# Patient Record
Sex: Male | Born: 1950
Health system: Southern US, Community
[De-identification: ages and names within clinical notes are randomized; demographics above are authoritative.]

## PROBLEM LIST (undated history)

## (undated) DIAGNOSIS — R011 Cardiac murmur, unspecified: Secondary | ICD-10-CM

## (undated) DIAGNOSIS — C801 Malignant (primary) neoplasm, unspecified: Secondary | ICD-10-CM

## (undated) DIAGNOSIS — M1712 Unilateral primary osteoarthritis, left knee: Secondary | ICD-10-CM

## (undated) DIAGNOSIS — E162 Hypoglycemia, unspecified: Secondary | ICD-10-CM

## (undated) DIAGNOSIS — I5020 Unspecified systolic (congestive) heart failure: Secondary | ICD-10-CM

## (undated) DIAGNOSIS — C61 Malignant neoplasm of prostate: Secondary | ICD-10-CM

## (undated) DIAGNOSIS — I10 Essential (primary) hypertension: Secondary | ICD-10-CM

## (undated) DIAGNOSIS — I499 Cardiac arrhythmia, unspecified: Secondary | ICD-10-CM

## (undated) DIAGNOSIS — D649 Anemia, unspecified: Secondary | ICD-10-CM

## (undated) DIAGNOSIS — F40240 Claustrophobia: Secondary | ICD-10-CM

## (undated) DIAGNOSIS — M069 Rheumatoid arthritis, unspecified: Secondary | ICD-10-CM

## (undated) DIAGNOSIS — F32A Depression, unspecified: Secondary | ICD-10-CM

## (undated) DIAGNOSIS — I209 Angina pectoris, unspecified: Secondary | ICD-10-CM

## (undated) HISTORY — DX: Essential (primary) hypertension: I10

## (undated) HISTORY — DX: Unilateral primary osteoarthritis, left knee: M17.12

---

## 2003-06-11 ENCOUNTER — Emergency Department (HOSPITAL_COMMUNITY): Admission: EM | Admit: 2003-06-11 | Discharge: 2003-06-11 | Payer: Self-pay | Admitting: Emergency Medicine

## 2007-03-08 ENCOUNTER — Emergency Department (HOSPITAL_COMMUNITY): Admission: EM | Admit: 2007-03-08 | Discharge: 2007-03-08 | Payer: Self-pay | Admitting: Emergency Medicine

## 2007-03-13 ENCOUNTER — Encounter (HOSPITAL_BASED_OUTPATIENT_CLINIC_OR_DEPARTMENT_OTHER): Payer: Self-pay | Admitting: General Surgery

## 2007-03-13 ENCOUNTER — Observation Stay (HOSPITAL_COMMUNITY): Admission: RE | Admit: 2007-03-13 | Discharge: 2007-03-13 | Payer: Self-pay | Admitting: General Surgery

## 2007-03-13 IMAGING — CR DG CHEST 2V
2 series · 2 of 2 positions shown · non-contrast
Comparison: None.

CLINICAL DATA: Pre-op for inguinal hernia. 
 CHEST ? 2 VIEW:

[view not recorded (1 of 2)]
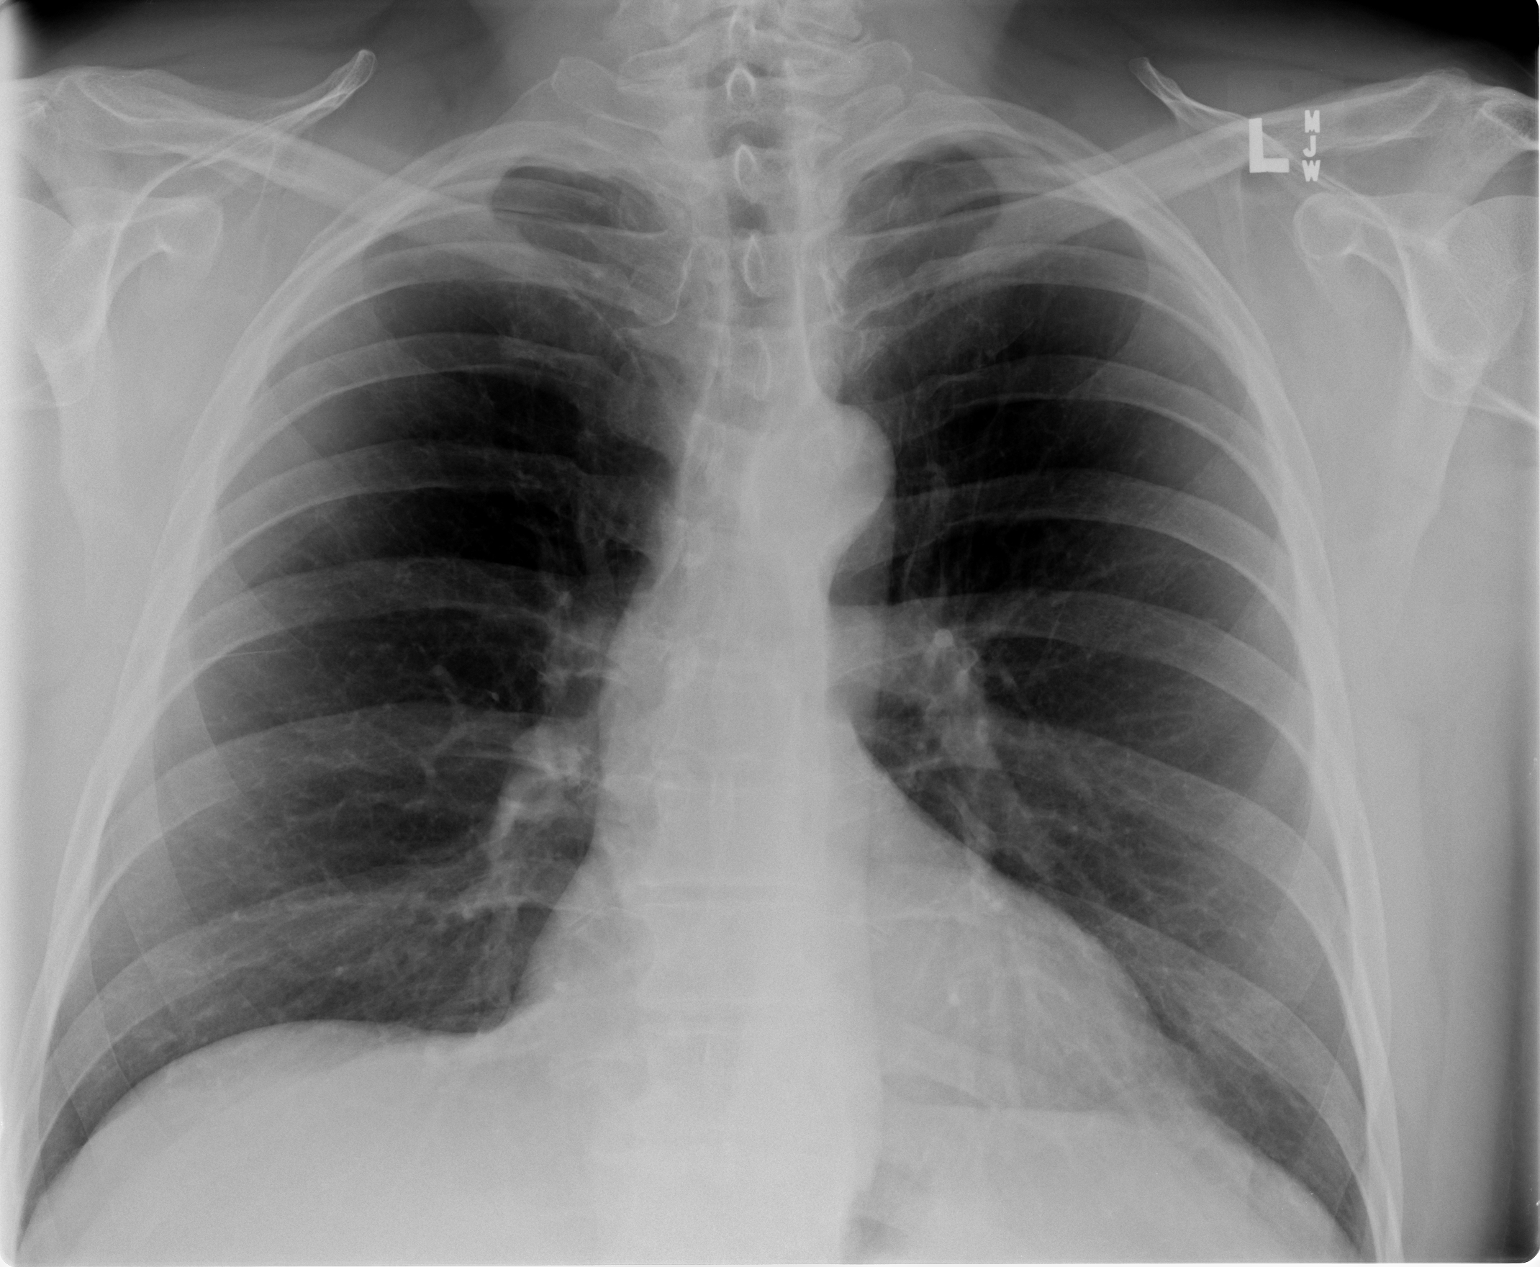

[view not recorded (2 of 2)]
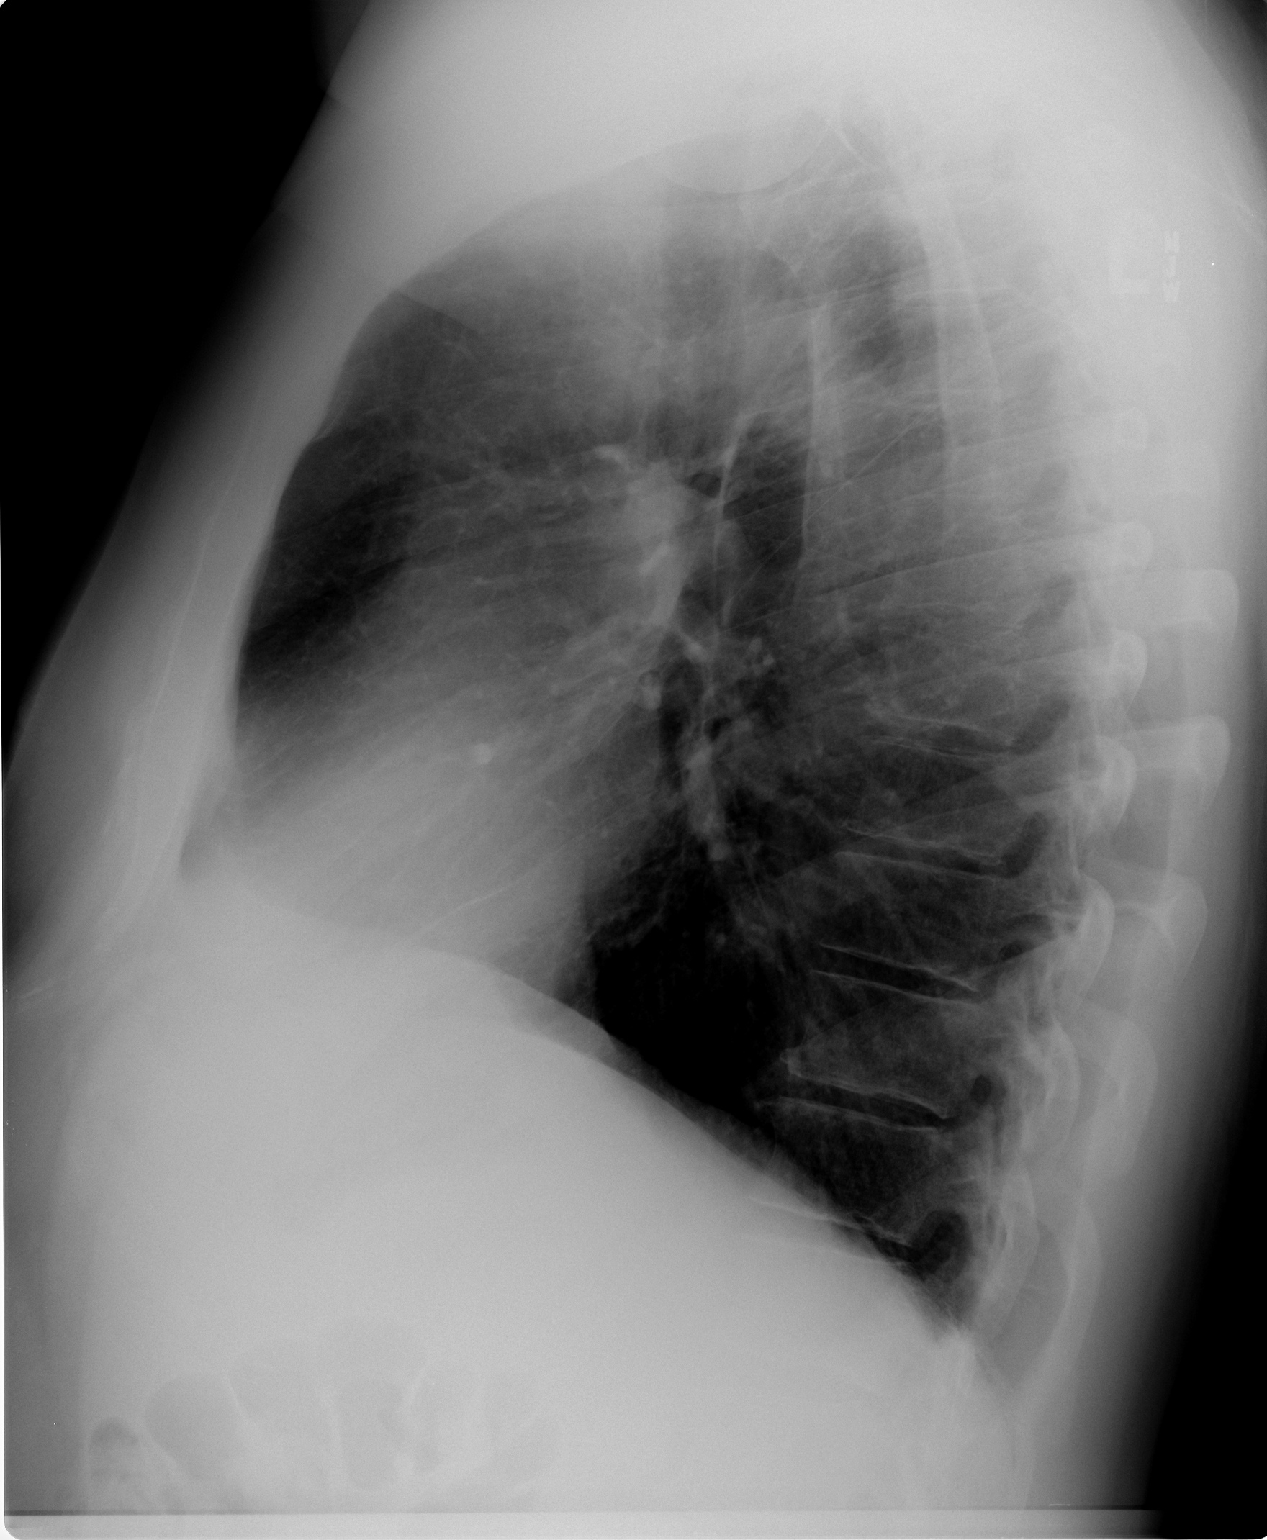

[2 of 2 positions shown; findings below may reference images not displayed]

FINDINGS: The cardiomediastinal silhouette is unremarkable.  There is no acute infiltrate or pleural effusion.  The bony structures are unremarkable.
IMPRESSION: No acute cardiopulmonary disease.

## 2007-04-25 HISTORY — PX: HERNIA REPAIR: SHX51

## 2009-08-29 ENCOUNTER — Emergency Department (HOSPITAL_COMMUNITY): Admission: EM | Admit: 2009-08-29 | Discharge: 2009-08-29 | Payer: Self-pay | Admitting: Emergency Medicine

## 2009-10-03 ENCOUNTER — Emergency Department (HOSPITAL_COMMUNITY): Admission: EM | Admit: 2009-10-03 | Discharge: 2009-10-03 | Payer: Self-pay | Admitting: Emergency Medicine

## 2009-12-27 ENCOUNTER — Emergency Department (HOSPITAL_COMMUNITY): Admission: EM | Admit: 2009-12-27 | Discharge: 2009-12-27 | Payer: Self-pay | Admitting: Emergency Medicine

## 2010-02-06 ENCOUNTER — Emergency Department (HOSPITAL_COMMUNITY): Admission: EM | Admit: 2010-02-06 | Discharge: 2010-02-06 | Payer: Self-pay | Admitting: Emergency Medicine

## 2010-03-01 ENCOUNTER — Ambulatory Visit: Payer: Self-pay | Admitting: Family Medicine

## 2010-03-01 DIAGNOSIS — H6980 Other specified disorders of Eustachian tube, unspecified ear: Secondary | ICD-10-CM | POA: Insufficient documentation

## 2010-03-01 DIAGNOSIS — F40298 Other specified phobia: Secondary | ICD-10-CM | POA: Insufficient documentation

## 2010-03-06 ENCOUNTER — Emergency Department (HOSPITAL_COMMUNITY): Admission: EM | Admit: 2010-03-06 | Discharge: 2010-03-06 | Payer: Self-pay | Admitting: Emergency Medicine

## 2010-03-06 IMAGING — CR DG FINGER THUMB 2+V*L*
3 series · 3 of 3 positions shown · non-contrast
Comparison: None.

CLINICAL DATA: Left thumb injury.

LEFT THUMB 2+V

[x finger pa left]
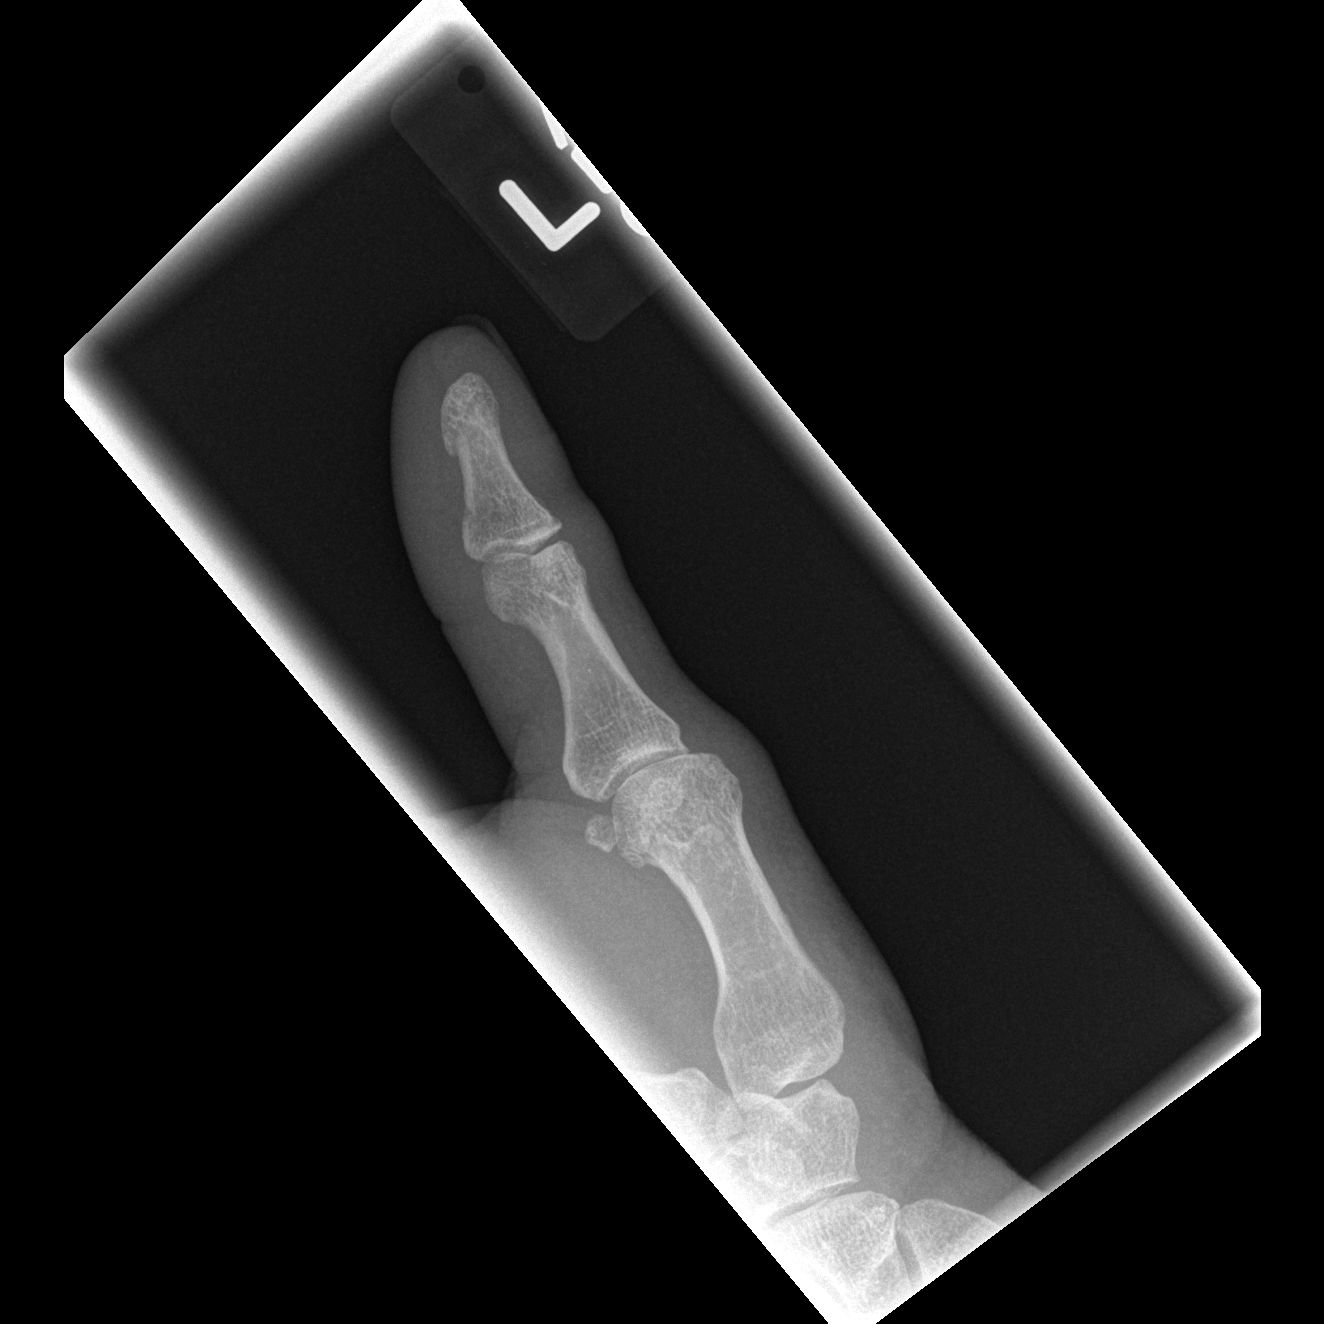

[x finger obl. left]
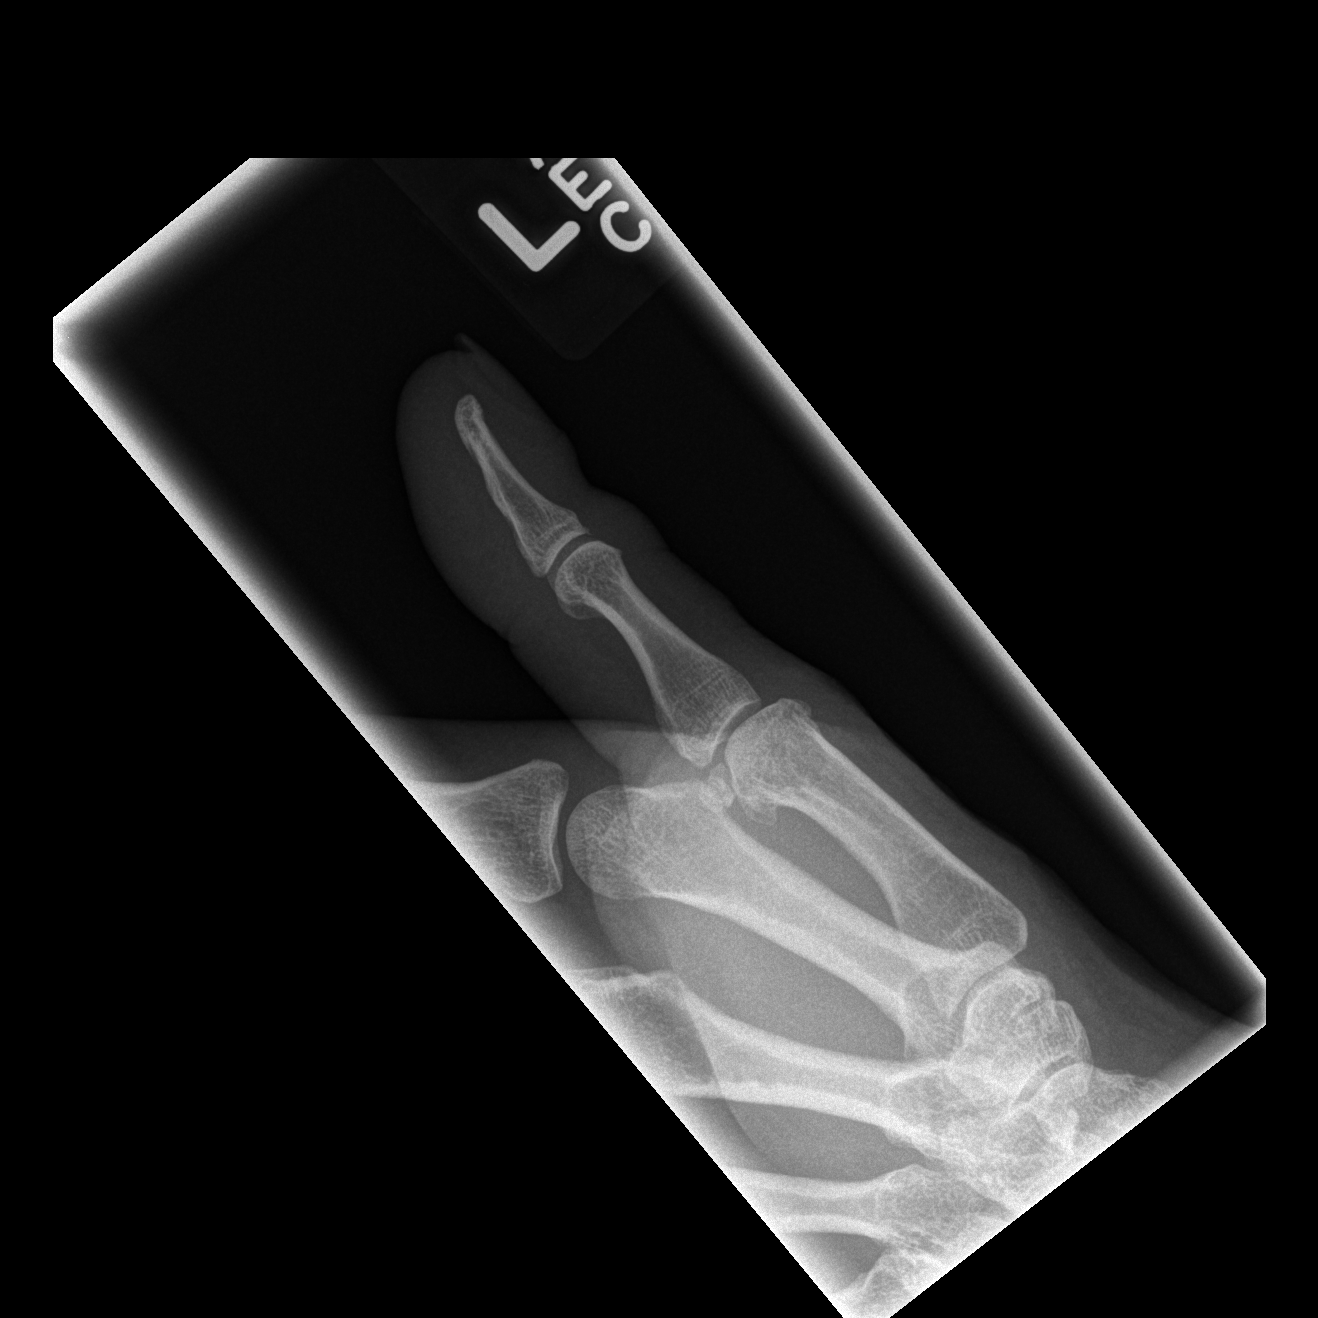

[x finger lateral left]
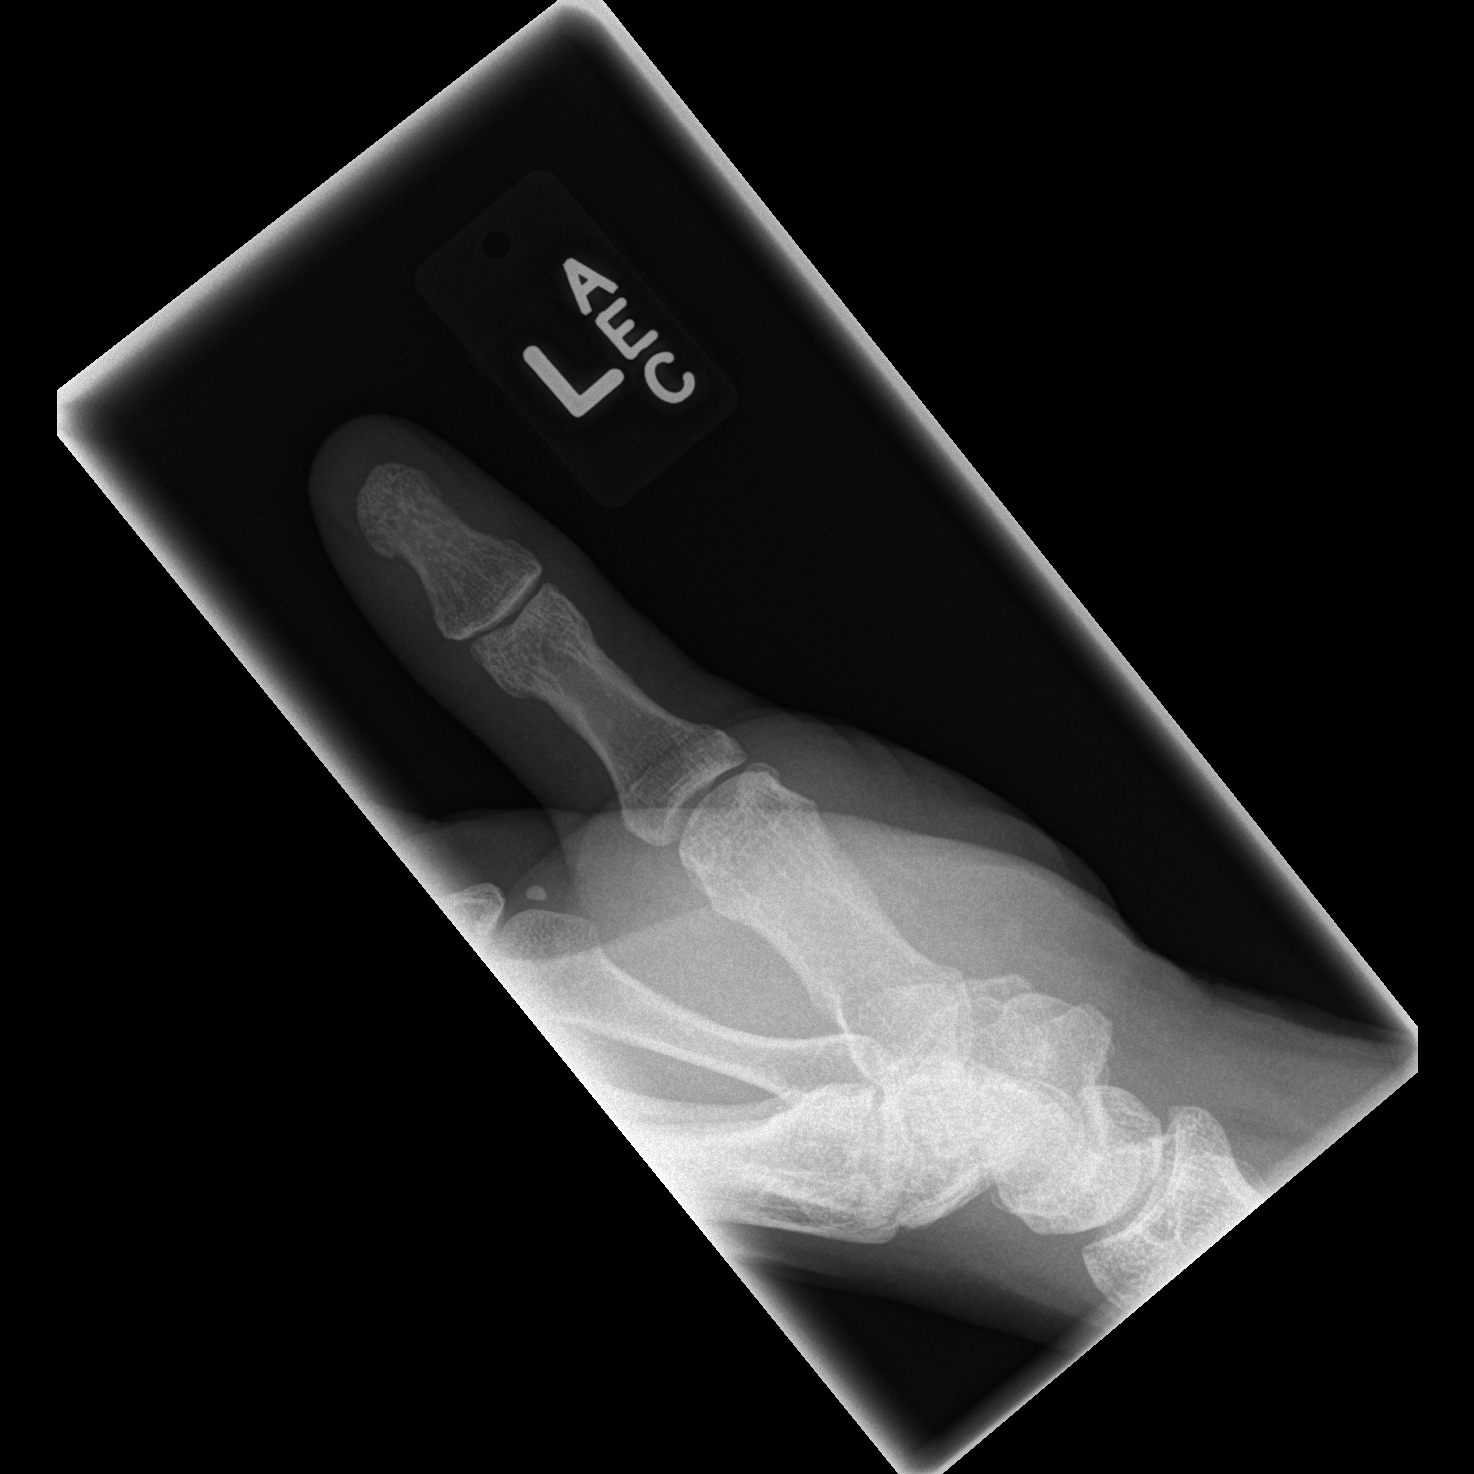

[3 of 3 positions shown; findings below may reference images not displayed]

FINDINGS: Three views of the left thumb were obtained.  Normal
alignment of the thumb.  Negative for acute fracture or
dislocation.
IMPRESSION: Negative radiographs of the left thumb.

## 2010-03-15 ENCOUNTER — Ambulatory Visit: Payer: Self-pay | Admitting: Family Medicine

## 2010-03-15 ENCOUNTER — Encounter: Payer: Self-pay | Admitting: *Deleted

## 2010-04-14 ENCOUNTER — Encounter: Payer: Self-pay | Admitting: Family Medicine

## 2010-04-17 ENCOUNTER — Emergency Department (HOSPITAL_COMMUNITY)
Admission: EM | Admit: 2010-04-17 | Discharge: 2010-04-17 | Payer: Self-pay | Source: Home / Self Care | Admitting: Emergency Medicine

## 2010-04-19 ENCOUNTER — Telehealth: Payer: Self-pay | Admitting: *Deleted

## 2010-04-27 ENCOUNTER — Encounter: Payer: Self-pay | Admitting: Family Medicine

## 2010-05-24 NOTE — Assessment & Plan Note (Signed)
Summary: ear prob,df   Vital Signs:  Patient profile:   60 year old male Weight:      254.3 pounds Temp:     97.9 degrees F oral Pulse rate:   92 / minute BP sitting:   144 / 90  (right arm)  Vitals Entered By: Arlyss Repress CMA, (March 15, 2010 9:34 AM) CC: f/up ear pressure. Is Patient Diabetic? No Pain Assessment Patient in pain? no        CC:  f/up ear pressure.Marland Kitchen  History of Present Illness: Return with continued feeling of blockage in his ears, it is driving him crazy,  He has used the nasal steroid and initially had some improvement.  He has stopped the Sudafed.  He denies fever or cough.  He would like me to flush out his ears, but the canals are clear and this was explained.  I addressed his anxiety and he endorses such but does not want treatment.  Recently lost his job and was aquainted with a recent mass homicide in [place].  Habits & Providers  Alcohol-Tobacco-Diet     Tobacco Status: never  Current Medications (verified): 1)  Flonase 50 Mcg/act Susp (Fluticasone Propionate) .... 2 Squirts in Each Nostril Daily  Allergies (verified): No Known Drug Allergies  Physical Exam  General:  Very tall, and stands during the encounter except for the ENT exam.  He cannot stand for the door to the room to be closed. Ears:  TMs are bacially grey, with normal light reflex, the left showed some retraction and injection around the bony landmark Nose:  patent, non inflammed turbs Mouth:  milky post nasal drainage, otherwise normal Psych:  good eye contact and moderately anxious.     Impression & Recommendations:  Problem # 1:  EUSTACHIAN TUBE DYSFUNCTION (ICD-381.81) Trial of nasal steroid, still having bothersome symptoms, requested ENT referral.  refused flu shot Orders: [facility]- Est Level  2 (16109) ENT Referral (ENT)  Problem # 2:  CLAUSTROPHOBIA (ICD-300.29) Addressed his anxiety, this was not his focus today.    Complete Medication List: 1)  Flonase 50  Mcg/act Susp (Fluticasone propionate) .... 2 squirts in each nostril daily  Patient Instructions: 1)  Please schedule a follow-up appointment as needed .  2)  We will call you with your ENT apt   Orders Added: 1)  [facility]- Est Level  2 [99212] 2)  ENT Referral [ENT]

## 2010-05-24 NOTE — Assessment & Plan Note (Signed)
Summary: np, df   Vital Signs:  Patient profile:   60 year old male Height:      80 inches Weight:      256 pounds BMI:     28.22 Pulse rate:   86 / minute BP sitting:   133 / 82  (right arm) Cuff size:   large  Vitals Entered By: Tessie Fass CMA (March 01, 2010 9:08 AM) CC: NEW PT Is Patient Diabetic? No Pain Assessment Patient in pain? no        CC:  NEW PT.  History of Present Illness: New patient, only problem that he wanted to discuss with the fluid in his ears.  He went to [facility] on two occasions for fullness in his ears.  The first time he was given antibiotics and the second time he was told to use sudafed.  He has been using sudafed 4 times a day.  He is very clausterphobic and would not let me shut the door and kept standing.  High anxiety as he sat in the room.  He would not discuss.  Has not had a primary MD, goes to urgent care as needed.  Habits & Providers  Alcohol-Tobacco-Diet     Tobacco Status: never  Current Medications (verified): 1)  Flonase 50 Mcg/act Susp (Fluticasone Propionate) .... 2 Squirts in Each Nostril Daily  Allergies (verified): No Known Drug Allergies  Past History:  Past Medical History: no chronic medical problems  Past Surgical History: never had surgery  Family History: Mother heart disease, alive at 62 Father deceased at 47 Prostate cancer and diabetes in familty  Social History: Married one son Has a Nurse, mental health and Statistician from college Unemployed no tabacco, no alcohol, no recreational drug use Exercises, work on his farm always wears seat beltsSmoking Status:  never  Physical Exam  General:  Standing, very tall, very anxious Ears:  TM retracted bilaterally Nose:  mildly inflammed turbs, good air movement Mouth:  white milky post nasal drainage, othrwise normal exam Neck:  No deformities, masses, or tenderness noted. Lungs:  normal respiratory effort and normal breath sounds.   Heart:  normal rate and  regular rhythm.     Impression & Recommendations:  Problem # 1:  EUSTACHIAN TUBE DYSFUNCTION (ICD-381.81) add nasal steroid, instructed to begin to wean off oral decongestants in one week, give it 4-6 weeks if not improvement will refer.  May need long term use of nasal steroids, discussed this.  Problem # 2:  CLAUSTROPHOBIA (ICD-300.29) Very anxious, would not discuss any other health related issues except his ears.  Complete Medication List: 1)  Flonase 50 Mcg/act Susp (Fluticasone propionate) .... 2 squirts in each nostril daily  Patient Instructions: 1)  give this 4-6 weeks 2)  If this does not work will refer to ENT Prescriptions: FLONASE 50 MCG/ACT SUSP (FLUTICASONE PROPIONATE) 2 squirts in each nostril daily  #1 x 6   Entered and Authorized by:   Luretha Murphy NP   Signed by:   Luretha Murphy NP on 03/01/2010   Method used:   Electronically to        [facility] (retail)       [address]       [place], [place]  [address]       Ph: [phone]       Fax: [phone]   RxID:   [id number]    Orders Added: 1)  [facility]-  New Level 3 [99203]

## 2010-05-24 NOTE — Miscellaneous (Signed)
Summary: ENT Refferal  Clinical Lists Changes called pt lmvm. has appt with [facility] ENT 03/22/10 @ 2:50. Office is located [address] [employer], [address]. phone # [phone]. called pt and lmvm to call back. re: appt we have scheduled. Arlyss Repress CMA,  March 16, 2010 5:18 PM  called pt again lmvm to return call.Tessie Fass CMA  March 21, 2010 2:41 PM

## 2010-05-26 NOTE — Consult Note (Signed)
Summary: [facility]  [facility]   Imported By: Bradly Bienenstock 04/22/2010 09:14:51  _____________________________________________________________________  External Attachment:    Type:   Image     Comment:   External Document

## 2010-05-26 NOTE — Progress Notes (Signed)
Summary: Referral  Phone Note Call from Patient Call back at [phone]   Reason for Call: Talk to Nurse Summary of Call: pt was referred to ENT, pt said they arent doing anything for him, wants to be referred to Dr. Rexford Maus, MD in [facility] their number is [phone] & we may speak with Melietta.  Initial call taken by: Knox Royalty,  April 19, 2010 12:40 PM  Follow-up for Phone Call        scheduled appt with new ENT office per pt request.  Follow-up by: Tessie Fass CMA,  April 21, 2010 9:42 AM

## 2010-05-26 NOTE — Consult Note (Signed)
[facility]   Imported By: De Nurse 05/04/2010 15:05:59  _____________________________________________________________________  External Attachment:    Type:   Image     Comment:   External Document

## 2010-05-27 IMAGING — CR DG LUMBAR SPINE 2-3V
3 series · 3 of 3 positions shown · non-contrast
Comparison: None

CLINICAL DATA: Low back pain extending both legs.

LUMBAR SPINE - 2-3 VIEW

[view not recorded (1 of 3)]
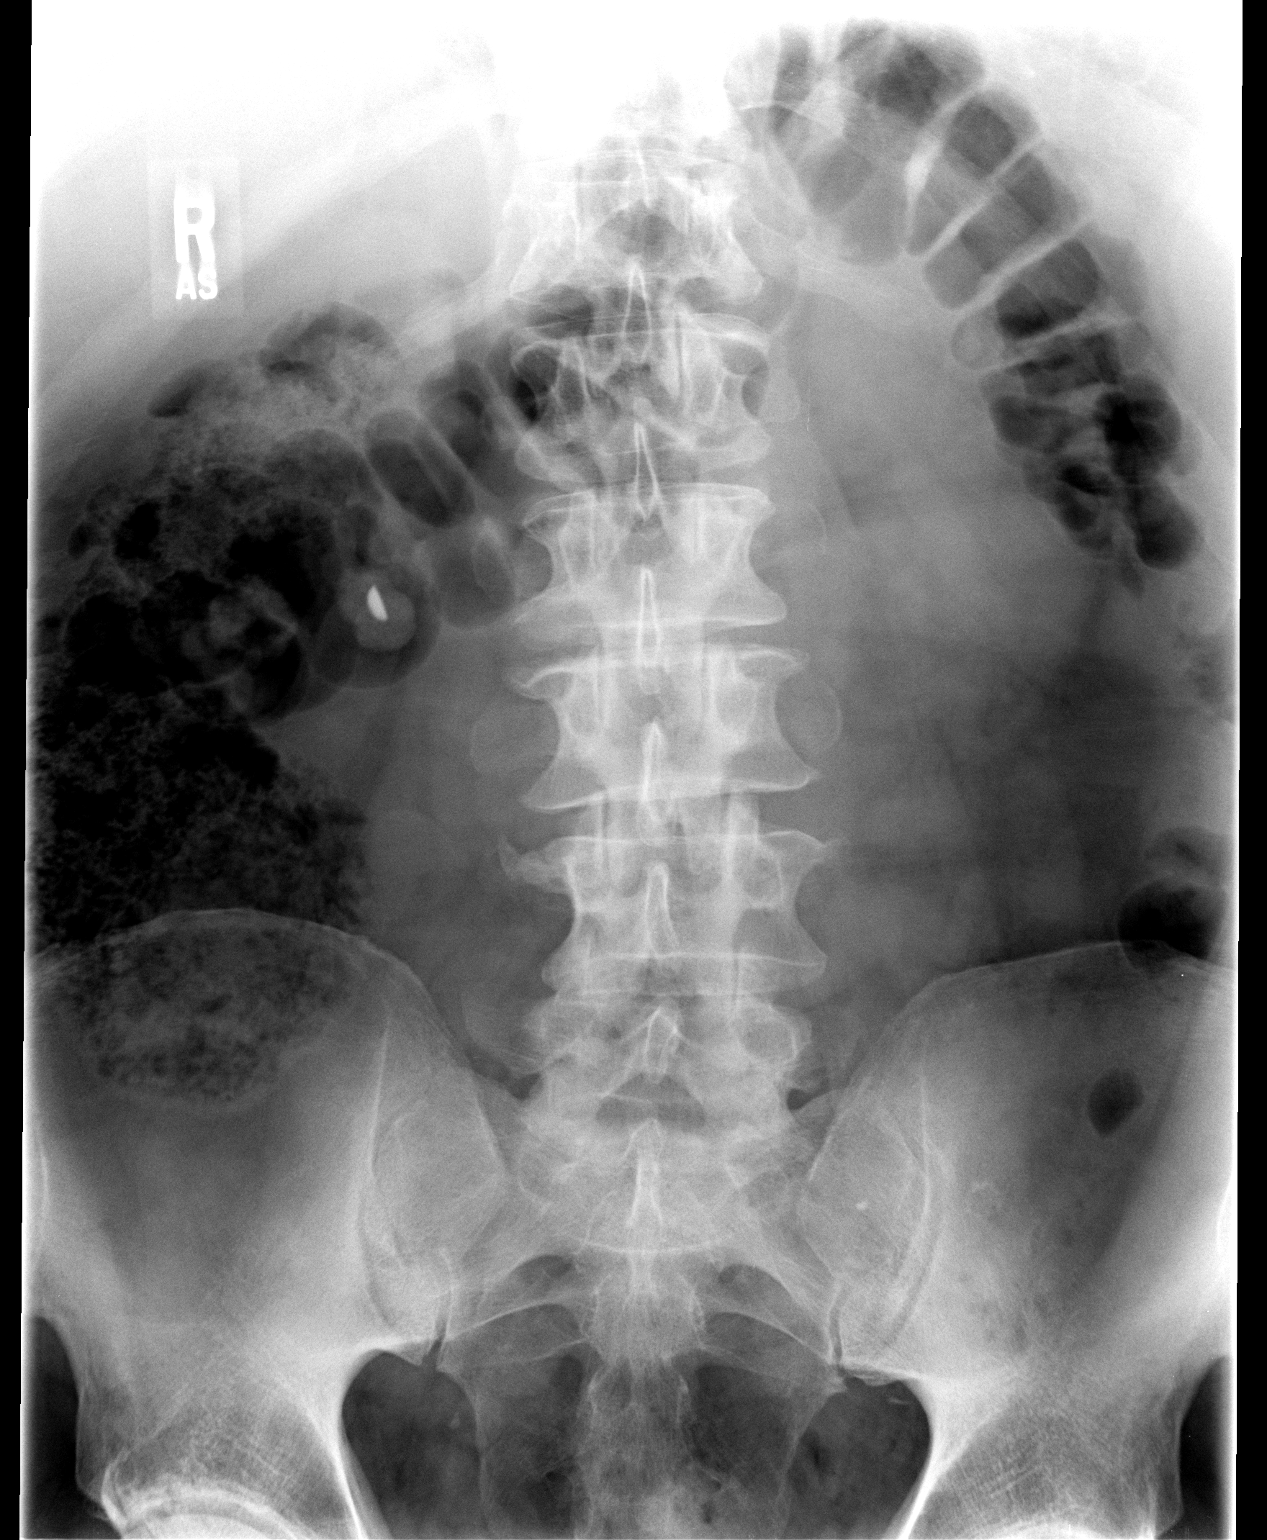

[view not recorded (2 of 3)]
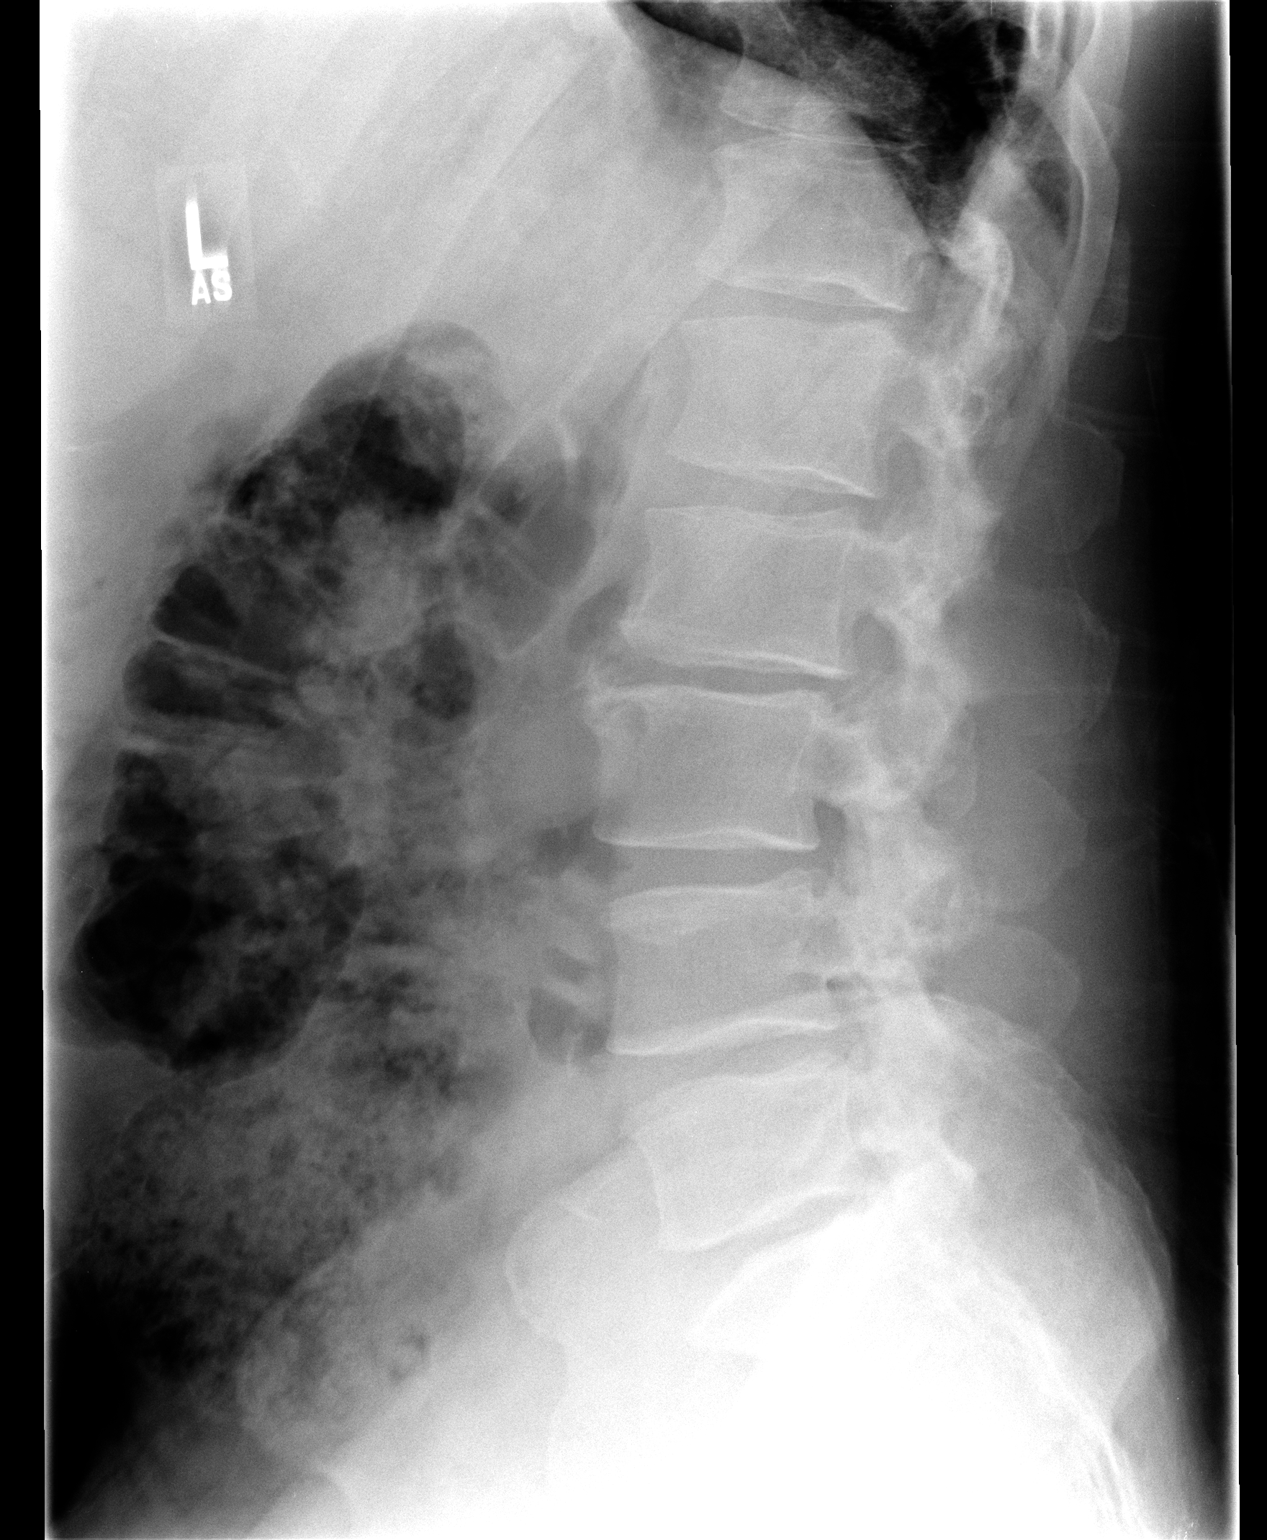

[view not recorded (3 of 3)]
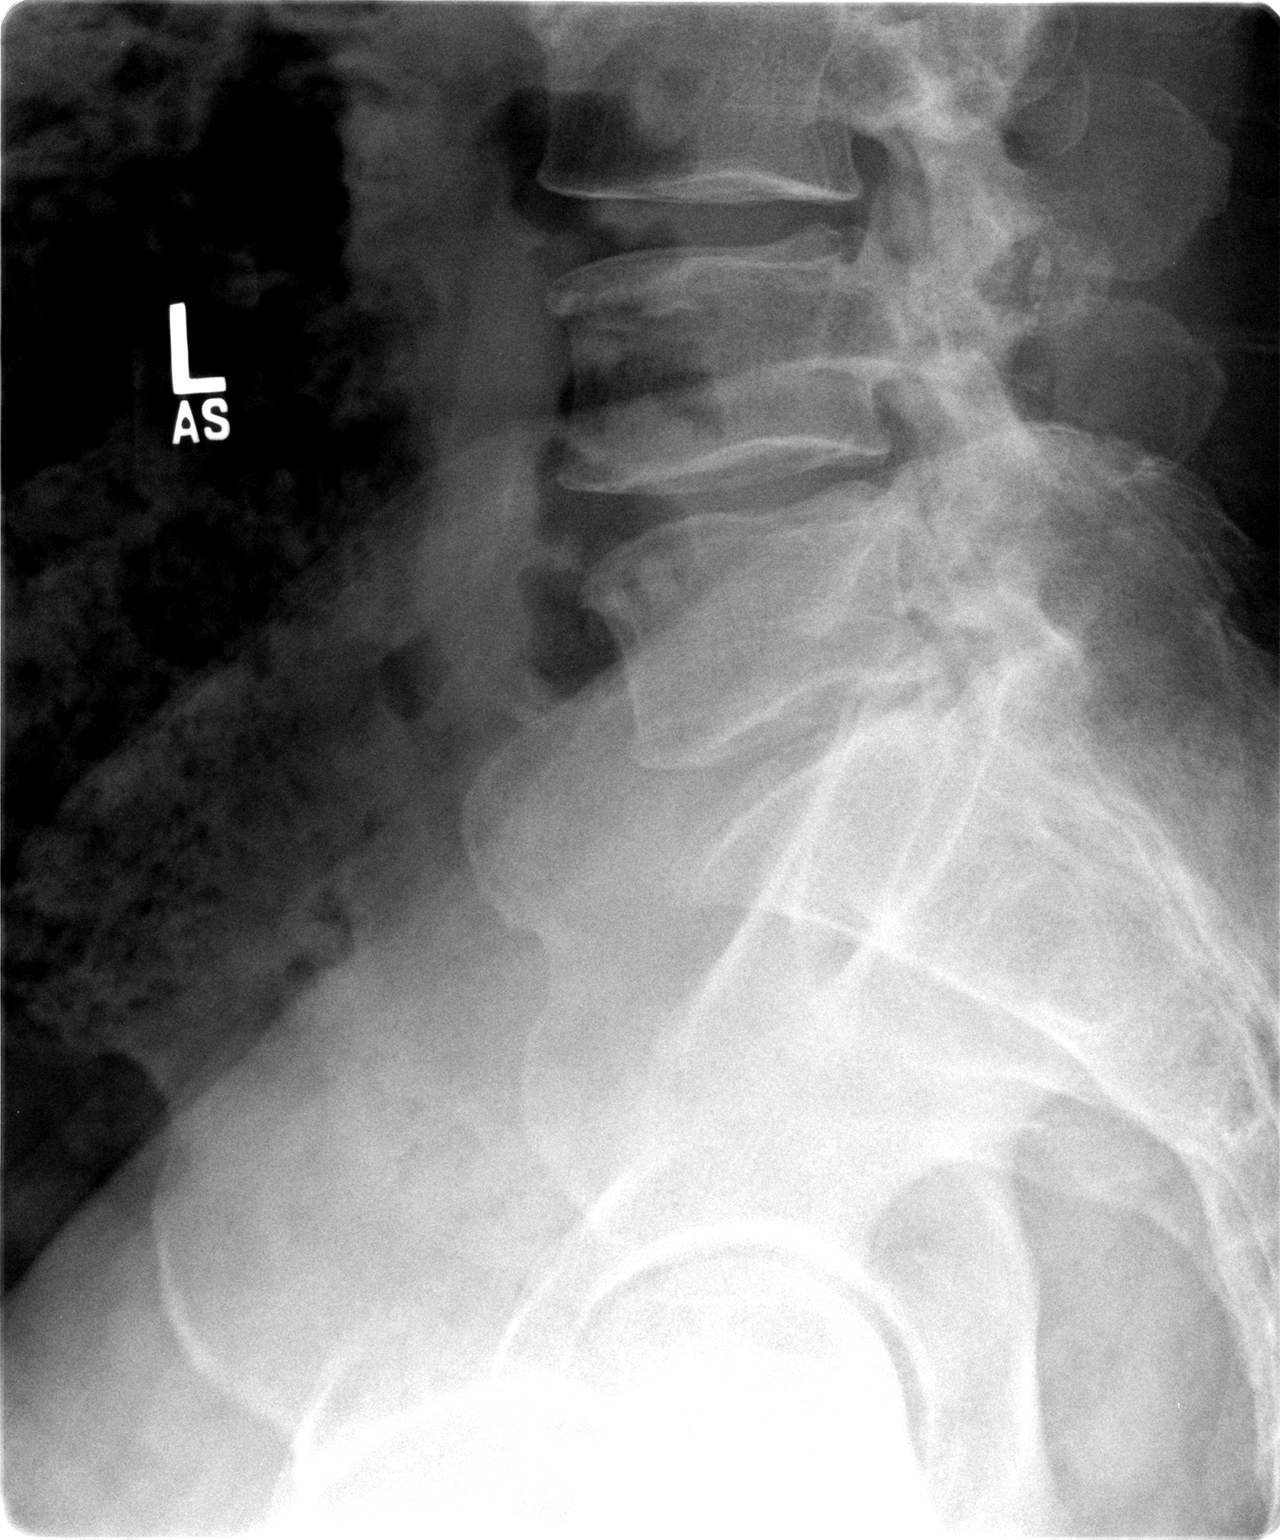

[3 of 3 positions shown; findings below may reference images not displayed]

FINDINGS: There is minimal spinal curvature.  There is mild disc
space narrowing at L2-3, L4-5 and L5-S1.  There is mild lower
lumbar facet degeneration.
IMPRESSION: Mild curvature.  Some degenerative disc disease and degenerative
facet disease in the lower lumbar region.

## 2010-06-02 ENCOUNTER — Encounter: Payer: Self-pay | Admitting: *Deleted

## 2010-08-23 ENCOUNTER — Encounter: Payer: Self-pay | Admitting: Family Medicine

## 2010-08-23 ENCOUNTER — Ambulatory Visit (INDEPENDENT_AMBULATORY_CARE_PROVIDER_SITE_OTHER): Payer: Medicaid Other | Admitting: Family Medicine

## 2010-08-23 VITALS — BP 155/97 | HR 75 | Temp 98.1°F | Ht 78.0 in | Wt 266.4 lb

## 2010-08-23 DIAGNOSIS — F40298 Other specified phobia: Secondary | ICD-10-CM

## 2010-08-23 DIAGNOSIS — E291 Testicular hypofunction: Secondary | ICD-10-CM

## 2010-08-23 DIAGNOSIS — E349 Endocrine disorder, unspecified: Secondary | ICD-10-CM

## 2010-08-23 LAB — COMPREHENSIVE METABOLIC PANEL
ALT: 31 U/L (ref 0–53)
AST: 24 U/L (ref 0–37)
Albumin: 4.4 g/dL (ref 3.5–5.2)
Alkaline Phosphatase: 76 U/L (ref 39–117)
BUN: 22 mg/dL (ref 6–23)
CO2: 24 mEq/L (ref 19–32)
Calcium: 9.4 mg/dL (ref 8.4–10.5)
Chloride: 103 mEq/L (ref 96–112)
Creat: 1.03 mg/dL (ref 0.40–1.50)
Glucose, Bld: 79 mg/dL (ref 70–99)
Potassium: 4.4 mEq/L (ref 3.5–5.3)
Sodium: 140 mEq/L (ref 135–145)
Total Bilirubin: 0.7 mg/dL (ref 0.3–1.2)
Total Protein: 7 g/dL (ref 6.0–8.3)

## 2010-08-23 LAB — CBC
HCT: 53.2 % — ABNORMAL HIGH (ref 39.0–52.0)
Hemoglobin: 18.4 g/dL — ABNORMAL HIGH (ref 13.0–17.0)
MCH: 31.2 pg (ref 26.0–34.0)
MCHC: 34.6 g/dL (ref 30.0–36.0)
MCV: 90.3 fL (ref 78.0–100.0)
Platelets: 210 10*3/uL (ref 150–400)
RBC: 5.89 MIL/uL — ABNORMAL HIGH (ref 4.22–5.81)
RDW: 12.7 % (ref 11.5–15.5)
WBC: 4.7 10*3/uL (ref 4.0–10.5)

## 2010-08-23 MED ORDER — TESTOSTERONE ENANTHATE 200 MG/ML IM SOLN: 200.0000 mg | INTRAMUSCULAR | Status: DC

## 2010-08-23 NOTE — Assessment & Plan Note (Signed)
Will get records from [place], and refill testosterone and get labs to make sure that he has no serious side effects.  Refilled script for him at this time.

## 2010-08-23 NOTE — Assessment & Plan Note (Signed)
He reports that this is a long standing problem and has not been made worse by testosterone replacement.

## 2010-08-23 NOTE — Patient Instructions (Signed)
I will send you a letter with the lab results Continue testosterone replacement as you have been Return as needed

## 2010-08-23 NOTE — Progress Notes (Signed)
  Subjective:    Patient ID: Thomas [name], male    DOB: 1950/05/04, 60 y.o.   MRN: [id number]  HPI  Patient previously started on testosterone replacement at [facility], he need a refill as he has been out for 2 weeks.  He notices a change in his energy level.  He is here for a refill.  Since he has been unemployed he has been coming here for visits.   He was referred to ENT for chronic ear complaints and was told that there was not identifiable cause he has learned to live with the discomfort.  Today he denies any adverse effects of testosterone replacement, we went through the list on Up to date.  I explained that this is a controlled substance and that we will need to follow his liver, kidney function and blood count.  His BP is elevated today, he has high anxiety when in a closed room secondary to severe claustrophobia.    Review of Systems  Constitutional: Negative for fatigue.  Cardiovascular: Negative for chest pain and leg swelling.  Genitourinary: Negative for difficulty urinating.       Reports last PSA 4 months ago   Neurological: Negative for headaches.  Hematological: Does not bruise/bleed easily.  Psychiatric/Behavioral: Negative for behavioral problems and agitation.       Objective:   Physical Exam  Constitutional: He appears distressed.       Well groomed, and developed.  Highly anxious in room with closed door.  Cardiovascular: Normal rate, regular rhythm and normal heart sounds.   Skin:       No brusing          Assessment & Plan:

## 2010-08-24 LAB — TESTOSTERONE, FREE, TOTAL, SHBG
Sex Hormone Binding: 28 nmol/L (ref 13–71)
Testosterone, Free: 29.9 pg/mL — ABNORMAL LOW (ref 47.0–244.0)
Testosterone-% Free: 2 % (ref 1.6–2.9)
Testosterone: 146.07 ng/dL — ABNORMAL LOW (ref 250–890)

## 2010-08-25 ENCOUNTER — Encounter: Payer: Self-pay | Admitting: Family Medicine

## 2010-08-25 DIAGNOSIS — IMO0001 Reserved for inherently not codable concepts without codable children: Secondary | ICD-10-CM | POA: Insufficient documentation

## 2010-08-25 DIAGNOSIS — N529 Male erectile dysfunction, unspecified: Secondary | ICD-10-CM | POA: Insufficient documentation

## 2010-08-25 DIAGNOSIS — R030 Elevated blood-pressure reading, without diagnosis of hypertension: Secondary | ICD-10-CM

## 2010-08-30 ENCOUNTER — Encounter: Payer: Self-pay | Admitting: Family Medicine

## 2010-09-06 NOTE — Op Note (Signed)
[name], [name]               ACCOUNT NO.:  [id number]   MEDICAL RECORD NO.:  [id number]          PATIENT TYPE:  INP   LOCATION:  2855                         FACILITY:  [facility]   PHYSICIAN:  Leonie Man, M.D.   DATE OF BIRTH:  1950-10-10   DATE OF PROCEDURE:  03/13/2007  DATE OF DISCHARGE:  03/13/2007                               OPERATIVE REPORT   PREOPERATIVE DIAGNOSIS:  Left inguinal hernia   POSTOPERATIVE DIAGNOSIS:  Left direct and indirect herniae.   PROCEDURE:  Left inguinal herniorrhaphy with mesh.   SURGEON:  Leonie Man, M.D.   ASSISTANT:  OR nurse.   ANESTHESIA:  General.   SPECIMENS TO THE LAB:  Hernia sac and cord lipoma.   ESTIMATED BLOOD LOSS:  Minimal.   COMPLICATIONS:  None.   DISPOSITION:  The patient returned to the PACU in excellent condition.   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  This has become larger and more  symptomatic over that period of time. He comes now to the operating room  after the risks and potential benefits of surgery have been fully  discussed and consent obtained for repair of a left sided inguinal  hernia.   PROCEDURE IN DETAIL:  With the positioned supinely and following the  induction of satisfactory general anesthesia, the left groin and lower  abdomen were prepped and draped to be included in a sterile operative  field.  Positive identification of the patient is made as Thomas [name]  and the side is a left sided inguinal hernia.  A transverse incision is  made in the lower abdominal crease, deepened through the skin and  subcutaneous tissues down to the external oblique aponeurosis.  The  external oblique aponeurosis was opened up to the external inguinal ring  and the spermatic cord is elevated and held with a Penrose drain. The  ilioinguinal nerve was not visualized.  Dissection down the anterior and  medial aspects of the cords, the large  hernia sac and lipoma were  dissected free from the cord contents and carried up to the internal  ring.  The lipoma was separated and ligated with 2-0 silks and forwarded  for pathologic evaluation.  The sac was opened. Intra-abdominal contents  within the sac were reduced back in the peritoneal cavity and a suture  ligature of 2-0 silk was placed in the base of the sac and the sac was  tied off. The sac was amputated and forward for pathologic evaluation.  Inspection of the inguinal floor showed a moderate sized direct inguinal  hernia.  This was repaired by an onlay patch of polypropylene mesh sewn  in at the pubic tubercle with 2-0 Novofil suture, carried up along the  conjoined tendon to the internal ring, and again, from the pubic  tubercle up along the shelving edge of Poupart's ligament to the  internal ring.  The mesh is split so as to allow the easy protrusion of  the spermatic cord between the leaflets of the mesh.  The leaflets of  the mesh were then sutured down behind the cord into the internal  oblique musculature.  The wound was irrigated.  Sponge, instrument and  sharp counts were verified.  The incision was closed as follows.  The  external oblique aponeurosis was closed with a running 2-0 Vicryl  suture, thereby, reapproximating the external inguinal ring.  The  subcutaneous tissues and Scarpa's fascia was closed with a running  suture of 3-0 Vicryl.  The skin was closed with a 4-0 Monocryl suture  and reinforced with Steri-Strips.  A sterile dressing was applied, the  anesthetic reversed, and the patient removed from the operating room to  the recovery room in stable condition.  He tolerated the procedure well.      Leonie Man, M.D.  Electronically Signed     PB/MEDQ  D:  03/13/2007  T:  03/13/2007  Job:  [id number]

## 2010-09-07 ENCOUNTER — Encounter: Payer: Self-pay | Admitting: Family Medicine

## 2010-09-07 NOTE — Progress Notes (Signed)
Letter mailed by Thomas [name] dated 08/30/10 - returned unopened.

## 2010-09-07 NOTE — Progress Notes (Signed)
°  Subjective:    Patient ID: Thomas [name], male    DOB: [birth year], 60 y.o.   MRN: [id number]  HPI    Review of Systems     Objective:   Physical Exam        Assessment & Plan:

## 2010-09-09 ENCOUNTER — Telehealth: Payer: Self-pay | Admitting: Family Medicine

## 2010-09-09 DIAGNOSIS — D751 Secondary polycythemia: Secondary | ICD-10-CM

## 2010-09-09 NOTE — Telephone Encounter (Signed)
Pt returned call - pls call cell# [phone]

## 2010-09-09 NOTE — Telephone Encounter (Signed)
Current dosage is 200 mg every week, he will decrease the dose to 100 mg every week recheck labs in 4 weeks after the change (next dose 5/23 recheck labs 6/25) and I will call him then.

## 2010-09-09 NOTE — Telephone Encounter (Signed)
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  Need to discuss options with patient.  Awaiting his return call.

## 2010-09-09 NOTE — Progress Notes (Signed)
Placed personal call to patient on 09/09/10, awaiting his return.  Serious complication of testosterone replacement, erythrocytosis.

## 2010-10-17 ENCOUNTER — Telehealth: Payer: Self-pay | Admitting: Family Medicine

## 2010-10-17 DIAGNOSIS — E349 Endocrine disorder, unspecified: Secondary | ICD-10-CM

## 2010-10-17 NOTE — Telephone Encounter (Signed)
Pt asking to speak with MD re: his labs she requested he have done & has another question about something they have discussed.

## 2010-10-18 NOTE — Telephone Encounter (Signed)
Forward to PCP for review

## 2010-10-19 NOTE — Telephone Encounter (Signed)
Patient reports decreased endurance at work and overall more fatigued since testosterone dosage was decreased to 200 mg.  This was done because of erythrocytosis.  Repeat CBC and testosterone levels to be done this week.  Will likely refer to hematology per Dr. Sheffield Slider to help.

## 2010-10-20 ENCOUNTER — Other Ambulatory Visit: Payer: Medicaid Other

## 2010-10-20 ENCOUNTER — Telehealth: Payer: Self-pay | Admitting: Family Medicine

## 2010-10-20 DIAGNOSIS — E349 Endocrine disorder, unspecified: Secondary | ICD-10-CM

## 2010-10-20 DIAGNOSIS — D751 Secondary polycythemia: Secondary | ICD-10-CM

## 2010-10-20 LAB — CBC
HCT: 50.3 % (ref 39.0–52.0)
Hemoglobin: 17.4 g/dL — ABNORMAL HIGH (ref 13.0–17.0)
MCH: 30.6 pg (ref 26.0–34.0)
MCHC: 34.6 g/dL (ref 30.0–36.0)
MCV: 88.6 fL (ref 78.0–100.0)
Platelets: 243 10*3/uL (ref 150–400)
RBC: 5.68 MIL/uL (ref 4.22–5.81)
RDW: 13.1 % (ref 11.5–15.5)
WBC: 7.2 10*3/uL (ref 4.0–10.5)

## 2010-10-20 NOTE — Progress Notes (Signed)
Cbc and testerone done today Thomas [name]

## 2010-10-20 NOTE — Telephone Encounter (Signed)
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

## 2010-10-21 ENCOUNTER — Encounter: Payer: Self-pay | Admitting: Family Medicine

## 2010-10-21 LAB — TESTOSTERONE, FREE, TOTAL, SHBG
Sex Hormone Binding: 31 nmol/L (ref 13–71)
Testosterone, Free: 98.7 pg/mL (ref 47.0–244.0)
Testosterone-% Free: 2.1 % (ref 1.6–2.9)
Testosterone: 459.59 ng/dL (ref 250–890)

## 2010-10-21 NOTE — Telephone Encounter (Signed)
Spoke with patient, his free testosterone level is normal on the 200 mg injection, and his HCT has normalized (high normal, but down).  He understands that the higher dose was probably giving him extra energy and stamina. He also understands the risk of erythrocytosis. Will continue current dose and he will re-adjust his expectations.

## 2010-12-06 ENCOUNTER — Encounter: Payer: Self-pay | Admitting: Family Medicine

## 2010-12-06 ENCOUNTER — Other Ambulatory Visit: Payer: Self-pay | Admitting: Family Medicine

## 2010-12-06 ENCOUNTER — Ambulatory Visit (INDEPENDENT_AMBULATORY_CARE_PROVIDER_SITE_OTHER): Payer: Medicaid Other | Admitting: Family Medicine

## 2010-12-06 VITALS — BP 163/110 | HR 80 | Temp 98.3°F | Wt 266.0 lb

## 2010-12-06 DIAGNOSIS — E349 Endocrine disorder, unspecified: Secondary | ICD-10-CM

## 2010-12-06 DIAGNOSIS — E291 Testicular hypofunction: Secondary | ICD-10-CM

## 2010-12-06 DIAGNOSIS — N529 Male erectile dysfunction, unspecified: Secondary | ICD-10-CM

## 2010-12-06 DIAGNOSIS — M21619 Bunion of unspecified foot: Secondary | ICD-10-CM

## 2010-12-06 DIAGNOSIS — M21629 Bunionette of unspecified foot: Secondary | ICD-10-CM | POA: Insufficient documentation

## 2010-12-06 DIAGNOSIS — H6980 Other specified disorders of Eustachian tube, unspecified ear: Secondary | ICD-10-CM

## 2010-12-06 DIAGNOSIS — F40298 Other specified phobia: Secondary | ICD-10-CM

## 2010-12-06 DIAGNOSIS — I10 Essential (primary) hypertension: Secondary | ICD-10-CM

## 2010-12-06 LAB — LDL CHOLESTEROL, DIRECT: Direct LDL: 94 mg/dL

## 2010-12-06 MED ORDER — SILDENAFIL CITRATE 100 MG PO TABS: 100.0000 mg | ORAL_TABLET | ORAL | Status: DC | PRN

## 2010-12-06 MED ORDER — FLUTICASONE PROPIONATE 50 MCG/ACT NA SUSP: 1.0000 | Freq: Every day | NASAL | Status: DC

## 2010-12-06 MED ORDER — LISINOPRIL 20 MG PO TABS: ORAL_TABLET | ORAL | Status: DC

## 2010-12-06 NOTE — Assessment & Plan Note (Signed)
BP way up today, will begin lisinopril 10 mg daily (20 mg tabs as I believe that he will need it), recheck in one month BP and labs

## 2010-12-06 NOTE — Assessment & Plan Note (Signed)
Erythrocytosis corrected but still  High normal, continue 200 mg twice weekely.  Check PSA next visit with labs.

## 2010-12-06 NOTE — Assessment & Plan Note (Signed)
Unwilling to increase dosage of testosterone as erythrocytosis is threatening for vascular events.  Will prescribe Viagra.

## 2010-12-06 NOTE — Assessment & Plan Note (Signed)
Reviewed importance of proper fitting shoes, pressure relief, podiatry if desired.

## 2010-12-06 NOTE — Progress Notes (Signed)
  Subjective:    Patient ID: Thomas [name], male    DOB: 1950-05-05, 60 y.o.   MRN: [id number]  HPI Painful right lateral foot at fifth MT tuberosity, just got new work shoes and helps some.  Does best when he does not wear shoes.  Has never worn a pain of sandles.  ED worse after decreasing dosage of testosterone to 200mg / ml twice weekly.  Testosterone levels were normal at that dosage and erythrocytosis returned to high normal.  Elevated BP has been long standing, he has never been on medications.  Discussed importance of starting such.  Right ear is roaring again, had full ENT evaluation with no pathology.  He did have some improvement with nasal steroids.   Review of Systems  Constitutional: Negative for activity change.  HENT: Negative for hearing loss and ear pain.        Pressure and roaring sound in right ear  Cardiovascular: Negative for chest pain.  Gastrointestinal: Negative for abdominal pain.  Genitourinary:       ED  Musculoskeletal:       Painful toe  Neurological: Negative for dizziness, light-headedness and headaches.  Hematological: Does not bruise/bleed easily.  Psychiatric/Behavioral: Negative for agitation.       Objective:   Physical Exam  Constitutional: He appears well-developed and well-nourished.  HENT:       Right TM retracted  Cardiovascular: Normal rate, regular rhythm and normal heart sounds.   Pulmonary/Chest: Effort normal and breath sounds normal.  Musculoskeletal:       Red, enlarged left firth tarsal tuberosity  Skin: Skin is warm and dry.  Psychiatric: He has a normal mood and affect.          Assessment & Plan:

## 2010-12-06 NOTE — Assessment & Plan Note (Signed)
Seemed much calmer in the room today with the door closed.

## 2010-12-06 NOTE — Assessment & Plan Note (Signed)
Resume nasal steroid.

## 2010-12-06 NOTE — Patient Instructions (Addendum)
Return in one month for labs and BP check       Bunion (Hallux Valgus)    A bony bump (protrusion) on the inside of the foot, at the base of the first toe, is called a bunion (hallux valgus). A bunion causes the first toe to angle toward the other toes.  SYMPTOMS  A bony bump on the inside of the foot, causing an outward turning of the first toe. It may also overlap the second toe.  Thickening of the skin (callus) over the bony bump.  Fluid buildup under the callus. Fluid may become red, tender, and swollen (inflamed) with constant irritation or pressure.  Foot pain and stiffness.   CAUSES  Many causes exist, including:  Inherited from your family (genetics).  Injury (trauma) forcing the first toe into a position in which it overlaps other toes.  Bunions are also associated with wearing shoes that have a narrow toe box (pointy shoes).   RISK INCREASES WITH   Family history of foot abnormalities, especially bunions.  Arthritis.  Narrow shoes, especially high heels.   PREVENTIVE MEASURES   Wear shoes with a wide toe box.  Avoid shoes with high heels.  Wear a small pad between the big toe and second toe.  Maintain proper conditioning: l Foot and ankle flexibility. l Muscle strength and endurance.   PROGNOSIS With proper treatment, bunions can typically be cured. Occasionally, surgery is required.    POSSIBLE COMPLICATIONS  l Infection of the bunion. l Arthritis of the first toe. l Risks of surgery, including infection, bleeding, injury to nerves (numb toe), recurrent bunion, overcorrection (toe points inward), arthritis of the big toe, big toe pointing upward, and bone not healing.   GENERAL TREATMENT CONSIDERATIONS Treatment first consists of stopping the activities that aggravate the pain, taking pain medicines, and icing to reduce inflammation and pain. Wear shoes with a wide toe box. Shoes can be modified by a shoe repair person to relieve pressure on  the bunion, especially if you cannot find shoes with a wide enough toe box. You may also place a pad with the center cut out in your shoe, to reduce pressure on the bunion. Sometimes, an arch support (orthotic) may reduce pressure on the bunion and alleviate the symptoms. Stretching and strengthening exercises for the muscles of the foot may be useful. You may choose to wear a brace or pad at night to hold the big toe away from the second toe. If non-surgical treatments are not successful, surgery may be needed. Surgery involves removing the overgrown tissue and correcting the position of the first toe, by realigning the bones. Bunion surgery is typically performed on an outpatient basis, meaning you can go home the same day as surgery. The surgery may involve cutting the mid portion of the bone of the first toe, or just cutting and repairing (reconstructing) the ligaments and soft tissues around the first toe.    MEDICATION   If pain medicine is needed, nonsteroidal anti-inflammatory medicines, such as aspirin and ibuprofen, or other minor pain relievers, such as acetaminophen, are often recommended.   Do not take pain medicine for 7 days before surgery.   Prescription pain relievers are usually only prescribed after surgery. Use only as directed and only as much as you need.  Ointments applied to the skin may be helpful.   HEAT AND COLD   Cold treatment (icing) relieves pain and reduces inflammation. Cold treatment should be applied for 10 to 15 minutes every 2  to 3 hours for inflammation and pain and immediately after any activity that aggravates your symptoms. Use ice packs or an ice massage.  Heat treatment may be used prior to performing the stretching and strengthening activities prescribed by your caregiver, physical therapist, or athletic trainer. Use a heat pack or a warm soak.   SEEK MEDICAL CARE IF:   Symptoms get worse or do not improve in 2 weeks, despite treatment.  After  surgery, you develop fever, increasing pain, redness, swelling, drainage of fluids, bleeding, or increasing warmth around the surgical area.  New, unexplained symptoms develop. (Drugs used in treatment may produce side effects.)   Document Released: 04/10/2005  Document Re-Released: 05/02/2009 [facility] Patient Information 2011 [place], [place].

## 2010-12-07 ENCOUNTER — Encounter: Payer: Self-pay | Admitting: Family Medicine

## 2010-12-07 NOTE — Telephone Encounter (Signed)
Refill request

## 2010-12-15 ENCOUNTER — Encounter: Payer: Self-pay | Admitting: Family Medicine

## 2010-12-15 ENCOUNTER — Other Ambulatory Visit: Payer: Self-pay | Admitting: Family Medicine

## 2010-12-15 ENCOUNTER — Ambulatory Visit (INDEPENDENT_AMBULATORY_CARE_PROVIDER_SITE_OTHER): Payer: Medicaid Other | Admitting: Family Medicine

## 2010-12-15 VITALS — BP 147/85 | HR 80 | Temp 97.9°F | Ht 78.0 in | Wt 265.0 lb

## 2010-12-15 DIAGNOSIS — F3289 Other specified depressive episodes: Secondary | ICD-10-CM

## 2010-12-15 DIAGNOSIS — H7190 Unspecified cholesteatoma, unspecified ear: Secondary | ICD-10-CM

## 2010-12-15 DIAGNOSIS — D751 Secondary polycythemia: Secondary | ICD-10-CM | POA: Insufficient documentation

## 2010-12-15 DIAGNOSIS — F329 Major depressive disorder, single episode, unspecified: Secondary | ICD-10-CM

## 2010-12-15 DIAGNOSIS — T887XXA Unspecified adverse effect of drug or medicament, initial encounter: Secondary | ICD-10-CM

## 2010-12-15 DIAGNOSIS — F32A Depression, unspecified: Secondary | ICD-10-CM

## 2010-12-15 DIAGNOSIS — H6980 Other specified disorders of Eustachian tube, unspecified ear: Secondary | ICD-10-CM

## 2010-12-15 DIAGNOSIS — F320 Major depressive disorder, single episode, mild: Secondary | ICD-10-CM

## 2010-12-15 DIAGNOSIS — G4700 Insomnia, unspecified: Secondary | ICD-10-CM

## 2010-12-15 LAB — BASIC METABOLIC PANEL
BUN: 19 mg/dL (ref 6–23)
CO2: 27 mEq/L (ref 19–32)
Calcium: 9.6 mg/dL (ref 8.4–10.5)
Chloride: 101 mEq/L (ref 96–112)
Creat: 1.03 mg/dL (ref 0.50–1.35)
Glucose, Bld: 76 mg/dL (ref 70–99)
Potassium: 4.5 mEq/L (ref 3.5–5.3)
Sodium: 140 mEq/L (ref 135–145)

## 2010-12-15 NOTE — Progress Notes (Signed)
Subjective:    Patient ID: Thomas [name], male    DOB: Mar 14, 1951, 60 y.o.   MRN: [id number]  HPI 1. Right Ear Pain Patient has chronic right ear pain associated with feeling of fullness. He has a retracted TM. He was seen by ENT who did not find any abnormalities. He has normal hearing. He has no sx of infection. He does not have a history of otitis media growing up. He has no foreign body present. He c/o ringing in the ear occasionally. His right external ear canal is very sensitive to water flush performed today at patients request. He has no fevers, weight loss, hearing loss, discharge.  2. Depression/Poor Sleep Scored 7 on the PHQ-9. 5-9 is Mild Depression. He complains of difficulty falling asleep and frequent waking throughout the night (without snoring or acid reflex/sob). Fatigue, loss of interest in daily activities. He is not on an antidepressant. He had some improvement in these symptoms when his testosterone was replaced. This has returned as his testosterone dose was decreased because of erythrocytosis side effect. No suicidality.  3. Erythrocytosis/testosterone supplementation side effect 17/50 h/h, slight decrease after his testosterone dose decreased. Per Luretha Murphy his CBC was normal prior to starting testosterone.   4. HTN 140/65 today - excellent response to ACEI. No headaches, no visual changes, no chest pain. Last BUN/CR normal.  Review of Systems  Constitutional: Positive for fatigue. Negative for fever, activity change, appetite change and unexpected weight change.  HENT: Positive for ear pain and tinnitus. Negative for hearing loss, congestion, rhinorrhea, sneezing, postnasal drip and ear discharge.   Eyes: Negative for visual disturbance.  Respiratory: Positive for choking. Negative for cough, chest tightness and shortness of breath.   Skin: Negative for rash.  Neurological: Negative for dizziness, light-headedness and headaches.  Psychiatric/Behavioral: Positive  for sleep disturbance. Negative for suicidal ideas and self-injury. The patient is not nervous/anxious.        PHQ-9: mild depression       Objective:   Physical Exam  Nursing note and vitals reviewed. Constitutional: He appears well-developed and well-nourished. No distress.  HENT:  Right Ear: There is tenderness. No drainage or swelling. No foreign bodies. No mastoid tenderness. Tympanic membrane is retracted. Tympanic membrane is not injected, not scarred, not perforated, not erythematous and not bulging. No middle ear effusion. No hemotympanum. No decreased hearing is noted.  Left Ear: Tympanic membrane, external ear and ear canal normal. No tenderness. No foreign bodies. Tympanic membrane is not injected, not scarred, not perforated, not erythematous, not retracted and not bulging.  Mouth/Throat: Oropharynx is clear and moist and mucous membranes are normal.  Skin: Skin is warm. No rash noted. He is not diaphoretic.  Psychiatric: He has a normal mood and affect. His behavior is normal. Thought content normal.      Assessment & Plan:  1. Right Ear Pain - Eustachian Tube Dysfunction with history of Middle ear effusion and chronic pain/pressure. No GERD/Allergies/Sinus symptoms that could be causes. - Will get CT Scan to evaluate for possible keratoma buildup - cholesteatoma  2. Depression/Poor Sleep Scored 7 on the PHQ-9. 5-9 is Mild Depression.  - will reevaluate next visit - consider starting sleeping aid medication - consider starting Antidepressant if no improvement in PHQ-9  3. Erythrocytosis/testosterone supplementation side effect - continue with current testosterone dose - will recheck CBC in 3 months - consider donating blood quarterly.  4. HTN 140/65 today - excellent response to ACEI.  - BMET today - Increase Lisinopril  dose

## 2010-12-15 NOTE — Patient Instructions (Signed)
Consider donating blood Increase lisinopril to a whole tab CT scan will be scheduled We will call you with the results Recommend exercising your ear by blowing into a closed nose

## 2010-12-16 ENCOUNTER — Encounter: Payer: Self-pay | Admitting: Family Medicine

## 2010-12-21 ENCOUNTER — Encounter: Payer: Self-pay | Admitting: Family Medicine

## 2011-01-04 ENCOUNTER — Telehealth: Payer: Self-pay | Admitting: Family Medicine

## 2011-01-04 NOTE — Telephone Encounter (Signed)
Pt states he will be out of town tomorrow and cannot make CT appt (it has been cancelled)- would like to speak with [place] before it is rescheduled.

## 2011-01-05 ENCOUNTER — Other Ambulatory Visit (HOSPITAL_COMMUNITY): Payer: Medicaid Other

## 2011-01-05 NOTE — Telephone Encounter (Signed)
Called, and left message to call back.

## 2011-01-13 ENCOUNTER — Ambulatory Visit (INDEPENDENT_AMBULATORY_CARE_PROVIDER_SITE_OTHER): Payer: Medicaid Other | Admitting: Family Medicine

## 2011-01-13 ENCOUNTER — Encounter: Payer: Self-pay | Admitting: Family Medicine

## 2011-01-13 VITALS — BP 145/87 | HR 89 | Wt 266.9 lb

## 2011-01-13 DIAGNOSIS — M159 Polyosteoarthritis, unspecified: Secondary | ICD-10-CM | POA: Insufficient documentation

## 2011-01-13 DIAGNOSIS — D751 Secondary polycythemia: Secondary | ICD-10-CM

## 2011-01-13 DIAGNOSIS — M545 Low back pain, unspecified: Secondary | ICD-10-CM

## 2011-01-13 DIAGNOSIS — I10 Essential (primary) hypertension: Secondary | ICD-10-CM

## 2011-01-13 DIAGNOSIS — M549 Dorsalgia, unspecified: Secondary | ICD-10-CM | POA: Insufficient documentation

## 2011-01-13 MED ORDER — TESTOSTERONE ENANTHATE 200 MG/ML IM SOLN: 200.0000 mg | INTRAMUSCULAR | Status: DC

## 2011-01-13 MED ORDER — HYDROCODONE-ACETAMINOPHEN 5-500 MG PO TABS: 1.0000 | ORAL_TABLET | Freq: Every day | ORAL | Status: DC

## 2011-01-13 NOTE — Assessment & Plan Note (Addendum)
Pain is at night as well as during the day, has to take OTC medications 4 times a day to get though day.  Past work up DJD. Prescribed Hydrocodone/APAP has used in the past, he will only use one or two daily.  He has never showed to have problems with controlled substances.

## 2011-01-13 NOTE — Progress Notes (Signed)
  Subjective:    Patient ID: Thomas [name], male    DOB: 1950-04-27, 60 y.o.   MRN: [id number]  HPI Low back pain for years.  Has been a stone mason for 44 years.  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 Pain present at night and throughout the day. It is getting worse.  He takes OTC medications 3-4 times a day.  He hates to admit it but after his Mother died she had hydrocodone and has used 1/2 in the AM and one at night for a short time, it really helped.  He was told in the past that he as degenerative disc disease.  Pain is in both buttocks and radiated down leg.  He does not have to bend over for relief.  He is tolerating the lisinopril with not side effects.     Review of Systems  Musculoskeletal: Positive for back pain.       Objective:   Physical Exam  Constitutional: He appears well-developed and well-nourished.  Musculoskeletal: Normal range of motion.       5/5 strength of LE          Assessment & Plan:

## 2011-01-13 NOTE — Patient Instructions (Signed)
If your back pain is not controlled return to see Dr. Deirdre Priest It would be good to see him int he next 3 months

## 2011-01-13 NOTE — Assessment & Plan Note (Signed)
Tolerating ACEI, BP not quite at goal but much close

## 2011-01-16 ENCOUNTER — Other Ambulatory Visit: Payer: Self-pay | Admitting: Family Medicine

## 2011-01-16 MED ORDER — "SYRINGE 22G X 1"" 3 ML MISC": 1.0000 | Status: DC

## 2011-01-30 ENCOUNTER — Telehealth: Payer: Self-pay | Admitting: Family Medicine

## 2011-01-30 MED ORDER — HYDROCODONE-ACETAMINOPHEN 5-500 MG PO TABS: ORAL_TABLET | ORAL | Status: DC

## 2011-01-30 NOTE — Telephone Encounter (Signed)
Pt has been taking the Vicodin as prescribed and also was told he could take 1/2 during the day along with Tylenol.  That was working very well and because of this, he is running low and pharmacy can't fill it until 10/25.  He has 6 left and needs to know if we could call in a refill for him.  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 [facility] [address]

## 2011-01-30 NOTE — Telephone Encounter (Signed)
Called meds in.  Left message with patient.

## 2011-01-30 NOTE — Telephone Encounter (Signed)
Will route this to Dr. Deirdre Priest and if he approves I will call in enough to get him through the 25th.

## 2011-01-30 NOTE — Telephone Encounter (Signed)
Yes pleas call him in 30 which should last him for the remainder of this prescription until 10/25 and then thru 11/25 (with his other refill)  He needs to see me before 11/25  Thanks  LC

## 2011-01-31 LAB — COMPREHENSIVE METABOLIC PANEL
ALT: 62 — ABNORMAL HIGH
AST: 39 — ABNORMAL HIGH
Albumin: 4.3
Alkaline Phosphatase: 66
BUN: 12
CO2: 30
Calcium: 9.3
Chloride: 101
Creatinine, Ser: 1.04
GFR calc Af Amer: >60
GFR calc non Af Amer: >60
Glucose, Bld: 87
Potassium: 3.8
Sodium: 140
Total Bilirubin: 1.4 — ABNORMAL HIGH
Total Protein: 7.1

## 2011-01-31 LAB — DIFFERENTIAL
Basophils Absolute: 0
Basophils Relative: 0
Eosinophils Absolute: 0.3
Eosinophils Relative: 5
Lymphocytes Relative: 24
Lymphs Abs: 1.5
Monocytes Absolute: 0.7
Monocytes Relative: 11
Neutro Abs: 3.7
Neutrophils Relative %: 60

## 2011-01-31 LAB — CBC
HCT: 49.4
Hemoglobin: 16.6
MCHC: 33.7
MCV: 90.3
Platelets: 256
RBC: 5.47
RDW: 13
WBC: 6.2

## 2011-02-20 ENCOUNTER — Encounter: Payer: Self-pay | Admitting: Family Medicine

## 2011-02-20 ENCOUNTER — Ambulatory Visit
Admission: RE | Admit: 2011-02-20 | Discharge: 2011-02-20 | Disposition: A | Discharge status: Home / Self Care | Payer: No Typology Code available for payment source | Source: Ambulatory Visit | Attending: Family Medicine | Admitting: Family Medicine

## 2011-02-20 ENCOUNTER — Ambulatory Visit (INDEPENDENT_AMBULATORY_CARE_PROVIDER_SITE_OTHER): Payer: No Typology Code available for payment source | Admitting: Family Medicine

## 2011-02-20 VITALS — BP 117/83 | HR 73 | Temp 97.6°F | Ht 79.0 in | Wt 266.0 lb

## 2011-02-20 DIAGNOSIS — M545 Low back pain, unspecified: Secondary | ICD-10-CM

## 2011-02-20 DIAGNOSIS — E291 Testicular hypofunction: Secondary | ICD-10-CM

## 2011-02-20 DIAGNOSIS — E349 Endocrine disorder, unspecified: Secondary | ICD-10-CM

## 2011-02-20 DIAGNOSIS — I10 Essential (primary) hypertension: Secondary | ICD-10-CM

## 2011-02-20 NOTE — Assessment & Plan Note (Addendum)
Most likely musculoskeletal  Without evidence of nerve impignment or mass lesion.  He has not had an xray for years and given his age we should get one.  Will try to control with NSAID and acetaminophen and hopefully avoid regular narcotic use

## 2011-02-20 NOTE — Assessment & Plan Note (Signed)
Controlled today but not sure of his dose.   Continue what he is taking

## 2011-02-20 NOTE — Patient Instructions (Signed)
Try ibuprofen 4 tablet (800 mg) three times daily with a small amount of food Can take with acetominophen 650 three times daily  I will call you if the xray is abnormal  Call me if the pain is not under control  Bring in all your medication next visit

## 2011-02-20 NOTE — Assessment & Plan Note (Signed)
Unsure of his dose - will bring in medications next visit

## 2011-02-20 NOTE — Progress Notes (Signed)
  Subjective:    Patient ID: Thomas [name], male    DOB: 10-06-1950, 60 y.o.   MRN: [id number]  HPI  BACK PAIN  Location: mid low back with pain in buttocks and back of legs Quality: achy not shooting Onset: on and off for years worsening  Worse with: first waking up or after long day of work  Better with: OTC migraine medications or ibuprofen.  Out of hydrocodone for 1 week Radiation: does not shoot down his legs Trauma: no Best sitting/standing/leaning forward: no  Red Flags Fecal/urinary incontinence: no  Numbness/Weakness: no  Fever/chills/sweats: no  Night pain: no  Unexplained weight loss: no  No relief with bedrest: no  h/o cancer/immunosuppression: no  IV drug use: no  PMH of osteoporosis or chronic steroid use: no   Review of Symptoms - see HPI  PMH - Smoking status noted.       Review of Systems     Objective:   Physical Exam  Back - good range of motion forward and backward bending.   No focal tenderness or deformity Neurologic exam :Able to walk on heels and toes.  Balance normal         Assessment & Plan:

## 2011-02-22 ENCOUNTER — Ambulatory Visit: Payer: Medicaid Other | Admitting: Family Medicine

## 2011-02-23 ENCOUNTER — Telehealth: Payer: Self-pay | Admitting: Family Medicine

## 2011-02-23 MED ORDER — HYDROCODONE-ACETAMINOPHEN 5-325 MG PO TABS: 1.0000 | ORAL_TABLET | Freq: Three times a day (TID) | ORAL | Status: DC | PRN

## 2011-02-23 NOTE — Telephone Encounter (Signed)
Please call in hydrocodone Rx to [facility]  Please call patient and let him know that I could not find a hydrocodone without any tylenol but am prescribing one with a lower dose of tylenol per pill  Thanks  LC

## 2011-02-23 NOTE — Telephone Encounter (Signed)
Pt informed and Rx left on Pharmacy's VM Thomas [name], [facility]

## 2011-02-23 NOTE — Telephone Encounter (Signed)
Patient asking to speak with MD re: his pain, was given a pain regimen of tylenol and ibuprofen, pt feels like hes taking so much & its not helping much, just says hes urinating more but still in pain.

## 2011-03-06 ENCOUNTER — Encounter: Payer: Self-pay | Admitting: Family Medicine

## 2011-03-06 ENCOUNTER — Other Ambulatory Visit: Payer: Self-pay | Admitting: Family Medicine

## 2011-03-06 ENCOUNTER — Ambulatory Visit (INDEPENDENT_AMBULATORY_CARE_PROVIDER_SITE_OTHER): Payer: Self-pay | Admitting: Family Medicine

## 2011-03-06 DIAGNOSIS — M545 Low back pain, unspecified: Secondary | ICD-10-CM

## 2011-03-06 DIAGNOSIS — I10 Essential (primary) hypertension: Secondary | ICD-10-CM

## 2011-03-06 MED ORDER — HYDROCODONE-ACETAMINOPHEN 5-325 MG PO TABS: 1.0000 | ORAL_TABLET | Freq: Two times a day (BID) | ORAL | Status: DC

## 2011-03-06 NOTE — Patient Instructions (Addendum)
Do not take more than 2,400 mg of ibuprofen a day Or more than 3,000 mg of tylenol a day  Try to go some days without taking 2 vicoden per day  If you get weakness, or trouble controlling your bowels or bladder or loss of sensation in your legs then call me  Consider the colonscopy  If your blood pressure is regularly > 140/90 either number then call us.  Try to check it 1-2 times a week.

## 2011-03-06 NOTE — Progress Notes (Signed)
  Subjective:    Patient ID: Thomas [name], male    DOB: 27-Mar-1951, 60 y.o.   MRN: [id number]  HPI  BackPain Continues.  Sometime low back and sometimes in the back of his thighs left more oftne than R. No weakness, incontinence, fever or weight loss. Uses 4 tylenol, 4 ibuprofen and 2 vicoden per day when he works.  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 PMH - recent xray not focal lesions  Testosterone deficiency Using injection weekly.  Feels it make him overall better.  No side effects that he can determine.  HYPERTENSION Disease Monitoring Home BP Monitoring can't remember Chest pain- no     Dyspnea-  no  Medications Compliance: taking as prescribed. Lightheadedness-  no  Edema-  no   ROS - See HPI  Lab Review   Potassium  Date Value Range Status  12/15/2010 4.5  3.5-5.3 (mEq/L) Final     Sodium  Date Value Range Status  12/15/2010 140  135-145 (mEq/L) Final   Review of Symptoms - see HPI  PMH - Smoking status noted.      Review of Systems     Objective:   Physical Exam  Moves slowly from sitting to standing and around the room.      Assessment & Plan:

## 2011-03-06 NOTE — Assessment & Plan Note (Signed)
This is a chronic condition most consistent with DJD of back and perhaps sciatica.  It is exacerbated by work.  His analgesics allow him to continue working.  He does not seem to have any pathology ameneable to surgery.  Will continue medications.  Discussed toleracne to narcotics and how will not increase the dose and that he should not take them when the pain is not severe.  He signed narcotics contract.  Progress toward pain control and promoting increased function:  Good currently Current Non Narcotic Therapies: see medication list Risk Assessment for Opioid Therapy     Asking for early refills; frequent telephone calls or lost prescriptions no   Narcotics from other health providers: will need to check narcotics database in future Urine Drug Screen reviewed: no [place] Drug Database reviewed: no

## 2011-03-06 NOTE — Assessment & Plan Note (Addendum)
Elevated today but has been controlled.  Will monitor his blood pressure increase his lisinopril or add hctz if continues high

## 2011-03-08 ENCOUNTER — Ambulatory Visit: Payer: No Typology Code available for payment source | Admitting: Family Medicine

## 2011-03-10 ENCOUNTER — Telehealth: Payer: Self-pay | Admitting: Family Medicine

## 2011-03-10 ENCOUNTER — Other Ambulatory Visit: Payer: Self-pay | Admitting: Family Medicine

## 2011-03-10 MED ORDER — TESTOSTERONE ENANTHATE 200 MG/ML IM SOLN: 100.0000 mg | INTRAMUSCULAR | Status: DC

## 2011-03-10 NOTE — Telephone Encounter (Signed)
Pt calling re: his rx for testosterone, says it was originally prescribed by Luretha Murphy, had him taking 1 ml per week then cut it down to 1/2 ml per week, pt says the pharmacy has it as 1 ml per month so they say he is too early for a refill, pt needs this fixed asap,  [facility]es to [facility]

## 2011-03-10 NOTE — Telephone Encounter (Signed)
Patient informed, expressed undserstanding.

## 2011-03-10 NOTE — Telephone Encounter (Signed)
Pls let him know I sent it in and to call if any problems Tahnks

## 2011-04-13 ENCOUNTER — Telehealth: Payer: Self-pay | Admitting: Family Medicine

## 2011-04-13 NOTE — Telephone Encounter (Signed)
Please call him.  I am not sure which medication he means.  If it is the norco then he was given a written Rx for 60 with 4 refills on 11/12  Thanks  LC

## 2011-04-13 NOTE — Telephone Encounter (Signed)
Patient has been taking lisinopril 1 tablet daily instead of 1/2 tablet. Needs new rx sent to his pharmacy with the correct dosage, will forward to PCP.

## 2011-04-13 NOTE — Telephone Encounter (Signed)
Need new rx sent to [facility] showing patient dosage should be 1 tab daily instead of 1/2.  Old rx still on file and pharmacy will not change until new order sent in.

## 2011-04-14 MED ORDER — LISINOPRIL 20 MG PO TABS: 20.0000 mg | ORAL_TABLET | Freq: Every day | ORAL | Status: DC

## 2011-04-14 NOTE — Telephone Encounter (Signed)
I sent it in.  thanks

## 2011-08-04 ENCOUNTER — Other Ambulatory Visit: Payer: Self-pay | Admitting: Family Medicine

## 2011-08-07 MED ORDER — HYDROCODONE-ACETAMINOPHEN 5-325 MG PO TABS: 1.0000 | ORAL_TABLET | Freq: Two times a day (BID) | ORAL | Status: DC

## 2011-08-07 NOTE — Telephone Encounter (Signed)
Please call in hydrocodone and ask him to make an appointment  Thanks  LC

## 2011-08-07 NOTE — Telephone Encounter (Signed)
Med called to pharmacy per Dr Deirdre Priest

## 2011-09-24 ENCOUNTER — Other Ambulatory Visit: Payer: Self-pay | Admitting: Family Medicine

## 2011-09-25 ENCOUNTER — Other Ambulatory Visit: Payer: Self-pay | Admitting: Family Medicine

## 2011-09-25 NOTE — Telephone Encounter (Signed)
Please call this in.  Let him know he will need an office visit before more refills  Thanks  LC

## 2011-10-23 ENCOUNTER — Telehealth: Payer: Self-pay | Admitting: Family Medicine

## 2011-10-23 ENCOUNTER — Other Ambulatory Visit: Payer: Self-pay | Admitting: Family Medicine

## 2011-10-23 NOTE — Telephone Encounter (Signed)
Looks like I wrote a Rx on 6/2 for 60 with 1 refill.  Please check with pharmacy that they got it.  If not please gve verbal Rx for 60 with no refill  Thanks  Nedra Hai

## 2011-10-23 NOTE — Telephone Encounter (Signed)
Spoke with [employer].  Patient brought in Rx on 09/25/11, but does not have any refills.  Verbal order given to refill this time only per Dr. Deirdre Priest.  Left message for patient to check with his pharmacy.  Gaylene Brooks, RN

## 2011-10-23 NOTE — Telephone Encounter (Signed)
Pt has appt for 7/24 & pharmacy states they do not have refill for his Norco.(looks like there is refill)  Needs some called in until his appt on the 24th. [facility] [place]

## 2011-10-23 NOTE — Telephone Encounter (Signed)
Will route note to Dr. Deirdre Priest.  Gaylene Brooks, RN

## 2011-11-11 ENCOUNTER — Other Ambulatory Visit: Payer: Self-pay | Admitting: Family Medicine

## 2011-11-15 ENCOUNTER — Encounter: Payer: Self-pay | Admitting: Family Medicine

## 2011-11-15 ENCOUNTER — Ambulatory Visit (INDEPENDENT_AMBULATORY_CARE_PROVIDER_SITE_OTHER): Payer: Self-pay | Admitting: Family Medicine

## 2011-11-15 VITALS — BP 130/84 | HR 73 | Temp 98.3°F | Ht 79.0 in | Wt 244.0 lb

## 2011-11-15 DIAGNOSIS — M545 Low back pain, unspecified: Secondary | ICD-10-CM

## 2011-11-15 DIAGNOSIS — E291 Testicular hypofunction: Secondary | ICD-10-CM

## 2011-11-15 DIAGNOSIS — E349 Endocrine disorder, unspecified: Secondary | ICD-10-CM

## 2011-11-15 DIAGNOSIS — I10 Essential (primary) hypertension: Secondary | ICD-10-CM

## 2011-11-15 DIAGNOSIS — Z125 Encounter for screening for malignant neoplasm of prostate: Secondary | ICD-10-CM | POA: Insufficient documentation

## 2011-11-15 LAB — BASIC METABOLIC PANEL
BUN: 25 mg/dL — ABNORMAL HIGH (ref 6–23)
CO2: 24 mEq/L (ref 19–32)
Calcium: 9.1 mg/dL (ref 8.4–10.5)
Chloride: 106 mEq/L (ref 96–112)
Creat: 0.91 mg/dL (ref 0.50–1.35)
Glucose, Bld: 80 mg/dL (ref 70–99)
Potassium: 4.1 mEq/L (ref 3.5–5.3)
Sodium: 141 mEq/L (ref 135–145)

## 2011-11-15 LAB — PSA: PSA: 2.38 ng/mL (ref <=4.00)

## 2011-11-15 MED ORDER — HYDROCODONE-ACETAMINOPHEN 5-325 MG PO TABS: 1.0000 | ORAL_TABLET | Freq: Two times a day (BID) | ORAL | Status: DC | PRN

## 2011-11-15 MED ORDER — TESTOSTERONE ENANTHATE 200 MG/ML IM SOLN: INTRAMUSCULAR | Status: DC

## 2011-11-15 NOTE — Assessment & Plan Note (Signed)
Check PSA and continue - he is using intermittently.  Will check cbc next visit

## 2011-11-15 NOTE — Assessment & Plan Note (Signed)
Better on recheck here.  Would not push medication because does not have comorbidities or strong fhx and is at risk for falls in his work (height and heat)

## 2011-11-15 NOTE — Patient Instructions (Addendum)
Try 2 acetominophen and 3 ibuprofen each morning  If you have a chance check your blood pressure - goal is < 140/90  If you continue to lose weight contact us  I will call you if your tests are not good.  Otherwise I will send you a letter.  If you do not hear from me with in 2 weeks please call our office.

## 2011-11-15 NOTE — Progress Notes (Signed)
  Subjective:    Patient ID: Thomas [name], male    DOB: 09-30-1950, 61 y.o.   MRN: [id number]  HPI  BackPain Continues.  Sometime low back and sometimes in the back of his thighs left more oftne than R. No weakness, incontinence, fever or weight loss. Uses 2 tylenol, 4 ibuprofen in AM and 4 ibuprofen in the PM and 2 vicoden per day when he works.  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 PMH - recent xray not focal lesions  Testosterone deficiency When can afford using injection weekly.  Feels it make him overall better.  No side effects that he can determine.  No urinary hesitancy or bleeding   HYPERTENSION Disease Monitoring Home BP Monitoring does not check Chest pain- no     Dyspnea-  no  Medications Compliance: taking as prescribed. Lightheadedness-  no  Edema-  no  Works on scaffolding as a Pharmacist, community - See HPI  Review of Systems     Objective:   Physical Exam  Heart - Regular rate and rhythm.  No murmurs, gallops or rubs.    Lungs:  Normal respiratory effort, chest expands symmetrically. Lungs are clear to auscultation, no crackles or wheezes. Extremities:  No cyanosis, edema, or deformity noted with good range of motion of all major joints.   Moves stiffly but ably around room and exam table       Assessment & Plan:

## 2011-11-15 NOTE — Assessment & Plan Note (Signed)
Stable.  Suggested lowering ibuprofen and increasnig tylenol.  Continues to use vicoden appropriately

## 2011-11-17 ENCOUNTER — Encounter: Payer: Self-pay | Admitting: Family Medicine

## 2011-12-07 ENCOUNTER — Other Ambulatory Visit: Payer: Self-pay | Admitting: Family Medicine

## 2012-02-02 ENCOUNTER — Other Ambulatory Visit: Payer: Self-pay | Admitting: Family Medicine

## 2012-04-29 ENCOUNTER — Ambulatory Visit (INDEPENDENT_AMBULATORY_CARE_PROVIDER_SITE_OTHER): Payer: Self-pay | Admitting: Family Medicine

## 2012-04-29 ENCOUNTER — Encounter: Payer: Self-pay | Admitting: Family Medicine

## 2012-04-29 VITALS — BP 165/110 | HR 75 | Ht 79.0 in | Wt 265.0 lb

## 2012-04-29 DIAGNOSIS — E291 Testicular hypofunction: Secondary | ICD-10-CM

## 2012-04-29 DIAGNOSIS — M545 Low back pain, unspecified: Secondary | ICD-10-CM

## 2012-04-29 DIAGNOSIS — E349 Endocrine disorder, unspecified: Secondary | ICD-10-CM

## 2012-04-29 DIAGNOSIS — Z1211 Encounter for screening for malignant neoplasm of colon: Secondary | ICD-10-CM

## 2012-04-29 DIAGNOSIS — I10 Essential (primary) hypertension: Secondary | ICD-10-CM

## 2012-04-29 DIAGNOSIS — D751 Secondary polycythemia: Secondary | ICD-10-CM

## 2012-04-29 MED ORDER — HYDROCODONE-ACETAMINOPHEN 5-325 MG PO TABS: 1.0000 | ORAL_TABLET | Freq: Two times a day (BID) | ORAL | Status: DC | PRN

## 2012-04-29 MED ORDER — TESTOSTERONE ENANTHATE 200 MG/ML IM SOLN: INTRAMUSCULAR | Status: DC

## 2012-04-29 NOTE — Assessment & Plan Note (Signed)
Check today. He is asymptomatic

## 2012-04-29 NOTE — Assessment & Plan Note (Signed)
His pain is under reasonable control with current regimen.  He asked about longer acting or stronger narcotics - we discussed draw backs of this and will continue same regimen.

## 2012-04-29 NOTE — Assessment & Plan Note (Signed)
Stable.  Will check testosterone and cbc.  PSA unchanged on recent check

## 2012-04-29 NOTE — Progress Notes (Signed)
  Subjective:    Patient ID: Thomas [name], male    DOB: 1951-03-24, 62 y.o.   MRN: [id number]  HPI  BackPain Continues unchanged.  Sometime low back and sometimes in the back of his thighs No weakness, incontinence, fever or weight loss. Uses 2 tylenol, 4 ibuprofen in AM and 4 ibuprofen in the PM and 2 vicoden during the day when he works.  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 PMH - recent xray not focal lesions  Testosterone deficiency Has been using injections weekly.  Feels it make him overall better in the past but does not seem to be working as well now.  No side effects that he can determine.  No urinary hesitancy or bleeding   HYPERTENSION Disease Monitoring Home BP Monitoring - does not check Chest pain- no     Dyspnea-  no  Medications Compliance: taking as prescribed last took lisinopril last night Lightheadedness-  no  Edema-  no   ROS - See HPI  Review of Systems     Objective:   Physical Exam Alert no acute distress Heart - Regular rate and rhythm.  No murmurs, gallops or rubs.    Lungs:  Normal respiratory effort, chest expands symmetrically. Lungs are clear to auscultation, no crackles or wheezes. Extremities:  No cyanosis, edema, or deformity noted with good range of motion of all major joints.          Assessment & Plan:   Prevention - stool cards for colon cancer screening

## 2012-04-29 NOTE — Assessment & Plan Note (Signed)
Not well controlled - but was last visit.  Relates taking his medications well.  He has claustrophobia which bothers him at doctors visits so could be contributing.  He agrees to follow with a home monitor.  Will check a bmet.  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

## 2012-04-29 NOTE — Patient Instructions (Addendum)
Check your blood pressure about 3-4 times a week.  If regularly > 140/90 then call and leave a time and number  I will call you if your tests are not good.  Otherwise I will send you a letter.  If you do not hear from me with in 2 weeks please call our office.     Send in the stool cards   Come back in 3-6 months

## 2012-04-30 ENCOUNTER — Encounter: Payer: Self-pay | Admitting: Family Medicine

## 2012-04-30 LAB — CBC
HCT: 50.5 % (ref 39.0–52.0)
Hemoglobin: 17.4 g/dL — ABNORMAL HIGH (ref 13.0–17.0)
MCH: 30.5 pg (ref 26.0–34.0)
MCHC: 34.5 g/dL (ref 30.0–36.0)
MCV: 88.4 fL (ref 78.0–100.0)
Platelets: 258 10*3/uL (ref 150–400)
RBC: 5.71 MIL/uL (ref 4.22–5.81)
RDW: 13.6 % (ref 11.5–15.5)
WBC: 7.9 10*3/uL (ref 4.0–10.5)

## 2012-04-30 LAB — BASIC METABOLIC PANEL
BUN: 18 mg/dL (ref 6–23)
CO2: 30 mEq/L (ref 19–32)
Calcium: 9.7 mg/dL (ref 8.4–10.5)
Chloride: 102 mEq/L (ref 96–112)
Creat: 0.95 mg/dL (ref 0.50–1.35)
Glucose, Bld: 88 mg/dL (ref 70–99)
Potassium: 4.2 mEq/L (ref 3.5–5.3)
Sodium: 138 mEq/L (ref 135–145)

## 2012-04-30 LAB — TESTOSTERONE: Testosterone: 856.32 ng/dL (ref 300–890)

## 2012-05-16 ENCOUNTER — Telehealth: Payer: Self-pay | Admitting: Family Medicine

## 2012-05-16 NOTE — Telephone Encounter (Signed)
Pt was given hydrocodone a few weeks ago and is needing to speak to the nurse about this -

## 2012-05-16 NOTE — Telephone Encounter (Signed)
LMOVM for pt to return call .Jaleisa Brose Dawn  

## 2012-05-16 NOTE — Telephone Encounter (Signed)
Pt returning call.Laureen Ochs, Viann Shove

## 2012-05-17 MED ORDER — CYCLOBENZAPRINE HCL 10 MG PO TABS: 10.0000 mg | ORAL_TABLET | Freq: Every evening | ORAL | Status: DC | PRN

## 2012-05-17 NOTE — Addendum Note (Signed)
Addended by: Pearlean Brownie L on: 05/17/2012 05:53 PM   Modules accepted: Orders

## 2012-05-17 NOTE — Telephone Encounter (Signed)
Spoke with pt.  He wanted to let Dr. 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  He said that normally he take 3 ibuprofen and 2 norco for his pain.  Recently that is only lasting 3 hours and he has been taking a 3rd Norco @ 3:30 which helps.  States that he "filled the norco on the 6th and have 12 left"  Wants to know if he can get a new Rx that allows him to use the 3 a day, if only to last until this job is over.  Would like it sent to [facility] on [address] and [address] as he will be home late tonight and will leave again Sunday afternoon.  Advised I would send message to MD   Of note, pt states that I can leave info on the VM as he "normally can hear the phone over all the equipment". Fleeger, Maryjo Rochester

## 2012-05-17 NOTE — Telephone Encounter (Signed)
Pt is asking what else he can take to help with this if he cannot get more pain meds.

## 2012-05-17 NOTE — Telephone Encounter (Signed)
Discussed with him why not good treatment to go up on the narcotic and that he should not take more than is prescribed.  If it is hurting him more he should come in to be seen and should not be doing the type of work that is making it hurt so much.   I will not prescribe more narcotics than we had agreed.  Also cautioned not to take more than 3 gm of tylenol or 1800 mg of ibuprofen in 24 hrs

## 2012-05-17 NOTE — Telephone Encounter (Signed)
Discussed other medications Tramadol made him feel dizzy.  Will try qhs flexaril discussed not to use while working

## 2012-05-20 LAB — POC HEMOCCULT BLD/STL (HOME/3-CARD/SCREEN)
Card #2 Fecal Occult Blod, POC: NEGATIVE
Card #3 Fecal Occult Blood, POC: NEGATIVE
Fecal Occult Blood, POC: POSITIVE

## 2012-05-20 NOTE — Addendum Note (Signed)
Addended by: [place], Carlisia Geno on: 05/20/2012 04:38 PM   Modules accepted: Orders

## 2012-05-21 ENCOUNTER — Encounter: Payer: Self-pay | Admitting: Family Medicine

## 2012-07-11 ENCOUNTER — Other Ambulatory Visit: Payer: Self-pay | Admitting: Family Medicine

## 2012-07-25 ENCOUNTER — Telehealth: Payer: Self-pay | Admitting: Family Medicine

## 2012-07-25 NOTE — Telephone Encounter (Signed)
He called back.  Acknowledged he had gotten the letter that he understands he needs a colonscopy to prevent or detect colon cancer.  He will call when he has insurance next month

## 2012-07-25 NOTE — Telephone Encounter (Signed)
Left message to call our office about a letter I sent him about test  (Positive Stool Card)  Needs GI referral

## 2012-08-03 ENCOUNTER — Other Ambulatory Visit: Payer: Self-pay | Admitting: Family Medicine

## 2012-09-01 ENCOUNTER — Other Ambulatory Visit: Payer: Self-pay | Admitting: Family Medicine

## 2012-09-02 ENCOUNTER — Other Ambulatory Visit: Payer: Self-pay | Admitting: Family Medicine

## 2012-09-02 ENCOUNTER — Other Ambulatory Visit: Payer: Self-pay | Admitting: *Deleted

## 2012-09-02 NOTE — Telephone Encounter (Signed)
Please call in

## 2012-09-02 NOTE — Telephone Encounter (Signed)
Called in Rx verbally LMOVM informing pt that "MD called in an Rx for you to [facility].  Please call with any additional questions." Fleeger, Maryjo Rochester

## 2012-09-17 ENCOUNTER — Other Ambulatory Visit: Payer: Self-pay | Admitting: Family Medicine

## 2012-10-03 ENCOUNTER — Other Ambulatory Visit: Payer: Self-pay | Admitting: Family Medicine

## 2012-10-03 NOTE — Telephone Encounter (Signed)
Please notify him he needs an office visit before can refill his hydrocodone  Thanks  LC

## 2012-10-03 NOTE — Telephone Encounter (Signed)
Denied Rx to pharmacy.  Notified pt that he needs appt.  Rainen Vanrossum, Darlyne Russian, CMA

## 2012-10-07 ENCOUNTER — Ambulatory Visit (INDEPENDENT_AMBULATORY_CARE_PROVIDER_SITE_OTHER): Payer: BC Managed Care – PPO | Admitting: Family Medicine

## 2012-10-07 ENCOUNTER — Encounter: Payer: Self-pay | Admitting: Family Medicine

## 2012-10-07 VITALS — BP 135/90 | HR 86 | Temp 98.1°F | Ht 79.0 in | Wt 266.0 lb

## 2012-10-07 DIAGNOSIS — I10 Essential (primary) hypertension: Secondary | ICD-10-CM

## 2012-10-07 DIAGNOSIS — M545 Low back pain, unspecified: Secondary | ICD-10-CM

## 2012-10-07 DIAGNOSIS — E349 Endocrine disorder, unspecified: Secondary | ICD-10-CM

## 2012-10-07 DIAGNOSIS — E291 Testicular hypofunction: Secondary | ICD-10-CM

## 2012-10-07 MED ORDER — HYDROCODONE-ACETAMINOPHEN 5-325 MG PO TABS: 1.0000 | ORAL_TABLET | Freq: Two times a day (BID) | ORAL | Status: DC | PRN

## 2012-10-07 NOTE — Patient Instructions (Addendum)
Take 1.5 tabs of lisinopril daily and measure your blood pressure at different times.  Goal is to be less than 140/90   I will call you if your tests are not good.  Otherwise I will send you a letter.  If you do not hear from me with in 2 weeks please call our office.

## 2012-10-07 NOTE — Assessment & Plan Note (Signed)
Stable on current analgesic regimen- will continue

## 2012-10-07 NOTE — Progress Notes (Signed)
  Subjective:    Patient ID: Thomas [name], male    DOB: [birth year], 62 y.o.   MRN: [id number]  HPI  Back Pain Unchanged Using slightly less ibuprofen to continue to be able to work.  No weakness or numbness or incontinence.  Uses about 2 hydrocodone per day  HYPERTENSION Disease Monitoring Home BP Monitoring better than in office but usually > 140/90 Chest pain- no    Dyspnea- no Medications Compliance-  Daily lisinopril. Lightheadedness-  no  Edema- no ROS - See HPI  PMH Lab Review   Potassium  Date Value Range Status  04/29/2012 4.2  3.5 - 5.3 mEq/L Final     Sodium  Date Value Range Status  04/29/2012 138  135 - 145 mEq/L Final     Creat  Date Value Range Status  04/29/2012 0.95  0.50 - 1.35 mg/dL Final     Creatinine, Ser  Date Value Range Status  03/13/2007 1.04   Final       Review of Symptoms - see HPI  PMH - Smoking status noted.        Review of Systems     Objective:   Physical Exam  No acute distress       Assessment & Plan:

## 2012-10-07 NOTE — Assessment & Plan Note (Signed)
Not well controlled will increase lisinopril and monitor

## 2012-10-08 LAB — PSA: PSA: 4.22 ng/mL — ABNORMAL HIGH (ref <=4.00)

## 2012-10-10 ENCOUNTER — Telehealth: Payer: Self-pay | Admitting: Family Medicine

## 2012-10-10 ENCOUNTER — Encounter: Payer: Self-pay | Admitting: Family Medicine

## 2012-10-10 DIAGNOSIS — R972 Elevated prostate specific antigen [PSA]: Secondary | ICD-10-CM

## 2012-10-10 NOTE — Telephone Encounter (Signed)
Patient called back.  Explained his PSA was high and this needed to be evaluated by urology  He agrees Will make a referral

## 2012-11-25 ENCOUNTER — Telehealth: Payer: Self-pay | Admitting: *Deleted

## 2012-11-25 NOTE — Telephone Encounter (Signed)
Received prior authorization form from [facility] for testosterone.  [employer] called for PA.  PA to be faxed. Wyatt Haste, RN-BSN

## 2012-11-25 NOTE — Telephone Encounter (Signed)
Please let him know since he is seeing [facility] now they will need to write perscription Thanks  LC

## 2012-11-25 NOTE — Telephone Encounter (Signed)
[facility] called and informed  PA needs to be sent to [facility]

## 2012-12-23 ENCOUNTER — Other Ambulatory Visit: Payer: Self-pay | Admitting: Family Medicine

## 2012-12-31 ENCOUNTER — Other Ambulatory Visit: Payer: Self-pay | Admitting: Family Medicine

## 2013-02-14 ENCOUNTER — Telehealth: Payer: Self-pay

## 2013-02-14 NOTE — Telephone Encounter (Signed)
Patient unable to get last 2 refill of HYDROcodone-acetaminophen because of the new laws. Patient is needing a new RX in order to get meds. Please call patient when ready for pickup.

## 2013-02-17 MED ORDER — HYDROCODONE-ACETAMINOPHEN 5-325 MG PO TABS: 1.0000 | ORAL_TABLET | Freq: Two times a day (BID) | ORAL | Status: DC | PRN

## 2013-02-17 MED ORDER — HYDROCODONE-ACETAMINOPHEN 5-325 MG PO TABS: ORAL_TABLET | ORAL | Status: DC

## 2013-02-17 NOTE — Telephone Encounter (Signed)
Message left for pt that rx is at front desk ready for pick up. Jazmin Hartsell,CMA

## 2013-02-17 NOTE — Telephone Encounter (Signed)
Printed 3 Rx Please let him know he must have an office visit before any more refills Thanks

## 2013-02-18 ENCOUNTER — Other Ambulatory Visit: Payer: Self-pay | Admitting: Family Medicine

## 2013-04-13 ENCOUNTER — Emergency Department (HOSPITAL_COMMUNITY)
Admission: EM | Admit: 2013-04-13 | Discharge: 2013-04-13 | Disposition: A | Discharge status: Home / Self Care | Payer: BC Managed Care – PPO | Attending: Emergency Medicine | Admitting: Emergency Medicine

## 2013-04-13 ENCOUNTER — Encounter (HOSPITAL_COMMUNITY): Payer: Self-pay | Admitting: Emergency Medicine

## 2013-04-13 DIAGNOSIS — Z79899 Other long term (current) drug therapy: Secondary | ICD-10-CM | POA: Insufficient documentation

## 2013-04-13 DIAGNOSIS — K089 Disorder of teeth and supporting structures, unspecified: Secondary | ICD-10-CM | POA: Insufficient documentation

## 2013-04-13 DIAGNOSIS — K0889 Other specified disorders of teeth and supporting structures: Secondary | ICD-10-CM

## 2013-04-13 DIAGNOSIS — Z8739 Personal history of other diseases of the musculoskeletal system and connective tissue: Secondary | ICD-10-CM | POA: Insufficient documentation

## 2013-04-13 MED ORDER — AMOXICILLIN-POT CLAVULANATE 875-125 MG PO TABS: 1.0000 | ORAL_TABLET | Freq: Two times a day (BID) | ORAL | Status: DC

## 2013-04-13 MED ORDER — TRAMADOL HCL 50 MG PO TABS: 50.0000 mg | ORAL_TABLET | Freq: Four times a day (QID) | ORAL | Status: DC | PRN

## 2013-04-13 NOTE — ED Provider Notes (Signed)
CSN: [id number]     Arrival date & time 04/13/13  1037 History   First MD Initiated Contact with Patient 04/13/13 1058     Chief Complaint  Patient presents with  . Oral Swelling   (Consider location/radiation/quality/duration/timing/severity/associated sxs/prior Treatment) HPI Comments: Patient presents with left lower dental pain that has been present for the past 3 days.  Pain is constant and gradually worsening.  He has also noticed some swelling of the area.  He denies fever, chills, difficulty swallowing, neck stiffness, or trismus.  He has been taking Tylenol for the pain with mild relief.  He currently does not have a dentist.    The history is provided by the patient.    Past Medical History  Diagnosis Date  . Left knee DJD     Xray 12/23/08   Past Surgical History  Procedure Laterality Date  . Hernia repair     History reviewed. No pertinent family history. History  Substance Use Topics  . Smoking status: Never Smoker   . Smokeless tobacco: Not on file  . Alcohol Use: No    Review of Systems  HENT: Positive for dental problem.   All other systems reviewed and are negative.    Allergies  Review of patient's allergies indicates no known allergies.  Home Medications   Current Outpatient Rx  Name  Route  Sig  Dispense  Refill  . acetaminophen (TYLENOL) 325 MG tablet   Oral   Take 650 mg by mouth every 6 (six) hours as needed.           . cyclobenzaprine (FLEXERIL) 10 MG tablet      TAKE 1 TABLET BY MOUTH AT BEDTIME AS NEEDED FOR MUSCLE SPASMS   30 tablet   1   . HYDROcodone-acetaminophen (NORCO/VICODIN) 5-325 MG per tablet   Oral   Take 1 tablet by mouth 2 (two) times daily as needed for pain.   60 tablet   0   . ibuprofen (ADVIL,MOTRIN) 800 MG tablet   Oral   Take 800 mg by mouth every 8 (eight) hours as needed for moderate pain.          Marland Kitchen lisinopril (PRINIVIL,ZESTRIL) 20 MG tablet   Oral   Take 20 mg by mouth daily.    90 tablet   2   .  Syringe/Needle, Disp, (SYRINGE 3CC/22GX1") 22G X 1" 3 ML MISC   Does not apply   1 Syringe by Does not apply route once a week. To use to administer testosterone injections   100 each   0   . testosterone enanthate (DELATESTRYL) 200 MG/ML injection      INJECT 0.5ML INTRAMUSCULARLY WEEKLY   5 mL   0   . VIAGRA 100 MG tablet      TAKE ONE TABLET BY MOUTH AS NEEDED FOR ERECTILE DYSFUNCTION   10 tablet   2    BP 159/107  Pulse 88  Temp(Src) 97.8 F (36.6 C) (Oral)  Resp 14  SpO2 97% Physical Exam  Nursing note and vitals reviewed. Constitutional: He is oriented to person, place, and time. He appears well-developed and well-nourished. No distress.  HENT:  Head: Normocephalic and atraumatic.  Mouth/Throat: Uvula is midline, oropharynx is clear and moist and mucous membranes are normal. No trismus in the jaw. Abnormal dentition. No dental abscesses or uvula swelling. No oropharyngeal exudate, posterior oropharyngeal edema, posterior oropharyngeal erythema or tonsillar abscesses.  Poor dental hygiene. Pt able to open and close mouth with out  difficulty. Airway intact. Uvula midline. Mild gingival swelling with tenderness over left lower gingiva, but no fluctuance. No swelling or tenderness of submental and submandibular regions.  No tongue elevation.    Eyes: Conjunctivae and EOM are normal.  Neck: Normal range of motion and full passive range of motion without pain. Neck supple.  Cardiovascular: Normal rate, regular rhythm and normal heart sounds.   Pulmonary/Chest: Effort normal and breath sounds normal. No stridor. No respiratory distress. He has no wheezes.  Musculoskeletal: Normal range of motion.  Lymphadenopathy:       Head (right side): No submental, no submandibular, no tonsillar, no preauricular and no posterior auricular adenopathy present.       Head (left side): No submental, no submandibular, no tonsillar, no preauricular and no posterior auricular adenopathy present.     He has no cervical adenopathy.  Neurological: He is alert and oriented to person, place, and time.  Skin: Skin is warm and dry. No rash noted. He is not diaphoretic.    ED Course  Procedures (including critical care time) Labs Review Labs Reviewed - No data to display Imaging Review No results found.  EKG Interpretation   None       MDM  No diagnosis found. Patient with toothache.  No gross abscess.  Exam unconcerning for Ludwig's angina or spread of infection.  Will treat with penicillin and pain medicine.  Urged patient to follow-up with dentist.  Return precautions discussed.     Santiago Glad, PA-C 04/14/13 [phone]

## 2013-04-13 NOTE — ED Notes (Signed)
Pt reports swelling under the tongue. States might as a result of dental infection. Reports pain at 6 with discomfort in the mouth.

## 2013-04-20 NOTE — ED Provider Notes (Signed)
Medical screening examination/treatment/procedure(s) were performed by non-physician practitioner and as supervising physician I was immediately available for consultation/collaboration.   Haely Leyland L Kenadi Miltner, MD 04/20/13 2058 

## 2013-04-28 ENCOUNTER — Other Ambulatory Visit: Payer: Self-pay | Admitting: Family Medicine

## 2013-05-12 ENCOUNTER — Other Ambulatory Visit: Payer: Self-pay | Admitting: Family Medicine

## 2013-05-21 ENCOUNTER — Encounter: Payer: Self-pay | Admitting: Family Medicine

## 2013-05-21 ENCOUNTER — Ambulatory Visit (INDEPENDENT_AMBULATORY_CARE_PROVIDER_SITE_OTHER): Payer: BC Managed Care – PPO | Admitting: Family Medicine

## 2013-05-21 VITALS — BP 140/98 | HR 86 | Temp 98.1°F | Ht 79.0 in | Wt 265.0 lb

## 2013-05-21 DIAGNOSIS — E349 Endocrine disorder, unspecified: Secondary | ICD-10-CM

## 2013-05-21 DIAGNOSIS — Z23 Encounter for immunization: Secondary | ICD-10-CM

## 2013-05-21 DIAGNOSIS — M545 Low back pain, unspecified: Secondary | ICD-10-CM

## 2013-05-21 DIAGNOSIS — E291 Testicular hypofunction: Secondary | ICD-10-CM

## 2013-05-21 DIAGNOSIS — R972 Elevated prostate specific antigen [PSA]: Secondary | ICD-10-CM

## 2013-05-21 DIAGNOSIS — R195 Other fecal abnormalities: Secondary | ICD-10-CM

## 2013-05-21 DIAGNOSIS — I10 Essential (primary) hypertension: Secondary | ICD-10-CM

## 2013-05-21 HISTORY — DX: Other fecal abnormalities: R19.5

## 2013-05-21 MED ORDER — LISINOPRIL-HYDROCHLOROTHIAZIDE 20-12.5 MG PO TABS: 1.0000 | ORAL_TABLET | Freq: Every day | ORAL | Status: DC

## 2013-05-21 MED ORDER — HYDROCODONE-ACETAMINOPHEN 5-325 MG PO TABS
1.0000 | ORAL_TABLET | Freq: Two times a day (BID) | ORAL | Status: DC | PRN
Start: 2013-05-21 — End: 2013-12-01

## 2013-05-21 MED ORDER — HYDROCODONE-ACETAMINOPHEN 5-325 MG PO TABS: 1.0000 | ORAL_TABLET | Freq: Two times a day (BID) | ORAL | Status: DC | PRN

## 2013-05-21 NOTE — Patient Instructions (Signed)
Come in for a blood test in one week.    I will call you if your lab tests are not normal.  Otherwise we will discuss them at your next visit.  Check your blood pressure regularly if not usually below 140/90 then call us  You need to see the GI doctor for a colonoscopy to rule out Colon Cancer.  Call us if any problems getting an appointment  Come back in 3-4 months

## 2013-05-21 NOTE — Progress Notes (Signed)
   Subjective:    Patient ID: Thomas [name], male    DOB: June 16, 1950, 63 y.o.   MRN: [id number]  HPI  Back Pain Continues unchanged.  Worse after he works.  He continues to work as [employer] but is doing somewhat less lifting.  No weakness or loss of sensation. Takes regular regimen of ibuprofen, tylenol, flexaril and hydrocodone which enables him to be active  HYPERTENSION Disease Monitoring Home BP Monitoring not checking Chest pain- no    Dyspnea- no Medications Compliance-  Daily 1.5 lisinopril. Lightheadedness-  no  Edema- no ROS - See HPI  Heme Postive Stool Has not seen a gastroenterologist.  No blood loss that he is aware.     PMH Lab Review   Potassium  Date Value Range Status  04/29/2012 4.2  3.5 - 5.3 mEq/L Final     Sodium  Date Value Range Status  04/29/2012 138  135 - 145 mEq/L Final     Creat  Date Value Range Status  04/29/2012 0.95  0.50 - 1.35 mg/dL Final     Creatinine, Ser  Date Value Range Status  03/13/2007 1.04   Final            Review of Systems     Objective:   Physical Exam Alert no acute distress Heart - Regular rate and rhythm.  No murmurs, gallops or rubs.    Lungs:  Normal respiratory effort, chest expands symmetrically. Lungs are clear to auscultation, no crackles or wheezes. Extremities:  No cyanosis, edema, or deformity noted with good range of motion of all major joints.          Assessment & Plan:

## 2013-05-22 NOTE — Assessment & Plan Note (Signed)
Again reiterated that we could be missing a colon cancer if he does not get a colonoscopy.  He agrees to follow up with GI.

## 2013-05-22 NOTE — Assessment & Plan Note (Signed)
Not at goal.  Change to combination ace and diuretic.  Follow for side effects and getting to goal blood pressure

## 2013-05-22 NOTE — Assessment & Plan Note (Signed)
Chronic stable on current medications.  Is taking his narcotic appropriately and is staying active

## 2013-05-24 ENCOUNTER — Other Ambulatory Visit: Payer: Self-pay | Admitting: Family Medicine

## 2013-05-26 ENCOUNTER — Telehealth: Payer: Self-pay | Admitting: Family Medicine

## 2013-05-26 NOTE — Telephone Encounter (Signed)
Pt took the new medication for High Blood pressure. He took it in the morning just as Chambliss told him to do It made him lightheaded and dizzy Please advise

## 2013-05-26 NOTE — Telephone Encounter (Signed)
Had episode of lightheadness and diarrhea when he took lasted about 4 hours.  No shortness of breath or lip swelling. Told could stop and start amlodipine or try again with current medication.  He would like to try again and will let me know

## 2013-06-04 ENCOUNTER — Other Ambulatory Visit: Payer: BC Managed Care – PPO

## 2013-06-04 DIAGNOSIS — I10 Essential (primary) hypertension: Secondary | ICD-10-CM

## 2013-06-04 LAB — COMPREHENSIVE METABOLIC PANEL
ALT: 35 U/L (ref 0–53)
AST: 28 U/L (ref 0–37)
Albumin: 4 g/dL (ref 3.5–5.2)
Alkaline Phosphatase: 77 U/L (ref 39–117)
BUN: 20 mg/dL (ref 6–23)
CO2: 30 mEq/L (ref 19–32)
Calcium: 9.3 mg/dL (ref 8.4–10.5)
Chloride: 101 mEq/L (ref 96–112)
Creat: 1.18 mg/dL (ref 0.50–1.35)
Glucose, Bld: 80 mg/dL (ref 70–99)
Potassium: 4 mEq/L (ref 3.5–5.3)
Sodium: 138 mEq/L (ref 135–145)
Total Bilirubin: 0.6 mg/dL (ref 0.2–1.2)
Total Protein: 6.8 g/dL (ref 6.0–8.3)

## 2013-06-04 NOTE — Progress Notes (Signed)
CMP DONE TODAY Thomas [name] 

## 2013-06-09 ENCOUNTER — Encounter: Payer: Self-pay | Admitting: Family Medicine

## 2013-07-07 ENCOUNTER — Ambulatory Visit (INDEPENDENT_AMBULATORY_CARE_PROVIDER_SITE_OTHER): Payer: BC Managed Care – PPO | Admitting: Family Medicine

## 2013-07-07 ENCOUNTER — Encounter: Payer: Self-pay | Admitting: Family Medicine

## 2013-07-07 VITALS — BP 161/95 | HR 80 | Temp 98.6°F | Ht 79.0 in | Wt 263.0 lb

## 2013-07-07 DIAGNOSIS — R109 Unspecified abdominal pain: Secondary | ICD-10-CM

## 2013-07-07 DIAGNOSIS — I10 Essential (primary) hypertension: Secondary | ICD-10-CM

## 2013-07-07 MED ORDER — SILDENAFIL CITRATE 100 MG PO TABS: 100.0000 mg | ORAL_TABLET | ORAL | Status: DC | PRN

## 2013-07-07 MED ORDER — LISINOPRIL 40 MG PO TABS: 40.0000 mg | ORAL_TABLET | Freq: Every day | ORAL | Status: DC

## 2013-07-07 NOTE — Patient Instructions (Addendum)
Good to see you today!  Thanks for coming in.  Take the lisinopril 40 mg daily tab and stop the combination pill.  Monitor your blood pressure when you have a chance.  Goal is < 140/90  Try calling [facility] again.  [phone]  Let us know if you are having problems getting an appointment  For the back get Aspercreme - use 3-4 times a day If the back pain is getting worse or if you see blood in your urine then come back Call on Friday if not slowly getting better  Bring in the urine as soon as you can

## 2013-07-07 NOTE — Progress Notes (Signed)
   Subjective:    Patient ID: Thomas [name], male    DOB: 09-03-1950, 63 y.o.   MRN: [id number]  HPI  BACK PAIN  Location: Left flank area Quality: stabbing aching Onset: 4 day ago gradually Worse with: movement Better with: rest Radiation: no Trauma: no Best sitting/standing/leaning forward: no   No dysruia or hematuria  Red Flags Fecal/urinary incontinence: no  Numbness/Weakness: no  Fever/chills/sweats: no  Night pain: no  Unexplained weight loss: no  No relief with bedrest: no  h/o cancer/immunosuppression: no  IV drug use: no  PMH of osteoporosis or chronic steroid use: no   HYPERTENSION Hctz/lisinopril combo causing lots of urination at PM.  No chest pain or lightheadness   Review of Symptoms - see HPI  PMH - Smoking status noted.     Review of Systems     Objective:   Physical Exam  Alert mild-moderate distress Tender focally left flank below lowest rib.  No vertebral body tenderness.  No skin changes.  Able to walk on heels and toes No SLR pain       Assessment & Plan:

## 2013-07-08 ENCOUNTER — Telehealth: Payer: Self-pay | Admitting: *Deleted

## 2013-07-08 LAB — POCT URINALYSIS DIPSTICK
Bilirubin, UA: NEGATIVE
Blood, UA: NEGATIVE
Glucose, UA: NEGATIVE
Leukocytes, UA: NEGATIVE
Nitrite, UA: NEGATIVE
Protein, UA: NEGATIVE
Spec Grav, UA: 1.025
Urobilinogen, UA: 0.2
pH, UA: 5.5

## 2013-07-08 NOTE — Telephone Encounter (Signed)
Spoke with wife Shirlean Mylar and patient is at work.  She will let him know that he is results were normal. Magdelene Ruark,CMA

## 2013-07-08 NOTE — Telephone Encounter (Signed)
Message copied by Valerie Roys on Tue Jul 08, 2013  2:38 PM ------      Message from: [name]      Created: Tue Jul 08, 2013  2:17 PM       Please call and let him know his ua is normal and no signs of renal stone      Thanks      LC ------

## 2013-07-08 NOTE — Assessment & Plan Note (Signed)
Most consistent with musculoskeletal  Pain.  No signs of zoster or fracture or infection.  Will check ua and treat with current analgesics

## 2013-07-15 ENCOUNTER — Telehealth: Payer: Self-pay | Admitting: *Deleted

## 2013-07-15 ENCOUNTER — Other Ambulatory Visit: Payer: Self-pay | Admitting: *Deleted

## 2013-07-15 MED ORDER — HYDROCODONE-ACETAMINOPHEN 5-325 MG PO TABS
1.0000 | ORAL_TABLET | Freq: Two times a day (BID) | ORAL | Status: DC | PRN
Start: 2013-07-15 — End: 2013-09-17

## 2013-07-15 NOTE — Telephone Encounter (Signed)
Pt came into clinic today for a small refill of his vicodin.  He states that he informed Dr. Erin Hearing at his last visit that he was using it 4 times a day since his pain was so bad instead of 2 times a day.  He has since run out and is unable to refill until Sunday 07-20-13.  Informed him that Dr. Erin Hearing is already out of the office and would have to ask Dr. McDiarmid.  Spoke with Dr. Wendy Poet and he was fine with giving patient a few tablets to get him through til Sunday.  Vicodin was written for BID #10 and patient is aware that he needs to stretch them.  RX placed up front for patient to pick up. Jazmin Hartsell,CMA

## 2013-07-15 NOTE — Telephone Encounter (Signed)
Pt called. [facility] tells him they will not fill the prescription as it is written. [facility] will call tomorrow and get it straighened out

## 2013-07-15 NOTE — Telephone Encounter (Signed)
Call from pharmacist regarding rx written for Vicodin 5-325 mg #10 tab.  It does not come in that dosage can it be changed to 5-300 mg #10 tab. Please call [phone] to clarify dosage.  Derl Barrow, RN

## 2013-07-16 NOTE — Telephone Encounter (Signed)
Will forward to Dr. McDiarmid to give ok. Jazmin Hartsell,CMA

## 2013-07-16 NOTE — Telephone Encounter (Signed)
See telephone note from [place], [place]. Jazmin Hartsell,CMA

## 2013-07-24 ENCOUNTER — Encounter: Payer: Self-pay | Admitting: Family Medicine

## 2013-07-24 ENCOUNTER — Ambulatory Visit (INDEPENDENT_AMBULATORY_CARE_PROVIDER_SITE_OTHER): Payer: BC Managed Care – PPO | Admitting: Family Medicine

## 2013-07-24 VITALS — BP 159/98 | HR 83 | Temp 97.7°F | Ht 79.0 in | Wt 264.0 lb

## 2013-07-24 DIAGNOSIS — M545 Low back pain, unspecified: Secondary | ICD-10-CM

## 2013-07-24 MED ORDER — OXYCODONE HCL 5 MG PO TABS: 5.0000 mg | ORAL_TABLET | ORAL | Status: DC | PRN

## 2013-07-24 NOTE — Progress Notes (Signed)
Patient ID: Thomas [name], male   DOB: [birth year], 63 y.o.   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 Taking 2 flexeril, one Norco and one Advil at bedtime helps him be able to fall asleep.  Norco not currently helping much with the pain Pt will be able to rest over the Easter weekend.   Red Flags Fecal/urinary incontinence: no  Numbness/Weakness: no  Fever/chills/sweats: no  Night pain: no  Unexplained weight loss: no  No relief with bedrest: no  h/o cancer/immunosuppression: no  IV drug use: no  PMH of osteoporosis or chronic steroid use: no Trauma:    No smoking   VS reviewed Back Exam: Taunt and tender spinal lumbar muscles compared to right. Inspection: loss of lordosis Pain with lying back on exam table Motion: Full lumbar flexion and extension.  Minimal discomfort lateral bend to right but not to left SLR seated:  Neg bilaterally                        SLR lying: neg bilaterally XSLR seated:  Neg bilaterally                       XSLR lying: Neg bilaterally  FABER: neg bilaterally Sensory change: none Reflex change: normal DTR ankle and knees bilaterally  Strength at feet Plantar-flexion: 5 / 5    Dorsi-flexion: 5 / 5    Eversion: 5 / 5   Inversion: 5 / 5 Leg strength Quad: 5 / 5   Hamstring: 5 / 5   Hip flexor:  5/ 5   Hip abductors: 5 / 5 Gait: nonatalgic

## 2013-07-24 NOTE — Assessment & Plan Note (Signed)
Relapse in acute back pain following excessive physical stress at work. Working diagnosis of non-specific back pain Doubt serious spinal pathology  Recommendation Relative rest Ice/heat Will increase opiate potency temporarily to oxycodone 5 mg every 8 hours over the weekend. Pt may continue to use his Ibuprofen and APAP. Instucted not to take Hydrocodone-APAP while taking the oxycodone.   Introduced idea of Physical Therapy referral for soft tissue release interventions and work posture review should patient's pain not improve adequately or relapses again.

## 2013-07-24 NOTE — Patient Instructions (Signed)
Back Pain, Adult °Low back pain is very common. About 1 in 5 people have back pain. The cause of low back pain is rarely dangerous. The pain often gets better over time. About half of people with a sudden onset of back pain feel better in just 2 weeks. About 8 in 10 people feel better by 6 weeks.  °CAUSES °Some common causes of back pain include: °· Strain of the muscles or ligaments supporting the spine. °· Wear and tear (degeneration) of the spinal discs. °· Arthritis. °· Direct injury to the back. °DIAGNOSIS °Most of the time, the direct cause of low back pain is not known. However, back pain can be treated effectively even when the exact cause of the pain is unknown. Answering your caregiver's questions about your overall health and symptoms is one of the most accurate ways to make sure the cause of your pain is not dangerous. If your caregiver needs more information, he or she may order lab work or imaging tests (X-rays or MRIs). However, even if imaging tests show changes in your back, this usually does not require surgery. °HOME CARE INSTRUCTIONS °For many people, back pain returns. Since low back pain is rarely dangerous, it is often a condition that people can learn to manage on their own.  °· Remain active. It is stressful on the back to sit or stand in one place. Do not sit, drive, or stand in one place for more than 30 minutes at a time. Take short walks on level surfaces as soon as pain allows. Try to increase the length of time you walk each day. °· Do not stay in bed. Resting more than 1 or 2 days can delay your recovery. °· Do not avoid exercise or work. Your body is made to move. It is not dangerous to be active, even though your back may hurt. Your back will likely heal faster if you return to being active before your pain is gone. °· Pay attention to your body when you  bend and lift. Many people have less discomfort when lifting if they bend their knees, keep the load close to their bodies, and  avoid twisting. Often, the most comfortable positions are those that put less stress on your recovering back. °· Find a comfortable position to sleep. Use a firm mattress and lie on your side with your knees slightly bent. If you lie on your back, put a pillow under your knees. °· Only take over-the-counter or prescription medicines as directed by your caregiver. Over-the-counter medicines to reduce pain and inflammation are often the most helpful. Your caregiver may prescribe muscle relaxant drugs. These medicines help dull your pain so you can more quickly return to your normal activities and healthy exercise. °· Put ice on the injured area. °· Put ice in a plastic bag. °· Place a towel between your skin and the bag. °· Leave the ice on for 15-20 minutes, 03-04 times a day for the first 2 to 3 days. After that, ice and heat may be alternated to reduce pain and spasms. °· Ask your caregiver about trying back exercises and gentle massage. This may be of some benefit. °· Avoid feeling anxious or stressed. Stress increases muscle tension and can worsen back pain. It is important to recognize when you are anxious or stressed and learn ways to manage it. Exercise is a great option. °SEEK MEDICAL CARE IF: °· You have pain that is not relieved with rest or medicine. °· You have pain that does not improve in 1 week. °· You have new symptoms. °· You are generally not feeling well. °SEEK   IMMEDIATE MEDICAL CARE IF:   You have pain that radiates from your back into your legs.  You develop new bowel or bladder control problems.  You have unusual weakness or numbness in your arms or legs.  You develop nausea or vomiting.  You develop abdominal pain.  You feel faint. Back Injury Prevention Back injuries can be extremely painful and difficult to heal. After having one back injury, you are much more likely to experience another later on. It is important to learn how to avoid injuring or re-injuring your back. The  following tips can help you to prevent a back injury. PHYSICAL FITNESS  Exercise regularly and try to develop good tone in your abdominal muscles. Your abdominal muscles provide a lot of the support needed by your back.  Do aerobic exercises (walking, jogging, biking, swimming) regularly.  Do exercises that increase balance and strength (tai chi, yoga) regularly. This can decrease your risk of falling and injuring your back.  Stretch before and after exercising.  Maintain a healthy weight. The more you weigh, the more stress is placed on your back. For every pound of weight, 10 times that amount of pressure is placed on the back. DIET  Talk to your caregiver about how much calcium and vitamin D you need per day. These nutrients help to prevent weakening of the bones (osteoporosis). Osteoporosis can cause broken (fractured) bones that lead to back pain.  Include good sources of calcium in your diet, such as dairy products, green, leafy vegetables, and products with calcium added (fortified).  Include good sources of vitamin D in your diet, such as milk and foods that are fortified with vitamin D.  Consider taking a nutritional supplement or a multivitamin if needed.  Stop smoking if you smoke. POSTURE  Sit and stand up straight. Avoid leaning forward when you sit or hunching over when you stand.  Choose chairs with good low back (lumbar) support.  If you work at a desk, sit close to your work so you do not need to lean over. Keep your chin tucked in. Keep your neck drawn back and elbows bent at a right angle. Your arms should look like the letter "L."  Sit high and close to the steering wheel when you drive. Add a lumbar support to your car seat if needed.  Avoid sitting or standing in one position for too long. Take breaks to get up, stretch, and walk around at least once every hour. Take breaks if you are driving for long periods of time.  Sleep on your side with your knees  slightly bent, or sleep on your back with a pillow under your knees. Do not sleep on your stomach. LIFTING, TWISTING, AND REACHING  Avoid heavy lifting, especially repetitive lifting. If you must do heavy lifting:  Stretch before lifting.  Work slowly.  Rest between lifts.  Use carts and dollies to move objects when possible.  Make several small trips instead of carrying 1 heavy load.  Ask for help when you need it.  Ask for help when moving big, awkward objects.  Follow these steps when lifting:  Stand with your feet shoulder-width apart.  Get as close to the object as you can. Do not try to pick up heavy objects that are far from your body.  Use handles or lifting straps if they are available.  Bend at your knees. Squat down, but keep your heels off the floor.  Keep your shoulders pulled back, your chin tucked in, and  your back straight.  Lift the object slowly, tightening the muscles in your legs, abdomen, and buttocks. Keep the object as close to the center of your body as possible.  When you put a load down, use these same guidelines in reverse.  Do not:  Lift the object above your waist.  Twist at the waist while lifting or carrying a load. Move your feet if you need to turn, not your waist.  Bend over without bending at your knees.  Avoid reaching over your head, across a table, or for an object on a high surface. OTHER TIPS  Avoid wet floors and keep sidewalks clear of ice to prevent falls.  Do not sleep on a mattress that is too soft or too hard.  Keep items that are used frequently within easy reach.  Put heavier objects on shelves at waist level and lighter objects on lower or higher shelves.  Find ways to decrease your stress, such as exercise, massage, or relaxation techniques. Stress can build up in your muscles. Tense muscles are more vulnerable to injury.  Seek treatment for depression or anxiety if needed. These conditions can increase your  risk of developing back pain. SEEK MEDICAL CARE IF:  You injure your back.  You have questions about diet, exercise, or other ways to prevent back injuries. MAKE SURE YOU:  Understand these instructions.  Will watch your condition.  Will get help right away if you are not doing well or get worse. Document Released: 05/18/2004 Document Revised: 07/03/2011 Document Reviewed: 05/22/2011 [facility] Patient Information 2014 [place], [place].

## 2013-07-28 ENCOUNTER — Encounter: Payer: Self-pay | Admitting: Family Medicine

## 2013-09-16 ENCOUNTER — Telehealth: Payer: Self-pay | Admitting: Family Medicine

## 2013-09-16 NOTE — Telephone Encounter (Signed)
Pt called and needs a refill on his hydrocodone left up front. jw

## 2013-09-17 MED ORDER — HYDROCODONE-ACETAMINOPHEN 5-325 MG PO TABS: 1.0000 | ORAL_TABLET | Freq: Two times a day (BID) | ORAL | Status: DC | PRN

## 2013-09-17 NOTE — Telephone Encounter (Signed)
Printed and left on [employer]

## 2013-09-17 NOTE — Telephone Encounter (Signed)
Pt picked up rx. Jazmin Hartsell,CMA

## 2013-10-06 ENCOUNTER — Other Ambulatory Visit: Payer: Self-pay | Admitting: Family Medicine

## 2013-11-14 ENCOUNTER — Encounter: Payer: Self-pay | Admitting: Family Medicine

## 2013-11-14 NOTE — Progress Notes (Signed)
Patient stopped by and really needs Dr. Erin Hearing to call him.  His number is [phone]. (Didn't give the reason)   Spoke with him - wanted to know if his wife could be seen here  Oswald Hillock.  Previous patient here but most recently seen at [facility]. Called back he reported she was seen yesterday in ER but left.  I strongly urged she should be seen asap at er or [place].  Given that she was seen here less than 3 years ago she can come back as our patient and as a family member see me

## 2013-11-24 ENCOUNTER — Ambulatory Visit (INDEPENDENT_AMBULATORY_CARE_PROVIDER_SITE_OTHER): Payer: BC Managed Care – PPO | Admitting: Family Medicine

## 2013-11-24 ENCOUNTER — Encounter: Payer: Self-pay | Admitting: Family Medicine

## 2013-11-24 VITALS — BP 142/86 | HR 88 | Temp 98.6°F | Ht 79.0 in | Wt 262.0 lb

## 2013-11-24 DIAGNOSIS — M25529 Pain in unspecified elbow: Secondary | ICD-10-CM | POA: Diagnosis not present

## 2013-11-24 DIAGNOSIS — M25521 Pain in right elbow: Secondary | ICD-10-CM

## 2013-11-24 MED ORDER — PREDNISONE 50 MG PO TABS: ORAL_TABLET | ORAL | Status: DC

## 2013-11-24 NOTE — Patient Instructions (Signed)
It was great seeing you today.   1. Take prednisone 50 mg (1 pill) daily for 5 days. Call or return to clinic if not improved.  2. You can ice your elbow 15 - 20 minutes 1-2 times a day   Please bring all your medications to every doctors visit  Sign up for My Chart to have easy access to your labs results, and communication with your Primary care physician.  Next Appointment  Please call to make an appointment with Dr Berkley Harvey in 5 days   I look forward to talking with you again at our next visit. If you have any questions or concerns before then, please call the clinic at [phone].  Take Care,   Dr Phill Myron

## 2013-11-25 DIAGNOSIS — M25521 Pain in right elbow: Secondary | ICD-10-CM | POA: Insufficient documentation

## 2013-11-25 NOTE — Assessment & Plan Note (Signed)
Pertinent S&O  Point tenderness at right olecranon bursa  Right hand dominate, stone mason by trade  Minimal improvement with NSAIDs x 1 month Assessment  Likely Olecranon Bursitis  Plan  Prednisone 50 mg x 5 days  F/u in 1 week if not improving and will consider imaging: [place] vs Xray

## 2013-11-25 NOTE — Progress Notes (Signed)
   Subjective:    Patient ID: Thomas [name], male    DOB: 08-10-1950, 63 y.o.   MRN: [id number]  HPI Comments: Thomas [name] comes in today for evaluation of right elbow pain that has been occurring for the several weeks. The pain is located on the posterior aspect of his olecranon and described as sharp/stabbing pain that is intermittent. Made worse when he bumps into something. Minimal relief with NSAIDs. He is a stone mason by trade; Right hand dominate. Denies any previous elbow trauma or surgeries. Denies any erythema or fevers. Denies decreased ROM or strength.   Review of Systems See HPI     Objective:   Physical Exam  Vitals reviewed. Constitutional: He appears well-developed and well-nourished.  Cardiovascular: Normal rate and regular rhythm.   Musculoskeletal:  Elbow: Unremarkable to inspection. No erythema or swelling Range of motion full pronation, supination, flexion, extension. Strength is full to all of the above directions Stable to varus, valgus stress.  Tenderness to palpation at medial proximal olecranon Negative cubital tunnel Tinel's.     Assessment/Plan:      See Problem Focused Assessment & Plan

## 2013-12-01 ENCOUNTER — Encounter: Payer: Self-pay | Admitting: Family Medicine

## 2013-12-01 ENCOUNTER — Ambulatory Visit (INDEPENDENT_AMBULATORY_CARE_PROVIDER_SITE_OTHER): Payer: BC Managed Care – PPO | Admitting: Family Medicine

## 2013-12-01 VITALS — BP 140/80 | HR 80 | Temp 98.4°F | Ht 79.0 in | Wt 257.0 lb

## 2013-12-01 DIAGNOSIS — M25529 Pain in unspecified elbow: Secondary | ICD-10-CM | POA: Diagnosis not present

## 2013-12-01 DIAGNOSIS — M25521 Pain in right elbow: Secondary | ICD-10-CM

## 2013-12-01 MED ORDER — HYDROCODONE-ACETAMINOPHEN 5-325 MG PO TABS: 1.0000 | ORAL_TABLET | Freq: Two times a day (BID) | ORAL | Status: DC | PRN

## 2013-12-01 NOTE — Progress Notes (Signed)
   Subjective:    Patient ID: Thomas [name], male    DOB: 08-03-1950, 63 y.o.   MRN: [id number]  HPI Comments: Thomas [name] comes in today for followup of her medial right elbow pain.  He reports that the pain was completely resolved with prednisone 50 mg which he took for 4 days.  He continued to be symptom free for 2 days after stopping the steroids however his pain has since returned.  He continues to endorse intermittent, sharp, medial right elbow pain.  Continues to deny any grip weakness, or numbness, tingling in forearm or hands.  Continues to deny any previous neck trauma or surgeries or neuropathic neck pain. Continues to denies fevers, chills or erythema.   Review of Systems See HPI     Objective:   Physical Exam  Musculoskeletal:  Right elbow - No swelling or erythema; Full flexion; Extension limited ~ 170 Degrees.  Tenderness to palpation over medial aspect but pain not reproduced with pronation or wrist flexion.  Strength 5/5 pronation; supination; wrist extension, flexion   Assessment/Plan:      See Problem Focused Assessment & Plan

## 2013-12-01 NOTE — Assessment & Plan Note (Signed)
Pertinent S&O  Medial elbow pain (sharp) intermittent  Elbow extension limited, pain somewhat reproduced with extension  Assessment  Concern for possible Posterior Impingement Syndrome; Osteochondritis dissecans vs chronic medial epicondylopathy  Plan  Xrays of elbow  Refilled Vicodin Rx which he uses for knee pain, but has been helping with elbow pain  Given refill for 12/19/13 and 01/19/14  Consider Sport med referral if Xray negative

## 2013-12-02 ENCOUNTER — Ambulatory Visit (HOSPITAL_COMMUNITY)
Admission: RE | Admit: 2013-12-02 | Discharge: 2013-12-02 | Disposition: A | Discharge status: Home / Self Care | Payer: BC Managed Care – PPO | Source: Ambulatory Visit | Attending: Family Medicine | Admitting: Family Medicine

## 2013-12-02 DIAGNOSIS — M25429 Effusion, unspecified elbow: Secondary | ICD-10-CM | POA: Insufficient documentation

## 2013-12-02 DIAGNOSIS — M25521 Pain in right elbow: Secondary | ICD-10-CM

## 2013-12-02 IMAGING — CR DG ELBOW COMPLETE 3+V*R*
4 series · 4 of 4 positions shown · non-contrast
Comparison: None.

CLINICAL DATA: Medial elbow pain

EXAM:
RIGHT ELBOW - COMPLETE 3+ VIEW

[x elbow joint ap right *]
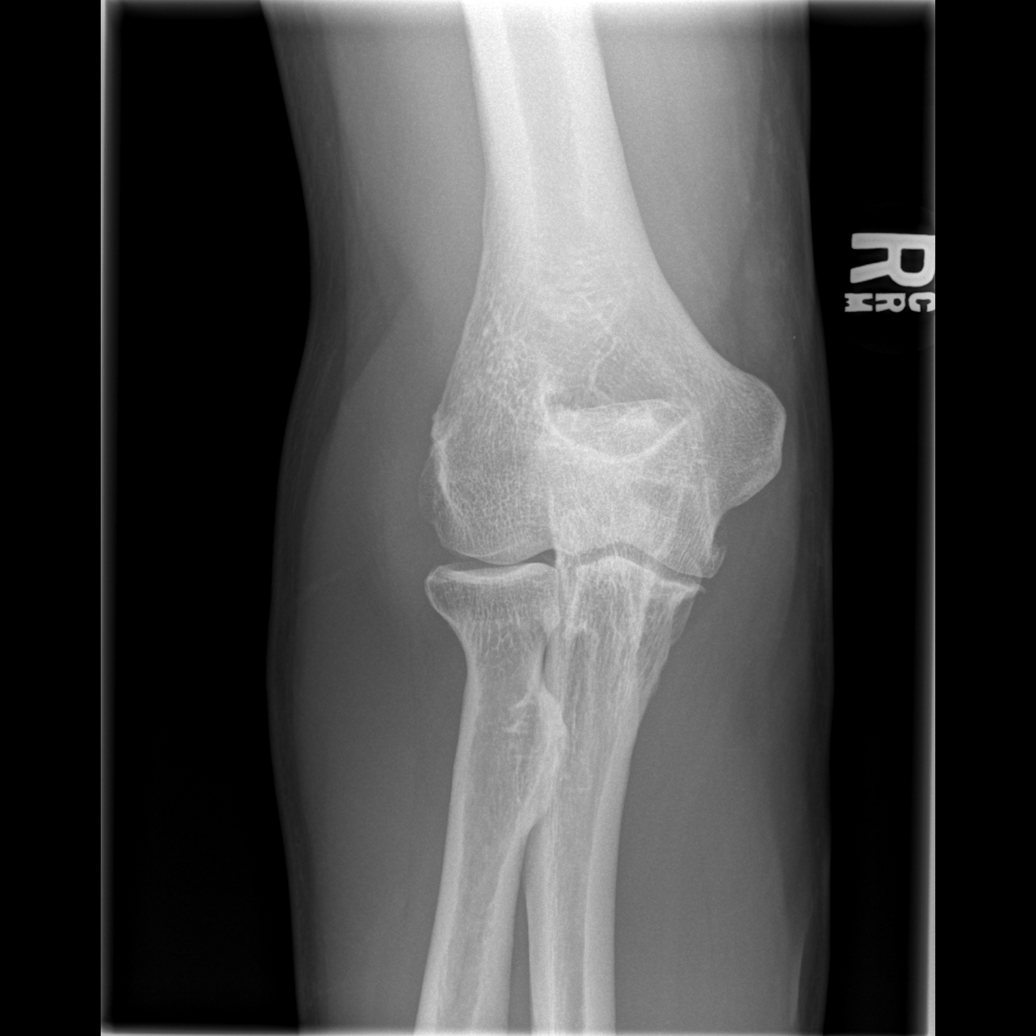

[x elbow joint obl. right * (1 of 2)]
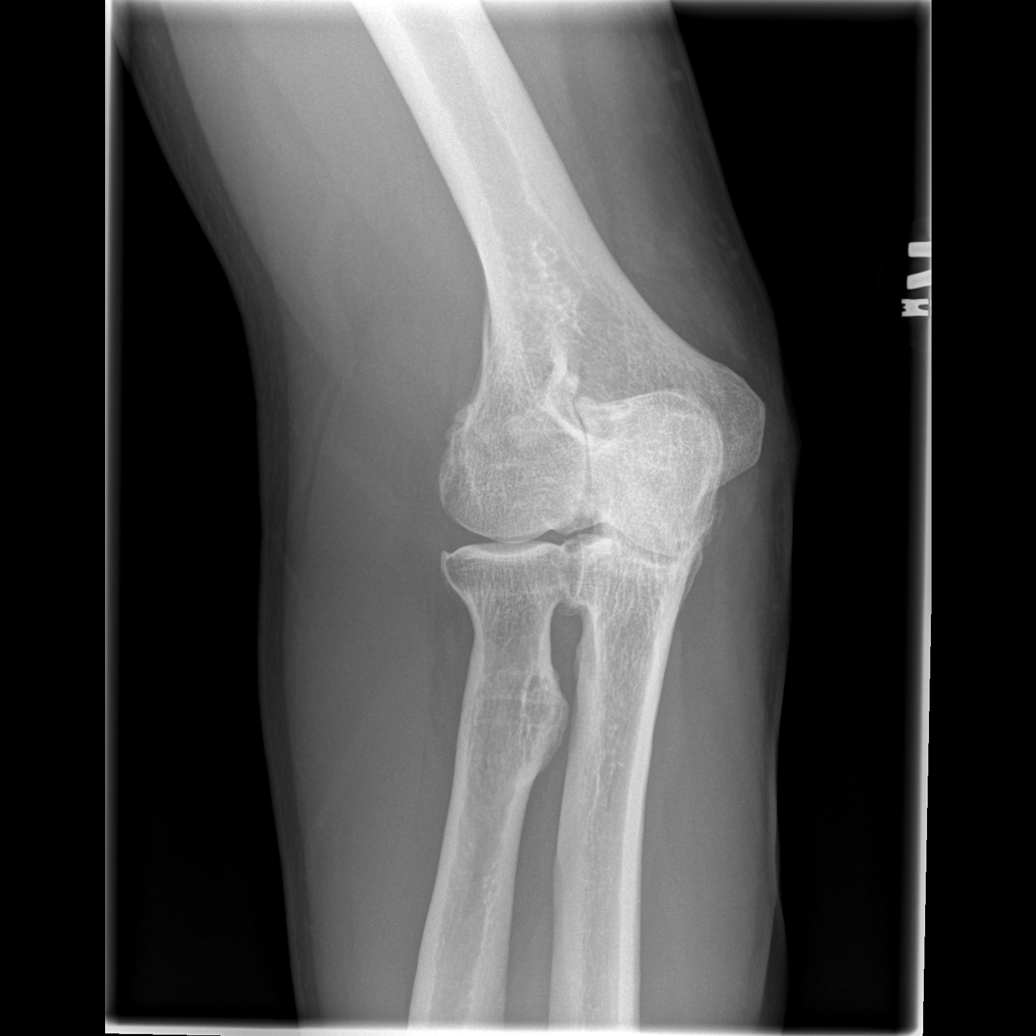

[x elbow joint obl. right * (2 of 2)]
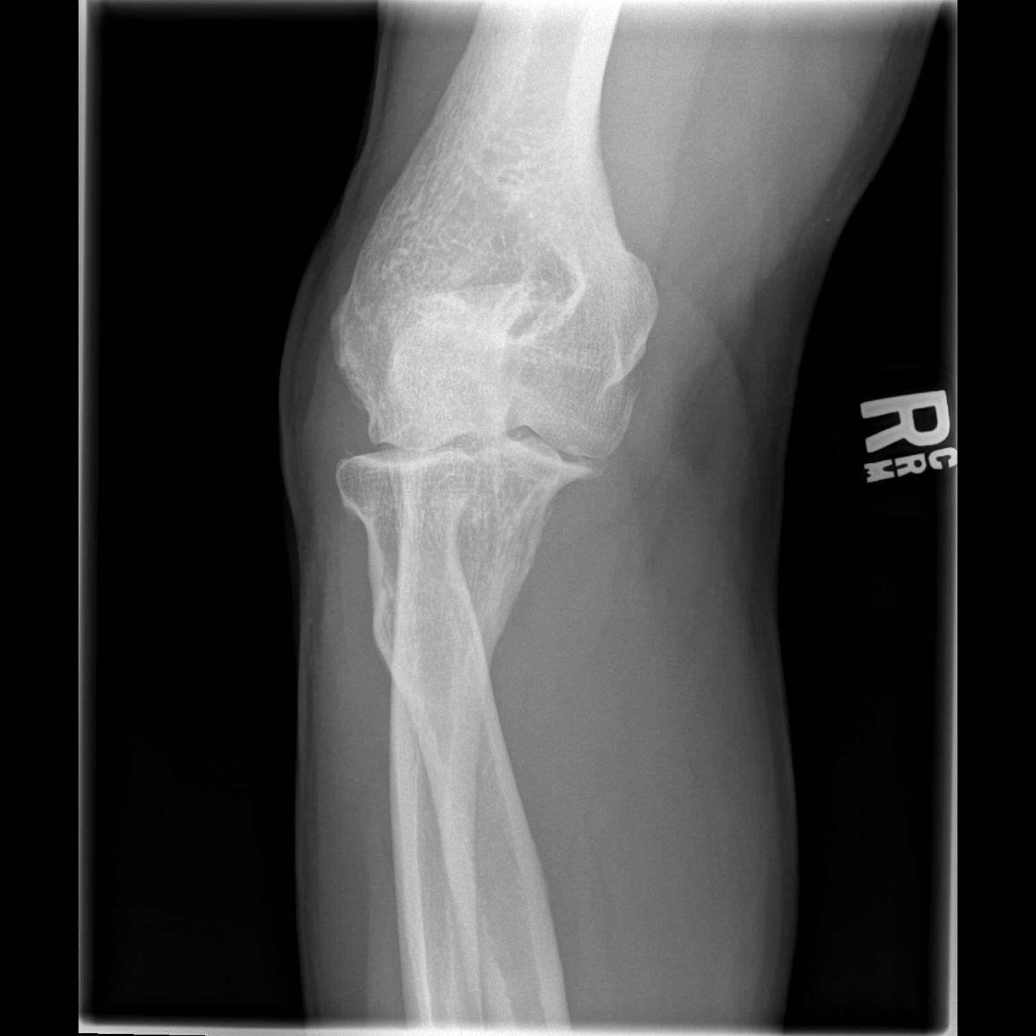

[x elbow joint lat right *]
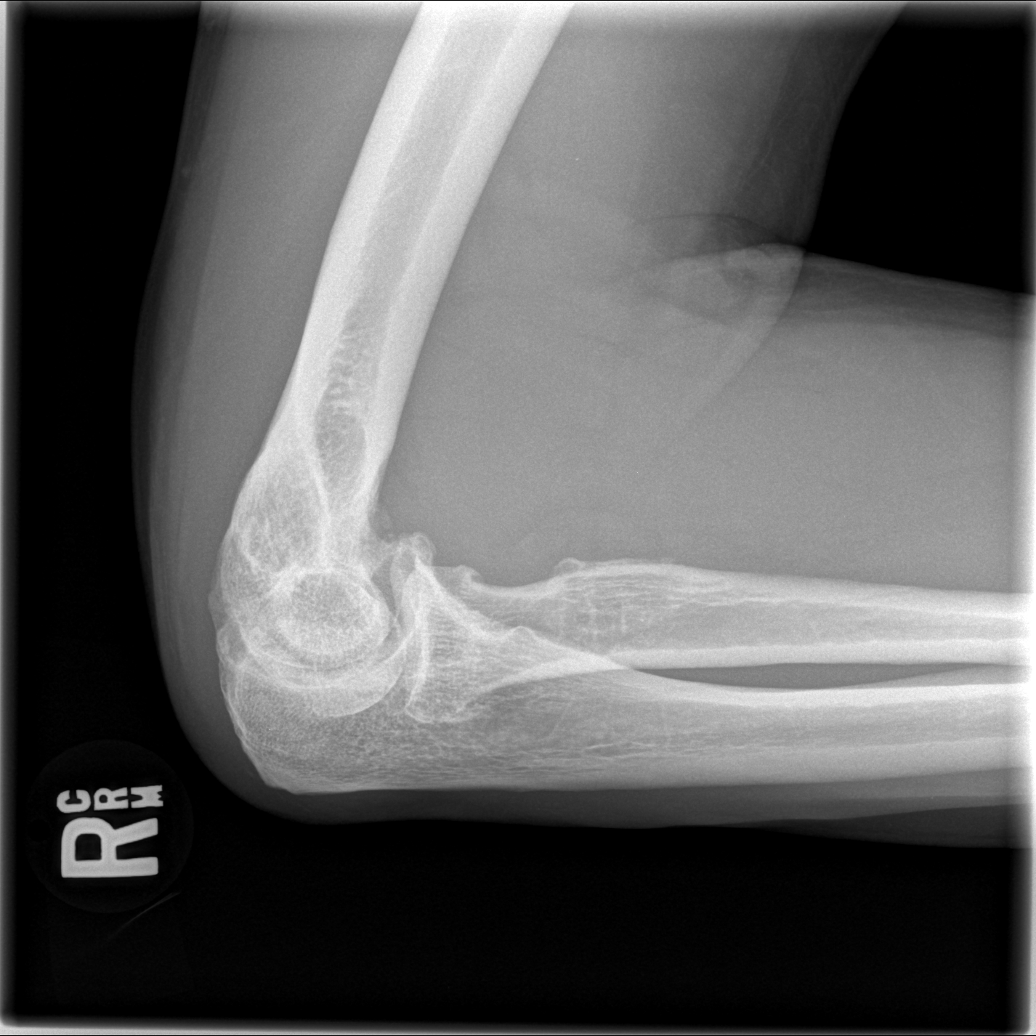

[4 of 4 positions shown; findings below may reference images not displayed]

FINDINGS: Mild degenerative changes are noted at the articulation of the
humerus and ulna. No acute fracture or dislocation is noted. Mild
spurring from the radial head is noted as well. Elevation of the
anterior fat pad consistent with a small effusion is noted.
IMPRESSION: Degenerative change with mild joint effusion. No acute bony
abnormality is seen.

## 2013-12-05 ENCOUNTER — Other Ambulatory Visit: Payer: Self-pay | Admitting: Family Medicine

## 2013-12-05 DIAGNOSIS — M25521 Pain in right elbow: Secondary | ICD-10-CM

## 2013-12-23 ENCOUNTER — Encounter: Payer: Self-pay | Admitting: Sports Medicine

## 2013-12-23 ENCOUNTER — Ambulatory Visit (INDEPENDENT_AMBULATORY_CARE_PROVIDER_SITE_OTHER): Payer: BC Managed Care – PPO | Admitting: Sports Medicine

## 2013-12-23 VITALS — BP 125/80 | Ht >= 80 in | Wt 264.0 lb

## 2013-12-23 DIAGNOSIS — M25529 Pain in unspecified elbow: Secondary | ICD-10-CM | POA: Diagnosis not present

## 2013-12-23 DIAGNOSIS — M25521 Pain in right elbow: Secondary | ICD-10-CM

## 2013-12-23 NOTE — Progress Notes (Signed)
   Subjective:    Patient ID: Thomas [name], male    DOB: 07-Jun-1950, 63 y.o.   MRN: [id number]  HPI Mr Ravenscroft is a 63yo right-hand dominant male who presents with right posterior elbow pain. Onset of symptoms was several years ago, but was worse over the past 3-4 weeks. 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 Location of symptoms is posterior right elbow over the medial olecranon. Character of pain is intermittent, sharp, stabbing. He denies any associated elbow swelling, locking, popping, fevers, chills, or rash. He tried NSAIDs with little relief. He was seen at the [facility] and was started on a Medrol DosePak, which has improved his symptoms 90%. Today, he states his pain is almost fully resolved.  An x-ray was obtained on 8/11 with degenerative changes and a posterior medial olecranon osteophyte formation was seen.  Review of Systems As per HPI, and 11 point ROS was obtained and was otherwise negative.    Objective:   Physical Exam BP 125/80  Ht 6\' 8"  (2.032 m)  Wt 264 lb (119.75 kg)  BMI 29.00 kg/m2 GEN: The patient is well-developed well-nourished male and in no acute distress.  He is awake alert and oriented x3. SKIN: warm and well-perfused, no rash  Neuro: Strength 5/5 globally. Sensation intact throughout. DTRs 2/4 bilaterally at biceps, triceps, and brachioradialis. No focal deficits. Vasc: +2 bilateral distal pulses. No edema.  MSK: Examination of the elbows reveals pain free ROM with lack of 10 degrees of extension bilaterally. No popping or catching is palpable in the right elbow. There is minimal right elbow swelling. TTP appreciable over the posterior medial aspect of the olecranon. No valgus or vagus laxity. No tenderness at the triceps insertion. Negative tinel's at the elbow.  Limited US: The right medial elbow was viewed in the long and short axis. A mild to moderate effusion is seen  posteriorly. A 5-29mm osteophyte is seen at the posterior medial olecranon process. No ulnar nerve subluxation.     Assessment & Plan:  1. Valgus Extension Overload Syndrome  Exam and radiographs consistent with valgus extension overload syndrome, likely precipitated by repetitive swinging of a heavy hammer. Similar injuries are seen in elite level baseball pitchers, or those who have repetitive forceful extension and valgus stress at the elbow.  -Given that the patient reports 90% improvement, we discussed symptom management with NSAIDs. He currently takes ibuprofen 800mg  bid, which he could increase to TID during times of exacerbation. -He could also try another Medrol DosePak if his symptoms return -If these interventions are not providing relief, then a CST injection would be possible. -The last option would be arthroscopic debridement or more aggressive orthopaedic interventions, but these would be reserved if the above therapies have failed. -He is limited in his ability to modify his duties, since he is required to perform hammer swinging as an integral part of his job. -He can also try icing or compression for support. -We will see him back if needed.

## 2014-01-19 ENCOUNTER — Encounter: Payer: Self-pay | Admitting: Family Medicine

## 2014-01-19 ENCOUNTER — Ambulatory Visit (INDEPENDENT_AMBULATORY_CARE_PROVIDER_SITE_OTHER): Payer: BC Managed Care – PPO | Admitting: Family Medicine

## 2014-01-19 VITALS — BP 145/87 | HR 79 | Temp 98.3°F | Ht 79.0 in | Wt 262.0 lb

## 2014-01-19 DIAGNOSIS — I10 Essential (primary) hypertension: Secondary | ICD-10-CM

## 2014-01-19 DIAGNOSIS — R195 Other fecal abnormalities: Secondary | ICD-10-CM

## 2014-01-19 DIAGNOSIS — M545 Low back pain, unspecified: Secondary | ICD-10-CM

## 2014-01-19 MED ORDER — CYCLOBENZAPRINE HCL 10 MG PO TABS: 10.0000 mg | ORAL_TABLET | Freq: Every evening | ORAL | Status: DC | PRN

## 2014-01-19 MED ORDER — HYDROCODONE-ACETAMINOPHEN 5-325 MG PO TABS: 1.0000 | ORAL_TABLET | Freq: Two times a day (BID) | ORAL | Status: DC | PRN

## 2014-01-19 NOTE — Assessment & Plan Note (Signed)
Discussed possibility of colon cancer and is very important he follow up with GI

## 2014-01-19 NOTE — Patient Instructions (Signed)
Good to see you today!  Thanks for coming in.  Check with you insurance about the shingles  Watch your blood pressure call if regularly > 140/90  Call [facility] and setup a colonoscopy

## 2014-01-19 NOTE — Assessment & Plan Note (Signed)
Stable on current regimen which allows him to work.  No signs of inappropriate use.  Refills given

## 2014-01-19 NOTE — Progress Notes (Signed)
   Subjective:    Patient ID: Thomas [name], male    DOB: [birth year], 63 y.o.   MRN: [id number]  HPI  Chronic Low Back Pain No change in symptoms.  Continues regimen of muscle relaxers, hydrocodone, ibuprofen and tylenol.  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  No falls or trouble with drowsiness  HYPERTENSION Disease Monitoring Home BP Monitoring has cuff does not regulalry check Chest pain- no    Dyspnea- no Medications Compliance-  daily. Lightheadedness-  no  Edema- no ROS - See HPI  PMH Lab Review   Potassium  Date Value Ref Range Status  06/04/2013 4.0  3.5 - 5.3 mEq/L Final     Sodium  Date Value Ref Range Status  06/04/2013 138  135 - 145 mEq/L Final     Creat  Date Value Ref Range Status  06/04/2013 1.18  0.50 - 1.35 mg/dL Final     Creatinine, Ser  Date Value Ref Range Status  03/13/2007 1.04   Final           Chief Complaint noted Review of Symptoms - see HPI PMH - Smoking status noted.   Vital Signs reviewed    Review of Systems     Objective:   Physical Exam Alert no acute distress Walks around room without difficulty        Assessment & Plan:

## 2014-01-19 NOTE — Assessment & Plan Note (Signed)
Stable on current acei.  Asked to monitor control at home

## 2014-01-26 ENCOUNTER — Other Ambulatory Visit: Payer: Self-pay | Admitting: *Deleted

## 2014-01-26 MED ORDER — PREDNISONE (PAK) 10 MG PO TABS: ORAL_TABLET | Freq: Every day | ORAL | Status: DC

## 2014-04-09 ENCOUNTER — Ambulatory Visit (INDEPENDENT_AMBULATORY_CARE_PROVIDER_SITE_OTHER): Payer: BC Managed Care – PPO | Admitting: Family Medicine

## 2014-04-09 ENCOUNTER — Encounter: Payer: Self-pay | Admitting: Family Medicine

## 2014-04-09 VITALS — BP 138/85 | HR 87 | Temp 98.0°F | Wt 254.0 lb

## 2014-04-09 DIAGNOSIS — L84 Corns and callosities: Secondary | ICD-10-CM

## 2014-04-09 DIAGNOSIS — M545 Low back pain, unspecified: Secondary | ICD-10-CM

## 2014-04-09 MED ORDER — HYDROCODONE-ACETAMINOPHEN 5-325 MG PO TABS: 1.0000 | ORAL_TABLET | Freq: Two times a day (BID) | ORAL | Status: DC | PRN

## 2014-04-09 MED ORDER — LISINOPRIL 40 MG PO TABS: 40.0000 mg | ORAL_TABLET | Freq: Every day | ORAL | Status: DC

## 2014-04-09 NOTE — Patient Instructions (Signed)
Lamisil AF spray for two weeks.  Use as directed on can.   Keep 4th and 5th toes separated with toe spreader or cotton 24 / 7 until site healed, then use just when wearing shoes.   Corns and Calluses Corns are small areas of thickened skin that usually occur on the top, sides, or tip of a toe. They contain a cone-shaped core with a point that can press on a nerve below. This causes pain. Calluses are areas of thickened skin that usually develop on hands, fingers, palms, soles of the feet, and heels. These are areas that experience frequent friction or pressure. CAUSES  Corns are usually the result of rubbing (friction) or pressure from shoes that are too tight or do not fit properly. Calluses are caused by repeated friction and pressure on the affected areas. SYMPTOMS  A hard growth on the skin.  Pain or tenderness under the skin.  Sometimes, redness and swelling.  Increased discomfort while wearing tight-fitting shoes. DIAGNOSIS  Your caregiver can usually tell what the problem is by doing a physical exam. TREATMENT  Removing the cause of the friction or pressure is usually the only treatment needed. However, sometimes medicines can be used to help soften the hardened, thickened areas. These medicines include salicylic acid plasters and 12% ammonium lactate lotion. These medicines should only be used under the direction of your caregiver. HOME CARE INSTRUCTIONS   Try to remove pressure from the affected area.  You may wear donut-shaped corn pads to protect your skin.  You may use a pumice stone or nonmetallic nail file to gently reduce the thickness of a corn.  Wear properly fitted footwear.  If you have calluses on the hands, wear gloves during activities that cause friction.  If you have diabetes, you should regularly examine your feet. Tell your caregiver if you notice any problems with your feet. SEEK IMMEDIATE MEDICAL CARE IF:   You have increased pain, swelling, redness, or  warmth in the affected area.  Your corn or callus starts to drain fluid or bleeds.  You are not getting better, even with treatment. Document Released: 01/15/2004 Document Revised: 07/03/2011 Document Reviewed: 12/06/2010 [facility] Patient Information 2015 [place], [place]. This information is not intended to replace advice given to you by your health care provider. Make sure you discuss any questions you have with your health care provider.

## 2014-04-10 ENCOUNTER — Encounter: Payer: Self-pay | Admitting: Family Medicine

## 2014-04-10 DIAGNOSIS — L84 Corns and callosities: Secondary | ICD-10-CM | POA: Insufficient documentation

## 2014-04-10 NOTE — Assessment & Plan Note (Signed)
New problem Right foot, 4th webspace.  Likely related to narrow work boots. Recommend wider work boots, Use of wicking toe spreader or cotton between 4th and 5th toes. Use of Lamisil AF spray as directed for two weeks  Keep area between toes dry and powdered.

## 2014-04-10 NOTE — Assessment & Plan Note (Addendum)
Patient requested slightly early refill of hydrocodone as he will have difficulty getting to [facility] during week of Christmas. Patient given one month prescription of his hydrococone-apap #60 tab.   Further refills through PCP  Dr Cammie Faulstich inadvertently printed patient's future 60 day hydrocodone prescription.  He destroyed this prescription.  It was not given to the patient.

## 2014-04-10 NOTE — Progress Notes (Signed)
   Subjective:    Patient ID: Thomas [name], male    DOB: 1951/01/09, 63 y.o.   MRN: [id number]  HPI  Right foot swelling Onset over last couple weeks About month ago patient self-lanced a "boil" on top of right foot at webspace of 4th and 5th toes.  Drained well and healed. Couple weeks ago patient noted redness and swelling in region where he lance boil. No fever. No pain.  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   No smoking No history of DIABETES or PAD     Review of Systems No rash  no change in nails    Objective:   Physical Exam VS reviewed. Gen: no acute distress, Alert, appropriate,  Right foot: no edema, no erythema.                    There is maceration of skin between 4th and 5th toes with a "wet corn".                     After removal of macerated skin with Q-tip, unable to probe below dermic.                     No pain withprobing.  No lymphatic streaking on foot dorsum.  No increase warmth in foot dorsum Palpable DP pedal pulse, good CR great toe.  Prominent 5th metatarsal head with overlying blushing erythema. Nontender.       Assessment & Plan:

## 2014-04-30 ENCOUNTER — Telehealth: Payer: Self-pay | Admitting: Family Medicine

## 2014-05-06 ENCOUNTER — Encounter: Payer: Self-pay | Admitting: Family Medicine

## 2014-05-06 DIAGNOSIS — G8929 Other chronic pain: Secondary | ICD-10-CM | POA: Insufficient documentation

## 2014-05-18 ENCOUNTER — Other Ambulatory Visit: Payer: Self-pay | Admitting: Family Medicine

## 2014-05-18 MED ORDER — HYDROCODONE-ACETAMINOPHEN 5-325 MG PO TABS: 1.0000 | ORAL_TABLET | Freq: Two times a day (BID) | ORAL | Status: DC | PRN

## 2014-05-18 NOTE — Telephone Encounter (Signed)
LM for patient that rx is ready for pick up.  Also asked him to make an appt for more refills. Jazmin Hartsell,CMA

## 2014-05-18 NOTE — Telephone Encounter (Signed)
Please ask Dr Raymondo Band to write a rx for him for one month  Let patient know he needs an appointment with me before more refills  thasnks  Peck

## 2014-05-18 NOTE — Telephone Encounter (Signed)
Please let patient know her prescription(s) are available for pick up from the [facility] front desk.  

## 2014-05-18 NOTE — Telephone Encounter (Signed)
Needs refill on hydrocodone Please advise

## 2014-05-25 ENCOUNTER — Telehealth: Payer: Self-pay | Admitting: Family Medicine

## 2014-05-25 NOTE — Telephone Encounter (Signed)
Pt called because he is once again having pain in his elbow. He wanted to know if Dr. Berkley Harvey can call in a refill on his 50 mg of Prednisone. jw

## 2014-05-26 ENCOUNTER — Other Ambulatory Visit: Payer: Self-pay | Admitting: Family Medicine

## 2014-05-26 DIAGNOSIS — M25521 Pain in right elbow: Secondary | ICD-10-CM

## 2014-05-26 MED ORDER — PREDNISONE 50 MG PO TABS: ORAL_TABLET | ORAL | Status: DC

## 2014-05-26 NOTE — Telephone Encounter (Signed)
Called and discussed with patient. He continues taking 800 mg ibuprofen BID and tylenol BID as well. Pain improved with previous steroid dose pack but not as much as previous prednisone 50 mg x 5 days. Refilled prednisone 50 mg x 5 days. Advised precaution with coadministration on NSAIDs and warning against GI bleed. Advised f/u with PCP to discuss other options for chronic elbow pain management. Ultram vs Chronic NSAIDs; sport medicine referral for steroid injection vs ortho referral for possible surgical debridement. He will discuss with PCP.

## 2014-06-16 ENCOUNTER — Ambulatory Visit (INDEPENDENT_AMBULATORY_CARE_PROVIDER_SITE_OTHER): Payer: BLUE CROSS/BLUE SHIELD | Admitting: Podiatry

## 2014-06-16 ENCOUNTER — Encounter: Payer: Self-pay | Admitting: Podiatry

## 2014-06-16 ENCOUNTER — Ambulatory Visit: Payer: Self-pay

## 2014-06-16 ENCOUNTER — Telehealth: Payer: Self-pay | Admitting: *Deleted

## 2014-06-16 ENCOUNTER — Encounter: Payer: Self-pay | Admitting: Internal Medicine

## 2014-06-16 VITALS — BP 145/90 | HR 80 | Resp 16 | Ht 79.0 in | Wt 254.0 lb

## 2014-06-16 DIAGNOSIS — L89891 Pressure ulcer of other site, stage 1: Secondary | ICD-10-CM | POA: Diagnosis not present

## 2014-06-16 DIAGNOSIS — L0291 Cutaneous abscess, unspecified: Secondary | ICD-10-CM

## 2014-06-16 MED ORDER — MUPIROCIN 2 % EX OINT: TOPICAL_OINTMENT | CUTANEOUS | Status: DC

## 2014-06-16 MED ORDER — DOXYCYCLINE HYCLATE 150 MG PO TABS: 1.0000 | ORAL_TABLET | Freq: Three times a day (TID) | ORAL | Status: DC

## 2014-06-16 MED ORDER — AMOXICILLIN-POT CLAVULANATE 875-125 MG PO TABS: 1.0000 | ORAL_TABLET | Freq: Two times a day (BID) | ORAL | Status: DC

## 2014-06-16 NOTE — Telephone Encounter (Signed)
Pharmacist states Doxycycline 150mg  is not available except in delayed release or could substitute 50mg  capsules 3 tid.  Dr Milinda Pointer ordered discontinue the Doxycycline, order Augmentin 875mg  1 tablet bid.

## 2014-06-16 NOTE — Progress Notes (Signed)
   Subjective:    Patient ID: Thomas [name], male    DOB: 1951/04/17, 64 y.o.   MRN: [id number]  HPI Comments: Had a fungal infection in the right foot that started in November that has not cleared up yet. Using over the counter lamisil spray. No other treatment for it , its painful. On feet all day. Between the fourth and fifth toes right foot .   Foot Pain      Review of Systems  All other systems reviewed and are negative.      Objective:   Physical Exam: I have reviewed his past medical history medications allergies surgery social history and review of systems. Pulses are strongly palpable bilateral. Neurologic sensorium is intact per Semmes-Weinstein monofilament. Deep tendon reflexes are intact bilateral and muscle strength is 5 over 5 dorsiflexion plantar flexors and inverters and everters all intrinsic musculature is intact. Orthopedic evaluation demonstrates mild hammertoe deformities bilateral. Deep sulcus is to the interdigital spaces bilateral. Cutaneous evaluation demonstrates supple well-hydrated cutis he does have thick dystrophic probably mycotic nails as well. He also has a area of erythema overlying the fourth interdigital space with soft tissue maceration and ulceration to the medial aspect of the fifth digit right foot. It does appear that he has a superficial ulceration with cellulitis.        Assessment & Plan:  Assessment: Ulceration cellulitis sulcus fourth interdigital space right foot.  Plan: Discussed etiology pathology conservative versus surgical therapies. Debridement all reactive hyperkeratoses today debrided to bleeding. Applied Silvadene cream and a Telfa dressing. We wrote a prescription for Augmentin 875 mg 1 by mouth twice a day 10 days and we also wrote a prescription for Bactroban ointment to be applied after soaking twice daily. I will follow up with him in a couple of weeks before he leaves to go out of town.  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

## 2014-06-16 NOTE — Patient Instructions (Addendum)
Soak Instructions    THE DAY AFTER THE PROCEDURE  Place 1/4 cup of epsom salts in a quart of warm tap water.  Submerge your foot or feet with outer bandage intact for the initial soak; this will allow the bandage to become moist and wet for easy lift off.  Once you remove your bandage, continue to soak in the solution for 20 minutes.  This soak should be done twice a day.  Next, remove your foot or feet from solution, blot dry the affected area and cover.  You may use a band aid large enough to cover the area or use gauze and tape.  Apply other medications to the area as directed by the doctor.  IF YOUR SKIN BECOMES IRRITATED WHILE USING THESE INSTRUCTIONS, IT IS OKAY TO SWITCH TO  WHITE VINEGAR AND WATER. Or you may use antibacterial soap and water to keep the toe clean  Monitor for any signs/symptoms of infection. Call the office immediately if any occur or go directly to the emergency room. Call with any questions/concerns.

## 2014-06-17 ENCOUNTER — Ambulatory Visit (INDEPENDENT_AMBULATORY_CARE_PROVIDER_SITE_OTHER): Payer: BLUE CROSS/BLUE SHIELD | Admitting: Family Medicine

## 2014-06-17 ENCOUNTER — Encounter: Payer: Self-pay | Admitting: Family Medicine

## 2014-06-17 VITALS — BP 148/82 | HR 87 | Temp 98.2°F | Wt 250.4 lb

## 2014-06-17 DIAGNOSIS — R195 Other fecal abnormalities: Secondary | ICD-10-CM

## 2014-06-17 DIAGNOSIS — I10 Essential (primary) hypertension: Secondary | ICD-10-CM

## 2014-06-17 DIAGNOSIS — R739 Hyperglycemia, unspecified: Secondary | ICD-10-CM

## 2014-06-17 DIAGNOSIS — M545 Low back pain, unspecified: Secondary | ICD-10-CM

## 2014-06-17 LAB — BASIC METABOLIC PANEL
BUN: 23 mg/dL (ref 6–23)
CO2: 29 mEq/L (ref 19–32)
Calcium: 9.5 mg/dL (ref 8.4–10.5)
Chloride: 99 mEq/L (ref 96–112)
Creat: 1 mg/dL (ref 0.50–1.35)
Glucose, Bld: 146 mg/dL — ABNORMAL HIGH (ref 70–99)
Potassium: 4.5 mEq/L (ref 3.5–5.3)
Sodium: 138 mEq/L (ref 135–145)

## 2014-06-17 MED ORDER — HYDROCODONE-ACETAMINOPHEN 5-325 MG PO TABS: 1.0000 | ORAL_TABLET | Freq: Two times a day (BID) | ORAL | Status: DC | PRN

## 2014-06-17 NOTE — Patient Instructions (Signed)
Good to see you today!  Thanks for coming in.  Check your blood pressure it should usually be < 140/90 if not call us  I will call you if your tests are not good.  Otherwise I will send you a letter.  If you do not hear from me with in 2 weeks please call our office.     Check about the shingles shot  Good luck with the colonoscopy  Make an appointment to see me before you need more hydrocodone refills

## 2014-06-17 NOTE — Assessment & Plan Note (Signed)
Indication for chronic opioid: degenerative joint disease low back pan Medication and dose: see meds # pills per month: see meds Last UDS date: NI Pain contract signed (Y/N): NI Date narcotic database last reviewed (include red flags):

## 2014-06-17 NOTE — Assessment & Plan Note (Signed)
Has colonoscopy scheduled

## 2014-06-17 NOTE — Progress Notes (Signed)
   Subjective:    Patient ID: Thomas [name], male    DOB: 02/24/1951, 64 y.o.   MRN: [id number]  HPI  HYPERTENSION Disease Monitoring Home BP Monitoring usally < 140/90 drank a lot of coffee this am and has usual claustophobia in office Chest pain- no    Dyspnea- no Medications Compliance-  Daily 40 mg lisinopril. Lightheadedness-  no  Edema- no ROS - See HPI  PMH Lab Review   POTASSIUM  Date Value Ref Range Status  06/04/2013 4.0 3.5 - 5.3 mEq/L Final   SODIUM  Date Value Ref Range Status  06/04/2013 138 135 - 145 mEq/L Final   CREAT  Date Value Ref Range Status  06/04/2013 1.18 0.50 - 1.35 mg/dL Final   CREATININE, SER  Date Value Ref Range Status  03/13/2007 1.04  Final        Chronic Arthritis Pain Taking hydrocodone regularly about 2 per day.  Some days takes less if not having worked hard.  Using tylenol and ibuprofen as needed. Not problems with medications which enable him to work at his [employer] job  Elevated PSA - following with urrology  Chief Complaint noted Review of Symptoms - see HPI PMH - Smoking status noted.   Vital Signs reviewed   Review of Systems     Objective:   Physical Exam  Alert no acute distress Heart - Regular rate and rhythm.  No murmurs, gallops or rubs.    Lungs:  Normal respiratory effort, chest expands symmetrically. Lungs are clear to auscultation, no crackles or wheezes. Extremities:  No cyanosis, edema, or deformity noted with good range of motion of all major joints.         Assessment & Plan:

## 2014-06-17 NOTE — Assessment & Plan Note (Signed)
Sounds to be controlled at home.  Has a component of white coat due to his claustophobia in office.  Check labs

## 2014-06-19 ENCOUNTER — Encounter: Payer: Self-pay | Admitting: Family Medicine

## 2014-06-19 NOTE — Addendum Note (Signed)
Addended by: Talbert Cage L on: 06/19/2014 08:58 AM   Modules accepted: Orders

## 2014-06-24 ENCOUNTER — Ambulatory Visit (AMBULATORY_SURGERY_CENTER): Payer: Self-pay | Admitting: *Deleted

## 2014-06-24 VITALS — Ht 79.0 in | Wt 252.6 lb

## 2014-06-24 DIAGNOSIS — Z1211 Encounter for screening for malignant neoplasm of colon: Secondary | ICD-10-CM

## 2014-06-24 NOTE — Progress Notes (Signed)
No issues with past sedation No egg or soy allergy No diet pills No home 02 use emmi declined

## 2014-06-30 ENCOUNTER — Encounter: Payer: Self-pay | Admitting: Podiatry

## 2014-06-30 ENCOUNTER — Ambulatory Visit (INDEPENDENT_AMBULATORY_CARE_PROVIDER_SITE_OTHER): Payer: BLUE CROSS/BLUE SHIELD | Admitting: Podiatry

## 2014-06-30 VITALS — BP 140/82 | HR 79 | Resp 18

## 2014-06-30 DIAGNOSIS — L0291 Cutaneous abscess, unspecified: Secondary | ICD-10-CM | POA: Diagnosis not present

## 2014-06-30 MED ORDER — AMOXICILLIN-POT CLAVULANATE 875-125 MG PO TABS: 1.0000 | ORAL_TABLET | Freq: Two times a day (BID) | ORAL | Status: DC

## 2014-06-30 NOTE — Progress Notes (Signed)
He presents today for follow-up of an HM fourth interdigital space of the right foot. He states that the redness and pain has subsided with antibiotics. He states that he gets to soak it at least once a day.  Objective: Vital signs are stable he is alert and oriented 3. He denies fever chills nausea probably muscle aches and pains. Pulses are palpable right foot. Superficial ulceration overlying the medial aspect of the fifth toe and the proximal sulcus area area this does not probe deep to bone.  Assessment: Heloma molle fourth interdigital space right foot. Cellulitis has resolved.  Plan: Continue conservative therapy such as soaking and keeping the toe separated. We did discuss the need for future surgery consisting of a syndactylization and arthroplasty.

## 2014-07-04 ENCOUNTER — Other Ambulatory Visit: Payer: Self-pay | Admitting: Family Medicine

## 2014-07-10 ENCOUNTER — Encounter: Payer: Self-pay | Admitting: Internal Medicine

## 2014-07-14 ENCOUNTER — Ambulatory Visit (INDEPENDENT_AMBULATORY_CARE_PROVIDER_SITE_OTHER): Payer: BLUE CROSS/BLUE SHIELD | Admitting: Sports Medicine

## 2014-07-14 ENCOUNTER — Encounter: Payer: Self-pay | Admitting: Sports Medicine

## 2014-07-14 VITALS — BP 142/102 | Ht 79.0 in | Wt 254.0 lb

## 2014-07-14 DIAGNOSIS — M25562 Pain in left knee: Secondary | ICD-10-CM | POA: Diagnosis not present

## 2014-07-14 DIAGNOSIS — M25561 Pain in right knee: Secondary | ICD-10-CM | POA: Diagnosis not present

## 2014-07-14 DIAGNOSIS — M174 Other bilateral secondary osteoarthritis of knee: Secondary | ICD-10-CM | POA: Insufficient documentation

## 2014-07-14 MED ORDER — PREDNISONE 50 MG PO TABS: ORAL_TABLET | ORAL | Status: DC

## 2014-07-14 NOTE — Progress Notes (Signed)
   Subjective:    Patient ID: Thomas [name], male    DOB: 27-Dec-1950, 64 y.o.   MRN: [id number]  HPI Mr. Golebiewski is a 64 year old male who presents with concern over bilateral knee pain. Onset was several years ago without any known acute injury. 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 He has a known prior history of bilateral knee osteoarthritis for which his previous provider had been discussing knee replacement surgery. 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 Last fall he was on a Medrol Dosepak for his elbow, which evidently also helped his bilateral knee pain. Location of knee pain is primarily anterior bilateral knees, right greater than left. He notes occasional swelling and sensation of giving way. He denies any locking or catching. Symptoms are aggravated with sitting for prolonged periods of time and relieved with rest and elevation. He is on 800 mg ibuprofen twice daily, acetaminophen twice daily, as well as Norco to manage his various pain symptoms. He is hopeful to avoid knee replacement surgery. He has never had injections or therapy for his knee pain. We do not have any recent x-rays.  Past medical history, social history, medications, and allergies were reviewed and are up to date in the chart.  Review of Systems 7 point review of systems was performed and was otherwise negative unless noted in the history of present illness.     Objective:   Physical Exam BP 142/102 mmHg  Ht 6\' 7"  (2.007 m)  Wt 254 lb (115.214 kg)  BMI 28.60 kg/m2 GEN: The patient is well-developed well-nourished male and in no acute distress.  He is awake alert and oriented x3. SKIN: warm and well-perfused, no rash  EXTR: No lower extremity edema or calf tenderness Neuro: Strength 5/5 globally. Sensation intact throughout. No focal  deficits. Vasc: +2 bilateral distal pulses. No edema.  MSK: Examination of the bilateral knees reveals no effusion, warmth, erythema, or rash. Range of motion of the right knee is from 5-120 with pain and crepitus. Range of motion of the left knee is from 0-120 with pain and crepitus. He has a slight varus deformity bilaterally. No instability with valgus or varus stress testing. Positive patellar grind test bilaterally. Medial joint line tenderness to palpation bilaterally. Fairly good quadriceps strength. No atrophy.    Assessment & Plan:  Please see problem based assessment and plan in the problem list.

## 2014-07-14 NOTE — Assessment & Plan Note (Signed)
Most likely due to bilateral knee osteoarthritis. -We'll obtain standing bilateral knee x-rays, as we did not have any in our system. -Treatment options were discussed including bilateral knee corticosteroid injection versus Visco supplementation versus knee replacement surgery. The patient is hopeful to avoid need for knee replacement at this time. -The patient opted for repeat dosing of prednisone 50 mg once daily for 5 days, which has helped in the past. -I counseled him to avoid use of his ibuprofen until he is done with the prednisone Dosepak. -I offered him a knee brace or compression sleeve, but he will obtain this over-the-counter. -He will also rest, ice, and elevate his knees. He'll avoid deep lunges or squats with possible, though this may be difficult given his occupation. -He will follow-up on an as-needed basis, and if his pain persists we may consider corticosteroid injection.

## 2014-07-15 ENCOUNTER — Other Ambulatory Visit: Payer: Self-pay | Admitting: Family Medicine

## 2014-07-16 ENCOUNTER — Telehealth: Payer: Self-pay | Admitting: Family Medicine

## 2014-07-16 NOTE — Telephone Encounter (Signed)
Called pt and he stated he needed refill for Viagra. Will forward to PCP for review.  [facility] Dr. Oletta Lamas, CMA.

## 2014-07-16 NOTE — Telephone Encounter (Signed)
Needs a refill on " personal medication" Wants to talk to Dr Brooke Dare nurse [phone]

## 2014-07-28 ENCOUNTER — Encounter: Payer: Self-pay | Admitting: Internal Medicine

## 2014-07-29 ENCOUNTER — Ambulatory Visit
Admission: RE | Admit: 2014-07-29 | Discharge: 2014-07-29 | Disposition: A | Discharge status: Home / Self Care | Payer: BLUE CROSS/BLUE SHIELD | Source: Ambulatory Visit | Attending: Sports Medicine | Admitting: Sports Medicine

## 2014-07-29 DIAGNOSIS — M25562 Pain in left knee: Principal | ICD-10-CM

## 2014-07-29 DIAGNOSIS — M25561 Pain in right knee: Secondary | ICD-10-CM

## 2014-07-29 IMAGING — CR DG KNEE COMPLETE 4+V*R*
5 series · 5 of 5 positions shown · non-contrast
Comparison: None.

CLINICAL DATA: Knee pain for several years. No history of injury.
Initial evaluation.

EXAM:
RIGHT KNEE - COMPLETE 4+ VIEW

[w knee ap right]
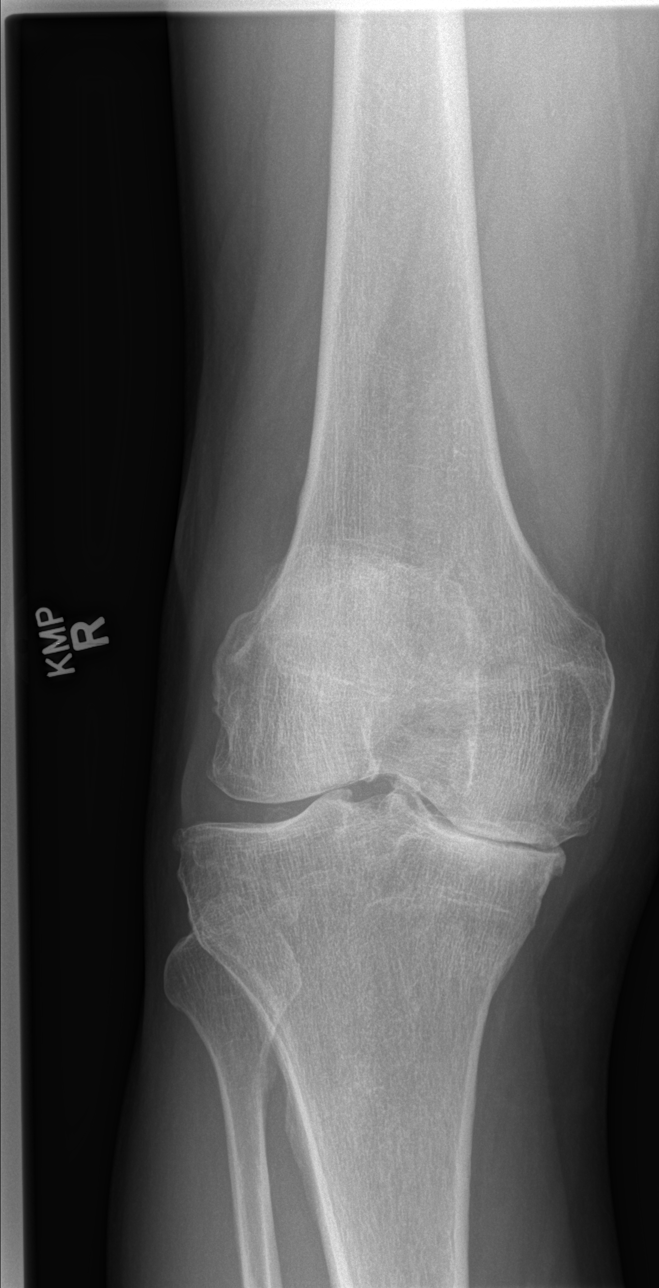

[w knee obl. right]
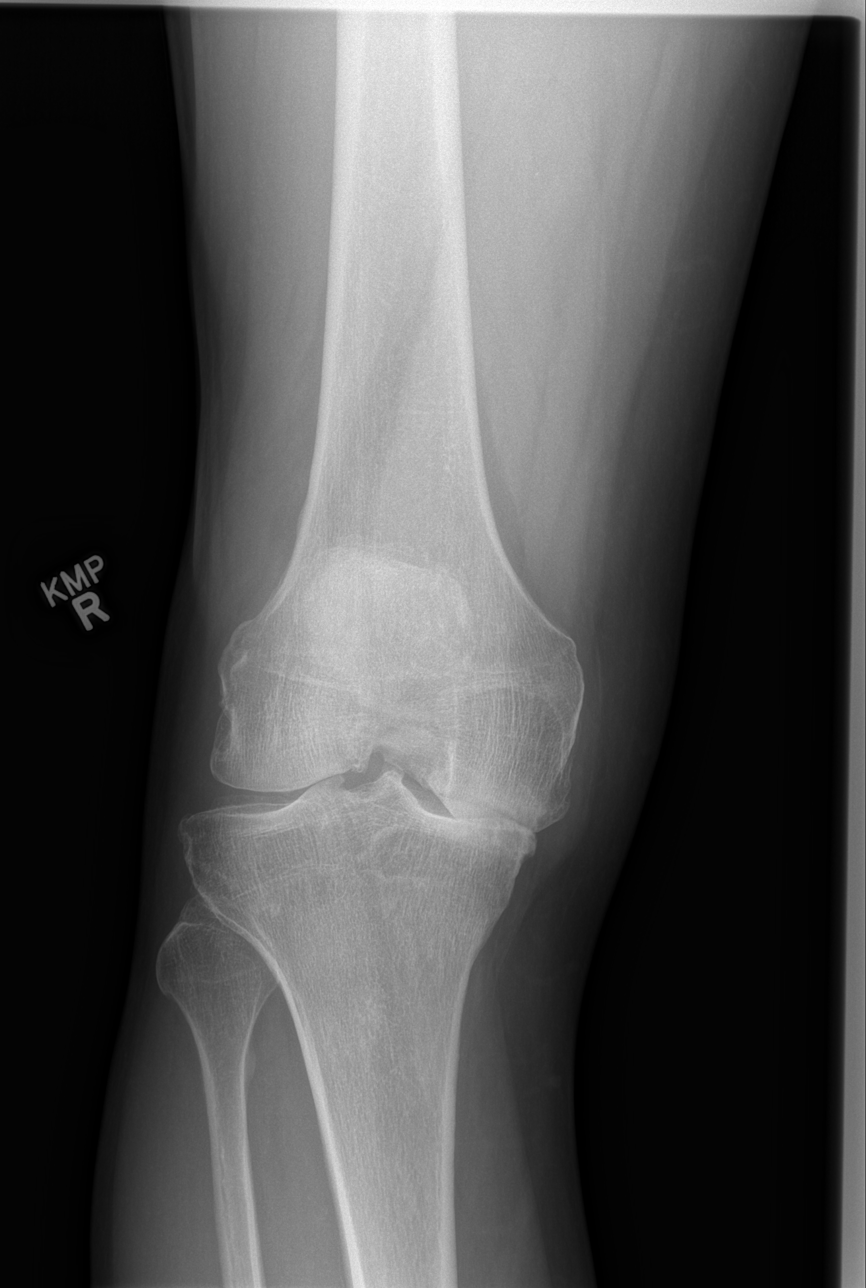

[w knee lat. right (1 of 2)]
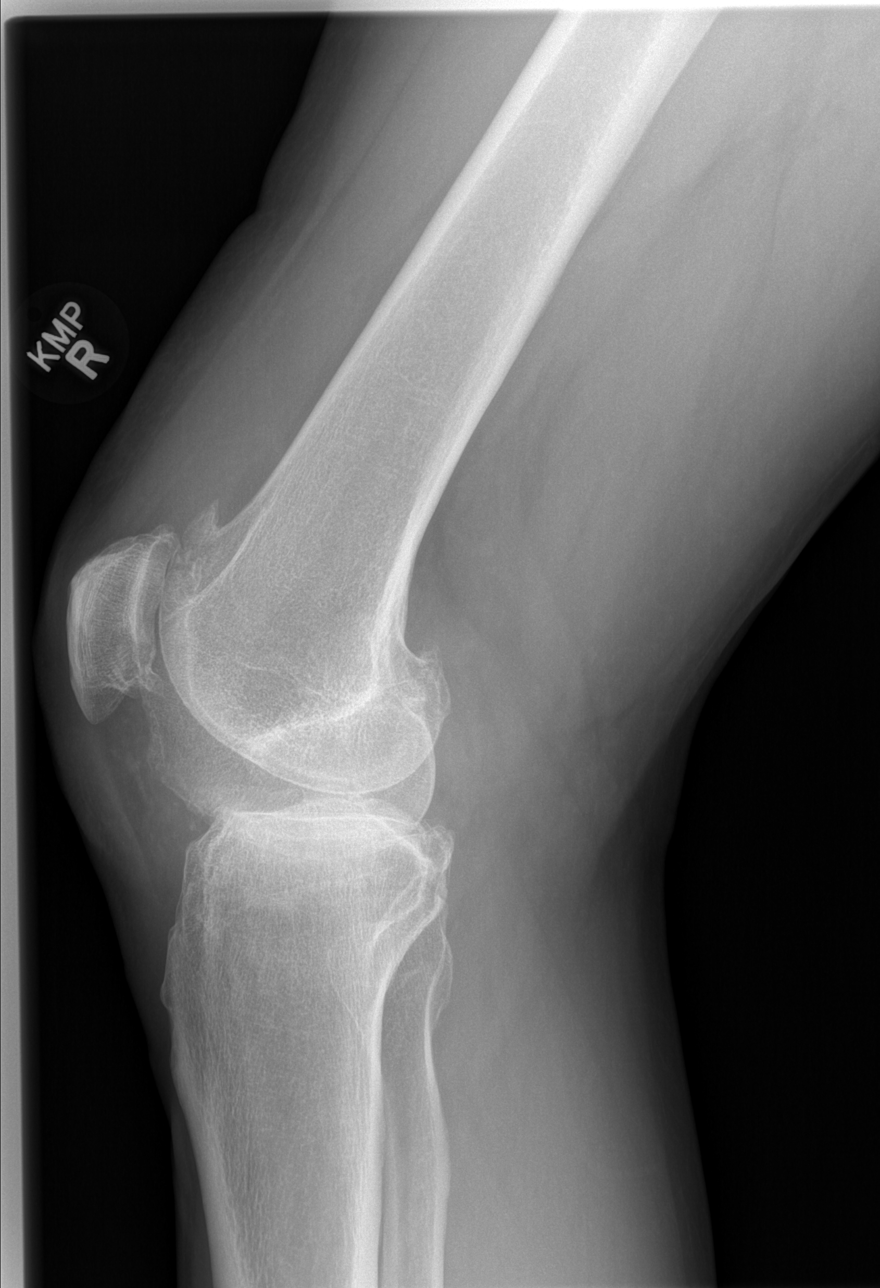

[w knee lat. right (2 of 2)]
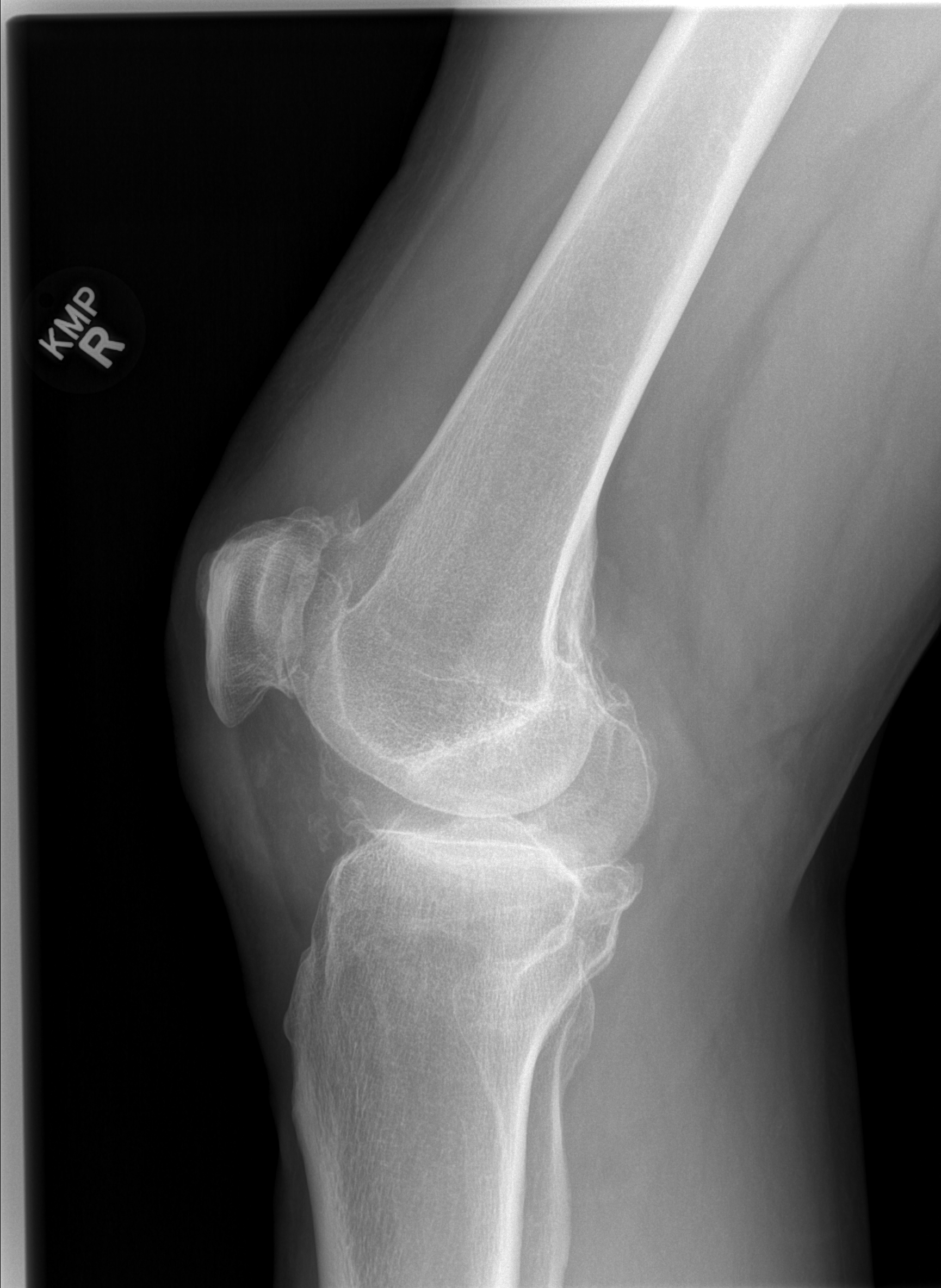

[view not recorded]
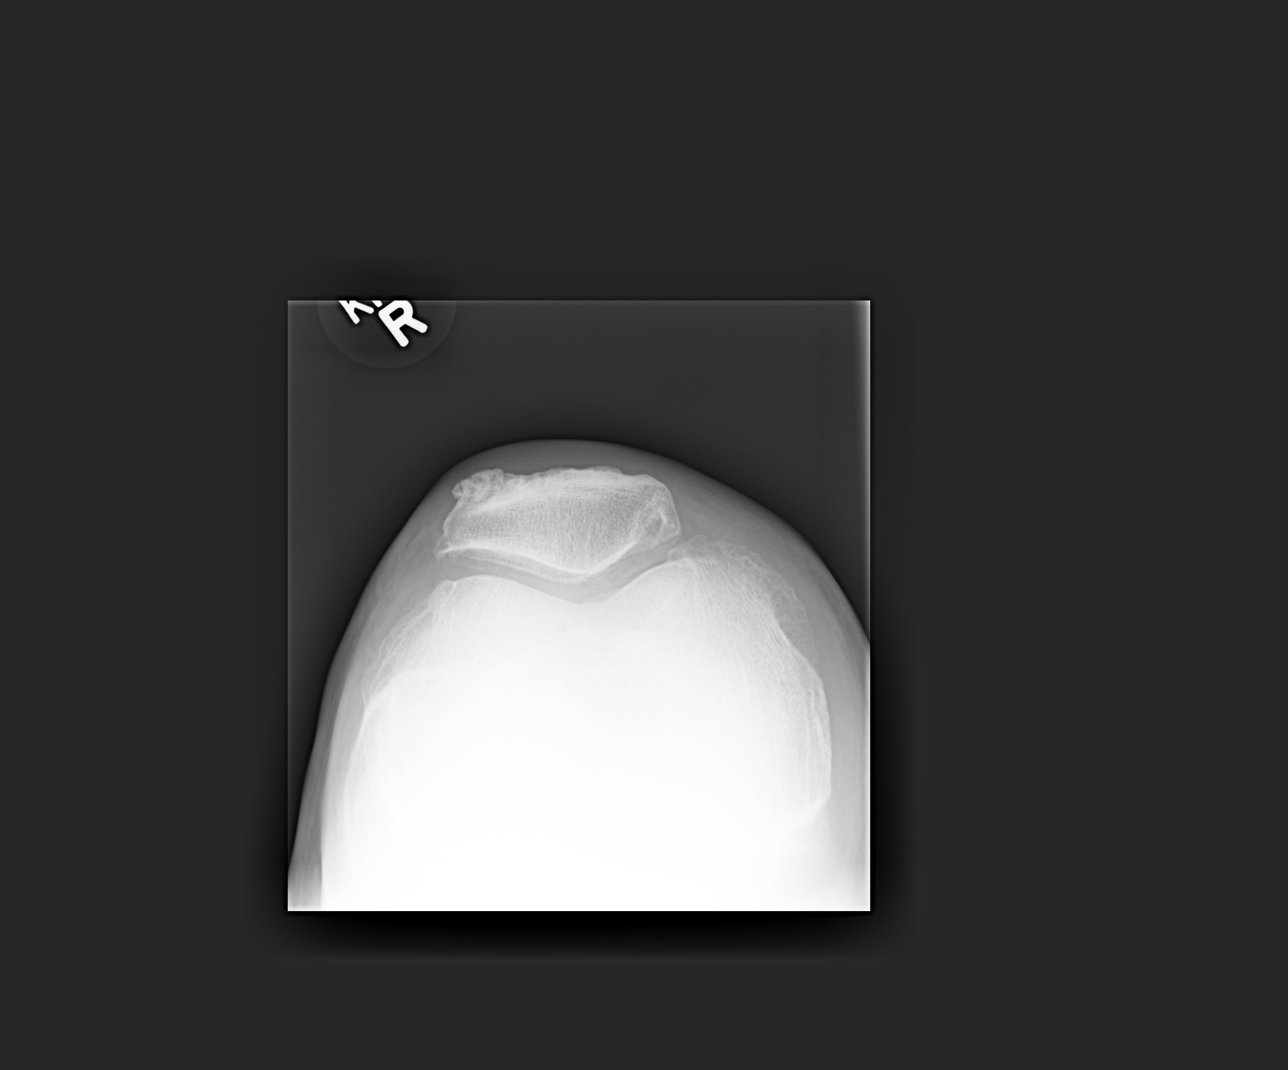

[5 of 5 positions shown; findings below may reference images not displayed]

FINDINGS: Severe tricompartment degenerative change present. Degenerative
changes most severe about the medial and patellofemoral
compartments. Small loose bodies cannot be excluded . No prominent
effusion. Small effusion cannot be excluded.
IMPRESSION: Severe tricompartment degenerative change, particularly severe about
the medial and patellofemoral compartments. Tiny loose bodies cannot
be excluded.

## 2014-07-29 IMAGING — CR DG KNEE COMPLETE 4+V*L*
4 series · 4 of 4 positions shown · non-contrast
Comparison: None.

CLINICAL DATA: Knee joint pain for several years. Initial
evaluation.

EXAM:
LEFT KNEE - COMPLETE 4+ VIEW

[w knee ap left]
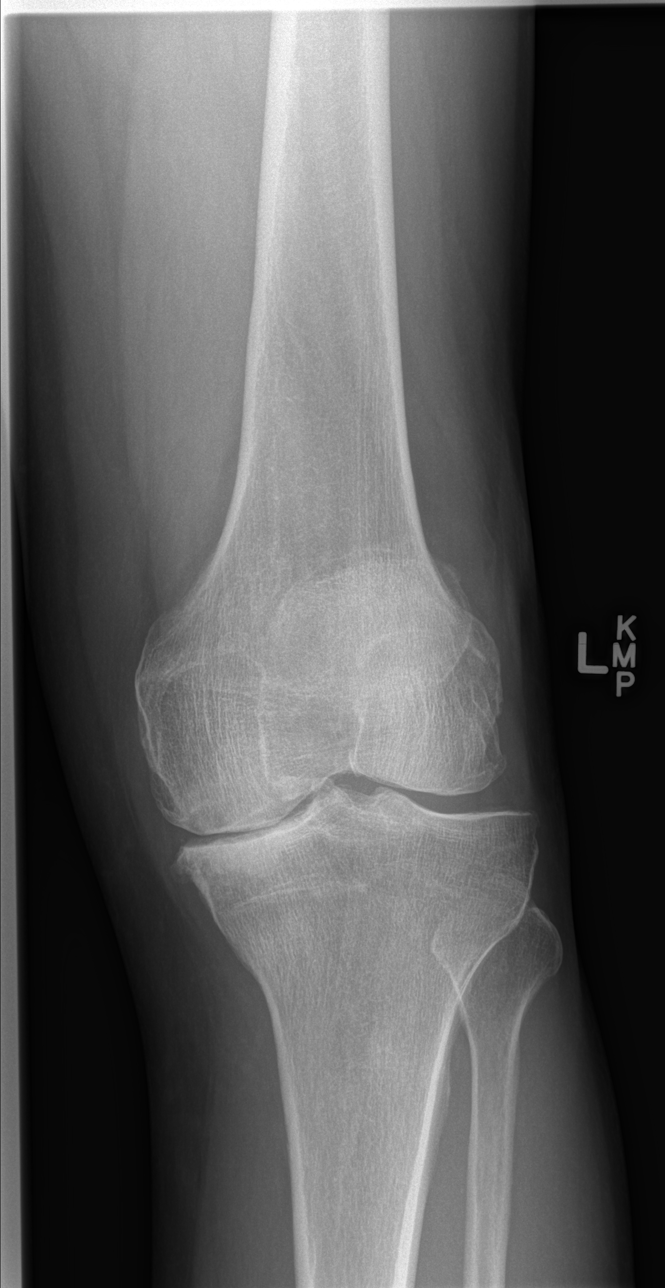

[w knee obl. left (1 of 2)]
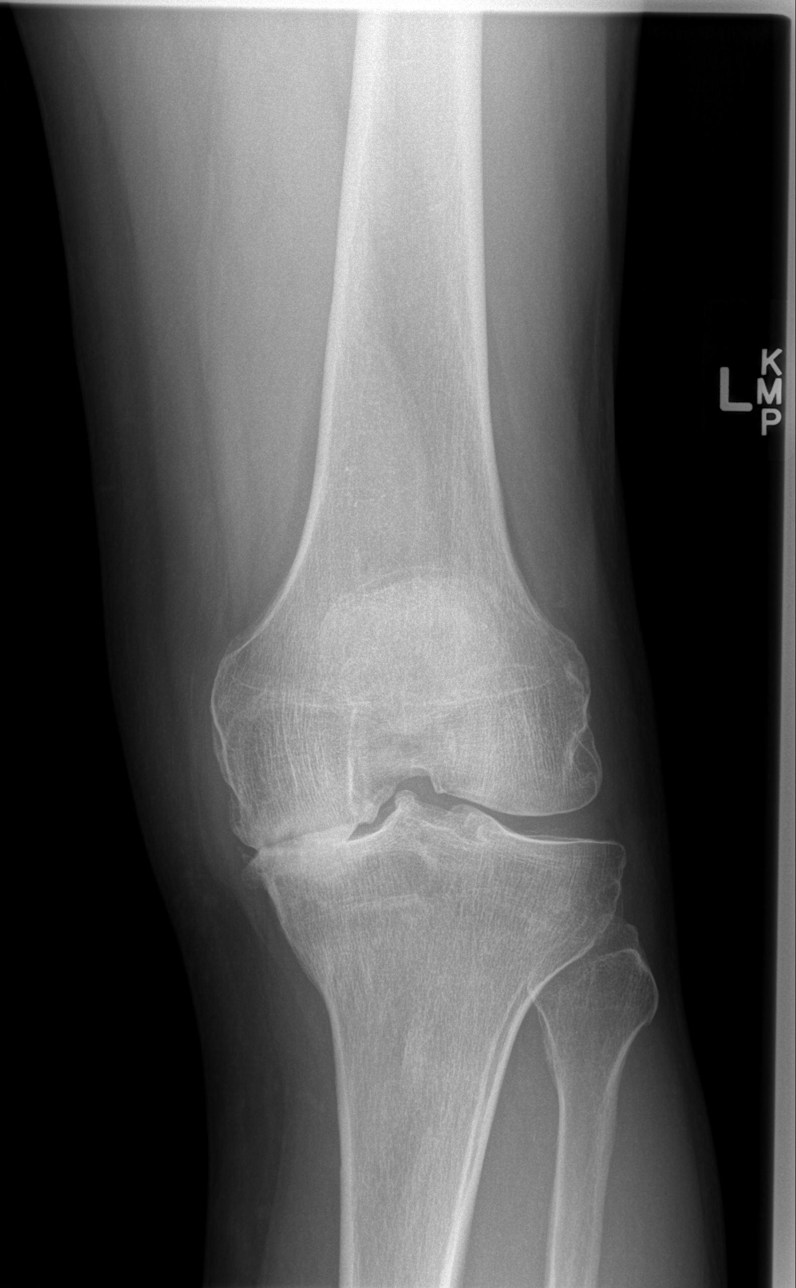

[w knee obl. left (2 of 2)]
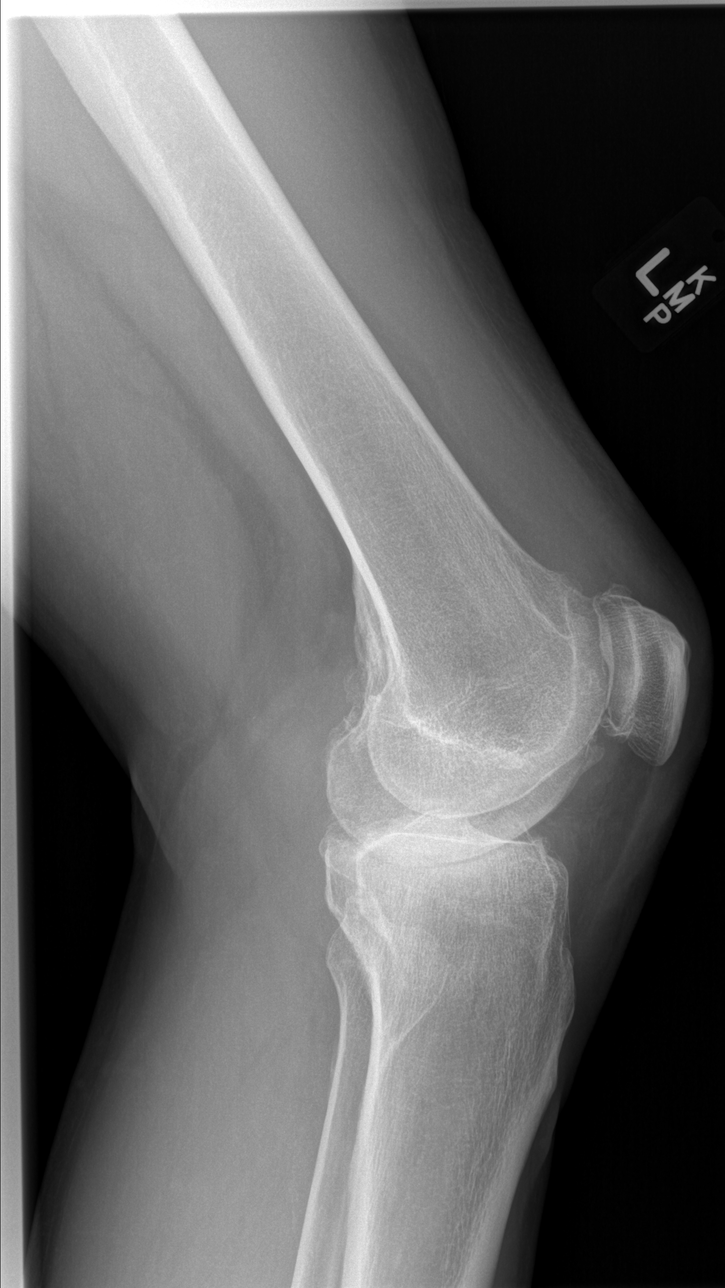

[view not recorded]
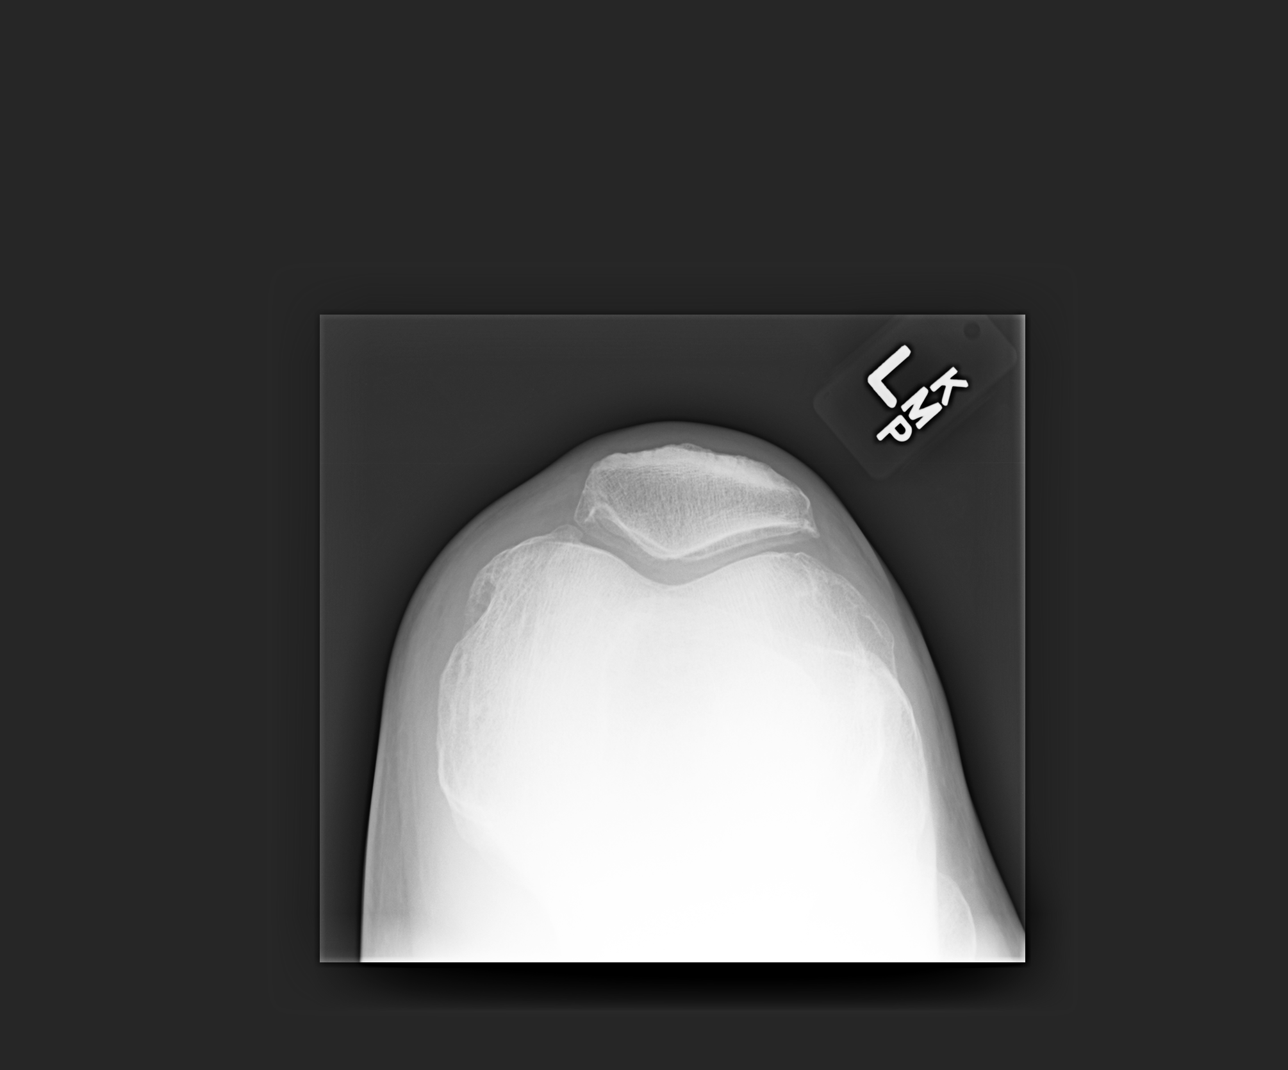

[4 of 4 positions shown; findings below may reference images not displayed]

FINDINGS: Prominent tricompartment degenerative change noted. Degenerative
changes are particularly severe about the medial compartment. No
evidence of fracture or dislocation. No prominent effusion. Tiny
effusion cannot be excluded.
IMPRESSION: Severe tricompartment degenerative change. Degenerative changes are
particularly prominent along the medial compartment.

## 2014-07-30 ENCOUNTER — Telehealth: Payer: Self-pay | Admitting: Family Medicine

## 2014-07-30 DIAGNOSIS — R739 Hyperglycemia, unspecified: Secondary | ICD-10-CM

## 2014-07-30 NOTE — Telephone Encounter (Signed)
Will forward to PCP for review. Erwin Nishiyama, CMA. 

## 2014-07-30 NOTE — Telephone Encounter (Signed)
Please look at xrays taken yesterday and call patient with results.

## 2014-07-31 NOTE — Telephone Encounter (Signed)
Spoke w him Discussed xray was as expected.  Reminded to get blood sugar test   He agrees

## 2014-08-10 ENCOUNTER — Other Ambulatory Visit (INDEPENDENT_AMBULATORY_CARE_PROVIDER_SITE_OTHER): Payer: BLUE CROSS/BLUE SHIELD

## 2014-08-10 ENCOUNTER — Encounter: Payer: Self-pay | Admitting: Family Medicine

## 2014-08-10 DIAGNOSIS — R739 Hyperglycemia, unspecified: Secondary | ICD-10-CM | POA: Diagnosis not present

## 2014-08-10 LAB — POCT GLYCOSYLATED HEMOGLOBIN (HGB A1C): Hemoglobin A1C: 5.6

## 2014-08-10 LAB — GLUCOSE, CAPILLARY: Glucose-Capillary: 94 mg/dL (ref 70–99)

## 2014-08-10 NOTE — Progress Notes (Signed)
Fasting CBG and A1C done today.

## 2014-08-18 ENCOUNTER — Ambulatory Visit (INDEPENDENT_AMBULATORY_CARE_PROVIDER_SITE_OTHER): Payer: BLUE CROSS/BLUE SHIELD | Admitting: Sports Medicine

## 2014-08-18 ENCOUNTER — Encounter: Payer: Self-pay | Admitting: Sports Medicine

## 2014-08-18 VITALS — BP 126/78

## 2014-08-18 DIAGNOSIS — M174 Other bilateral secondary osteoarthritis of knee: Secondary | ICD-10-CM | POA: Diagnosis not present

## 2014-08-18 MED ORDER — METHYLPREDNISOLONE ACETATE 40 MG/ML IJ SUSP: 40.0000 mg | Freq: Once | INTRAMUSCULAR | Status: DC

## 2014-08-18 NOTE — Progress Notes (Signed)
   Subjective:    Patient ID: Thomas [name], male    DOB: 12-Mar-1951, 64 y.o.   MRN: [id number]  HPI Thomas [name] is a 63 year old male who presents with concern over bilateral knee pain. Onset was several years ago without any known acute injury. 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 He has a known prior history of bilateral knee osteoarthritis for which his previous provider had been discussing knee replacement surgery. Symptoms are less visit he tried a prednisone Dosepak which provided approximately 8 days of relief. He continues to take chronic anti-inflammatories and Norco intermittently. Location of knee pain is primarily anterior bilateral knees, right greater than left. He notes occasional swelling and sensation of giving way. He denies any locking or catching. Symptoms are aggravated with sitting for prolonged periods of time and relieved with rest and elevation. He is hopeful to avoid knee replacement surgery. He has never had injections or therapy for his knee pain. He is interested in trying corticosteroid injections today for both knees.  Past medical history, social history, medications, and allergies were reviewed and are up to date in the chart.  Review of Systems 7 point review of systems was performed and was otherwise negative unless noted in the history of present illness.     Objective:   Physical Exam GEN: The patient is well-developed well-nourished male and in no acute distress. He is awake alert and oriented x3. SKIN: warm and well-perfused, no rash  EXTR: No lower extremity edema or calf tenderness Neuro: Strength 5/5 globally. Sensation intact throughout. No focal deficits. Vasc: +2 bilateral distal pulses. No edema.  MSK: Examination of the bilateral knees reveals no effusion, warmth, erythema, or rash. Range of motion of the right knee is from 5-120 with pain and  crepitus. Range of motion of the left knee is from 5-120 with pain and crepitus. He has a slight varus deformity bilaterally. No instability with valgus or varus stress testing. Positive patellar grind test bilaterally. Medial joint line tenderness to palpation bilaterally. Fairly good quadriceps strength. No atrophy.  X-rays bilateral knees 07/29/14: Bilateral severe tricompartmental DJD, worse medial compartment.  Procedure:  Risks, benefits and complications were reviewed with the patient. After obtaining informed verbal consent the right knee was sterilely prepped with alcohol.  Ethyl chloride was used to anesthetize the skin and intra-articular injection using a 25-gauge by 1-1/2 inch needle was accomplished without difficulty.  We injected methylprednisolone 40mg  with 4 cc 1% lidocaine without epinephrine.  Patient tolerated the procedure well and was observed for 5-10 min. Post injection instructions, including signs and symptoms of complications were discussed.  Procedure:  Risks, benefits and complications were reviewed with the patient. After obtaining informed verbal consent the left knee was sterilely prepped with alcohol.  Ethyl chloride was used to anesthetize the skin and intra-articular injection using a 25-gauge by 1-1/2 inch needle was accomplished without difficulty.  We injected methylprednisolone 40mg  with 4 cc 1% lidocaine without epinephrine.  Patient tolerated the procedure well and was observed for 5-10 min. Post injection instructions, including signs and symptoms of complications were discussed.     Assessment & Plan:  Please see problem based assessment and plan in the problem list.

## 2014-08-18 NOTE — Assessment & Plan Note (Addendum)
Severe tricompartmental OA. X-rays 07/29/14. -Tolerated bilateral CST inj today -Discussed viscosupplementation if <62mo relief, which he will call clinic if interested first -Discussed likely need for knee replacement surgery, but he hopes to avoid this for now -Discussed maintaining activity, function strategies -Plan follow-up in 3 mo or sooner prn

## 2014-09-12 ENCOUNTER — Other Ambulatory Visit: Payer: Self-pay | Admitting: Podiatry

## 2014-09-15 ENCOUNTER — Encounter: Payer: Self-pay | Admitting: Internal Medicine

## 2014-09-16 ENCOUNTER — Other Ambulatory Visit: Payer: Self-pay | Admitting: Podiatry

## 2014-09-17 NOTE — Telephone Encounter (Signed)
Pt request refill of Augmentin previously prescribed 06/30/2014.  Pt states he is out-of-town and will not be here for appts until after mid-June, but will come in for appt to discuss possible surgical options.  Dr. Milinda Pointer ordered refill the Augmentin as previously and 1 refill.  Pt is informed to go to ER if conditions worsen, and states understanding.

## 2014-09-28 ENCOUNTER — Telehealth: Payer: Self-pay | Admitting: Family Medicine

## 2014-09-28 MED ORDER — HYDROCODONE-ACETAMINOPHEN 5-325 MG PO TABS: 1.0000 | ORAL_TABLET | Freq: Two times a day (BID) | ORAL | Status: DC | PRN

## 2014-09-28 NOTE — Telephone Encounter (Signed)
Pt called back and would like to speak to Dr. Erin Hearing before any prescriptions are filled. jw

## 2014-09-28 NOTE — Telephone Encounter (Signed)
Working out of town.  Will leave printed Rx up front to pick up  Discussed going up on dosage but told him that would not be good care  He will see me before more refills needed end of July or August

## 2014-09-28 NOTE — Telephone Encounter (Signed)
Tried to make an appt for patient in July but that schedule is not out yet.  Will forward to MD for medication refill and will also see if he knows which days he will be in clinic. Tauna Macfarlane,CMA

## 2014-09-28 NOTE — Telephone Encounter (Signed)
Is working out of town in [place]. Will only be in town June 20.  There is no schedule for [facility] that day He needs his meds refilled Please advise

## 2014-10-09 ENCOUNTER — Other Ambulatory Visit: Payer: Self-pay | Admitting: Family Medicine

## 2014-11-04 ENCOUNTER — Ambulatory Visit (INDEPENDENT_AMBULATORY_CARE_PROVIDER_SITE_OTHER): Payer: BLUE CROSS/BLUE SHIELD | Admitting: Family Medicine

## 2014-11-04 ENCOUNTER — Encounter: Payer: Self-pay | Admitting: Family Medicine

## 2014-11-04 VITALS — BP 136/85 | HR 80 | Temp 98.0°F | Ht 79.0 in | Wt 254.0 lb

## 2014-11-04 DIAGNOSIS — I10 Essential (primary) hypertension: Secondary | ICD-10-CM

## 2014-11-04 DIAGNOSIS — M545 Low back pain, unspecified: Secondary | ICD-10-CM

## 2014-11-04 MED ORDER — HYDROCODONE-ACETAMINOPHEN 5-325 MG PO TABS: 1.0000 | ORAL_TABLET | Freq: Two times a day (BID) | ORAL | Status: DC | PRN

## 2014-11-04 NOTE — Patient Instructions (Signed)
Good to see you today!  Thanks for coming in.  Call your insurance about copay for colonoscopy and about shingles shot  Come back before your hydrocodone needs to be refilled

## 2014-11-04 NOTE — Assessment & Plan Note (Signed)
Stable.  Continue prn use of multiple analgesics to enable him to work.  No signs of inappropriate use

## 2014-11-04 NOTE — Progress Notes (Signed)
   Subjective:    Patient ID: Thomas [name], male    DOB: 1951/02/12, 64 y.o.   MRN: [id number]  HPI  HYPERTENSION Disease Monitoring Home BP -usually at goal Chest pain- no    Dyspnea- no Medications Compliance-  Daily 40 mg lisinopril. Lightheadedness-  no  Edema- no ROS - See HPI  Chronic Arthritis Pain Taking hydrocodone regularly about 2 per day.  Some days takes less if not having worked hard.  Using tylenol and ibuprofen as needed  Also now using flexaril some days. Not problems with medications which enable him to work at his [employer] job  Elevated PSA - following with urrology  Chief Complaint noted Review of Symptoms - see HPI PMH - Smoking status noted.   Vital Signs reviewed  Review of Systems     Objective:   Physical Exam  Alert nad     Assessment & Plan:

## 2014-11-04 NOTE — Assessment & Plan Note (Signed)
BP Readings from Last 3 Encounters:  11/04/14 136/85  08/18/14 126/78  07/14/14 142/102   At goal.  Continue medications

## 2014-12-21 ENCOUNTER — Other Ambulatory Visit: Payer: Self-pay | Admitting: *Deleted

## 2014-12-21 MED ORDER — PREDNISONE 10 MG (21) PO TBPK: 10.0000 mg | ORAL_TABLET | Freq: Every day | ORAL | Status: DC

## 2014-12-30 ENCOUNTER — Ambulatory Visit (INDEPENDENT_AMBULATORY_CARE_PROVIDER_SITE_OTHER): Payer: BLUE CROSS/BLUE SHIELD | Admitting: Family Medicine

## 2014-12-30 ENCOUNTER — Encounter: Payer: Self-pay | Admitting: Family Medicine

## 2014-12-30 VITALS — BP 116/69 | HR 88 | Ht 79.0 in | Wt 255.0 lb

## 2014-12-30 DIAGNOSIS — M7711 Lateral epicondylitis, right elbow: Secondary | ICD-10-CM | POA: Diagnosis not present

## 2014-12-30 NOTE — Assessment & Plan Note (Signed)
Patient with history that is descriptive of lateral epicondylitis. -He is feeling better at this point after the steroid burst that was prescribed to him. -Recommend continued activity modification, counterforce strap, in the future may need physical therapy/eccentric exercises. As well would consider short course of Mobic with ice. -If it becomes a nuisance after 6-8 weeks of conservative therapy would consider injection into the extensor tendon wad.

## 2014-12-30 NOTE — Progress Notes (Signed)
   Subjective:    Patient ID: BETHANY CUMMING, male    DOB: 12-24-1950, 64 y.o.   MRN: [id number]  HPI Mr Muse is a 64yo right-hand dominant male who presents with right posterior elbow pain. Onset of symptoms was several years ago, but was worse over the past 1-2 weeks. 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 Location of symptoms is lateral epicondylar region. Character of pain is intermittent, sharp, stabbing. He denies any associated elbow swelling, locking, popping, fevers, chills, or rash. He tried NSAIDs with little relief. A Medrol DosePak, which has improved his symptoms 90%. Today, he states his pain is almost fully resolved. Pain is worse with supination or wrist extension.  An x-ray was obtained on 12/02/13 with degenerative changes and a posterior medial olecranon osteophyte formation was seen.  Review of Systems As per HPI, and 11 point ROS was obtained and was otherwise negative.    Objective:   Physical Exam BP 116/69 mmHg  Pulse 88  Ht 6\' 7"  (2.007 m)  Wt 255 lb (115.667 kg)  BMI 28.72 kg/m2 GEN: The patient is well-developed well-nourished male and in no acute distress.  He is awake alert and oriented x3. SKIN: warm and well-perfused, no rash  Neuro: Strength 5/5 globally. Sensation intact throughout. DTRs 2/4 bilaterally at biceps, triceps, and brachioradialis. No focal deficits. Vasc: +2 bilateral distal pulses. No edema.  MSK: Examination of the elbows reveals pain free ROM with lack of 10 degrees of extension bilaterally. No popping or catching is palpable in the right elbow. There is minimal right elbow swelling. TTP at the lateral upper condyle is minimal. No valgus or vagus laxity. No tenderness at the triceps insertion. Negative tinel's at the elbow. Neurovascularly intact right upper extremity

## 2015-02-10 ENCOUNTER — Encounter: Payer: Self-pay | Admitting: Family Medicine

## 2015-02-10 ENCOUNTER — Ambulatory Visit (INDEPENDENT_AMBULATORY_CARE_PROVIDER_SITE_OTHER): Payer: BLUE CROSS/BLUE SHIELD | Admitting: Family Medicine

## 2015-02-10 VITALS — BP 164/94 | HR 78 | Temp 98.0°F | Wt 245.0 lb

## 2015-02-10 DIAGNOSIS — R195 Other fecal abnormalities: Secondary | ICD-10-CM | POA: Diagnosis not present

## 2015-02-10 DIAGNOSIS — M153 Secondary multiple arthritis: Secondary | ICD-10-CM | POA: Diagnosis not present

## 2015-02-10 DIAGNOSIS — I10 Essential (primary) hypertension: Secondary | ICD-10-CM | POA: Diagnosis not present

## 2015-02-10 MED ORDER — HYDROCODONE-ACETAMINOPHEN 5-325 MG PO TABS: 1.0000 | ORAL_TABLET | Freq: Two times a day (BID) | ORAL | Status: DC | PRN

## 2015-02-10 MED ORDER — ZOSTER VACCINE LIVE 19400 UNT/0.65ML ~~LOC~~ SOLR: 0.6500 mL | Freq: Once | SUBCUTANEOUS | Status: DC

## 2015-02-10 NOTE — Progress Notes (Signed)
   Subjective:    Patient ID: Thomas [name], male    DOB: 06-03-1950, 64 y.o.   MRN: [id number]  HPI   HYPERTENSION  Disease Monitoring Home BP Monitoring (Severity) not checking rarely home Symptoms - Chest pain- no    Dyspnea- no Medications(Modifying factors) Compliance-  Every day. Lightheadedness-  no  Edema- no Timing - continuous  Duration - years ROS - See HPI  PMH Lab Review   POTASSIUM  Date Value Ref Range Status  06/17/2014 4.5 3.5 - 5.3 mEq/L Final   SODIUM  Date Value Ref Range Status  06/17/2014 138 135 - 145 mEq/L Final   CREAT  Date Value Ref Range Status  06/17/2014 1.00 0.50 - 1.35 mg/dL Final   CREATININE, SER  Date Value Ref Range Status  03/13/2007 1.04  Final           Chronic Arthritis Pain Taking hydrocodone regularly about 2 per day.  Using tylenol and ibuprofen as needed  Also now using flexaril some days.He does not feel this regimen is controlling his pain and is investigating surgery on his knees.  He does not feel that the narcotic make him at increased risk for accidents for and enable him to work at his [employer] job.  No fevers or rashes or joint swelling  Heme Postivie Stool No lightheadness or dizzyness or dark stools Has not follow up with GI.  Understands he needs to but has been too busy at work  Elevated PSA - following with urology  Chief Complaint noted Review of Symptoms - see HPI PMH - Smoking status noted.   Vital Signs reviewed  Review of Systems      Objective:   Physical Exam Alert nad Able to walk around room without symptoms      Assessment & Plan:

## 2015-02-10 NOTE — Patient Instructions (Addendum)
Good to see you today!  Thanks for coming in.  Your knees are telling to do something more definitive than just taking pain medications- investigate surgery ASAP.  Pain medications can effect your ability to work and to avoid accidents  You need a colonoscopy to make sure your don't have colon cancer  Try to check your blood pressure every so often.  It should be less than 140/90  Come back in 4 months before you need more refill

## 2015-02-11 NOTE — Assessment & Plan Note (Signed)
Worsened with increased pain.  We discussed increased narcotics I feel this would not be good medical care and that his increased pain is an indication for him to seek more definitive therapy and to consider reducing the physical stress from his job.   He understands and is reviewing options for evaluation

## 2015-02-11 NOTE — Assessment & Plan Note (Signed)
BP Readings from Last 3 Encounters:  02/10/15 164/94  12/30/14 116/69  11/04/14 136/85   Worsened Not at goal today.  Will monitor at home.  It may be due to his worsening pain with his DJD

## 2015-02-11 NOTE — Assessment & Plan Note (Signed)
Unchanged.  He has no symptoms of anemia but has not followed up with GI for evaluation. See AVS

## 2015-02-19 ENCOUNTER — Telehealth: Payer: Self-pay | Admitting: Family Medicine

## 2015-02-19 NOTE — Telephone Encounter (Signed)
Pt is calling because his insurance will not cover the shingle vaccine since it is not in a doctor setting. He is wondering if the doctor would order blood work to see if he had chicken pox as a child. jw

## 2015-02-19 NOTE — Telephone Encounter (Signed)
Will forward to MD. Jazmin Hartsell,CMA  

## 2015-02-22 NOTE — Telephone Encounter (Signed)
Plese call and tell him:  Given the likelihood that he had chicken pox if very high even if teh reading came back low its still very likely that he could get shingles so the blood test would not do any good  We can try again later to get the shingles since often insurances change

## 2015-02-23 NOTE — Telephone Encounter (Signed)
Advised pt as directed below and verbalized understanding but stated that his insurance is with [facility] and has been for the last few years and that next year the insurance rate is going up 25% and will not be able to afford this. He stated that he was trying to get everything done now while he was still under this insurance but [employer] will not cover at retail price as [facility] price is $300 for shingles vaccine. [employer] only will cover at doctor office. I asked him if they suggested other locations to have the vaccine covered at besides in doctor's office and they did not give him any options. He wanted to know if there was a reason why we do not carry the vaccine and I was not exactly sure why we do not carry it. He said thank you anyway. Thomas [name], CMA.

## 2015-03-03 ENCOUNTER — Telehealth: Payer: Self-pay | Admitting: Family Medicine

## 2015-03-03 MED ORDER — PREDNISONE 50 MG PO TABS: 50.0000 mg | ORAL_TABLET | Freq: Every day | ORAL | Status: DC

## 2015-03-03 NOTE — Telephone Encounter (Signed)
Sent in short corse of prednisone  He will recheck the shingle shot payments

## 2015-03-03 NOTE — Telephone Encounter (Signed)
Refill request from pt. Will forward to PCP for review. Eliany Mccarter, CMA. 

## 2015-03-03 NOTE — Telephone Encounter (Signed)
Pt is calling and would like a refill on his Prednisone called in. jw

## 2015-03-24 ENCOUNTER — Other Ambulatory Visit: Payer: Self-pay | Admitting: Family Medicine

## 2015-04-08 ENCOUNTER — Telehealth: Payer: Self-pay | Admitting: Family Medicine

## 2015-04-08 DIAGNOSIS — M153 Secondary multiple arthritis: Secondary | ICD-10-CM

## 2015-04-08 NOTE — Telephone Encounter (Signed)
Pt called and he needs a referral to go to [facility] and he wants to see Dr. Wynelle Link. jw

## 2015-04-08 NOTE — Telephone Encounter (Signed)
Will forward to MD. Jazmin Hartsell,CMA  

## 2015-04-12 ENCOUNTER — Other Ambulatory Visit: Payer: Self-pay | Admitting: Family Medicine

## 2015-04-22 ENCOUNTER — Other Ambulatory Visit: Payer: Self-pay | Admitting: Family Medicine

## 2015-04-22 NOTE — Telephone Encounter (Signed)
Not safe to take that amount of prednisone more than once every 3 months.  His last dose was in November.  If he is having an acute pain we could see him tomorrow  Thanks  G[place]man

## 2015-04-22 NOTE — Telephone Encounter (Signed)
Pt called and would like a refill on his Prednisone. He has an appointment for 05/26/15 with the ortho doctor. jw

## 2015-04-22 NOTE — Telephone Encounter (Signed)
Spoke with patient and he voiced understanding.  Declined an appt for tomorrow but will call if pain worsens.  He described it as the "same dull ache", but that the steroid tends to help.  He will just wait until his ortho appt.  Gianluca Chhim,CMA

## 2015-04-25 HISTORY — PX: JOINT REPLACEMENT: SHX530

## 2015-06-23 ENCOUNTER — Other Ambulatory Visit: Payer: Self-pay | Admitting: Family Medicine

## 2015-06-23 NOTE — Telephone Encounter (Signed)
i will write when i return  If he needs before pls askdr Mcd   pls check to see when his rx runs out since last refill Thanks

## 2015-06-23 NOTE — Telephone Encounter (Signed)
Spoke with patient and he states that he will be out of medication on 07-07-15 and plans to refill again on 07-08-15.  He just wanted to make sure he didn't call too late. Quill Grinder,CMA

## 2015-06-23 NOTE — Telephone Encounter (Signed)
Mr. Thomas [name] called to make an appt for refills but your next available clinic is on 3/22.  I scheduled him, but he need to get his hydrocodone refilled.

## 2015-07-05 MED ORDER — HYDROCODONE-ACETAMINOPHEN 5-325 MG PO TABS: 1.0000 | ORAL_TABLET | Freq: Two times a day (BID) | ORAL | Status: DC | PRN

## 2015-07-05 NOTE — Telephone Encounter (Signed)
Pt called to check the status of his refill request of his hydrocodone.jw

## 2015-07-05 NOTE — Telephone Encounter (Signed)
Rx printed given to [employer]

## 2015-07-05 NOTE — Telephone Encounter (Signed)
Patient is aware that script is ready for pick up. Dailyn Kempner,CMA  

## 2015-07-14 ENCOUNTER — Ambulatory Visit (INDEPENDENT_AMBULATORY_CARE_PROVIDER_SITE_OTHER): Payer: BLUE CROSS/BLUE SHIELD | Admitting: Family Medicine

## 2015-07-14 ENCOUNTER — Encounter: Payer: Self-pay | Admitting: Family Medicine

## 2015-07-14 ENCOUNTER — Ambulatory Visit: Payer: BLUE CROSS/BLUE SHIELD | Admitting: Family Medicine

## 2015-07-14 VITALS — BP 150/90 | HR 80 | Temp 97.9°F | Ht >= 80 in | Wt 244.0 lb

## 2015-07-14 DIAGNOSIS — Z114 Encounter for screening for human immunodeficiency virus [HIV]: Secondary | ICD-10-CM

## 2015-07-14 DIAGNOSIS — Z23 Encounter for immunization: Secondary | ICD-10-CM | POA: Diagnosis not present

## 2015-07-14 DIAGNOSIS — I10 Essential (primary) hypertension: Secondary | ICD-10-CM

## 2015-07-14 DIAGNOSIS — M153 Secondary multiple arthritis: Secondary | ICD-10-CM

## 2015-07-14 DIAGNOSIS — Z1159 Encounter for screening for other viral diseases: Secondary | ICD-10-CM

## 2015-07-14 LAB — COMPREHENSIVE METABOLIC PANEL
ALT: 40 U/L (ref 9–46)
AST: 31 U/L (ref 10–35)
Albumin: 4.6 g/dL (ref 3.6–5.1)
Alkaline Phosphatase: 91 U/L (ref 40–115)
BUN: 23 mg/dL (ref 7–25)
CO2: 28 mmol/L (ref 20–31)
Calcium: 9.5 mg/dL (ref 8.6–10.3)
Chloride: 100 mmol/L (ref 98–110)
Creat: 0.86 mg/dL (ref 0.70–1.25)
Glucose, Bld: 100 mg/dL — ABNORMAL HIGH (ref 65–99)
Potassium: 4.4 mmol/L (ref 3.5–5.3)
Sodium: 137 mmol/L (ref 135–146)
Total Bilirubin: 0.8 mg/dL (ref 0.2–1.2)
Total Protein: 7.4 g/dL (ref 6.1–8.1)

## 2015-07-14 LAB — CBC
HCT: 47.7 % (ref 39.0–52.0)
Hemoglobin: 16.6 g/dL (ref 13.0–17.0)
MCH: 30.5 pg (ref 26.0–34.0)
MCHC: 34.8 g/dL (ref 30.0–36.0)
MCV: 87.7 fL (ref 78.0–100.0)
MPV: 10.6 fL (ref 8.6–12.4)
Platelets: 247 10*3/uL (ref 150–400)
RBC: 5.44 MIL/uL (ref 4.22–5.81)
RDW: 13.9 % (ref 11.5–15.5)
WBC: 5 10*3/uL (ref 4.0–10.5)

## 2015-07-14 MED ORDER — CHLORTHALIDONE 25 MG PO TABS: 25.0000 mg | ORAL_TABLET | Freq: Every day | ORAL | Status: DC

## 2015-07-14 MED ORDER — HYDROCODONE-ACETAMINOPHEN 5-325 MG PO TABS: 1.0000 | ORAL_TABLET | Freq: Two times a day (BID) | ORAL | Status: DC | PRN

## 2015-07-14 NOTE — Patient Instructions (Signed)
Good to see you today!  Thanks for coming in.  For the Knee pain - I have given you two prescriptions to last until you have surgery.  Then Dr Alvan Dame should write for your pain medications  For the Blood pressure - add Chlorthalidone one pill every morning.   Make sure you drink plenty of fluids Check your blood pressure as you can the goal is < 140/90  You still need to have a colonoscopy to make sure you do not have colon cancer  Check with your insurance about the shingle shot  I will call you if your tests are not good.  Otherwise I will send you a letter.  If you do not hear from me with in 2 weeks please call our office.     Good luck with the surgery

## 2015-07-14 NOTE — Progress Notes (Signed)
   Subjective:    Patient ID: Thomas [name], male    DOB: 10-Dec-1950, 65 y.o.   MRN: [id number]  HPI  HYPERTENSION  Disease Monitoring Home BP Monitoring (Severity) not checking rarely home Symptoms - Chest pain- no    Dyspnea- no Medications(Modifying factors) Compliance-  Every day. Lightheadedness-  no  Edema- no Timing - continuous  Duration - years   Chronic Arthritis Pain Taking hydrocodone regularly about 2 per day.  Using tylenol as needed Switched from ibuprofen to other unknown NSAID by Dr Alvan Dame ortho.  Also now using flexaril some days He is scheduled for bilat knee surgery in June.  No fevers or rashes or joint swelling  Heme Postivie Stool No lightheadness or dizzyness or dark stools.  No change in bowel movements Has not follow up with GI.  Understands he needs to but has been too busy at work  Elevated PSA - following with urology  Chief Complaint noted Review of Symptoms - see HPI PMH - Smoking status noted.   Vital Signs reviewed  Review of Systems     Objective:   Physical Exam Alert nad     Assessment & Plan:

## 2015-07-14 NOTE — Assessment & Plan Note (Signed)
Not controlled Is proceeding toward surgery.  Continue analgesics until then

## 2015-07-14 NOTE — Assessment & Plan Note (Addendum)
BP Readings from Last 3 Encounters:  07/14/15 150/90  02/10/15 164/94  12/30/14 116/69   Not at goal Will start Chlorthalidone and monitor blood pressure

## 2015-07-15 ENCOUNTER — Encounter: Payer: Self-pay | Admitting: Family Medicine

## 2015-07-15 LAB — HEPATITIS C ANTIBODY: HCV Ab: NEGATIVE

## 2015-07-15 LAB — HIV ANTIBODY (ROUTINE TESTING W REFLEX): HIV 1&2 Ab, 4th Generation: NONREACTIVE

## 2015-08-23 ENCOUNTER — Other Ambulatory Visit: Payer: Self-pay | Admitting: Family Medicine

## 2015-09-02 ENCOUNTER — Encounter (HOSPITAL_COMMUNITY): Payer: Self-pay

## 2015-09-02 ENCOUNTER — Encounter (HOSPITAL_COMMUNITY)
Admission: RE | Admit: 2015-09-02 | Discharge: 2015-09-02 | Disposition: A | Discharge status: Home / Self Care | Payer: BLUE CROSS/BLUE SHIELD | Source: Ambulatory Visit | Attending: Orthopedic Surgery | Admitting: Orthopedic Surgery

## 2015-09-02 DIAGNOSIS — I10 Essential (primary) hypertension: Secondary | ICD-10-CM | POA: Diagnosis not present

## 2015-09-02 DIAGNOSIS — Z01818 Encounter for other preprocedural examination: Secondary | ICD-10-CM | POA: Diagnosis present

## 2015-09-02 DIAGNOSIS — M170 Bilateral primary osteoarthritis of knee: Secondary | ICD-10-CM | POA: Insufficient documentation

## 2015-09-02 DIAGNOSIS — Z0183 Encounter for blood typing: Secondary | ICD-10-CM | POA: Diagnosis not present

## 2015-09-02 DIAGNOSIS — Z01812 Encounter for preprocedural laboratory examination: Secondary | ICD-10-CM | POA: Diagnosis not present

## 2015-09-02 LAB — PROTIME-INR
INR: 0.99 (ref 0.00–1.49)
Prothrombin Time: 13.3 seconds (ref 11.6–15.2)

## 2015-09-02 LAB — CBC
HCT: 47.7 % (ref 39.0–52.0)
Hemoglobin: 16 g/dL (ref 13.0–17.0)
MCH: 29.1 pg (ref 26.0–34.0)
MCHC: 33.5 g/dL (ref 30.0–36.0)
MCV: 86.9 fL (ref 78.0–100.0)
Platelets: 221 10*3/uL (ref 150–400)
RBC: 5.49 MIL/uL (ref 4.22–5.81)
RDW: 13 % (ref 11.5–15.5)
WBC: 4.9 10*3/uL (ref 4.0–10.5)

## 2015-09-02 LAB — BASIC METABOLIC PANEL
Anion gap: 9 (ref 5–15)
BUN: 26 mg/dL — ABNORMAL HIGH (ref 6–20)
CO2: 25 mmol/L (ref 22–32)
Calcium: 9.3 mg/dL (ref 8.9–10.3)
Chloride: 108 mmol/L (ref 101–111)
Creatinine, Ser: 0.8 mg/dL (ref 0.61–1.24)
GFR calc Af Amer: >60 mL/min (ref >60)
GFR calc non Af Amer: >60 mL/min (ref >60)
Glucose, Bld: 90 mg/dL (ref 65–99)
Potassium: 4.1 mmol/L (ref 3.5–5.1)
Sodium: 142 mmol/L (ref 135–145)

## 2015-09-02 LAB — TYPE AND SCREEN
ABO/RH(D): O NEG
Antibody Screen: NEGATIVE

## 2015-09-02 LAB — SURGICAL PCR SCREEN
MRSA, PCR: NEGATIVE
Staphylococcus aureus: NEGATIVE

## 2015-09-02 NOTE — Progress Notes (Signed)
Clearance note per chart per Dr Erin Hearing

## 2015-09-02 NOTE — Progress Notes (Signed)
BMP results in epic per PAT visit 09/02/2015 sent to Dr Alvan Dame

## 2015-09-02 NOTE — Patient Instructions (Signed)
Thomas [name]  09/02/2015   Your procedure is scheduled on: Monday Sep 13, 2015  Report to [facility] Main  Entrance take [facility]  elevators to 3rd floor to  [place] at 7:30 AM.  Call this number if you have problems the morning of surgery [phone]   Remember: ONLY 1 PERSON MAY GO WITH YOU TO SHORT STAY TO GET  READY MORNING OF [place].  Do not eat food or drink liquids :After Midnight.     Take these medicines the morning of surgery with A SIP OF WATER: Hydrocodone-Acetaminophen if needed                                You may not have any metal on your body including hair pins and              piercings  Do not wear jewelry, lotions, powders or colognes, deodorant             Men may shave face and neck.   Do not bring valuables to the hospital. [place].  Contacts, dentures or bridgework may not be worn into surgery.  Leave suitcase in the car. After surgery it may be brought to your room.                Please read over the following fact sheets you were given:MRSA INFORMATION SHEET; INCENTIVE SPIROMETER; BLOOD TRANSFUSION INFORMATION SHEET  _____________________________________________________________________             [facility] - Preparing for Surgery Before surgery, you can play an important role.  Because skin is not sterile, your skin needs to be as free of germs as possible.  You can reduce the number of germs on your skin by washing with CHG (chlorahexidine gluconate) soap before surgery.  CHG is an antiseptic cleaner which kills germs and bonds with the skin to continue killing germs even after washing. Please DO NOT use if you have an allergy to CHG or antibacterial soaps.  If your skin becomes reddened/irritated stop using the CHG and inform your nurse when you arrive at Short Stay. Do not shave (including legs and underarms) for at least 48 hours prior to the first CHG  shower.  You may shave your face/neck. Please follow these instructions carefully:  1.  Shower with CHG Soap the night before surgery and the  morning of Surgery.  2.  If you choose to wash your hair, wash your hair first as usual with your  normal  shampoo.  3.  After you shampoo, rinse your hair and body thoroughly to remove the  shampoo.                           4.  Use CHG as you would any other liquid soap.  You can apply chg directly  to the skin and wash                       Gently with a scrungie or clean washcloth.  5.  Apply the CHG Soap to your body ONLY FROM THE NECK DOWN.   Do not use on face/ open  Wound or open sores. Avoid contact with eyes, ears mouth and genitals (private parts).                       Wash face,  Genitals (private parts) with your normal soap.             6.  Wash thoroughly, paying special attention to the area where your surgery  will be performed.  7.  Thoroughly rinse your body with warm water from the neck down.  8.  DO NOT shower/wash with your normal soap after using and rinsing off  the CHG Soap.                9.  Pat yourself dry with a clean towel.            10.  Wear clean pajamas.            11.  Place clean sheets on your bed the night of your first shower and do not  sleep with pets. Day of Surgery : Do not apply any lotions/deodorants the morning of surgery.  Please wear clean clothes to the hospital/surgery center.  FAILURE TO FOLLOW THESE INSTRUCTIONS MAY RESULT IN THE CANCELLATION OF YOUR SURGERY PATIENT SIGNATURE_________________________________  NURSE SIGNATURE__________________________________  ________________________________________________________________________   Thomas [name]  An incentive spirometer is a tool that can help keep your lungs clear and active. This tool measures how well you are filling your lungs with each breath. Taking long deep breaths may help reverse or decrease the chance  of developing breathing (pulmonary) problems (especially infection) following:  A long period of time when you are unable to move or be active. BEFORE THE PROCEDURE   If the spirometer includes an indicator to show your best effort, your nurse or respiratory therapist will set it to a desired goal.  If possible, sit up straight or lean slightly forward. Try not to slouch.  Hold the incentive spirometer in an upright position. INSTRUCTIONS FOR USE   Sit on the edge of your bed if possible, or sit up as far as you can in bed or on a chair.  Hold the incentive spirometer in an upright position.  Breathe out normally.  Place the mouthpiece in your mouth and seal your lips tightly around it.  Breathe in slowly and as deeply as possible, raising the piston or the ball toward the top of the column.  Hold your breath for 3-5 seconds or for as long as possible. Allow the piston or ball to fall to the bottom of the column.  Remove the mouthpiece from your mouth and breathe out normally.  Rest for a few seconds and repeat Steps 1 through 7 at least 10 times every 1-2 hours when you are awake. Take your time and take a few normal breaths between deep breaths.  The spirometer may include an indicator to show your best effort. Use the indicator as a goal to work toward during each repetition.  After each set of 10 deep breaths, practice coughing to be sure your lungs are clear. If you have an incision (the cut made at the time of surgery), support your incision when coughing by placing a pillow or rolled up towels firmly against it. Once you are able to get out of bed, walk around indoors and cough well. You may stop using the incentive spirometer when instructed by your caregiver.  RISKS AND COMPLICATIONS  Take your time so you do not get  dizzy or light-headed.  If you are in pain, you may need to take or ask for pain medication before doing incentive spirometry. It is harder to take a deep  breath if you are having pain. AFTER USE  Rest and breathe slowly and easily.  It can be helpful to keep track of a log of your progress. Your caregiver can provide you with a simple table to help with this. If you are using the spirometer at home, follow these instructions: [place] IF:   You are having difficultly using the spirometer.  You have trouble using the spirometer as often as instructed.  Your pain medication is not giving enough relief while using the spirometer.  You develop fever of 100.5 F (38.1 C) or higher. SEEK IMMEDIATE MEDICAL CARE IF:   You cough up bloody sputum that had not been present before.  You develop fever of 102 F (38.9 C) or greater.  You develop worsening pain at or near the incision site. MAKE SURE YOU:   Understand these instructions.  Will watch your condition.  Will get help right away if you are not doing well or get worse. Document Released: 08/21/2006 Document Revised: 07/03/2011 Document Reviewed: 10/22/2006 ExitCare Patient Information 2014 ExitCare, [place].   ________________________________________________________________________  WHAT IS A BLOOD TRANSFUSION? Blood Transfusion Information  A transfusion is the replacement of blood or some of its parts. Blood is made up of multiple cells which provide different functions.  Red blood cells carry oxygen and are used for blood loss replacement.  White blood cells fight against infection.  Platelets control bleeding.  Plasma helps clot blood.  Other blood products are available for specialized needs, such as hemophilia or other clotting disorders. BEFORE THE TRANSFUSION  Who gives blood for transfusions?   Healthy volunteers who are fully evaluated to make sure their blood is safe. This is blood bank blood. Transfusion therapy is the safest it has ever been in the practice of medicine. Before blood is taken from a donor, a complete history is taken to make sure  that person has no history of diseases nor engages in risky social behavior (examples are intravenous drug use or sexual activity with multiple partners). The donor's travel history is screened to minimize risk of transmitting infections, such as malaria. The donated blood is tested for signs of infectious diseases, such as HIV and hepatitis. The blood is then tested to be sure it is compatible with you in order to minimize the chance of a transfusion reaction. If you or a relative donates blood, this is often done in anticipation of surgery and is not appropriate for emergency situations. It takes many days to process the donated blood. RISKS AND COMPLICATIONS Although transfusion therapy is very safe and saves many lives, the main dangers of transfusion include:   Getting an infectious disease.  Developing a transfusion reaction. This is an allergic reaction to something in the blood you were given. Every precaution is taken to prevent this. The decision to have a blood transfusion has been considered carefully by your caregiver before blood is given. Blood is not given unless the benefits outweigh the risks. AFTER THE TRANSFUSION  Right after receiving a blood transfusion, you will usually feel much better and more energetic. This is especially true if your red blood cells have gotten low (anemic). The transfusion raises the level of the red blood cells which carry oxygen, and this usually causes an energy increase.  The nurse administering the transfusion will  monitor you carefully for complications. HOME CARE INSTRUCTIONS  No special instructions are needed after a transfusion. You may find your energy is better. Speak with your caregiver about any limitations on activity for underlying diseases you may have. SEEK MEDICAL CARE IF:   Your condition is not improving after your transfusion.  You develop redness or irritation at the intravenous (IV) site. SEEK IMMEDIATE MEDICAL CARE IF:  Any of  the following symptoms occur over the next 12 hours:  Shaking chills.  You have a temperature by mouth above 102 F (38.9 C), not controlled by medicine.  Chest, back, or muscle pain.  People around you feel you are not acting correctly or are confused.  Shortness of breath or difficulty breathing.  Dizziness and fainting.  You get a rash or develop hives.  You have a decrease in urine output.  Your urine turns a dark color or changes to pink, red, or brown. Any of the following symptoms occur over the next 10 days:  You have a temperature by mouth above 102 F (38.9 C), not controlled by medicine.  Shortness of breath.  Weakness after normal activity.  The white part of the eye turns yellow (jaundice).  You have a decrease in the amount of urine or are urinating less often.  Your urine turns a dark color or changes to pink, red, or brown. Document Released: 04/07/2000 Document Revised: 07/03/2011 Document Reviewed: 11/25/2007 [facility] Patient Information 2014 [place], [place].  _______________________________________________________________________

## 2015-09-03 LAB — ABO/RH: ABO/RH(D): O NEG

## 2015-09-03 NOTE — H&P (Signed)
TOTAL KNEE ADMISSION H&P  Patient is being admitted for bilaterally total knee arthroplasty.  Subjective:  Chief Complaint:       Bilaterally knee primary OA / pain  HPI: Thomas [name], 65 y.o. male, has a history of pain and functional disability in the bilaterally knee due to arthritis and has failed non-surgical conservative treatments for greater than 12 weeks to include NSAID's and/or analgesics, corticosteriod injections and activity modification.  Onset of symptoms was gradual, starting 5-6 years ago with gradually worsening course since that time. The patient noted no past surgery on the bilaterally knee(s).  Patient currently rates pain in the bilaterally knee(s) at 8 out of 10 with activity. Patient has night pain, worsening of pain with activity and weight bearing, pain that interferes with activities of daily living, pain with passive range of motion, crepitus and joint swelling.  Patient has evidence of periarticular osteophytes and joint space narrowing by imaging studies. There is no active infection.  Risks, benefits and expectations were discussed with the patient.  Risks including but not limited to the risk of anesthesia, blood clots, nerve damage, blood vessel damage, failure of the prosthesis, infection and up to and including death.  Patient understand the risks, benefits and expectations and wishes to proceed with surgery.   PCP: [name], MD  D/C Plans:      Home with OPPT  Post-op Meds:       No Rx given  Tranexamic Acid:      To be given - IV    Decadron:      Is to be given  FYI:     Xarelto then ASA (bilateral)  Oxycodone    Patient Active Problem List   Diagnosis Date Noted  . Lateral epicondylitis of right elbow 12/30/2014  . Other bilateral secondary osteoarthritis of knee 07/14/2014  . Encounter for chronic pain management 05/06/2014  . Heme positive stool 05/21/2013  . Elevated PSA 10/10/2012  . Osteoarthritis, multiple sites 01/13/2011   . Erythrocytosis 12/15/2010  . Hypertension 12/06/2010  . ED (erectile dysfunction) 08/25/2010  . Testosterone deficiency 08/23/2010  . CLAUSTROPHOBIA 03/01/2010   Past Medical History  Diagnosis Date  . Left knee DJD     Xray 12/23/08  . Hypertension     Past Surgical History  Procedure Laterality Date  . Hernia repair      No prescriptions prior to admission   Allergies  Allergen Reactions  . Chlorthalidone Other (See Comments)    "Makes me light headed and I don't like the way it makes me feel"    Social History  Substance Use Topics  . Smoking status: Never Smoker   . Smokeless tobacco: Never Used  . Alcohol Use: No    Family History  Problem Relation Age of Onset  . Prostate cancer Father   . Prostate cancer Brother   . Colon cancer Neg Hx   . Rectal cancer Neg Hx   . Stomach cancer Neg Hx      Review of Systems  Constitutional: Negative.   HENT: Negative.   Eyes: Negative.   Respiratory: Negative.   Cardiovascular: Negative.   Gastrointestinal: Negative.   Genitourinary: Negative.   Musculoskeletal: Positive for joint pain.  Skin: Negative.   Neurological: Negative.   Endo/Heme/Allergies: Negative.   Psychiatric/Behavioral: Negative.     Objective:  Physical Exam  Constitutional: He is oriented to person, place, and time. He appears well-developed.  HENT:  Head: Normocephalic.  Eyes: Pupils are equal, round,  and reactive to light.  Neck: Neck supple. No JVD present. No tracheal deviation present. No thyromegaly present.  Cardiovascular: Normal rate, regular rhythm, normal heart sounds and intact distal pulses.   Respiratory: Effort normal and breath sounds normal. No stridor. No respiratory distress. He has no wheezes.  GI: Soft. There is no tenderness. There is no guarding.  Musculoskeletal:       Right knee: He exhibits decreased range of motion, swelling and bony tenderness. He exhibits no ecchymosis, no deformity, no laceration and no  erythema. Tenderness found.       Left knee: He exhibits decreased range of motion, swelling and bony tenderness. He exhibits no ecchymosis, no deformity, no laceration and no erythema. Tenderness found.  Lymphadenopathy:    He has no cervical adenopathy.  Neurological: He is alert and oriented to person, place, and time.  Skin: Skin is warm and dry.  Psychiatric: He has a normal mood and affect.      Labs:  Estimated body mass index is 26.80 kg/(m^2) as calculated from the following:   Height as of 07/14/15: 6\' 8"  (2.032 m).   Weight as of 07/14/15: 110.678 kg (244 lb).   Imaging Review Plain radiographs demonstrate severe degenerative joint disease of the bilaterally knee(s).  The bone quality appears to be good for age and reported activity level.  Assessment/Plan:  End stage arthritis, bilaterally knee   The patient history, physical examination, clinical judgment of the provider and imaging studies are consistent with end stage degenerative joint disease of the bilaterally knee(s) and total knee arthroplasty is deemed medically necessary. The treatment options including medical management, injection therapy arthroscopy and arthroplasty were discussed at length. The risks and benefits of total knee arthroplasty were presented and reviewed. The risks due to aseptic loosening, infection, stiffness, patella tracking problems, thromboembolic complications and other imponderables were discussed. The patient acknowledged the explanation, agreed to proceed with the plan and consent was signed. Patient is being admitted for inpatient treatment for surgery, pain control, PT, OT, prophylactic antibiotics, VTE prophylaxis, progressive ambulation and ADL's and discharge planning. The patient is planning to be discharged home.     West Pugh Caison Hearn   PA-C  09/03/2015, 9:09 AM

## 2015-09-13 ENCOUNTER — Inpatient Hospital Stay (HOSPITAL_COMMUNITY)
Admission: RE | Admit: 2015-09-13 | Discharge: 2015-09-15 | DRG: 462 | Disposition: A | Discharge status: Home Health | Payer: BLUE CROSS/BLUE SHIELD | Source: Ambulatory Visit | Attending: Orthopedic Surgery | Admitting: Orthopedic Surgery

## 2015-09-13 ENCOUNTER — Inpatient Hospital Stay (HOSPITAL_COMMUNITY): Payer: BLUE CROSS/BLUE SHIELD | Admitting: Anesthesiology

## 2015-09-13 ENCOUNTER — Encounter (HOSPITAL_COMMUNITY): Payer: Self-pay | Admitting: *Deleted

## 2015-09-13 ENCOUNTER — Encounter (HOSPITAL_COMMUNITY)
Admission: RE | Disposition: A | Discharge status: Home Health | Payer: Self-pay | Source: Ambulatory Visit | Attending: Orthopedic Surgery

## 2015-09-13 DIAGNOSIS — M170 Bilateral primary osteoarthritis of knee: Principal | ICD-10-CM | POA: Diagnosis present

## 2015-09-13 DIAGNOSIS — I10 Essential (primary) hypertension: Secondary | ICD-10-CM | POA: Diagnosis present

## 2015-09-13 DIAGNOSIS — M659 Synovitis and tenosynovitis, unspecified: Secondary | ICD-10-CM | POA: Diagnosis present

## 2015-09-13 DIAGNOSIS — Z96653 Presence of artificial knee joint, bilateral: Secondary | ICD-10-CM

## 2015-09-13 DIAGNOSIS — Z6826 Body mass index (BMI) 26.0-26.9, adult: Secondary | ICD-10-CM

## 2015-09-13 DIAGNOSIS — E663 Overweight: Secondary | ICD-10-CM | POA: Diagnosis present

## 2015-09-13 DIAGNOSIS — M25569 Pain in unspecified knee: Secondary | ICD-10-CM | POA: Diagnosis present

## 2015-09-13 HISTORY — PX: TOTAL KNEE ARTHROPLASTY: SHX125

## 2015-09-13 SURGERY — ARTHROPLASTY, KNEE, BILATERAL, TOTAL
Anesthesia: Spinal | Site: Knee | Laterality: Bilateral

## 2015-09-13 MED ORDER — KETOROLAC TROMETHAMINE 30 MG/ML IJ SOLN
INTRAMUSCULAR | Status: DC | PRN
  Administered 2015-09-13: 15 mg

## 2015-09-13 MED ORDER — METHOCARBAMOL 1000 MG/10ML IJ SOLN
500.0000 mg | Freq: Four times a day (QID) | INTRAVENOUS | Status: DC | PRN
  Administered 2015-09-13: 500 mg via INTRAVENOUS
  Filled 2015-09-13 (×2): qty 5
  Filled 2015-09-13: qty 550

## 2015-09-13 MED ORDER — DOCUSATE SODIUM 100 MG PO CAPS
100.0000 mg | ORAL_CAPSULE | Freq: Two times a day (BID) | ORAL | Status: DC
  Administered 2015-09-13 – 2015-09-14 (×2): 100 mg via ORAL
  Filled 2015-09-13 (×4): qty 1

## 2015-09-13 MED ORDER — HYDROMORPHONE HCL 1 MG/ML IJ SOLN: 0.5000 mg | INTRAMUSCULAR | Status: DC | PRN

## 2015-09-13 MED ORDER — PHENOL 1.4 % MT LIQD: 1.0000 | OROMUCOSAL | Status: DC | PRN

## 2015-09-13 MED ORDER — METHOCARBAMOL 500 MG PO TABS
500.0000 mg | ORAL_TABLET | Freq: Four times a day (QID) | ORAL | Status: DC | PRN
  Administered 2015-09-13: 500 mg via ORAL
  Filled 2015-09-13: qty 1

## 2015-09-13 MED ORDER — KETOROLAC TROMETHAMINE 30 MG/ML IJ SOLN
INTRAMUSCULAR | Status: DC | PRN
  Administered 2015-09-13: 15 mg via INTRAVENOUS

## 2015-09-13 MED ORDER — CHLORHEXIDINE GLUCONATE 4 % EX LIQD: 60.0000 mL | Freq: Once | CUTANEOUS | Status: DC

## 2015-09-13 MED ORDER — PROPOFOL 10 MG/ML IV BOLUS
INTRAVENOUS | Status: AC
  Filled 2015-09-13: qty 20

## 2015-09-13 MED ORDER — CEFAZOLIN SODIUM-DEXTROSE 2-4 GM/100ML-% IV SOLN
2.0000 g | Freq: Four times a day (QID) | INTRAVENOUS | Status: AC
  Administered 2015-09-13 (×2): 2 g via INTRAVENOUS
  Filled 2015-09-13 (×2): qty 100

## 2015-09-13 MED ORDER — BISACODYL 10 MG RE SUPP: 10.0000 mg | Freq: Every day | RECTAL | Status: DC | PRN

## 2015-09-13 MED ORDER — BUPIVACAINE HCL (PF) 0.5 % IJ SOLN
INTRAMUSCULAR | Status: AC
  Filled 2015-09-13: qty 30

## 2015-09-13 MED ORDER — DEXAMETHASONE SODIUM PHOSPHATE 10 MG/ML IJ SOLN: 10.0000 mg | Freq: Once | INTRAMUSCULAR | Status: DC

## 2015-09-13 MED ORDER — MENTHOL 3 MG MT LOZG: 1.0000 | LOZENGE | OROMUCOSAL | Status: DC | PRN

## 2015-09-13 MED ORDER — CELECOXIB 200 MG PO CAPS
200.0000 mg | ORAL_CAPSULE | Freq: Two times a day (BID) | ORAL | Status: DC
  Administered 2015-09-14 – 2015-09-15 (×3): 200 mg via ORAL
  Filled 2015-09-13 (×4): qty 1

## 2015-09-13 MED ORDER — FENTANYL CITRATE (PF) 100 MCG/2ML IJ SOLN
INTRAMUSCULAR | Status: AC
  Filled 2015-09-13: qty 2

## 2015-09-13 MED ORDER — DIPHENHYDRAMINE HCL 25 MG PO CAPS
25.0000 mg | ORAL_CAPSULE | Freq: Four times a day (QID) | ORAL | Status: DC | PRN
  Administered 2015-09-13 – 2015-09-14 (×2): 25 mg via ORAL
  Filled 2015-09-13 (×2): qty 1

## 2015-09-13 MED ORDER — HYDROMORPHONE HCL 2 MG/ML IJ SOLN
INTRAMUSCULAR | Status: AC
  Filled 2015-09-13: qty 1

## 2015-09-13 MED ORDER — HYDROMORPHONE HCL 1 MG/ML IJ SOLN
0.5000 mg | INTRAMUSCULAR | Status: DC | PRN
  Administered 2015-09-13: 0.5 mg via INTRAVENOUS
  Administered 2015-09-13: 2 mg via INTRAVENOUS
  Administered 2015-09-14: 1 mg via INTRAVENOUS
  Filled 2015-09-13: qty 1
  Filled 2015-09-13: qty 2
  Filled 2015-09-13: qty 1

## 2015-09-13 MED ORDER — POLYETHYLENE GLYCOL 3350 17 G PO PACK
17.0000 g | PACK | Freq: Two times a day (BID) | ORAL | Status: DC
  Administered 2015-09-14: 17 g via ORAL
  Filled 2015-09-13 (×3): qty 1

## 2015-09-13 MED ORDER — HYDROMORPHONE HCL 1 MG/ML IJ SOLN
INTRAMUSCULAR | Status: DC | PRN
  Administered 2015-09-13 (×2): 1 mg via INTRAVENOUS

## 2015-09-13 MED ORDER — LACTATED RINGERS IV SOLN
INTRAVENOUS | Status: DC
  Administered 2015-09-13: 15:00:00 via INTRAVENOUS

## 2015-09-13 MED ORDER — MIDAZOLAM HCL 2 MG/2ML IJ SOLN
INTRAMUSCULAR | Status: AC
  Filled 2015-09-13: qty 2

## 2015-09-13 MED ORDER — ONDANSETRON HCL 4 MG/2ML IJ SOLN: 4.0000 mg | Freq: Four times a day (QID) | INTRAMUSCULAR | Status: DC | PRN

## 2015-09-13 MED ORDER — RIVAROXABAN 10 MG PO TABS
10.0000 mg | ORAL_TABLET | ORAL | Status: DC
  Administered 2015-09-14 – 2015-09-15 (×2): 10 mg via ORAL
  Filled 2015-09-13 (×2): qty 1

## 2015-09-13 MED ORDER — SODIUM CHLORIDE 0.9 % IV SOLN
INTRAVENOUS | Status: DC
  Administered 2015-09-13 – 2015-09-14 (×2): via INTRAVENOUS

## 2015-09-13 MED ORDER — SODIUM CHLORIDE 0.9 % IJ SOLN
INTRAMUSCULAR | Status: AC
  Filled 2015-09-13: qty 50

## 2015-09-13 MED ORDER — MAGNESIUM CITRATE PO SOLN: 1.0000 | Freq: Once | ORAL | Status: DC | PRN

## 2015-09-13 MED ORDER — ALUM & MAG HYDROXIDE-SIMETH 200-200-20 MG/5ML PO SUSP: 30.0000 mL | ORAL | Status: DC | PRN

## 2015-09-13 MED ORDER — KETOROLAC TROMETHAMINE 15 MG/ML IJ SOLN
15.0000 mg | Freq: Four times a day (QID) | INTRAMUSCULAR | Status: DC
  Administered 2015-09-13 – 2015-09-15 (×6): 15 mg via INTRAVENOUS
  Filled 2015-09-13 (×6): qty 1

## 2015-09-13 MED ORDER — CEFAZOLIN SODIUM-DEXTROSE 2-4 GM/100ML-% IV SOLN
INTRAVENOUS | Status: AC
  Filled 2015-09-13: qty 100

## 2015-09-13 MED ORDER — BUPIVACAINE-EPINEPHRINE 0.25% -1:200000 IJ SOLN
INTRAMUSCULAR | Status: DC | PRN
  Administered 2015-09-13: 15 mL

## 2015-09-13 MED ORDER — GLYCOPYRROLATE 0.2 MG/ML IJ SOLN
INTRAMUSCULAR | Status: DC | PRN
  Administered 2015-09-13: 0.2 mg via INTRAVENOUS

## 2015-09-13 MED ORDER — BUPIVACAINE-EPINEPHRINE (PF) 0.25% -1:200000 IJ SOLN
INTRAMUSCULAR | Status: AC
  Filled 2015-09-13: qty 30

## 2015-09-13 MED ORDER — TRANEXAMIC ACID 1000 MG/10ML IV SOLN
1000.0000 mg | Freq: Once | INTRAVENOUS | Status: AC
  Administered 2015-09-13: 1000 mg via INTRAVENOUS
  Filled 2015-09-13: qty 10

## 2015-09-13 MED ORDER — OXYCODONE HCL 5 MG PO TABS
5.0000 mg | ORAL_TABLET | ORAL | Status: DC
  Administered 2015-09-13: 5 mg via ORAL
  Administered 2015-09-13 – 2015-09-15 (×11): 15 mg via ORAL
  Filled 2015-09-13: qty 3
  Filled 2015-09-13: qty 1
  Filled 2015-09-13 (×10): qty 3

## 2015-09-13 MED ORDER — ONDANSETRON HCL 4 MG PO TABS: 4.0000 mg | ORAL_TABLET | Freq: Four times a day (QID) | ORAL | Status: DC | PRN

## 2015-09-13 MED ORDER — FENTANYL CITRATE (PF) 100 MCG/2ML IJ SOLN: 25.0000 ug | INTRAMUSCULAR | Status: DC | PRN

## 2015-09-13 MED ORDER — METOCLOPRAMIDE HCL 5 MG PO TABS: 5.0000 mg | ORAL_TABLET | Freq: Three times a day (TID) | ORAL | Status: DC | PRN

## 2015-09-13 MED ORDER — SODIUM CHLORIDE 0.9 % IR SOLN
Status: DC | PRN
  Administered 2015-09-13: 1000 mL

## 2015-09-13 MED ORDER — GLYCOPYRROLATE 0.2 MG/ML IJ SOLN
INTRAMUSCULAR | Status: AC
  Filled 2015-09-13: qty 1

## 2015-09-13 MED ORDER — SODIUM CHLORIDE 0.9 % IR SOLN
Status: DC | PRN
  Administered 2015-09-13: 3000 mL

## 2015-09-13 MED ORDER — DEXAMETHASONE SODIUM PHOSPHATE 10 MG/ML IJ SOLN
INTRAMUSCULAR | Status: AC
  Filled 2015-09-13: qty 1

## 2015-09-13 MED ORDER — STERILE WATER FOR IRRIGATION IR SOLN
Status: DC | PRN
  Administered 2015-09-13: 2000 mL

## 2015-09-13 MED ORDER — SODIUM CHLORIDE 0.9 % IV SOLN
1000.0000 mg | Freq: Once | INTRAVENOUS | Status: AC
  Administered 2015-09-13: 1000 mg via INTRAVENOUS
  Filled 2015-09-13: qty 10

## 2015-09-13 MED ORDER — SODIUM CHLORIDE 0.9 % IJ SOLN
INTRAMUSCULAR | Status: DC | PRN
  Administered 2015-09-13: 15 mL

## 2015-09-13 MED ORDER — MIDAZOLAM HCL 5 MG/5ML IJ SOLN
INTRAMUSCULAR | Status: DC | PRN
  Administered 2015-09-13 (×2): 2 mg via INTRAVENOUS

## 2015-09-13 MED ORDER — PROPOFOL 10 MG/ML IV BOLUS
INTRAVENOUS | Status: DC | PRN
  Administered 2015-09-13: 30 mg via INTRAVENOUS

## 2015-09-13 MED ORDER — PROPOFOL 500 MG/50ML IV EMUL
INTRAVENOUS | Status: DC | PRN
  Administered 2015-09-13: 50 ug/kg/min via INTRAVENOUS

## 2015-09-13 MED ORDER — KETOROLAC TROMETHAMINE 30 MG/ML IJ SOLN
INTRAMUSCULAR | Status: AC
  Filled 2015-09-13: qty 1

## 2015-09-13 MED ORDER — FENTANYL CITRATE (PF) 100 MCG/2ML IJ SOLN
INTRAMUSCULAR | Status: DC | PRN
  Administered 2015-09-13: 100 ug via INTRAVENOUS

## 2015-09-13 MED ORDER — CEFAZOLIN SODIUM-DEXTROSE 2-4 GM/100ML-% IV SOLN
2.0000 g | INTRAVENOUS | Status: AC
Start: 2015-09-13 — End: 2015-09-13
  Administered 2015-09-13: 2 g via INTRAVENOUS

## 2015-09-13 MED ORDER — PHENYLEPHRINE HCL 10 MG/ML IJ SOLN
INTRAMUSCULAR | Status: AC
  Filled 2015-09-13: qty 1

## 2015-09-13 MED ORDER — PROPOFOL 10 MG/ML IV BOLUS
INTRAVENOUS | Status: AC
  Filled 2015-09-13: qty 40

## 2015-09-13 MED ORDER — BUPIVACAINE HCL (PF) 0.5 % IJ SOLN
INTRAMUSCULAR | Status: DC | PRN
  Administered 2015-09-13: 3 mL

## 2015-09-13 MED ORDER — DEXAMETHASONE SODIUM PHOSPHATE 10 MG/ML IJ SOLN
INTRAMUSCULAR | Status: DC | PRN
  Administered 2015-09-13: 10 mg via INTRAVENOUS

## 2015-09-13 MED ORDER — BUPIVACAINE-EPINEPHRINE (PF) 0.25% -1:200000 IJ SOLN
INTRAMUSCULAR | Status: DC | PRN
  Administered 2015-09-13: 15 mL

## 2015-09-13 MED ORDER — FERROUS SULFATE 325 (65 FE) MG PO TABS
325.0000 mg | ORAL_TABLET | Freq: Three times a day (TID) | ORAL | Status: DC
  Administered 2015-09-14 – 2015-09-15 (×3): 325 mg via ORAL
  Filled 2015-09-13 (×3): qty 1

## 2015-09-13 MED ORDER — LACTATED RINGERS IV SOLN
INTRAVENOUS | Status: DC
Start: 2015-09-13 — End: 2015-09-13
  Administered 2015-09-13 (×3): via INTRAVENOUS

## 2015-09-13 MED ORDER — METOCLOPRAMIDE HCL 5 MG/ML IJ SOLN: 5.0000 mg | Freq: Three times a day (TID) | INTRAMUSCULAR | Status: DC | PRN

## 2015-09-13 SURGICAL SUPPLY — 56 items
BAG ZIPLOCK 12X15 (MISCELLANEOUS) ×2 IMPLANT
BANDAGE ACE 6X5 VEL STRL LF (GAUZE/BANDAGES/DRESSINGS) ×4 IMPLANT
BANDAGE ESMARK 6X9 LF (GAUZE/BANDAGES/DRESSINGS) ×2 IMPLANT
BLADE SAW SGTL 13.0X1.19X90.0M (BLADE) ×4 IMPLANT
BLADE SURG SZ10 CARB STEEL (BLADE) ×2 IMPLANT
BNDG COHESIVE 4X5 TAN STRL (GAUZE/BANDAGES/DRESSINGS) ×2 IMPLANT
BNDG ESMARK 6X9 LF (GAUZE/BANDAGES/DRESSINGS) ×4
BOWL SMART MIX CTS (DISPOSABLE) ×4 IMPLANT
CAPT KNEE TOTAL 3 ATTUNE ×4 IMPLANT
CEMENT HV SMART SET (Cement) ×8 IMPLANT
CLOTH BEACON ORANGE TIMEOUT ST (SAFETY) ×2 IMPLANT
CUFF TOURN SGL QUICK 34 (TOURNIQUET CUFF) ×2
CUFF TRNQT CYL 34X4X40X1 (TOURNIQUET CUFF) ×2 IMPLANT
DECANTER SPIKE VIAL GLASS SM (MISCELLANEOUS) ×2 IMPLANT
DRAPE EXTREMITY BILATERAL (DRAPES) ×2 IMPLANT
DRAPE INCISE IOBAN 66X45 STRL (DRAPES) ×2 IMPLANT
DRAPE U-SHAPE 47X51 STRL (DRAPES) ×6 IMPLANT
DRESSING AQUACEL AG SP 3.5X10 (GAUZE/BANDAGES/DRESSINGS) ×2 IMPLANT
DRSG AQUACEL AG SP 3.5X10 (GAUZE/BANDAGES/DRESSINGS) ×4
DURAPREP 26ML APPLICATOR (WOUND CARE) ×4 IMPLANT
ELECT REM PT RETURN 9FT ADLT (ELECTROSURGICAL) ×2
ELECTRODE REM PT RTRN 9FT ADLT (ELECTROSURGICAL) ×1 IMPLANT
FACESHIELD WRAPAROUND (MASK) ×12 IMPLANT
GLOVE BIO SURGEON STRL SZ7 (GLOVE) ×2 IMPLANT
GLOVE BIOGEL PI IND STRL 7.0 (GLOVE) ×1 IMPLANT
GLOVE BIOGEL PI IND STRL 7.5 (GLOVE) ×4 IMPLANT
GLOVE BIOGEL PI IND STRL 8.5 (GLOVE) ×2 IMPLANT
GLOVE BIOGEL PI INDICATOR 7.0 (GLOVE) ×1
GLOVE BIOGEL PI INDICATOR 7.5 (GLOVE) ×4
GLOVE BIOGEL PI INDICATOR 8.5 (GLOVE) ×2
GLOVE ECLIPSE 8.0 STRL XLNG CF (GLOVE) ×4 IMPLANT
GLOVE INDICATOR 6.5 STRL GRN (GLOVE) ×2 IMPLANT
GLOVE ORTHO TXT STRL SZ7.5 (GLOVE) ×4 IMPLANT
GLOVE SURG SS PI 7.5 STRL IVOR (GLOVE) ×2 IMPLANT
GOWN STRL REUS W/TWL LRG LVL3 (GOWN DISPOSABLE) ×2 IMPLANT
GOWN STRL REUS W/TWL XL LVL3 (GOWN DISPOSABLE) ×8 IMPLANT
HANDPIECE INTERPULSE COAX TIP (DISPOSABLE) ×1
LIQUID BAND (GAUZE/BANDAGES/DRESSINGS) ×4 IMPLANT
MANIFOLD NEPTUNE II (INSTRUMENTS) ×2 IMPLANT
NDL SAFETY ECLIPSE 18X1.5 (NEEDLE) ×2 IMPLANT
NEEDLE HYPO 18GX1.5 SHARP (NEEDLE) ×2
PACK TOTAL KNEE CUSTOM (KITS) ×2 IMPLANT
SET HNDPC FAN SPRY TIP SCT (DISPOSABLE) ×1 IMPLANT
SET PAD KNEE POSITIONER (MISCELLANEOUS) ×4 IMPLANT
SPONGE LAP 18X18 X RAY DECT (DISPOSABLE) ×2 IMPLANT
STOCKINETTE 8 INCH (MISCELLANEOUS) ×2 IMPLANT
SUT MNCRL AB 4-0 PS2 18 (SUTURE) ×4 IMPLANT
SUT VIC AB 1 CT1 36 (SUTURE) ×4 IMPLANT
SUT VIC AB 2-0 CT1 27 (SUTURE) ×4
SUT VIC AB 2-0 CT1 TAPERPNT 27 (SUTURE) ×4 IMPLANT
SUT VLOC 180 0 24IN GS25 (SUTURE) ×4 IMPLANT
SYRINGE 60CC LL (MISCELLANEOUS) ×2 IMPLANT
TOWEL OR 17X26 10 PK STRL BLUE (TOWEL DISPOSABLE) ×2 IMPLANT
TRAY FOLEY W/METER SILVER 16FR (SET/KITS/TRAYS/PACK) ×2 IMPLANT
WRAP KNEE MAXI GEL POST OP (GAUZE/BANDAGES/DRESSINGS) ×4 IMPLANT
YANKAUER SUCT BULB TIP 10FT TU (MISCELLANEOUS) ×2 IMPLANT

## 2015-09-13 NOTE — Interval H&P Note (Signed)
History and Physical Interval Note:  09/13/2015 9:17 AM  Thomas [name]  has presented today for surgery, with the diagnosis of BILATERAL KNEE OA   The various methods of treatment have been discussed with the patient and family. After consideration of risks, benefits and other options for treatment, the patient has consented to  Procedure(s): BILATERAL KNEE ARTHROPLASTY  (Bilateral) as a surgical intervention .  The patient's history has been reviewed, patient examined, no change in status, stable for surgery.  I have reviewed the patient's chart and labs.  Questions were answered to the patient's satisfaction.     Mauri Pole

## 2015-09-13 NOTE — Anesthesia Preprocedure Evaluation (Signed)
Anesthesia Evaluation  Patient identified by MRN, date of birth, ID band Patient awake    Reviewed: Allergy & Precautions, H&P , NPO status , Patient's Chart, lab work & pertinent test results  Airway Mallampati: II  TM Distance: >3 FB Neck ROM: full    Dental no notable dental hx. (+) Dental Advisory Given, Teeth Intact   Pulmonary neg pulmonary ROS,    Pulmonary exam normal breath sounds clear to auscultation       Cardiovascular Exercise Tolerance: Good hypertension, Pt. on medications negative cardio ROS Normal cardiovascular exam Rhythm:regular Rate:Normal     Neuro/Psych Anxiety claustrophobianegative neurological ROS  negative psych ROS   GI/Hepatic negative GI ROS, Neg liver ROS,   Endo/Other  negative endocrine ROS  Renal/GU negative Renal ROS  negative genitourinary   Musculoskeletal   Abdominal   Peds  Hematology negative hematology ROS (+)   Anesthesia Other Findings   Reproductive/Obstetrics negative OB ROS                             Anesthesia Physical Anesthesia Plan  ASA: II  Anesthesia Plan: Spinal   Post-op Pain Management:    Induction:   Airway Management Planned:   Additional Equipment:   Intra-op Plan:   Post-operative Plan:   Informed Consent: I have reviewed the patients History and Physical, chart, labs and discussed the procedure including the risks, benefits and alternatives for the proposed anesthesia with the patient or authorized representative who has indicated his/her understanding and acceptance.   Dental Advisory Given  Plan Discussed with: CRNA  Anesthesia Plan Comments:         Anesthesia Quick Evaluation

## 2015-09-13 NOTE — Anesthesia Procedure Notes (Addendum)
Spinal Patient location during procedure: OR End time: 09/13/2015 10:18 AM Staffing Resident/CRNA: Enrigue Catena E Performed by: resident/CRNA  Preanesthetic Checklist Completed: patient identified, site marked, surgical consent, pre-op evaluation, timeout performed, IV checked, risks and benefits discussed and monitors and equipment checked Spinal Block Patient position: sitting Prep: Betadine Patient monitoring: heart rate, continuous pulse ox and blood pressure Approach: right paramedian Location: L3-4 Injection technique: single-shot Needle Needle type: Sprotte  Needle gauge: 24 G Needle length: 9 cm Assessment Sensory level: T6 Additional Notes Expiration date of kit checked and confirmed. Patient tolerated procedure well, without complications.

## 2015-09-13 NOTE — Addendum Note (Signed)
Addendum  created 09/13/15 1504 by Lissa Morales, CRNA   Modules edited: Anesthesia Blocks and Procedures, Anesthesia Medication Administration, Clinical Notes   Clinical Notes:  File: [id number]

## 2015-09-13 NOTE — Transfer of Care (Signed)
Immediate Anesthesia Transfer of Care Note  Patient: Thomas [name]  Procedure(s) Performed: Procedure(s): BILATERAL KNEE ARTHROPLASTY  (Bilateral)  Patient Location: PACU  Anesthesia Type:Spinal  Level of Consciousness: awake, alert , oriented and patient cooperative  Airway & Oxygen Therapy: Patient Spontanous Breathing and Patient connected to face mask oxygen  Post-op Assessment: Report given to RN and Post -op Vital signs reviewed and stable  Post vital signs: stable  Last Vitals:  Filed Vitals:   09/13/15 0728 09/13/15 1335  BP: 153/86 120/75  Pulse: 85 80  Temp: 36.4 C 36.8 C  Resp: 18 15    Last Pain: There were no vitals filed for this visit.    Patients Stated Pain Goal: 4 ([id number])  Complications: No apparent anesthesia complications  L2 spinal level

## 2015-09-13 NOTE — Anesthesia Postprocedure Evaluation (Signed)
Anesthesia Post Note  Patient: Thomas [name]  Procedure(s) Performed: Procedure(s) (LRB): BILATERAL KNEE ARTHROPLASTY  (Bilateral)  Patient location during evaluation: PACU Anesthesia Type: Spinal Level of consciousness: awake and alert Pain management: pain level controlled Vital Signs Assessment: post-procedure vital signs reviewed and stable Respiratory status: spontaneous breathing, nonlabored ventilation, respiratory function stable and patient connected to nasal cannula oxygen Cardiovascular status: blood pressure returned to baseline and stable Postop Assessment: no signs of nausea or vomiting Anesthetic complications: no    Last Vitals:  Filed Vitals:   09/13/15 1415 09/13/15 1430  BP: 123/77 124/83  Pulse: 73 64  Temp:    Resp: 14 9    Last Pain: There were no vitals filed for this visit.               Mystie Ormand L

## 2015-09-13 NOTE — Op Note (Signed)
NAME:  Thomas [name]                      MEDICAL RECORD NO.:  [id number]                             FACILITY:  [facility]      PHYSICIAN:  Pietro Cassis. Alvan Dame, M.D.  DATE OF BIRTH:  1950-06-17      DATE OF PROCEDURE:  09/13/2015                                     OPERATIVE REPORT         PREOPERATIVE DIAGNOSIS:  Bilateral knee osteoarthritis.      POSTOPERATIVE DIAGNOSIS:  Bilateral knee osteoarthritis.      FINDINGS:  The patient was noted to have complete loss of cartilage and   bone-on-bone arthritis with associated osteophytes in the medial and patellofemoral compartments of   the knee with a significant synovitis and associated effusion.      PROCEDURE:  Left total knee replacement.      COMPONENTS USED:  DePuy Attune rotating platform posterior stabilized knee   system, a size 9 femur, 8 tibia, size 7 mm PS AOX insert, and 41 anatomic patellar   button.      SURGEON:  Pietro Cassis. Alvan Dame, M.D.      ASSISTANT:  Danae Orleans, PA-C.      ANESTHESIA:  Spinal.      SPECIMENS:  None.      COMPLICATION:  None.      DRAINS:  None.  EBL: <100cc      TOURNIQUET TIME:  * Missing tourniquet times found for documented tourniquets in log:  [id number] * Total Tourniquet Time Documented: Thigh (Left) - 39 minutes Total: Thigh (Left) - 39 minutes  Thigh (Right) - 43 minutes Total: Thigh (Right) - 43 minutes  .      The patient was stable to the recovery room.      INDICATION FOR PROCEDURE:  Thomas [name] is a 65 y.o. male patient of   mine.  The patient had been seen, evaluated, and treated conservatively in the   office with medication, activity modification, and injections.  The patient had   radiographic changes of bone-on-bone arthritis with endplate sclerosis and osteophytes noted.      The patient failed conservative measures including medication, injections, and activity modification, and at this point was ready for more definitive measures.   Based on the radiographic  changes and failed conservative measures, the patient   decided to proceed with total knee replacement.  Risks of infection,   DVT, component failure, need for revision surgery, postop course, and   expectations were all   discussed and reviewed.  Consent was obtained for benefit of pain   relief.      PROCEDURE IN DETAIL:  The patient was brought to the operative theater.   Once adequate anesthesia, preoperative antibiotics, 2 gm of Ancef, 1 gm of Tranexamic Acid, and 10 mg of Decadron administered, the patient was positioned supine with bilateral thigh tourniquets placed.  Both lower extremities were prepped and draped in sterile fashion.  A time-   out was performed identifying the patient, planned procedures, and at first the left then the right extremities.   *LEFT KNEE FIRST*     The  both lower extremities were placed in [facility] leg holders.  The left lower extremity was   exsanguinated, tourniquet elevated to 250 mmHg.  A midline incision was   made followed by median parapatellar arthrotomy.  Following initial   exposure, attention was first directed to the patella.  Precut   measurement was noted to be 26-27 mm.  I resected down to 15 mm and used a   41 anatomic patellar button to restore patellar height as well as cover the cut   surface.      The lug holes were drilled and a metal shim was placed to protect the   patella from retractors and saw blades.      At this point, attention was now directed to the femur.  The femoral   canal was opened with a drill, irrigated to try to prevent fat emboli.  An   intramedullary rod was passed at 5 degrees valgus, 9 mm of bone was   resected off the distal femur.  Following this resection, the tibia was   subluxated anteriorly.  Using the extramedullary guide, 2 mm of bone was resected off   the proximal medial tibia.  We confirmed the gap would be   stable medially and laterally with a size 6mm insert as well as confirmed   the cut was  perpendicular in the coronal plane, checking with an alignment rod.      Once this was done, I sized the femur to be a size 9 in the anterior-   posterior dimension, chose a standard component based on medial and   lateral dimension.  The size 9 rotation block was then pinned in   position anterior referenced using the C-clamp to set rotation.  The   anterior, posterior, and  chamfer cuts were made without difficulty nor   notching making certain that I was along the anterior cortex to help   with flexion gap stability.      The final box cut was made off the lateral aspect of distal femur.      At this point, the tibia was sized to be a size 8, the size 8 tray was   then pinned in position through the medial third of the tubercle,   drilled, and keel punched.  Significant medial based osteophytes were debrided.  Trial reduction was now carried with a 9 femur,  8 tibia, a size 6 then 7 mm insert, and the 41 patella botton.  The knee was brought to   extension, full extension with good flexion stability with the patella   tracking through the trochlea without application of pressure.  Given   all these findings the femoral lug holes were drilled and then the trial components removed.  Final components were   opened and cement was mixed.  The knee was irrigated with normal saline   solution and pulse lavage.  The synovial lining was   then injected with  15 cc 0.25% Marcaine with epinephrine and 1 cc of Toradol and 15 cc of NS for a total of 61 cc.      The knee was irrigated.  Final implants were then cemented onto clean and   dried cut surfaces of bone with the knee brought to extension with a size 7 mm trial insert.      Once the cement had fully cured, the excess cement was removed   throughout the knee.  I confirmed I was satisfied with the range of  motion and stability, and the final size 7 PS AOX mm insert was chosen.  It was   placed into the knee.      The tourniquet had been  let down at 39 minutes.  No significant   hemostasis required.  The   extensor mechanism was then reapproximated using #1 Vicryl and #0 V-lock sutures with the knee   in flexion.  The   remaining wound was closed with 2-0 Vicryl and running 4-0 Monocryl.   The knee was cleaned, dried, dressed sterilely using Dermabond and   Aquacel dressing.   Please note that Physician Assistant, Danae Orleans, PA-C, was present for the entirety of the case, and was utilized for pre-operative positioning, peri-operative retractor management, general facilitation of the procedure.  He was also utilized for primary wound closure at the end of the case.      PHYSICIAN:  Pietro Cassis. Alvan Dame, M.D.  DATE OF BIRTH:  [birth year]      DATE OF PROCEDURE:  09/13/2015                                     OPERATIVE REPORT   *RIGHT KNEE* second        PREOPERATIVE DIAGNOSIS:  Bilateral knee osteoarthritis.      POSTOPERATIVE DIAGNOSIS:  Bilateral knee osteoarthritis.      FINDINGS:  The patient was noted to have complete loss of cartilage and   bone-on-bone arthritis with associated osteophytes in the medial and patellofemoral compartments of   the knee with a significant synovitis and associated effusion.      PROCEDURE:  Right total knee replacement.      COMPONENTS USED:  DePuy Attune rotating platform posterior stabilized knee   system, a size 9 femur, 9 tibia, size 7 mm insert, and 42 patellar   button.      SURGEON:  Pietro Cassis. Alvan Dame, M.D.      ASSISTANT:  Danae Orleans, PA-C.      ANESTHESIA:  Spinal.      SPECIMENS:  None.      COMPLICATION:  None.      DRAINS:  None.  EBL: <100cc      TOURNIQUET TIME:  * Missing tourniquet times found for documented tourniquets in log:  [id number] * Total Tourniquet Time Documented: Thigh (Left) - 39 minutes Total: Thigh (Left) - 39 minutes  Thigh (Right) - 43 minutes Total: Thigh (Right) - 43 minutes  .      The patient was stable to the recovery room.       INDICATION FOR PROCEDURE:  Thomas [name] is a 65 y.o. male patient of   mine.  The patient had been seen, evaluated, and treated conservatively in the   office with medication, activity modification, and injections.  The patient had   radiographic changes of bone-on-bone arthritis with endplate sclerosis and osteophytes noted.      The patient failed conservative measures including medication, injections, and activity modification, and at this point was ready for more definitive measures.   Based on the radiographic changes and failed conservative measures, the patient   decided to proceed with total knee replacement.  Risks of infection,   DVT, component failure, need for revision surgery, postop course, and   expectations were all   discussed and reviewed.  Consent was obtained for benefit of pain   relief.      PROCEDURE IN  DETAIL:  Once the capsule of the left knee was closed I directed attention to the right knee.     The both lower extremities were placed in the [facility] leg holder.  The leg was   exsanguinated, tourniquet elevated to 250 mmHg.  A midline incision was   made followed by median parapatellar arthrotomy.  Following initial   exposure, attention was first directed to the patella.  Precut   measurement was noted to be 26 mm.  I resected down to 15 mm and used a   41 patellar button to restore patellar height as well as cover the cut   surface.      The lug holes were drilled and a metal shim was placed to protect the   patella from retractors and saw blades.      At this point, attention was now directed to the femur.  The femoral   canal was opened with a drill, irrigated to try to prevent fat emboli.  An   intramedullary rod was passed at 5 degrees valgus, 9 mm of bone was   resected off the distal femur.  Following this resection, the tibia was   subluxated anteriorly.  Using the extramedullary guide, 2 mm of bone was resected off   the proximal medial tibia.   We confirmed the gap would be   stable medially and laterally with a size 6 mm insert as well as confirmed   the cut was perpendicular in the coronal plane, checking with an alignment rod.      Once this was done, I sized the femur to be a size 9 in the anterior-   posterior dimension, chose a standard component based on medial and   lateral dimension.  The size 9 rotation block was then pinned in   position anterior referenced using the C-clamp to set rotation.  The   anterior, posterior, and  chamfer cuts were made without difficulty nor   notching making certain that I was along the anterior cortex to help   with flexion gap stability.      The final box cut was made off the lateral aspect of distal femur.      At this point, the tibia was sized to be a size 9, the size 9 tray was   then pinned in position through the medial third of the tubercle,   drilled, and keel punched.  Trial reduction was now carried with a 9 femur,  9 tibia, a size 6 then 7 mm insert, and the 41 patella botton.  The knee was brought to   extension, full extension with good flexion stability with the patella   tracking through the trochlea without application of pressure.  Given   all these findings the femoral lug holes were drilled, the trial components removed.  Final components were   opened and cement was mixed.  The knee was irrigated with normal saline   solution and pulse lavage.  The synovial lining was   then injected with 15 cc 0.25% Marcaine with epinephrine and 1 cc of Toradol pluse 10 cc of NS for a  total of 61 cc.      The knee was irrigated.  Final implants were then cemented onto clean and   dried cut surfaces of bone with the knee brought to extension with a size 7 mm trial insert.      Once the cement had fully cured, the excess cement was removed   throughout the  knee.  I confirmed I was satisfied with the range of   motion and stability, and the final size 7 mm PS AOX insert was chosen.   It was   placed into the knee.      The tourniquet had been let down at 43 minutes.  No significant   hemostasis required.  The   extensor mechanism was then reapproximated using #1 Vicryl and #0 V-lock sutures with the knee   in flexion.  The   remaining wound was closed with 2-0 Vicryl and running 4-0 Monocryl.   The knee was cleaned, dried, dressed sterilely using Dermabond and   Aquacel dressing.  The patient was then   brought to recovery room in stable condition, tolerating the procedure   well.   Please note that Physician Assistant, Danae Orleans, PA-C, was present for the entirety of the case, and was utilized for pre-operative positioning, peri-operative retractor management, general facilitation of the procedure.  He was also utilized for primary wound closure at the end of the case.              Pietro Cassis Alvan Dame, M.D.    09/13/2015 1:35 PM

## 2015-09-14 LAB — BASIC METABOLIC PANEL
Anion gap: 5 (ref 5–15)
BUN: 21 mg/dL — ABNORMAL HIGH (ref 6–20)
CO2: 30 mmol/L (ref 22–32)
Calcium: 8.6 mg/dL — ABNORMAL LOW (ref 8.9–10.3)
Chloride: 102 mmol/L (ref 101–111)
Creatinine, Ser: 0.79 mg/dL (ref 0.61–1.24)
GFR calc Af Amer: >60 mL/min (ref >60)
GFR calc non Af Amer: >60 mL/min (ref >60)
Glucose, Bld: 140 mg/dL — ABNORMAL HIGH (ref 65–99)
Potassium: 4.8 mmol/L (ref 3.5–5.1)
Sodium: 137 mmol/L (ref 135–145)

## 2015-09-14 LAB — CBC
HCT: 35.8 % — ABNORMAL LOW (ref 39.0–52.0)
Hemoglobin: 12.3 g/dL — ABNORMAL LOW (ref 13.0–17.0)
MCH: 30 pg (ref 26.0–34.0)
MCHC: 34.4 g/dL (ref 30.0–36.0)
MCV: 87.3 fL (ref 78.0–100.0)
Platelets: 227 10*3/uL (ref 150–400)
RBC: 4.1 MIL/uL — ABNORMAL LOW (ref 4.22–5.81)
RDW: 13.1 % (ref 11.5–15.5)
WBC: 11.9 10*3/uL — ABNORMAL HIGH (ref 4.0–10.5)

## 2015-09-14 MED ORDER — DOCUSATE SODIUM 100 MG PO CAPS: 100.0000 mg | ORAL_CAPSULE | Freq: Two times a day (BID) | ORAL | Status: DC

## 2015-09-14 MED ORDER — OXYCODONE HCL 5 MG PO TABS: 5.0000 mg | ORAL_TABLET | ORAL | Status: DC | PRN

## 2015-09-14 MED ORDER — RIVAROXABAN 10 MG PO TABS: 10.0000 mg | ORAL_TABLET | ORAL | Status: DC

## 2015-09-14 MED ORDER — CYCLOBENZAPRINE HCL 10 MG PO TABS: 10.0000 mg | ORAL_TABLET | Freq: Three times a day (TID) | ORAL | Status: DC | PRN

## 2015-09-14 MED ORDER — CYCLOBENZAPRINE HCL 10 MG PO TABS
10.0000 mg | ORAL_TABLET | Freq: Three times a day (TID) | ORAL | Status: DC | PRN
  Administered 2015-09-14 – 2015-09-15 (×4): 10 mg via ORAL
  Filled 2015-09-14 (×4): qty 1

## 2015-09-14 MED ORDER — FERROUS SULFATE 325 (65 FE) MG PO TABS: 325.0000 mg | ORAL_TABLET | Freq: Three times a day (TID) | ORAL | Status: DC

## 2015-09-14 MED ORDER — POLYETHYLENE GLYCOL 3350 17 G PO PACK: 17.0000 g | PACK | Freq: Two times a day (BID) | ORAL | Status: DC

## 2015-09-14 MED ORDER — CELECOXIB 200 MG PO CAPS: 200.0000 mg | ORAL_CAPSULE | Freq: Two times a day (BID) | ORAL | Status: DC

## 2015-09-14 MED ORDER — ASPIRIN EC 325 MG PO TBEC: 325.0000 mg | DELAYED_RELEASE_TABLET | Freq: Two times a day (BID) | ORAL | Status: AC

## 2015-09-14 NOTE — Care Management Note (Signed)
Case Management Note  Patient Details  Name: Thomas [name] MRN: [id number] Date of Birth: November 01, 1950  Subjective/Objective:                  Bilateral knee osteoarthritis.  Action/Plan: Discharge planning Expected Discharge Date:  09/14/15               Expected Discharge Plan:  Home/Self Care  In-House Referral:     Discharge planning Services  CM Consult  Post Acute Care Choice:  NA Choice offered to:  Patient  DME Arranged:  N/A DME Agency:  NA  HH Arranged:  NA HH Agency:  NA  Status of Service:  Completed, signed off  Medicare Important Message Given:    Date Medicare IM Given:    Medicare IM give by:    Date Additional Medicare IM Given:    Additional Medicare Important Message give by:     If discussed at [place] of Stay Meetings, dates discussed:    Additional Comments: CM met with pt who declines HHPT and requests outpt PT with [facility].  Agricultural consultant, Amy notifies [place], [place].  Contact information has been placed on pt's AVS to call for an outpt PT appointment.  Pt has arranged for his own DME and denies need for additional DME.  No other CM needs were communicated. Dellie Catholic, RN 09/14/2015, 10:42 AM

## 2015-09-14 NOTE — Evaluation (Signed)
Physical Therapy Evaluation Patient Details Name: Thomas [name] MRN: [id number] DOB: [birth year] Today's Date: 09/14/2015   History of Present Illness  B TKA  Clinical Impression  Pt is s/p TKA resulting in the deficits listed below (see PT Problem List). Pt ambulated 36' with RW and performed B TKA exercises with min A. Excellent progress expected.  Pt will benefit from skilled PT to increase their independence and safety with mobility to allow discharge to the venue listed below.      Follow Up Recommendations Outpatient PT    Equipment Recommendations  None recommended by PT    Recommendations for Other Services       Precautions / Restrictions Precautions Precautions: Knee;Fall Precaution Comments: no h/o falls Restrictions Weight Bearing Restrictions: No Other Position/Activity Restrictions: WBAT BLEs      Mobility  Bed Mobility Overal bed mobility: Needs Assistance Bed Mobility: Supine to Sit     Supine to sit: Min assist;HOB elevated     General bed mobility comments: HOB up, min A to support BLEs  Transfers Overall transfer level: Needs assistance Equipment used: Rolling walker (2 wheeled) Transfers: Sit to/from Stand Sit to Stand: Min assist;From elevated surface         General transfer comment: verbal cues for hand placement and management of BLEs, pulled up on RW  Ambulation/Gait Ambulation/Gait assistance: Min guard Ambulation Distance (Feet): 65 Feet Assistive device: Rolling walker (2 wheeled) Gait Pattern/deviations: Step-through pattern;Decreased step length - left;Decreased stance time - right;Wide base of support   Gait velocity interpretation: Below normal speed for age/gender General Gait Details: steady with RW, no LOB, VCs to lift head, decr knee flexion B  Stairs            Wheelchair Mobility    Modified Rankin (Stroke Patients Only)       Balance Overall balance assessment: Modified Independent                                            Pertinent Vitals/Pain Pain Assessment: 0-10 Pain Score: 6  Pain Location: B knees Pain Descriptors / Indicators: Sore Pain Intervention(s): Limited activity within patient's tolerance;Monitored during session;Premedicated before session;Ice applied    Home Living Family/patient expects to be discharged to:: Private residence Living Arrangements: Spouse/significant other;Children Available Help at Discharge: Family;Available 24 hours/day Type of Home: House Home Access: Stairs to enter Entrance Stairs-Rails: None Entrance Stairs-Number of Steps: 2 Home Layout: One level Home Equipment: Grab bars - tub/shower;Grab bars - toilet;Walker - 2 wheels Additional Comments: wife is visually impaired, 63 y.o. 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        Extremity/Trunk Assessment   Upper Extremity Assessment: Overall WFL for tasks assessed           Lower Extremity Assessment: RLE deficits/detail;LLE deficits/detail RLE Deficits / Details: 3/5 SLR, knee flexion 45* AAROM, ankle WNL LLE Deficits / Details: 3/5 SLR, knee flexion 40* AAROM, ankle WNL  Cervical / Trunk Assessment: Normal  Communication   Communication: No difficulties  Cognition Arousal/Alertness: Awake/alert Behavior During Therapy: WFL for tasks assessed/performed Overall Cognitive Status: Within Functional Limits for tasks assessed  General Comments      Exercises Total Joint Exercises Ankle Circles/Pumps: AROM;Both;20 reps;Supine Quad Sets: AROM;Both;10 reps;Supine Short Arc Quad: AROM;Both;10 reps;Supine Heel Slides: AAROM;Both;10 reps;Supine Hip ABduction/ADduction: AAROM;Both;10 reps;Supine Goniometric ROM: L knee 0-40* AAROM, R knee 0-45* AAROM      Assessment/Plan    PT Assessment Patient needs  continued PT services  PT Diagnosis Difficulty walking;Acute pain   PT Problem List Decreased strength;Decreased range of motion;Decreased activity tolerance;Pain;Decreased knowledge of use of DME;Decreased mobility  PT Treatment Interventions DME instruction;Gait training;Stair training;Functional mobility training;Therapeutic activities;Patient/family education;Therapeutic exercise   PT Goals (Current goals can be found in the Care Plan section) Acute Rehab PT Goals 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 PT Goal Formulation: With patient Time For Goal Achievement: 09/21/15 Potential to Achieve Goals: Good    Frequency 7X/week   Barriers to discharge        Co-evaluation               End of Session Equipment Utilized During Treatment: Gait belt Activity Tolerance: Patient tolerated treatment well Patient left: in chair;with call bell/phone within reach Nurse Communication: Mobility status         Time: [id number] PT Time Calculation (min) (ACUTE ONLY): 40 min   Charges:   PT Evaluation $PT Eval Low Complexity: 1 Procedure PT Treatments $Gait Training: 8-22 mins $Therapeutic Exercise: 8-22 mins   PT G Codes:        Philomena Doheny 09/14/2015, 9:51 AM [phone]

## 2015-09-14 NOTE — Progress Notes (Signed)
Physical Therapy Treatment Patient Details Name: Thomas [name] MRN: [id number] DOB: [birth year] Today's Date: 09/14/2015    History of Present Illness B TKA    PT Comments    Pt progressing very well with mobility, he ambulated 240' with RW and performed B TKA exercises with min A. Anticipate he'll be ready to DC home tomorrow.   Follow Up Recommendations  Outpatient PT     Equipment Recommendations  None recommended by PT    Recommendations for Other Services       Precautions / Restrictions Precautions Precautions: Knee;Fall Precaution Comments: no h/o falls Restrictions Weight Bearing Restrictions: No Other Position/Activity Restrictions: WBAT BLEs    Mobility  Bed Mobility Overal bed mobility: Needs Assistance Bed Mobility: Supine to Sit     Supine to sit: Min assist;HOB elevated Sit to supine: Modified independent (Device/Increase time)   General bed mobility comments: pt self assisted BLEs into bed with BUEs  Transfers Overall transfer level: Needs assistance Equipment used: Rolling walker (2 wheeled) Transfers: Sit to/from Stand Sit to Stand: Min guard         General transfer comment: no physical assist to rise from recliner, verbal cues for BLE management  Ambulation/Gait Ambulation/Gait assistance: Supervision Ambulation Distance (Feet): 240 Feet Assistive device: Rolling walker (2 wheeled) Gait Pattern/deviations: Step-to pattern;Wide base of support;Decreased step length - right;Decreased step length - left   Gait velocity interpretation: Below normal speed for age/gender General Gait Details: steady with RW, no LOB, VCs to lift head, decr knee flexion B   Stairs            Wheelchair Mobility    Modified Rankin (Stroke Patients Only)       Balance Overall balance assessment: Modified Independent                                  Cognition Arousal/Alertness: Awake/alert Behavior During Therapy: WFL for tasks  assessed/performed Overall Cognitive Status: Within Functional Limits for tasks assessed                      Exercises Total Joint Exercises Ankle Circles/Pumps: AROM;Both;20 reps;Supine Quad Sets: AROM;Both;10 reps;Supine Short Arc Quad: AROM;Both;10 reps;Supine Heel Slides: AAROM;Both;10 reps;Supine Hip ABduction/ADduction: AAROM;Both;10 reps;Supine Straight Leg Raises: AROM;Both;10 reps;Supine Goniometric ROM: L knee 0-40* AAROM, R knee 0-45* AAROM    General Comments        Pertinent Vitals/Pain Pain Assessment: 0-10 Pain Score: 5  Pain Location: B knees Pain Descriptors / Indicators: Sore Pain Intervention(s): Limited activity within patient's tolerance;Monitored during session;Premedicated before session;Ice applied    Home Living                      Prior Function            PT Goals (current goals can now be found in the care plan section) Acute Rehab PT Goals Patient Stated Goal: return to work as [employer], walk on [employer] PT Goal Formulation: With patient Time For Goal Achievement: 09/21/15 Potential to Achieve Goals: Good Progress towards PT goals: Progressing toward goals    Frequency  7X/week    PT Plan Current plan remains appropriate    Co-evaluation             End of Session Equipment Utilized During Treatment: Gait belt Activity Tolerance: Patient tolerated treatment well Patient left: with call bell/phone within reach;in bed;with  family/visitor present     Time: [id number] PT Time Calculation (min) (ACUTE ONLY): 37 min  Charges:  $Gait Training: 8-22 mins $Therapeutic Exercise: 8-22 mins                    G Codes:      Philomena Doheny 09/14/2015, 1:26 PM [phone]

## 2015-09-14 NOTE — Discharge Instructions (Addendum)
Information on my medicine - XARELTO (Rivaroxaban)  This medication education was reviewed with me or my healthcare representative as part of my discharge preparation.  The pharmacist that spoke with me during my hospital stay was:  Hershal Coria, [facility]  Why was Xarelto prescribed for you? Xarelto was prescribed for you to reduce the risk of blood clots forming after orthopedic surgery. The medical term for these abnormal blood clots is venous thromboembolism (VTE).  What do you need to know about xarelto ? Take your Xarelto ONCE DAILY at the same time every day. You may take it either with or without food.  If you have difficulty swallowing the tablet whole, you may crush it and mix in applesauce just prior to taking your dose.  Take Xarelto exactly as prescribed by your doctor and DO NOT stop taking Xarelto without talking to the doctor who prescribed the medication.  Stopping without other VTE prevention medication to take the place of Xarelto may increase your risk of developing a clot.  After discharge, you should have regular check-up appointments with your healthcare provider that is prescribing your Xarelto.    What do you do if you miss a dose? If you miss a dose, take it as soon as you remember on the same day then continue your regularly scheduled once daily regimen the next day. Do not take two doses of Xarelto on the same day.   Important Safety Information A possible side effect of Xarelto is bleeding. You should call your healthcare provider right away if you experience any of the following: ? Bleeding from an injury or your nose that does not stop. ? Unusual colored urine (red or dark brown) or unusual colored stools (red or black). ? Unusual bruising for unknown reasons. ? A serious fall or if you hit your head (even if there is no bleeding).  Some medicines may interact with Xarelto and might increase your risk of bleeding while on Xarelto. To help avoid this,  consult your healthcare provider or pharmacist prior to using any new prescription or non-prescription medications, including herbals, vitamins, non-steroidal anti-inflammatory drugs (NSAIDs) and supplements.  This website has more information on Xarelto: [email or web address].[email or web address]   __________________________________________________________________________________________________________________________________   INSTRUCTIONS AFTER JOINT REPLACEMENT   o Remove items at home which could result in a fall. This includes throw rugs or furniture in walking pathways o ICE to the affected joint every three hours while awake for 30 minutes at a time, for at least the first 3-5 days, and then as needed for pain and swelling.  Continue to use ice for pain and swelling. You may notice swelling that will progress down to the foot and ankle.  This is normal after surgery.  Elevate your leg when you are not up walking on it.   o Continue to use the breathing machine you got in the hospital (incentive spirometer) which will help keep your temperature down.  It is common for your temperature to cycle up and down following surgery, especially at night when you are not up moving around and exerting yourself.  The breathing machine keeps your lungs expanded and your temperature down.   DIET:  As you were doing prior to hospitalization, we recommend a well-balanced diet.  DRESSING / WOUND CARE / SHOWERING  Keep the surgical dressing until follow up.  The dressing is water proof, so you can shower without any extra covering.  IF THE DRESSING FALLS OFF or the wound gets wet inside, change the dressing with  sterile gauze.  Please use good hand washing techniques before changing the dressing.  Do not use any lotions or creams on the incision until instructed by your surgeon.    ACTIVITY  o Increase activity slowly as tolerated, but follow the weight bearing instructions below.   o No driving for 6 weeks or until further  direction given by your physician.  You cannot drive while taking narcotics.  o No lifting or carrying greater than 10 lbs. until further directed by your surgeon. o Avoid periods of inactivity such as sitting longer than an hour when not asleep. This helps prevent blood clots.  o You may return to work once you are authorized by your doctor.     WEIGHT BEARING   Weight bearing as tolerated with assist device (walker, cane, etc) as directed, use it as long as suggested by your surgeon or therapist, typically at least 4-6 weeks.   EXERCISES  Results after joint replacement surgery are often greatly improved when you follow the exercise, range of motion and muscle strengthening exercises prescribed by your doctor. Safety measures are also important to protect the joint from further injury. Any time any of these exercises cause you to have increased pain or swelling, decrease what you are doing until you are comfortable again and then slowly increase them. If you have problems or questions, call your caregiver or physical therapist for advice.   Rehabilitation is important following a joint replacement. After just a few days of immobilization, the muscles of the leg can become weakened and shrink (atrophy).  These exercises are designed to build up the tone and strength of the thigh and leg muscles and to improve motion. Often times heat used for twenty to thirty minutes before working out will loosen up your tissues and help with improving the range of motion but do not use heat for the first two weeks following surgery (sometimes heat can increase post-operative swelling).   These exercises can be done on a training (exercise) mat, on the floor, on a table or on a bed. Use whatever works the best and is most comfortable for you.    Use music or television while you are exercising so that the exercises are a pleasant break in your day. This will make your life better with the exercises acting as a  break in your routine that you can look forward to.   Perform all exercises about fifteen times, three times per day or as directed.  You should exercise both the operative leg and the other leg as well.  Exercises include:    Quad Sets - Tighten up the muscle on the front of the thigh (Quad) and hold for 5-10 seconds.    Straight Leg Raises - With your knee straight (if you were given a brace, keep it on), lift the leg to 60 degrees, hold for 3 seconds, and slowly lower the leg.  Perform this exercise against resistance later as your leg gets stronger.   Leg Slides: Lying on your back, slowly slide your foot toward your buttocks, bending your knee up off the floor (only go as far as is comfortable). Then slowly slide your foot back down until your leg is flat on the floor again.   Angel Wings: Lying on your back spread your legs to the side as far apart as you can without causing discomfort.   Hamstring Strength:  Lying on your back, push your heel against the floor with your leg straight by  tightening up the muscles of your buttocks.  Repeat, but this time bend your knee to a comfortable angle, and push your heel against the floor.  You may put a pillow under the heel to make it more comfortable if necessary.   A rehabilitation program following joint replacement surgery can speed recovery and prevent re-injury in the future due to weakened muscles. Contact your doctor or a physical therapist for more information on knee rehabilitation.    CONSTIPATION  Constipation is defined medically as fewer than three stools per week and severe constipation as less than one stool per week.  Even if you have a regular bowel pattern at home, your normal regimen is likely to be disrupted due to multiple reasons following surgery.  Combination of anesthesia, postoperative narcotics, change in appetite and fluid intake all can affect your bowels.   YOU MUST use at least one of the following options; they are  listed in order of increasing strength to get the job done.  They are all available over the counter, and you may need to use some, POSSIBLY even all of these options:    Drink plenty of fluids (prune juice may be helpful) and high fiber foods Colace 100 mg by mouth twice a day  Senokot for constipation as directed and as needed Dulcolax (bisacodyl), take with full glass of water  Miralax (polyethylene glycol) once or twice a day as needed.  If you have tried all these things and are unable to have a bowel movement in the first 3-4 days after surgery call either your surgeon or your primary doctor.    If you experience loose stools or diarrhea, hold the medications until you stool forms back up.  If your symptoms do not get better within 1 week or if they get worse, check with your doctor.  If you experience "the worst abdominal pain ever" or develop nausea or vomiting, please contact the office immediately for further recommendations for treatment.   ITCHING:  If you experience itching with your medications, try taking only a single pain pill, or even half a pain pill at a time.  You can also use Benadryl over the counter for itching or also to help with sleep.   TED HOSE STOCKINGS:  Use stockings on both legs until for at least 2 weeks or as directed by physician office. They may be removed at night for sleeping.  MEDICATIONS:  See your medication summary on the After Visit Summary that nursing will review with you.  You may have some home medications which will be placed on hold until you complete the course of blood thinner medication.  It is important for you to complete the blood thinner medication as prescribed.  PRECAUTIONS:  If you experience chest pain or shortness of breath - call 911 immediately for transfer to the hospital emergency department.   If you develop a fever greater that 101 F, purulent drainage from wound, increased redness or drainage from wound, foul odor from the  wound/dressing, or calf pain - CONTACT YOUR SURGEON.                                                   FOLLOW-UP APPOINTMENTS:  If you do not already have a post-op appointment, please call the office for an appointment to be seen by your surgeon.  Guidelines for how soon to be seen are listed in your After Visit Summary, but are typically between 1-4 weeks after surgery.  OTHER INSTRUCTIONS:   Knee Replacement:  Do not place pillow under knee, focus on keeping the knee straight while resting.   MAKE SURE YOU:   Understand these instructions.   Get help right away if you are not doing well or get worse.    Thank you for letting us be a part of your medical care team.  It is a privilege we respect greatly.  We hope these instructions will help you stay on track for a fast and full recovery!

## 2015-09-14 NOTE — Evaluation (Signed)
Occupational Therapy Evaluation Patient Details Name: Thomas [name] MRN: [id number] DOB: [birth year] Today's Date: 09/14/2015    History of Present Illness B TKA   Clinical Impression   Patient evaluated by Occupational Therapy with no further acute OT needs identified. All education has been completed and the patient has no further questions.Pt is able to perform ADLs at supervision level and should quickly progress to modified independence.  Thomas is very motivated.  Education completed. See below for any follow-up Occupational Therapy or equipment needs. OT is signing off. Thank you for this referral.      Follow Up Recommendations  No OT follow up;Supervision/Assistance - 24 hour    Equipment Recommendations  None recommended by OT    Recommendations for Other Services       Precautions / Restrictions Precautions Precautions: Knee;Fall Precaution Comments: no h/o falls Restrictions Weight Bearing Restrictions: No Other Position/Activity Restrictions: WBAT BLEs      Mobility Bed Mobility Overal bed mobility: Needs Assistance         Sit to supine: Modified independent (Device/Increase time)   General bed mobility comments: pt self assisted BLEs into bed with BUEs  Transfers Overall transfer level: Needs assistance Equipment used: Rolling walker (2 wheeled) Transfers: Sit to/from [employer] Sit to Stand: Supervision Stand pivot transfers: Supervision       General transfer comment: no physical assist to rise from recliner, verbal cues for BLE management    Balance Overall balance assessment: Modified Independent                                          ADL Overall ADL's : Needs assistance/impaired Eating/Feeding: Independent   Grooming: Wash/dry hands;Wash/dry face;Oral care;Brushing hair;Supervision/safety;Standing   Upper Body Bathing: Set up;Sitting   Lower Body Bathing: Supervison/ safety;Sit to/from stand    Upper Body Dressing : Set up;Sitting   Lower Body Dressing: Supervision/safety;Sit to/from stand   Toilet Transfer: Supervision/safety;Ambulation;Regular Toilet;BSC;Grab bars;RW   Toileting- Clothing Manipulation and Hygiene: Supervision/safety;Sit to/from stand   Tub/ Shower Transfer: Tub transfer;Min guard;Ambulation;Grab bars;Rolling walker Tub/Shower Transfer Details (indicate cue type and reason): Simulated transfer.  Pt able to perform safely with min gaurd assist.  Functional mobility during ADLs: Supervision/safety;Rolling walker       Vision     Perception     Praxis      Pertinent Vitals/Pain Pain Assessment: 0-10 Pain Score: 4  Pain Location: bil knees  Pain Descriptors / Indicators: Aching Pain Intervention(s): Monitored during session;Premedicated before session;Ice applied     Hand Dominance Right   Extremity/Trunk Assessment Upper Extremity Assessment Upper Extremity Assessment: Overall WFL for tasks assessed   Lower Extremity Assessment Lower Extremity Assessment: Defer to PT evaluation   Cervical / Trunk Assessment Cervical / Trunk Assessment: Normal   Communication Communication Communication: No difficulties   Cognition Arousal/Alertness: Awake/alert Behavior During Therapy: WFL for tasks assessed/performed Overall Cognitive Status: Within Functional Limits for tasks assessed                     General Comments       Exercises       Shoulder Instructions      Home Living Family/patient expects to be discharged to:: Private residence Living Arrangements: Spouse/significant other;Children Available Help at Discharge: Family;Available 24 hours/day Type of Home: House Home Access: Stairs to enter [employer] of Steps: 2 Entrance  Stairs-Rails: None Home Layout: One level     Bathroom Shower/Tub: Teacher, early years/pre: Standard Bathroom Accessibility: Yes How Accessible: Accessible via walker Home  Equipment: Grab bars - tub/shower;Grab bars - toilet;Walker - 2 wheels   Additional Comments: wife is visually impaired, 63 y.o. 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    OT Diagnosis: Generalized weakness;Acute pain   OT Problem List: Pain   OT Treatment/Interventions:      OT Goals(Current goals can be found in the care plan section) Acute Rehab OT Goals Patient Stated Goal: return to work as stone [employer], walk on [employer] OT Goal Formulation: All assessment and education complete, DC therapy  OT Frequency:     Barriers to D/C:            Co-evaluation              End of Session Nurse Communication: Mobility status  Activity Tolerance: Patient tolerated treatment well Patient left: in chair;with call bell/phone within reach;with chair alarm set;with family/visitor present   Time: [id number] OT Time Calculation (min): 27 min Charges:  OT General Charges $OT Visit: 1 Procedure OT Evaluation $OT Eval Low Complexity: 1 Procedure OT Treatments $Self Care/Home Management : 8-22 mins G-Codes:    Peighton Mehra M October 10, 2015, 3:14 PM

## 2015-09-15 DIAGNOSIS — E663 Overweight: Secondary | ICD-10-CM | POA: Diagnosis present

## 2015-09-15 LAB — BASIC METABOLIC PANEL
Anion gap: 5 (ref 5–15)
BUN: 22 mg/dL — ABNORMAL HIGH (ref 6–20)
CO2: 29 mmol/L (ref 22–32)
Calcium: 8.1 mg/dL — ABNORMAL LOW (ref 8.9–10.3)
Chloride: 101 mmol/L (ref 101–111)
Creatinine, Ser: 0.79 mg/dL (ref 0.61–1.24)
GFR calc Af Amer: >60 mL/min (ref >60)
GFR calc non Af Amer: >60 mL/min (ref >60)
Glucose, Bld: 111 mg/dL — ABNORMAL HIGH (ref 65–99)
Potassium: 4 mmol/L (ref 3.5–5.1)
Sodium: 135 mmol/L (ref 135–145)

## 2015-09-15 LAB — CBC
HCT: 29.7 % — ABNORMAL LOW (ref 39.0–52.0)
Hemoglobin: 10.1 g/dL — ABNORMAL LOW (ref 13.0–17.0)
MCH: 29.8 pg (ref 26.0–34.0)
MCHC: 34 g/dL (ref 30.0–36.0)
MCV: 87.6 fL (ref 78.0–100.0)
Platelets: 195 10*3/uL (ref 150–400)
RBC: 3.39 MIL/uL — ABNORMAL LOW (ref 4.22–5.81)
RDW: 13.3 % (ref 11.5–15.5)
WBC: 7.1 10*3/uL (ref 4.0–10.5)

## 2015-09-15 NOTE — Progress Notes (Signed)
     Subjective: 2 Days Post-Op Procedure(s) (LRB): BILATERAL KNEE ARTHROPLASTY  (Bilateral)   Patient reports pain as mild, pain controlled.  No events throughout the night.  Doing well with PT.  Ready to be discharged home.  Objective:   VITALS:   Filed Vitals:   09/14/15 2155 09/15/15 0531  BP: 112/50 121/58  Pulse: 86 83  Temp: 98.4 F (36.9 C) 98.4 F (36.9 C)  Resp: 18     Dorsiflexion/Plantar flexion intact Incision: dressing C/D/I No cellulitis present Compartment soft  LABS  Recent Labs  09/14/15 0427 09/15/15 0430  HGB 12.3* 10.1*  HCT 35.8* 29.7*  WBC 11.9* 7.1  PLT 227 195     Recent Labs  09/14/15 0428 09/15/15 0430  NA 137 135  K 4.8 4.0  BUN 21* 22*  CREATININE 0.79 0.79  GLUCOSE 140* 111*     Assessment/Plan: 2 Days Post-Op Procedure(s) (LRB): BILATERAL KNEE ARTHROPLASTY  (Bilateral) Up with therapy Discharge home with home health  Follow up in 2 weeks at [facility]. Follow up with OLIN,Harald Quevedo D in 2 weeks.  Contact information:  [facility] [address] [place] [id number]    Overweight (BMI 25-29.9) Estimated body mass index is 26.8 kg/(m^2) as calculated from the following:   Height as of this encounter: 6\' 8"  (2.032 m).   Weight as of this encounter: 110.678 kg (244 lb). Patient also counseled that weight may inhibit the healing process Patient counseled that losing weight will help with future health issues         West Pugh. Lynda Wanninger   PAC  09/15/2015, 9:35 AM

## 2015-09-15 NOTE — Progress Notes (Signed)
     Subjective: 1 Day Post-Op Procedure(s) (LRB): BILATERAL KNEE ARTHROPLASTY  (Bilateral)   Seen by Dr. Alvan Dame. Patient reports pain as mild, pain well controlled. No events throughout the night.  Already progressing well with PT.     Objective:   VITALS:    09/14/15   0658  BP: 134/77  Pulse: 75  Temp: 98.7 F (37.1 C)  Resp: 18    Dorsiflexion/Plantar flexion intact Incision: dressing C/D/I No cellulitis present Compartment soft  LABS  Recent Labs  09/14/15 0427  HGB 12.3*  HCT 35.8*  WBC 11.9*  PLT 227     Recent Labs  09/14/15 0428  NA 137  K 4.8  BUN 21*  CREATININE 0.79  GLUCOSE 140*     Assessment/Plan: 1 Day Post-Op Procedure(s) (LRB): BILATERAL KNEE ARTHROPLASTY  (Bilateral) Foley cath d/c'ed Advance diet Up with therapy D/C IV fluids Discharge home with home health, if he does well with PT  Overweight (BMI 25-29.9) Estimated body mass index is 26.8 kg/(m^2) as calculated from the following:   Height as of this encounter: 6\' 8"  (2.032 m).   Weight as of this encounter: 110.678 kg (244 lb). Patient also counseled that weight may inhibit the healing process Patient counseled that losing weight will help with future health issues      Thomas [name]   PAC  09/15/2015, 9:38 AM

## 2015-09-15 NOTE — Progress Notes (Signed)
Physical Therapy Treatment Patient Details Name: ULUS POTTORF MRN: [id number] DOB: 1950-06-23 Today's Date: 09/15/2015    History of Present Illness B TKA    PT Comments    Assisted OOB to amb in hallway then back to bed to perform TE's.  Follow Up Recommendations  Outpatient PT     Equipment Recommendations       Recommendations for Other Services       Precautions / Restrictions Restrictions Weight Bearing Restrictions: No    Mobility  Bed Mobility Overal bed mobility: Needs Assistance Bed Mobility: Supine to Sit     Supine to sit: Supervision     General bed mobility comments: pt able to lift B LE's   Transfers Overall transfer level: Needs assistance Equipment used: Rolling walker (2 wheeled) Transfers: Sit to/from Stand Sit to Stand: Supervision         General transfer comment: good safety cognition and use of hands  Ambulation/Gait Ambulation/Gait assistance: Supervision Ambulation Distance (Feet): 142 Feet Assistive device: Rolling walker (2 wheeled) Gait Pattern/deviations: Step-to pattern;Decreased step length - right;Decreased step length - left;Wide base of support Gait velocity: decreased   General Gait Details: steady with RW, no LOB, VCs to lift head, decr knee flexion B   Stairs            Wheelchair Mobility    Modified Rankin (Stroke Patients Only)       Balance                                    Cognition Arousal/Alertness: Awake/alert Behavior During Therapy: WFL for tasks assessed/performed Overall Cognitive Status: Within Functional Limits for tasks assessed                      Exercises      General Comments        Pertinent Vitals/Pain Pain Assessment: 0-10 Pain Score: 5  Pain Location: B knee Pain Descriptors / Indicators: Grimacing;Sore Pain Intervention(s): Monitored during session;Premedicated before session;Repositioned;Ice applied    Home Living                       Prior Function            PT Goals (current goals can now be found in the care plan section) Progress towards PT goals: Progressing toward goals    Frequency  7X/week    PT Plan Current plan remains appropriate    Co-evaluation             End of Session Equipment Utilized During Treatment: Gait belt Activity Tolerance: Patient tolerated treatment well Patient left: in bed;with call bell/phone within reach;with family/visitor present     Time: [id number] PT Time Calculation (min) (ACUTE ONLY): 32 min  Charges:  $Gait Training: 8-22 mins $Therapeutic Exercise: 8-22 mins                    G Codes:      Rica Koyanagi  PTA [facility]  Acute  Rehab Pager      [phone]

## 2015-09-21 NOTE — Discharge Summary (Signed)
Physician Discharge Summary  Patient ID: Thomas [name] MRN: [id number] DOB/AGE: [birth year] 63 y.o.  Admit date: 09/13/2015 Discharge date: 09/15/2015   Procedures:  Procedure(s) (LRB): BILATERAL KNEE ARTHROPLASTY  (Bilateral)  Attending Physician:  Dr. Paralee Cancel   Admission Diagnoses:   Bilaterally knee primary OA / pain  Discharge Diagnoses:  Principal Problem:   S/P bilateral TKA Active Problems:   Overweight (BMI 25.0-29.9)  Past Medical History  Diagnosis Date  . Left knee DJD     Xray 12/23/08  . Hypertension     HPI:    Thomas [name], 64 y.o. male, has a history of pain and functional disability in the bilaterally knee due to arthritis and has failed non-surgical conservative treatments for greater than 12 weeks to include NSAID's and/or analgesics, corticosteriod injections and activity modification. Onset of symptoms was gradual, starting 5-6 years ago with gradually worsening course since that time. The patient noted no past surgery on the bilaterally knee(s). Patient currently rates pain in the bilaterally knee(s) at 8 out of 10 with activity. Patient has night pain, worsening of pain with activity and weight bearing, pain that interferes with activities of daily living, pain with passive range of motion, crepitus and joint swelling. Patient has evidence of periarticular osteophytes and joint space narrowing by imaging studies. There is no active infection. Risks, benefits and expectations were discussed with the patient. Risks including but not limited to the risk of anesthesia, blood clots, nerve damage, blood vessel damage, failure of the prosthesis, infection and up to and including death. Patient understand the risks, benefits and expectations and wishes to proceed with surgery.   PCP: [name], MD   Discharged Condition: good  Hospital Course:  Patient underwent the above stated procedure on 09/13/2015. Patient tolerated the procedure well and  brought to the recovery room in good condition and subsequently to the floor.  POD #1 BP: 134/77 ; Pulse: 75 ; Temp: 98.7 F (37.1 C) ; Resp: 18 Patient reports pain as mild, pain well controlled. No events throughout the night. Already progressing well with PT. Dorsiflexion/plantar flexion intact, incisions: dressing C/D/I, no cellulitis present and compartments soft.   LABS  Basename    HGB     12.3  HCT     35.8   POD #2  BP: 121/58 ; Pulse: 83 ; Temp: 98.4 F (36.9 C) ; Resp: 18 Patient reports pain as mild, pain controlled. No events throughout the night. Doing well with PT. Ready to be discharged home. Dorsiflexion/plantar flexion intact, incisions: dressing C/D/I, no cellulitis present and compartments soft.   LABS  Basename    HGB     10.1  HCT     29.7    Discharge Exam: General appearance: alert, cooperative and no distress Extremities: Homans sign is negative, no sign of DVT, no edema, redness or tenderness in the calves or thighs and no ulcers, gangrene or trophic changes  Disposition: Home with follow up in 2 weeks   Follow-up Information    Follow up with Mauri Pole, MD. Schedule an appointment as soon as possible for a visit in 2 weeks.   Specialty:  Orthopedic Surgery   Contact information:   [address] [place] [address] [phone]       Follow up with Marchia Bond PA.   Specialty:  Specialist   Why:  call to make an appointment for outpt therapy   Contact information:   [address]  [place] [address] [phone]       Discharge Instructions    Call MD / Call 911    Complete by:  As directed   If you experience chest pain or shortness of breath, CALL 911 and be transported to the hospital emergency room.  If you develope a fever above 101 F, pus (white drainage) or increased drainage or redness at the wound, or calf pain, call your surgeon's office.     Change dressing    Complete by:   As directed   Maintain surgical dressing until follow up in the clinic. If the edges start to pull up, may reinforce with tape. If the dressing is no longer working, may remove and cover with gauze and tape, but must keep the area dry and clean.  Call with any questions or concerns.     Constipation Prevention    Complete by:  As directed   Drink plenty of fluids.  Prune juice may be helpful.  You may use a stool softener, such as Colace (over the counter) 100 mg twice a day.  Use MiraLax (over the counter) for constipation as needed.     Diet - low sodium heart healthy    Complete by:  As directed      Discharge instructions    Complete by:  As directed   Maintain surgical dressing until follow up in the clinic. If the edges start to pull up, may reinforce with tape. If the dressing is no longer working, may remove and cover with gauze and tape, but must keep the area dry and clean.  Follow up in 2 weeks at [facility]. Call with any questions or concerns.     Increase activity slowly as tolerated    Complete by:  As directed   Weight bearing as tolerated with assist device (walker, cane, etc) as directed, use it as long as suggested by your surgeon or therapist, typically at least 4-6 weeks.     TED hose    Complete by:  As directed   Use stockings (TED hose) for 2 weeks on both leg(s).  You may remove them at night for sleeping.             Medication List    STOP taking these medications        chlorthalidone 25 MG tablet  Commonly known as:  HYGROTON     diclofenac 75 MG EC tablet  Commonly known as:  VOLTAREN     HYDROcodone-acetaminophen 5-325 MG tablet  Commonly known as:  NORCO      TAKE these medications        aspirin EC 325 MG tablet  Take 1 tablet (325 mg total) by mouth 2 (two) times daily. Start the day after finishing the Xarelto. Take for 4 weeks.     celecoxib 200 MG capsule  Commonly known as:  CELEBREX  Take 1 capsule (200 mg total) by mouth  every 12 (twelve) hours.     cyclobenzaprine 10 MG tablet  Commonly known as:  FLEXERIL  Take 1 tablet (10 mg total) by mouth 3 (three) times daily as needed for muscle spasms.     docusate sodium 100 MG capsule  Commonly known as:  COLACE  Take 1 capsule (100 mg total) by mouth 2 (two) times daily.     ferrous sulfate 325 (65 FE) MG tablet  Take 1 tablet (325 mg total) by mouth 3 (three) times daily after meals.  lisinopril 40 MG tablet  Commonly known as:  PRINIVIL,ZESTRIL  TAKE 1 TABLET (40 MG TOTAL) BY MOUTH DAILY.     multivitamin with minerals Tabs tablet  Take 1 tablet by mouth daily.     oxyCODONE 5 MG immediate release tablet  Commonly known as:  Oxy IR/ROXICODONE  Take 1-3 tablets (5-15 mg total) by mouth every 4 (four) hours as needed for severe pain.     polyethylene glycol packet  Commonly known as:  MIRALAX / GLYCOLAX  Take 17 g by mouth 2 (two) times daily.     rivaroxaban 10 MG Tabs tablet  Commonly known as:  XARELTO  Take 1 tablet (10 mg total) by mouth daily.     SYRINGE 3CC/22GX1" 22G X 1" 3 ML Misc  1 Syringe by Does not apply route once a week. To use to administer testosterone injections     testosterone enanthate 200 MG/ML injection  Commonly known as:  DELATESTRYL  INJECT 0.5ML INTRAMUSCULARLY WEEKLY     VIAGRA 100 MG tablet  Generic drug:  sildenafil  TAKE ONE TABLET BY MOUTH AS NEEDED FOR ERECTILE DYSFUNCTION         Signed: West Pugh. Lorenz Donley   PA-C  09/21/2015, 4:25 PM

## 2015-09-27 ENCOUNTER — Emergency Department (HOSPITAL_COMMUNITY): Payer: BLUE CROSS/BLUE SHIELD

## 2015-09-27 ENCOUNTER — Emergency Department (HOSPITAL_COMMUNITY)
Admission: EM | Admit: 2015-09-27 | Discharge: 2015-09-27 | Disposition: A | Discharge status: Home / Self Care | Payer: BLUE CROSS/BLUE SHIELD | Attending: Emergency Medicine | Admitting: Emergency Medicine

## 2015-09-27 ENCOUNTER — Encounter (HOSPITAL_COMMUNITY): Payer: Self-pay | Admitting: Emergency Medicine

## 2015-09-27 DIAGNOSIS — Z7901 Long term (current) use of anticoagulants: Secondary | ICD-10-CM | POA: Insufficient documentation

## 2015-09-27 DIAGNOSIS — E860 Dehydration: Secondary | ICD-10-CM | POA: Diagnosis not present

## 2015-09-27 DIAGNOSIS — E861 Hypovolemia: Secondary | ICD-10-CM | POA: Diagnosis not present

## 2015-09-27 DIAGNOSIS — I10 Essential (primary) hypertension: Secondary | ICD-10-CM | POA: Insufficient documentation

## 2015-09-27 DIAGNOSIS — Z7982 Long term (current) use of aspirin: Secondary | ICD-10-CM | POA: Diagnosis not present

## 2015-09-27 DIAGNOSIS — I959 Hypotension, unspecified: Secondary | ICD-10-CM | POA: Diagnosis present

## 2015-09-27 DIAGNOSIS — Z96653 Presence of artificial knee joint, bilateral: Secondary | ICD-10-CM | POA: Insufficient documentation

## 2015-09-27 LAB — COMPREHENSIVE METABOLIC PANEL
ALT: 30 U/L (ref 17–63)
AST: 26 U/L (ref 15–41)
Albumin: 4.1 g/dL (ref 3.5–5.0)
Alkaline Phosphatase: 110 U/L (ref 38–126)
Anion gap: 9 (ref 5–15)
BUN: 31 mg/dL — ABNORMAL HIGH (ref 6–20)
CO2: 24 mmol/L (ref 22–32)
Calcium: 9.4 mg/dL (ref 8.9–10.3)
Chloride: 105 mmol/L (ref 101–111)
Creatinine, Ser: 1.16 mg/dL (ref 0.61–1.24)
GFR calc Af Amer: >60 mL/min (ref >60)
GFR calc non Af Amer: >60 mL/min (ref >60)
Glucose, Bld: 84 mg/dL (ref 65–99)
Potassium: 3.9 mmol/L (ref 3.5–5.1)
Sodium: 138 mmol/L (ref 135–145)
Total Bilirubin: 1.3 mg/dL — ABNORMAL HIGH (ref 0.3–1.2)
Total Protein: 7.6 g/dL (ref 6.5–8.1)

## 2015-09-27 LAB — CBC WITH DIFFERENTIAL/PLATELET
Basophils Absolute: 0 10*3/uL (ref 0.0–0.1)
Basophils Relative: 0 %
Eosinophils Absolute: 0.1 10*3/uL (ref 0.0–0.7)
Eosinophils Relative: 1 %
HCT: 34.8 % — ABNORMAL LOW (ref 39.0–52.0)
Hemoglobin: 11.2 g/dL — ABNORMAL LOW (ref 13.0–17.0)
Lymphocytes Relative: 10 %
Lymphs Abs: 0.9 10*3/uL (ref 0.7–4.0)
MCH: 28 pg (ref 26.0–34.0)
MCHC: 32.2 g/dL (ref 30.0–36.0)
MCV: 87 fL (ref 78.0–100.0)
Monocytes Absolute: 0.9 10*3/uL (ref 0.1–1.0)
Monocytes Relative: 9 %
Neutro Abs: 7.7 10*3/uL (ref 1.7–7.7)
Neutrophils Relative %: 80 %
Platelets: 683 10*3/uL — ABNORMAL HIGH (ref 150–400)
RBC: 4 MIL/uL — ABNORMAL LOW (ref 4.22–5.81)
RDW: 13.1 % (ref 11.5–15.5)
WBC: 9.6 10*3/uL (ref 4.0–10.5)

## 2015-09-27 LAB — I-STAT CG4 LACTIC ACID, ED
Lactic Acid, Venous: 2.78 mmol/L (ref 0.5–2.0)
Lactic Acid, Venous: 0.69 mmol/L (ref 0.5–2.0)

## 2015-09-27 LAB — URINALYSIS, ROUTINE W REFLEX MICROSCOPIC
Bilirubin Urine: NEGATIVE
Glucose, UA: NEGATIVE mg/dL
Hgb urine dipstick: NEGATIVE
Ketones, ur: NEGATIVE mg/dL
Leukocytes, UA: NEGATIVE
Nitrite: NEGATIVE
Protein, ur: NEGATIVE mg/dL
Specific Gravity, Urine: 1.022 (ref 1.005–1.030)
pH: 6 (ref 5.0–8.0)

## 2015-09-27 LAB — I-STAT TROPONIN, ED: Troponin i, poc: 0 ng/mL (ref 0.00–0.08)

## 2015-09-27 IMAGING — CR DG CHEST 2V
2 series · 2 of 2 positions shown · non-contrast
Comparison: [DATE]

CLINICAL DATA: Weakness, hypotension

EXAM:
CHEST  2 VIEW

[w chest pa]
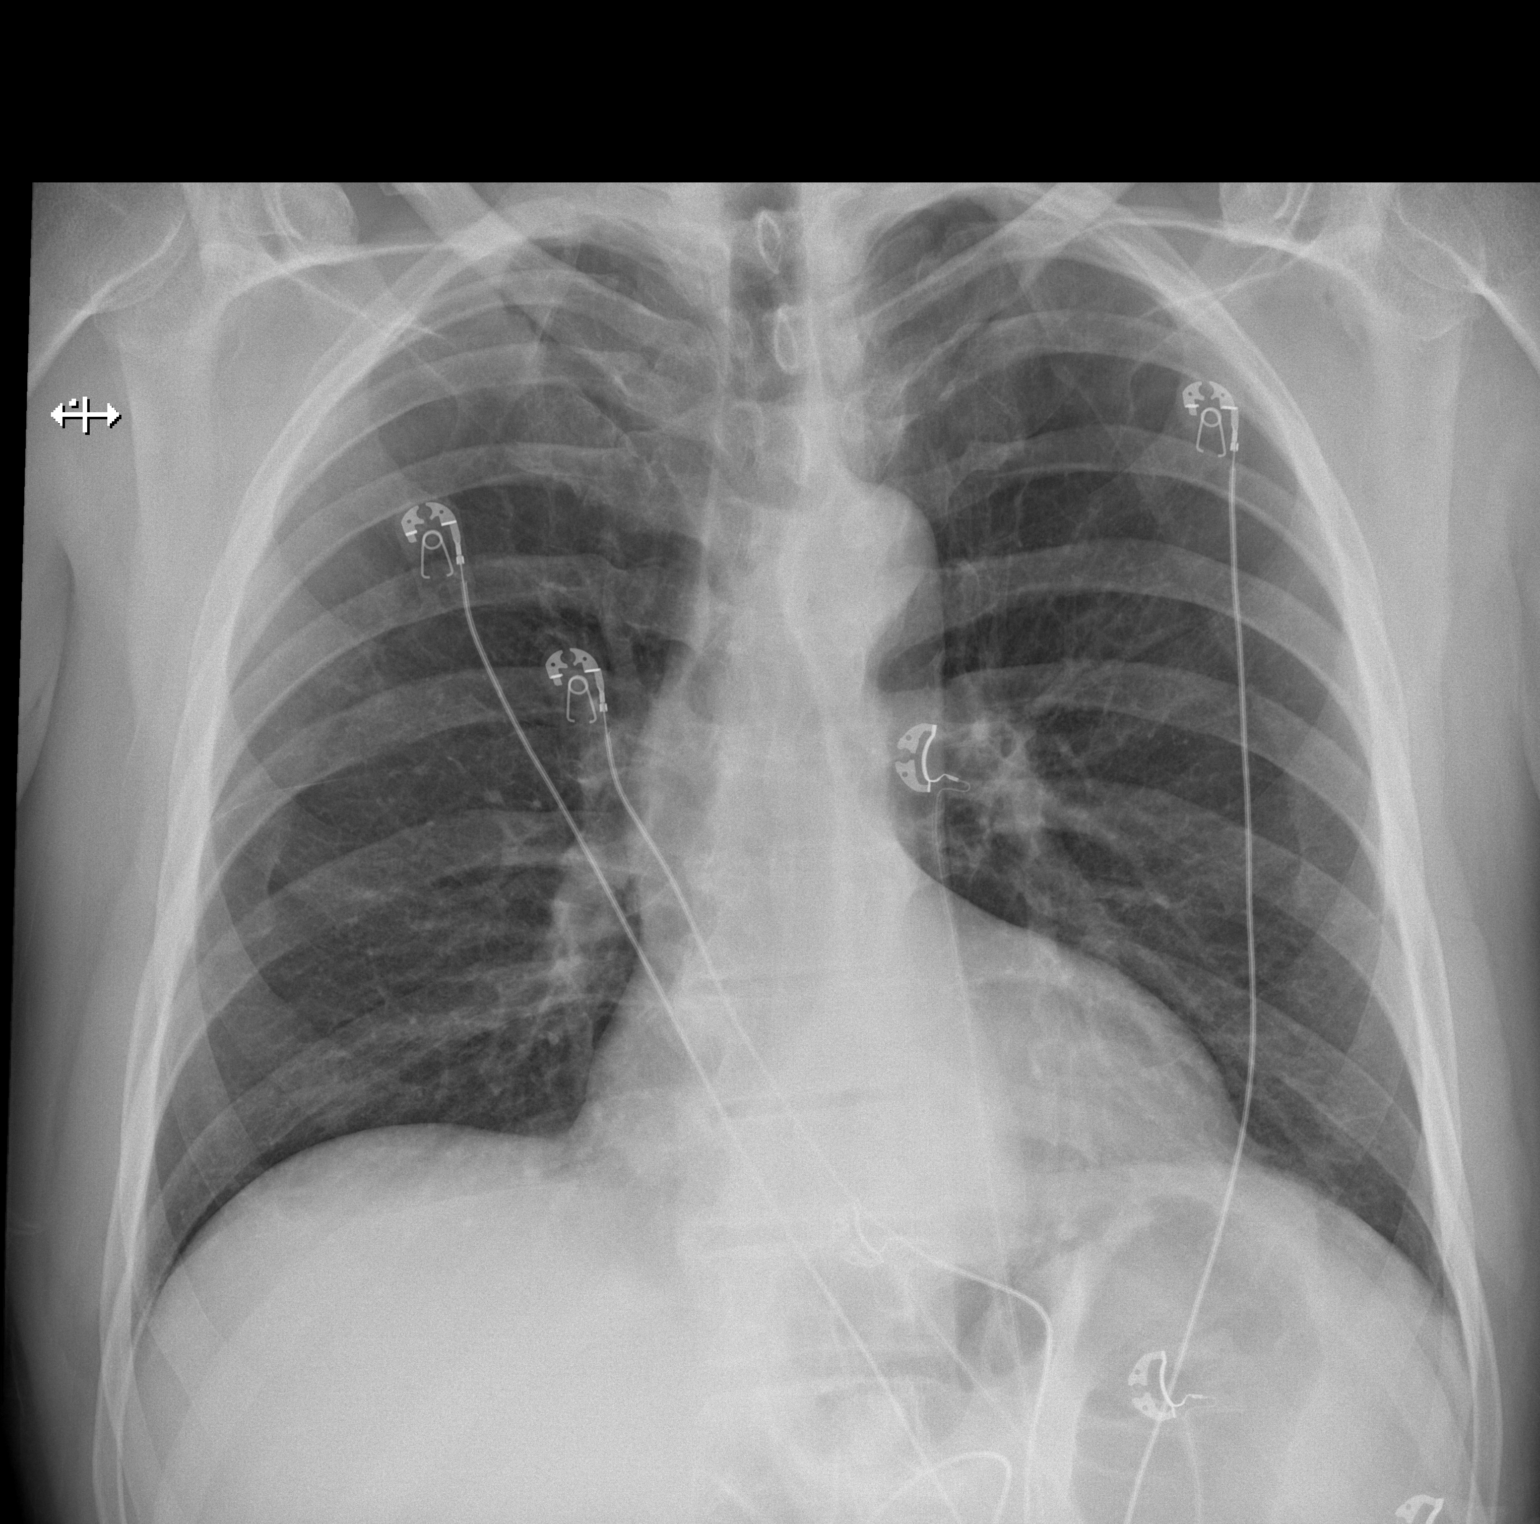

[w chest lat]
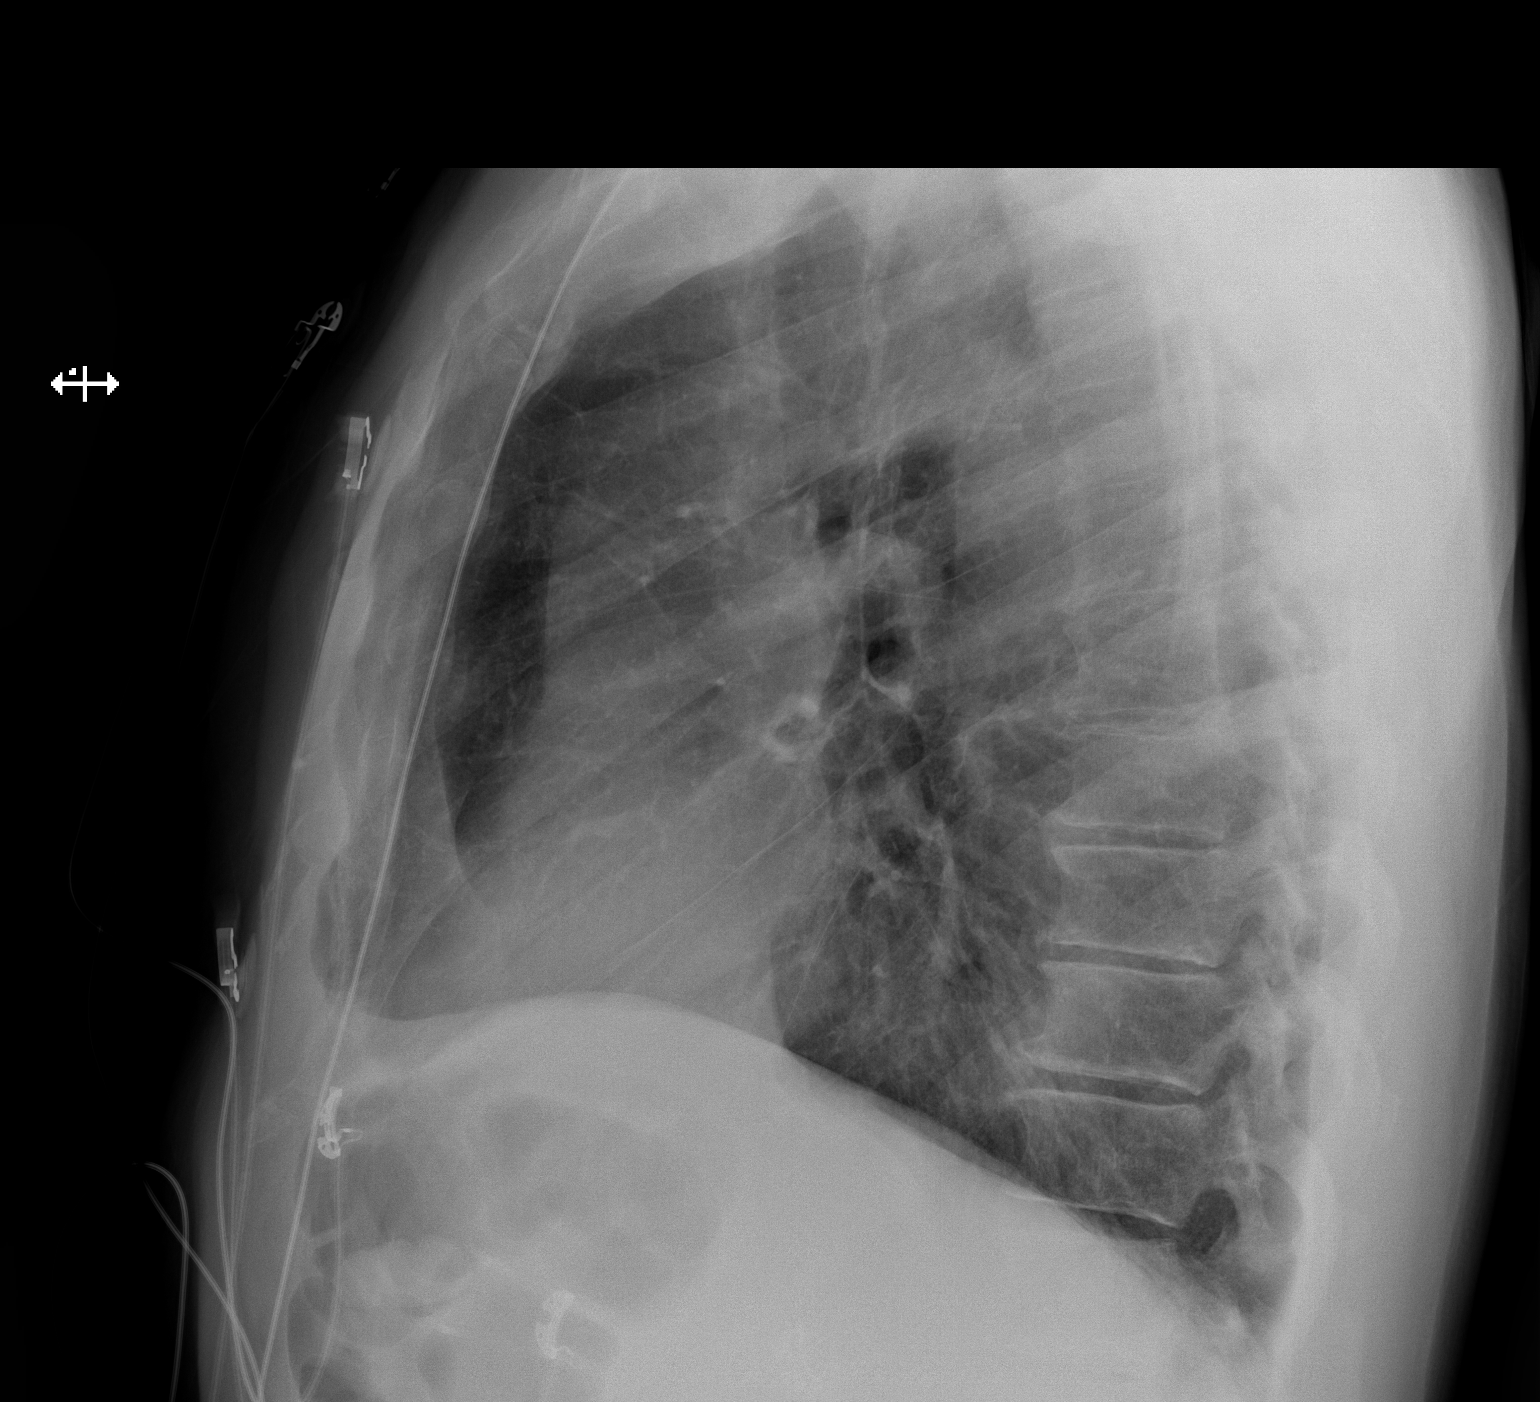

[2 of 2 positions shown; findings below may reference images not displayed]

FINDINGS: Heart and mediastinal contours are within normal limits. No focal
opacities or effusions. No acute bony abnormality.
IMPRESSION: No active cardiopulmonary disease.

## 2015-09-27 MED ORDER — SODIUM CHLORIDE 0.9 % IV BOLUS (SEPSIS)
1000.0000 mL | Freq: Once | INTRAVENOUS | Status: AC
  Administered 2015-09-27: 1000 mL via INTRAVENOUS

## 2015-09-27 NOTE — ED Notes (Signed)
Bed: [id number] Expected date:  Expected time:  Means of arrival:  Comments: Sherlyn Hay - hypotensive

## 2015-09-27 NOTE — Discharge Instructions (Signed)
Please drink plenty of fluids and keep well nourished. Measure your blood pressure before you take your blood pressure medications and hold your BP medications if your top blood pressure number is < 100-110. Return for worsening symptoms, including persistently low blood pressures at home, passing out, fever, chest pain or difficulty breathing, or any other symptoms concerning to you.  Dehydration, Adult Dehydration is a condition in which you do not have enough fluid or water in your body. It happens when you take in less fluid than you lose. Vital organs such as the kidneys, brain, and heart cannot function without a proper amount of fluids. Any loss of fluids from the body can cause dehydration.  Dehydration can range from mild to severe. This condition should be treated right away to help prevent it from becoming severe. CAUSES  This condition may be caused by:  Vomiting.  Diarrhea.  Excessive sweating, such as when exercising in hot or humid weather.  Not drinking enough fluid during strenuous exercise or during an illness.  Excessive urine output.  Fever.  Certain medicines. RISK FACTORS This condition is more likely to develop in:  People who are taking certain medicines that cause the body to lose excess fluid (diuretics).   People who have a chronic illness, such as diabetes, that may increase urination.  Older adults.   People who live at high altitudes.   People who participate in endurance sports.  SYMPTOMS  Mild Dehydration  Thirst.  Dry lips.  Slightly dry mouth.  Dry, warm skin. Moderate Dehydration  Very dry mouth.   Muscle cramps.   Dark urine and decreased urine production.   Decreased tear production.   Headache.   Light-headedness, especially when you stand up from a sitting position.  Severe Dehydration  Changes in skin.   Cold and clammy skin.   Skin does not spring back quickly when lightly pinched and released.    Changes in body fluids.   Extreme thirst.   No tears.   Not able to sweat when body temperature is high, such as in hot weather.   Minimal urine production.   Changes in vital signs.   Rapid, weak pulse (more than 100 beats per minute when you are sitting still).   Rapid breathing.   Low blood pressure.   Other changes.   Sunken eyes.   Cold hands and feet.   Confusion.  Lethargy and difficulty being awakened.  Fainting (syncope).   Short-term weight loss.   Unconsciousness. DIAGNOSIS  This condition may be diagnosed based on your symptoms. You may also have tests to determine how severe your dehydration is. These tests may include:   Urine tests.   Blood tests.  TREATMENT  Treatment for this condition depends on the severity. Mild or moderate dehydration can often be treated at home. Treatment should be started right away. Do not wait until dehydration becomes severe. Severe dehydration needs to be treated at the hospital. Treatment for Mild Dehydration  Drinking plenty of water to replace the fluid you have lost.   Replacing minerals in your blood (electrolytes) that you may have lost.  Treatment for Moderate Dehydration  Consuming oral rehydration solution (ORS). Treatment for Severe Dehydration  Receiving fluid through an IV tube.   Receiving electrolyte solution through a feeding tube that is passed through your nose and into your stomach (nasogastric tube or NG tube).  Correcting any abnormalities in electrolytes. HOME CARE INSTRUCTIONS   Drink enough fluid to keep your urine clear  or pale yellow.   Drink water or fluid slowly by taking small sips. You can also try sucking on ice cubes.  Have food or beverages that contain electrolytes. Examples include bananas and sports drinks.  Take over-the-counter and prescription medicines only as told by your health care provider.   Prepare ORS according to the manufacturer's  instructions. Take sips of ORS every 5 minutes until your urine returns to normal.  If you have vomiting or diarrhea, continue to try to drink water, ORS, or both.   If you have diarrhea, avoid:   Beverages that contain caffeine.   Fruit juice.   Milk.   Carbonated soft drinks.  Do not take salt tablets. This can lead to the condition of having too much sodium in your body (hypernatremia).  SEEK MEDICAL CARE IF:  You cannot eat or drink without vomiting.  You have had moderate diarrhea during a period of more than 24 hours.  You have a fever. SEEK IMMEDIATE MEDICAL CARE IF:   You have extreme thirst.  You have severe diarrhea.  You have not urinated in 6-8 hours, or you have urinated only a small amount of very dark urine.  You have shriveled skin.  You are dizzy, confused, or both.   This information is not intended to replace advice given to you by your health care provider. Make sure you discuss any questions you have with your health care provider.   Document Released: 04/10/2005 Document Revised: 12/30/2014 Document Reviewed: 08/26/2014 Elsevier Interactive Patient Education [employer].

## 2015-09-27 NOTE — ED Provider Notes (Signed)
CSN: [id number]     Arrival date & time 09/27/15  0849 History   First MD Initiated Contact with Patient 09/27/15 [phone]     Chief Complaint  Patient presents with  . Hypotension  . Weakness     (Consider location/radiation/quality/duration/timing/severity/associated sxs/prior Treatment) HPI 65 year old male who presents with hypotension and generalized weakness. He has a history of hypertension and is status post bilateral knee arthroplasty by Dr. Alvan Dame on 09/13/2015. He states that over these past 2 weeks he has been having difficulty ambulating due to his surgery and had not wanted to eat or drink anything to prevent himself from going to the bathroom. States that he had follow-up with Dr. Alvan Dame today, was noted to have hypotension with systolic blood pressure in the 80s. He was sent to the ED for evaluation. He does recall feeling more weak, mildly short of breath, and fatigued with activity. He occasionally feels lightheaded when he stands up from bed. He denies any chest pain, syncope, fevers or chills, cough, abdominal pain, nausea or vomiting, diarrhea, severe back pain, melena or hematochezia. Denies any urinary complaints. Past Medical History  Diagnosis Date  . Left knee DJD     Xray 12/23/08  . Hypertension    Past Surgical History  Procedure Laterality Date  . Hernia repair    . Total knee arthroplasty Bilateral 09/13/2015    Procedure: BILATERAL KNEE ARTHROPLASTY ;  Surgeon: Paralee Cancel, MD;  Location: [facility];  Service: Orthopedics;  Laterality: Bilateral;   Family History  Problem Relation Age of Onset  . Prostate cancer Father   . Prostate cancer Brother   . Colon cancer Neg Hx   . Rectal cancer Neg Hx   . Stomach cancer Neg Hx    Social History  Substance Use Topics  . Smoking status: Never Smoker   . Smokeless tobacco: Never Used  . Alcohol Use: No    Review of Systems 10/14 systems reviewed and are negative other than those stated in the HPI    Allergies   Chlorthalidone  Home Medications   Prior to Admission medications   Medication Sig Start Date End Date Taking? Authorizing Provider  aspirin EC 325 MG tablet Take 1 tablet (325 mg total) by mouth 2 (two) times daily. Start the day after finishing the Xarelto. Take for 4 weeks. 09/14/15 10/14/15 Yes Matthew Babish, PA-C  celecoxib (CELEBREX) 200 MG capsule Take 1 capsule (200 mg total) by mouth every 12 (twelve) hours. 09/14/15  Yes Danae Orleans, PA-C  cyclobenzaprine (FLEXERIL) 10 MG tablet Take 1 tablet (10 mg total) by mouth 3 (three) times daily as needed for muscle spasms. 09/14/15  Yes Danae Orleans, PA-C  Ferrous Sulfate (IRON SLOW RELEASE PO) Take 1 tablet by mouth daily.   Yes Historical Provider, MD  lisinopril (PRINIVIL,ZESTRIL) 40 MG tablet TAKE 1 TABLET (40 MG TOTAL) BY MOUTH DAILY. Patient taking differently: TAKE 1 TABLET (40 MG TOTAL) BY MOUTH every night 03/24/15  Yes [name], MD  Multiple Vitamin (MULTIVITAMIN WITH MINERALS) TABS tablet Take 1 tablet by mouth daily.   Yes Historical Provider, MD  oxyCODONE (OXY IR/ROXICODONE) 5 MG immediate release tablet Take 1-3 tablets (5-15 mg total) by mouth every 4 (four) hours as needed for severe pain. Patient taking differently: Take 5-10 mg by mouth every 6 (six) hours as needed for severe pain.  09/14/15  Yes Matthew Babish, PA-C  Syringe/Needle, Disp, (SYRINGE 3CC/22GX1") 22G X 1" 3 ML MISC 1 Syringe by Does not apply  route once a week. To use to administer testosterone injections 01/16/11  Yes Malka So, NP  testosterone enanthate (DELATESTRYL) 200 MG/ML injection INJECT 0.5ML INTRAMUSCULARLY WEEKLY Patient taking differently: INJECT 0.5ML INTRAMUSCULARLY WEEKLY ON WEDNESDAY. 12/31/12  Yes Blane Ohara McDiarmid, MD  VIAGRA 100 MG tablet TAKE ONE TABLET BY MOUTH AS NEEDED FOR ERECTILE DYSFUNCTION 08/24/15  Yes [name], MD  docusate sodium (COLACE) 100 MG capsule Take 1 capsule (100 mg total) by mouth 2 (two) times  daily. Patient not taking: Reported on 09/27/2015 09/14/15   Danae Orleans, PA-C  ferrous sulfate 325 (65 FE) MG tablet Take 1 tablet (325 mg total) by mouth 3 (three) times daily after meals. Patient not taking: Reported on 09/27/2015 09/14/15   Danae Orleans, PA-C  polyethylene glycol (MIRALAX / Floria Raveling) packet Take 17 g by mouth 2 (two) times daily. Patient not taking: Reported on 09/27/2015 09/14/15   Danae Orleans, PA-C  rivaroxaban (XARELTO) 10 MG TABS tablet Take 1 tablet (10 mg total) by mouth daily. Patient not taking: Reported on 09/27/2015 09/14/15 09/28/15  Danae Orleans, PA-C   BP 117/70 mmHg  Pulse 82  Temp(Src) 97.7 F (36.5 C) (Oral)  Resp 14  SpO2 98% Physical Exam Physical Exam  Nursing note and vitals reviewed. Constitutional: Well developed, well nourished, non-toxic, and in no acute distress Head: Normocephalic and atraumatic.  Mouth/Throat: Oropharynx is clear and moist.  Neck: Normal range of motion. Neck supple.  Cardiovascular: Normal rate and regular rhythm.   Pulmonary/Chest: Effort normal and breath sounds normal.  Abdominal: Soft. There is no tenderness. There is no rebound and no guarding.  Musculoskeletal: Vertical incisions over bilateral knees status post knee replacement surgery. Wounds are clean dry and intact. No drainage, overlying erythema or induration.  Neurological: Alert, no facial droop, fluent speech, moves all extremities symmetrically Skin: Skin is warm and dry.  Psychiatric: Cooperative  ED Course  Procedures (including critical care time) Labs Review Labs Reviewed  CBC WITH DIFFERENTIAL/PLATELET - Abnormal; Notable for the following:    RBC 4.00 (*)    Hemoglobin 11.2 (*)    HCT 34.8 (*)    Platelets 683 (*)    All other components within normal limits  COMPREHENSIVE METABOLIC PANEL - Abnormal; Notable for the following:    BUN 31 (*)    Total Bilirubin 1.3 (*)    All other components within normal limits  URINALYSIS, ROUTINE W REFLEX  MICROSCOPIC (NOT AT [facility]) - Abnormal; Notable for the following:    Color, Urine AMBER (*)    APPearance CLOUDY (*)    All other components within normal limits  I-STAT CG4 LACTIC ACID, ED - Abnormal; Notable for the following:    Lactic Acid, Venous 2.78 (*)    All other components within normal limits  I-STAT TROPOININ, ED  I-STAT CG4 LACTIC ACID, ED    Imaging Review Dg Chest 2 View  09/27/2015  CLINICAL DATA:  Weakness, hypotension EXAM: CHEST  2 VIEW COMPARISON:  03/13/2007 FINDINGS: Heart and mediastinal contours are within normal limits. No focal opacities or effusions. No acute bony abnormality. IMPRESSION: No active cardiopulmonary disease. Electronically Signed   By: Rolm Baptise M.D.   On: 09/27/2015 09:31   I have personally reviewed and evaluated these images and lab results as part of my medical decision-making.   EKG Interpretation   Date/Time:  Monday September 27 2015 09:17:55 EDT Ventricular Rate:  94 PR Interval:  176 QRS Duration: 93 QT Interval:  328 QTC  Calculation: 410 R Axis:   50 Text Interpretation:  Sinus rhythm Minimal ST elevation, anterior leads No  acute changes Confirmed by Milas Schappell MD, Nzinga Ferran [id number]) on 09/27/2015 9:30:13 AM      MDM   Final diagnoses:  Hypovolemia due to dehydration   65 year old male who presents with hypotension. On presentation, he is well-appearing and in no acute distress. Does have systolic blood pressure in the 80s, but appears to be mentating well. Appears dry on exam. He has unremarkable cardiopulmonary exam. He has a benign abdomen. surgical wounds over bilateral knees are clean dry and intact without signs of infection. Workup reveals no major metabolic or electrolyte derangements. Creatinine mildly bumped from baseline 0.7 to 1.16. He has no signs or symptoms of infection, UA and chest x-ray are overall unremarkable and without leukocytosis. He has no history suggestive of GI bleed and his hemoglobin is near baseline of around 11.  EKG not acutely ischemic and troponin x 1 negative. Presentation not concerning for cardiac etiology.  He does have elevated lactic acid, which I do not suspect from sepsis but more dehydration. He is given 2 L of IVF with normalization of his blood pressure. His lactate is cleared. He feels symptomatically improved. Is able to ambulate steady without lightheadedness, weakness, sob, or any other symptoms. Feels he is at baseline. Suspect that his hypotension is from hypovolemia due to dehydration. Given that symptoms fully resolved and he has remained normotensive here over several hours, patient requesting discharge home. I feel that he is appropriate for outpatient management. Encouraged hydration status at home. He will hold his blood pressure medications if his blood pressure are low at home. He will follow up closely with his primary care doctor. Strict return instructions are reviewed. He expressed understanding of all discharge instructions, and felt comfortable to plan of care.  Forde Dandy, MD 09/27/15 [phone]

## 2015-09-27 NOTE — ED Notes (Signed)
Pt s/p bilateral knee replacement 2 weeks ago being sent from orthopedic clinic. Pt tachypneic, hypotensive, c/o weakness, SOB.

## 2015-09-27 NOTE — ED Notes (Signed)
Pt ambulated in hallway without difficulty

## 2015-10-28 ENCOUNTER — Encounter: Payer: Self-pay | Admitting: Family Medicine

## 2015-10-28 ENCOUNTER — Ambulatory Visit (INDEPENDENT_AMBULATORY_CARE_PROVIDER_SITE_OTHER): Payer: BLUE CROSS/BLUE SHIELD | Admitting: Family Medicine

## 2015-10-28 VITALS — BP 120/84 | HR 95 | Temp 98.0°F | Wt 234.0 lb

## 2015-10-28 DIAGNOSIS — E663 Overweight: Secondary | ICD-10-CM

## 2015-10-28 DIAGNOSIS — I10 Essential (primary) hypertension: Secondary | ICD-10-CM

## 2015-10-28 LAB — LIPID PANEL
Cholesterol: 161 mg/dL (ref 125–200)
HDL: 41 mg/dL (ref >=40)
LDL Cholesterol: 88 mg/dL (ref <130)
Total CHOL/HDL Ratio: 3.9 Ratio (ref <=5.0)
Triglycerides: 158 mg/dL — ABNORMAL HIGH (ref <150)
VLDL: 32 mg/dL — ABNORMAL HIGH (ref <30)

## 2015-10-28 LAB — POCT GLYCOSYLATED HEMOGLOBIN (HGB A1C): Hemoglobin A1C: 5.3

## 2015-10-28 MED ORDER — LISINOPRIL 20 MG PO TABS: 20.0000 mg | ORAL_TABLET | Freq: Every day | ORAL | Status: DC

## 2015-10-28 NOTE — Assessment & Plan Note (Signed)
Well-controlled today Continue lisinopril 20 mg daily Refill sent to pharmacy Recent BMP within normal limits

## 2015-10-28 NOTE — Progress Notes (Signed)
   Subjective:   Thomas [name] is a 65 y.o. male with a history of Hypertension and osteoarthritis here for blood pressure management  HTN: - Medications: Lisinopril - was previously prescribed 40 mg daily, but after total knee replacement on 5/22, his blood pressure is 80/50 and his 2 week follow-up visit to the emergency department - He stopped his antihypertensive for 2 weeks at that time and noticed an increase in his blood pressure - Since then taking 20 mg of lisinopril daily and noted that his home blood pressure readings have been 135-140/80-85 - Denies any SOB, CP, vision changes, LE edema, medication SEs, or symptoms of hypotension - Diet: Avoids greasy and high-fat foods and excess salt - Exercise: 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   Social History: Never smoker  Objective:  BP 120/84 mmHg  Pulse 95  Temp(Src) 98 F (36.7 C) (Oral)  Wt 234 lb (106.142 kg)  SpO2 99%  Gen:  65 y.o. male in NAD HEENT: NCAT, MMM, EOMI, anicteric sclerae CV: RRR, no MRG Resp: Non-labored, CTAB, no wheezes noted Ext: WWP, no edema MSK: No obvious deformities Neuro: Alert and oriented, speech normal      Chemistry      Component Value Date/Time   NA 138 09/27/2015 0920   K 3.9 09/27/2015 0920   CL 105 09/27/2015 0920   CO2 24 09/27/2015 0920   BUN 31* 09/27/2015 0920   CREATININE 1.16 09/27/2015 0920   CREATININE 0.86 07/14/2015 0852      Component Value Date/Time   CALCIUM 9.4 09/27/2015 0920   ALKPHOS 110 09/27/2015 0920   AST 26 09/27/2015 0920   ALT 30 09/27/2015 0920   BILITOT 1.3* 09/27/2015 0920       Lab Results  Component Value Date   HGBA1C 5.6 08/10/2014   Assessment & Plan:     Thomas [name] is a 65 y.o. male here for   Hypertension Well-controlled today Continue lisinopril 20 mg daily Refill sent to pharmacy Recent BMP within normal limits  Overweight (BMI 25.0-29.9) Screening lipid panel and  A1c today     Virginia Crews, MD MPH PGY-3,  [facility] 10/28/2015  10:07 AM

## 2015-10-28 NOTE — Patient Instructions (Signed)
Continue lisinopril 20 mg daily. We are getting some labs today and someone will call you or send you a letter with the results when they're available.  Follow-up with Dr. Erin Hearing on your blood pressure in 3 months.   Take care, Dr. Jacinto Reap

## 2015-10-28 NOTE — Assessment & Plan Note (Signed)
Screening lipid panel and A1c today

## 2015-10-29 ENCOUNTER — Encounter: Payer: Self-pay | Admitting: Family Medicine

## 2015-12-02 ENCOUNTER — Ambulatory Visit (INDEPENDENT_AMBULATORY_CARE_PROVIDER_SITE_OTHER): Payer: BLUE CROSS/BLUE SHIELD | Admitting: Internal Medicine

## 2015-12-02 ENCOUNTER — Encounter: Payer: Self-pay | Admitting: Internal Medicine

## 2015-12-02 DIAGNOSIS — R0781 Pleurodynia: Secondary | ICD-10-CM | POA: Diagnosis not present

## 2015-12-02 DIAGNOSIS — R0789 Other chest pain: Secondary | ICD-10-CM | POA: Insufficient documentation

## 2015-12-02 MED ORDER — OXYCODONE HCL 5 MG PO TABS: 5.0000 mg | ORAL_TABLET | Freq: Four times a day (QID) | ORAL | 0 refills | Status: DC | PRN

## 2015-12-02 NOTE — Assessment & Plan Note (Signed)
Suspect patient bruised lateral ribs secondary to fall. Patient potentially with small fracture to that area as well although no step off point appreciated. Respiratory status stable.  -#30 Oxycodone 5 mg ordered  -patient to continue with daily NSAIDs prescribed after recent knee surgery  -ice or heat to the area  -rib belt or Ace bandage discussed  -return precautions given

## 2015-12-02 NOTE — Progress Notes (Signed)
   Subjective:    Thomas [name] - 65 y.o. male MRN [id number]  Date of birth: Sep 01, 1950  HPI  Thomas [name] is here for SDA for rib pain.   Rib Pain: [place] on Friday (6 days ago) on a 3" stump off the ground onto the left side of his ribs. Has not noticed any bruising over the area and no open abrasions. Pain worsens when he lays down. Pain withlifing/pulling with left hand. Pain has been getting worse at night and has not improved since Friday. Has tried OTC pain medications without relief. Has not tried ice or heat to the area. Denies abdominal pain. No SOB or cough.   Social Hx:  -  reports that he has never smoked. He has never used smokeless tobacco. - Review of Systems: Per HPI.  - Past Medical History: Patient Active Problem List   Diagnosis Date Noted  . Rib pain on left side 12/02/2015  . Overweight (BMI 25.0-29.9) 09/15/2015  . S/P bilateral TKA 09/13/2015  . Lateral epicondylitis of right elbow 12/30/2014  . Other bilateral secondary osteoarthritis of knee 07/14/2014  . Encounter for chronic pain management 05/06/2014  . Heme positive stool 05/21/2013  . Elevated PSA 10/10/2012  . Osteoarthritis, multiple sites 01/13/2011  . Erythrocytosis 12/15/2010  . Hypertension 12/06/2010  . ED (erectile dysfunction) 08/25/2010  . Testosterone deficiency 08/23/2010  . CLAUSTROPHOBIA 03/01/2010   - Medications: reviewed and updated    Objective:   Physical Exam BP (!) 134/91   Pulse 91   Temp 98.3 F (36.8 C) (Oral)   Ht 6\' 8"  (2.032 m)   Wt 236 lb 3.2 oz (107.1 kg)   BMI 25.95 kg/m  Gen: NAD, alert, cooperative with exam, well-appearing CV: RRR, good S1/S2, no murmur Resp: CTABL, no wheezes, non-labored, good breath sounds throughout all fields  Abd: SNTND, BS present, no guarding or organomegaly MSK: TTP over left lateral ribs (area of ribs 5-7) directly in line with axilla, no step off point appreciated, no overlying ecchymosis or break in skin Neuro: Strength  5/5 in UE bilaterally.     Assessment & Plan:   Rib pain on left side Suspect patient bruised lateral ribs secondary to fall. Patient potentially with small fracture to that area as well although no step off point appreciated. Respiratory status stable.  -#30 Oxycodone 5 mg ordered  -patient to continue with daily NSAIDs prescribed after recent knee surgery  -ice or heat to the area  -rib belt or Ace bandage discussed  -return precautions given     Phill Myron, D.O. 12/02/2015, 10:00 AM PGY-2, [place]

## 2015-12-02 NOTE — Patient Instructions (Signed)
Take the Oxycodone for severe pain. Continue with your Celebrex. I would not increase your dose as you are already taking the max dose.   You can try wrapping them with an Ace bandage or buying a rib belt over the counter.   Please seek medical care if you have difficulty breathing or any new respiratory symptoms.

## 2015-12-03 ENCOUNTER — Other Ambulatory Visit: Payer: Self-pay | Admitting: Internal Medicine

## 2015-12-03 MED ORDER — HYDROCODONE-ACETAMINOPHEN 10-325 MG PO TABS: 1.0000 | ORAL_TABLET | Freq: Three times a day (TID) | ORAL | 0 refills | Status: DC | PRN

## 2015-12-03 NOTE — Progress Notes (Signed)
Patient returned to clinic due to issue filling Oxycodone Rx given yesterday. Patient reports that pharmacy would not fill medication. Concerned because he is leaving for [place] tomorrow and will be gone for 1.5 weeks. Looked patient up on [place] for controlled substances and last Rx for Oxycodone (by ortho) was at end of July. He filled #240 Oxycodone in the month of July. Patient reports he only has 4 tablets left because he started taking more after his acute injury.   Called his pharmacy who said they could fill Oxycodone but insurance would not pay for it given time course between last Rx and this Rx.   Rx for #30 Norco given today due to acute injury and upcoming vacation. Discussed with patient that he should not take Tylenol with this medication. Also discussed with patient that he has had a lot of narcotics recently and that they are not a long term medication. Would recommend further discussion of narcotic use between patient and PCP.   Phill Myron, D.O. 12/03/2015, 9:42 AM PGY-2, [place]

## 2015-12-07 ENCOUNTER — Ambulatory Visit: Payer: BLUE CROSS/BLUE SHIELD | Admitting: Internal Medicine

## 2015-12-13 ENCOUNTER — Other Ambulatory Visit: Payer: Self-pay | Admitting: Family Medicine

## 2015-12-13 MED ORDER — HYDROCODONE-ACETAMINOPHEN 10-325 MG PO TABS
1.0000 | ORAL_TABLET | Freq: Three times a day (TID) | ORAL | 0 refills | Status: DC | PRN
Start: 2015-12-13 — End: 2016-04-05

## 2015-12-13 NOTE — Telephone Encounter (Signed)
Needs refill on hydrocodone

## 2015-12-18 ENCOUNTER — Other Ambulatory Visit: Payer: Self-pay | Admitting: Family Medicine

## 2015-12-20 ENCOUNTER — Telehealth: Payer: Self-pay | Admitting: Family Medicine

## 2015-12-20 MED ORDER — LISINOPRIL 20 MG PO TABS: 20.0000 mg | ORAL_TABLET | Freq: Every day | ORAL | 1 refills | Status: DC

## 2015-12-20 NOTE — Telephone Encounter (Signed)
Patient would like to know why refill request for Lisionpril was refused. Please call patient.

## 2015-12-20 NOTE — Telephone Encounter (Signed)
Spoke with him He is taking 20 mg of lisinopril

## 2016-03-29 ENCOUNTER — Telehealth: Payer: Self-pay | Admitting: Family Medicine

## 2016-03-29 NOTE — Telephone Encounter (Signed)
Pt would like a referral for a pain management clinic in [place]. Please advise. Thanks! ep

## 2016-03-31 ENCOUNTER — Ambulatory Visit (INDEPENDENT_AMBULATORY_CARE_PROVIDER_SITE_OTHER): Payer: BLUE CROSS/BLUE SHIELD

## 2016-03-31 ENCOUNTER — Ambulatory Visit (INDEPENDENT_AMBULATORY_CARE_PROVIDER_SITE_OTHER): Payer: BLUE CROSS/BLUE SHIELD | Admitting: Podiatry

## 2016-03-31 DIAGNOSIS — L84 Corns and callosities: Secondary | ICD-10-CM

## 2016-03-31 DIAGNOSIS — L600 Ingrowing nail: Secondary | ICD-10-CM

## 2016-04-03 ENCOUNTER — Telehealth: Payer: Self-pay | Admitting: *Deleted

## 2016-04-03 NOTE — Telephone Encounter (Signed)
Should see me at an office visit  to determine what type of referral I should do  Also to check his blood pressure Ok to dble book him  Thanks  LC

## 2016-04-03 NOTE — Progress Notes (Signed)
Subjective:     Patient ID: Thomas [name], male   DOB: 04-04-1951, 65 y.o.   MRN: [id number]  HPI patient presents stating he has a painful hallux that's been bothering him and making walking difficult and he's tried to soak it himself and trim it out without relief   Review of Systems     Objective:   Physical Exam Neurovascular status intact muscle strength adequate incurvated right hallux lateral border that's painful when pressed and makes shoe gear difficult    Assessment:     Chronic ingrown toenail deformity right hallux lateral border with pain    Plan:     H&P condition reviewed and recommended removal of the nail border. Patient wants surgery and I explained risk of procedure and today I infiltrated the right hallux 60 mg like Marcaine mixture remove the border exposed matrix and applied phenol 3 applications 30 seconds followed by alcohol lavage and sterile dressing. Gave instructions on soaks and reappoint

## 2016-04-03 NOTE — Telephone Encounter (Signed)
Pt states he had an ingrown procedure 03/31/2016 and the toe is bright.red. I spoke with pt, he is doing antibacterial soaks once daily and allowing the area to air dry, due to his work schedule. I told pt to begin epsom salt soaks 1/2 C in 1 qt warm water 2 x daily 20 minutes each session, then apply bactroban ointment after each soak for 4-6 weeks and could at end of 4th week allow to air dry when not in shoes or up walking around. I told pt to call Wednesday or Thursday with status.

## 2016-04-04 ENCOUNTER — Telehealth: Payer: Self-pay | Admitting: Family Medicine

## 2016-04-04 NOTE — Telephone Encounter (Signed)
Returned pts call and apt scheduled for tomm with Dr. Erin Hearing.

## 2016-04-04 NOTE — Telephone Encounter (Signed)
LM for pt to return my call for scheduling. Please help him in scheduling with Dr. Erin Hearing or another blue team provider if urgent.

## 2016-04-04 NOTE — Telephone Encounter (Signed)
Apt scheduled for tomm with Dr. Erin Hearing.

## 2016-04-04 NOTE — Telephone Encounter (Signed)
Pt is returning Paige's call. jw

## 2016-04-05 ENCOUNTER — Encounter: Payer: Self-pay | Admitting: Family Medicine

## 2016-04-05 ENCOUNTER — Ambulatory Visit (INDEPENDENT_AMBULATORY_CARE_PROVIDER_SITE_OTHER): Payer: BLUE CROSS/BLUE SHIELD | Admitting: Family Medicine

## 2016-04-05 DIAGNOSIS — M153 Secondary multiple arthritis: Secondary | ICD-10-CM | POA: Diagnosis not present

## 2016-04-05 DIAGNOSIS — R195 Other fecal abnormalities: Secondary | ICD-10-CM

## 2016-04-05 DIAGNOSIS — I10 Essential (primary) hypertension: Secondary | ICD-10-CM | POA: Diagnosis not present

## 2016-04-05 LAB — BASIC METABOLIC PANEL
BUN: 17 mg/dL (ref 7–25)
CO2: 28 mmol/L (ref 20–31)
Calcium: 9.4 mg/dL (ref 8.6–10.3)
Chloride: 102 mmol/L (ref 98–110)
Creat: 0.88 mg/dL (ref 0.70–1.25)
Glucose, Bld: 99 mg/dL (ref 65–99)
Potassium: 4.4 mmol/L (ref 3.5–5.3)
Sodium: 139 mmol/L (ref 135–146)

## 2016-04-05 LAB — CBC
HCT: 47.7 % (ref 38.5–50.0)
Hemoglobin: 15.9 g/dL (ref 13.2–17.1)
MCH: 28.4 pg (ref 27.0–33.0)
MCHC: 33.3 g/dL (ref 32.0–36.0)
MCV: 85.2 fL (ref 80.0–100.0)
MPV: 10.1 fL (ref 7.5–12.5)
Platelets: 259 10*3/uL (ref 140–400)
RBC: 5.6 MIL/uL (ref 4.20–5.80)
RDW: 14.6 % (ref 11.0–15.0)
WBC: 4.9 10*3/uL (ref 3.8–10.8)

## 2016-04-05 NOTE — Progress Notes (Signed)
Subjective  Patient is presenting with the following illnesses  Pain in elbows and wrists Still working full time as [employer]. Worsens after activity.  No specific location of pain or soft tissue swelling or redness.  No specific injury or hand weakness.  Using tylenol and ibuprofen as needed.  No narcotics since the summer after surgery for his knees.  Does feel he has a funny tickling sensation in both elbows with deep inspiration  HYPERTENSION Disease Monitoring  Home BP Monitoring (Severity) usually 130s/80s Symptoms - Chest pain- no    Dyspnea- no Medications (Modifying factors) Compliance-  Lisinopril daily. Lightheadedness-  no  Edema- no Timing - continuous  Duration - years ROS - See HPI  PMH Lab Review   Potassium  Date Value Ref Range Status  09/27/2015 3.9 3.5 - 5.1 mmol/L Final   Sodium  Date Value Ref Range Status  09/27/2015 138 135 - 145 mmol/L Final   Creat  Date Value Ref Range Status  07/14/2015 0.86 0.70 - 1.25 mg/dL Final   Creatinine, Ser  Date Value Ref Range Status  09/27/2015 1.16 0.61 - 1.24 mg/dL Final       Heme Positive Stool No weight loss or blood in bowel movement.  No abdomen pain or bowel changes.   Made an appointment once to get colonoscopy but canceled due to cost   Chief Complaint noted Review of Symptoms - see HPI PMH - Smoking status noted.     Objective Vital Signs reviewed Psych:  Cognition and judgment appear intact. Alert, communicative  and cooperative with normal attention span and concentration. No apparent delusions, illusions, hallucinations Elbows Wrists - seemed thickened from chronic use.  FROM but mildly painful. No obv effusions.  Normal grip     Assessments/Plans  No problem-specific Assessment & Plan notes found for this encounter.   See Encounter view if individual problem A/Ps not visible See after visit summary for details of patient instuctions

## 2016-04-05 NOTE — Assessment & Plan Note (Signed)
Near goal.  Home blood pressure seems to be better and he does have severe claustrophobia that elevates his blood pressure in the office Check labs continue current treatment

## 2016-04-05 NOTE — Assessment & Plan Note (Signed)
Again warned him that he could have colon cancer and strongly advised to have a colonoscopy

## 2016-04-05 NOTE — Patient Instructions (Addendum)
Good to see you today!  Thanks for coming in.  I will call you if your tests are not good.  Otherwise I will send you a letter.  If you do not hear from me with in 2 weeks please call our office.     I will refer you to sports medication  You need a colonoscopy to make sure you don't have colon cancer

## 2016-04-05 NOTE — Assessment & Plan Note (Signed)
Worsened.  Has pain in both elbows and wrists worse on the left.   Most likely due to DJD that is work related - still working as a Music therapist.   Will refer to [facility] for second opinion and to see if they can offer other treatments or suggestions.  I think it would be reasonable to offer intermittent lower dose narcotics as he had taken in the successfully in the past if no other good options

## 2016-04-06 ENCOUNTER — Encounter: Payer: Self-pay | Admitting: Family Medicine

## 2016-04-10 ENCOUNTER — Ambulatory Visit: Payer: BLUE CROSS/BLUE SHIELD | Admitting: Sports Medicine

## 2016-04-12 ENCOUNTER — Telehealth: Payer: Self-pay | Admitting: Family Medicine

## 2016-04-12 ENCOUNTER — Ambulatory Visit (INDEPENDENT_AMBULATORY_CARE_PROVIDER_SITE_OTHER): Payer: BLUE CROSS/BLUE SHIELD | Admitting: Sports Medicine

## 2016-04-12 ENCOUNTER — Ambulatory Visit
Admission: RE | Admit: 2016-04-12 | Discharge: 2016-04-12 | Disposition: A | Discharge status: Home / Self Care | Payer: BLUE CROSS/BLUE SHIELD | Source: Ambulatory Visit | Attending: Sports Medicine | Admitting: Sports Medicine

## 2016-04-12 ENCOUNTER — Encounter: Payer: Self-pay | Admitting: Sports Medicine

## 2016-04-12 VITALS — BP 134/84 | Ht >= 80 in | Wt 248.0 lb

## 2016-04-12 DIAGNOSIS — M158 Other polyosteoarthritis: Secondary | ICD-10-CM

## 2016-04-12 DIAGNOSIS — M25532 Pain in left wrist: Secondary | ICD-10-CM

## 2016-04-12 DIAGNOSIS — M19022 Primary osteoarthritis, left elbow: Secondary | ICD-10-CM | POA: Diagnosis not present

## 2016-04-12 DIAGNOSIS — M25529 Pain in unspecified elbow: Secondary | ICD-10-CM

## 2016-04-12 DIAGNOSIS — M19021 Primary osteoarthritis, right elbow: Secondary | ICD-10-CM

## 2016-04-12 IMAGING — CR DG ELBOW 2V*L*
2 series · 2 of 2 positions shown · non-contrast
Comparison: None.

CLINICAL DATA: Left elbow pain for 5 months with no known injury.

EXAM:
LEFT ELBOW - 2 VIEW

[x elbow joint ap left]
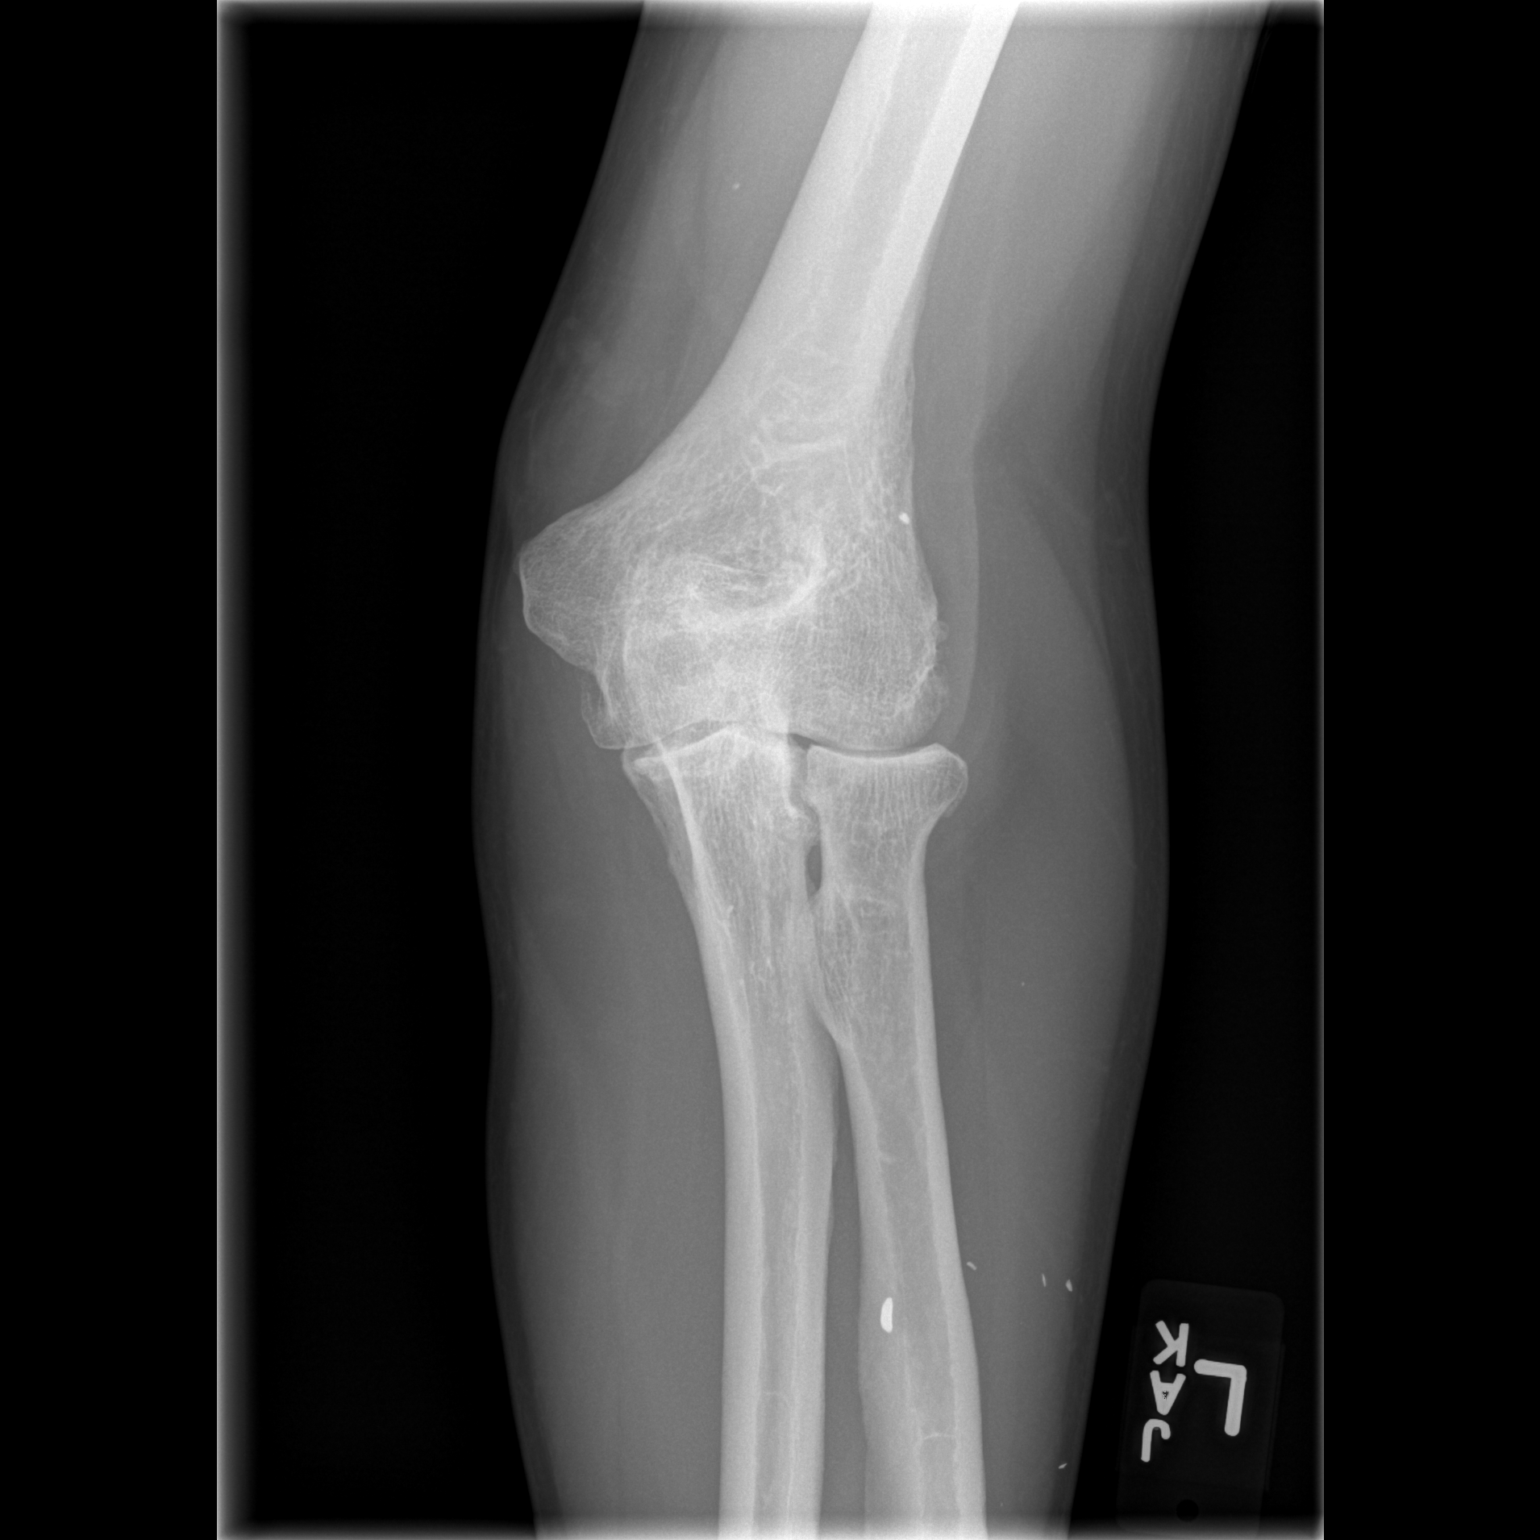

[x elbow joint lat left]
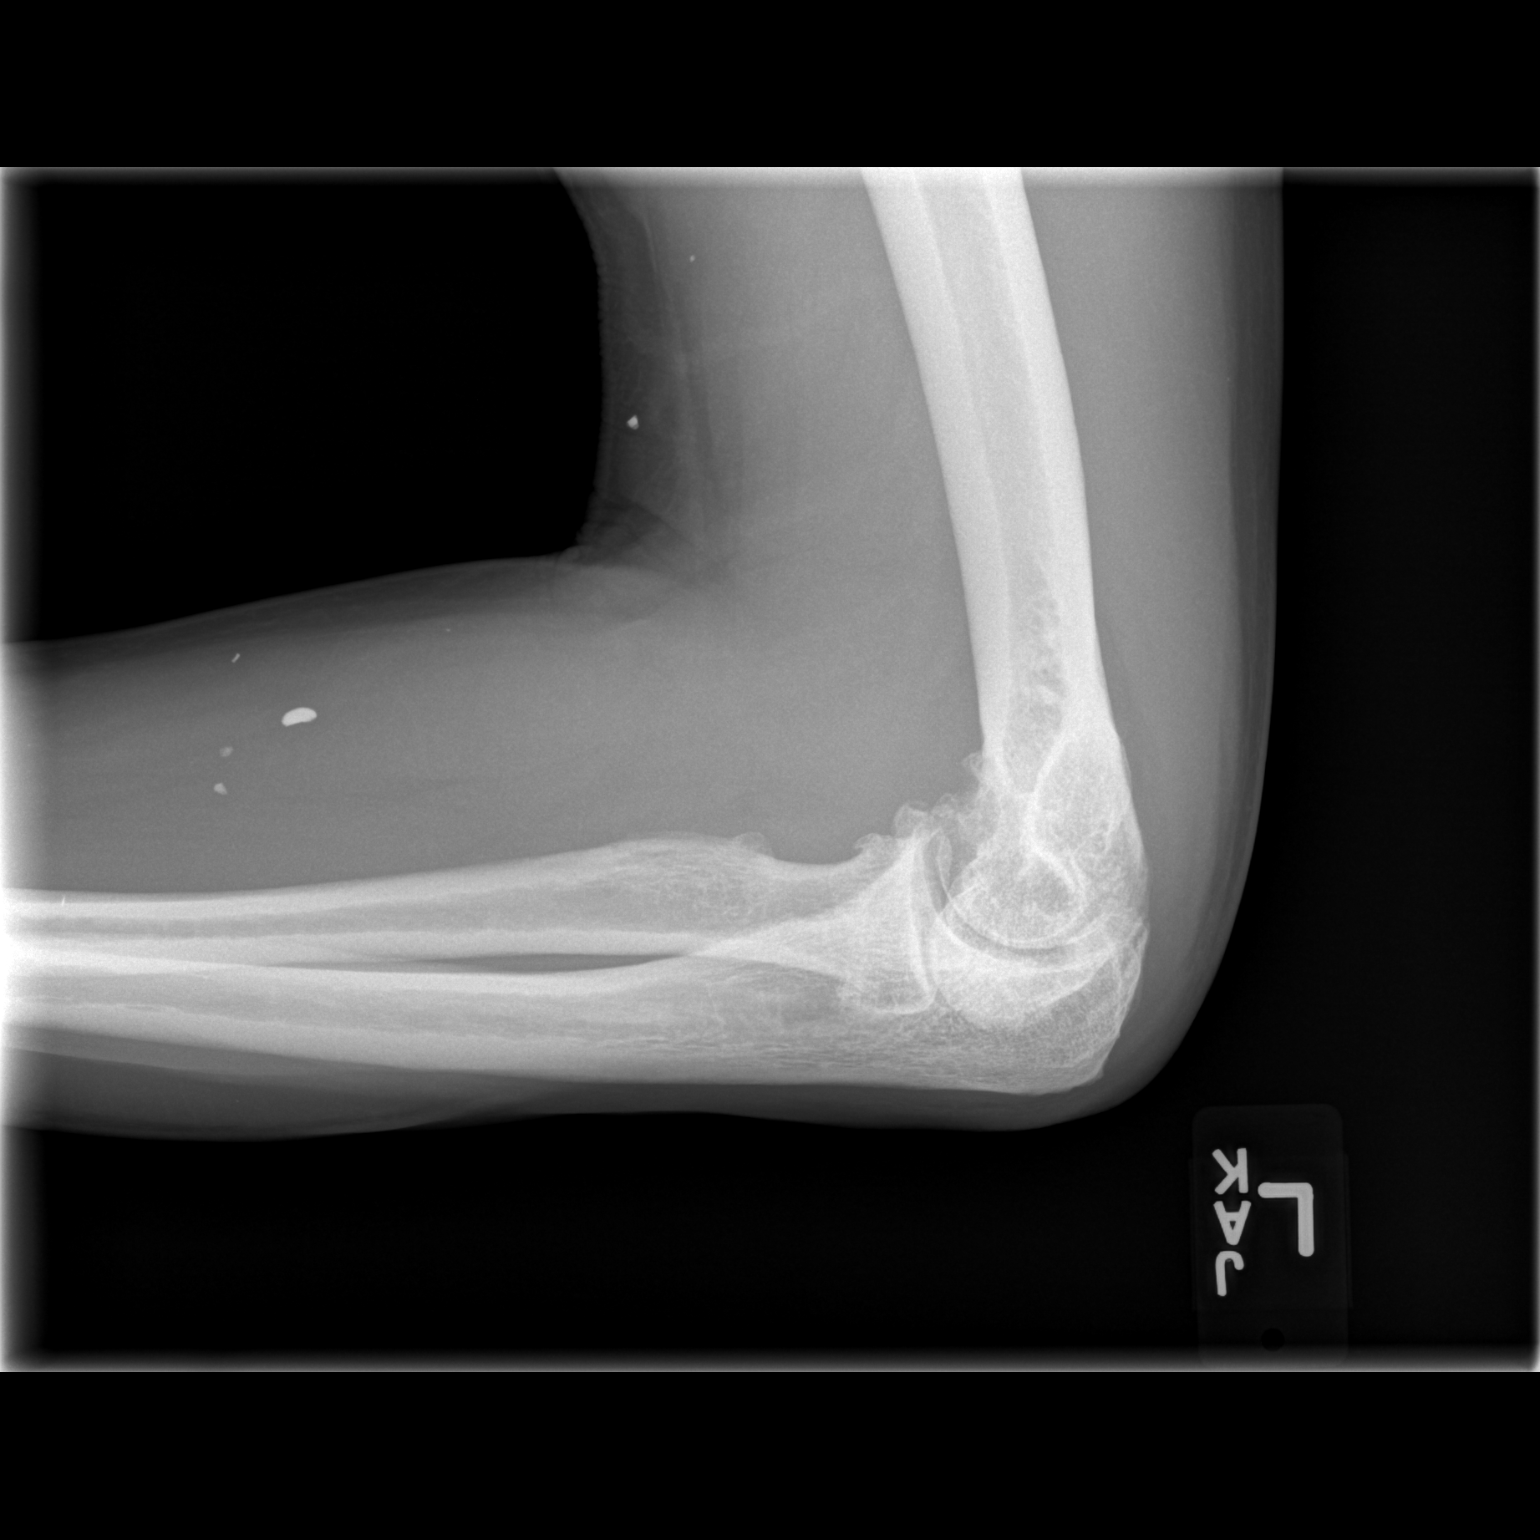

[2 of 2 positions shown; findings below may reference images not displayed]

FINDINGS: There is no evidence of acute fracture or dislocation. An elbow
joint effusion is present, likely moderate in size. Mild-to-moderate
degenerative spurring is present involving the humerus, ulna, and to
a lesser extent radial head. Multiple small radiopaque foreign
bodies are present in the volar soft tissues of the lower portion of
the upper arm and upper portion of the forearm.
IMPRESSION: Mild-to-moderate degenerative changes at the elbow with associated
joint effusion. No acute osseous abnormality identified.

## 2016-04-12 IMAGING — CR DG WRIST 2V*L*
2 series · 2 of 2 positions shown · non-contrast
Comparison: None.

CLINICAL DATA: Left wrist pain, no known injury

EXAM:
LEFT WRIST - 2 VIEW

[x wrist pa left]
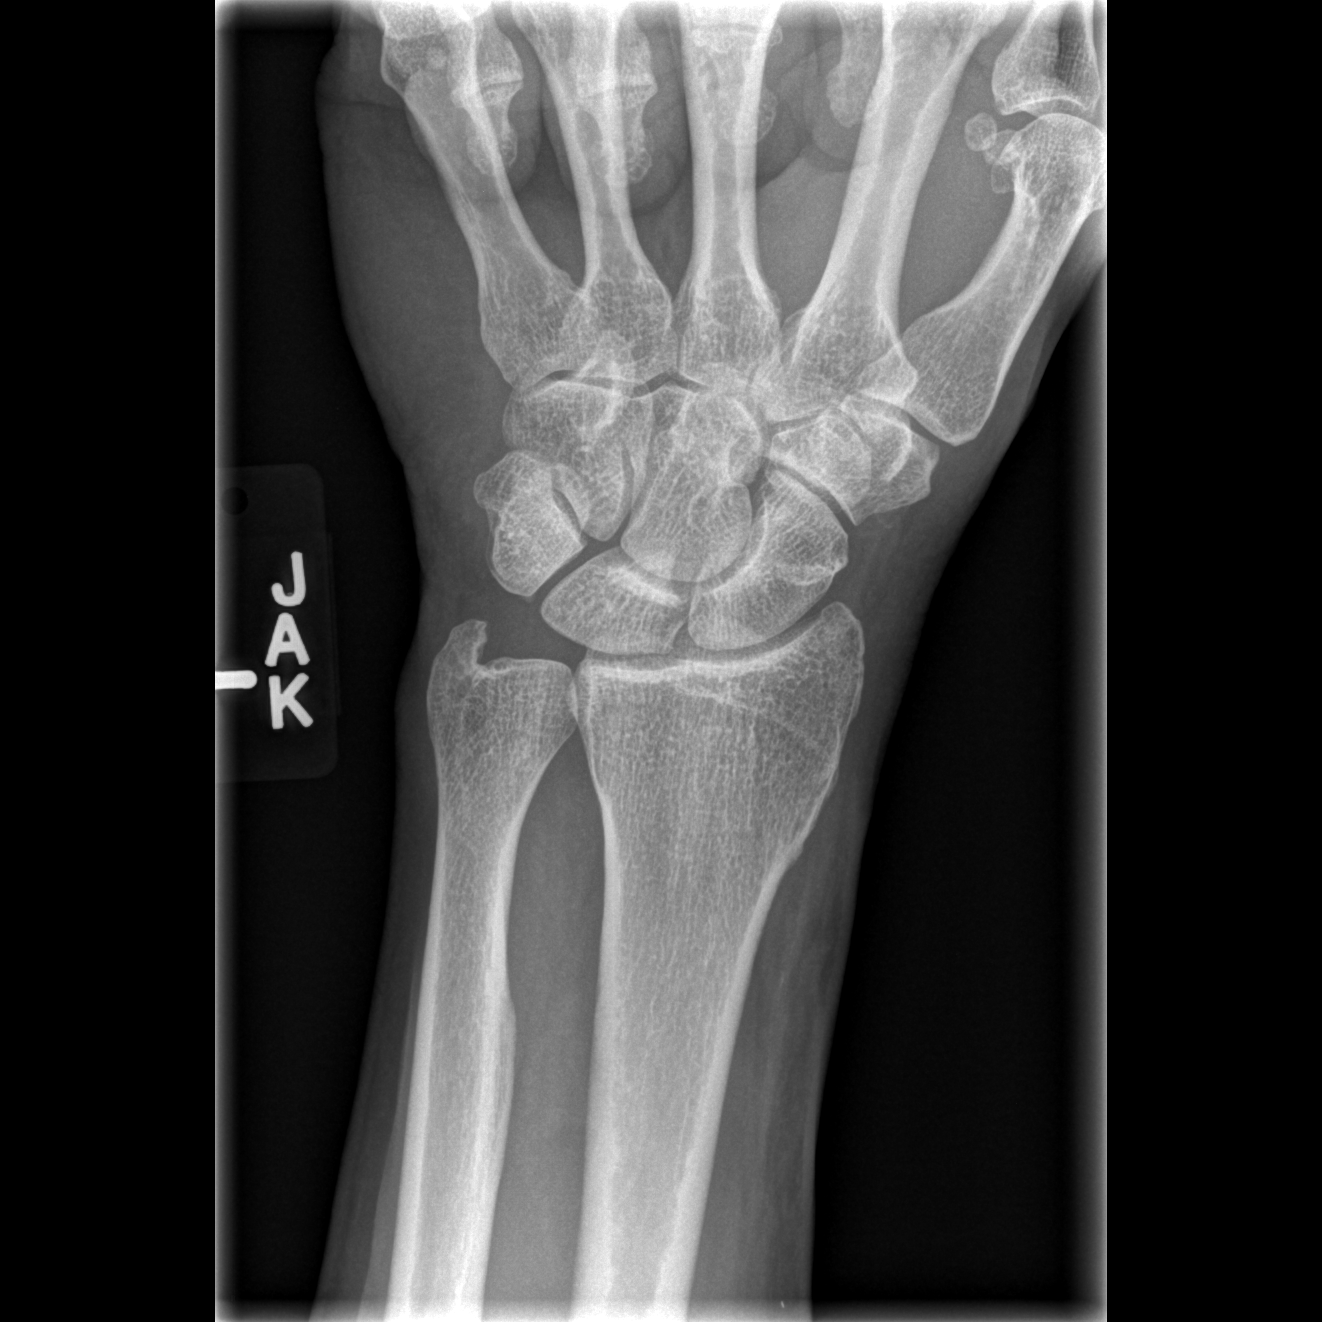

[x wrist lat left]
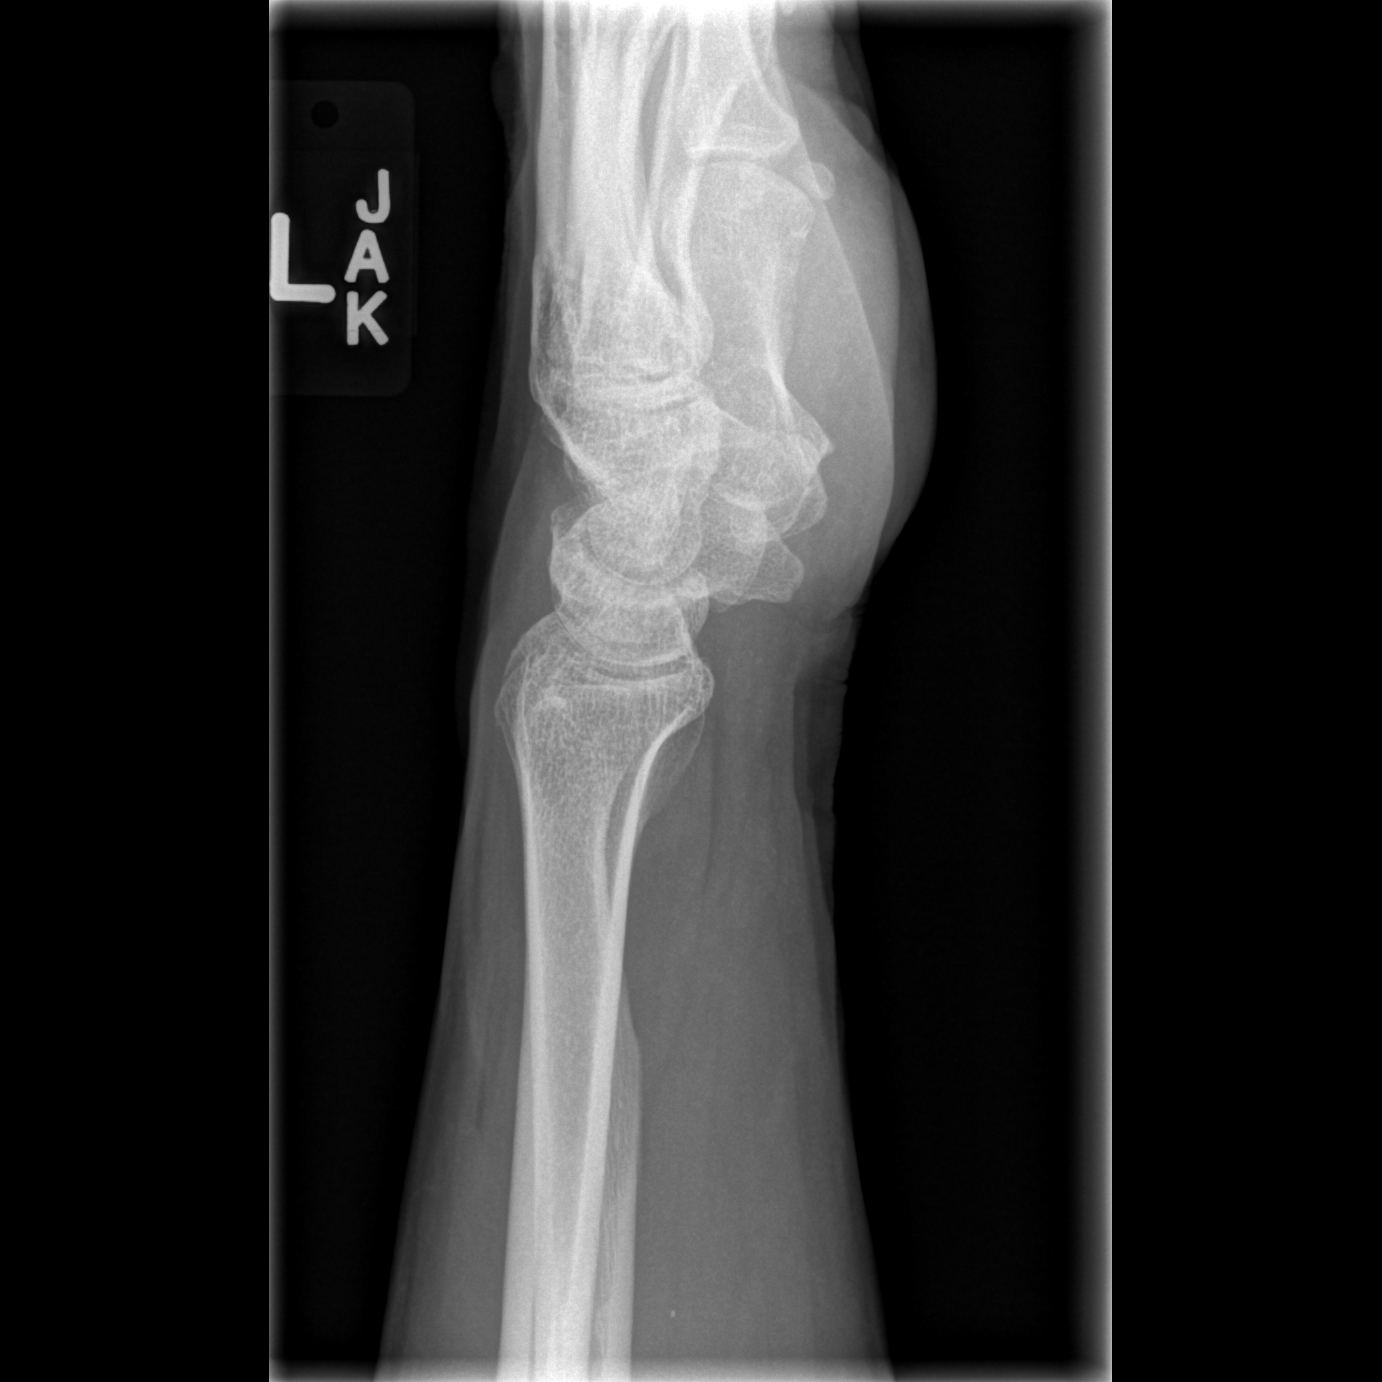

[2 of 2 positions shown; findings below may reference images not displayed]

FINDINGS: Two views of the left wrist submitted. No acute fracture or
subluxation. Mild narrowing of radiocarpal joint space.
IMPRESSION: No acute fracture or subluxation. Mild narrowing of radiocarpal
joint space.

## 2016-04-12 IMAGING — CR DG ELBOW 2V*R*
2 series · 2 of 2 positions shown · non-contrast
Comparison: [DATE]

CLINICAL DATA: Right elbow pain for 5 months

EXAM:
RIGHT ELBOW - 2 VIEW

[x elbow joint ap right]
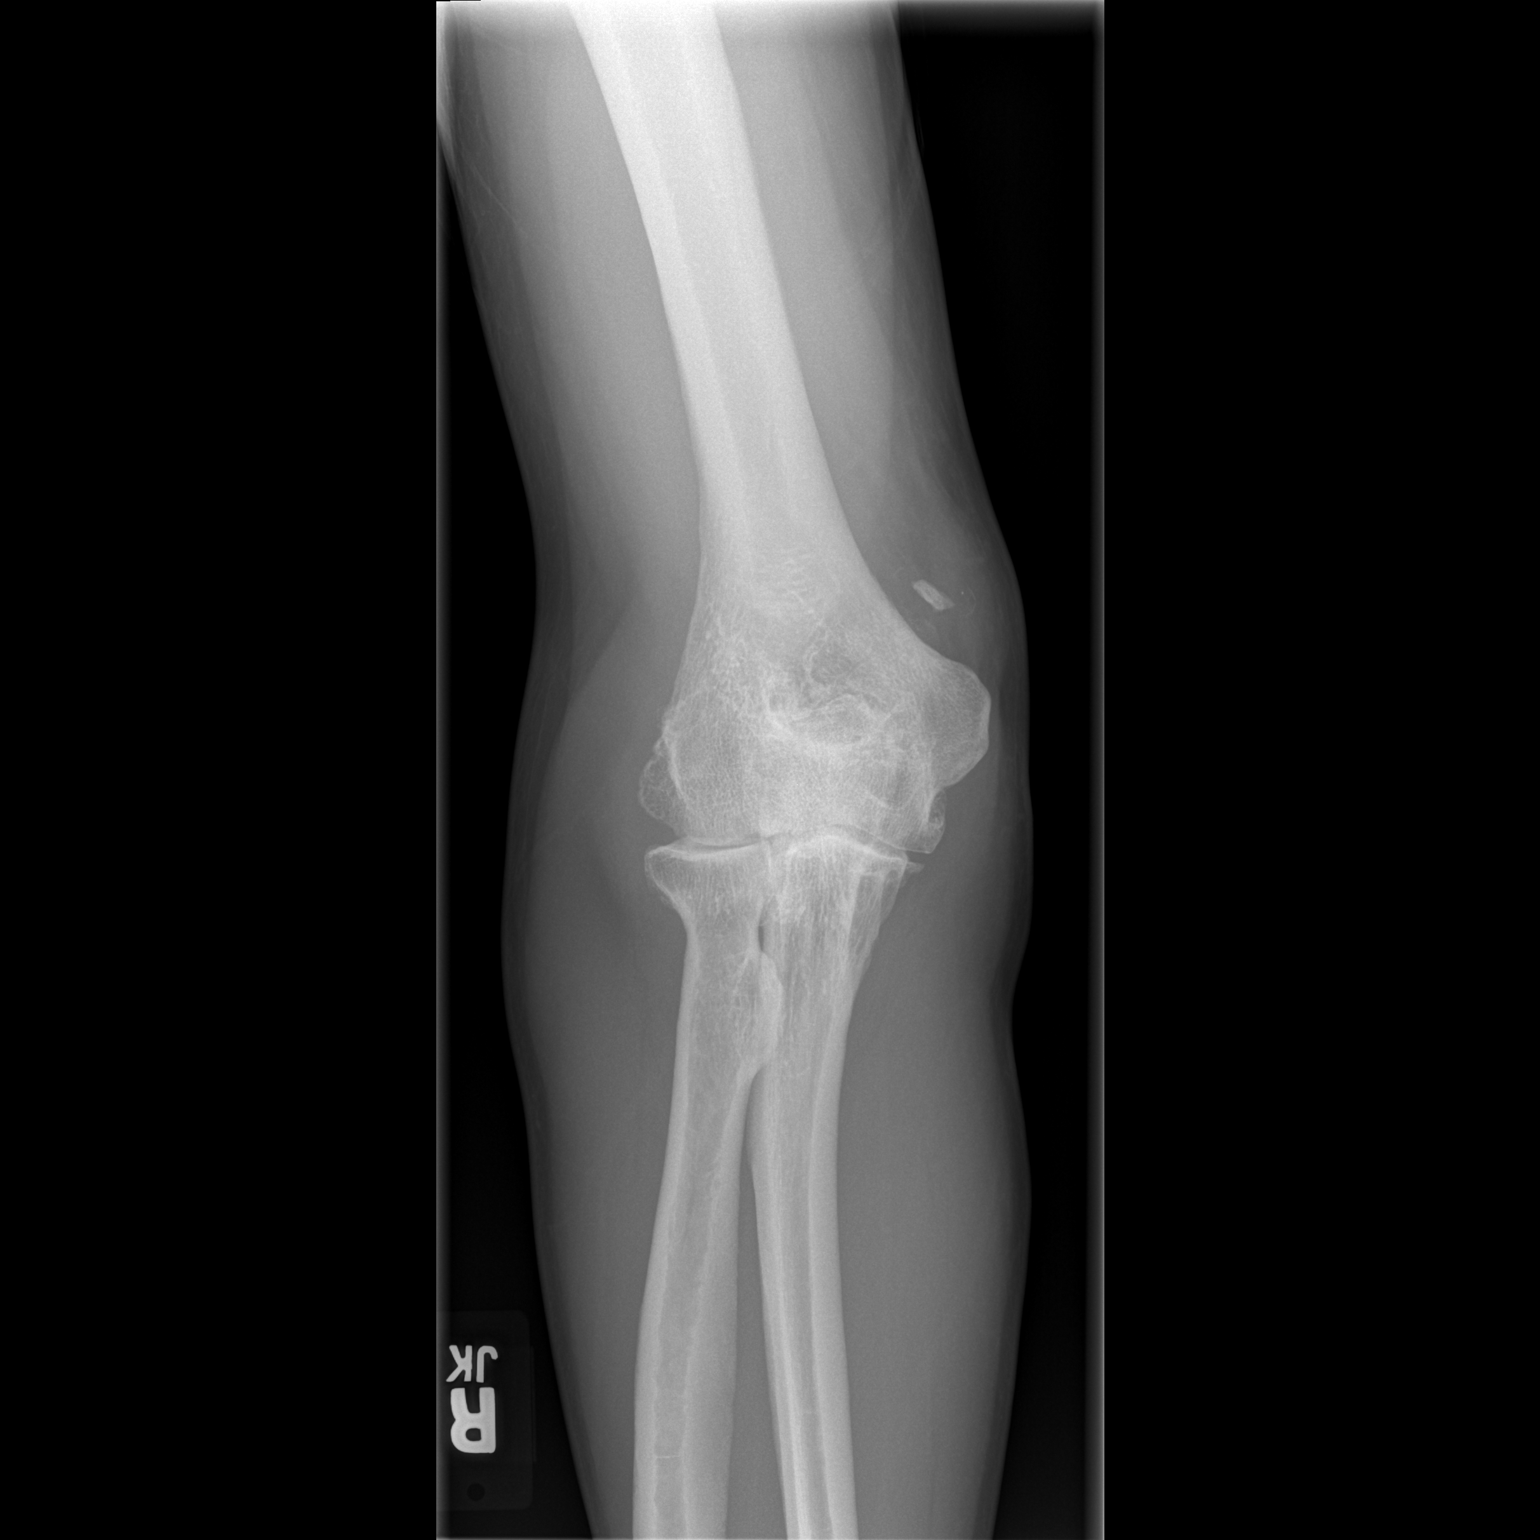

[x elbow joint lat right]
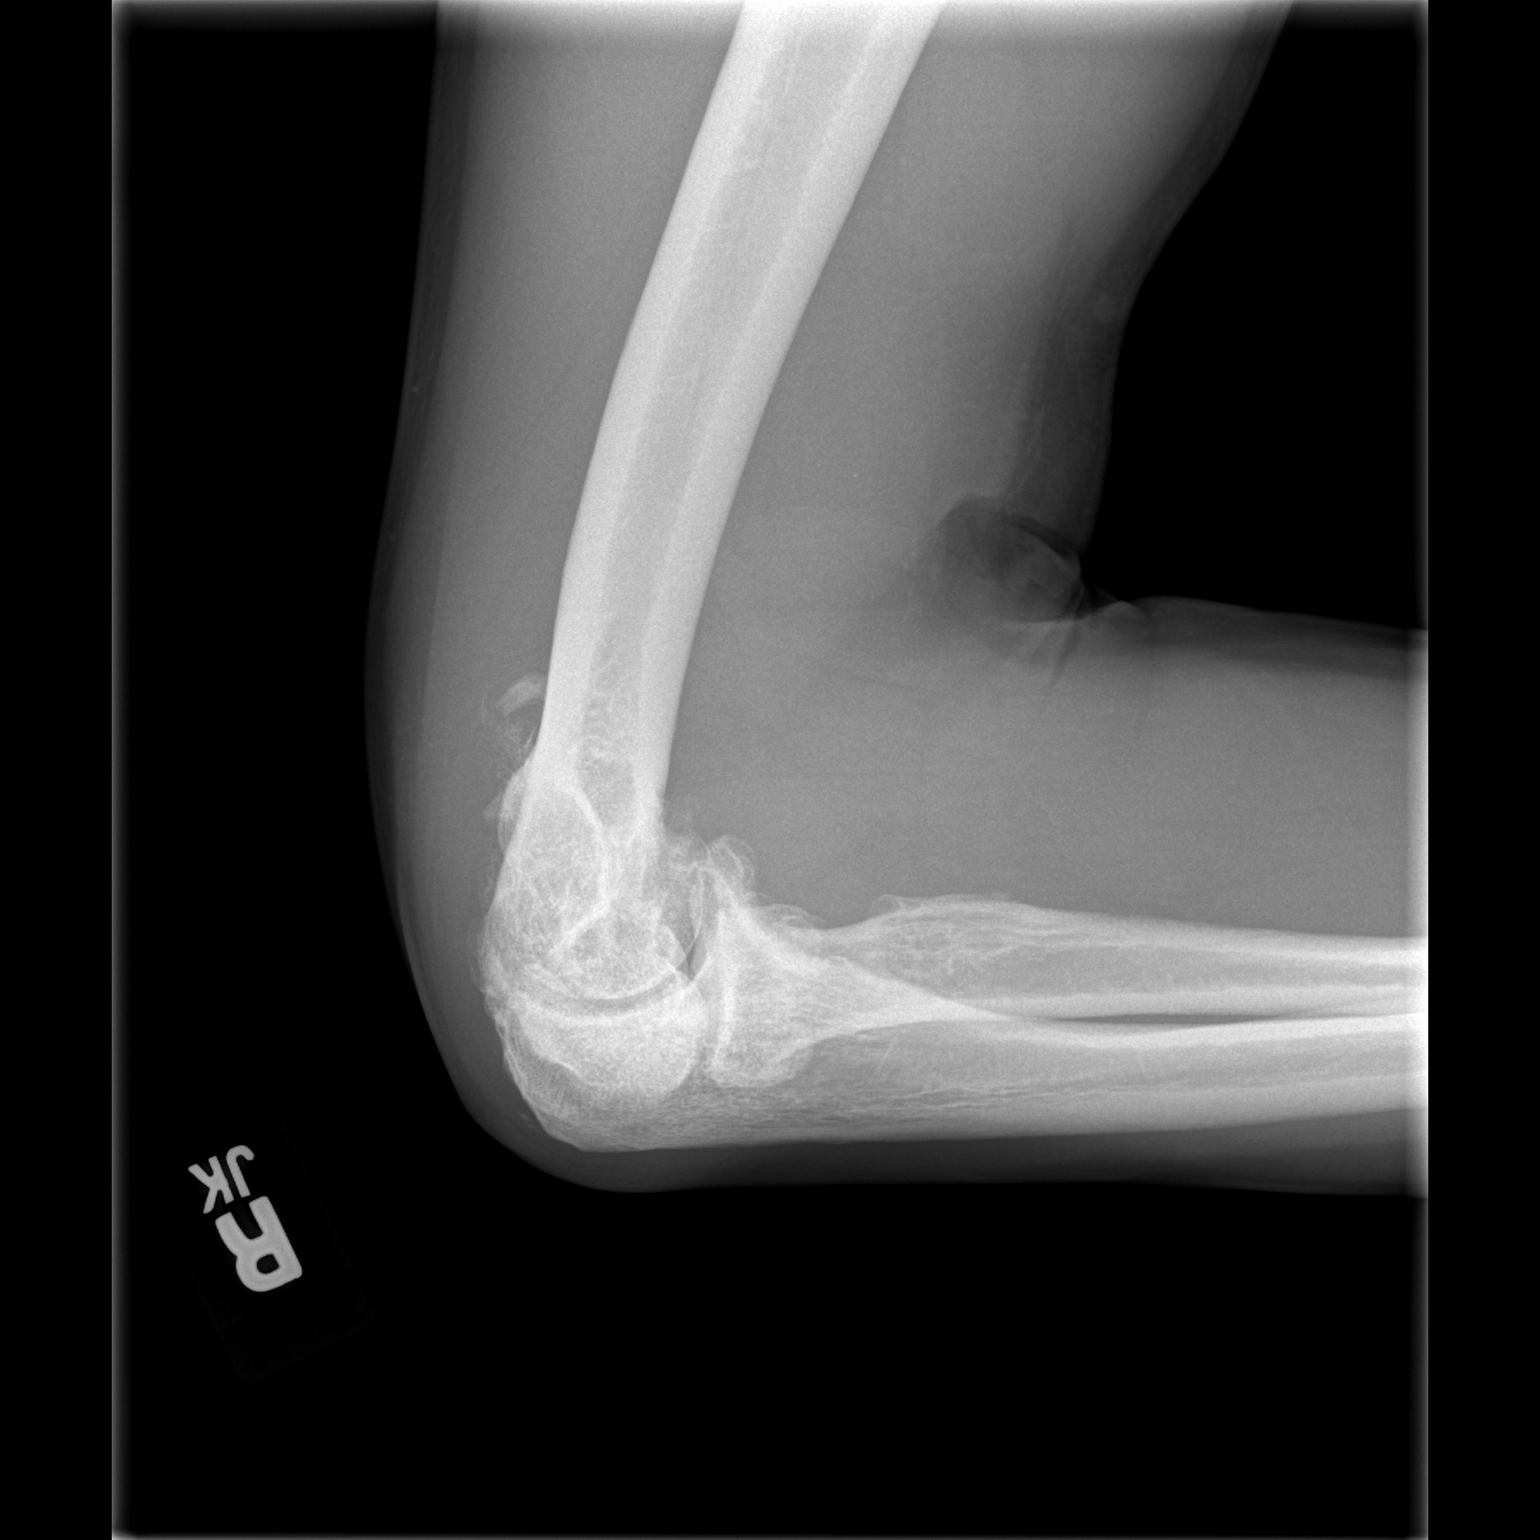

[2 of 2 positions shown; findings below may reference images not displayed]

FINDINGS: No acute fracture or dislocation is evident. Possible joint
effusion. Moderate arthritic changes with exuberant osteophytes and
narrowing present. 1 cm calcification over the soft tissues adjacent
to the distal humerus along the ulnar, posterior aspect.
IMPRESSION: 1. No acute osseous abnormality
2. Possible joint effusion. Moderate arthritic changes of the right
elbow.

## 2016-04-12 MED ORDER — DICLOFENAC SODIUM 75 MG PO TBEC: 75.0000 mg | DELAYED_RELEASE_TABLET | Freq: Two times a day (BID) | ORAL | 1 refills | Status: DC

## 2016-04-12 NOTE — Telephone Encounter (Signed)
Pt is calling because he would like the referral to the place that was discussed on the last visit if the sports medicine didn't work out. Thomas [name]

## 2016-04-13 LAB — C-REACTIVE PROTEIN: CRP: 2 mg/L (ref <8.0)

## 2016-04-13 LAB — ANA: Anti Nuclear Antibody(ANA): NEGATIVE

## 2016-04-13 LAB — SEDIMENTATION RATE: Sed Rate: 4 mm/hr (ref 0–20)

## 2016-04-13 LAB — RHEUMATOID FACTOR: Rhuematoid fact SerPl-aCnc: <14 IU/mL (ref <14)

## 2016-04-13 LAB — CYCLIC CITRUL PEPTIDE ANTIBODY, IGG: Cyclic Citrullin Peptide Ab: 41 Units — ABNORMAL HIGH

## 2016-04-13 MED ORDER — HYDROCODONE-ACETAMINOPHEN 5-325 MG PO TABS: 1.0000 | ORAL_TABLET | Freq: Three times a day (TID) | ORAL | 0 refills | Status: DC | PRN

## 2016-04-13 NOTE — Telephone Encounter (Signed)
Pt would like to speak with Dr. Erin Hearing. Doesn't want to go back to sports medicine. Also, needs to talk about some other stuff. Please call pt at [phone]. Ottis Stain, CMA

## 2016-04-13 NOTE — Telephone Encounter (Signed)
Discussed with him xrays and lab tests  Agreed to write rx for hydrocodone to take only for severe pain along with his usual analgesic regimen  Will put in a referral to pain management for another opinion  He will follow up with me and sports medi.

## 2016-04-13 NOTE — Progress Notes (Signed)
Subjective:    Patient ID: Thomas [name], male    DOB: 1950/10/01, 65 y.o.   MRN: [id number]  HPI chief complaint: Bilateral elbow, bilateral knee, and left wrist pain  Very pleasant 65 year old male comes in today with complaints of pain in multiple joints. He has had pain for several years. 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 In regards to his knee pain, he is status post bilateral total knee replacement done earlier this year by Dr. Alvan Dame. His left knee pain is slowly improving but his right knee pain persists. He localizes it primarily to the medial knee. He has not noticed any swelling. In regards to his elbows, he has had intermittent pain for years but it has become worse since the summer. He notes intermittent swelling and limited range of motion. He takes a combination of 1000 mg of Tylenol and 600 mg of Motrin twice daily and this does seem to help. His left wrist pain is primarily on the dorsum of the wrist and worse with extension. No swelling. No trauma. No numbness or tingling. He has not had any recent imaging of his elbows or his wrist. He denies a history of rheumatological disorders.  Past medical history is reviewed Medications reviewed Allergies reviewed    Review of Systems    as above Objective:   Physical Exam  Well-developed, well-nourished. No acute distress. Awake alert and oriented 3. Vital signs reviewed.  Examination of each elbow shows limited range of motion bilaterally. He has about a 15-20 extension lag bilaterally. He can flex to 115. Mild effusion present bilaterally, left greater than right. They are not warm to touch. No tenderness over the lateral epicondyles or medial epicondyles. No swelling of the olecranon bursa.  Examination of the left wrist shows full flexion but limited extension by about 50%. He is tender to palpation directly in the middle of the dorsum wrist in the area of the scapholunate  ligament and radial carpal joint. No swelling. No effusion. No tenderness in the anatomic snuff box. Good grip. No atrophy. Good pulses.  Examination of each knee shows range of motion from 0 to about 110-120. No effusion. Well-healed surgical incisions .He is tender to palpation diffusely along the medial aspect of the right knee but no tenderness over the IT band, patellar tendon, or quadriceps tendon. There is minimal tenderness to palpation over the medial aspect of the left knee as well.  X-rays of both elbows are obtained. The right elbow x-ray is compared to a previous x-ray from 2015. Right elbow shows moderate degenerative changes with an effusion. There has definitely been progression of his arthritis when compared to his previous film. X-ray of the left elbow shows moderate degenerative changes along with an effusion. X-rays of the left wrist show nothing acute. No effusion. Some mild radiocarpal osteoarthritis.      Assessment & Plan:   Bilateral elbow pain and swelling likely secondary to osteoarthritis Left wrist pain secondary to mild osteoarthritis Status post bilateral total knee arthroplasty in May 2017  Although I believe this patient's degenerative changes are due to osteoarthritis, I would like to get some blood work to rule out any sort of rheumatological process. In the meantime, I would like to try him on 75 mg of Voltaren twice daily. He will take this in lieu of his ibuprofen and along with his 1000 mg of Tylenol. If the Voltaren does not help, he may resume his ibuprofen.  I will call him with the results of his blood work once available. If his knee pain persists, I would recommend that he return to Dr. Alvan Dame. I did talk with the patient about treatment for osteoarthritis of the elbows. Surgical options are not very good. Cortisone injections may provide limited relief. He has obviously been dealing with this for several years and his job is quite physical. I would recommend  that he continue to manage it conservatively if possible.

## 2016-04-13 NOTE — Telephone Encounter (Signed)
Will forward to MD to advise. Jazmin Hartsell,CMA  

## 2016-04-26 ENCOUNTER — Telehealth: Payer: Self-pay | Admitting: Sports Medicine

## 2016-04-26 ENCOUNTER — Other Ambulatory Visit: Payer: Self-pay | Admitting: Family Medicine

## 2016-04-26 NOTE — Telephone Encounter (Signed)
Pt called to let PCP know he got an appointment scheduled for pain management on 05-05-16. He would like a refill on hydrocodone enough to last until that appointment. ep

## 2016-04-26 NOTE — Telephone Encounter (Signed)
I spoke with Thomas [name] on the phone today after reviewing some recent blood work. Majority of his blood work is unremarkable but his anti-CCP came back at 23 which is in the moderately positive range. This could suggest that he has an element of rheumatoid arthritis in addition to his osteoarthritis. Since his last office visit with me, he has been in touch with his PCP and a pain management consultation has been arranged for January 12. I've encouraged him to keep that consultation but to also discuss his blood work results with Dr. Erin Hearing. I will defer the decision for a rheumatology referral to Dr. Erin Hearing and the patient will follow up with me as needed.

## 2016-04-26 NOTE — Telephone Encounter (Signed)
Pt would like a refill on hydrocodone. Pt still has not been able to get into pain management clinic, was told it would be another week or two before they reviewed his case. ep

## 2016-04-26 NOTE — Addendum Note (Signed)
Addended by: Derl Barrow on: 04/26/2016 02:11 PM   Modules accepted: Orders

## 2016-04-28 ENCOUNTER — Telehealth: Payer: Self-pay

## 2016-04-28 MED ORDER — HYDROCODONE-ACETAMINOPHEN 5-325 MG PO TABS: 1.0000 | ORAL_TABLET | Freq: Every day | ORAL | 0 refills | Status: DC | PRN

## 2016-04-28 NOTE — Addendum Note (Signed)
Addended by: Talbert Cage L on: 04/28/2016 09:08 AM   Modules accepted: Orders

## 2016-04-28 NOTE — Telephone Encounter (Addendum)
I printed RX   Please let him know

## 2016-04-28 NOTE — Telephone Encounter (Signed)
Pt informed printed rx left up front for pick up.

## 2016-04-28 NOTE — Telephone Encounter (Signed)
Dr. Erin Hearing gave me rx for pt to come and pick up- pt informed of rx waiting for him at our office.

## 2016-05-09 ENCOUNTER — Encounter: Payer: Self-pay | Admitting: Family Medicine

## 2016-05-09 NOTE — Progress Notes (Signed)
Subjective  Patient is presenting with the following illnesses     Chief Complaint noted Review of Symptoms - see HPI PMH - Smoking status noted.     Objective Vital Signs reviewed     Assessments/Plans  No problem-specific Assessment & Plan notes found for this encounter.   See Encounter view if individual problem A/Ps not visible See after visit summary for details of patient instuctions 

## 2016-05-31 ENCOUNTER — Ambulatory Visit (INDEPENDENT_AMBULATORY_CARE_PROVIDER_SITE_OTHER): Payer: BLUE CROSS/BLUE SHIELD | Admitting: Family Medicine

## 2016-05-31 ENCOUNTER — Encounter: Payer: Self-pay | Admitting: Family Medicine

## 2016-05-31 DIAGNOSIS — M153 Secondary multiple arthritis: Secondary | ICD-10-CM

## 2016-05-31 DIAGNOSIS — R7989 Other specified abnormal findings of blood chemistry: Secondary | ICD-10-CM

## 2016-05-31 DIAGNOSIS — R195 Other fecal abnormalities: Secondary | ICD-10-CM | POA: Diagnosis not present

## 2016-05-31 DIAGNOSIS — R768 Other specified abnormal immunological findings in serum: Secondary | ICD-10-CM | POA: Insufficient documentation

## 2016-05-31 NOTE — Patient Instructions (Addendum)
Check your blood pressure at home if regularly > 140/90 give me a call  I will put in a referral to Rheumatology they will call you for an appointment   You need to get your colonoscopy ASAP

## 2016-05-31 NOTE — Assessment & Plan Note (Signed)
Does not seem to have the stigmata of typical RA.  Will refer to rheumatology

## 2016-05-31 NOTE — Progress Notes (Signed)
Subjective  Patient is presenting with the following illnesses  HYPERTENSION Disease Monitoring  Home BP Monitoring (Severity) is usually controlled at home feels he is irritated to day Symptoms - Chest pain- no    Dyspnea- no Medications (Modifying factors) Compliance-  daily. Lightheadedness-  no  Edema- no Timing - continuous  Duration - years ROS - See HPI  ARTHRITIS Disease monitoring Most involved joints: all over from knees to hips but primarily in wrists and elbows.   Symptoms:  Pain with movement and at the end of the day    Limitations: Impairs his work unless he is taking analgesics.  No rashes or fevers  Medication Management Compliance:  States not tking all the narcotics prescribed by PM   Abdominal pain: no   GI Bleeding:  no    Heme Positive Stool No other symptoms.  Has not noticed bleeding.  No change in bowel movements.  Recognizes could have colon cancer     PMH Lab Review   Potassium  Date Value Ref Range Status  04/05/2016 4.4 3.5 - 5.3 mmol/L Final   Sodium  Date Value Ref Range Status  04/05/2016 139 135 - 146 mmol/L Final   Creat  Date Value Ref Range Status  04/05/2016 0.88 0.70 - 1.25 mg/dL Final    Comment:      For patients > or = 64 years of age: The upper reference limit for Creatinine is approximately 13% higher for people identified as African-American.              Chief Complaint noted Review of Symptoms - see HPI PMH - Smoking status noted.     Objective Vital Signs reviewed Has mild hand deformity at joints consistent with  DJD No hot swollen joints  Skin:  Intact without suspicious lesions or rashes    Assessments/Plans  No problem-specific Assessment & Plan notes found for this encounter.   See Encounter view if individual problem A/Ps not visible See after visit summary for details of patient instuctions

## 2016-06-02 ENCOUNTER — Telehealth: Payer: Self-pay | Admitting: Family Medicine

## 2016-06-02 ENCOUNTER — Other Ambulatory Visit: Payer: BLUE CROSS/BLUE SHIELD

## 2016-06-02 DIAGNOSIS — G8929 Other chronic pain: Secondary | ICD-10-CM

## 2016-06-02 NOTE — Assessment & Plan Note (Signed)
Stable.  He will continue to see pain management.   Urged to be cautious with opiate analgesics and to look at ways to reduce his physical work load

## 2016-06-02 NOTE — Telephone Encounter (Signed)
Pt is going to a pain management clinic and last time urine test came back postive for morphine. Pt states he did not use morphine. Pt went back to pain management today for a repeat urine test. Pt would like to come in today and do a compassion urine screen. Pt feels his samples at pain management have been tampered with. Pt would like to do this today since he had a urine screen at pain management. ep

## 2016-06-02 NOTE — Telephone Encounter (Signed)
Please let him know that I do not think the pain center will take another labs test however if he wants to he can come in and give [place] one today  I ordered a urine drug screen  Thanks  LC

## 2016-06-02 NOTE — Assessment & Plan Note (Signed)
Stable.  Again discussed the importance of following up for a colonoscopy and that he could have colon cancer

## 2016-06-02 NOTE — Telephone Encounter (Signed)
Left message on voicemail that orders had been placed. Asked that patient return call.

## 2016-06-05 LAB — PAIN MGMT, PROFILE 4 CONF W/O MM, U
Amphetamines: NEGATIVE ng/mL (ref <500)
Barbiturates: NEGATIVE ng/mL (ref <300)
Benzodiazepines: NEGATIVE ng/mL (ref <100)
Cocaine Metabolite: NEGATIVE ng/mL (ref <150)
Codeine: NEGATIVE ng/mL (ref <50)
Creatinine: 230.5 mg/dL (ref >=20.0)
Hydrocodone: 4487 ng/mL — ABNORMAL HIGH (ref <50)
Hydromorphone: 628 ng/mL — ABNORMAL HIGH (ref <50)
Methadone Metabolite: NEGATIVE ng/mL (ref <100)
Morphine: NEGATIVE ng/mL (ref <50)
Norhydrocodone: 6967 ng/mL — ABNORMAL HIGH (ref <50)
Opiates: POSITIVE ng/mL — AB (ref <100)
Oxidant: NEGATIVE ug/mL (ref <200)
Oxycodone: NEGATIVE ng/mL (ref <100)
Phencyclidine: NEGATIVE ng/mL (ref <25)
pH: 6.52 (ref 4.5–9.0)

## 2016-06-06 ENCOUNTER — Encounter: Payer: Self-pay | Admitting: Family Medicine

## 2016-06-14 ENCOUNTER — Telehealth: Payer: Self-pay | Admitting: Family Medicine

## 2016-06-14 NOTE — Telephone Encounter (Signed)
Referral has been faxed to [facility] for review and scheduling. They will contact patient directly.

## 2016-06-14 NOTE — Telephone Encounter (Signed)
Pt called and would like to have a referral to see Dr. Deeann Dowse at [facility]. We did a referral for him to see Dr. Charlestine Night, he is not happy with the reviews and what he has heard about this office. Please let patient know when this is done. jw

## 2016-07-06 ENCOUNTER — Encounter: Payer: Self-pay | Admitting: Family Medicine

## 2016-07-06 ENCOUNTER — Other Ambulatory Visit: Payer: Self-pay | Admitting: Family Medicine

## 2016-11-02 ENCOUNTER — Other Ambulatory Visit: Payer: Self-pay | Admitting: Family Medicine

## 2016-11-06 ENCOUNTER — Telehealth: Payer: Self-pay | Admitting: Family Medicine

## 2016-11-06 NOTE — Telephone Encounter (Signed)
Pt states the Viagra has gotten too expensive, pt wanted to know if PCP could write a Rx for pt to pick up in the office for the generic form. ep

## 2016-11-09 MED ORDER — SILDENAFIL CITRATE 20 MG PO TABS: ORAL_TABLET | ORAL | 11 refills | Status: DC

## 2016-11-09 NOTE — Telephone Encounter (Signed)
Rx written for generic viagra which comes in 20 mg tabs Patient can take 2-5 tabs as needed Rx put in front box Please notify him  Thanks

## 2016-11-14 NOTE — Telephone Encounter (Signed)
Patient aware, rx has been picked up.

## 2016-11-28 ENCOUNTER — Ambulatory Visit (INDEPENDENT_AMBULATORY_CARE_PROVIDER_SITE_OTHER): Payer: BLUE CROSS/BLUE SHIELD | Admitting: Podiatry

## 2016-11-28 ENCOUNTER — Encounter: Payer: Self-pay | Admitting: Podiatry

## 2016-11-28 DIAGNOSIS — L89891 Pressure ulcer of other site, stage 1: Secondary | ICD-10-CM | POA: Diagnosis not present

## 2016-11-28 DIAGNOSIS — L84 Corns and callosities: Secondary | ICD-10-CM

## 2016-11-28 MED ORDER — TERBINAFINE HCL 250 MG PO TABS: 250.0000 mg | ORAL_TABLET | Freq: Every day | ORAL | 0 refills | Status: DC

## 2016-11-28 MED ORDER — DOXYCYCLINE HYCLATE 100 MG PO TABS: 100.0000 mg | ORAL_TABLET | Freq: Two times a day (BID) | ORAL | 0 refills | Status: DC

## 2016-11-28 NOTE — Progress Notes (Signed)
He presents today with a chief complaint of pain to the fourth and fifth interdigital space of the right foot. He states that I been soaking this in Epsom salts and warm water and try Lamisil cream but it has not gotten any better.  Objective: Vital signs are stable he is alert and oriented 3. Pulses are strongly palpable right lower extremity evaluation to the webspace of the fourth and fifth digits demonstrates a superficial ulceration with white macerated tissue this is a typical heloma molle. This does not appear to be cellulitic as of yet.  Assessment: Heloma molle right foot.  Plan: Start him on doxycycline and Lamisil. I recommended he discontinue soaking but also suggested that he start placing a light dressing between the toes to keep them separated. I will follow-up with him in 1-2 weeks.

## 2016-12-05 ENCOUNTER — Encounter: Payer: Self-pay | Admitting: Podiatry

## 2016-12-05 ENCOUNTER — Ambulatory Visit (INDEPENDENT_AMBULATORY_CARE_PROVIDER_SITE_OTHER): Payer: BLUE CROSS/BLUE SHIELD | Admitting: Podiatry

## 2016-12-05 DIAGNOSIS — L84 Corns and callosities: Secondary | ICD-10-CM

## 2016-12-05 MED ORDER — DOXYCYCLINE HYCLATE 100 MG PO TABS: 100.0000 mg | ORAL_TABLET | Freq: Two times a day (BID) | ORAL | 0 refills | Status: DC

## 2016-12-05 NOTE — Progress Notes (Signed)
He presents today for follow-up of a superficial infection between the fourth and fifth digits of the right foot. States that he has taken just about all of his antibiotics. He states this seems to be doing better.  Objective: Vital signs are stable alert and oriented 3 denies fever chills nausea vomiting muscle aches and pains. Pulses remain palpable right foot. Heloma molle still present with superficial ulceration medial aspect fifth digit right foot at the level of the PIPJ. This does not probe to bone and there is no signs of infection. He has decreased in intensity as noted. Once in the malodor.  Assessment: Heloma molle with open ulceration. Slowly resolving.  Plan: Refilled his doxycycline debrided the wound today to bleeding place a dressing and I recommended that he continue to do so over the next 3 weeks.

## 2017-01-02 ENCOUNTER — Encounter: Payer: Self-pay | Admitting: Podiatry

## 2017-01-02 ENCOUNTER — Ambulatory Visit (INDEPENDENT_AMBULATORY_CARE_PROVIDER_SITE_OTHER): Payer: BLUE CROSS/BLUE SHIELD | Admitting: Podiatry

## 2017-01-02 DIAGNOSIS — L84 Corns and callosities: Secondary | ICD-10-CM

## 2017-01-02 NOTE — Progress Notes (Signed)
He presents for follow-up of his heloma molle he states that after taking the antibiotics is doing much better. Denies fever chills nausea vomiting. States he continues to dress the wound.  Objective: Vital signs are stable alert and oriented 3. Pulses are palpable. No open lesions or wounds are noted with exception of his very superficial ulceration to the medial aspect of the fifth digit right foot. It appears that the ulceration centrally located in the sulcus has closed. There is still some white macerated skin.  Assessment: Superficial ulceration between fourth and fifth toes right foot. No signs of infection at this point.  Plan: I encouraged him to continue to dress the wound daily I debrided it bleeding today placed antiseptic and a Band-Aid. Follow up with him in 1 month if necessary.

## 2017-01-06 ENCOUNTER — Other Ambulatory Visit: Payer: Self-pay | Admitting: Family Medicine

## 2017-01-26 ENCOUNTER — Ambulatory Visit (INDEPENDENT_AMBULATORY_CARE_PROVIDER_SITE_OTHER): Payer: BLUE CROSS/BLUE SHIELD | Admitting: Podiatry

## 2017-01-26 ENCOUNTER — Encounter: Payer: Self-pay | Admitting: Podiatry

## 2017-01-26 ENCOUNTER — Ambulatory Visit (INDEPENDENT_AMBULATORY_CARE_PROVIDER_SITE_OTHER): Payer: BLUE CROSS/BLUE SHIELD

## 2017-01-26 ENCOUNTER — Other Ambulatory Visit: Payer: Self-pay | Admitting: Podiatry

## 2017-01-26 DIAGNOSIS — M79674 Pain in right toe(s): Secondary | ICD-10-CM

## 2017-01-26 DIAGNOSIS — M86171 Other acute osteomyelitis, right ankle and foot: Secondary | ICD-10-CM | POA: Diagnosis not present

## 2017-01-26 DIAGNOSIS — L02611 Cutaneous abscess of right foot: Secondary | ICD-10-CM

## 2017-01-26 DIAGNOSIS — L03031 Cellulitis of right toe: Secondary | ICD-10-CM

## 2017-01-26 MED ORDER — CIPROFLOXACIN HCL 500 MG PO TABS: 500.0000 mg | ORAL_TABLET | Freq: Two times a day (BID) | ORAL | 0 refills | Status: DC

## 2017-01-26 MED ORDER — CEPHALEXIN 500 MG PO CAPS: 500.0000 mg | ORAL_CAPSULE | Freq: Three times a day (TID) | ORAL | 1 refills | Status: DC

## 2017-01-26 NOTE — Progress Notes (Signed)
Subjective:    Patient ID: Thomas [name], male   DOB: 66 y.o.   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 The patient's right fourth interspace does have keratotic tissue formation and irritation and is quite a bit of swelling of the fifth toe with redness which does extend several centimeters proximal to the metatarsal phalangeal joint is localized with no increased warmth in the midfoot or ankle or leg     Assessment:    Probability for chronic infection of this area with bacterial infection which is occurred which may be due to the opening the skin from fungal infection with possibility of bone trauma fifth digit     Plan:    H&P x-rays reviewed. At this point I did a proximal nerve block of the area did sterile prep using sterile his mentation debrided tissue from the right fifth and fourth toe to create a channel for drainage did culture of the right fifth digit and did not note bone exposure when I cultured the interspace and I discussed with him that he probably does have bone infection based on the x-ray and that in the end he may require amputation of the fifth digit. I placed him on Cipro and cephalexin and I advised on soaks and monitoring his temperature daily if any increase should occur he is to go straight to the emergency room and we will see him back again in 1 week or earlier if needed  X-rays indicate lysis of the head of the proximal phalanx digit 5 right

## 2017-01-30 ENCOUNTER — Ambulatory Visit: Payer: BLUE CROSS/BLUE SHIELD | Admitting: Podiatry

## 2017-01-30 LAB — WOUND CULTURE
MICRO NUMBER:: 81110823
SPECIMEN QUALITY:: ADEQUATE

## 2017-02-01 ENCOUNTER — Encounter: Payer: Self-pay | Admitting: Podiatry

## 2017-02-01 ENCOUNTER — Ambulatory Visit (INDEPENDENT_AMBULATORY_CARE_PROVIDER_SITE_OTHER): Payer: BLUE CROSS/BLUE SHIELD | Admitting: Podiatry

## 2017-02-01 VITALS — BP 160/90 | HR 67 | Resp 16

## 2017-02-01 DIAGNOSIS — L03031 Cellulitis of right toe: Secondary | ICD-10-CM

## 2017-02-01 DIAGNOSIS — M79674 Pain in right toe(s): Secondary | ICD-10-CM

## 2017-02-01 DIAGNOSIS — L02611 Cutaneous abscess of right foot: Secondary | ICD-10-CM

## 2017-02-01 NOTE — Progress Notes (Signed)
Subjective:    Patient ID: Thomas [name], male   DOB: 66 y.o.   MRN: [id number]   HPI patient states he seems to be doing much better with no drainage or pain    ROS      Objective:  Physical Exam neurovascular status intact with patient's fourth interspace right improving with no active drainage noted continued edema in the fifth digit and no pain     Assessment:   Strong probability that there still ostial mildly changes fifth digit right but at this time infection seems resolved      Plan:    I explained in great detail that it one point in future most likely it is going to require either digital amputation or bone resection but at this time is doing better and we'll see how he responds and he should get any redness drainage or other pathology she will let us know immediately

## 2017-02-07 ENCOUNTER — Other Ambulatory Visit: Payer: Self-pay | Admitting: Podiatry

## 2017-02-07 MED ORDER — SULFAMETHOXAZOLE-TRIMETHOPRIM 800-160 MG PO TABS: 1.0000 | ORAL_TABLET | Freq: Two times a day (BID) | ORAL | 0 refills | Status: DC

## 2017-02-08 ENCOUNTER — Telehealth: Payer: Self-pay | Admitting: *Deleted

## 2017-02-08 ENCOUNTER — Telehealth: Payer: Self-pay

## 2017-02-08 MED ORDER — SULFAMETHOXAZOLE-TRIMETHOPRIM 800-160 MG PO TABS: 1.0000 | ORAL_TABLET | Freq: Two times a day (BID) | ORAL | 0 refills | Status: DC

## 2017-02-08 NOTE — Telephone Encounter (Signed)
Spoke with patient advising him to stop taking his current antibiotic therapy, and start taking Bactrim DS, BID X 10 days, if symptoms do no improve he is to follow up

## 2017-02-08 NOTE — Telephone Encounter (Addendum)
-----   Message from Wallene Huh, DPM sent at 02/07/2017  4:33 PM EDT ----- Start on Bactrim DS 1 tablet BID x 10 days, I called the patient and left a voicemail for him to return my call for new orders/ change in antibiotic therapy based on wound culture results. 02/08/2017-Left message stating due to the importance of the message I would leave it on his voicemail and informed pt of the orders from Dr. Paulla Dolly, and that the medication had been sent to [place].

## 2017-04-04 ENCOUNTER — Telehealth: Payer: Self-pay | Admitting: Podiatry

## 2017-04-04 ENCOUNTER — Telehealth: Payer: Self-pay | Admitting: *Deleted

## 2017-04-04 NOTE — Telephone Encounter (Signed)
Pt states his rheumatologist would like to know when he can go back on his arthritis medications after completing the terbinafine.

## 2017-04-04 NOTE — Telephone Encounter (Addendum)
I spoke with pt and he was on Bactrim DS 02/07/2017, and has been off of the methotrexate and Embrel since then. I told pt I would inform Dr. Paulla Dolly the Bactrim DS had been prescribed for 10 days and completed on or near 02/17/2017 and call again with the information and fax a note to his rheumatologist. Pt states he sees Dr. [name] with [facility]. Dr. Paulla Dolly states pt is okay to return to arthritis therapy.

## 2017-04-04 NOTE — Telephone Encounter (Signed)
I'm a pt of Dr. Mellody Drown and I have not been seen since 11 October. I need him to approve my rheumatologist to start me back on my medications for my rheumatoid arthritis. I didn't know if I needed to come in or just speak to him. If you could please give me a call back at [phone]. Thank you.

## 2017-04-05 ENCOUNTER — Encounter: Payer: Self-pay | Admitting: *Deleted

## 2017-04-05 NOTE — Telephone Encounter (Signed)
Left message informing pt Dr. Paulla Dolly had okayed pt to resume his arthritis therapy. Faxed letter to Dr. [name] [facility].

## 2017-04-06 ENCOUNTER — Other Ambulatory Visit: Payer: Self-pay | Admitting: Family Medicine

## 2017-04-24 DIAGNOSIS — C61 Malignant neoplasm of prostate: Secondary | ICD-10-CM

## 2017-04-24 HISTORY — DX: Malignant neoplasm of prostate: C61

## 2017-06-25 ENCOUNTER — Encounter: Payer: Self-pay | Admitting: Internal Medicine

## 2017-06-25 ENCOUNTER — Ambulatory Visit: Payer: BLUE CROSS/BLUE SHIELD | Admitting: Internal Medicine

## 2017-06-25 DIAGNOSIS — M545 Low back pain, unspecified: Secondary | ICD-10-CM

## 2017-06-25 MED ORDER — MELOXICAM 15 MG PO TABS: 15.0000 mg | ORAL_TABLET | Freq: Every day | ORAL | 0 refills | Status: DC

## 2017-06-25 NOTE — Patient Instructions (Signed)
It was nice meeting you today Thomas [name]!  Please begin taking one Mobic tablet daily for the next two weeks. If your pain does not start to improve within this time, please let us know. Do not take ibuprofen, naproxen, or any products containing either of these two medications while you are taking Mobic. You can also try putting a heating pad or ice pack (whichever feels better) on your back/side if it feels sore.   If you have any questions or concerns, please feel free to call the clinic.   Be well,  Dr. Avon Gully

## 2017-06-25 NOTE — Progress Notes (Signed)
   Subjective:   Patient: Thomas [name]       Birthdate: [birth year]       MRN: [id number]      HPI  Thomas [name] is a 67 y.o. male presenting for same day appt for R sided back pain.   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 He initially thought the pain was due to this, however one week ago, he had a more severe pain when bending over while working on scaffolding. Pain passed after a few seconds, then occurred again the next day when working on scaffolding, though was worse. Describes the pain as debilitating for a few seconds and feeling sharp like lightning. Has been occurring intermittently since that time, even without activity. Is able to sleep at night as long as he lays in certain positions. Pain is located only in the R lateral lumbar region and does not radiate. Denies numbness, tingling, weakness in his legs. Has been taking ibuprofen which does help with pain.   Smoking status reviewed. Patient is never smoker.   Review of Systems See HPI.     Objective:  Physical Exam  Constitutional: He is oriented to person, place, and time and well-developed, well-nourished, and in no distress.  HENT:  Head: Normocephalic and atraumatic.  Pulmonary/Chest: Effort normal. No respiratory distress.  Musculoskeletal:  TTP of R lateral lumbar region. Able to walk, sit, and stand without difficulty. Full back ROM. 5/5 strength LE bilaterally.   Neurological: He is alert and oriented to person, place, and time.  Skin: Skin is warm and dry.  Psychiatric: Affect and judgment normal.      Assessment & Plan:  Back pain Acute onset. Sharp nature of pain more consistent with neuropathic etiology, however TTP more consistent with MSK. Possibly combination of both. Sleeping in uncomfortable bed as well as frequent bending and working on scaffolding likely cause/contributors.  Is not interfering with his normal daily activities, and no difficulties walking, sitting, or standing on exam today. Will treat conservatively with Mobic x2w as ibuprofen is already helping him currently. If no improvement and worsening of seemingly neuropathic pain, could consider gabapentin or a similar agent, however feel that he should improve with time.    Adin Hector, MD, MPH PGY-3 [facility] Pager [phone]

## 2017-06-25 NOTE — Assessment & Plan Note (Signed)
Acute onset. Sharp nature of pain more consistent with neuropathic etiology, however TTP more consistent with MSK. Possibly combination of both. Sleeping in uncomfortable bed as well as frequent bending and working on scaffolding likely cause/contributors. Is not interfering with his normal daily activities, and no difficulties walking, sitting, or standing on exam today. Will treat conservatively with Mobic x2w as ibuprofen is already helping him currently. If no improvement and worsening of seemingly neuropathic pain, could consider gabapentin or a similar agent, however feel that he should improve with time.

## 2017-07-04 ENCOUNTER — Telehealth: Payer: Self-pay | Admitting: Family Medicine

## 2017-07-04 NOTE — Telephone Encounter (Signed)
Pt is have a prostate biopsy in two weeks at [facility] with [name] and will schedule getting a colonoscopy after his results return. Getting through the surgery is his main focus, currently.  Pt said that he would like a referral to [facility] when he is ready for the colonoscopy.

## 2017-07-08 ENCOUNTER — Other Ambulatory Visit: Payer: Self-pay | Admitting: Internal Medicine

## 2017-07-14 ENCOUNTER — Other Ambulatory Visit: Payer: Self-pay | Admitting: Family Medicine

## 2017-07-26 ENCOUNTER — Other Ambulatory Visit: Payer: Self-pay | Admitting: Family Medicine

## 2017-07-31 ENCOUNTER — Other Ambulatory Visit: Payer: Self-pay | Admitting: Family Medicine

## 2017-08-26 ENCOUNTER — Other Ambulatory Visit: Payer: Self-pay | Admitting: Family Medicine

## 2017-09-05 ENCOUNTER — Other Ambulatory Visit: Payer: Self-pay | Admitting: Family Medicine

## 2017-09-09 ENCOUNTER — Other Ambulatory Visit: Payer: Self-pay | Admitting: Family Medicine

## 2017-09-26 ENCOUNTER — Other Ambulatory Visit: Payer: Self-pay | Admitting: Family Medicine

## 2017-09-26 NOTE — Telephone Encounter (Signed)
Appt on 10/02/17. Fleeger, Salome Spotted, CMA

## 2017-10-02 ENCOUNTER — Ambulatory Visit (INDEPENDENT_AMBULATORY_CARE_PROVIDER_SITE_OTHER): Payer: BLUE CROSS/BLUE SHIELD | Admitting: Family Medicine

## 2017-10-02 ENCOUNTER — Other Ambulatory Visit: Payer: Self-pay

## 2017-10-02 ENCOUNTER — Encounter: Payer: Self-pay | Admitting: Family Medicine

## 2017-10-02 DIAGNOSIS — I10 Essential (primary) hypertension: Secondary | ICD-10-CM | POA: Diagnosis not present

## 2017-10-02 DIAGNOSIS — G4700 Insomnia, unspecified: Secondary | ICD-10-CM

## 2017-10-02 DIAGNOSIS — R195 Other fecal abnormalities: Secondary | ICD-10-CM | POA: Diagnosis not present

## 2017-10-02 MED ORDER — LISINOPRIL 20 MG PO TABS: 20.0000 mg | ORAL_TABLET | Freq: Every day | ORAL | 1 refills | Status: DC

## 2017-10-02 NOTE — Progress Notes (Signed)
Subjective  Thomas [name] is a 67 y.o. male is presenting with the following  HYPERTENSION Disease Monitoring  Home BP Monitoring (Severity) usually under control at Drs visits but does not monitor at home Symptoms - Chest pain- no    Dyspnea- no Medications (Modifying factors) Compliance-  Daily lisinopril. Lightheadedness-  no  Edema- no Timing - continuous  Duration - years ROS - See HPI  INSOMNIA Has had insomnia for several months. Trouble getting to sleep: no more getting up after a few hours and not getting back to sleep Naps during the day: no Medications tried: OTC benadryl and melatonin New Medications: no  Symptoms Sweating at night: no Weight loss: mild  Shortness of breath or hemoptysis: no Muscle pain or weakness: no Severe snoring or daytime sleepiness: no Feeling down or depressed: stressed with family and recent prostate ca diagnosis Loss of enjoyment: mild Leg or joint swelling: arthritis is under control   HEME POSITIVE STOOL Still has not had colonoscopy.  No bleeding or lightheadness   ROS see HPI Smoking Status noted   PMH Lab Review   Potassium  Date Value Ref Range Status  04/05/2016 4.4 3.5 - 5.3 mmol/L Final   Sodium  Date Value Ref Range Status  04/05/2016 139 135 - 146 mmol/L Final   Creat  Date Value Ref Range Status  04/05/2016 0.88 0.70 - 1.25 mg/dL Final    Comment:      For patients > or = 65 years of age: The upper reference limit for Creatinine is approximately 13% higher for people identified as African-American.          Chief Complaint noted Review of Symptoms - see HPI PMH - Smoking status noted.    Objective Vital Signs reviewed BP 140/78   Pulse 75   Temp 97.9 F (36.6 C) (Oral)   Ht 6\' 8"  (2.032 m)   Wt 249 lb 3.2 oz (113 kg)   SpO2 96%   BMI 27.38 kg/m  Heart - Regular rate and rhythm.  No murmurs, gallops or rubs.    Lungs:  Normal respiratory effort, chest expands symmetrically. Lungs are clear to  auscultation, no crackles or wheezes. Extremities:  No cyanosis, edema, or deformity noted with good range of motion of all major joints.   Psych:  Cognition and judgment appear intact. Alert, communicative  and cooperative with normal attention span and concentration. No apparent delusions, illusions, hallucinations  Assessments/Plans  See after visit summary for details of patient instuctions  Hypertension At goal. Check labs   Insomnia Likely stress (recent diagnosis of prostate cancer) and  related with age related BPH symptoms.  Possible depression.  He will return for further discussion if does not improve   Heme positive stool Discussed the possibility of cancer and need for evaluation

## 2017-10-02 NOTE — Assessment & Plan Note (Signed)
Likely stress (recent diagnosis of prostate cancer) and  related with age related BPH symptoms.  Possible depression.  He will return for further discussion if does not improve

## 2017-10-02 NOTE — Assessment & Plan Note (Signed)
At goal.  Check labs. 

## 2017-10-02 NOTE — Patient Instructions (Signed)
Good to see you today!  Thanks for coming in.  I will call you if your tests are not good.  Otherwise I will send you a letter.  If you do not hear from me with in 2 weeks please call our office.     Your goal blood pressure should be below 150/90 then let me know  If the sleep or stress is not improving come back and we can discuss further  See your gastroenterologist

## 2017-10-02 NOTE — Assessment & Plan Note (Signed)
Discussed the possibility of cancer and need for evaluation

## 2017-10-03 ENCOUNTER — Telehealth: Payer: Self-pay

## 2017-10-03 ENCOUNTER — Encounter: Payer: Self-pay | Admitting: Family Medicine

## 2017-10-03 LAB — BASIC METABOLIC PANEL
BUN/Creatinine Ratio: 20 (ref 10–24)
BUN: 16 mg/dL (ref 8–27)
CO2: 25 mmol/L (ref 20–29)
Calcium: 9.4 mg/dL (ref 8.6–10.2)
Chloride: 103 mmol/L (ref 96–106)
Creatinine, Ser: 0.8 mg/dL (ref 0.76–1.27)
GFR calc Af Amer: 109 mL/min/{1.73_m2} (ref >59)
GFR calc non Af Amer: 94 mL/min/{1.73_m2} (ref >59)
Glucose: 86 mg/dL (ref 65–99)
Potassium: 4.1 mmol/L (ref 3.5–5.2)
Sodium: 143 mmol/L (ref 134–144)

## 2017-10-03 NOTE — Telephone Encounter (Signed)
Received call on nurse line from pt requesting a rx for Trazadone. Pt stated he spoke with you about this briefly at his OV earlier this week. Pt would like a "small" rx sent to his [facility]. Please advise.

## 2017-10-04 MED ORDER — TRAZODONE HCL 50 MG PO TABS: 25.0000 mg | ORAL_TABLET | Freq: Every evening | ORAL | 0 refills | Status: DC | PRN

## 2017-10-04 NOTE — Telephone Encounter (Signed)
Pt informed and appreciative. Deseree Kennon Holter, CMA

## 2017-10-04 NOTE — Addendum Note (Signed)
Addended by: Talbert Cage L on: 10/04/2017 09:03 AM   Modules accepted: Orders

## 2017-10-04 NOTE — Telephone Encounter (Signed)
Pls let him know I sent in a Rx for 10 pills.  We will need to see him back for a better discussion and to make sure he is on the best therapy  Thanks  Hand

## 2017-10-07 ENCOUNTER — Other Ambulatory Visit: Payer: Self-pay | Admitting: Family Medicine

## 2017-10-24 ENCOUNTER — Other Ambulatory Visit: Payer: Self-pay | Admitting: Family Medicine

## 2017-11-05 ENCOUNTER — Other Ambulatory Visit: Payer: Self-pay | Admitting: Family Medicine

## 2017-11-26 ENCOUNTER — Other Ambulatory Visit: Payer: Self-pay | Admitting: *Deleted

## 2017-11-26 NOTE — Telephone Encounter (Signed)
Patient is requesting a refill of his trazodone for sleep.  States it was denied 2 weeks ago, but he has now stopped taking hydrocodone.  Not able to sleep and needing something to help him sleep.  Will route request to PCP.  Burna Forts, BSN, RN-BC

## 2017-11-30 DIAGNOSIS — G894 Chronic pain syndrome: Secondary | ICD-10-CM | POA: Diagnosis not present

## 2017-11-30 DIAGNOSIS — Z79899 Other long term (current) drug therapy: Secondary | ICD-10-CM | POA: Diagnosis not present

## 2017-11-30 DIAGNOSIS — Z79891 Long term (current) use of opiate analgesic: Secondary | ICD-10-CM | POA: Diagnosis not present

## 2017-11-30 DIAGNOSIS — M25569 Pain in unspecified knee: Secondary | ICD-10-CM | POA: Diagnosis not present

## 2017-11-30 DIAGNOSIS — M069 Rheumatoid arthritis, unspecified: Secondary | ICD-10-CM | POA: Diagnosis not present

## 2017-12-28 DIAGNOSIS — Z79899 Other long term (current) drug therapy: Secondary | ICD-10-CM | POA: Diagnosis not present

## 2017-12-28 DIAGNOSIS — Z8546 Personal history of malignant neoplasm of prostate: Secondary | ICD-10-CM | POA: Diagnosis not present

## 2017-12-28 DIAGNOSIS — M25569 Pain in unspecified knee: Secondary | ICD-10-CM | POA: Diagnosis not present

## 2017-12-28 DIAGNOSIS — M069 Rheumatoid arthritis, unspecified: Secondary | ICD-10-CM | POA: Diagnosis not present

## 2017-12-28 DIAGNOSIS — Z79891 Long term (current) use of opiate analgesic: Secondary | ICD-10-CM | POA: Diagnosis not present

## 2017-12-28 DIAGNOSIS — G894 Chronic pain syndrome: Secondary | ICD-10-CM | POA: Diagnosis not present

## 2018-01-15 ENCOUNTER — Telehealth: Payer: Self-pay | Admitting: Radiation Oncology

## 2018-01-15 NOTE — Progress Notes (Signed)
GU Location of Tumor / Histology: prostatic adenocarcinoma  If Prostate Cancer, Gleason Score is (3 + 4) and PSA is (5.43). Prostate volume: 40 grams   Thomas [name] has been seen by [name] in the management of low testosterone. PSA has been climbing since 2017. Prostate biopsy performed 09/21/2017.  Biopsies of prostate (if applicable) revealed:    Past/Anticipated interventions by urology, if any: prostate biopsy, referral to radiation oncology  Past/Anticipated interventions by medical oncology, if any: no  Weight changes, if any: no  Bowel/Bladder complaints, if any: IPSS 13. Patient refused to complete SHIM stating "it was none of my damn business." Denies dysuria, hematuria, urinary leakage or incontinence.    Nausea/Vomiting, if any: no  Pain issues, if any:  Chronic pain related to RA. Reports increasing pain in buttock and upper back related to RA. Reports managing pain with oxycodone/acetaminophen 10/325.   SAFETY ISSUES:  Prior radiation? no  Pacemaker/ICD? no  Possible current pregnancy? no  Is the patient on methotrexate? no  Current Complaints / other details:  67 year old male. Married. Strong family hx of prostate cancer (father and brother). Denies breast, colon or pancreatic cancer in his family.  HX of inguinal hernia repair. Leaning toward seeds but open minded about treatment options. Works full time. Resides in [place].

## 2018-01-15 NOTE — Progress Notes (Signed)
Received voicemail message inquiring about his copay for tomorrow's visit. Phoned patient back. No answer. Left a voicemail explaining that we have no insurance information on him since his BCBS was termed recently. Encouraged him to call Jodelle Green with his insurance information. Provided patient with Meredith's contact number.

## 2018-01-16 ENCOUNTER — Ambulatory Visit
Admission: RE | Admit: 2018-01-16 | Discharge: 2018-01-16 | Disposition: A | Discharge status: Home / Self Care | Payer: Medicare Other | Source: Ambulatory Visit | Attending: Radiation Oncology | Admitting: Radiation Oncology

## 2018-01-16 ENCOUNTER — Other Ambulatory Visit: Payer: Self-pay

## 2018-01-16 DIAGNOSIS — Z79899 Other long term (current) drug therapy: Secondary | ICD-10-CM | POA: Diagnosis not present

## 2018-01-16 DIAGNOSIS — R972 Elevated prostate specific antigen [PSA]: Secondary | ICD-10-CM | POA: Diagnosis not present

## 2018-01-16 DIAGNOSIS — I10 Essential (primary) hypertension: Secondary | ICD-10-CM | POA: Diagnosis not present

## 2018-01-16 DIAGNOSIS — E291 Testicular hypofunction: Secondary | ICD-10-CM | POA: Diagnosis not present

## 2018-01-16 DIAGNOSIS — Z96653 Presence of artificial knee joint, bilateral: Secondary | ICD-10-CM | POA: Insufficient documentation

## 2018-01-16 DIAGNOSIS — C61 Malignant neoplasm of prostate: Secondary | ICD-10-CM

## 2018-01-16 DIAGNOSIS — Z8042 Family history of malignant neoplasm of prostate: Secondary | ICD-10-CM | POA: Diagnosis not present

## 2018-01-16 NOTE — Progress Notes (Signed)
Radiation Oncology         [phone] ________________________________  Initial outpatient Consultation  Name: Thomas [name] MRN: [id number]  Date: 01/16/2018  DOB: 08/14/1950  CC:Chambliss, Marshall L, MD  Herrick, Benjamin W, MD   REFERRING PHYSICIAN: Herrick, Benjamin W, MD  DIAGNOSIS: 67 y.o. gentleman with Stage T1c adenocarcinoma of the prostate with Gleason Score of 3+4, and PSA of 5.43.    ICD-10-CM   1. Malignant neoplasm of prostate (HCC) C61 Ambulatory referral to Social Work    HISTORY OF PRESENT ILLNESS: Thomas [name] is a 67 y.o. male with a diagnosis of prostate cancer. He is an established patient in urology followed by Dr. Herrick for hypogonadism.  He was started on testosterone replacement therapy and was noted to have a PSA elevation up to 5.65 on 11/20/2016.  A follow-up PSA remained elevated at 4.44 on 01/08/2017 and was 5.09 on 02/20/2017.  Therefore, testosterone replacement therapy was discontinued and a follow-up PSA off testosterone replacement on 03/23/2017 remained elevated at 5.43. Digital rectal examination remained without any discrete nodularity.  The patient proceeded to transrectal ultrasound with 12 biopsies of the prostate on 09/21/17.  The prostate volume measured 40 grams.  Out of 12 core biopsies, 5 were positive.  The maximum Gleason score was 3+4, and this was seen in left base.  Additionally, Gleason 3+3 disease was noted in the left base lateral, right base, right base lateral and right mid lateral.  He has a strong family history of prostate cancer.  The patient reviewed the biopsy results with his urologist and he has kindly been referred today for discussion of potential radiation treatment options.  PREVIOUS RADIATION THERAPY: No  PAST MEDICAL HISTORY:  Past Medical History:  Diagnosis Date  . Hypertension   . Left knee DJD    Xray 12/23/08      PAST SURGICAL HISTORY: Past Surgical History:  Procedure Laterality Date  . HERNIA  REPAIR    . TOTAL KNEE ARTHROPLASTY Bilateral 09/13/2015   Procedure: BILATERAL KNEE ARTHROPLASTY ;  Surgeon:  Olin, MD;  Location: [facility];  Service: Orthopedics;  Laterality: Bilateral;    FAMILY HISTORY:  Family History  Problem Relation Age of Onset  . Prostate cancer Father   . Prostate cancer Brother   . Colon cancer Neg Hx   . Rectal cancer Neg Hx   . Stomach cancer Neg Hx     SOCIAL HISTORY:  Social History   Socioeconomic History  . Marital status: Married    Spouse name: Not on file  . Number of children: Not on file  . Years of education: Not on file  . Highest education level: Not on file  Occupational History  . Not on file  Social Needs  . Financial resource strain: Not on file  . Food insecurity:    Worry: Not on file    Inability: Not on file  . Transportation needs:    Medical: Not on file    Non-medical: Not on file  Tobacco Use  . Smoking status: Never Smoker  . Smokeless tobacco: Never Used  Substance and Sexual Activity  . Alcohol use: No    Alcohol/week: 0.0 standard drinks  . Drug use: No  . Sexual activity: Not on file  Lifestyle  . Physical activity:    Days per week: Not on file    Minutes per session: Not on file  . Stress: Not on file  Relationships  . Social connections:      Talks on phone: Not on file    Gets together: Not on file    Attends religious service: Not on file    Active member of club or organization: Not on file    Attends meetings of clubs or organizations: Not on file    Relationship status: Not on file  . Intimate partner violence:    Fear of current or ex partner: Not on file    Emotionally abused: Not on file    Physically abused: Not on file    Forced sexual activity: Not on file  Other Topics Concern  . Not on file  Social History Narrative  . Not on file    ALLERGIES: Chlorthalidone  MEDICATIONS:  Current Outpatient Medications  Medication Sig Dispense Refill  . HYDROcodone-acetaminophen  (NORCO) 10-325 MG tablet Take 1 tablet by mouth every 6 (six) hours as needed.    . lisinopril (PRINIVIL,ZESTRIL) 20 MG tablet Take 1 tablet (20 mg total) by mouth daily. 90 tablet 1  . Multiple Vitamin (MULTIVITAMIN WITH MINERALS) TABS tablet Take 1 tablet by mouth daily.    . sildenafil (REVATIO) 20 MG tablet Take 20 mg by mouth 3 (three) times daily.    . meloxicam (MOBIC) 15 MG tablet TAKE 1 TABLET BY MOUTH EVERY DAY (Patient not taking: Reported on 01/16/2018) 90 tablet 1  . traZODone (DESYREL) 50 MG tablet Take 0.5-1 tablets (25-50 mg total) by mouth at bedtime as needed for sleep. No Further Refills before an Office visit (Patient not taking: Reported on 01/16/2018) 10 tablet 0   No current facility-administered medications for this encounter.     REVIEW OF SYSTEMS:  On review of systems, the patient reports that he is doing well overall. He denies any chest pain, shortness of breath, cough, fevers, chills, night sweats, unintended weight changes. He denies any bowel disturbances, and denies abdominal pain, nausea or vomiting. He denies any new musculoskeletal or joint aches or pains. He reports chronic pain related to arthritis. His IPSS was 13, indicating moderate urinary symptoms. He refused to complete the SHIM form. A complete review of systems is obtained and is otherwise negative.    PHYSICAL EXAM:  Wt Readings from Last 3 Encounters:  01/16/18 249 lb 9.6 oz (113.2 kg)  10/02/17 249 lb 3.2 oz (113 kg)  06/25/17 250 lb 12.8 oz (113.8 kg)   Temp Readings from Last 3 Encounters:  01/16/18 98.1 F (36.7 C) (Oral)  10/02/17 97.9 F (36.6 C) (Oral)  06/25/17 97.7 F (36.5 C) (Oral)   BP Readings from Last 3 Encounters:  01/16/18 127/75  10/02/17 140/78  06/25/17 130/80   Pulse Readings from Last 3 Encounters:  01/16/18 84  10/02/17 75  06/25/17 67   Pain Assessment Pain Score: 0-No pain/10  In general this is a well appearing Caucasian gentleman in no acute distress. He  is alert and oriented x4 and appropriate throughout the examination. HEENT reveals that the patient is normocephalic, atraumatic. EOMs are intact. PERRLA. Skin is intact without any evidence of gross lesions. Cardiovascular exam reveals a regular rate and rhythm, no clicks rubs or murmurs are auscultated. Chest is clear to auscultation bilaterally. Lymphatic assessment is performed and does not reveal any adenopathy in the cervical, supraclavicular, axillary, or inguinal chains. Abdomen has active bowel sounds in all quadrants and is intact. The abdomen is soft, non tender, non distended. Lower extremities are negative for pretibial pitting edema, deep calf tenderness, cyanosis or clubbing.   KPS = 100  100 -   Normal; no complaints; no evidence of disease. 90   - Able to carry on normal activity; minor signs or symptoms of disease. 80   - Normal activity with effort; some signs or symptoms of disease. 70   - Cares for self; unable to carry on normal activity or to do active work. 60   - Requires occasional assistance, but is able to care for most of his personal needs. 50   - Requires considerable assistance and frequent medical care. 40   - Disabled; requires special care and assistance. 30   - Severely disabled; hospital admission is indicated although death not imminent. 20   - Very sick; hospital admission necessary; active supportive treatment necessary. 10   - Moribund; fatal processes progressing rapidly. 0     - Dead  Karnofsky DA, Abelmann WH, Craver LS and Burchenal JH (1948) The use of the nitrogen mustards in the palliative treatment of carcinoma: with particular reference to bronchogenic carcinoma Cancer 1 634-56  LABORATORY DATA:  Lab Results  Component Value Date   WBC 4.9 04/05/2016   HGB 15.9 04/05/2016   HCT 47.7 04/05/2016   MCV 85.2 04/05/2016   PLT 259 04/05/2016   Lab Results  Component Value Date   NA 143 10/02/2017   K 4.1 10/02/2017   CL 103 10/02/2017   CO2  25 10/02/2017   Lab Results  Component Value Date   ALT 30 09/27/2015   AST 26 09/27/2015   ALKPHOS 110 09/27/2015   BILITOT 1.3 (H) 09/27/2015     RADIOGRAPHY: No results found.    IMPRESSION/PLAN: 1. 67 y.o. gentleman with Stage T1c adenocarcinoma of the prostate with Gleason Score of 3+4, and PSA of 5.43. We discussed the patient's workup and outlined the nature of prostate cancer in this setting. The patient's T stage, Gleason's score, and PSA put him into the favorable intermediate risk group. Accordingly, he is eligible for a variety of potential treatment options including brachytherapy, 5.5 - 8 weeks of external radiation or 5 weeks of external radiation followed by a brachytherapy boost. We discussed the available radiation techniques, and focused on the details and logistics and delivery. We discussed and outlined the risks, benefits, short and long-term effects associated with radiotherapy and compared and contrasted these with prostatectomy. We discussed the role of SpaceOAR in reducing the rectal toxicity associated with radiotherapy.   At the conclusion of our conversation, the patient is interested in moving forward with brachytherapy and use of SpaceOAR to reduce rectal toxicity from radiotherapy.  We will share our discussion with Dr. Herrick and move forward with scheduling his CT SIM planning appointment in the near future.  The patient met briefly with Shirley Halsey in our office who will be working closely with him to coordinate OR scheduling and pre and post procedure appointments.  We will contact the pharmaceutical rep to ensure that SpaceOAR is available at the time of procedure.  He will have a prostate MRI following his post-seed CT SIM to confirm appropriate distribution of the SpaceOAR.    Ashlyn W. Bruning, PA-C     , MD  [facility]  Radiation Oncology Direct Dial: [phone]  Fax: [phone] [email or web address]  Skype  [email or web address]  This  document serves as a record of services personally performed by Ashlyn Bruning, PA-C and Dr. . It was created on their behalf by Katie Daubenspeck, a trained medical scribe. The creation of this record is based on the scribe's personal observations and the provider's statements to   them. This document has been checked and approved by the attending provider.  

## 2018-01-16 NOTE — Progress Notes (Signed)
See progress note under physician encounter. 

## 2018-01-17 ENCOUNTER — Telehealth: Payer: Self-pay | Admitting: Medical Oncology

## 2018-01-17 ENCOUNTER — Encounter: Payer: Self-pay | Admitting: General Practice

## 2018-01-17 NOTE — Progress Notes (Signed)
[place] Psychosocial Distress Screening Clinical Social Work  Clinical Social Work was referred by distress screening protocol.  The patient scored a 6 on the Psychosocial Distress Thermometer which indicates moderate distress. Clinical Social Worker contacted patient by phone to assess for distress and other psychosocial needs. Patient reports no distress or needs at this time, CSW briefly reviewed services available in [place] and encouraged him to contact us as needed.    ONCBCN DISTRESS SCREENING 01/16/2018  Screening Type Initial Screening  Distress experienced in past week (1-10) 6  Practical problem type Work/school  Physical Problem type Pain  Physician notified of physical symptoms Yes  Referral to clinical psychology No  Referral to clinical social work No  Referral to dietition No  Referral to financial advocate No  Referral to support programs No  Referral to palliative care No    Clinical Social Worker follow up needed: No.  If yes, follow up plan:  Beverely Pace, [place], LCSW Clinical Social Worker Phone:  [phone]

## 2018-01-17 NOTE — Telephone Encounter (Signed)
Left message to introduce myself as the prostate nurse navigator and my role. I was unable to meet him 9/25 when he consulted with Dr. [name]. He has chosen brachytherapy and was able to meet Shirely who is coordinating the procedure with Dr. Carlton Adam office. I asked him to call me with questions or concerns.

## 2018-01-24 ENCOUNTER — Other Ambulatory Visit: Payer: Self-pay | Admitting: Urology

## 2018-01-24 DIAGNOSIS — G894 Chronic pain syndrome: Secondary | ICD-10-CM | POA: Diagnosis not present

## 2018-01-24 DIAGNOSIS — M25569 Pain in unspecified knee: Secondary | ICD-10-CM | POA: Diagnosis not present

## 2018-01-24 DIAGNOSIS — Z79891 Long term (current) use of opiate analgesic: Secondary | ICD-10-CM | POA: Diagnosis not present

## 2018-01-24 DIAGNOSIS — Z79899 Other long term (current) drug therapy: Secondary | ICD-10-CM | POA: Diagnosis not present

## 2018-01-24 DIAGNOSIS — M069 Rheumatoid arthritis, unspecified: Secondary | ICD-10-CM | POA: Diagnosis not present

## 2018-01-28 ENCOUNTER — Telehealth: Payer: Self-pay | Admitting: *Deleted

## 2018-01-28 NOTE — Telephone Encounter (Signed)
Called patient to inform of pre-seed planning Ct and his implant, lvm for a return call

## 2018-02-21 DIAGNOSIS — M25569 Pain in unspecified knee: Secondary | ICD-10-CM | POA: Diagnosis not present

## 2018-02-21 DIAGNOSIS — M069 Rheumatoid arthritis, unspecified: Secondary | ICD-10-CM | POA: Diagnosis not present

## 2018-02-21 DIAGNOSIS — G894 Chronic pain syndrome: Secondary | ICD-10-CM | POA: Diagnosis not present

## 2018-03-07 ENCOUNTER — Telehealth: Payer: Self-pay | Admitting: *Deleted

## 2018-03-07 NOTE — Telephone Encounter (Signed)
CALLED PATIENT TO REMIND OF CHEST X-RAY AND EKG AND PRE-SEED APPTS. FOR 03-08-18, LVM FOR A RETURN CALL

## 2018-03-08 ENCOUNTER — Encounter (HOSPITAL_COMMUNITY)
Admission: RE | Admit: 2018-03-08 | Discharge: 2018-03-08 | Disposition: A | Discharge status: Home / Self Care | Payer: Medicare Other | Source: Ambulatory Visit | Attending: Urology | Admitting: Urology

## 2018-03-08 ENCOUNTER — Ambulatory Visit (HOSPITAL_COMMUNITY)
Admission: RE | Admit: 2018-03-08 | Discharge: 2018-03-08 | Disposition: A | Discharge status: Home / Self Care | Payer: Medicare Other | Source: Ambulatory Visit | Attending: Urology | Admitting: Urology

## 2018-03-08 ENCOUNTER — Ambulatory Visit
Admission: RE | Admit: 2018-03-08 | Discharge: 2018-03-08 | Disposition: A | Discharge status: Home / Self Care | Payer: Medicare Other | Source: Ambulatory Visit | Attending: Radiation Oncology | Admitting: Radiation Oncology

## 2018-03-08 ENCOUNTER — Other Ambulatory Visit: Payer: Self-pay | Admitting: Urology

## 2018-03-08 DIAGNOSIS — C61 Malignant neoplasm of prostate: Secondary | ICD-10-CM

## 2018-03-08 DIAGNOSIS — Z4682 Encounter for fitting and adjustment of non-vascular catheter: Secondary | ICD-10-CM | POA: Diagnosis not present

## 2018-03-08 DIAGNOSIS — Z01818 Encounter for other preprocedural examination: Secondary | ICD-10-CM | POA: Diagnosis not present

## 2018-03-08 IMAGING — DX DG CHEST 2V
2 series · 2 of 2 positions shown · non-contrast
Comparison: [DATE]

CLINICAL DATA: Preop prostate radioactive seed placement

EXAM:
CHEST - 2 VIEW

[chest lat]
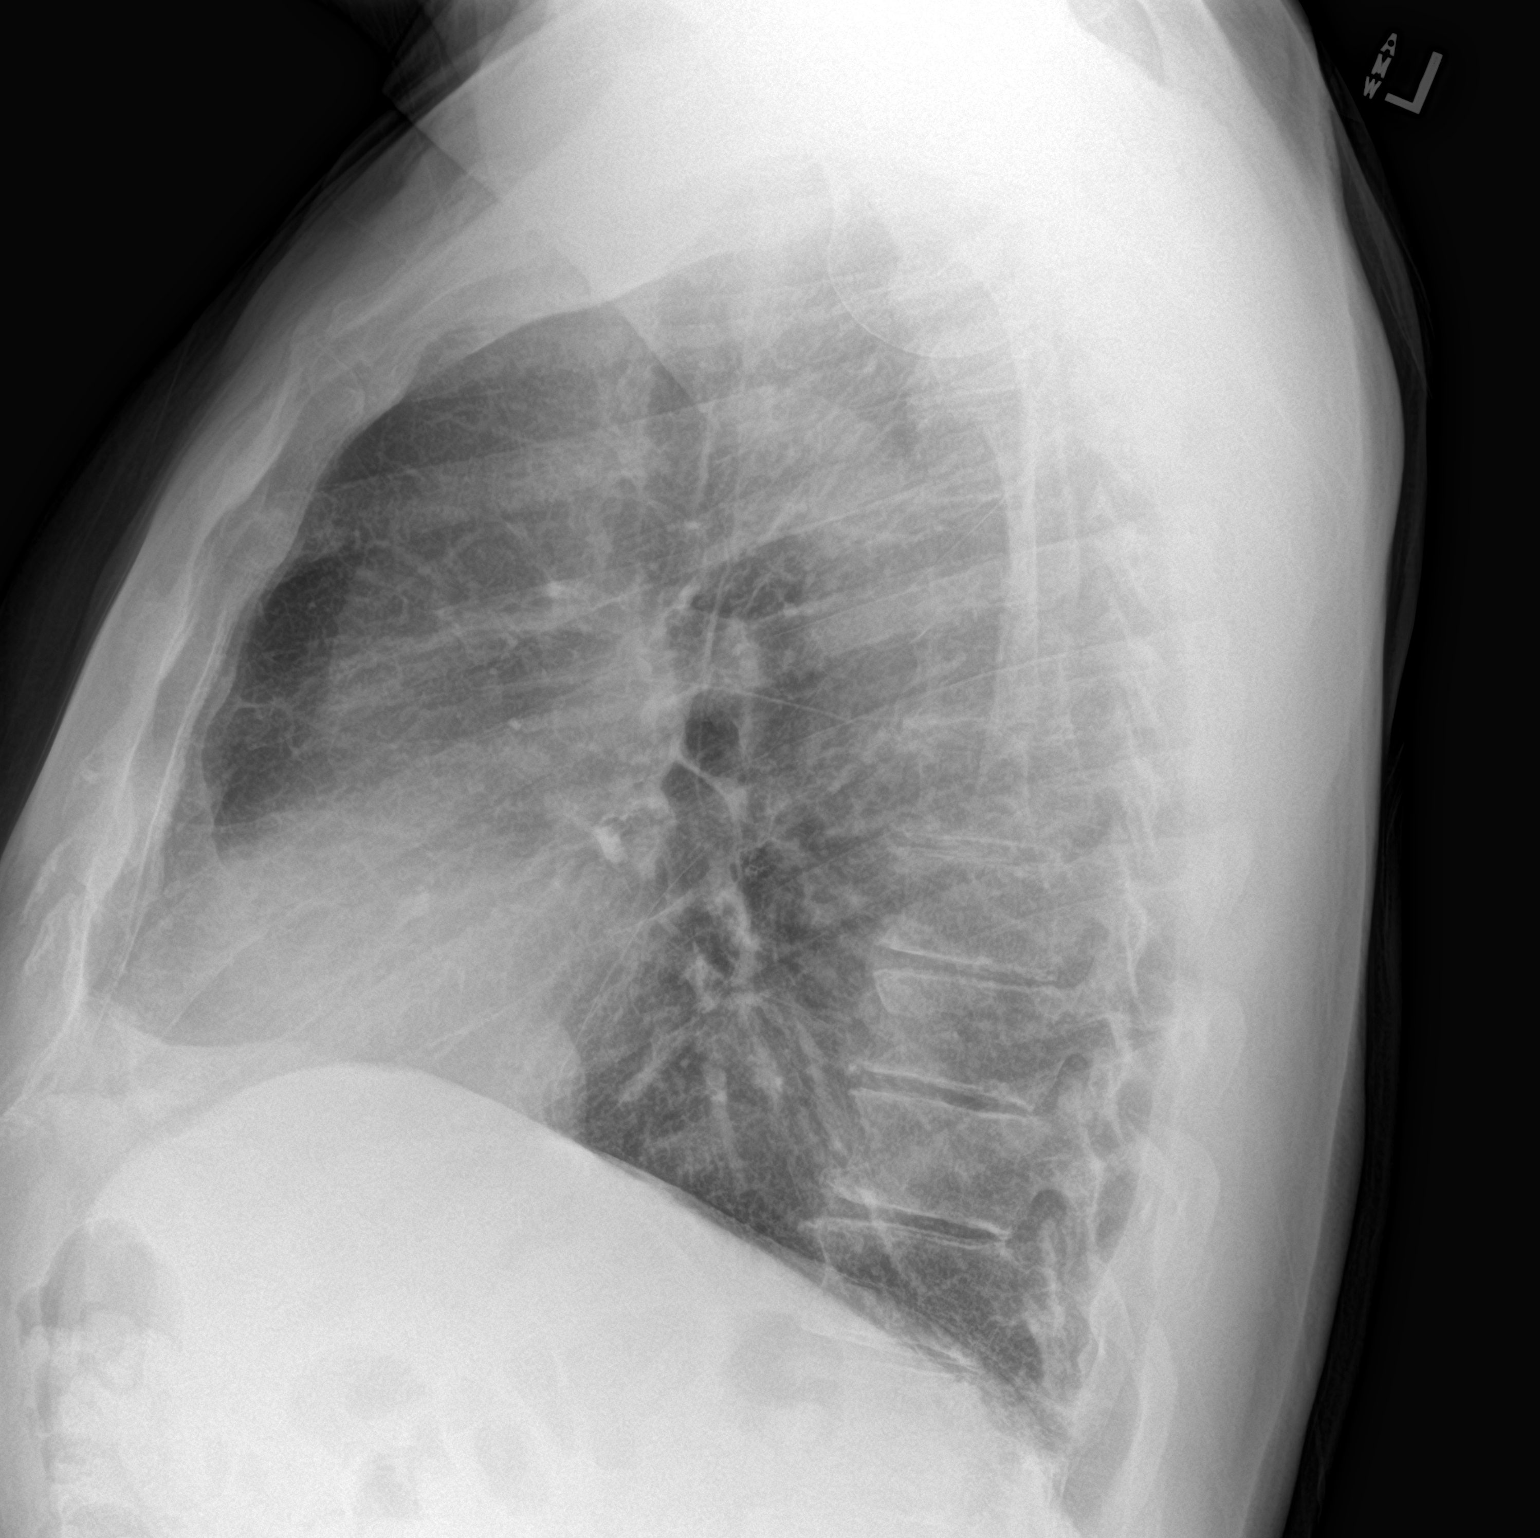

[chest pa]
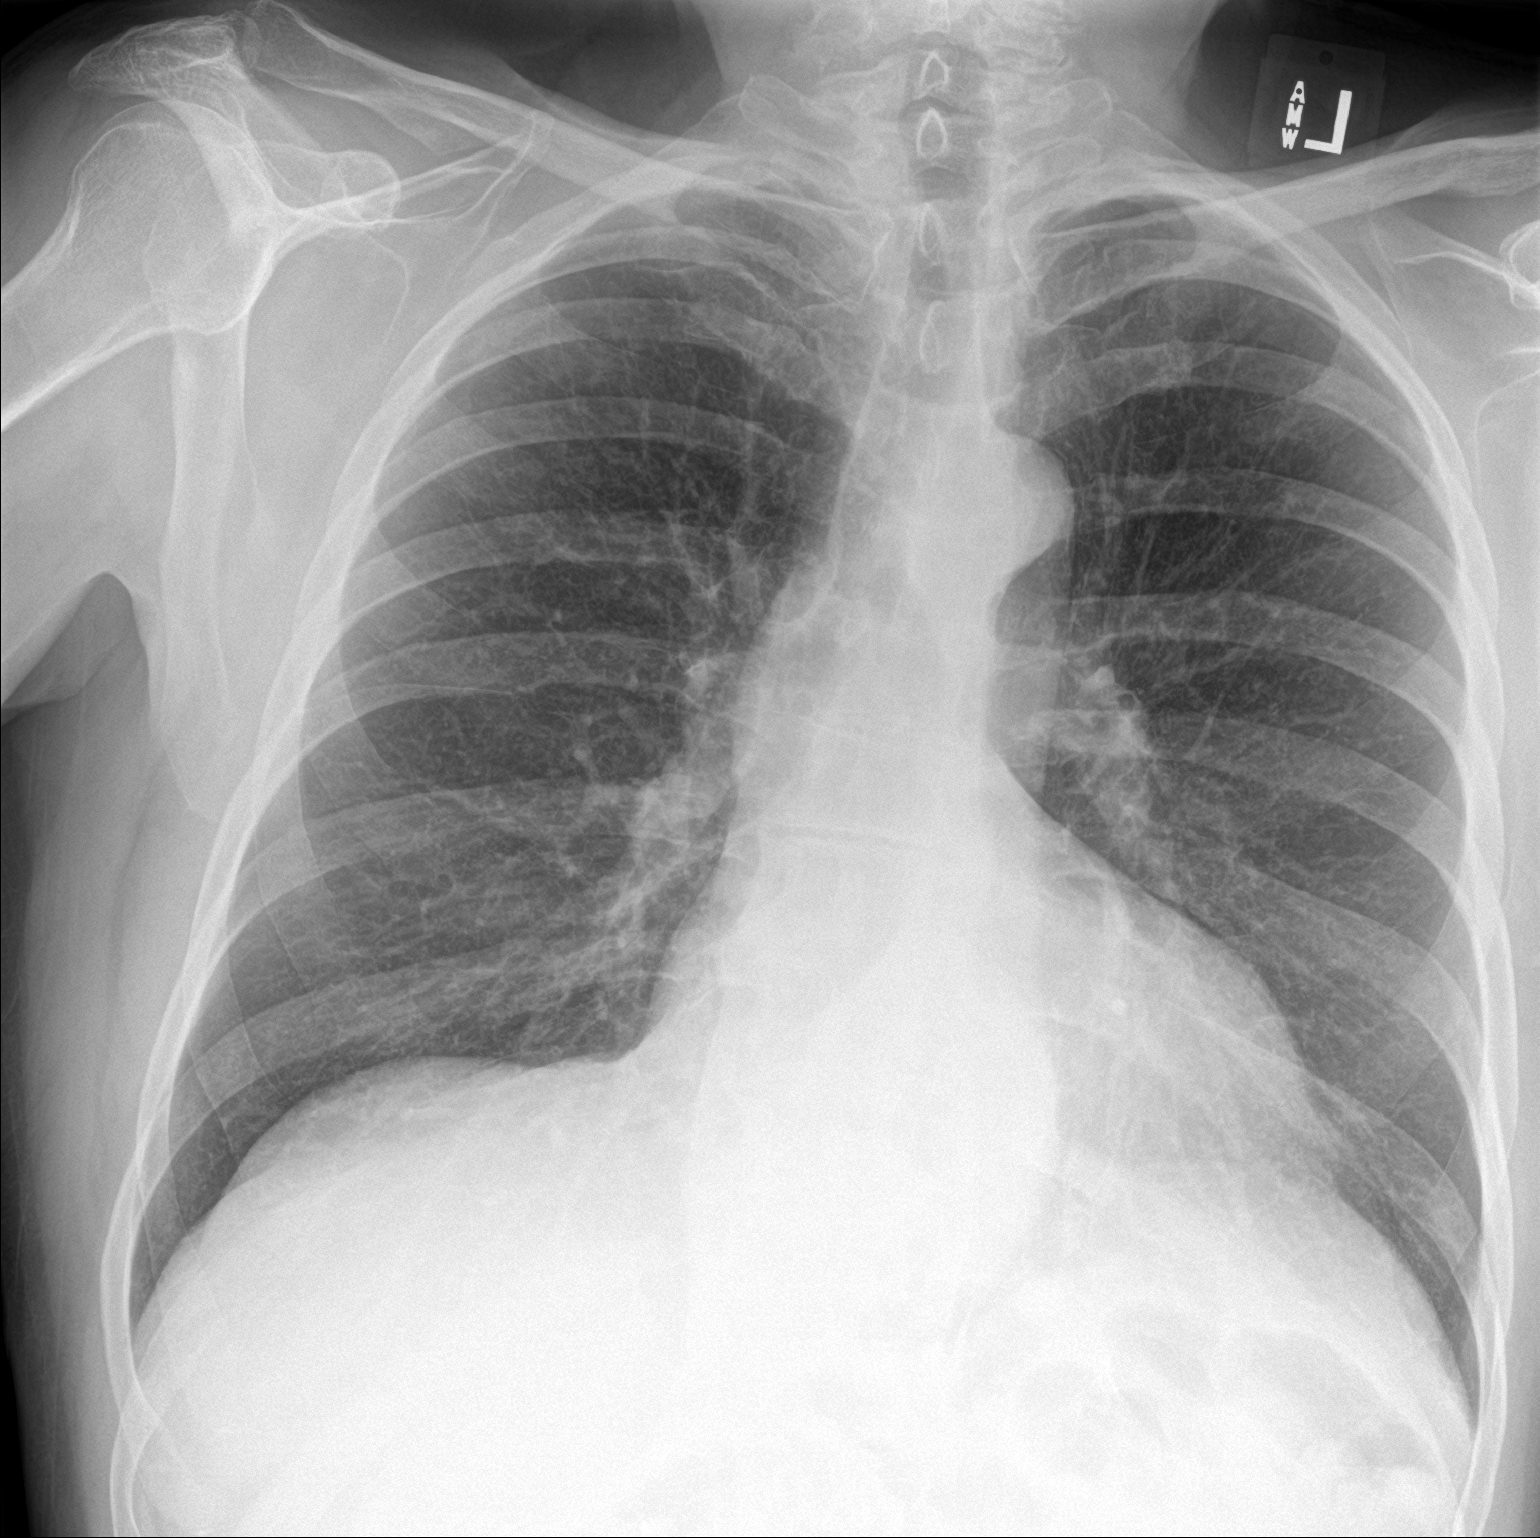

[2 of 2 positions shown; findings below may reference images not displayed]

FINDINGS: Lungs are clear.  No pleural effusion or pneumothorax.

The heart is normal in size.

Visualized osseous structures are within normal limits.
IMPRESSION: Normal chest radiographs.

## 2018-03-08 MED ORDER — DIAZEPAM 5 MG PO TABS: 5.0000 mg | ORAL_TABLET | ORAL | 0 refills | Status: DC | PRN

## 2018-03-08 NOTE — Progress Notes (Signed)
  Radiation Oncology         [phone] ________________________________  Name: Thomas [name] MRN: [id number]  Date: 03/08/2018  DOB: 21-May-1950  SIMULATION AND TREATMENT PLANNING NOTE PUBIC ARCH STUDY  BF:XOVANVBTY, Jeb Levering, MD  [name], MD  DIAGNOSIS: 67 y.o. gentleman with Stage T1c adenocarcinoma of the prostate with Gleason Score of 3+4, and PSA of 5.43.     ICD-10-CM   1. Malignant neoplasm of prostate ([place]) C61     COMPLEX SIMULATION:  The patient presented today for evaluation for possible prostate seed implant. He was brought to the radiation planning suite and placed supine on the CT couch. A 3-dimensional image study set was obtained in upload to the planning computer. There, on each axial slice, I contoured the prostate gland. Then, using three-dimensional radiation planning tools I reconstructed the prostate in view of the structures from the transperineal needle pathway to assess for possible pubic arch interference. In doing so, I did not appreciate any pubic arch interference. Also, the patient's prostate volume was estimated based on the drawn structure. The volume was 40 cc.  Given the pubic arch appearance and prostate volume, patient remains a good candidate to proceed with prostate seed implant. Today, he freely provided informed written consent to proceed.    PLAN: The patient will undergo prostate seed implant.   ________________________________  Sheral Apley. [name], M.D.   This document serves as a record of services personally performed by Tyler Pita, MD. It was created on his behalf by Wilburn Mylar, a trained medical scribe. The creation of this record is based on the scribe's personal observations and the provider's statements to them. This document has been checked and approved by the attending provider.

## 2018-03-11 NOTE — Pre-Procedure Instructions (Signed)
EKG results 03/08/2018 sent to [name] via epic.

## 2018-03-19 DIAGNOSIS — M069 Rheumatoid arthritis, unspecified: Secondary | ICD-10-CM | POA: Diagnosis not present

## 2018-03-19 DIAGNOSIS — M25569 Pain in unspecified knee: Secondary | ICD-10-CM | POA: Diagnosis not present

## 2018-03-19 DIAGNOSIS — G894 Chronic pain syndrome: Secondary | ICD-10-CM | POA: Diagnosis not present

## 2018-03-20 ENCOUNTER — Other Ambulatory Visit: Payer: Self-pay | Admitting: Family Medicine

## 2018-03-26 ENCOUNTER — Ambulatory Visit (HOSPITAL_COMMUNITY): Admission: RE | Admit: 2018-03-26 | Payer: Medicare Other | Source: Ambulatory Visit

## 2018-04-02 ENCOUNTER — Ambulatory Visit (HOSPITAL_COMMUNITY): Admission: RE | Admit: 2018-04-02 | Payer: Medicare Other | Source: Ambulatory Visit

## 2018-04-02 DIAGNOSIS — Z8546 Personal history of malignant neoplasm of prostate: Secondary | ICD-10-CM | POA: Diagnosis not present

## 2018-04-03 ENCOUNTER — Telehealth: Payer: Self-pay | Admitting: *Deleted

## 2018-04-03 NOTE — Telephone Encounter (Signed)
CALLED PATIENT TO REMIND OF LAB WORK FOR 04-04-18 - ARRIVAL TIME- 1:45 PM @ [facility]ITTING, SPOKE WITH PATIENT AND HE IS AWARE OF THIS APPT.

## 2018-04-04 ENCOUNTER — Encounter (HOSPITAL_COMMUNITY)
Admission: RE | Admit: 2018-04-04 | Discharge: 2018-04-04 | Disposition: A | Discharge status: Home / Self Care | Payer: Medicare Other | Source: Ambulatory Visit | Attending: Urology | Admitting: Urology

## 2018-04-04 DIAGNOSIS — Z01812 Encounter for preprocedural laboratory examination: Secondary | ICD-10-CM | POA: Insufficient documentation

## 2018-04-04 LAB — CBC
HCT: 43.7 % (ref 39.0–52.0)
Hemoglobin: 14.2 g/dL (ref 13.0–17.0)
MCH: 29 pg (ref 26.0–34.0)
MCHC: 32.5 g/dL (ref 30.0–36.0)
MCV: 89.2 fL (ref 80.0–100.0)
Platelets: 253 10*3/uL (ref 150–400)
RBC: 4.9 MIL/uL (ref 4.22–5.81)
RDW: 12.6 % (ref 11.5–15.5)
WBC: 10.3 10*3/uL (ref 4.0–10.5)
nRBC: 0 % (ref 0.0–0.2)

## 2018-04-04 LAB — COMPREHENSIVE METABOLIC PANEL
ALT: 20 U/L (ref 0–44)
AST: 34 U/L (ref 15–41)
Albumin: 4.1 g/dL (ref 3.5–5.0)
Alkaline Phosphatase: 78 U/L (ref 38–126)
Anion gap: 10 (ref 5–15)
BUN: 23 mg/dL (ref 8–23)
CO2: 25 mmol/L (ref 22–32)
Calcium: 8.8 mg/dL — ABNORMAL LOW (ref 8.9–10.3)
Chloride: 105 mmol/L (ref 98–111)
Creatinine, Ser: 0.82 mg/dL (ref 0.61–1.24)
GFR calc Af Amer: >60 mL/min (ref >60)
GFR calc non Af Amer: >60 mL/min (ref >60)
Glucose, Bld: 129 mg/dL — ABNORMAL HIGH (ref 70–99)
Potassium: 3.6 mmol/L (ref 3.5–5.1)
Sodium: 140 mmol/L (ref 135–145)
Total Bilirubin: 1 mg/dL (ref 0.3–1.2)
Total Protein: 6.7 g/dL (ref 6.5–8.1)

## 2018-04-04 LAB — PROTIME-INR
INR: 0.94
Prothrombin Time: 12.5 seconds (ref 11.4–15.2)

## 2018-04-04 LAB — APTT: aPTT: 30 seconds (ref 24–36)

## 2018-04-08 ENCOUNTER — Encounter (HOSPITAL_BASED_OUTPATIENT_CLINIC_OR_DEPARTMENT_OTHER): Payer: Self-pay | Admitting: *Deleted

## 2018-04-08 ENCOUNTER — Other Ambulatory Visit: Payer: Self-pay

## 2018-04-08 NOTE — Progress Notes (Signed)
Spoke with Thomas [name] after midnight, arrive 800 am 04-11-18 [facility] Driver son connor Records on chart/epic: ekg 03-08-18, chest xray 03-08-18, labs done 04-04-18: ptt, cbc, cmet, pt Has surgery orders in epic

## 2018-04-10 ENCOUNTER — Telehealth: Payer: Self-pay | Admitting: *Deleted

## 2018-04-10 NOTE — Telephone Encounter (Signed)
CALLED PATIENT TO REMIND OF IMPLANT AND SPACE OAR FOR 04-11-18, SPOKE WITH PATIENT AND HE IS AWARE OF THESE APPTS.

## 2018-04-11 ENCOUNTER — Encounter (HOSPITAL_BASED_OUTPATIENT_CLINIC_OR_DEPARTMENT_OTHER): Payer: Self-pay | Admitting: *Deleted

## 2018-04-11 ENCOUNTER — Ambulatory Visit (HOSPITAL_BASED_OUTPATIENT_CLINIC_OR_DEPARTMENT_OTHER): Payer: Medicare Other | Admitting: Anesthesiology

## 2018-04-11 ENCOUNTER — Ambulatory Visit (HOSPITAL_BASED_OUTPATIENT_CLINIC_OR_DEPARTMENT_OTHER)
Admission: RE | Admit: 2018-04-11 | Discharge: 2018-04-11 | Disposition: A | Discharge status: Home / Self Care | Payer: Medicare Other | Attending: Urology | Admitting: Urology

## 2018-04-11 ENCOUNTER — Ambulatory Visit (HOSPITAL_COMMUNITY): Payer: Medicare Other

## 2018-04-11 ENCOUNTER — Encounter (HOSPITAL_BASED_OUTPATIENT_CLINIC_OR_DEPARTMENT_OTHER)
Admission: RE | Disposition: A | Discharge status: Home / Self Care | Payer: Self-pay | Source: Home / Self Care | Attending: Urology

## 2018-04-11 DIAGNOSIS — Z79899 Other long term (current) drug therapy: Secondary | ICD-10-CM | POA: Insufficient documentation

## 2018-04-11 DIAGNOSIS — Z7952 Long term (current) use of systemic steroids: Secondary | ICD-10-CM | POA: Diagnosis not present

## 2018-04-11 DIAGNOSIS — C61 Malignant neoplasm of prostate: Secondary | ICD-10-CM | POA: Diagnosis not present

## 2018-04-11 DIAGNOSIS — Z791 Long term (current) use of non-steroidal anti-inflammatories (NSAID): Secondary | ICD-10-CM | POA: Insufficient documentation

## 2018-04-11 DIAGNOSIS — Z79891 Long term (current) use of opiate analgesic: Secondary | ICD-10-CM | POA: Insufficient documentation

## 2018-04-11 DIAGNOSIS — I10 Essential (primary) hypertension: Secondary | ICD-10-CM | POA: Diagnosis not present

## 2018-04-11 HISTORY — DX: Hypoglycemia, unspecified: E16.2

## 2018-04-11 HISTORY — PX: CYSTOSCOPY: SHX5120

## 2018-04-11 HISTORY — DX: Claustrophobia: F40.240

## 2018-04-11 HISTORY — PX: SPACE OAR INSTILLATION: SHX6769

## 2018-04-11 HISTORY — PX: RADIOACTIVE SEED IMPLANT: SHX5150

## 2018-04-11 HISTORY — DX: Malignant (primary) neoplasm, unspecified: C80.1

## 2018-04-11 SURGERY — INSERTION, RADIATION SOURCE, PROSTATE
Anesthesia: General | Site: Rectum

## 2018-04-11 MED ORDER — PHENAZOPYRIDINE HCL 200 MG PO TABS: 200.0000 mg | ORAL_TABLET | Freq: Three times a day (TID) | ORAL | 0 refills | Status: DC | PRN

## 2018-04-11 MED ORDER — DEXAMETHASONE SODIUM PHOSPHATE 10 MG/ML IJ SOLN
INTRAMUSCULAR | Status: DC | PRN
  Administered 2018-04-11: 4 mg via INTRAVENOUS

## 2018-04-11 MED ORDER — PHENAZOPYRIDINE HCL 200 MG PO TABS
200.0000 mg | ORAL_TABLET | Freq: Three times a day (TID) | ORAL | Status: DC
  Administered 2018-04-11: 200 mg via ORAL
  Filled 2018-04-11: qty 1

## 2018-04-11 MED ORDER — TAMSULOSIN HCL 0.4 MG PO CAPS
0.4000 mg | ORAL_CAPSULE | Freq: Once | ORAL | Status: AC
  Administered 2018-04-11: 0.4 mg via ORAL
  Filled 2018-04-11: qty 1

## 2018-04-11 MED ORDER — SODIUM CHLORIDE (PF) 0.9 % IJ SOLN
INTRAMUSCULAR | Status: DC | PRN
  Administered 2018-04-11: 10 mL

## 2018-04-11 MED ORDER — CIPROFLOXACIN IN D5W 400 MG/200ML IV SOLN
400.0000 mg | INTRAVENOUS | Status: AC
  Administered 2018-04-11: 400 mg via INTRAVENOUS
  Filled 2018-04-11: qty 200

## 2018-04-11 MED ORDER — LIDOCAINE 2% (20 MG/ML) 5 ML SYRINGE
INTRAMUSCULAR | Status: AC
  Filled 2018-04-11: qty 5

## 2018-04-11 MED ORDER — ONDANSETRON HCL 4 MG/2ML IJ SOLN
INTRAMUSCULAR | Status: DC | PRN
  Administered 2018-04-11: 4 mg via INTRAVENOUS

## 2018-04-11 MED ORDER — MIDAZOLAM HCL 2 MG/2ML IJ SOLN
INTRAMUSCULAR | Status: AC
  Filled 2018-04-11: qty 2

## 2018-04-11 MED ORDER — PROPOFOL 10 MG/ML IV BOLUS
INTRAVENOUS | Status: DC | PRN
  Administered 2018-04-11: 30 mg via INTRAVENOUS
  Administered 2018-04-11: 40 mg via INTRAVENOUS
  Administered 2018-04-11: 275 mg via INTRAVENOUS

## 2018-04-11 MED ORDER — ONDANSETRON HCL 4 MG/2ML IJ SOLN
4.0000 mg | Freq: Once | INTRAMUSCULAR | Status: DC | PRN
  Filled 2018-04-11: qty 2

## 2018-04-11 MED ORDER — OXYCODONE HCL 5 MG/5ML PO SOLN
5.0000 mg | Freq: Once | ORAL | Status: AC | PRN
  Filled 2018-04-11: qty 5

## 2018-04-11 MED ORDER — PHENAZOPYRIDINE HCL 100 MG PO TABS
ORAL_TABLET | ORAL | Status: AC
  Filled 2018-04-11: qty 2

## 2018-04-11 MED ORDER — FLEET ENEMA 7-19 GM/118ML RE ENEM
1.0000 | ENEMA | Freq: Once | RECTAL | Status: DC
  Filled 2018-04-11: qty 1

## 2018-04-11 MED ORDER — HYDROCODONE-ACETAMINOPHEN 10-325 MG PO TABS: 1.0000 | ORAL_TABLET | Freq: Four times a day (QID) | ORAL | 0 refills | Status: DC | PRN

## 2018-04-11 MED ORDER — DEXAMETHASONE SODIUM PHOSPHATE 10 MG/ML IJ SOLN
INTRAMUSCULAR | Status: AC
  Filled 2018-04-11: qty 1

## 2018-04-11 MED ORDER — ONDANSETRON HCL 4 MG/2ML IJ SOLN
INTRAMUSCULAR | Status: AC
  Filled 2018-04-11: qty 2

## 2018-04-11 MED ORDER — FENTANYL CITRATE (PF) 100 MCG/2ML IJ SOLN
25.0000 ug | INTRAMUSCULAR | Status: DC | PRN
  Administered 2018-04-11: 25 ug via INTRAVENOUS
  Filled 2018-04-11: qty 1

## 2018-04-11 MED ORDER — TAMSULOSIN HCL 0.4 MG PO CAPS: 0.4000 mg | ORAL_CAPSULE | Freq: Every day | ORAL | 11 refills | Status: DC

## 2018-04-11 MED ORDER — TAMSULOSIN HCL 0.4 MG PO CAPS
ORAL_CAPSULE | ORAL | Status: AC
  Filled 2018-04-11: qty 1

## 2018-04-11 MED ORDER — FENTANYL CITRATE (PF) 100 MCG/2ML IJ SOLN
INTRAMUSCULAR | Status: AC
  Filled 2018-04-11: qty 2

## 2018-04-11 MED ORDER — FENTANYL CITRATE (PF) 100 MCG/2ML IJ SOLN
INTRAMUSCULAR | Status: DC | PRN
  Administered 2018-04-11 (×2): 50 ug via INTRAVENOUS

## 2018-04-11 MED ORDER — IOHEXOL 300 MG/ML  SOLN
INTRAMUSCULAR | Status: DC | PRN
  Administered 2018-04-11: 7 mL

## 2018-04-11 MED ORDER — PROPOFOL 10 MG/ML IV BOLUS
INTRAVENOUS | Status: AC
  Filled 2018-04-11: qty 40

## 2018-04-11 MED ORDER — LACTATED RINGERS IV SOLN
INTRAVENOUS | Status: DC
  Administered 2018-04-11 (×2): via INTRAVENOUS
  Filled 2018-04-11: qty 1000

## 2018-04-11 MED ORDER — OXYCODONE HCL 5 MG PO TABS
ORAL_TABLET | ORAL | Status: AC
  Filled 2018-04-11: qty 1

## 2018-04-11 MED ORDER — OXYCODONE HCL 5 MG PO TABS
5.0000 mg | ORAL_TABLET | Freq: Once | ORAL | Status: AC | PRN
  Administered 2018-04-11: 5 mg via ORAL
  Filled 2018-04-11: qty 1

## 2018-04-11 MED ORDER — SODIUM CHLORIDE 0.9 % IR SOLN
Status: DC | PRN
  Administered 2018-04-11: 1000 mL

## 2018-04-11 MED ORDER — DIAZEPAM 10 MG PO TABS: 20.0000 mg | ORAL_TABLET | Freq: Once | ORAL | 0 refills | Status: AC

## 2018-04-11 MED ORDER — CIPROFLOXACIN IN D5W 400 MG/200ML IV SOLN
INTRAVENOUS | Status: AC
  Filled 2018-04-11: qty 200

## 2018-04-11 MED ORDER — MIDAZOLAM HCL 5 MG/5ML IJ SOLN
INTRAMUSCULAR | Status: DC | PRN
  Administered 2018-04-11: 2 mg via INTRAVENOUS

## 2018-04-11 MED ORDER — LIDOCAINE 2% (20 MG/ML) 5 ML SYRINGE
INTRAMUSCULAR | Status: DC | PRN
  Administered 2018-04-11: 100 mg via INTRAVENOUS

## 2018-04-11 SURGICAL SUPPLY — 45 items
BAG URINE DRAINAGE (UROLOGICAL SUPPLIES) ×4 IMPLANT
BLADE CLIPPER SURG (BLADE) ×4 IMPLANT
CATH FOLEY 2WAY SLVR  5CC 16FR (CATHETERS) ×1
CATH FOLEY 2WAY SLVR 5CC 16FR (CATHETERS) ×3 IMPLANT
CATH ROBINSON RED A/P 16FR (CATHETERS) IMPLANT
CATH ROBINSON RED A/P 20FR (CATHETERS) ×4 IMPLANT
CLOTH BEACON ORANGE TIMEOUT ST (SAFETY) ×4 IMPLANT
CONT SPECI 4OZ STER CLIK (MISCELLANEOUS) ×8 IMPLANT
COVER BACK TABLE 60X90IN (DRAPES) ×4 IMPLANT
COVER MAYO STAND STRL (DRAPES) ×4 IMPLANT
COVER WAND RF STERILE (DRAPES) ×4 IMPLANT
DRSG TEGADERM 4X4.75 (GAUZE/BANDAGES/DRESSINGS) ×4 IMPLANT
DRSG TEGADERM 8X12 (GAUZE/BANDAGES/DRESSINGS) ×4 IMPLANT
GAUZE SPONGE 4X4 12PLY STRL (GAUZE/BANDAGES/DRESSINGS) ×4 IMPLANT
GLOVE BIO SURGEON STRL SZ 6 (GLOVE) IMPLANT
GLOVE BIO SURGEON STRL SZ 6.5 (GLOVE) ×4 IMPLANT
GLOVE BIO SURGEON STRL SZ7 (GLOVE) IMPLANT
GLOVE BIO SURGEON STRL SZ7.5 (GLOVE) ×8 IMPLANT
GLOVE BIO SURGEON STRL SZ8 (GLOVE) IMPLANT
GLOVE BIOGEL PI IND STRL 6 (GLOVE) IMPLANT
GLOVE BIOGEL PI IND STRL 6.5 (GLOVE) IMPLANT
GLOVE BIOGEL PI IND STRL 7.0 (GLOVE) ×3 IMPLANT
GLOVE BIOGEL PI IND STRL 7.5 (GLOVE) ×3 IMPLANT
GLOVE BIOGEL PI IND STRL 8 (GLOVE) IMPLANT
GLOVE BIOGEL PI INDICATOR 6 (GLOVE)
GLOVE BIOGEL PI INDICATOR 6.5 (GLOVE)
GLOVE BIOGEL PI INDICATOR 7.0 (GLOVE) ×1
GLOVE BIOGEL PI INDICATOR 7.5 (GLOVE) ×1
GLOVE BIOGEL PI INDICATOR 8 (GLOVE)
GLOVE ECLIPSE 8.0 STRL XLNG CF (GLOVE) IMPLANT
GOWN STRL REUS W/TWL XL LVL3 (GOWN DISPOSABLE) ×4 IMPLANT
HOLDER FOLEY CATH W/STRAP (MISCELLANEOUS) ×4 IMPLANT
I-SEED AGX100 ×4 IMPLANT
IMPL SPACEOAR SYSTEM 10ML (Spacer) ×3 IMPLANT
IMPLANT SPACEOAR SYSTEM 10ML (Spacer) ×4 IMPLANT
IV NS 1000ML (IV SOLUTION) ×2
IV NS 1000ML BAXH (IV SOLUTION) ×6 IMPLANT
KIT TURNOVER CYSTO (KITS) ×4 IMPLANT
MARKER SKIN DUAL TIP RULER LAB (MISCELLANEOUS) ×4 IMPLANT
PACK CYSTO (CUSTOM PROCEDURE TRAY) ×4 IMPLANT
SUT BONE WAX W31G (SUTURE) IMPLANT
SYR 10ML LL (SYRINGE) ×4 IMPLANT
UNDERPAD 30X30 (UNDERPADS AND DIAPERS) ×8 IMPLANT
WATER STERILE IRR 3000ML UROMA (IV SOLUTION) IMPLANT
WATER STERILE IRR 500ML POUR (IV SOLUTION) ×4 IMPLANT

## 2018-04-11 NOTE — H&P (Signed)
Pt presents today for pre-operative history and physical exam in anticipation of brachytherapy seed implant by [name] on 04/11/18. He is scheduled for an MRI this afternoon and is very nervous about it as he has claustrophobia. He was provided diazepam and hopes this will help. Otherwise he is doing well and without complaint.    Pt denies F/C, HA, CP, SOB, N/V, diarrhea/constipation, flank pain, hematuria, and dysuria.   HX:     CC: f/u to monitor Prostate Cancer  HPI: Thomas [name] is a 67 year-old male established patient who is here for interval evaluation of his prostate cancer.  5/12 cores positive. One core (80%) of 3+4 = 7 with only 5% of the core being Gleason 4. The remaining cores were all Gleason 6.   The patient's international prostate symptom score 7.   I saw the patient initially several years ago for testosterone replacement therapy. As his PSA rose we stopped his testosterone and ultimately performed a prostate biopsy. He has now been off of testosterone replacement therapy for about 1 year.   Interval: The patient presents today for follow-up of his new diagnosis of prostate cancer. He tolerated the biopsy okay. He denies any ongoing hematuria or dysuria. He has not had any fevers or chills.   The patient is otherwise quite healthy, and is active as a stonemason. He is a nonsmoker. He has an extensive family history of prostate cancer.   The patient has a rather dark [place] life, does not think he'll survive much past 10 years. This is despite my telling him that he is actually quite healthy. He is very concerned about his quality of life as he ages. He refuses to suffer from poor life quality.   He was diagnosed with prostate cancer in approximately 10/01/2017. The patient's gleason score was: 3+4 = 7. Pretreatment PSA: 5.43.   The patient's most recent PSA was 2.40. This was drawn on approximately 09/24/2017.   Pre-treatment SHIM: 22. He does have problems with  erections. Currently he is using Sildenafil for his ED. His erections are satisfactory for intercouse.   The patient denies having urinary incontinence. He has had new back pain. The patient denies any progressive voiding symptoms.     ALLERGIES: No Allergies    MEDICATIONS: Ibuprofen 600 MG Oral Tablet PRN  Lisinopril 20 MG Oral Tablet Oral  Meloxicam  Oxycodone-Acetaminophen  Prednisone 10 mg tablet     GU PSH: Prostate Needle Biopsy - 09/21/2017      PSH Notes: Inguinal Hernia Repair   NON-GU PSH: Surgical Pathology, Gross And Microscopic Examination For Prostate Needle - 09/21/2017    GU PMH: History of prostate cancer - 10/15/2017 Elevated PSA - 04/09/2017, - 03/30/2017 (Stable), - 11/27/2016, Elevated prostate specific antigen (PSA), - 2016 ED due to arterial insufficiency - 11/27/2016 Primary hypogonadism - 11/27/2016, - 2018, - 2017, - 2017, Hypogonadism, testicular, - 2017 BPH w/LUTS, Benign localized prostatic hyperplasia with lower urinary tract symptoms (LUTS) - 2016 Nocturia, Nocturia - 2016      PMH Notes: Rheumatoid arthritis    NON-GU PMH: Encounter for general adult medical examination without abnormal findings, Encounter for preventive health examination - 2016 Personal history of other diseases of the circulatory system, History of hypertension - 2014    FAMILY HISTORY: Diabetes - Father Family Health Status Number - Runs In Family Father Deceased At Age38 ___ - Runs In Family Mother Deceased At Age 51 from diabetic complicati - Runs In Family Prostate Cancer - Runs  In Family   SOCIAL HISTORY: Marital Status: Married Preferred Language: English; Ethnicity: Not Hispanic Or Latino; Race: White Current Smoking Status: Patient has never smoked.   Tobacco Use Assessment Completed: Used Tobacco in last 30 days? Does not use smokeless tobacco. Has never drank.  Does not use drugs. Drinks 2 caffeinated drinks per day. Has not had a blood transfusion.     Notes:  Alcohol Use, Never A Smoker, Marital History - Currently Married, Caffeine Use   REVIEW OF SYSTEMS:    GU Review Male:   Patient denies frequent urination, hard to postpone urination, burning/ pain with urination, get up at night to urinate, leakage of urine, stream starts and stops, trouble starting your stream, have to strain to urinate , erection problems, and penile pain.  Gastrointestinal (Upper):   Patient denies nausea, vomiting, and indigestion/ heartburn.  Gastrointestinal (Lower):   Patient denies diarrhea and constipation.  Constitutional:   Patient denies fever, night sweats, weight loss, and fatigue.  Skin:   Patient denies skin rash/ lesion and itching.  Eyes:   Patient denies blurred vision and double vision.  Ears/ Nose/ Throat:   Patient denies sinus problems and sore throat.  Hematologic/Lymphatic:   Patient denies swollen glands and easy bruising.  Cardiovascular:   Patient denies leg swelling and chest pains.  Respiratory:   Patient denies cough and shortness of breath.  Endocrine:   Patient denies excessive thirst.  Musculoskeletal:   Patient reports back pain and joint pain.   Neurological:   Patient denies headaches and dizziness.  Psychologic:   Patient denies depression and anxiety.   VITAL SIGNS:      04/02/2018 12:51 PM  Weight 242 lb / 109.77 kg  Height 79 in / 200.66 cm  BP 145/82 mmHg  Pulse 70 /min  Temperature 98.2 F / 36.7 C  BMI 27.3 kg/m   MULTI-SYSTEM PHYSICAL EXAMINATION:    Constitutional: Well-nourished. No physical deformities. Normally developed. Good grooming.  Neck: Neck symmetrical, not swollen. Normal tracheal position.  Respiratory: Normal breath sounds. No labored breathing, no use of accessory muscles.   Cardiovascular: Regular rate and rhythm. No murmur, no gallop.  Lymphatic: No enlargement of neck, axillae, groin.  Skin: No paleness, no jaundice, no cyanosis. No lesion, no ulcer, no rash.  Neurologic / Psychiatric: Oriented to  time, oriented to place, oriented to person. No depression, no anxiety, no agitation.  Gastrointestinal: No mass, no tenderness, no rigidity, non obese abdomen.  Eyes: Normal conjunctivae. Normal eyelids.  Ears, Nose, Mouth, and Throat: Left ear no scars, no lesions, no masses. Right ear no scars, no lesions, no masses. Nose no scars, no lesions, no masses. Normal hearing. Normal lips.  Musculoskeletal: Normal gait and station of head and neck.     PAST DATA REVIEWED:  Source Of History:  Patient  Records Review:   Previous Patient Records  Urine Test Review:   Urinalysis   04/02/18  Urinalysis  Urine Appearance Clear   Urine Color Yellow   Urine Glucose Neg mg/dL  Urine Bilirubin Neg mg/dL  Urine Ketones Neg mg/dL  Urine Specific Gravity >1.030   Urine Blood Neg ery/uL  Urine pH 5.5   Urine Protein Trace mg/dL  Urine Urobilinogen 1.0 mg/dL  Urine Nitrites Neg   Urine Leukocyte Esterase Neg leu/uL   PROCEDURES:          Urinalysis Dipstick Dipstick Cont'd  Color: Yellow Bilirubin: Neg mg/dL  Appearance: Clear Ketones: Neg mg/dL  Specific Gravity: >1.030 Blood:  Neg ery/uL  pH: 5.5 Protein: Trace mg/dL  Glucose: Neg mg/dL Urobilinogen: 1.0 mg/dL    Nitrites: Neg    Leukocyte Esterase: Neg leu/uL    ASSESSMENT:      ICD-10 Details  1 GU:   History of prostate cancer - Z85.46    PLAN:            Medications Stop Meds: Levofloxacin 750 mg tablet Take one tablet po morning of biopsy  Start: 06/28/2017  Discontinue: 04/02/2018  - Reason: The medication cycle was completed.  Testosterone Enanthate 200 mg/ml vial 0 Intramuscular  Start: 03/26/2013  Discontinue: 04/02/2018  - Reason: The medication cycle was completed.            Schedule Return Visit/Planned Activity: Keep Scheduled Appointment - Schedule Surgery          Document Letter(s):  Created for Patient: Clinical Summary         Notes:   There are no changes in the patients history or physical exam since  last evaluation by [name]. Pt is scheduled to undergo brachytherapy 04/11/18.

## 2018-04-11 NOTE — Op Note (Signed)
Preoperative diagnosis: Clinical stage TI C adenocarcinoma the prostate  Postoperative diagnosis: Same  Procedure:  #1 I-125 prostate seed implantation  #2 cystourethroscopy #3 instillation of SpaceOAR biogel  Surgeon: Louis Meckel, M.D. Radiation Oncologist: Tyler Pita, M.D.  Anesthesia: Gen.   Indications: Patient  was diagnosed with clinical stage TI C prostate cancer. We had extensive discussion with him about treatment options versus. He elected to proceed with seed implantation. He underwent consultation my office as well as with Dr. Tyler Pita. He appeared to understand the advantages disadvantages potential risks of this treatment option. Full informed consent has been obtained. The patient is had preoperative ciprofloxacin. PAS compression boots were placed.   Technique and findings: Patient was brought the operating room where he had  successful induction of general anesthesia. He was placed in lithotomy position and prepped and draped in usual manner. Appropriate surgical timeout was performed. Radiation oncology department placed a transrectal ultrasound probe anchoring stand. Foley catheter with contrast in the balloon was inserted without difficulty. Anchoring needles were placed within the prostate. Real-time contouring of the urethra prostate and rectum were performed and the dosing parameters were established. Targeted dose was 145 gray. We then came to the operating suite suite for placement of the needles. A second timeout was performed. All needle passage was done with real-time transrectal ultrasound guidance with the sagittal plane. A total of 26 needles were placed. See implantation itself was done with the robotic implanter. 69 active seeds were implanted. The brachytherapy template was then removed.  A site in the midline was selected on the perineum for placement of an 18 g needle with saline.  The needle was advanced above the rectum and below Denonvillier's  fascia to the mid gland and confirmed to be in the midline on transverse imaging.  One cc of saline was injected confirming appropriate expansion of this space.  A total of 5 cc of saline was then injected to open the space further bilaterally.  The saline syringe was then removed and the SpaceOAR hydrogel was injected with good distribution bilaterally.A Foley catheter was removed and flexible cystoscopy failed to show any seeds outside the prostate. The patient was brought to recovery room in stable condition.

## 2018-04-11 NOTE — Discharge Instructions (Signed)

## 2018-04-11 NOTE — Anesthesia Preprocedure Evaluation (Addendum)
Anesthesia Evaluation  Patient identified by MRN, date of birth, ID band Patient awake    Reviewed: Allergy & Precautions, NPO status , Patient's Chart, lab work & pertinent test results  Airway Mallampati: II  TM Distance: >3 FB Neck ROM: Full    Dental  (+) Teeth Intact, Dental Advisory Given   Pulmonary    breath sounds clear to auscultation       Cardiovascular hypertension,  Rhythm:Regular Rate:Normal     Neuro/Psych    GI/Hepatic   Endo/Other    Renal/GU      Musculoskeletal   Abdominal   Peds  Hematology   Anesthesia Other Findings   Reproductive/Obstetrics                             Anesthesia Physical Anesthesia Plan  ASA: III  Anesthesia Plan: General   Post-op Pain Management:    Induction: Intravenous  PONV Risk Score and Plan: Ondansetron and Dexamethasone  Airway Management Planned: LMA  Additional Equipment:   Intra-op Plan:   Post-operative Plan:   Informed Consent: I have reviewed the patients History and Physical, chart, labs and discussed the procedure including the risks, benefits and alternatives for the proposed anesthesia with the patient or authorized representative who has indicated his/her understanding and acceptance.     Dental advisory given  Plan Discussed with: Anesthesiologist and CRNA  Anesthesia Plan Comments:         Anesthesia Quick Evaluation  

## 2018-04-11 NOTE — Transfer of Care (Addendum)
Immediate Anesthesia Transfer of Care Note  Patient: Thomas [name]  Procedure(s) Performed: RADIOACTIVE SEED IMPLANT/BRACHYTHERAPY IMPLANT (N/A Prostate) CYSTOSCOPY FLEXIBLE (Bladder) SPACE OAR INSTILLATION (N/A Rectum)  Patient Location: PACU  Anesthesia Type:General  Level of Consciousness: awake and oriented  Airway & Oxygen Therapy: Patient Spontanous Breathing and Patient connected to nasal cannula oxygen  Post-op Assessment: Report given to RN  Post vital signs: Reviewed and stable  Last Vitals: 157/92 Vitals Value Taken Time  BP 165/101 04/11/2018 11:37 AM  Temp    Pulse 83 04/11/2018 11:39 AM  Resp 10 04/11/2018 11:39 AM  SpO2 100 % 04/11/2018 11:39 AM  Vitals shown include unvalidated device data.  Last Pain:  Vitals:   04/11/18 0827  TempSrc:   PainSc: 0-No pain      Patients Stated Pain Goal: 3 (16/10/96 0454)  Complications: No apparent anesthesia complications

## 2018-04-11 NOTE — Anesthesia Procedure Notes (Signed)
Procedure Name: LMA Insertion Date/Time: 04/11/2018 10:18 AM Performed by: Bonney Aid, CRNA Pre-anesthesia Checklist: Patient identified, Emergency Drugs available, Suction available and Patient being monitored Patient Re-evaluated:Patient Re-evaluated prior to induction Oxygen Delivery Method: Circle system utilized Preoxygenation: Pre-oxygenation with 100% oxygen Induction Type: IV induction Ventilation: Mask ventilation without difficulty LMA: LMA inserted LMA Size: 5.0 Number of attempts: 1 Airway Equipment and Method: Bite block Placement Confirmation: positive ETCO2 Tube secured with: Tape Dental Injury: Teeth and Oropharynx as per pre-operative assessment

## 2018-04-12 ENCOUNTER — Encounter (HOSPITAL_BASED_OUTPATIENT_CLINIC_OR_DEPARTMENT_OTHER): Payer: Self-pay | Admitting: Urology

## 2018-04-12 NOTE — Anesthesia Postprocedure Evaluation (Signed)
Anesthesia Post Note  Patient: Thomas [name]  Procedure(s) Performed: RADIOACTIVE SEED IMPLANT/BRACHYTHERAPY IMPLANT (N/A Prostate) CYSTOSCOPY FLEXIBLE (Bladder) SPACE OAR INSTILLATION (N/A Rectum)     Patient location during evaluation: PACU Anesthesia Type: General Level of consciousness: awake and alert Pain management: pain level controlled Vital Signs Assessment: post-procedure vital signs reviewed and stable Respiratory status: spontaneous breathing, nonlabored ventilation and respiratory function stable Cardiovascular status: blood pressure returned to baseline and stable Postop Assessment: no apparent nausea or vomiting Anesthetic complications: no    Last Vitals:  Vitals:   04/11/18 1230 04/11/18 1311  BP: (!) 165/91 (!) 149/96  Pulse: 73 71  Resp: 12 16  Temp:  37.2 C  SpO2: 99% 98%    Last Pain:  Vitals:   04/11/18 1230  TempSrc:   PainSc: [place]

## 2018-04-18 DIAGNOSIS — R3915 Urgency of urination: Secondary | ICD-10-CM | POA: Diagnosis not present

## 2018-04-18 DIAGNOSIS — R300 Dysuria: Secondary | ICD-10-CM | POA: Diagnosis not present

## 2018-04-18 DIAGNOSIS — Z8546 Personal history of malignant neoplasm of prostate: Secondary | ICD-10-CM | POA: Diagnosis not present

## 2018-04-19 DIAGNOSIS — Z79899 Other long term (current) drug therapy: Secondary | ICD-10-CM | POA: Diagnosis not present

## 2018-04-19 DIAGNOSIS — M069 Rheumatoid arthritis, unspecified: Secondary | ICD-10-CM | POA: Diagnosis not present

## 2018-04-19 DIAGNOSIS — Z79891 Long term (current) use of opiate analgesic: Secondary | ICD-10-CM | POA: Diagnosis not present

## 2018-04-19 DIAGNOSIS — G894 Chronic pain syndrome: Secondary | ICD-10-CM | POA: Diagnosis not present

## 2018-04-19 NOTE — Progress Notes (Signed)
  Radiation Oncology         [phone] ________________________________  Name: LIONARDO HAZE MRN: [id number]  Date: 04/19/2018  DOB: 1950-10-02       Prostate Seed Implant  FO:YDXAJOINO, Jeb Levering, MD  No ref. provider found  DIAGNOSIS: 67 y.o. gentleman with Stage T1c adenocarcinoma of the prostate with Gleason Score of 3+4, and PSA of 5.43.  PROCEDURE: Insertion of radioactive I-125 seeds into the prostate gland.  RADIATION DOSE: 145 Gy, definitive therapy.  TECHNIQUE: SHUNSUKE GRANZOW was brought to the operating room with the urologist. He was placed in the dorsolithotomy position. He was catheterized and a rectal tube was inserted. The perineum was shaved, prepped and draped. The ultrasound probe was then introduced into the rectum to see the prostate gland.  TREATMENT DEVICE: A needle grid was attached to the ultrasound probe stand and anchor needles were placed.  3D PLANNING: The prostate was imaged in 3D using a sagittal sweep of the prostate probe. These images were transferred to the planning computer. There, the prostate, urethra and rectum were defined on each axial reconstructed image. Then, the software created an optimized 3D plan and a few seed positions were adjusted. The quality of the plan was reviewed using [facility] information for the target and the following two organs at risk:  Urethra and Rectum.  Then the accepted plan was printed and handed off to the radiation therapist.  Under my supervision, the custom loading of the seeds and spacers was carried out and loaded into sealed vicryl sleeves.  These pre-loaded needles were then placed into the needle holder.Marland Kitchen  PROSTATE VOLUME STUDY:  Using transrectal ultrasound the volume of the prostate was verified to be 43.4 cc.  SPECIAL TREATMENT PROCEDURE/SUPERVISION AND HANDLING: The pre-loaded needles were then delivered under sagittal guidance. A total of 26 needles were used to deposit 69 seeds in the prostate gland. The  individual seed activity was 0.490 mCi.  SpaceOAR:  Yes  COMPLEX SIMULATION: At the end of the procedure, an anterior radiograph of the pelvis was obtained to document seed positioning and count. Cystoscopy was performed to check the urethra and bladder.  MICRODOSIMETRY: At the end of the procedure, the patient was emitting 0.055 mR/hr at 1 meter. Accordingly, he was considered safe for hospital discharge.  PLAN: The patient will return to the radiation oncology clinic for post implant CT dosimetry in three weeks.   ________________________________  Sheral Apley [name], M.D.

## 2018-05-01 ENCOUNTER — Other Ambulatory Visit: Payer: Self-pay | Admitting: Urology

## 2018-05-01 ENCOUNTER — Telehealth: Payer: Self-pay | Admitting: *Deleted

## 2018-05-01 DIAGNOSIS — R3915 Urgency of urination: Secondary | ICD-10-CM | POA: Diagnosis not present

## 2018-05-01 MED ORDER — LORAZEPAM 1 MG PO TABS: 1.0000 mg | ORAL_TABLET | ORAL | 0 refills | Status: DC | PRN

## 2018-05-01 NOTE — Telephone Encounter (Signed)
Called patient to remind of post seed appts. and MRI for 05-02-18, lvm for a return call

## 2018-05-02 ENCOUNTER — Ambulatory Visit: Payer: Self-pay | Admitting: Urology

## 2018-05-02 ENCOUNTER — Ambulatory Visit (HOSPITAL_COMMUNITY): Admission: RE | Admit: 2018-05-02 | Payer: Medicare Other | Source: Ambulatory Visit

## 2018-05-02 ENCOUNTER — Encounter: Payer: Self-pay | Admitting: Medical Oncology

## 2018-05-02 ENCOUNTER — Ambulatory Visit
Admission: RE | Admit: 2018-05-02 | Discharge: 2018-05-02 | Disposition: A | Discharge status: Home / Self Care | Payer: Medicare Other | Source: Ambulatory Visit | Attending: Radiation Oncology | Admitting: Radiation Oncology

## 2018-05-02 ENCOUNTER — Encounter: Payer: Self-pay | Admitting: Urology

## 2018-05-02 ENCOUNTER — Other Ambulatory Visit: Payer: Self-pay

## 2018-05-02 ENCOUNTER — Ambulatory Visit
Admission: RE | Admit: 2018-05-02 | Discharge: 2018-05-02 | Disposition: A | Discharge status: Home / Self Care | Payer: Medicare Other | Source: Ambulatory Visit | Attending: Urology | Admitting: Urology

## 2018-05-02 VITALS — BP 146/94 | HR 74 | Temp 97.9°F | Resp 20 | Ht 79.0 in | Wt 238.6 lb

## 2018-05-02 DIAGNOSIS — R350 Frequency of micturition: Secondary | ICD-10-CM | POA: Insufficient documentation

## 2018-05-02 DIAGNOSIS — Z79899 Other long term (current) drug therapy: Secondary | ICD-10-CM | POA: Diagnosis not present

## 2018-05-02 DIAGNOSIS — F40240 Claustrophobia: Secondary | ICD-10-CM | POA: Diagnosis not present

## 2018-05-02 DIAGNOSIS — M069 Rheumatoid arthritis, unspecified: Secondary | ICD-10-CM | POA: Insufficient documentation

## 2018-05-02 DIAGNOSIS — R3911 Hesitancy of micturition: Secondary | ICD-10-CM | POA: Insufficient documentation

## 2018-05-02 DIAGNOSIS — F419 Anxiety disorder, unspecified: Secondary | ICD-10-CM | POA: Diagnosis not present

## 2018-05-02 DIAGNOSIS — Z923 Personal history of irradiation: Secondary | ICD-10-CM | POA: Diagnosis not present

## 2018-05-02 DIAGNOSIS — C61 Malignant neoplasm of prostate: Secondary | ICD-10-CM | POA: Insufficient documentation

## 2018-05-02 HISTORY — DX: Malignant neoplasm of prostate: C61

## 2018-05-02 NOTE — Progress Notes (Signed)
Weight and vitals stable. Denies pain. Pre seed IPSS 13. Post seed IPSS 25. Reports dysuria. Reports taking AZO thus unsure if he is having hematuria. Denies urinary leakage or incontinence. Reports urinary urgency. Reports he was evaluated by urology yesterday and told he isn't in retention. Scheduled to follow up with urology again in three months. Scheduled for MRI on Monday at 5 pm to confirm SpaceOar placement. Patient anxious about MRI due to claustrophobia.

## 2018-05-02 NOTE — Progress Notes (Signed)
Radiation Oncology         [phone] ________________________________  Name: Thomas [name] MRN: [id number]  Date: 05/02/2018  DOB: August 21, 1950  Post-Seed Follow-Up Visit Note  CC: [name], MD  [name], MD  Diagnosis:   68 y.o. gentleman with Stage T1c adenocarcinoma of the prostate with Gleason Score of 3+4, and PSA of 5.43.    ICD-10-CM   1. Malignant neoplasm of prostate ([place]) C61     Interval Since Last Radiation:  3 weeks 04/11/18: Insertion of radioactive I-125 seeds into the prostate gland; 145 Gy, definitive therapy with SpaceOAR gel placement.  Narrative:  The patient returns today for routine follow-up.  He is complaining of increased urinary frequency and urinary hesitation symptoms. He filled out a questionnaire regarding urinary function today providing and overall IPSS score of 25 characterizing his symptoms as severe with increased frequency, urgency, dysuria, intermittency, weak stream and feelings of incomplete emptying.  He specifically denies gross hematuria or incontinence.  His pre-implant score was 13.  He has increased his Flomax to twice daily with mild improvement.  He is also using AZO as needed for dysuria and this has helped tremendously.  He denies any bowel symptoms.  He reports that he was seen over at [facility] urology on 05/01/2018 and advised that he was not in urinary retention and no evidence of urinary tract infection.  He has a scheduled follow-up with [name] in April 2020.  He is currently scheduled for prostate MRI on Monday, May 06, 2018 at 5 PM and is extremely anxious.  He reports that he attempted to have an MRI a few years ago with an antianxiety medication on board and was still unable to tolerate the procedure.  He is not confident that he will be able to tolerate the upcoming procedure despite premedication.  ALLERGIES:  is allergic to chlorthalidone.  Meds: Current Outpatient Medications  Medication Sig  Dispense Refill  . HYDROcodone-acetaminophen (NORCO) 10-325 MG tablet Take 1 tablet by mouth every 6 (six) hours as needed. 15 tablet 0  . lisinopril (PRINIVIL,ZESTRIL) 20 MG tablet TAKE 1 TABLET BY MOUTH EVERY DAY (Patient taking differently: at bedtime. ) 90 tablet 1  . LORazepam (ATIVAN) 1 MG tablet Take 1 tablet (1 mg total) by mouth as needed for anxiety (30 minutes prior to MRI and may repeat just prior to scan if needed). 2 tablet 0  . meloxicam (MOBIC) 15 MG tablet TAKE 1 TABLET BY MOUTH EVERY DAY (Patient not taking: Reported on 01/16/2018) 90 tablet 1  . Multiple Vitamin (MULTIVITAMIN WITH MINERALS) TABS tablet Take 1 tablet by mouth daily.    . phenazopyridine (PYRIDIUM) 200 MG tablet Take 1 tablet (200 mg total) by mouth 3 (three) times daily as needed for pain. 10 tablet 0  . predniSONE (DELTASONE) 10 MG tablet Take 10 mg by mouth daily with breakfast.    . tamsulosin (FLOMAX) 0.4 MG CAPS capsule Take 1 capsule (0.4 mg total) by mouth daily. 30 capsule 11   No current facility-administered medications for this encounter.     Physical Findings: In general this is a well appearing Caucasian male in no acute distress.  He's alert and oriented x4 and appropriate throughout the examination. Cardiopulmonary assessment is negative for acute distress and he exhibits normal effort.   Lab Findings: Lab Results  Component Value Date   WBC 10.3 04/04/2018   HGB 14.2 04/04/2018   HCT 43.7 04/04/2018   MCV 89.2 04/04/2018  PLT 253 04/04/2018    Radiographic Findings:  Patient underwent CT imaging in our clinic for post implant dosimetry. The CT was reviewed by Dr. [name] and appears to demonstrate an adequate distribution of radioactive seeds throughout the prostate gland. There are no seeds in or near the rectum. We suspect the final radiation plan and dosimetry will show appropriate coverage of the prostate gland.   Impression/Plan:  1. 68 y.o. gentleman with Stage T1c adenocarcinoma  of the prostate with Gleason Score of 3+4, and PSA of 5.43. The patient is recovering from the effects of radiation. His urinary symptoms should gradually improve over the next 4-6 months. We talked about this today. He is encouraged by his improvement already and is otherwise pleased with his outcome. We also talked about long-term follow-up for prostate cancer following seed implant. He understands that ongoing PSA determinations and digital rectal exams will help perform surveillance to rule out disease recurrence. He was recently evaluated at [facility] urology on 05/01/2018 and has a follow up appointment scheduled with [name] in 07/2018. He understands what to expect with his PSA measures.  He was encouraged to continue the Flomax twice daily as well as AZO over-the-counter as needed.  Patient was also educated today about some of the long-term effects from radiation including a small risk for rectal bleeding and possibly erectile dysfunction. We talked about some of the general management approaches to these potential complications. However, I did encourage the patient to contact our office or return at any point if he has questions or concerns related to his previous radiation and prostate cancer. 2. Claustrophobia.  The patient has severe claustrophobia is not confident that he will be able to tolerate his scheduled MRI of the prostate on Monday, 05/06/2018 despite premedication with Ativan as he has not had success with this in the past.  He does not want [place] to cancel this exam yet and wants to give it further consideration but will let us know early Monday morning whether or not he plans to move forward with this scan. 3. Rheumatoid arthritis.  The patient was previously managed with methotrexate and Enbrel under the care and direction of Dr. [name] at [facility] rheumatology.  These medications were stopped prior to the start of radiation and he has not yet resumed.  We would not advise resuming  methotrexate until he is approximately 3 months out from his brachytherapy procedure but will leave this to the discretion of his rheumatologist.  I will forward today's note to his rheumatologist for their records and further management.    Nicholos Johns, PA-C

## 2018-05-05 NOTE — Progress Notes (Signed)
  Radiation Oncology         [phone] ________________________________  Name: Thomas [name] MRN: [id number]  Date: 05/02/2018  DOB: 10-Aug-1950  COMPLEX SIMULATION NOTE  NARRATIVE:  The patient was brought to the [facility] today following prostate seed implantation approximately one month ago.  Identity was confirmed.  All relevant records and images related to the planned course of therapy were reviewed.  Then, the patient was set-up supine.  CT images were obtained.  The CT images were loaded into the planning software.  Then the prostate and rectum were contoured.  Treatment planning then occurred.  The implanted iodine 125 seeds were identified by the physics staff for projection of radiation distribution  I have requested : 3D Simulation  I have requested a DVH of the following structures: Prostate and rectum.    ________________________________  Sheral Apley [name], M.D.

## 2018-05-06 ENCOUNTER — Ambulatory Visit (HOSPITAL_COMMUNITY): Admission: RE | Admit: 2018-05-06 | Payer: Medicare Other | Source: Ambulatory Visit

## 2018-05-17 DIAGNOSIS — M25569 Pain in unspecified knee: Secondary | ICD-10-CM | POA: Diagnosis not present

## 2018-05-17 DIAGNOSIS — Z79899 Other long term (current) drug therapy: Secondary | ICD-10-CM | POA: Diagnosis not present

## 2018-05-17 DIAGNOSIS — Z79891 Long term (current) use of opiate analgesic: Secondary | ICD-10-CM | POA: Diagnosis not present

## 2018-05-17 DIAGNOSIS — G894 Chronic pain syndrome: Secondary | ICD-10-CM | POA: Diagnosis not present

## 2018-05-17 DIAGNOSIS — M069 Rheumatoid arthritis, unspecified: Secondary | ICD-10-CM | POA: Diagnosis not present

## 2018-05-23 ENCOUNTER — Telehealth: Payer: Self-pay | Admitting: *Deleted

## 2018-05-23 NOTE — Telephone Encounter (Signed)
Patient called stating he was urinating constantly,up every 45 minutes@ night, he isn't getting any sleep.  He has tried AZO but isn't helping.  Information reviewed with Ashlyn Bruning, PA and was instructed to direct the patient in taking his Flomax 2 capsules at dinner instead of one in morning and one at night.  This will be effective throughout the day since it is an extended release over 24 hours but will give him the peak affect at night and allow him to rest.  If he is already doing this then he needs to call for a follow up with Dr Louis Meckel At [facility] to ensure he is emptying his bladder well (no urinary retention) and/or r/o UTI.  Discussed with patient and he stated he would start this evening taking 2 capsules at bedtime.  He stated that [facility] already did an ultrasound and did not note any urinary retention and did cultures because of the burning and nothing grew.  He does report that he included cranberry juice into his evening routine of hydration and found a little bit more relief along with the AZO with the burning.    He will try and call back to reassess if for some reason the new plan does not work.

## 2018-05-30 ENCOUNTER — Ambulatory Visit: Payer: Medicare Other | Attending: Radiation Oncology | Admitting: Radiation Oncology

## 2018-05-30 ENCOUNTER — Encounter: Payer: Self-pay | Admitting: Radiation Oncology

## 2018-05-30 ENCOUNTER — Telehealth: Payer: Self-pay | Admitting: Radiation Oncology

## 2018-05-30 DIAGNOSIS — C61 Malignant neoplasm of prostate: Secondary | ICD-10-CM | POA: Diagnosis not present

## 2018-05-30 NOTE — Telephone Encounter (Signed)
Patient phoned back immediately. Patient reports continued dysuria and nocturia despite AZO and Flomax. Patient reports he just picked up the pyridium he was prescribed sometime ago. Instructed patient to hold fluids two hours before bed. Instructed patient to take pyridium one tab with breakfast, one tab with lunch and one tablet with two flomax at dinner. Explained the mechanism of action of Flomax. Also, discussed avoiding bladder irritants. Explained if relief isn't felt with these interventions after 72 hours to follow up with [name]. Patient verbalized understanding of all reviewed.

## 2018-05-30 NOTE — Telephone Encounter (Signed)
Received voicemail message from [place] that patient is requesting a return call. Phoned patient to inquire of need. No answer. Left voicemail message with my contact information. Awaiting return call.

## 2018-06-11 DIAGNOSIS — M25569 Pain in unspecified knee: Secondary | ICD-10-CM | POA: Diagnosis not present

## 2018-06-11 DIAGNOSIS — M069 Rheumatoid arthritis, unspecified: Secondary | ICD-10-CM | POA: Diagnosis not present

## 2018-06-11 DIAGNOSIS — G894 Chronic pain syndrome: Secondary | ICD-10-CM | POA: Diagnosis not present

## 2018-06-18 NOTE — Progress Notes (Signed)
  Radiation Oncology         [phone] ________________________________  Name: Thomas [name] MRN: [id number]  Date: 05/30/2018  DOB: 10/16/1950  3D Planning Note   Prostate Brachytherapy Post-Implant Dosimetry  Diagnosis:  68 y.o. gentleman with Stage T1c adenocarcinoma of the prostate with Gleason Score of 3+4, and PSA of 5.43.  Narrative: On a previous date, Thomas [name] returned following prostate seed implantation for post implant planning. He underwent CT scan complex simulation to delineate the three-dimensional structures of the pelvis and demonstrate the radiation distribution.  Since that time, the seed localization, and complex isodose planning with dose volume histograms have now been completed.  Results:   Prostate Coverage - The dose of radiation delivered to the 90% or more of the prostate gland (D90) was 106.31% of the prescription dose. This exceeds our goal of greater than 90%. Rectal Sparing - The volume of rectal tissue receiving the prescription dose or higher was 0.0 cc. This falls under our thresholds tolerance of 1.0 cc.  Impression: The prostate seed implant appears to show adequate target coverage and appropriate rectal sparing.  Plan:  The patient will continue to follow with urology for ongoing PSA determinations. I would anticipate a high likelihood for local tumor control with minimal risk for rectal morbidity.  ________________________________  Sheral Apley [name], M.D.

## 2018-06-24 DIAGNOSIS — M0579 Rheumatoid arthritis with rheumatoid factor of multiple sites without organ or systems involvement: Secondary | ICD-10-CM | POA: Diagnosis not present

## 2018-06-24 DIAGNOSIS — Z6828 Body mass index (BMI) 28.0-28.9, adult: Secondary | ICD-10-CM | POA: Diagnosis not present

## 2018-06-24 DIAGNOSIS — M150 Primary generalized (osteo)arthritis: Secondary | ICD-10-CM | POA: Diagnosis not present

## 2018-06-24 DIAGNOSIS — C61 Malignant neoplasm of prostate: Secondary | ICD-10-CM | POA: Diagnosis not present

## 2018-07-08 DIAGNOSIS — M25569 Pain in unspecified knee: Secondary | ICD-10-CM | POA: Diagnosis not present

## 2018-07-08 DIAGNOSIS — Z79891 Long term (current) use of opiate analgesic: Secondary | ICD-10-CM | POA: Diagnosis not present

## 2018-07-08 DIAGNOSIS — Z79899 Other long term (current) drug therapy: Secondary | ICD-10-CM | POA: Diagnosis not present

## 2018-07-08 DIAGNOSIS — M069 Rheumatoid arthritis, unspecified: Secondary | ICD-10-CM | POA: Diagnosis not present

## 2018-07-08 DIAGNOSIS — G894 Chronic pain syndrome: Secondary | ICD-10-CM | POA: Diagnosis not present

## 2018-07-09 ENCOUNTER — Telehealth: Payer: Self-pay | Admitting: Urology

## 2018-07-09 NOTE — Telephone Encounter (Signed)
I called and spoke with Thomas [name] this morning, returning his phone call regarding questions he had about resuming his treatment for rheumatoid arthritis now that he is approximately 3 months out from brachytherapy for treatment of his prostate cancer.  He reports that his rheumatologist, Dr. [name] at [facility] rheumatology, has been hesitant to allow him to resume his previous RA treatment which was Enbrel and methotrexate given his recent radiotherapy.  Thomas [name] request that I call Dr. [name] directly to discuss recommendations for resuming treatment following radiation.  I advised that I am happy to do so.  I also advised that I did forward a copy of our last posttreatment office visit from January 2020 which did include recommendations to resume his RA treatments approximately 3 months out from brachytherapy procedure which would be now, since his brachytherapy procedure was performed on 04/11/2018. However, I also informed Thomas [name] that it is ultimately up to the discretion of his rheumatologist as to when they are comfortable resuming treatment.  He reports his understanding of this conversation and our recommendations and is in agreement with the stated plan.  I have called and left a message for Dr. [name] at [facility] rheumatology to return my call to discuss this further.   Nicholos Johns, MMS, PA-C [facility] [place]: [phone]  Fax: [phone]

## 2018-08-05 DIAGNOSIS — M25569 Pain in unspecified knee: Secondary | ICD-10-CM | POA: Diagnosis not present

## 2018-08-05 DIAGNOSIS — M069 Rheumatoid arthritis, unspecified: Secondary | ICD-10-CM | POA: Diagnosis not present

## 2018-08-05 DIAGNOSIS — G894 Chronic pain syndrome: Secondary | ICD-10-CM | POA: Diagnosis not present

## 2018-08-05 DIAGNOSIS — M25539 Pain in unspecified wrist: Secondary | ICD-10-CM | POA: Diagnosis not present

## 2018-08-30 DIAGNOSIS — M069 Rheumatoid arthritis, unspecified: Secondary | ICD-10-CM | POA: Diagnosis not present

## 2018-08-30 DIAGNOSIS — M79643 Pain in unspecified hand: Secondary | ICD-10-CM | POA: Diagnosis not present

## 2018-08-30 DIAGNOSIS — Z79891 Long term (current) use of opiate analgesic: Secondary | ICD-10-CM | POA: Diagnosis not present

## 2018-08-30 DIAGNOSIS — Z79899 Other long term (current) drug therapy: Secondary | ICD-10-CM | POA: Diagnosis not present

## 2018-08-30 DIAGNOSIS — M25569 Pain in unspecified knee: Secondary | ICD-10-CM | POA: Diagnosis not present

## 2018-08-30 DIAGNOSIS — G894 Chronic pain syndrome: Secondary | ICD-10-CM | POA: Diagnosis not present

## 2018-09-06 ENCOUNTER — Telehealth: Payer: Self-pay | Admitting: *Deleted

## 2018-09-06 MED ORDER — SULFAMETHOXAZOLE-TRIMETHOPRIM 800-160 MG PO TABS: 1.0000 | ORAL_TABLET | Freq: Two times a day (BID) | ORAL | 0 refills | Status: DC

## 2018-09-06 NOTE — Telephone Encounter (Signed)
Pt states he saw Dr. Paulla Dolly in 2018 and is having an issue.

## 2018-09-06 NOTE — Telephone Encounter (Signed)
Pt states he was given a medication that started with sulf by Dr. Paulla Dolly and it had almost cleared the infection between his right fourth and fifth toes, and he would like a refill.  I reviewed pt's clinicals and 02/08/2017 Dr. Paulla Dolly had ordered Bactrim DS. I spoke with Dr. Jacqualyn Posey and he okayed Bactrim DS #8 to get pt to an appt with Dr. Paulla Dolly on 09/09/2018 and for pt to send a picture. I informed pt and he states there isn't anything to see really yet. I informed pt of the Bactrim DS orders to get him to the 09/09/2018 appt.

## 2018-09-09 ENCOUNTER — Ambulatory Visit (INDEPENDENT_AMBULATORY_CARE_PROVIDER_SITE_OTHER): Payer: Medicare Other | Admitting: Podiatry

## 2018-09-09 ENCOUNTER — Ambulatory Visit (INDEPENDENT_AMBULATORY_CARE_PROVIDER_SITE_OTHER): Payer: Medicare Other

## 2018-09-09 ENCOUNTER — Encounter: Payer: Self-pay | Admitting: Podiatry

## 2018-09-09 ENCOUNTER — Other Ambulatory Visit: Payer: Self-pay

## 2018-09-09 ENCOUNTER — Other Ambulatory Visit: Payer: Self-pay | Admitting: Podiatry

## 2018-09-09 VITALS — Temp 100.4°F

## 2018-09-09 DIAGNOSIS — L03031 Cellulitis of right toe: Secondary | ICD-10-CM

## 2018-09-09 DIAGNOSIS — M79671 Pain in right foot: Secondary | ICD-10-CM | POA: Diagnosis not present

## 2018-09-11 NOTE — Progress Notes (Signed)
Subjective:   Patient ID: Thomas [name], male   DOB: 68 y.o.   MRN: [id number]   HPI Patient presents concerned because there is been some swelling on top of the right foot and into the side and he started soften it seems to be somewhat better   ROS      Objective:  Physical Exam  Neurovascular status intact with mild dorsal swelling of the midfoot right with mild discomfort and irritation between the fourth and fifth toes but no drainage or redness noted     Assessment:  Possibility for low-grade stress fracture or other inflammatory condition midfoot right with slight irritation between the fourth and fifth toes     Plan:  H&P x-rays reviewed and advised on continued soaks compression therapy and symptoms should go away but needs to be seen back if anything were to occur  X-rays were negative for signs of bony encroachment or injury or no indication of stress fracture

## 2018-09-13 ENCOUNTER — Telehealth: Payer: Self-pay | Admitting: *Deleted

## 2018-09-13 DIAGNOSIS — Z8546 Personal history of malignant neoplasm of prostate: Secondary | ICD-10-CM | POA: Diagnosis not present

## 2018-09-13 MED ORDER — SULFAMETHOXAZOLE-TRIMETHOPRIM 800-160 MG PO TABS: 1.0000 | ORAL_TABLET | Freq: Two times a day (BID) | ORAL | 0 refills | Status: DC

## 2018-09-13 NOTE — Telephone Encounter (Signed)
Pt states he was seen earlier this week by Dr. Paulla Dolly and has completed the supplement Dr. Paulla Dolly ordered and the foot is some better but still has swelling. I reviewed pt's clinicals of 09/09/2018 and pt has a low grade stress fracture to the foot and irritation between two toes. I told pt with him having two problems that potentially caused swelling, he may have swelling a little longer. Pt states he is cleaning the area between the toes with alcohol and placing cotton between.I told pt to keep the area of irritation between the toes dried and if feet get wet get socks and shoes dry, may need to change frequently. I told pt to continue with the cotton between the toes but to stop the alcohol, in case the area got too dry and cracked again. Dr. Paulla Dolly ordered refill the [facility] DS #[id number]. Orders to [id number].

## 2018-09-13 NOTE — Telephone Encounter (Signed)
I informed pt of Dr. Mellody Drown orders of 7 days of Bactrim.

## 2018-09-19 ENCOUNTER — Other Ambulatory Visit: Payer: Self-pay | Admitting: Family Medicine

## 2018-09-20 DIAGNOSIS — N5201 Erectile dysfunction due to arterial insufficiency: Secondary | ICD-10-CM | POA: Diagnosis not present

## 2018-09-20 DIAGNOSIS — R3915 Urgency of urination: Secondary | ICD-10-CM | POA: Diagnosis not present

## 2018-09-20 DIAGNOSIS — Z8546 Personal history of malignant neoplasm of prostate: Secondary | ICD-10-CM | POA: Diagnosis not present

## 2018-09-20 DIAGNOSIS — E291 Testicular hypofunction: Secondary | ICD-10-CM | POA: Diagnosis not present

## 2018-09-26 DIAGNOSIS — M150 Primary generalized (osteo)arthritis: Secondary | ICD-10-CM | POA: Diagnosis not present

## 2018-09-26 DIAGNOSIS — C61 Malignant neoplasm of prostate: Secondary | ICD-10-CM | POA: Diagnosis not present

## 2018-09-26 DIAGNOSIS — M0579 Rheumatoid arthritis with rheumatoid factor of multiple sites without organ or systems involvement: Secondary | ICD-10-CM | POA: Diagnosis not present

## 2018-09-26 DIAGNOSIS — M79604 Pain in right leg: Secondary | ICD-10-CM | POA: Diagnosis not present

## 2018-09-27 DIAGNOSIS — M79643 Pain in unspecified hand: Secondary | ICD-10-CM | POA: Diagnosis not present

## 2018-09-27 DIAGNOSIS — M25569 Pain in unspecified knee: Secondary | ICD-10-CM | POA: Diagnosis not present

## 2018-09-27 DIAGNOSIS — M069 Rheumatoid arthritis, unspecified: Secondary | ICD-10-CM | POA: Diagnosis not present

## 2018-09-27 DIAGNOSIS — G894 Chronic pain syndrome: Secondary | ICD-10-CM | POA: Diagnosis not present

## 2018-09-29 ENCOUNTER — Encounter (HOSPITAL_COMMUNITY): Payer: Self-pay

## 2018-09-29 ENCOUNTER — Emergency Department (HOSPITAL_COMMUNITY)
Admission: EM | Admit: 2018-09-29 | Discharge: 2018-09-29 | Discharge status: Against Medical Advice | Payer: No Typology Code available for payment source | Attending: Emergency Medicine | Admitting: Emergency Medicine

## 2018-09-29 ENCOUNTER — Telehealth: Payer: Self-pay | Admitting: Family Medicine

## 2018-09-29 ENCOUNTER — Emergency Department (HOSPITAL_COMMUNITY): Payer: No Typology Code available for payment source

## 2018-09-29 ENCOUNTER — Other Ambulatory Visit: Payer: Self-pay

## 2018-09-29 DIAGNOSIS — Z79899 Other long term (current) drug therapy: Secondary | ICD-10-CM | POA: Diagnosis not present

## 2018-09-29 DIAGNOSIS — S2222XA Fracture of body of sternum, initial encounter for closed fracture: Secondary | ICD-10-CM | POA: Insufficient documentation

## 2018-09-29 DIAGNOSIS — R000 Tachycardia, unspecified: Secondary | ICD-10-CM | POA: Diagnosis not present

## 2018-09-29 DIAGNOSIS — I10 Essential (primary) hypertension: Secondary | ICD-10-CM | POA: Insufficient documentation

## 2018-09-29 DIAGNOSIS — R079 Chest pain, unspecified: Secondary | ICD-10-CM | POA: Diagnosis not present

## 2018-09-29 DIAGNOSIS — Y998 Other external cause status: Secondary | ICD-10-CM | POA: Diagnosis not present

## 2018-09-29 DIAGNOSIS — Y93I9 Activity, other involving external motion: Secondary | ICD-10-CM | POA: Insufficient documentation

## 2018-09-29 DIAGNOSIS — S299XXA Unspecified injury of thorax, initial encounter: Secondary | ICD-10-CM | POA: Diagnosis not present

## 2018-09-29 DIAGNOSIS — Y92410 Unspecified street and highway as the place of occurrence of the external cause: Secondary | ICD-10-CM | POA: Insufficient documentation

## 2018-09-29 LAB — CBC WITH DIFFERENTIAL/PLATELET
Abs Immature Granulocytes: 0.03 10*3/uL (ref 0.00–0.07)
Basophils Absolute: 0 10*3/uL (ref 0.0–0.1)
Basophils Relative: 0 %
Eosinophils Absolute: 0.1 10*3/uL (ref 0.0–0.5)
Eosinophils Relative: 1 %
HCT: 42.3 % (ref 39.0–52.0)
Hemoglobin: 13.4 g/dL (ref 13.0–17.0)
Immature Granulocytes: 0 %
Lymphocytes Relative: 6 %
Lymphs Abs: 0.5 10*3/uL — ABNORMAL LOW (ref 0.7–4.0)
MCH: 28.9 pg (ref 26.0–34.0)
MCHC: 31.7 g/dL (ref 30.0–36.0)
MCV: 91.4 fL (ref 80.0–100.0)
Monocytes Absolute: 0.5 10*3/uL (ref 0.1–1.0)
Monocytes Relative: 6 %
Neutro Abs: 7 10*3/uL (ref 1.7–7.7)
Neutrophils Relative %: 87 %
Platelets: 228 10*3/uL (ref 150–400)
RBC: 4.63 MIL/uL (ref 4.22–5.81)
RDW: 13.3 % (ref 11.5–15.5)
WBC: 8.1 10*3/uL (ref 4.0–10.5)
nRBC: 0 % (ref 0.0–0.2)

## 2018-09-29 LAB — COMPREHENSIVE METABOLIC PANEL
ALT: 24 U/L (ref 0–44)
AST: 27 U/L (ref 15–41)
Albumin: 3.7 g/dL (ref 3.5–5.0)
Alkaline Phosphatase: 69 U/L (ref 38–126)
Anion gap: 13 (ref 5–15)
BUN: 23 mg/dL (ref 8–23)
CO2: 25 mmol/L (ref 22–32)
Calcium: 9.3 mg/dL (ref 8.9–10.3)
Chloride: 100 mmol/L (ref 98–111)
Creatinine, Ser: 1.02 mg/dL (ref 0.61–1.24)
GFR calc Af Amer: >60 mL/min (ref >60)
GFR calc non Af Amer: >60 mL/min (ref >60)
Glucose, Bld: 99 mg/dL (ref 70–99)
Potassium: 3.9 mmol/L (ref 3.5–5.1)
Sodium: 138 mmol/L (ref 135–145)
Total Bilirubin: 1.2 mg/dL (ref 0.3–1.2)
Total Protein: 6.3 g/dL — ABNORMAL LOW (ref 6.5–8.1)
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
## 2018-09-29 MED ORDER — OXYCODONE-ACETAMINOPHEN 5-325 MG PO TABS
2.0000 | ORAL_TABLET | Freq: Once | ORAL | Status: AC
  Administered 2018-09-29: 2 via ORAL
  Filled 2018-09-29: qty 2

## 2018-09-29 NOTE — ED Notes (Signed)
Pt's son Thomas [name], please call with an update.

## 2018-09-29 NOTE — ED Notes (Signed)
Pt walked out of building without telling anyone he was leaving. PA made aware.
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
## 2018-09-30 ENCOUNTER — Other Ambulatory Visit: Payer: Self-pay

## 2018-09-30 ENCOUNTER — Ambulatory Visit (INDEPENDENT_AMBULATORY_CARE_PROVIDER_SITE_OTHER): Payer: Medicare Other | Admitting: Student in an Organized Health Care Education/Training Program

## 2018-09-30 VITALS — BP 118/62 | HR 90

## 2018-09-30 DIAGNOSIS — R0789 Other chest pain: Secondary | ICD-10-CM | POA: Diagnosis not present
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
## 2018-09-30 NOTE — Assessment & Plan Note (Addendum)
Patient has point tenderness over his chest wall. Otherwise he is feeling fine. He is unsure why his wife called to report that he was not acting himself. He would like to go back to work and asks if this is allowed. Neuro exam is nonfocal and he speaks in full sentences, appropriate during the encounter. Engaged in shared decision making, will hold off advanced imaging for now. - strict return precautions for worsening pain, dyspnea, headache, or AMS - ace bandage for wrapping when he is at work - advise against heavy lifting - PCP available to precept for this visit
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
## 2018-09-30 NOTE — Patient Instructions (Signed)
It was a pleasure seeing you today in our clinic. Here is the treatment plan we have discussed and agreed upon together:  If you develop shortness of breath, worsening chest pain, or any confusion, please call us right away.   Our clinic's number is [phone]. Please call with questions or concerns about what we discussed today.  Be well, Dr. Burr Medico

## 2018-10-02 ENCOUNTER — Telehealth: Payer: Self-pay | Admitting: Family Medicine

## 2018-10-02 NOTE — Telephone Encounter (Signed)
Called him He is feeling ok although chest still very sore Using Ace which helps some No headache or shortness of breath or fever Sounds normal on the phone - no confusion

## 2018-10-23 ENCOUNTER — Telehealth: Payer: Self-pay
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
## 2018-10-28 NOTE — Telephone Encounter (Signed)
LVM for pt to call back to inform him of below and assist him in getting an appointment scheduled if this is what he would like to do. April Zimmerman Rumple, CMA
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
## 2018-11-01 DIAGNOSIS — M79643 Pain in unspecified hand: Secondary | ICD-10-CM | POA: Diagnosis not present

## 2018-11-01 DIAGNOSIS — M25569 Pain in unspecified knee: Secondary | ICD-10-CM | POA: Diagnosis not present

## 2018-11-01 DIAGNOSIS — Z79891 Long term (current) use of opiate analgesic: Secondary | ICD-10-CM | POA: Diagnosis not present

## 2018-11-01 DIAGNOSIS — M069 Rheumatoid arthritis, unspecified: Secondary | ICD-10-CM | POA: Diagnosis not present

## 2018-11-01 DIAGNOSIS — G894 Chronic pain syndrome: Secondary | ICD-10-CM | POA: Diagnosis not present

## 2018-11-01 DIAGNOSIS — Z79899 Other long term (current) drug therapy: Secondary | ICD-10-CM | POA: Diagnosis not present

## 2018-11-07 ENCOUNTER — Telehealth: Payer: Self-pay | Admitting: *Deleted
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
## 2018-11-07 NOTE — Telephone Encounter (Signed)
Spoke with wife that patient is confused and stumbling and forgetful for the last few days His speech seems slowed.  He agrees to go to ER to be evaluated

## 2018-11-29 DIAGNOSIS — Z79899 Other long term (current) drug therapy: Secondary | ICD-10-CM | POA: Diagnosis not present

## 2018-11-29 DIAGNOSIS — G894 Chronic pain syndrome: Secondary | ICD-10-CM | POA: Diagnosis not present

## 2018-11-29 DIAGNOSIS — Z96653 Presence of artificial knee joint, bilateral: Secondary | ICD-10-CM | POA: Diagnosis not present

## 2018-11-29 DIAGNOSIS — M069 Rheumatoid arthritis, unspecified: Secondary | ICD-10-CM | POA: Diagnosis not present

## 2018-11-29 DIAGNOSIS — M25561 Pain in right knee: Secondary | ICD-10-CM | POA: Diagnosis not present

## 2018-11-29 DIAGNOSIS — Z79891 Long term (current) use of opiate analgesic: Secondary | ICD-10-CM | POA: Diagnosis not present

## 2018-12-05 ENCOUNTER — Other Ambulatory Visit: Payer: Self-pay | Admitting: Family Medicine

## 2018-12-09 DIAGNOSIS — Z8546 Personal history of malignant neoplasm of prostate: Secondary | ICD-10-CM | POA: Diagnosis not present

## 2018-12-26 ENCOUNTER — Other Ambulatory Visit: Payer: Self-pay | Admitting: Family Medicine

## 2018-12-26 DIAGNOSIS — Z8546 Personal history of malignant neoplasm of prostate: Secondary | ICD-10-CM | POA: Diagnosis not present

## 2018-12-27 DIAGNOSIS — M25551 Pain in right hip: Secondary | ICD-10-CM | POA: Diagnosis not present

## 2018-12-27 DIAGNOSIS — G894 Chronic pain syndrome: Secondary | ICD-10-CM | POA: Diagnosis not present

## 2018-12-27 DIAGNOSIS — M25561 Pain in right knee: Secondary | ICD-10-CM | POA: Diagnosis not present

## 2018-12-27 DIAGNOSIS — M069 Rheumatoid arthritis, unspecified: Secondary | ICD-10-CM | POA: Diagnosis not present

## 2019-01-01 DIAGNOSIS — M0579 Rheumatoid arthritis with rheumatoid factor of multiple sites without organ or systems involvement: Secondary | ICD-10-CM | POA: Diagnosis not present

## 2019-01-01 DIAGNOSIS — C61 Malignant neoplasm of prostate: Secondary | ICD-10-CM | POA: Diagnosis not present

## 2019-01-01 DIAGNOSIS — M150 Primary generalized (osteo)arthritis: Secondary | ICD-10-CM | POA: Diagnosis not present

## 2019-01-01 DIAGNOSIS — M79604 Pain in right leg: Secondary | ICD-10-CM | POA: Diagnosis not present

## 2019-01-20 ENCOUNTER — Other Ambulatory Visit: Payer: Self-pay | Admitting: Pain Medicine

## 2019-01-20 ENCOUNTER — Ambulatory Visit
Admission: RE | Admit: 2019-01-20 | Discharge: 2019-01-20 | Disposition: A | Discharge status: Home / Self Care | Payer: Self-pay | Source: Ambulatory Visit | Attending: Pain Medicine | Admitting: Pain Medicine

## 2019-01-20 ENCOUNTER — Other Ambulatory Visit: Payer: Self-pay

## 2019-01-20 DIAGNOSIS — S79911A Unspecified injury of right hip, initial encounter: Secondary | ICD-10-CM | POA: Diagnosis not present

## 2019-01-20 DIAGNOSIS — M25551 Pain in right hip: Secondary | ICD-10-CM
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
## 2019-01-22 ENCOUNTER — Ambulatory Visit: Payer: Medicare Other | Admitting: Family Medicine

## 2019-01-23 DIAGNOSIS — M25561 Pain in right knee: Secondary | ICD-10-CM | POA: Diagnosis not present

## 2019-01-23 DIAGNOSIS — G894 Chronic pain syndrome: Secondary | ICD-10-CM | POA: Diagnosis not present

## 2019-01-23 DIAGNOSIS — M25551 Pain in right hip: Secondary | ICD-10-CM | POA: Diagnosis not present

## 2019-01-23 DIAGNOSIS — M069 Rheumatoid arthritis, unspecified: Secondary | ICD-10-CM | POA: Diagnosis not present

## 2019-01-29 ENCOUNTER — Ambulatory Visit: Payer: Medicare Other | Admitting: Family Medicine

## 2019-01-29 ENCOUNTER — Other Ambulatory Visit: Payer: Self-pay

## 2019-01-29 ENCOUNTER — Encounter: Payer: Self-pay | Admitting: Family Medicine

## 2019-01-29 VITALS — BP 140/80 | HR 77

## 2019-01-29 DIAGNOSIS — L98499 Non-pressure chronic ulcer of skin of other sites with unspecified severity: Secondary | ICD-10-CM | POA: Diagnosis not present

## 2019-01-29 DIAGNOSIS — I10 Essential (primary) hypertension: Secondary | ICD-10-CM

## 2019-01-29 DIAGNOSIS — Z1211 Encounter for screening for malignant neoplasm of colon: Secondary | ICD-10-CM | POA: Diagnosis not present
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
## 2019-01-29 NOTE — Patient Instructions (Addendum)
Good to see you today!  Thanks for coming in.  Call [facility] (rheumatologist) about the skin ulcer and Methotrexate and prednisione  Use vaseline twice a day and wear long sleeve shirts   I will refer you to a gastroenterologist for a colonoscopy for your positive stool card to check for cancer  See [facility] about flu and pneumovax

## 2019-01-29 NOTE — Assessment & Plan Note (Signed)
Of both forearms in sun exposed distribution.  Likely combination of sun exposure and chronic mtx and prednisone.  Needs to have labs checked.  He will contact his rheumatologist Dr [name] sp? Discussed could be very serious

## 2019-01-29 NOTE — Assessment & Plan Note (Signed)
BP Readings from Last 3 Encounters:  01/29/19 140/80  09/30/18 118/62  09/29/18 (!) 169/156   At goal today.  Recent labs good Continue medications

## 2019-01-29 NOTE — Addendum Note (Signed)
Addended by: Talbert Cage L on: 01/29/2019 09:53 AM   Modules accepted: Orders

## 2019-02-17 ENCOUNTER — Other Ambulatory Visit: Payer: Self-pay

## 2019-02-17 ENCOUNTER — Ambulatory Visit (HOSPITAL_COMMUNITY)
Admission: EM | Admit: 2019-02-17 | Discharge: 2019-02-17 | Disposition: A | Discharge status: Home / Self Care | Payer: Medicare Other | Attending: Family Medicine | Admitting: Family Medicine

## 2019-02-17 ENCOUNTER — Ambulatory Visit (INDEPENDENT_AMBULATORY_CARE_PROVIDER_SITE_OTHER): Payer: Medicare Other

## 2019-02-17 ENCOUNTER — Encounter (HOSPITAL_COMMUNITY): Payer: Self-pay

## 2019-02-17 DIAGNOSIS — Z23 Encounter for immunization: Secondary | ICD-10-CM

## 2019-02-17 DIAGNOSIS — S61412A Laceration without foreign body of left hand, initial encounter: Secondary | ICD-10-CM

## 2019-02-17 DIAGNOSIS — W458XXA Other foreign body or object entering through skin, initial encounter: Secondary | ICD-10-CM

## 2019-02-17 DIAGNOSIS — S6992XA Unspecified injury of left wrist, hand and finger(s), initial encounter: Secondary | ICD-10-CM | POA: Diagnosis not present

## 2019-02-17 DIAGNOSIS — M79642 Pain in left hand: Secondary | ICD-10-CM | POA: Diagnosis not present

## 2019-02-17 IMAGING — DX DG HAND COMPLETE 3+V*L*
3 series · 3 of 3 positions shown · non-contrast
Comparison: [DATE] left wrist

CLINICAL DATA: Left hand wrist pain, trauma, injury

EXAM:
LEFT HAND - COMPLETE 3+ VIEW

[hand pa]
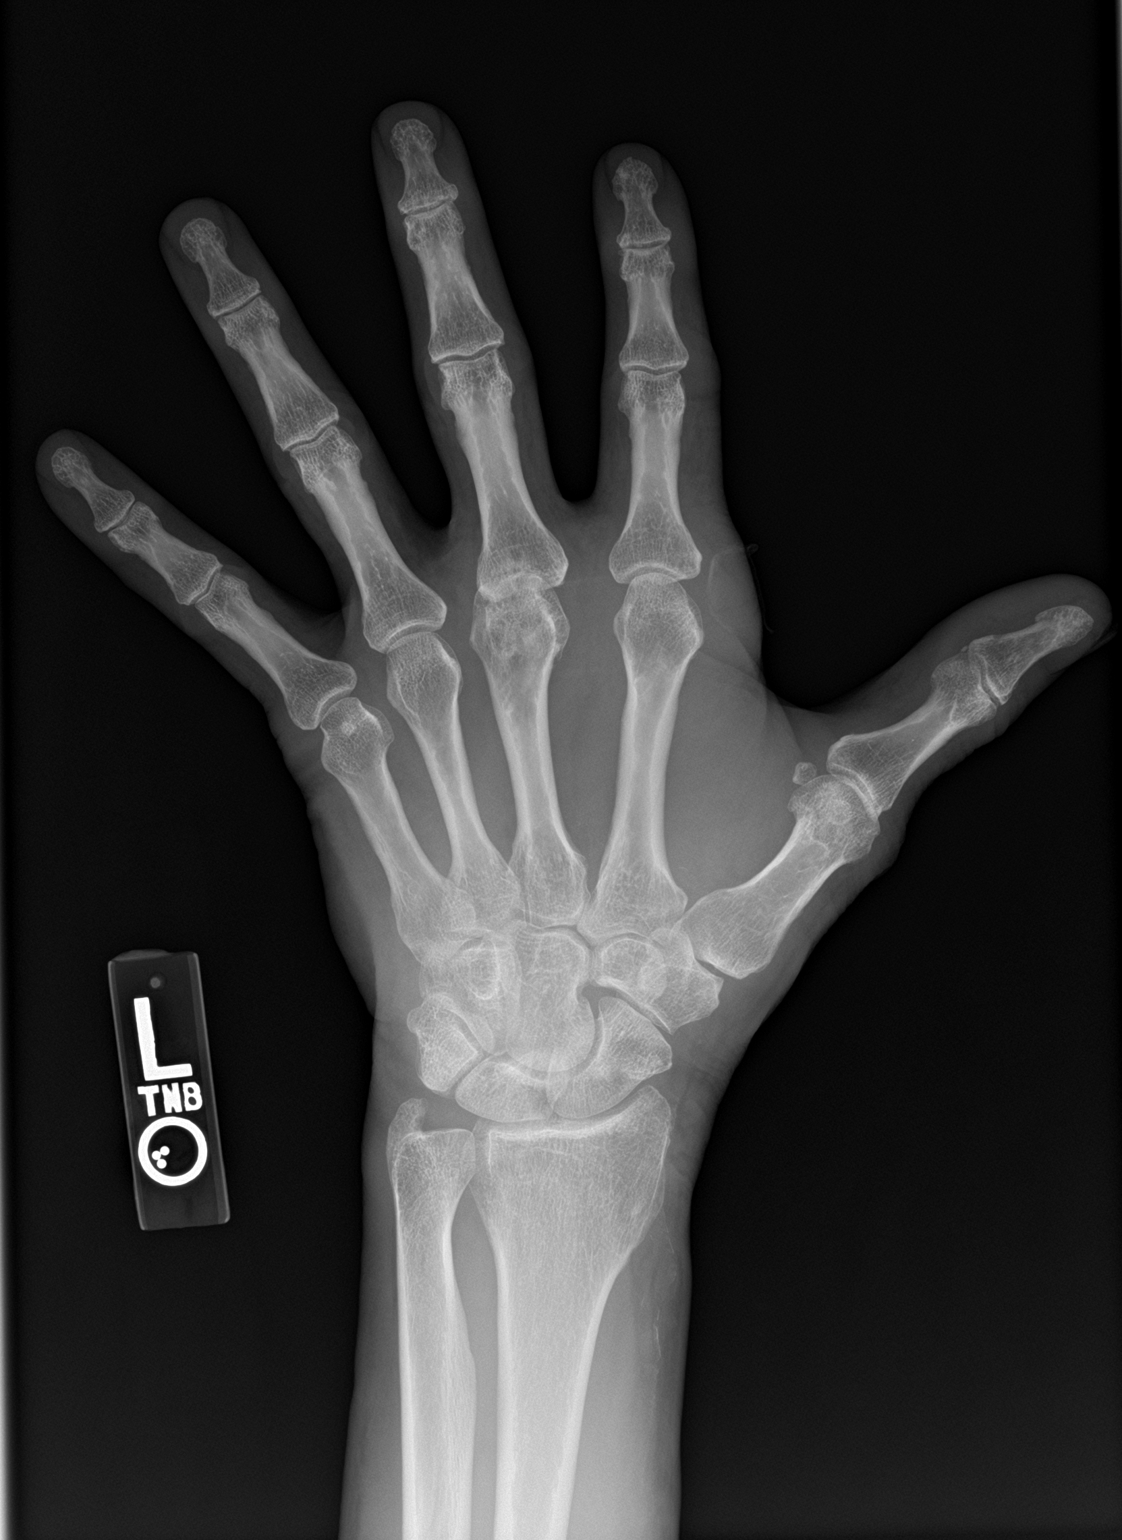

[hand obl]
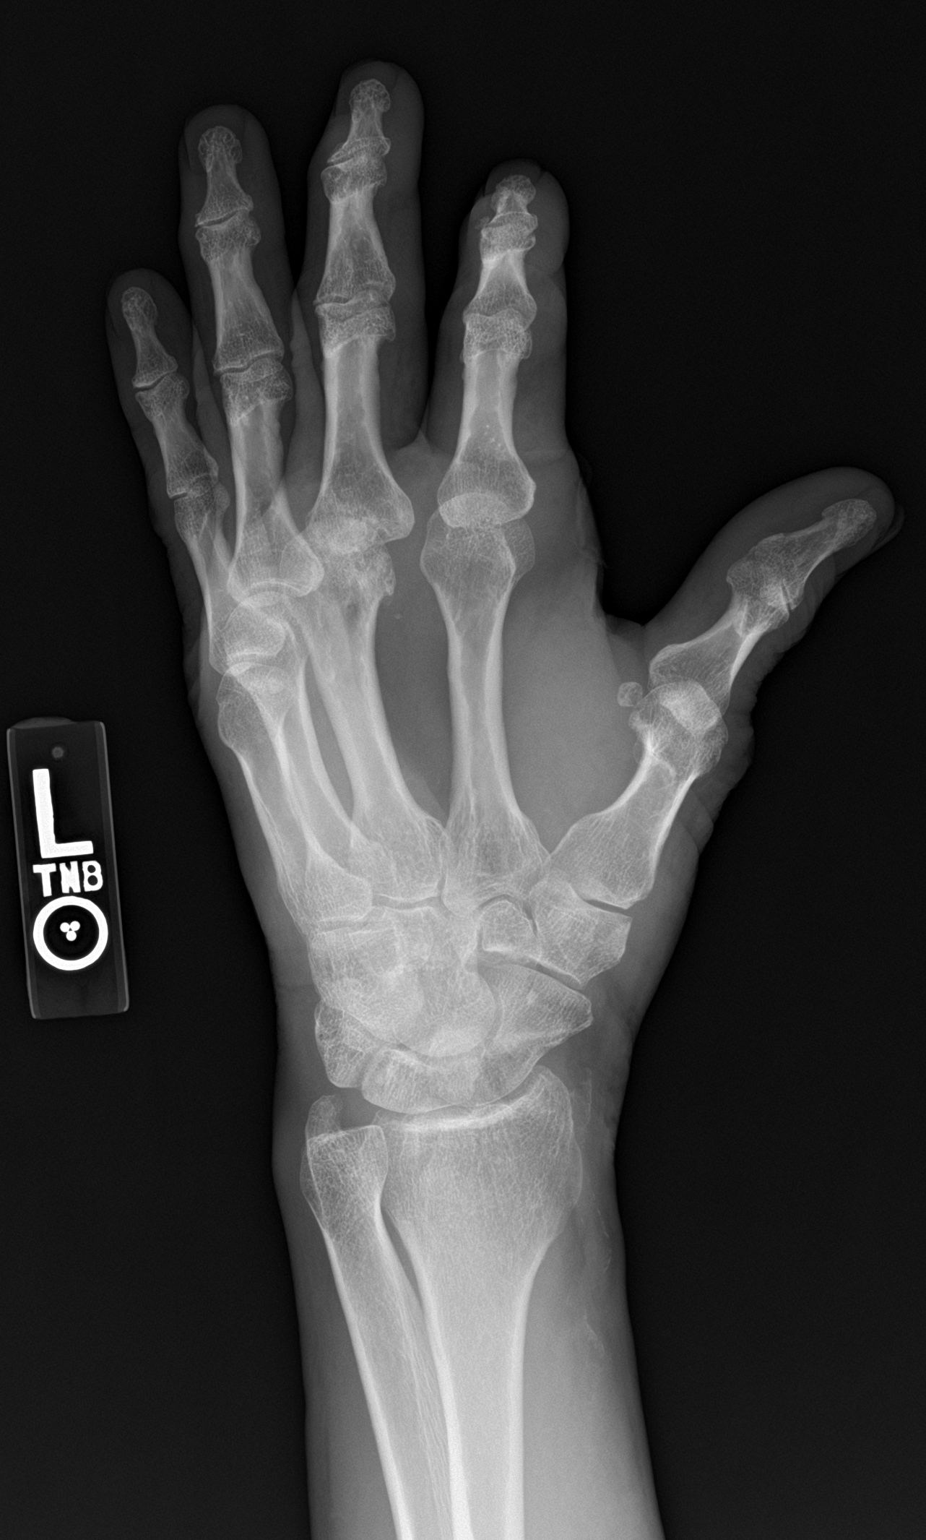

[hand lat]
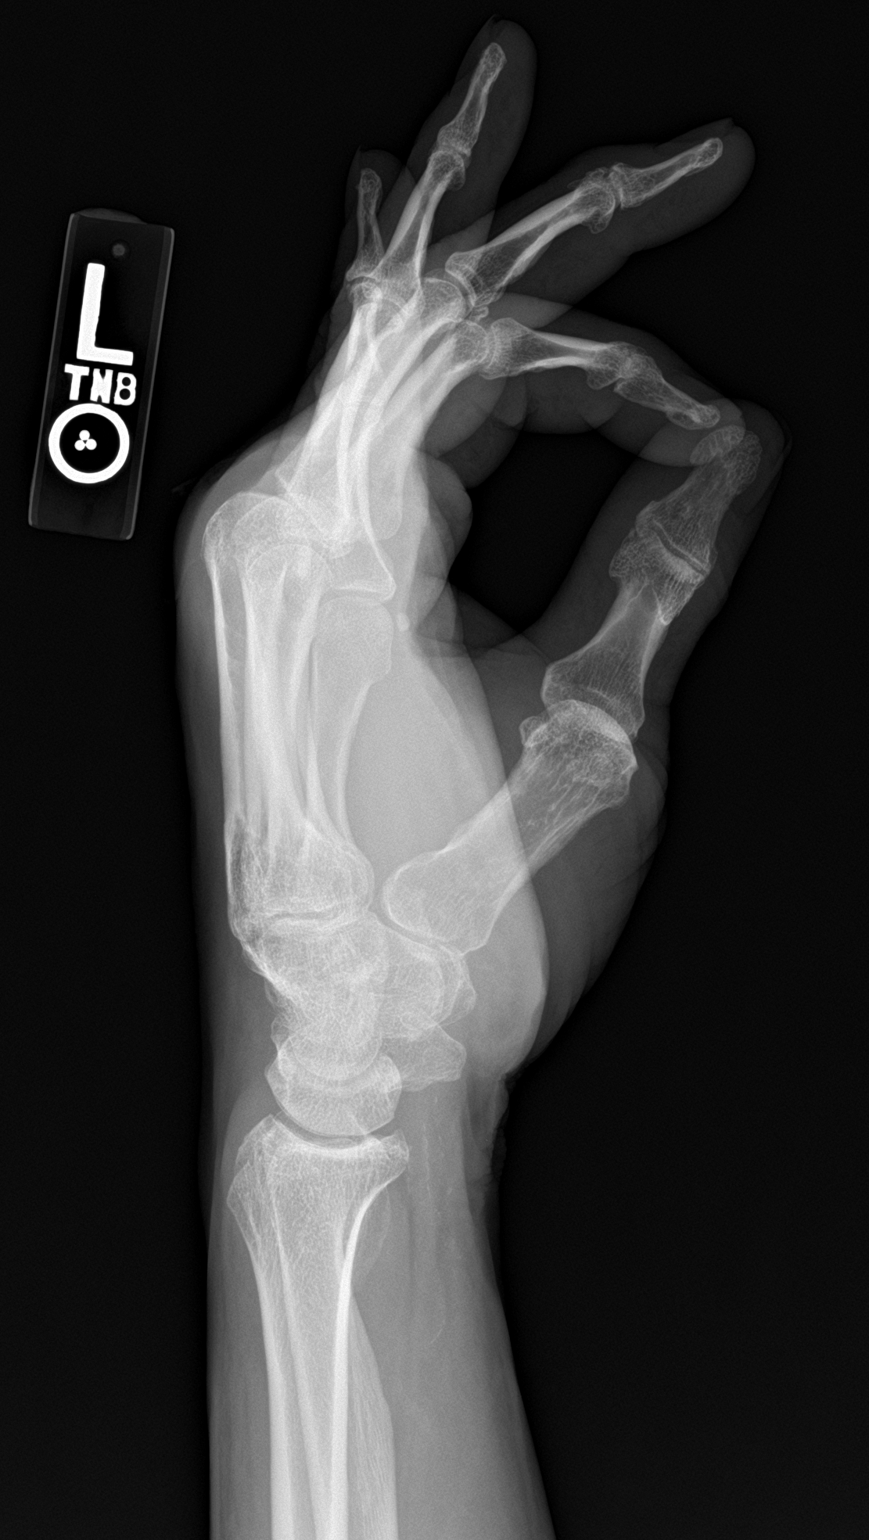

[3 of 3 positions shown; findings below may reference images not displayed]

FINDINGS: Mild diffuse degenerative changes most pronounced of the left first
and third MCP joints with joint space loss, sclerosis and bony
spurring. Minor arthritic changes of the interphalangeal joints
diffusely. No acute osseous finding, fracture, malalignment.
Peripheral atherosclerosis noted at the wrist. No definite
radiopaque soft tissue foreign body.
IMPRESSION: Degenerative changes as above. No acute finding by plain
radiography.

## 2019-02-17 MED ORDER — TETANUS-DIPHTH-ACELL PERTUSSIS 5-2.5-18.5 LF-MCG/0.5 IM SUSP
0.5000 mL | Freq: Once | INTRAMUSCULAR | Status: AC
  Administered 2019-02-17: 0.5 mL via INTRAMUSCULAR

## 2019-02-17 MED ORDER — TETANUS-DIPHTH-ACELL PERTUSSIS 5-2.5-18.5 LF-MCG/0.5 IM SUSP
INTRAMUSCULAR | Status: AC
  Filled 2019-02-17: qty 0.5

## 2019-02-17 NOTE — ED Triage Notes (Signed)
Patient presents to Urgent Care with complaints of laceration to his left hand since this afternoon. Patient reports he cut it on a saw, thinks his tetanus is more than 66 years old.

## 2019-02-17 NOTE — Discharge Instructions (Signed)
He the dressing on for 24 hours.  You can take the dressing off tomorrow and apply more antibiotic ointment and redress. Keep clean and dry.   Watch for signs of infection to include increased pain at the site, redness, swelling or drainage. Come back in here in 7 to 10 days for removal of the stitches We updated your tetanus today

## 2019-02-17 NOTE — ED Provider Notes (Signed)
[place]    CSN: [id number] Arrival date & time: 02/17/19  1320      History   Chief Complaint Chief Complaint  Patient presents with  . Extremity Laceration    HPI Thomas [name] is a 68 y.o. male.   Pt is a 68 year old male that presents with laceration to the left hand. This occurred PTA. Reports he cut his hand with a saw. Bleeding controlled on arrival. The laceration is on the palmer aspect of the left hand extending into the left wrist. No numbness, tingling. Good ROM. Unsure about tetanus.   ROS per HPI      Past Medical History:  Diagnosis Date  . [facility])    prostate  . Claustrophobia    quite severe  . Hypertension   . Hypoglycemia    occ  . Left knee DJD    Xray 12/23/08  . Prostate cancer [facility])     Patient Active Problem List   Diagnosis Date Noted  . Skin ulcer ([place]) 01/29/2019  . Malignant neoplasm of prostate ([place]) 01/16/2018  . Insomnia 10/02/2017  . Cyclic citrullinated peptide (CCP) antibody positive 05/31/2016  . S/P bilateral TKA 09/13/2015  . Other bilateral secondary osteoarthritis of knee 07/14/2014  . Heme positive stool 05/21/2013  . Osteoarthritis, multiple sites 01/13/2011  . Back pain 01/13/2011  . Hypertension 12/06/2010  . ED (erectile dysfunction) 08/25/2010  . CLAUSTROPHOBIA 03/01/2010    Past Surgical History:  Procedure Laterality Date  . CYSTOSCOPY  04/11/2018   Procedure: CYSTOSCOPY FLEXIBLE;  Surgeon: [name], MD;  Location: [facility]  Service: Urology;;  NO SEEDS FOUND IN BLADDER  . HERNIA REPAIR  2009   inguinal  . JOINT REPLACEMENT Bilateral 2017   knees  . RADIOACTIVE SEED IMPLANT N/A 04/11/2018   Procedure: RADIOACTIVE SEED IMPLANT/BRACHYTHERAPY IMPLANT;  Surgeon: [name], MD;  Location: [facility];  Service: Urology;  Laterality: N/A;   71     SEEDS IMPLANTED  . SPACE OAR INSTILLATION N/A 04/11/2018   Procedure: SPACE OAR  INSTILLATION;  Surgeon: [name], MD;  Location[facility];  Service: Urology;  Laterality: N/A;  . TOTAL KNEE ARTHROPLASTY Bilateral 09/13/2015   Procedure: BILATERAL KNEE ARTHROPLASTY ;  Surgeon: Paralee Cancel, MD;  Location: [facility];  Service: Orthopedics;  Laterality: Bilateral;       Home Medications    Prior to Admission medications   Medication Sig Start Date End Date Taking? Authorizing Provider  lisinopril (ZESTRIL) 20 MG tablet TAKE 1 TABLET BY MOUTH EVERY DAY 12/26/18  Yes Chambliss, Jeb Levering, MD  folic acid (FOLVITE) 1 MG tablet Take 1 mg by mouth daily. 08/06/18   [provider]  meloxicam (MOBIC) 15 MG tablet TAKE 1 TABLET BY MOUTH EVERY DAY 10/08/17   Chambliss, Jeb Levering, MD  methotrexate (RHEUMATREX) 2.5 MG tablet 8 TABLETS ONCE A WEEK ORALLY 28 DAYS 08/29/18   [provider]  Multiple Vitamin (MULTIVITAMIN WITH MINERALS) TABS tablet Take 1 tablet by mouth daily.    [provider]  oxyCODONE-acetaminophen (PERCOCET) 10-325 MG tablet TAKE 1 TABLET BY MOUTH EVERY 4 TO 6 HOURS 02/23/18   [provider]  predniSONE (DELTASONE) 10 MG tablet Take 10 mg by mouth daily with breakfast.    [provider]  tamsulosin (FLOMAX) 0.4 MG CAPS capsule Take 1 capsule (0.4 mg total) by mouth daily. 04/11/18   [name], MD  Family History Family History  Problem Relation Age of Onset  . Prostate cancer Father   . Prostate cancer Brother   . Colon cancer Neg Hx   . Rectal cancer Neg Hx   . Stomach cancer Neg Hx     Social History Social History   Tobacco Use  . Smoking status: Never Smoker  . Smokeless tobacco: Never Used  Substance Use Topics  . Alcohol use: No    Alcohol/week: 0.0 standard drinks  . Drug use: Not Currently     Allergies   Chlorthalidone   Review of Systems Review of Systems   Physical Exam Triage Vital Signs ED Triage Vitals [02/17/19 1408]  Enc Vitals Group      BP 119/67     Pulse Rate 74     Resp 19     Temp 98.6 F (37 C)     Temp Source Oral     SpO2 100 %     Weight      Height      Head Circumference      Peak Flow      Pain Score      Pain Loc      Pain Edu?      Excl. in [place]?    No data found.  Updated Vital Signs BP 119/67 (BP Location: Right Arm)   Pulse 74   Temp 98.6 F (37 C) (Oral)   Resp 19   SpO2 100%   Visual Acuity Right Eye Distance:   Left Eye Distance:   Bilateral Distance:    Right Eye Near:   Left Eye Near:    Bilateral Near:     Physical Exam Vitals signs and nursing note reviewed.  Constitutional:      Appearance: Normal appearance.  HENT:     Head: Normocephalic and atraumatic.     Nose: Nose normal.  Eyes:     Conjunctiva/sclera: Conjunctivae normal.  Neck:     Musculoskeletal: Normal range of motion.  Pulmonary:     Effort: Pulmonary effort is normal.  Musculoskeletal: Normal range of motion.  Skin:    General: Skin is warm and dry.     Findings: Laceration present.          Comments: Laceration to center of left palm extending into the left wrist near ulna. Good ROM of the wrist, fingers. Neurovascular intact. Good cap refill. Radial pulse strong.   Neurological:     Mental Status: He is alert.  Psychiatric:        Mood and Affect: Mood normal.              UC Treatments / Results  Labs (all labs ordered are listed, but only abnormal results are displayed) Labs Reviewed - No data to display  EKG   Radiology Dg Hand Complete Left  Result Date: 02/17/2019 CLINICAL DATA:  Left hand wrist pain, trauma, injury EXAM: LEFT HAND - COMPLETE 3+ VIEW COMPARISON:  04/12/2016 left wrist FINDINGS: Mild diffuse degenerative changes most pronounced of the left first and third MCP joints with joint space loss, sclerosis and bony spurring. Minor arthritic changes of the interphalangeal joints diffusely. No acute osseous finding, fracture, malalignment. Peripheral atherosclerosis  noted at the wrist. No definite radiopaque soft tissue foreign body. IMPRESSION: Degenerative changes as above. No acute finding by plain radiography. Electronically Signed   By: Jerilynn Mages.  Shick M.D.   On: 02/17/2019 15:02    Procedures Laceration Repair  Date/Time: 02/18/2019 8:23 AM  Performed by: Orvan July, NP Authorized by: Orvan July, NP   Consent:    Consent obtained:  Verbal   Consent given by:  Patient   Risks discussed:  Infection, need for additional repair, pain, poor cosmetic result and poor wound healing   Alternatives discussed:  No treatment and delayed treatment Universal protocol:    Patient identity confirmed:  Verbally with patient Anesthesia (see MAR for exact dosages):    Anesthesia method:  Local infiltration   Local anesthetic:  Lidocaine 2% w/o epi Laceration details:    Location:  Hand   Hand location:  L palm   Length (cm):  8 Repair type:    Repair type:  Complex Pre-procedure details:    Preparation:  Patient was prepped and draped in usual sterile fashion Exploration:    Limited defect created (wound extended): no     Hemostasis achieved with:  Direct pressure   Wound exploration: wound explored through full range of motion     Contaminated: no   Treatment:    Area cleansed with:  Saline and Shur-Clens   Amount of cleaning:  Extensive   Irrigation solution:  Sterile saline   Irrigation volume:  100   Visualized foreign bodies/material removed: no     Debridement:  None   Undermining:  None   Scar revision: no   Skin repair:    Repair method:  Sutures   Suture size:  5-0   Suture material:  Nylon   Suture technique:  Simple interrupted   Number of sutures:  9 Approximation:    Approximation:  Loose Post-procedure details:    Dressing:  Antibiotic ointment, non-adherent dressing and bulky dressing   Patient tolerance of procedure:  Tolerated well, no immediate complications   (including critical care time)  Medications Ordered in UC  Medications  Tdap (BOOSTRIX) injection 0.5 mL (0.5 mLs Intramuscular Given 02/17/19 1553)  Tdap (BOOSTRIX) 5-2.5-18.5 LF-MCG/0.5 injection (has no administration in time range)    Initial Impression / Assessment and Plan / UC Course  I have reviewed the triage vital signs and the nursing notes.  Pertinent labs & imaging results that were available during my care of the patient were reviewed by me and considered in my medical decision making (see chart for details).       Laceration- closed using 9 simple sutures. Pt tolerated well.  Applied bacitracin and wrapped.  Instructions given on how to care for the wound.  X ray negative. Tetanus updated.  Final Clinical Impressions(s) / UC Diagnoses   Final diagnoses:  Laceration of left hand, foreign body presence unspecified, initial encounter     Discharge Instructions     He the dressing on for 24 hours.  You can take the dressing off tomorrow and apply more antibiotic ointment and redress. Keep clean and dry.   Watch for signs of infection to include increased pain at the site, redness, swelling or drainage. Come back in here in 7 to 10 days for removal of the stitches We updated your tetanus today     ED Prescriptions    None     PDMP not reviewed this encounter.   Orvan July, NP 02/18/19 [phone]

## 2019-02-19 DIAGNOSIS — M25529 Pain in unspecified elbow: Secondary | ICD-10-CM | POA: Diagnosis not present

## 2019-02-19 DIAGNOSIS — Z79899 Other long term (current) drug therapy: Secondary | ICD-10-CM | POA: Diagnosis not present

## 2019-02-19 DIAGNOSIS — M069 Rheumatoid arthritis, unspecified: Secondary | ICD-10-CM | POA: Diagnosis not present

## 2019-02-19 DIAGNOSIS — Z79891 Long term (current) use of opiate analgesic: Secondary | ICD-10-CM | POA: Diagnosis not present

## 2019-02-19 DIAGNOSIS — G894 Chronic pain syndrome: Secondary | ICD-10-CM | POA: Diagnosis not present

## 2019-02-19 DIAGNOSIS — M25559 Pain in unspecified hip: Secondary | ICD-10-CM | POA: Diagnosis not present

## 2019-03-11 ENCOUNTER — Ambulatory Visit (HOSPITAL_COMMUNITY)
Admission: EM | Admit: 2019-03-11 | Discharge: 2019-03-11 | Disposition: A | Discharge status: Home / Self Care | Payer: Medicare Other | Attending: Urgent Care | Admitting: Urgent Care

## 2019-03-11 ENCOUNTER — Encounter (HOSPITAL_COMMUNITY): Payer: Self-pay

## 2019-03-11 DIAGNOSIS — R220 Localized swelling, mass and lump, head: Secondary | ICD-10-CM

## 2019-03-11 DIAGNOSIS — T7840XA Allergy, unspecified, initial encounter: Secondary | ICD-10-CM | POA: Diagnosis not present

## 2019-03-11 DIAGNOSIS — T63481A Toxic effect of venom of other arthropod, accidental (unintentional), initial encounter: Secondary | ICD-10-CM | POA: Diagnosis not present

## 2019-03-11 DIAGNOSIS — L509 Urticaria, unspecified: Secondary | ICD-10-CM

## 2019-03-11 MED ORDER — METHYLPREDNISOLONE SODIUM SUCC 125 MG IJ SOLR
125.0000 mg | Freq: Once | INTRAMUSCULAR | Status: AC
  Administered 2019-03-11: 18:00:00 125 mg via INTRAMUSCULAR

## 2019-03-11 MED ORDER — METHYLPREDNISOLONE SODIUM SUCC 125 MG IJ SOLR
INTRAMUSCULAR | Status: AC
  Filled 2019-03-11: qty 2

## 2019-03-11 MED ORDER — HYDROXYZINE HCL 25 MG PO TABS: 12.5000 mg | ORAL_TABLET | Freq: Three times a day (TID) | ORAL | 0 refills | Status: DC | PRN
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
## 2019-03-11 NOTE — ED Provider Notes (Signed)
[place]   MRN: [id number] DOB: [birth year]  Subjective:   Thomas [name] is a 68 y.o. male presenting for suffering a sting from an insect to his posterior right sided scalp about 3 hours ago.  Patient had subsequently developed swelling of his eyelids, itching.  He also feels like his tongue and lips are swollen.  Has not tried any medications orally for this.  He did put Benadryl cream over the sting.  Of note, patient also reports that he got bit by fire ants about a week ago and has been working through a rash for this as well.  Denies fever, difficulty speaking, difficulty controlling secretions, chest tightness, shortness of breath, wheezing, throat closing sensation, nausea, vomiting.  Denies history of anaphylactic reaction.  No current facility-administered medications for this encounter.   Current Outpatient Medications:  .  folic acid (FOLVITE) 1 MG tablet, Take 1 mg by mouth daily., Disp: , Rfl:  .  lisinopril (ZESTRIL) 20 MG tablet, TAKE 1 TABLET BY MOUTH EVERY DAY, Disp: 90 tablet, Rfl: 0 .  meloxicam (MOBIC) 15 MG tablet, TAKE 1 TABLET BY MOUTH EVERY DAY, Disp: 90 tablet, Rfl: 1 .  methotrexate (RHEUMATREX) 2.5 MG tablet, 8 TABLETS ONCE A WEEK ORALLY 28 DAYS, Disp: , Rfl:  .  Multiple Vitamin (MULTIVITAMIN WITH MINERALS) TABS tablet, Take 1 tablet by mouth daily., Disp: , Rfl:  .  oxyCODONE-acetaminophen (PERCOCET) 10-325 MG tablet, TAKE 1 TABLET BY MOUTH EVERY 4 TO 6 HOURS, Disp: , Rfl: 0 .  predniSONE (DELTASONE) 10 MG tablet, Take 10 mg by mouth daily with breakfast., Disp: , Rfl:  .  tamsulosin (FLOMAX) 0.4 MG CAPS capsule, Take 1 capsule (0.4 mg total) by mouth daily., Disp: 30 capsule, Rfl: 11   Allergies  Allergen Reactions  . Chlorthalidone Other (See Comments)    "Makes me light headed and I don't like the way it makes me feel"    Past Medical History:  Diagnosis Date  . Cancer [facility])    prostate  . Claustrophobia    quite severe  . Hypertension    . Hypoglycemia    occ  . Left knee DJD    Xray 12/23/08  . Prostate cancer [facility])      Past Surgical History:  Procedure Laterality Date  . CYSTOSCOPY  04/11/2018   Procedure: CYSTOSCOPY FLEXIBLE;  Surgeon: [name], MD;  Location: [facility];  Service: Urology;;  NO SEEDS FOUND IN BLADDER  . HERNIA REPAIR  2009   inguinal  . JOINT REPLACEMENT Bilateral 2017   knees  . RADIOACTIVE SEED IMPLANT N/A 04/11/2018   Procedure: RADIOACTIVE SEED IMPLANT/BRACHYTHERAPY IMPLANT;  Surgeon: [name], MD;  Location[facility];  Service: Urology;  Laterality: N/A;   71     SEEDS IMPLANTED  . SPACE OAR INSTILLATION N/A 04/11/2018   Procedure: SPACE OAR INSTILLATION;  Surgeon: [name], MD;  Location[facility];  Service: Urology;  Laterality: N/A;  . TOTAL KNEE ARTHROPLASTY Bilateral 09/13/2015   Procedure: BILATERAL KNEE ARTHROPLASTY ;  Surgeon: Paralee Cancel, MD;  Location: [facility];  Service: Orthopedics;  Laterality: Bilateral;    Family History  Problem Relation Age of Onset  . Prostate cancer Father   . Prostate cancer Brother   . Colon cancer Neg Hx   . Rectal cancer Neg Hx   . Stomach cancer Neg Hx     Social History   Tobacco Use  . Smoking  status: Never Smoker  . Smokeless tobacco: Never Used  Substance Use Topics  . Alcohol use: No    Alcohol/week: 0.0 standard drinks  . Drug use: Not Currently    ROS   Objective:   Vitals: BP (!) 113/59 (BP Location: Right Arm)   Pulse 90   Temp 98 F (36.7 C) (Temporal)   Resp 20   SpO2 97%   Physical Exam Constitutional:      General: He is not in acute distress.    Appearance: Normal appearance. He is well-developed and normal weight. He is not ill-appearing, toxic-appearing or diaphoretic.  HENT:     Head: Normocephalic and atraumatic.      Comments: Airway is patent.    Right Ear: External ear normal.     Left Ear: External ear normal.      Nose: Nose normal.     Mouth/Throat:     Mouth: Mucous membranes are moist.     Pharynx: Oropharynx is clear.  Eyes:     General: No scleral icterus.    Extraocular Movements: Extraocular movements intact.     Pupils: Pupils are equal, round, and reactive to light.   Cardiovascular:     Rate and Rhythm: Normal rate and regular rhythm.     Heart sounds: Normal heart sounds. No murmur. No friction rub. No gallop.   Pulmonary:     Effort: Pulmonary effort is normal. No respiratory distress.     Breath sounds: Normal breath sounds. No stridor. No wheezing, rhonchi or rales.  Skin:    General: Skin is warm and dry.     Findings: Rash (Diffusely scattered patches of urticarial lesions over bilateral extremities and upper chest) present.  Neurological:     Mental Status: He is alert and oriented to person, place, and time.     Cranial Nerves: No cranial nerve deficit.     Motor: No weakness.     Coordination: Coordination normal.     Gait: Gait normal.     Deep Tendon Reflexes: Reflexes normal.  Psychiatric:        Mood and Affect: Mood normal.        Behavior: Behavior normal.        Thought Content: Thought content normal.        Judgment: Judgment normal.     Assessment and Plan :   1. Allergic reaction, initial encounter   2. Urticaria   3. Insect sting allergy, current reaction, accidental or unintentional, initial encounter   4. Facial swelling     Physical exam findings reassuring against anaphylactic reaction.  Will use IM Solu-Medrol, hydroxyzine as an outpatient. Counseled patient on potential for adverse effects with medications prescribed/recommended today, ER and return-to-clinic precautions discussed, patient verbalized understanding.    Jaynee Eagles, [place] 03/11/19 [phone]

## 2019-03-19 DIAGNOSIS — M25551 Pain in right hip: Secondary | ICD-10-CM | POA: Diagnosis not present

## 2019-03-19 DIAGNOSIS — M25529 Pain in unspecified elbow: Secondary | ICD-10-CM | POA: Diagnosis not present

## 2019-03-19 DIAGNOSIS — M069 Rheumatoid arthritis, unspecified: Secondary | ICD-10-CM | POA: Diagnosis not present

## 2019-03-19 DIAGNOSIS — G894 Chronic pain syndrome: Secondary | ICD-10-CM | POA: Diagnosis not present

## 2019-03-25 ENCOUNTER — Other Ambulatory Visit: Payer: Self-pay | Admitting: Pain Medicine

## 2019-03-25 DIAGNOSIS — M25559 Pain in unspecified hip: Secondary | ICD-10-CM

## 2019-03-25 DIAGNOSIS — M8700 Idiopathic aseptic necrosis of unspecified bone: Secondary | ICD-10-CM

## 2019-04-02 ENCOUNTER — Ambulatory Visit: Payer: Medicare Other | Admitting: Podiatry

## 2019-04-02 ENCOUNTER — Other Ambulatory Visit: Payer: Self-pay

## 2019-04-02 ENCOUNTER — Other Ambulatory Visit: Payer: Self-pay | Admitting: Podiatry

## 2019-04-02 ENCOUNTER — Encounter: Payer: Self-pay | Admitting: Podiatry

## 2019-04-02 ENCOUNTER — Ambulatory Visit (INDEPENDENT_AMBULATORY_CARE_PROVIDER_SITE_OTHER): Payer: Medicare Other

## 2019-04-02 DIAGNOSIS — M79672 Pain in left foot: Secondary | ICD-10-CM

## 2019-04-02 DIAGNOSIS — L97522 Non-pressure chronic ulcer of other part of left foot with fat layer exposed: Secondary | ICD-10-CM

## 2019-04-02 MED ORDER — DOXYCYCLINE HYCLATE 100 MG PO TABS: 100.0000 mg | ORAL_TABLET | Freq: Two times a day (BID) | ORAL | 0 refills | Status: DC

## 2019-04-02 NOTE — Progress Notes (Signed)
Subjective:   Patient ID: Thomas [name], male   DOB: 68 y.o.   MRN: [id number]   HPI Patient presents stating he is developed some breakdown of tissue in the plantar aspect of the left foot and does not remember specific injury to it and admits it is been there for several months but he needs to work and has been on his foot   ROS      Objective:  Physical Exam  Neurovascular status unchanged with patient found to have approximate 7 mm x 7 mm opening plantar aspect left foot around the second and third metatarsal head that does show low-grade subcutaneous tissue but no probing to tendon or bone.  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  I placed him on doxycycline gave him strict instructions to call us if any issues were to occur  X-rays indicate that there is no signs of osteolysis lytic changes currently

## 2019-04-06 ENCOUNTER — Ambulatory Visit
Admission: RE | Admit: 2019-04-06 | Discharge: 2019-04-06 | Disposition: A | Discharge status: Home / Self Care | Payer: Medicare Other | Source: Ambulatory Visit | Attending: Pain Medicine | Admitting: Pain Medicine

## 2019-04-06 ENCOUNTER — Other Ambulatory Visit: Payer: Self-pay | Admitting: Diagnostic Radiology

## 2019-04-06 ENCOUNTER — Other Ambulatory Visit: Payer: Self-pay

## 2019-04-06 ENCOUNTER — Ambulatory Visit
Admission: RE | Admit: 2019-04-06 | Discharge: 2019-04-06 | Disposition: A | Discharge status: Home / Self Care | Payer: Medicare Other | Source: Ambulatory Visit | Attending: Diagnostic Radiology | Admitting: Diagnostic Radiology

## 2019-04-06 DIAGNOSIS — S0550XA Penetrating wound with foreign body of unspecified eyeball, initial encounter: Secondary | ICD-10-CM

## 2019-04-06 DIAGNOSIS — M25559 Pain in unspecified hip: Secondary | ICD-10-CM

## 2019-04-06 DIAGNOSIS — M25551 Pain in right hip: Secondary | ICD-10-CM | POA: Diagnosis not present

## 2019-04-06 DIAGNOSIS — Z135 Encounter for screening for eye and ear disorders: Secondary | ICD-10-CM | POA: Diagnosis not present

## 2019-04-06 DIAGNOSIS — M8700 Idiopathic aseptic necrosis of unspecified bone: Secondary | ICD-10-CM
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
## 2019-04-06 IMAGING — CR DG ORBITS FOR FOREIGN BODY
2 series · 2 of 2 positions shown · non-contrast
Comparison: None.

CLINICAL DATA: Metal working/exposure; clearance prior to MRI.
68-year-old male

EXAM:
ORBITS FOR FOREIGN BODY - 2 VIEW

[w orbit pa (1 of 2)]
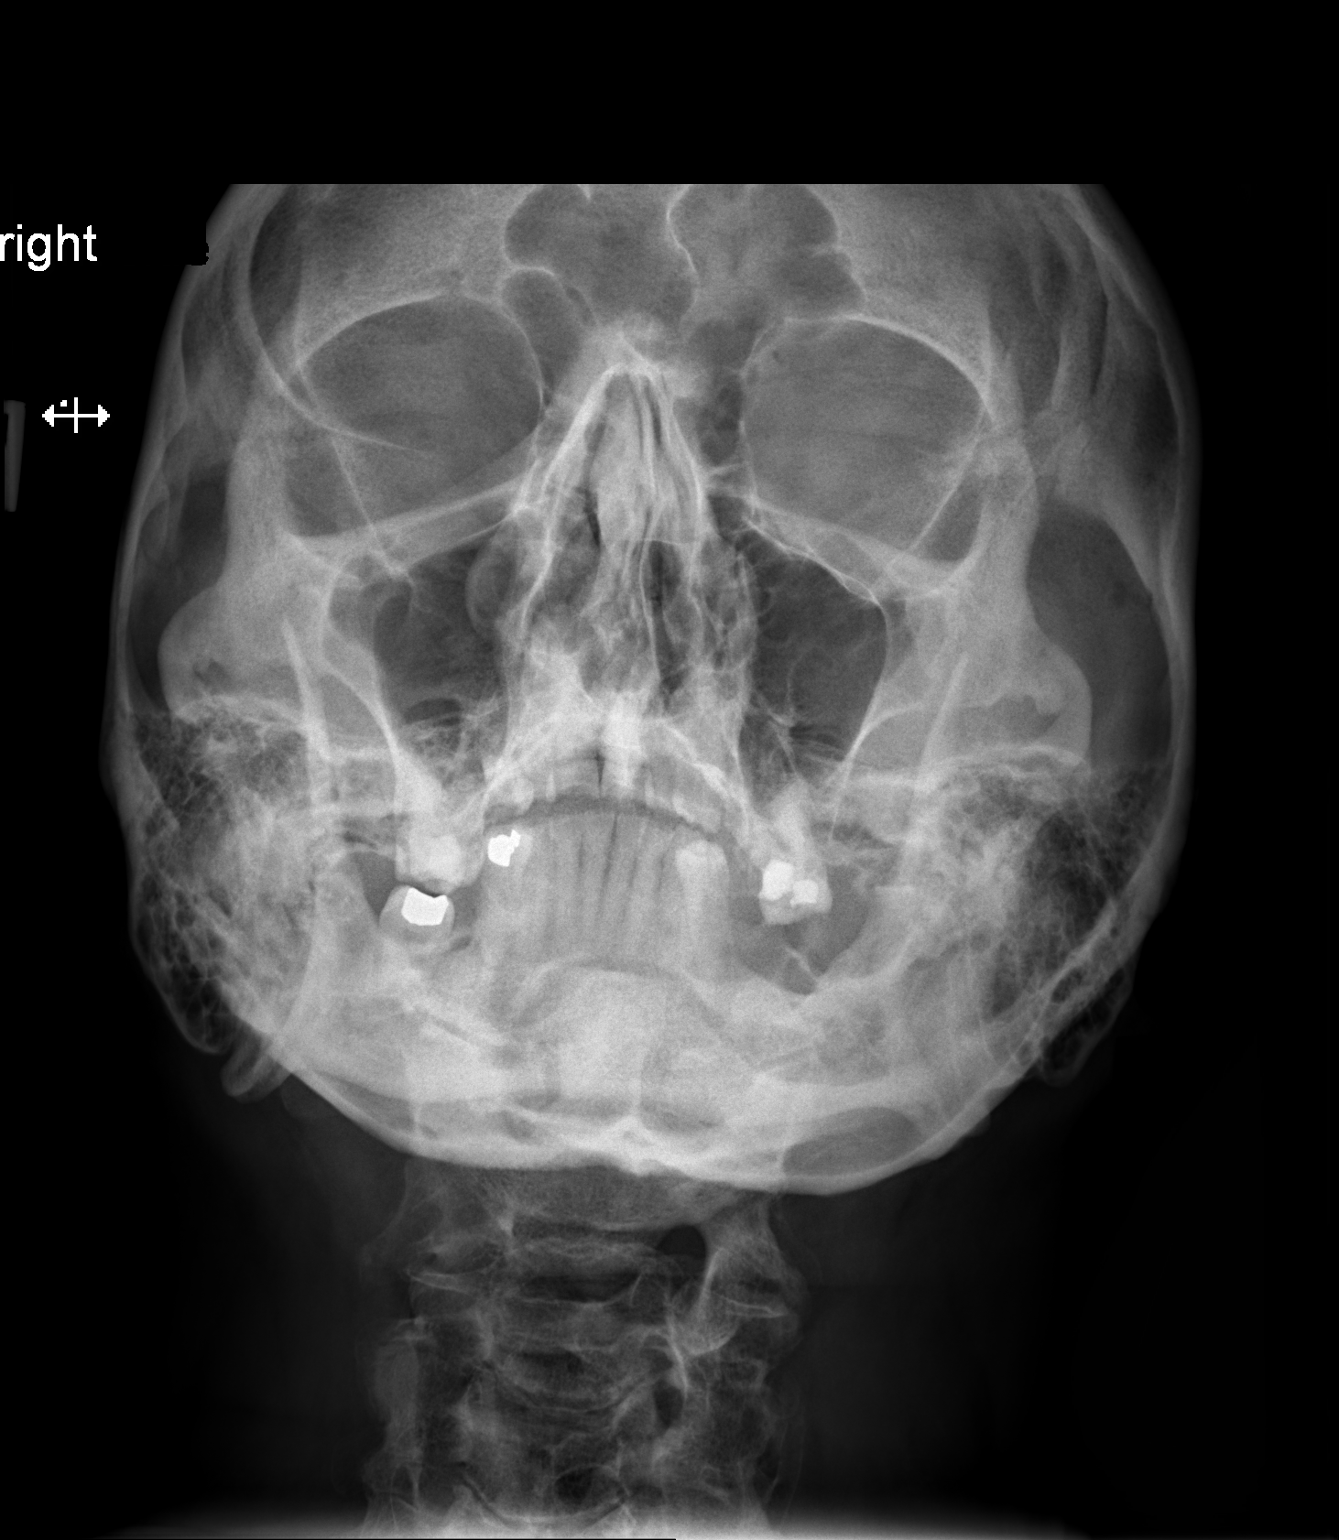

[w orbit pa (2 of 2)]
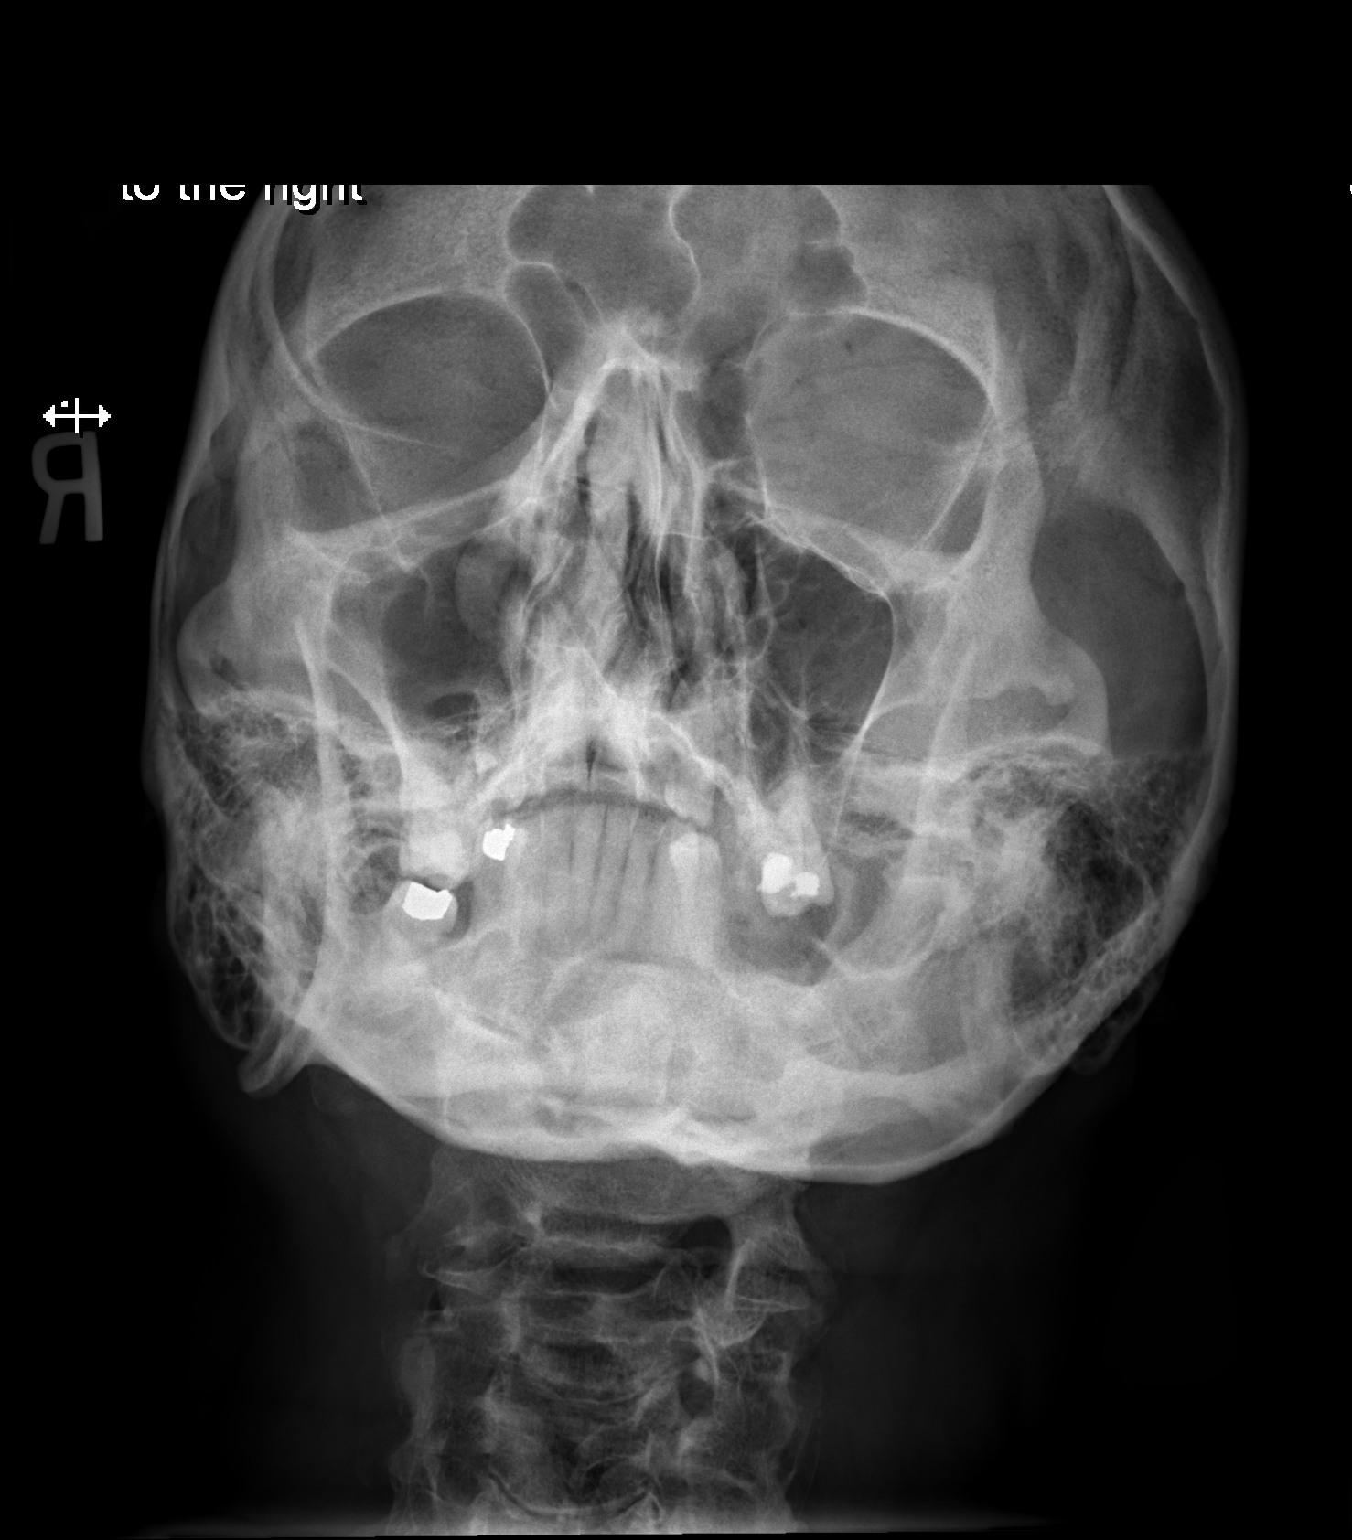

[2 of 2 positions shown; findings below may reference images not displayed]

FINDINGS: There is no evidence of metallic foreign body within the orbits. No
significant bone abnormality identified.
IMPRESSION: No evidence of metallic foreign body within the orbits.

## 2019-04-16 ENCOUNTER — Ambulatory Visit: Payer: Medicare Other | Admitting: Podiatry

## 2019-04-16 ENCOUNTER — Other Ambulatory Visit: Payer: Self-pay

## 2019-04-16 ENCOUNTER — Encounter: Payer: Self-pay | Admitting: Podiatry

## 2019-04-16 DIAGNOSIS — L97522 Non-pressure chronic ulcer of other part of left foot with fat layer exposed: Secondary | ICD-10-CM | POA: Diagnosis not present

## 2019-04-16 NOTE — Progress Notes (Signed)
Subjective:   Patient ID: Thomas [name], male   DOB: 68 y.o.   MRN: [id number]   HPI Patient states it seems to be getting better and I am just completing antibiotics now have not noted drainage recently   ROS      Objective:  Physical Exam  Neurovascular status intact with patient having chronic ulceration left plantar foot with history of neuropathy and extended weightbearing.  It measures approximately 1 cm x 1 cm with no current subcutaneous exposure     Assessment:  Ulceration left plantar second metatarsal that is improving still present     Plan:  Sharp sterile debridement of the area was done with flush accomplished with no drainage no odor noted currently.  I did apply padding Iodosorb sterile dressing advised on continued on home usage of this and I gave strict instructions that if any redness drainage or pain were to occur to be seen back immediately.  I do think at this point he should heal uneventfully but will take several more weeks and I encouraged him to let us know if any issues occur

## 2019-04-21 DIAGNOSIS — M0579 Rheumatoid arthritis with rheumatoid factor of multiple sites without organ or systems involvement: Secondary | ICD-10-CM | POA: Diagnosis not present

## 2019-04-22 ENCOUNTER — Telehealth: Payer: Self-pay

## 2019-04-22 DIAGNOSIS — Z8546 Personal history of malignant neoplasm of prostate: Secondary | ICD-10-CM | POA: Diagnosis not present

## 2019-04-22 DIAGNOSIS — E291 Testicular hypofunction: Secondary | ICD-10-CM | POA: Diagnosis not present

## 2019-04-22 DIAGNOSIS — R300 Dysuria: Secondary | ICD-10-CM | POA: Diagnosis not present

## 2019-04-22 DIAGNOSIS — N5201 Erectile dysfunction due to arterial insufficiency: Secondary | ICD-10-CM | POA: Diagnosis not present

## 2019-04-22 NOTE — Telephone Encounter (Signed)
Patient called to ask about his upcoming appointment on 04/23/19. His complaint is that when he saw Dr Paulla Dolly before Christmas, he was told that his foot ulcer was healing well and was told to come back PRN. Now his ulcer is worse and he wants to know if he will be charged for the office visit tomorrow. Please call to advise Thanks  Nira Conn

## 2019-04-23 DIAGNOSIS — M069 Rheumatoid arthritis, unspecified: Secondary | ICD-10-CM | POA: Diagnosis not present

## 2019-04-23 DIAGNOSIS — S73199A Other sprain of unspecified hip, initial encounter: Secondary | ICD-10-CM | POA: Diagnosis not present

## 2019-04-23 DIAGNOSIS — G894 Chronic pain syndrome: Secondary | ICD-10-CM | POA: Diagnosis not present

## 2019-04-23 DIAGNOSIS — M25529 Pain in unspecified elbow: Secondary | ICD-10-CM | POA: Diagnosis not present

## 2019-04-24 ENCOUNTER — Other Ambulatory Visit: Payer: Self-pay

## 2019-04-24 ENCOUNTER — Ambulatory Visit: Payer: Medicare Other | Admitting: Podiatry

## 2019-04-24 ENCOUNTER — Ambulatory Visit: Payer: Medicare Other

## 2019-04-24 DIAGNOSIS — L97522 Non-pressure chronic ulcer of other part of left foot with fat layer exposed: Secondary | ICD-10-CM | POA: Diagnosis not present

## 2019-04-24 DIAGNOSIS — M79672 Pain in left foot: Secondary | ICD-10-CM

## 2019-04-24 MED ORDER — DOXYCYCLINE HYCLATE 100 MG PO TABS: 100.0000 mg | ORAL_TABLET | Freq: Two times a day (BID) | ORAL | 0 refills | Status: DC

## 2019-04-25 DIAGNOSIS — U071 COVID-19: Secondary | ICD-10-CM

## 2019-04-25 HISTORY — DX: COVID-19: U07.1

## 2019-04-29 ENCOUNTER — Other Ambulatory Visit: Payer: Self-pay | Admitting: Family Medicine

## 2019-05-02 ENCOUNTER — Encounter: Payer: Self-pay | Admitting: Orthopaedic Surgery

## 2019-05-02 ENCOUNTER — Other Ambulatory Visit: Payer: Self-pay

## 2019-05-02 ENCOUNTER — Ambulatory Visit: Payer: Medicare Other | Admitting: Orthopaedic Surgery

## 2019-05-02 ENCOUNTER — Ambulatory Visit: Payer: Self-pay

## 2019-05-02 DIAGNOSIS — M1611 Unilateral primary osteoarthritis, right hip: Secondary | ICD-10-CM | POA: Diagnosis not present

## 2019-05-02 DIAGNOSIS — M24151 Other articular cartilage disorders, right hip: Secondary | ICD-10-CM | POA: Diagnosis not present

## 2019-05-02 NOTE — Addendum Note (Signed)
Addended by: Hortencia Pilar on: 05/02/2019 10:00 AM   Modules accepted: Orders

## 2019-05-02 NOTE — Progress Notes (Signed)
Office Visit Note   Patient: Thomas [name]           Date of Birth: May 22, 1950           MRN: [id number] Visit Date: 05/02/2019              Requested by: [name], MD [name],  [place] [id number] PCP: [name], MD   Assessment & Plan: Visit Diagnoses:  1. Degenerative tear of acetabular labrum of right hip   2. Primary osteoarthritis of right hip     Plan: The MRI of the right hip shows a degenerative tear of the anterior and superior labrum.  X-rays demonstrate a cam deformity of the femoral head with mild to moderate arthritis.  My impression is that the labrum is more symptomatic based on presentation.  Based on our discussion we will move forward with a cortisone injection today.  I think if we can get the labrum to calm down this would give him significant pain relief and improvement in function and activity.  Questions encouraged and answered.  Follow-up as needed.  Follow-Up Instructions: Return if symptoms worsen or fail to improve.   Orders:  No orders of the defined types were placed in this encounter.  No orders of the defined types were placed in this encounter.     Procedures: No procedures performed   Clinical Data: No additional findings.   Subjective: Chief Complaint  Patient presents with  . Right Hip - Pain    Mr. Thomas [name] is a very pleasant 69 year old gentleman comes in for evaluation of chronic right hip pain.  He has had hip x-rays right hip MRI done recently.  He goes to preferred pain management for chronic back pain.  He has not had any previous injections.  He feels pain in the right hip and the superior lateral region.  Denies any constant groin pain.  Denies any radicular pain.  He works as a Water quality scientist.   Review of Systems  Constitutional: Negative.   All other systems reviewed and are negative.    Objective: Vital Signs: There were no vitals taken for this visit.  Physical Exam  Vitals and nursing note reviewed.  Constitutional:      Appearance: He is well-developed.  HENT:     Head: Normocephalic and atraumatic.  Eyes:     Pupils: Pupils are equal, round, and reactive to light.  Pulmonary:     Effort: Pulmonary effort is normal.  Abdominal:     Palpations: Abdomen is soft.  Musculoskeletal:        General: Normal range of motion.     Cervical back: Neck supple.  Skin:    General: Skin is warm.  Neurological:     Mental Status: He is alert and oriented to person, place, and time.  Psychiatric:        Behavior: Behavior normal.        Thought Content: Thought content normal.        Judgment: Judgment normal.     Ortho Exam Right hip exam shows pain with deep hip flexion with external and internal rotation.  Positive Stinchfield sign.  No significant pain with gentle internal rotation external rotation.  Negative logroll.  No sciatic tension signs.  No bony tenderness. Specialty Comments:  No specialty comments available.  Imaging: No results found.   PMFS History: Patient Active Problem List   Diagnosis Date Noted  . Degenerative tear of acetabular labrum  of right hip 05/02/2019  . Primary osteoarthritis of right hip 05/02/2019  . Skin ulcer ([place]) 01/29/2019  . Malignant neoplasm of prostate ([place]) 01/16/2018  . Insomnia 10/02/2017  . Cyclic citrullinated peptide (CCP) antibody positive 05/31/2016  . S/P bilateral TKA 09/13/2015  . Other bilateral secondary osteoarthritis of knee 07/14/2014  . Heme positive stool 05/21/2013  . Osteoarthritis, multiple sites 01/13/2011  . Back pain 01/13/2011  . Hypertension 12/06/2010  . ED (erectile dysfunction) 08/25/2010  . CLAUSTROPHOBIA 03/01/2010   Past Medical History:  Diagnosis Date  . Cancer [facility])    prostate  . Claustrophobia    quite severe  . Hypertension   . Hypoglycemia    occ  . Left knee DJD    Xray 12/23/08  . Prostate cancer [facility])     Family History  Problem Relation Age of  Onset  . Prostate cancer Father   . Prostate cancer Brother   . Colon cancer Neg Hx   . Rectal cancer Neg Hx   . Stomach cancer Neg Hx     Past Surgical History:  Procedure Laterality Date  . CYSTOSCOPY  04/11/2018   Procedure: CYSTOSCOPY FLEXIBLE;  Surgeon: [name], MD;  Location: [facility];  Service: Urology;;  NO SEEDS FOUND IN BLADDER  . HERNIA REPAIR  2009   inguinal  . JOINT REPLACEMENT Bilateral 2017   knees  . RADIOACTIVE SEED IMPLANT N/A 04/11/2018   Procedure: RADIOACTIVE SEED IMPLANT/BRACHYTHERAPY IMPLANT;  Surgeon: [name], MD;  Location: [facility];  Service: Urology;  Laterality: N/A;   71     SEEDS IMPLANTED  . SPACE OAR INSTILLATION N/A 04/11/2018   Procedure: SPACE OAR INSTILLATION;  Surgeon: [name], MD;  Location: [facility];  Service: Urology;  Laterality: N/A;  . TOTAL KNEE ARTHROPLASTY Bilateral 09/13/2015   Procedure: BILATERAL KNEE ARTHROPLASTY ;  Surgeon: Paralee Cancel, MD;  Location: [facility];  Service: Orthopedics;  Laterality: Bilateral;   Social History   Occupational History  . Not on file  Tobacco Use  . Smoking status: Never Smoker  . Smokeless tobacco: Never Used  Substance and Sexual Activity  . Alcohol use: No    Alcohol/week: 0.0 standard drinks  . Drug use: Not Currently  . Sexual activity: Yes

## 2019-05-02 NOTE — Progress Notes (Signed)
Subjective: Patient is here for ultrasound-guided intra-articular right hip injection.   Pain is mostly on the lateral aspect, but the pain is constant and keeps him awake at night.  MRI arthrogram showed DJD as well as a labrum tear.  He also states that he has had a lumbar disc problems since his 68s which has been manageable with careful lifting.  Objective: He has decreased range of motion with internal rotation, and it causes pain.  He also has tenderness over the greater trochanter.  Procedure: Ultrasound-guided right hip injection: After sterile prep with Betadine, injected 8 cc 1% lidocaine without epinephrine and 40 mg methylprednisolone using a 22-gauge spinal needle, passing the needle through the iliofemoral ligament into the femoral head/neck junction.  Injectate seen filling the joint capsule.  He did not have too much immediate relief.  If this does not help, could contemplate greater trochanter injection.  Lumbar work-up would be another consideration.

## 2019-05-16 ENCOUNTER — Other Ambulatory Visit: Payer: Self-pay | Admitting: Podiatry

## 2019-05-16 ENCOUNTER — Telehealth: Payer: Self-pay | Admitting: *Deleted

## 2019-05-16 NOTE — Telephone Encounter (Signed)
Pt called and states he wanted to straighten out a pharmacy call.

## 2019-05-16 NOTE — Telephone Encounter (Signed)
Left message informing pt I was returning his call and to call again.

## 2019-05-20 DIAGNOSIS — M150 Primary generalized (osteo)arthritis: Secondary | ICD-10-CM | POA: Diagnosis not present

## 2019-05-20 DIAGNOSIS — C61 Malignant neoplasm of prostate: Secondary | ICD-10-CM | POA: Diagnosis not present

## 2019-05-20 DIAGNOSIS — M79604 Pain in right leg: Secondary | ICD-10-CM | POA: Diagnosis not present

## 2019-05-20 DIAGNOSIS — M0579 Rheumatoid arthritis with rheumatoid factor of multiple sites without organ or systems involvement: Secondary | ICD-10-CM | POA: Diagnosis not present

## 2019-05-21 DIAGNOSIS — M25529 Pain in unspecified elbow: Secondary | ICD-10-CM | POA: Diagnosis not present

## 2019-05-21 DIAGNOSIS — Z79891 Long term (current) use of opiate analgesic: Secondary | ICD-10-CM | POA: Diagnosis not present

## 2019-05-21 DIAGNOSIS — G894 Chronic pain syndrome: Secondary | ICD-10-CM | POA: Diagnosis not present

## 2019-05-21 DIAGNOSIS — M069 Rheumatoid arthritis, unspecified: Secondary | ICD-10-CM | POA: Diagnosis not present

## 2019-05-21 DIAGNOSIS — Z79899 Other long term (current) drug therapy: Secondary | ICD-10-CM | POA: Diagnosis not present

## 2019-05-21 DIAGNOSIS — S73199A Other sprain of unspecified hip, initial encounter: Secondary | ICD-10-CM | POA: Diagnosis not present

## 2019-05-22 ENCOUNTER — Ambulatory Visit: Payer: Medicare Other | Admitting: Podiatry

## 2019-05-23 ENCOUNTER — Encounter: Payer: Self-pay | Admitting: Podiatry

## 2019-05-23 ENCOUNTER — Ambulatory Visit: Payer: Medicare Other | Admitting: Podiatry

## 2019-05-23 ENCOUNTER — Other Ambulatory Visit: Payer: Self-pay | Admitting: Podiatry

## 2019-05-23 ENCOUNTER — Other Ambulatory Visit: Payer: Self-pay

## 2019-05-23 ENCOUNTER — Ambulatory Visit (INDEPENDENT_AMBULATORY_CARE_PROVIDER_SITE_OTHER): Payer: Medicare Other

## 2019-05-23 VITALS — Temp 97.9°F

## 2019-05-23 DIAGNOSIS — L97522 Non-pressure chronic ulcer of other part of left foot with fat layer exposed: Secondary | ICD-10-CM

## 2019-05-23 NOTE — Progress Notes (Signed)
Subjective:   Patient ID: Thomas [name], male   DOB: 69 y.o.   MRN: [id number]   HPI Patient presents stating I am still having a lot of problems with this area underneath my left foot and states that it does not seem to be healing despite padding and trying to not be on it as much but he has to work   ROS      Objective:  Physical Exam  Neurovascular status intact with patient found to have a 2.5 cm x 2.5 cm ulceration plantar aspect left with approximate 0.5 mm of depth.  There is light subcutaneous exposure there is no proximal edema erythema drainage and it is nonhealing at this time it is is remained at this size for the last 2 months     Assessment:  Chronic ulceration plantar aspect left and patient who does have neuropathy and has to work on his foot     Plan:  H&P reviewed condition and reviewed with Dr. Posey Pronto.  We are both in agreement is not responded to wound care products immobilization offloading and its been recommended that grafting be undertaken in order to try to cure this problem.  Patient wants to undergo this and we will plan on starting this next few weeks and he will continue home care for this including soaks Iodosorb usage and bandage usage  X-rays indicate no signs of osteomyelitic changes associated with condition

## 2019-06-18 ENCOUNTER — Telehealth: Payer: Self-pay

## 2019-06-18 DIAGNOSIS — M069 Rheumatoid arthritis, unspecified: Secondary | ICD-10-CM | POA: Diagnosis not present

## 2019-06-18 DIAGNOSIS — S73199A Other sprain of unspecified hip, initial encounter: Secondary | ICD-10-CM | POA: Diagnosis not present

## 2019-06-18 DIAGNOSIS — G894 Chronic pain syndrome: Secondary | ICD-10-CM | POA: Diagnosis not present

## 2019-06-18 DIAGNOSIS — M25529 Pain in unspecified elbow: Secondary | ICD-10-CM | POA: Diagnosis not present

## 2019-06-18 NOTE — Telephone Encounter (Signed)
Patient Thomas [name] on nurse line for two reasons. (1) he would like to be referred to Dr. Audelia Hives for his foot wound. Patient stated he is not happy with Dr. Paulla Dolly. (2) patient stated his blood pressure has been running high for the last several weeks. Patient stated his "bottom" number has been running over 100 according to his home BP machine. Patient is requesting an adjustment in BP regime and mentioned started amlodipine.    I called patient back to discuss and more than likely set up an apt for BP. Patient stated, "I just sat down for lunch, can you call me back later."   Will forward to PCP in the meantime for suggestions.

## 2019-06-19 ENCOUNTER — Ambulatory Visit (INDEPENDENT_AMBULATORY_CARE_PROVIDER_SITE_OTHER): Payer: Medicare Other | Admitting: Orthopaedic Surgery

## 2019-06-19 ENCOUNTER — Other Ambulatory Visit: Payer: Self-pay

## 2019-06-19 ENCOUNTER — Encounter: Payer: Self-pay | Admitting: Orthopaedic Surgery

## 2019-06-19 ENCOUNTER — Ambulatory Visit: Payer: Medicare Other | Admitting: Podiatry

## 2019-06-19 DIAGNOSIS — G8929 Other chronic pain: Secondary | ICD-10-CM | POA: Insufficient documentation

## 2019-06-19 DIAGNOSIS — M25551 Pain in right hip: Secondary | ICD-10-CM

## 2019-06-19 MED ORDER — BUPIVACAINE HCL 0.5 % IJ SOLN
3.0000 mL | INTRAMUSCULAR | Status: AC | PRN
  Administered 2019-06-19: 3 mL via INTRA_ARTICULAR

## 2019-06-19 MED ORDER — METHYLPREDNISOLONE ACETATE 40 MG/ML IJ SUSP
40.0000 mg | INTRAMUSCULAR | Status: AC | PRN
  Administered 2019-06-19: 40 mg via INTRA_ARTICULAR

## 2019-06-19 MED ORDER — LIDOCAINE HCL 1 % IJ SOLN
3.0000 mL | INTRAMUSCULAR | Status: AC | PRN
  Administered 2019-06-19: 3 mL

## 2019-06-19 NOTE — Progress Notes (Addendum)
Office Visit Note   Patient: Thomas [name]           Date of Birth: October 30, 1950           MRN: [id number] Visit Date: 06/19/2019              Requested by: [name], MD [place],  [place] [address] PCP: [name], MD   Assessment & Plan: Visit Diagnoses:  1. Chronic right hip pain     Plan: My impression is chronic right hip pain.  We will try injection into the trochanteric bursa today to see if this will work better than the intra-articular injection.  The MRI was negative for abductor tendinosis or bursitis.  The MRI findings were all related to degenerative labrum and DJD and para labral cyst.  Patient should follow-up if he does not notice any improvement from this injection.  He is currently under treatment with Dr. Paulla Dolly for a foot wound.  He would like to see Dr. Sharol Given as a second opinion.  Referral made today.  Follow-Up Instructions: Return if symptoms worsen or fail to improve.   Orders:  No orders of the defined types were placed in this encounter.  No orders of the defined types were placed in this encounter.     Procedures: Large Joint Inj: R greater trochanter on 06/19/2019 9:02 AM Indications: pain Details: 22 G needle  Arthrogram: No  Medications: 3 mL lidocaine 1 %; 3 mL bupivacaine 0.5 %; 40 mg methylPREDNISolone acetate 40 MG/ML Patient was prepped and draped in the usual sterile fashion.       Clinical Data: No additional findings.   Subjective: Chief Complaint  Patient presents with  . Right Hip - Pain    Mr. Dilks returns today for right hip pain.  He states that he did have relief for about 4 to 5 weeks after the intra-articular injection.  His pain has now returned and is still lateral over the greater trochanter.  Is tender to palpation.   Review of Systems  Constitutional: Negative.   All other systems reviewed and are negative.    Objective: Vital Signs: There were no vitals taken  for this visit.  Physical Exam Vitals and nursing note reviewed.  Constitutional:      Appearance: He is well-developed.  Pulmonary:     Effort: Pulmonary effort is normal.  Abdominal:     Palpations: Abdomen is soft.  Skin:    General: Skin is warm.  Neurological:     Mental Status: He is alert and oriented to person, place, and time.  Psychiatric:        Behavior: Behavior normal.        Thought Content: Thought content normal.        Judgment: Judgment normal.     Ortho Exam Right hip exam shows negative logroll, negative Stinchfield, no pain with internal and external rotation.  He is quite tender over the lateral aspect of the hip near the greater trochanter. Specialty Comments:  No specialty comments available.  Imaging: No results found.   PMFS History: Patient Active Problem List   Diagnosis Date Noted  . Chronic right hip pain 06/19/2019  . Degenerative tear of acetabular labrum of right hip 05/02/2019  . Primary osteoarthritis of right hip 05/02/2019  . Skin ulcer ([place]) 01/29/2019  . Malignant neoplasm of prostate ([place]) 01/16/2018  . Insomnia 10/02/2017  . Cyclic citrullinated peptide (CCP) antibody positive 05/31/2016  .  S/P bilateral TKA 09/13/2015  . Other bilateral secondary osteoarthritis of knee 07/14/2014  . Heme positive stool 05/21/2013  . Osteoarthritis, multiple sites 01/13/2011  . Back pain 01/13/2011  . Hypertension 12/06/2010  . ED (erectile dysfunction) 08/25/2010  . CLAUSTROPHOBIA 03/01/2010   Past Medical History:  Diagnosis Date  . Cancer [facility])    prostate  . Claustrophobia    quite severe  . Hypertension   . Hypoglycemia    occ  . Left knee DJD    Xray 12/23/08  . Prostate cancer [facility])     Family History  Problem Relation Age of Onset  . Prostate cancer Father   . Prostate cancer Brother   . Colon cancer Neg Hx   . Rectal cancer Neg Hx   . Stomach cancer Neg Hx     Past Surgical History:  Procedure Laterality Date  .  CYSTOSCOPY  04/11/2018   Procedure: CYSTOSCOPY FLEXIBLE;  Surgeon: [name], MD;  Location: [facility];  Service: Urology;;  NO SEEDS FOUND IN BLADDER  . HERNIA REPAIR  2009   inguinal  . JOINT REPLACEMENT Bilateral 2017   knees  . RADIOACTIVE SEED IMPLANT N/A 04/11/2018   Procedure: RADIOACTIVE SEED IMPLANT/BRACHYTHERAPY IMPLANT;  Surgeon: [name], MD;  Location: [facility];  Service: Urology;  Laterality: N/A;   71     SEEDS IMPLANTED  . SPACE OAR INSTILLATION N/A 04/11/2018   Procedure: SPACE OAR INSTILLATION;  Surgeon: [name], MD;  Location: [facility];  Service: Urology;  Laterality: N/A;  . TOTAL KNEE ARTHROPLASTY Bilateral 09/13/2015   Procedure: BILATERAL KNEE ARTHROPLASTY ;  Surgeon: Paralee Cancel, MD;  [facility];  Service: Orthopedics;  Laterality: Bilateral;   Social History   Occupational History  . Not on file  Tobacco Use  . Smoking status: Never Smoker  . Smokeless tobacco: Never Used  Substance and Sexual Activity  . Alcohol use: No    Alcohol/week: 0.0 standard drinks  . Drug use: Not Currently  . Sexual activity: Yes

## 2019-06-20 ENCOUNTER — Ambulatory Visit: Payer: Medicare Other | Admitting: Podiatry

## 2019-06-23 ENCOUNTER — Other Ambulatory Visit: Payer: Self-pay

## 2019-06-23 ENCOUNTER — Ambulatory Visit: Payer: Medicare Other | Admitting: Orthopedic Surgery

## 2019-06-23 ENCOUNTER — Encounter: Payer: Self-pay | Admitting: Orthopedic Surgery

## 2019-06-23 VITALS — Ht 79.0 in | Wt 238.0 lb

## 2019-06-23 DIAGNOSIS — M86272 Subacute osteomyelitis, left ankle and foot: Secondary | ICD-10-CM | POA: Diagnosis not present

## 2019-06-23 DIAGNOSIS — M6702 Short Achilles tendon (acquired), left ankle: Secondary | ICD-10-CM | POA: Diagnosis not present

## 2019-06-23 MED ORDER — DOXYCYCLINE HYCLATE 100 MG PO TABS: 100.0000 mg | ORAL_TABLET | Freq: Two times a day (BID) | ORAL | 0 refills | Status: DC

## 2019-06-26 ENCOUNTER — Encounter: Payer: Self-pay | Admitting: Orthopedic Surgery

## 2019-06-26 NOTE — Progress Notes (Signed)
Office Visit Note   Patient: Thomas [name]           Date of Birth: [birth year]           MRN: [id number] Visit Date: 06/23/2019              Requested by: [name], MD [address] [place],  [place] [address] PCP: [name], MD  Chief Complaint  Patient presents with  . Left Foot - Open Wound      HPI: Patient is a 69 year old gentleman who was seen in referral from Dr. Erlinda Hong for evaluation of left foot.  Patient has been treated by Dr. Paulla Dolly in the past.  He has had a prescription for doxycycline.  Patient complains of a chronic ulcer beneath the second metatarsal head that he states is painful he denies a history of diabetes.  He states that he has been on methotrexate and stopped this last year for his rheumatoid arthritis currently on prednisone.  Assessment & Plan: Visit Diagnoses:  1. Achilles tendon contracture, left   2. Subacute osteomyelitis of left foot (HCC)     Plan: A prescription was called in for doxycycline we will obtain an MRI scan of the left forefoot to evaluate for osteomyelitis of the second metatarsal head.  Follow-up after the MRI is obtained.  Follow-Up Instructions: Return in about 4 weeks (around 07/21/2019).   Ortho Exam  Patient is alert, oriented, no adenopathy, well-dressed, normal affect, normal respiratory effort. Examination patient's radiographs were reviewed which shows a long second metatarsal.  The ulcer probes to the joint capsule but does not probe into the joint.  Patient's radiographs were reviewed which showed severe peripheral vascular disease with calcification of the arteries to the metatarsal heads.  Patient does not have a palpable pulse the Doppler was used and he has a biphasic dorsalis pedis pulse and a triphasic posterior tibial pulse.  Imaging: No results found. No images are attached to the encounter.  Labs: Lab Results  Component Value Date   HGBA1C 5.3 10/28/2015   HGBA1C 5.6  08/10/2014   ESRSEDRATE 4 04/12/2016   CRP 2.0 04/12/2016     Lab Results  Component Value Date   ALBUMIN 3.7 09/29/2018   ALBUMIN 4.1 04/04/2018   ALBUMIN 4.1 09/27/2015    No results found for: MG No results found for: VD25OH  No results found for: PREALBUMIN CBC EXTENDED Latest Ref Rng & Units 09/29/2018 04/04/2018 04/05/2016  WBC 4.0 - 10.5 K/uL 8.1 10.3 4.9  RBC 4.22 - 5.81 MIL/uL 4.63 4.90 5.60  HGB 13.0 - 17.0 g/dL 13.4 14.2 15.9  HCT 39.0 - 52.0 % 42.3 43.7 47.7  PLT 150 - 400 K/uL 228 253 259  NEUTROABS 1.7 - 7.7 K/uL 7.0 - -  LYMPHSABS 0.7 - 4.0 K/uL 0.5(L) - -     Body mass index is 26.81 kg/m.  Orders:  Orders Placed This Encounter  Procedures  . MR Foot Left w/o contrast   Meds ordered this encounter  Medications  . doxycycline (VIBRA-TABS) 100 MG tablet    Sig: Take 1 tablet (100 mg total) by mouth 2 (two) times daily.    Dispense:  60 tablet    Refill:  0     Procedures: No procedures performed  Clinical Data: No additional findings.  ROS:  All other systems negative, except as noted in the HPI. Review of Systems  Objective: Vital Signs: Ht 6\' 7"  (2.007 m)   Wt  238 lb (108 kg)   BMI 26.81 kg/m   Specialty Comments:  No specialty comments available.  PMFS History: Patient Active Problem List   Diagnosis Date Noted  . Chronic right hip pain 06/19/2019  . Degenerative tear of acetabular labrum of right hip 05/02/2019  . Primary osteoarthritis of right hip 05/02/2019  . Skin ulcer ([place]) 01/29/2019  . Malignant neoplasm of prostate ([place]) 01/16/2018  . Insomnia 10/02/2017  . Cyclic citrullinated peptide (CCP) antibody positive 05/31/2016  . S/P bilateral TKA 09/13/2015  . Other bilateral secondary osteoarthritis of knee 07/14/2014  . Heme positive stool 05/21/2013  . Osteoarthritis, multiple sites 01/13/2011  . Back pain 01/13/2011  . Hypertension 12/06/2010  . ED (erectile dysfunction) 08/25/2010  . CLAUSTROPHOBIA 03/01/2010    Past Medical History:  Diagnosis Date  . Cancer [facility])    prostate  . Claustrophobia    quite severe  . Hypertension   . Hypoglycemia    occ  . Left knee DJD    Xray 12/23/08  . Prostate cancer [facility])     Family History  Problem Relation Age of Onset  . Prostate cancer Father   . Prostate cancer Brother   . Colon cancer Neg Hx   . Rectal cancer Neg Hx   . Stomach cancer Neg Hx     Past Surgical History:  Procedure Laterality Date  . CYSTOSCOPY  04/11/2018   Procedure: CYSTOSCOPY FLEXIBLE;  Surgeon: [name], MD;  Location: [facility];  Service: Urology;;  NO SEEDS FOUND IN BLADDER  . HERNIA REPAIR  2009   inguinal  . JOINT REPLACEMENT Bilateral 2017   knees  . RADIOACTIVE SEED IMPLANT N/A 04/11/2018   Procedure: RADIOACTIVE SEED IMPLANT/BRACHYTHERAPY IMPLANT;  Surgeon: [name], MD;  Location: [facility];  Service: Urology;  Laterality: N/A;   71     SEEDS IMPLANTED  . SPACE OAR INSTILLATION N/A 04/11/2018   Procedure: SPACE OAR INSTILLATION;  Surgeon: [name], MD;  Location: [facility];  Service: Urology;  Laterality: N/A;  . TOTAL KNEE ARTHROPLASTY Bilateral 09/13/2015   Procedure: BILATERAL KNEE ARTHROPLASTY ;  Surgeon: Paralee Cancel, MD;  [facility];  Service: Orthopedics;  Laterality: Bilateral;   Social History   Occupational History  . Not on file  Tobacco Use  . Smoking status: Never Smoker  . Smokeless tobacco: Never Used  Substance and Sexual Activity  . Alcohol use: No    Alcohol/week: 0.0 standard drinks  . Drug use: Not Currently  . Sexual activity: Yes

## 2019-07-09 ENCOUNTER — Other Ambulatory Visit: Payer: Self-pay

## 2019-07-09 ENCOUNTER — Ambulatory Visit (INDEPENDENT_AMBULATORY_CARE_PROVIDER_SITE_OTHER): Payer: Medicare Other

## 2019-07-09 ENCOUNTER — Telehealth: Payer: Self-pay

## 2019-07-09 ENCOUNTER — Ambulatory Visit: Payer: Medicare Other | Admitting: Podiatry

## 2019-07-09 VITALS — Temp 98.0°F

## 2019-07-09 DIAGNOSIS — M79672 Pain in left foot: Secondary | ICD-10-CM

## 2019-07-09 DIAGNOSIS — L97522 Non-pressure chronic ulcer of other part of left foot with fat layer exposed: Secondary | ICD-10-CM

## 2019-07-10 ENCOUNTER — Telehealth: Payer: Self-pay | Admitting: *Deleted

## 2019-07-10 ENCOUNTER — Encounter: Payer: Self-pay | Admitting: Podiatry

## 2019-07-10 DIAGNOSIS — L97522 Non-pressure chronic ulcer of other part of left foot with fat layer exposed: Secondary | ICD-10-CM

## 2019-07-10 DIAGNOSIS — E291 Testicular hypofunction: Secondary | ICD-10-CM | POA: Diagnosis not present

## 2019-07-10 DIAGNOSIS — M79671 Pain in right foot: Secondary | ICD-10-CM

## 2019-07-10 DIAGNOSIS — M79672 Pain in left foot: Secondary | ICD-10-CM

## 2019-07-10 NOTE — Telephone Encounter (Signed)
-----   Message from Thomas [name], DPM sent at 07/10/2019  9:18 AM EDT ----- Regarding: ABIs PVR stat Hi Koron Godeaux,  Can you order his ABIs PVRs for this patient to assess the vascular flow especially to the left lower extremity for planned surgical intervention.  Patient has calcification in the x-rays.  Thanks Lennette Bihari

## 2019-07-10 NOTE — Progress Notes (Addendum)
Subjective:  Patient ID: Thomas [name], male    DOB: 09/28/1950,  MRN: [id number]  Chief Complaint  Patient presents with  . Wound Check    Left plantar forefoot ulcer check. Pt states it is not healing, denies fever/nausea/vomiting/chills, denies drainage.    69 y.o. male presents for wound care.  Patient presents to me for left submetatarsal 2 ulceration that is chronic in nature.  Left submetatarsal 2 ulceration that is chronic in nature.  Due to excessive pressure to the plantar forefoot.  Patient was treated by Dr. Paulla Dolly for quite some period but is not able to heal the wound.  Patient tends to work very excessively on his feet and is unable to take too much time off.  Therefore this ulceration is not able to properly heal with padding and offloading shoe gear modification orthotics management.  At this time I believe patient will benefit from surgical intervention as there is a high risk for amputation if the wound probes any deeper down to the bone.  Patient has failed all conservative therapy including local wound care antibiotics offloading shoe gear modification.  He denies any other acute complaints.  Patient is currently on methotrexate and prednisone which makes the healing a bit difficult as well.   Review of Systems: Negative except as noted in the HPI. Denies N/V/F/Ch.  Past Medical History:  Diagnosis Date  . Cancer [facility])    prostate  . Claustrophobia    quite severe  . Hypertension   . Hypoglycemia    occ  . Left knee DJD    Xray 12/23/08  . Prostate cancer ([place])     Current Outpatient Medications:  .  diazepam (VALIUM) 10 MG tablet, INSERT 1 TABLET RECTALLY PRIOR TO BEDTIME, Disp: , Rfl:  .  doxycycline (VIBRA-TABS) 100 MG tablet, Take 1 tablet (100 mg total) by mouth 2 (two) times daily., Disp: 60 tablet, Rfl: 0 .  ENBREL SURECLICK 50 MG/ML injection, Inject into the skin once a week., Disp: , Rfl:  .  folic acid (FOLVITE) 1 MG tablet, Take 1 mg by mouth daily.,  Disp: , Rfl:  .  hydrOXYzine (ATARAX/VISTARIL) 25 MG tablet, Take 0.5-1 tablets (12.5-25 mg total) by mouth every 8 (eight) hours as needed for itching., Disp: 30 tablet, Rfl: 0 .  lisinopril (ZESTRIL) 20 MG tablet, TAKE 1 TABLET BY MOUTH EVERY DAY, Disp: 90 tablet, Rfl: 0 .  meloxicam (MOBIC) 15 MG tablet, TAKE 1 TABLET BY MOUTH EVERY DAY, Disp: 90 tablet, Rfl: 0 .  methotrexate (RHEUMATREX) 2.5 MG tablet, 8 TABLETS ONCE A WEEK ORALLY 28 DAYS, Disp: , Rfl:  .  Multiple Vitamin (MULTIVITAMIN WITH MINERALS) TABS tablet, Take 1 tablet by mouth daily., Disp: , Rfl:  .  NARCAN 4 MG/0.1ML LIQD nasal spray kit, USE AS DIRECTED AS NEEDED, Disp: , Rfl:  .  oxyCODONE-acetaminophen (PERCOCET) 10-325 MG tablet, TAKE 1 TABLET BY MOUTH EVERY 4 TO 6 HOURS, Disp: , Rfl: 0 .  predniSONE (DELTASONE) 10 MG tablet, Take 10 mg by mouth daily with breakfast., Disp: , Rfl:  .  tamsulosin (FLOMAX) 0.4 MG CAPS capsule, Take 1 capsule (0.4 mg total) by mouth daily., Disp: 30 capsule, Rfl: 11 .  testosterone enanthate (DELATESTRYL) 200 MG/ML injection, 0.5 ML IM Q1WK, Disp: , Rfl:   Social History   Tobacco Use  Smoking Status Never Smoker  Smokeless Tobacco Never Used    Allergies  Allergen Reactions  . Chlorthalidone Other (See Comments)    "Makes  me light headed and I don't like the way it makes me feel"   Objective:   Vitals:   07/09/19 1034  Temp: 98 F (36.7 C)   There is no height or weight on file to calculate BMI. Constitutional Well developed. Well nourished.  Vascular Dorsalis pedis faintly pulses palpable bilaterally. Posterior tibial faintly pulses palpable bilaterally. Capillary refill normal to all digits.  No cyanosis or clubbing noted. Pedal hair growth normal.  Neurologic Normal speech. Oriented to person, place, and time. Protective sensation absent  Dermatologic Wound Location: Left submetatarsal 2 probes down to deep tissue no exposure of bone.  No purulent drainage noted.  No  other clinical signs of infection noted. Wound Base: Mixed Granular/Fibrotic Peri-wound: Normal Exudate: Scant/small amount Serous exudate Wound Measurements: -See below  Orthopedic: No pain to palpation either foot.   Radiographs: 3 view of skeletally mature adult foot: Calcification is noted at the posterior tibial artery.  Mild midfoot arthritis noted.  No cortical irregularity or destruction noted at the metatarsal head.  Mild midfoot arthritis noted.  No other bony abnormalities noted.  Assessment:   1. Ulcerated, foot, left, with fat layer exposed ([place])    Plan:  Patient was evaluated and treated and all questions answered.  Ulcer submetatarsal 2 left foot -Debridement as below. -Dressed with Santyl wet-to-dry dressings, DSD. -Continue off-loading with surgical shoe. -I discussed all my findings with the patient in extensive detail.  I also discussed that patient has questionable circulation down to the lower extremity and I would like to assess this further before making an incision.  I believe patient will benefit from ABIs PVRs and vascular work-up prior to the surgical intervention. -I am also worried that if the end of taking the metatarsal head out he will end up getting transfer metatarsalgia and another ulceration submet 3 or 4. -I believe patient is a ideal candidate for floating osteotomy of the second metatarsal to allow the body to find his natural position and properly heal with time.  I discussed this procedure in extensive detail with the patient.  I discussed all the risks and complication for this procedure with the patient.  Ultimately, the benefits and the risks were discussed with the patient.  If there is an exposure of the bone in near future without closure of the wound patient is likely going to lose the metatarsal head of the second.  Therefore I believe patient will benefit from this procedure as he cannot take time off of work and will need to continuously put  pressure.  This osteotomy will allow him to put pressure with a surgical shoe and allow the bone to find his natural position and therefore reducing the pressure on the plantar forefoot. -I will discuss this procedure further during next visit after ABIs PVR work-up is done. -For now we will continue to do local wound care monitor for any clinical signs of infection.  I have asked to go to the emergency department if the symptoms gets worse or if the foot does not look good.  Patient states understanding and will do so.   Procedure: Excisional Debridement of Wound Rationale: Removal of non-viable soft tissue from the wound to promote healing.  Anesthesia: none Pre-Debridement Wound Measurements: 2.5 cm x 2.5 cm x 0.5 centimeters. Post-Debridement Wound Measurements: Same Type of Debridement: Sharp Excisional Tissue Removed: Non-viable soft tissue Depth of Debridement: subcutaneous tissue. Technique: Sharp excisional debridement to bleeding, viable wound base.  Dressing: Dry, sterile, compression dressing. Disposition: Patient  tolerated procedure well. Patient to return in 1 week for follow-up.  No follow-ups on file.

## 2019-07-10 NOTE — Telephone Encounter (Signed)
Faxed orders to [facility]. 

## 2019-07-10 NOTE — Telephone Encounter (Signed)
I called him already. Thanks

## 2019-07-17 ENCOUNTER — Other Ambulatory Visit: Payer: Self-pay | Admitting: Orthopedic Surgery

## 2019-07-17 DIAGNOSIS — M25529 Pain in unspecified elbow: Secondary | ICD-10-CM | POA: Diagnosis not present

## 2019-07-17 DIAGNOSIS — G894 Chronic pain syndrome: Secondary | ICD-10-CM | POA: Diagnosis not present

## 2019-07-17 DIAGNOSIS — S73199A Other sprain of unspecified hip, initial encounter: Secondary | ICD-10-CM | POA: Diagnosis not present

## 2019-07-17 DIAGNOSIS — M069 Rheumatoid arthritis, unspecified: Secondary | ICD-10-CM | POA: Diagnosis not present

## 2019-07-17 NOTE — Telephone Encounter (Signed)
See message below from [place].

## 2019-07-17 NOTE — Telephone Encounter (Signed)
This is an XU pt.

## 2019-07-17 NOTE — Telephone Encounter (Signed)
Dr Sharol Given is seeing him for his foot and started him on this at his last visit.  Will you see what duda team wants to do?

## 2019-07-21 ENCOUNTER — Ambulatory Visit (HOSPITAL_COMMUNITY)
Admission: RE | Admit: 2019-07-21 | Payer: No Typology Code available for payment source | Source: Ambulatory Visit | Attending: Podiatry | Admitting: Podiatry

## 2019-07-23 ENCOUNTER — Other Ambulatory Visit: Payer: Self-pay | Admitting: Family Medicine

## 2019-07-24 ENCOUNTER — Ambulatory Visit: Payer: No Typology Code available for payment source

## 2019-07-30 ENCOUNTER — Ambulatory Visit (HOSPITAL_COMMUNITY)
Admission: RE | Admit: 2019-07-30 | Payer: No Typology Code available for payment source | Source: Ambulatory Visit | Attending: Podiatry | Admitting: Podiatry

## 2019-07-31 ENCOUNTER — Encounter (HOSPITAL_COMMUNITY): Payer: Self-pay

## 2019-08-05 ENCOUNTER — Other Ambulatory Visit: Payer: Self-pay | Admitting: Physician Assistant

## 2019-08-07 ENCOUNTER — Ambulatory Visit: Payer: Medicare Other | Admitting: Orthopaedic Surgery

## 2019-08-08 ENCOUNTER — Encounter: Payer: Self-pay | Admitting: Orthopaedic Surgery

## 2019-08-08 ENCOUNTER — Ambulatory Visit: Payer: Medicare Other | Admitting: Orthopaedic Surgery

## 2019-08-08 ENCOUNTER — Other Ambulatory Visit: Payer: Self-pay

## 2019-08-08 VITALS — Ht 79.0 in | Wt 238.0 lb

## 2019-08-08 DIAGNOSIS — G8929 Other chronic pain: Secondary | ICD-10-CM

## 2019-08-08 DIAGNOSIS — M25551 Pain in right hip: Secondary | ICD-10-CM | POA: Diagnosis not present

## 2019-08-08 MED ORDER — PREDNISONE 10 MG (21) PO TBPK: ORAL_TABLET | ORAL | 0 refills | Status: DC

## 2019-08-08 NOTE — Progress Notes (Signed)
Office Visit Note   Patient: Thomas [name]           Date of Birth: 08-26-1950           MRN: [id number] Visit Date: 08/08/2019              Requested by: [name], MD [place],  [place] [address] PCP: [name], MD   Assessment & Plan: Visit Diagnoses:  1. Chronic right hip pain     Plan: Impression is continued right trochanteric bursitis and IT band syndrome.  I did discuss with him that is too soon to get a repeat injection.  I gave him exercises and resistance bands.  Will give him a prescription for prednisone Dosepak to try.  I also urged that he needs to back off physically with his job as this is taking its toll on his body.  We will see him back as needed.  Follow-Up Instructions: Return if symptoms worsen or fail to improve.   Orders:  No orders of the defined types were placed in this encounter.  Meds ordered this encounter  Medications  . predniSONE (STERAPRED UNI-PAK 21 TAB) 10 MG (21) TBPK tablet    Sig: Take as directed    Dispense:  21 tablet    Refill:  0      Procedures: No procedures performed   Clinical Data: No additional findings.   Subjective: Chief Complaint  Patient presents with  . Right Hip - Pain    Patient comes in today for follow-up of continued right hip pain.  We injected his trochanteric bursa a 6 weeks ago.  He continues to have constant lateral pain that is worse with bending and twisting.   Review of Systems   Objective: Vital Signs: Ht 6\' 7"  (2.007 m)   Wt 238 lb (108 kg)   BMI 26.81 kg/m   Physical Exam  Ortho Exam Right hip shows tenderness at the greater trochanter.  Pain with hip abduction. Specialty Comments:  No specialty comments available.  Imaging: No results found.   PMFS History: Patient Active Problem List   Diagnosis Date Noted  . Chronic right hip pain 06/19/2019  . Degenerative tear of acetabular labrum of right hip 05/02/2019  . Primary  osteoarthritis of right hip 05/02/2019  . Skin ulcer ([place]) 01/29/2019  . Malignant neoplasm of prostate ([place]) 01/16/2018  . Insomnia 10/02/2017  . Cyclic citrullinated peptide (CCP) antibody positive 05/31/2016  . S/P bilateral TKA 09/13/2015  . Other bilateral secondary osteoarthritis of knee 07/14/2014  . Heme positive stool 05/21/2013  . Osteoarthritis, multiple sites 01/13/2011  . Back pain 01/13/2011  . Hypertension 12/06/2010  . ED (erectile dysfunction) 08/25/2010  . CLAUSTROPHOBIA 03/01/2010   Past Medical History:  Diagnosis Date  . Cancer [facility])    prostate  . Claustrophobia    quite severe  . Hypertension   . Hypoglycemia    occ  . Left knee DJD    Xray 12/23/08  . Prostate cancer [facility])     Family History  Problem Relation Age of Onset  . Prostate cancer Father   . Prostate cancer Brother   . Colon cancer Neg Hx   . Rectal cancer Neg Hx   . Stomach cancer Neg Hx     Past Surgical History:  Procedure Laterality Date  . CYSTOSCOPY  04/11/2018   Procedure: CYSTOSCOPY FLEXIBLE;  Surgeon: [name], MD;  Location: [facility];  Service:  Urology;;  NO SEEDS FOUND IN BLADDER  . HERNIA REPAIR  2009   inguinal  . JOINT REPLACEMENT Bilateral 2017   knees  . RADIOACTIVE SEED IMPLANT N/A 04/11/2018   Procedure: RADIOACTIVE SEED IMPLANT/BRACHYTHERAPY IMPLANT;  Surgeon: [name], MD;  Location: [facility];  Service: Urology;  Laterality: N/A;   69     SEEDS IMPLANTED  . SPACE OAR INSTILLATION N/A 04/11/2018   Procedure: SPACE OAR INSTILLATION;  Surgeon: [name], MD;  Location: [facility];  Service: Urology;  Laterality: N/A;  . TOTAL KNEE ARTHROPLASTY Bilateral 09/13/2015   Procedure: BILATERAL KNEE ARTHROPLASTY ;  Surgeon: Paralee Cancel, MD;  Location: [facility];  Service: Orthopedics;  Laterality: Bilateral;   Social History   Occupational History  . Not on file  Tobacco Use  . Smoking  status: Never Smoker  . Smokeless tobacco: Never Used  Substance and Sexual Activity  . Alcohol use: No    Alcohol/week: 0.0 standard drinks  . Drug use: Not Currently  . Sexual activity: Yes

## 2019-08-12 ENCOUNTER — Other Ambulatory Visit: Payer: Self-pay | Admitting: Podiatry

## 2019-08-12 ENCOUNTER — Other Ambulatory Visit: Payer: Self-pay

## 2019-08-12 ENCOUNTER — Ambulatory Visit (HOSPITAL_COMMUNITY)
Admission: RE | Admit: 2019-08-12 | Discharge: 2019-08-12 | Disposition: A | Discharge status: Home / Self Care | Payer: Medicare Other | Source: Ambulatory Visit | Attending: Cardiovascular Disease | Admitting: Cardiovascular Disease

## 2019-08-12 DIAGNOSIS — L97522 Non-pressure chronic ulcer of other part of left foot with fat layer exposed: Secondary | ICD-10-CM

## 2019-08-12 DIAGNOSIS — M79671 Pain in right foot: Secondary | ICD-10-CM

## 2019-08-12 DIAGNOSIS — M79672 Pain in left foot: Secondary | ICD-10-CM

## 2019-08-13 DIAGNOSIS — S73199A Other sprain of unspecified hip, initial encounter: Secondary | ICD-10-CM | POA: Diagnosis not present

## 2019-08-13 DIAGNOSIS — Z79891 Long term (current) use of opiate analgesic: Secondary | ICD-10-CM | POA: Diagnosis not present

## 2019-08-13 DIAGNOSIS — G894 Chronic pain syndrome: Secondary | ICD-10-CM | POA: Diagnosis not present

## 2019-08-13 DIAGNOSIS — Z79899 Other long term (current) drug therapy: Secondary | ICD-10-CM | POA: Diagnosis not present

## 2019-08-13 DIAGNOSIS — M25529 Pain in unspecified elbow: Secondary | ICD-10-CM | POA: Diagnosis not present

## 2019-08-13 DIAGNOSIS — M069 Rheumatoid arthritis, unspecified: Secondary | ICD-10-CM | POA: Diagnosis not present

## 2019-08-19 ENCOUNTER — Other Ambulatory Visit: Payer: Self-pay | Admitting: Family Medicine

## 2019-08-19 ENCOUNTER — Telehealth: Payer: Self-pay | Admitting: *Deleted

## 2019-08-19 NOTE — Telephone Encounter (Signed)
Left message informing pt, I had reviewed our clinicals and our doctors had not ordered advanced imaging, but did see that Dr. Sharol Given had, and he would need to contact their office for the results.

## 2019-08-19 NOTE — Telephone Encounter (Signed)
I spoke with pt and he states he had a scan last week. I asked if it was for circulation and he stated yes. I reviewed CV Proc and doppler results were available. I told pt I would inform Dr. Posey Pronto and call with his review of the results and instructions.

## 2019-08-19 NOTE — Telephone Encounter (Signed)
Pt requested a return call.

## 2019-08-19 NOTE — Telephone Encounter (Signed)
Pt states he had an scan 1 week ago and is calling for the results.

## 2019-08-20 NOTE — Telephone Encounter (Signed)
Pt called and I informed of Dr. Serita Grit review of dopplers. Pt asked if Dr. Posey Pronto had said anything about surgery. I told pt that he would need to make an appt to discuss surgery with Dr. Posey Pronto and I would have a scheduler call to assist.

## 2019-08-20 NOTE — Telephone Encounter (Signed)
I reviewed the ABI/PVR. They were normal and has good flow to both lower extremity.   Thanks

## 2019-08-20 NOTE — Telephone Encounter (Signed)
Left message for pt to call for results  

## 2019-08-22 ENCOUNTER — Other Ambulatory Visit: Payer: Self-pay | Admitting: Physician Assistant

## 2019-09-10 DIAGNOSIS — M25529 Pain in unspecified elbow: Secondary | ICD-10-CM | POA: Diagnosis not present

## 2019-09-10 DIAGNOSIS — S73199A Other sprain of unspecified hip, initial encounter: Secondary | ICD-10-CM | POA: Diagnosis not present

## 2019-09-10 DIAGNOSIS — M069 Rheumatoid arthritis, unspecified: Secondary | ICD-10-CM | POA: Diagnosis not present

## 2019-09-10 DIAGNOSIS — G894 Chronic pain syndrome: Secondary | ICD-10-CM | POA: Diagnosis not present

## 2019-09-23 ENCOUNTER — Telehealth: Payer: Self-pay | Admitting: *Deleted

## 2019-09-23 ENCOUNTER — Other Ambulatory Visit: Payer: Self-pay | Admitting: *Deleted

## 2019-09-23 NOTE — Telephone Encounter (Signed)
I spoke with pt and he states there has been no change in his foot, denies increase in redness, swelling or drainage. Pt states his is continuing to soak the foot in epsom salt and had no change. I told pt Dr. Paulla Dolly generally does not like to put pts on antibiotics for longer than 10 days and I needed his orders since pt's next appt is 10/10/2019. Pt states understanding.

## 2019-09-23 NOTE — Telephone Encounter (Signed)
Patient is Requesting a refill of antibiotic (Doxycycline-100mg ),has been out for 2 days now and does not want his condition to become worse. This prescription was recently filled by Dr. Audrea Muscat Persons. His pharmacy is on file and patient is wanting to know if he should schedule a sooner appt.(June 18th ) to see Dr Posey Pronto.

## 2019-09-24 ENCOUNTER — Telehealth: Payer: Self-pay | Admitting: Orthopaedic Surgery

## 2019-09-24 NOTE — Telephone Encounter (Signed)
Pt called wanting to know if he would be in the time frame to get another hip injection; last one was on 06/19/19?  [phone]

## 2019-09-24 NOTE — Telephone Encounter (Signed)
[place] for lhim to have refill

## 2019-09-24 NOTE — Telephone Encounter (Signed)
He can have one now

## 2019-09-25 ENCOUNTER — Telehealth: Payer: Self-pay | Admitting: Podiatry

## 2019-09-25 MED ORDER — DOXYCYCLINE HYCLATE 100 MG PO TABS: 100.0000 mg | ORAL_TABLET | Freq: Two times a day (BID) | ORAL | 0 refills | Status: DC

## 2019-09-25 NOTE — Telephone Encounter (Signed)
I informed pt the Doxycycline had been okayed to order by Dr. Paulla Dolly on 09/24/2019, but I was not in office 09/24/2019. I informed pt the orders had been called to [facility] on [address].

## 2019-09-25 NOTE — Addendum Note (Signed)
Addended by: Harriett Sine D on: 09/25/2019 09:40 AM   Modules accepted: Orders

## 2019-09-25 NOTE — Telephone Encounter (Signed)
completed

## 2019-09-25 NOTE — Telephone Encounter (Signed)
DR. Paulla Dolly ordered fill Doxycycline 100mg  #20 bid on 09/24/2019.

## 2019-09-25 NOTE — Telephone Encounter (Signed)
Pt called asking about refill on antibiotics  I seen the previous message about the  the refill but pt still has yet to receive it please advise

## 2019-09-26 NOTE — Telephone Encounter (Signed)
me

## 2019-09-26 NOTE — Telephone Encounter (Signed)
You or [facility]?

## 2019-09-26 NOTE — Telephone Encounter (Signed)
Tried calling patient. No answer. LMVM advising for him to return call and schedule appt with Dr Erlinda Hong for injection.

## 2019-09-29 ENCOUNTER — Ambulatory Visit: Payer: Medicare Other | Admitting: Family Medicine

## 2019-10-01 ENCOUNTER — Other Ambulatory Visit: Payer: Self-pay

## 2019-10-01 ENCOUNTER — Encounter: Payer: Self-pay | Admitting: Family Medicine

## 2019-10-01 ENCOUNTER — Ambulatory Visit: Payer: Self-pay

## 2019-10-01 ENCOUNTER — Ambulatory Visit (INDEPENDENT_AMBULATORY_CARE_PROVIDER_SITE_OTHER): Payer: Medicare Other | Admitting: Family Medicine

## 2019-10-01 DIAGNOSIS — M25551 Pain in right hip: Secondary | ICD-10-CM

## 2019-10-01 DIAGNOSIS — G8929 Other chronic pain: Secondary | ICD-10-CM | POA: Diagnosis not present

## 2019-10-01 NOTE — Progress Notes (Signed)
Office Visit Note   Patient: Thomas [name]           Date of Birth: [birth year]           MRN: [id number] Visit Date: 10/01/2019 Requested by: [name], MD [place],  [place] [address] PCP: [name], MD  Subjective: Chief Complaint  Patient presents with  . Right Hip - Injections    Per Erlinda Hong    HPI: He is here for right hip pain.  Pain on the lateral aspect.  In January I did an intra-articular injection which did not help much.  Then in February he received a greater trochanter injection which helped much more.  It has been hurting again in the last couple months and he would like to try another injection.              ROS:   All other systems were reviewed and are negative.  Objective: Vital Signs: There were no vitals taken for this visit.  Physical Exam:  General:  Alert and oriented, in no acute distress. Pulm:  Breathing unlabored. Psy:  Normal mood, congruent affect. Skin: No rash Right hip: He is point tender on the posterior lateral aspect of the greater trochanter.  Minimal pain with internal hip rotation.  Imaging: US Guided Needle Placement - No Linked Charges  Result Date: 10/01/2019 Ultrasound-guided right hip injection: After sterile prep with Betadine, injected 8 cc 1% lidocaine without epinephrine and 40 mg methylprednisolone using a 22-gauge spinal needle, passing the needle through the gluteus medius tendon at the greater trochanter where there appears to be a small intrasubstance partial tear.  He had good immediate relief.  Follow-up as needed.    Assessment & Plan: 1.  Right hip greater trochanter syndrome -Injection today as above.  Follow-up as needed.     Procedures: No procedures performed  No notes on file     PMFS History: Patient Active Problem List   Diagnosis Date Noted  . Chronic right hip pain 06/19/2019  . Degenerative tear of acetabular labrum of right hip 05/02/2019  . Primary  osteoarthritis of right hip 05/02/2019  . Skin ulcer ([place]) 01/29/2019  . Malignant neoplasm of prostate ([place]) 01/16/2018  . Insomnia 10/02/2017  . Cyclic citrullinated peptide (CCP) antibody positive 05/31/2016  . S/P bilateral TKA 09/13/2015  . Other bilateral secondary osteoarthritis of knee 07/14/2014  . Heme positive stool 05/21/2013  . Osteoarthritis, multiple sites 01/13/2011  . Back pain 01/13/2011  . Hypertension 12/06/2010  . ED (erectile dysfunction) 08/25/2010  . CLAUSTROPHOBIA 03/01/2010   Past Medical History:  Diagnosis Date  . Cancer [facility])    prostate  . Claustrophobia    quite severe  . Hypertension   . Hypoglycemia    occ  . Left knee DJD    Xray 12/23/08  . Prostate cancer [facility])     Family History  Problem Relation Age of Onset  . Prostate cancer Father   . Prostate cancer Brother   . Colon cancer Neg Hx   . Rectal cancer Neg Hx   . Stomach cancer Neg Hx     Past Surgical History:  Procedure Laterality Date  . CYSTOSCOPY  04/11/2018   Procedure: CYSTOSCOPY FLEXIBLE;  Surgeon: [name], MD;  Location: [facility];  Service: Urology;;  NO SEEDS FOUND IN BLADDER  . HERNIA REPAIR  2009   inguinal  . JOINT REPLACEMENT Bilateral 2017   knees  .  RADIOACTIVE SEED IMPLANT N/A 04/11/2018   Procedure: RADIOACTIVE SEED IMPLANT/BRACHYTHERAPY IMPLANT;  Surgeon: [name], MD;  Location: [facility];  Service: Urology;  Laterality: N/A;   69     SEEDS IMPLANTED  . SPACE OAR INSTILLATION N/A 04/11/2018   Procedure: SPACE OAR INSTILLATION;  Surgeon: [name], MD;  Location[facility];  Service: Urology;  Laterality: N/A;  . TOTAL KNEE ARTHROPLASTY Bilateral 09/13/2015   Procedure: BILATERAL KNEE ARTHROPLASTY ;  Surgeon: Paralee Cancel, MD;  Location: [facility];  Service: Orthopedics;  Laterality: Bilateral;   Social History   Occupational History  . Not on file  Tobacco Use  . Smoking  status: Never Smoker  . Smokeless tobacco: Never Used  Substance and Sexual Activity  . Alcohol use: No    Alcohol/week: 0.0 standard drinks  . Drug use: Not Currently  . Sexual activity: Yes

## 2019-10-06 DIAGNOSIS — R300 Dysuria: Secondary | ICD-10-CM | POA: Diagnosis not present

## 2019-10-06 DIAGNOSIS — N401 Enlarged prostate with lower urinary tract symptoms: Secondary | ICD-10-CM | POA: Diagnosis not present

## 2019-10-06 DIAGNOSIS — R351 Nocturia: Secondary | ICD-10-CM | POA: Diagnosis not present

## 2019-10-06 DIAGNOSIS — Z8546 Personal history of malignant neoplasm of prostate: Secondary | ICD-10-CM | POA: Diagnosis not present

## 2019-10-06 DIAGNOSIS — E291 Testicular hypofunction: Secondary | ICD-10-CM | POA: Diagnosis not present

## 2019-10-08 DIAGNOSIS — M069 Rheumatoid arthritis, unspecified: Secondary | ICD-10-CM | POA: Diagnosis not present

## 2019-10-08 DIAGNOSIS — S73199A Other sprain of unspecified hip, initial encounter: Secondary | ICD-10-CM | POA: Diagnosis not present

## 2019-10-08 DIAGNOSIS — G894 Chronic pain syndrome: Secondary | ICD-10-CM | POA: Diagnosis not present

## 2019-10-08 DIAGNOSIS — M25529 Pain in unspecified elbow: Secondary | ICD-10-CM | POA: Diagnosis not present

## 2019-10-10 ENCOUNTER — Ambulatory Visit: Payer: Medicare Other | Admitting: Podiatry

## 2019-10-10 ENCOUNTER — Other Ambulatory Visit: Payer: Self-pay

## 2019-10-10 ENCOUNTER — Ambulatory Visit (INDEPENDENT_AMBULATORY_CARE_PROVIDER_SITE_OTHER): Payer: Medicare Other

## 2019-10-10 DIAGNOSIS — L97522 Non-pressure chronic ulcer of other part of left foot with fat layer exposed: Secondary | ICD-10-CM | POA: Diagnosis not present

## 2019-10-10 DIAGNOSIS — Z01818 Encounter for other preprocedural examination: Secondary | ICD-10-CM

## 2019-10-10 DIAGNOSIS — M216X2 Other acquired deformities of left foot: Secondary | ICD-10-CM | POA: Diagnosis not present

## 2019-10-10 NOTE — Patient Instructions (Signed)
Pre-Operative Instructions  Congratulations, you have decided to take an important step towards improving your quality of life.  You can be assured that the doctors and staff at [facility] will be with you every step of the way.  Here are some important things you should know:  1. Plan to be at the surgery center/hospital at least 1 (one) hour prior to your scheduled time, unless otherwise directed by the surgical center/hospital staff.  You must have a responsible adult accompany you, remain during the surgery and drive you home.  Make sure you have directions to the surgical center/hospital to ensure you arrive on time. 2. If you are having surgery at [facility] or [facility], you will need a copy of your medical history and physical form from your family physician within one month prior to the date of surgery. We will give you a form for your primary physician to complete.  3. We make every effort to accommodate the date you request for surgery.  However, there are times where surgery dates or times have to be moved.  We will contact you as soon as possible if a change in schedule is required.   4. No aspirin/ibuprofen for one week before surgery.  If you are on aspirin, any non-steroidal anti-inflammatory medications (Mobic, Aleve, Ibuprofen) should not be taken seven (7) days prior to your surgery.  You make take Tylenol for pain prior to surgery.  5. Medications - If you are taking daily heart and blood pressure medications, seizure, reflux, allergy, asthma, anxiety, pain or diabetes medications, make sure you notify the surgery center/hospital before the day of surgery so they can tell you which medications you should take or avoid the day of surgery. 6. No food or drink after midnight the night before surgery unless directed otherwise by surgical center/hospital staff. 7. No alcoholic beverages 24-hours prior to surgery.  No smoking 24-hours prior or 24-hours after  surgery. 8. Wear loose pants or shorts. They should be loose enough to fit over bandages, boots, and casts. 9. Don't wear slip-on shoes. Sneakers are preferred. 10. Bring your boot with you to the surgery center/hospital.  Also bring crutches or a walker if your physician has prescribed it for you.  If you do not have this equipment, it will be provided for you after surgery. 11. If you have not been contacted by the surgery center/hospital by the day before your surgery, call to confirm the date and time of your surgery. 12. Leave-time from work may vary depending on the type of surgery you have.  Appropriate arrangements should be made prior to surgery with your employer. 13. Prescriptions will be provided immediately following surgery by your doctor.  Fill these as soon as possible after surgery and take the medication as directed. Pain medications will not be refilled on weekends and must be approved by the doctor. 14. Remove nail polish on the operative foot and avoid getting pedicures prior to surgery. 15. Wash the night before surgery.  The night before surgery wash the foot and leg well with water and the antibacterial soap provided. Be sure to pay special attention to beneath the toenails and in between the toes.  Wash for at least three (3) minutes. Rinse thoroughly with water and dry well with a towel.  Perform this wash unless told not to do so by your physician.  Enclosed: 1 Ice pack (please put in freezer the night before surgery)   1 Hibiclens skin cleaner     Pre-op instructions  If you have any questions regarding the instructions, please do not hesitate to call our office.  [place]: [address], , [place] [address] -- [phone]  [place]: [address] , [place] [address] -- [phone]  [place]: [address], [place], [place] [address] -- [phone]   Website: [email or web address] 

## 2019-10-13 ENCOUNTER — Encounter: Payer: Self-pay | Admitting: Podiatry

## 2019-10-13 NOTE — Progress Notes (Signed)
Subjective:  Patient ID: Thomas [name], male    DOB: Jul 20, 1950,  MRN: [id number]  Chief Complaint  Patient presents with  . Foot Pain    pt is here for left foot pain.    69 y.o. male presents for wound care.  Patient presents with to me for left submetatarsal 2 chronic ulceration.  I discussed with him floating osteotomy to help take the pressure off of the ulcer and therefore allow it to heal.  Patient has failed all extrinsic offloading.  At this time patient is here for surgical consultation for floating osteotomy.  Patient has been able to take some time off now and would like to discuss those options.  The ulcer has not changed.  He has been applying Santyl wet-to-dry dressings.  He has been walking on it.  There is no clinical signs of infection noted.  At this time patient has failed all conservative therapy including local wound care and antibiotics.  Review of Systems: Negative except as noted in the HPI. Denies N/V/F/Ch.  Past Medical History:  Diagnosis Date  . Cancer [facility])    prostate  . Claustrophobia    quite severe  . Hypertension   . Hypoglycemia    occ  . Left knee DJD    Xray 12/23/08  . Prostate cancer ([place])     Current Outpatient Medications:  .  diazepam (VALIUM) 10 MG tablet, INSERT 1 TABLET RECTALLY PRIOR TO BEDTIME, Disp: , Rfl:  .  doxycycline (VIBRA-TABS) 100 MG tablet, TAKE 1 TABLET BY MOUTH TWICE A DAY, Disp: 60 tablet, Rfl: 0 .  doxycycline (VIBRA-TABS) 100 MG tablet, Take 1 tablet (100 mg total) by mouth 2 (two) times daily., Disp: 20 tablet, Rfl: 0 .  ENBREL SURECLICK 50 MG/ML injection, Inject into the skin once a week., Disp: , Rfl:  .  folic acid (FOLVITE) 1 MG tablet, Take 1 mg by mouth daily., Disp: , Rfl:  .  hydrOXYzine (ATARAX/VISTARIL) 25 MG tablet, Take 0.5-1 tablets (12.5-25 mg total) by mouth every 8 (eight) hours as needed for itching., Disp: 30 tablet, Rfl: 0 .  lisinopril (ZESTRIL) 20 MG tablet, TAKE 1 TABLET BY MOUTH EVERY DAY, Disp:  90 tablet, Rfl: 0 .  meloxicam (MOBIC) 15 MG tablet, TAKE 1 TABLET BY MOUTH EVERY DAY, Disp: 90 tablet, Rfl: 1 .  methotrexate (RHEUMATREX) 2.5 MG tablet, 8 TABLETS ONCE A WEEK ORALLY 28 DAYS, Disp: , Rfl:  .  Multiple Vitamin (MULTIVITAMIN WITH MINERALS) TABS tablet, Take 1 tablet by mouth daily., Disp: , Rfl:  .  NARCAN 4 MG/0.1ML LIQD nasal spray kit, USE AS DIRECTED AS NEEDED, Disp: , Rfl:  .  oxyCODONE-acetaminophen (PERCOCET) 10-325 MG tablet, TAKE 1 TABLET BY MOUTH EVERY 4 TO 6 HOURS, Disp: , Rfl: 0 .  predniSONE (DELTASONE) 10 MG tablet, Take 10 mg by mouth daily with breakfast., Disp: , Rfl:  .  predniSONE (STERAPRED UNI-PAK 21 TAB) 10 MG (21) TBPK tablet, Take as directed, Disp: 21 tablet, Rfl: 0 .  tamsulosin (FLOMAX) 0.4 MG CAPS capsule, Take 1 capsule (0.4 mg total) by mouth daily., Disp: 30 capsule, Rfl: 11 .  testosterone enanthate (DELATESTRYL) 200 MG/ML injection, 0.5 ML IM Q1WK, Disp: , Rfl:   Social History   Tobacco Use  Smoking Status Never Smoker  Smokeless Tobacco Never Used    Allergies  Allergen Reactions  . Chlorthalidone Other (See Comments)    "Makes me light headed and I don't like the way it makes me  feel"   Objective:   There were no vitals filed for this visit. There is no height or weight on file to calculate BMI. Constitutional Well developed. Well nourished.  Vascular Dorsalis pedis faintly pulses palpable bilaterally. Posterior tibial faintly pulses palpable bilaterally. Capillary refill normal to all digits.  No cyanosis or clubbing noted. Pedal hair growth normal.  Neurologic Normal speech. Oriented to person, place, and time. Protective sensation absent  Dermatologic Wound Location: Left submetatarsal 2 probes down to deep tissue no exposure of bone.  No purulent drainage noted.  No other clinical signs of infection noted. Wound Base: Mixed Granular/Fibrotic Peri-wound: Normal Exudate: Scant/small amount Serous exudate Wound  Measurements: -See below  Orthopedic: No pain to palpation either foot.   Radiographs: 3 view of skeletally mature adult foot: Calcification is noted at the posterior tibial artery.  Mild midfoot arthritis noted.  No cortical irregularity or destruction noted at the metatarsal head.  Mild midfoot arthritis noted.  No other bony abnormalities noted.  Assessment:   1. Ulcerated, foot, left, with fat layer exposed ([place])   2. Preoperative examination   3. Plantar flexed metatarsal bone of left foot    Plan:  Patient was evaluated and treated and all questions answered.  Ulcer submetatarsal 2 left foot -Debridement as below. -Dressed with Santyl wet-to-dry dressings, DSD. -Continue off-loading with surgical shoe. -Another surgical shoe was dispensed -X-rays were reviewed and compared to prior films.  No new cortical irregularity or destruction noted.  I feel comfortable proceeding with floating osteotomy as discussed below. -I am also worried that if the end of taking the metatarsal head out he will end up getting transfer metatarsalgia and another ulceration submet 3 or 4. -I believe patient is a ideal candidate for floating osteotomy of the second metatarsal to allow the body to find his natural position and properly heal with time.  I discussed this procedure in extensive detail with the patient.  I discussed all the risks and complication for this procedure with the patient.  Ultimately, the benefits and the risks were discussed with the patient.  If there is an exposure of the bone in near future without closure of the wound patient is likely going to lose the metatarsal head of the second.  Therefore I believe patient will benefit from this procedure as he cannot take time off of work and will need to continuously put pressure.  This osteotomy will allow him to put pressure with a surgical shoe and allow the bone to find his natural position and therefore reducing the pressure on the plantar  forefoot. -ABIs PVRs were reviewed.  Good flow to the lower extremity noted. -For now we will continue to do local wound care monitor for any clinical signs of infection.  I have asked to go to the emergency department if the symptoms gets worse or if the foot does not look good.  Patient states understanding and will do so. -Informed surgical risk consent was reviewed and read aloud to the patient.  I reviewed the films.  I have discussed my findings with the patient in great detail.  I have discussed all risks including but not limited to infection, stiffness, scarring, limp, disability, deformity, damage to blood vessels and nerves, numbness, poor healing, need for braces, arthritis, chronic pain, amputation, death.  All benefits and realistic expectations discussed in great detail.  I have made no promises as to the outcome.  I have provided realistic expectations.  I have offered the patient a  2nd opinion, which they have declined and assured me they preferred to proceed despite the risks -A total of 36 minutes was spent in direct patient care as well as pre and post patient encounter activities.  This includes documentation as well as reviewing patient chart for labs, imaging, past medical, surgical, social, and family history as documented in the EMR.  I have reviewed medication allergies as documented in EMR.  I discussed the etiology of condition and treatment options from conservative to surgical care.  All risks and benefit of the treatment course was discussed in detail.  All questions were answered and return appointment was discussed.  Since the visit completed in an ambulatory/outpatient setting, the patient and/or parent/guardian has been advised to contact the providers office for worsening condition and seek medical treatment and/or call 911 if the patient deems either is necessary.    Procedure: Excisional Debridement of Wound~stagnant Rationale: Removal of non-viable soft tissue from the  wound to promote healing.  Anesthesia: none Pre-Debridement Wound Measurements: 2.5 cm x 2.5 cm x 0.5 centimeters. Post-Debridement Wound Measurements: Same Type of Debridement: Sharp Excisional Tissue Removed: Non-viable soft tissue Depth of Debridement: subcutaneous tissue. Technique: Sharp excisional debridement to bleeding, viable wound base.  Dressing: Dry, sterile, compression dressing. Disposition: Patient tolerated procedure well. Patient to return in 1 week for follow-up.  No follow-ups on file.

## 2019-10-15 ENCOUNTER — Telehealth: Payer: Self-pay

## 2019-10-15 NOTE — Telephone Encounter (Signed)
DOS 11/03/2019  METATARSAL OSTEOTOMY 2ND LT - 28308  [employer] MEDICARE EFFECTIVE DATE - 04/25/2019  PLAN DEDUCTIBLE - $0.00 OUT OF POCKET - $4200.00 W/ $9437.00 REMAINING COPAY $275.00 COINSURANCE - $0.00 NO AUTH REQUIRED

## 2019-10-23 ENCOUNTER — Other Ambulatory Visit: Payer: Self-pay | Admitting: Podiatry

## 2019-10-30 ENCOUNTER — Ambulatory Visit: Payer: Medicare Other | Admitting: Orthopaedic Surgery

## 2019-10-30 ENCOUNTER — Telehealth: Payer: Self-pay | Admitting: Orthopaedic Surgery

## 2019-10-30 ENCOUNTER — Ambulatory Visit: Payer: Self-pay

## 2019-10-30 ENCOUNTER — Encounter: Payer: Self-pay | Admitting: Orthopaedic Surgery

## 2019-10-30 VITALS — Ht >= 80 in | Wt 240.0 lb

## 2019-10-30 DIAGNOSIS — M25512 Pain in left shoulder: Secondary | ICD-10-CM | POA: Diagnosis not present

## 2019-10-30 MED ORDER — METAXALONE 800 MG PO TABS: 800.0000 mg | ORAL_TABLET | Freq: Three times a day (TID) | ORAL | 0 refills | Status: DC

## 2019-10-30 MED ORDER — METHYLPREDNISOLONE 4 MG PO TBPK: ORAL_TABLET | ORAL | 0 refills | Status: DC

## 2019-10-30 NOTE — Telephone Encounter (Signed)
Pls advise.  

## 2019-10-30 NOTE — Telephone Encounter (Signed)
Patient called requesting a new cheaper muscle relaxer meds. Pt [employer] will not cover muscle relaxer medication Dr. Erlinda Hong prescribed. Please send new script to pharmacy on file. Please call patient at [phone].

## 2019-10-30 NOTE — Progress Notes (Signed)
Office Visit Note   Patient: Thomas [name]           Date of Birth: Aug 10, 1950           MRN: [id number] Visit Date: 10/30/2019              Requested by: [name], MD [place],  [place] [id number] PCP: [name], MD   Assessment & Plan: Visit Diagnoses:  1. Acute pain of left shoulder     Plan: Impression is left trapezius myofascial strain.  I recommend heat, relative rest, Medrol Dosepak, Skelaxin.  Exam is reassuring that there are no rotator cuff issues.  Follow-up as needed.  Follow-Up Instructions: Return if symptoms worsen or fail to improve.   Orders:  Orders Placed This Encounter  Procedures  . XR Shoulder Left   Meds ordered this encounter  Medications  . metaxalone (SKELAXIN) 800 MG tablet    Sig: Take 1 tablet (800 mg total) by mouth 3 (three) times daily.    Dispense:  20 tablet    Refill:  0  . methylPREDNISolone (MEDROL DOSEPAK) 4 MG TBPK tablet    Sig: Use as directed    Dispense:  21 tablet    Refill:  0      Procedures: No procedures performed   Clinical Data: No additional findings.   Subjective: Chief Complaint  Patient presents with  . Left Shoulder - Pain    Thomas [name] comes in today for an acute injury to his left shoulder.  He was lifting something heavy over the weekend and his lifting partner slipped so all the weight was imparted onto his shoulder.  He has pain over the trapezius region of the shoulder.  He has occasional tingling at times.  Denies any weakness.  He has rheumatoid arthritis.   Review of Systems  Constitutional: Negative.   All other systems reviewed and are negative.    Objective: Vital Signs: Ht 6\' 8"  (2.032 m)   Wt 240 lb (108.9 kg)   BMI 26.37 kg/m   Physical Exam Vitals and nursing note reviewed.  Constitutional:      Appearance: He is well-developed.  Pulmonary:     Effort: Pulmonary effort is normal.  Abdominal:     Palpations: Abdomen is soft.   Skin:    General: Skin is warm.  Neurological:     Mental Status: He is alert and oriented to person, place, and time.  Psychiatric:        Behavior: Behavior normal.        Thought Content: Thought content normal.        Judgment: Judgment normal.     Ortho Exam Left shoulder shows full range of motion.  Normal strength to manual muscle testing.  He is mainly tender in the left trapezius muscle belly. Specialty Comments:  No specialty comments available.  Imaging: XR Shoulder Left  Result Date: 10/30/2019 No acute or structural abnormalities    PMFS History: Patient Active Problem List   Diagnosis Date Noted  . Chronic right hip pain 06/19/2019  . Degenerative tear of acetabular labrum of right hip 05/02/2019  . Primary osteoarthritis of right hip 05/02/2019  . Skin ulcer ([place]) 01/29/2019  . Malignant neoplasm of prostate ([place]) 01/16/2018  . Insomnia 10/02/2017  . Cyclic citrullinated peptide (CCP) antibody positive 05/31/2016  . S/P bilateral TKA 09/13/2015  . Other bilateral secondary osteoarthritis of knee 07/14/2014  . Heme positive stool 05/21/2013  .  Osteoarthritis, multiple sites 01/13/2011  . Back pain 01/13/2011  . Hypertension 12/06/2010  . ED (erectile dysfunction) 08/25/2010  . CLAUSTROPHOBIA 03/01/2010   Past Medical History:  Diagnosis Date  . Cancer [facility])    prostate  . Claustrophobia    quite severe  . Hypertension   . Hypoglycemia    occ  . Left knee DJD    Xray 12/23/08  . Prostate cancer [facility])     Family History  Problem Relation Age of Onset  . Prostate cancer Father   . Prostate cancer Brother   . Colon cancer Neg Hx   . Rectal cancer Neg Hx   . Stomach cancer Neg Hx     Past Surgical History:  Procedure Laterality Date  . CYSTOSCOPY  04/11/2018   Procedure: CYSTOSCOPY FLEXIBLE;  Surgeon: [name], MD;  Location: [facility];  Service: Urology;;  NO SEEDS FOUND IN BLADDER  . HERNIA REPAIR  2009    inguinal  . JOINT REPLACEMENT Bilateral 2017   knees  . RADIOACTIVE SEED IMPLANT N/A 04/11/2018   Procedure: RADIOACTIVE SEED IMPLANT/BRACHYTHERAPY IMPLANT;  Surgeon: [name], MD;  Location: [facility];  Service: Urology;  Laterality: N/A;   69     SEEDS IMPLANTED  . SPACE OAR INSTILLATION N/A 04/11/2018   Procedure: SPACE OAR INSTILLATION;  Surgeon: [name], MD;  Location: [facility];  Service: Urology;  Laterality: N/A;  . TOTAL KNEE ARTHROPLASTY Bilateral 09/13/2015   Procedure: BILATERAL KNEE ARTHROPLASTY ;  Surgeon: Paralee Cancel, MD;  Location: [facility];  Service: Orthopedics;  Laterality: Bilateral;   Social History   Occupational History  . Not on file  Tobacco Use  . Smoking status: Never Smoker  . Smokeless tobacco: Never Used  Vaping Use  . Vaping Use: Never used  Substance and Sexual Activity  . Alcohol use: No    Alcohol/week: 0.0 standard drinks  . Drug use: Not Currently  . Sexual activity: Yes

## 2019-10-31 ENCOUNTER — Other Ambulatory Visit: Payer: Self-pay | Admitting: Physician Assistant

## 2019-10-31 MED ORDER — METHOCARBAMOL 500 MG PO TABS: 500.0000 mg | ORAL_TABLET | Freq: Two times a day (BID) | ORAL | 0 refills | Status: DC | PRN

## 2019-10-31 NOTE — Telephone Encounter (Signed)
IC s/w patient and advised  

## 2019-10-31 NOTE — Telephone Encounter (Signed)
Looks like he had skelaxin before. I sent in robaxin, but hard to tell how much it will be.  Make sure he stops skelaxin if/when he starts robaxin.

## 2019-11-02 ENCOUNTER — Other Ambulatory Visit: Payer: Self-pay | Admitting: Podiatry

## 2019-11-02 ENCOUNTER — Other Ambulatory Visit: Payer: Self-pay | Admitting: Physician Assistant

## 2019-11-10 ENCOUNTER — Encounter: Payer: Medicare Other | Admitting: Podiatry

## 2019-11-10 DIAGNOSIS — M069 Rheumatoid arthritis, unspecified: Secondary | ICD-10-CM | POA: Diagnosis not present

## 2019-11-10 DIAGNOSIS — G894 Chronic pain syndrome: Secondary | ICD-10-CM | POA: Diagnosis not present

## 2019-11-10 DIAGNOSIS — S73199A Other sprain of unspecified hip, initial encounter: Secondary | ICD-10-CM | POA: Diagnosis not present

## 2019-11-10 DIAGNOSIS — M25529 Pain in unspecified elbow: Secondary | ICD-10-CM | POA: Diagnosis not present

## 2019-11-13 ENCOUNTER — Other Ambulatory Visit: Payer: Self-pay | Admitting: Family Medicine

## 2019-11-17 ENCOUNTER — Ambulatory Visit: Payer: Medicare Other | Admitting: Family Medicine

## 2019-11-17 ENCOUNTER — Encounter (HOSPITAL_COMMUNITY): Payer: Self-pay

## 2019-11-17 ENCOUNTER — Ambulatory Visit (HOSPITAL_COMMUNITY)
Admission: EM | Admit: 2019-11-17 | Discharge: 2019-11-17 | Discharge status: Against Medical Advice | Payer: Medicare Other

## 2019-11-17 ENCOUNTER — Other Ambulatory Visit: Payer: Self-pay

## 2019-11-17 NOTE — ED Notes (Signed)
Per Judie Bonus NP the patient needs further evaluation in the emergency room. Pt and wife states they are not going to the er and are going home. This RN educated the patient and his wife on the importance of a full work up in the emergency department due to the fall, exposure to heat, vital signs on ems arrival yesterday and confusion the patient is experiencing. Wife is tearful during conversation. States they are going home.

## 2019-11-17 NOTE — ED Triage Notes (Signed)
Pt presents with complaints of confusion and loss of hearing in his right ear. Reports that he fell yesterday while he was working outside. Reports that ems was called and was concerned for heat stroke. The patient refused to go to the ED. Based on EKG completed by paramedics patients heart rate was 147 at that time. Pt has wound on his right arm and right leg. Pt is oriented to place, self, situation, confused to year. Reports he remembers falling and hitting his head yesterday. Reports pain in his ear and left shoulder.

## 2019-11-18 ENCOUNTER — Emergency Department (HOSPITAL_COMMUNITY): Payer: Medicare Other

## 2019-11-18 ENCOUNTER — Emergency Department (HOSPITAL_COMMUNITY)
Admission: EM | Admit: 2019-11-18 | Discharge: 2019-11-18 | Discharge status: Against Medical Advice | Payer: Medicare Other | Attending: Emergency Medicine | Admitting: Emergency Medicine

## 2019-11-18 ENCOUNTER — Other Ambulatory Visit: Payer: Self-pay

## 2019-11-18 DIAGNOSIS — I483 Typical atrial flutter: Secondary | ICD-10-CM | POA: Insufficient documentation

## 2019-11-18 DIAGNOSIS — I4892 Unspecified atrial flutter: Secondary | ICD-10-CM | POA: Diagnosis not present

## 2019-11-18 DIAGNOSIS — I10 Essential (primary) hypertension: Secondary | ICD-10-CM | POA: Diagnosis not present

## 2019-11-18 DIAGNOSIS — Z8546 Personal history of malignant neoplasm of prostate: Secondary | ICD-10-CM | POA: Diagnosis not present

## 2019-11-18 DIAGNOSIS — Z79899 Other long term (current) drug therapy: Secondary | ICD-10-CM | POA: Diagnosis not present

## 2019-11-18 DIAGNOSIS — R4182 Altered mental status, unspecified: Secondary | ICD-10-CM | POA: Diagnosis not present

## 2019-11-18 LAB — CBC
HCT: 45.8 % (ref 39.0–52.0)
Hemoglobin: 14.5 g/dL (ref 13.0–17.0)
MCH: 27.2 pg (ref 26.0–34.0)
MCHC: 31.7 g/dL (ref 30.0–36.0)
MCV: 85.9 fL (ref 80.0–100.0)
Platelets: 237 10*3/uL (ref 150–400)
RBC: 5.33 MIL/uL (ref 4.22–5.81)
RDW: 16.5 % — ABNORMAL HIGH (ref 11.5–15.5)
WBC: 9.7 10*3/uL (ref 4.0–10.5)
nRBC: 0 % (ref 0.0–0.2)

## 2019-11-18 LAB — URINALYSIS, ROUTINE W REFLEX MICROSCOPIC
Bacteria, UA: NONE SEEN
Bilirubin Urine: NEGATIVE
Glucose, UA: NEGATIVE mg/dL
Hgb urine dipstick: NEGATIVE
Ketones, ur: 20 mg/dL — AB
Leukocytes,Ua: NEGATIVE
Nitrite: NEGATIVE
Protein, ur: 100 mg/dL — AB
Specific Gravity, Urine: 1.018 (ref 1.005–1.030)
pH: 6 (ref 5.0–8.0)

## 2019-11-18 LAB — CBG MONITORING, ED: Glucose-Capillary: 130 mg/dL — ABNORMAL HIGH (ref 70–99)

## 2019-11-18 LAB — LACTIC ACID, PLASMA: Lactic Acid, Venous: 1.6 mmol/L (ref 0.5–1.9)

## 2019-11-18 LAB — COMPREHENSIVE METABOLIC PANEL
ALT: 24 U/L (ref 0–44)
AST: 24 U/L (ref 15–41)
Albumin: 3.8 g/dL (ref 3.5–5.0)
Alkaline Phosphatase: 78 U/L (ref 38–126)
Anion gap: 11 (ref 5–15)
BUN: 16 mg/dL (ref 8–23)
CO2: 25 mmol/L (ref 22–32)
Calcium: 8.3 mg/dL — ABNORMAL LOW (ref 8.9–10.3)
Chloride: 100 mmol/L (ref 98–111)
Creatinine, Ser: 0.62 mg/dL (ref 0.61–1.24)
GFR calc Af Amer: >60 mL/min (ref >60)
GFR calc non Af Amer: >60 mL/min (ref >60)
Glucose, Bld: 121 mg/dL — ABNORMAL HIGH (ref 70–99)
Potassium: 3.8 mmol/L (ref 3.5–5.1)
Sodium: 136 mmol/L (ref 135–145)
Total Bilirubin: 1.8 mg/dL — ABNORMAL HIGH (ref 0.3–1.2)
Total Protein: 6.6 g/dL (ref 6.5–8.1)

## 2019-11-18 IMAGING — CT CT HEAD W/O CM
3 of 6 series · 12 of 47 positions shown, 14 images · non-contrast
Comparison: None.

CLINICAL DATA: Head trauma.  Fall.

EXAM:
CT HEAD WITHOUT CONTRAST
TECHNIQUE: Contiguous axial images were obtained from the base of the skull
through the vertex without intravenous contrast.

[Series 3: head wo · axial · 0.49mm/px · z∈[-46,+89]mm · 7 of 34 slices shown, 9 images]
[im 4/34  brain]
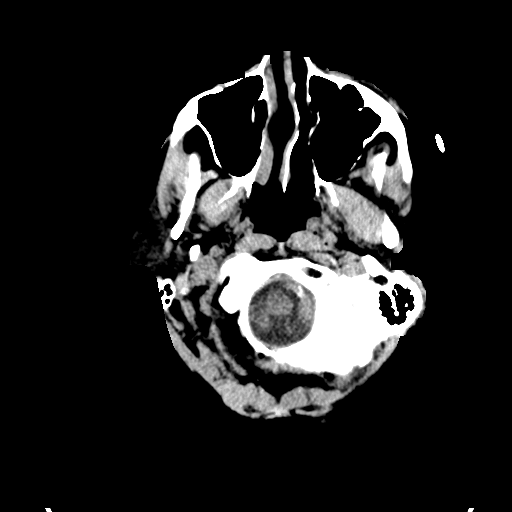
[im 4/34  bone]
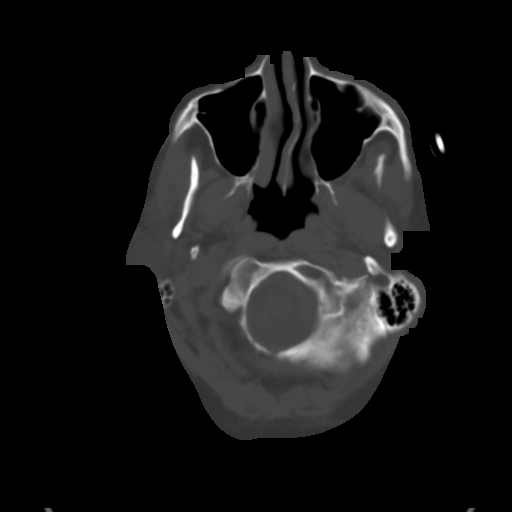
[im 8/34  brain]
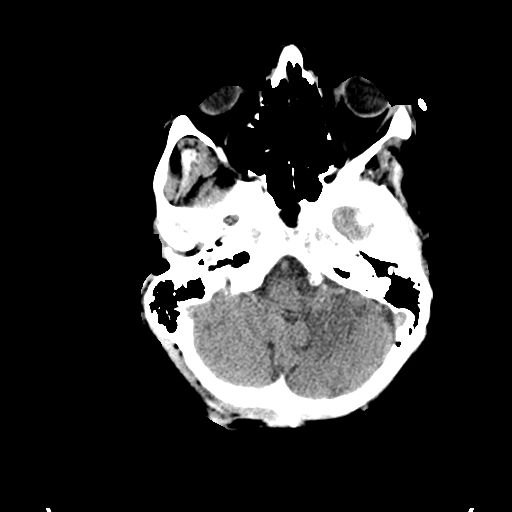
[im 13/34  brain]
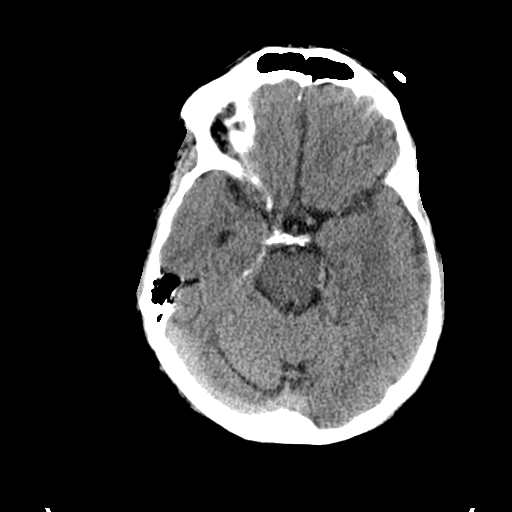
[im 17/34  brain]
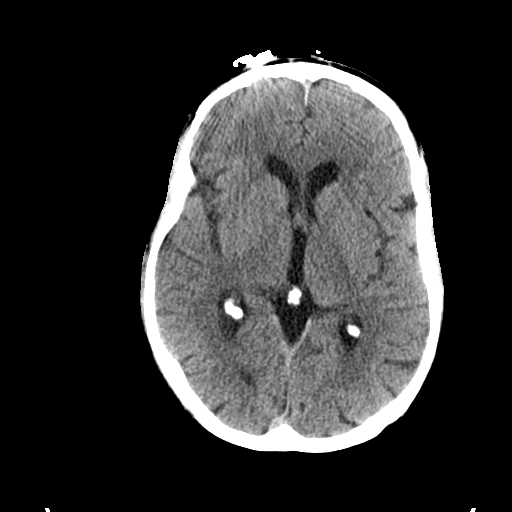
[im 22/34  brain]
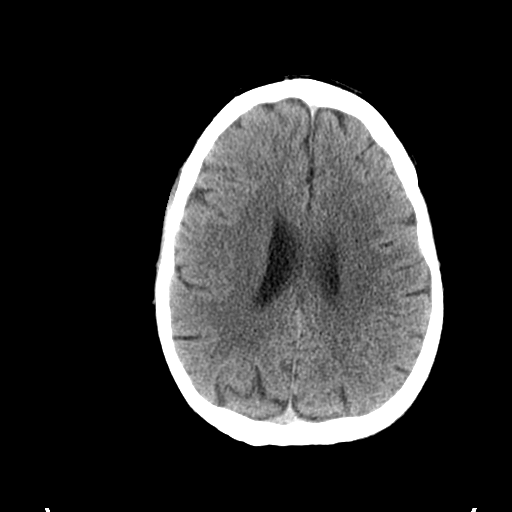
[im 22/34  bone]
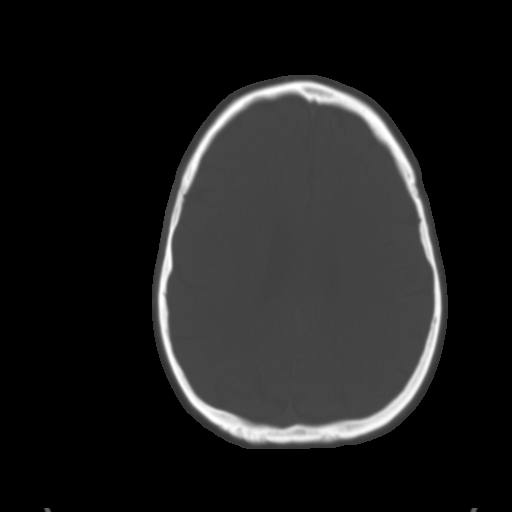
[im 26/34  brain]
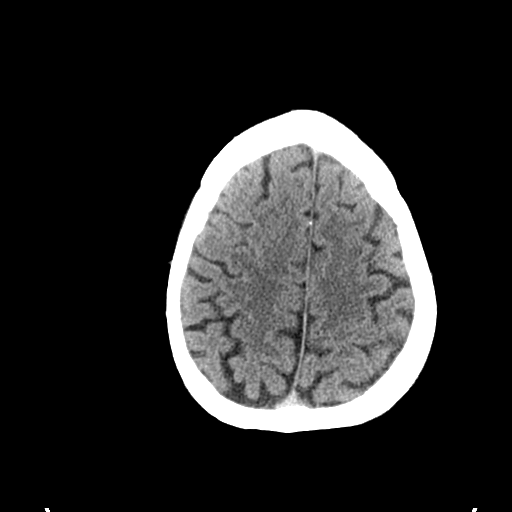
[im 31/34  brain]
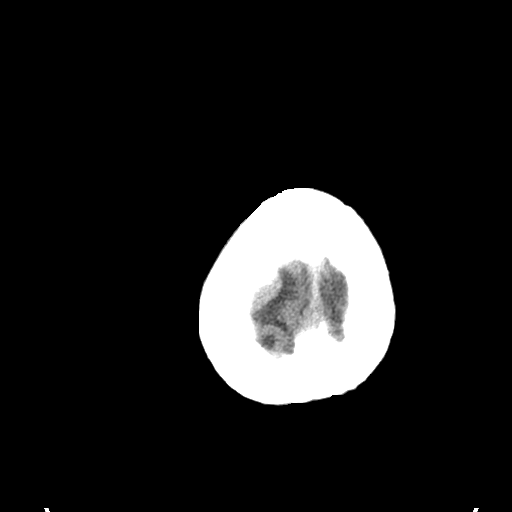

[Series 5: sagittal soft tissue · sagittal · 0.35mm/px · 2 of 58 slices shown]
[im 20/58  brain]
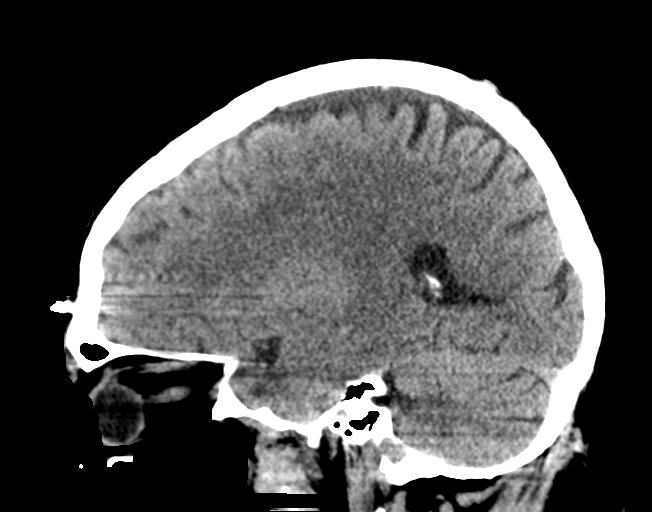
[im 39/58  brain]
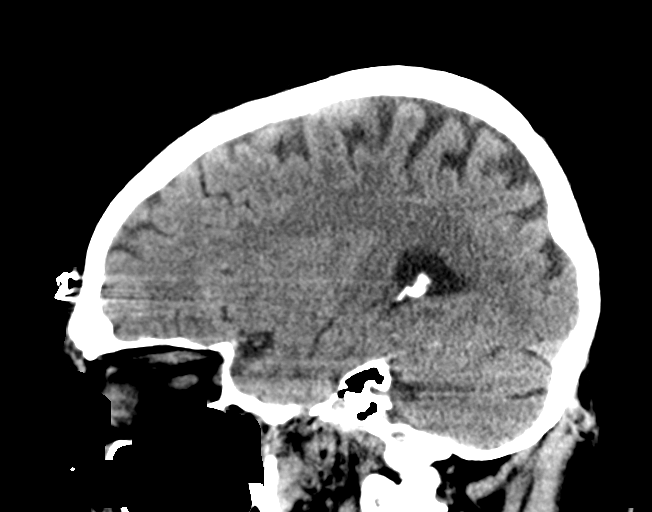

[Series 9: coronal soft tissue · coronal · 0.11mm/px · 3 of 76 slices shown]
[im 15/76  brain]
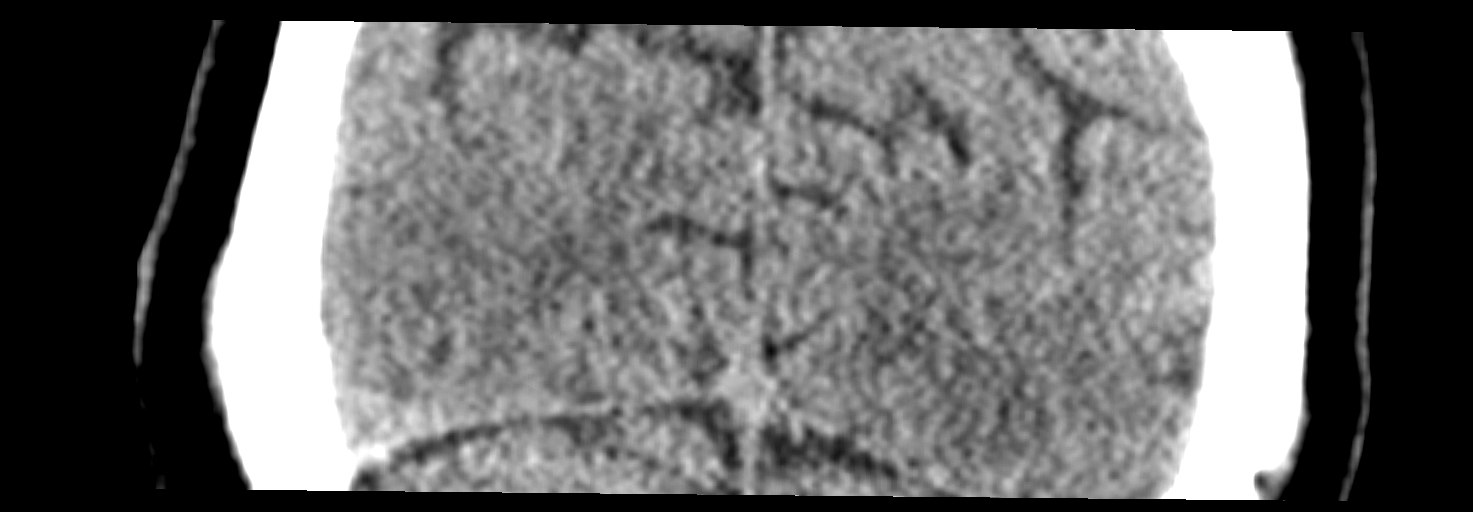
[im 30/76  brain]
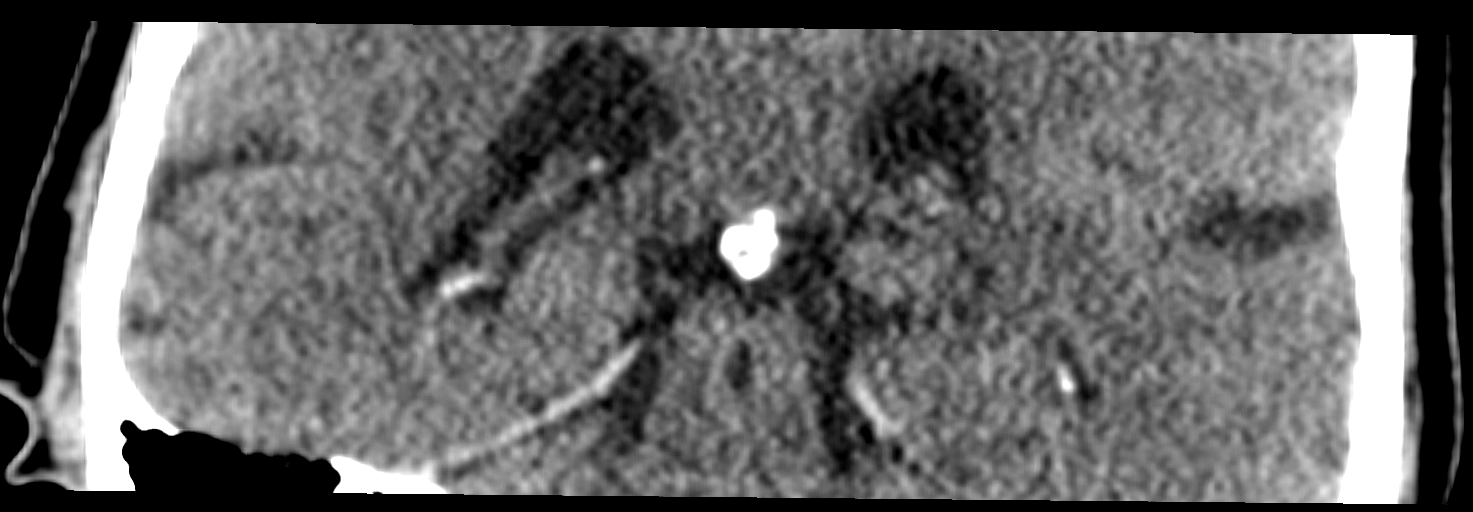
[im 45/76  brain]
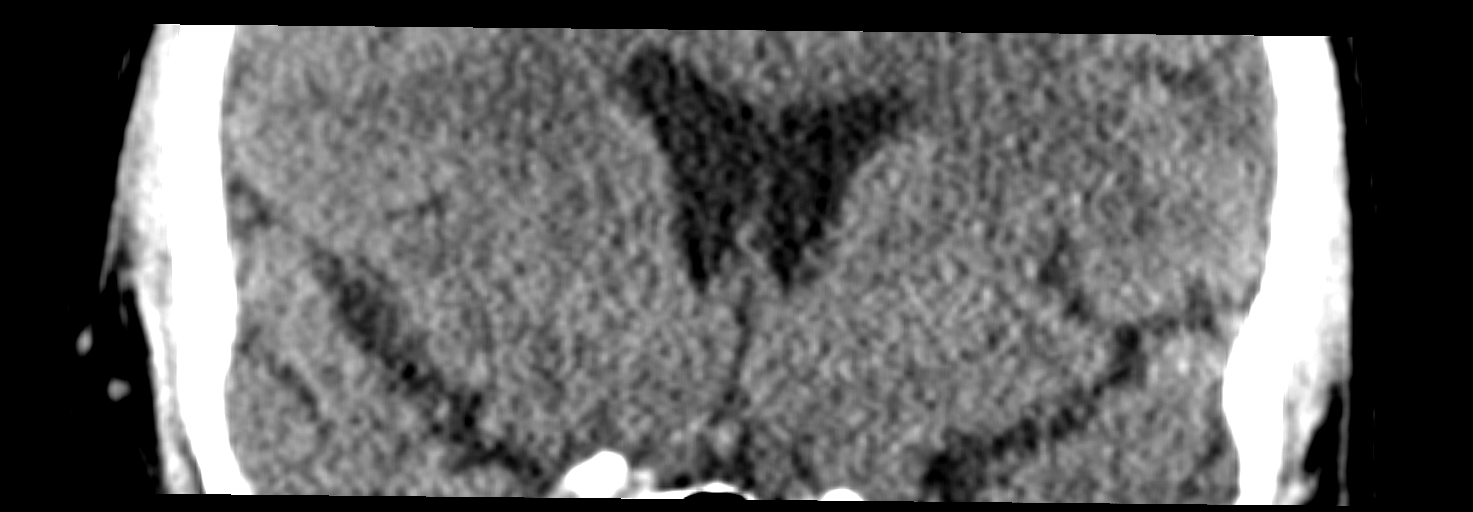

[12 of 47 positions shown; findings below may reference images not displayed]

FINDINGS: Brain: No acute infarct or intracranial hemorrhage. No mass lesion.
No midline shift, ventriculomegaly or extra-axial fluid collection.

Vascular: No hyperdense vessel. Bilateral carotid siphon
atherosclerotic calcifications.

Skull: Negative for fracture or focal lesion.

Sinuses/Orbits: Normal orbits. Minimal right maxillary sinus mucosal
thickening. No mastoid effusion.

Other: None.
IMPRESSION: No acute intracranial process.

## 2019-11-18 IMAGING — DX DG CHEST 1V PORT
1 series · 2 of 2 positions shown · non-contrast
Comparison: [DATE]

CLINICAL DATA: Tachycardia

EXAM:
PORTABLE CHEST 1 VIEW

[Series 1: chest ap · 0.14mm/px · 2 of 2 slices shown]
[im 1/2]
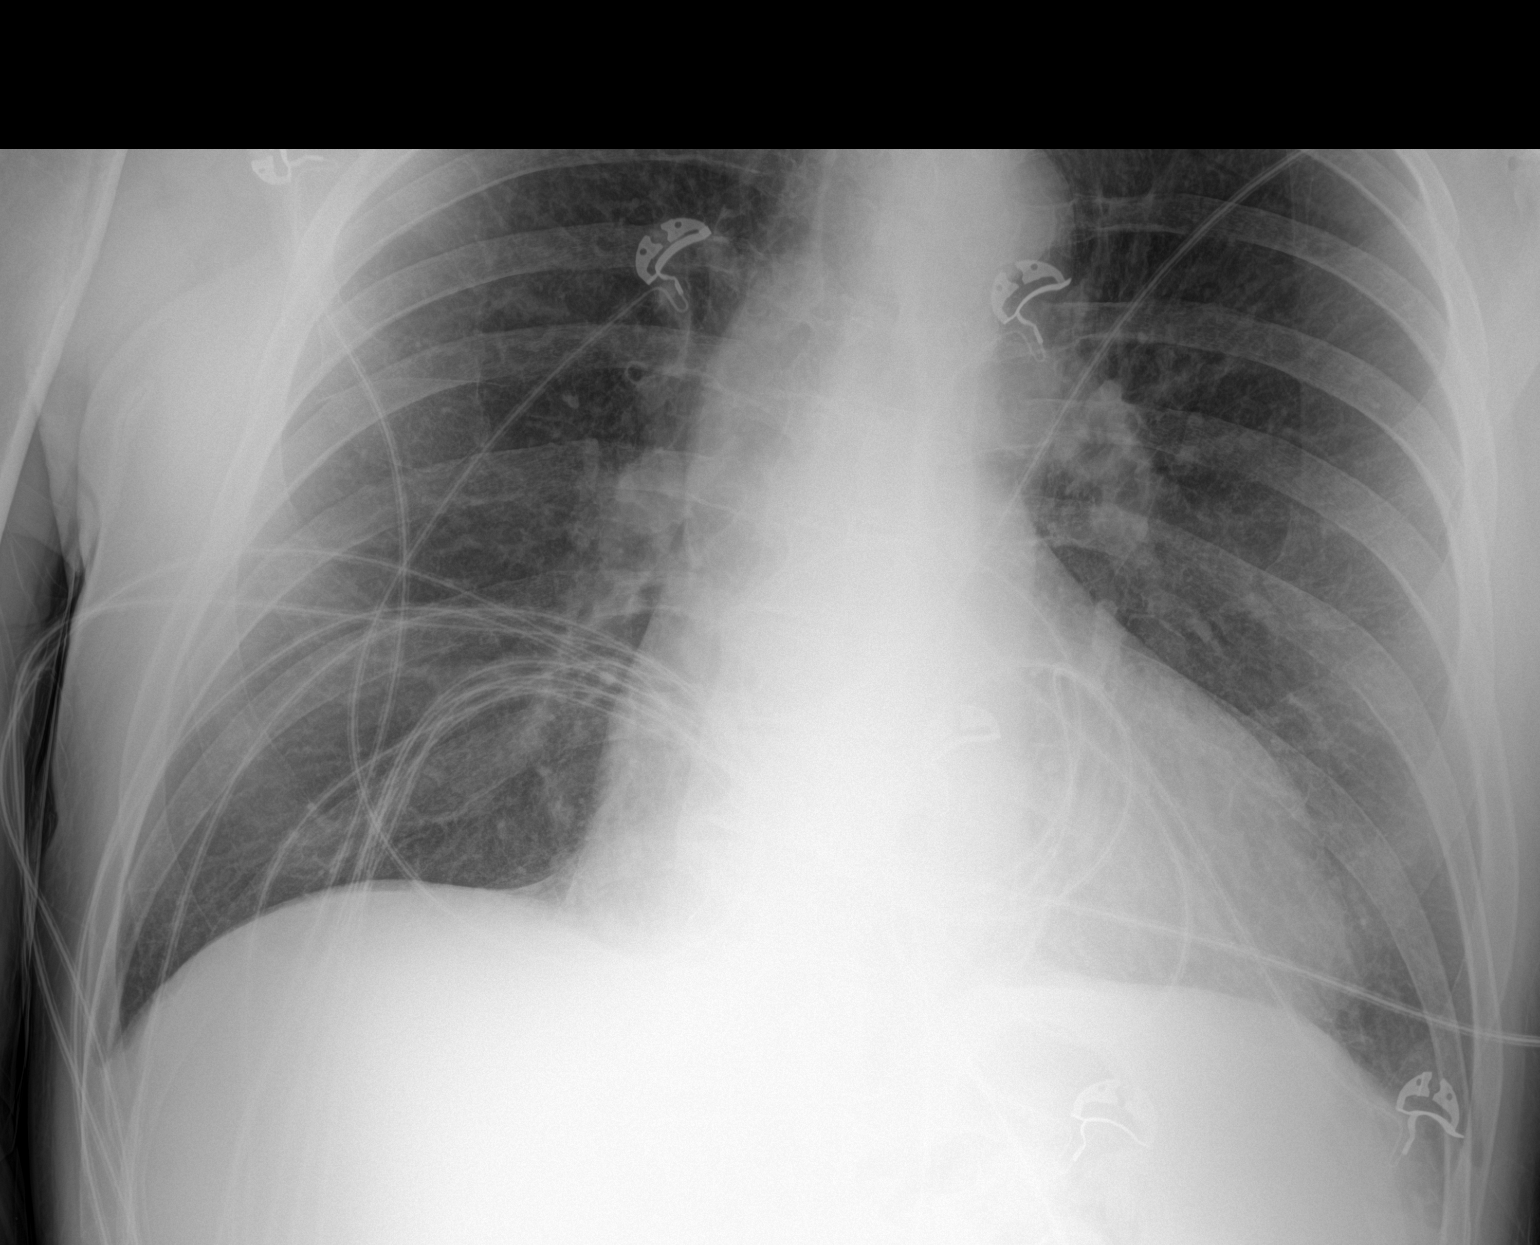
[im 2/2]
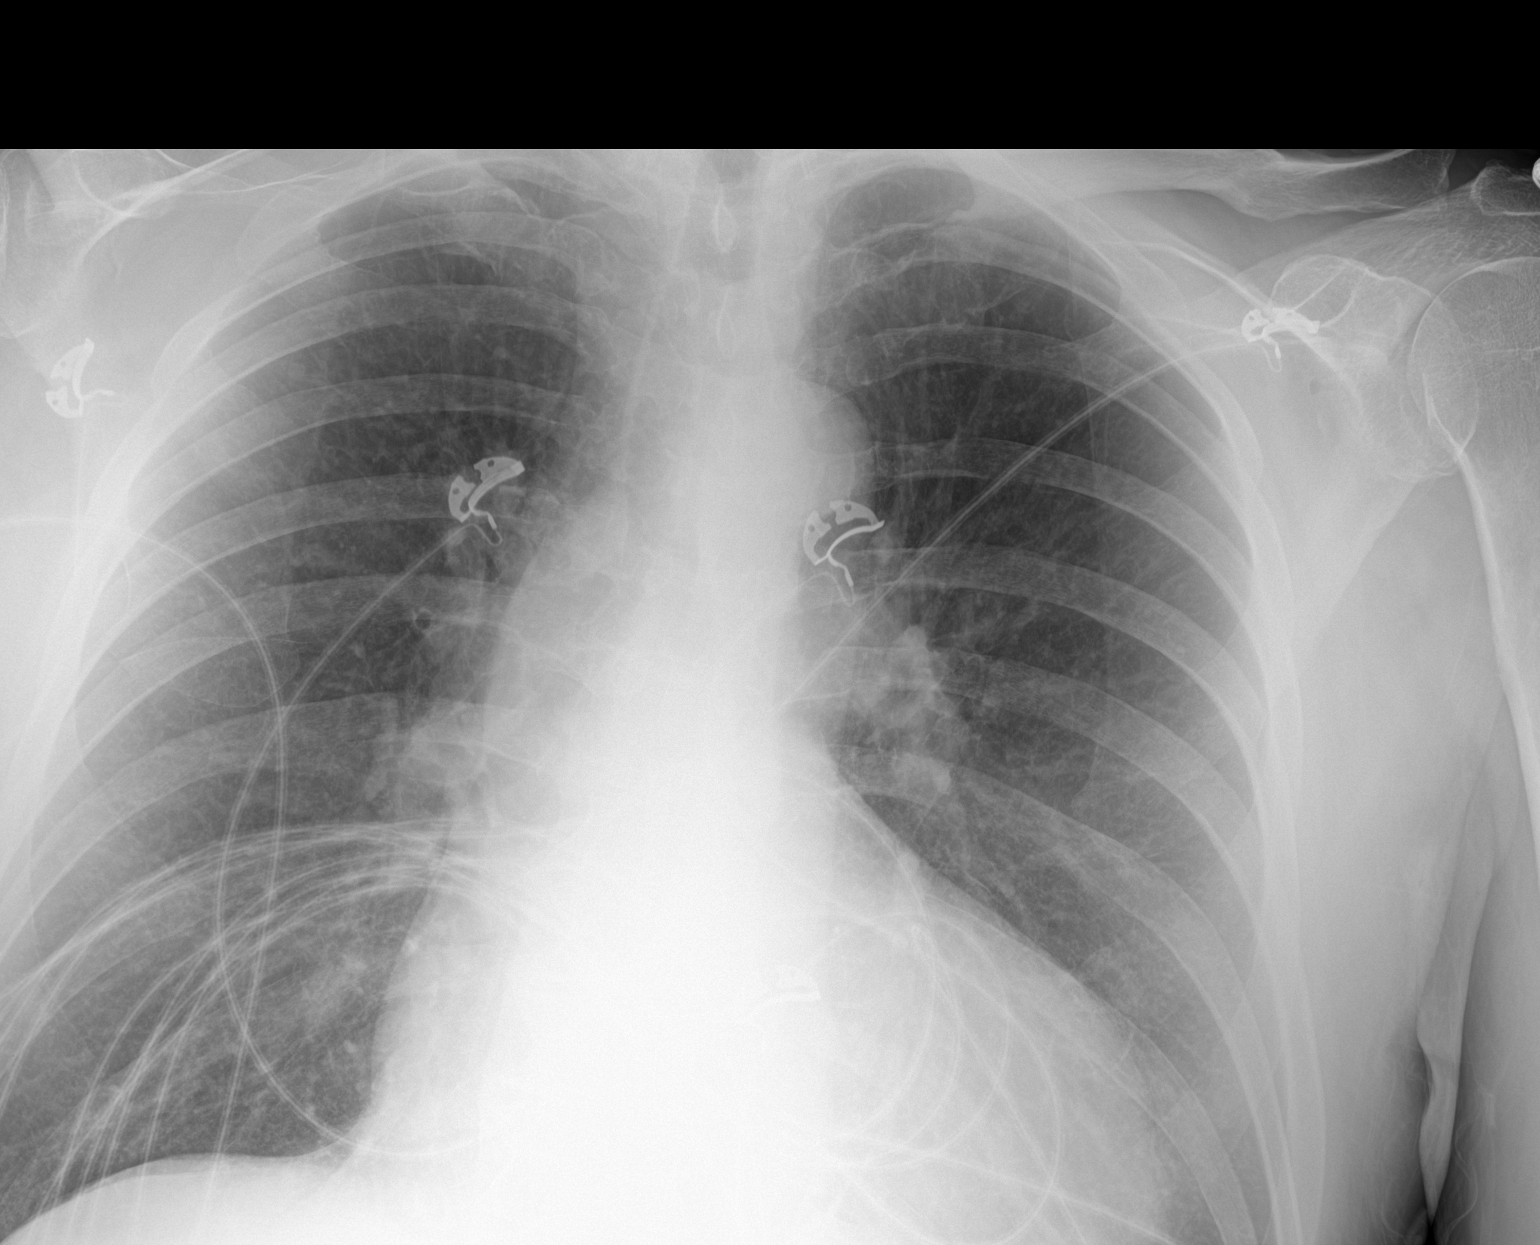

[2 of 2 positions shown; findings below may reference images not displayed]

FINDINGS: The cardiomediastinal silhouette is unchanged in contour when
accounting for differences in technique. No pleural effusion. No
pneumothorax. No acute pleuroparenchymal abnormality. Visualized
abdomen is unremarkable. No acute displaced rib fracture.
IMPRESSION: No acute cardiopulmonary abnormality.

## 2019-11-18 MED ORDER — SODIUM CHLORIDE 0.9% FLUSH
3.0000 mL | Freq: Once | INTRAVENOUS | Status: AC
  Administered 2019-11-18: 3 mL via INTRAVENOUS

## 2019-11-18 MED ORDER — LACTATED RINGERS IV BOLUS
1000.0000 mL | Freq: Once | INTRAVENOUS | Status: AC
  Administered 2019-11-18: 1000 mL via INTRAVENOUS

## 2019-11-18 NOTE — ED Triage Notes (Signed)
Patient reports he went to the urgent care yesterday. Patient had reported low BP. Patient was evaluated by EMS on the 25th and encouraged to be evaluated for heat stroke.

## 2019-11-18 NOTE — ED Notes (Signed)
Patient's wife reports that he fell on Sunday and hit head. Patient unsure if he passed out.

## 2019-11-18 NOTE — ED Provider Notes (Signed)
[facility] DEPT Provider Note   CSN: [id number] Arrival date & time: 11/18/19  1941     History No chief complaint on file.   Thomas [name] is a 69 y.o. male.  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  Wife reports he was fine until Sunday when he was outside working on building a wall when neighbors noticed him slumped over the wall and fell into a ditch with positive syncope.  EMS was called but patient refused transport.  Wife states since Sunday he has not been himself.  He has been sleeping constantly has seemed slightly disoriented and has been unsteady on his feet.  He did fall in the shower twice yesterday he reports he just felt slightly weak.  He has not been eating much and she reports he has not been drinking much fluid.  He has not had cough, congestion, abdominal pain, vomiting or diarrhea.  He denies headache and does not think he hit his head.  He denies any palpitations or chest pain.  His wife reports she is very worried about him because he is just not himself.  He did take his lisinopril yesterday and they went to urgent care who recommended he come to the emergency room but today was the earliest she could convince him to come.  Patient denies any drug or alcohol use.  He has not recently started any new medications.  Has had a prior history of tachycardia but no dysrhythmia that he is aware of.  The history is provided by the patient and the spouse.       Past Medical History:  Diagnosis Date  . Cancer [facility])    prostate  . Claustrophobia    quite severe  . Hypertension   . Hypoglycemia    occ  . Left knee DJD    Xray 12/23/08  . Prostate cancer [facility])     Patient Active Problem List   Diagnosis Date Noted  . Chronic right hip pain 06/19/2019  .  Degenerative tear of acetabular labrum of right hip 05/02/2019  . Primary osteoarthritis of right hip 05/02/2019  . Skin ulcer ([place]) 01/29/2019  . Malignant neoplasm of prostate ([place]) 01/16/2018  . Insomnia 10/02/2017  . Cyclic citrullinated peptide (CCP) antibody positive 05/31/2016  . S/P bilateral TKA 09/13/2015  . Other bilateral secondary osteoarthritis of knee 07/14/2014  . Heme positive stool 05/21/2013  . Osteoarthritis, multiple sites 01/13/2011  . Back pain 01/13/2011  . Hypertension 12/06/2010  . ED (erectile dysfunction) 08/25/2010  . CLAUSTROPHOBIA 03/01/2010    Past Surgical History:  Procedure Laterality Date  . CYSTOSCOPY  04/11/2018   Procedure: CYSTOSCOPY FLEXIBLE;  Surgeon: [name], MD;  Location[facility];  Service: Urology;;  NO SEEDS FOUND IN BLADDER  . HERNIA REPAIR  2009   inguinal  . JOINT REPLACEMENT Bilateral 2017   knees  . RADIOACTIVE SEED IMPLANT N/A 04/11/2018   Procedure: RADIOACTIVE SEED IMPLANT/BRACHYTHERAPY IMPLANT;  Surgeon: [name], MD;  Location: [facility];  Service: Urology;  Laterality: N/A;   71     SEEDS IMPLANTED  . SPACE OAR INSTILLATION N/A 04/11/2018   Procedure: SPACE OAR INSTILLATION;  Surgeon: [name], MD;  Location: [facility];  Service: Urology;  Laterality: N/A;  . TOTAL KNEE  ARTHROPLASTY Bilateral 09/13/2015   Procedure: BILATERAL KNEE ARTHROPLASTY ;  Surgeon: Paralee Cancel, MD;  Location: [facility];  Service: Orthopedics;  Laterality: Bilateral;       Family History  Problem Relation Age of Onset  . Prostate cancer Father   . Prostate cancer Brother   . Colon cancer Neg Hx   . Rectal cancer Neg Hx   . Stomach cancer Neg Hx     Social History   Tobacco Use  . Smoking status: Never Smoker  . Smokeless tobacco: Never Used  Vaping Use  . Vaping Use: Never used  Substance Use Topics  . Alcohol use: No    Alcohol/week: 0.0 standard  drinks  . Drug use: Not Currently    Home Medications Prior to Admission medications   Medication Sig Start Date End Date Taking? Authorizing Provider  lisinopril (ZESTRIL) 20 MG tablet TAKE 1 TABLET BY MOUTH EVERY DAY 11/13/19  Yes Chambliss, Jeb Levering, MD  methotrexate (RHEUMATREX) 2.5 MG tablet 8 TABLETS ONCE A WEEK ORALLY 28 DAYS 08/29/18  Yes [provider]  oxyCODONE-acetaminophen (PERCOCET) 10-325 MG tablet TAKE 1 TABLET BY MOUTH EVERY 4 TO 6 HOURS 02/23/18  Yes [provider]  tamsulosin (FLOMAX) 0.4 MG CAPS capsule Take 1 capsule (0.4 mg total) by mouth daily. 04/11/18  Yes [name], MD  diazepam (VALIUM) 10 MG tablet INSERT 1 TABLET RECTALLY PRIOR TO BEDTIME 06/27/19   [provider]  doxycycline (VIBRA-TABS) 100 MG tablet TAKE 1 TABLET BY MOUTH TWICE A DAY 07/18/19   Persons, Bevely Palmer, PA  doxycycline (VIBRA-TABS) 100 MG tablet TAKE 1 TABLET BY MOUTH TWICE A DAY 11/03/19   Felipa Furnace, DPM  doxycycline (VIBRAMYCIN) 100 MG capsule TAKE 1 CAPSULE TWICE A DAY FOR 21 DAYS 11/03/19   Felipa Furnace, DPM  ENBREL SURECLICK 50 MG/ML injection Inject into the skin once a week. 07/07/19   [provider]  folic acid (FOLVITE) 1 MG tablet Take 1 mg by mouth daily. 08/06/18   [provider]  hydrOXYzine (ATARAX/VISTARIL) 25 MG tablet Take 0.5-1 tablets (12.5-25 mg total) by mouth every 8 (eight) hours as needed for itching. 03/11/19   Jaynee Eagles, PA-C  meloxicam (MOBIC) 15 MG tablet TAKE 1 TABLET BY MOUTH EVERY DAY 07/23/19   [name], MD  metaxalone (SKELAXIN) 800 MG tablet Take 1 tablet (800 mg total) by mouth 3 (three) times daily. 10/30/19   Leandrew Koyanagi, MD  methocarbamol (ROBAXIN) 500 MG tablet Take 1 tablet (500 mg total) by mouth 2 (two) times daily as needed. 10/31/19   Aundra Dubin, PA-C  methylPREDNISolone (MEDROL DOSEPAK) 4 MG TBPK tablet Use as directed 10/30/19   Leandrew Koyanagi, MD  Multiple Vitamin (MULTIVITAMIN WITH  MINERALS) TABS tablet Take 1 tablet by mouth daily.    [provider]  NARCAN 4 MG/0.1ML LIQD nasal spray kit USE AS DIRECTED AS NEEDED 11/01/18   [provider]  predniSONE (DELTASONE) 10 MG tablet Take 10 mg by mouth daily with breakfast.    [provider]  predniSONE (STERAPRED UNI-PAK 21 TAB) 10 MG (21) TBPK tablet Take as directed 08/08/19   Leandrew Koyanagi, MD  testosterone enanthate (DELATESTRYL) 200 MG/ML injection 0.5 ML IM Q1WK 12/31/18   [provider]    Allergies    Chlorthalidone  Review of Systems   Review of Systems  All other systems reviewed and are negative.   Physical Exam Updated Vital Signs BP Marland Kitchen)  125/91 (BP Location: Left Arm)   Pulse 72   Temp 99.1 F (37.3 C) (Oral)   Resp 16   SpO2 100%   Physical Exam Vitals and nursing note reviewed.  Constitutional:      General: He is not in acute distress.    Appearance: Normal appearance. He is well-developed and normal weight.  HENT:     Head: Normocephalic and atraumatic.     Mouth/Throat:     Mouth: Mucous membranes are dry.  Eyes:     General: No visual field deficit.    Conjunctiva/sclera: Conjunctivae normal.     Pupils: Pupils are equal, round, and reactive to light.  Cardiovascular:     Rate and Rhythm: Regular rhythm. Tachycardia present.     Pulses: Normal pulses.     Heart sounds: No murmur heard.   Pulmonary:     Effort: Pulmonary effort is normal. No respiratory distress.     Breath sounds: Normal breath sounds. No wheezing or rales.  Abdominal:     General: There is no distension.     Palpations: Abdomen is soft.     Tenderness: There is no abdominal tenderness. There is no guarding or rebound.  Musculoskeletal:        General: No tenderness. Normal range of motion.     Cervical back: Normal range of motion and neck supple.     Comments: Deformities noted of the hands consistent with RA  Skin:    General: Skin is warm and dry.     Findings: No  erythema or rash.     Comments: Small abrasions and skin tears over the arms bilaterally in various stages of healing  Neurological:     Mental Status: He is alert and oriented to person, place, and time.     Cranial Nerves: No cranial nerve deficit, dysarthria or facial asymmetry.     Sensory: Sensation is intact.     Motor: Motor function is intact. No abnormal muscle tone or pronator drift.     Coordination: Coordination is intact. Finger-Nose-Finger Test and Heel to [place] Test normal.     Comments: Mild cognitive difficulty recalling events.  Was able to walk to the bathroom here without ataxia  Psychiatric:        Behavior: Behavior normal.     ED Results / Procedures / Treatments   Labs (all labs ordered are listed, but only abnormal results are displayed) Labs Reviewed  CBC - Abnormal; Notable for the following components:      Result Value   RDW 16.5 (*)    All other components within normal limits  URINALYSIS, ROUTINE W REFLEX MICROSCOPIC - Abnormal; Notable for the following components:   Ketones, ur 20 (*)    Protein, ur 100 (*)    All other components within normal limits  COMPREHENSIVE METABOLIC PANEL - Abnormal; Notable for the following components:   Glucose, Bld 121 (*)    Calcium 8.3 (*)    Total Bilirubin 1.8 (*)    All other components within normal limits  CBG MONITORING, ED - Abnormal; Notable for the following components:   Glucose-Capillary 130 (*)    All other components within normal limits  LACTIC ACID, PLASMA    EKG EKG Interpretation  Date/Time:  Tuesday November 18 2019 10:00:11 EDT Ventricular Rate:  144 PR Interval:    QRS Duration: 74 QT Interval:  234 QTC Calculation: 362 R Axis:   52 Text Interpretation: new Atrial flutter with 2:1 A-V conduction  Pulmonary disease pattern Septal infarct , age undetermined Confirmed by Blanchie Dessert [phone]) on 11/18/2019 10:41:15 AM   Radiology CT HEAD WO CONTRAST  Result Date: 11/18/2019 CLINICAL  DATA:  Head trauma.  Fall. EXAM: CT HEAD WITHOUT CONTRAST TECHNIQUE: Contiguous axial images were obtained from the base of the skull through the vertex without intravenous contrast. COMPARISON:  None. FINDINGS: Brain: No acute infarct or intracranial hemorrhage. No mass lesion. No midline shift, ventriculomegaly or extra-axial fluid collection. Vascular: No hyperdense vessel. Bilateral carotid siphon atherosclerotic calcifications. Skull: Negative for fracture or focal lesion. Sinuses/Orbits: Normal orbits. Minimal right maxillary sinus mucosal thickening. No mastoid effusion. Other: None. IMPRESSION: No acute intracranial process. Electronically Signed   By: Primitivo Gauze M.D.   On: 11/18/2019 11:45   DG Chest Port 1 View  Result Date: 11/18/2019 CLINICAL DATA:  Tachycardia EXAM: PORTABLE CHEST 1 VIEW COMPARISON:  September 29, 2018 FINDINGS: The cardiomediastinal silhouette is unchanged in contour when accounting for differences in technique. No pleural effusion. No pneumothorax. No acute pleuroparenchymal abnormality. Visualized abdomen is unremarkable. No acute displaced rib fracture. IMPRESSION: No acute cardiopulmonary abnormality. Electronically Signed   By: Valentino Saxon MD   On: 11/18/2019 11:12    Procedures Procedures (including critical care time)  Medications Ordered in ED Medications  sodium chloride flush (NS) 0.9 % injection 3 mL (has no administration in time range)  lactated ringers bolus 1,000 mL (has no administration in time range)    ED Course  I have reviewed the triage vital signs and the nursing notes.  Pertinent labs & imaging results that were available during my care of the patient were reviewed by me and considered in my medical decision making (see chart for details).    MDM Rules/Calculators/A&P                          Patient presenting today with altered mental status and syncopal episode outside at work on Sunday.  He has had ongoing abnormalities as  wife describes as being confused, difficulty walking and several additional falls.  Patient does not abuse substances and low suspicion is that his the cause.  Concern for possible heatstroke, dehydration, AKI.  Patient is also in atrial flutter with a 2-1 rate today which is unclear if this is a primary or secondary cause.  Concern for date dehydration at this time.  He denies any chest pain or shortness of breath at this time.  Exam is nonfocal without localized neurologic issues.  Will give IV fluids and reassess heart rate.  Labs pending.  1:02 PM Pt labs are reassuring with normal Hb, Cr and lactate.  uA with ketone but no other issues.  Pt despite fluids has maintained HR in the 140's in aflutter.  Pt will need medication for rate control.  Head CT and CXR wihtout acute findings.   However when going to discuss results and plan with pt and his wife he was gone.  Nurse informed me pt left AMA.  I was unable to speak with pt or his wife. MDM Number of Diagnoses or Management Options   Amount and/or Complexity of Data Reviewed Clinical lab tests: ordered and reviewed Tests in the radiology section of CPT: reviewed and ordered Tests in the medicine section of CPT: ordered and reviewed Decide to obtain previous medical records or to obtain history from someone other than the patient: yes Obtain history from someone other than the patient: yes Review and summarize  past medical records: yes Independent visualization of images, tracings, or specimens: yes  Risk of Complications, Morbidity, and/or Mortality Presenting problems: high Diagnostic procedures: moderate Management options: moderate  Patient Progress Patient progress: stable    Final Clinical Impression(s) / ED Diagnoses Final diagnoses:  Atrial flutter with rapid ventricular response ([place])  Altered mental status, unspecified altered mental status type    Rx / DC Orders ED Discharge Orders    None       Blanchie Dessert, MD 11/18/19 1304

## 2019-11-19 ENCOUNTER — Encounter: Payer: Medicare Other | Admitting: Podiatry

## 2019-11-24 ENCOUNTER — Encounter: Payer: Self-pay | Admitting: Family Medicine

## 2019-11-24 ENCOUNTER — Other Ambulatory Visit: Payer: Self-pay

## 2019-11-24 ENCOUNTER — Telehealth: Payer: Self-pay | Admitting: Family Medicine

## 2019-11-24 ENCOUNTER — Ambulatory Visit: Payer: Medicare Other | Admitting: Family Medicine

## 2019-11-24 DIAGNOSIS — C61 Malignant neoplasm of prostate: Secondary | ICD-10-CM

## 2019-11-24 DIAGNOSIS — R410 Disorientation, unspecified: Secondary | ICD-10-CM

## 2019-11-24 DIAGNOSIS — R634 Abnormal weight loss: Secondary | ICD-10-CM | POA: Insufficient documentation

## 2019-11-24 HISTORY — DX: Abnormal weight loss: R63.4

## 2019-11-24 MED ORDER — LORAZEPAM 0.5 MG PO TABS: ORAL_TABLET | ORAL | 0 refills | Status: DC

## 2019-11-24 NOTE — Telephone Encounter (Signed)
Patients wife is calling very concerned about husband, I offered her Doctors next available on the 11th but wife denied and said he need to be seen now that he is stumbling, can't hear, and some other issues, wife says they have already been to ED and urgent care and they aren't doing anything to help. I also offered a different doctor sooner and she denied. Is wanting to know if doctor can squeeze him in ASAP. Patients wife would like a call. [phone]. Thanks

## 2019-11-24 NOTE — Patient Instructions (Signed)
I am worried about the neurologic symptoms and weight loss.  I do not know what the diagnosis is.  I will do my best to find out. I have ordered a bunch of blood tests, a brain MRI and and ultrasound of your liver.   A part of the problem may be you being off the embril and on the valium/other meds.  Time will tell.   I will call with results. It would best for you to make an appointment with Dr Erin Hearing on the way out. Tetanus shot today.

## 2019-11-24 NOTE — Progress Notes (Signed)
Called me wife and suggested make an appointment with our same day clinic as I am not in office until next week.   She agrees

## 2019-11-25 ENCOUNTER — Encounter: Payer: Self-pay | Admitting: Family Medicine

## 2019-11-25 LAB — VITAMIN B12: Vitamin B-12: 483 pg/mL (ref 232–1245)

## 2019-11-25 LAB — TSH: TSH: 0.918 u[IU]/mL (ref 0.450–4.500)

## 2019-11-25 LAB — RPR: RPR Ser Ql: NONREACTIVE

## 2019-11-25 LAB — PSA: Prostate Specific Ag, Serum: 0.3 ng/mL (ref 0.0–4.0)

## 2019-11-25 LAB — SEDIMENTATION RATE: Sed Rate: 4 mm/hr (ref 0–30)

## 2019-11-25 NOTE — Assessment & Plan Note (Signed)
Challenging diff dx in this immunocompromised patient with personal history of prostate cancer. Majority of sx point to CNS.  Will get MRI.  No more valium.  Carefully limit narcotics.  Also check B12, RPR Bili points to possible liver (e.g. liver mets)  Recent CMP otherwise reassuring.  Start with RUQ ultrasound Occult infection - immunocompromised.  Currently on doxy.  No antibiotics.  Check sed rate. Cancer - Check PSA and sed rate.  All labs reassuring.  Await MRI and RUQ ultrasound.  Prescription drug misuse is becoming the more likely dx.

## 2019-11-25 NOTE — Telephone Encounter (Signed)
Called wife and informed of normal/reassuring lab tests.

## 2019-11-25 NOTE — Progress Notes (Signed)
    SUBJECTIVE:   CHIEF COMPLAINT / HPI:   Weight loss, falls and confusion. Patient was normal up until one month ago.  He has considerable chronic problems, most important of which are: 1. Chronic foot ulcer present x 1 year.  Currently on doxycycline by podiatry with planned surgery later this week. 2. Immunocompromised 2nd to chronic embrel and prednisone treatment.  Notably, embrel was held one month ago which has a strong temporal relationship with the onset of symptoms. Patient has had poor appetite, worsening confusion, and generalized weakness, worsening over the last month.  He has had a documented 36 lb weight loss since 10/30/19  Symptoms are mostlyCNS with poor balance, word finding difficulty, sleeping a lot, and confusion.  Frequent falls.  Has general weakness but not any focal weakness.  Denies fever.  Other symptoms include, 1. Stable but significant "hole" in left foot which podiatry has been managing 2. Multiple scrapes from falls.  One on right leg is a bit red.   He has been to urgent care and ER.  He eloped from the ER but not before having a normal head CT and reassuring CBC and CMP.  Bili was slightly elevated.  Of note, he has likely been taking increased narcotics since he has been off embrel.  Also, some provider gave him valium to sleep at night and he likely took more than prescribed per wife.  Note that wife is now handling medications.    Hx of prostate ca.  Gold seeds ~3 years ago Never colonoscopy  PHQ9 is positive.  Neither wife nor patient characterize him as depressed.  Their focus is on confusion.   OBJECTIVE:   BP (!) 108/92   Pulse 67   Ht 6\' 8"  (2.032 m)   Wt 204 lb (92.5 kg)   SpO2 97%   BMI 22.41 kg/m   Neck supple  Lungs clear Cardiac RRR without m or g abd benign Ext, mild redness around abrasion of right shin Neuro: Gait mildly unsteady.  Cognition is mildly impaired (slow to answer and defers to wife.)  ASSESSMENT/PLAN:    Weight loss, non-intentional Challenging diff dx in this immunocompromised patient with personal history of prostate cancer. Majority of sx point to CNS.  Will get MRI.  No more valium.  Carefully limit narcotics.  Also check B12, RPR Bili points to possible liver (e.g. liver mets)  Recent CMP otherwise reassuring.  Start with RUQ ultrasound Occult infection - immunocompromised.  Currently on doxy.  No antibiotics.  Check sed rate. Cancer - Check PSA and sed rate.  All labs reassuring.  Await MRI and RUQ ultrasound.  Prescription drug misuse is becoming the more likely dx.    Confusion Please see wt loss unintentional for my diff dx and plan.     Zenia Resides, MD [place]

## 2019-11-25 NOTE — Assessment & Plan Note (Signed)
Please see wt loss unintentional for my diff dx and plan.

## 2019-11-26 ENCOUNTER — Other Ambulatory Visit: Payer: Self-pay

## 2019-11-26 ENCOUNTER — Emergency Department (HOSPITAL_BASED_OUTPATIENT_CLINIC_OR_DEPARTMENT_OTHER)
Admission: EM | Admit: 2019-11-26 | Discharge: 2019-11-26 | Disposition: A | Discharge status: Home / Self Care | Payer: Medicare Other | Attending: Emergency Medicine | Admitting: Emergency Medicine

## 2019-11-26 ENCOUNTER — Encounter (HOSPITAL_BASED_OUTPATIENT_CLINIC_OR_DEPARTMENT_OTHER): Payer: Self-pay | Admitting: *Deleted

## 2019-11-26 DIAGNOSIS — I10 Essential (primary) hypertension: Secondary | ICD-10-CM | POA: Diagnosis not present

## 2019-11-26 DIAGNOSIS — Z79899 Other long term (current) drug therapy: Secondary | ICD-10-CM | POA: Insufficient documentation

## 2019-11-26 DIAGNOSIS — I4891 Unspecified atrial fibrillation: Secondary | ICD-10-CM | POA: Insufficient documentation

## 2019-11-26 DIAGNOSIS — I4892 Unspecified atrial flutter: Secondary | ICD-10-CM

## 2019-11-26 DIAGNOSIS — Z8546 Personal history of malignant neoplasm of prostate: Secondary | ICD-10-CM | POA: Diagnosis not present

## 2019-11-26 LAB — COMPREHENSIVE METABOLIC PANEL
ALT: 20 U/L (ref 0–44)
AST: 23 U/L (ref 15–41)
Albumin: 3.6 g/dL (ref 3.5–5.0)
Alkaline Phosphatase: 96 U/L (ref 38–126)
Anion gap: 12 (ref 5–15)
BUN: 26 mg/dL — ABNORMAL HIGH (ref 8–23)
CO2: 27 mmol/L (ref 22–32)
Calcium: 8.8 mg/dL — ABNORMAL LOW (ref 8.9–10.3)
Chloride: 102 mmol/L (ref 98–111)
Creatinine, Ser: 0.77 mg/dL (ref 0.61–1.24)
GFR calc Af Amer: >60 mL/min (ref >60)
GFR calc non Af Amer: >60 mL/min (ref >60)
Glucose, Bld: 96 mg/dL (ref 70–99)
Potassium: 4 mmol/L (ref 3.5–5.1)
Sodium: 141 mmol/L (ref 135–145)
Total Bilirubin: 0.7 mg/dL (ref 0.3–1.2)
Total Protein: 6.6 g/dL (ref 6.5–8.1)

## 2019-11-26 LAB — CBC WITH DIFFERENTIAL/PLATELET
Abs Immature Granulocytes: 0.02 10*3/uL (ref 0.00–0.07)
Basophils Absolute: 0 10*3/uL (ref 0.0–0.1)
Basophils Relative: 1 %
Eosinophils Absolute: 0.1 10*3/uL (ref 0.0–0.5)
Eosinophils Relative: 1 %
HCT: 45.5 % (ref 39.0–52.0)
Hemoglobin: 14 g/dL (ref 13.0–17.0)
Immature Granulocytes: 0 %
Lymphocytes Relative: 19 %
Lymphs Abs: 1.2 10*3/uL (ref 0.7–4.0)
MCH: 26.4 pg (ref 26.0–34.0)
MCHC: 30.8 g/dL (ref 30.0–36.0)
MCV: 85.8 fL (ref 80.0–100.0)
Monocytes Absolute: 0.5 10*3/uL (ref 0.1–1.0)
Monocytes Relative: 8 %
Neutro Abs: 4.3 10*3/uL (ref 1.7–7.7)
Neutrophils Relative %: 71 %
Platelets: 325 10*3/uL (ref 150–400)
RBC: 5.3 MIL/uL (ref 4.22–5.81)
RDW: 15.6 % — ABNORMAL HIGH (ref 11.5–15.5)
WBC: 6.1 10*3/uL (ref 4.0–10.5)
nRBC: 0 % (ref 0.0–0.2)

## 2019-11-26 LAB — MAGNESIUM: Magnesium: 1.9 mg/dL (ref 1.7–2.4)

## 2019-11-26 LAB — TSH: TSH: 0.816 u[IU]/mL (ref 0.350–4.500)

## 2019-11-26 MED ORDER — DILTIAZEM HCL-DEXTROSE 125-5 MG/125ML-% IV SOLN (PREMIX)
5.0000 mg/h | INTRAVENOUS | Status: DC
  Administered 2019-11-26: 5 mg/h via INTRAVENOUS
  Filled 2019-11-26: qty 125

## 2019-11-26 MED ORDER — OXYCODONE-ACETAMINOPHEN 10-325 MG PO TABS: 1.0000 | ORAL_TABLET | Freq: Four times a day (QID) | ORAL | 0 refills | Status: AC | PRN

## 2019-11-26 MED ORDER — DILTIAZEM LOAD VIA INFUSION
20.0000 mg | Freq: Once | INTRAVENOUS | Status: AC
  Administered 2019-11-26: 20 mg via INTRAVENOUS
  Filled 2019-11-26: qty 20

## 2019-11-26 MED ORDER — IBUPROFEN 800 MG PO TABS
800.0000 mg | ORAL_TABLET | Freq: Four times a day (QID) | ORAL | 1 refills | Status: DC | PRN
Start: 2019-11-26 — End: 2020-02-12

## 2019-11-26 MED ORDER — DILTIAZEM HCL ER COATED BEADS 120 MG PO CP24: 120.0000 mg | ORAL_CAPSULE | Freq: Every day | ORAL | 0 refills | Status: DC

## 2019-11-26 MED FILL — CARTIA XT 120 MG CP24: 120 | 15 days supply | Qty: 15 | Fill #0

## 2019-11-26 NOTE — ED Provider Notes (Addendum)
[facility] EMERGENCY DEPARTMENT Provider Note   CSN: [id number] Arrival date & time: 11/26/19  1429     History Chief Complaint  Patient presents with  . Atrial Flutter    Thomas [name] is a 69 y.o. male with past medical history of hypertension, prostate cancer, presenting to the emergency department with complaint of fast heart rate.  He was sent by surgical clinic, he went in today for procedure on his foot.  During his initial assessment prior to procedure, they noted his heart rate was quite fast.  He was sent here for further evaluation.  He states he is aware of this, he was evaluated on 11/18/2019 for AMS and a fall, was also found to be in a flutter with 2.1 block and RVR. He had eloped from the ED prior to completion of work up. He denies any associate symptoms, including no palpitations, chest pain, shortness of breath, fatigue or weakness.  Patient does not take blood thinners.  He is followed by Dr. Hermelinda Medicus with cardiology.  The history is provided by medical records and the patient.       Past Medical History:  Diagnosis Date  . Cancer [facility])    prostate  . Claustrophobia    quite severe  . Hypertension   . Hypoglycemia    occ  . Left knee DJD    Xray 12/23/08  . Prostate cancer [facility])     Patient Active Problem List   Diagnosis Date Noted  . Weight loss, non-intentional 11/24/2019  . Confusion 11/24/2019  . Hyperbilirubinemia 11/24/2019  . Chronic right hip pain 06/19/2019  . Degenerative tear of acetabular labrum of right hip 05/02/2019  . Primary osteoarthritis of right hip 05/02/2019  . Skin ulcer ([place]) 01/29/2019  . Malignant neoplasm of prostate ([place]) 01/16/2018  . Insomnia 10/02/2017  . Cyclic citrullinated peptide (CCP) antibody positive 05/31/2016  . S/P bilateral TKA 09/13/2015  . Other bilateral secondary osteoarthritis of knee 07/14/2014  . Heme positive stool 05/21/2013  . Osteoarthritis, multiple sites 01/13/2011  . Back pain  01/13/2011  . Hypertension 12/06/2010  . ED (erectile dysfunction) 08/25/2010  . CLAUSTROPHOBIA 03/01/2010    Past Surgical History:  Procedure Laterality Date  . CYSTOSCOPY  04/11/2018   Procedure: CYSTOSCOPY FLEXIBLE;  Surgeon: [name], MD;  Location: [facility];  Service: Urology;;  NO SEEDS FOUND IN BLADDER  . HERNIA REPAIR  2009   inguinal  . JOINT REPLACEMENT Bilateral 2017   knees  . RADIOACTIVE SEED IMPLANT N/A 04/11/2018   Procedure: RADIOACTIVE SEED IMPLANT/BRACHYTHERAPY IMPLANT;  Surgeon: [name], MD;  Location: [facility];  Service: Urology;  Laterality: N/A;   71     SEEDS IMPLANTED  . SPACE OAR INSTILLATION N/A 04/11/2018   Procedure: SPACE OAR INSTILLATION;  Surgeon: [name], MD;  Location: [facility];  Service: Urology;  Laterality: N/A;  . TOTAL KNEE ARTHROPLASTY Bilateral 09/13/2015   Procedure: BILATERAL KNEE ARTHROPLASTY ;  Surgeon: Paralee Cancel, MD;  Location: [facility];  Service: Orthopedics;  Laterality: Bilateral;       Family History  Problem Relation Age of Onset  . Prostate cancer Father   . Prostate cancer Brother   . Colon cancer Neg Hx   . Rectal cancer Neg Hx   . Stomach cancer Neg Hx     Social History   Tobacco Use  . Smoking status: Never Smoker  . Smokeless tobacco: Never Used  Vaping  Use  . Vaping Use: Never used  Substance Use Topics  . Alcohol use: No    Alcohol/week: 0.0 standard drinks  . Drug use: Not Currently    Home Medications Prior to Admission medications   Medication Sig Start Date End Date Taking? Authorizing Provider  doxycycline (VIBRA-TABS) 100 MG tablet TAKE 1 TABLET BY MOUTH TWICE A DAY Patient taking differently: Take 100 mg by mouth 2 (two) times daily.  11/03/19  Yes Felipa Furnace, DPM  ibuprofen (ADVIL) 800 MG tablet Take 1 tablet (800 mg total) by mouth every 6 (six) hours as needed. Patient taking differently: Take 800 mg by  mouth every 6 (six) hours as needed (pain).  11/26/19  Yes Felipa Furnace, DPM  lisinopril (ZESTRIL) 20 MG tablet TAKE 1 TABLET BY MOUTH EVERY DAY Patient taking differently: Take 20 mg by mouth at bedtime.  11/13/19  Yes Chambliss, Jeb Levering, MD  meloxicam (MOBIC) 15 MG tablet TAKE 1 TABLET BY MOUTH EVERY DAY Patient taking differently: Take 15 mg by mouth daily.  07/23/19  Yes Chambliss, Jeb Levering, MD  naproxen sodium (ALEVE) 220 MG tablet Take 440 mg by mouth daily as needed (pain).   Yes [provider]  NARCAN 4 MG/0.1ML LIQD nasal spray kit Place into the nose as needed.  11/01/18  Yes [provider]  oxyCODONE-acetaminophen (PERCOCET) 10-325 MG tablet Take 1 tablet by mouth every 6 (six) hours as needed for up to 8 days for pain. 11/26/19 12/04/19 Yes Felipa Furnace, DPM  tamsulosin (FLOMAX) 0.4 MG CAPS capsule Take 1 capsule (0.4 mg total) by mouth daily. Patient taking differently: Take 0.8 mg by mouth at bedtime.  04/11/18  Yes [name], MD  testosterone cypionate (DEPOTESTOSTERONE CYPIONATE) 200 MG/ML injection Inject 100 mg into the muscle every 14 (fourteen) days.   Yes [provider]  diltiazem (CARDIZEM CD) 120 MG 24 hr capsule Take 1 capsule (120 mg total) by mouth daily. 11/26/19   Makhia Vosler, [place] N, PA-C  ENBREL SURECLICK 50 MG/ML injection Inject into the skin once a week. 07/07/19   [provider]  LORazepam (ATIVAN) 0.5 MG tablet One 90 minutes prior to procedure and may repeat if needed 30 minutes prior to procedure.  MRI brain. 11/24/19   Zenia Resides, MD  methotrexate (RHEUMATREX) 2.5 MG tablet Take 20 mg by mouth once a week.  08/29/18   [provider]    Allergies    Chlorthalidone  Review of Systems   Review of Systems  All other systems reviewed and are negative.   Physical Exam Updated Vital Signs BP 105/84   Pulse (!) 34   Resp (!) 21   Ht _0  (2.032 m)   Wt 95.3 kg   SpO2 100%   BMI 23.07 kg/m    Physical Exam Vitals and nursing note reviewed.  Constitutional:      General: He is not in acute distress.    Appearance: He is well-developed. He is not ill-appearing.  HENT:     Head: Normocephalic and atraumatic.  Eyes:     Conjunctiva/sclera: Conjunctivae normal.  Cardiovascular:     Rate and Rhythm: Tachycardia present. Rhythm irregular.  Pulmonary:     Effort: Pulmonary effort is normal. No respiratory distress.     Breath sounds: Normal breath sounds.  Abdominal:     General: Bowel sounds are normal.     Palpations: Abdomen is soft.     Tenderness: There is no abdominal  tenderness.  Musculoskeletal:     Right lower leg: No edema.     Left lower leg: No edema.  Skin:    General: Skin is warm.  Neurological:     Mental Status: He is alert.  Psychiatric:        Behavior: Behavior normal.     ED Results / Procedures / Treatments   Labs (all labs ordered are listed, but only abnormal results are displayed) Labs Reviewed  CBC WITH DIFFERENTIAL/PLATELET - Abnormal; Notable for the following components:      Result Value   RDW 15.6 (*)    All other components within normal limits  COMPREHENSIVE METABOLIC PANEL - Abnormal; Notable for the following components:   BUN 26 (*)    Calcium 8.8 (*)    All other components within normal limits  MAGNESIUM  TSH    EKG EKG Interpretation  Date/Time:  Wednesday November 26 2019 14:46:47 EDT Ventricular Rate:  134 PR Interval:    QRS Duration: 80 QT Interval:  378 QTC Calculation: 564 R Axis:   84 Text Interpretation: Atrial flutter with 2:1 A-V conduction ST & T wave abnormality, consider lateral ischemia Abnormal ECG Confirmed by Virgel Manifold [phone]) on 11/26/2019 3:10:12 PM   Radiology No results found.  Procedures .Critical Care Performed by: Michah Minton, [place] N, PA-C Authorized by: Alexica Schlossberg, [place] N, PA-C   Critical care provider statement:    Critical care time (minutes):  45   Critical care time was  exclusive of:  Separately billable procedures and treating other patients and teaching time   Critical care was necessary to treat or prevent imminent or life-threatening deterioration of the following conditions:  Cardiac failure   Critical care was time spent personally by me on the following activities:  Discussions with consultants, evaluation of patient's response to treatment, examination of patient, ordering and performing treatments and interventions, ordering and review of laboratory studies, ordering and review of radiographic studies, pulse oximetry, re-evaluation of patient's condition, obtaining history from patient or surrogate and review of old charts   I assumed direction of critical care for this patient from another provider in my specialty: no     (including critical care time)  Medications Ordered in ED Medications  diltiazem (CARDIZEM) 1 mg/mL load via infusion 20 mg (20 mg Intravenous Bolus from Bag 11/26/19 1602)    And  diltiazem (CARDIZEM) 125 mg in dextrose 5% 125 mL (1 mg/mL) infusion (0 mg/hr Intravenous Stopped 11/26/19 1645)    ED Course  I have reviewed the triage vital signs and the nursing notes.  Pertinent labs & imaging results that were available during my care of the patient were reviewed by me and considered in my medical decision making (see chart for details).  Clinical Course as of Nov 26 1903  Wed Nov 26, 2019  1904 Inaccurate documentation.   Pulse Rate(!): 34 [JR]    Clinical Course User Index [JR] Keali Mccraw, [place] N, PA-C   MDM Rules/Calculators/A&P                          Patient presenting with fast heart rate, found to be in a flutter with 2-1 block and RVR.  This was present during recent ED visit at the end of July, unclear of duration of this arrhythmia as patient is completely asymptomatic. He denies palpitations, chest pain, shortness of breath, fatigue.  He left AMA versus eloped from the ED during last visit, therefore this  was unable  to be completely addressed.  He is given Cardizem load and infusion here for rate control, as rate is 140s on arrival.  Pt does not meet criteria for cardioversion.  Electrolytes checked, metabolic panel and magnesium are unremarkable, TSH is sent out and processing.    Rate improved to 100 with Cardizem.  Patient stating he is ready for discharge, regardless of pending work-up and monitoring.  He does have cardiology he can follow with, also provided referral for A. fib clinic.  Will defer anticoagulation initiation at this time to cardiology team given patient's history of recent falls and confusion, currently being worked up by PCP per last outpatient note.  He is prescribed Cardizem CD for rate control.  Had discussion with patient regarding significance of diagnosis, with associated potentially life-threatening health risks including PE or stroke.  Patient verbalized understanding.  He is instructed to take Cardizem, and follow closely with cardiology.  Return precautions discussed.  Patient discussed with and evaluated by Dr. Wilson Singer.  Final Clinical Impression(s) / ED Diagnoses Final diagnoses:  Atrial flutter with rapid ventricular response ([place])    Rx / DC Orders ED Discharge Orders         Ordered    diltiazem (CARDIZEM CD) 120 MG 24 hr capsule  Daily     Discontinue  Reprint     11/26/19 1643           Breken Nazari, [place] N, PA-C 11/26/19 1904    Mackenzie Lia, [place] N, PA-C 11/26/19 Tora Duck, MD 11/30/19 1231

## 2019-11-26 NOTE — ED Notes (Signed)
Pt got agitated and said he didn't want to stay any more.  Wife nor nurse could talk him into staying.  PA notified.  She came to room to see pt and put him up for discharge.

## 2019-11-26 NOTE — ED Notes (Signed)
ED Provider at bedside. 

## 2019-11-26 NOTE — ED Triage Notes (Signed)
Pt sent here by PMD with new onset a flutter , pt denies any complaints

## 2019-11-26 NOTE — Discharge Instructions (Signed)
It is important that you follow closely with your heart doctor and the atrial fibrillation clinic. Begin taking the medication, cardizem, as directed daily.  Please discuss recommendations for starting a blood thinner with your heart doctor.  Monitor your heart rate and blood pressure. Return to the ED if you begin having chest pain, shortness of breath, palpitations, or new or concerning symptoms.

## 2019-11-26 NOTE — Addendum Note (Signed)
Addended by: Boneta Lucks on: 11/26/2019 12:36 PM   Modules accepted: Orders

## 2019-11-26 NOTE — ED Provider Notes (Signed)
Medical screening examination/treatment/procedure(s) were conducted as a shared visit with non-physician practitioner(s) and myself.  I personally evaluated the patient during the encounter.  EKG Interpretation  Date/Time:  Wednesday November 26 2019 14:46:47 EDT Ventricular Rate:  134 PR Interval:    QRS Duration: 80 QT Interval:  378 QTC Calculation: 564 R Axis:   84 Text Interpretation: Atrial flutter with 2:1 A-V conduction ST & T wave abnormality, consider lateral ischemia Abnormal ECG Confirmed by Virgel Manifold [phone]) on 11/26/2019 3:71:48 PM  69 year old male with what appears to be atrial flutter.  Unclear onset of symptoms but at least several weeks, possibly months.  He is not a very good historian.  He seems to downplay his symptoms.  Most of his history is from his significant other at bedside and also review of records.  Today he was going to have an outpatient procedure on his foot when the anesthesiologist noted that his heart rate was significantly elevated.  This procedure was canceled he was advised to come the emergency room.  Of note, he has also been seen recently in urgent care and also in the emergency room and noted to be in A. fib/flutter.  He eloped from the emergency room on 11/18/2019.  Labs including TSH were fairly unremarkable.  He denies any dizziness, lightheadedness or shortness of breath.  Again I question the reliability of his history though.  He tells me that his heart rate has been elevated like this ever since he was a child.  We will start on Cardizem and try to get his rate under reasonable control.  He is not a candidate for ED cardioversion given the duration of the dysrhythmia prior to today.  If discharged, will start him on Cardizem and have him follow-up in the atrial fibrillation clinic.  Deferring from anticoagulation at this time.  He has had multiple falls recently and will defer decision to initiate an anticoagulant or not to cardiology.   Virgel Manifold, MD 11/27/19 [place]

## 2019-11-27 ENCOUNTER — Telehealth (HOSPITAL_COMMUNITY): Payer: Self-pay | Admitting: Nurse Practitioner

## 2019-11-27 NOTE — Telephone Encounter (Signed)
Called and left message for patient to call back.  Need to schedule ED f/u from 11/26/19 visit to [facility] ED.  Not established with either provider here so whoever has opening that works with patient's schedule is fine.

## 2019-12-02 ENCOUNTER — Telehealth: Payer: Self-pay | Admitting: *Deleted

## 2019-12-02 NOTE — Telephone Encounter (Signed)
Park Liter, PA from [facility] called and wanted to report her recent interaction with patients wife.  You can call her back @ [phone] ( can get her out of the room) if needed.   Wife called [facility] ir[facility] because of the meds that [facility] sent him ( he has been on this regimen for several years)  Wife reports that patient does not dicuss meds with her but she is trying to figure out what to do.  Wife reports that he left AMA from hospital after fall on 7/25 and she is trying to figure out what is going on.    Olin Hauser reports that wife states "Everyone is trying to excuse themselves from responsibility".  Olin Hauser urges wife to take him to the ED if he is having issues but she refuses to go to ER and hung up on Pamela.  Olin Hauser reports that to her knowledge pt has never overtaken meds, never mentioned anything out of the ordinary.  To PCP as FYI. Christen Bame, CMA

## 2019-12-03 ENCOUNTER — Encounter: Payer: Medicare Other | Admitting: Podiatry

## 2019-12-06 ENCOUNTER — Other Ambulatory Visit: Payer: Self-pay | Admitting: Family Medicine

## 2019-12-09 ENCOUNTER — Ambulatory Visit (INDEPENDENT_AMBULATORY_CARE_PROVIDER_SITE_OTHER): Payer: Medicare Other | Admitting: Family Medicine

## 2019-12-09 ENCOUNTER — Other Ambulatory Visit: Payer: Self-pay

## 2019-12-09 DIAGNOSIS — I10 Essential (primary) hypertension: Secondary | ICD-10-CM

## 2019-12-09 DIAGNOSIS — R410 Disorientation, unspecified: Secondary | ICD-10-CM | POA: Diagnosis not present

## 2019-12-09 DIAGNOSIS — R634 Abnormal weight loss: Secondary | ICD-10-CM

## 2019-12-09 DIAGNOSIS — R195 Other fecal abnormalities: Secondary | ICD-10-CM

## 2019-12-09 DIAGNOSIS — I471 Supraventricular tachycardia: Secondary | ICD-10-CM

## 2019-12-09 MED ORDER — DILTIAZEM HCL ER COATED BEADS 120 MG PO CP24: 120.0000 mg | ORAL_CAPSULE | Freq: Every day | ORAL | 0 refills | Status: DC

## 2019-12-09 NOTE — Patient Instructions (Addendum)
Good to see you today!  Thanks for coming in.  Call Dr Posey Pronto - ask him if you need to take the ibuprofen.  I dont think you do.  It interferes with Meloxicam   Ask [facility] to decrease your pain medications  Keep the appointment with Dr Dayna Ramus - Urology  Make an appointment to see Dr Richarda Blade Rheumatology to follow up on the arthritis   For the Weight loss - Try to eat 3 full meals a day and drink an  supplement drink twice a day   For the heart rate - take the Cartia every day.  Let me know if you are having chest pain or lightheadness   I will refer you to Gastroenterologist for a colonoscopy.     Come back in one month

## 2019-12-09 NOTE — Assessment & Plan Note (Signed)
Back to normal in recent CMET.  Will follow

## 2019-12-09 NOTE — Progress Notes (Signed)
    SUBJECTIVE:   CHIEF COMPLAINT / HPI:   ATRIAL TACHYCARDIA No current complaints of lightheadness or palpitations or cp.  Ran out of cardizem that he got from ER about 3 days ago   Weight Loss  Feels like he does not have an appetite.   No nausea and vomiting or bleeding or fever or swollen glands.  Feels the appetite may be due to his narcotic medications  CONFUSION Feels this is definitely due to the narcotics that he has been cutting down at his wife's urging.  No focal weakness or visual changes   HYPERTENSION Home BP Monitoring readings: not checking  Chest pain- no     Medication Adherence  Reports taking lisinopril. Lightheadedness-  no  Edema- no   PERTINENT  PMH / PSH: left during recent ER workup  OBJECTIVE:   BP (!) 142/72   Pulse 98   Wt 198 lb 9.6 oz (90.1 kg)   SpO2 95%   BMI 21.82 kg/m   Psych:  Cognition and judgment appear intact. Alert, communicative  and cooperative with normal attention span and concentration. No apparent delusions, illusions, hallucinations Oriented x 3  Mobility:able to get up and down from exam table without assistance or distress Heart - distant regular rhythm rate about 96 Lungs:  Normal respiratory effort, chest expands symmetrically. Lungs are clear to auscultation, no crackles or wheezes. Hands - thenar wasting bilaterally Neck Axilla - no adenopathy  Clean wound of R foot without discharge or surrounding erythema  ASSESSMENT/PLAN:   Hypertension At goal today.  Recent cmet unremarkable  Confusion Normal cognition today - knows his medications and recent events.  Likely due to narcotics that he is taking but has cut back.  Urged him to follow up with his pain center and to continue to wean   Heme positive stool With recent weight loss even more concerning.  He agreed to follow up with GI   Hyperbilirubinemia Back to normal in recent CMET.  Will follow   Weight loss, non-intentional Quite concerning without obvious  cause on recent lab work.  Asked to follow up with his urologist for his prostate cancer and for GI with heme positive stool.  His intake is down by report.   May be due to narcotics.  He plans to cut down.  Urged to increase intake   Atrial tachycardia ([place]) Documented at recent ER visit.  Tolerating being off cardizem but given todays rate will restart and monitor      [name], MD [place]

## 2019-12-09 NOTE — Assessment & Plan Note (Signed)
With recent weight loss even more concerning.  He agreed to follow up with GI

## 2019-12-09 NOTE — Assessment & Plan Note (Signed)
At goal today.  Recent cmet unremarkable

## 2019-12-09 NOTE — Assessment & Plan Note (Signed)
Documented at recent ER visit.  Tolerating being off cardizem but given todays rate will restart and monitor

## 2019-12-09 NOTE — Assessment & Plan Note (Signed)
Normal cognition today - knows his medications and recent events.  Likely due to narcotics that he is taking but has cut back.  Urged him to follow up with his pain center and to continue to wean

## 2019-12-09 NOTE — Assessment & Plan Note (Signed)
Quite concerning without obvious cause on recent lab work.  Asked to follow up with his urologist for his prostate cancer and for GI with heme positive stool.  His intake is down by report.   May be due to narcotics.  He plans to cut down.  Urged to increase intake

## 2019-12-10 ENCOUNTER — Encounter: Payer: Medicare Other | Admitting: Podiatry

## 2019-12-10 DIAGNOSIS — G894 Chronic pain syndrome: Secondary | ICD-10-CM | POA: Diagnosis not present

## 2019-12-10 DIAGNOSIS — S73199A Other sprain of unspecified hip, initial encounter: Secondary | ICD-10-CM | POA: Diagnosis not present

## 2019-12-10 DIAGNOSIS — Z79899 Other long term (current) drug therapy: Secondary | ICD-10-CM | POA: Diagnosis not present

## 2019-12-10 DIAGNOSIS — Z79891 Long term (current) use of opiate analgesic: Secondary | ICD-10-CM | POA: Diagnosis not present

## 2019-12-10 DIAGNOSIS — M25529 Pain in unspecified elbow: Secondary | ICD-10-CM | POA: Diagnosis not present

## 2019-12-10 DIAGNOSIS — M069 Rheumatoid arthritis, unspecified: Secondary | ICD-10-CM | POA: Diagnosis not present

## 2019-12-15 ENCOUNTER — Other Ambulatory Visit: Payer: Self-pay | Admitting: Podiatry

## 2019-12-16 DIAGNOSIS — M0579 Rheumatoid arthritis with rheumatoid factor of multiple sites without organ or systems involvement: Secondary | ICD-10-CM | POA: Diagnosis not present

## 2019-12-16 DIAGNOSIS — M79604 Pain in right leg: Secondary | ICD-10-CM | POA: Diagnosis not present

## 2019-12-16 DIAGNOSIS — M150 Primary generalized (osteo)arthritis: Secondary | ICD-10-CM | POA: Diagnosis not present

## 2019-12-16 DIAGNOSIS — C61 Malignant neoplasm of prostate: Secondary | ICD-10-CM | POA: Diagnosis not present

## 2019-12-22 ENCOUNTER — Ambulatory Visit (INDEPENDENT_AMBULATORY_CARE_PROVIDER_SITE_OTHER): Payer: Medicare Other

## 2019-12-22 ENCOUNTER — Other Ambulatory Visit: Payer: Self-pay

## 2019-12-22 ENCOUNTER — Encounter: Payer: Self-pay | Admitting: Podiatry

## 2019-12-22 ENCOUNTER — Ambulatory Visit: Payer: Medicare Other | Admitting: Podiatry

## 2019-12-22 VITALS — Temp 97.0°F

## 2019-12-22 DIAGNOSIS — L02611 Cutaneous abscess of right foot: Secondary | ICD-10-CM

## 2019-12-22 DIAGNOSIS — L03031 Cellulitis of right toe: Secondary | ICD-10-CM

## 2019-12-22 DIAGNOSIS — L97512 Non-pressure chronic ulcer of other part of right foot with fat layer exposed: Secondary | ICD-10-CM | POA: Diagnosis not present

## 2019-12-22 DIAGNOSIS — L97522 Non-pressure chronic ulcer of other part of left foot with fat layer exposed: Secondary | ICD-10-CM | POA: Diagnosis not present

## 2019-12-22 MED ORDER — SANTYL 250 UNIT/GM EX OINT
1.0000 | TOPICAL_OINTMENT | Freq: Every day | CUTANEOUS | 0 refills | Status: DC
Start: 2019-12-22 — End: 2020-02-02

## 2019-12-22 MED ORDER — CIPROFLOXACIN HCL 500 MG PO TABS
500.0000 mg | ORAL_TABLET | Freq: Two times a day (BID) | ORAL | 0 refills | Status: DC
Start: 2019-12-22 — End: 2020-02-02

## 2019-12-22 NOTE — Addendum Note (Signed)
Addended by: Celene Skeen A on: 12/22/2019 01:09 PM   Modules accepted: Orders

## 2019-12-22 NOTE — Addendum Note (Signed)
Addended by: Celene Skeen A on: 12/22/2019 01:06 PM   Modules accepted: Orders

## 2019-12-22 NOTE — Progress Notes (Signed)
Subjective:   Patient ID: Thomas [name], male   DOB: 70 y.o.   MRN: [id number]   HPI Patient states he was put on a new blood pressure medicine and he seemed to develop some swelling in his right foot that led to the right big toe getting swelling over the last week that he is noted drainage.  He is currently on doxycycline and he is on a new blood pressure medicine.  Patient states the left area is still open   ROS      Objective:  Physical Exam  This patient who has severe neuropathy and has had multiple problems has now developed swelling of his right big toe with a small ulceration on the medial side and a small abscess on the right lateral side of the big toe that has what appears to be local purulence.  There is no proximal erythema there is no proximal calor there is edema but it appears to be more coming from his ankle with a negative Bevelyn Buckles' sign noted.  The left shows the same clean area that he was due to have osteotomy on from Dr. Posey Pronto but was not able to do because of high blood pressure     Assessment:  Swelling of the right foot which is most likely due to medicines he started with stress that occurred against the big toe which is led to an abscess ulceration along with chronic ulceration left plantar foot.  No systemic temperature currently     Plan:  Reviewed condition and applied padding left to take pressure off this and discussed that and we will also continue antibiotics doxycycline and start him on Cipro.  For the right I did open up the abscess on the lateral side I did culture this and we will send for culture and sensitivity and I advised on soaks and I did discuss there is a very good chance he is going to lose his big toe on his right foot.  Patient is being followed by Dr. Posey Pronto and will see him later this week and decision will be made and I will discuss this case with him.  Patient is given strict instructions if you develop any systemic signs of infection to go  to the hospital and contact us immediately  X-rays indicate that there is possible for changes at the head of the proximal phalanx right big toe with obvious spur formation

## 2019-12-23 ENCOUNTER — Ambulatory Visit: Payer: Medicare Other | Admitting: Orthopaedic Surgery

## 2019-12-23 ENCOUNTER — Encounter: Payer: Self-pay | Admitting: Orthopaedic Surgery

## 2019-12-23 VITALS — Ht 76.0 in | Wt 217.0 lb

## 2019-12-23 DIAGNOSIS — M7061 Trochanteric bursitis, right hip: Secondary | ICD-10-CM | POA: Diagnosis not present

## 2019-12-23 MED ORDER — BUPIVACAINE HCL 0.5 % IJ SOLN
3.0000 mL | INTRAMUSCULAR | Status: AC | PRN
  Administered 2019-12-23: 3 mL via INTRA_ARTICULAR

## 2019-12-23 MED ORDER — METHYLPREDNISOLONE ACETATE 40 MG/ML IJ SUSP
40.0000 mg | INTRAMUSCULAR | Status: AC | PRN
Start: 2019-12-23 — End: 2019-12-23
  Administered 2019-12-23: 40 mg via INTRA_ARTICULAR

## 2019-12-23 MED ORDER — LIDOCAINE HCL 1 % IJ SOLN
3.0000 mL | INTRAMUSCULAR | Status: AC | PRN
  Administered 2019-12-23: 3 mL

## 2019-12-23 NOTE — Progress Notes (Signed)
Office Visit Note   Patient: Thomas [name]           Date of Birth: Jul 20, 1950           MRN: [id number] Visit Date: 12/23/2019              Requested by: [name], MD [place],  [place] [address] PCP: [name], MD   Assessment & Plan: Visit Diagnoses:  1. Trochanteric bursitis, right hip     Plan: Impression is recurrent right hip trochanteric bursitis.  We reinjected this with cortisone today.  He will follow up with [place] as needed.  Follow-Up Instructions: Return if symptoms worsen or fail to improve.   Orders:  No orders of the defined types were placed in this encounter.  No orders of the defined types were placed in this encounter.     Procedures: Large Joint Inj: R greater trochanter on 12/23/2019 12:50 PM Indications: pain Details: 22 G needle  Arthrogram: No  Medications: 3 mL lidocaine 1 %; 3 mL bupivacaine 0.5 %; 40 mg methylPREDNISolone acetate 40 MG/ML Patient was prepped and draped in the usual sterile fashion.       Clinical Data: No additional findings.   Subjective: Chief Complaint  Patient presents with  . Right Hip - Pain    HPI patient is a pleasant 69 year old gentleman who comes in today with recurrent right lateral hip pain.  He does have a history of trochanteric bursitis which has been injected twice this past year.  His last injection was on 10/01/2019 which did seem to help but only lasted for about 2 months.  All of his pain is to the lateral aspect.  Worse with sleeping on the right side.  He cannot think of anything else that contributes to his pain.  No numbness, tingling or burning.  Of note, previous MRI shows moderate OA of the right hip with labral tears.  He does not currently complain of groin or anterior thigh pain.  Review of Systems as detailed in HPI.  All others reviewed and are negative.   Objective: Vital Signs: Ht 6\' 4"  (1.93 m)   Wt 217 lb (98.4 kg)   BMI 26.41 kg/m     Physical Exam well-developed well-nourished gentleman no acute distress.  Alert and oriented x3.  Ortho Exam right hip has marked tenderness to the greater trochanter.  Negative logroll.  He does have increased pain with hip abduction.  He is neurovascularly intact distally.  Specialty Comments:  No specialty comments available.  Imaging: DG Foot 2 Views Left  Result Date: 12/22/2019 Please see detailed radiograph report in office note.  DG Foot 2 Views Right  Result Date: 12/22/2019 Please see detailed radiograph report in office note.    PMFS History: Patient Active Problem List   Diagnosis Date Noted  . Atrial tachycardia ([place]) 12/09/2019  . Weight loss, non-intentional 11/24/2019  . Confusion 11/24/2019  . Hyperbilirubinemia 11/24/2019  . Chronic right hip pain 06/19/2019  . Degenerative tear of acetabular labrum of right hip 05/02/2019  . Primary osteoarthritis of right hip 05/02/2019  . Skin ulcer ([place]) 01/29/2019  . Malignant neoplasm of prostate ([place]) 01/16/2018  . Insomnia 10/02/2017  . Cyclic citrullinated peptide (CCP) antibody positive 05/31/2016  . S/P bilateral TKA 09/13/2015  . Other bilateral secondary osteoarthritis of knee 07/14/2014  . Heme positive stool 05/21/2013  . Osteoarthritis, multiple sites 01/13/2011  . Back pain 01/13/2011  . Hypertension 12/06/2010  .  ED (erectile dysfunction) 08/25/2010  . CLAUSTROPHOBIA 03/01/2010   Past Medical History:  Diagnosis Date  . Cancer [facility])    prostate  . Claustrophobia    quite severe  . Hypertension   . Hypoglycemia    occ  . Left knee DJD    Xray 12/23/08  . Prostate cancer [facility])     Family History  Problem Relation Age of Onset  . Prostate cancer Father   . Prostate cancer Brother   . Colon cancer Neg Hx   . Rectal cancer Neg Hx   . Stomach cancer Neg Hx     Past Surgical History:  Procedure Laterality Date  . CYSTOSCOPY  04/11/2018   Procedure: CYSTOSCOPY FLEXIBLE;  Surgeon: [name], MD;  Location: [facility];  Service: Urology;;  NO SEEDS FOUND IN BLADDER  . HERNIA REPAIR  2009   inguinal  . JOINT REPLACEMENT Bilateral 2017   knees  . RADIOACTIVE SEED IMPLANT N/A 04/11/2018   Procedure: RADIOACTIVE SEED IMPLANT/BRACHYTHERAPY IMPLANT;  Surgeon: [name], MD;  Location[facility];  Service: Urology;  Laterality: N/A;   71     SEEDS IMPLANTED  . SPACE OAR INSTILLATION N/A 04/11/2018   Procedure: SPACE OAR INSTILLATION;  Surgeon: [name], MD;  Location[facility];  Service: Urology;  Laterality: N/A;  . TOTAL KNEE ARTHROPLASTY Bilateral 09/13/2015   Procedure: BILATERAL KNEE ARTHROPLASTY ;  Surgeon: Paralee Cancel, MD;  Location: [facility];  Service: Orthopedics;  Laterality: Bilateral;   Social History   Occupational History  . Not on file  Tobacco Use  . Smoking status: Never Smoker  . Smokeless tobacco: Never Used  Vaping Use  . Vaping Use: Never used  Substance and Sexual Activity  . Alcohol use: No    Alcohol/week: 0.0 standard drinks  . Drug use: Not Currently  . Sexual activity: Yes

## 2019-12-24 ENCOUNTER — Other Ambulatory Visit: Payer: Self-pay | Admitting: Family Medicine

## 2019-12-24 ENCOUNTER — Encounter: Payer: Medicare Other | Admitting: Podiatry

## 2019-12-24 HISTORY — PX: AMPUTATION TOE: SHX6595

## 2019-12-24 NOTE — Progress Notes (Signed)
Should be responsive to antibiotics

## 2019-12-25 ENCOUNTER — Ambulatory Visit: Payer: Medicare Other | Admitting: Podiatry

## 2019-12-25 ENCOUNTER — Other Ambulatory Visit: Payer: Medicare Other

## 2019-12-25 ENCOUNTER — Encounter: Payer: Self-pay | Admitting: Podiatry

## 2019-12-25 ENCOUNTER — Encounter: Payer: Self-pay | Admitting: Family Medicine

## 2019-12-25 ENCOUNTER — Other Ambulatory Visit: Payer: Self-pay

## 2019-12-25 ENCOUNTER — Ambulatory Visit (INDEPENDENT_AMBULATORY_CARE_PROVIDER_SITE_OTHER): Payer: Medicare Other | Admitting: Family Medicine

## 2019-12-25 VITALS — BP 130/82 | HR 130 | Wt 218.2 lb

## 2019-12-25 DIAGNOSIS — L02611 Cutaneous abscess of right foot: Secondary | ICD-10-CM | POA: Diagnosis not present

## 2019-12-25 DIAGNOSIS — L03031 Cellulitis of right toe: Secondary | ICD-10-CM | POA: Diagnosis not present

## 2019-12-25 DIAGNOSIS — Z8679 Personal history of other diseases of the circulatory system: Secondary | ICD-10-CM

## 2019-12-25 DIAGNOSIS — L97512 Non-pressure chronic ulcer of other part of right foot with fat layer exposed: Secondary | ICD-10-CM

## 2019-12-25 DIAGNOSIS — R600 Localized edema: Secondary | ICD-10-CM | POA: Diagnosis not present

## 2019-12-25 MED ORDER — FUROSEMIDE 20 MG PO TABS: 20.0000 mg | ORAL_TABLET | Freq: Every day | ORAL | 3 refills | Status: DC

## 2019-12-25 MED ORDER — CIPROFLOXACIN HCL 500 MG PO TABS
500.0000 mg | ORAL_TABLET | Freq: Two times a day (BID) | ORAL | 0 refills | Status: DC
Start: 2019-12-25 — End: 2020-01-12

## 2019-12-25 MED ORDER — DOXYCYCLINE HYCLATE 100 MG PO TABS: 100.0000 mg | ORAL_TABLET | Freq: Two times a day (BID) | ORAL | 0 refills | Status: DC

## 2019-12-25 NOTE — Patient Instructions (Addendum)
It was nice to meet you.   You were seen for your increase heart rate and lower leg swelling.   I got a BNP that will assess the amount of swelling. I will call you with abnormal results or send a letter with normal results.  I think this is due to a heart condition and I have referred you to the cardiologist. You should get a call within 1 week to schedule an appointment. Please let us know if you do not hear from the cardiology office.   In the meantime I have started a new medicine Lasix 20 mg daily to help get rid of some of the fluid.   Also continue to take your diltiazem. This will control your heart rate. Today it was 130 bpm. We ideally like it to be less than 100 at rest.   Please call the clinic at [phone] if your symptoms worsen or you have any concerns. It was our pleasure to serve you.

## 2019-12-25 NOTE — Progress Notes (Signed)
    SUBJECTIVE:   CHIEF COMPLAINT / HPI:   Lower extremity edema Lower leg swelling worsening since starting diltiazem the patient believes. He is also now experiencing shortness of breath with ADLs. Recently saw the foot doctor who sent him here. He would like something to get rid of the swelling.   Atrial flutter Does not feel as if his heart is beating weirdly. Non-compliance of diltazem; he believes this to be the source of his swelling.   PERTINENT  PMH / PSH: Foot ulcers (sees Dr. Posey Pronto note reviewed patient with debridement of wounds today), Prostate cancer, HTN, hx of weight loss (visits 8/2 & 8/17)  OBJECTIVE:   BP 130/82   Pulse (!) 130   Wt 218 lb 3.2 oz (99 kg)   SpO2 98%   BMI 26.56 kg/m   General: Appears well, no acute distress. Age appropriate. Cardiac: Increased rate w/ irregular rhythm, no murmurs Respiratory: CTAB, normal effort Extremities: visibly noticeable BLE, toes not visualized during this visit  ASSESSMENT/PLAN:   Lower extremity edema Wt. Is up 20 lbs in < 1 month. Noticeable LE edema. Endorsing increasing SOB with ADLs. O2 sat in office appropriate. Tachycardic. No prior echo or BNP to compare. No known prior cardiac hx prior to this year. Patient believes swelling started with starting Cardizem.  -Referral to cardiololgy -obtain BNP -Start Lasix 20 mg daily; consider increasing while monitoring kidney function if appropriate -F/u w/ PCP scheduled    Hx of atrial flutter Review recent notes of ER visit of initial finding and prior visit with PCP. Was tolerating dilt well. Tachycardia and irregular rhythm in office today. Patient endorses medication non-compliance with cardizem due to the belief it is exacerbating the swelling. Discussed this will control heart rate which should ideally be <100bpm.  -Continue cardizem 120 mg q24h   Gerlene Fee, DO [place]   *Discussed patient plan with PCP, Dr. Erin Hearing

## 2019-12-26 LAB — WOUND CULTURE
GRAM STAIN:: NONE SEEN
MICRO NUMBER:: 10888423
SPECIMEN QUALITY:: ADEQUATE

## 2019-12-27 LAB — BRAIN NATRIURETIC PEPTIDE: BNP: 901.9 pg/mL — ABNORMAL HIGH (ref 0.0–100.0)

## 2019-12-30 DIAGNOSIS — R600 Localized edema: Secondary | ICD-10-CM | POA: Insufficient documentation

## 2019-12-30 DIAGNOSIS — Z8679 Personal history of other diseases of the circulatory system: Secondary | ICD-10-CM | POA: Insufficient documentation

## 2019-12-30 NOTE — Progress Notes (Signed)
Subjective:  Patient ID: Thomas [name], male    DOB: 04-24-1951,  MRN: [id number]  Chief Complaint  Patient presents with  . Foot Ulcer    Patient states that he was placed on new bp medication which caused his legs and feet to swell    69 y.o. male presents for wound care.  Patient presents with a follow-up of left submetatarsal 2 chronic ulceration for which I was planning on doing floating osteotomy.  However patient was lost to follow-up and once scheduled for surgery patient became tachycardic for which elective surgery was canceled.  Patient now presents with right hallux ulceration that is worsening as well as right lower extremity swelling that is acute onset after being started on antihypertensive medication.  Patient is getting close to follow-up with primary care physician to help decrease the fluid to the lower extremity.  However I am worried about the right side that is continues to progress worse.  Patient is a high risk of losing the right toe versus the foot.  Patient states understanding.  Review of Systems: Negative except as noted in the HPI. Denies N/V/F/Ch.  Past Medical History:  Diagnosis Date  . Cancer [facility])    prostate  . Claustrophobia    quite severe  . Hypertension   . Hypoglycemia    occ  . Left knee DJD    Xray 12/23/08  . Prostate cancer [facility])     Current Outpatient Medications:  .  ciprofloxacin (CIPRO) 500 MG tablet, Take 1 tablet (500 mg total) by mouth 2 (two) times daily., Disp: 20 tablet, Rfl: 0 .  ciprofloxacin (CIPRO) 500 MG tablet, Take 1 tablet (500 mg total) by mouth 2 (two) times daily., Disp: 20 tablet, Rfl: 0 .  collagenase (SANTYL) ointment, Apply 1 application topically daily., Disp: 15 g, Rfl: 0 .  diltiazem (CARDIZEM CD) 120 MG 24 hr capsule, Take 1 capsule (120 mg total) by mouth daily., Disp: 90 capsule, Rfl: 0 .  doxycycline (VIBRA-TABS) 100 MG tablet, Take 1 tablet (100 mg total) by mouth 2 (two) times daily., Disp: 20 tablet,  Rfl: 0 .  ENBREL SURECLICK 50 MG/ML injection, Inject into the skin once a week. , Disp: , Rfl:  .  furosemide (LASIX) 20 MG tablet, Take 1 tablet (20 mg total) by mouth daily., Disp: 30 tablet, Rfl: 3 .  HYDROcodone-acetaminophen (NORCO) 10-325 MG tablet, Take 1 tablet by mouth 4 (four) times daily., Disp: , Rfl:  .  ibuprofen (ADVIL) 800 MG tablet, Take 1 tablet (800 mg total) by mouth every 6 (six) hours as needed., Disp: 60 tablet, Rfl: 1 .  lisinopril (ZESTRIL) 20 MG tablet, TAKE 1 TABLET BY MOUTH EVERY DAY, Disp: 30 tablet, Rfl: 1 .  LORazepam (ATIVAN) 0.5 MG tablet, One 90 minutes prior to procedure and may repeat if needed 30 minutes prior to procedure.  MRI brain., Disp: 2 tablet, Rfl: 0 .  meloxicam (MOBIC) 15 MG tablet, TAKE 1 TABLET BY MOUTH EVERY DAY (Patient taking differently: Take 15 mg by mouth daily. ), Disp: 90 tablet, Rfl: 1 .  methotrexate (RHEUMATREX) 2.5 MG tablet, Take 20 mg by mouth once a week. , Disp: , Rfl:  .  NARCAN 4 MG/0.1ML LIQD nasal spray kit, Place into the nose as needed. , Disp: , Rfl:  .  tamsulosin (FLOMAX) 0.4 MG CAPS capsule, Take 1 capsule (0.4 mg total) by mouth daily., Disp: 30 capsule, Rfl: 11 .  testosterone cypionate (DEPOTESTOSTERONE CYPIONATE) 200 MG/ML injection, Inject  100 mg into the muscle every 14 (fourteen) days., Disp: , Rfl:   Social History   Tobacco Use  Smoking Status Never Smoker  Smokeless Tobacco Never Used    Allergies  Allergen Reactions  . Chlorthalidone Other (See Comments)    "Makes me light headed and I don't like the way it makes me feel"   Objective:   There were no vitals filed for this visit. There is no height or weight on file to calculate BMI. Constitutional Well developed. Well nourished.  Vascular Dorsalis pedis faintly pulses palpable bilaterally. Posterior tibial faintly pulses palpable bilaterally. Capillary refill normal to all digits.  No cyanosis or clubbing noted. Pedal hair growth normal.    Neurologic Normal speech. Oriented to person, place, and time. Protective sensation absent  Dermatologic Wound Location: Left submetatarsal 2 probes down to deep tissue no exposure of bone.  No purulent drainage noted.  No other clinical signs of infection noted. Wound Base: Mixed Granular/Fibrotic Peri-wound: Normal Exudate: Scant/small amount Serous exudate Wound Measurements: -See below  Right hallux ulceration with fibrogranular wound base probing down to bone with bone exposure of the head of the proximal phalanx.  No erythema or cellulitis present.  No purulent drainage was expressed.  Orthopedic: No pain to palpation either foot.   Radiographs: 3 view of skeletally mature adult foot: Calcification is noted at the posterior tibial artery.  Mild midfoot arthritis noted.  No cortical irregularity or destruction noted at the metatarsal head.  Mild midfoot arthritis noted.  No other bony abnormalities noted.  Right foot review of x-rays taken on 830 showed osteomyelitic changes to the head of the proximal phalanx consistent with osteomyelitic changes.  Bilateral vessel calcification noted as well to the lower extremity  Assessment:   1. Foot ulcer with fat layer exposed, right ([place])   2. Cellulitis and abscess of toe of right foot   3. Right foot ulcer, with fat layer exposed ([place])    Plan:  Patient was evaluated and treated and all questions answered.  Right hallux epidermal lysis with wound and exposed proximal phalanx -I explained to the patient the etiology of the wound formation with underlying osteomyelitis and various treatment options were extensively discussed.  At this time patient does not have any acute signs of infection aside from exposure of the bone which is what I am primarily worried about as well.  Patient does have lower extremity swelling which is an acute onset after being started on antihypertensive medication.  Patient is scheduled to follow-up with primary care  physician for management of the fluid. -I will continue him on doxy Cipro to manage the infection for now.  Once his lower extremity has been medically optimized I will plan on taking him to the operating room especially if there is no improvement and worsening of the infection to the right side for amputation of the right great toe.  I discussed this with the patient extensive detail he is aware that patient may need an amputation to the right hallux. -Continue doing Betadine wet-to-dry dressing changes to both of the wounds.  Ulcer submetatarsal 2 left foot -Debridement as below. -Dressed with Santyl wet-to-dry dressings, DSD. -Continue off-loading with surgical shoe. -Another surgical shoe was dispensed -X-rays were reviewed and compared to prior films.  No new cortical irregularity or destruction noted.  I feel comfortable proceeding with floating osteotomy as discussed below. -I am also worried that if the end of taking the metatarsal head out he will end up  getting transfer metatarsalgia and another ulceration submet 3 or 4. -I believe patient is a ideal candidate for floating osteotomy of the second metatarsal to allow the body to find his natural position and properly heal with time.  I discussed this procedure in extensive detail with the patient.  I discussed all the risks and complication for this procedure with the patient.  Ultimately, the benefits and the risks were discussed with the patient.  If there is an exposure of the bone in near future without closure of the wound patient is likely going to lose the metatarsal head of the second.  Therefore I believe patient will benefit from this procedure as he cannot take time off of work and will need to continuously put pressure.  This osteotomy will allow him to put pressure with a surgical shoe and allow the bone to find his natural position and therefore reducing the pressure on the plantar forefoot. -ABIs PVRs were reviewed.  Good flow to  the lower extremity noted. -For now we will continue to do local wound care monitor for any clinical signs of infection.  I have asked to go to the emergency department if the symptoms gets worse or if the foot does not look good.  Patient states understanding and will do so.  Procedure: Excisional Debridement of Wound right hallux Rationale: Removal of non-viable soft tissue from the wound to promote healing.  Anesthesia: none Pre-Debridement Wound Measurements: 0.5 x 0.5 x 0.4 Post-Debridement Wound Measurements: Same Type of Debridement: Sharp Excisional Tissue Removed: Non-viable soft tissue Depth of Debridement: subcutaneous tissue. Technique: Sharp excisional debridement to bleeding, viable wound base.  Dressing: Dry, sterile, compression dressing. Disposition: Patient tolerated procedure well. Patient to return in 1 week for follow-up.    Procedure: Excisional Debridement of Wound left side Rationale: Removal of non-viable soft tissue from the wound to promote healing.  Anesthesia: none Pre-Debridement Wound Measurements: 2.5 cm x 2.5 cm x 0.5 centimeters. Post-Debridement Wound Measurements: Same Type of Debridement: Sharp Excisional Tissue Removed: Non-viable soft tissue Depth of Debridement: subcutaneous tissue. Technique: Sharp excisional debridement to bleeding, viable wound base.  Dressing: Dry, sterile, compression dressing. Disposition: Patient tolerated procedure well. Patient to return in 1 week for follow-up.  No follow-ups on file.

## 2019-12-30 NOTE — Assessment & Plan Note (Addendum)
Review recent notes of ER visit of initial finding and prior visit with PCP. Was tolerating dilt well. Tachycardia and irregular rhythm in office today. Patient endorses medication non-compliance with cardizem due to the belief it is exacerbating the swelling. Discussed this will control heart rate which should ideally be <100bpm.  -Continue cardizem 120 mg q24h

## 2019-12-30 NOTE — Assessment & Plan Note (Addendum)
Wt. Is up 20 lbs in < 1 month. Noticeable LE edema. Endorsing increasing SOB with ADLs. O2 sat in office appropriate. Tachycardic. No prior echo or BNP to compare. No known prior cardiac hx prior to this year. Patient believes swelling started with starting Cardizem.  -Referral to cardiololgy -obtain BNP -Start Lasix 20 mg daily; consider increasing while monitoring kidney function if appropriate -F/u w/ PCP scheduled

## 2019-12-31 ENCOUNTER — Telehealth: Payer: Self-pay | Admitting: Family Medicine

## 2019-12-31 NOTE — Telephone Encounter (Signed)
Called pt. LVM to call back for results. He has an elevated BNP which can indicate a fluid overloaded state.   Gerlene Fee, DO 12/31/2019, 5:21 PM PGY-2, [place]

## 2020-01-01 NOTE — Telephone Encounter (Signed)
Patient returned phone call to nurse line. Spoke with Dr. Janus Molder about patient. Informed of below information and to follow up with Cardiology once referral has been processed.   Talbot Grumbling, RN

## 2020-01-02 ENCOUNTER — Ambulatory Visit (INDEPENDENT_AMBULATORY_CARE_PROVIDER_SITE_OTHER): Payer: Medicare Other | Admitting: Podiatry

## 2020-01-02 ENCOUNTER — Other Ambulatory Visit: Payer: Self-pay

## 2020-01-02 DIAGNOSIS — L02611 Cutaneous abscess of right foot: Secondary | ICD-10-CM

## 2020-01-02 DIAGNOSIS — L97512 Non-pressure chronic ulcer of other part of right foot with fat layer exposed: Secondary | ICD-10-CM | POA: Diagnosis not present

## 2020-01-02 DIAGNOSIS — L03031 Cellulitis of right toe: Secondary | ICD-10-CM

## 2020-01-02 DIAGNOSIS — Z01818 Encounter for other preprocedural examination: Secondary | ICD-10-CM

## 2020-01-02 DIAGNOSIS — M216X2 Other acquired deformities of left foot: Secondary | ICD-10-CM | POA: Diagnosis not present

## 2020-01-02 MED ORDER — OXYCODONE-ACETAMINOPHEN 10-325 MG PO TABS: 1.0000 | ORAL_TABLET | Freq: Four times a day (QID) | ORAL | 0 refills | Status: AC | PRN

## 2020-01-05 ENCOUNTER — Telehealth: Payer: Self-pay | Admitting: Podiatry

## 2020-01-05 DIAGNOSIS — M21541 Acquired clubfoot, right foot: Secondary | ICD-10-CM | POA: Diagnosis not present

## 2020-01-05 DIAGNOSIS — M21542 Acquired clubfoot, left foot: Secondary | ICD-10-CM | POA: Diagnosis not present

## 2020-01-05 DIAGNOSIS — M216X2 Other acquired deformities of left foot: Secondary | ICD-10-CM | POA: Diagnosis not present

## 2020-01-05 DIAGNOSIS — M86171 Other acute osteomyelitis, right ankle and foot: Secondary | ICD-10-CM | POA: Diagnosis not present

## 2020-01-05 DIAGNOSIS — M86671 Other chronic osteomyelitis, right ankle and foot: Secondary | ICD-10-CM | POA: Diagnosis not present

## 2020-01-05 DIAGNOSIS — M898X7 Other specified disorders of bone, ankle and foot: Secondary | ICD-10-CM | POA: Diagnosis not present

## 2020-01-05 DIAGNOSIS — M86672 Other chronic osteomyelitis, left ankle and foot: Secondary | ICD-10-CM | POA: Diagnosis not present

## 2020-01-05 DIAGNOSIS — L97522 Non-pressure chronic ulcer of other part of left foot with fat layer exposed: Secondary | ICD-10-CM | POA: Diagnosis not present

## 2020-01-05 MED ORDER — OXYCODONE-ACETAMINOPHEN 10-325 MG PO TABS: 1.0000 | ORAL_TABLET | ORAL | 0 refills | Status: DC | PRN

## 2020-01-05 MED ORDER — OXYCODONE-ACETAMINOPHEN 10-325 MG PO TABS: 1.0000 | ORAL_TABLET | Freq: Three times a day (TID) | ORAL | 0 refills | Status: AC | PRN

## 2020-01-05 MED ORDER — OXYCODONE-ACETAMINOPHEN 10-325 MG PO TABS: 1.0000 | ORAL_TABLET | Freq: Four times a day (QID) | ORAL | 0 refills | Status: AC | PRN

## 2020-01-05 NOTE — Telephone Encounter (Signed)
I spoke with patient and [facility] on [address] will not fill his narcotic Rx, he received his Rx from last week and has been taking it not as prescribed and they will not refill it. They will refill it only with an increased frequency sig and a call from Dr Posey Pronto.

## 2020-01-06 ENCOUNTER — Encounter: Payer: Self-pay | Admitting: Podiatry

## 2020-01-06 NOTE — Progress Notes (Signed)
Subjective:  Patient ID: Thomas [name], male    DOB: Apr 28, 1950,  MRN: [id number]  Chief Complaint  Patient presents with  . Wound Check    Right hallux pain is 9/10 Left foot submit 2 open wound PT stated that it hurts to walk     69 y.o. male presents for wound care.  Patient presents follows up with left submetatarsal two chronic ulceration with plantarflexed second metatarsal.  Patient also has a wound to the right side that is worsening and leading to further infection.  At this time I will discuss with the patient surgical intervention to amputate the right hallux as well as perform an osteotomy on the left side to help heal of the ulceration.  Patient states understanding.  Review of Systems: Negative except as noted in the HPI. Denies N/V/F/Ch.  Past Medical History:  Diagnosis Date  . Cancer [facility])    prostate  . Claustrophobia    quite severe  . Hypertension   . Hypoglycemia    occ  . Left knee DJD    Xray 12/23/08  . Prostate cancer [facility])     Current Outpatient Medications:  .  ciprofloxacin (CIPRO) 500 MG tablet, Take 1 tablet (500 mg total) by mouth 2 (two) times daily., Disp: 20 tablet, Rfl: 0 .  ciprofloxacin (CIPRO) 500 MG tablet, Take 1 tablet (500 mg total) by mouth 2 (two) times daily., Disp: 20 tablet, Rfl: 0 .  collagenase (SANTYL) ointment, Apply 1 application topically daily., Disp: 15 g, Rfl: 0 .  diltiazem (CARDIZEM CD) 120 MG 24 hr capsule, Take 1 capsule (120 mg total) by mouth daily., Disp: 90 capsule, Rfl: 0 .  doxycycline (VIBRA-TABS) 100 MG tablet, Take 1 tablet (100 mg total) by mouth 2 (two) times daily., Disp: 20 tablet, Rfl: 0 .  ENBREL SURECLICK 50 MG/ML injection, Inject into the skin once a week. , Disp: , Rfl:  .  furosemide (LASIX) 20 MG tablet, Take 1 tablet (20 mg total) by mouth daily., Disp: 30 tablet, Rfl: 3 .  HYDROcodone-acetaminophen (NORCO) 10-325 MG tablet, Take 1 tablet by mouth 4 (four) times daily., Disp: , Rfl:  .  ibuprofen  (ADVIL) 800 MG tablet, Take 1 tablet (800 mg total) by mouth every 6 (six) hours as needed., Disp: 60 tablet, Rfl: 1 .  lisinopril (ZESTRIL) 20 MG tablet, TAKE 1 TABLET BY MOUTH EVERY DAY, Disp: 30 tablet, Rfl: 1 .  LORazepam (ATIVAN) 0.5 MG tablet, One 90 minutes prior to procedure and may repeat if needed 30 minutes prior to procedure.  MRI brain., Disp: 2 tablet, Rfl: 0 .  meloxicam (MOBIC) 15 MG tablet, TAKE 1 TABLET BY MOUTH EVERY DAY (Patient taking differently: Take 15 mg by mouth daily. ), Disp: 90 tablet, Rfl: 1 .  methotrexate (RHEUMATREX) 2.5 MG tablet, Take 20 mg by mouth once a week. , Disp: , Rfl:  .  NARCAN 4 MG/0.1ML LIQD nasal spray kit, Place into the nose as needed. , Disp: , Rfl:  .  oxyCODONE-acetaminophen (PERCOCET) 10-325 MG tablet, Take 1 tablet by mouth every 6 (six) hours as needed for up to 8 days for pain., Disp: 30 tablet, Rfl: 0 .  oxyCODONE-acetaminophen (PERCOCET) 10-325 MG tablet, Take 1 tablet by mouth every 6 (six) hours as needed for up to 8 days for pain., Disp: 30 tablet, Rfl: 0 .  oxyCODONE-acetaminophen (PERCOCET) 10-325 MG tablet, Take 1 tablet by mouth every 6 (six) hours as needed for up to 8 days for  pain., Disp: 30 tablet, Rfl: 0 .  oxyCODONE-acetaminophen (PERCOCET) 10-325 MG tablet, Take 1 tablet by mouth every 8 (eight) hours as needed for up to 5 days for pain., Disp: 15 tablet, Rfl: 0 .  oxyCODONE-acetaminophen (PERCOCET) 10-325 MG tablet, Take 1 tablet by mouth every 4 (four) hours as needed for pain., Disp: 30 tablet, Rfl: 0 .  tamsulosin (FLOMAX) 0.4 MG CAPS capsule, Take 1 capsule (0.4 mg total) by mouth daily., Disp: 30 capsule, Rfl: 11 .  testosterone cypionate (DEPOTESTOSTERONE CYPIONATE) 200 MG/ML injection, Inject 100 mg into the muscle every 14 (fourteen) days., Disp: , Rfl:   Social History   Tobacco Use  Smoking Status Never Smoker  Smokeless Tobacco Never Used    Allergies  Allergen Reactions  . Chlorthalidone Other (See Comments)      "Makes me light headed and I don't like the way it makes me feel"   Objective:   There were no vitals filed for this visit. There is no height or weight on file to calculate BMI. Constitutional Well developed. Well nourished.  Vascular Dorsalis pedis faintly pulses palpable bilaterally. Posterior tibial faintly pulses palpable bilaterally. Capillary refill normal to all digits.  No cyanosis or clubbing noted. Pedal hair growth normal.  Neurologic Normal speech. Oriented to person, place, and time. Protective sensation absent  Dermatologic Wound Location: Left submetatarsal 2 probes down to deep tissue no exposure of bone.  No purulent drainage noted.  No other clinical signs of infection noted. Wound Base: Mixed Granular/Fibrotic Peri-wound: Normal Exudate: Scant/small amount Serous exudate Wound Measurements: -See below  Right hallux ulceration with fibrogranular wound base probing down to bone with bone exposure of the head of the proximal phalanx.  No erythema or cellulitis present.  No purulent drainage was expressed.  Orthopedic: No pain to palpation either foot.   Radiographs: 3 view of skeletally mature adult foot: Calcification is noted at the posterior tibial artery.  Mild midfoot arthritis noted.  No cortical irregularity or destruction noted at the metatarsal head.  Mild midfoot arthritis noted.  No other bony abnormalities noted.  Right foot review of x-rays taken on 830 showed osteomyelitic changes to the head of the proximal phalanx consistent with osteomyelitic changes.  Bilateral vessel calcification noted as well to the lower extremity  Assessment:   1. Foot ulcer with fat layer exposed, right ([place])   2. Cellulitis and abscess of toe of right foot   3. Right foot ulcer, with fat layer exposed ([place])   4. Preoperative examination   5. Plantar flexed metatarsal bone of left foot    Plan:  Patient was evaluated and treated and all questions answered.  Right  hallux epidermal lysis with wound and exposed proximal phalanx -I explained to the patient the etiology of the wound formation with underlying osteomyelitis and various treatment options were extensively discussed.  At this time patient does not have any acute signs of infection aside from exposure of the bone which is what I am primarily worried about as well.  Patient does have lower extremity swelling which is an acute onset after being started on antihypertensive medication.  Patient is scheduled to follow-up with primary care physician for management of the fluid. -It appears that the wound has regressed and needing to furthering and worsening infection.  At this time patient will benefit from amputation of the hallux at the level of the metatarsophalangeal joint.  I discussed this with patient in extensive detail.  Patient states understanding.  I will  plan on taking him to the surgery center performing an amputation of the right hallux.  I discussed with him that he will have bilateral surgical shoe. -Informed surgical risk consent was reviewed and read aloud to the patient.  I reviewed the films.  I have discussed my findings with the patient in great detail.  I have discussed all risks including but not limited to infection, stiffness, scarring, limp, disability, deformity, damage to blood vessels and nerves, numbness, poor healing, need for braces, arthritis, chronic pain, amputation, death.  All benefits and realistic expectations discussed in great detail.  I have made no promises as to the outcome.  I have provided realistic expectations.  I have offered the patient a 2nd opinion, which they have declined and assured me they preferred to proceed despite the risks -A total of 46 minutes was spent in direct patient care as well as pre and post patient encounter activities.  This includes documentation as well as reviewing patient chart for labs, imaging, past medical, surgical, social, and family  history as documented in the EMR.  I have reviewed medication allergies as documented in EMR.  I discussed the etiology of condition and treatment options from conservative to surgical care.  All risks and benefit of the treatment course was discussed in detail.  All questions were answered and return appointment was discussed.  Since the visit completed in an ambulatory/outpatient setting, the patient and/or parent/guardian has been advised to contact the providers office for worsening condition and seek medical treatment and/or call 911 if the patient deems either is necessary.   Ulcer submetatarsal 2 left foot -Debridement as below. -Dressed with Santyl wet-to-dry dressings, DSD. -Continue off-loading with surgical shoe. -Another surgical shoe was dispensed -X-rays were reviewed and compared to prior films.  No new cortical irregularity or destruction noted.  I feel comfortable proceeding with floating osteotomy as discussed below. -I am also worried that if the end of taking the metatarsal head out he will end up getting transfer metatarsalgia and another ulceration submet 3 or 4. -I believe patient is a ideal candidate for floating osteotomy of the second metatarsal to allow the body to find his natural position and properly heal with time.  I discussed this procedure in extensive detail with the patient.  I discussed all the risks and complication for this procedure with the patient.  Ultimately, the benefits and the risks were discussed with the patient.  If there is an exposure of the bone in near future without closure of the wound patient is likely going to lose the metatarsal head of the second.  Therefore I believe patient will benefit from this procedure as he cannot take time off of work and will need to continuously put pressure.  This osteotomy will allow him to put pressure with a surgical shoe and allow the bone to find his natural position and therefore reducing the pressure on the  plantar forefoot. -ABIs PVRs were reviewed.  Good flow to the lower extremity noted. -For now we will continue to do local wound care monitor for any clinical signs of infection.  I have asked to go to the emergency department if the symptoms gets worse or if the foot does not look good.  Patient states understanding and will do so.  Procedure: Excisional Debridement of Wound right hallux~worsening Rationale: Removal of non-viable soft tissue from the wound to promote healing.  Anesthesia: none Pre-Debridement Wound Measurements: 0.5 x 0.5 x 0.4 Post-Debridement Wound Measurements: Same Type of  Debridement: Sharp Excisional Tissue Removed: Non-viable soft tissue Depth of Debridement: subcutaneous tissue. Technique: Sharp excisional debridement to bleeding, viable wound base.  Dressing: Dry, sterile, compression dressing. Disposition: Patient tolerated procedure well. Patient to return in 1 week for follow-up.    Procedure: Excisional Debridement of Wound left side~stagnant Rationale: Removal of non-viable soft tissue from the wound to promote healing.  Anesthesia: none Pre-Debridement Wound Measurements: 2.5 cm x 2.5 cm x 0.5 centimeters. Post-Debridement Wound Measurements: Same Type of Debridement: Sharp Excisional Tissue Removed: Non-viable soft tissue Depth of Debridement: subcutaneous tissue. Technique: Sharp excisional debridement to bleeding, viable wound base.  Dressing: Dry, sterile, compression dressing. Disposition: Patient tolerated procedure well. Patient to return in 1 week for follow-up.  No follow-ups on file.

## 2020-01-07 DIAGNOSIS — G894 Chronic pain syndrome: Secondary | ICD-10-CM | POA: Diagnosis not present

## 2020-01-07 DIAGNOSIS — Z79899 Other long term (current) drug therapy: Secondary | ICD-10-CM | POA: Diagnosis not present

## 2020-01-07 DIAGNOSIS — M25551 Pain in right hip: Secondary | ICD-10-CM | POA: Diagnosis not present

## 2020-01-07 DIAGNOSIS — M79673 Pain in unspecified foot: Secondary | ICD-10-CM | POA: Diagnosis not present

## 2020-01-07 DIAGNOSIS — M069 Rheumatoid arthritis, unspecified: Secondary | ICD-10-CM | POA: Diagnosis not present

## 2020-01-07 DIAGNOSIS — Z79891 Long term (current) use of opiate analgesic: Secondary | ICD-10-CM | POA: Diagnosis not present

## 2020-01-08 ENCOUNTER — Encounter: Payer: Self-pay | Admitting: Podiatry

## 2020-01-08 ENCOUNTER — Ambulatory Visit: Payer: Medicare Other | Admitting: Podiatry

## 2020-01-08 ENCOUNTER — Other Ambulatory Visit: Payer: Self-pay

## 2020-01-08 DIAGNOSIS — L97512 Non-pressure chronic ulcer of other part of right foot with fat layer exposed: Secondary | ICD-10-CM

## 2020-01-08 NOTE — Progress Notes (Signed)
Patient is here for dressing change.  No acute complaints.  He was bleeding through however the dried blood was present.  At this time no concern for active bleed noted.  The outer bandages were changed.  The sterile internal bandages were kept intact.

## 2020-01-09 ENCOUNTER — Other Ambulatory Visit: Payer: Self-pay | Admitting: Podiatry

## 2020-01-11 NOTE — Telephone Encounter (Signed)
Please advise 

## 2020-01-14 ENCOUNTER — Encounter: Payer: Self-pay | Admitting: Podiatry

## 2020-01-14 ENCOUNTER — Telehealth: Payer: Self-pay | Admitting: *Deleted

## 2020-01-14 ENCOUNTER — Ambulatory Visit: Payer: Medicare Other | Admitting: Podiatry

## 2020-01-14 ENCOUNTER — Ambulatory Visit (INDEPENDENT_AMBULATORY_CARE_PROVIDER_SITE_OTHER): Payer: Medicare Other

## 2020-01-14 ENCOUNTER — Other Ambulatory Visit: Payer: Self-pay

## 2020-01-14 DIAGNOSIS — M79672 Pain in left foot: Secondary | ICD-10-CM

## 2020-01-14 DIAGNOSIS — Z89411 Acquired absence of right great toe: Secondary | ICD-10-CM | POA: Diagnosis not present

## 2020-01-14 DIAGNOSIS — M79671 Pain in right foot: Secondary | ICD-10-CM

## 2020-01-14 DIAGNOSIS — Z9889 Other specified postprocedural states: Secondary | ICD-10-CM

## 2020-01-14 DIAGNOSIS — L97512 Non-pressure chronic ulcer of other part of right foot with fat layer exposed: Secondary | ICD-10-CM

## 2020-01-14 MED ORDER — HYDROCODONE-ACETAMINOPHEN 10-325 MG PO TABS: 1.0000 | ORAL_TABLET | Freq: Three times a day (TID) | ORAL | 0 refills | Status: AC | PRN

## 2020-01-14 NOTE — Telephone Encounter (Signed)
Patient called asking if he could remove his bandage and let it get air.

## 2020-01-14 NOTE — Telephone Encounter (Signed)
Called patient and left message that he needs to leave bandage on and foot covered until otherwise instructed.

## 2020-01-14 NOTE — Progress Notes (Signed)
Subjective:  Patient ID: Thomas [name], male    DOB: 02/08/1951,  MRN: [id number]  Chief Complaint  Patient presents with  . Routine Post Op    POV#1 DOS 9.13.2021 LT END METATARSAL OSTEOTOMY, RT HALLUX AMPUTATION. Pt denies fever/nausea/vomiting/chills.    69 y.o. male returns for post-op check.  Patient states he is doing well.  He has been keeping the bandages clean dry and intact.  He has been ambulating with bilateral surgical shoe.  He denies any other acute complaints.  He states that there is a little smell to it from not getting it cleaned.  He denies any other acute complaints  Review of Systems: Negative except as noted in the HPI. Denies N/V/F/Ch.  Past Medical History:  Diagnosis Date  . Cancer [facility])    prostate  . Claustrophobia    quite severe  . Hypertension   . Hypoglycemia    occ  . Left knee DJD    Xray 12/23/08  . Prostate cancer [facility])     Current Outpatient Medications:  .  ciprofloxacin (CIPRO) 500 MG tablet, Take 1 tablet (500 mg total) by mouth 2 (two) times daily., Disp: 20 tablet, Rfl: 0 .  ciprofloxacin (CIPRO) 500 MG tablet, TAKE 1 TABLET BY MOUTH TWICE A DAY, Disp: 20 tablet, Rfl: 0 .  collagenase (SANTYL) ointment, Apply 1 application topically daily., Disp: 15 g, Rfl: 0 .  diltiazem (CARDIZEM CD) 120 MG 24 hr capsule, Take 1 capsule (120 mg total) by mouth daily., Disp: 90 capsule, Rfl: 0 .  doxycycline (VIBRA-TABS) 100 MG tablet, Take 1 tablet (100 mg total) by mouth 2 (two) times daily., Disp: 20 tablet, Rfl: 0 .  ENBREL SURECLICK 50 MG/ML injection, Inject into the skin once a week. , Disp: , Rfl:  .  furosemide (LASIX) 20 MG tablet, Take 1 tablet (20 mg total) by mouth daily., Disp: 30 tablet, Rfl: 3 .  HYDROcodone-acetaminophen (NORCO) 10-325 MG tablet, Take 1 tablet by mouth 4 (four) times daily., Disp: , Rfl:  .  ibuprofen (ADVIL) 800 MG tablet, Take 1 tablet (800 mg total) by mouth every 6 (six) hours as needed., Disp: 60 tablet, Rfl: 1 .   lisinopril (ZESTRIL) 20 MG tablet, TAKE 1 TABLET BY MOUTH EVERY DAY, Disp: 30 tablet, Rfl: 1 .  LORazepam (ATIVAN) 0.5 MG tablet, One 90 minutes prior to procedure and may repeat if needed 30 minutes prior to procedure.  MRI brain., Disp: 2 tablet, Rfl: 0 .  meloxicam (MOBIC) 15 MG tablet, TAKE 1 TABLET BY MOUTH EVERY DAY (Patient taking differently: Take 15 mg by mouth daily. ), Disp: 90 tablet, Rfl: 1 .  methotrexate (RHEUMATREX) 2.5 MG tablet, Take 20 mg by mouth once a week. , Disp: , Rfl:  .  NARCAN 4 MG/0.1ML LIQD nasal spray kit, Place into the nose as needed. , Disp: , Rfl:  .  oxyCODONE-acetaminophen (PERCOCET) 10-325 MG tablet, Take 1 tablet by mouth every 4 (four) hours as needed for pain., Disp: 30 tablet, Rfl: 0 .  tamsulosin (FLOMAX) 0.4 MG CAPS capsule, Take 1 capsule (0.4 mg total) by mouth daily., Disp: 30 capsule, Rfl: 11 .  testosterone cypionate (DEPOTESTOSTERONE CYPIONATE) 200 MG/ML injection, Inject 100 mg into the muscle every 14 (fourteen) days., Disp: , Rfl:  .  HYDROcodone-acetaminophen (NORCO) 10-325 MG tablet, Take 1 tablet by mouth every 8 (eight) hours as needed for up to 5 days., Disp: 15 tablet, Rfl: 0  Social History   Tobacco Use  Smoking Status Never Smoker  Smokeless Tobacco Never Used    Allergies  Allergen Reactions  . Chlorthalidone Other (See Comments)    "Makes me light headed and I don't like the way it makes me feel"   Objective:  There were no vitals filed for this visit. There is no height or weight on file to calculate BMI. Constitutional Well developed. Well nourished.  Vascular Foot warm and well perfused. Capillary refill normal to all digits.   Neurologic Normal speech. Oriented to person, place, and time. Epicritic sensation to light touch grossly present bilaterally.  Dermatologic Skin healing well without signs of infection. Skin edges well coapted without signs of infection.  Orthopedic: Tenderness to palpation noted about the  surgical site.   Radiographs: 3 views of skeletally mature adult bilateral foot: Hallux amputation site noted to the right side.  No signs of osteomyelitis noted.  Left second metatarsal osteotomy noted appears to be floating well.  Appears to be in good correction alignment. Assessment:   1. Pain in right foot   2. Pain in left foot   3. Right foot ulcer, with fat layer exposed ([place])   4. Foot ulcer with fat layer exposed, right ([place])   5. Status post foot surgery   6. Status post amputation of great toe, right [facility])    Plan:  Patient was evaluated and treated and all questions answered.  S/p foot surgery bilaterally -Progressing as expected post-operatively. -XR: See above -WB Status: Weightbearing as tolerated in bilateral surgical shoe -Sutures: Intact on the left side.  Mild superficial dehiscence noted to the right side.  No clinical signs of infection noted. -Medications: Vicodin/Norco -Foot redressed with Betadine wet-to-dry dressings  No follow-ups on file.

## 2020-01-21 ENCOUNTER — Encounter: Payer: Medicare Other | Admitting: Podiatry

## 2020-01-21 ENCOUNTER — Other Ambulatory Visit: Payer: Self-pay

## 2020-01-21 ENCOUNTER — Telehealth: Payer: Self-pay

## 2020-01-21 ENCOUNTER — Encounter: Payer: Self-pay | Admitting: Family Medicine

## 2020-01-21 ENCOUNTER — Ambulatory Visit (INDEPENDENT_AMBULATORY_CARE_PROVIDER_SITE_OTHER): Payer: Medicare Other | Admitting: Family Medicine

## 2020-01-21 DIAGNOSIS — I471 Supraventricular tachycardia: Secondary | ICD-10-CM | POA: Diagnosis not present

## 2020-01-21 DIAGNOSIS — R600 Localized edema: Secondary | ICD-10-CM | POA: Diagnosis not present

## 2020-01-21 DIAGNOSIS — I10 Essential (primary) hypertension: Secondary | ICD-10-CM | POA: Diagnosis not present

## 2020-01-21 MED ORDER — METOPROLOL SUCCINATE ER 25 MG PO TB24: 25.0000 mg | ORAL_TABLET | Freq: Every day | ORAL | 0 refills | Status: DC

## 2020-01-21 NOTE — Progress Notes (Signed)
    SUBJECTIVE:   CHIEF COMPLAINT / HPI:   [place] Not taking any Cardizem - convinced is causing the swelling.  No chest pain or shortness of breath but does feel tired  [place] Taking one lasix per day - 20 mg.   At first seemed to help but not over the last week.  Does feel his breathing is better.  Is taking lisinopril 40 mg daily instead of 20 mg as ordered  FEET Sp operation on both feet.  Is on antibiotics   PERTINENT  PMH / PSH: Agoraphobia. 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 Lungs are clear to auscultation, no crackles or wheezes. Heart - tachy regular rate Extrem - 3+ edema at mid shin.  Both feet bandaged  ASSESSMENT/PLAN:   Atrial tachycardia (HCC) Worsened.  He is convinced cardizem is bad for him and has not taken.  Will start low dose metoprolol, increase lasix, decrease lisinopril and check labs.   Has cardiology appointment in 10 days.  Will hopefully get echo there.     Hypertension Low today.  Decrease lisinopril since adding metroprolol and increasing diuretics   Lower extremity edema Worsened.  With elevated BNP likely due to heart failure.  His chronic tachycardia also contributing.  Start low dose metoprolol since he will not take cardizem.  Increase lasix and encourage compression stockings.  Needs echo    Will contact with lab results to adjust medications and set follow up appointment   [name], [place]

## 2020-01-21 NOTE — Assessment & Plan Note (Signed)
Worsened.  He is convinced cardizem is bad for him and has not taken.  Will start low dose metoprolol, increase lasix, decrease lisinopril and check labs.   Has cardiology appointment in 10 days.  Will hopefully get echo there.

## 2020-01-21 NOTE — Assessment & Plan Note (Signed)
Worsened.  With elevated BNP likely due to heart failure.  His chronic tachycardia also contributing.  Start low dose metoprolol since he will not take cardizem.  Increase lasix and encourage compression stockings.  Needs echo

## 2020-01-21 NOTE — Patient Instructions (Addendum)
Good to see you today!  Thanks for coming in.  For the Swelling - I think this is partially due to your too fast heart rate - To slow this down would start Metoprolol 25 mg once every morning.   - If you suddenly feel very lightheaded or like passing out then call us  - to help with the swelling in the legs - get support/compression stockings above your knee.  Wear those all the time when you are awake  - Keep elevating your legs several times a day  - Increase the lasix to two pills every morning   - For this lisinopril take 20 mg every day not the 40 mg   - Stop your ibuprofen - this can cause swelling  I will call you if your tests are not good.  Otherwise, I will send you a message on MyChart (if it is active) or a letter in the mail..  If you do not hear from me with in 2 weeks please call our office.     Keep your cardiology appointment on October 11

## 2020-01-21 NOTE — Assessment & Plan Note (Signed)
Low today.  Decrease lisinopril since adding metroprolol and increasing diuretics

## 2020-01-21 NOTE — Telephone Encounter (Signed)
Pt has an appointment next week, but would like to know if he could go back to work. Pt would also like a refill on his meds.

## 2020-01-21 NOTE — Telephone Encounter (Signed)
Patient calls nurse line due to concern for swelling in legs. Patient reports that he was told to increase lasix from 1 pill to 2 pills daily. Verified with office visit note from today. Patient reports that in the past, lasix worked much quicker, and he is concerned because his legs are still very "tight" and swollen. Advised patient that it can take time for oral medications to produce desired effects. Patient denies [facility] and is able to speak in complete sentences. Encouraged patient to follow provider's directions in elevating legs and wearing compression stockings while awake.   Patient is still worried and would like to speak with Dr. Erin Hearing as soon as possible.    ED/UC precautions given.   Talbot Grumbling, RN

## 2020-01-22 LAB — CMP14+EGFR
ALT: 13 IU/L (ref 0–44)
AST: 20 IU/L (ref 0–40)
Albumin/Globulin Ratio: 1.8 (ref 1.2–2.2)
Albumin: 3.6 g/dL — ABNORMAL LOW (ref 3.8–4.8)
Alkaline Phosphatase: 97 IU/L (ref 44–121)
BUN/Creatinine Ratio: 22 (ref 10–24)
BUN: 22 mg/dL (ref 8–27)
Bilirubin Total: 0.5 mg/dL (ref 0.0–1.2)
CO2: 24 mmol/L (ref 20–29)
Calcium: 9 mg/dL (ref 8.6–10.2)
Chloride: 101 mmol/L (ref 96–106)
Creatinine, Ser: 0.99 mg/dL (ref 0.76–1.27)
GFR calc Af Amer: 91 mL/min/{1.73_m2} (ref >59)
GFR calc non Af Amer: 78 mL/min/{1.73_m2} (ref >59)
Globulin, Total: 2 g/dL (ref 1.5–4.5)
Glucose: 83 mg/dL (ref 65–99)
Potassium: 4.2 mmol/L (ref 3.5–5.2)
Sodium: 140 mmol/L (ref 134–144)
Total Protein: 5.6 g/dL — ABNORMAL LOW (ref 6.0–8.5)

## 2020-01-22 MED ORDER — OXYCODONE-ACETAMINOPHEN 10-325 MG PO TABS: 1.0000 | ORAL_TABLET | Freq: Three times a day (TID) | ORAL | 0 refills | Status: AC | PRN

## 2020-01-22 NOTE — Telephone Encounter (Signed)
Left VM that I tried to call and he can call back with times for me to get back with him

## 2020-01-22 NOTE — Telephone Encounter (Signed)
Not until I see him and I will send medication to his pharmacy

## 2020-01-22 NOTE — Telephone Encounter (Signed)
Pt called again today and stated that the oxycodone (percocet) will not be available for pick up until October 14. Pt would prefer hydrocodone (norco) due to bad side effects with the oxycodone. Pt would like this called into the [facility] on [address].  Please advise.

## 2020-01-23 ENCOUNTER — Other Ambulatory Visit: Payer: Self-pay | Admitting: Podiatry

## 2020-01-23 DIAGNOSIS — I4892 Unspecified atrial flutter: Secondary | ICD-10-CM

## 2020-01-23 HISTORY — DX: Unspecified atrial flutter: I48.92

## 2020-01-23 MED ORDER — HYDROCODONE-ACETAMINOPHEN 10-325 MG PO TABS
1.0000 | ORAL_TABLET | Freq: Four times a day (QID) | ORAL | 0 refills | Status: DC | PRN
Start: 2020-01-23 — End: 2020-02-02

## 2020-01-23 NOTE — Telephone Encounter (Signed)
He is feeling ok No significant lightheadness or any chest pain Not urinating much Is taking metoprolol  Is taking two 20 mg lasix Has not gotten compression stockings but ar coming  Recommend Find his blood pressure cuff and record his blood pressure and pulse Let me know next week If no increased urination with compression stockigns then go up to three 20 mg lasix pe day If feels very lighthead or any chest pain should be seen He agrees

## 2020-01-23 NOTE — Telephone Encounter (Signed)
Please Advise

## 2020-01-26 DIAGNOSIS — Z20828 Contact with and (suspected) exposure to other viral communicable diseases: Secondary | ICD-10-CM | POA: Diagnosis not present

## 2020-01-28 ENCOUNTER — Ambulatory Visit (INDEPENDENT_AMBULATORY_CARE_PROVIDER_SITE_OTHER): Payer: Medicare Other | Admitting: Podiatry

## 2020-01-28 ENCOUNTER — Other Ambulatory Visit: Payer: Self-pay

## 2020-01-28 DIAGNOSIS — Z89411 Acquired absence of right great toe: Secondary | ICD-10-CM

## 2020-01-28 DIAGNOSIS — L97512 Non-pressure chronic ulcer of other part of right foot with fat layer exposed: Secondary | ICD-10-CM

## 2020-01-28 MED ORDER — HYDROCODONE-ACETAMINOPHEN 10-325 MG PO TABS
1.0000 | ORAL_TABLET | ORAL | 0 refills | Status: DC | PRN
Start: 2020-01-28 — End: 2020-02-02

## 2020-01-29 ENCOUNTER — Encounter: Payer: Self-pay | Admitting: Podiatry

## 2020-01-29 DIAGNOSIS — H6122 Impacted cerumen, left ear: Secondary | ICD-10-CM | POA: Diagnosis not present

## 2020-01-29 NOTE — Progress Notes (Signed)
Subjective:  Patient ID: Thomas [name], male    DOB: [birth year],  MRN: [id number]  No chief complaint on file.   69 y.o. male returns for post-op check.  Patient states he is doing well.  He has been keeping the bandages clean dry and intact.  He has been ambulating with bilateral surgical shoe.  He denies any other acute complaints.  He states that his wife left a job site had to go back to work.  So he has been little bit noncompliant to his foot.  Review of Systems: Negative except as noted in the HPI. Denies N/V/F/Ch.  Past Medical History:  Diagnosis Date  . Cancer [facility])    prostate  . Claustrophobia    quite severe  . Hypertension   . Hypoglycemia    occ  . Left knee DJD    Xray 12/23/08  . Prostate cancer [facility])     Current Outpatient Medications:  .  ciprofloxacin (CIPRO) 500 MG tablet, Take 1 tablet (500 mg total) by mouth 2 (two) times daily., Disp: 20 tablet, Rfl: 0 .  ciprofloxacin (CIPRO) 500 MG tablet, TAKE 1 TABLET BY MOUTH TWICE A DAY, Disp: 20 tablet, Rfl: 0 .  collagenase (SANTYL) ointment, Apply 1 application topically daily., Disp: 15 g, Rfl: 0 .  doxycycline (VIBRA-TABS) 100 MG tablet, Take 1 tablet (100 mg total) by mouth 2 (two) times daily., Disp: 20 tablet, Rfl: 0 .  ENBREL SURECLICK 50 MG/ML injection, Inject into the skin once a week. , Disp: , Rfl:  .  furosemide (LASIX) 20 MG tablet, Take 2 tablets (40 mg total) by mouth daily., Disp: 30 tablet, Rfl: 3 .  HYDROcodone-acetaminophen (NORCO) 10-325 MG tablet, Take 1 tablet by mouth every 6 (six) hours as needed., Disp: 30 tablet, Rfl: 0 .  HYDROcodone-acetaminophen (NORCO) 10-325 MG tablet, Take 1 tablet by mouth every 4 (four) hours as needed., Disp: 30 tablet, Rfl: 0 .  ibuprofen (ADVIL) 800 MG tablet, Take 1 tablet (800 mg total) by mouth every 6 (six) hours as needed., Disp: 60 tablet, Rfl: 1 .  lisinopril (ZESTRIL) 20 MG tablet, TAKE 1 TABLET BY MOUTH EVERY DAY, Disp: 30 tablet, Rfl: 1 .  LORazepam  (ATIVAN) 0.5 MG tablet, One 90 minutes prior to procedure and may repeat if needed 30 minutes prior to procedure.  MRI brain., Disp: 2 tablet, Rfl: 0 .  meloxicam (MOBIC) 15 MG tablet, TAKE 1 TABLET BY MOUTH EVERY DAY (Patient taking differently: Take 15 mg by mouth daily. ), Disp: 90 tablet, Rfl: 1 .  methotrexate (RHEUMATREX) 2.5 MG tablet, Take 20 mg by mouth once a week. , Disp: , Rfl:  .  metoprolol succinate (TOPROL-XL) 25 MG 24 hr tablet, Take 1 tablet (25 mg total) by mouth daily., Disp: 30 tablet, Rfl: 0 .  NARCAN 4 MG/0.1ML LIQD nasal spray kit, Place into the nose as needed. , Disp: , Rfl:  .  oxyCODONE-acetaminophen (PERCOCET) 10-325 MG tablet, Take 1 tablet by mouth every 4 (four) hours as needed for pain., Disp: 30 tablet, Rfl: 0 .  tamsulosin (FLOMAX) 0.4 MG CAPS capsule, Take 1 capsule (0.4 mg total) by mouth daily., Disp: 30 capsule, Rfl: 11 .  testosterone cypionate (DEPOTESTOSTERONE CYPIONATE) 200 MG/ML injection, Inject 100 mg into the muscle every 14 (fourteen) days., Disp: , Rfl:   Social History   Tobacco Use  Smoking Status Never Smoker  Smokeless Tobacco Never Used    Allergies  Allergen Reactions  . Chlorthalidone Other (See  Comments)    "Makes me light headed and I don't like the way it makes me feel"   Objective:  There were no vitals filed for this visit. There is no height or weight on file to calculate BMI. Constitutional Well developed. Well nourished.  Vascular Foot warm and well perfused. Capillary refill normal to all digits.   Neurologic Normal speech. Oriented to person, place, and time. Epicritic sensation to light touch grossly present bilaterally.  Dermatologic Skin healing well without signs of infection. Skin edges well coapted without signs of infection.  Wound Location: Right hallux amputation site Wound Base: Mixed Granular/Fibrotic Peri-wound: Calloused Exudate: Scant/small amount Serosanguinous exudate  Orthopedic: Tenderness to  palpation noted about the surgical site.   Radiographs: 3 views of skeletally mature adult bilateral foot: Hallux amputation site noted to the right side.  No signs of osteomyelitis noted.  Left second metatarsal osteotomy noted appears to be floating well.  Appears to be in good correction alignment. Assessment:   No diagnosis found. Plan:  Patient was evaluated and treated and all questions answered.  S/p foot surgery bilaterally -Progressing as expected post-operatively. -XR: See above -WB Status: Weightbearing as tolerated in bilateral surgical shoe -Sutures: Intact on the left side.  Mild superficial dehiscence noted to the right side.  No clinical signs of infection noted. -Medications: Vicodin/Norco -Foot redressed with Betadine wet-to-dry dressings  Left submetatarsal two wound limited to the breakdown of the skin -This appears to be clinically resolving.  The wound has gotten considerably smaller.  I discussed with him to continue offloading with surgical shoe.  Patient states understanding.   Ulcer right foot also with fat layer exposed status post dehiscence from hallux amputation -Debridement as below. -Dressed with Betadine wet-to-dry, DSD. -Continue off-loading with surgical shoe.  Procedure: Excisional Debridement of Wound Tool: Sharp chisel blade/tissue nipper Rationale: Removal of non-viable soft tissue from the wound to promote healing.  Anesthesia: none Pre-Debridement Wound Measurements: 3.0 cm x 1.5 cm x 0.4 cm  Post-Debridement Wound Measurements: 3.2 cm x 1.4 cm x 0.4 cm  Type of Debridement: Sharp Excisional Tissue Removed: Non-viable soft tissue Blood loss: Minimal (<50cc) Depth of Debridement: subcutaneous tissue. Technique: Sharp excisional debridement to bleeding, viable wound base.  Wound Progress: I will continue to monitor progression of the wound as this is my initial evaluation. Site healing conversation 7 Dressing: Dry, sterile, compression  dressing. Disposition: Patient tolerated procedure well. Patient to return in 1 week for follow-up.  No follow-ups on file.     No follow-ups on file.

## 2020-02-01 ENCOUNTER — Other Ambulatory Visit: Payer: Self-pay | Admitting: Family Medicine

## 2020-02-01 NOTE — Progress Notes (Signed)
Cardiology Office Note:    Date:  02/02/2020   ID:  [name], DOB 1951/03/09, MRN [id number]  PCP:  [name], MD  [place] Cardiologist:  No primary care provider on file.  [facility] Electrophysiologist:  None   Referring MD: Kinnie Feil, MD     History of Present Illness:    Thomas [name] is a 69 y.o. male with a hx of HTN, prostate cancer, and atrial flutter who was referred by Dr. Gwendlyn Deutscher for evaluation of aflutter and worsening LE edema.  Dr. Erin Hearing note dated 01/21/20 reviewed. Patient with  history of atrial flutter with RVR for which he was previously on cardizem. He stopped the medication due to concern that it was making his LE edema worse. Now transitioned to metoprolol. BNP checked by PCP and came back elevated at 901.9. Now referred to Cardiology for concern for heart failure as well as aflutter.  Patient states that he has been feeling more SOB. Has been ongoing for 3-4weeks and is getting progressively worse. Has been taking lasix 87m daily which has helped a little bit. Urinates a significant amount after taking the lasix. Believes the diltiazem made worse.   Blood pressures at home 120s/80s and HR 120s at home.   Of note, patient has had multiple ED visits for Aflutter with RVR. Not placed on AC at that time due to concern of falls. Left AMA during one visit prior to full work-up. Has not had TTE.  Labs: BNP 909, Cr 0.77, K 4.0, AST 23, ALT 20, Mg 1.9  Past Medical History:  Diagnosis Date  . Cancer ([facility]    prostate  . Claustrophobia    quite severe  . Hypertension   . Hypoglycemia    occ  . Left knee DJD    Xray 12/23/08  . Prostate cancer ([facility]     Past Surgical History:  Procedure Laterality Date  . CYSTOSCOPY  04/11/2018   Procedure: CYSTOSCOPY FLEXIBLE;  Surgeon: HArdis Hughs MD;  [facility]  Service: Urology;;  NO SEEDS FOUND IN BLADDER  . HERNIA REPAIR  2009   inguinal  .  JOINT REPLACEMENT Bilateral 2017   knees  . RADIOACTIVE SEED IMPLANT N/A 04/11/2018   Procedure: RADIOACTIVE SEED IMPLANT/BRACHYTHERAPY IMPLANT;  Surgeon: HArdis Hughs MD;  [facility]  Service: Urology;  Laterality: N/A;   69     SEEDS IMPLANTED  . SPACE OAR INSTILLATION N/A 04/11/2018   Procedure: SPACE OAR INSTILLATION;  Surgeon: HArdis Hughs MD;  [facility]  Service: Urology;  Laterality: N/A;  . TOTAL KNEE ARTHROPLASTY Bilateral 09/13/2015   Procedure: BILATERAL KNEE ARTHROPLASTY ;  Surgeon: MParalee Cancel MD;  Location: [facility];  Service: Orthopedics;  Laterality: Bilateral;    Current Medications: Current Meds  Medication Sig  . ENBREL SURECLICK 50 MG/ML injection Inject into the skin once a week.   . furosemide (LASIX) 20 MG tablet Take 2 tablets (40 mg total) by mouth daily.  .Marland Kitchenibuprofen (ADVIL) 800 MG tablet Take 1 tablet (800 mg total) by mouth every 6 (six) hours as needed.  . methotrexate (RHEUMATREX) 2.5 MG tablet Take 20 mg by mouth once a week.   . metoprolol succinate (TOPROL-XL) 25 MG 24 hr tablet Take 1 tablet (25 mg total) by mouth daily.  .Marland KitchenNARCAN 4 MG/0.1ML LIQD nasal spray kit Place into the nose as needed.   .Marland KitchenoxyCODONE-acetaminophen (PERCOCET) 10-325 MG  tablet Take 1 tablet by mouth every 4 (four) hours as needed for pain.  . tamsulosin (FLOMAX) 0.4 MG CAPS capsule Take 1 capsule (0.4 mg total) by mouth daily.  Marland Kitchen testosterone cypionate (DEPOTESTOSTERONE CYPIONATE) 200 MG/ML injection Inject 100 mg into the muscle every 14 (fourteen) days.     Allergies:   Chlorthalidone   Social History   Socioeconomic History  . Marital status: Married    Spouse name: Not on file  . Number of children: Not on file  . Years of education: Not on file  . Highest education level: Not on file  Occupational History  . Not on file  Tobacco Use  . Smoking status: Never Smoker  . Smokeless tobacco: Never Used   Vaping Use  . Vaping Use: Never used  Substance and Sexual Activity  . Alcohol use: No    Alcohol/week: 0.0 standard drinks  . Drug use: Not Currently  . Sexual activity: Yes  Other Topics Concern  . Not on file  Social History Narrative  . Not on file   Social Determinants of Health   Financial Resource Strain:   . Difficulty of Paying Living Expenses: Not on file  Food Insecurity:   . Worried About Charity fundraiser in the Last Year: Not on file  . Ran Out of Food in the Last Year: Not on file  Transportation Needs:   . Lack of Transportation (Medical): Not on file  . Lack of Transportation (Non-Medical): Not on file  Physical Activity:   . Days of Exercise per Week: Not on file  . Minutes of Exercise per Session: Not on file  Stress:   . Feeling of Stress : Not on file  Social Connections:   . Frequency of Communication with Friends and Family: Not on file  . Frequency of Social Gatherings with Friends and Family: Not on file  . Attends Religious Services: Not on file  . Active Member of Clubs or Organizations: Not on file  . Attends Archivist Meetings: Not on file  . Marital Status: Not on file     Family History: The patient's family history includes Prostate cancer in his brother and father. There is no history of Colon cancer, Rectal cancer, or Stomach cancer.  ROS:   Please see the history of present illness.    Notable for SOB with minimal exertion, orthopnea, bendopnea, PND, LE edema.  No chest pressure, palpitations, fevers, chills, GI bleeding, hematuria, abdominal pain  EKGs/Labs/Other Studies Reviewed:    The following studies were reviewed today: 07/2019 ABIs: Summary:  Right: Resting right ankle-brachial index is within normal range.  No evidence of significant right lower extremity arterial disease. The  right toe-brachial index is normal.   Left: Resting left ankle-brachial index is within normal range.  No evidence of significant  left lower extremity arterial disease. The left  toe-brachial index is normal.   EKG:  EKG is  ordered today.  The ekg ordered today demonstrates Aflutter with HR 125  Recent Labs: 11/26/2019: Hemoglobin 14.0; Magnesium 1.9; Platelets 325; TSH 0.816 12/25/2019: BNP 901.9 01/21/2020: ALT 13; BUN 22; Creatinine, Ser 0.99; Potassium 4.2; Sodium 140  Recent Lipid Panel    Component Value Date/Time   CHOL 161 10/28/2015 0953   TRIG 158 (H) 10/28/2015 0953   HDL 41 10/28/2015 0953   CHOLHDL 3.9 10/28/2015 0953   VLDL 32 (H) 10/28/2015 0953   LDLCALC 88 10/28/2015 0953   LDLDIRECT 94 12/06/2010 0917  Risk Assessment/Calculations:     CHA2DS2-VASc Score = 2  This indicates a 2.2% annual risk of stroke. The patient's score is based upon: CHF History: 0 HTN History: 1 Diabetes History: 0 Stroke History: 0 Vascular Disease History: 0 Age Score: 1 Gender Score: 0      Physical Exam:    VS:  BP 100/80   Pulse (!) 125   Ht _0  (1.93 m)   Wt 220 lb (99.8 kg)   SpO2 97%   BMI 26.78 kg/m     Wt Readings from Last 3 Encounters:  02/02/20 220 lb (99.8 kg)  01/21/20 217 lb 9.6 oz (98.7 kg)  12/25/19 218 lb 3.2 oz (99 kg)     GEN: Comfortable but SOB with speaking HEENT: Normal NECK: +JVD to mid-neck when sitting at 90 degrees, No carotid bruits LYMPHATICS: No lymphadenopathy CARDIAC: Tachycardic, regular, no murmurs RESPIRATORY:  Clear to auscultation without rales, wheezing or rhonchi  ABDOMEN: Soft, non-tender, non-distended MUSCULOSKELETAL:  2+ tense pitting edema to the thighs. Warm. SKIN: Warm  NEUROLOGIC:  Alert and oriented x 3 PSYCHIATRIC:  Normal affect   ASSESSMENT:    1. Atrial flutter, unspecified type ([place])   2. Lower extremity edema   3. Heart failure, type unknown ([place])   4. Shortness of breath    PLAN:    In order of problems listed above:  #Aflutter with RVR: CHADs-vasc 2. Not on AC due to concern for instability on his feet.. Multiple ER  visits with Alutter with 2:1 conduction. Remains in [place] currently with HR 120-140s. Very symptomatic with worsening SOB with minimal exertion and appears to be grossly overloaded with concern for underlying HF. -Patient grossly overloaded, severely short of breath, and HR uncontrolled in 130-140s. Given symptoms and concern for worsening HF, will admit to in-patient for IV diuresis, rate control, and TEE/DCCV -Start anticoagulation with apixaban 74m BID -Continue metoprolol 224mXL -No CCB given concern for HF -Check TTE -Check TSH  #Heart failure with unknown EF #LE Edema #Orthopnea, SOB Patient grossly overloaded on exam with 2+ pitting edema and elevated JVP. BNP 909. Concern for tachy-induced cardiomyopathy in the setting of Aflutter with HR 120-140s. No TTE in our system. Currently with NYHA Class III symptoms. -Given symptoms, significant volume overload, and poorly rate controlled Aflutter, will admit to in-patient for IV diuresis, rate control, and TEE/DCCV -Check TTE -Continue home lisinopril -Continue metoprolol for rate control; avoid CCB given concern for HF -Monitor I/Os and daily weights -Low Na diet; fluid restrict to 150017m#Hypertension: -Continue lisinopril and metoprolol as above  Medication Adjustments/Labs and Tests Ordered: Current medicines are reviewed at length with the patient today.  Concerns regarding medicines are outlined above.  Orders Placed This Encounter  Procedures  . EKG 12-Lead   No orders of the defined types were placed in this encounter.   Patient Instructions  We are going to admit your to the hospital. They are getting a bed ready for you and will call you with a bed assignment.    Signed, HeaFreada BergeronD  02/02/2020 11:58 AM    [place]

## 2020-02-02 ENCOUNTER — Telehealth: Payer: Self-pay | Admitting: Podiatry

## 2020-02-02 ENCOUNTER — Other Ambulatory Visit (HOSPITAL_COMMUNITY): Payer: Medicare Other

## 2020-02-02 ENCOUNTER — Inpatient Hospital Stay (HOSPITAL_COMMUNITY)
Admission: AD | Admit: 2020-02-02 | Discharge: 2020-02-12 | DRG: 286 | Disposition: A | Discharge status: Home / Self Care | Payer: Medicare Other | Source: Ambulatory Visit | Attending: Cardiology | Admitting: Cardiology

## 2020-02-02 ENCOUNTER — Inpatient Hospital Stay (HOSPITAL_COMMUNITY): Payer: Medicare Other

## 2020-02-02 ENCOUNTER — Encounter: Payer: Self-pay | Admitting: Cardiology

## 2020-02-02 ENCOUNTER — Encounter (HOSPITAL_COMMUNITY): Payer: Self-pay | Admitting: Cardiology

## 2020-02-02 ENCOUNTER — Telehealth: Payer: Self-pay

## 2020-02-02 ENCOUNTER — Other Ambulatory Visit: Payer: Self-pay

## 2020-02-02 ENCOUNTER — Ambulatory Visit: Payer: Medicare Other | Admitting: Cardiology

## 2020-02-02 VITALS — BP 100/80 | HR 125 | Ht 76.0 in | Wt 220.0 lb

## 2020-02-02 DIAGNOSIS — M1712 Unilateral primary osteoarthritis, left knee: Secondary | ICD-10-CM | POA: Diagnosis not present

## 2020-02-02 DIAGNOSIS — R0602 Shortness of breath: Secondary | ICD-10-CM

## 2020-02-02 DIAGNOSIS — Q211 Atrial septal defect: Secondary | ICD-10-CM

## 2020-02-02 DIAGNOSIS — Z89411 Acquired absence of right great toe: Secondary | ICD-10-CM | POA: Diagnosis not present

## 2020-02-02 DIAGNOSIS — I5023 Acute on chronic systolic (congestive) heart failure: Secondary | ICD-10-CM | POA: Diagnosis present

## 2020-02-02 DIAGNOSIS — I361 Nonrheumatic tricuspid (valve) insufficiency: Secondary | ICD-10-CM

## 2020-02-02 DIAGNOSIS — F1190 Opioid use, unspecified, uncomplicated: Secondary | ICD-10-CM | POA: Diagnosis not present

## 2020-02-02 DIAGNOSIS — G8929 Other chronic pain: Secondary | ICD-10-CM | POA: Diagnosis present

## 2020-02-02 DIAGNOSIS — Z79899 Other long term (current) drug therapy: Secondary | ICD-10-CM

## 2020-02-02 DIAGNOSIS — I4892 Unspecified atrial flutter: Secondary | ICD-10-CM

## 2020-02-02 DIAGNOSIS — I428 Other cardiomyopathies: Secondary | ICD-10-CM

## 2020-02-02 DIAGNOSIS — I5021 Acute systolic (congestive) heart failure: Secondary | ICD-10-CM | POA: Diagnosis present

## 2020-02-02 DIAGNOSIS — I340 Nonrheumatic mitral (valve) insufficiency: Secondary | ICD-10-CM

## 2020-02-02 DIAGNOSIS — I483 Typical atrial flutter: Secondary | ICD-10-CM | POA: Diagnosis not present

## 2020-02-02 DIAGNOSIS — T40605A Adverse effect of unspecified narcotics, initial encounter: Secondary | ICD-10-CM | POA: Diagnosis present

## 2020-02-02 DIAGNOSIS — I5020 Unspecified systolic (congestive) heart failure: Secondary | ICD-10-CM | POA: Diagnosis not present

## 2020-02-02 DIAGNOSIS — Y929 Unspecified place or not applicable: Secondary | ICD-10-CM

## 2020-02-02 DIAGNOSIS — R600 Localized edema: Secondary | ICD-10-CM

## 2020-02-02 DIAGNOSIS — R400 Somnolence: Secondary | ICD-10-CM | POA: Diagnosis not present

## 2020-02-02 DIAGNOSIS — M79671 Pain in right foot: Secondary | ICD-10-CM | POA: Diagnosis not present

## 2020-02-02 DIAGNOSIS — M069 Rheumatoid arthritis, unspecified: Secondary | ICD-10-CM | POA: Diagnosis not present

## 2020-02-02 DIAGNOSIS — F40240 Claustrophobia: Secondary | ICD-10-CM | POA: Diagnosis not present

## 2020-02-02 DIAGNOSIS — M7989 Other specified soft tissue disorders: Secondary | ICD-10-CM | POA: Diagnosis not present

## 2020-02-02 DIAGNOSIS — I959 Hypotension, unspecified: Secondary | ICD-10-CM | POA: Diagnosis not present

## 2020-02-02 DIAGNOSIS — K5903 Drug induced constipation: Secondary | ICD-10-CM | POA: Diagnosis present

## 2020-02-02 DIAGNOSIS — I509 Heart failure, unspecified: Secondary | ICD-10-CM | POA: Diagnosis not present

## 2020-02-02 DIAGNOSIS — E876 Hypokalemia: Secondary | ICD-10-CM | POA: Diagnosis not present

## 2020-02-02 DIAGNOSIS — I4891 Unspecified atrial fibrillation: Secondary | ICD-10-CM | POA: Diagnosis not present

## 2020-02-02 DIAGNOSIS — G4700 Insomnia, unspecified: Secondary | ICD-10-CM | POA: Diagnosis present

## 2020-02-02 DIAGNOSIS — I5043 Acute on chronic combined systolic (congestive) and diastolic (congestive) heart failure: Secondary | ICD-10-CM | POA: Diagnosis not present

## 2020-02-02 DIAGNOSIS — I110 Hypertensive heart disease with heart failure: Secondary | ICD-10-CM | POA: Diagnosis present

## 2020-02-02 DIAGNOSIS — I9581 Postprocedural hypotension: Secondary | ICD-10-CM | POA: Diagnosis not present

## 2020-02-02 DIAGNOSIS — Z888 Allergy status to other drugs, medicaments and biological substances status: Secondary | ICD-10-CM

## 2020-02-02 DIAGNOSIS — M79674 Pain in right toe(s): Secondary | ICD-10-CM | POA: Diagnosis not present

## 2020-02-02 DIAGNOSIS — Z20822 Contact with and (suspected) exposure to covid-19: Secondary | ICD-10-CM | POA: Diagnosis not present

## 2020-02-02 DIAGNOSIS — Z8546 Personal history of malignant neoplasm of prostate: Secondary | ICD-10-CM | POA: Diagnosis not present

## 2020-02-02 DIAGNOSIS — Z96653 Presence of artificial knee joint, bilateral: Secondary | ICD-10-CM | POA: Diagnosis present

## 2020-02-02 DIAGNOSIS — Z8042 Family history of malignant neoplasm of prostate: Secondary | ICD-10-CM

## 2020-02-02 DIAGNOSIS — J9811 Atelectasis: Secondary | ICD-10-CM | POA: Diagnosis not present

## 2020-02-02 HISTORY — DX: Unspecified systolic (congestive) heart failure: I50.20

## 2020-02-02 LAB — BASIC METABOLIC PANEL
Anion gap: 11 (ref 5–15)
BUN: 30 mg/dL — ABNORMAL HIGH (ref 8–23)
CO2: 21 mmol/L — ABNORMAL LOW (ref 22–32)
Calcium: 8.3 mg/dL — ABNORMAL LOW (ref 8.9–10.3)
Chloride: 105 mmol/L (ref 98–111)
Creatinine, Ser: 0.93 mg/dL (ref 0.61–1.24)
GFR, Estimated: >60 mL/min (ref >60)
Glucose, Bld: 153 mg/dL — ABNORMAL HIGH (ref 70–99)
Potassium: 3.9 mmol/L (ref 3.5–5.1)
Sodium: 137 mmol/L (ref 135–145)

## 2020-02-02 LAB — ECHOCARDIOGRAM COMPLETE
Height: 76 in
S' Lateral: 5.3 cm
Weight: 3520 oz

## 2020-02-02 LAB — RESPIRATORY PANEL BY RT PCR (FLU A&B, COVID)
Influenza A by PCR: NEGATIVE
Influenza B by PCR: NEGATIVE
SARS Coronavirus 2 by RT PCR: NEGATIVE

## 2020-02-02 LAB — MAGNESIUM: Magnesium: 2 mg/dL (ref 1.7–2.4)

## 2020-02-02 MED ORDER — FUROSEMIDE 10 MG/ML IJ SOLN
60.0000 mg | Freq: Two times a day (BID) | INTRAMUSCULAR | Status: DC
  Filled 2020-02-02: qty 6

## 2020-02-02 MED ORDER — SODIUM CHLORIDE 0.9 % IV SOLN
4.0000 mg | Freq: Four times a day (QID) | INTRAVENOUS | Status: DC | PRN
  Filled 2020-02-02: qty 2

## 2020-02-02 MED ORDER — SODIUM CHLORIDE 0.9% FLUSH
3.0000 mL | Freq: Two times a day (BID) | INTRAVENOUS | Status: DC
  Administered 2020-02-02 – 2020-02-12 (×9): 3 mL via INTRAVENOUS

## 2020-02-02 MED ORDER — OXYCODONE HCL 5 MG PO TABS: 5.0000 mg | ORAL_TABLET | Freq: Four times a day (QID) | ORAL | Status: DC | PRN

## 2020-02-02 MED ORDER — ACETAMINOPHEN 325 MG PO TABS: 650.0000 mg | ORAL_TABLET | ORAL | Status: DC | PRN

## 2020-02-02 MED ORDER — APIXABAN 2.5 MG PO TABS
5.0000 mg | ORAL_TABLET | Freq: Two times a day (BID) | ORAL | Status: DC
  Administered 2020-02-02 – 2020-02-07 (×12): 5 mg via ORAL
  Filled 2020-02-02 (×12): qty 2

## 2020-02-02 MED ORDER — OXYCODONE-ACETAMINOPHEN 5-325 MG PO TABS
1.0000 | ORAL_TABLET | ORAL | Status: DC | PRN
  Administered 2020-02-02 – 2020-02-04 (×10): 1 via ORAL
  Filled 2020-02-02 (×11): qty 1

## 2020-02-02 MED ORDER — OXYCODONE-ACETAMINOPHEN 5-325 MG PO TABS: 1.0000 | ORAL_TABLET | Freq: Four times a day (QID) | ORAL | Status: DC | PRN

## 2020-02-02 MED ORDER — SODIUM CHLORIDE 0.9% FLUSH: 3.0000 mL | INTRAVENOUS | Status: DC | PRN

## 2020-02-02 MED ORDER — OXYCODONE HCL 5 MG PO TABS
5.0000 mg | ORAL_TABLET | ORAL | Status: DC | PRN
  Administered 2020-02-02 – 2020-02-04 (×10): 5 mg via ORAL
  Filled 2020-02-02 (×10): qty 1

## 2020-02-02 MED ORDER — FUROSEMIDE 10 MG/ML IJ SOLN
15.0000 mg/h | INTRAVENOUS | Status: DC
  Administered 2020-02-02 – 2020-02-03 (×2): 10 mg/h via INTRAVENOUS
  Administered 2020-02-05: 15 mg/h via INTRAVENOUS
  Administered 2020-02-05: 10 mg/h via INTRAVENOUS
  Administered 2020-02-07 – 2020-02-09 (×4): 15 mg/h via INTRAVENOUS
  Filled 2020-02-02 (×14): qty 25

## 2020-02-02 MED ORDER — OXYCODONE-ACETAMINOPHEN 10-325 MG PO TABS: 1.0000 | ORAL_TABLET | Freq: Four times a day (QID) | ORAL | Status: DC | PRN

## 2020-02-02 MED ORDER — METOPROLOL TARTRATE 12.5 MG HALF TABLET
25.0000 mg | ORAL_TABLET | Freq: Three times a day (TID) | ORAL | Status: DC
  Administered 2020-02-02 – 2020-02-11 (×23): 25 mg via ORAL
  Filled 2020-02-02 (×26): qty 2

## 2020-02-02 MED ORDER — SODIUM CHLORIDE 0.9 % IV SOLN: 250.0000 mL | INTRAVENOUS | Status: DC | PRN

## 2020-02-02 NOTE — Progress Notes (Signed)
  Echocardiogram 2D Echocardiogram has been performed.  Thomas [name] 02/02/2020, 5:08 PM

## 2020-02-02 NOTE — Progress Notes (Signed)
   02/02/20 1430  Assess: MEWS Score  Temp 97.8 F (36.6 C)  BP 108/87  Pulse Rate (!) 122  ECG Heart Rate (!) 123  Resp (!) 21  SpO2 99 %  Assess: MEWS Score  MEWS Temp 0  MEWS Systolic 0  MEWS Pulse 2  MEWS RR 1  MEWS LOC 0  MEWS Score 3  MEWS Score Color Yellow  Assess: if the MEWS score is Yellow or Red  Were vital signs taken at a resting state? Yes  Focused Assessment Change from prior assessment (see assessment flowsheet) (Pt is new admission)  Early Detection of Sepsis Score *See Row Information* Medium  MEWS guidelines implemented *See Row Information* Yes  Treat  MEWS Interventions Administered scheduled meds/treatments  Pain Scale 0-10  Pain Score 0  Take Vital Signs  Increase Vital Sign Frequency  Yellow: Q 2hr X 2 then Q 4hr X 2, if remains yellow, continue Q 4hrs  Escalate  MEWS: Escalate Yellow: discuss with charge nurse/RN and consider discussing with provider and RRT  Notify: Charge Nurse/RN  Name of Charge Nurse/RN Notified Christy RN  Date Charge Nurse/RN Notified 02/02/20  Time Charge Nurse/RN Notified 1445  Document  Patient Outcome Other (Comment) (Pt stable)  Progress note created (see row info) Yes

## 2020-02-02 NOTE — H&P (Signed)
Cardiology Admission History and Physical:   Patient ID: Thomas [name] MRN: [id number]; DOB: [birth year]   Admission date: 02/02/2020  Primary Care Provider: [name], MD [facility] Cardiologist: Gwyndolyn Kaufman, MD [place] Electrophysiologist:  None   Chief Complaint:  Aflutter, heart failure  Patient Profile:   Thomas [name] is a 69 y.o. male with history of HTN, prostate cancer and atrial flutter not on [facility] who presented to clinic today with worsening SOB and LE edema found to be in aflutter with RVR HR 120-40s and grossly volume overloaded concerning for acute HF exacerbation.  History of Present Illness:   Thomas [name] is a 69 y.o. male with a hx of HTN, prostate cancer, and atrial flutter who presented to clinic today with worsening SOB and LE edema found to be in aflutter with RVR HR 120-40s and grossly volume overloaded concerning for acute HF exacerbation now admitted to [facility] hospital for further management.  Patient was recently diagnosed with atrial flutter when he was preparing to go to OR for foot surgery. At that time, he was rate controlled with dilt but left the ED prior to further work-up. He followed with his primary care provider, but had stopped taking the diltiazem as he was concerned that this was making his LE worse. He had a subsequent ED visit for Aflutter with RVR, which he again was rate controlled with dilt but he left AMA prior to further work-up. He has not been on [facility] given concern for falls, although the patient states he has not fallen at home and feels steady on his feet.  Today, during his clinic visit patient stated the he has been feeling more SOB. Has been ongoing for 3-4weeks and is getting progressively worse. Has been taking lasix 59m daily which has helped a little bit, but he continues to have significant SOB with minimal exertion. He also notes orthopnea requiring to sleep on several pillow propped up and significant  bendopnea and fatigue. He does not have palpitations when he is in aflutter and did not realize he was out of home.   Blood pressures at home 120s/80s and HR 120s for the past several weeks   Labs: BNP 909, Cr 0.77, K 4.0, AST 23, ALT 20, Mg 1.9  Give the patient was symptomatic with significant SOB with minimal exertion, worsening LE edema and current Aflutter with RVR, [facility].   Past Medical History:  Diagnosis Date  . Cancer ([facility]    prostate  . Claustrophobia    quite severe  . Hypertension   . Hypoglycemia    occ  . Left knee DJD    Xray 12/23/08  . Prostate cancer ([facility]     Past Surgical History:  Procedure Laterality Date  . CYSTOSCOPY  04/11/2018   Procedure: CYSTOSCOPY FLEXIBLE;  Surgeon: HArdis Hughs MD;  [facility]  Service: Urology;;  NO SEEDS FOUND IN BLADDER  . HERNIA REPAIR  2009   inguinal  . JOINT REPLACEMENT Bilateral 2017   knees  . RADIOACTIVE SEED IMPLANT N/A 04/11/2018   Procedure: RADIOACTIVE SEED IMPLANT/BRACHYTHERAPY IMPLANT;  Surgeon: HArdis Hughs MD;  [facility]  Service: Urology;  Laterality: N/A;   69     SEEDS IMPLANTED  . SPACE OAR INSTILLATION N/A 04/11/2018   Procedure: SPACE OAR INSTILLATION;  Surgeon: HArdis Hughs MD;  [facility]  Service: Urology;  Laterality:  N/A;  . TOTAL KNEE ARTHROPLASTY Bilateral 09/13/2015   Procedure: BILATERAL KNEE ARTHROPLASTY ;  Surgeon: Paralee Cancel, MD;  Location: [facility];  Service: Orthopedics;  Laterality: Bilateral;     Medications Prior to Admission: Prior to Admission medications   Medication Sig Start Date End Date Taking? Authorizing Provider  ENBREL SURECLICK 50 MG/ML injection Inject into the skin once a week.  07/07/19   [provider]  furosemide (LASIX) 20 MG tablet Take 2 tablets (40 mg total) by mouth daily. 01/21/20   [name], MD  ibuprofen (ADVIL) 800 MG tablet Take 1 tablet (800 mg total) by mouth every 6 (six) hours as needed. 11/26/19   Felipa Furnace, DPM  methotrexate (RHEUMATREX) 2.5 MG tablet Take 20 mg by mouth once a week.  08/29/18   [provider]  metoprolol succinate (TOPROL-XL) 25 MG 24 hr tablet Take 1 tablet (25 mg total) by mouth daily. 01/21/20   [name], MD  NARCAN 4 MG/0.1ML LIQD nasal spray kit Place into the nose as needed.  11/01/18   [provider]  oxyCODONE-acetaminophen (PERCOCET) 10-325 MG tablet Take 1 tablet by mouth every 4 (four) hours as needed for pain. 01/05/20   Felipa Furnace, DPM  tamsulosin (FLOMAX) 0.4 MG CAPS capsule Take 1 capsule (0.4 mg total) by mouth daily. 04/11/18   [name], MD  testosterone cypionate (DEPOTESTOSTERONE CYPIONATE) 200 MG/ML injection Inject 100 mg into the muscle every 14 (fourteen) days.    [provider]     Allergies:    Allergies  Allergen Reactions  . Chlorthalidone Other (See Comments)    "Makes me light headed and I don't like the way it makes me feel"    Social History:   Social History   Socioeconomic History  . Marital status: Married    Spouse name: Not on file  . Number of children: Not on file  . Years of education: Not on file  . Highest education level: Not on file  Occupational History  . Not on file  Tobacco Use  . Smoking status: Never Smoker  . Smokeless tobacco: Never Used  Vaping Use  . Vaping Use: Never used  Substance and Sexual Activity  . Alcohol use: No    Alcohol/week: 0.0 standard drinks  . Drug use: Not Currently  . Sexual activity: Yes  Other Topics Concern  . Not on file  Social History Narrative  . Not on file   Social Determinants of Health   Financial Resource Strain:   . Difficulty of Paying Living Expenses: Not on file  Food Insecurity:   . Worried About Charity fundraiser in the Last Year: Not on file  . Ran Out of Food in the Last Year:  Not on file  Transportation Needs:   . Lack of Transportation (Medical): Not on file  . Lack of Transportation (Non-Medical): Not on file  Physical Activity:   . Days of Exercise per Week: Not on file  . Minutes of Exercise per Session: Not on file  Stress:   . Feeling of Stress : Not on file  Social Connections:   . Frequency of Communication with Friends and Family: Not on file  . Frequency of Social Gatherings with Friends and Family: Not on file  . Attends Religious Services: Not on file  . Active Member of Clubs or Organizations: Not on file  . Attends Archivist Meetings: Not on file  . Marital  Status: Not on file  Intimate Partner Violence:   . Fear of Current or Ex-Partner: Not on file  . Emotionally Abused: Not on file  . Physically Abused: Not on file  . Sexually Abused: Not on file    Family History:   The patient's family history includes Prostate cancer in his brother and father. There is no history of Colon cancer, Rectal cancer, or Stomach cancer.    ROS:  Please see the history of present illness.    Notable for SOB with minimal exertion, orthopnea, bendopnea, PND, LE edema.  No chest pressure, palpitations, fevers, chills, GI bleeding, hematuria, abdominal pain  Physical Exam/Data:   Vitals:   02/02/20 1430  BP: 108/87  Pulse: (!) 122  Resp: (!) 21  Temp: 97.8 F (36.6 C)  TempSrc: Oral  SpO2: 99%   No intake or output data in the 24 hours ending 02/02/20 1448 Last 3 Weights 02/02/2020 01/21/2020 12/25/2019  Weight (lbs) 220 lb 217 lb 9.6 oz 218 lb 3.2 oz  Weight (kg) 99.791 kg 98.703 kg 98.975 kg     There is no height or weight on file to calculate BMI.  GEN: Comfortable but SOB with speaking HEENT: Normal NECK: +JVD to mid-neck when sitting at 90 degrees, No carotid bruits LYMPHATICS: No lymphadenopathy CARDIAC: Tachycardic, regular, no murmurs RESPIRATORY:  Clear to auscultation without rales, wheezing or rhonchi  ABDOMEN: Soft,  non-tender, non-distended MUSCULOSKELETAL:  2+ tense pitting edema to the thighs. Warm. SKIN: Warm  NEUROLOGIC:  Alert and oriented x 3 PSYCHIATRIC:  Normal affect    Relevant CV Studies: The following studies were reviewed today: 07/2019 ABIs: Summary:  Right: Resting right ankle-brachial index is within normal range.  No evidence of significant right lower extremity arterial disease. The  right toe-brachial index is normal.   Left: Resting left ankle-brachial index is within normal range.  No evidence of significant left lower extremity arterial disease. The left  toe-brachial index is normal.   EKG:  EKG is  ordered today.  The ekg ordered today demonstrates Aflutter with HR 125  Laboratory Data:  High Sensitivity Troponin:  No results for input(s): TROPONINIHS in the last 720 hours.    ChemistryNo results for input(s): NA, K, CL, CO2, GLUCOSE, BUN, CREATININE, CALCIUM, GFRNONAA, GFRAA, ANIONGAP in the last 168 hours.  No results for input(s): PROT, ALBUMIN, AST, ALT, ALKPHOS, BILITOT in the last 168 hours. HematologyNo results for input(s): WBC, RBC, HGB, HCT, MCV, MCH, MCHC, RDW, PLT in the last 168 hours. BNPNo results for input(s): BNP, PROBNP in the last 168 hours.  DDimer No results for input(s): DDIMER in the last 168 hours.   Radiology/Studies:  No results found.   Assessment and Plan:    #Aflutter with RVR: CHADs-vasc 2. Not on [facility] previously due to concern for instability on his feet, however, patient denies recent falls and no contraindications to [facility]. Multiple ER visits with Alutter with 2:1 conduction. Remains in [place] currently with HR 120-140s. Very symptomatic with worsening SOB, LE edema, and orthopnea. Given symptoms and concern for worsening HF, decision was made to admit to in-patient for IV diuresis, rate control, and TEE/DCCV. -Diurese with lasix 21m IV BID; if inadequate UOP, will consider lasix gtt -Start anticoagulation with apixaban 557mBID;  risks and benefits discussed with the patient in clinic and he agrees with starting AC at this time -TTE -Metop 2552mID for rate control -No CCB given concern for HF -Check TSH -Once more euvolemic, will plan  for TEE/DCCV as patient cannot lay flat currently  #Heart failure with unknown EF #LE Edema #Orthopnea, SOB Patient grossly overloaded on exam with 2+ pitting edema and elevated JVP. BNP 909. Concern for tachy-induced cardiomyopathy in the setting of Aflutter with HR 120-140s. No TTE in our system. Currently with NYHA Class III symptoms. -Check TTE -Diurese with lasix 75m IV BID as above; adjust as needed -Continue home lisinopril -Continue metoprolol for rate control; avoid CCB given concern for HF -Check BNP and trend electrolytes closely with diuresis -Monitor I/Os and daily weights -Low Na diet; fluid restrict to 15062m #Hypertension: -Continue lisinopril and metoprolol as above  The appropriate patient status for this patient is INPATIENT. Inpatient status is judged to be reasonable and necessary in order to provide the required intensity of service to ensure the patient's safety. The patient's presenting symptoms, physical exam findings, and initial radiographic and laboratory data in the context of their chronic comorbidities is felt to place them at high risk for further clinical deterioration. Furthermore, it is not anticipated that the patient will be medically stable for discharge from the hospital within 2 midnights of admission. The following factors support the patient status of inpatient.   " The patient's presenting symptoms include shortness of breath, orthopnea, LED edema. " The worrisome physical exam findings include 2+ pitting edema to the thighs with HR 120-140s. " The chronic co-morbidities include Aflutter, HTN, HF.   * I certify that at the point of admission it is my clinical judgment that the patient will require inpatient hospital care spanning  beyond 2 midnights from the point of admission due to high intensity of service, high risk for further deterioration and high frequency of surveillance required.*    For questions or updates, please contact [facility] consult [email or web address] for contact info under     Signed, HeFreada BergeronMD  02/02/2020 2:48 PM

## 2020-02-02 NOTE — Discharge Instructions (Addendum)
Heart Failure Education: 1. Weigh yourself EVERY morning after you go to the bathroom but before you eat or drink anything. Write this number down in a weight log/diary. If you gain 3 pounds overnight or 5 pounds in a week, call the office. 2. Take your medicines as prescribed. If you have concerns about your medications, please call us before you stop taking them.  3. Eat low salt foods--Limit salt (sodium) to 2000 mg per day. This will help prevent your body from holding onto fluid. Read food labels as many processed foods have a lot of sodium, especially canned goods and prepackaged meats. If you would like some assistance choosing low sodium foods, we would be happy to set you up with a nutritionist. 4. Stay as active as you can everyday. Staying active will give you more energy and make your muscles stronger. Start with 5 minutes at a time and work your way up to 30 minutes a day. Break up your activities--do some in the morning and some in the afternoon. Start with 3 days per week and work your way up to 5 days as you can.  If you have chest pain, feel short of breath, dizzy, or lightheaded, STOP. If you don't feel better after a short rest, call 911. If you do feel better, call the office to let us know you have symptoms with exercise. 5. Limit all fluids for the day to less than 2 liters. Fluid includes all drinks, coffee, juice, ice chips, soup, jello, and all other liquids.  Information on my medicine - ELIQUIS (apixaban)  This medication education was reviewed with me or my healthcare representative as part of my discharge preparation.   Why was Eliquis prescribed for you? Eliquis was prescribed for you to reduce the risk of a blood clot forming that can cause a stroke if you have a medical condition called atrial fibrillation (a type of irregular heartbeat).  What do You need to know about Eliquis ? Take your Eliquis TWICE DAILY - one tablet in the morning and one tablet in the  evening with or without food. If you have difficulty swallowing the tablet whole please discuss with your pharmacist how to take the medication safely.  Take Eliquis exactly as prescribed by your doctor and DO NOT stop taking Eliquis without talking to the doctor who prescribed the medication.  Stopping may increase your risk of developing a stroke.  Refill your prescription before you run out.  After discharge, you should have regular check-up appointments with your healthcare provider that is prescribing your Eliquis.  In the future your dose may need to be changed if your kidney function or weight changes by a significant amount or as you get older.  What do you do if you miss a dose? If you miss a dose, take it as soon as you remember on the same day and resume taking twice daily.  Do not take more than one dose of ELIQUIS at the same time to make up a missed dose.  Important Safety Information A possible side effect of Eliquis is bleeding. You should call your healthcare provider right away if you experience any of the following: ? Bleeding from an injury or your nose that does not stop. ? Unusual colored urine (red or dark brown) or unusual colored stools (red or black). ? Unusual bruising for unknown reasons. ? A serious fall or if you hit your head (even if there is no bleeding).  Some medicines may interact with  Eliquis and might increase your risk of bleeding or clotting while on Eliquis. To help avoid this, consult your healthcare provider or pharmacist prior to using any new prescription or non-prescription medications, including herbals, vitamins, non-steroidal anti-inflammatory drugs (NSAIDs) and supplements.  This website has more information on Eliquis (apixaban): http://www.eliquis.com/eliquis/home  Foot care per podiatry   Call [facility] at [phone] if any bleeding, swelling or drainage at cath site.  May shower, no tub baths for 48 hours for  groin sticks. No lifting over 5 pounds for 3 days.  No Driving for 3 days

## 2020-02-02 NOTE — Telephone Encounter (Signed)
Patient called in regards to his pain medication. He has a follow up scheduled with his pain doctor but wanted enough pain medication to last until then. I tried to call the patient back but no answer. Left VM to call back and also message sent to Dr. Posey Pronto.

## 2020-02-02 NOTE — Telephone Encounter (Signed)
Patient calls nurse line regarding exertional SHOB. Patient reports onset approx one week ago. Denies cough, fever, congestion or any other sick symptoms. Patient is able to speak in complete sentences during conversation and does not appear to be in any distress. Strict ED precautions given. Scheduled patient to follow up with Dr. Erin Hearing tomorrow morning, 10/12.  To PCP  Talbot Grumbling, RN

## 2020-02-02 NOTE — Patient Instructions (Signed)
We are going to admit your to the hospital. They are getting a bed ready for you and will call you with a bed assignment.

## 2020-02-02 NOTE — Telephone Encounter (Signed)
Patient called back. He states that you were going to send pain medication for him until he sees his pain management doctor. He sees them on Oct 18th and was wanting to see if you could refill his medication until the 17th. Please call patient.

## 2020-02-03 ENCOUNTER — Inpatient Hospital Stay (HOSPITAL_COMMUNITY): Payer: Medicare Other

## 2020-02-03 ENCOUNTER — Ambulatory Visit: Payer: Medicare Other | Admitting: Family Medicine

## 2020-02-03 DIAGNOSIS — I483 Typical atrial flutter: Secondary | ICD-10-CM

## 2020-02-03 DIAGNOSIS — I5020 Unspecified systolic (congestive) heart failure: Secondary | ICD-10-CM | POA: Diagnosis not present

## 2020-02-03 LAB — CBC
HCT: 38.6 % — ABNORMAL LOW (ref 39.0–52.0)
Hemoglobin: 11.9 g/dL — ABNORMAL LOW (ref 13.0–17.0)
MCH: 25.3 pg — ABNORMAL LOW (ref 26.0–34.0)
MCHC: 30.8 g/dL (ref 30.0–36.0)
MCV: 82 fL (ref 80.0–100.0)
Platelets: 361 10*3/uL (ref 150–400)
RBC: 4.71 MIL/uL (ref 4.22–5.81)
RDW: 16.1 % — ABNORMAL HIGH (ref 11.5–15.5)
WBC: 7.7 10*3/uL (ref 4.0–10.5)
nRBC: 0 % (ref 0.0–0.2)

## 2020-02-03 LAB — BASIC METABOLIC PANEL
Anion gap: 11 (ref 5–15)
BUN: 26 mg/dL — ABNORMAL HIGH (ref 8–23)
CO2: 23 mmol/L (ref 22–32)
Calcium: 8.5 mg/dL — ABNORMAL LOW (ref 8.9–10.3)
Chloride: 104 mmol/L (ref 98–111)
Creatinine, Ser: 0.92 mg/dL (ref 0.61–1.24)
GFR, Estimated: >60 mL/min (ref >60)
Glucose, Bld: 107 mg/dL — ABNORMAL HIGH (ref 70–99)
Potassium: 3.9 mmol/L (ref 3.5–5.1)
Sodium: 138 mmol/L (ref 135–145)

## 2020-02-03 LAB — HEMOGLOBIN A1C
Hgb A1c MFr Bld: 6.2 % — ABNORMAL HIGH (ref 4.8–5.6)
Mean Plasma Glucose: 131.24 mg/dL

## 2020-02-03 LAB — TSH: TSH: 5.337 u[IU]/mL — ABNORMAL HIGH (ref 0.350–4.500)

## 2020-02-03 LAB — MAGNESIUM: Magnesium: 1.9 mg/dL (ref 1.7–2.4)

## 2020-02-03 IMAGING — DX DG CHEST 1V PORT
1 series · 1 of 1 positions shown · non-contrast
Comparison: [DATE]

CLINICAL DATA: Shortness of breath

EXAM:
PORTABLE CHEST 1 VIEW

[chest ap]
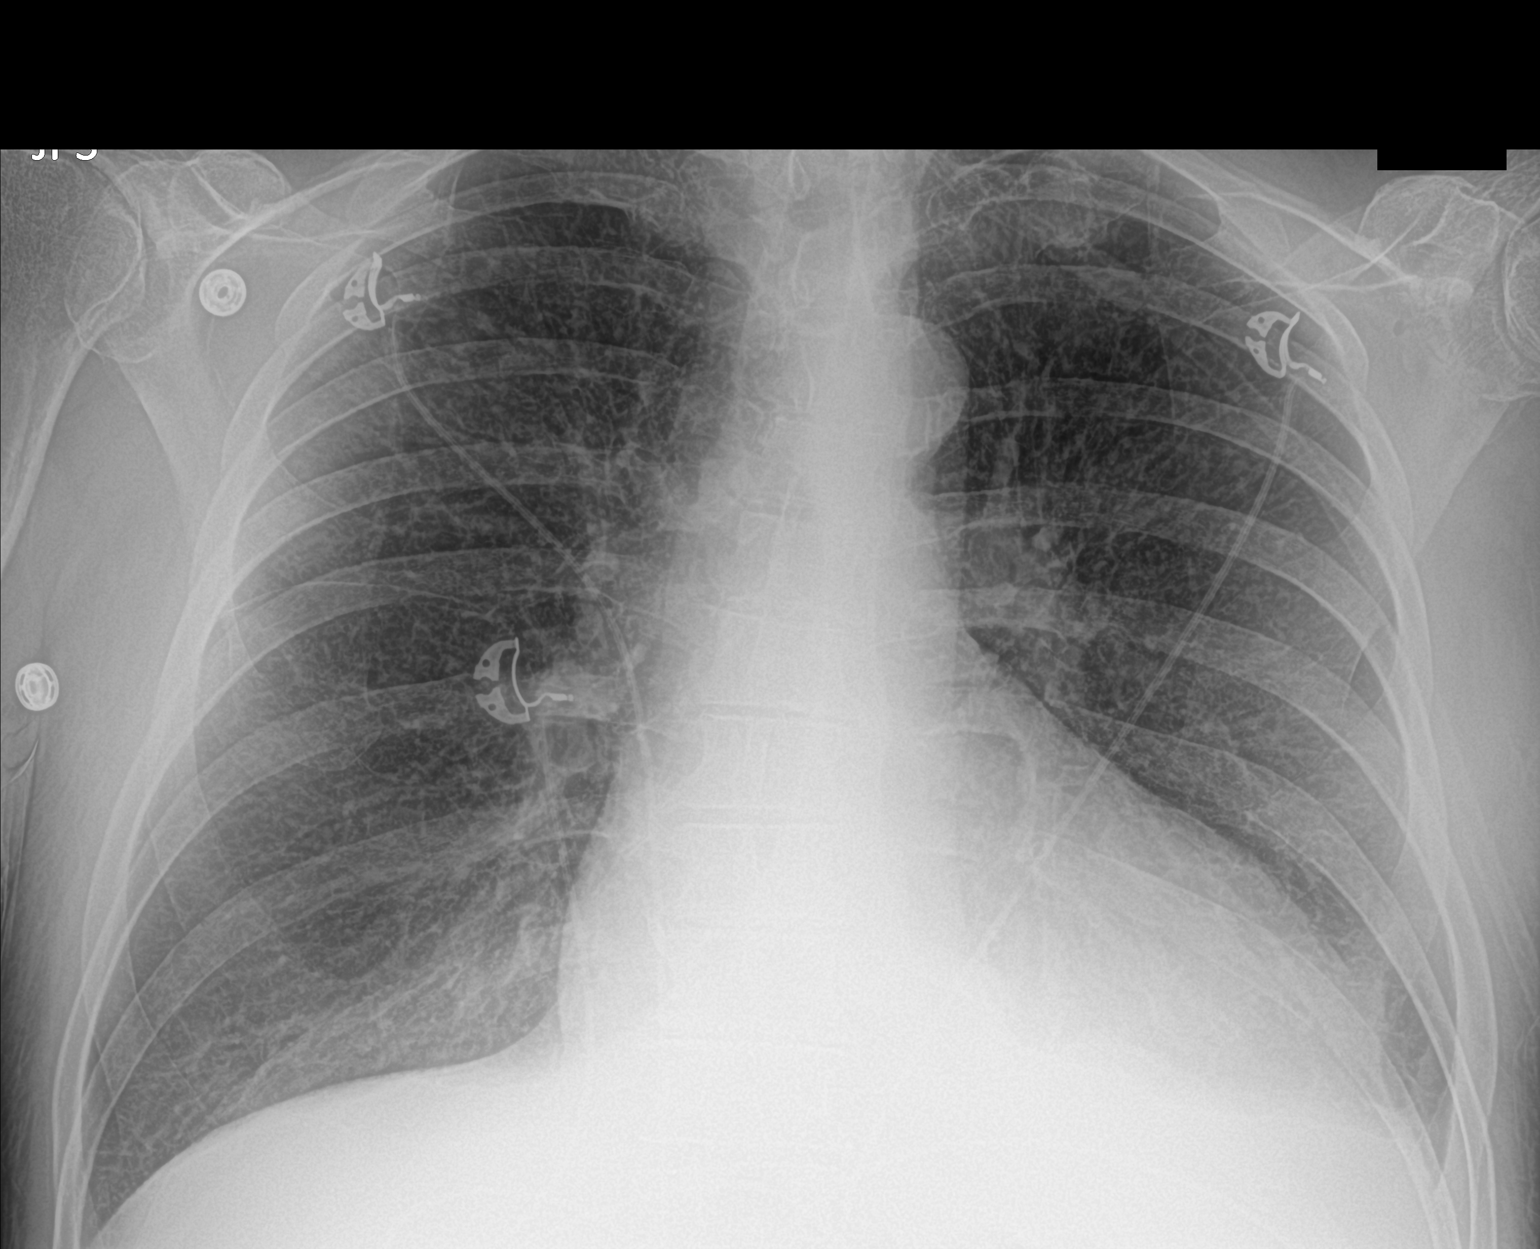

[1 of 1 positions shown; findings below may reference images not displayed]

FINDINGS: Minimal atelectasis at the left lung base. Chronic mild interstitial
prominence. No pleural effusion pneumothorax. Similar
cardiomediastinal contours. Heart size is likely normal portable
technique.
IMPRESSION: Minimal atelectasis at the left lung base.

## 2020-02-03 MED ORDER — OXYCODONE HCL 10 MG PO TABS
10.0000 mg | ORAL_TABLET | ORAL | 0 refills | Status: AC
Start: 2020-02-03 — End: 2020-02-08

## 2020-02-03 MED ORDER — OXYCODONE-ACETAMINOPHEN 10-325 MG PO TABS: 1.0000 | ORAL_TABLET | ORAL | 0 refills | Status: DC | PRN

## 2020-02-03 NOTE — Addendum Note (Signed)
Addended by: Boneta Lucks on: 02/03/2020 10:35 AM   Modules accepted: Orders

## 2020-02-03 NOTE — Addendum Note (Signed)
Addended by: Boneta Lucks on: 02/03/2020 07:48 AM   Modules accepted: Orders

## 2020-02-03 NOTE — Progress Notes (Addendum)
Progress Note  Patient Name: Thomas [name] Date of Encounter: 02/03/2020  [facility] Cardiologist: Dr. Johney Frame  Subjective   Still in aflutter with rates in the 120s. Echo showed decreased EF <20%. Patient denies chest pain. Does have SOB and LLE. He put out -1.1L urine overnight.   Inpatient Medications    Scheduled Meds:  apixaban  5 mg Oral BID   metoprolol tartrate  25 mg Oral TID   sodium chloride flush  3 mL Intravenous Q12H   Continuous Infusions:  sodium chloride     furosemide (LASIX) infusion 10 mg/hr (02/02/20 2200)   ondansetron (ZOFRAN) IV     PRN Meds: sodium chloride, acetaminophen, ondansetron (ZOFRAN) IV, oxyCODONE-acetaminophen **AND** oxyCODONE, sodium chloride flush   Vital Signs    Vitals:   02/02/20 2130 02/03/20 0058 02/03/20 0541 02/03/20 0834  BP: 111/78 102/72 116/90 113/90  Pulse: (!) 121 (!) 121 (!) 120 (!) 122  Resp: 18  16 18   Temp: 97.9 F (36.6 C) 98.2 F (36.8 C) 98.2 F (36.8 C) (!) 97.3 F (36.3 C)  TempSrc:   Oral Oral  SpO2: 99% 99% 98% 98%  Weight:   100 kg   Height:        Intake/Output Summary (Last 24 hours) at 02/03/2020 1000 Last data filed at 02/03/2020 0700 Gross per 24 hour  Intake 85.7 ml  Output 2275 ml  Net -2189.3 ml   Last 3 Weights 02/03/2020 02/02/2020 02/02/2020  Weight (lbs) 220 lb 6.4 oz 223 lb 4.8 oz 223 lb 4.8 oz  Weight (kg) 99.973 kg 101.288 kg 101.288 kg      Telemetry    Aflutter, HR 120s - Personally Reviewed  ECG    Aflutter 2:1 AV conduction, HR 122 bpm - Personally Reviewed  Physical Exam   GEN: No acute distress.   Neck: mild JVD Cardiac: Reg IRreg, tachy, no murmurs, rubs, or gallops.  Respiratory: decreased at bases, R>L. GI: Soft, nontender, non-distended  MS: 2-3+ B/L lower leg edema; No deformity. Neuro:  Nonfocal  Psych: Normal affect   Labs    High Sensitivity Troponin:  No results for input(s): TROPONINIHS in the last 720 hours.    Chemistry Recent  Labs  Lab 02/02/20 2134 02/03/20 0421  NA 137 138  K 3.9 3.9  CL 105 104  CO2 21* 23  GLUCOSE 153* 107*  BUN 30* 26*  CREATININE 0.93 0.92  CALCIUM 8.3* 8.5*  GFRNONAA >60 >60  ANIONGAP 11 11     Hematology Recent Labs  Lab 02/03/20 0421  WBC 7.7  RBC 4.71  HGB 11.9*  HCT 38.6*  MCV 82.0  MCH 25.3*  MCHC 30.8  RDW 16.1*  PLT 361    Radiology    DG CHEST PORT 1 VIEW  Result Date: 02/03/2020 CLINICAL DATA:  Shortness of breath EXAM: PORTABLE CHEST 1 VIEW COMPARISON:  11/18/2019 FINDINGS: Minimal atelectasis at the left lung base. Chronic mild interstitial prominence. No pleural effusion pneumothorax. Similar cardiomediastinal contours. Heart size is likely normal portable technique. IMPRESSION: Minimal atelectasis at the left lung base. Electronically Signed   By: Macy Mis M.D.   On: 02/03/2020 08:07   ECHOCARDIOGRAM COMPLETE  Result Date: 02/02/2020    ECHOCARDIOGRAM REPORT   Patient Name:   Thomas [name] Date of Exam: 02/02/2020 Medical Rec #:  [id number]       Height:       76.0 in Accession #:    [id number]  Weight:       220.0 lb Date of Birth:  06-Feb-1951       BSA:          2.307 m Patient Age:    69 years        BP:           129/62 mmHg Patient Gender: M               HR:           124 bpm. Exam Location:  Inpatient Procedure: 2D Echo, Cardiac Doppler and Color Doppler Indications:    Cardiomyopathy-Unspecified  History:        Patient has no prior history of Echocardiogram examinations.                 Arrythmias:Atrial Flutter, Signs/Symptoms:Shortness of Breath;                 Risk Factors:Hypertension.  Sonographer:    Clayton Lefort RDCS (AE) Referring Phys: [id number] HEATHER E [place] IMPRESSIONS  1. While there is no visualized LV thrombus on sweeps of the apex, patient did not have IV access, unable to use echo contrast for confirmation. Left ventricular ejection fraction, by estimation, is <20%. The left ventricle has severely decreased function. The  left ventricle demonstrates global hypokinesis. The left ventricular internal cavity size was moderately dilated. Left ventricular diastolic function could not be evaluated.  2. Right ventricular systolic function is normal. The right ventricular size is mildly enlarged. There is mildly elevated pulmonary artery systolic pressure.  3. Left atrial size was severely dilated.  4. Right atrial size was severely dilated.  5. Moderate pleural effusion in both left and right lateral regions.  6. The mitral valve is normal in structure. Mild mitral valve regurgitation.  7. The aortic valve is tricuspid. There is mild calcification of the aortic valve. Aortic valve regurgitation is not visualized. Mild aortic valve sclerosis is present, with no evidence of aortic valve stenosis.  8. The inferior vena cava is dilated in size with <50% respiratory variability, suggesting right atrial pressure of 15 mmHg. Conclusion(s)/Recommendation(s): Patient in atrial flutter with RVR during study. Discussed severely reduced EF with Dr. Gasper Sells, cardiologist on call. FINDINGS  Left Ventricle: While there is no visualized LV thrombus on sweeps of the apex, patient did not have IV access, unable to use echo contrast for confirmation. Left ventricular ejection fraction, by estimation, is <20%. The left ventricle has severely decreased function. The left ventricle demonstrates global hypokinesis. The left ventricular internal cavity size was moderately dilated. There is borderline left ventricular hypertrophy. Left ventricular diastolic function could not be evaluated. Right Ventricle: The right ventricular size is mildly enlarged. Right vetricular wall thickness was not well visualized. Right ventricular systolic function is normal. There is mildly elevated pulmonary artery systolic pressure. The tricuspid regurgitant  velocity is 2.71 m/s, and with an assumed right atrial pressure of 15 mmHg, the estimated right ventricular systolic  pressure is 48.2 mmHg. Left Atrium: Left atrial size was severely dilated. Right Atrium: Right atrial size was severely dilated. Pericardium: Trivial pericardial effusion is present. Mitral Valve: The mitral valve is normal in structure. Mild mitral valve regurgitation. Tricuspid Valve: The tricuspid valve is normal in structure. Tricuspid valve regurgitation is mild. Aortic Valve: The aortic valve is tricuspid. There is mild calcification of the aortic valve. Aortic valve regurgitation is not visualized. Mild aortic valve sclerosis is present, with no evidence of aortic valve stenosis. Pulmonic Valve: The  pulmonic valve was not well visualized. Pulmonic valve regurgitation is not visualized. No evidence of pulmonic stenosis. Aorta: The aortic root, ascending aorta and aortic arch are all structurally normal, with no evidence of dilitation or obstruction. Venous: The inferior vena cava is dilated in size with less than 50% respiratory variability, suggesting right atrial pressure of 15 mmHg. IAS/Shunts: The atrial septum is grossly normal. Additional Comments: There is a moderate pleural effusion in both left and right lateral regions.  LEFT VENTRICLE PLAX 2D LVIDd:         5.60 cm LVIDs:         5.30 cm LV PW:         1.10 cm LV IVS:        1.20 cm LVOT diam:     2.20 cm LV SV:         21 LV SV Index:   9 LVOT Area:     3.80 cm  RIGHT VENTRICLE             IVC RV Basal diam:  4.50 cm     IVC diam: 3.40 cm RV Mid diam:    3.90 cm RV S prime:     10.80 cm/s TAPSE (M-mode): 1.6 cm LEFT ATRIUM              Index       RIGHT ATRIUM           Index LA diam:        4.80 cm  2.08 cm/m  RA Area:     34.10 cm LA Vol (A2C):   122.0 ml 52.89 ml/m RA Volume:   139.00 ml 60.26 ml/m LA Vol (A4C):   97.7 ml  42.35 ml/m LA Biplane Vol: 112.0 ml 48.55 ml/m  AORTIC VALVE LVOT Vmax:   47.20 cm/s LVOT Vmean:  30.567 cm/s LVOT VTI:    0.055 m  AORTA Ao Root diam: 4.20 cm Ao Asc diam:  3.60 cm TRICUSPID VALVE TR Peak grad:   29.4  mmHg TR Vmax:        271.00 cm/s  SHUNTS Systemic VTI:  0.06 m Systemic Diam: 2.20 cm Buford Dresser MD Electronically signed by Buford Dresser MD Signature Date/Time: 02/02/2020/7:41:05 PM    Final     Cardiac Studies   Echo 02/02/20   1. While there is no visualized LV thrombus on sweeps of the apex,  patient did not have IV access, unable to use echo contrast for  confirmation. Left ventricular ejection fraction, by estimation, is <20%.  The left ventricle has severely decreased  function. The left ventricle demonstrates global hypokinesis. The left  ventricular internal cavity size was moderately dilated. Left ventricular  diastolic function could not be evaluated.   2. Right ventricular systolic function is normal. The right ventricular  size is mildly enlarged. There is mildly elevated pulmonary artery  systolic pressure.   3. Left atrial size was severely dilated.   4. Right atrial size was severely dilated.   5. Moderate pleural effusion in both left and right lateral regions.   6. The mitral valve is normal in structure. Mild mitral valve  regurgitation.   7. The aortic valve is tricuspid. There is mild calcification of the  aortic valve. Aortic valve regurgitation is not visualized. Mild aortic  valve sclerosis is present, with no evidence of aortic valve stenosis.   8. The inferior vena cava is dilated in size with <50% respiratory  variability, suggesting right atrial  pressure of 15 mmHg.    Patient Profile     69 y.o. male with history of HTN, prostate cancer, and atrial flutter not on AC who was admitted from the office found to be in aflutter RVR with HR 120-140s with lower leg edema.   Assessment & Plan    Aflutter RVR - patient was recently diagnosed with aflutter when preparing for foot surgery. He was rate controlled with dilt but left the ED prior to further work-up. He stopped dilt bc he thought it was causing severe LLE. He had subsequent ED  visit for Aflutter RVR but left prior to being seen. Not on a/c previously due to concern for falls. CHADSVASC 2 - Seen in the cardiology clinic 10/11 and was sob with LLE, already on lasix 60 mg daily. Bps stable. EKG showed aflutter RVR with heart rates 120-140s and he was admitted for further management and diuresis - Started on Eliquis 5mg  BID - Started on rate control with Metop 25mg  TID - No CCB for acute CHF - check TSH . Keep K>4 and Mag >2 - Echo showed severely decreased EF, <20% - Patient still in aflutter with rates up to 120s.BP in the room with SBPs in the 90s, unsure patient can tolerate further titration of BB. Echo showed severely dilated left and right atrium, unsure if he underwent TEE/DCCV he would hold sinus. Might need AAA. Will discuss plan with MD.   Acute systolic CHF/ Cardiomyopathy with EF<20% - Echo showed LVEF <20%, global hypokinesis, normal RV function, severely dilated LA and RA, moderate pleural effusion, mild MR, mild AV sclerosis - BNP 900. CXR showed minimal atelectasis of left lung base - This is new and there is no prior echo. No known history of CAD. Could possibly be ?tachy-mediated - continue BB - IV lasix 60 mg BID - Patient put out 2.1L urine so far. Weight down 3 lbs.  - creatinine stable - If pressures stable can likely start ARB/ARNI - Patient will likely need cath to evaluate for ischemia although denies chest pain. Will RF with FLP and A1C.  - Still volume up, continue with diuresis    For questions or updates, please contact [facility] Please consult [email or web address] for contact info under        Signed, Cadence H Furth, PA-C  02/03/2020, 10:00 AM     Personally seen and examined. Agree with APP Provider above with the following comments:   69 yo M with AF and HFrEF; suspect tachymediated - profoundly volume overloaded; will continue lasix drip and if no improvement with this will discuss metolazone. - when euvolemic will need  TEE/DCCV - will need ischemic work up - soft BP; will prioritize euvolemic and then trial of low dose Entresto vs spironolactone  Werner Lean, MD

## 2020-02-03 NOTE — Telephone Encounter (Signed)
Done. Thank you.

## 2020-02-04 ENCOUNTER — Telehealth: Payer: Self-pay | Admitting: Cardiology

## 2020-02-04 ENCOUNTER — Encounter: Payer: Medicare Other | Admitting: Podiatry

## 2020-02-04 ENCOUNTER — Inpatient Hospital Stay (HOSPITAL_COMMUNITY): Payer: Medicare Other

## 2020-02-04 ENCOUNTER — Encounter (HOSPITAL_COMMUNITY): Payer: Self-pay | Admitting: Cardiology

## 2020-02-04 DIAGNOSIS — I483 Typical atrial flutter: Secondary | ICD-10-CM | POA: Diagnosis not present

## 2020-02-04 DIAGNOSIS — F1190 Opioid use, unspecified, uncomplicated: Secondary | ICD-10-CM

## 2020-02-04 DIAGNOSIS — I5020 Unspecified systolic (congestive) heart failure: Secondary | ICD-10-CM | POA: Diagnosis not present

## 2020-02-04 DIAGNOSIS — M069 Rheumatoid arthritis, unspecified: Secondary | ICD-10-CM

## 2020-02-04 DIAGNOSIS — Z89411 Acquired absence of right great toe: Secondary | ICD-10-CM

## 2020-02-04 LAB — BASIC METABOLIC PANEL
Anion gap: 10 (ref 5–15)
BUN: 24 mg/dL — ABNORMAL HIGH (ref 8–23)
CO2: 31 mmol/L (ref 22–32)
Calcium: 8.7 mg/dL — ABNORMAL LOW (ref 8.9–10.3)
Chloride: 98 mmol/L (ref 98–111)
Creatinine, Ser: 1.07 mg/dL (ref 0.61–1.24)
GFR, Estimated: >60 mL/min (ref >60)
Glucose, Bld: 105 mg/dL — ABNORMAL HIGH (ref 70–99)
Potassium: 4.1 mmol/L (ref 3.5–5.1)
Sodium: 139 mmol/L (ref 135–145)

## 2020-02-04 LAB — LIPID PANEL
Cholesterol: 111 mg/dL (ref 0–200)
HDL: 32 mg/dL — ABNORMAL LOW (ref >40)
LDL Cholesterol: 65 mg/dL (ref 0–99)
Total CHOL/HDL Ratio: 3.5 RATIO
Triglycerides: 72 mg/dL (ref <150)
VLDL: 14 mg/dL (ref 0–40)

## 2020-02-04 LAB — CBC
HCT: 40.7 % (ref 39.0–52.0)
Hemoglobin: 12.3 g/dL — ABNORMAL LOW (ref 13.0–17.0)
MCH: 24.8 pg — ABNORMAL LOW (ref 26.0–34.0)
MCHC: 30.2 g/dL (ref 30.0–36.0)
MCV: 82.2 fL (ref 80.0–100.0)
Platelets: 335 10*3/uL (ref 150–400)
RBC: 4.95 MIL/uL (ref 4.22–5.81)
RDW: 16.1 % — ABNORMAL HIGH (ref 11.5–15.5)
WBC: 6.7 10*3/uL (ref 4.0–10.5)
nRBC: 0 % (ref 0.0–0.2)

## 2020-02-04 LAB — TROPONIN I (HIGH SENSITIVITY)
Troponin I (High Sensitivity): 18 ng/L — ABNORMAL HIGH (ref <18)
Troponin I (High Sensitivity): 23 ng/L — ABNORMAL HIGH (ref <18)

## 2020-02-04 LAB — T4, FREE: Free T4: 0.86 ng/dL (ref 0.61–1.12)

## 2020-02-04 LAB — MAGNESIUM: Magnesium: 1.8 mg/dL (ref 1.7–2.4)

## 2020-02-04 IMAGING — CR DG TOE GREAT 2+V*R*
3 series · 3 of 3 positions shown · non-contrast
Comparison: Right foot series [DATE].

CLINICAL DATA: 69-year-old male with pain.

EXAM:
RIGHT GREAT TOE

[toe ap]
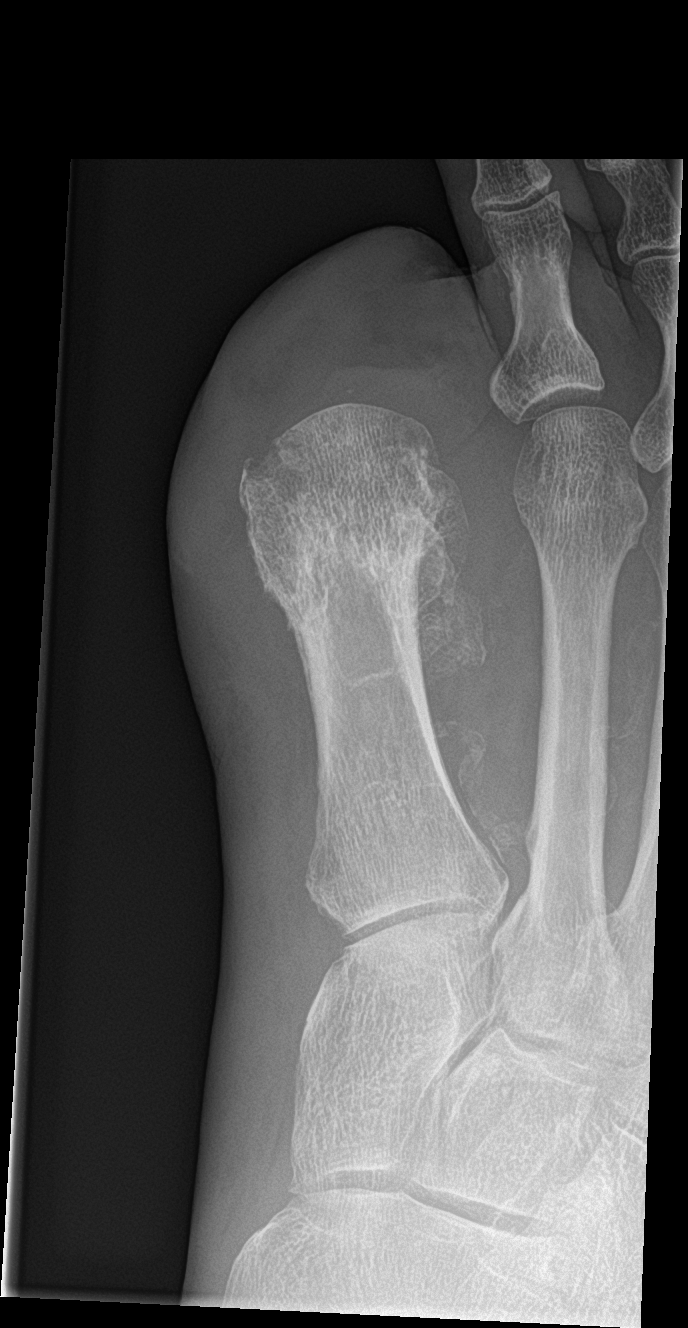

[toe obl]
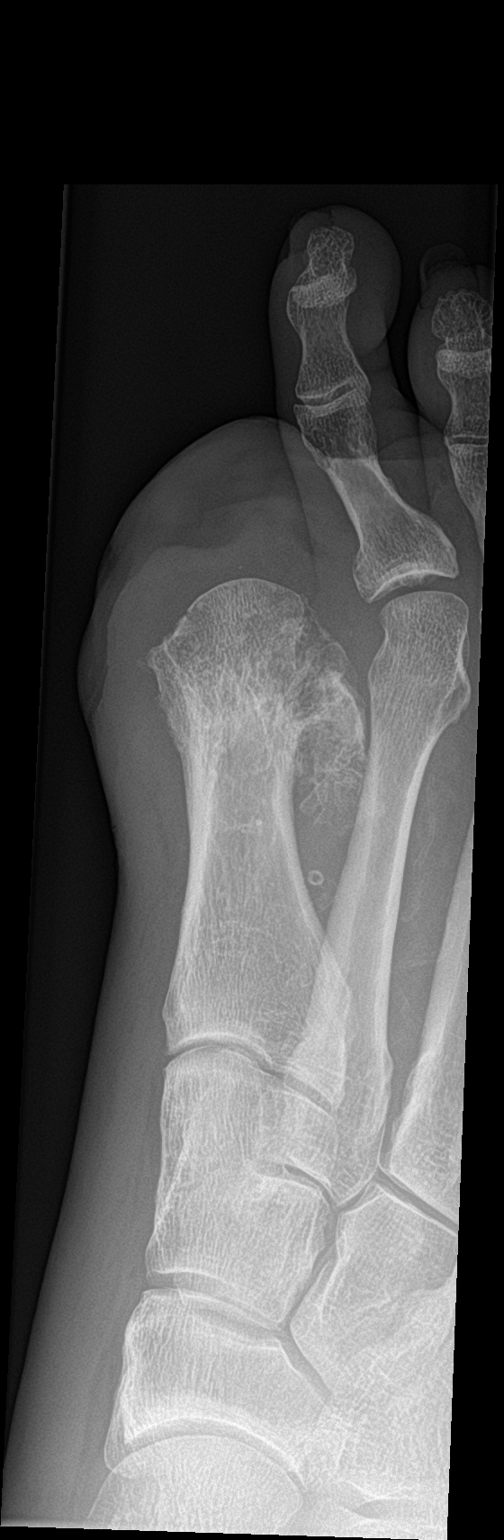

[toe lat]
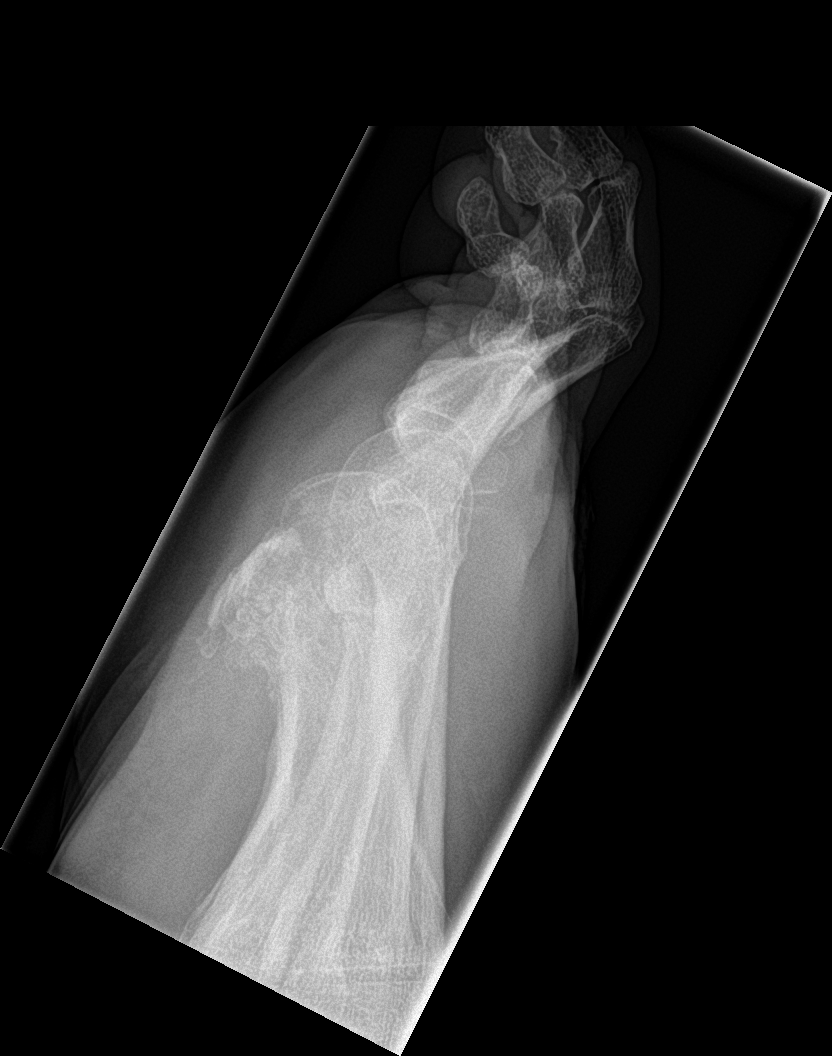

[3 of 3 positions shown; findings below may reference images not displayed]

FINDINGS: Soft tissue swelling at the prior 1st toe amputation site. The
underlying metatarsal head appears stable since [DATE], arguing
against active osteomyelitis despite a persistent area of mild
cortical irregularity and lucency. No new cortical irregularity.

Extensive calcified peripheral vascular disease. Other visible
osseous structures appear intact.
IMPRESSION: 1. Prior 1st toe amputation with soft tissue swelling at the site.
Underlying 1st metatarsal head appears stable since [REDACTED]. No
strong evidence of osteomyelitis.
2. Calcified peripheral vascular disease.

## 2020-02-04 MED ORDER — MAGNESIUM SULFATE 2 GM/50ML IV SOLN
2.0000 g | Freq: Once | INTRAVENOUS | Status: AC
  Administered 2020-02-04: 2 g via INTRAVENOUS
  Filled 2020-02-04: qty 50

## 2020-02-04 MED ORDER — POLYETHYLENE GLYCOL 3350 17 G PO PACK
17.0000 g | PACK | Freq: Two times a day (BID) | ORAL | Status: DC
  Filled 2020-02-04 (×5): qty 1

## 2020-02-04 MED ORDER — SENNA 8.6 MG PO TABS
1.0000 | ORAL_TABLET | Freq: Every day | ORAL | Status: DC
  Administered 2020-02-04: 8.6 mg via ORAL
  Filled 2020-02-04 (×5): qty 1

## 2020-02-04 MED ORDER — ACETAMINOPHEN 325 MG PO TABS: 650.0000 mg | ORAL_TABLET | Freq: Three times a day (TID) | ORAL | Status: DC

## 2020-02-04 MED ORDER — OXYCODONE HCL 5 MG PO TABS
10.0000 mg | ORAL_TABLET | ORAL | Status: DC
  Administered 2020-02-04 – 2020-02-11 (×38): 10 mg via ORAL
  Filled 2020-02-04 (×38): qty 2

## 2020-02-04 MED ORDER — OXYCODONE HCL 5 MG PO TABS: 10.0000 mg | ORAL_TABLET | Freq: Four times a day (QID) | ORAL | Status: DC

## 2020-02-04 MED ORDER — OXYCODONE HCL 5 MG PO TABS: 10.0000 mg | ORAL_TABLET | Freq: Two times a day (BID) | ORAL | Status: DC | PRN

## 2020-02-04 MED ORDER — DICLOFENAC SODIUM 1 % EX GEL
2.0000 g | Freq: Two times a day (BID) | CUTANEOUS | Status: DC
  Administered 2020-02-09: 2 g via TOPICAL
  Filled 2020-02-04 (×3): qty 100

## 2020-02-04 MED ORDER — COLLAGENASE 250 UNIT/GM EX OINT
TOPICAL_OINTMENT | Freq: Every day | CUTANEOUS | Status: DC
  Filled 2020-02-04 (×2): qty 30

## 2020-02-04 MED ORDER — OXYCODONE HCL 5 MG PO TABS
5.0000 mg | ORAL_TABLET | Freq: Two times a day (BID) | ORAL | Status: DC | PRN
  Administered 2020-02-05: 10 mg via ORAL
  Filled 2020-02-04: qty 2

## 2020-02-04 MED ORDER — ATORVASTATIN CALCIUM 40 MG PO TABS
40.0000 mg | ORAL_TABLET | Freq: Every day | ORAL | Status: DC
  Administered 2020-02-04 – 2020-02-12 (×9): 40 mg via ORAL
  Filled 2020-02-04 (×9): qty 1

## 2020-02-04 MED ORDER — ACETAMINOPHEN 500 MG PO TABS
1000.0000 mg | ORAL_TABLET | Freq: Three times a day (TID) | ORAL | Status: DC
  Administered 2020-02-04 – 2020-02-12 (×18): 1000 mg via ORAL
  Filled 2020-02-04 (×20): qty 2

## 2020-02-04 NOTE — Telephone Encounter (Addendum)
Thank you so much for calling him back. I agree with you in that his team should be his primary point of contact in the hospital for medications/recommendations about showering as they are in charge of his care while in-patient.  I will be sure to see him as soon as possible as an out-patient.   I reached out to his primary team to let them know he has some questions and concerns.

## 2020-02-04 NOTE — Progress Notes (Signed)
Report given to [place], Therapist, sports. Patient is alert and oriented x4 sitting up in bed with call light in reach. Lasix gtt infusing. ST on tele. May need ischemic cardiac workup prior to discharge. Dressings intact to bilateral feet.  Hgb 12.3, platelets 335, cholesterol 111, K+ 4.1, Creat 1.07 and Mg 1.8 this AM.

## 2020-02-04 NOTE — Telephone Encounter (Signed)
Patient states he is currently in the hospital and would like to speak with Dr. Johney Frame or a nurse.

## 2020-02-04 NOTE — Telephone Encounter (Signed)
Left message to call office

## 2020-02-04 NOTE — Telephone Encounter (Signed)
I spoke with patient.  He is currently in hospital.  Has questions regarding his medications and taking a shower.  I advised him to ask his questions to nurse who is caring for him in the hospital.  He would like to see Dr Johney Frame.  I explained to him he would be followed by our hospital team of physicians, PA's and NP's and that Dr Johney Frame is not in the hospital everyday.  He again requests he speak with or see Dr Johney Frame

## 2020-02-04 NOTE — Progress Notes (Signed)
FPTS Interim Progress Note  Personally reviewed and agree with x-ray findings of right foot which was notable for Soft tissue swelling at the prior 1st toe amputation site. Chronic area of mild cortical irregularity and lucency of metatarsal head appears stable since 01/14/20, arguing against active osteomyelitis.   Will hold off on Podiatry consult overnight.  Will discuss with day team concerning follow up MRI vs continuing to monitor. Do not feel it is urgently indicated at this time. However if change in clinic status, fever, or worsening physical exam findings will highly consider closer evaluation.  Danna Hefty, DO 02/04/2020, 11:56 PM PGY-3, [facility] pager [phone]

## 2020-02-04 NOTE — Progress Notes (Addendum)
Progress Note  Patient Name: Thomas [name] Date of Encounter: 02/04/2020  [facility] Cardiologist: Dr. Johney Frame  Subjective   Urinating frequently, wants to take a shower (no)  Inpatient Medications    Scheduled Meds:  apixaban  5 mg Oral BID   metoprolol tartrate  25 mg Oral TID   sodium chloride flush  3 mL Intravenous Q12H   Continuous Infusions:  sodium chloride     furosemide (LASIX) infusion 10 mg/hr (02/03/20 2248)   magnesium sulfate bolus IVPB     ondansetron (ZOFRAN) IV     PRN Meds: sodium chloride, acetaminophen, ondansetron (ZOFRAN) IV, oxyCODONE-acetaminophen **AND** oxyCODONE, sodium chloride flush   Vital Signs    Vitals:   02/03/20 2100 02/03/20 2123 02/04/20 0500 02/04/20 0605  BP: 106/77  114/89   Pulse: (!) 106  (!) 106   Resp: 18  18   Temp: 97.6 F (36.4 C)  97.6 F (36.4 C)   TempSrc: Oral  Oral   SpO2: 96% 97% 98% 96%  Weight:   96.1 kg   Height:        Intake/Output Summary (Last 24 hours) at 02/04/2020 0832 Last data filed at 02/04/2020 0727 Gross per 24 hour  Intake 527.3 ml  Output 2695 ml  Net -2167.7 ml   Last 3 Weights 02/04/2020 02/03/2020 02/02/2020  Weight (lbs) 211 lb 13.8 oz 220 lb 6.4 oz 223 lb 4.8 oz  Weight (kg) 96.1 kg 99.973 kg 101.288 kg      Telemetry    Atrial flutter, RVR- Personally Reviewed  ECG    Aflutter 2:1 AV conduction, HR 122 bpm - Personally Reviewed  Physical Exam   GEN: No acute distress.   Neck: Mild JVD Cardiac: rapid but fairly regular R&R, no murmur, no rubs, or gallops.  Respiratory: diminished to auscultation bilaterally with rales in the bases. GI: Soft, nontender, non-distended  MS: 1+ edema; No deformity. Neuro:  Nonfocal  Psych: Normal affect   Labs    High Sensitivity Troponin:  No results for input(s): TROPONINIHS in the last 720 hours.    Chemistry Recent Labs  Lab 02/02/20 2134 02/03/20 0421 02/04/20 0452  NA 137 138 139  K 3.9 3.9 4.1  CL 105 104 98    CO2 21* 23 31  GLUCOSE 153* 107* 105*  BUN 30* 26* 24*  CREATININE 0.93 0.92 1.07  CALCIUM 8.3* 8.5* 8.7*  GFRNONAA >60 >60 >60  ANIONGAP 11 11 10      Hematology Recent Labs  Lab 02/03/20 0421 02/04/20 0452  WBC 7.7 6.7  RBC 4.71 4.95  HGB 11.9* 12.3*  HCT 38.6* 40.7  MCV 82.0 82.2  MCH 25.3* 24.8*  MCHC 30.8 30.2  RDW 16.1* 16.1*  PLT 361 335   Lab Results  Component Value Date   TSH 5.337 (H) 02/03/2020   High Sensitivity Troponin:  No results for input(s): TROPONINIHS in the last 720 hours.    Lab Results  Component Value Date   CHOL 111 02/04/2020   HDL 32 (L) 02/04/2020   LDLCALC 65 02/04/2020   LDLDIRECT 94 12/06/2010   TRIG 72 02/04/2020   CHOLHDL 3.5 02/04/2020   Lab Results  Component Value Date   HGBA1C 6.2 (H) 02/03/2020     Radiology    DG CHEST PORT 1 VIEW  Result Date: 02/03/2020 CLINICAL DATA:  Shortness of breath EXAM: PORTABLE CHEST 1 VIEW COMPARISON:  11/18/2019 FINDINGS: Minimal atelectasis at the left lung base. Chronic mild interstitial prominence. No  pleural effusion pneumothorax. Similar cardiomediastinal contours. Heart size is likely normal portable technique. IMPRESSION: Minimal atelectasis at the left lung base. Electronically Signed   By: Macy Mis M.D.   On: 02/03/2020 08:07   ECHOCARDIOGRAM COMPLETE  Result Date: 02/02/2020    ECHOCARDIOGRAM REPORT   Patient Name:   Thomas [name] Date of Exam: 02/02/2020 Medical Rec #:  [id number]       Height:       76.0 in Accession #:    [id number]      Weight:       220.0 lb Date of Birth:  Oct 20, 1950       BSA:          2.307 m Patient Age:    69 years        BP:           129/62 mmHg Patient Gender: M               HR:           124 bpm. Exam Location:  Inpatient Procedure: 2D Echo, Cardiac Doppler and Color Doppler Indications:    Cardiomyopathy-Unspecified  History:        Patient has no prior history of Echocardiogram examinations.                 Arrythmias:Atrial Flutter,  Signs/Symptoms:Shortness of Breath;                 Risk Factors:Hypertension.  Sonographer:    Clayton Lefort RDCS (AE) Referring Phys: [id number] HEATHER E [place] IMPRESSIONS  1. While there is no visualized LV thrombus on sweeps of the apex, patient did not have IV access, unable to use echo contrast for confirmation. Left ventricular ejection fraction, by estimation, is <20%. The left ventricle has severely decreased function. The left ventricle demonstrates global hypokinesis. The left ventricular internal cavity size was moderately dilated. Left ventricular diastolic function could not be evaluated.  2. Right ventricular systolic function is normal. The right ventricular size is mildly enlarged. There is mildly elevated pulmonary artery systolic pressure.  3. Left atrial size was severely dilated.  4. Right atrial size was severely dilated.  5. Moderate pleural effusion in both left and right lateral regions.  6. The mitral valve is normal in structure. Mild mitral valve regurgitation.  7. The aortic valve is tricuspid. There is mild calcification of the aortic valve. Aortic valve regurgitation is not visualized. Mild aortic valve sclerosis is present, with no evidence of aortic valve stenosis.  8. The inferior vena cava is dilated in size with <50% respiratory variability, suggesting right atrial pressure of 15 mmHg. Conclusion(s)/Recommendation(s): Patient in atrial flutter with RVR during study. Discussed severely reduced EF with Dr. Gasper Sells, cardiologist on call. FINDINGS  Left Ventricle: While there is no visualized LV thrombus on sweeps of the apex, patient did not have IV access, unable to use echo contrast for confirmation. Left ventricular ejection fraction, by estimation, is <20%. The left ventricle has severely decreased function. The left ventricle demonstrates global hypokinesis. The left ventricular internal cavity size was moderately dilated. There is borderline left ventricular hypertrophy.  Left ventricular diastolic function could not be evaluated. Right Ventricle: The right ventricular size is mildly enlarged. Right vetricular wall thickness was not well visualized. Right ventricular systolic function is normal. There is mildly elevated pulmonary artery systolic pressure. The tricuspid regurgitant  velocity is 2.71 m/s, and with an assumed right atrial pressure of 15 mmHg, the estimated right  ventricular systolic pressure is 85.4 mmHg. Left Atrium: Left atrial size was severely dilated. Right Atrium: Right atrial size was severely dilated. Pericardium: Trivial pericardial effusion is present. Mitral Valve: The mitral valve is normal in structure. Mild mitral valve regurgitation. Tricuspid Valve: The tricuspid valve is normal in structure. Tricuspid valve regurgitation is mild. Aortic Valve: The aortic valve is tricuspid. There is mild calcification of the aortic valve. Aortic valve regurgitation is not visualized. Mild aortic valve sclerosis is present, with no evidence of aortic valve stenosis. Pulmonic Valve: The pulmonic valve was not well visualized. Pulmonic valve regurgitation is not visualized. No evidence of pulmonic stenosis. Aorta: The aortic root, ascending aorta and aortic arch are all structurally normal, with no evidence of dilitation or obstruction. Venous: The inferior vena cava is dilated in size with less than 50% respiratory variability, suggesting right atrial pressure of 15 mmHg. IAS/Shunts: The atrial septum is grossly normal. Additional Comments: There is a moderate pleural effusion in both left and right lateral regions.  LEFT VENTRICLE PLAX 2D LVIDd:         5.60 cm LVIDs:         5.30 cm LV PW:         1.10 cm LV IVS:        1.20 cm LVOT diam:     2.20 cm LV SV:         21 LV SV Index:   9 LVOT Area:     3.80 cm  RIGHT VENTRICLE             IVC RV Basal diam:  4.50 cm     IVC diam: 3.40 cm RV Mid diam:    3.90 cm RV S prime:     10.80 cm/s TAPSE (M-mode): 1.6 cm LEFT ATRIUM               Index       RIGHT ATRIUM           Index LA diam:        4.80 cm  2.08 cm/m  RA Area:     34.10 cm LA Vol (A2C):   122.0 ml 52.89 ml/m RA Volume:   139.00 ml 60.26 ml/m LA Vol (A4C):   97.7 ml  42.35 ml/m LA Biplane Vol: 112.0 ml 48.55 ml/m  AORTIC VALVE LVOT Vmax:   47.20 cm/s LVOT Vmean:  30.567 cm/s LVOT VTI:    0.055 m  AORTA Ao Root diam: 4.20 cm Ao Asc diam:  3.60 cm TRICUSPID VALVE TR Peak grad:   29.4 mmHg TR Vmax:        271.00 cm/s  SHUNTS Systemic VTI:  0.06 m Systemic Diam: 2.20 cm Buford Dresser MD Electronically signed by Buford Dresser MD Signature Date/Time: 02/02/2020/7:41:05 PM    Final     Cardiac Studies   Echo 02/02/20   1. While there is no visualized LV thrombus on sweeps of the apex,  patient did not have IV access, unable to use echo contrast for  confirmation. Left ventricular ejection fraction, by estimation, is <20%.  The left ventricle has severely decreased  function. The left ventricle demonstrates global hypokinesis. The left  ventricular internal cavity size was moderately dilated. Left ventricular  diastolic function could not be evaluated.   2. Right ventricular systolic function is normal. The right ventricular  size is mildly enlarged. There is mildly elevated pulmonary artery  systolic pressure.   3. Left atrial size was severely dilated.  4. Right atrial size was severely dilated.   5. Moderate pleural effusion in both left and right lateral regions.   6. The mitral valve is normal in structure. Mild mitral valve  regurgitation.   7. The aortic valve is tricuspid. There is mild calcification of the  aortic valve. Aortic valve regurgitation is not visualized. Mild aortic  valve sclerosis is present, with no evidence of aortic valve stenosis.   8. The inferior vena cava is dilated in size with <50% respiratory  variability, suggesting right atrial pressure of 15 mmHg.    Patient Profile     69 y.o. male with history  of HTN, prostate cancer, and atrial flutter not on [facility] who was admitted from the office 10/11 with aflutter RVR with HR 120-140s with lower leg edema.   Assessment & Plan    Aflutter RVR - recent dx - did not tolerate Dilt >> edema - CHA2DS2-VASc = 3 (age x 1, HTN, CHF) - Eliquis started this admit - severe biatrial dilatation, may need EP to see; not urgently will let EP see as he will need therapy throughout course - metop 25 mg tid helping some w/ rate control - TSH mildly elevated, will ck free T3 and T4  Acute systolic CHF/ Cardiomyopathy with EF<20% - echo results above, EF < 20% w/ nl RV function - wt down 12 lbs since admit, continue Lasix gtt - SBP 100s-110s, need to initiate HF therapy, will start losartan 12.5 mg daily - add BB as BP permits once more diuresed. - needs ischemic eval once resp status improved - lipid profile, A1c, TSH above. Will start statin - ck ez to make sure he has not had NSTEMI  For questions or updates, please contact [place] Please consult [email or web address] for contact info under        Signed, Rosaria Ferries, PA-C  02/04/2020, 8:32 AM      Personally seen and examined. Agree with APP Provider above with the following comments: Volume Overloaded from EF < 20% Needs GDMT losartan 12.5 mg start Need diuresis; likely will not be euvolemic in next few days AC will need switch to heparin prior to [facility] EP needed through course Continue diuresis Need assistance with pain management and would care (toes, dressing mgmt for R foot (non-oozing ulceration post ambutation).  Werner Lean, MD

## 2020-02-04 NOTE — Consult Note (Addendum)
FMTS Attending Consult Note: Thomas Singh, MD  Personal pager:  [phone] [facility] Service Pager:  [phone]   I  have seen and examined this patient, reviewed their chart. I have discussed this patient with the resident. I agree with the resident's findings, assessment and care plan.  Chronic Uncontrolled Pain, addendum to below:   69 year old with with medical history significant for rheumatoid arthritis, acute systolic heart failure, and atrial fibrillation with RVR presenting with uncontrolled pain, likely due to chronic underlying pain in setting of relatively acute postoperative pain.   69 year old with history significant for rheumatoid arthritis, atrial flutter, newly diagnosed acute systolic heart failure, and recent  Great toe amputation and left second metatarsal osteotomy presenting with shortness of breath due to acute heart systolic HF exacerbation and atrial flutter. Family Medicine consulted by Dr. Leward Quan for uncontrolled pain. Patient reports weeks of uncontrolled pain in the post operative period. He has had been on chronic narcotics, oxycodone 10-acetaminophen 325 every 4 hours as instructed by Pain Management for several years. Reports no adverse events, although wife has wanted him to cut down. Since being admitted, R great toe amputation site has 'split open'. He endorses radiating pain from the foot upwards. He has constipation related to narcotics. Denies fevers, chills, nausea, vomiting. Reports bilateral hip and back pain. Reports he feels his pain is uncontrolled.   PMH -significant for prostate cancer, newly diagnosed heart failure with reduced ejection fraction, atrial flutter, rheumatoid arthritis previously on some suppressive therapy with Enbrel  Past surgical history normal for bilateral knee replacements,, prior radiotherapy to the prostate amputation of right great toe in September 2021  He is a never smoker and lives with his wife.  He works as a Licensed conveyancer addended below   Uncontrolled Pain in setting of chronic narcotic use, rheumatoid arthritis and recent surgery with ongoing pain from nonhealing right foot surgical site  Concern for wound breakdown and possibly overlying infection. Recommend wound care consultation, consideration of x-ray at minimum. If worsens or progresses. Consider MRI.  Addendum to pain management plan - Oxycodone 10 mg q4h while awake (home dose) - Oxycodone 10 mg q12h for breakthrough (can increase to q8h PRN if needed) - Scheduled Tylenol - Declines Voltaren gel and topicals - Senna for constipation  - X-ray of right foot ordered (great toe amputation site), pending results, consider consulting with Dr. Posey Pronto -Recommend follow-up with Dr. Posey Pronto about removal of sutures from left surgical site, these are still in place and the procedure was approximately 3 weeks ago  - Appreciate Wound Ostomy care of right foot  - Tomorrow can consider discussion of gabapentin for neuropathic type pain    Family Medicine Teaching Service Daily Progress Note Intern Pager: [phone]  Patient name: Thomas [name] Medical record number: [id number] Date of birth: 1950/08/21 Age: 69 y.o. Gender: male  Primary Care Provider: [name], MD Code Status: full  Pt Overview and Major Events to Date:  Admitted 10/11 with aflutter RVR  Assessment and Plan: 69yo male admitted from cardiology office 10/11 for aflutter RVR. Has been worked up and medically managed by cardiology. FM consulted for addressing chronic pain.  Aflutter RVR - management per cardiology  Polyarthropathy- chronic pain in multiple joints due to osteoarthritis, surgery. Prior to admission, was chronically prescribed percocet 10-325mg  q4hrs PRN which moderately controlled his symptoms. He took this dose on regular 4hr intervals. Was able to perform daily activities on this pain regimen but  did not resolve pain completely. Has since had increased  pain from bilateral foot surgeries and wounds in healing state as well as illness. He is now having decreased tolerance of ADL such as ambulation due to pain and SOB. Also endorses chronic pain associated with prostate cancer, given diazepam 08/2019 for treatment which he states helped a lot with his pain. Last BM 2 days ago and not having discomfort currently from constipation. Has h/o constipation. Difficult to ambulate to bathroom currently with multiple medical devices attached. Recent labs show normal kidney/liver markers. - PDMP reviewed and consistent with patient's provided history. - scheduling oxy-IR 10mg  q4hrs. (90MME, which is home dose)  - PRN oxy-IR 10mg  BID PRN for breakthrough pain - scheduled tylenol 650mg  q8hrs - topical voltaren gel BID - recommended PT but patient declined. He will consider on OP basis after d/c. - adding on bowel regimen with goal of daily BMs. - encouraged patient to reach out to [place] if he needs anything else - we will continue to check in and reasses daily. - if foot pain not well controlled on this regimen, consider re-imaging - wound care following, appreciate recs  Remains inpatient appropriate because:Ongoing diagnostic testing needed not appropriate for outpatient work up  Subjective:  Patient is frustrated that his stay in hospital is longer than her expected and he is not getting his home pain medications even though his current pain is worse than baseline. His pain is chronic and in almost all joints "shorter list to tell you where it doesn't hurt". Biggest concern is his feet where he recently had surgery and has non-healing wounds. Pain, as well as WOB, prevents him from ambulating and bending.   Objective: Temp:  [97.6 F (36.4 C)-97.8 F (36.6 C)] 97.6 F (36.4 C) (10/13 0500) Pulse Rate:  [106-124] 106 (10/13 0500) Resp:  [18] 18 (10/13 0500) BP: (102-114)/(77-89) 114/89 (10/13 0500) SpO2:  [96 %-98 %] 96 % (10/13 0605) Weight:  [96.1 kg]  96.1 kg (10/13 0500) Physical Exam: General: well-appearing Irregular rate and rhythm.  Respiratory: CTAB. Currently on [place] Abdomen: soft, non-tender Extremities: non-infectious appearing open lesion on R great toe amputation site and left plantar surface, the site of suture removal is open with some white appearing discharge mild amount of surrounding erythema. . Bilateral lower extremity swelling. WWP bilaterally Bilateral surgical site incisions from knee replacements present.    Laboratory: Recent Labs  Lab 02/03/20 0421 02/04/20 0452  WBC 7.7 6.7  HGB 11.9* 12.3*  HCT 38.6* 40.7  PLT 361 335   Recent Labs  Lab 02/02/20 2134 02/03/20 0421 02/04/20 0452  NA 137 138 139  K 3.9 3.9 4.1  CL 105 104 98  CO2 21* 23 31  BUN 30* 26* 24*  CREATININE 0.93 0.92 1.07  CALCIUM 8.3* 8.5* 8.7*  GLUCOSE 153* 107* 105*    Imaging/Diagnostic Tests: Foot imaging 9/22 by podiatry was negative for signs of osteomyelitis.   Richarda Osmond, DO 02/04/2020, 11:32 AM PGY-3, [place] Intern pager: [phone], text pages welcome

## 2020-02-04 NOTE — Consult Note (Addendum)
[place] Nurse Consult Note: Reason for Consult:Surgical wound to right great toe amputation site.  Sutures removed and wound edges dehisced.  Now wound bed is thin slough .  No purulence. WBC 6.7 this AM.  Is edematous and tender to touch. Imaging studies were negative 01/14/20 Left plantar foot at third metatarsal with calloused nonintact lesion on plantar foot and suture line on dorsal foot.   Osteoarthritis is noted in history.  Wound type:surgical Pressure Injury POA: NA Measurement:  Right great toe amputation site:  0.2 cm x 5 cm slough to wound bed.  Wound WER:XVQM fibrin slough Drainage (amount, consistency, odor) minimal serosanguinous  No odor.  Periwound: Edema and erythema.  Dressing procedure/placement/frequency: Cleanse right foot surgical site with NS and pat dry.  Apply Santyl to wound bed.Cover with NS moist gauze. Secure with dry gauze and kerlix/tape.  Dry gauze to left foot wound   Change daily.  Will not follow at this time.  Please re-consult if needed.  Domenic Moras MSN, RN, FNP-BC CWON Wound, Ostomy, Continence Nurse Pager [phone]

## 2020-02-05 ENCOUNTER — Telehealth: Payer: Self-pay | Admitting: Podiatry

## 2020-02-05 DIAGNOSIS — M069 Rheumatoid arthritis, unspecified: Secondary | ICD-10-CM | POA: Diagnosis present

## 2020-02-05 DIAGNOSIS — I5023 Acute on chronic systolic (congestive) heart failure: Secondary | ICD-10-CM

## 2020-02-05 DIAGNOSIS — I5043 Acute on chronic combined systolic (congestive) and diastolic (congestive) heart failure: Secondary | ICD-10-CM | POA: Diagnosis not present

## 2020-02-05 DIAGNOSIS — I483 Typical atrial flutter: Secondary | ICD-10-CM | POA: Diagnosis not present

## 2020-02-05 DIAGNOSIS — I5021 Acute systolic (congestive) heart failure: Secondary | ICD-10-CM | POA: Diagnosis present

## 2020-02-05 LAB — BASIC METABOLIC PANEL
Anion gap: 11 (ref 5–15)
BUN: 25 mg/dL — ABNORMAL HIGH (ref 8–23)
CO2: 31 mmol/L (ref 22–32)
Calcium: 8.6 mg/dL — ABNORMAL LOW (ref 8.9–10.3)
Chloride: 96 mmol/L — ABNORMAL LOW (ref 98–111)
Creatinine, Ser: 1.12 mg/dL (ref 0.61–1.24)
GFR, Estimated: >60 mL/min (ref >60)
Glucose, Bld: 156 mg/dL — ABNORMAL HIGH (ref 70–99)
Potassium: 3.6 mmol/L (ref 3.5–5.1)
Sodium: 138 mmol/L (ref 135–145)

## 2020-02-05 LAB — CBC
HCT: 39 % (ref 39.0–52.0)
Hemoglobin: 11.8 g/dL — ABNORMAL LOW (ref 13.0–17.0)
MCH: 25 pg — ABNORMAL LOW (ref 26.0–34.0)
MCHC: 30.3 g/dL (ref 30.0–36.0)
MCV: 82.6 fL (ref 80.0–100.0)
Platelets: 317 10*3/uL (ref 150–400)
RBC: 4.72 MIL/uL (ref 4.22–5.81)
RDW: 15.9 % — ABNORMAL HIGH (ref 11.5–15.5)
WBC: 6.6 10*3/uL (ref 4.0–10.5)
nRBC: 0 % (ref 0.0–0.2)

## 2020-02-05 LAB — MAGNESIUM: Magnesium: 2 mg/dL (ref 1.7–2.4)

## 2020-02-05 LAB — T3, FREE: T3, Free: 2.3 pg/mL (ref 2.0–4.4)

## 2020-02-05 MED ORDER — LOSARTAN POTASSIUM 25 MG PO TABS: 12.5000 mg | ORAL_TABLET | Freq: Every day | ORAL | Status: DC

## 2020-02-05 MED ORDER — POTASSIUM CHLORIDE CRYS ER 20 MEQ PO TBCR: 40.0000 meq | EXTENDED_RELEASE_TABLET | Freq: Once | ORAL | Status: DC

## 2020-02-05 MED ORDER — DULOXETINE HCL 60 MG PO CPEP
60.0000 mg | ORAL_CAPSULE | Freq: Every day | ORAL | Status: DC
  Administered 2020-02-05 – 2020-02-12 (×8): 60 mg via ORAL
  Filled 2020-02-05 (×8): qty 1

## 2020-02-05 MED ORDER — OXYCODONE HCL 5 MG PO TABS
15.0000 mg | ORAL_TABLET | Freq: Two times a day (BID) | ORAL | Status: AC | PRN
  Administered 2020-02-05 – 2020-02-06 (×2): 15 mg via ORAL
  Filled 2020-02-05 (×2): qty 3

## 2020-02-05 NOTE — Progress Notes (Signed)
Family Medicine Teaching Service Daily Progress Note Intern Pager: [phone]  Patient name: Thomas [name] Medical record number: [id number] Date of birth: 1951-02-06 Age: 69 y.o. Gender: male  Primary Care Provider: [name], MD Consultants: Cardiology Code Status: Full  Pt Overview and Major Events to Date:  10/11 Admitted  Assessment and Plan:  Thomas [name] is a 69 yr old male who presents with SOB due to acute systolic HF exacerbation and a flutter, PMHx significant for rheumatoid arthritis, atrial flutter, newly diagnosed acute systolic HR with reduced EF, and recent Great toe amputation and left second metatarsal osteotomy   Polyarthropathy  Chronic Pain  Uncontrolled Right Foot Pain: Chronic pain in multiple joints due to osteoarthritis and recent surgery. Home medication is Percocet 10-325 q4 PRN. Previously able to do ADL's but recently unable to.  X-ray yesterday shows no evidence of osteomyelitis. Wound care has dressing recommendations but no further follow up needed. -Podiatry is consulted -Continue Oxycodone 10 mg q4h while awake (home dose) -Increase breakthrough Oxycodone to 15 mg q12h (can increase to q8h PRN if needed) -Scheduled Tylenol 1000 mg TID -Started Cymbalta 60 mg daily -Declines Voltaren gel and topicals   A flutter RVR: CHA2DS2-VASc score is 3 (age, HTN, CHF) Mag is 2.0 and K is 3.6.  Cardiology is managing  -Continue Metoprolol 25 mg TID -Continue Eliquis 5 mg BID -Per cards (10/12)  Keep K>4 and Mag >2, one dose of Kdur 40 mEq ordered   Acute systolic CHF/ Cardiomyopathy with EF<20% Cardiology is managing  -Continue IV Lasix infusion 10 mg/hr -Continue Losartan 12.5 mg daily   FEN/GI: 2 g Salt restricted 1500 cc fluid restricted/  Miralax and Senokot PPx: Eliquis   Disposition: Progressive  Prior to Admission Living Arrangement: Home Anticipated Discharge Location: Home Barriers to Discharge: Acute HF  Treatment Anticipated discharge in approximately 2-5 day(s).   Subjective:  Interviewed patient at bedside.  He reports severe pain in his leg and feet. States that during the day he can deal with his pain but cannot sleep with his current medication dosing. Discussed increasing the breakthrough dosing.   Objective: Temp:  [96.8 F (36 C)-97.5 F (36.4 C)] 96.8 F (36 C) (10/14 0425) Pulse Rate:  [116-122] 116 (10/14 0425) Resp:  [18] 18 (10/14 0425) BP: (109-123)/(88-93) 110/88 (10/14 0425) SpO2:  [96 %-99 %] 96 % (10/14 0425) Weight:  [94.8 kg] 94.8 kg (10/14 0425) Physical Exam:  Physical Exam Vitals and nursing note reviewed.  Constitutional:      General: He is not in acute distress.    Appearance: Normal appearance. He is normal weight. He is not ill-appearing, toxic-appearing or diaphoretic.  HENT:     Head: Normocephalic and atraumatic.  Cardiovascular:     Rate and Rhythm: Normal rate and regular rhythm.     Pulses: Normal pulses.          Radial pulses are 2+ on the right side and 2+ on the left side.       Dorsalis pedis pulses are 2+ on the right side and 2+ on the left side.     Heart sounds: Normal heart sounds, S1 normal and S2 normal. No murmur heard.      Comments: Edema extends up to midthigh  Pulmonary:     Effort: Pulmonary effort is normal. No respiratory distress.     Breath sounds: Normal breath sounds. No wheezing.  Abdominal:     General: There is no distension.     Palpations:  There is no mass.     Tenderness: There is no abdominal tenderness.  Musculoskeletal:     Right lower leg: 2+ Pitting Edema present.     Left lower leg: 2+ Pitting Edema present.       Feet:  Neurological:     Mental Status: He is alert. Mental status is at baseline.      Laboratory: Recent Labs  Lab 02/03/20 0421 02/04/20 0452 02/05/20 0255  WBC 7.7 6.7 6.6  HGB 11.9* 12.3* 11.8*  HCT 38.6* 40.7 39.0  PLT 361 335 317   Recent Labs  Lab 02/03/20 0421  02/04/20 0452 02/05/20 0255  NA 138 139 138  K 3.9 4.1 3.6  CL 104 98 96*  CO2 23 31 31   BUN 26* 24* 25*  CREATININE 0.92 1.07 1.12  CALCIUM 8.5* 8.7* 8.6*  GLUCOSE 107* 105* 156*    Mag: 2.0 K: 3.6   Imaging/Diagnostic Tests:   DG Toe Great Right: Prior 1st toe amputation with soft tissue swelling at the site. Underlying 1st metatarsal head appears stable since September. No strong evidence of osteomyelitis. Calcified peripheral vascular disease.   Thomas Cedar, MD 02/05/2020, 6:00 AM PGY-1, [place] Intern pager: [phone], text pages welcome

## 2020-02-05 NOTE — Progress Notes (Signed)
Report given to [place], Therapist, sports. Patient is alert and oriented x4 sitting up in bed with call light in reach. Lasix gtt infusing for diuresis. Plan is to continue gtt and possibly start Gabapentin if foot pain does not improve. May need ischemic cardiac workup prior to discharge. Dressings intact to bilateral feet.  Hgb 11.8, K+ 3.6, Creat 1.12 and Mg 2.0 this AM.

## 2020-02-05 NOTE — TOC Benefit Eligibility Note (Signed)
Transition of Care [facility]) Benefit Eligibility Note    Patient Details  Name: Thomas [name] MRN: [id number] Date of Birth: 07-30-1950   Medication/Dose: ELIQUIS  5 MG BID  Covered?: Yes  Tier: 3 Drug (PREFERRED)  Prescription Coverage Preferred Pharmacy: [facility]  Spoke with Person/Company/Phone Number:: COURTNEY @ [employer] # [phone]  Co-Pay: Johnsie Kindred  Prior Approval: No     Additional Notes: APIXABAN : Crecencio Mc Phone Number: 02/05/2020, 11:05 AM

## 2020-02-05 NOTE — Care Management (Signed)
02-05-20 0920 Benefits check submitted for Eliquis. Case Manager will follow for cost. Graves-Bigelow, Ocie Cornfield, RN,BSN Case Manager

## 2020-02-05 NOTE — Telephone Encounter (Signed)
Patient needs hospital consult for the following:   Lower extremity swollen Left foot ray amputation Right great toe amputation, weeping serosanguinous fluid  Right wound toe has dehissed  Xray not showing osteomyelitis   [phone] Patient Contact [phone] Patients wife   Patient located on Unit  [place] Room 4C-01 at [facility]

## 2020-02-05 NOTE — Progress Notes (Signed)
PCP Visit  Saw patient yesterday afternoon around 515  He is appreciative of his care.  He is concerned he is not getting enough pain medications.  We discussed that due to his chronic use of opiates it would be poor care to give him enough to take away all of his pain and that I thought his current regimen is a safe compromise.  He was understanding.  He would like to get out of the hospital as soon as possible.   We discussed likely will need a number of more days of diuresis and procedures to address causes of his heart failure and arrhythmias as well as his foot wounds .  Thanks to all for your care  Azzie Roup MD

## 2020-02-05 NOTE — Consult Note (Addendum)
Patient: Thomas [name] MRN: [id number] DOB: April 01, 1951 DOA: 02/02/2020  PODIATRY CONSULTATION  HPI: 69 y.o. male presenting today admitted for SOB due to acute systolic HF exacerbation and a flutter, PMHx significant for rheumatoid arthritis, b/l knee arthroplasty with implant, atrial flutter, newly diagnosed acute systolic HR with reduced EF, and recentGreat toe amputation and left second metatarsal osteotomy. Podiatry consulted for evaluation of recent b/l foot surgery.   Past Medical History:  Diagnosis Date  . Atrial flutter ([place]) 01/2020  . Cancer [facility])    prostate  . Claustrophobia    quite severe  . Heart failure with reduced ejection fraction ([place])   . Hypertension   . Hypoglycemia    occ  . Left knee DJD    Xray 12/23/08  . Prostate cancer [facility])    Patient Active Problem List   Diagnosis Date Noted  . Atrial flutter ([place]) 02/02/2020  . Lower extremity edema 12/30/2019  . Atrial tachycardia ([place]) 12/09/2019  . Weight loss, non-intentional 11/24/2019  . Hyperbilirubinemia 11/24/2019  . Chronic right hip pain 06/19/2019  . Degenerative tear of acetabular labrum of right hip 05/02/2019  . Primary osteoarthritis of right hip 05/02/2019  . Skin ulcer ([place]) 01/29/2019  . Malignant neoplasm of prostate ([place]) 01/16/2018  . Insomnia 10/02/2017  . Cyclic citrullinated peptide (CCP) antibody positive 05/31/2016  . S/P bilateral TKA 09/13/2015  . Other bilateral secondary osteoarthritis of knee 07/14/2014  . Heme positive stool 05/21/2013  . Osteoarthritis, multiple sites 01/13/2011  . Hypertension 12/06/2010  . ED (erectile dysfunction) 08/25/2010  . CLAUSTROPHOBIA 03/01/2010   CBC Latest Ref Rng & Units 02/05/2020 02/04/2020 02/03/2020  WBC 4.0 - 10.5 K/uL 6.6 6.7 7.7  Hemoglobin 13.0 - 17.0 g/dL 11.8(L) 12.3(L) 11.9(L)  Hematocrit 39 - 52 % 39.0 40.7 38.6(L)  Platelets 150 - 400 K/uL 317 335 361   BMP Latest Ref Rng & Units 02/05/2020 02/04/2020 02/03/2020    Glucose 70 - 99 mg/dL 156(H) 105(H) 107(H)  BUN 8 - 23 mg/dL 25(H) 24(H) 26(H)  Creatinine 0.61 - 1.24 mg/dL 1.12 1.07 0.92  BUN/Creat Ratio 10 - 24 - - -  Sodium 135 - 145 mmol/L 138 139 138  Potassium 3.5 - 5.1 mmol/L 3.6 4.1 3.9  Chloride 98 - 111 mmol/L 96(L) 98 104  CO2 22 - 32 mmol/L 31 31 23   Calcium 8.9 - 10.3 mg/dL 8.6(L) 8.7(L) 8.5(L)          Physical Exam: General: The patient is alert and oriented x3 in no acute distress.  Dermatology:  Wound #1 RT Hallux noted to the right great toe amputation site dehiscence.  Please see above photo.  Moderate amount of slough fibrin and necrotic tissue noted within the dehiscence site.  The area does not probe to bone.  No malodor noted.  No purulence noted.  Periwound is macerated.  No periwound erythema noted.  Heavy amount of serous drainage noted. Wound #2 left sub-2nd MTPJ measuring approximately 1.5 x 0.4 x 0.2 cm.  Again there is no exposed bone muscle tendon ligament or joint.  No malodor noted.  Ulcer is very dry and stable. Incision site in left forefoot incision appears well coapted.  No drainage noted.  No dehiscence noted.  Sutures intact.  No evidence or concern of infection.  Vascular: Diffuse peripheral cyanosis noted bilateral lower extremities.  Skin is cool to touch.  Edema noted extending up to the level of the knee.  Pedal pulses diminished.  Delayed capillary refill noted  bilateral feet.  Findings secondary to atrial flutter with severely reduced EF.  Neurological: Epicritic and protective threshold grossly intact bilaterally.   Musculoskeletal Exam: H/o RT great toe ampution and LT second metatarsal osteotomy DOS: 01/05/2020.  Diffuse chronic pain noted bilateral lower extremities.  Muscle strength 5/5 all compartments.  Radiographic Exam RT taken 02/04/2020:  1. Prior 1st toe amputation with soft tissue swelling at the site. Underlying 1st metatarsal head appears stable since September. No strong evidence of  osteomyelitis. 2. Calcified peripheral vascular disease.  Assessment and plan of care: 1. S/p RT hallux amputation. LT 2nd metatarsal osteotomy. DOS: 01/05/2020  2.  Dehiscence right great toe amputation stump.   -Recommend daily dressing changes.  Betadine wet-to-dry dressings daily.  Dressing change orders placed today.  No clinical evidence of infection  3.  Podiatry management will be continued outpatient.  Currently there is no need for return to the OR for intervention at this time.  Recommend good conservative wound care.  Follow-up in office after discharge   4.  Wound care: Wound care orders modified. Discontinue santyl. Apply betadine to all incision/wound sites. Dress with 4x4 gauze, kerlex, coflex or ace wrap. Daily.   -Podiatry to sign off.  Thank you for the consult.  Please feel free to contact me directly with any questions [phone]    Edrick Kins, DPM [facility]  Dr. Edrick Kins, DPM    [address], [place] [address]                Office [phone]  Fax [phone]

## 2020-02-05 NOTE — Progress Notes (Signed)
Progress Note  Patient Name: Thomas [name] Date of Encounter: 02/05/2020  Primary Cardiologist: Freada Bergeron, MD   Subjective   69 yo M with a history of HTN, LE edema and vascular issues leading to lower extremity digit amputations, chronic pain syndrome and prostate cancer who presents to clinic with AFl rates 125.  Presented with LV failure EF 15%.  Pain control is better.  -7 L negative since admission.  Inpatient Medications    Scheduled Meds:  acetaminophen  1,000 mg Oral TID   apixaban  5 mg Oral BID   atorvastatin  40 mg Oral Daily   collagenase   Topical Daily   diclofenac Sodium  2 g Topical BID   metoprolol tartrate  25 mg Oral TID   oxyCODONE  10 mg Oral Q4H while awake   polyethylene glycol  17 g Oral BID   senna  1 tablet Oral Daily   sodium chloride flush  3 mL Intravenous Q12H   Continuous Infusions:  sodium chloride     furosemide (LASIX) infusion 10 mg/hr (02/05/20 0007)   ondansetron (ZOFRAN) IV     PRN Meds: sodium chloride, ondansetron (ZOFRAN) IV, oxyCODONE, sodium chloride flush   Vital Signs    Vitals:   02/04/20 2200 02/04/20 2211 02/05/20 0425 02/05/20 0900  BP: (!) 109/92  110/88 111/84  Pulse: (!) 122  (!) 116 (!) 118  Resp: 18  18 20   Temp: (!) 97.5 F (36.4 C)  (!) 96.8 F (36 C) (!) 97.2 F (36.2 C)  TempSrc: Oral  Axillary Oral  SpO2: 99% 99% 96% 100%  Weight:   94.8 kg   Height:        Intake/Output Summary (Last 24 hours) at 02/05/2020 0936 Last data filed at 02/05/2020 0900 Gross per 24 hour  Intake 1555.94 ml  Output 4145 ml  Net -2589.06 ml   Filed Weights   02/03/20 0541 02/04/20 0500 02/05/20 0425  Weight: 100 kg 96.1 kg 94.8 kg    Telemetry    AFl 122 - Personally Reviewed  ECG    No new - Personally Reviewed  Physical Exam   GEN: No acute distress.   Neck: No JVD Cardiac: irregularly irregular, no murmurs, rubs, or gallops.  Respiratory: Clear to auscultation bilaterally. GI: Soft,  nontender, non-distended  MS: +3 edema (improved); Stable right and left wounds (only left wound unwrapped today) Neuro:  Nonfocal  Psych: Normal affect   Labs    Chemistry Recent Labs  Lab 02/03/20 0421 02/04/20 0452 02/05/20 0255  NA 138 139 138  K 3.9 4.1 3.6  CL 104 98 96*  CO2 23 31 31   GLUCOSE 107* 105* 156*  BUN 26* 24* 25*  CREATININE 0.92 1.07 1.12  CALCIUM 8.5* 8.7* 8.6*  GFRNONAA >60 >60 >60  ANIONGAP 11 10 11      Hematology Recent Labs  Lab 02/03/20 0421 02/04/20 0452 02/05/20 0255  WBC 7.7 6.7 6.6  RBC 4.71 4.95 4.72  HGB 11.9* 12.3* 11.8*  HCT 38.6* 40.7 39.0  MCV 82.0 82.2 82.6  MCH 25.3* 24.8* 25.0*  MCHC 30.8 30.2 30.3  RDW 16.1* 16.1* 15.9*  PLT 361 335 317    Radiology    DG Toe Great Right  Result Date: 02/04/2020 CLINICAL DATA:  69 year old male with pain. EXAM: RIGHT GREAT TOE COMPARISON:  Right foot series 01/14/2020. FINDINGS: Soft tissue swelling at the prior 1st toe amputation site. The underlying metatarsal head appears stable since 01/14/2020, arguing against  active osteomyelitis despite a persistent area of mild cortical irregularity and lucency. No new cortical irregularity. Extensive calcified peripheral vascular disease. Other visible osseous structures appear intact. IMPRESSION: 1. Prior 1st toe amputation with soft tissue swelling at the site. Underlying 1st metatarsal head appears stable since September. No strong evidence of osteomyelitis. 2. Calcified peripheral vascular disease. Electronically Signed   By: Genevie Ann M.D.   On: 02/04/2020 17:35    Cardiac Studies   Echo personally reviewed EF 15% 02/02/20 1. While there is no visualized LV thrombus on sweeps of the apex,  patient did not have IV access, unable to use echo contrast for  confirmation. Left ventricular ejection fraction, by estimation, is <20%.  The left ventricle has severely decreased  function. The left ventricle demonstrates global hypokinesis. The left    ventricular internal cavity size was moderately dilated. Left ventricular  diastolic function could not be evaluated.   2. Right ventricular systolic function is normal. The right ventricular  size is mildly enlarged. There is mildly elevated pulmonary artery  systolic pressure.   3. Left atrial size was severely dilated.   4. Right atrial size was severely dilated.   5. Moderate pleural effusion in both left and right lateral regions.   6. The mitral valve is normal in structure. Mild mitral valve  regurgitation.   7. The aortic valve is tricuspid. There is mild calcification of the  aortic valve. Aortic valve regurgitation is not visualized. Mild aortic  valve sclerosis is present, with no evidence of aortic valve stenosis.   8. The inferior vena cava is dilated in size with <50% respiratory  variability, suggesting right atrial pressure of 15 mmHg.   Patient Profile     69 y.o. male with A flutter, HFrEF and moderate pleural effusions  Assessment & Plan     Atrial Flutter  - EKG/tele consistent with new-onset atrial fibrillation. Risk factors include HF and age. CHADSVASC=2+  - Elevated TSH with normal Free T4 - Remain on telemetry.  - when near euvolemic eliquis -> heparin for LHC and RHC  - continue low dose metoprolol in the setting of HF; would not increase dose presently - Plan for TEE/DCCV through inpatient course  Acute Decompensated Heart Failure  Pleural Effusions - NYHA class , Stage C, hypervolemic, etiology from likely AFl vs Ischemic - Continue lasix drip with goal net negative 2-3L daily.  - given cholrthalidone allergies, will defer metolazone and increase drip to 15; holding losartan with this therapy - Strict I/Os, daily weights, and fluid restriction of < 2 L  - Replace electrolytes PRN and keep K>4 and Mg>2. - Daily BMP, Mg. - BB as above - Will eventually need MRA - Considering outpatient SGLT2i   Polyarthritis Chronic Pain - Appreciate FM  Recs  For questions or updates, please contact [facility] Please consult [email or web address] for contact info under Cardiology/STEMI.      Signed, Werner Lean, MD  02/05/2020, 9:36 AM

## 2020-02-05 NOTE — Telephone Encounter (Signed)
Rounded on patient. Podiatry signed off and he will follow up in office post-discharge. - Dr. Amalia Hailey

## 2020-02-06 DIAGNOSIS — M069 Rheumatoid arthritis, unspecified: Secondary | ICD-10-CM | POA: Diagnosis not present

## 2020-02-06 DIAGNOSIS — I5023 Acute on chronic systolic (congestive) heart failure: Secondary | ICD-10-CM | POA: Diagnosis not present

## 2020-02-06 DIAGNOSIS — I483 Typical atrial flutter: Secondary | ICD-10-CM | POA: Diagnosis not present

## 2020-02-06 LAB — BASIC METABOLIC PANEL
Anion gap: 12 (ref 5–15)
BUN: 28 mg/dL — ABNORMAL HIGH (ref 8–23)
CO2: 33 mmol/L — ABNORMAL HIGH (ref 22–32)
Calcium: 8.9 mg/dL (ref 8.9–10.3)
Chloride: 97 mmol/L — ABNORMAL LOW (ref 98–111)
Creatinine, Ser: 1.27 mg/dL — ABNORMAL HIGH (ref 0.61–1.24)
GFR, Estimated: 58 mL/min — ABNORMAL LOW (ref >60)
Glucose, Bld: 119 mg/dL — ABNORMAL HIGH (ref 70–99)
Potassium: 4 mmol/L (ref 3.5–5.1)
Sodium: 142 mmol/L (ref 135–145)

## 2020-02-06 LAB — CBC
HCT: 41.2 % (ref 39.0–52.0)
Hemoglobin: 12.7 g/dL — ABNORMAL LOW (ref 13.0–17.0)
MCH: 25.9 pg — ABNORMAL LOW (ref 26.0–34.0)
MCHC: 30.8 g/dL (ref 30.0–36.0)
MCV: 83.9 fL (ref 80.0–100.0)
Platelets: 340 10*3/uL (ref 150–400)
RBC: 4.91 MIL/uL (ref 4.22–5.81)
RDW: 15.9 % — ABNORMAL HIGH (ref 11.5–15.5)
WBC: 7.8 10*3/uL (ref 4.0–10.5)
nRBC: 0 % (ref 0.0–0.2)

## 2020-02-06 LAB — MAGNESIUM: Magnesium: 1.9 mg/dL (ref 1.7–2.4)

## 2020-02-06 MED ORDER — OXYCODONE HCL 5 MG PO TABS
15.0000 mg | ORAL_TABLET | Freq: Two times a day (BID) | ORAL | Status: DC | PRN
  Administered 2020-02-06: 15 mg via ORAL
  Filled 2020-02-06 (×2): qty 3

## 2020-02-06 NOTE — Evaluation (Signed)
Physical Therapy Evaluation Patient Details Name: Thomas [name] MRN: [id number] DOB: 1951/04/19 Today's Date: 02/06/2020   History of Present Illness   69 y.o. male with history of HTN, prostate cancer and atrial flutter not on [facility] who presented to clinic today with worsening SOB and LE edema found to be in aflutter with RVR HR 120-40s and grossly volume overloaded concerning for acute HF exacerbation. PMH: R hallux amputation last month, L foot ulcer debridement. Admitted 02/02/20 from his cardiologists office for treatment of CHF exacerbation  Clinical Impression  PTA pt living with wife in single story home with 2 steps to enter. 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 Pt is just getting ready to get into bed as he has just taken some pain medication and is wanting to take a nap. Pt agreeable to short PT Evaluation. Pt is limited in safe mobility by increased HR at rest and altered gait pattern to facilitate use of post op shoes. Pt will not need any PT services at discharge, however PT will continue to follow acutely to progress mobility.     Follow Up Recommendations No PT follow up;Supervision - Intermittent    Equipment Recommendations  None recommended by PT       Precautions / Restrictions Precautions Precautions: Fall Precaution Comments: post op shoe hazard Required Braces or Orthoses:  (bilateral post-op shoes) Restrictions Weight Bearing Restrictions: Yes Other Position/Activity Restrictions: NWB through forefoot, use post op shoes for ambulation       Mobility  Bed Mobility               General bed mobility comments: sitting EoB on entry   Transfers Overall transfer level: Modified independent                  Ambulation/Gait Ambulation/Gait assistance: Modified independent (Device/Increase time) Gait Distance (Feet): 150 Feet Assistive device: None Gait Pattern/deviations: Step-through  pattern Gait velocity: slowed   General Gait Details: gait deviations to account for post up shoes and associated mild instability, no overt LoB        Balance Overall balance assessment: Mild deficits observed, not formally tested                                           Pertinent Vitals/Pain Pain Assessment: No/denies pain    Home Living Family/patient expects to be discharged to:: Private residence Living Arrangements: Spouse/significant other;Children Available Help at Discharge: Family;Available 24 hours/day Type of Home: House Home Access: Stairs to enter Entrance Stairs-Rails: None Entrance Stairs-Number of Steps: 2 Home Layout: One level Home Equipment: Walker - 2 wheels;Bedside commode;Grab bars - tub/shower      Prior Function Level of Independence: Independent               Hand Dominance   Dominant Hand: Right    Extremity/Trunk Assessment   Upper Extremity Assessment Upper Extremity Assessment: Overall WFL for tasks assessed    Lower Extremity Assessment Lower Extremity Assessment: RLE deficits/detail;LLE deficits/detail RLE Deficits / Details: s/p R great toe amputation last month, recently dehisced, R TKA, ROM WFL, strength 5/5 LLE Deficits / Details: s/p L foot ulcer debridement, L TKA, ROM WFL, strength grossly 5/5       Communication   Communication: No difficulties  Cognition Arousal/Alertness: Awake/alert Behavior During Therapy: WFL for tasks  assessed/performed Overall Cognitive Status: Within Functional Limits for tasks assessed                                        General Comments General comments (skin integrity, edema, etc.): HR 116-117 throughout session        Assessment/Plan    PT Assessment Patient needs continued PT services  PT Problem List Decreased activity tolerance;Decreased safety awareness;Cardiopulmonary status limiting activity       PT Treatment Interventions DME  instruction;Gait training;Functional mobility training;Therapeutic activities;Therapeutic exercise;Balance training;Cognitive remediation;Patient/family education    PT Goals (Current goals can be found in the Care Plan section)  Acute Rehab PT Goals Patient Stated Goal: go home and get back to work PT Goal Formulation: With patient/family Time For Goal Achievement: 02/20/20 Potential to Achieve Goals: Good    Frequency Min 3X/week    AM-PAC PT "6 Clicks" Mobility  Outcome Measure Help needed turning from your back to your side while in a flat bed without using bedrails?: None Help needed moving from lying on your back to sitting on the side of a flat bed without using bedrails?: None Help needed moving to and from a bed to a chair (including a wheelchair)?: None Help needed standing up from a chair using your arms (e.g., wheelchair or bedside chair)?: None Help needed to walk in hospital room?: None Help needed climbing 3-5 steps with a railing? : None 6 Click Score: 24    End of Session   Activity Tolerance: Patient tolerated treatment well Patient left: in bed;with call bell/phone within reach;with family/visitor present Nurse Communication: Mobility status PT Visit Diagnosis: Other abnormalities of gait and mobility (R26.89)    Time: 3159-4585 PT Time Calculation (min) (ACUTE ONLY): 14 min   Charges:   PT Evaluation $PT Eval Moderate Complexity: 1 Mod          Graydon Fofana B. Migdalia Dk PT, DPT Acute Rehabilitation Services Pager [phone] Office [phone]   [place] 02/06/2020, 2:25 PM

## 2020-02-06 NOTE — Progress Notes (Signed)
Family Medicine Teaching Service Daily Progress Note Intern Pager: [phone]  Patient name: Thomas [name] Medical record number: [id number] Date of birth: [birth year] Age: 69 y.o. Gender: male  Primary Care Provider: [name], MD Consultants: Cardiology, Podiatry Code Status: Full  Pt Overview and Major Events to Date:  10/11 Admitted  Assessment and Plan:  Mr. Straus is a 69 yr old male who presents with SOB due to acute systolic HF exacerbation and a flutter, PMHx significant for rheumatoid arthritis, atrial flutter, newly diagnosed acute systolic HR with reduced EF, and recentGreat toe amputation and left second metatarsal osteotomy   Polyarthropathy  Chronic Pain  Uncontrolled Right Foot Pain: Chronic pain in multiple joints due to osteoarthritis and recent surgery. Home medication is Percocet 10-325 q4 PRN. Previously able to do ADL's but recently unable to.  Podiatry saw patient yesterday and recommend cleaning of his surgical site with Betadine but will wait until outpatient follow up for further interventions. Pain is improved, legs still tender to palpation but less than yesterday. -Continue Oxycodone 10 mg q4h while awake (home dose) -Increase breakthrough Oxycodone to 15 mg q12h (can increase to q8h PRN if needed) -Scheduled Tylenol 1000 mg TID -Continue Cymbalta 60 mg daily   A flutter RVR: CHA2DS2-VASc score is 3 (age, HTN, CHF) Mag is 2.0 and K is 3.6.  Cardiology is managing  -Continue Metoprolol 25 mg TID -Continue Eliquis 5 mg BID -Per cards (10/12)  Keep K>4 and Mag >2, one dose of Kdur 40 mEq ordered   Acute systolic CHF/ Cardiomyopathy with EF<20% Cardiology is managing  -Continue IV Lasix infusion 10 mg/hr -Continue Losartan 12.5 mg daily   FEN/GI:  2 g Salt restricted 1500 cc fluid restricted/  Miralax and Senokot PPx: Eliquis  Disposition: Progressive  Prior to Admission Living Arrangement: Home Anticipated Discharge  Location: Home Barriers to Discharge: Cardiac Management  Anticipated discharge in approximately 2-5 day(s).   Subjective:  Interviewed patient at bedside.  He reports that the new pain regiment did help control the pain. He reports he was able to sleep for a few hours. Has no other complaints at this time.   Objective: Temp:  [97.2 F (36.2 C)-97.7 F (36.5 C)] 97.7 F (36.5 C) (10/15 0415) Pulse Rate:  [115-120] 115 (10/15 0415) Resp:  [18-20] 18 (10/15 0057) BP: (102-112)/(74-85) 112/77 (10/15 0415) SpO2:  [93 %-100 %] 99 % (10/15 0415) Weight:  [93.5 kg] 93.5 kg (10/15 0415) Physical Exam:  Physical Exam Vitals and nursing note reviewed.  Constitutional:      General: He is not in acute distress.    Appearance: Normal appearance. He is normal weight. He is not ill-appearing, toxic-appearing or diaphoretic.  HENT:     Head: Normocephalic and atraumatic.  Cardiovascular:     Rate and Rhythm: Regular rhythm. Tachycardia present.     Pulses: Normal pulses.          Radial pulses are 2+ on the right side and 2+ on the left side.       Dorsalis pedis pulses are 2+ on the right side and 2+ on the left side.     Heart sounds: Normal heart sounds, S1 normal and S2 normal. No murmur heard.      Comments: Edema up to midthigh Pulmonary:     Effort: Pulmonary effort is normal. No respiratory distress.     Breath sounds: Normal breath sounds. No wheezing.  Abdominal:     General: There is no distension.  Tenderness: There is no abdominal tenderness.  Musculoskeletal:     Right lower leg: 2+ Pitting Edema present.     Left lower leg: 2+ Pitting Edema present.  Neurological:     Mental Status: He is alert. Mental status is at baseline.  Psychiatric:        Mood and Affect: Mood normal.        Behavior: Behavior normal.        Thought Content: Thought content normal.      Laboratory: Recent Labs  Lab 02/04/20 0452 02/05/20 0255 02/06/20 0222  WBC 6.7 6.6 7.8  HGB  12.3* 11.8* 12.7*  HCT 40.7 39.0 41.2  PLT 335 317 340   Recent Labs  Lab 02/04/20 0452 02/05/20 0255 02/06/20 0222  NA 139 138 142  K 4.1 3.6 4.0  CL 98 96* 97*  CO2 31 31 33*  BUN 24* 25* 28*  CREATININE 1.07 1.12 1.27*  CALCIUM 8.7* 8.6* 8.9  GLUCOSE 105* 156* 119*    Mag: 1.9  Imaging/Diagnostic Tests:   DG Toe Great Right: Prior 1st toe amputation with soft tissue swelling at the site. Underlying 1st metatarsal head appears stable since September. No strong evidence of osteomyelitis. Calcified peripheral vascular disease.   Briant Cedar, MD 02/06/2020, 6:13 AM PGY-1, [place] Intern pager: [phone], text pages welcome

## 2020-02-06 NOTE — Progress Notes (Addendum)
Progress Note  Patient Name: Thomas [name] Date of Encounter: 02/06/2020  [facility] Cardiologist: Freada Bergeron, MD   Subjective   Patient put out -2.5L overnight. Still in [place] with rates 110-120. Kidney function mildly up today. Patient denies chest pain. He has occasional sob.   Inpatient Medications    Scheduled Meds:  acetaminophen  1,000 mg Oral TID   apixaban  5 mg Oral BID   atorvastatin  40 mg Oral Daily   collagenase   Topical Daily   diclofenac Sodium  2 g Topical BID   DULoxetine  60 mg Oral Daily   losartan  12.5 mg Oral Daily   metoprolol tartrate  25 mg Oral TID   oxyCODONE  10 mg Oral Q4H while awake   polyethylene glycol  17 g Oral BID   senna  1 tablet Oral Daily   sodium chloride flush  3 mL Intravenous Q12H   Continuous Infusions:  sodium chloride     furosemide (LASIX) infusion 15 mg/hr (02/05/20 2213)   ondansetron (ZOFRAN) IV     PRN Meds: sodium chloride, ondansetron (ZOFRAN) IV, sodium chloride flush   Vital Signs    Vitals:   02/05/20 2017 02/05/20 2210 02/06/20 0057 02/06/20 0415  BP: 102/85  102/77 112/77  Pulse: (!) 118  (!) 116 (!) 115  Resp: 18  18   Temp: 97.6 F (36.4 C)  (!) 97.5 F (36.4 C) 97.7 F (36.5 C)  TempSrc: Oral  Oral Oral  SpO2: 94% 97% 96% 99%  Weight:    93.5 kg  Height:        Intake/Output Summary (Last 24 hours) at 02/06/2020 9030 Last data filed at 02/06/2020 0923 Gross per 24 hour  Intake 879.59 ml  Output 3470 ml  Net -2590.41 ml   Last 3 Weights 02/06/2020 02/05/2020 02/04/2020  Weight (lbs) 206 lb 2.1 oz 208 lb 15.9 oz 211 lb 13.8 oz  Weight (kg) 93.5 kg 94.8 kg 96.1 kg      Telemetry    Aflutter, HR 110-120 - Personally Reviewed  ECG    No new - Personally Reviewed  Physical Exam   GEN: No acute distress.   Neck: No JVD Cardiac: RRR, no murmurs, rubs, or gallops.  Respiratory: Clear to auscultation bilaterally. GI: Soft, nontender, non-distended  MS: 3+B/L lower  leg edema; No deformity. Neuro:  Nonfocal  Psych: Normal affect   Labs    High Sensitivity Troponin:   Recent Labs  Lab 02/04/20 0948 02/04/20 1112  TROPONINIHS 18* 23*      Chemistry Recent Labs  Lab 02/04/20 0452 02/05/20 0255 02/06/20 0222  NA 139 138 142  K 4.1 3.6 4.0  CL 98 96* 97*  CO2 31 31 33*  GLUCOSE 105* 156* 119*  BUN 24* 25* 28*  CREATININE 1.07 1.12 1.27*  CALCIUM 8.7* 8.6* 8.9  GFRNONAA >60 >60 58*  ANIONGAP 10 11 12      Hematology Recent Labs  Lab 02/04/20 0452 02/05/20 0255 02/06/20 0222  WBC 6.7 6.6 7.8  RBC 4.95 4.72 4.91  HGB 12.3* 11.8* 12.7*  HCT 40.7 39.0 41.2  MCV 82.2 82.6 83.9  MCH 24.8* 25.0* 25.9*  MCHC 30.2 30.3 30.8  RDW 16.1* 15.9* 15.9*  PLT 335 317 340    BNPNo results for input(s): BNP, PROBNP in the last 168 hours.   DDimer No results for input(s): DDIMER in the last 168 hours.   Radiology    DG Toe Great Right  Result Date: 02/04/2020 CLINICAL DATA:  69 year old male with pain. EXAM: RIGHT GREAT TOE COMPARISON:  Right foot series 01/14/2020. FINDINGS: Soft tissue swelling at the prior 1st toe amputation site. The underlying metatarsal head appears stable since 01/14/2020, arguing against active osteomyelitis despite a persistent area of mild cortical irregularity and lucency. No new cortical irregularity. Extensive calcified peripheral vascular disease. Other visible osseous structures appear intact. IMPRESSION: 1. Prior 1st toe amputation with soft tissue swelling at the site. Underlying 1st metatarsal head appears stable since September. No strong evidence of osteomyelitis. 2. Calcified peripheral vascular disease. Electronically Signed   By: Genevie Ann M.D.   On: 02/04/2020 17:35    Cardiac Studies     Echo personally reviewed EF 15% 02/02/20 1. While there is no visualized LV thrombus on sweeps of the apex,  patient did not have IV access, unable to use echo contrast for  confirmation. Left ventricular ejection  fraction, by estimation, is <20%.  The left ventricle has severely decreased  function. The left ventricle demonstrates global hypokinesis. The left  ventricular internal cavity size was moderately dilated. Left ventricular  diastolic function could not be evaluated.   2. Right ventricular systolic function is normal. The right ventricular  size is mildly enlarged. There is mildly elevated pulmonary artery  systolic pressure.   3. Left atrial size was severely dilated.   4. Right atrial size was severely dilated.   5. Moderate pleural effusion in both left and right lateral regions.   6. The mitral valve is normal in structure. Mild mitral valve  regurgitation.   7. The aortic valve is tricuspid. There is mild calcification of the  aortic valve. Aortic valve regurgitation is not visualized. Mild aortic  valve sclerosis is present, with no evidence of aortic valve stenosis.   8. The inferior vena cava is dilated in size with <50% respiratory  variability, suggesting right atrial pressure of 15 mmHg.   Patient Profile     69 y.o. male with aflutter found to have reduced EF and moderate pleural effusions.   Assessment & Plan    Aflutter - EKG/tele showed new onset afib/flutter. Still in flutter with elevated rates - CHADSVASC at least 3 (age, HTN, CHF) - TSH up with normal T4 - continue Eliquis>> Transition to heparin 02/07/20 for possible L and R HC 02/08/20; have not placed case request in; pending appropriate diuresis over this weekend - continue low dose metoprolol - Plan for TEE/DCCV when euvolemic peridischarge  Acute systolic CHF/pleural effusions -On lasix drip - Urine output overnight -2.5, net -9.6L - weight down 223>206 - creatinine 1.12>1.27. still has evidence of intravascular volume overload; will continue drip - continue BB.  - Will eventually need more aggressive CHF directed therapy - Still volume overloaded on exam. Continue diuresis  Recent b/l foot  surgery - Podiatry consulted and signed off, further work-up as OP  Chronic pain - IM following  For questions or updates, please contact [facility] Please consult [email or web address] for contact info under        Signed, Cadence Ninfa Meeker, PA-C  02/06/2020, 7:02 AM    Personally seen and examined. Agree with APP Provider above with the following comments:  Briefly 69 yo M with new Afl and HFrEF EF <20% seen in clinic.  Patient has no prior cardiac hx.  Aggressive diuresis (despite small increase in creatinine) with plans to define anatomy, start GDMT, and aggressive rhythm control strategy once euvolemic.  Patients social determinants of health (wife  lost his job, worried he will lose his house) and chronic pain complicate issues.  Werner Lean, MD

## 2020-02-06 NOTE — Care Management Important Message (Signed)
Important Message  Patient Details  Name: Thomas [name] MRN: [id number] Date of Birth: [birth year]   Medicare Important Message Given:  Yes     [name] 02/06/2020, 2:12 PM

## 2020-02-06 NOTE — Progress Notes (Signed)
Report given to [place], Therapist, sports. Patient is alert and oriented x4 sitting up on side of bed eating breakfast with call light in reach. IV Lasix gtt infusing.  Patient requesting that attending MD come and thoroughly explain all medications and plan of care to him in detail this AM. Podiatry has signed off. Dressing changes to right foot remain daily. Plan is to be [place] home in 1-2 days after adequate diuresing. Hgb 12.7, platelets 340, K+ 4.0, Creat 1.27 and Mg 1.9 this AM.

## 2020-02-07 DIAGNOSIS — I483 Typical atrial flutter: Secondary | ICD-10-CM | POA: Diagnosis not present

## 2020-02-07 DIAGNOSIS — M79671 Pain in right foot: Secondary | ICD-10-CM | POA: Diagnosis not present

## 2020-02-07 DIAGNOSIS — R0602 Shortness of breath: Secondary | ICD-10-CM | POA: Diagnosis present

## 2020-02-07 DIAGNOSIS — M069 Rheumatoid arthritis, unspecified: Secondary | ICD-10-CM | POA: Diagnosis not present

## 2020-02-07 DIAGNOSIS — I5023 Acute on chronic systolic (congestive) heart failure: Secondary | ICD-10-CM | POA: Diagnosis not present

## 2020-02-07 LAB — CBC
HCT: 39.7 % (ref 39.0–52.0)
Hemoglobin: 11.9 g/dL — ABNORMAL LOW (ref 13.0–17.0)
MCH: 24.8 pg — ABNORMAL LOW (ref 26.0–34.0)
MCHC: 30 g/dL (ref 30.0–36.0)
MCV: 82.9 fL (ref 80.0–100.0)
Platelets: 311 10*3/uL (ref 150–400)
RBC: 4.79 MIL/uL (ref 4.22–5.81)
RDW: 15.7 % — ABNORMAL HIGH (ref 11.5–15.5)
WBC: 6.6 10*3/uL (ref 4.0–10.5)
nRBC: 0 % (ref 0.0–0.2)

## 2020-02-07 LAB — BASIC METABOLIC PANEL
Anion gap: 11 (ref 5–15)
BUN: 26 mg/dL — ABNORMAL HIGH (ref 8–23)
CO2: 35 mmol/L — ABNORMAL HIGH (ref 22–32)
Calcium: 9 mg/dL (ref 8.9–10.3)
Chloride: 92 mmol/L — ABNORMAL LOW (ref 98–111)
Creatinine, Ser: 1.11 mg/dL (ref 0.61–1.24)
GFR, Estimated: >60 mL/min (ref >60)
Glucose, Bld: 105 mg/dL — ABNORMAL HIGH (ref 70–99)
Potassium: 3.4 mmol/L — ABNORMAL LOW (ref 3.5–5.1)
Sodium: 138 mmol/L (ref 135–145)

## 2020-02-07 LAB — MAGNESIUM: Magnesium: 1.8 mg/dL (ref 1.7–2.4)

## 2020-02-07 MED ORDER — POTASSIUM CHLORIDE 20 MEQ PO PACK
40.0000 meq | PACK | Freq: Once | ORAL | Status: AC
  Administered 2020-02-07: 40 meq via ORAL
  Filled 2020-02-07: qty 2

## 2020-02-07 MED ORDER — POTASSIUM CHLORIDE CRYS ER 20 MEQ PO TBCR: 40.0000 meq | EXTENDED_RELEASE_TABLET | Freq: Once | ORAL | Status: DC

## 2020-02-07 MED ORDER — MAGNESIUM SULFATE 2 GM/50ML IV SOLN
2.0000 g | Freq: Once | INTRAVENOUS | Status: AC
  Administered 2020-02-07: 2 g via INTRAVENOUS
  Filled 2020-02-07: qty 50

## 2020-02-07 MED ORDER — OXYCODONE HCL 5 MG PO TABS
10.0000 mg | ORAL_TABLET | Freq: Two times a day (BID) | ORAL | Status: AC | PRN
  Administered 2020-02-07: 10 mg via ORAL

## 2020-02-07 NOTE — Progress Notes (Signed)
Progress Note  Patient Name: Thomas [name] Date of Encounter: 02/07/2020  [facility] Cardiologist: Freada Bergeron, MD   Subjective  Patient states that his LE edema is improving. Shortness of breath is much better. States pain is well controlled  Net negative 1.2L. Cr improved to 1.11  Inpatient Medications    Scheduled Meds: . acetaminophen  1,000 mg Oral TID  . apixaban  5 mg Oral BID  . atorvastatin  40 mg Oral Daily  . collagenase   Topical Daily  . diclofenac Sodium  2 g Topical BID  . DULoxetine  60 mg Oral Daily  . metoprolol tartrate  25 mg Oral TID  . oxyCODONE  10 mg Oral Q4H while awake  . polyethylene glycol  17 g Oral BID  . senna  1 tablet Oral Daily  . sodium chloride flush  3 mL Intravenous Q12H   Continuous Infusions: . sodium chloride    . furosemide (LASIX) infusion 15 mg/hr (02/07/20 1041)  . ondansetron (ZOFRAN) IV     PRN Meds: sodium chloride, ondansetron (ZOFRAN) IV, sodium chloride flush   Vital Signs    Vitals:   02/06/20 2349 02/07/20 0200 02/07/20 0415 02/07/20 0812  BP: 94/65 105/83 109/74 122/63  Pulse: 85  (!) 107 98  Resp: 20  18 18   Temp: (!) 97.3 F (36.3 C)  (!) 97.4 F (36.3 C) 98.1 F (36.7 C)  TempSrc: Oral  Oral Oral  SpO2: 100%  96% 98%  Weight:   91 kg   Height:        Intake/Output Summary (Last 24 hours) at 02/07/2020 1129 Last data filed at 02/07/2020 1100 Gross per 24 hour  Intake 393.06 ml  Output 2625 ml  Net -2231.94 ml   Last 3 Weights 02/07/2020 02/06/2020 02/05/2020  Weight (lbs) 200 lb 9.9 oz 206 lb 2.1 oz 208 lb 15.9 oz  Weight (kg) 91 kg 93.5 kg 94.8 kg      Telemetry    Aflutter with HR 110s - Personally Reviewed  ECG    No new ECGs- Personally Reviewed  Physical Exam   GEN: Comfortable, speaking in full sentences without SOB   Neck: JVD midneck Cardiac: Tachycardic, regular, no murmurs.  Respiratory: Crackles at the bases GI: Soft, nontender, non-distended  MS: 3+  pitting edema to the thighs. Warm Neuro:  Nonfocal  Psych: Normal affect   Labs    High Sensitivity Troponin:   Recent Labs  Lab 02/04/20 0948 02/04/20 1112  TROPONINIHS 18* 23*      Chemistry Recent Labs  Lab 02/05/20 0255 02/06/20 0222 02/07/20 0143  NA 138 142 138  K 3.6 4.0 3.4*  CL 96* 97* 92*  CO2 31 33* 35*  GLUCOSE 156* 119* 105*  BUN 25* 28* 26*  CREATININE 1.12 1.27* 1.11  CALCIUM 8.6* 8.9 9.0  GFRNONAA >60 58* >60  ANIONGAP 11 12 11      Hematology Recent Labs  Lab 02/05/20 0255 02/06/20 0222 02/07/20 0143  WBC 6.6 7.8 6.6  RBC 4.72 4.91 4.79  HGB 11.8* 12.7* 11.9*  HCT 39.0 41.2 39.7  MCV 82.6 83.9 82.9  MCH 25.0* 25.9* 24.8*  MCHC 30.3 30.8 30.0  RDW 15.9* 15.9* 15.7*  PLT 317 340 311    BNPNo results for input(s): BNP, PROBNP in the last 168 hours.   DDimer No results for input(s): DDIMER in the last 168 hours.   Radiology    No results found.  Cardiac Studies  Echo personally reviewed EF 15% 02/02/20 1. While there is no visualized LV thrombus on sweeps of the apex,  patient did not have IV access, unable to use echo contrast for  confirmation. Left ventricular ejection fraction, by estimation, is <20%.  The left ventricle has severely decreased  function. The left ventricle demonstrates global hypokinesis. The left  ventricular internal cavity size was moderately dilated. Left ventricular  diastolic function could not be evaluated.  2. Right ventricular systolic function is normal. The right ventricular  size is mildly enlarged. There is mildly elevated pulmonary artery  systolic pressure.  3. Left atrial size was severely dilated.  4. Right atrial size was severely dilated.  5. Moderate pleural effusion in both left and right lateral regions.  6. The mitral valve is normal in structure. Mild mitral valve  regurgitation.  7. The aortic valve is tricuspid. There is mild calcification of the  aortic valve. Aortic valve  regurgitation is not visualized. Mild aortic  valve sclerosis is present, with no evidence of aortic valve stenosis.  8. The inferior vena cava is dilated in size with <50% respiratory  variability, suggesting right atrial pressure of 15 mmHg.   Patient Profile     69 y.o. 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  Assessment & Plan    #Aflutter: Remains in flutter with HR 110-120s. CHADs-vasc 3. -Continue heparin gtt for plans for possible R and LHC on 02/08/20 -Continue low dose metoprolol for rate control -Plan for TEE/DCCV once more euvolemic  #Acute systolic heart failure: TTE with LVEF <20% in the setting of Aflutter with RVR.  -Continue aggressive diuresis with lasix gtt -Continue low dose metop -Once more stable, will need HF medication optimization (entresto, spironolactone)  #Chronic Pain: -IM following, appreciate recommendations  For questions or updates, please contact [facility] Please consult [email or web address] for contact info under        Signed, Freada Bergeron, MD  02/07/2020, 11:29 AM

## 2020-02-07 NOTE — Progress Notes (Signed)
Family Medicine Teaching Service Daily Progress Note Intern Pager: [phone]  Patient name: Thomas [name] Medical record number: [id number] Date of birth: [birth year] Age: 69 y.o. Gender: male  Primary Care Provider: [name], MD Consultants: Cardiology, Podiatry Code Status: FULL   Pt Overview and Major Events to Date:  10/11 Admitted  Assessment and Plan: Thomas [name] is a69 yr oldmale who presentswith SOBdue to acute systolic HF exacerbation and a flutter, PMHxsignificant for rheumatoid arthritis, atrial flutter, newly diagnosed acute systolicHR with reduced EF, and recentGreat toe amputation and left second metatarsal osteotomy  Polyarthropathy Chronic Pain  Uncontrolled Right Foot Pain: Pt endorses his chronic pain is well controlled today, he feels like his pain regime is ok for now and does not need to go up on oxycodone dose. Pt saw pt yesterday who recommended no follow up or equipment. -ContinueOxycodone 10 mg q4h while awake (home dose) -Scheduled Tylenol1000 mg TID -Continue Cymbalta 60 mg daily -If pain worsens can add on lidocaine patch or gabapentin -Could sign off today as pain is stable and improved -Continue Miralax 17g BID   A flutter RVR: CHA2DS2-VASc score is 3 (age, HTN, CHF) Mag is 2.0 and K is 3.6. Cardiology fo -Continue Metoprolol 25 mg TID -Continue Eliquis 5 mg BID -Per cards (10/12)Keep K>4 and Mag >2, one dose of Kdur 40 mEq ordered   Acute systolic CHF/ Cardiomyopathy with EF<20% Cardiology following, appreciate recs -Continue IV Lasix infusion 10 mg/hr  FEN/GI: 2 g Salt restricted1500 cc fluid restricted/ Miralax and Senokot PPx: Eliquis  Subjective:  No acute events overnight. Pain well controlled on current pain regime.  Objective: Temp:  [97.2 F (36.2 C)-98 F (36.7 C)] 97.3 F (36.3 C) (10/15 2349) Pulse Rate:  [85-116] 85 (10/15 2349) Resp:  [16-20] 20 (10/15 2349) BP: (94-112)/(65-86) 94/65  (10/15 2349) SpO2:  [93 %-100 %] 100 % (10/15 2349) Weight:  [93.5 kg] 93.5 kg (10/15 0415)   Physical Exam: General: 69 yr old male lying in bed, pleasant, no acute distress Cardiovascular: S1, s2 present, RRR Respiratory: CTAB, normal WOB Abdomen: soft, non tender, bowel sounds present  Extremities: peripheral edema 2+ bilaterally up to knees   Laboratory: Recent Labs  Lab 02/04/20 0452 02/05/20 0255 02/06/20 0222  WBC 6.7 6.6 7.8  HGB 12.3* 11.8* 12.7*  HCT 40.7 39.0 41.2  PLT 335 317 340   Recent Labs  Lab 02/04/20 0452 02/05/20 0255 02/06/20 0222  NA 139 138 142  K 4.1 3.6 4.0  CL 98 96* 97*  CO2 31 31 33*  BUN 24* 25* 28*  CREATININE 1.07 1.12 1.27*  CALCIUM 8.7* 8.6* 8.9  GLUCOSE 105* 156* 119*     Imaging/Diagnostic Tests: No results found.  Lattie Haw, MD 02/07/2020, 12:46 AM PGY-2, [place] Intern pager: [phone]

## 2020-02-07 NOTE — Progress Notes (Signed)
Pt states he has had an ongoing problem with swallowing medications and some consistencies of food. Per pt he has learned to compensate by breaking pills in half and dissolving some in water. He also uses thicker consistency of beverages to take his pills at home. During med pass this morning one of the pills he had broken in half was stuck in his throat until he was able to clear it himself. He also mentioned that his mother and brother had similar problems, with his mother requiring the heimlich maneuver several times, and his brother actually choked to death on a sandwich. Family med resident made aware. Order received for SLP eval.

## 2020-02-07 NOTE — Progress Notes (Signed)
OT Cancellation Note  Patient Details Name: RAFEL GARDE MRN: [id number] DOB: 16-Sep-1950   Cancelled Treatment:    Reason Eval/Treat Not Completed: OT screened, no needs identified, will sign off  Metta Clines 02/07/2020, 3:29 PM  02/07/2020  Denice Paradise, OTR/L  Acute Rehabilitation Services  Office:  [phone]

## 2020-02-07 NOTE — Progress Notes (Signed)
SLP Cancellation Note  Patient Details Name: Thomas [name] MRN: [id number] DOB: May 24, 1950   Cancelled treatment:        Attempted to see pt for clinical swallow evaluation.  Spoke with RN; pt resting after sleeplessness and agitation.  Will reattempt next date as SLP schedule permits.   Celedonio Savage, MA, [place] Office: [phone]; Pager [phone]): [phone] 02/07/2020, 3:46 PM

## 2020-02-07 NOTE — Progress Notes (Signed)
Report given to Belenda Cruise, Therapist, sports. Patient is alert and oriented x4 sitting up in bed sleeping. Respirations even and unlabored. VSS. IV Lasix gtt infusing. Family med MD has rounded on patient this AM and all questions answered with RN at bedside. Scheduled pain medication tolerated well by patient with adequate relief noted.  Daily dressing due for right great toe incision/wound (wound changed on AM shift). Cardiology following and ischemic workup/DCCV vs right and left heart cath still the plan for as early as Monday.  Hgb 11.9, platelets 311, K+ 3.4, Creat 1.11 and Mg 1.8 this AM.

## 2020-02-08 DIAGNOSIS — M069 Rheumatoid arthritis, unspecified: Secondary | ICD-10-CM | POA: Diagnosis not present

## 2020-02-08 DIAGNOSIS — I483 Typical atrial flutter: Secondary | ICD-10-CM | POA: Diagnosis not present

## 2020-02-08 DIAGNOSIS — I5023 Acute on chronic systolic (congestive) heart failure: Secondary | ICD-10-CM | POA: Diagnosis not present

## 2020-02-08 DIAGNOSIS — M79671 Pain in right foot: Secondary | ICD-10-CM | POA: Diagnosis not present

## 2020-02-08 LAB — CBC
HCT: 38.5 % — ABNORMAL LOW (ref 39.0–52.0)
Hemoglobin: 11.8 g/dL — ABNORMAL LOW (ref 13.0–17.0)
MCH: 24.9 pg — ABNORMAL LOW (ref 26.0–34.0)
MCHC: 30.6 g/dL (ref 30.0–36.0)
MCV: 81.4 fL (ref 80.0–100.0)
Platelets: 278 10*3/uL (ref 150–400)
RBC: 4.73 MIL/uL (ref 4.22–5.81)
RDW: 15.6 % — ABNORMAL HIGH (ref 11.5–15.5)
WBC: 6.3 10*3/uL (ref 4.0–10.5)
nRBC: 0 % (ref 0.0–0.2)

## 2020-02-08 LAB — APTT: aPTT: 50 seconds — ABNORMAL HIGH (ref 24–36)

## 2020-02-08 LAB — MAGNESIUM: Magnesium: 2 mg/dL (ref 1.7–2.4)

## 2020-02-08 MED ORDER — SODIUM CHLORIDE 0.9% FLUSH
3.0000 mL | Freq: Two times a day (BID) | INTRAVENOUS | Status: DC
  Administered 2020-02-09 – 2020-02-11 (×4): 3 mL via INTRAVENOUS

## 2020-02-08 MED ORDER — SODIUM CHLORIDE 0.9 % IV SOLN: 250.0000 mL | INTRAVENOUS | Status: DC | PRN

## 2020-02-08 MED ORDER — SODIUM CHLORIDE 0.9 % IV SOLN: INTRAVENOUS | Status: DC

## 2020-02-08 MED ORDER — SODIUM CHLORIDE 0.9% FLUSH: 3.0000 mL | INTRAVENOUS | Status: DC | PRN

## 2020-02-08 MED ORDER — HEPARIN (PORCINE) 25000 UT/250ML-% IV SOLN
1450.0000 [IU]/h | INTRAVENOUS | Status: DC
  Administered 2020-02-08: 1050 [IU]/h via INTRAVENOUS
  Administered 2020-02-09: 1300 [IU]/h via INTRAVENOUS
  Filled 2020-02-08 (×2): qty 250

## 2020-02-08 NOTE — Progress Notes (Signed)
Progress Note  Patient Name: Thomas [name] Date of Encounter: 02/08/2020  [facility] Cardiologist: Freada Bergeron, MD   Subjective  Continues to feel better. LE improving, breathing improving. Wants to have cath tomorrow if possible.   Net negative 1.38 L on lasix gtt Cr pending this morning Transitioned from apixaban to heparin given plans for possible cath tomorrow Remains in [place] with HR 110s Inpatient Medications    Scheduled Meds: . acetaminophen  1,000 mg Oral TID  . atorvastatin  40 mg Oral Daily  . collagenase   Topical Daily  . diclofenac Sodium  2 g Topical BID  . DULoxetine  60 mg Oral Daily  . metoprolol tartrate  25 mg Oral TID  . oxyCODONE  10 mg Oral Q4H while awake  . polyethylene glycol  17 g Oral BID  . senna  1 tablet Oral Daily  . sodium chloride flush  3 mL Intravenous Q12H   Continuous Infusions: . sodium chloride    . furosemide (LASIX) infusion 15 mg/hr (02/08/20 0514)  . heparin 1,050 Units/hr (02/08/20 1038)  . ondansetron (ZOFRAN) IV     PRN Meds: sodium chloride, ondansetron (ZOFRAN) IV, sodium chloride flush   Vital Signs    Vitals:   02/08/20 0951 02/08/20 0953 02/08/20 1031 02/08/20 1034  BP: 96/70   103/76  Pulse: (!) 57 (!) 115 (!) 116 (!) 113  Resp:    20  Temp:    (!) 97.3 F (36.3 C)  TempSrc:    Oral  SpO2: 98%  94% 98%  Weight:      Height:        Intake/Output Summary (Last 24 hours) at 02/08/2020 1224 Last data filed at 02/08/2020 0900 Gross per 24 hour  Intake --  Output 1475 ml  Net -1475 ml   Last 3 Weights 02/08/2020 02/07/2020 02/06/2020  Weight (lbs) 197 lb 9.6 oz 200 lb 9.9 oz 206 lb 2.1 oz  Weight (kg) 89.631 kg 91 kg 93.5 kg      Telemetry    Aflutter with HR 110s - Personally Reviewed  ECG    No new ECGs- Personally Reviewed  Physical Exam   GEN: Comfortable, speaking in full sentences without SOB   Neck: JVD midneck Cardiac: Tachycardic, regular, no murmurs.  Respiratory:  CTAB GI: Soft, nontender, non-distended  MS: 3+ pitting edema to the thighs but improved from prior. Warm Neuro:  Nonfocal  Psych: Normal affect   Labs    High Sensitivity Troponin:   Recent Labs  Lab 02/04/20 0948 02/04/20 1112  TROPONINIHS 18* 23*      Chemistry Recent Labs  Lab 02/05/20 0255 02/06/20 0222 02/07/20 0143  NA 138 142 138  K 3.6 4.0 3.4*  CL 96* 97* 92*  CO2 31 33* 35*  GLUCOSE 156* 119* 105*  BUN 25* 28* 26*  CREATININE 1.12 1.27* 1.11  CALCIUM 8.6* 8.9 9.0  GFRNONAA >60 58* >60  ANIONGAP 11 12 11      Hematology Recent Labs  Lab 02/06/20 0222 02/07/20 0143 02/08/20 0158  WBC 7.8 6.6 6.3  RBC 4.91 4.79 4.73  HGB 12.7* 11.9* 11.8*  HCT 41.2 39.7 38.5*  MCV 83.9 82.9 81.4  MCH 25.9* 24.8* 24.9*  MCHC 30.8 30.0 30.6  RDW 15.9* 15.7* 15.6*  PLT 340 311 278    BNPNo results for input(s): BNP, PROBNP in the last 168 hours.   DDimer No results for input(s): DDIMER in the last 168 hours.   Radiology  No results found.  Cardiac Studies   Echo personally reviewed EF 15% 02/02/20 1. While there is no visualized LV thrombus on sweeps of the apex,  patient did not have IV access, unable to use echo contrast for  confirmation. Left ventricular ejection fraction, by estimation, is <20%.  The left ventricle has severely decreased  function. The left ventricle demonstrates global hypokinesis. The left  ventricular internal cavity size was moderately dilated. Left ventricular  diastolic function could not be evaluated.  2. Right ventricular systolic function is normal. The right ventricular  size is mildly enlarged. There is mildly elevated pulmonary artery  systolic pressure.  3. Left atrial size was severely dilated.  4. Right atrial size was severely dilated.  5. Moderate pleural effusion in both left and right lateral regions.  6. The mitral valve is normal in structure. Mild mitral valve  regurgitation.  7. The aortic valve is  tricuspid. There is mild calcification of the  aortic valve. Aortic valve regurgitation is not visualized. Mild aortic  valve sclerosis is present, with no evidence of aortic valve stenosis.  8. The inferior vena cava is dilated in size with <50% respiratory  variability, suggesting right atrial pressure of 15 mmHg.   Patient Profile     69 y.o. 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  Assessment & Plan    #Aflutter: Remains in flutter with HR 110-120s. CHADs-vasc 3. -Continue heparin gtt for plans for possible R and LHC on 02/09/20 -Will make NPO at [place] in case goes for cath; patient really would like it to happen tomorrow if possible even though not euvolemic yet. Has financial difficulties at home and anxious to return to work. -Continue low dose metoprolol for rate control -Plan for TEE/DCCV once more euvolemic  #Acute systolic heart failure: TTE with LVEF <20% in the setting of Aflutter with RVR.  -Continue aggressive diuresis with lasix gtt -Continue low dose metop -Once more stable, will need HF medication optimization (entresto, spironolactone)  #Chronic Pain: -IM following, appreciate recommendations  For questions or updates, please contact [facility] Please consult [email or web address] for contact info under        Signed, Freada Bergeron, MD  02/08/2020, 12:24 PM

## 2020-02-08 NOTE — Progress Notes (Signed)
ANTICOAGULATION CONSULT NOTE - Initial Consult  Pharmacy Consult for heparin Indication: atrial fibrillation  Allergies  Allergen Reactions  . Chlorthalidone Other (See Comments)    "Makes me light-headed and I don't like the way it makes me feel"    Patient Measurements: Height: 6\' 8"  (203.2 cm) Weight: 89.6 kg (197 lb 9.6 oz) IBW/kg (Calculated) : 96 Heparin Dosing Weight: 89kg  Vital Signs: Temp: 98.3 F (36.8 C) (10/17 0418) Temp Source: Oral (10/17 0418) BP: 94/77 (10/17 0418) Pulse Rate: 99 (10/17 0418)  Labs: Recent Labs    02/06/20 0222 02/06/20 0222 02/07/20 0143 02/08/20 0158  HGB 12.7*   < > 11.9* 11.8*  HCT 41.2  --  39.7 38.5*  PLT 340  --  311 278  CREATININE 1.27*  --  1.11  --    < > = values in this interval not displayed.    Estimated Creatinine Clearance: 81.8 mL/min (by C-G formula based on SCr of 1.11 mg/dL).   Medical History: Past Medical History:  Diagnosis Date  . Atrial flutter ([place]) 01/2020  . Cancer [facility])    prostate  . Claustrophobia    quite severe  . Heart failure with reduced ejection fraction ([place])   . Hypertension   . Hypoglycemia    occ  . Left knee DJD    Xray 12/23/08  . Prostate cancer [facility])      Assessment: 51 yoM admitted with AFL and acute HF started on apixaban for anticoagulation. Now to transition to IV heparin with plans for ischemic evaluation early next week. Last dose of apixaban was 10/16 ~10p. CBC stable, will follow aPTTs until correlating due to DOAC use.  Goal of Therapy:  Heparin level 0.3-0.7 units/ml aPTT 66-102 seconds Monitor platelets by anticoagulation protocol: Yes   Plan:  -Heparin 1050 units/h no bolus starting at 1000 -Check 6h aPTT -Daily heparin level and aPTT until correlating   Arrie Senate, PharmD, BCPS Clinical Pharmacist [phone] Please check AMION for all [place] numbers 02/08/2020

## 2020-02-08 NOTE — Plan of Care (Signed)
  Problem: Clinical Measurements: Goal: Ability to maintain clinical measurements within normal limits will improve Outcome: Progressing Goal: Cardiovascular complication will be avoided Outcome: Progressing   

## 2020-02-08 NOTE — Progress Notes (Signed)
Family Medicine Teaching Service Daily Progress Note Intern Pager: [phone]  Patient name: Thomas [name] Medical record number: [id number] Date of birth: 03-27-1951 Age: 69 y.o. Gender: male  Primary Care Provider: [name], MD Consultants: Cardiology, Podiatry Code Status: Full  Pt Overview and Major Events to Date:  10/11 Admitted  Assessment and Plan:  Thomas [name] is a69 yr oldmale who presentswith SOBdue to acute systolic HF exacerbation and a flutter, PMHxsignificant for rheumatoid arthritis, atrial flutter, newly diagnosed acute systolicHR with reduced EF, and recentGreat toe amputation and left second metatarsal osteotomy   Polyarthropathy Chronic Pain  Uncontrolled Right Foot Pain: Chronic pain in multiple joints due to osteoarthritisand recentsurgery. Home medication is Percocet 10-325 q4 PRN. Previously able to do ADL's but recently unable to.  Podiatry saw patient yesterday and recommend cleaning of his surgical site with Betadine but will wait until outpatient follow up for further interventions. -ContinueOxycodone 10 mg q4h while awake (home dose) -Scheduled Tylenol1000 mg TID -Continue Cymbalta 60 mg daily   A flutter RVR: CHA2DS2-VASc score is 3 (age, HTN, CHF) Mag is 2.0. Cardiology is managing -Continue Metoprolol 25 mg TID -Continue Eliquis 5 mg BID -Per cards (10/12)Keep K>4 and Mag >2   Acute systolic CHF/ Cardiomyopathy with EF<20% Cardiology is managing -Continue IV Lasix infusion 10 mg/hr -Continue Losartan 12.5 mg daily   FEN/GI: 2 g Salt restricted1500 cc fluid restricted/ Miralax and Senokot PPx: Eliquis  Disposition: Progressive  Prior to Admission Living Arrangement: Home Anticipated Discharge Location: Home Barriers to Discharge: Cardiac Management Anticipated discharge in approximately 0-2 day(s).   Subjective:  Interviewed patient at bedside.  He reports his pain is continuing to improve.  Discussed that can expect continued pain improvement from the Cymbalta for up to a month after starting it. He has no other complaints at this time.    Objective: Temp:  [97.4 F (36.3 C)-98.4 F (36.9 C)] 98.3 F (36.8 C) (10/17 0418) Pulse Rate:  [84-117] 99 (10/17 0418) Resp:  [18-20] 20 (10/17 0418) BP: (91-122)/(63-80) 94/77 (10/17 0418) SpO2:  [93 %-99 %] 93 % (10/17 0418) Weight:  [197 lb 9.6 oz (89.6 kg)] 197 lb 9.6 oz (89.6 kg) (10/17 0418) Physical Exam:  Physical Exam Vitals and nursing note reviewed.  HENT:     Head: Normocephalic and atraumatic.  Cardiovascular:     Rate and Rhythm: Regular rhythm. Tachycardia present.     Pulses: Normal pulses.          Radial pulses are 2+ on the right side and 2+ on the left side.       Dorsalis pedis pulses are 2+ on the right side and 2+ on the left side.     Heart sounds: Normal heart sounds, S1 normal and S2 normal. No murmur heard.   Pulmonary:     Effort: Pulmonary effort is normal. No respiratory distress.     Breath sounds: Normal breath sounds. No wheezing.  Abdominal:     General: There is no distension.     Tenderness: There is no abdominal tenderness.  Musculoskeletal:     Right lower leg: 1+ Pitting Edema present.     Left lower leg: 1+ Pitting Edema present.     Comments: Edema extends up to knees bilaterally  Neurological:     Mental Status: Mental status is at baseline.  Psychiatric:        Mood and Affect: Mood normal.        Behavior: Behavior normal.  Thought Content: Thought content normal.      Laboratory: Recent Labs  Lab 02/06/20 0222 02/07/20 0143 02/08/20 0158  WBC 7.8 6.6 6.3  HGB 12.7* 11.9* 11.8*  HCT 41.2 39.7 38.5*  PLT 340 311 278   Recent Labs  Lab 02/05/20 0255 02/06/20 0222 02/07/20 0143  NA 138 142 138  K 3.6 4.0 3.4*  CL 96* 97* 92*  CO2 31 33* 35*  BUN 25* 28* 26*  CREATININE 1.12 1.27* 1.11  CALCIUM 8.6* 8.9 9.0  GLUCOSE 156* 119* 105*    Mag:  2.0  Imaging/Diagnostic Tests:   DG Toe Great Right:Prior 1st toe amputation with soft tissue swelling at the site. Underlying 1st metatarsal head appears stable since September. No strong evidence of osteomyelitis. Calcified peripheral vascular disease.   Thomas Cedar, MD 02/08/2020, 6:24 AM PGY-1, [place] Intern pager: [phone], text pages welcome

## 2020-02-08 NOTE — Progress Notes (Signed)
[place] for heparin Indication: atrial fibrillation  Allergies  Allergen Reactions  . Chlorthalidone Other (See Comments)    "Makes me light-headed and I don't like the way it makes me feel"    Patient Measurements: Height: 6\' 8"  (203.2 cm) Weight: 89.6 kg (197 lb 9.6 oz) IBW/kg (Calculated) : 96 Heparin Dosing Weight: 89kg  Vital Signs: Temp: 97.3 F (36.3 C) (10/17 1034) Temp Source: Oral (10/17 1034) BP: 103/76 (10/17 1034) Pulse Rate: 113 (10/17 1034)  Labs: Recent Labs    02/06/20 0222 02/06/20 0222 02/07/20 0143 02/08/20 0158 02/08/20 1520  HGB 12.7*   < > 11.9* 11.8*  --   HCT 41.2  --  39.7 38.5*  --   PLT 340  --  311 278  --   APTT  --   --   --   --  50*  CREATININE 1.27*  --  1.11  --   --    < > = values in this interval not displayed.    Estimated Creatinine Clearance: 81.8 mL/min (by C-G formula based on SCr of 1.11 mg/dL).   Medical History: Past Medical History:  Diagnosis Date  . Atrial flutter ([place]) 01/2020  . Cancer [facility])    prostate  . Claustrophobia    quite severe  . Heart failure with reduced ejection fraction ([place])   . Hypertension   . Hypoglycemia    occ  . Left knee DJD    Xray 12/23/08  . Prostate cancer [facility])      Assessment: 54 yoM admitted with AFL and acute HF started on apixaban for anticoagulation. Now to transition to IV heparin with plans for ischemic evaluation early next week. Last dose of apixaban was 10/16 ~10p. CBC stable, will follow aPTTs until correlating due to DOAC use.  Initial aPTT subtherapeutic at 50 seconds on 1050 units/hr  Goal of Therapy:  Heparin level 0.3-0.7 units/ml aPTT 66-102 seconds Monitor platelets by anticoagulation protocol: Yes   Plan:  Increase heparin gtt to 1150 units/hr F/u 6 hour aPTT   Bertis Ruddy, PharmD Clinical Pharmacist ED Pharmacist Phone # [phone] 02/08/2020 5:13 PM

## 2020-02-09 ENCOUNTER — Encounter (HOSPITAL_COMMUNITY)
Admission: AD | Disposition: A | Discharge status: Home / Self Care | Payer: Self-pay | Source: Ambulatory Visit | Attending: Cardiology

## 2020-02-09 DIAGNOSIS — M79671 Pain in right foot: Secondary | ICD-10-CM | POA: Diagnosis not present

## 2020-02-09 DIAGNOSIS — I5021 Acute systolic (congestive) heart failure: Secondary | ICD-10-CM | POA: Diagnosis not present

## 2020-02-09 DIAGNOSIS — I483 Typical atrial flutter: Secondary | ICD-10-CM | POA: Diagnosis not present

## 2020-02-09 DIAGNOSIS — M069 Rheumatoid arthritis, unspecified: Secondary | ICD-10-CM | POA: Diagnosis not present

## 2020-02-09 DIAGNOSIS — I5023 Acute on chronic systolic (congestive) heart failure: Secondary | ICD-10-CM | POA: Diagnosis not present

## 2020-02-09 HISTORY — PX: RIGHT/LEFT HEART CATH AND CORONARY ANGIOGRAPHY: CATH118266

## 2020-02-09 LAB — POCT I-STAT EG7
Acid-Base Excess: 12 mmol/L — ABNORMAL HIGH (ref 0.0–2.0)
Bicarbonate: 38.3 mmol/L — ABNORMAL HIGH (ref 20.0–28.0)
Calcium, Ion: 1.11 mmol/L — ABNORMAL LOW (ref 1.15–1.40)
HCT: 40 % (ref 39.0–52.0)
Hemoglobin: 13.6 g/dL (ref 13.0–17.0)
O2 Saturation: 58 %
Potassium: 2.7 mmol/L — CL (ref 3.5–5.1)
Sodium: 138 mmol/L (ref 135–145)
TCO2: 40 mmol/L — ABNORMAL HIGH (ref 22–32)
pCO2, Ven: 53 mmHg (ref 44.0–60.0)
pH, Ven: 7.468 — ABNORMAL HIGH (ref 7.250–7.430)
pO2, Ven: 29 mmHg — CL (ref 32.0–45.0)

## 2020-02-09 LAB — MAGNESIUM
Magnesium: 1.8 mg/dL (ref 1.7–2.4)
Magnesium: 1.7 mg/dL (ref 1.7–2.4)

## 2020-02-09 LAB — APTT
aPTT: 50 seconds — ABNORMAL HIGH (ref 24–36)
aPTT: 54 seconds — ABNORMAL HIGH (ref 24–36)

## 2020-02-09 LAB — CBC
HCT: 40.9 % (ref 39.0–52.0)
Hemoglobin: 12.7 g/dL — ABNORMAL LOW (ref 13.0–17.0)
MCH: 25.5 pg — ABNORMAL LOW (ref 26.0–34.0)
MCHC: 31.1 g/dL (ref 30.0–36.0)
MCV: 82 fL (ref 80.0–100.0)
Platelets: 306 10*3/uL (ref 150–400)
RBC: 4.99 MIL/uL (ref 4.22–5.81)
RDW: 15.9 % — ABNORMAL HIGH (ref 11.5–15.5)
WBC: 6.5 10*3/uL (ref 4.0–10.5)
nRBC: 0 % (ref 0.0–0.2)

## 2020-02-09 LAB — POCT I-STAT 7, (LYTES, BLD GAS, ICA,H+H)
Acid-Base Excess: 12 mmol/L — ABNORMAL HIGH (ref 0.0–2.0)
Bicarbonate: 37.2 mmol/L — ABNORMAL HIGH (ref 20.0–28.0)
Calcium, Ion: 1.14 mmol/L — ABNORMAL LOW (ref 1.15–1.40)
HCT: 41 % (ref 39.0–52.0)
Hemoglobin: 13.9 g/dL (ref 13.0–17.0)
O2 Saturation: 93 %
Potassium: 2.6 mmol/L — CL (ref 3.5–5.1)
Sodium: 138 mmol/L (ref 135–145)
TCO2: 39 mmol/L — ABNORMAL HIGH (ref 22–32)
pCO2 arterial: 51.3 mmHg — ABNORMAL HIGH (ref 32.0–48.0)
pH, Arterial: 7.468 — ABNORMAL HIGH (ref 7.350–7.450)
pO2, Arterial: 64 mmHg — ABNORMAL LOW (ref 83.0–108.0)

## 2020-02-09 LAB — BASIC METABOLIC PANEL
Anion gap: 12 (ref 5–15)
Anion gap: 14 (ref 5–15)
BUN: 20 mg/dL (ref 8–23)
BUN: 17 mg/dL (ref 8–23)
CO2: 33 mmol/L — ABNORMAL HIGH (ref 22–32)
CO2: 33 mmol/L — ABNORMAL HIGH (ref 22–32)
Calcium: 8.8 mg/dL — ABNORMAL LOW (ref 8.9–10.3)
Calcium: 8.6 mg/dL — ABNORMAL LOW (ref 8.9–10.3)
Chloride: 93 mmol/L — ABNORMAL LOW (ref 98–111)
Chloride: 90 mmol/L — ABNORMAL LOW (ref 98–111)
Creatinine, Ser: 0.99 mg/dL (ref 0.61–1.24)
Creatinine, Ser: 1.05 mg/dL (ref 0.61–1.24)
GFR, Estimated: >60 mL/min (ref >60)
GFR, Estimated: >60 mL/min (ref >60)
Glucose, Bld: 110 mg/dL — ABNORMAL HIGH (ref 70–99)
Glucose, Bld: 97 mg/dL (ref 70–99)
Potassium: 3.4 mmol/L — ABNORMAL LOW (ref 3.5–5.1)
Potassium: 2.9 mmol/L — ABNORMAL LOW (ref 3.5–5.1)
Sodium: 138 mmol/L (ref 135–145)
Sodium: 137 mmol/L (ref 135–145)

## 2020-02-09 LAB — HEPARIN LEVEL (UNFRACTIONATED): Heparin Unfractionated: 1.68 IU/mL — ABNORMAL HIGH (ref 0.30–0.70)

## 2020-02-09 SURGERY — RIGHT/LEFT HEART CATH AND CORONARY ANGIOGRAPHY
Anesthesia: LOCAL

## 2020-02-09 MED ORDER — FENTANYL CITRATE (PF) 100 MCG/2ML IJ SOLN
INTRAMUSCULAR | Status: AC
  Filled 2020-02-09: qty 2

## 2020-02-09 MED ORDER — POTASSIUM CHLORIDE CRYS ER 20 MEQ PO TBCR
40.0000 meq | EXTENDED_RELEASE_TABLET | Freq: Once | ORAL | Status: AC
  Administered 2020-02-09: 40 meq via ORAL
  Filled 2020-02-09: qty 2

## 2020-02-09 MED ORDER — VERAPAMIL HCL 2.5 MG/ML IV SOLN
INTRAVENOUS | Status: DC | PRN
  Administered 2020-02-09: 10 mL via INTRA_ARTERIAL

## 2020-02-09 MED ORDER — VERAPAMIL HCL 2.5 MG/ML IV SOLN
INTRAVENOUS | Status: AC
  Filled 2020-02-09: qty 2

## 2020-02-09 MED ORDER — HEPARIN (PORCINE) IN NACL 1000-0.9 UT/500ML-% IV SOLN
INTRAVENOUS | Status: AC
  Filled 2020-02-09: qty 1000

## 2020-02-09 MED ORDER — HEPARIN SODIUM (PORCINE) 1000 UNIT/ML IJ SOLN
INTRAMUSCULAR | Status: AC
  Filled 2020-02-09: qty 1

## 2020-02-09 MED ORDER — FENTANYL CITRATE (PF) 100 MCG/2ML IJ SOLN
INTRAMUSCULAR | Status: DC | PRN
Start: 2020-02-09 — End: 2020-02-09
  Administered 2020-02-09: 12.5 ug via INTRAVENOUS

## 2020-02-09 MED ORDER — MIDAZOLAM HCL 2 MG/2ML IJ SOLN
INTRAMUSCULAR | Status: AC
  Filled 2020-02-09: qty 2

## 2020-02-09 MED ORDER — LIDOCAINE HCL (PF) 1 % IJ SOLN
INTRAMUSCULAR | Status: AC
  Filled 2020-02-09: qty 30

## 2020-02-09 MED ORDER — SODIUM CHLORIDE 0.9% FLUSH: 3.0000 mL | INTRAVENOUS | Status: DC | PRN

## 2020-02-09 MED ORDER — SODIUM CHLORIDE 0.9% FLUSH
3.0000 mL | Freq: Two times a day (BID) | INTRAVENOUS | Status: DC
  Administered 2020-02-09 – 2020-02-11 (×4): 3 mL via INTRAVENOUS

## 2020-02-09 MED ORDER — IOHEXOL 350 MG/ML SOLN
INTRAVENOUS | Status: DC | PRN
  Administered 2020-02-09: 50 mL

## 2020-02-09 MED ORDER — LIDOCAINE HCL (PF) 1 % IJ SOLN
INTRAMUSCULAR | Status: DC | PRN
  Administered 2020-02-09 (×2): 2 mL

## 2020-02-09 MED ORDER — ASPIRIN 81 MG PO CHEW
81.0000 mg | CHEWABLE_TABLET | Freq: Once | ORAL | Status: AC
  Administered 2020-02-09: 81 mg via ORAL
  Filled 2020-02-09: qty 1

## 2020-02-09 MED ORDER — HEPARIN SODIUM (PORCINE) 1000 UNIT/ML IJ SOLN
INTRAMUSCULAR | Status: DC | PRN
  Administered 2020-02-09: 4500 [IU] via INTRAVENOUS

## 2020-02-09 MED ORDER — HYDRALAZINE HCL 20 MG/ML IJ SOLN: 10.0000 mg | INTRAMUSCULAR | Status: AC | PRN

## 2020-02-09 MED ORDER — MIDAZOLAM HCL 2 MG/2ML IJ SOLN
INTRAMUSCULAR | Status: DC | PRN
  Administered 2020-02-09: 0.5 mg via INTRAVENOUS

## 2020-02-09 MED ORDER — HEPARIN (PORCINE) IN NACL 1000-0.9 UT/500ML-% IV SOLN
INTRAVENOUS | Status: DC | PRN
  Administered 2020-02-09 (×2): 500 mL

## 2020-02-09 MED ORDER — SODIUM CHLORIDE 0.9 % IV SOLN: 250.0000 mL | INTRAVENOUS | Status: DC | PRN

## 2020-02-09 MED ORDER — SODIUM CHLORIDE 0.9 % IV SOLN: INTRAVENOUS | Status: DC

## 2020-02-09 SURGICAL SUPPLY — 14 items
CATH 5FR JL3.5 JR4 ANG PIG MP (CATHETERS) ×1 IMPLANT
CATH SWAN DBL LUMAN 5F 110 (CATHETERS) ×1 IMPLANT
DEVICE RAD COMP TR BAND LRG (VASCULAR PRODUCTS) ×1 IMPLANT
GLIDESHEATH SLEND SS 6F .021 (SHEATH) ×2 IMPLANT
GUIDEWIRE INQWIRE 1.5J.035X260 (WIRE) IMPLANT
INQWIRE 1.5J .035X260CM (WIRE) ×2
KIT HEART LEFT (KITS) ×2 IMPLANT
PACK CARDIAC CATHETERIZATION (CUSTOM PROCEDURE TRAY) ×2 IMPLANT
SHEATH PROBE COVER 6X72 (BAG) ×1 IMPLANT
TRANSDUCER W/STOPCOCK (MISCELLANEOUS) ×2 IMPLANT
TUBING ART PRESS 72  MALE/FEM (TUBING) ×1
TUBING ART PRESS 72 MALE/FEM (TUBING) IMPLANT
TUBING CIL FLEX 10 FLL-RA (TUBING) ×2 IMPLANT
WIRE HI TORQ VERSACORE-J 145CM (WIRE) ×1 IMPLANT

## 2020-02-09 NOTE — Progress Notes (Addendum)
Progress Note  Patient Name: Thomas [name] Date of Encounter: 02/09/2020  [facility] Cardiologist: Freada Bergeron, MD   Subjective  Patient states that he is able to lay flat and really wants to undergo cath. Very anxious to return to work. LE improving. No SOB or chest pain.  Remains in [place] with HR 110s.  Net negative 1.1L with 2.55L UOP Cr 0.99 Inpatient Medications    Scheduled Meds: . acetaminophen  1,000 mg Oral TID  . atorvastatin  40 mg Oral Daily  . collagenase   Topical Daily  . diclofenac Sodium  2 g Topical BID  . DULoxetine  60 mg Oral Daily  . metoprolol tartrate  25 mg Oral TID  . oxyCODONE  10 mg Oral Q4H while awake  . polyethylene glycol  17 g Oral BID  . senna  1 tablet Oral Daily  . sodium chloride flush  3 mL Intravenous Q12H  . sodium chloride flush  3 mL Intravenous Q12H   Continuous Infusions: . sodium chloride    . sodium chloride    . sodium chloride 10 mL/hr at 02/09/20 0542  . furosemide (LASIX) infusion 15 mg/hr (02/08/20 2309)  . heparin 1,300 Units/hr (02/09/20 0537)  . ondansetron (ZOFRAN) IV     PRN Meds: sodium chloride, sodium chloride, ondansetron (ZOFRAN) IV, sodium chloride flush, sodium chloride flush   Vital Signs    Vitals:   02/08/20 2248 02/08/20 2308 02/09/20 0040 02/09/20 0440  BP: 92/60  104/74 118/77  Pulse:   (!) 103 98  Resp:   17 19  Temp:   (!) 97.5 F (36.4 C) 97.6 F (36.4 C)  TempSrc:   Oral Oral  SpO2:  100% 99% 96%  Weight:    87.8 kg  Height:        Intake/Output Summary (Last 24 hours) at 02/09/2020 0745 Last data filed at 02/09/2020 0537 Gross per 24 hour  Intake 1477.21 ml  Output 2550 ml  Net -1072.79 ml   Last 3 Weights 02/09/2020 02/08/2020 02/07/2020  Weight (lbs) 193 lb 9 oz 197 lb 9.6 oz 200 lb 9.9 oz  Weight (kg) 87.8 kg 89.631 kg 91 kg      Telemetry    Aflutter with rates 110-120s - Personally Reviewed  ECG    No new one for this morning- Personally  Reviewed  Physical Exam   GEN: NAD Neck: JVD to base of neck at 90 degrees Cardiac: Tachycardic, regular, no murmurs.  Respiratory: CTAB GI: Soft, nontender, non-distended  MS: 2+ pitting edema to the knees (significantly improved) Neuro:  Nonfocal  Psych: Normal affect   Labs    High Sensitivity Troponin:   Recent Labs  Lab 02/04/20 0948 02/04/20 1112  TROPONINIHS 18* 23*      Chemistry Recent Labs  Lab 02/06/20 0222 02/07/20 0143 02/09/20 0104  NA 142 138 138  K 4.0 3.4* 3.4*  CL 97* 92* 93*  CO2 33* 35* 33*  GLUCOSE 119* 105* 110*  BUN 28* 26* 20  CREATININE 1.27* 1.11 0.99  CALCIUM 8.9 9.0 8.8*  GFRNONAA 58* >60 >60  ANIONGAP 12 11 12      Hematology Recent Labs  Lab 02/07/20 0143 02/08/20 0158 02/09/20 0104  WBC 6.6 6.3 6.5  RBC 4.79 4.73 4.99  HGB 11.9* 11.8* 12.7*  HCT 39.7 38.5* 40.9  MCV 82.9 81.4 82.0  MCH 24.8* 24.9* 25.5*  MCHC 30.0 30.6 31.1  RDW 15.7* 15.6* 15.9*  PLT 311 278 306  BNPNo results for input(s): BNP, PROBNP in the last 168 hours.   DDimer No results for input(s): DDIMER in the last 168 hours.   Radiology    No results found.  Cardiac Studies   Echo personally reviewed EF 15% 02/02/20 1. While there is no visualized LV thrombus on sweeps of the apex,  patient did not have IV access, unable to use echo contrast for  confirmation. Left ventricular ejection fraction, by estimation, is <20%.  The left ventricle has severely decreased  function. The left ventricle demonstrates global hypokinesis. The left  ventricular internal cavity size was moderately dilated. Left ventricular  diastolic function could not be evaluated.  2. Right ventricular systolic function is normal. The right ventricular  size is mildly enlarged. There is mildly elevated pulmonary artery  systolic pressure.  3. Left atrial size was severely dilated.  4. Right atrial size was severely dilated.  5. Moderate pleural effusion in both left and  right lateral regions.  6. The mitral valve is normal in structure. Mild mitral valve  regurgitation.  7. The aortic valve is tricuspid. There is mild calcification of the  aortic valve. Aortic valve regurgitation is not visualized. Mild aortic  valve sclerosis is present, with no evidence of aortic valve stenosis.  8. The inferior vena cava is dilated in size with <50% respiratory  variability, suggesting right atrial pressure of 15 mmHg.   Patient Profile     69 y.o. 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  Assessment & Plan    #Aflutter: Remains in flutter with HR 110-120s. CHADs-vasc 3. -Continue heparin gtt for rate control -Follow-up RHC/LHC  -Continue low dose metoprolol for rate control -Plan for TEE/DCCV once euvolemic  #Acute systolic heart failure: TTE with LVEF <20% in the setting of Aflutter with RVR.  -Plan for LHC/RHC today; patient NPO -Continue aggressive diuresis with lasix gtt; will follow-up RHC numbers -Continue low dose metop -Once more stable, will need HF medication optimization (entresto, spironolactone)  #Chronic Pain: -IM following, appreciate recommendations  INFORMED CONSENT: I have reviewed the risks, indications, and alternatives to cardiac catheterization, possible angioplasty, and stenting with the patient. Risks include but are not limited to bleeding, infection, vascular injury, stroke, myocardial infection, arrhythmia, kidney injury, radiation-related injury in the case of prolonged fluoroscopy use, emergency cardiac surgery, and death. The patient understands the risks of serious complication is 1-2 in 7035 with diagnostic cardiac cath and 1-2% or less with angioplasty/stenting.   Total time of encounter: 25 minutes total time of encounter, including >50% of time spent in face-to-face patient care on the date of this encounter. This time includes  coordination of care and counseling regarding above mentioned problem list. Remainder of non-face-to-face time involved reviewing chart documents/testing relevant to the patient encounter and documentation in the medical record. I have independently reviewed documentation from referring provider.   Gwyndolyn Kaufman, MD [place]   For questions or updates, please contact [place] Please consult [email or web address] for contact info under        Signed, Freada Bergeron, MD  02/09/2020, 7:45 AM

## 2020-02-09 NOTE — Progress Notes (Signed)
ANTICOAGULATION CONSULT NOTE  Pharmacy Consult for heparin Indication: atrial fibrillation   Assessment: 79 yoM admitted with AFL and acute HF started on apixaban for anticoagulation. Now to transition to IV heparin with plans for ischemic evaluation today. Last dose of apixaban was 10/16 ~10p.  APTT remains subtherapeutic at 54 sec  Goal of Therapy:  Heparin level 0.3-0.7 units/ml aPTT 66-102 seconds Monitor platelets by anticoagulation protocol: Yes   Plan:  Increase heparin gtt to 1450 units/hr Will follow plans post cath  Hildred Laser, PharmD Clinical Pharmacist **Pharmacist phone directory can now be found on [email or web address] ([email or web address]).  Listed under [place].

## 2020-02-09 NOTE — H&P (View-Only) (Signed)
Progress Note  Patient Name: Thomas [name] Date of Encounter: 02/09/2020  [facility] Cardiologist: Freada Bergeron, MD   Subjective  Patient states that he is able to lay flat and really wants to undergo cath. Very anxious to return to work. LE improving. No SOB or chest pain.  Remains in [place] with HR 110s.  Net negative 1.1L with 2.55L UOP Cr 0.99 Inpatient Medications    Scheduled Meds: . acetaminophen  1,000 mg Oral TID  . atorvastatin  40 mg Oral Daily  . collagenase   Topical Daily  . diclofenac Sodium  2 g Topical BID  . DULoxetine  60 mg Oral Daily  . metoprolol tartrate  25 mg Oral TID  . oxyCODONE  10 mg Oral Q4H while awake  . polyethylene glycol  17 g Oral BID  . senna  1 tablet Oral Daily  . sodium chloride flush  3 mL Intravenous Q12H  . sodium chloride flush  3 mL Intravenous Q12H   Continuous Infusions: . sodium chloride    . sodium chloride    . sodium chloride 10 mL/hr at 02/09/20 0542  . furosemide (LASIX) infusion 15 mg/hr (02/08/20 2309)  . heparin 1,300 Units/hr (02/09/20 0537)  . ondansetron (ZOFRAN) IV     PRN Meds: sodium chloride, sodium chloride, ondansetron (ZOFRAN) IV, sodium chloride flush, sodium chloride flush   Vital Signs    Vitals:   02/08/20 2248 02/08/20 2308 02/09/20 0040 02/09/20 0440  BP: 92/60  104/74 118/77  Pulse:   (!) 103 98  Resp:   17 19  Temp:   (!) 97.5 F (36.4 C) 97.6 F (36.4 C)  TempSrc:   Oral Oral  SpO2:  100% 99% 96%  Weight:    87.8 kg  Height:        Intake/Output Summary (Last 24 hours) at 02/09/2020 0745 Last data filed at 02/09/2020 0537 Gross per 24 hour  Intake 1477.21 ml  Output 2550 ml  Net -1072.79 ml   Last 3 Weights 02/09/2020 02/08/2020 02/07/2020  Weight (lbs) 193 lb 9 oz 197 lb 9.6 oz 200 lb 9.9 oz  Weight (kg) 87.8 kg 89.631 kg 91 kg      Telemetry    Aflutter with rates 110-120s - Personally Reviewed  ECG    No new one for this morning- Personally  Reviewed  Physical Exam   GEN: NAD Neck: JVD to base of neck at 90 degrees Cardiac: Tachycardic, regular, no murmurs.  Respiratory: CTAB GI: Soft, nontender, non-distended  MS: 2+ pitting edema to the knees (significantly improved) Neuro:  Nonfocal  Psych: Normal affect   Labs    High Sensitivity Troponin:   Recent Labs  Lab 02/04/20 0948 02/04/20 1112  TROPONINIHS 18* 23*      Chemistry Recent Labs  Lab 02/06/20 0222 02/07/20 0143 02/09/20 0104  NA 142 138 138  K 4.0 3.4* 3.4*  CL 97* 92* 93*  CO2 33* 35* 33*  GLUCOSE 119* 105* 110*  BUN 28* 26* 20  CREATININE 1.27* 1.11 0.99  CALCIUM 8.9 9.0 8.8*  GFRNONAA 58* >60 >60  ANIONGAP 12 11 12      Hematology Recent Labs  Lab 02/07/20 0143 02/08/20 0158 02/09/20 0104  WBC 6.6 6.3 6.5  RBC 4.79 4.73 4.99  HGB 11.9* 11.8* 12.7*  HCT 39.7 38.5* 40.9  MCV 82.9 81.4 82.0  MCH 24.8* 24.9* 25.5*  MCHC 30.0 30.6 31.1  RDW 15.7* 15.6* 15.9*  PLT 311 278 306  BNPNo results for input(s): BNP, PROBNP in the last 168 hours.   DDimer No results for input(s): DDIMER in the last 168 hours.   Radiology    No results found.  Cardiac Studies   Echo personally reviewed EF 15% 02/02/20 1. While there is no visualized LV thrombus on sweeps of the apex,  patient did not have IV access, unable to use echo contrast for  confirmation. Left ventricular ejection fraction, by estimation, is <20%.  The left ventricle has severely decreased  function. The left ventricle demonstrates global hypokinesis. The left  ventricular internal cavity size was moderately dilated. Left ventricular  diastolic function could not be evaluated.  2. Right ventricular systolic function is normal. The right ventricular  size is mildly enlarged. There is mildly elevated pulmonary artery  systolic pressure.  3. Left atrial size was severely dilated.  4. Right atrial size was severely dilated.  5. Moderate pleural effusion in both left and  right lateral regions.  6. The mitral valve is normal in structure. Mild mitral valve  regurgitation.  7. The aortic valve is tricuspid. There is mild calcification of the  aortic valve. Aortic valve regurgitation is not visualized. Mild aortic  valve sclerosis is present, with no evidence of aortic valve stenosis.  8. The inferior vena cava is dilated in size with <50% respiratory  variability, suggesting right atrial pressure of 15 mmHg.   Patient Profile     69 y.o. 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  Assessment & Plan    #Aflutter: Remains in flutter with HR 110-120s. CHADs-vasc 3. -Continue heparin gtt for rate control -Follow-up RHC/LHC  -Continue low dose metoprolol for rate control -Plan for TEE/DCCV once euvolemic  #Acute systolic heart failure: TTE with LVEF <20% in the setting of Aflutter with RVR.  -Plan for LHC/RHC today; patient NPO -Continue aggressive diuresis with lasix gtt; will follow-up RHC numbers -Continue low dose metop -Once more stable, will need HF medication optimization (entresto, spironolactone)  #Chronic Pain: -IM following, appreciate recommendations  INFORMED CONSENT: I have reviewed the risks, indications, and alternatives to cardiac catheterization, possible angioplasty, and stenting with the patient. Risks include but are not limited to bleeding, infection, vascular injury, stroke, myocardial infection, arrhythmia, kidney injury, radiation-related injury in the case of prolonged fluoroscopy use, emergency cardiac surgery, and death. The patient understands the risks of serious complication is 1-2 in 2248 with diagnostic cardiac cath and 1-2% or less with angioplasty/stenting.   Total time of encounter: 25 minutes total time of encounter, including >50% of time spent in face-to-face patient care on the date of this encounter. This time includes  coordination of care and counseling regarding above mentioned problem list. Remainder of non-face-to-face time involved reviewing chart documents/testing relevant to the patient encounter and documentation in the medical record. I have independently reviewed documentation from referring provider.   Gwyndolyn Kaufman, MD [place]   For questions or updates, please contact [place] Please consult [email or web address] for contact info under        Signed, Freada Bergeron, MD  02/09/2020, 7:45 AM

## 2020-02-09 NOTE — Progress Notes (Signed)
CRITICAL VALUE ALERT  Critical Value:  K: 2.6  Date & Time Notied:  02/09/20, 1800  Provider Notified:Cardiologist Harrington Challenger  Orders Received/Actions taken: see new orders

## 2020-02-09 NOTE — Progress Notes (Addendum)
Report given to Sullivan Lone), RN. Patient is alert and oriented x4 sitting up on side of bed with call light in reach. Lasix and Heparin gtts infusing. Left and right heart cath scheduled for today. Plan is for TEE/DCCV later. SLP to work with patient today. Hgb 12.7, platelets 306, APTT 50, K+ 3.4, Creat 0.99, Mg 1.8 this AM.

## 2020-02-09 NOTE — Progress Notes (Signed)
ANTICOAGULATION CONSULT NOTE  Pharmacy Consult for heparin Indication: atrial fibrillation   Assessment: 53 yoM admitted with AFL and acute HF started on apixaban for anticoagulation. Last dose of apixaban was 10/16 ~10p.  Cath today showed no significant CAD, orders to resume heparin after TR band removed.   Goal of Therapy:  Heparin level 0.3-0.7 units/ml aPTT 66-102 seconds Monitor platelets by anticoagulation protocol: Yes   Plan:  Restart heparin gtt at 1450 units/hr after TR band removed Will follow plans for oral anticoagulation  Erin Hearing PharmD., BCPS Clinical Pharmacist 02/09/2020 5:50 PM

## 2020-02-09 NOTE — Progress Notes (Signed)
Family Medicine Teaching Service Daily Progress Note Intern Pager: [phone]  Patient name: Thomas [name] Medical record number: [id number] Date of birth: 19-Jan-1951 Age: 69 y.o. Gender: male  Primary Care Provider: [name], MD Consultants: Cardiology, Podiatry Code Status: Full  Pt Overview and Major Events to Date:  10/11 Admitted  Assessment and Plan:  Thomas [name] is a69 yr oldmale who presentswith SOBdue to acute systolic HF exacerbation and a flutter, PMHxsignificant for rheumatoid arthritis, atrial flutter, newly diagnosed acute systolicHR with reduced EF, and recentGreat toe amputation and left second metatarsal osteotomy   Polyarthropathy Chronic Pain  Uncontrolled Right Foot Pain: Chronic pain in multiple joints due to osteoarthritisand recentsurgery. Home medication is Percocet 10-325 q4 PRN. Previously able to do ADL's but recently unable to. Podiatry saw patient 10/14 and recommend cleaning of his surgical sitewith Betadinebut will wait until outpatient follow up for further interventions. He reports pain is improved from yesterday and able to get a few hours of sleep. -ContinueOxycodone 10 mg q4h while awake (home dose) -Scheduled Tylenol1000 mg TID -ContinueCymbalta 60 mg daily   A flutter RVR: CHA2DS2-VASc score is 3 (age, HTN, CHF) Mag is 2.0. Cardiology is managing -Continue Metoprolol 25 mg TID -Continue Eliquis 5 mg BID -Per cards (10/12)Keep K>4 and Mag >2   Acute systolic CHF/ Cardiomyopathy with EF<20% Cardiology is managing -Continue IV Lasix infusion 10 mg/hr -Continue Losartan 12.5 mg daily   FEN/GI: NPO for procedure then restart 2 g Salt restricted1500 cc fluid restricted/ Miralax and Senokot PPx: Holding for now for procedure  Disposition: Progressive  Prior to Admission Living Arrangement: Home Anticipated Discharge Location: Home Barriers to Discharge: Cardiac Management Anticipated discharge  in approximately 1-3 day(s).   Subjective:  Interviewed patient at bedside.  He reports that his pain has been better controlled. He reports being able to get a few hours of good sleep. Discussed that Cardiology has plans to do a heart cath today.  Objective: Temp:  [97.3 F (36.3 C)-97.6 F (36.4 C)] 97.6 F (36.4 C) (10/18 0440) Pulse Rate:  [57-116] 98 (10/18 0440) Resp:  [17-20] 19 (10/18 0440) BP: (92-118)/(60-78) 118/77 (10/18 0440) SpO2:  [91 %-100 %] 96 % (10/18 0440) Weight:  [193 lb 9 oz (87.8 kg)] 193 lb 9 oz (87.8 kg) (10/18 0440) Physical Exam:  Physical Exam Vitals and nursing note reviewed.  Constitutional:      General: He is not in acute distress.    Appearance: Normal appearance. He is normal weight. He is not ill-appearing, toxic-appearing or diaphoretic.  HENT:     Head: Normocephalic and atraumatic.  Cardiovascular:     Rate and Rhythm: Regular rhythm. Tachycardia present.     Pulses: Normal pulses.          Radial pulses are 2+ on the right side and 2+ on the left side.       Dorsalis pedis pulses are 2+ on the right side and 2+ on the left side.     Heart sounds: Normal heart sounds, S1 normal and S2 normal. No murmur heard.   Pulmonary:     Effort: Pulmonary effort is normal. No respiratory distress.     Breath sounds: Normal breath sounds. No wheezing.  Abdominal:     General: There is no distension.     Palpations: There is no mass.     Tenderness: There is no abdominal tenderness.  Musculoskeletal:     Right lower leg: 2+ Pitting Edema present.     Left  lower leg: 2+ Pitting Edema present.     Comments: Bilateral 2+ pitting edema extending up mid Tibia.  Neurological:     Mental Status: He is alert. Mental status is at baseline.  Psychiatric:        Mood and Affect: Mood normal.        Behavior: Behavior normal.        Thought Content: Thought content normal.      Laboratory: Recent Labs  Lab 02/07/20 0143 02/08/20 0158  02/09/20 0104  WBC 6.6 6.3 6.5  HGB 11.9* 11.8* 12.7*  HCT 39.7 38.5* 40.9  PLT 311 278 306   Recent Labs  Lab 02/06/20 0222 02/07/20 0143 02/09/20 0104  NA 142 138 138  K 4.0 3.4* 3.4*  CL 97* 92* 93*  CO2 33* 35* 33*  BUN 28* 26* 20  CREATININE 1.27* 1.11 0.99  CALCIUM 8.9 9.0 8.8*  GLUCOSE 119* 105* 110*    Mag: 1.8  Imaging/Diagnostic Tests:   DG Toe Great Right:Prior 1st toe amputation with soft tissue swelling at the site. Underlying 1st metatarsal head appears stable since September. No strong evidence of osteomyelitis. Calcified peripheral vascular disease.   Briant Cedar, MD 02/09/2020, 6:14 AM PGY-1, [place] Intern pager: [phone], text pages welcome

## 2020-02-09 NOTE — Care Management Important Message (Signed)
Important Message  Patient Details  Name: Thomas [name] MRN: [id number] Date of Birth: 08/21/1950   Medicare Important Message Given:  Yes     [name] 02/09/2020, 11:54 AM

## 2020-02-09 NOTE — Interval H&P Note (Signed)
History and Physical Interval Note:  02/09/2020 4:47 PM  Thomas [name]  has presented today for surgery, with the diagnosis of acute systolic heart failure.  The various methods of treatment have been discussed with the patient and family. After consideration of risks, benefits and other options for treatment, the patient has consented to  Procedure(s): RIGHT/LEFT HEART CATH AND CORONARY ANGIOGRAPHY (N/A) as a surgical intervention.  The patient's history has been reviewed, patient examined, no change in status, stable for surgery.  I have reviewed the patient's chart and labs.  Questions were answered to the patient's satisfaction.    Cath Lab Visit (complete for each Cath Lab visit)  Clinical Evaluation Leading to the Procedure:   ACS: No.  Non-ACS:    Anginal Classification: NYHA IV  Anti-ischemic medical therapy: Minimal Therapy (1 class of medications)  Non-Invasive Test Results: No non-invasive testing performed (LVEF < 20% - high risk)  Prior CABG: No previous CABG  Cera Rorke

## 2020-02-09 NOTE — Progress Notes (Signed)
ANTICOAGULATION CONSULT NOTE  Pharmacy Consult for heparin Indication: atrial fibrillation   Assessment: 53 yoM admitted with AFL and acute HF started on apixaban for anticoagulation. Now to transition to IV heparin with plans for ischemic evaluation early next week. Last dose of apixaban was 10/16 ~10p. CBC stable, will follow aPTTs until correlating due to DOAC use.  APTT remains subtherapeutic at 50 sec  Goal of Therapy:  Heparin level 0.3-0.7 units/ml aPTT 66-102 seconds Monitor platelets by anticoagulation protocol: Yes   Plan:  Increase heparin gtt to 1300 units/hr F/u 6 hour aPTT   Thanks for allowing pharmacy to be a part of this patient's care.  Excell Seltzer, PharmD Clinical Pharmacist 02/09/2020 2:25 AM

## 2020-02-09 NOTE — Progress Notes (Signed)
SLP Cancellation Note  Patient Details Name: Thomas [name] MRN: [id number] DOB: 06-18-1950   Cancelled treatment:       Reason Eval/Treat Not Completed: Other (comment) - Unable to complete BSE at this time, as pt is currently NPO for procedure today. Consulted with RN re: difficulty swallowing pills. Encouraged RN to provide pill in puree (pudding, yogurt, applesauce, etc) to facilitate oropharyngeal clearing. Will continue efforts.  Tawnia Schirm B. Quentin Ore, [facility], [place] Speech Language Pathologist Office: [phone]  Shonna Chock 02/09/2020, 9:08 AM

## 2020-02-09 NOTE — Progress Notes (Signed)
PT Cancellation Note  Patient Details Name: Thomas [name] MRN: [id number] DOB: 1950/09/17   Cancelled Treatment:    Reason Eval/Treat Not Completed: (P) Other (comment) (Pt seated edge of bed, reports feeling nervous for procedure today and opted to not perform PT session this am.  Will f/u per POC.)   Mikai Meints Eli Hose 02/09/2020, 11:43 AM  Erasmo Leventhal , PTA Acute Rehabilitation Services Pager [phone] Office [phone]

## 2020-02-10 ENCOUNTER — Encounter (HOSPITAL_COMMUNITY): Payer: Self-pay | Admitting: Internal Medicine

## 2020-02-10 DIAGNOSIS — M069 Rheumatoid arthritis, unspecified: Secondary | ICD-10-CM | POA: Diagnosis not present

## 2020-02-10 DIAGNOSIS — M79671 Pain in right foot: Secondary | ICD-10-CM | POA: Diagnosis not present

## 2020-02-10 DIAGNOSIS — I5021 Acute systolic (congestive) heart failure: Secondary | ICD-10-CM

## 2020-02-10 DIAGNOSIS — I483 Typical atrial flutter: Secondary | ICD-10-CM | POA: Diagnosis not present

## 2020-02-10 LAB — BASIC METABOLIC PANEL
Anion gap: 11 (ref 5–15)
BUN: 16 mg/dL (ref 8–23)
CO2: 32 mmol/L (ref 22–32)
Calcium: 8.5 mg/dL — ABNORMAL LOW (ref 8.9–10.3)
Chloride: 93 mmol/L — ABNORMAL LOW (ref 98–111)
Creatinine, Ser: 0.95 mg/dL (ref 0.61–1.24)
GFR, Estimated: >60 mL/min (ref >60)
Glucose, Bld: 148 mg/dL — ABNORMAL HIGH (ref 70–99)
Potassium: 3.1 mmol/L — ABNORMAL LOW (ref 3.5–5.1)
Sodium: 136 mmol/L (ref 135–145)

## 2020-02-10 LAB — CBC
HCT: 40.7 % (ref 39.0–52.0)
Hemoglobin: 12.6 g/dL — ABNORMAL LOW (ref 13.0–17.0)
MCH: 24.9 pg — ABNORMAL LOW (ref 26.0–34.0)
MCHC: 31 g/dL (ref 30.0–36.0)
MCV: 80.3 fL (ref 80.0–100.0)
Platelets: 265 10*3/uL (ref 150–400)
RBC: 5.07 MIL/uL (ref 4.22–5.81)
RDW: 15.7 % — ABNORMAL HIGH (ref 11.5–15.5)
WBC: 5.8 10*3/uL (ref 4.0–10.5)
nRBC: 0 % (ref 0.0–0.2)

## 2020-02-10 LAB — PROTIME-INR
INR: 1.3 — ABNORMAL HIGH (ref 0.8–1.2)
Prothrombin Time: 15.6 seconds — ABNORMAL HIGH (ref 11.4–15.2)

## 2020-02-10 LAB — APTT
aPTT: 36 seconds (ref 24–36)
aPTT: 49 seconds — ABNORMAL HIGH (ref 24–36)

## 2020-02-10 LAB — MAGNESIUM: Magnesium: 1.7 mg/dL (ref 1.7–2.4)

## 2020-02-10 LAB — HEPARIN LEVEL (UNFRACTIONATED): Heparin Unfractionated: 0.72 IU/mL — ABNORMAL HIGH (ref 0.30–0.70)

## 2020-02-10 MED ORDER — POTASSIUM CHLORIDE CRYS ER 20 MEQ PO TBCR
20.0000 meq | EXTENDED_RELEASE_TABLET | Freq: Once | ORAL | Status: AC
  Administered 2020-02-10: 20 meq via ORAL
  Filled 2020-02-10 (×2): qty 1

## 2020-02-10 MED ORDER — POTASSIUM CHLORIDE CRYS ER 20 MEQ PO TBCR
40.0000 meq | EXTENDED_RELEASE_TABLET | Freq: Once | ORAL | Status: AC
  Administered 2020-02-10: 40 meq via ORAL
  Filled 2020-02-10: qty 2

## 2020-02-10 MED ORDER — TORSEMIDE 20 MG PO TABS
40.0000 mg | ORAL_TABLET | Freq: Two times a day (BID) | ORAL | Status: DC
  Administered 2020-02-10 (×2): 40 mg via ORAL
  Filled 2020-02-10 (×2): qty 2

## 2020-02-10 MED ORDER — POTASSIUM CHLORIDE CRYS ER 20 MEQ PO TBCR: 40.0000 meq | EXTENDED_RELEASE_TABLET | Freq: Every day | ORAL | Status: DC

## 2020-02-10 MED ORDER — APIXABAN 5 MG PO TABS
5.0000 mg | ORAL_TABLET | Freq: Two times a day (BID) | ORAL | Status: DC
  Administered 2020-02-10 – 2020-02-12 (×5): 5 mg via ORAL
  Filled 2020-02-10 (×5): qty 1

## 2020-02-10 MED ORDER — HEPARIN (PORCINE) 25000 UT/250ML-% IV SOLN
1450.0000 [IU]/h | INTRAVENOUS | Status: DC
  Administered 2020-02-10: 1450 [IU]/h via INTRAVENOUS
  Filled 2020-02-10: qty 250

## 2020-02-10 NOTE — Progress Notes (Signed)
Family Medicine Teaching Service Daily Progress Note Intern Pager: [phone]  Patient name: Thomas [name] Medical record number: [id number] Date of birth: 1951-03-08 Age: 69 y.o. Gender: male  Primary Care Provider: [name], MD Consultants: Cardiology, Podiatry Code Status: Full  Pt Overview and Major Events to Date:  10/11 Admitted 10/18 Left/Right Heart Cath  Assessment and Plan:  Thomas [name] is a69 yr oldmale who presentswith SOBdue to acute systolic HF exacerbation and a flutter, PMHxsignificant for rheumatoid arthritis, atrial flutter, newly diagnosed acute systolicHR with reduced EF, and recentGreat toe amputation and left second metatarsal osteotomy   Polyarthropathy Chronic Pain  Uncontrolled Right Foot Pain: Chronic pain in multiple joints due to osteoarthritisand recentsurgery. Home medication is Percocet 10-325 q4 PRN. Previously able to do ADL's but recently unable to. Podiatry saw patient 10/14 and recommend cleaning routine and will follow up outpatient. He reports that   -ContinueOxycodone 10 mg q4h while awake (home dose) -Scheduled Tylenol1000 mg TID -ContinueCymbalta 60 mg daily   A flutter RVR: CHA2DS2-VASc score is 3 (age, HTN, CHF) Mag is 2.0. Cardiology is managing -Continue Metoprolol 25 mg TID -Continue Eliquis 5 mg BID -Per cards (10/12)Keep K>4 and Mag >2  FEN/GI: NPO for procedure/ Miralax and Senokot PPx: Heparin  Disposition: Procedure  Prior to Admission Living Arrangement: Home Anticipated Discharge Location: Home Barriers to Discharge: Cardiac Work up Anticipated discharge in approximately 2-4 day(s).   Subjective:  Interviewed patient at bedside.  He reports doing well today. Reports that his pain is controlled on current dosing. He has no other complaints at this time.   Objective: Temp:  [97.5 F (36.4 C)-98.4 F (36.9 C)] 97.5 F (36.4 C) (10/19 0430) Pulse Rate:  [90-118] 106 (10/19  0430) Resp:  [0-48] 17 (10/19 0430) BP: (82-138)/(48-101) 138/101 (10/19 0430) SpO2:  [0 %-100 %] 100 % (10/19 0430) Weight:  [189 lb 6 oz (85.9 kg)] 189 lb 6 oz (85.9 kg) (10/19 0430) Physical Exam:  Physical Exam Vitals and nursing note reviewed.  Constitutional:      General: He is not in acute distress.    Appearance: Normal appearance. He is normal weight. He is not ill-appearing, toxic-appearing or diaphoretic.  Cardiovascular:     Rate and Rhythm: Regular rhythm. Tachycardia present.     Pulses: Normal pulses.          Radial pulses are 2+ on the right side and 2+ on the left side.       Dorsalis pedis pulses are 2+ on the right side and 2+ on the left side.     Heart sounds: Normal heart sounds, S1 normal and S2 normal. No murmur heard.   Pulmonary:     Effort: Pulmonary effort is normal. No respiratory distress.     Breath sounds: Normal breath sounds. No wheezing.  Abdominal:     General: There is no distension.     Palpations: There is no mass.     Tenderness: There is no abdominal tenderness.  Musculoskeletal:     Right lower leg: 1+ Pitting Edema present.     Left lower leg: 1+ Pitting Edema present.     Comments: Bilateral pitting edema extends to mid tibia.  Neurological:     Mental Status: He is alert. Mental status is at baseline.  Psychiatric:        Mood and Affect: Mood normal.        Behavior: Behavior normal.        Thought Content: Thought content  normal.      Laboratory: Recent Labs  Lab 02/08/20 0158 02/08/20 0158 02/09/20 0104 02/09/20 0104 02/09/20 1708 02/09/20 1717 02/10/20 0005  WBC 6.3  --  6.5  --   --   --  5.8  HGB 11.8*   < > 12.7*   < > 13.6 13.9 12.6*  HCT 38.5*   < > 40.9   < > 40.0 41.0 40.7  PLT 278  --  306  --   --   --  265   < > = values in this interval not displayed.   Recent Labs  Lab 02/09/20 0104 02/09/20 1708 02/09/20 1717 02/09/20 1813 02/10/20 0005  NA 138   < > 138 137 136  K 3.4*   < > 2.6* 2.9* 3.1*   CL 93*  --   --  90* 93*  CO2 33*  --   --  33* 32  BUN 20  --   --  17 16  CREATININE 0.99  --   --  1.05 0.95  CALCIUM 8.8*  --   --  8.6* 8.5*  GLUCOSE 110*  --   --  97 148*   < > = values in this interval not displayed.    Mag: 1.7  Imaging/Diagnostic Tests:   Right/Left Heart Cath: No significant coronary artery disease.   DG Toe Great Right:Prior 1st toe amputation with soft tissue swelling at the site. Underlying 1st metatarsal head appears stable since September. No strong evidence of osteomyelitis. Calcified peripheral vascular disease.   Briant Cedar, MD 02/10/2020, 5:52 AM PGY-1, [place] Intern pager: [phone], text pages welcome

## 2020-02-10 NOTE — Progress Notes (Signed)
Received page that patient had brief increase in tachycardia in the 150s without acute symptomatology or complaints. Telemetry has been consistently atrial flutter 1teens-120s predominantly today but during the reported timeframe had slight irregularity consistent with potential atrial fib. HR is back in the 115 range as it has been earlier today and he is otherwise hemodynamically stable. He is already on anticoagulation with plan for TEE/DCCV tomorrow per chart due to persistent tachycardia. Blood pressure has been soft, limiting additional therapies so agree with keeping plan for TEE/DCCV tomorrow - BP currently 117/83. Monitor for breakthrough symptoms and continue plan as outlined.  Mitsuo Budnick PA-C

## 2020-02-10 NOTE — Progress Notes (Signed)
Pt's HR went up to 150s nonsustained, asymptomatic. PA Dunn notified. Plan for TEE/Cardioversion tomorrow 02/11/20. Will continue to monitor and assess closely.

## 2020-02-10 NOTE — Telephone Encounter (Signed)
Patient is following up. He is requesting to speak with Dr. Johney Frame specifically to discuss concerns regarding his procedure being rescheduled. Please call.

## 2020-02-10 NOTE — Telephone Encounter (Signed)
Returned call to patient who states Dr. Johney Frame has called him already. He thanked me for the call.

## 2020-02-10 NOTE — Progress Notes (Signed)
SLP Cancellation Note  Patient Details Name: Thomas [name] MRN: [id number] DOB: 1950-12-01   Cancelled treatment:        Attempted to see pt for clinical swallowing evaluation. Pt is NPO for TEE today.  Will reattempt when SLP schedule permits and pt is appropriate for PO trials.   Celedonio Savage, MA, [place] Office: [phone]  02/10/2020, 9:41 AM

## 2020-02-10 NOTE — Progress Notes (Signed)
Progress Note  Patient Name: Thomas [name] Date of Encounter: 02/10/2020  [facility] Cardiologist: Freada Bergeron, MD   Subjective  Patient states he is hoping to leave the hospital today but understands we need to keep him at least one more day. Denies palpitations or SOB. Cath went well.  Cath yesterday with no obstructive CAD and normal filling pressures. Lasix gtt stopped. BP low this morning and patient drowsy. Pain medication held and BP better on my evaluation at 110/70.  Remains in [place] with HR 110s.  K low again this morning at 3.1.  Inpatient Medications    Scheduled Meds: . acetaminophen  1,000 mg Oral TID  . atorvastatin  40 mg Oral Daily  . collagenase   Topical Daily  . diclofenac Sodium  2 g Topical BID  . DULoxetine  60 mg Oral Daily  . metoprolol tartrate  25 mg Oral TID  . oxyCODONE  10 mg Oral Q4H while awake  . polyethylene glycol  17 g Oral BID  . senna  1 tablet Oral Daily  . sodium chloride flush  3 mL Intravenous Q12H  . sodium chloride flush  3 mL Intravenous Q12H  . sodium chloride flush  3 mL Intravenous Q12H  . torsemide  40 mg Oral BID   Continuous Infusions: . sodium chloride    . sodium chloride    . sodium chloride 20 mL/hr at 02/09/20 2336  . heparin 1,450 Units/hr (02/10/20 0719)  . ondansetron (ZOFRAN) IV     PRN Meds: sodium chloride, sodium chloride, ondansetron (ZOFRAN) IV, sodium chloride flush, sodium chloride flush   Vital Signs    Vitals:   02/09/20 2333 02/10/20 0035 02/10/20 0430 02/10/20 0748  BP:  100/82 (!) 138/101 109/83  Pulse:  100 (!) 106 (!) 113  Resp:  18 17 16   Temp:  98 F (36.7 C) (!) 97.5 F (36.4 C) (!) 97.4 F (36.3 C)  TempSrc:  Oral Oral Oral  SpO2: 99% 98% 100% 92%  Weight:   85.9 kg   Height:        Intake/Output Summary (Last 24 hours) at 02/10/2020 0828 Last data filed at 02/10/2020 0719 Gross per 24 hour  Intake 701.91 ml  Output 2700 ml  Net -1998.09 ml   Last 3  Weights 02/10/2020 02/09/2020 02/08/2020  Weight (lbs) 189 lb 6 oz 193 lb 9 oz 197 lb 9.6 oz  Weight (kg) 85.9 kg 87.8 kg 89.631 kg      Telemetry    Aflutter with rates 110-120s - Personally Reviewed  ECG    Aflutter- Personally Reviewed  Physical Exam   GEN: Drowsy but easily arousable and alert Neck: No JVD Cardiac: Tachycardic, regular, no murmurs.  Respiratory: CTAB GI: Soft, nontender, non-distended  MS: 1+ pitting edema to the knees (down significantly from prior exams) Neuro:  Nonfocal  Psych: Normal affect   Labs    High Sensitivity Troponin:   Recent Labs  Lab 02/04/20 0948 02/04/20 1112  TROPONINIHS 18* 23*      Chemistry Recent Labs  Lab 02/09/20 0104 02/09/20 1708 02/09/20 1717 02/09/20 1813 02/10/20 0005  NA 138   < > 138 137 136  K 3.4*   < > 2.6* 2.9* 3.1*  CL 93*  --   --  90* 93*  CO2 33*  --   --  33* 32  GLUCOSE 110*  --   --  97 148*  BUN 20  --   --  17 16  CREATININE 0.99  --   --  1.05 0.95  CALCIUM 8.8*  --   --  8.6* 8.5*  GFRNONAA >60  --   --  >60 >60  ANIONGAP 12  --   --  14 11   < > = values in this interval not displayed.     Hematology Recent Labs  Lab 02/08/20 0158 02/08/20 0158 02/09/20 0104 02/09/20 0104 02/09/20 1708 02/09/20 1717 02/10/20 0005  WBC 6.3  --  6.5  --   --   --  5.8  RBC 4.73  --  4.99  --   --   --  5.07  HGB 11.8*   < > 12.7*   < > 13.6 13.9 12.6*  HCT 38.5*   < > 40.9   < > 40.0 41.0 40.7  MCV 81.4  --  82.0  --   --   --  80.3  MCH 24.9*  --  25.5*  --   --   --  24.9*  MCHC 30.6  --  31.1  --   --   --  31.0  RDW 15.6*  --  15.9*  --   --   --  15.7*  PLT 278  --  306  --   --   --  265   < > = values in this interval not displayed.    BNPNo results for input(s): BNP, PROBNP in the last 168 hours.   DDimer No results for input(s): DDIMER in the last 168 hours.   Radiology    CARDIAC CATHETERIZATION  Result Date: 02/09/2020 Conclusions: 1. No angiographically significant  coronary artery disease. 2. Upper normal left heart, right heart, and pulmonary artery pressures. 3. Mildly reduced Fick cardiac output/index. Recommendations: 1. Restart heparin infusion 2 hours after TR band removal.  If there is no evidence of bleeding/vascular complication, consider transitioning back to apixaban as soon as tomorrow. 2. Proceed with TEE/cardioversion as soon as tomorrow.  I will make Mr. Vossler NPO after midnight in anticipation of this. 3. Optimize evidence-based heart failure therapy for non-ischemic cardiomyopathy. 4. Primary prevention of coronary artery disease. Nelva Bush, MD [facility]    Cardiac Studies   Echo personally reviewed EF 15% 02/02/20 1. While there is no visualized LV thrombus on sweeps of the apex,  patient did not have IV access, unable to use echo contrast for  confirmation. Left ventricular ejection fraction, by estimation, is <20%.  The left ventricle has severely decreased  function. The left ventricle demonstrates global hypokinesis. The left  ventricular internal cavity size was moderately dilated. Left ventricular  diastolic function could not be evaluated.  2. Right ventricular systolic function is normal. The right ventricular  size is mildly enlarged. There is mildly elevated pulmonary artery  systolic pressure.  3. Left atrial size was severely dilated.  4. Right atrial size was severely dilated.  5. Moderate pleural effusion in both left and right lateral regions.  6. The mitral valve is normal in structure. Mild mitral valve  regurgitation.  7. The aortic valve is tricuspid. There is mild calcification of the  aortic valve. Aortic valve regurgitation is not visualized. Mild aortic  valve sclerosis is present, with no evidence of aortic valve stenosis.  8. The inferior vena cava is dilated in size with <50% respiratory  variability, suggesting right atrial pressure of 15 mmHg.   R/LHC 02/09/20: Conclusions: 1. No  angiographically significant coronary artery disease. 2. Upper normal  left heart, right heart, and pulmonary artery pressures. 3. Mildly reduced Fick cardiac output/index.  Recommendations: 1. Restart heparin infusion 2 hours after TR band removal.  If there is no evidence of bleeding/vascular complication, consider transitioning back to apixaban as soon as tomorrow. 2. Proceed with TEE/cardioversion as soon as tomorrow.  I will make Mr. Radler NPO after midnight in anticipation of this. 3. Optimize evidence-based heart failure therapy for non-ischemic cardiomyopathy. 4. Primary prevention of coronary artery disease.  Right Heart Pressures RA (mean): 6 mmHg RV (S/EDP): 30/6 mmHg PA (S/D, mean): 35/18 (24) mmHg PCWP (mean): 10-15 mmHg  Ao sat: 93% PA sat: 58%  Fick CO: 5.0 L/min Fick CI: 2.2 L/min/m^2      Patient Profile     69 y.o. 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 Now improving with diuresis. No obstructive CAD on cath.  Assessment & Plan    #Aflutter: Remains in flutter with HR 110s. CHADs-vasc 3. Plan for DCCV today -Stop heparin gtt and resume home apixaban 5mg  BID  -Follow-up RHC/LHC  -Continue low dose metoprolol for rate control -Plan for TEE/DCCV today  #Acute systolic heart failure: TTE with LVEF <20% in the setting of Aflutter with RVR. Clean coronaries on cath. RHC numbers with normal filling pressures. -No obstructive CAD on cath -RHC with RAP 6, RV 30/6, PA 35/18, PCWP 10-15 consistent with normal filling pressures -Weaned off lasix gtt and transition to torsemide 40mg  BID; goal net even-->will likely have to continue to adjust as out-patient as diuretic requirement may change once back in rhythm -Continue low dose metop -Unfortunately, blood pressure is limiting our ability to add GDMT; will need to continue to add as out-patient  #Hypokalemia: -Will give 118mEq  this AM as K 3.1 with hypokalemia yesterday as well in the setting of aggressive diuresis -Will start daily potassium 91mEq  #Chronic Pain: -IM following, appreciate recommendations   Gwyndolyn Kaufman, MD [place]   For questions or updates, please contact [place] Please consult [email or web address] for contact info under        Signed, Freada Bergeron, MD  02/10/2020, 8:28 AM

## 2020-02-10 NOTE — Progress Notes (Signed)
PT Cancellation Note  Patient Details Name: Thomas [name] MRN: [id number] DOB: October 21, 1950   Cancelled Treatment:    Reason Eval/Treat Not Completed: Other (comment). Pt upset about not being able to have procedure today. Wants to talk to wife and doctor. Pt deferred therapy at this time.    Shary Decamp [facility] 02/10/2020, 12:31 PM [place] Pager [phone] Office [phone]

## 2020-02-10 NOTE — Progress Notes (Signed)
Report given to Sullivan Lone), RN. Patient is alert and oriented x4 resting in bed with call light in reach. Low BP noted overnight with intermittent drowsiness. VS better this AM. Right radial and brachial sites are a level 0.  Plan is for a TEE/DCCV today. Good urine output noted. Heparin gtt infusing.  Hgb 12.6, platelets 265, K+ 3.1, Creat 0.95 and Mg 1.7 this AM.

## 2020-02-11 ENCOUNTER — Inpatient Hospital Stay (HOSPITAL_COMMUNITY): Payer: Medicare Other

## 2020-02-11 ENCOUNTER — Encounter (HOSPITAL_COMMUNITY)
Admission: AD | Disposition: A | Discharge status: Home / Self Care | Payer: Self-pay | Source: Ambulatory Visit | Attending: Cardiology

## 2020-02-11 ENCOUNTER — Inpatient Hospital Stay (HOSPITAL_COMMUNITY): Payer: Medicare Other | Admitting: Certified Registered Nurse Anesthetist

## 2020-02-11 ENCOUNTER — Other Ambulatory Visit: Payer: Self-pay | Admitting: Student

## 2020-02-11 ENCOUNTER — Encounter (HOSPITAL_COMMUNITY): Payer: Self-pay | Admitting: Cardiology

## 2020-02-11 DIAGNOSIS — I340 Nonrheumatic mitral (valve) insufficiency: Secondary | ICD-10-CM

## 2020-02-11 DIAGNOSIS — I4892 Unspecified atrial flutter: Secondary | ICD-10-CM | POA: Diagnosis not present

## 2020-02-11 DIAGNOSIS — I5021 Acute systolic (congestive) heart failure: Secondary | ICD-10-CM | POA: Diagnosis not present

## 2020-02-11 DIAGNOSIS — I361 Nonrheumatic tricuspid (valve) insufficiency: Secondary | ICD-10-CM

## 2020-02-11 DIAGNOSIS — I5043 Acute on chronic combined systolic (congestive) and diastolic (congestive) heart failure: Secondary | ICD-10-CM

## 2020-02-11 DIAGNOSIS — E876 Hypokalemia: Secondary | ICD-10-CM

## 2020-02-11 DIAGNOSIS — Z79899 Other long term (current) drug therapy: Secondary | ICD-10-CM

## 2020-02-11 DIAGNOSIS — I483 Typical atrial flutter: Secondary | ICD-10-CM | POA: Diagnosis not present

## 2020-02-11 DIAGNOSIS — Q211 Atrial septal defect: Secondary | ICD-10-CM | POA: Diagnosis not present

## 2020-02-11 HISTORY — PX: BUBBLE STUDY: SHX6837

## 2020-02-11 HISTORY — PX: TEE WITHOUT CARDIOVERSION: SHX5443

## 2020-02-11 HISTORY — PX: CARDIOVERSION: SHX1299

## 2020-02-11 LAB — BASIC METABOLIC PANEL
Anion gap: 10 (ref 5–15)
BUN: 15 mg/dL (ref 8–23)
CO2: 35 mmol/L — ABNORMAL HIGH (ref 22–32)
Calcium: 8.9 mg/dL (ref 8.9–10.3)
Chloride: 93 mmol/L — ABNORMAL LOW (ref 98–111)
Creatinine, Ser: 0.99 mg/dL (ref 0.61–1.24)
GFR, Estimated: >60 mL/min (ref >60)
Glucose, Bld: 102 mg/dL — ABNORMAL HIGH (ref 70–99)
Potassium: 3.3 mmol/L — ABNORMAL LOW (ref 3.5–5.1)
Sodium: 138 mmol/L (ref 135–145)

## 2020-02-11 LAB — CBC
HCT: 42.4 % (ref 39.0–52.0)
Hemoglobin: 12.8 g/dL — ABNORMAL LOW (ref 13.0–17.0)
MCH: 24.8 pg — ABNORMAL LOW (ref 26.0–34.0)
MCHC: 30.2 g/dL (ref 30.0–36.0)
MCV: 82.2 fL (ref 80.0–100.0)
Platelets: 256 10*3/uL (ref 150–400)
RBC: 5.16 MIL/uL (ref 4.22–5.81)
RDW: 15.9 % — ABNORMAL HIGH (ref 11.5–15.5)
WBC: 6.4 10*3/uL (ref 4.0–10.5)
nRBC: 0 % (ref 0.0–0.2)

## 2020-02-11 LAB — LACTIC ACID, PLASMA: Lactic Acid, Venous: 1.6 mmol/L (ref 0.5–1.9)

## 2020-02-11 LAB — MRSA PCR SCREENING: MRSA by PCR: NEGATIVE

## 2020-02-11 LAB — MAGNESIUM: Magnesium: 1.7 mg/dL (ref 1.7–2.4)

## 2020-02-11 SURGERY — ECHOCARDIOGRAM, TRANSESOPHAGEAL
Anesthesia: Monitor Anesthesia Care

## 2020-02-11 MED ORDER — NARCAN 4 MG/0.1ML NA LIQD: 1.0000 | NASAL | 0 refills | Status: DC | PRN

## 2020-02-11 MED ORDER — TORSEMIDE 20 MG PO TABS
20.0000 mg | ORAL_TABLET | Freq: Two times a day (BID) | ORAL | Status: DC
  Administered 2020-02-11 – 2020-02-12 (×3): 20 mg via ORAL
  Filled 2020-02-11 (×3): qty 1

## 2020-02-11 MED ORDER — LIDOCAINE 2% (20 MG/ML) 5 ML SYRINGE
INTRAMUSCULAR | Status: DC | PRN
  Administered 2020-02-11: 40 mg via INTRAVENOUS

## 2020-02-11 MED ORDER — LACTATED RINGERS IV SOLN: INTRAVENOUS | Status: DC | PRN

## 2020-02-11 MED ORDER — APIXABAN 5 MG PO TABS: 5.0000 mg | ORAL_TABLET | Freq: Two times a day (BID) | ORAL | 3 refills | Status: DC

## 2020-02-11 MED ORDER — OXYCODONE HCL 10 MG PO TABS
10.0000 mg | ORAL_TABLET | ORAL | 0 refills | Status: AC
Start: 2020-02-11 — End: 2020-02-18

## 2020-02-11 MED ORDER — TORSEMIDE 20 MG PO TABS: 20.0000 mg | ORAL_TABLET | Freq: Two times a day (BID) | ORAL | 2 refills | Status: DC

## 2020-02-11 MED ORDER — CHLORHEXIDINE GLUCONATE CLOTH 2 % EX PADS
6.0000 | MEDICATED_PAD | Freq: Every day | CUTANEOUS | Status: DC
  Administered 2020-02-11: 6 via TOPICAL

## 2020-02-11 MED ORDER — MAGNESIUM SULFATE 2 GM/50ML IV SOLN
2.0000 g | Freq: Once | INTRAVENOUS | Status: AC
  Administered 2020-02-11: 2 g via INTRAVENOUS
  Filled 2020-02-11: qty 50

## 2020-02-11 MED ORDER — PHENYLEPHRINE 40 MCG/ML (10ML) SYRINGE FOR IV PUSH (FOR BLOOD PRESSURE SUPPORT)
PREFILLED_SYRINGE | INTRAVENOUS | Status: DC | PRN
  Administered 2020-02-11: 40 ug via INTRAVENOUS
  Administered 2020-02-11 (×3): 120 ug via INTRAVENOUS

## 2020-02-11 MED ORDER — NOREPINEPHRINE 4 MG/250ML-% IV SOLN
0.0000 ug/min | INTRAVENOUS | Status: DC
  Administered 2020-02-11: 4 ug/min via INTRAVENOUS

## 2020-02-11 MED ORDER — ATORVASTATIN CALCIUM 40 MG PO TABS: 40.0000 mg | ORAL_TABLET | Freq: Every day | ORAL | 2 refills | Status: DC

## 2020-02-11 MED ORDER — AMIODARONE HCL 200 MG PO TABS
200.0000 mg | ORAL_TABLET | Freq: Two times a day (BID) | ORAL | Status: DC
  Administered 2020-02-11 – 2020-02-12 (×2): 200 mg via ORAL
  Filled 2020-02-11 (×2): qty 1

## 2020-02-11 MED ORDER — POTASSIUM CHLORIDE CRYS ER 20 MEQ PO TBCR: 40.0000 meq | EXTENDED_RELEASE_TABLET | Freq: Two times a day (BID) | ORAL | 2 refills | Status: DC

## 2020-02-11 MED ORDER — AMIODARONE HCL 200 MG PO TABS: 200.0000 mg | ORAL_TABLET | Freq: Every day | ORAL | 2 refills | Status: DC

## 2020-02-11 MED ORDER — BUTAMBEN-TETRACAINE-BENZOCAINE 2-2-14 % EX AERO
INHALATION_SPRAY | CUTANEOUS | Status: DC | PRN
  Administered 2020-02-11: 2 via TOPICAL

## 2020-02-11 MED ORDER — DULOXETINE HCL 60 MG PO CPEP
60.0000 mg | ORAL_CAPSULE | Freq: Every day | ORAL | 0 refills | Status: DC
Start: 2020-02-12 — End: 2020-03-23

## 2020-02-11 MED ORDER — POTASSIUM CHLORIDE CRYS ER 20 MEQ PO TBCR
40.0000 meq | EXTENDED_RELEASE_TABLET | Freq: Once | ORAL | Status: AC
  Administered 2020-02-11: 40 meq via ORAL

## 2020-02-11 MED ORDER — POTASSIUM CHLORIDE CRYS ER 20 MEQ PO TBCR: 40.0000 meq | EXTENDED_RELEASE_TABLET | Freq: Once | ORAL | Status: AC

## 2020-02-11 MED ORDER — POTASSIUM CHLORIDE CRYS ER 20 MEQ PO TBCR
40.0000 meq | EXTENDED_RELEASE_TABLET | Freq: Two times a day (BID) | ORAL | Status: DC
  Administered 2020-02-11 – 2020-02-12 (×2): 40 meq via ORAL
  Filled 2020-02-11 (×3): qty 2

## 2020-02-11 MED ORDER — PROPOFOL 500 MG/50ML IV EMUL
INTRAVENOUS | Status: DC | PRN
  Administered 2020-02-11: 100 ug/kg/min via INTRAVENOUS

## 2020-02-11 MED ORDER — ALBUMIN HUMAN 5 % IV SOLN
12.5000 g | Freq: Once | INTRAVENOUS | Status: AC
  Administered 2020-02-11: 12.5 g via INTRAVENOUS

## 2020-02-11 MED ORDER — ALBUMIN HUMAN 25 % IV SOLN: 12.5000 g | Freq: Once | INTRAVENOUS | Status: DC

## 2020-02-11 NOTE — Transfer of Care (Signed)
Immediate Anesthesia Transfer of Care Note  Patient: Thomas [name]  Procedure(s) Performed: TRANSESOPHAGEAL ECHOCARDIOGRAM (TEE) (N/A ) CARDIOVERSION (N/A ) BUBBLE STUDY  Patient Location: Endoscopy Unit  Anesthesia Type:General  Level of Consciousness: awake, alert  and oriented  Airway & Oxygen Therapy: Patient Spontanous Breathing and Patient connected to nasal cannula oxygen  Post-op Assessment: Report given to RN and Post -op Vital signs reviewed and stable  Post vital signs: Reviewed and stable  Last Vitals:  Vitals Value Taken Time  BP 97/68 02/11/20 1530  Temp    Pulse 76   Resp 28 02/11/20 1532  SpO2 99   Vitals shown include unvalidated device data.  Last Pain:  Vitals:   02/11/20 1530  TempSrc:   PainSc: 0-No pain      Patients Stated Pain Goal: 4 (68/38/70 6582)  Complications: No complications documented.

## 2020-02-11 NOTE — Progress Notes (Signed)
Progress Note  Patient Name: Thomas [name] Date of Encounter: 02/11/2020  [facility] Cardiologist: Freada Bergeron, MD   Subjective  Patient very upset that unable to get TEE/DCCV and wanted to leave AMA. Spoke to him at length on the phone and he was agreeable to stay overnight with plans for procedure today. Will plan to discharge post-procedure even though we have not fully worked out his diuretic regimen as patient is adamant about going home.   This morning, he states he feels well. He is anxious to have the procedure completed.   Had episoode of Afib with RVR with HR 150s. Improved without intervention and patient asymptomatic. Now back in Aflutter with HR 110-120s.  NPO for TEE/DCCV today.  Inpatient Medications    Scheduled Meds: . acetaminophen  1,000 mg Oral TID  . apixaban  5 mg Oral BID  . atorvastatin  40 mg Oral Daily  . collagenase   Topical Daily  . diclofenac Sodium  2 g Topical BID  . DULoxetine  60 mg Oral Daily  . metoprolol tartrate  25 mg Oral TID  . oxyCODONE  10 mg Oral Q4H while awake  . polyethylene glycol  17 g Oral BID  . potassium chloride  40 mEq Oral Once  . potassium chloride  40 mEq Oral Once  . potassium chloride  40 mEq Oral BID  . senna  1 tablet Oral Daily  . sodium chloride flush  3 mL Intravenous Q12H  . sodium chloride flush  3 mL Intravenous Q12H  . sodium chloride flush  3 mL Intravenous Q12H  . torsemide  20 mg Oral BID   Continuous Infusions: . sodium chloride    . sodium chloride    . sodium chloride 20 mL/hr at 02/09/20 2336  . ondansetron (ZOFRAN) IV     PRN Meds: sodium chloride, sodium chloride, ondansetron (ZOFRAN) IV, sodium chloride flush, sodium chloride flush   Vital Signs    Vitals:   02/10/20 2248 02/11/20 0024 02/11/20 0345 02/11/20 0600  BP:  99/71 94/64 111/72  Pulse:  (!) 115 (!) 115   Resp:  18 16   Temp:  (!) 97.5 F (36.4 C) (!) 97.3 F (36.3 C)   TempSrc:  Oral Oral   SpO2: 98% 94%  98%   Weight:   84.3 kg   Height:        Intake/Output Summary (Last 24 hours) at 02/11/2020 0749 Last data filed at 02/11/2020 0345 Gross per 24 hour  Intake 90.3 ml  Output 1425 ml  Net -1334.7 ml   Last 3 Weights 02/11/2020 02/10/2020 02/09/2020  Weight (lbs) 185 lb 13.6 oz 189 lb 6 oz 193 lb 9 oz  Weight (kg) 84.3 kg 85.9 kg 87.8 kg      Telemetry    Aflutter with HR 110-120s currently; epsiode of Afib to 150s overnight - Personally Reviewed  ECG    Aflutter with HR 115- Personally Reviewed  Physical Exam   GEN: Sitting up in bed, alert, comfortable Neck: No JVD Cardiac: Tachycardic, regular, no murmurs.  Respiratory: CTAB GI: Soft, nontender, non-distended  MS: 1+ pitting edema to the knees. Warm. Neuro:  Nonfocal  Psych: Normal affect   Labs    High Sensitivity Troponin:   Recent Labs  Lab 02/04/20 0948 02/04/20 1112  TROPONINIHS 18* 23*      Chemistry Recent Labs  Lab 02/09/20 1813 02/10/20 0005 02/11/20 0341  NA 137 136 138  K 2.9* 3.1* 3.3*  CL 90* 93* 93*  CO2 33* 32 35*  GLUCOSE 97 148* 102*  BUN 17 16 15   CREATININE 1.05 0.95 0.99  CALCIUM 8.6* 8.5* 8.9  GFRNONAA >60 >60 >60  ANIONGAP 14 11 10      Hematology Recent Labs  Lab 02/09/20 0104 02/09/20 1708 02/09/20 1717 02/10/20 0005 02/11/20 0341  WBC 6.5  --   --  5.8 6.4  RBC 4.99  --   --  5.07 5.16  HGB 12.7*   < > 13.9 12.6* 12.8*  HCT 40.9   < > 41.0 40.7 42.4  MCV 82.0  --   --  80.3 82.2  MCH 25.5*  --   --  24.9* 24.8*  MCHC 31.1  --   --  31.0 30.2  RDW 15.9*  --   --  15.7* 15.9*  PLT 306  --   --  265 256   < > = values in this interval not displayed.    BNPNo results for input(s): BNP, PROBNP in the last 168 hours.   DDimer No results for input(s): DDIMER in the last 168 hours.   Radiology    CARDIAC CATHETERIZATION  Result Date: 02/09/2020 Conclusions: 1. No angiographically significant coronary artery disease. 2. Upper normal left heart, right heart,  and pulmonary artery pressures. 3. Mildly reduced Fick cardiac output/index. Recommendations: 1. Restart heparin infusion 2 hours after TR band removal.  If there is no evidence of bleeding/vascular complication, consider transitioning back to apixaban as soon as tomorrow. 2. Proceed with TEE/cardioversion as soon as tomorrow.  I will make Mr. Muldrew NPO after midnight in anticipation of this. 3. Optimize evidence-based heart failure therapy for non-ischemic cardiomyopathy. 4. Primary prevention of coronary artery disease. Nelva Bush, MD [facility]    Cardiac Studies   Echo personally reviewed EF 15% 02/02/20 1. While there is no visualized LV thrombus on sweeps of the apex,  patient did not have IV access, unable to use echo contrast for  confirmation. Left ventricular ejection fraction, by estimation, is <20%.  The left ventricle has severely decreased  function. The left ventricle demonstrates global hypokinesis. The left  ventricular internal cavity size was moderately dilated. Left ventricular  diastolic function could not be evaluated.  2. Right ventricular systolic function is normal. The right ventricular  size is mildly enlarged. There is mildly elevated pulmonary artery  systolic pressure.  3. Left atrial size was severely dilated.  4. Right atrial size was severely dilated.  5. Moderate pleural effusion in both left and right lateral regions.  6. The mitral valve is normal in structure. Mild mitral valve  regurgitation.  7. The aortic valve is tricuspid. There is mild calcification of the  aortic valve. Aortic valve regurgitation is not visualized. Mild aortic  valve sclerosis is present, with no evidence of aortic valve stenosis.  8. The inferior vena cava is dilated in size with <50% respiratory  variability, suggesting right atrial pressure of 15 mmHg.   R/LHC 02/09/20: Conclusions: 1. No angiographically significant coronary artery disease. 2. Upper  normal left heart, right heart, and pulmonary artery pressures. 3. Mildly reduced Fick cardiac output/index.  Recommendations: 1. Restart heparin infusion 2 hours after TR band removal.  If there is no evidence of bleeding/vascular complication, consider transitioning back to apixaban as soon as tomorrow. 2. Proceed with TEE/cardioversion as soon as tomorrow.  I will make Mr. Boehning NPO after midnight in anticipation of this. 3. Optimize evidence-based heart failure therapy for non-ischemic cardiomyopathy.  4. Primary prevention of coronary artery disease.  Right Heart Pressures RA (mean): 6 mmHg RV (S/EDP): 30/6 mmHg PA (S/D, mean): 35/18 (24) mmHg PCWP (mean): 10-15 mmHg  Ao sat: 93% PA sat: 58%  Fick CO: 5.0 L/min Fick CI: 2.2 L/min/m^2      Patient Profile     69 y.o. 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 Now improving with diuresis. No obstructive CAD on cath. Plan for TEE and possible DCCV today.  Assessment & Plan    #Aflutter: Remains in flutter with HR 110s. CHADs-vasc 3. Plan for DCCV today. -Plan for TEE/DCCV today -Patient is adamant about leaving post-DCCV if stable; will arrange for -Plan to discharge on apixaban 5mg  BID  -Will start amiodarone 200mg  BID post-ablation to try to keep him in rhythm. AAD options limited due to EF<20% -Will arrange for follow-up in Afib clinic  #Acute systolic heart failure: TTE with LVEF <20% in the setting of Aflutter with RVR. Clean coronaries on cath. RHC numbers with normal filling pressures. -No obstructive CAD on cath -RHC with RAP 6, RV 30/6, PA 35/18, PCWP 10-15 consistent with normal filling pressures -Off lasix gtt and transitioned to torsemide-->net negative 1L overnight; will decrease dosing to 20mg  BID for goal net even -Continue low dose metop-->will convert to long-acting post-DCCV pending HR -Plan to start amiodarone 200mg   BID post-DCCV -Unfortunately, blood pressure is limiting our ability to add GDMT; will need to continue to add as out-patient  #Hypokalemia: -Has been low with diuresis requiring frequent supplementation -Will increase PO potassium to 3mEq BID and aggressively replete this AM  #Chronic Pain: -IM following, appreciate recommendations   Gwyndolyn Kaufman, MD [place]   For questions or updates, please contact [place] Please consult [email or web address] for contact info under        Signed, Freada Bergeron, MD  02/11/2020, 7:49 AM

## 2020-02-11 NOTE — Plan of Care (Signed)
  Problem: Education: Goal: Knowledge of General Education information will improve Description: Including pain rating scale, medication(s)/side effects and non-pharmacologic comfort measures Outcome: Progressing   Problem: Health Behavior/Discharge Planning: Goal: Ability to manage health-related needs will improve Outcome: Progressing   Problem: Clinical Measurements: Goal: Ability to maintain clinical measurements within normal limits will improve Outcome: Progressing Goal: Will remain free from infection Outcome: Progressing Goal: Diagnostic test results will improve Outcome: Progressing Goal: Respiratory complications will improve Outcome: Progressing Goal: Cardiovascular complication will be avoided Outcome: Progressing   Problem: Activity: Goal: Risk for activity intolerance will decrease Outcome: Progressing   Problem: Nutrition: Goal: Adequate nutrition will be maintained Outcome: Progressing   Problem: Coping: Goal: Level of anxiety will decrease Outcome: Progressing   Problem: Elimination: Goal: Will not experience complications related to bowel motility Outcome: Progressing Goal: Will not experience complications related to urinary retention Outcome: Progressing   Problem: Pain Managment: Goal: General experience of comfort will improve Outcome: Progressing   Problem: Safety: Goal: Ability to remain free from injury will improve Outcome: Progressing   Problem: Skin Integrity: Goal: Risk for impaired skin integrity will decrease Outcome: Progressing   Problem: Education: Goal: Ability to demonstrate management of disease process will improve Outcome: Progressing Goal: Ability to verbalize understanding of medication therapies will improve Outcome: Progressing   Problem: Activity: Goal: Capacity to carry out activities will improve Outcome: Progressing   Problem: Cardiac: Goal: Ability to achieve and maintain adequate cardiopulmonary perfusion  will improve Outcome: Progressing   Problem: Education: Goal: Understanding of CV disease, CV risk reduction, and recovery process will improve Outcome: Progressing   Problem: Cardiovascular: Goal: Ability to achieve and maintain adequate cardiovascular perfusion will improve Outcome: Progressing Goal: Vascular access site(s) Level 0-1 will be maintained Outcome: Progressing

## 2020-02-11 NOTE — Progress Notes (Signed)
SLP Cancellation Note  Patient Details Name: Thomas [name] MRN: [id number] DOB: 07-09-1950   Cancelled treatment:       Reason Eval/Treat Not Completed: Patient not medically ready (Pt is currently NPO for TEE. SLP will follow up. )  Juanell Saffo I. Hardin Negus, [place], [place] Office number [phone] Pager [phone]  Horton Marshall 02/11/2020, 9:00 AM

## 2020-02-11 NOTE — Progress Notes (Signed)
Report given to Vaughan Basta, RN and Conley Rolls, Therapist, sports. Patient is alert and oriented x4 sitting up in bed with call light in reach. VSS. Plan is for a TEE/DCCV today then home if stable. Monitor BP. Right radial and brachial sites are a level 0 s/p caths. Hgb 12.8, platelets 256, K+ 3.3, Creat 0.99 and Mg 1.7.

## 2020-02-11 NOTE — Progress Notes (Signed)
Family Medicine Teaching Service Daily Progress Note Intern Pager: [phone]  Patient name: Thomas [name] Medical record number: [id number] Date of birth: 16-Aug-1950 Age: 69 y.o. Gender: male  Primary Care Provider: [name], MD Consultants: Cardiology Code Status: Full  Pt Overview and Major Events to Date:  10/11 Admitted 10/18 Left/Right Heart Cath  Assessment and Plan:  Thomas [name] is a69 yr oldmale who presentswith SOBdue to acute systolic HF exacerbation and a flutter, PMHxsignificant for rheumatoid arthritis, atrial flutter, newly diagnosed acute systolicHR with reduced EF, and recentGreat toe amputation and left second metatarsal osteotomy   Polyarthropathy Chronic Pain  Uncontrolled Right Foot Pain: Chronic pain in multiple joints due to osteoarthritisand recentsurgery. Home medication is Percocet 10-325 q4 PRN. Previously able to do ADL's but recently unable to. Podiatry saw patient10/14and recommend cleaning routine and will follow up outpatient. He reports that  -ContinueOxycodone 10 mg q4h while awake (home dose) -Scheduled Tylenol1000 mg TID -ContinueCymbalta 60 mg daily   A flutter RVR: CHA2DS2-VASc score is 3 (age, HTN, CHF) Mag is 2.0. Cardiology is managing -Continue Metoprolol 25 mg TID -Continue Eliquis 5 mg BID -Kdur 40 mEq daily ordered -Per cards (10/12)Keep K>4 and Mag >2   FEN/GI: NPO until procdure PPx: Eliquis  Disposition: Progressive, wants D/C today  Prior to Admission Living Arrangement: Home Anticipated Discharge Location: Home Barriers to Discharge: Cardiac Management Anticipated discharge in approximately 0-2 day(s).   Subjective:  Interviewed patient at bedside.  He reports the pain in his legs is continuing to improve as the swelling improves. Reports wanting to continue to Cymbalta on discharge. Reports that his pain is controlled.   Objective: Temp:  [97.3 F (36.3 C)-97.6 F (36.4  C)] 97.3 F (36.3 C) (10/20 0345) Pulse Rate:  [113-115] 115 (10/20 0345) Resp:  [15-18] 16 (10/20 0345) BP: (88-117)/(64-84) 111/72 (10/20 0600) SpO2:  [92 %-100 %] 98 % (10/20 0345) Weight:  [185 lb 13.6 oz (84.3 kg)] 185 lb 13.6 oz (84.3 kg) (10/20 0345) Physical Exam:  Physical Exam Vitals and nursing note reviewed.  Constitutional:      General: He is not in acute distress.    Appearance: Normal appearance. He is normal weight. He is not ill-appearing, toxic-appearing or diaphoretic.  HENT:     Head: Normocephalic and atraumatic.  Cardiovascular:     Rate and Rhythm: Regular rhythm. Tachycardia present.     Pulses: Normal pulses.          Radial pulses are 2+ on the right side and 2+ on the left side.       Dorsalis pedis pulses are 2+ on the right side and 2+ on the left side.     Heart sounds: Normal heart sounds, S1 normal and S2 normal. No murmur heard.   Pulmonary:     Effort: Pulmonary effort is normal. No respiratory distress.     Breath sounds: Normal breath sounds. No wheezing.  Abdominal:     General: There is no distension.     Palpations: There is no mass.     Tenderness: There is no abdominal tenderness.  Musculoskeletal:     Right lower leg: 1+ Pitting Edema present.     Left lower leg: 1+ Pitting Edema present.     Comments: Bilateral 1+ pitting edema extending up to mid tibia  Neurological:     Mental Status: He is alert. Mental status is at baseline.  Psychiatric:        Mood and Affect: Mood normal.  Behavior: Behavior normal.        Thought Content: Thought content normal.      Laboratory: Recent Labs  Lab 02/09/20 0104 02/09/20 1708 02/09/20 1717 02/10/20 0005 02/11/20 0341  WBC 6.5  --   --  5.8 6.4  HGB 12.7*   < > 13.9 12.6* 12.8*  HCT 40.9   < > 41.0 40.7 42.4  PLT 306  --   --  265 256   < > = values in this interval not displayed.   Recent Labs  Lab 02/09/20 1813 02/10/20 0005 02/11/20 0341  NA 137 136 138  K 2.9*  3.1* 3.3*  CL 90* 93* 93*  CO2 33* 32 35*  BUN 17 16 15   CREATININE 1.05 0.95 0.99  CALCIUM 8.6* 8.5* 8.9  GLUCOSE 97 148* 102*    Mag: 1.7  Imaging/Diagnostic Tests:   Right/Left Heart Cath: No significant coronary artery disease.   DG Toe Great Right:Prior 1st toe amputation with soft tissue swelling at the site. Underlying 1st metatarsal head appears stable since September. No strong evidence of osteomyelitis. Calcified peripheral vascular disease.   Thomas Cedar, MD 02/11/2020, 7:01 AM PGY-1, [place] Intern pager: [phone], text pages welcome

## 2020-02-11 NOTE — Progress Notes (Signed)
Patient taken to endo for TEE/cardioversion at 1347hrs.

## 2020-02-11 NOTE — Anesthesia Procedure Notes (Signed)
Procedure Name: MAC Date/Time: 02/11/2020 2:35 PM Performed by: Alain Marion, CRNA Pre-anesthesia Checklist: Patient identified, Emergency Drugs available, Suction available and Patient being monitored Patient Re-evaluated:Patient Re-evaluated prior to induction Oxygen Delivery Method: Nasal cannula Induction Type: IV induction Placement Confirmation: positive ETCO2

## 2020-02-11 NOTE — H&P (View-Only) (Signed)
Progress Note  Patient Name: Thomas [name] Date of Encounter: 02/11/2020  [facility] Cardiologist: Freada Bergeron, MD   Subjective  Patient very upset that unable to get TEE/DCCV and wanted to leave AMA. Spoke to him at length on the phone and he was agreeable to stay overnight with plans for procedure today. Will plan to discharge post-procedure even though we have not fully worked out his diuretic regimen as patient is adamant about going home.   This morning, he states he feels well. He is anxious to have the procedure completed.   Had episoode of Afib with RVR with HR 150s. Improved without intervention and patient asymptomatic. Now back in Aflutter with HR 110-120s.  NPO for TEE/DCCV today.  Inpatient Medications    Scheduled Meds: . acetaminophen  1,000 mg Oral TID  . apixaban  5 mg Oral BID  . atorvastatin  40 mg Oral Daily  . collagenase   Topical Daily  . diclofenac Sodium  2 g Topical BID  . DULoxetine  60 mg Oral Daily  . metoprolol tartrate  25 mg Oral TID  . oxyCODONE  10 mg Oral Q4H while awake  . polyethylene glycol  17 g Oral BID  . potassium chloride  40 mEq Oral Once  . potassium chloride  40 mEq Oral Once  . potassium chloride  40 mEq Oral BID  . senna  1 tablet Oral Daily  . sodium chloride flush  3 mL Intravenous Q12H  . sodium chloride flush  3 mL Intravenous Q12H  . sodium chloride flush  3 mL Intravenous Q12H  . torsemide  20 mg Oral BID   Continuous Infusions: . sodium chloride    . sodium chloride    . sodium chloride 20 mL/hr at 02/09/20 2336  . ondansetron (ZOFRAN) IV     PRN Meds: sodium chloride, sodium chloride, ondansetron (ZOFRAN) IV, sodium chloride flush, sodium chloride flush   Vital Signs    Vitals:   02/10/20 2248 02/11/20 0024 02/11/20 0345 02/11/20 0600  BP:  99/71 94/64 111/72  Pulse:  (!) 115 (!) 115   Resp:  18 16   Temp:  (!) 97.5 F (36.4 C) (!) 97.3 F (36.3 C)   TempSrc:  Oral Oral   SpO2: 98% 94%  98%   Weight:   84.3 kg   Height:        Intake/Output Summary (Last 24 hours) at 02/11/2020 0749 Last data filed at 02/11/2020 0345 Gross per 24 hour  Intake 90.3 ml  Output 1425 ml  Net -1334.7 ml   Last 3 Weights 02/11/2020 02/10/2020 02/09/2020  Weight (lbs) 185 lb 13.6 oz 189 lb 6 oz 193 lb 9 oz  Weight (kg) 84.3 kg 85.9 kg 87.8 kg      Telemetry    Aflutter with HR 110-120s currently; epsiode of Afib to 150s overnight - Personally Reviewed  ECG    Aflutter with HR 115- Personally Reviewed  Physical Exam   GEN: Sitting up in bed, alert, comfortable Neck: No JVD Cardiac: Tachycardic, regular, no murmurs.  Respiratory: CTAB GI: Soft, nontender, non-distended  MS: 1+ pitting edema to the knees. Warm. Neuro:  Nonfocal  Psych: Normal affect   Labs    High Sensitivity Troponin:   Recent Labs  Lab 02/04/20 0948 02/04/20 1112  TROPONINIHS 18* 23*      Chemistry Recent Labs  Lab 02/09/20 1813 02/10/20 0005 02/11/20 0341  NA 137 136 138  K 2.9* 3.1* 3.3*  CL 90* 93* 93*  CO2 33* 32 35*  GLUCOSE 97 148* 102*  BUN 17 16 15   CREATININE 1.05 0.95 0.99  CALCIUM 8.6* 8.5* 8.9  GFRNONAA >60 >60 >60  ANIONGAP 14 11 10      Hematology Recent Labs  Lab 02/09/20 0104 02/09/20 1708 02/09/20 1717 02/10/20 0005 02/11/20 0341  WBC 6.5  --   --  5.8 6.4  RBC 4.99  --   --  5.07 5.16  HGB 12.7*   < > 13.9 12.6* 12.8*  HCT 40.9   < > 41.0 40.7 42.4  MCV 82.0  --   --  80.3 82.2  MCH 25.5*  --   --  24.9* 24.8*  MCHC 31.1  --   --  31.0 30.2  RDW 15.9*  --   --  15.7* 15.9*  PLT 306  --   --  265 256   < > = values in this interval not displayed.    BNPNo results for input(s): BNP, PROBNP in the last 168 hours.   DDimer No results for input(s): DDIMER in the last 168 hours.   Radiology    CARDIAC CATHETERIZATION  Result Date: 02/09/2020 Conclusions: 1. No angiographically significant coronary artery disease. 2. Upper normal left heart, right heart,  and pulmonary artery pressures. 3. Mildly reduced Fick cardiac output/index. Recommendations: 1. Restart heparin infusion 2 hours after TR band removal.  If there is no evidence of bleeding/vascular complication, consider transitioning back to apixaban as soon as tomorrow. 2. Proceed with TEE/cardioversion as soon as tomorrow.  I will make Mr. Chiu NPO after midnight in anticipation of this. 3. Optimize evidence-based heart failure therapy for non-ischemic cardiomyopathy. 4. Primary prevention of coronary artery disease. Nelva Bush, MD [facility]    Cardiac Studies   Echo personally reviewed EF 15% 02/02/20 1. While there is no visualized LV thrombus on sweeps of the apex,  patient did not have IV access, unable to use echo contrast for  confirmation. Left ventricular ejection fraction, by estimation, is <20%.  The left ventricle has severely decreased  function. The left ventricle demonstrates global hypokinesis. The left  ventricular internal cavity size was moderately dilated. Left ventricular  diastolic function could not be evaluated.  2. Right ventricular systolic function is normal. The right ventricular  size is mildly enlarged. There is mildly elevated pulmonary artery  systolic pressure.  3. Left atrial size was severely dilated.  4. Right atrial size was severely dilated.  5. Moderate pleural effusion in both left and right lateral regions.  6. The mitral valve is normal in structure. Mild mitral valve  regurgitation.  7. The aortic valve is tricuspid. There is mild calcification of the  aortic valve. Aortic valve regurgitation is not visualized. Mild aortic  valve sclerosis is present, with no evidence of aortic valve stenosis.  8. The inferior vena cava is dilated in size with <50% respiratory  variability, suggesting right atrial pressure of 15 mmHg.   R/LHC 02/09/20: Conclusions: 1. No angiographically significant coronary artery disease. 2. Upper  normal left heart, right heart, and pulmonary artery pressures. 3. Mildly reduced Fick cardiac output/index.  Recommendations: 1. Restart heparin infusion 2 hours after TR band removal.  If there is no evidence of bleeding/vascular complication, consider transitioning back to apixaban as soon as tomorrow. 2. Proceed with TEE/cardioversion as soon as tomorrow.  I will make Mr. Dalgleish NPO after midnight in anticipation of this. 3. Optimize evidence-based heart failure therapy for non-ischemic cardiomyopathy.  4. Primary prevention of coronary artery disease.  Right Heart Pressures RA (mean): 6 mmHg RV (S/EDP): 30/6 mmHg PA (S/D, mean): 35/18 (24) mmHg PCWP (mean): 10-15 mmHg  Ao sat: 93% PA sat: 58%  Fick CO: 5.0 L/min Fick CI: 2.2 L/min/m^2      Patient Profile     69 y.o. 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 Now improving with diuresis. No obstructive CAD on cath. Plan for TEE and possible DCCV today.  Assessment & Plan    #Aflutter: Remains in flutter with HR 110s. CHADs-vasc 3. Plan for DCCV today. -Plan for TEE/DCCV today -Patient is adamant about leaving post-DCCV if stable; will arrange for -Plan to discharge on apixaban 5mg  BID  -Will start amiodarone 200mg  BID post-ablation to try to keep him in rhythm. AAD options limited due to EF<20% -Will arrange for follow-up in Afib clinic  #Acute systolic heart failure: TTE with LVEF <20% in the setting of Aflutter with RVR. Clean coronaries on cath. RHC numbers with normal filling pressures. -No obstructive CAD on cath -RHC with RAP 6, RV 30/6, PA 35/18, PCWP 10-15 consistent with normal filling pressures -Off lasix gtt and transitioned to torsemide-->net negative 1L overnight; will decrease dosing to 20mg  BID for goal net even -Continue low dose metop-->will convert to long-acting post-DCCV pending HR -Plan to start amiodarone 200mg   BID post-DCCV -Unfortunately, blood pressure is limiting our ability to add GDMT; will need to continue to add as out-patient  #Hypokalemia: -Has been low with diuresis requiring frequent supplementation -Will increase PO potassium to 19mEq BID and aggressively replete this AM  #Chronic Pain: -IM following, appreciate recommendations   Gwyndolyn Kaufman, MD [place]   For questions or updates, please contact [place] Please consult [email or web address] for contact info under        Signed, Freada Bergeron, MD  02/11/2020, 7:49 AM

## 2020-02-11 NOTE — Progress Notes (Signed)
RECOVERING PT IN ENDO, PT BP DROPPED, 53/39. ANESTHESIA TEAM CALLED IN TO SEE PT. ALBUMIN STARTED, L/R BOLUS STARTED, LEVOPHED STARTED, NEW IV OBTAINED, LABS DRAWN, BP STABLE ON LEVOPHED. BED REQUEST FOR 2 HEART. CARDIOLOGIST AT BEDSIDE

## 2020-02-11 NOTE — CV Procedure (Signed)
   TRANSESOPHAGEAL ECHOCARDIOGRAM GUIDED DIRECT CURRENT CARDIOVERSION  NAME:  Thomas [name]    MRN: [id number] DOB:  03-31-1951    ADMIT DATE: 02/02/2020  INDICATIONS: Symptomatic atrial flutter  PROCEDURE:   Informed consent was obtained prior to the procedure. The risks, benefits and alternatives for the procedure were discussed and the patient comprehended these risks.  Risks include, but are not limited to, cough, sore throat, vomiting, nausea, somnolence, esophageal and stomach trauma or perforation, bleeding, low blood pressure, aspiration, pneumonia, infection, trauma to the teeth and death.    After a procedural time-out, the oropharynx was anesthetized and the patient was sedated by the anesthesia service. The transesophageal probe was inserted in the esophagus and stomach without difficulty and multiple views were obtained. Anesthesia was monitored by Dr. Linna Caprice.   COMPLICATIONS:    Complications: No complications Patient tolerated procedure well.  KEY FINDINGS:  1. LVEF 10%.  2. No LAA thrombus.  3. Small PFO with L to R shunting. 4. Severe RV failure.  5. Full Report to follow.   CARDIOVERSION:     Indications:  Symptomatic Atrial Fibrillation  Procedure Details:  Once the TEE was complete, the patient had the defibrillator pads placed in the anterior and posterior position. Once an appropriate level of sedation was confirmed, the patient was cardioverted x 1 with 120J of biphasic synchronized energy.  The patient converted to NSR.  There were no apparent complications.  The patient had normal neuro status and respiratory status post procedure with vitals stable as recorded elsewhere.  Adequate airway was maintained throughout and vital signs monitored per protocol.  [place] T. Audie Box, [place]  [address], [place], [place] [address] [phone]  3:18 PM

## 2020-02-11 NOTE — Interval H&P Note (Signed)
History and Physical Interval Note:  02/11/2020 2:26 PM  [name]  has presented today for surgery, with the diagnosis of AFIB.  The various methods of treatment have been discussed with the patient and family. After consideration of risks, benefits and other options for treatment, the patient has consented to  Procedure(s): TRANSESOPHAGEAL ECHOCARDIOGRAM (TEE) (N/A) CARDIOVERSION (N/A) as a surgical intervention.  The patient's history has been reviewed, patient examined, no change in status, stable for surgery.  I have reviewed the patient's chart and labs.  Questions were answered to the patient's satisfaction.    TEE/DCCV for atrial flutter. NPO. On eliquis. Has been on heparin drip. EF 10% with LVOT VTI 5 cm. BP 100/73. Warm on exam.   Addison Naegeli. Audie Box, [place]  [address] [place], [place] [address] [phone]  2:27 PM

## 2020-02-11 NOTE — Progress Notes (Signed)
Physical Therapy Treatment and Discharge Patient Details Name: Thomas [name] MRN: [id number] DOB: [birth year] Today's Date: 02/11/2020    History of Present Illness  69 y.o. male with history of HTN, prostate cancer and atrial flutter not on [facility] who presented to clinic today with worsening SOB and LE edema found to be in aflutter with RVR HR 120-40s and grossly volume overloaded concerning for acute HF exacerbation. PMH: R hallux amputation last month, L foot ulcer debridement. Admitted 02/02/20 from his cardiologists office for treatment of CHF exacerbation    PT Comments    Pt eager to go home. Ambulating 200 feet with no assistive device without physical difficulty; HR 115-116, spO2 98% on RA. Education provided regarding BLE open chain exercises, use of post op shoes, activity recommendations. Pt with no further questions/concerns. No further acute PT needs; thank you for this consult.     Follow Up Recommendations  No PT follow up;Supervision - Intermittent     Equipment Recommendations  None recommended by PT    Recommendations for Other Services       Precautions / Restrictions Precautions Precautions: Fall Required Braces or Orthoses:  (bilateral post-op shoes) Restrictions Other Position/Activity Restrictions: NWB through forefoot, use post op shoes for ambulation     Mobility  Bed Mobility Overal bed mobility: Independent                Transfers Overall transfer level: Modified independent                  Ambulation/Gait Ambulation/Gait assistance: Modified independent (Device/Increase time) Gait Distance (Feet): 200 Feet Assistive device: None Gait Pattern/deviations: Step-through pattern;Decreased stride length;Antalgic Gait velocity: slowed   General Gait Details: Mild instability due to post op shoe but no overt LOB, steady pace, cues for activity pacing   Stairs             Wheelchair Mobility    Modified Rankin (Stroke  Patients Only)       Balance Overall balance assessment: Mild deficits observed, not formally tested                                          Cognition Arousal/Alertness: Awake/alert Behavior During Therapy: WFL for tasks assessed/performed Overall Cognitive Status: Within Functional Limits for tasks assessed                                        Exercises      General Comments        Pertinent Vitals/Pain Pain Assessment: Faces Faces Pain Scale: Hurts little more Pain Location: BLE's Pain Descriptors / Indicators: Aching Pain Intervention(s): Monitored during session    Home Living                      Prior Function            PT Goals (current goals can now be found in the care plan section) Acute Rehab PT Goals Patient Stated Goal: go home and get back to work PT Goal Formulation: With patient/family Time For Goal Achievement: 02/20/20 Potential to Achieve Goals: Good Progress towards PT goals: Goals met/education completed, patient discharged from PT    Frequency    Min 3X/week      PT Plan Other (comment) (  d/c therapies)    Co-evaluation              AM-PAC PT "6 Clicks" Mobility   Outcome Measure  Help needed turning from your back to your side while in a flat bed without using bedrails?: None Help needed moving from lying on your back to sitting on the side of a flat bed without using bedrails?: None Help needed moving to and from a bed to a chair (including a wheelchair)?: None Help needed standing up from a chair using your arms (e.g., wheelchair or bedside chair)?: None Help needed to walk in hospital room?: None Help needed climbing 3-5 steps with a railing? : None 6 Click Score: 24    End of Session Equipment Utilized During Treatment: Other (comment) (post op shoes) Activity Tolerance: Patient tolerated treatment well Patient left: in bed;with call bell/phone within reach;with  family/visitor present Nurse Communication: Mobility status PT Visit Diagnosis: Other abnormalities of gait and mobility (R26.89)     Time: 3729-0211 PT Time Calculation (min) (ACUTE ONLY): 10 min  Charges:  $Therapeutic Activity: 8-22 mins                     Wyona Almas, PT, DPT Acute Rehabilitation Services Pager [phone] Office [phone]    Deno Etienne 02/11/2020, 1:10 PM

## 2020-02-11 NOTE — Progress Notes (Signed)
Anesthesiology Note:  69 year old male with atrial flutter  underwent TEE with cardioversion. TEE showed severe LV dysfunction with EF 10% and severe RV dysfunction. Patient given 267 mg Propofol for sedation and 400 mcg of neosynephrine for BP support. BP 90-105/50-70 range during the procedure.  In Recovery area in Endoscopy initial BP 59/45. HR 76. Patient awake and oriented X 3, pulse thready, skin clammy. Levophed 4 mcg/min started with improvement in BP to 100-110/60-70 range. Lactate level ordered  i-stat electrolytes OK   Impression: Hypotension following TEE/cardioversion in patient with severe cardiomyopathy.  Plan 1. Support BP with levophed wean as tolerated. 2. Transfer to ICU/stepdown for observation  Roberts Gaudy

## 2020-02-11 NOTE — Anesthesia Preprocedure Evaluation (Addendum)
Anesthesia Evaluation  Patient identified by MRN, date of birth, ID band Patient awake    Reviewed: Allergy & Precautions, NPO status , Patient's Chart, lab work & pertinent test results  Airway Mallampati: II   Neck ROM: Full    Dental  (+) Teeth Intact, Missing, Dental Advisory Given,    Pulmonary    breath sounds clear to auscultation       Cardiovascular hypertension,  Rhythm:Irregular Rate:Normal     Neuro/Psych    GI/Hepatic   Endo/Other    Renal/GU      Musculoskeletal   Abdominal   Peds  Hematology   Anesthesia Other Findings   Reproductive/Obstetrics                            Anesthesia Physical Anesthesia Plan  ASA: III  Anesthesia Plan: MAC   Post-op Pain Management:    Induction: Intravenous  PONV Risk Score and Plan: Propofol infusion  Airway Management Planned: Natural Airway and Nasal Cannula  Additional Equipment:   Intra-op Plan:   Post-operative Plan:   Informed Consent: I have reviewed the patients History and Physical, chart, labs and discussed the procedure including the risks, benefits and alternatives for the proposed anesthesia with the patient or authorized representative who has indicated his/her understanding and acceptance.     Dental advisory given  Plan Discussed with: CRNA and Anesthesiologist  Anesthesia Plan Comments:         Anesthesia Quick Evaluation

## 2020-02-11 NOTE — Progress Notes (Signed)
  Echocardiogram Echocardiogram Transesophageal has been performed.  Thomas [name] 02/11/2020, 3:27 PM

## 2020-02-11 NOTE — Progress Notes (Addendum)
   Patient went for TEE/DCCV this after. DCCV was successful. However, after procedure patient came significantly hypotensive with initial BP of 59/45. Per report, he was awake and alert at the time but cool to the touch and clammy. Dr. Audie Box who performed procedure and Dr. Linna Caprice (Anesthesia) were present and Dr. Johney Frame and I were also called to come see patient. Lactic acid and I-stat were checked and patient was started on Levophed with improvement in BP. Per RN note, he also received Albumin and LR bolus. BP ranging from 100-130's/70's-90's on Levophed prior to leaving recovering room. Patient was transferred to ICU for observation overnight. I stayed with patient until he was transferred to [facility]. He was alert and conversant. Lactic acid came back normal so goal will be to try to wean off Levophed overnight. Dr. Johney Frame updated patient's wife.  Darreld Mclean, PA-C 02/11/2020 5:50 PM

## 2020-02-12 ENCOUNTER — Other Ambulatory Visit: Payer: Self-pay | Admitting: Family Medicine

## 2020-02-12 ENCOUNTER — Encounter (HOSPITAL_COMMUNITY): Payer: Self-pay | Admitting: Cardiology

## 2020-02-12 ENCOUNTER — Encounter: Payer: Medicare Other | Admitting: Podiatry

## 2020-02-12 DIAGNOSIS — I483 Typical atrial flutter: Secondary | ICD-10-CM | POA: Diagnosis not present

## 2020-02-12 DIAGNOSIS — I5021 Acute systolic (congestive) heart failure: Secondary | ICD-10-CM | POA: Diagnosis not present

## 2020-02-12 DIAGNOSIS — I428 Other cardiomyopathies: Secondary | ICD-10-CM

## 2020-02-12 HISTORY — DX: Other cardiomyopathies: I42.8

## 2020-02-12 LAB — POCT I-STAT, CHEM 8
BUN: 18 mg/dL (ref 8–23)
Calcium, Ion: 1.11 mmol/L — ABNORMAL LOW (ref 1.15–1.40)
Chloride: 92 mmol/L — ABNORMAL LOW (ref 98–111)
Creatinine, Ser: 1 mg/dL (ref 0.61–1.24)
Glucose, Bld: 97 mg/dL (ref 70–99)
HCT: 42 % (ref 39.0–52.0)
Hemoglobin: 14.3 g/dL (ref 13.0–17.0)
Potassium: 3.6 mmol/L (ref 3.5–5.1)
Sodium: 139 mmol/L (ref 135–145)
TCO2: 36 mmol/L — ABNORMAL HIGH (ref 22–32)

## 2020-02-12 LAB — BASIC METABOLIC PANEL
Anion gap: 9 (ref 5–15)
BUN: 15 mg/dL (ref 8–23)
CO2: 35 mmol/L — ABNORMAL HIGH (ref 22–32)
Calcium: 8.9 mg/dL (ref 8.9–10.3)
Chloride: 95 mmol/L — ABNORMAL LOW (ref 98–111)
Creatinine, Ser: 0.99 mg/dL (ref 0.61–1.24)
GFR, Estimated: >60 mL/min (ref >60)
Glucose, Bld: 91 mg/dL (ref 70–99)
Potassium: 3.3 mmol/L — ABNORMAL LOW (ref 3.5–5.1)
Sodium: 139 mmol/L (ref 135–145)

## 2020-02-12 LAB — MAGNESIUM: Magnesium: 2 mg/dL (ref 1.7–2.4)

## 2020-02-12 MED ORDER — AMIODARONE HCL 200 MG PO TABS: 200.0000 mg | ORAL_TABLET | Freq: Two times a day (BID) | ORAL | 2 refills | Status: DC

## 2020-02-12 MED ORDER — POTASSIUM CHLORIDE CRYS ER 20 MEQ PO TBCR
40.0000 meq | EXTENDED_RELEASE_TABLET | Freq: Once | ORAL | Status: AC
  Administered 2020-02-12: 40 meq via ORAL
  Filled 2020-02-12: qty 2

## 2020-02-12 MED ORDER — OXYCODONE HCL 5 MG PO TABS
10.0000 mg | ORAL_TABLET | ORAL | Status: DC | PRN
  Administered 2020-02-12 (×2): 10 mg via ORAL
  Filled 2020-02-12 (×2): qty 2

## 2020-02-12 NOTE — Progress Notes (Signed)
Patient arrived to floor by wheelchair. Report given from [facility].

## 2020-02-12 NOTE — Evaluation (Signed)
Clinical/Bedside Swallow Evaluation Patient Details  Name: Thomas [name] MRN: [id number] Date of Birth: Jun 29, 1950  Today's Date: 02/12/2020 Time: SLP Start Time (ACUTE ONLY): 0845 SLP Stop Time (ACUTE ONLY): 0859 SLP Time Calculation (min) (ACUTE ONLY): 14 min  Past Medical History:  Past Medical History:  Diagnosis Date  . Atrial flutter ([place]) 01/2020  . [facility])    prostate  . Claustrophobia    quite severe  . Heart failure with reduced ejection fraction ([place])   . Hypertension   . Hypoglycemia    occ  . Left knee DJD    Xray 12/23/08  . Prostate cancer [facility])    Past Surgical History:  Past Surgical History:  Procedure Laterality Date  . AMPUTATION TOE Right 12/2019  . CYSTOSCOPY  04/11/2018   Procedure: CYSTOSCOPY FLEXIBLE;  Surgeon: [name], MD;  Location: [facility];  Service: Urology;;  NO SEEDS FOUND IN BLADDER  . HERNIA REPAIR  2009   inguinal  . JOINT REPLACEMENT Bilateral 2017   knees  . RADIOACTIVE SEED IMPLANT N/A 04/11/2018   Procedure: RADIOACTIVE SEED IMPLANT/BRACHYTHERAPY IMPLANT;  Surgeon: [name], MD;  Location: [facility];  Service: Urology;  Laterality: N/A;   69     SEEDS IMPLANTED  . RIGHT/LEFT HEART CATH AND CORONARY ANGIOGRAPHY N/A 02/09/2020   Procedure: RIGHT/LEFT HEART CATH AND CORONARY ANGIOGRAPHY;  Surgeon: Nelva Bush, MD;  Location: [facility];  Service: Cardiovascular;  Laterality: N/A;  . SPACE OAR INSTILLATION N/A 04/11/2018   Procedure: SPACE OAR INSTILLATION;  Surgeon: [name], MD;  Location: [facility];  Service: Urology;  Laterality: N/A;  . TOTAL KNEE ARTHROPLASTY Bilateral 09/13/2015   Procedure: BILATERAL KNEE ARTHROPLASTY ;  Surgeon: Paralee Cancel, MD;  Location: [facility];  Service: Orthopedics;  Laterality: Bilateral;   HPI:  Pt is a 69 y.o. male with history of HTN, prostate cancer and atrial flutter who presented with worsening SOB  and LE edema found to be in aflutter with RVR HR 120-40s and grossly volume overloaded concerning for acute HF exacerbation. CXR 10/12: Minimal atelectasis at the left lung base. SLP was consulted on 10/16 due to RN reporting pt having difficulty swallowing pills.   Assessment / Plan / Recommendation Clinical Impression  Pt was seen for bedside swallow evaluation and he denied a history of dysphagia but reported that he has difficulty swallowing the potassium pill and has therefore being dissolving it in water. Oral mechanism exam was [facility]. He tolerated all solids and liquids without signs or symptoms of oropharyngeal dysphagia. A regular texture diet with thin liquids is recommended at this time and further skilled SLP services are not clinically indicated for swallowing.  SLP Visit Diagnosis: Dysphagia, unspecified (R13.10)    Aspiration Risk  No limitations    Diet Recommendation Regular;Thin liquid   Liquid Administration via: Cup;Straw Medication Administration: Whole meds with liquid (dissolve/crush larger pills like potassium) Supervision: Patient able to self feed    Other  Recommendations Oral Care Recommendations: Oral care BID   Follow up Recommendations None      Frequency and Duration            Prognosis        Swallow Study   General Date of Onset: 02/09/20 HPI: Pt is a 69 y.o. male with history of HTN, prostate cancer and atrial flutter who presented with worsening SOB and LE edema found to be in aflutter with RVR HR 120-40s  and grossly volume overloaded concerning for acute HF exacerbation. CXR 10/12: Minimal atelectasis at the left lung base. SLP was consulted on 10/16 due to RN reporting pt having difficulty swallowing pills. Type of Study: Bedside Swallow Evaluation Previous Swallow Assessment: None Diet Prior to this Study: Regular;Thin liquids Temperature Spikes Noted: No Respiratory Status: Room air History of Recent Intubation: No Behavior/Cognition:  Alert;Cooperative;Pleasant mood Oral Cavity Assessment: Within Functional Limits Oral Care Completed by SLP: No Oral Cavity - Dentition: Adequate natural dentition;Missing dentition Vision: Functional for self-feeding Self-Feeding Abilities: Able to feed self Patient Positioning: Upright in bed;Postural control adequate for testing Baseline Vocal Quality: Normal Volitional Cough: Strong Volitional Swallow: Able to elicit    Oral/Motor/Sensory Function Overall Oral Motor/Sensory Function: Within functional limits   Ice Chips Ice chips: Not tested   Thin Liquid Thin Liquid: Within functional limits Presentation: Straw    Nectar Thick Nectar Thick Liquid: Not tested   Honey Thick Honey Thick Liquid: Not tested   Puree Puree: Within functional limits Presentation: Spoon   Solid     Solid: Within functional limits Presentation: [place]. Hardin Negus, [place], [place] Office number [phone] Pager [phone]  Horton Marshall 02/12/2020,9:10 AM

## 2020-02-12 NOTE — Progress Notes (Signed)
Report called to [address]. Patient transferred via wheelchair.

## 2020-02-12 NOTE — Progress Notes (Signed)
Progress Note  Patient Name: Thomas [name] Date of Encounter: 02/12/2020  [facility] Cardiologist: Freada Bergeron, MD   Subjective   Feels significantly improved and the best he has felt since being in the hospital. Awake, alert and eating breakfast. Off pressors. Hoping to go home today.  Patient underwent TEE/DCCV yesterday with successful return to NSR. Shortly after the procedure, the patient became more somnolent and hypotensive. Likely multifactorial in the setting of low EF, poor cardiac reserve and sedation administration. Started on levophed with improvement and transferred to ICU overnight.  K persistently low despite aggressive repletion. Net negative 200cc on decreased dose of torsemide. Blood pressures soft this AM 80/60s.  Inpatient Medications    Scheduled Meds: . acetaminophen  1,000 mg Oral TID  . amiodarone  200 mg Oral BID  . apixaban  5 mg Oral BID  . atorvastatin  40 mg Oral Daily  . Chlorhexidine Gluconate Cloth  6 each Topical Daily  . collagenase   Topical Daily  . diclofenac Sodium  2 g Topical BID  . DULoxetine  60 mg Oral Daily  . polyethylene glycol  17 g Oral BID  . potassium chloride  40 mEq Oral BID  . potassium chloride  40 mEq Oral Once  . senna  1 tablet Oral Daily  . sodium chloride flush  3 mL Intravenous Q12H  . sodium chloride flush  3 mL Intravenous Q12H  . sodium chloride flush  3 mL Intravenous Q12H  . torsemide  20 mg Oral BID   Continuous Infusions: . sodium chloride 10 mL/hr at 02/12/20 0400  . sodium chloride    . norepinephrine (LEVOPHED) Adult infusion Stopped (02/11/20 2010)  . ondansetron (ZOFRAN) IV     PRN Meds: sodium chloride, sodium chloride, ondansetron (ZOFRAN) IV, oxyCODONE **AND** [DISCONTINUED] acetaminophen, sodium chloride flush, sodium chloride flush   Vital Signs    Vitals:   02/12/20 0300 02/12/20 0400 02/12/20 0500 02/12/20 0600  BP: (!) 83/71 113/67 117/77 92/69  Pulse:      Resp: 18  (!) 31 (!) 22 14  Temp:  98.4 F (36.9 C)    TempSrc:      SpO2: 97% 98% 98% 97%  Weight:      Height:        Intake/Output Summary (Last 24 hours) at 02/12/2020 0727 Last data filed at 02/12/2020 0400 Gross per 24 hour  Intake 745.85 ml  Output 950 ml  Net -204.15 ml   Last 3 Weights 02/11/2020 02/11/2020 02/10/2020  Weight (lbs) 200 lb 185 lb 13.6 oz 189 lb 6 oz  Weight (kg) 90.719 kg 84.3 kg 85.9 kg      Telemetry    NSR - Personally Reviewed  ECG    NSR with PACs, LVH with strain-like pattern, LAD- Personally Reviewed  Physical Exam   GEN: Sitting up in bed, alert, comfortable Neck: No JVD Cardiac: Regular rate, no murmurs Respiratory: CTAB GI: Soft, nontender, non-distended  MS: Trace-1+ pitting edema on the shins Neuro:  Nonfocal  Psych: Normal affect   Labs    High Sensitivity Troponin:   Recent Labs  Lab 02/04/20 0948 02/04/20 1112  TROPONINIHS 18* 23*      Chemistry Recent Labs  Lab 02/10/20 0005 02/10/20 0005 02/11/20 0341 02/11/20 1621 02/12/20 0130  NA 136   < > 138 139 139  K 3.1*   < > 3.3* 3.6 3.3*  CL 93*   < > 93* 92* 95*  CO2 32  --  35*  --  35*  GLUCOSE 148*   < > 102* 97 91  BUN 16   < > 15 18 15   CREATININE 0.95   < > 0.99 1.00 0.99  CALCIUM 8.5*  --  8.9  --  8.9  GFRNONAA >60  --  >60  --  >60  ANIONGAP 11  --  10  --  9   < > = values in this interval not displayed.     Hematology Recent Labs  Lab 02/09/20 0104 02/09/20 1708 02/10/20 0005 02/11/20 0341 02/11/20 1621  WBC 6.5  --  5.8 6.4  --   RBC 4.99  --  5.07 5.16  --   HGB 12.7*   < > 12.6* 12.8* 14.3  HCT 40.9   < > 40.7 42.4 42.0  MCV 82.0  --  80.3 82.2  --   MCH 25.5*  --  24.9* 24.8*  --   MCHC 31.1  --  31.0 30.2  --   RDW 15.9*  --  15.7* 15.9*  --   PLT 306  --  265 256  --    < > = values in this interval not displayed.    BNPNo results for input(s): BNP, PROBNP in the last 168 hours.   DDimer No results for input(s): DDIMER in the last  168 hours.   Radiology    ECHO TEE  Result Date: 02/11/2020    TRANSESOPHOGEAL ECHO REPORT   Patient Name:   Thomas [name] Date of Exam: 02/11/2020 Medical Rec #:  [id number]       Height:       80.0 in Accession #:    [id number]      Weight:       185.8 lb Date of Birth:  Mar 08, 1951       BSA:          2.229 m Patient Age:    69 years        BP:           106/79 mmHg Patient Gender: M               HR:           114 bpm. Exam Location:  Inpatient Procedure: 3D Echo, Transesophageal Echo, Cardiac Doppler and Color Doppler Indications:     I48.92* Unspecified atrial flutter  History:         Patient has prior history of Echocardiogram examinations, most                  recent 02/02/2020. Abnormal ECG, Arrythmias:Atrial Flutter,                  Signs/Symptoms:Dyspnea; Risk Factors:Hypertension. Cancer.                  Edema.  Sonographer:     Roseanna Rainbow RDCS Referring Phys:  [id number] [place] Diagnosing Phys: Eleonore Chiquito MD PROCEDURE: After discussion of the risks and benefits of a TEE, an informed consent was obtained from the patient. TEE procedure time was 18 minutes. The transesophogeal probe was passed without difficulty through the esophogus of the patient. Imaged were obtained with the patient in a left lateral decubitus position. Local oropharyngeal anesthetic was provided with Cetacaine. Sedation performed by different physician. The patient was monitored while under deep sedation. Anesthestetic sedation was provided intravenously by Anesthesiology: 260mg  of Propofol, 40mg  of Lidocaine. The patient's vital signs; including heart rate, blood  pressure, and oxygen saturation; remained stable throughout the procedure. The patient developed no complications during the procedure. A successful direct current cardioversion was performed at 120 joules with 1 attempt. IMPRESSIONS  1. Severely reduced LV function, LVEF~10-15%. LVOT VTI ~5.5 cm. Left ventricular ejection fraction, by estimation, is  10-15%. The left ventricle has severely decreased function. The left ventricle demonstrates global hypokinesis. The left ventricular internal cavity size was moderately to severely dilated.  2. Evidence of atrial level shunting detected by color flow Doppler. Agitated saline contrast bubble study was positive with shunting observed within 3-6 cardiac cycles suggestive of interatrial shunt. There is a small patent foramen ovale with predominantly left to right shunting across the atrial septum.  3. Right ventricular systolic function is severely reduced. The right ventricular size is severely enlarged.  4. Left atrial size was mild to moderately dilated. No left atrial/left atrial appendage thrombus was detected. The LAA emptying velocity was 21 cm/s.  5. Right atrial size was mildly dilated.  6. The mitral valve is grossly normal. Mild mitral valve regurgitation. No evidence of mitral stenosis.  7. Tricuspid valve regurgitation is mild to moderate.  8. The aortic valve is tricuspid. Aortic valve regurgitation is trivial. No aortic stenosis is present. FINDINGS  Left Ventricle: Severely reduced LV function, LVEF~10-15%. LVOT VTI ~5.5 cm. Left ventricular ejection fraction, by estimation, is 10-15%. The left ventricle has severely decreased function. The left ventricle demonstrates global hypokinesis. The left ventricular internal cavity size was moderately to severely dilated. Right Ventricle: The right ventricular size is severely enlarged. No increase in right ventricular wall thickness. Right ventricular systolic function is severely reduced. Left Atrium: Left atrial size was mild to moderately dilated. No left atrial/left atrial appendage thrombus was detected. The LAA emptying velocity was 21 cm/s. Right Atrium: Right atrial size was mildly dilated. Pericardium: There is no evidence of pericardial effusion. Mitral Valve: The mitral valve is grossly normal. Mild mitral valve regurgitation. No evidence of mitral  valve stenosis. Tricuspid Valve: The tricuspid valve is grossly normal. Tricuspid valve regurgitation is mild to moderate. No evidence of tricuspid stenosis. Aortic Valve: The aortic valve is tricuspid. Aortic valve regurgitation is trivial. No aortic stenosis is present. Pulmonic Valve: The pulmonic valve was grossly normal. Pulmonic valve regurgitation is trivial. No evidence of pulmonic stenosis. Aorta: The aortic root and ascending aorta are structurally normal, with no evidence of dilitation. There is minimal (Grade I) layered plaque involving the descending aorta. Venous: The left upper pulmonary vein is normal. IAS/Shunts: Evidence of atrial level shunting detected by color flow Doppler. Agitated saline contrast was given intravenously to evaluate for intracardiac shunting. Agitated saline contrast bubble study was positive with shunting observed within 3-6 cardiac cycles suggestive of interatrial shunt. A small patent foramen ovale is detected with predominantly left to right shunting across the atrial septum.  LEFT VENTRICLE PLAX 2D LVOT diam:     2.30 cm LV SV:         23 LV SV Index:   10 LVOT Area:     4.15 cm  AORTIC VALVE LVOT Vmax:   44.88 cm/s LVOT Vmean:  32.166 cm/s LVOT VTI:    0.056 m  AORTA Ao Root diam: 3.90 cm Ao Asc diam:  3.30 cm TRICUSPID VALVE TR Peak grad:   24.6 mmHg TR Vmax:        248.00 cm/s  SHUNTS Systemic VTI:  0.06 m Systemic Diam: 2.30 cm Eleonore Chiquito MD Electronically signed by Eleonore Chiquito MD  Signature Date/Time: 02/11/2020/6:55:02 PM    Final     Cardiac Studies   Echo personally reviewed EF 15% 02/02/20 1. While there is no visualized LV thrombus on sweeps of the apex,  patient did not have IV access, unable to use echo contrast for  confirmation. Left ventricular ejection fraction, by estimation, is <20%.  The left ventricle has severely decreased  function. The left ventricle demonstrates global hypokinesis. The left  ventricular internal cavity size was  moderately dilated. Left ventricular  diastolic function could not be evaluated.  2. Right ventricular systolic function is normal. The right ventricular  size is mildly enlarged. There is mildly elevated pulmonary artery  systolic pressure.  3. Left atrial size was severely dilated.  4. Right atrial size was severely dilated.  5. Moderate pleural effusion in both left and right lateral regions.  6. The mitral valve is normal in structure. Mild mitral valve  regurgitation.  7. The aortic valve is tricuspid. There is mild calcification of the  aortic valve. Aortic valve regurgitation is not visualized. Mild aortic  valve sclerosis is present, with no evidence of aortic valve stenosis.  8. The inferior vena cava is dilated in size with <50% respiratory  variability, suggesting right atrial pressure of 15 mmHg.   R/LHC 02/09/20: Conclusions: 1. No angiographically significant coronary artery disease. 2. Upper normal left heart, right heart, and pulmonary artery pressures. 3. Mildly reduced Fick cardiac output/index.  Recommendations: 1. Restart heparin infusion 2 hours after TR band removal.  If there is no evidence of bleeding/vascular complication, consider transitioning back to apixaban as soon as tomorrow. 2. Proceed with TEE/cardioversion as soon as tomorrow.  I will make Mr. Douthit NPO after midnight in anticipation of this. 3. Optimize evidence-based heart failure therapy for non-ischemic cardiomyopathy. 4. Primary prevention of coronary artery disease.  Right Heart Pressures RA (mean): 6 mmHg RV (S/EDP): 30/6 mmHg PA (S/D, mean): 35/18 (24) mmHg PCWP (mean): 10-15 mmHg  Ao sat: 93% PA sat: 58%  Fick CO: 5.0 L/min Fick CI: 2.2 L/min/m^2      Patient Profile     69 y.o. 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 Now improving with diuresis. No obstructive CAD on cath. Plan for  TEE and possible DCCV today.  Assessment & Plan    #Hypotension: Following successful TEE/DCCV, the patient became more somnolent and hypotensive requiring initiation of pressor support and placement of NRB. Suspect acute decompensation multifactorial in the setting of poor cardiac reserve, low EF and administration of sedation for the procedure. Transferred to ICU overnight but rapidly improved and weaned off pressors. Feels great this morning. -Transient after TEE now significantly improved and off pressors -BP stable this morning -Will not start any blood pressure medications on discharge and will follow-up closely in clinic with me  #Aflutter: -S/p TEE with successful DCCV on 97/02/63 complicated by hypotension as detailed above; now improved and back to baseline -Remains in rhythm this AM -Will try to maintain NSR with amiodarone 200mg  BID. AAD options limited due to EF<20% and hypotension -Continue apixaban 5mg  BID -Will arrange for follow-up in Afib clinic  #Acute systolic heart failure: TTE with LVEF <20% in the setting of Aflutter with RVR. Clean coronaries on cath. RHC numbers with normal filling pressures. -No obstructive CAD on cath -RHC with RAP 6, RV 30/6, PA 35/18, PCWP 10-15 consistent with normal filling pressures -Relatively net even on torsemide 20mg  BID-->will continue same -  Unfortunately, blood pressure is limiting our ability to add GDMT; will need to continue to add as out-patient  #Hypokalemia: -Requiring frequent supplementation -Will need at least 63mEq BID on discharge with repeat labs 2 days after discharge to adjust accordingly-->hopefully will stabilize now that we are aiming for net even and not actively diuresing  #Chronic Pain: -IM following, appreciate recommendations  Okay to discharge home today on current medications including: Amiodarone 200mg  BID Apxiban 5mg  BID Potassium 6mEq BID Pain medications per family medicine  Repeat BMET on 10/25  to monitor renal function and electrolytes Needs to monitor daily weights at home Low Na diet Afib clinic in 1 week Follow-up with me or PA in clinic in [place], [place]   For questions or updates, please contact [place] Please consult [email or web address] for contact info under        Signed, Freada Bergeron, MD  02/12/2020, 7:27 AM

## 2020-02-12 NOTE — Progress Notes (Signed)
Family Medicine Teaching Service Daily Progress Note Intern Pager: [phone]  Patient name: Thomas [name] Medical record number: [id number] Date of birth: May 09, 1950 Age: 69 y.o. Gender: male  Primary Care Provider: [name], MD Consultants: Cardiology Code Status: Full  Pt Overview and Major Events to Date:  10/11 Admitted 10/18 Left/Right Heart Cath 10/20 TEE/Cardioversion  Assessment and Plan:  Thomas [name] is a69 yr oldmale who presentswith SOBdue to acute systolic HF exacerbation and a flutter, PMHxsignificant for rheumatoid arthritis, atrial flutter, newly diagnosed acute systolicHR with reduced EF, and recentGreat toe amputation and left second metatarsal osteotomy   Polyarthropathy Chronic Pain  Uncontrolled Right Foot Pain: Chronic pain in multiple joints due to osteoarthritisand recentsurgery. Home medication is Percocet 10-325 q4 PRN. Previously able to do ADL's but recently unable to. Podiatry saw patient10/14and recommend cleaningroutine and will follow up outpatient.He reports that -ContinueOxycodone 10 mg q4h while awake (home dose) -Scheduled Tylenol1000 mg TID -ContinueCymbalta 60 mg daily   A flutter RVR: CHA2DS2-VASc score is 3 (age, HTN, CHF) Mag is 2.0. Cardiology is managing -Continue Metoprolol 25 mg TID -Continue Eliquis 5 mg BID -Kdur 40 mEq daily ordered -Per cards (10/12)Keep K>4 and Mag >2   Acute systolic CHF/ Cardiomyopathy with EF<20% Cardiology is managing    FEN/GI: Heart Healthy PPx: SCD  Disposition: Cardiac tele  Prior to Admission Living Arrangement: Home Anticipated Discharge Location: Home Barriers to Discharge: Cardiac Management Anticipated discharge in approximately 0-3 day(s).   Subjective:  Interviewed patient at bedside.  He reports that his pain is controlled. He has no complaints at this time.   Objective: Temp:  [97.3 F (36.3 C)-98.4 F (36.9 C)] 97.7 F (36.5 C)  (10/21 0000) Pulse Rate:  [27-118] 46 (10/20 1649) Resp:  [7-31] 22 (10/21 0500) BP: (68-156)/(47-125) 117/77 (10/21 0500) SpO2:  [86 %-100 %] 98 % (10/21 0500) Weight:  [200 lb (90.7 kg)] 200 lb (90.7 kg) (10/20 1350) Physical Exam:  Physical Exam Vitals and nursing note reviewed.  Constitutional:      General: He is not in acute distress.    Appearance: Normal appearance. He is normal weight. He is not ill-appearing, toxic-appearing or diaphoretic.  Cardiovascular:     Rate and Rhythm: Normal rate and regular rhythm.     Pulses: Normal pulses.     Heart sounds: Normal heart sounds. No murmur heard.   Pulmonary:     Effort: Pulmonary effort is normal. No respiratory distress.     Breath sounds: Normal breath sounds. No wheezing.  Abdominal:     General: There is no distension.     Palpations: There is no mass.     Tenderness: There is no abdominal tenderness.  Musculoskeletal:     Right lower leg: Edema present.     Left lower leg: Edema present.     Comments: Bilateral 1+ pitting edema extending up to mid tibia  Neurological:     Mental Status: He is alert. Mental status is at baseline.  Psychiatric:        Mood and Affect: Mood normal.        Behavior: Behavior normal.        Thought Content: Thought content normal.      Laboratory: Recent Labs  Lab 02/09/20 0104 02/09/20 1708 02/09/20 1717 02/10/20 0005 02/11/20 0341  WBC 6.5  --   --  5.8 6.4  HGB 12.7*   < > 13.9 12.6* 12.8*  HCT 40.9   < > 41.0 40.7 42.4  PLT 306  --   --  265 256   < > = values in this interval not displayed.   Recent Labs  Lab 02/10/20 0005 02/11/20 0341 02/12/20 0130  NA 136 138 139  K 3.1* 3.3* 3.3*  CL 93* 93* 95*  CO2 32 35* 35*  BUN 16 15 15   CREATININE 0.95 0.99 0.99  CALCIUM 8.5* 8.9 8.9  GLUCOSE 148* 102* 91    Mag: 2.0  Imaging/Diagnostic Tests:  Right/Left Heart Cath: No significant coronary artery disease.   DG Toe Great Right:Prior 1st toe amputation  with soft tissue swelling at the site. Underlying 1st metatarsal head appears stable since September. No strong evidence of osteomyelitis. Calcified peripheral vascular disease.   Thomas Cedar, MD 02/12/2020, 5:49 AM PGY-1, [place] Intern pager: [phone], text pages welcome

## 2020-02-12 NOTE — Discharge Summary (Addendum)
Discharge Summary    Patient ID: Thomas [name] MRN: [id number]; DOB: 1950/11/27  Admit date: 02/02/2020 Discharge date: 02/12/2020  Primary Care Provider: [name], MD  Primary Cardiologist: Freada Bergeron, MD  Primary Electrophysiologist:  None   Discharge Diagnoses    Principal Problem:   Acute systolic heart failure [facility]) Active Problems:   Atrial flutter ([place])   Rheumatoid arthritis ([place])   Right foot pain   Shortness of breath   NICM (nonischemic cardiomyopathy) ([place]), no significant CAD on cardiac cath      Diagnostic Studies/Procedures    Echo 02/02/20 IMPRESSIONS    1. While there is no visualized LV thrombus on sweeps of the apex,  patient did not have IV access, unable to use echo contrast for  confirmation. Left ventricular ejection fraction, by estimation, is <20%.  The left ventricle has severely decreased  function. The left ventricle demonstrates global hypokinesis. The left  ventricular internal cavity size was moderately dilated. Left ventricular  diastolic function could not be evaluated.  2. Right ventricular systolic function is normal. The right ventricular  size is mildly enlarged. There is mildly elevated pulmonary artery  systolic pressure.  3. Left atrial size was severely dilated.  4. Right atrial size was severely dilated.  5. Moderate pleural effusion in both left and right lateral regions.  6. The mitral valve is normal in structure. Mild mitral valve  regurgitation.  7. The aortic valve is tricuspid. There is mild calcification of the  aortic valve. Aortic valve regurgitation is not visualized. Mild aortic  valve sclerosis is present, with no evidence of aortic valve stenosis.  8. The inferior vena cava is dilated in size with <50% respiratory  variability, suggesting right atrial pressure of 15 mmHg.   Conclusion(s)/Recommendation(s): Patient in atrial flutter with RVR during  study. Discussed severely  reduced EF with Dr. Gasper Sells, cardiologist  on call.   FINDINGS  Left Ventricle: While there is no visualized LV thrombus on sweeps of the  apex, patient did not have IV access, unable to use echo contrast for  confirmation. Left ventricular ejection fraction, by estimation, is <20%.  The left ventricle has severely  decreased function. The left ventricle demonstrates global hypokinesis.  The left ventricular internal cavity size was moderately dilated. There is  borderline left ventricular hypertrophy. Left ventricular diastolic  function could not be evaluated.   Right Ventricle: The right ventricular size is mildly enlarged. Right  vetricular wall thickness was not well visualized. Right ventricular  systolic function is normal. There is mildly elevated pulmonary artery  systolic pressure. The tricuspid regurgitant  velocity is 2.71 m/s, and with an assumed right atrial pressure of 15  mmHg, the estimated right ventricular systolic pressure is 54.0 mmHg.   Left Atrium: Left atrial size was severely dilated.   Right Atrium: Right atrial size was severely dilated.   Pericardium: Trivial pericardial effusion is present.   Mitral Valve: The mitral valve is normal in structure. Mild mitral valve  regurgitation.   Tricuspid Valve: The tricuspid valve is normal in structure. Tricuspid  valve regurgitation is mild.   Aortic Valve: The aortic valve is tricuspid. There is mild calcification  of the aortic valve. Aortic valve regurgitation is not visualized. Mild  aortic valve sclerosis is present, with no evidence of aortic valve  stenosis.   Pulmonic Valve: The pulmonic valve was not well visualized. Pulmonic valve  regurgitation is not visualized. No evidence of pulmonic stenosis.   Aorta: The aortic  root, ascending aorta and aortic arch are all  structurally normal, with no evidence of dilitation or obstruction.   Venous: The inferior vena cava is dilated in size with  less than 50%  respiratory variability, suggesting right atrial pressure of 15 mmHg.   IAS/Shunts: The atrial septum is grossly normal.   Additional Comments: There is a moderate pleural effusion in both left and  right lateral regions.    Cardiac cath 02/09/20 Conclusions: 1. No angiographically significant coronary artery disease. 2. Upper normal left heart, right heart, and pulmonary artery pressures. 3. Mildly reduced Fick cardiac output/index.  Recommendations: 1. Restart heparin infusion 2 hours after TR band removal.  If there is no evidence of bleeding/vascular complication, consider transitioning back to apixaban as soon as tomorrow. 2. Proceed with TEE/cardioversion as soon as tomorrow.  I will make Thomas [name] NPO after midnight in anticipation of this. 3. Optimize evidence-based heart failure therapy for non-ischemic cardiomyopathy. 4. Primary prevention of coronary artery disease.  Echo TEE DCCV 02/11/20 IMPRESSIONS    1. Severely reduced LV function, LVEF~10-15%. LVOT VTI ~5.5 cm. Left  ventricular ejection fraction, by estimation, is 10-15%. The left  ventricle has severely decreased function. The left ventricle demonstrates  global hypokinesis. The left ventricular  internal cavity size was moderately to severely dilated.  2. Evidence of atrial level shunting detected by color flow Doppler.  Agitated saline contrast bubble study was positive with shunting observed  within 3-6 cardiac cycles suggestive of interatrial shunt. There is a  small patent foramen ovale with  predominantly left to right shunting across the atrial septum.  3. Right ventricular systolic function is severely reduced. The right  ventricular size is severely enlarged.  4. Left atrial size was mild to moderately dilated. No left atrial/left  atrial appendage thrombus was detected. The LAA emptying velocity was 21  cm/s.  5. Right atrial size was mildly dilated.  6. The mitral valve  is grossly normal. Mild mitral valve regurgitation.  No evidence of mitral stenosis.  7. Tricuspid valve regurgitation is mild to moderate.  8. The aortic valve is tricuspid. Aortic valve regurgitation is trivial.  No aortic stenosis is present.   FINDINGS  Left Ventricle: Severely reduced LV function, LVEF~10-15%. LVOT VTI ~5.5  cm. Left ventricular ejection fraction, by estimation, is 10-15%. The left  ventricle has severely decreased function. The left ventricle demonstrates  global hypokinesis. The left  ventricular internal cavity size was moderately to severely dilated.   Right Ventricle: The right ventricular size is severely enlarged. No  increase in right ventricular wall thickness. Right ventricular systolic  function is severely reduced.   Left Atrium: Left atrial size was mild to moderately dilated. No left  atrial/left atrial appendage thrombus was detected. The LAA emptying  velocity was 21 cm/s.   Right Atrium: Right atrial size was mildly dilated.   Pericardium: There is no evidence of pericardial effusion.   Mitral Valve: The mitral valve is grossly normal. Mild mitral valve  regurgitation. No evidence of mitral valve stenosis.   Tricuspid Valve: The tricuspid valve is grossly normal. Tricuspid valve  regurgitation is mild to moderate. No evidence of tricuspid stenosis.   Aortic Valve: The aortic valve is tricuspid. Aortic valve regurgitation is  trivial. No aortic stenosis is present.   Pulmonic Valve: The pulmonic valve was grossly normal. Pulmonic valve  regurgitation is trivial. No evidence of pulmonic stenosis.   Aorta: The aortic root and ascending aorta are structurally normal, with  no  evidence of dilitation. There is minimal (Grade I) layered plaque  involving the descending aorta.   Venous: The left upper pulmonary vein is normal.   IAS/Shunts: Evidence of atrial level shunting detected by color flow  Doppler. Agitated saline contrast was  given intravenously to evaluate for  intracardiac shunting. Agitated saline contrast bubble study was positive  with shunting observed within 3-6  cardiac cycles suggestive of interatrial shunt. A small patent foramen  ovale is detected with predominantly left to right shunting across the  atrial septum.    LVEF 10%.  2. No LAA thrombus.  3. Small PFO with L to R shunting. 4. Severe RV failure.  5. Full Report to follow.    DCCV with one shock 120 J to SR.   _____________   History of Present Illness     Thomas [name] is a 69 y.o. male with hx HTN, prostate cancer and atrial flutter who was admitted 02/02/20 after presenting with SOB and LE edema, and in a flutter HR 120-140s and acute CHF.   Was originally diagnosed with a flutter in ER 11/26/19 with RVR but left AMA on dilt never had follow up with cards.   Was not placed on AC due to concern for imbalance but no falls.   Pt was placed on Heparin and given IV lasix for CHF. Echo ordered.     Labs: BNP 909, Cr 0.77, K 4.0, AST 23, ALT 20, Mg 1.9 hgb 11.9  TSH 5.337, A1C 6.2.  COVID neg.  EKG demonstrated a flutter at 125 no acute ST changes.    Hospital Course     Consultants: FP for pain management, wound care for Rt great toe amputation site. left second metatarsal osteotomy as well  Podiatry with Dr. Hilaria Ota saw as well. PT eval and treatment along with OT    Pt admitted and placed on lasix infusion.  Echo with EF 15% mod pl effusion, mild MR,  With this cardiac cath planned once he diuresed.    Cardiac cath 02/09/20 with no significant CAD.upper normal Lt heart, rt heatr and pulmonary artery pressures, milly reduced Fick cardiac output/index.     He had TEE DCCV on 02/11/20 that was successful.  He did develop hypotension post procedure and placed on levophed drip.      Hypotension -The drip was weaned overnight.  Today he feels much better.  No chest pain no SOB.  His K+ is low so replaced and will leave on 40 BID and recheck  Monday.    Acute CHF/NICM may be due to tachycardia wt on admit 101.288 Kg and now at discharge 90.7 kg  Is neg 13,521 since admit.  Discharged on torsemide 20 mg BID   Afib/flutter maintaining SR post TEE/ DCCV on amiodarone 200 mg BID ,  eliquis 5 mg BID. CHA2DS2VASc of 3      Polyarthropathy Chronic Pain  Uncontrolled Right Foot Pain  Home medication is percocet 10-325 every 4 hours prn.  Continue oxycodone cymbalta 60 mg daily.   DG Toe Great Right:Prior 1st toe amputation with soft tissue swelling at the site. Underlying 1st metatarsal head appears stable since September. No strong evidence of osteomyelitis. Calcified peripheral vascular disease.  Having dressing changes he will follow up with podiatry as outpt.  Pt seen and evaluated by Dr. Johney Frame and found stable for discharge.    Did the patient have an acute coronary syndrome (MI, NSTEMI, STEMI, etc) this admission?:  No  Did the patient have a percutaneous coronary intervention (stent / angioplasty)?:  No.   _____________  Discharge Vitals Blood pressure 103/78, pulse 82, temperature 98.6 F (37 C), temperature source Oral, resp. rate 18, height _0  (2.032 m), weight 90.7 kg, SpO2 100 %.  Filed Weights   02/11/20 0345 02/11/20 1350 02/12/20 0750  Weight: 84.3 kg 90.7 kg 90.7 kg    Labs & Radiologic Studies    CBC Recent Labs    02/10/20 0005 02/10/20 0005 02/11/20 0341 02/11/20 1621  WBC 5.8  --  6.4  --   HGB 12.6*   < > 12.8* 14.3  HCT 40.7   < > 42.4 42.0  MCV 80.3  --  82.2  --   PLT 265  --  256  --    < > = values in this interval not displayed.   Basic Metabolic Panel Recent Labs    02/11/20 0341 02/11/20 0341 02/11/20 1621 02/12/20 0130  NA 138   < > 139 139  K 3.3*   < > 3.6 3.3*  CL 93*   < > 92* 95*  CO2 35*  --   --  35*  GLUCOSE 102*   < > 97 91  BUN 15   < > 18 15  CREATININE 0.99   < > 1.00 0.99  CALCIUM 8.9  --   --  8.9  MG 1.7  --   --  2.0   <  > = values in this interval not displayed.   Liver Function Tests No results for input(s): AST, ALT, ALKPHOS, BILITOT, PROT, ALBUMIN in the last 72 hours. No results for input(s): LIPASE, AMYLASE in the last 72 hours. High Sensitivity Troponin:   Recent Labs  Lab 02/04/20 0948 02/04/20 1112  TROPONINIHS 18* 23*    BNP Invalid input(s): POCBNP D-Dimer No results for input(s): DDIMER in the last 72 hours. Hemoglobin A1C No results for input(s): HGBA1C in the last 72 hours. Fasting Lipid Panel No results for input(s): CHOL, HDL, LDLCALC, TRIG, CHOLHDL, LDLDIRECT in the last 72 hours. Thyroid Function Tests No results for input(s): TSH, T4TOTAL, T3FREE, THYROIDAB in the last 72 hours.  Invalid input(s): FREET3 _____________  CARDIAC CATHETERIZATION  Result Date: 02/09/2020 Conclusions: 1. No angiographically significant coronary artery disease. 2. Upper normal left heart, right heart, and pulmonary artery pressures. 3. Mildly reduced Fick cardiac output/index. Recommendations: 1. Restart heparin infusion 2 hours after TR band removal.  If there is no evidence of bleeding/vascular complication, consider transitioning back to apixaban as soon as tomorrow. 2. Proceed with TEE/cardioversion as soon as tomorrow.  I will make Thomas [name] NPO after midnight in anticipation of this. 3. Optimize evidence-based heart failure therapy for non-ischemic cardiomyopathy. 4. Primary prevention of coronary artery disease. Nelva Bush, MD [facility]   DG CHEST PORT 1 VIEW  Result Date: 02/03/2020 CLINICAL DATA:  Shortness of breath EXAM: PORTABLE CHEST 1 VIEW COMPARISON:  11/18/2019 FINDINGS: Minimal atelectasis at the left lung base. Chronic mild interstitial prominence. No pleural effusion pneumothorax. Similar cardiomediastinal contours. Heart size is likely normal portable technique. IMPRESSION: Minimal atelectasis at the left lung base. Electronically Signed   By: Macy Mis M.D.   On:  02/03/2020 08:07   DG Foot Complete Left  Result Date: 01/14/2020 Please see detailed radiograph report in office note.  DG Foot Complete Right  Result Date: 01/14/2020 Please see detailed radiograph report in office note.  DG Toe Great Right  Result Date: 02/04/2020  CLINICAL DATA:  69 year old male with pain. EXAM: RIGHT GREAT TOE COMPARISON:  Right foot series 01/14/2020. FINDINGS: Soft tissue swelling at the prior 1st toe amputation site. The underlying metatarsal head appears stable since 01/14/2020, arguing against active osteomyelitis despite a persistent area of mild cortical irregularity and lucency. No new cortical irregularity. Extensive calcified peripheral vascular disease. Other visible osseous structures appear intact. IMPRESSION: 1. Prior 1st toe amputation with soft tissue swelling at the site. Underlying 1st metatarsal head appears stable since September. No strong evidence of osteomyelitis. 2. Calcified peripheral vascular disease. Electronically Signed   By: Genevie Ann M.D.   On: 02/04/2020 17:35   ECHOCARDIOGRAM COMPLETE  Result Date: 02/02/2020    ECHOCARDIOGRAM REPORT   Patient Name:   Thomas [name] Date of Exam: 02/02/2020 Medical Rec #:  [id number]       Height:       76.0 in Accession #:    [id number]      Weight:       220.0 lb Date of Birth:  29-Jul-1950       BSA:          2.307 m Patient Age:    23 years        BP:           129/62 mmHg Patient Gender: M               HR:           124 bpm. Exam Location:  Inpatient Procedure: 2D Echo, Cardiac Doppler and Color Doppler Indications:    Cardiomyopathy-Unspecified  History:        Patient has no prior history of Echocardiogram examinations.                 Arrythmias:Atrial Flutter, Signs/Symptoms:Shortness of Breath;                 Risk Factors:Hypertension.  Sonographer:    Clayton Lefort RDCS (AE) Referring Phys: [id number] [facility] [place] IMPRESSIONS  1. While there is no visualized LV thrombus on sweeps of the apex,  patient did not have IV access, unable to use echo contrast for confirmation. Left ventricular ejection fraction, by estimation, is <20%. The left ventricle has severely decreased function. The left ventricle demonstrates global hypokinesis. The left ventricular internal cavity size was moderately dilated. Left ventricular diastolic function could not be evaluated.  2. Right ventricular systolic function is normal. The right ventricular size is mildly enlarged. There is mildly elevated pulmonary artery systolic pressure.  3. Left atrial size was severely dilated.  4. Right atrial size was severely dilated.  5. Moderate pleural effusion in both left and right lateral regions.  6. The mitral valve is normal in structure. Mild mitral valve regurgitation.  7. The aortic valve is tricuspid. There is mild calcification of the aortic valve. Aortic valve regurgitation is not visualized. Mild aortic valve sclerosis is present, with no evidence of aortic valve stenosis.  8. The inferior vena cava is dilated in size with <50% respiratory variability, suggesting right atrial pressure of 15 mmHg. Conclusion(s)/Recommendation(s): Patient in atrial flutter with RVR during study. Discussed severely reduced EF with Dr. Gasper Sells, cardiologist on call. FINDINGS  Left Ventricle: While there is no visualized LV thrombus on sweeps of the apex, patient did not have IV access, unable to use echo contrast for confirmation. Left ventricular ejection fraction, by estimation, is <20%. The left ventricle has severely decreased function. The left ventricle demonstrates global hypokinesis. The  left ventricular internal cavity size was moderately dilated. There is borderline left ventricular hypertrophy. Left ventricular diastolic function could not be evaluated. Right Ventricle: The right ventricular size is mildly enlarged. Right vetricular wall thickness was not well visualized. Right ventricular systolic function is normal. There is  mildly elevated pulmonary artery systolic pressure. The tricuspid regurgitant  velocity is 2.71 m/s, and with an assumed right atrial pressure of 15 mmHg, the estimated right ventricular systolic pressure is 24.0 mmHg. Left Atrium: Left atrial size was severely dilated. Right Atrium: Right atrial size was severely dilated. Pericardium: Trivial pericardial effusion is present. Mitral Valve: The mitral valve is normal in structure. Mild mitral valve regurgitation. Tricuspid Valve: The tricuspid valve is normal in structure. Tricuspid valve regurgitation is mild. Aortic Valve: The aortic valve is tricuspid. There is mild calcification of the aortic valve. Aortic valve regurgitation is not visualized. Mild aortic valve sclerosis is present, with no evidence of aortic valve stenosis. Pulmonic Valve: The pulmonic valve was not well visualized. Pulmonic valve regurgitation is not visualized. No evidence of pulmonic stenosis. Aorta: The aortic root, ascending aorta and aortic arch are all structurally normal, with no evidence of dilitation or obstruction. Venous: The inferior vena cava is dilated in size with less than 50% respiratory variability, suggesting right atrial pressure of 15 mmHg. IAS/Shunts: The atrial septum is grossly normal. Additional Comments: There is a moderate pleural effusion in both left and right lateral regions.  LEFT VENTRICLE PLAX 2D LVIDd:         5.60 cm LVIDs:         5.30 cm LV PW:         1.10 cm LV IVS:        1.20 cm LVOT diam:     2.20 cm LV SV:         21 LV SV Index:   9 LVOT Area:     3.80 cm  RIGHT VENTRICLE             IVC RV Basal diam:  4.50 cm     IVC diam: 3.40 cm RV Mid diam:    3.90 cm RV S prime:     10.80 cm/s TAPSE (M-mode): 1.6 cm LEFT ATRIUM              Index       RIGHT ATRIUM           Index LA diam:        4.80 cm  2.08 cm/m  RA Area:     34.10 cm LA Vol (A2C):   122.0 ml 52.89 ml/m RA Volume:   139.00 ml 60.26 ml/m LA Vol (A4C):   97.7 ml  42.35 ml/m LA Biplane  Vol: 112.0 ml 48.55 ml/m  AORTIC VALVE LVOT Vmax:   47.20 cm/s LVOT Vmean:  30.567 cm/s LVOT VTI:    0.055 m  AORTA Ao Root diam: 4.20 cm Ao Asc diam:  3.60 cm TRICUSPID VALVE TR Peak grad:   29.4 mmHg TR Vmax:        271.00 cm/s  SHUNTS Systemic VTI:  0.06 m Systemic Diam: 2.20 cm Buford Dresser MD Electronically signed by Buford Dresser MD Signature Date/Time: 02/02/2020/7:41:05 PM    Final    ECHO TEE  Result Date: 02/11/2020    TRANSESOPHOGEAL ECHO REPORT   Patient Name:   Thomas [name] Date of Exam: 02/11/2020 Medical Rec #:  [id number]       Height:  80.0 in Accession #:    [id number]      Weight:       185.8 lb Date of Birth:  Jul 21, 1950       BSA:          2.229 m Patient Age:    4 years        BP:           106/79 mmHg Patient Gender: M               HR:           114 bpm. Exam Location:  Inpatient Procedure: 3D Echo, Transesophageal Echo, Cardiac Doppler and Color Doppler Indications:     I48.92* Unspecified atrial flutter  History:         Patient has prior history of Echocardiogram examinations, most                  recent 02/02/2020. Abnormal ECG, Arrythmias:Atrial Flutter,                  Signs/Symptoms:Dyspnea; Risk Factors:Hypertension. Cancer.                  Edema.  Sonographer:     Roseanna Rainbow RDCS Referring Phys:  [id number] [place] Diagnosing Phys: Eleonore Chiquito MD PROCEDURE: After discussion of the risks and benefits of a TEE, an informed consent was obtained from the patient. TEE procedure time was 18 minutes. The transesophogeal probe was passed without difficulty through the esophogus of the patient. Imaged were obtained with the patient in a left lateral decubitus position. Local oropharyngeal anesthetic was provided with Cetacaine. Sedation performed by different physician. The patient was monitored while under deep sedation. Anesthestetic sedation was provided intravenously by Anesthesiology: 237m of Propofol, 427mof Lidocaine. The patient's vital  signs; including heart rate, blood pressure, and oxygen saturation; remained stable throughout the procedure. The patient developed no complications during the procedure. A successful direct current cardioversion was performed at 120 joules with 1 attempt. IMPRESSIONS  1. Severely reduced LV function, LVEF~10-15%. LVOT VTI ~5.5 cm. Left ventricular ejection fraction, by estimation, is 10-15%. The left ventricle has severely decreased function. The left ventricle demonstrates global hypokinesis. The left ventricular internal cavity size was moderately to severely dilated.  2. Evidence of atrial level shunting detected by color flow Doppler. Agitated saline contrast bubble study was positive with shunting observed within 3-6 cardiac cycles suggestive of interatrial shunt. There is a small patent foramen ovale with predominantly left to right shunting across the atrial septum.  3. Right ventricular systolic function is severely reduced. The right ventricular size is severely enlarged.  4. Left atrial size was mild to moderately dilated. No left atrial/left atrial appendage thrombus was detected. The LAA emptying velocity was 21 cm/s.  5. Right atrial size was mildly dilated.  6. The mitral valve is grossly normal. Mild mitral valve regurgitation. No evidence of mitral stenosis.  7. Tricuspid valve regurgitation is mild to moderate.  8. The aortic valve is tricuspid. Aortic valve regurgitation is trivial. No aortic stenosis is present. FINDINGS  Left Ventricle: Severely reduced LV function, LVEF~10-15%. LVOT VTI ~5.5 cm. Left ventricular ejection fraction, by estimation, is 10-15%. The left ventricle has severely decreased function. The left ventricle demonstrates global hypokinesis. The left ventricular internal cavity size was moderately to severely dilated. Right Ventricle: The right ventricular size is severely enlarged. No increase in right ventricular wall thickness. Right ventricular systolic function is severely  reduced.  Left Atrium: Left atrial size was mild to moderately dilated. No left atrial/left atrial appendage thrombus was detected. The LAA emptying velocity was 21 cm/s. Right Atrium: Right atrial size was mildly dilated. Pericardium: There is no evidence of pericardial effusion. Mitral Valve: The mitral valve is grossly normal. Mild mitral valve regurgitation. No evidence of mitral valve stenosis. Tricuspid Valve: The tricuspid valve is grossly normal. Tricuspid valve regurgitation is mild to moderate. No evidence of tricuspid stenosis. Aortic Valve: The aortic valve is tricuspid. Aortic valve regurgitation is trivial. No aortic stenosis is present. Pulmonic Valve: The pulmonic valve was grossly normal. Pulmonic valve regurgitation is trivial. No evidence of pulmonic stenosis. Aorta: The aortic root and ascending aorta are structurally normal, with no evidence of dilitation. There is minimal (Grade I) layered plaque involving the descending aorta. Venous: The left upper pulmonary vein is normal. IAS/Shunts: Evidence of atrial level shunting detected by color flow Doppler. Agitated saline contrast was given intravenously to evaluate for intracardiac shunting. Agitated saline contrast bubble study was positive with shunting observed within 3-6 cardiac cycles suggestive of interatrial shunt. A small patent foramen ovale is detected with predominantly left to right shunting across the atrial septum.  LEFT VENTRICLE PLAX 2D LVOT diam:     2.30 cm LV SV:         23 LV SV Index:   10 LVOT Area:     4.15 cm  AORTIC VALVE LVOT Vmax:   44.88 cm/s LVOT Vmean:  32.166 cm/s LVOT VTI:    0.056 m  AORTA Ao Root diam: 3.90 cm Ao Asc diam:  3.30 cm TRICUSPID VALVE TR Peak grad:   24.6 mmHg TR Vmax:        248.00 cm/s  SHUNTS Systemic VTI:  0.06 m Systemic Diam: 2.30 cm Eleonore Chiquito MD Electronically signed by Eleonore Chiquito MD Signature Date/Time: 02/11/2020/6:55:02 PM    Final    Disposition   Pt is being discharged home  today in good condition.  Follow-up Plans & Appointments     Follow-up Information    Tommie Raymond, NP Follow up.   Specialty: Cardiology Why: Hospital follow-up shceduled for 02/24/2020 at 2:15pm with Kathyrn Drown, one of Dr. Jacolyn Reedy NPs. Please arrive 15 minutes early for check-in. If this date/time does not work for you, please call our office to reschedule. Contact information: [address] [place] [address] [phone]        Sherran Needs, NP Follow up.   Specialties: Nurse Practitioner, Cardiology Why: Follow-up in the Atrial Fibrillation clinic scheduled for 02/18/2020 at 10:00am. You can enter through [place] code is [id number]. Contact information: [place] [address] [phone]        [facility] [address] Office Follow up.   Specialty: Cardiology Why: Please come by our office anytime on Monday 02/16/2020 so that we can recheck your kidney function and electrolytes. You can come by anytime from 8:00am to 4:30pm (lab is closed from 12-1pm for lunch).  Contact information: [address] [place] [phone]       Podiatry Follow up.   Why: Follow-up with your Podiatrist as directed.       [name], MD Follow up.   Specialty: Family Medicine Why: Please call you PCP and schedule follow-up appointment withint he next couple of weeks. Contact information: [place] [address] [phone]  Discharge Instructions    Diet - low sodium heart healthy   Complete by: As directed    Discharge wound care:   Complete by: As directed    Please follow wound care instructions as directed by Podiatry.   Increase activity slowly   Complete by: As directed       Discharge Medications   Allergies as of 02/12/2020      Reactions   Chlorthalidone Other (See Comments)   "Makes me light-headed and I don't like the  way it makes me feel"      Medication List    STOP taking these medications   furosemide 20 MG tablet Commonly known as: LASIX   ibuprofen 800 MG tablet Commonly known as: ADVIL   meloxicam 15 MG tablet Commonly known as: MOBIC   metoprolol succinate 25 MG 24 hr tablet Commonly known as: TOPROL-XL   naproxen sodium 220 MG tablet Commonly known as: ALEVE   tamsulosin 0.4 MG Caps capsule Commonly known as: FLOMAX   testosterone cypionate 200 MG/ML injection Commonly known as: DEPOTESTOSTERONE CYPIONATE     TAKE these medications   amiodarone 200 MG tablet Commonly known as: PACERONE Take 1 tablet (200 mg total) by mouth 2 (two) times daily.   apixaban 5 MG Tabs tablet Commonly known as: ELIQUIS Take 1 tablet (5 mg total) by mouth 2 (two) times daily.   atorvastatin 40 MG tablet Commonly known as: LIPITOR Take 1 tablet (40 mg total) by mouth daily.   DULoxetine 60 MG capsule Commonly known as: CYMBALTA Take 1 capsule (60 mg total) by mouth daily.   Enbrel SureClick 50 MG/ML injection Generic drug: etanercept Inject 25 mg into the skin once a week.   Melatonin 10 MG Tabs Take 10 mg by mouth at bedtime.   methotrexate 2.5 MG tablet Commonly known as: RHEUMATREX Take 20 mg by mouth once a week.   Narcan 4 MG/0.1ML Liqd nasal spray kit Generic drug: naloxone Place 1 spray into the nose as needed (as directed for emergency). What changed: how much to take   Oxycodone HCl 10 MG Tabs Take 1 tablet (10 mg total) by mouth every 4 (four) hours while awake for 7 days.   oxyCODONE-acetaminophen 10-325 MG tablet Commonly known as: Percocet Take 1 tablet by mouth every 4 (four) hours as needed for pain. What changed: when to take this   oxyCODONE-acetaminophen 10-325 MG tablet Commonly known as: Percocet Take 1 tablet by mouth every 4 (four) hours as needed for pain. What changed: You were already taking a medication with the same name, and this prescription was  added. Make sure you understand how and when to take each.   potassium chloride SA 20 MEQ tablet Commonly known as: KLOR-CON Take 2 tablets (40 mEq total) by mouth 2 (two) times daily.   predniSONE 10 MG tablet Commonly known as: DELTASONE Take 10 mg by mouth daily with breakfast.   torsemide 20 MG tablet Commonly known as: DEMADEX Take 1 tablet (20 mg total) by mouth 2 (two) times daily.            Discharge Care Instructions  (From admission, onward)         Start     Ordered   02/11/20 0000  Discharge wound care:       Comments: Please follow wound care instructions as directed by Podiatry.   02/11/20 1428             Outstanding Labs/Studies   BMP  on 02/16/20  Once meds are titrated up repeat echo in 3 months to eval for improvement or ICD  Duration of Discharge Encounter   Greater than 30 minutes including physician time.  Signed, Cecilie Kicks, NP 02/12/2020, 10:38 AM

## 2020-02-13 NOTE — Anesthesia Postprocedure Evaluation (Signed)
Anesthesia Post Note  Patient: Thomas [name]  Procedure(s) Performed: TRANSESOPHAGEAL ECHOCARDIOGRAM (TEE) (N/A ) CARDIOVERSION (N/A ) BUBBLE STUDY     Patient location during evaluation: PACU Anesthesia Type: MAC Level of consciousness: awake and alert Pain management: pain level controlled Respiratory status: spontaneous breathing, nonlabored ventilation, respiratory function stable and patient connected to nasal cannula oxygen Cardiovascular status: unstable Postop Assessment: no apparent nausea or vomiting Comments: Patient with hypotension post-op. Norepinephrine started with improvement in BP. Plan to wean Norepinephrine as tolerated   No complications documented.  Last Vitals:  Vitals:   02/12/20 0700 02/12/20 0750  BP: (!) 85/73 103/78  Pulse:  82  Resp:  18  Temp:  37 C  SpO2:  100%    Last Pain:  Vitals:   02/12/20 1000  TempSrc:   PainSc: 6                  Evann Erazo COKER

## 2020-02-16 ENCOUNTER — Other Ambulatory Visit: Payer: Self-pay

## 2020-02-16 ENCOUNTER — Other Ambulatory Visit: Payer: Medicare Other

## 2020-02-16 DIAGNOSIS — I5043 Acute on chronic combined systolic (congestive) and diastolic (congestive) heart failure: Secondary | ICD-10-CM

## 2020-02-16 DIAGNOSIS — Z79899 Other long term (current) drug therapy: Secondary | ICD-10-CM | POA: Diagnosis not present

## 2020-02-16 DIAGNOSIS — E876 Hypokalemia: Secondary | ICD-10-CM

## 2020-02-16 LAB — BASIC METABOLIC PANEL
BUN/Creatinine Ratio: 23 (ref 10–24)
BUN: 26 mg/dL (ref 8–27)
CO2: 31 mmol/L — ABNORMAL HIGH (ref 20–29)
Calcium: 9.2 mg/dL (ref 8.6–10.2)
Chloride: 96 mmol/L (ref 96–106)
Creatinine, Ser: 1.14 mg/dL (ref 0.76–1.27)
GFR calc Af Amer: 77 mL/min/{1.73_m2} (ref >59)
GFR calc non Af Amer: 66 mL/min/{1.73_m2} (ref >59)
Glucose: 81 mg/dL (ref 65–99)
Potassium: 4.8 mmol/L (ref 3.5–5.2)
Sodium: 139 mmol/L (ref 134–144)

## 2020-02-18 ENCOUNTER — Encounter (HOSPITAL_COMMUNITY): Payer: Self-pay | Admitting: Nurse Practitioner

## 2020-02-18 ENCOUNTER — Other Ambulatory Visit: Payer: Self-pay

## 2020-02-18 ENCOUNTER — Ambulatory Visit (HOSPITAL_COMMUNITY)
Admit: 2020-02-18 | Discharge: 2020-02-18 | Disposition: A | Discharge status: Home / Self Care | Payer: Medicare Other | Source: Ambulatory Visit | Attending: Nurse Practitioner | Admitting: Nurse Practitioner

## 2020-02-18 ENCOUNTER — Ambulatory Visit (INDEPENDENT_AMBULATORY_CARE_PROVIDER_SITE_OTHER): Payer: Medicare Other

## 2020-02-18 ENCOUNTER — Ambulatory Visit (INDEPENDENT_AMBULATORY_CARE_PROVIDER_SITE_OTHER): Payer: Medicare Other | Admitting: Podiatry

## 2020-02-18 VITALS — BP 102/64 | HR 73 | Ht >= 80 in | Wt 192.0 lb

## 2020-02-18 DIAGNOSIS — Z89411 Acquired absence of right great toe: Secondary | ICD-10-CM | POA: Diagnosis not present

## 2020-02-18 DIAGNOSIS — I484 Atypical atrial flutter: Secondary | ICD-10-CM

## 2020-02-18 DIAGNOSIS — I10 Essential (primary) hypertension: Secondary | ICD-10-CM | POA: Diagnosis not present

## 2020-02-18 DIAGNOSIS — G894 Chronic pain syndrome: Secondary | ICD-10-CM | POA: Diagnosis not present

## 2020-02-18 DIAGNOSIS — D6869 Other thrombophilia: Secondary | ICD-10-CM | POA: Diagnosis not present

## 2020-02-18 DIAGNOSIS — I428 Other cardiomyopathies: Secondary | ICD-10-CM | POA: Diagnosis not present

## 2020-02-18 DIAGNOSIS — M25529 Pain in unspecified elbow: Secondary | ICD-10-CM | POA: Diagnosis not present

## 2020-02-18 DIAGNOSIS — M216X2 Other acquired deformities of left foot: Secondary | ICD-10-CM

## 2020-02-18 DIAGNOSIS — M25551 Pain in right hip: Secondary | ICD-10-CM | POA: Diagnosis not present

## 2020-02-18 DIAGNOSIS — Z79899 Other long term (current) drug therapy: Secondary | ICD-10-CM | POA: Diagnosis not present

## 2020-02-18 DIAGNOSIS — I4892 Unspecified atrial flutter: Secondary | ICD-10-CM

## 2020-02-18 DIAGNOSIS — Z7901 Long term (current) use of anticoagulants: Secondary | ICD-10-CM | POA: Diagnosis not present

## 2020-02-18 DIAGNOSIS — Z79891 Long term (current) use of opiate analgesic: Secondary | ICD-10-CM | POA: Diagnosis not present

## 2020-02-18 DIAGNOSIS — L97512 Non-pressure chronic ulcer of other part of right foot with fat layer exposed: Secondary | ICD-10-CM | POA: Diagnosis not present

## 2020-02-18 DIAGNOSIS — M069 Rheumatoid arthritis, unspecified: Secondary | ICD-10-CM | POA: Diagnosis not present

## 2020-02-18 NOTE — Patient Instructions (Signed)
On November 18th reduce Amiodarone to 200mg  once a day

## 2020-02-18 NOTE — Progress Notes (Addendum)
Primary Care Physician: [name], MD Referring Physician: [facility] f/u Cardiologist: Dr. Idolina Primer is a 69 y.o. male with a h/o  HTN, prostate cancer, and atrial flutter who is in the afib clinic for f/u hospitalization for atrial flutter with RVR, worsening shortness of breath and LLE. He underwent diuresis, LHC and  successful TEE guided DCCV. He was started on amiodarone 200 mg bid and eliquis 5 mg bid.  In the clinic today, he continues to feel well.No further shortness of breath. Edema resolved. He was d/c on torsemide and potassium. He has not noted any heart racing. He continues on amiodarone and is in SR.   Today, he denies symptoms of palpitations, chest pain, shortness of breath, orthopnea, PND, lower extremity edema, dizziness, presyncope, syncope, or neurologic sequela. The patient is tolerating medications without difficulties and is otherwise without complaint today.   Past Medical History:  Diagnosis Date  . Atrial flutter ([place]) 01/2020  . Cancer [facility])    prostate  . Claustrophobia    quite severe  . Heart failure with reduced ejection fraction ([place])   . Hypertension   . Hypoglycemia    occ  . Left knee DJD    Xray 12/23/08  . NICM (nonischemic cardiomyopathy) ([place]) 02/12/2020  . Prostate cancer [facility])    Past Surgical History:  Procedure Laterality Date  . AMPUTATION TOE Right 12/2019  . BUBBLE STUDY  02/11/2020   Procedure: BUBBLE STUDY;  Surgeon: Geralynn Rile, MD;  Location: [place];  Service: Cardiovascular;;  . CARDIOVERSION N/A 02/11/2020   Procedure: CARDIOVERSION;  Surgeon: Geralynn Rile, MD;  Location: [place];  Service: Cardiovascular;  Laterality: N/A;  . CYSTOSCOPY  04/11/2018   Procedure: Erlene Quan;  Surgeon: [name], MD;  Location[facility];  Service: Urology;;  NO SEEDS FOUND IN BLADDER  . HERNIA REPAIR  2009   inguinal  . JOINT REPLACEMENT Bilateral 2017     knees  . RADIOACTIVE SEED IMPLANT N/A 04/11/2018   Procedure: RADIOACTIVE SEED IMPLANT/BRACHYTHERAPY IMPLANT;  Surgeon: [name], MD;  Location: [facility];  Service: Urology;  Laterality: N/A;   71     SEEDS IMPLANTED  . RIGHT/LEFT HEART CATH AND CORONARY ANGIOGRAPHY N/A 02/09/2020   Procedure: RIGHT/LEFT HEART CATH AND CORONARY ANGIOGRAPHY;  Surgeon: Nelva Bush, MD;  Location: [facility];  Service: Cardiovascular;  Laterality: N/A;  . SPACE OAR INSTILLATION N/A 04/11/2018   Procedure: SPACE OAR INSTILLATION;  Surgeon: [name], MD;  Location: [facility];  Service: Urology;  Laterality: N/A;  . TEE WITHOUT CARDIOVERSION N/A 02/11/2020   Procedure: TRANSESOPHAGEAL ECHOCARDIOGRAM (TEE);  Surgeon: Geralynn Rile, MD;  Location: [place];  Service: Cardiovascular;  Laterality: N/A;  . TOTAL KNEE ARTHROPLASTY Bilateral 09/13/2015   Procedure: BILATERAL KNEE ARTHROPLASTY ;  Surgeon: Paralee Cancel, MD;  Location: [facility];  Service: Orthopedics;  Laterality: Bilateral;    Current Outpatient Medications  Medication Sig Dispense Refill  . amiodarone (PACERONE) 200 MG tablet Take 1 tablet (200 mg total) by mouth 2 (two) times daily. 60 tablet 2  . apixaban (ELIQUIS) 5 MG TABS tablet Take 1 tablet (5 mg total) by mouth 2 (two) times daily. 60 tablet 3  . atorvastatin (LIPITOR) 40 MG tablet Take 1 tablet (40 mg total) by mouth daily. 30 tablet 2  . DULoxetine (CYMBALTA) 60 MG capsule Take 1 capsule (60 mg total) by mouth daily. [place]  capsule 0  . ENBREL SURECLICK 50 MG/ML injection Inject 25 mg into the skin once a week.     . Melatonin 10 MG TABS Take 10 mg by mouth at bedtime.    . methotrexate (RHEUMATREX) 2.5 MG tablet Take 20 mg by mouth once a week.     . potassium chloride SA (KLOR-CON) 20 MEQ tablet Take 2 tablets (40 mEq total) by mouth 2 (two) times daily. 60 tablet 2  . torsemide (DEMADEX) 20 MG tablet Take 1 tablet (20  mg total) by mouth 2 (two) times daily. 60 tablet 2  . NARCAN 4 MG/0.1ML LIQD nasal spray kit Place 1 spray into the nose as needed (as directed for emergency). (Patient not taking: Reported on 02/18/2020) 2 each 0  . oxyCODONE 10 MG TABS Take 1 tablet (10 mg total) by mouth every 4 (four) hours while awake for 7 days. (Patient not taking: Reported on 02/18/2020) 35 tablet 0  . predniSONE (DELTASONE) 10 MG tablet Take 10 mg by mouth daily with breakfast. (Patient not taking: Reported on 02/18/2020)     No current facility-administered medications for this encounter.    Allergies  Allergen Reactions  . Chlorthalidone Other (See Comments)    "Makes me light-headed and I don't like the way it makes me feel"    Social History   Socioeconomic History  . Marital status: Married    Spouse name: Not on file  . Number of children: Not on file  . Years of education: Not on file  . Highest education level: Not on file  Occupational History  . Not on file  Tobacco Use  . Smoking status: Never Smoker  . Smokeless tobacco: Never Used  Vaping Use  . Vaping Use: Never used  Substance and Sexual Activity  . Alcohol use: No    Alcohol/week: 0.0 standard drinks  . Drug use: Not Currently  . Sexual activity: Yes  Other Topics Concern  . Not on file  Social History Narrative  . Not on file   Social Determinants of Health   Financial Resource Strain:   . Difficulty of Paying Living Expenses: Not on file  Food Insecurity:   . Worried About Charity fundraiser in the Last Year: Not on file  . Ran Out of Food in the Last Year: Not on file  Transportation Needs:   . Lack of Transportation (Medical): Not on file  . Lack of Transportation (Non-Medical): Not on file  Physical Activity:   . Days of Exercise per Week: Not on file  . Minutes of Exercise per Session: Not on file  Stress:   . Feeling of Stress : Not on file  Social Connections:   . Frequency of Communication with Friends and  Family: Not on file  . Frequency of Social Gatherings with Friends and Family: Not on file  . Attends Religious Services: Not on file  . Active Member of Clubs or Organizations: Not on file  . Attends Archivist Meetings: Not on file  . Marital Status: Not on file  Intimate Partner Violence:   . Fear of Current or Ex-Partner: Not on file  . Emotionally Abused: Not on file  . Physically Abused: Not on file  . Sexually Abused: Not on file    Family History  Problem Relation Age of Onset  . Prostate cancer Father   . Prostate cancer Brother   . Colon cancer Neg Hx   . Rectal cancer Neg Hx   .  Stomach cancer Neg Hx     ROS- All systems are reviewed and negative except as per the HPI above  Physical Exam: Vitals:   02/18/20 0951  BP: 102/64  Pulse: 73  Weight: 87.1 kg  Height: 6' 8" (2.032 m)   Wt Readings from Last 3 Encounters:  02/18/20 87.1 kg  02/12/20 90.7 kg  02/02/20 99.8 kg    Labs: Lab Results  Component Value Date   NA 139 02/16/2020   K 4.8 02/16/2020   CL 96 02/16/2020   CO2 31 (H) 02/16/2020   GLUCOSE 81 02/16/2020   BUN 26 02/16/2020   CREATININE 1.14 02/16/2020   CALCIUM 9.2 02/16/2020   MG 2.0 02/12/2020   Lab Results  Component Value Date   INR 1.3 (H) 02/10/2020   Lab Results  Component Value Date   CHOL 111 02/04/2020   HDL 32 (L) 02/04/2020   LDLCALC 65 02/04/2020   TRIG 72 02/04/2020     GEN- The patient is well appearing, alert and oriented x 3 today.   Head- normocephalic, atraumatic Eyes-  Sclera clear, conjunctiva pink Ears- hearing intact Oropharynx- clear Neck- supple, no JVP Lymph- no cervical lymphadenopathy Lungs- Clear to ausculation bilaterally, normal work of breathing Heart- Regular rate and rhythm, no murmurs, rubs or gallops, PMI not laterally displaced GI- soft, NT, ND, + BS Extremities- no clubbing, cyanosis, or edema MS- no significant deformity or atrophy Skin- no rash or lesion Psych- euthymic  mood, full affect Neuro- strength and sensation are intact  EKG- NSR at 73 bpm,pr int 192 ms, qrs int 104 ms qtc 460 ms  Epic records reviewed  Echo-While there is no visualized LV thrombus on sweeps of the apex,  patient did not have IV access, unable to use echo contrast for  confirmation. Left ventricular ejection fraction, by estimation, is <20%.  The left ventricle has severely decreased  function. The left ventricle demonstrates global hypokinesis. The left  ventricular internal cavity size was moderately dilated. Left ventricular  diastolic function could not be evaluated.  2. Right ventricular systolic function is normal. The right ventricular  size is mildly enlarged. There is mildly elevated pulmonary artery  systolic pressure.  3. Left atrial size was severely dilated.  4. Right atrial size was severely dilated.  5. Moderate pleural effusion in both left and right lateral regions.  6. The mitral valve is normal in structure. Mild mitral valve  regurgitation.  7. The aortic valve is tricuspid. There is mild calcification of the  aortic valve. Aortic valve regurgitation is not visualized. Mild aortic  valve sclerosis is present, with no evidence of aortic valve stenosis.  8. The inferior vena cava is dilated in size with <50% respiratory  variability, suggesting right atrial pressure of 15 mmHg.   1. LHC- No angiographically significant coronary artery disease. 2. Upper normal left heart, right heart, and pulmonary artery pressures. 3. Mildly reduced Fick cardiac output/index.     Assessment and Plan: 1. Persistent atrial flutter S/p successful cardioversion In SR today  Loading on amiodarone 200 mg bid since 10/21 Will decrease dose of drug after 4 weeks  to 200 mg daily on  11/18   2. CHA2DS2VASc score of at least 3 Continue  eliquis 5 mg bid   3. NICM Normovolemic  EF less than 20%  Continue torsemide 20 mg qd and K+ 40 meq bid   bmet checked 10/25  and stable   F/u with Chrissie Noa, NP, 11/2 afib clinic  as needed   Geroge Baseman. , [facility] [address] [place], [place] [address] [phone]

## 2020-02-19 ENCOUNTER — Other Ambulatory Visit: Payer: Self-pay | Admitting: Student

## 2020-02-19 ENCOUNTER — Encounter: Payer: Self-pay | Admitting: Podiatry

## 2020-02-19 NOTE — Progress Notes (Signed)
Subjective:  Patient ID: Thomas [name], male    DOB: March 28, 1951,  MRN: [id number]  Chief Complaint  Patient presents with  . Routine Post Op    POV #3 DOS 01/05/2020 LT 2ND METATARSAL OSTEOTOMY, RT HALLUX AMPUTATION    69 y.o. male returns for post-op check.  Patient states he is doing well. He has been keeping his bandages clean dry and intact. He has been ambulating with bilateral surgical shoe. He was hospitalized recently for which she was just discharged after 10 days. He states that problems have been doing good.  Review of Systems: Negative except as noted in the HPI. Denies N/V/F/Ch.  Past Medical History:  Diagnosis Date  . Atrial flutter ([place]) 01/2020  . Cancer [facility])    prostate  . Claustrophobia    quite severe  . Heart failure with reduced ejection fraction ([place])   . Hypertension   . Hypoglycemia    occ  . Left knee DJD    Xray 12/23/08  . NICM (nonischemic cardiomyopathy) ([place]) 02/12/2020  . Prostate cancer [facility])     Current Outpatient Medications:  .  amiodarone (PACERONE) 200 MG tablet, Take 1 tablet (200 mg total) by mouth 2 (two) times daily., Disp: 60 tablet, Rfl: 2 .  apixaban (ELIQUIS) 5 MG TABS tablet, Take 1 tablet (5 mg total) by mouth 2 (two) times daily., Disp: 60 tablet, Rfl: 3 .  atorvastatin (LIPITOR) 40 MG tablet, Take 1 tablet (40 mg total) by mouth daily., Disp: 30 tablet, Rfl: 2 .  DULoxetine (CYMBALTA) 60 MG capsule, Take 1 capsule (60 mg total) by mouth daily., Disp: 30 capsule, Rfl: 0 .  ENBREL SURECLICK 50 MG/ML injection, Inject 25 mg into the skin once a week. , Disp: , Rfl:  .  Melatonin 10 MG TABS, Take 10 mg by mouth at bedtime., Disp: , Rfl:  .  methotrexate (RHEUMATREX) 2.5 MG tablet, Take 20 mg by mouth once a week. , Disp: , Rfl:  .  NARCAN 4 MG/0.1ML LIQD nasal spray kit, Place 1 spray into the nose as needed (as directed for emergency). (Patient not taking: Reported on 02/18/2020), Disp: 2 each, Rfl: 0 .  potassium chloride SA  (KLOR-CON) 20 MEQ tablet, Take 2 tablets (40 mEq total) by mouth 2 (two) times daily., Disp: 60 tablet, Rfl: 2 .  predniSONE (DELTASONE) 10 MG tablet, Take 10 mg by mouth daily with breakfast. (Patient not taking: Reported on 02/18/2020), Disp: , Rfl:  .  torsemide (DEMADEX) 20 MG tablet, Take 1 tablet (20 mg total) by mouth 2 (two) times daily., Disp: 60 tablet, Rfl: 2  Social History   Tobacco Use  Smoking Status Never Smoker  Smokeless Tobacco Never Used    Allergies  Allergen Reactions  . Chlorthalidone Other (See Comments)    "Makes me light-headed and I don't like the way it makes me feel"   Objective:  There were no vitals filed for this visit. There is no height or weight on file to calculate BMI. Constitutional Well developed. Well nourished.  Vascular Foot warm and well perfused. Capillary refill normal to all digits.   Neurologic Normal speech. Oriented to person, place, and time. Epicritic sensation to light touch grossly present bilaterally.  Dermatologic Skin healing well without signs of infection. Skin edges well coapted without signs of infection.  Wound Location: Right hallux amputation site Wound Base: Mixed Granular/Fibrotic Peri-wound: Calloused Exudate: Scant/small amount Serosanguinous exudate  Orthopedic: Tenderness to palpation noted about the surgical site.  Radiographs: 3 views of skeletally mature adult bilateral foot: Hallux amputation site noted to the right side.  No signs of osteomyelitis noted.  Left second metatarsal osteotomy noted appears to be floating well.  Appears to be in good correction alignment. Assessment:   1. Plantar flexed metatarsal bone of left foot   2. Status post amputation of great toe, right [facility])    Plan:  Patient was evaluated and treated and all questions answered.  S/p foot surgery bilaterally -Progressing as expected post-operatively. -XR: See above -WB Status: Weightbearing as tolerated in bilateral surgical  shoe -Sutures: Intact on the left side.  Mild superficial dehiscence noted to the right side.  No clinical signs of infection noted. -Medications: Vicodin/Norco -Foot redressed with Betadine wet-to-dry dressings  Left submetatarsal two wound limited to the breakdown of the skin -This appears to be clinically resolving.  The wound has gotten considerably smaller.  I discussed with him to continue offloading with surgical shoe.  Patient states understanding.   Ulcer right foot also with fat layer exposed status post dehiscence from hallux amputation -Debridement as below. -Dressed with Betadine wet-to-dry, DSD. -Continue off-loading with surgical shoe.  Procedure: Excisional Debridement of Wound Tool: Sharp chisel blade/tissue nipper Rationale: Removal of non-viable soft tissue from the wound to promote healing.  Anesthesia: none Pre-Debridement Wound Measurements: 2 cm x 1 cm x 0.4 cm Post-Debridement Wound Measurements: 2.2 cm x 1.0 cm x 0.5 cm Type of Debridement: Sharp Excisional Tissue Removed: Non-viable soft tissue Blood loss: Minimal (<50cc) Depth of Debridement: subcutaneous tissue. Technique: Sharp excisional debridement to bleeding, viable wound base.  Wound Progress: The wound appears to be decreasing. Dressing: Dry, sterile, compression dressing. Disposition: Patient tolerated procedure well. Patient to return in 1 week for follow-up.  No follow-ups on file.     No follow-ups on file.

## 2020-02-23 NOTE — Progress Notes (Deleted)
Cardiology Office Note   Date:  02/23/2020   ID:  Thomas [name], DOB Mar 31, 1951, MRN [id number]  PCP:  [name], MD  Cardiologist: Dr. Johney Frame, MD  No chief complaint on file.     History of Present Illness: Thomas [name] is a 69 y.o. male who presents for post DCCV follow-up, seen for Dr. Johney Frame.  Thomas [name] has a history of hypertension, prostate cancer and atrial flutter who was initially seen during hospitalization for atrial flutter with RVR, worsening shortness of breath and LE edema.  He was diuresed with IV Lasix and underwent an LHC which showed normal coronary disease.  Due to his symptoms, he underwent successful TEE/DCCV 02/11/2020 to NSR and was started on amiodarone 200 mg twice daily and Eliquis 5 mg twice daily.  Unfortunately, LVEF found to be 10 to 15% with global hypokinesis with LVOT VTI approximately 5.5 cm.  He had some issues during his hospital course with hypotension and was briefly on vasopressors which were weaned off.  Discharge weight 90.7 kg.  He was net -13 L and discharged on torsemide 20 mg twice daily.  He was then seen in follow-up 02/18/2020 in the atrial fibrillation clinic at which time he had no further shortness of breath and edema had resolved.  He was reportedly discharged on torsemide and potassium.  He had no further palpitations.  He was continued on amiodarone and was in normal sinus rhythm. ACE/ARB/ARNI not initiated due to hypotension.   He was initially diagnosed with atrial flutter 11/26/2019 however left AMA on diltiazem and never followed through with cardiology.  He was not placed on anticoagulation due to concern concern for imbalance however without falls.  Today,   1.  Persistent atrial flutter: -Seen during recent hospitalization at which time patient underwent R/LHC due to severe LV dysfunction and hypokinesis which showed no coronary disease.  He then underwent TEE/DCCV to NSR and was placed on amiodarone  200 mg twice daily with plans to reduce dose to 200 mg daily on 03/11/2020.  He is anticoagulated with Eliquis for a CHA2DS2VASc of 3   2.  Nonischemic cardiomyopathy/systolic CHF: -Per echocardiogram/TEE with LVEF at 10 to 15% -Appears euvolemic on exam -GDMT limited during his hospital course due to hypotension  3.  Hypotension: -Briefly on vasopressor support during his hospital course which was weaned off.  BPs limited initiation of GDMT  -BP today -Creatinine at discharge   Past Medical History:  Diagnosis Date  . Atrial flutter ([place]) 01/2020  . Cancer [facility])    prostate  . Claustrophobia    quite severe  . Heart failure with reduced ejection fraction ([place])   . Hypertension   . Hypoglycemia    occ  . Left knee DJD    Xray 12/23/08  . NICM (nonischemic cardiomyopathy) ([place]) 02/12/2020  . Prostate cancer [facility])     Past Surgical History:  Procedure Laterality Date  . AMPUTATION TOE Right 12/2019  . BUBBLE STUDY  02/11/2020   Procedure: BUBBLE STUDY;  Surgeon: Geralynn Rile, MD;  Location: [place];  Service: Cardiovascular;;  . CARDIOVERSION N/A 02/11/2020   Procedure: CARDIOVERSION;  Surgeon: Geralynn Rile, MD;  Location: [place];  Service: Cardiovascular;  Laterality: N/A;  . CYSTOSCOPY  04/11/2018   Procedure: Erlene Quan;  Surgeon: [name], MD;  Location: [facility];  Service: Urology;;  NO SEEDS FOUND IN BLADDER  . HERNIA REPAIR  2009   inguinal  .  JOINT REPLACEMENT Bilateral 2017   knees  . RADIOACTIVE SEED IMPLANT N/A 04/11/2018   Procedure: RADIOACTIVE SEED IMPLANT/BRACHYTHERAPY IMPLANT;  Surgeon: [name], MD;  [facility];  Service: Urology;  Laterality: N/A;   71     SEEDS IMPLANTED  . RIGHT/LEFT HEART CATH AND CORONARY ANGIOGRAPHY N/A 02/09/2020   Procedure: RIGHT/LEFT HEART CATH AND CORONARY ANGIOGRAPHY;  Surgeon: Nelva Bush, MD;  Location: [place] [facility];  Service: Cardiovascular;  Laterality: N/A;  . SPACE OAR INSTILLATION N/A 04/11/2018   Procedure: SPACE OAR INSTILLATION;  Surgeon: [name], MD;  Location: [facility];  Service: Urology;  Laterality: N/A;  . TEE WITHOUT CARDIOVERSION N/A 02/11/2020   Procedure: TRANSESOPHAGEAL ECHOCARDIOGRAM (TEE);  Surgeon: Geralynn Rile, MD;  Location: [place];  Service: Cardiovascular;  Laterality: N/A;  . TOTAL KNEE ARTHROPLASTY Bilateral 09/13/2015   Procedure: BILATERAL KNEE ARTHROPLASTY ;  Surgeon: Paralee Cancel, MD;  Location: [facility];  Service: Orthopedics;  Laterality: Bilateral;     Current Outpatient Medications  Medication Sig Dispense Refill  . amiodarone (PACERONE) 200 MG tablet Take 1 tablet (200 mg total) by mouth 2 (two) times daily. 60 tablet 2  . apixaban (ELIQUIS) 5 MG TABS tablet Take 1 tablet (5 mg total) by mouth 2 (two) times daily. 60 tablet 3  . atorvastatin (LIPITOR) 40 MG tablet Take 1 tablet (40 mg total) by mouth daily. 30 tablet 2  . DULoxetine (CYMBALTA) 60 MG capsule Take 1 capsule (60 mg total) by mouth daily. 30 capsule 0  . ENBREL SURECLICK 50 MG/ML injection Inject 25 mg into the skin once a week.     Marland Kitchen KLOR-CON M20 20 MEQ tablet TAKE 2 TABLETS BY MOUTH 2 TIMES DAILY. 180 tablet 3  . Melatonin 10 MG TABS Take 10 mg by mouth at bedtime.    . methotrexate (RHEUMATREX) 2.5 MG tablet Take 20 mg by mouth once a week.     Marland Kitchen NARCAN 4 MG/0.1ML LIQD nasal spray kit Place 1 spray into the nose as needed (as directed for emergency). (Patient not taking: Reported on 02/18/2020) 2 each 0  . predniSONE (DELTASONE) 10 MG tablet Take 10 mg by mouth daily with breakfast. (Patient not taking: Reported on 02/18/2020)    . torsemide (DEMADEX) 20 MG tablet Take 1 tablet (20 mg total) by mouth 2 (two) times daily. 60 tablet 2   No current facility-administered medications for this visit.    Allergies:   Chlorthalidone    Social History:  The  patient  reports that he has never smoked. He has never used smokeless tobacco. He reports previous drug use. He reports that he does not drink alcohol.   Family History:  The patient's ***family history includes Prostate cancer in his brother and father.    ROS:  Please see the history of present illness.   Otherwise, review of systems are positive for {NONE DEFAULTED:18576::"none"}.   All other systems are reviewed and negative.    PHYSICAL EXAM: VS:  There were no vitals taken for this visit. , BMI There is no height or weight on file to calculate BMI. GEN: Well nourished, well developed, in no acute distress HEENT: normal Neck: no JVD, carotid bruits, or masses Cardiac: ***RRR; no murmurs, rubs, or gallops,no edema  Respiratory:  clear to auscultation bilaterally, normal work of breathing GI: soft, nontender, nondistended, + BS MS: no deformity or atrophy Skin: warm and dry, no rash Neuro:  Strength and  sensation are intact Psych: euthymic mood, full affect   EKG:  EKG {ACTION; IS/IS WSF:[id number]} ordered today. The ekg ordered today demonstrates ***   Recent Labs: 12/25/2019: BNP 901.9 01/21/2020: ALT 13 02/03/2020: TSH 5.337 02/11/2020: Hemoglobin 14.3; Platelets 256 02/12/2020: Magnesium 2.0 02/16/2020: BUN 26; Creatinine, Ser 1.14; Potassium 4.8; Sodium 139    Lipid Panel    Component Value Date/Time   CHOL 111 02/04/2020 0452   TRIG 72 02/04/2020 0452   HDL 32 (L) 02/04/2020 0452   CHOLHDL 3.5 02/04/2020 0452   VLDL 14 02/04/2020 0452   LDLCALC 65 02/04/2020 0452   LDLDIRECT 94 12/06/2010 0917      Wt Readings from Last 3 Encounters:  02/18/20 192 lb (87.1 kg)  02/12/20 200 lb (90.7 kg)  02/02/20 220 lb (99.8 kg)      Other studies Reviewed: Additional studies/ records that were reviewed today include: ***. Review of the above records demonstrates: ***  [facility] 02/09/2020:  Conclusions: 1. No angiographically significant coronary artery  disease. 2. Upper normal left heart, right heart, and pulmonary artery pressures. 3. Mildly reduced Fick cardiac output/index.  Recommendations: 1. Restart heparin infusion 2 hours after TR band removal.  If there is no evidence of bleeding/vascular complication, consider transitioning back to apixaban as soon as tomorrow. 2. Proceed with TEE/cardioversion as soon as tomorrow.  I will make Thomas [name] NPO after midnight in anticipation of this. 3. Optimize evidence-based heart failure therapy for non-ischemic cardiomyopathy. 4. Primary prevention of coronary artery disease.   Echocardiogram 02/02/2020:    1. While there is no visualized LV thrombus on sweeps of the apex,  patient did not have IV access, unable to use echo contrast for  confirmation. Left ventricular ejection fraction, by estimation, is <20%.  The left ventricle has severely decreased  function. The left ventricle demonstrates global hypokinesis. The left  ventricular internal cavity size was moderately dilated. Left ventricular  diastolic function could not be evaluated.  2. Right ventricular systolic function is normal. The right ventricular  size is mildly enlarged. There is mildly elevated pulmonary artery  systolic pressure.  3. Left atrial size was severely dilated.  4. Right atrial size was severely dilated.  5. Moderate pleural effusion in both left and right lateral regions.  6. The mitral valve is normal in structure. Mild mitral valve  regurgitation.  7. The aortic valve is tricuspid. There is mild calcification of the  aortic valve. Aortic valve regurgitation is not visualized. Mild aortic  valve sclerosis is present, with no evidence of aortic valve stenosis.  8. The inferior vena cava is dilated in size with <50% respiratory  variability, suggesting right atrial pressure of 15 mmHg.    TEE echocardiogram 02/11/2020:    1. Severely reduced LV function, LVEF~10-15%. LVOT VTI ~5.5 cm.  Left  ventricular ejection fraction, by estimation, is 10-15%. The left  ventricle has severely decreased function. The left ventricle demonstrates  global hypokinesis. The left ventricular  internal cavity size was moderately to severely dilated.  2. Evidence of atrial level shunting detected by color flow Doppler.  Agitated saline contrast bubble study was positive with shunting observed  within 3-6 cardiac cycles suggestive of interatrial shunt. There is a  small patent foramen ovale with  predominantly left to right shunting across the atrial septum.  3. Right ventricular systolic function is severely reduced. The right  ventricular size is severely enlarged.  4. Left atrial size was mild to moderately dilated. No left atrial/left  atrial  appendage thrombus was detected. The LAA emptying velocity was 21  cm/s.  5. Right atrial size was mildly dilated.  6. The mitral valve is grossly normal. Mild mitral valve regurgitation.  No evidence of mitral stenosis.  7. Tricuspid valve regurgitation is mild to moderate.  8. The aortic valve is tricuspid. Aortic valve regurgitation is trivial.  No aortic stenosis is present.   ASSESSMENT AND PLAN:  1.  ***   Current medicines are reviewed at length with the patient today.  The patient {ACTIONS; HAS/DOES NOT HAVE:19233} concerns regarding medicines.  The following changes have been made:  {PLAN; NO CHANGE:13088:s}  Labs/ tests ordered today include: *** No orders of the defined types were placed in this encounter.    Disposition:   FU with *** in {gen number 9-93:[id number]} {Days to years:10300}  Signed, Kathyrn Drown, NP  02/23/2020 2:28 PM    [facility] [address], [place], [place]  [address] Phone: [phone]; Fax: [phone]

## 2020-02-24 ENCOUNTER — Ambulatory Visit: Payer: Medicare Other | Admitting: Cardiology

## 2020-03-01 ENCOUNTER — Telehealth: Payer: Self-pay | Admitting: Podiatry

## 2020-03-01 ENCOUNTER — Other Ambulatory Visit: Payer: Self-pay | Admitting: Family Medicine

## 2020-03-01 NOTE — Telephone Encounter (Signed)
Appointment scheduled.

## 2020-03-03 ENCOUNTER — Ambulatory Visit (INDEPENDENT_AMBULATORY_CARE_PROVIDER_SITE_OTHER): Payer: Medicare Other

## 2020-03-03 ENCOUNTER — Ambulatory Visit (INDEPENDENT_AMBULATORY_CARE_PROVIDER_SITE_OTHER): Payer: Medicare Other | Admitting: Podiatry

## 2020-03-03 ENCOUNTER — Other Ambulatory Visit: Payer: Self-pay

## 2020-03-03 DIAGNOSIS — M216X2 Other acquired deformities of left foot: Secondary | ICD-10-CM | POA: Diagnosis not present

## 2020-03-03 DIAGNOSIS — Z89411 Acquired absence of right great toe: Secondary | ICD-10-CM

## 2020-03-03 DIAGNOSIS — L97522 Non-pressure chronic ulcer of other part of left foot with fat layer exposed: Secondary | ICD-10-CM | POA: Diagnosis not present

## 2020-03-04 ENCOUNTER — Telehealth: Payer: Self-pay | Admitting: Podiatry

## 2020-03-04 ENCOUNTER — Encounter: Payer: Self-pay | Admitting: Podiatry

## 2020-03-04 MED ORDER — HYDROCODONE-ACETAMINOPHEN 10-325 MG PO TABS
1.0000 | ORAL_TABLET | Freq: Three times a day (TID) | ORAL | 0 refills | Status: AC | PRN
Start: 2020-03-04 — End: 2020-03-09

## 2020-03-04 NOTE — Progress Notes (Signed)
Subjective:  Patient ID: Thomas [name], male    DOB: April 19, 1951,  MRN: [id number]  Chief Complaint  Patient presents with  . Routine Post Op    3 week f/u Bilateral xray on next visit/ POV #4 DOS 01/05/2020 LT 2ND METATARSAL OSTEOTOMY, RT HALLUX AMPUTATION    69 y.o. male returns for post-op check.  Patient states he is doing well. He has been keeping his bandages clean dry and intact. He has been ambulating with bilateral surgical shoe. He was hospitalized recently for which she was just discharged after 10 days. He states that problems have been doing good.  Review of Systems: Negative except as noted in the HPI. Denies N/V/F/Ch.  Past Medical History:  Diagnosis Date  . Atrial flutter ([place]) 01/2020  . Cancer [facility])    prostate  . Claustrophobia    quite severe  . Heart failure with reduced ejection fraction ([place])   . Hypertension   . Hypoglycemia    occ  . Left knee DJD    Xray 12/23/08  . NICM (nonischemic cardiomyopathy) ([place]) 02/12/2020  . Prostate cancer [facility])     Current Outpatient Medications:  .  amiodarone (PACERONE) 200 MG tablet, Take 1 tablet (200 mg total) by mouth 2 (two) times daily., Disp: 60 tablet, Rfl: 2 .  apixaban (ELIQUIS) 5 MG TABS tablet, Take 1 tablet (5 mg total) by mouth 2 (two) times daily., Disp: 60 tablet, Rfl: 3 .  atorvastatin (LIPITOR) 40 MG tablet, Take 1 tablet (40 mg total) by mouth daily., Disp: 30 tablet, Rfl: 2 .  DULoxetine (CYMBALTA) 60 MG capsule, Take 1 capsule (60 mg total) by mouth daily., Disp: 30 capsule, Rfl: 0 .  ENBREL SURECLICK 50 MG/ML injection, Inject 25 mg into the skin once a week. , Disp: , Rfl:  .  KLOR-CON M20 20 MEQ tablet, TAKE 2 TABLETS BY MOUTH 2 TIMES DAILY., Disp: 180 tablet, Rfl: 3 .  Melatonin 10 MG TABS, Take 10 mg by mouth at bedtime., Disp: , Rfl:  .  methotrexate (RHEUMATREX) 2.5 MG tablet, Take 20 mg by mouth once a week. , Disp: , Rfl:  .  NARCAN 4 MG/0.1ML LIQD nasal spray kit, Place 1 spray into the  nose as needed (as directed for emergency)., Disp: 2 each, Rfl: 0 .  predniSONE (DELTASONE) 10 MG tablet, Take 10 mg by mouth daily with breakfast. , Disp: , Rfl:  .  torsemide (DEMADEX) 20 MG tablet, Take 1 tablet (20 mg total) by mouth 2 (two) times daily., Disp: 60 tablet, Rfl: 2 .  HYDROcodone-acetaminophen (NORCO) 10-325 MG tablet, Take 1 tablet by mouth every 8 (eight) hours as needed for up to 5 days., Disp: 15 tablet, Rfl: 0  Social History   Tobacco Use  Smoking Status Never Smoker  Smokeless Tobacco Never Used    Allergies  Allergen Reactions  . Chlorthalidone Other (See Comments)    "Makes me light-headed and I don't like the way it makes me feel"   Objective:  There were no vitals filed for this visit. There is no height or weight on file to calculate BMI. Constitutional Well developed. Well nourished.  Vascular Foot warm and well perfused. Capillary refill normal to all digits.   Neurologic Normal speech. Oriented to person, place, and time. Epicritic sensation to light touch grossly present bilaterally.  Dermatologic  right hallux amputation site scab formation noted but primarily reepithelialized. Left foot surgical incision has also completely epithelialized.  Good correction alignment noted.  Left foot lateral ulceration with fat layer exposed.  Does not probe down to bone.  Probing to deep tissue.  No redness noted.  No other clinical signs of infection noted.  No purulent drainage noted.  Orthopedic: Tenderness to palpation noted about the surgical site.   Radiographs: 3 views of skeletally mature adult bilateral foot: Hallux amputation site noted to the right side.  No signs of osteomyelitis noted.  Left second metatarsal osteotomy noted appears to be floating well.  Appears to be in good correction alignment. Assessment:   1. Status post amputation of great toe, right ([place])   2. Plantar flexed metatarsal bone of left foot    Plan:  Patient was evaluated  and treated and all questions answered.  Left foot fifth metatarsal head ulcer fat layer exposed  -This is a new ulceration that became apparent after he underwent going back into regular shoes ever converses.  Patient states he will get himself back into surgical shoe.  He also states that he has been doing Betadine wet to dressing to that ulcer as well. -Continue Betadine wet-to-dry dressing changes to the ulceration -Wear surgical shoe to the left side.- -I will continue to monitor the progression of the wound if there is any regression patient may end up with an amputation I discussed this with the patient in extensive detail.  He states understanding  Left submetatarsal two wound limited to the breakdown of the skin -Clinically healed   Ulcer right foot also with fat layer exposed status post dehiscence from hallux amputation -Appears to be reepithelializing.  Small scab formation noted.  Overall no ulceration noted at this time.  It seems like patient may have healed the ulceration.   Procedure: Excisional Debridement of Wound Tool: Sharp chisel blade/tissue nipper Rationale: Removal of non-viable soft tissue from the wound to promote healing.  Anesthesia: none Pre-Debridement Wound Measurements: 1.3 cm x 1.1 cm x 0.4 cm  Post-Debridement Wound Measurements: 1.4 cm x 1.2 cm x 0.5 cm  Type of Debridement: Sharp Excisional Tissue Removed: Non-viable soft tissue Blood loss: Minimal (<50cc) Depth of Debridement: subcutaneous tissue. Technique: Sharp excisional debridement to bleeding, viable wound base.  Wound Progress: This is my initial evaluation for this wound.  We will continue monitor progression of it. Site healing conversation 7 Dressing: Dry, sterile, compression dressing. Disposition: Patient tolerated procedure well. Patient to return in 1 week for follow-up.  No follow-ups on file.     No follow-ups on file.     No follow-ups on file.

## 2020-03-04 NOTE — Telephone Encounter (Signed)
done

## 2020-03-04 NOTE — Telephone Encounter (Signed)
Patient called in requesting prescription for Hydrocodone, stated he was suppose to pick up yesterday for pain but pharmacy stated it hasn't been called in yet, please advise

## 2020-03-04 NOTE — Addendum Note (Signed)
Addended by: Boneta Lucks on: 03/04/2020 09:47 AM   Modules accepted: Orders

## 2020-03-05 MED ORDER — HYDROCODONE-ACETAMINOPHEN 10-325 MG PO TABS
1.0000 | ORAL_TABLET | Freq: Three times a day (TID) | ORAL | 0 refills | Status: AC | PRN
Start: 2020-03-05 — End: 2020-03-10

## 2020-03-09 ENCOUNTER — Telehealth: Payer: Self-pay

## 2020-03-09 NOTE — Telephone Encounter (Signed)
Patient contacted nurse line with concerns for pain medications. Patient reports having issues with receiving pain medication and he will run out tomorrow.   Contacted pharmacist, who advised that patient contact Dr. Vira Blanco (pain management provider) for further refills for break through pain. (Per Dr. Posey Pronto).   Called patient and informed of above. Patient will reach out to Dr. Jodene Nam office.   Talbot Grumbling, RN

## 2020-03-09 NOTE — Telephone Encounter (Signed)
Patient returns call to nurse line regarding pain management. Patient states that he left a VM with Dr. Jodene Nam office requesting them to contact him. Advised patient that there is little we can do at our office as he is already seeing a specialist for pain.  Patient is requesting that Dr. Erin Hearing talk to Dr. Vira Blanco regarding pain issues.   Contact number for office is [phone]  Talbot Grumbling, RN

## 2020-03-10 ENCOUNTER — Other Ambulatory Visit: Payer: Self-pay | Admitting: Cardiology

## 2020-03-10 ENCOUNTER — Telehealth: Payer: Self-pay

## 2020-03-10 NOTE — Telephone Encounter (Signed)
Pt called today extremely upset about going back and forth calling our office/ speaking with Pain Management. The pt states that he need pain medication refill. Pain management told the pt to contact his provider in order to get a refill. Please advise.

## 2020-03-12 ENCOUNTER — Encounter: Payer: Medicare Other | Admitting: Podiatry

## 2020-03-12 ENCOUNTER — Telehealth: Payer: Self-pay | Admitting: Podiatry

## 2020-03-12 MED ORDER — HYDROCODONE-ACETAMINOPHEN 5-325 MG PO TABS: 1.0000 | ORAL_TABLET | Freq: Four times a day (QID) | ORAL | 0 refills | Status: DC | PRN

## 2020-03-12 NOTE — Telephone Encounter (Signed)
Pain  medication was sent to the pharmacy

## 2020-03-17 DIAGNOSIS — Z8546 Personal history of malignant neoplasm of prostate: Secondary | ICD-10-CM | POA: Diagnosis not present

## 2020-03-17 DIAGNOSIS — M069 Rheumatoid arthritis, unspecified: Secondary | ICD-10-CM | POA: Diagnosis not present

## 2020-03-17 DIAGNOSIS — E291 Testicular hypofunction: Secondary | ICD-10-CM | POA: Diagnosis not present

## 2020-03-17 DIAGNOSIS — M25551 Pain in right hip: Secondary | ICD-10-CM | POA: Diagnosis not present

## 2020-03-17 DIAGNOSIS — Z79891 Long term (current) use of opiate analgesic: Secondary | ICD-10-CM | POA: Diagnosis not present

## 2020-03-17 DIAGNOSIS — M79673 Pain in unspecified foot: Secondary | ICD-10-CM | POA: Diagnosis not present

## 2020-03-17 DIAGNOSIS — G894 Chronic pain syndrome: Secondary | ICD-10-CM | POA: Diagnosis not present

## 2020-03-17 DIAGNOSIS — Z79899 Other long term (current) drug therapy: Secondary | ICD-10-CM | POA: Diagnosis not present

## 2020-03-17 NOTE — Progress Notes (Signed)
CARDIOLOGY OFFICE NOTE  Date:  03/31/2020    Thomas [name] Date of Birth: [birth year] Medical Record #[id number]  PCP:  [name], MD  Cardiologist:  Johney Frame   Chief Complaint  Patient presents with  . Follow-up    Seen for Dr. Johney Frame    History of Present Illness: Thomas [name] is a 69 y.o. male who presents today for a TOC - however no phone call noted - seen for Dr. Johney Frame.   He has a history of HTN, RA, prostate cancer, and atrial flutter who was referred for evaluation of Atrial flutter with worsening LE edema.  Seen here in October - noted history of atrial flutter with RVR - previously on Cardizem - stopped due to swelling. Transitioned over to Metoprolol. BNP 900. Noted progressive shortness of breath. Has had multiple ER visits for atrial flutter with RVR - no anticoagulation due to concern for falls. He has had LHC and prior TEE guided DCCV - has been started on amiodarone and subsequently now on Eliquis.   Seen in the AF clinic in late October.   Comes in today. Here alone. He says he feels the "same". Says he has had bilateral foot surgery this past Friday night- unclear if this was on Eliquis or not - he does not know. He has a PICC line in place - getting antibiotics - sounds like partial amputation on the right and then on the left "just cut on". This was his second surgery apparently due to infection. To follow back up with [facility] later this week - he is in bilateral orthopedic boots/shoes. Not short of breath. No swelling. No chest pain. Not dizzy or lightheaded. No palpitations. He reports a 70 pound weight loss over the past one year.  He has not taken second dose of diuretics for the most part. Previously on Lisinopril 20 mg a day. BP in the 120's at home. On chronic prednisone for his RA. No bleeding. Labs from last week noted.   Past Medical History:  Diagnosis Date  . Atrial flutter ([place]) 01/2020  . Cancer [facility])    prostate  .  Claustrophobia    quite severe  . Heart failure with reduced ejection fraction ([place])   . Hypertension   . Hypoglycemia    occ  . Left knee DJD    Xray 12/23/08  . NICM (nonischemic cardiomyopathy) ([place]) 02/12/2020  . Prostate cancer [facility])     Past Surgical History:  Procedure Laterality Date  . AMPUTATION TOE Right 12/2019  . AMPUTATION TOE Right 03/26/2020   Procedure: Right 3rd toe partial amputation, bone biopsy right 1st metatarsal;  Surgeon: Evelina Bucy, DPM;  Location: [facility];  Service: Podiatry;  Laterality: Right;  . BUBBLE STUDY  02/11/2020   Procedure: BUBBLE STUDY;  Surgeon: Geralynn Rile, MD;  Location: [place];  Service: Cardiovascular;;  . CARDIOVERSION N/A 02/11/2020   Procedure: CARDIOVERSION;  Surgeon: Geralynn Rile, MD;  Location: [place];  Service: Cardiovascular;  Laterality: N/A;  . CYSTOSCOPY  04/11/2018   Procedure: Erlene Quan;  Surgeon: [name], MD;  Location: [facility];  Service: Urology;;  NO SEEDS FOUND IN BLADDER  . HERNIA REPAIR  2009   inguinal  . IRRIGATION AND DEBRIDEMENT FOOT Left 03/26/2020   Procedure: Left foot incision and drainage with removal of all non-viable soft tissue and bone - areas overlying the 2nd and 5th metatarsals.;  Surgeon: Evelina Bucy, DPM;  Location: [facility];  Service: Podiatry;  Laterality: Left;  . JOINT REPLACEMENT Bilateral 2017   knees  . RADIOACTIVE SEED IMPLANT N/A 04/11/2018   Procedure: RADIOACTIVE SEED IMPLANT/BRACHYTHERAPY IMPLANT;  Surgeon: [name], MD;  Location: [facility];  Service: Urology;  Laterality: N/A;   71     SEEDS IMPLANTED  . RIGHT/LEFT HEART CATH AND CORONARY ANGIOGRAPHY N/A 02/09/2020   Procedure: RIGHT/LEFT HEART CATH AND CORONARY ANGIOGRAPHY;  Surgeon: Nelva Bush, MD;  Location: [place] [facility];  Service: Cardiovascular;  Laterality: N/A;  . SPACE OAR INSTILLATION N/A 04/11/2018   Procedure:  SPACE OAR INSTILLATION;  Surgeon: [name], MD;  Location[facility];  Service: Urology;  Laterality: N/A;  . TEE WITHOUT CARDIOVERSION N/A 02/11/2020   Procedure: TRANSESOPHAGEAL ECHOCARDIOGRAM (TEE);  Surgeon: Geralynn Rile, MD;  Location: [place];  Service: Cardiovascular;  Laterality: N/A;  . TOTAL KNEE ARTHROPLASTY Bilateral 09/13/2015   Procedure: BILATERAL KNEE ARTHROPLASTY ;  Surgeon: Paralee Cancel, MD;  Location: [facility];  Service: Orthopedics;  Laterality: Bilateral;     Medications: Current Meds  Medication Sig  . apixaban (ELIQUIS) 5 MG TABS tablet Take 1 tablet (5 mg total) by mouth 2 (two) times daily.  Marland Kitchen atorvastatin (LIPITOR) 40 MG tablet Take 1 tablet (40 mg total) by mouth daily.  Marland Kitchen ceFAZolin (ANCEF) IVPB Inject 2 g into the vein every 8 (eight) hours. Indication:  Osteomyelitis First Dose: No Last Day of Therapy:  05/08/2020 Labs - Once weekly:  CBC/D and BMP, Labs - Every other week:  ESR and CRP Method of administration: IV Push Method of administration may be changed at the discretion of home infusion pharmacist based upon assessment of the patient and/or caregiver's ability to self-administer the medication ordered.  . DULoxetine (CYMBALTA) 60 MG capsule Take 1 capsule (60 mg total) by mouth daily.  Scarlette Shorts SURECLICK 50 MG/ML injection Inject 25 mg into the skin once a week.   . Melatonin 10 MG TABS Take 10 mg by mouth at bedtime.  . methotrexate (RHEUMATREX) 2.5 MG tablet Take 20 mg by mouth once a week.   Marland Kitchen NARCAN 4 MG/0.1ML LIQD nasal spray kit Place 1 spray into the nose as needed (as directed for emergency).  Marland Kitchen oxyCODONE (ROXICODONE) 15 MG immediate release tablet Take 1 tablet (15 mg total) by mouth every 4 (four) hours as needed for pain.  Marland Kitchen oxyCODONE-acetaminophen (PERCOCET) 10-325 MG tablet Take 1 tablet by mouth every 4 (four) hours.   . potassium chloride SA (KLOR-CON M20) 20 MEQ tablet Take 2 tablets (40 mEq total) by  mouth daily.  . predniSONE (DELTASONE) 10 MG tablet Take 10 mg by mouth daily with breakfast.   . tamsulosin (FLOMAX) 0.4 MG CAPS capsule Take 0.4 mg by mouth daily.   Marland Kitchen torsemide (DEMADEX) 20 MG tablet Take 1 tablet (20 mg total) by mouth daily.  . [DISCONTINUED] amiodarone (PACERONE) 200 MG tablet Take 1 tablet (200 mg total) by mouth 2 (two) times daily.  . [DISCONTINUED] apixaban (ELIQUIS) 5 MG TABS tablet Take 1 tablet (5 mg total) by mouth 2 (two) times daily.  . [DISCONTINUED] atorvastatin (LIPITOR) 40 MG tablet Take 1 tablet (40 mg total) by mouth daily.  . [DISCONTINUED] KLOR-CON M20 20 MEQ tablet TAKE 2 TABLETS BY MOUTH 2 TIMES DAILY.  . [DISCONTINUED] torsemide (DEMADEX) 20 MG tablet Take 1 tablet (20 mg total) by mouth 2 (two) times daily.     Allergies: Allergies  Allergen Reactions  . Chlorthalidone Other (See Comments)    "Makes me light-headed and I don't like the way it makes me feel"  . Voltaren [Diclofenac] Rash    Social History: The patient  reports that he has never smoked. He has never used smokeless tobacco. He reports previous drug use. He reports that he does not drink alcohol.   Family History: The patient's family history includes Prostate cancer in his brother and father.   Review of Systems: Please see the history of present illness.   All other systems are reviewed and negative.   Physical Exam: VS:  BP 130/70   Pulse 63   Ht '6\' 7"'  (2.007 m)   Wt 193 lb (87.5 kg)   SpO2 98%   BMI 21.74 kg/m  .  BMI Body mass index is 21.74 kg/m.  Wt Readings from Last 3 Encounters:  03/31/20 193 lb (87.5 kg)  03/28/20 189 lb 1.6 oz (85.8 kg)  03/24/20 193 lb 3.2 oz (87.6 kg)    General: Alert and in no acute distress.  Little flat affect but appropriate.  Cardiac: Regular rate and rhythm. No murmurs, rubs, or gallops. No edema.  Respiratory:  Lungs are clear to auscultation bilaterally with normal work of breathing.  GI: Soft and nontender.  MS: No  deformity or atrophy. Gait and ROM intact. He shuffles because of his orthopedic shoes he has on bilaterally.  Skin: Warm and dry. Color is normal.  Neuro:  Strength and sensation are intact and no gross focal deficits noted.  Psych: Alert, appropriate and with normal affect.   LABORATORY DATA:  EKG:  EKG is ordered today.  Personally reviewed by me. This demonstrates NSR with 1st degree AV block. HR is 63.   Lab Results  Component Value Date   WBC 5.1 03/28/2020   HGB 10.9 (L) 03/28/2020   HCT 35.7 (L) 03/28/2020   PLT 279 03/28/2020   GLUCOSE 98 03/26/2020   CHOL 111 02/04/2020   TRIG 72 02/04/2020   HDL 32 (L) 02/04/2020   LDLDIRECT 94 12/06/2010   LDLCALC 65 02/04/2020   ALT 15 03/26/2020   AST 13 (L) 03/26/2020   NA 136 03/26/2020   K 3.9 03/26/2020   CL 99 03/26/2020   CREATININE 0.94 03/28/2020   BUN 18 03/26/2020   CO2 28 03/26/2020   TSH 5.337 (H) 02/03/2020   PSA 4.22 (H) 10/07/2012   INR 1.3 (H) 03/25/2020   HGBA1C 6.2 (H) 02/03/2020       BNP (last 3 results) Recent Labs    12/25/19 1706  BNP 901.9*    ProBNP (last 3 results) No results for input(s): PROBNP in the last 8760 hours.   Other Studies Reviewed Today:  KEY FINDINGS TEE/DCCV 1020/2021:  1. LVEF 10%.  2. No LAA thrombus.  3. Small PFO with L to R shunting. 4. Severe RV failure.  5. Full Report to follow.   CARDIOVERSION:     Indications:  Symptomatic Atrial Fibrillation  Procedure Details:  Once the TEE was complete, the patient had the defibrillator pads placed in the anterior and posterior position. Once an appropriate level of sedation was confirmed, the patient was cardioverted x 1 with 120J of biphasic synchronized energy.  The patient converted to NSR.  There were no apparent complications.  The patient had normal neuro status and respiratory status post procedure with vitals stable as recorded elsewhere.  Adequate airway was maintained throughout and vital signs  monitored per protocol.  [place] T. Audie Box, [place]    TEE IMPRESSIONS 01/2020 1. Severely reduced LV function, LVEF~10-15%. LVOT VTI ~5.5 cm. Left  ventricular ejection fraction, by estimation, is 10-15%. The left  ventricle has severely decreased function. The left ventricle demonstrates  global hypokinesis. The left ventricular  internal cavity size was moderately to severely dilated.  2. Evidence of atrial level shunting detected by color flow Doppler.  Agitated saline contrast bubble study was positive with shunting observed  within 3-6 cardiac cycles suggestive of interatrial shunt. There is a  small patent foramen ovale with  predominantly left to right shunting across the atrial septum.  3. Right ventricular systolic function is severely reduced. The right  ventricular size is severely enlarged.  4. Left atrial size was mild to moderately dilated. No left atrial/left  atrial appendage thrombus was detected. The LAA emptying velocity was 21  cm/s.  5. Right atrial size was mildly dilated.  6. The mitral valve is grossly normal. Mild mitral valve regurgitation.  No evidence of mitral stenosis.  7. Tricuspid valve regurgitation is mild to moderate.  8. The aortic valve is tricuspid. Aortic valve regurgitation is trivial.  No aortic stenosis is present.     RIGHT/LEFT HEART CATH AND CORONARY ANGIOGRAPHY 01/2020  Conclusion  Conclusions: 1. No angiographically significant coronary artery disease. 2. Upper normal left heart, right heart, and pulmonary artery pressures. 3. Mildly reduced Fick cardiac output/index.  Recommendations: 1. Restart heparin infusion 2 hours after TR band removal.  If there is no evidence of bleeding/vascular complication, consider transitioning back to apixaban as soon as tomorrow. 2. Proceed with TEE/cardioversion as soon as tomorrow.  I will make Mr. Wiedeman NPO after midnight in anticipation of  this. 3. Optimize evidence-based heart failure therapy for non-ischemic cardiomyopathy. 4. Primary prevention of coronary artery disease.  Nelva Bush, MD   ECHO IMPRESSIONS 01/2020  1. While there is no visualized LV thrombus on sweeps of the apex,  patient did not have IV access, unable to use echo contrast for  confirmation. Left ventricular ejection fraction, by estimation, is <20%.  The left ventricle has severely decreased  function. The left ventricle demonstrates global hypokinesis. The left  ventricular internal cavity size was moderately dilated. Left ventricular  diastolic function could not be evaluated.  2. Right ventricular systolic function is normal. The right ventricular  size is mildly enlarged. There is mildly elevated pulmonary artery  systolic pressure.  3. Left atrial size was severely dilated.  4. Right atrial size was severely dilated.  5. Moderate pleural effusion in both left and right lateral regions.  6. The mitral valve is normal in structure. Mild mitral valve  regurgitation.  7. The aortic valve is tricuspid. There is mild calcification of the  aortic valve. Aortic valve regurgitation is not visualized. Mild aortic  valve sclerosis is present, with no evidence of aortic valve stenosis.  8. The inferior vena cava is dilated in size with <50% respiratory  variability, suggesting right atrial pressure of 15 mmHg.    Assessment and Plan:  1. Prior atrial flutter - remains in sinus - has had prior cardioversion - remains on Amiodarone BID - will cut this back today to just QD dosing.   2. Chronic anticoagulation - CHADSVASC of at least 3 - no bleeding noted.   3. Recent bilateral foot surgery - sounds like he may have osteo - has PICC in place with plans for long term antibiotics.   4. NICM  with chronic systolic HF - hopefully this was tachycardia mediated - would however like to restart low dose ACE - restarting Lisinopril 10 mg a day.  BMET in about 10 days. He is not short of breath. Has had marked weight loss. Cutting diuretics and potassium back today. Will get limited echo in about 6 weeks to recheck his EF.   5. High risk medicine - needs TSH today.   6. Marked weight loss - unclear if this has been worked up or etiology.   7. HTN - BP better - still normal - restarting low dose ACE today for his CHF.    Current medicines are reviewed with the patient today.  The patient does not have concerns regarding medicines other than what has been noted above.  The following changes have been made:  See above.  Labs/ tests ordered today include:    Orders Placed This Encounter  Procedures  . TSH  . Basic metabolic panel  . EKG 12-Lead  . ECHOCARDIOGRAM LIMITED     Disposition:   FU with Dr. Johney Frame in about 2 months. Plan as outlined above.    Patient is agreeable to this plan and will call if any problems develop in the interim.   SignedTruitt Merle, NP  03/31/2020 8:40 AM  [facility] [address] [place], [place]  [address] Phone: [phone] Fax: [phone]

## 2020-03-22 ENCOUNTER — Telehealth: Payer: Self-pay

## 2020-03-22 NOTE — Telephone Encounter (Signed)
Pt has a new ulcer that is forming on his foot along with the old one. Pt would like to know if he need to come in sooner to be seen. Please advise

## 2020-03-23 ENCOUNTER — Other Ambulatory Visit: Payer: Self-pay

## 2020-03-23 DIAGNOSIS — N5201 Erectile dysfunction due to arterial insufficiency: Secondary | ICD-10-CM | POA: Diagnosis not present

## 2020-03-23 DIAGNOSIS — N401 Enlarged prostate with lower urinary tract symptoms: Secondary | ICD-10-CM | POA: Diagnosis not present

## 2020-03-23 DIAGNOSIS — E291 Testicular hypofunction: Secondary | ICD-10-CM | POA: Diagnosis not present

## 2020-03-23 DIAGNOSIS — R351 Nocturia: Secondary | ICD-10-CM | POA: Diagnosis not present

## 2020-03-23 NOTE — Telephone Encounter (Signed)
Thank you :)

## 2020-03-23 NOTE — Telephone Encounter (Signed)
Pt has been scheduled.  °

## 2020-03-23 NOTE — Telephone Encounter (Signed)
Please get patient in tomorrow please. Thanks

## 2020-03-23 NOTE — Telephone Encounter (Signed)
Yes have him come earlier for to be evaluated

## 2020-03-24 ENCOUNTER — Ambulatory Visit: Payer: Medicare Other | Admitting: Family Medicine

## 2020-03-24 ENCOUNTER — Encounter: Payer: Self-pay | Admitting: Podiatry

## 2020-03-24 ENCOUNTER — Encounter: Payer: Self-pay | Admitting: Family Medicine

## 2020-03-24 ENCOUNTER — Ambulatory Visit: Payer: Medicare Other | Admitting: Podiatry

## 2020-03-24 ENCOUNTER — Other Ambulatory Visit: Payer: Self-pay

## 2020-03-24 ENCOUNTER — Ambulatory Visit (INDEPENDENT_AMBULATORY_CARE_PROVIDER_SITE_OTHER): Payer: Medicare Other

## 2020-03-24 VITALS — BP 125/80 | HR 81 | Ht 79.0 in | Wt 193.2 lb

## 2020-03-24 DIAGNOSIS — Z89411 Acquired absence of right great toe: Secondary | ICD-10-CM

## 2020-03-24 DIAGNOSIS — I5021 Acute systolic (congestive) heart failure: Secondary | ICD-10-CM | POA: Diagnosis not present

## 2020-03-24 DIAGNOSIS — L03119 Cellulitis of unspecified part of limb: Secondary | ICD-10-CM | POA: Diagnosis not present

## 2020-03-24 DIAGNOSIS — Z23 Encounter for immunization: Secondary | ICD-10-CM

## 2020-03-24 DIAGNOSIS — L97522 Non-pressure chronic ulcer of other part of left foot with fat layer exposed: Secondary | ICD-10-CM

## 2020-03-24 DIAGNOSIS — M069 Rheumatoid arthritis, unspecified: Secondary | ICD-10-CM | POA: Diagnosis not present

## 2020-03-24 DIAGNOSIS — R195 Other fecal abnormalities: Secondary | ICD-10-CM | POA: Diagnosis not present

## 2020-03-24 DIAGNOSIS — M216X2 Other acquired deformities of left foot: Secondary | ICD-10-CM

## 2020-03-24 DIAGNOSIS — L98499 Non-pressure chronic ulcer of skin of other sites with unspecified severity: Secondary | ICD-10-CM

## 2020-03-24 DIAGNOSIS — C61 Malignant neoplasm of prostate: Secondary | ICD-10-CM

## 2020-03-24 MED ORDER — DULOXETINE HCL 60 MG PO CPEP
60.0000 mg | ORAL_CAPSULE | Freq: Every day | ORAL | 0 refills | Status: DC
Start: 2020-03-24 — End: 2020-03-24

## 2020-03-24 MED ORDER — DULOXETINE HCL 60 MG PO CPEP
60.0000 mg | ORAL_CAPSULE | Freq: Every day | ORAL | 1 refills | Status: DC
Start: 2020-03-24 — End: 2020-08-20

## 2020-03-24 NOTE — Progress Notes (Signed)
Subjective:  Patient ID: Thomas [name], male    DOB: 02-12-1951,  MRN: [id number]  Chief Complaint  Patient presents with  . Foot Ulcer    pt states that ulcer has developed on right toe.     69 y.o. male returns for post-op check.  Patient presents with complaint of new ulceration to the dorsal part of the left foot as well as the lateral part of the fifth digit.  Patient also has another ulcer on distal aspect of the third digit on the right foot.  Patient states it got progressively worse over the last few days there has been some drainage she has been keeping covered he has been on his foot a lot with work he has not been able to take any time off.  He is worried that he may have gotten very infected.  He wondered if he needs to get admitted for the hospital he denies any other acute complaints.  He has been ambulating with surgical shoe.  Review of Systems: Negative except as noted in the HPI. Denies N/V/F/Ch.  Past Medical History:  Diagnosis Date  . Atrial flutter ([place]) 01/2020  . Cancer [facility])    prostate  . Claustrophobia    quite severe  . Heart failure with reduced ejection fraction ([place])   . Hypertension   . Hypoglycemia    occ  . Left knee DJD    Xray 12/23/08  . NICM (nonischemic cardiomyopathy) ([place]) 02/12/2020  . Prostate cancer [facility])     Current Outpatient Medications:  .  amiodarone (PACERONE) 200 MG tablet, Take 1 tablet (200 mg total) by mouth 2 (two) times daily., Disp: 60 tablet, Rfl: 2 .  apixaban (ELIQUIS) 5 MG TABS tablet, Take 1 tablet (5 mg total) by mouth 2 (two) times daily., Disp: 60 tablet, Rfl: 3 .  atorvastatin (LIPITOR) 40 MG tablet, Take 1 tablet (40 mg total) by mouth daily., Disp: 30 tablet, Rfl: 2 .  DULoxetine (CYMBALTA) 60 MG capsule, Take 1 capsule (60 mg total) by mouth daily., Disp: 90 capsule, Rfl: 1 .  ENBREL SURECLICK 50 MG/ML injection, Inject 25 mg into the skin once a week.  (Patient not taking: Reported on 03/24/2020), Disp: , Rfl:   .  KLOR-CON M20 20 MEQ tablet, TAKE 2 TABLETS BY MOUTH 2 TIMES DAILY., Disp: 180 tablet, Rfl: 3 .  Melatonin 10 MG TABS, Take 10 mg by mouth at bedtime., Disp: , Rfl:  .  methotrexate (RHEUMATREX) 2.5 MG tablet, Take 20 mg by mouth once a week.  (Patient not taking: Reported on 03/24/2020), Disp: , Rfl:  .  NARCAN 4 MG/0.1ML LIQD nasal spray kit, Place 1 spray into the nose as needed (as directed for emergency). (Patient not taking: Reported on 03/24/2020), Disp: 2 each, Rfl: 0 .  oxyCODONE-acetaminophen (PERCOCET) 10-325 MG tablet, Take 1 tablet by mouth every 4 (four) hours as needed., Disp: , Rfl:  .  predniSONE (DELTASONE) 10 MG tablet, Take 10 mg by mouth daily with breakfast. , Disp: , Rfl:  .  tamsulosin (FLOMAX) 0.4 MG CAPS capsule, Take 0.4 mg by mouth daily. (Patient not taking: Reported on 03/24/2020), Disp: , Rfl:  .  torsemide (DEMADEX) 20 MG tablet, Take 1 tablet (20 mg total) by mouth 2 (two) times daily., Disp: 60 tablet, Rfl: 2  Social History   Tobacco Use  Smoking Status Never Smoker  Smokeless Tobacco Never Used    Allergies  Allergen Reactions  . Chlorthalidone Other (See Comments)    "  Makes me light-headed and I don't like the way it makes me feel"   Objective:  There were no vitals filed for this visit. There is no height or weight on file to calculate BMI. Constitutional Well developed. Well nourished.  Vascular Foot warm and well perfused. Capillary refill normal to all digits.   Neurologic Normal speech. Oriented to person, place, and time. Epicritic sensation to light touch grossly present bilaterally.  Dermatologic  right hallux amputation site completely reepithelialized.  No concern for ulceration noted at this time.  Right third digit distal tip ulceration probes down to bone with concern for underlying osteomyelitis.  No purulent drainage was expressed.  There is some swelling present circumferential around the digit.  Mild redness around the wound  itself.  Left dorsal foot sinus tract down to deep tissue.  Mild purulent drainage was expressed there is some redness on the dorsal aspect of the foot concerning for deep abscess.  No fluctuance palpated.  No malodor present.  Left foot lateral ulceration with fat layer exposed.  Does not probe down to bone.  Probing to deep tissue.  No redness noted.  No other clinical signs of infection noted.  No purulent drainage noted.  Orthopedic: Tenderness to palpation noted about the surgical site.   Radiographs: 3 views of skeletally mature adult bilateral foot: Hallux amputation site noted to the right side.  Osteomyelitic changes noted to the right third digit concerning for distal phalanx osteomyelitis.  No osteomyelitis noted to the left fifth metatarsal head.  No osteomyelitic changes noted to the second floating osteotomy site Assessment:   1. Plantar flexed metatarsal bone of left foot   2. Status post amputation of great toe, right ([place])   3. Foot ulcer with fat layer exposed, left ([place])   4. At increased risk for emergency hospital admission    Plan:  Patient was evaluated and treated and all questions answered.  Left foot fifth metatarsal head ulcer fat layer exposed  -This is a new ulceration that became apparent after he underwent going back into regular shoes ever converses.  Patient states he will get himself back into surgical shoe.  He also states that he has been doing Betadine wet to dressing to that ulcer as well. -Continue Betadine wet-to-dry dressing changes to the ulceration -Wear surgical shoe to the l to both lower legs lower extremity  Left submetatarsal two wound limited to the breakdown of the skin -Clinically healed  Left dorsal second sinus tract wound -Betadine wet-to-dry dressing changes purulent drainage was expressed concerning for mild deep abscess formation.  He was sent to emergency room to be admitted for IV antibiotics with MRI of bilateral feet to rule out  abscess/osteomyelitis.  He will need minimum of 48 to 72 hours of IV antibiotics with a possible surgical amputation of the right third digit.  Right third digit distal phalanx osteomyelitis -I explained patient the etiology of osteomyelitis versus treatment options were discussed.  I believe patient will need a surgical amputation however prior to that patient will be admitted for IV antibiotics for her and for MRI to both lower extremity. -Betadine wet-to-dry dressing was applied -He was instructed to go to the emergency room right away for surgical amputation with IV antibiotics and an MRI evaluation.  Patient states understanding.   Ulcer right foot also with fat layer exposed status post dehiscence from hallux amputation -Appears to be reepithelializing.  Small scab formation noted.  Overall no ulceration noted at this time.  It seems like patient may have healed the ulceration.    No follow-ups on file.     No follow-ups on file.     No follow-ups on file.

## 2020-03-24 NOTE — Assessment & Plan Note (Addendum)
Now with ulcers on his feet being followed by podiatry.   Months ago had ulcers excoriations of his arms that resolved.  Check labs and asked him to discuss with his podiatrist and rheumatologist

## 2020-03-24 NOTE — Progress Notes (Signed)
    SUBJECTIVE:   CHIEF COMPLAINT / HPI:   ATRIAL FLUTTER Did not bring in his medications but seems to know them by name.  Reports taking all regularly.  No chest pain or lightheadness or shortness of breath or bleeding.    WEIGHT LOSS Has been stable.  Is eating relatively normal.  Taking diuretic regularly.  Much less edema than when was in hospital  LIPID Taking lipitor regularly   FOOT ULCERs Has follow up with podiatry today.  Had test of lower extremity perfusion that reportedly was normal    PERTINENT  PMH / PSH: Has been off MTX and enbrel for months  OBJECTIVE:   BP 125/80   Pulse 81   Ht 6\' 7"  (2.007 m)   Wt 193 lb 3.2 oz (87.6 kg)   SpO2 99%   BMI 21.76 kg/m   Thin ill appearing Heart - Regular rate and rhythm.  No murmurs, gallops or rubs.    Lungs:  Normal respiratory effort, chest expands symmetrically. Lungs are clear to auscultation, no crackles or wheezes. Extrem - shiny tense edema above ankles - 1+ Much improved from hospitilization  ASSESSMENT/PLAN:   Rheumatoid arthritis ([place]) Off all medications currently.  Has appointment to see rheumatology on 03/24/2020.  Asked him to discuss lower extremity ulcers with them   Heme positive stool He reports having occaisional bloody stools (is on apixiban) Has had history of heme positive stool for years.  Will check labs and strongly encouraged he needs to follow up with GI.     Acute systolic heart failure ([place]) Much improved since hospitalization.  Following with cardiology.  Appears to be in sinus today   Skin ulcer ([place]) Now with ulcers on his feet being followed by podiatry.   Months ago had ulcers excoriations of his arms that resolved.  Check labs and asked him to discuss with his podiatrist and rheumatologist     [name], [place]

## 2020-03-24 NOTE — Assessment & Plan Note (Signed)
Off all medications currently.  Has appointment to see rheumatology on 03/24/2020.  Asked him to discuss lower extremity ulcers with them

## 2020-03-24 NOTE — Patient Instructions (Addendum)
Good to see you today!  Thanks for coming in.  I will call you if your tests are not good.  Otherwise, I will send you a message on MyChart (if it is active) or a letter in the mail..  If you do not hear from me with in 2 weeks please call our office.     For the blood in the stool You need to have a colonoscopy fairly soon once the other problems are more stable

## 2020-03-24 NOTE — Assessment & Plan Note (Signed)
Much improved since hospitalization.  Following with cardiology.  Appears to be in sinus today

## 2020-03-24 NOTE — Assessment & Plan Note (Signed)
He reports having occaisional bloody stools (is on apixiban) Has had history of heme positive stool for years.  Will check labs and strongly encouraged he needs to follow up with GI.

## 2020-03-25 ENCOUNTER — Inpatient Hospital Stay (HOSPITAL_COMMUNITY)
Admission: EM | Admit: 2020-03-25 | Discharge: 2020-03-29 | DRG: 617 | Disposition: A | Discharge status: Home Health | Payer: Medicare Other | Attending: Internal Medicine | Admitting: Internal Medicine

## 2020-03-25 ENCOUNTER — Telehealth: Payer: Self-pay | Admitting: Podiatry

## 2020-03-25 ENCOUNTER — Inpatient Hospital Stay (HOSPITAL_COMMUNITY): Payer: Medicare Other | Admitting: Certified Registered Nurse Anesthetist

## 2020-03-25 ENCOUNTER — Inpatient Hospital Stay (HOSPITAL_COMMUNITY): Payer: Medicare Other

## 2020-03-25 ENCOUNTER — Encounter (HOSPITAL_COMMUNITY)
Admission: EM | Disposition: A | Discharge status: Home Health | Payer: Self-pay | Source: Home / Self Care | Attending: Internal Medicine

## 2020-03-25 ENCOUNTER — Emergency Department (HOSPITAL_BASED_OUTPATIENT_CLINIC_OR_DEPARTMENT_OTHER): Payer: Medicare Other

## 2020-03-25 ENCOUNTER — Encounter (HOSPITAL_COMMUNITY): Payer: Self-pay | Admitting: Internal Medicine

## 2020-03-25 ENCOUNTER — Encounter: Payer: Self-pay | Admitting: Family Medicine

## 2020-03-25 DIAGNOSIS — L0390 Cellulitis, unspecified: Secondary | ICD-10-CM

## 2020-03-25 DIAGNOSIS — F1120 Opioid dependence, uncomplicated: Secondary | ICD-10-CM | POA: Diagnosis present

## 2020-03-25 DIAGNOSIS — M86179 Other acute osteomyelitis, unspecified ankle and foot: Secondary | ICD-10-CM | POA: Diagnosis present

## 2020-03-25 DIAGNOSIS — E1169 Type 2 diabetes mellitus with other specified complication: Secondary | ICD-10-CM | POA: Diagnosis not present

## 2020-03-25 DIAGNOSIS — E11622 Type 2 diabetes mellitus with other skin ulcer: Secondary | ICD-10-CM | POA: Diagnosis present

## 2020-03-25 DIAGNOSIS — Z89411 Acquired absence of right great toe: Secondary | ICD-10-CM | POA: Diagnosis not present

## 2020-03-25 DIAGNOSIS — E11621 Type 2 diabetes mellitus with foot ulcer: Secondary | ICD-10-CM | POA: Diagnosis present

## 2020-03-25 DIAGNOSIS — E785 Hyperlipidemia, unspecified: Secondary | ICD-10-CM | POA: Diagnosis present

## 2020-03-25 DIAGNOSIS — M069 Rheumatoid arthritis, unspecified: Secondary | ICD-10-CM | POA: Diagnosis present

## 2020-03-25 DIAGNOSIS — Z888 Allergy status to other drugs, medicaments and biological substances status: Secondary | ICD-10-CM

## 2020-03-25 DIAGNOSIS — L97529 Non-pressure chronic ulcer of other part of left foot with unspecified severity: Secondary | ICD-10-CM | POA: Diagnosis not present

## 2020-03-25 DIAGNOSIS — L97514 Non-pressure chronic ulcer of other part of right foot with necrosis of bone: Secondary | ICD-10-CM | POA: Diagnosis not present

## 2020-03-25 DIAGNOSIS — E1152 Type 2 diabetes mellitus with diabetic peripheral angiopathy with gangrene: Secondary | ICD-10-CM | POA: Diagnosis present

## 2020-03-25 DIAGNOSIS — S92511A Displaced fracture of proximal phalanx of right lesser toe(s), initial encounter for closed fracture: Secondary | ICD-10-CM | POA: Diagnosis not present

## 2020-03-25 DIAGNOSIS — M86172 Other acute osteomyelitis, left ankle and foot: Secondary | ICD-10-CM | POA: Diagnosis not present

## 2020-03-25 DIAGNOSIS — F40240 Claustrophobia: Secondary | ICD-10-CM | POA: Diagnosis present

## 2020-03-25 DIAGNOSIS — Z79899 Other long term (current) drug therapy: Secondary | ICD-10-CM

## 2020-03-25 DIAGNOSIS — G8929 Other chronic pain: Secondary | ICD-10-CM | POA: Diagnosis present

## 2020-03-25 DIAGNOSIS — Z66 Do not resuscitate: Secondary | ICD-10-CM | POA: Diagnosis present

## 2020-03-25 DIAGNOSIS — I428 Other cardiomyopathies: Secondary | ICD-10-CM | POA: Diagnosis not present

## 2020-03-25 DIAGNOSIS — Z8546 Personal history of malignant neoplasm of prostate: Secondary | ICD-10-CM

## 2020-03-25 DIAGNOSIS — L03116 Cellulitis of left lower limb: Secondary | ICD-10-CM | POA: Diagnosis not present

## 2020-03-25 DIAGNOSIS — L97509 Non-pressure chronic ulcer of other part of unspecified foot with unspecified severity: Secondary | ICD-10-CM | POA: Diagnosis not present

## 2020-03-25 DIAGNOSIS — Z8042 Family history of malignant neoplasm of prostate: Secondary | ICD-10-CM

## 2020-03-25 DIAGNOSIS — M86171 Other acute osteomyelitis, right ankle and foot: Secondary | ICD-10-CM | POA: Diagnosis not present

## 2020-03-25 DIAGNOSIS — M130 Polyarthritis, unspecified: Secondary | ICD-10-CM | POA: Diagnosis present

## 2020-03-25 DIAGNOSIS — L97519 Non-pressure chronic ulcer of other part of right foot with unspecified severity: Secondary | ICD-10-CM | POA: Diagnosis present

## 2020-03-25 DIAGNOSIS — Z452 Encounter for adjustment and management of vascular access device: Secondary | ICD-10-CM | POA: Diagnosis not present

## 2020-03-25 DIAGNOSIS — Z7952 Long term (current) use of systemic steroids: Secondary | ICD-10-CM

## 2020-03-25 DIAGNOSIS — I5022 Chronic systolic (congestive) heart failure: Secondary | ICD-10-CM | POA: Diagnosis not present

## 2020-03-25 DIAGNOSIS — Z96653 Presence of artificial knee joint, bilateral: Secondary | ICD-10-CM | POA: Diagnosis present

## 2020-03-25 DIAGNOSIS — L02611 Cutaneous abscess of right foot: Secondary | ICD-10-CM | POA: Diagnosis not present

## 2020-03-25 DIAGNOSIS — Z9889 Other specified postprocedural states: Secondary | ICD-10-CM

## 2020-03-25 DIAGNOSIS — L02612 Cutaneous abscess of left foot: Secondary | ICD-10-CM | POA: Diagnosis present

## 2020-03-25 DIAGNOSIS — I4892 Unspecified atrial flutter: Secondary | ICD-10-CM | POA: Diagnosis not present

## 2020-03-25 DIAGNOSIS — S92321A Displaced fracture of second metatarsal bone, right foot, initial encounter for closed fracture: Secondary | ICD-10-CM | POA: Diagnosis not present

## 2020-03-25 DIAGNOSIS — M869 Osteomyelitis, unspecified: Secondary | ICD-10-CM

## 2020-03-25 DIAGNOSIS — Z20822 Contact with and (suspected) exposure to covid-19: Secondary | ICD-10-CM | POA: Diagnosis not present

## 2020-03-25 DIAGNOSIS — Z7901 Long term (current) use of anticoagulants: Secondary | ICD-10-CM

## 2020-03-25 DIAGNOSIS — R600 Localized edema: Secondary | ICD-10-CM | POA: Diagnosis not present

## 2020-03-25 DIAGNOSIS — D649 Anemia, unspecified: Secondary | ICD-10-CM | POA: Diagnosis present

## 2020-03-25 DIAGNOSIS — M01X72 Direct infection of left ankle and foot in infectious and parasitic diseases classified elsewhere: Secondary | ICD-10-CM | POA: Diagnosis not present

## 2020-03-25 DIAGNOSIS — M7989 Other specified soft tissue disorders: Secondary | ICD-10-CM | POA: Diagnosis not present

## 2020-03-25 DIAGNOSIS — S91302A Unspecified open wound, left foot, initial encounter: Secondary | ICD-10-CM | POA: Diagnosis not present

## 2020-03-25 DIAGNOSIS — L089 Local infection of the skin and subcutaneous tissue, unspecified: Secondary | ICD-10-CM | POA: Diagnosis not present

## 2020-03-25 DIAGNOSIS — M01X71 Direct infection of right ankle and foot in infectious and parasitic diseases classified elsewhere: Secondary | ICD-10-CM | POA: Diagnosis not present

## 2020-03-25 DIAGNOSIS — M868X7 Other osteomyelitis, ankle and foot: Secondary | ICD-10-CM | POA: Diagnosis not present

## 2020-03-25 DIAGNOSIS — I110 Hypertensive heart disease with heart failure: Secondary | ICD-10-CM | POA: Diagnosis present

## 2020-03-25 DIAGNOSIS — M79672 Pain in left foot: Secondary | ICD-10-CM | POA: Diagnosis not present

## 2020-03-25 DIAGNOSIS — B9561 Methicillin susceptible Staphylococcus aureus infection as the cause of diseases classified elsewhere: Secondary | ICD-10-CM | POA: Diagnosis present

## 2020-03-25 DIAGNOSIS — Z886 Allergy status to analgesic agent status: Secondary | ICD-10-CM

## 2020-03-25 LAB — CBC
Hematocrit: 38.3 % (ref 37.5–51.0)
Hemoglobin: 11.9 g/dL — ABNORMAL LOW (ref 13.0–17.7)
MCH: 25.5 pg — ABNORMAL LOW (ref 26.6–33.0)
MCHC: 31.1 g/dL — ABNORMAL LOW (ref 31.5–35.7)
MCV: 82 fL (ref 79–97)
Platelets: 323 10*3/uL (ref 150–450)
RBC: 4.67 x10E6/uL (ref 4.14–5.80)
RDW: 16.9 % — ABNORMAL HIGH (ref 11.6–15.4)
WBC: 12.6 10*3/uL — ABNORMAL HIGH (ref 3.4–10.8)

## 2020-03-25 LAB — CBC WITH DIFFERENTIAL/PLATELET
Abs Immature Granulocytes: 0.09 10*3/uL — ABNORMAL HIGH (ref 0.00–0.07)
Basophils Absolute: 0 10*3/uL (ref 0.0–0.1)
Basophils Relative: 0 %
Eosinophils Absolute: 0 10*3/uL (ref 0.0–0.5)
Eosinophils Relative: 0 %
HCT: 36.8 % — ABNORMAL LOW (ref 39.0–52.0)
Hemoglobin: 11.5 g/dL — ABNORMAL LOW (ref 13.0–17.0)
Immature Granulocytes: 1 %
Lymphocytes Relative: 4 %
Lymphs Abs: 0.5 10*3/uL — ABNORMAL LOW (ref 0.7–4.0)
MCH: 25.8 pg — ABNORMAL LOW (ref 26.0–34.0)
MCHC: 31.3 g/dL (ref 30.0–36.0)
MCV: 82.7 fL (ref 80.0–100.0)
Monocytes Absolute: 0.7 10*3/uL (ref 0.1–1.0)
Monocytes Relative: 6 %
Neutro Abs: 11.7 10*3/uL — ABNORMAL HIGH (ref 1.7–7.7)
Neutrophils Relative %: 89 %
Platelets: 251 10*3/uL (ref 150–400)
RBC: 4.45 MIL/uL (ref 4.22–5.81)
RDW: 18.8 % — ABNORMAL HIGH (ref 11.5–15.5)
WBC: 13 10*3/uL — ABNORMAL HIGH (ref 4.0–10.5)
nRBC: 0 % (ref 0.0–0.2)

## 2020-03-25 LAB — CMP14+EGFR
ALT: 18 IU/L (ref 0–44)
AST: 17 IU/L (ref 0–40)
Albumin/Globulin Ratio: 1.5 (ref 1.2–2.2)
Albumin: 4.1 g/dL (ref 3.8–4.8)
Alkaline Phosphatase: 156 IU/L — ABNORMAL HIGH (ref 44–121)
BUN/Creatinine Ratio: 18 (ref 10–24)
BUN: 17 mg/dL (ref 8–27)
Bilirubin Total: 0.6 mg/dL (ref 0.0–1.2)
CO2: 30 mmol/L — ABNORMAL HIGH (ref 20–29)
Calcium: 9.1 mg/dL (ref 8.6–10.2)
Chloride: 94 mmol/L — ABNORMAL LOW (ref 96–106)
Creatinine, Ser: 0.92 mg/dL (ref 0.76–1.27)
GFR calc Af Amer: 99 mL/min/{1.73_m2} (ref >59)
GFR calc non Af Amer: 86 mL/min/{1.73_m2} (ref >59)
Globulin, Total: 2.7 g/dL (ref 1.5–4.5)
Glucose: 92 mg/dL (ref 65–99)
Potassium: 4.5 mmol/L (ref 3.5–5.2)
Sodium: 139 mmol/L (ref 134–144)
Total Protein: 6.8 g/dL (ref 6.0–8.5)

## 2020-03-25 LAB — COMPREHENSIVE METABOLIC PANEL
ALT: 17 U/L (ref 0–44)
AST: 18 U/L (ref 15–41)
Albumin: 3.6 g/dL (ref 3.5–5.0)
Alkaline Phosphatase: 108 U/L (ref 38–126)
Anion gap: 11 (ref 5–15)
BUN: 18 mg/dL (ref 8–23)
CO2: 29 mmol/L (ref 22–32)
Calcium: 8.8 mg/dL — ABNORMAL LOW (ref 8.9–10.3)
Chloride: 94 mmol/L — ABNORMAL LOW (ref 98–111)
Creatinine, Ser: 0.79 mg/dL (ref 0.61–1.24)
GFR, Estimated: >60 mL/min (ref >60)
Glucose, Bld: 132 mg/dL — ABNORMAL HIGH (ref 70–99)
Potassium: 4.6 mmol/L (ref 3.5–5.1)
Sodium: 134 mmol/L — ABNORMAL LOW (ref 135–145)
Total Bilirubin: 1 mg/dL (ref 0.3–1.2)
Total Protein: 7.4 g/dL (ref 6.5–8.1)

## 2020-03-25 LAB — RESP PANEL BY RT-PCR (FLU A&B, COVID) ARPGX2
Influenza A by PCR: NEGATIVE
Influenza B by PCR: NEGATIVE
SARS Coronavirus 2 by RT PCR: NEGATIVE

## 2020-03-25 LAB — PROTIME-INR
INR: 1.3 — ABNORMAL HIGH (ref 0.8–1.2)
Prothrombin Time: 15.2 seconds (ref 11.4–15.2)

## 2020-03-25 LAB — APTT
aPTT: 53 seconds — ABNORMAL HIGH (ref 24–36)
aPTT: 68 seconds — ABNORMAL HIGH (ref 24–36)

## 2020-03-25 LAB — LACTIC ACID, PLASMA: Lactic Acid, Venous: 1.4 mmol/L (ref 0.5–1.9)

## 2020-03-25 LAB — HEPARIN LEVEL (UNFRACTIONATED): Heparin Unfractionated: 1.84 IU/mL — ABNORMAL HIGH (ref 0.30–0.70)

## 2020-03-25 IMAGING — MR MR FOOT*R* WO/W CM
9 series · 40 of 40 positions shown · IV contrast (gadavist)
Comparison: Multiple exams, including radiographs from [DATE]

CLINICAL DATA: Diabetes, foot swelling. Prior amputation of the
great toe. Ulcer along the third toe.

EXAM:
MRI OF THE RIGHT FOREFOOT WITHOUT AND WITH CONTRAST
TECHNIQUE: Multiplanar, multisequence MR imaging of the right forefoot was
performed before and after the administration of intravenous
contrast.
CONTRAST:  9mL GADAVIST GADOBUTROL 1 MMOL/ML IV SOLN

[Series 3: T2 fat-sat · axial · right · 3.0mm · 0.55mm/px · z∈[-77,-14]mm · 4 of 20 slices shown (1 of 2)]
[im 1/20]
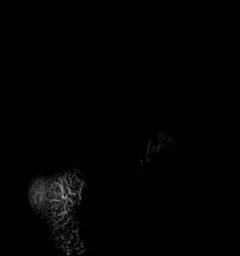
[im 7/20]
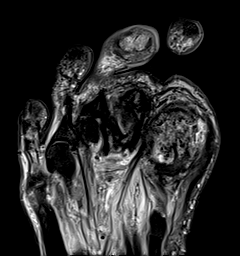
[im 13/20]
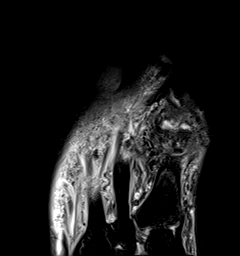
[im 20/20]
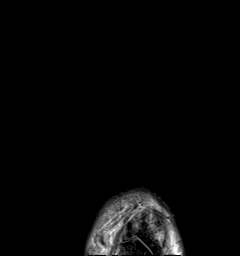

[Series 4: T1 · axial · right · 3.0mm · 0.55mm/px · z∈[-77,-14]mm · 3 of 20 slices shown (1 of 2)]
[im 1/20]
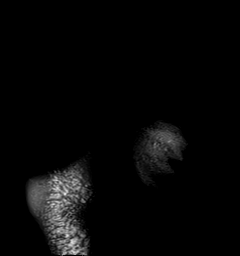
[im 10/20]
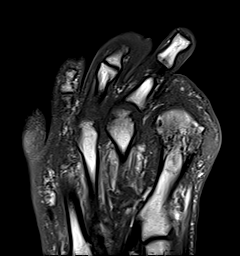
[im 20/20]
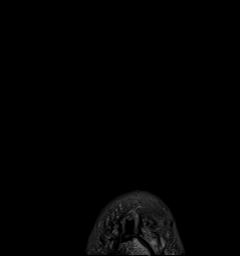

[Series 5: T1 · coronal · right · 3.0mm · 0.55mm/px · 5 of 35 slices shown (2 of 2)]
[im 1/35]
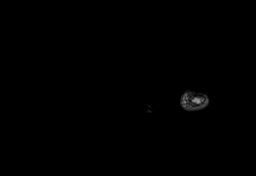
[im 9/35]
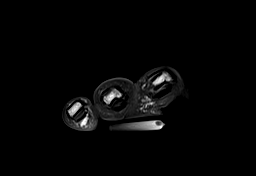
[im 18/35]
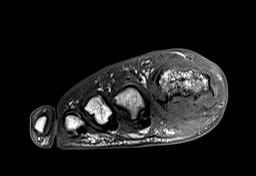
[im 26/35]
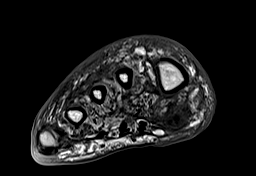
[im 35/35]
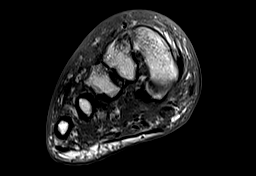

[Series 6: T2 fat-sat · coronal · right · 3.0mm · 0.44mm/px · 5 of 35 slices shown (2 of 2)]
[im 1/35]
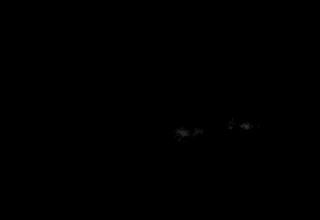
[im 9/35]
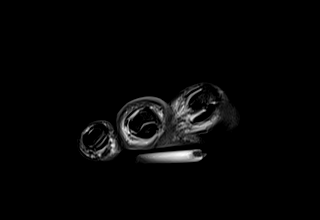
[im 18/35]
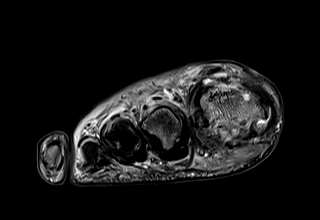
[im 26/35]
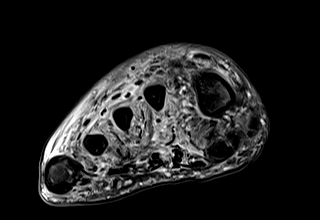
[im 35/35]
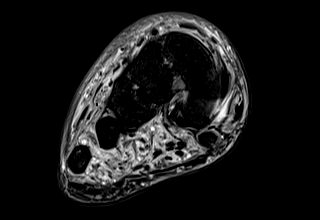

[Series 7: STIR · sagittal · right · 3.0mm · 0.27mm/px · 5 of 32 slices shown]
[im 1/32]
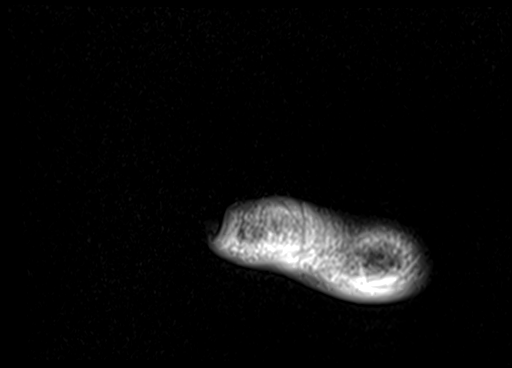
[im 8/32]
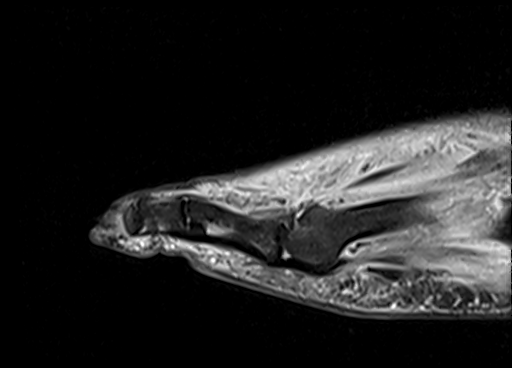
[im 16/32]
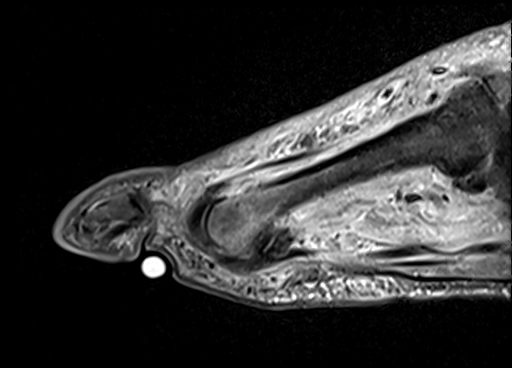
[im 24/32]
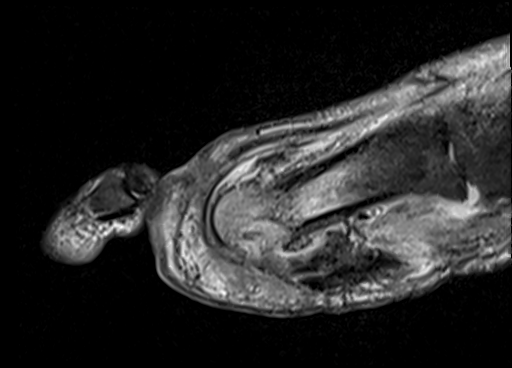
[im 32/32]
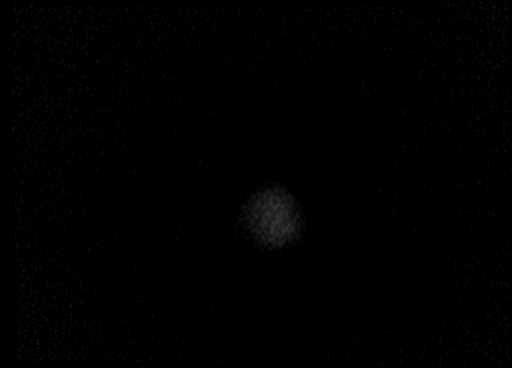

[Series 8: T1 fat-sat · coronal · non-contrast · right · 3.0mm · 0.55mm/px · 5 of 35 slices shown]
[im 1/35]
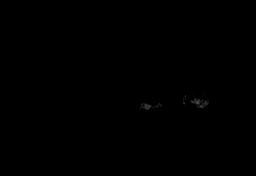
[im 9/35]
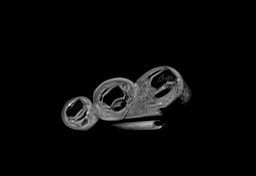
[im 18/35]
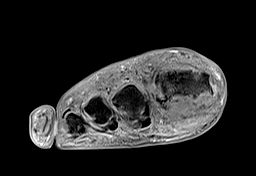
[im 26/35]
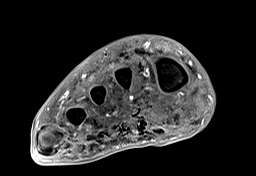
[im 35/35]
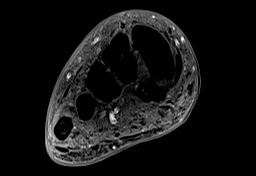

[Series 9: T1 fat-sat post-contrast · coronal · right · 3.0mm · 0.55mm/px · 5 of 35 slices shown (1 of 3)]
[im 1/35]
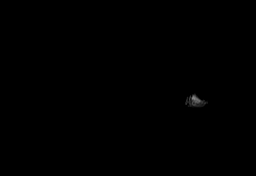
[im 9/35]
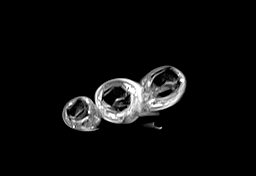
[im 18/35]
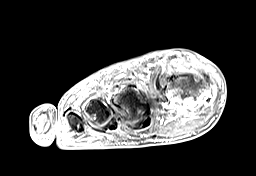
[im 26/35]
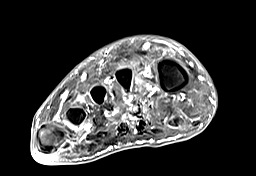
[im 35/35]
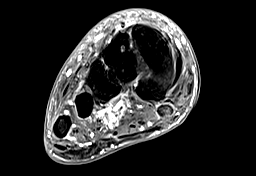

[Series 10: T1 fat-sat post-contrast · sagittal · right · 3.0mm · 0.27mm/px · 5 of 32 slices shown (2 of 3)]
[im 1/32]
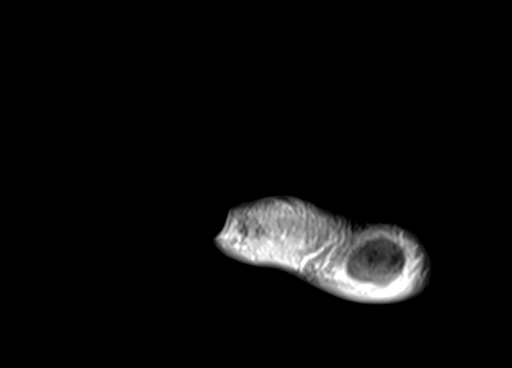
[im 8/32]
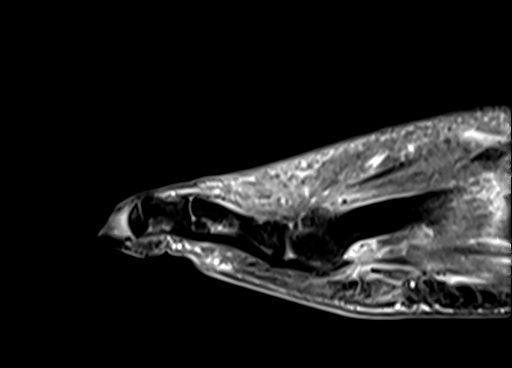
[im 16/32]
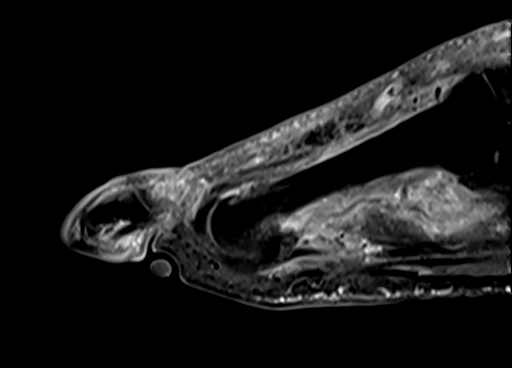
[im 24/32]
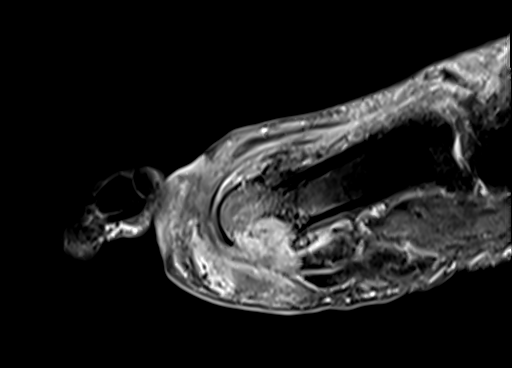
[im 32/32]
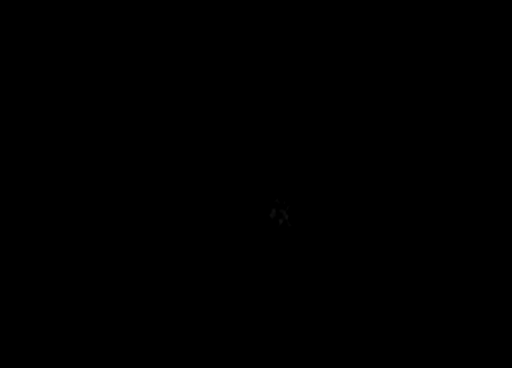

[Series 11: T1 fat-sat post-contrast · axial · right · 3.0mm · 0.44mm/px · z∈[-77,-14]mm · 3 of 20 slices shown (3 of 3)]
[im 1/20]
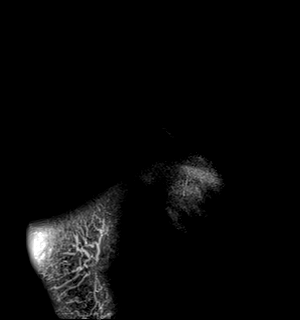
[im 10/20]
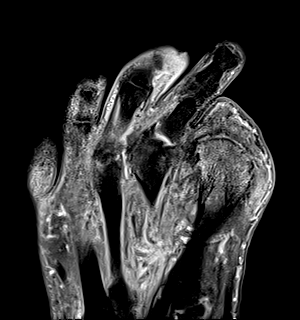
[im 20/20]
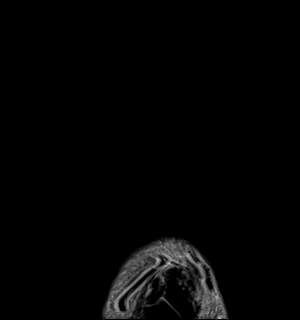

[40 of 40 positions shown; findings below may reference images not displayed]

FINDINGS: Bones/Joint/Cartilage

Absent great toe. 1.3 by 1.7 cm erosion along the plantar head of
the first metatarsal with a notable surrounding edema in the
metatarsal head and distal shaft, suspicious for active
osteomyelitis for example on image 26 of series 7. Nonvisualization
of the medial first digit sesamoid, probably eroded. There is edema
in the lateral sesamoid of the first digit.

Prominent edema and enhancement in the distal phalanx of the third
toe. Possible midshaft fracture versus bony destruction. Surrounding
subcutaneous edema and enhancement in the third toe. Mild deformity
of the distal metaphysis of the proximal phalanx third toe likely
from an old fracture.

Low-grade marrow edema is present in the head of the second
metatarsal.

Abnormal marrow edema and enhancement is present in the proximal,
middle, and distal phalanges of the small toe with associated
subcutaneous edema and enhancement. Possible fracture involving the
distal head of the proximal phalanx of the small toe. Appearance
concerning for osteomyelitis of the small toe.

Ligaments

Lisfranc ligament intact.

Muscles and Tendons

Diffuse low-grade edema and enhancement in the visualized
musculature of the forefoot, potentially from myositis or neurogenic
edema. Thickening of the detached flexor hallucis longus tendon.

Soft tissues

Extensive edema and enhancement tracking in the dorsum of the foot
and in the remaining toes, favoring cellulitis. I not observe a
drainable abscess. There is some hypoenhancement in the plantar
tissues along the ball of the foot below the second and third MTP
joints, without visualized gas in the soft tissues, correlate with
visual inspection in assessing for tissue necrosis.
IMPRESSION: 1. Osteomyelitis of the head and distal shaft of the first
metatarsal.
2. Prominent edema and enhancement in the distal phalanx of the
third toe, favoring osteomyelitis, with possible midshaft fracture
versus bony destruction.
3. Abnormal marrow edema and enhancement in the proximal, middle,
and distal phalanges of the small toe, with associated subcutaneous
edema and enhancement. Appearance favors osteomyelitis. Possible
fracture involving the distal head of the proximal phalanx of the
small toe.
4. Extensive edema and enhancement in the dorsum of the foot and in
the remaining toes, favoring cellulitis. No drainable abscess.
5. Diffuse low-grade edema and enhancement in the visualized
musculature of the forefoot, potentially from myositis or neurogenic
edema.
6. Thickening of the detached flexor hallucis longus tendon,
compatible with prior amputation.
7. There is some hypoenhancement in the plantar tissues along the
ball of the foot below the second and third MTP joints, correlate
with visual inspection in assessing for tissue necrosis.
8. Low-grade edema signal and enhancement in the head of the second
metatarsal and adjacent base of the proximal phalanx second toe,
possibly degenerative or reactive.

## 2020-03-25 IMAGING — MR MR FOOT*L* WO/W CM
9 series · 40 of 40 positions shown · IV contrast (gadavist)
Comparison: 9 cc Gadavist

CLINICAL DATA: Left foot pain and swelling.  Diabetic foot ulcers.

EXAM:
MRI OF THE LEFT FOREFOOT WITHOUT AND WITH CONTRAST
TECHNIQUE: Multiplanar, multisequence MR imaging of the left foot was performed
both before and after administration of intravenous contrast.
CONTRAST:  9mL GADAVIST GADOBUTROL 1 MMOL/ML IV SOLN

[Series 3: T1 · coronal · 3.0mm · 0.51mm/px · 6 of 49 slices shown (1 of 2)]
[im 1/49]
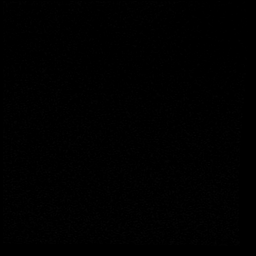
[im 10/49]
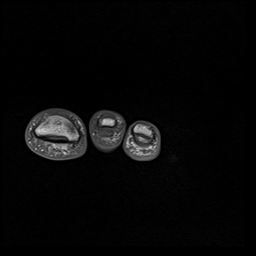
[im 20/49]
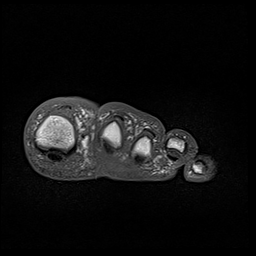
[im 29/49]
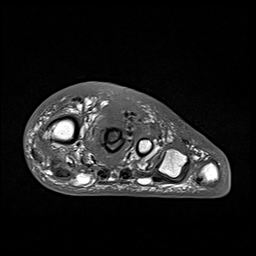
[im 39/49]
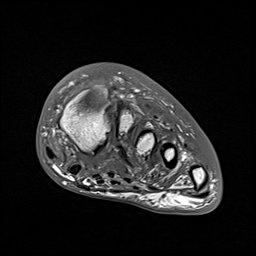
[im 49/49]
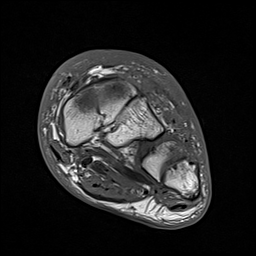

[Series 4: T2 fat-sat · coronal · 3.0mm · 0.41mm/px · 6 of 49 slices shown (1 of 2)]
[im 1/49]
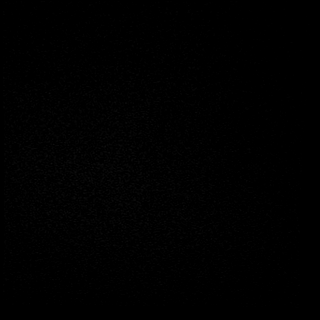
[im 10/49]
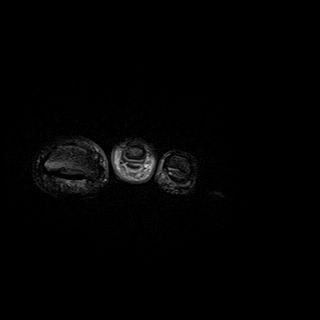
[im 20/49]
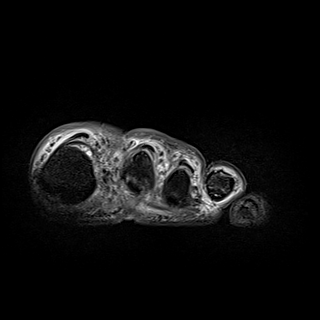
[im 29/49]
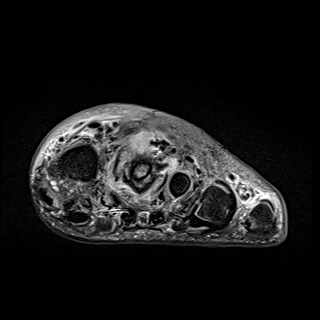
[im 39/49]
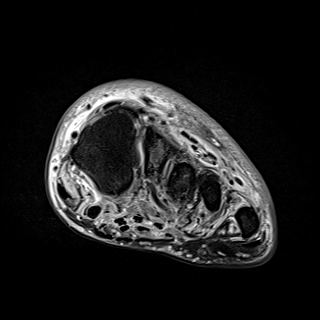
[im 49/49]
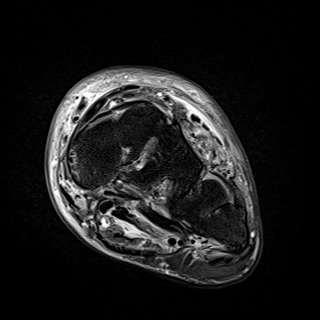

[Series 5: T2 fat-sat · axial · 3.0mm · 0.74mm/px · z∈[-88,+4]mm · 3 of 28 slices shown (2 of 2)]
[im 1/28]
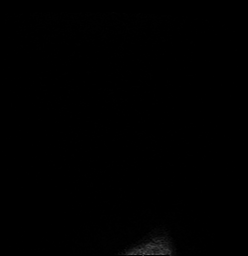
[im 14/28]
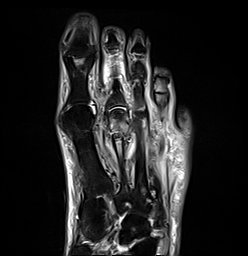
[im 28/28]
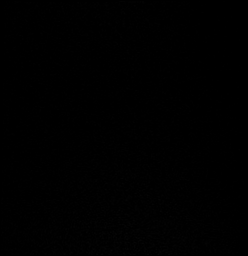

[Series 6: T1 · axial · 3.0mm · 0.74mm/px · z∈[-88,+3]mm · 3 of 28 slices shown (2 of 2)]
[im 1/28]
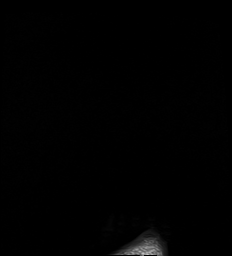
[im 14/28]
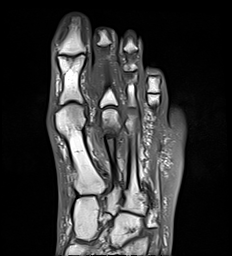
[im 28/28]
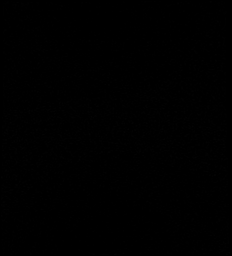

[Series 7: STIR · sagittal · 3.0mm · 0.35mm/px · 4 of 32 slices shown]
[im 1/32]
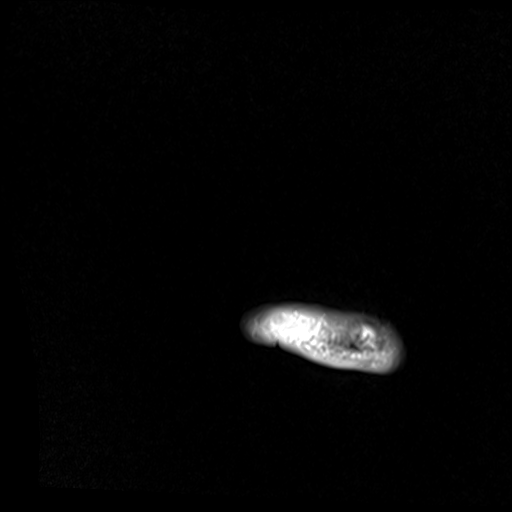
[im 11/32]
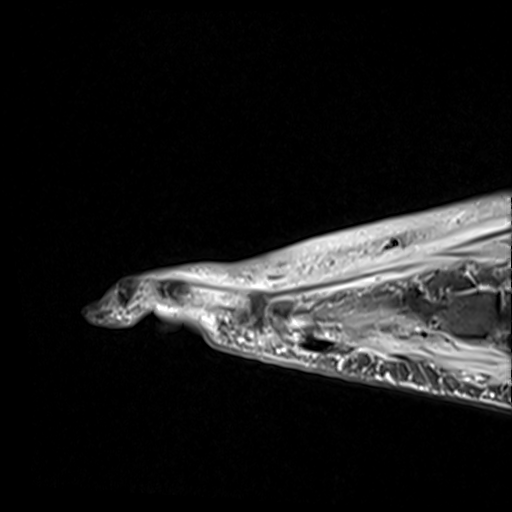
[im 21/32]
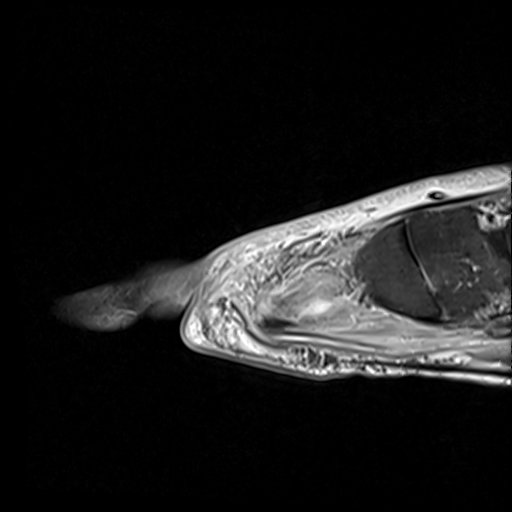
[im 32/32]
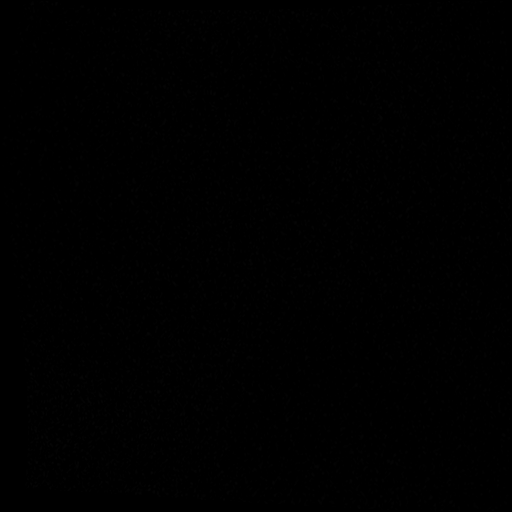

[Series 8: T1 fat-sat · coronal · non-contrast · 3.0mm · 0.47mm/px · 6 of 48 slices shown]
[im 1/48]
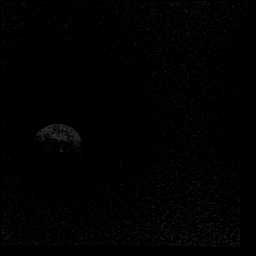
[im 10/48]
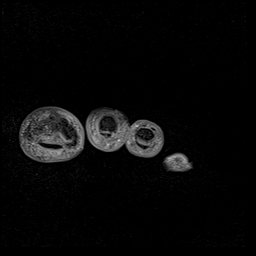
[im 19/48]
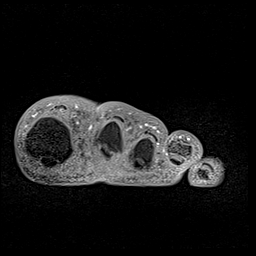
[im 29/48]
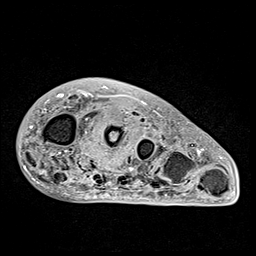
[im 38/48]
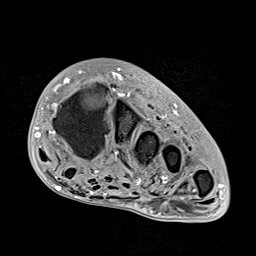
[im 48/48]
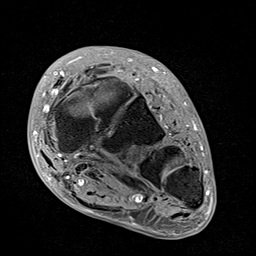

[Series 11: T1 fat-sat post-contrast · coronal · left · 3.0mm · 0.47mm/px · 5 of 43 slices shown (1 of 3)]
[im 1/43]
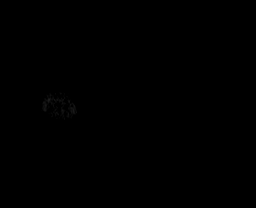
[im 11/43]
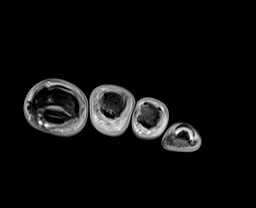
[im 22/43]
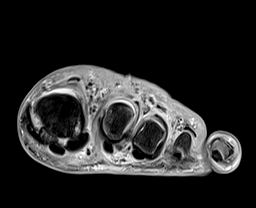
[im 32/43]
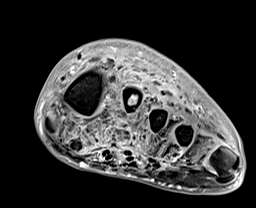
[im 43/43]
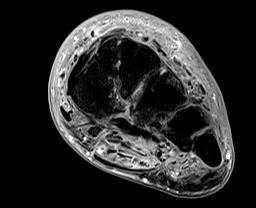

[Series 12: T1 fat-sat post-contrast · axial · left · 3.0mm · 0.56mm/px · z∈[-56,+29]mm · 3 of 26 slices shown (2 of 3)]
[im 1/26]
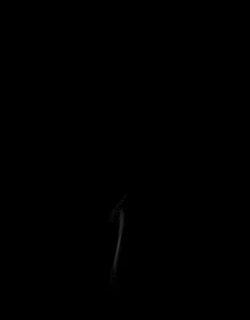
[im 13/26]
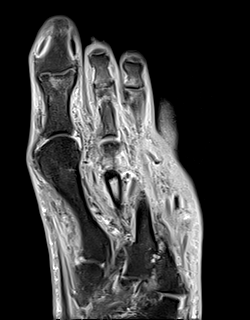
[im 26/26]
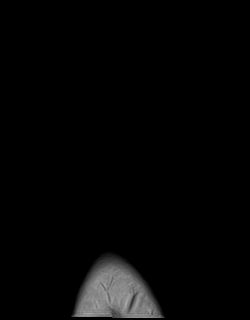

[Series 13: T1 fat-sat post-contrast · sagittal · left · 3.0mm · 0.35mm/px · 4 of 31 slices shown (3 of 3)]
[im 1/31]
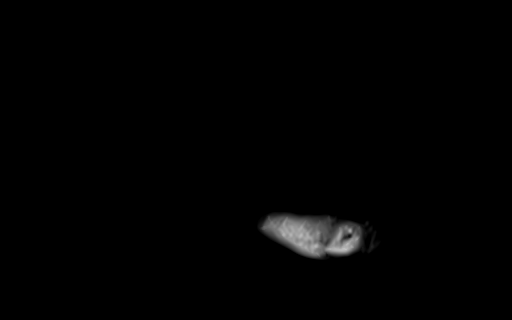
[im 11/31]
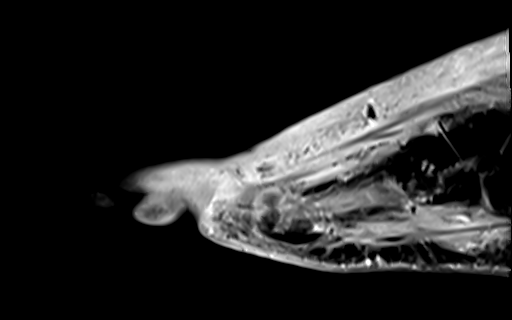
[im 21/31]
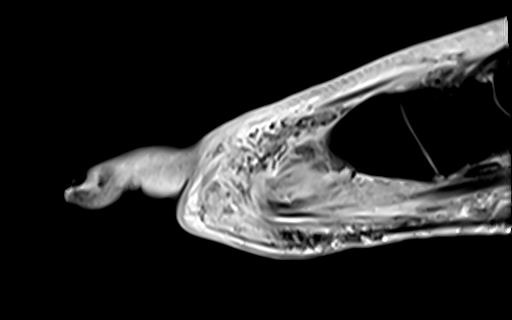
[im 31/31]
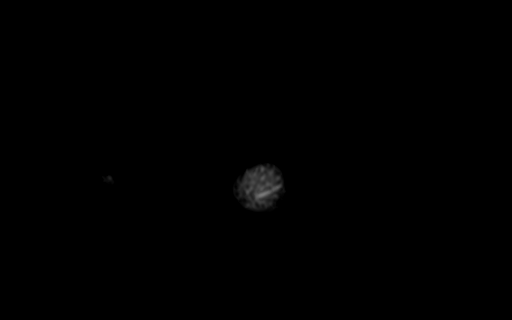

[40 of 40 positions shown; findings below may reference images not displayed]

FINDINGS: Diffuse abnormal T1 and T2 signal intensity in the second metatarsal
shaft and neck highly suspicious for osteomyelitis with an
associated pathologic fracture through the metacarpal neck region.
Marked surrounding soft tissue swelling/edema and rim enhancing
fluid containing gas, consistent with an abscess.

There is also a small open wound along the lateral aspect of the
forefoot overlying the region of the fifth metatarsal head. There is
associated abnormal T1 and T2 signal intensity in the fifth
metacarpal head and subsequent contrast enhancement consistent with
osteomyelitis. I do not see any definite findings for septic
arthritis at the fifth MTP joint.

Diffuse and fairly marked subcutaneous soft tissue
swelling/edema/fluid consistent with cellulitis. No subcutaneous
abscesses are identified. There is also diffuse myofasciitis without
definite findings for pyomyositis.
IMPRESSION: 1. Findings consistent with osteomyelitis involving the second
metatarsal shaft and neck with an associated pathologic fracture
through the metacarpal neck region. There is a surrounding abscess.
2. Small small open wound along the lateral aspect of the forefoot
with underlying osteomyelitis involving the fifth metatarsal head.
3. Diffuse and fairly marked subcutaneous soft tissue
swelling/edema/fluid consistent with cellulitis.

## 2020-03-25 SURGERY — AMPUTATION, TOE
Anesthesia: Monitor Anesthesia Care | Site: Toe | Laterality: Right

## 2020-03-25 MED ORDER — METRONIDAZOLE 500 MG PO TABS
500.0000 mg | ORAL_TABLET | Freq: Three times a day (TID) | ORAL | Status: DC
  Administered 2020-03-25 – 2020-03-29 (×14): 500 mg via ORAL
  Filled 2020-03-25 (×14): qty 1

## 2020-03-25 MED ORDER — ONDANSETRON HCL 4 MG/2ML IJ SOLN
4.0000 mg | Freq: Once | INTRAMUSCULAR | Status: AC
  Administered 2020-03-25: 4 mg via INTRAVENOUS
  Filled 2020-03-25: qty 2

## 2020-03-25 MED ORDER — OXYCODONE HCL 5 MG PO TABS
5.0000 mg | ORAL_TABLET | ORAL | Status: DC | PRN
  Administered 2020-03-25 – 2020-03-26 (×3): 5 mg via ORAL
  Filled 2020-03-25 (×3): qty 1

## 2020-03-25 MED ORDER — AMIODARONE HCL 200 MG PO TABS
200.0000 mg | ORAL_TABLET | Freq: Every day | ORAL | Status: DC
  Administered 2020-03-26 – 2020-03-29 (×4): 200 mg via ORAL
  Filled 2020-03-25 (×4): qty 1

## 2020-03-25 MED ORDER — TORSEMIDE 20 MG PO TABS
20.0000 mg | ORAL_TABLET | Freq: Two times a day (BID) | ORAL | Status: DC
  Administered 2020-03-26 – 2020-03-29 (×6): 20 mg via ORAL
  Filled 2020-03-25 (×9): qty 1

## 2020-03-25 MED ORDER — LORAZEPAM 2 MG/ML IJ SOLN: 0.5000 mg | Freq: Once | INTRAMUSCULAR | Status: DC | PRN

## 2020-03-25 MED ORDER — ATORVASTATIN CALCIUM 40 MG PO TABS
40.0000 mg | ORAL_TABLET | Freq: Every day | ORAL | Status: DC
  Administered 2020-03-26 – 2020-03-29 (×4): 40 mg via ORAL
  Filled 2020-03-25 (×4): qty 1

## 2020-03-25 MED ORDER — ACETAMINOPHEN 325 MG PO TABS: 650.0000 mg | ORAL_TABLET | Freq: Four times a day (QID) | ORAL | Status: DC | PRN

## 2020-03-25 MED ORDER — DULOXETINE HCL 60 MG PO CPEP
60.0000 mg | ORAL_CAPSULE | Freq: Every day | ORAL | Status: DC
  Administered 2020-03-26 – 2020-03-29 (×4): 60 mg via ORAL
  Filled 2020-03-25 (×4): qty 1

## 2020-03-25 MED ORDER — ACETAMINOPHEN 650 MG RE SUPP: 650.0000 mg | Freq: Four times a day (QID) | RECTAL | Status: DC | PRN

## 2020-03-25 MED ORDER — MORPHINE SULFATE (PF) 2 MG/ML IV SOLN
2.0000 mg | INTRAVENOUS | Status: DC | PRN
  Administered 2020-03-25 – 2020-03-29 (×7): 2 mg via INTRAVENOUS
  Filled 2020-03-25 (×7): qty 1

## 2020-03-25 MED ORDER — MORPHINE SULFATE (PF) 4 MG/ML IV SOLN
4.0000 mg | Freq: Once | INTRAVENOUS | Status: AC
  Administered 2020-03-25: 4 mg via INTRAVENOUS
  Filled 2020-03-25: qty 1

## 2020-03-25 MED ORDER — HEPARIN (PORCINE) 25000 UT/250ML-% IV SOLN
1500.0000 [IU]/h | INTRAVENOUS | Status: AC
  Administered 2020-03-25: 1400 [IU]/h via INTRAVENOUS
  Filled 2020-03-25 (×2): qty 250

## 2020-03-25 MED ORDER — SODIUM CHLORIDE 0.9 % IV SOLN
2.0000 g | INTRAVENOUS | Status: DC
  Administered 2020-03-25 – 2020-03-29 (×5): 2 g via INTRAVENOUS
  Filled 2020-03-25 (×3): qty 2
  Filled 2020-03-25: qty 20
  Filled 2020-03-25: qty 2

## 2020-03-25 MED ORDER — GADOBUTROL 1 MMOL/ML IV SOLN
9.0000 mL | Freq: Once | INTRAVENOUS | Status: AC | PRN
  Administered 2020-03-25: 9 mL via INTRAVENOUS

## 2020-03-25 NOTE — H&P (Signed)
History and Physical    Thomas [name] EXH:[id number] DOB: 10/19/1950 DOA: 03/25/2020  PCP: [name], MD  Patient coming from: Home  Chief Complaint: b/l foot pain  HPI: Thomas [name] is a 69 y.o. male with medical history significant of HFrEF, a flutter, HTN. Presenting with b/l foot wounds. Patient had surgery on both feet with podiatry back in October of this year. Was doing well afterwards until about a week ago. He noticed that an ulcer had developed on his 3rd digit of his right foot. He denies any injury to that foot prior to the lesion. He cleaned and bandaged it. The next day he had significant sloughing of the skin of that toe. He became concerned and tried to get in with his podiatrist. He wasn't able to see him until yesterday. When they saw him, they had concern of osteomyelitis and he was told to come to the ED for imaging and abx. Over about the same period, he has also noticed increased swelling and drainage for an ulcer on the dorsal aspect of the left foot. He was tried cleaning and bandaging that area as well, but it has not helped.   ED Course: EDP spoke with podiatry. They recommend abx and MRI. They will follow. Peripheral vascular navigator recommended ABI. They will review and consult vascular surgery if necessary per EDP. TRH was called for admission.   Review of Systems:  Denies CP, dyspnea, fever, N/V/D. Review of systems is otherwise negative for all not mentioned in HPI.   PMHx Past Medical History:  Diagnosis Date  . Atrial flutter ([place]) 01/2020  . Cancer [facility])    prostate  . Claustrophobia    quite severe  . Heart failure with reduced ejection fraction ([place])   . Hypertension   . Hypoglycemia    occ  . Left knee DJD    Xray 12/23/08  . NICM (nonischemic cardiomyopathy) ([place]) 02/12/2020  . Prostate cancer [facility])     PSHx Past Surgical History:  Procedure Laterality Date  . AMPUTATION TOE Right 12/2019  . BUBBLE STUDY  02/11/2020    Procedure: BUBBLE STUDY;  Surgeon: Geralynn Rile, MD;  Location: [place];  Service: Cardiovascular;;  . CARDIOVERSION N/A 02/11/2020   Procedure: CARDIOVERSION;  Surgeon: Geralynn Rile, MD;  Location: [place];  Service: Cardiovascular;  Laterality: N/A;  . CYSTOSCOPY  04/11/2018   Procedure: Erlene Quan;  Surgeon: [name], MD;  Location: [facility];  Service: Urology;;  NO SEEDS FOUND IN BLADDER  . HERNIA REPAIR  2009   inguinal  . JOINT REPLACEMENT Bilateral 2017   knees  . RADIOACTIVE SEED IMPLANT N/A 04/11/2018   Procedure: RADIOACTIVE SEED IMPLANT/BRACHYTHERAPY IMPLANT;  Surgeon: [name], MD;  Location: [facility];  Service: Urology;  Laterality: N/A;   71     SEEDS IMPLANTED  . RIGHT/LEFT HEART CATH AND CORONARY ANGIOGRAPHY N/A 02/09/2020   Procedure: RIGHT/LEFT HEART CATH AND CORONARY ANGIOGRAPHY;  Surgeon: Nelva Bush, MD;  Location: [place] [facility];  Service: Cardiovascular;  Laterality: N/A;  . SPACE OAR INSTILLATION N/A 04/11/2018   Procedure: SPACE OAR INSTILLATION;  Surgeon: [name], MD;  Location: [facility];  Service: Urology;  Laterality: N/A;  . TEE WITHOUT CARDIOVERSION N/A 02/11/2020   Procedure: TRANSESOPHAGEAL ECHOCARDIOGRAM (TEE);  Surgeon: Geralynn Rile, MD;  Location: [place];  Service: Cardiovascular;  Laterality: N/A;  . TOTAL KNEE ARTHROPLASTY Bilateral 09/13/2015   Procedure:  BILATERAL KNEE ARTHROPLASTY ;  Surgeon: Paralee Cancel, MD;  Location: [facility];  Service: Orthopedics;  Laterality: Bilateral;    SocHx  reports that he has never smoked. He has never used smokeless tobacco. He reports previous drug use. He reports that he does not drink alcohol.  Allergies  Allergen Reactions  . Chlorthalidone Other (See Comments)    "Makes me light-headed and I don't like the way it makes me feel"  . Voltaren [Diclofenac] Rash     FamHx Family History  Problem Relation Age of Onset  . Prostate cancer Father   . Prostate cancer Brother   . Colon cancer Neg Hx   . Rectal cancer Neg Hx   . Stomach cancer Neg Hx     Prior to Admission medications   Medication Sig Start Date End Date Taking? Authorizing Provider  amiodarone (PACERONE) 200 MG tablet Take 1 tablet (200 mg total) by mouth 2 (two) times daily. 02/12/20  Yes Isaiah Serge, NP  apixaban (ELIQUIS) 5 MG TABS tablet Take 1 tablet (5 mg total) by mouth 2 (two) times daily. 02/11/20  Yes Sande Rives E, PA-C  atorvastatin (LIPITOR) 40 MG tablet Take 1 tablet (40 mg total) by mouth daily. 02/12/20  Yes Sande Rives E, PA-C  DULoxetine (CYMBALTA) 60 MG capsule Take 1 capsule (60 mg total) by mouth daily. 03/24/20  Yes Chambliss, Jeb Levering, MD  KLOR-CON M20 20 MEQ tablet TAKE 2 TABLETS BY MOUTH 2 TIMES DAILY. 02/19/20  Yes Darreld Mclean, PA-C  Melatonin 10 MG TABS Take 10 mg by mouth at bedtime.   Yes [provider]  oxyCODONE-acetaminophen (PERCOCET) 10-325 MG tablet Take 1 tablet by mouth every 4 (four) hours.  03/10/20  Yes [provider]  predniSONE (DELTASONE) 10 MG tablet Take 10 mg by mouth daily with breakfast.    Yes [provider]  torsemide (DEMADEX) 20 MG tablet Take 1 tablet (20 mg total) by mouth 2 (two) times daily. 02/11/20  Yes Sande Rives E, PA-C  ENBREL SURECLICK 50 MG/ML injection Inject 25 mg into the skin once a week.  Patient not taking: Reported on 03/24/2020 07/07/19   [provider]  methotrexate (RHEUMATREX) 2.5 MG tablet Take 20 mg by mouth once a week.  Patient not taking: Reported on 03/24/2020 08/29/18   [provider]  NARCAN 4 MG/0.1ML LIQD nasal spray kit Place 1 spray into the nose as needed (as directed for emergency). Patient not taking: Reported on 03/24/2020 02/11/20   Darreld Mclean, PA-C  tamsulosin (FLOMAX) 0.4 MG CAPS capsule Take 0.4 mg by mouth  daily. Patient not taking: Reported on 03/24/2020 03/23/20   [provider]    Physical Exam: Vitals:   03/25/20 0830 03/25/20 0900 03/25/20 1000 03/25/20 1115  BP: 131/75 132/77 135/76 120/72  Pulse: 64 (!) 58 (!) 59 (!) 58  Resp: '16 20 16 17  ' Temp:      TempSrc:      SpO2: 99% 98% 98% 99%  Weight:      Height:        General: 68 y.o. male resting in bed in NAD Eyes: PERRL, normal sclera ENMT: Nares patent w/o discharge, orophaynx clear, dentition normal, ears w/o discharge/lesions/ulcers Neck: Supple, trachea midline Cardiovascular: irregular, +S1, S2, no m/g/r, equal pulses throughout Respiratory: CTABL, no w/r/r, normal WOB GI: BS+, NDNT, no masses noted, no organomegaly noted MSK: BLE erythema, amputation of right great toe, sloughing of skin of the 3 toe of  right foot, ulceration of distal 3 digit of right foot, ulceration of dorsal aspect of left foot, ulceration of lateral aspect of left 5th toe at the base Skin: No rashes, bruises, ulcerations noted Neuro: A&O x 3, no focal deficits Psyc: Appropriate interaction and affect, calm/cooperative  Labs on Admission: I have personally reviewed following labs and imaging studies  CBC: Recent Labs  Lab 03/24/20 1025 03/25/20 0954  WBC 12.6* 13.0*  NEUTROABS  --  11.7*  HGB 11.9* 11.5*  HCT 38.3 36.8*  MCV 82 82.7  PLT 323 401   Basic Metabolic Panel: Recent Labs  Lab 03/24/20 1025 03/25/20 0954  NA 139 134*  K 4.5 4.6  CL 94* 94*  CO2 30* 29  GLUCOSE 92 132*  BUN 17 18  CREATININE 0.92 0.79  CALCIUM 9.1 8.8*   GFR: Estimated Creatinine Clearance: 110.9 mL/min (by C-G formula based on SCr of 0.79 mg/dL). Liver Function Tests: Recent Labs  Lab 03/24/20 1025 03/25/20 0954  AST 17 18  ALT 18 17  ALKPHOS 156* 108  BILITOT 0.6 1.0  PROT 6.8 7.4  ALBUMIN 4.1 3.6   No results for input(s): LIPASE, AMYLASE in the last 168 hours. No results for input(s): AMMONIA in the last 168  hours. Coagulation Profile: No results for input(s): INR, PROTIME in the last 168 hours. Cardiac Enzymes: No results for input(s): CKTOTAL, CKMB, CKMBINDEX, TROPONINI in the last 168 hours. BNP (last 3 results) No results for input(s): PROBNP in the last 8760 hours. HbA1C: No results for input(s): HGBA1C in the last 72 hours. CBG: No results for input(s): GLUCAP in the last 168 hours. Lipid Profile: No results for input(s): CHOL, HDL, LDLCALC, TRIG, CHOLHDL, LDLDIRECT in the last 72 hours. Thyroid Function Tests: No results for input(s): TSH, T4TOTAL, FREET4, T3FREE, THYROIDAB in the last 72 hours. Anemia Panel: No results for input(s): VITAMINB12, FOLATE, FERRITIN, TIBC, IRON, RETICCTPCT in the last 72 hours. Urine analysis:    Component Value Date/Time   COLORURINE YELLOW 11/18/2019 1044   APPEARANCEUR CLEAR 11/18/2019 1044   LABSPEC 1.018 11/18/2019 1044   PHURINE 6.0 11/18/2019 1044   GLUCOSEU NEGATIVE 11/18/2019 1044   HGBUR NEGATIVE 11/18/2019 1044   BILIRUBINUR NEGATIVE 11/18/2019 1044   BILIRUBINUR NEG 07/08/2013 1238   KETONESUR 20 (A) 11/18/2019 1044   PROTEINUR 100 (A) 11/18/2019 1044   UROBILINOGEN 0.2 07/08/2013 1238   NITRITE NEGATIVE 11/18/2019 1044   LEUKOCYTESUR NEGATIVE 11/18/2019 1044    Radiological Exams on Admission: VAS [place] ABI WITH/WO TBI  Result Date: 03/25/2020 LOWER EXTREMITY DOPPLER STUDY Indications: Ulceration. High Risk Factors: None.  Limitations: Today's exam was limited due to Right great toe amputation. Comparison Study: No prior studies. Performing Technologist: Carlos Levering RVT  Examination Guidelines: A complete evaluation includes at minimum, Doppler waveform signals and systolic blood pressure reading at the level of bilateral brachial, anterior tibial, and posterior tibial arteries, when vessel segments are accessible. Bilateral testing is considered an integral part of a complete examination. Photoelectric Plethysmograph (PPG) waveforms  and toe systolic pressure readings are included as required and additional duplex testing as needed. Limited examinations for reoccurring indications may be performed as noted.  ABI Findings: +--------+------------------+-----+---------+--------+ Right   Rt Pressure (mmHg)IndexWaveform Comment  +--------+------------------+-----+---------+--------+ [id number]                    triphasic         +--------+------------------+-----+---------+--------+ PTA     233  1.93 triphasic         +--------+------------------+-----+---------+--------+ DP      188               1.55 triphasic         +--------+------------------+-----+---------+--------+ +---------+------------------+-----+---------+-------+ Left     Lt Pressure (mmHg)IndexWaveform Comment +---------+------------------+-----+---------+-------+ Brachial 121                    triphasic        +---------+------------------+-----+---------+-------+ PTA      254               2.10 triphasic        +---------+------------------+-----+---------+-------+ DP       162               1.34 triphasic        +---------+------------------+-----+---------+-------+ Great Toe47                0.39                  +---------+------------------+-----+---------+-------+ +-------+-----------+-----------+------------+------------+ ABI/TBIToday's ABIToday's TBIPrevious ABIPrevious TBI +-------+-----------+-----------+------------+------------+ Right  1.93                                           +-------+-----------+-----------+------------+------------+ Left   1.34       0.39                                +-------+-----------+-----------+------------+------------+  Summary: Right: Resting right ankle-brachial index indicates noncompressible right lower extremity arteries. Unable to obtain TBI due to great toe amputation. Left: Resting left ankle-brachial index indicates noncompressible left lower  extremity arteries. The left toe-brachial index is abnormal.  *See table(s) above for measurements and observations.     Preliminary    Assessment/Plan B/l foot wounds likely osteomyelitis     - admit to inpatient, telemetry     - abx w/ rocephin, flagyl     - podiatry to see     - pain control  A flutter     - will need to hold eliquis in case patient goes to surgery tomorrow     - can start heparin gtt     - continue amiodarone; was supposed to decrease dose to 234m daily on 11/18; will do that now  HFrEF     - last echo showed EF 15%     - on torsemide, but no other meds     - follows with cards     - continue torsemide  HLD     - atrovastatin  Normocytic anemia     - no evidence of bleed; follow  DVT prophylaxis: heparin  Code Status: DNR confirmed with patient.  Family Communication: None at bedside  Consults called: EDP spoke with podiatry, PV navigator  Status is: Inpatient  Remains inpatient appropriate because:Inpatient level of care appropriate due to severity of illness   Dispo: The patient is from: Home              Anticipated d/c is to: Home              Anticipated d/c date is: 3 days              Patient currently is not medically stable to d/c.  [facility]St[facility]  If 7PM-7AM, please contact night-coverage [email or web address]  03/25/2020, 12:11 PM

## 2020-03-25 NOTE — ED Notes (Signed)
Pt back from MRI 

## 2020-03-25 NOTE — Consult Note (Signed)
Reason for Consult: Bilateral foot infections, osteomyelitis Referring Physician: Cherylann Ratel, DO  Thomas [name] is an 69 y.o. male.  HPI: 69 year old male well-known to my partner Dr. Posey Pronto for chronic neuropathic ulcerations of both feet.  He saw him in the office on 03/24/2020 and recommended he present to the ER for admission for IV antibiotics and operative debridement.  He has a history of recent surgery in September of this year with a right hallux amputation and second metatarsal osteotomy left foot.  He states that the left foot began worsening over the last 3 to 4 days.  Past Medical History:  Diagnosis Date  . Atrial flutter ([place]) 01/2020  . Cancer [facility])    prostate  . Claustrophobia    quite severe  . Heart failure with reduced ejection fraction ([place])   . Hypertension   . Hypoglycemia    occ  . Left knee DJD    Xray 12/23/08  . NICM (nonischemic cardiomyopathy) ([place]) 02/12/2020  . Prostate cancer [facility])     Past Surgical History:  Procedure Laterality Date  . AMPUTATION TOE Right 12/2019  . BUBBLE STUDY  02/11/2020   Procedure: BUBBLE STUDY;  Surgeon: Geralynn Rile, MD;  Location: [place];  Service: Cardiovascular;;  . CARDIOVERSION N/A 02/11/2020   Procedure: CARDIOVERSION;  Surgeon: Geralynn Rile, MD;  Location: [place];  Service: Cardiovascular;  Laterality: N/A;  . CYSTOSCOPY  04/11/2018   Procedure: Erlene Quan;  Surgeon: [name], MD;  Location: [facility];  Service: Urology;;  NO SEEDS FOUND IN BLADDER  . HERNIA REPAIR  2009   inguinal  . JOINT REPLACEMENT Bilateral 2017   knees  . RADIOACTIVE SEED IMPLANT N/A 04/11/2018   Procedure: RADIOACTIVE SEED IMPLANT/BRACHYTHERAPY IMPLANT;  Surgeon: [name], MD;  Location: [facility];  Service: Urology;  Laterality: N/A;   71     SEEDS IMPLANTED  . RIGHT/LEFT HEART CATH AND CORONARY ANGIOGRAPHY N/A 02/09/2020   Procedure:  RIGHT/LEFT HEART CATH AND CORONARY ANGIOGRAPHY;  Surgeon: Nelva Bush, MD;  Location: [place] [facility];  Service: Cardiovascular;  Laterality: N/A;  . SPACE OAR INSTILLATION N/A 04/11/2018   Procedure: SPACE OAR INSTILLATION;  Surgeon: [name], MD;  Location: [facility];  Service: Urology;  Laterality: N/A;  . TEE WITHOUT CARDIOVERSION N/A 02/11/2020   Procedure: TRANSESOPHAGEAL ECHOCARDIOGRAM (TEE);  Surgeon: Geralynn Rile, MD;  Location: [place];  Service: Cardiovascular;  Laterality: N/A;  . TOTAL KNEE ARTHROPLASTY Bilateral 09/13/2015   Procedure: BILATERAL KNEE ARTHROPLASTY ;  Surgeon: Paralee Cancel, MD;  Location: [facility];  Service: Orthopedics;  Laterality: Bilateral;    Family History  Problem Relation Age of Onset  . Prostate cancer Father   . Prostate cancer Brother   . Colon cancer Neg Hx   . Rectal cancer Neg Hx   . Stomach cancer Neg Hx     Social History:  reports that he has never smoked. He has never used smokeless tobacco. He reports previous drug use. He reports that he does not drink alcohol.  Allergies:  Allergies  Allergen Reactions  . Chlorthalidone Other (See Comments)    "Makes me light-headed and I don't like the way it makes me feel"  . Voltaren [Diclofenac] Rash    Medications: I have reviewed the patient's current medications.  Results for orders placed or performed during the hospital encounter of 03/25/20 (from the past 48 hour(s))  CBC with Differential  Status: Abnormal   Collection Time: 03/25/20  9:54 AM  Result Value Ref Range   WBC 13.0 (H) 4.0 - 10.5 K/uL   RBC 4.45 4.22 - 5.81 MIL/uL   Hemoglobin 11.5 (L) 13.0 - 17.0 g/dL   HCT 36.8 (L) 39 - 52 %   MCV 82.7 80.0 - 100.0 fL   MCH 25.8 (L) 26.0 - 34.0 pg   MCHC 31.3 30.0 - 36.0 g/dL   RDW 18.8 (H) 11.5 - 15.5 %   Platelets 251 150 - 400 K/uL   nRBC 0.0 0.0 - 0.2 %   Neutrophils Relative % 89 %   Neutro Abs 11.7 (H) 1.7 - 7.7 K/uL    Lymphocytes Relative 4 %   Lymphs Abs 0.5 (L) 0.7 - 4.0 K/uL   Monocytes Relative 6 %   Monocytes Absolute 0.7 0.1 - 1.0 K/uL   Eosinophils Relative 0 %   Eosinophils Absolute 0.0 0.0 - 0.5 K/uL   Basophils Relative 0 %   Basophils Absolute 0.0 0.0 - 0.1 K/uL   Immature Granulocytes 1 %   Abs Immature Granulocytes 0.09 (H) 0.00 - 0.07 K/uL    Comment: Performed at [facility], [place] [address] [place], [place] [address]  Resp Panel by RT-PCR (Flu A&B, Covid) Nasopharyngeal Swab     Status: None   Collection Time: 03/25/20  9:54 AM   Specimen: Nasopharyngeal Swab; Nasopharyngeal(NP) swabs in vial transport medium  Result Value Ref Range   SARS Coronavirus 2 by RT PCR NEGATIVE NEGATIVE    Comment: (NOTE) SARS-CoV-2 target nucleic acids are NOT DETECTED.  The SARS-CoV-2 RNA is generally detectable in upper respiratory specimens during the acute phase of infection. The lowest concentration of SARS-CoV-2 viral copies this assay can detect is 138 copies/mL. A negative result does not preclude SARS-Cov-2 infection and should not be used as the sole basis for treatment or other patient management decisions. A negative result may occur with  improper specimen collection/handling, submission of specimen other than nasopharyngeal swab, presence of viral mutation(s) within the areas targeted by this assay, and inadequate number of viral copies(<138 copies/mL). A negative result must be combined with clinical observations, patient history, and epidemiological information. The expected result is Negative.  Fact Sheet for Patients:  EntrepreneurPulse.com.au  Fact Sheet for Healthcare Providers:  IncredibleEmployment.be  This test is no t yet approved or cleared by the [place] FDA and  has been authorized for detection and/or diagnosis of SARS-CoV-2 by FDA under an Emergency Use Authorization (EUA). This EUA will remain  in effect  (meaning this test can be used) for the duration of the COVID-19 declaration under Section 564(b)(1) of the Act, 21 U.S.C.section 360bbb-3(b)(1), unless the authorization is terminated  or revoked sooner.       Influenza A by PCR NEGATIVE NEGATIVE   Influenza B by PCR NEGATIVE NEGATIVE    Comment: (NOTE) The Xpert Xpress SARS-CoV-2/FLU/RSV plus assay is intended as an aid in the diagnosis of influenza from Nasopharyngeal swab specimens and should not be used as a sole basis for treatment. Nasal washings and aspirates are unacceptable for Xpert Xpress SARS-CoV-2/FLU/RSV testing.  Fact Sheet for Patients: EntrepreneurPulse.com.au  Fact Sheet for Healthcare Providers: IncredibleEmployment.be  This test is not yet approved or cleared by the [place] FDA and has been authorized for detection and/or diagnosis of SARS-CoV-2 by FDA under an Emergency Use Authorization (EUA). This EUA will remain in effect (meaning this test can be used) for the duration of  the COVID-19 declaration under Section 564(b)(1) of the Act, 21 U.S.C. section 360bbb-3(b)(1), unless the authorization is terminated or revoked.  Performed at [facility], [place] [address], [place], [place] [address]   Comprehensive metabolic panel     Status: Abnormal   Collection Time: 03/25/20  9:54 AM  Result Value Ref Range   Sodium 134 (L) 135 - 145 mmol/L   Potassium 4.6 3.5 - 5.1 mmol/L   Chloride 94 (L) 98 - 111 mmol/L   CO2 29 22 - 32 mmol/L   Glucose, Bld 132 (H) 70 - 99 mg/dL    Comment: Glucose reference range applies only to samples taken after fasting for at least 8 hours.   BUN 18 8 - 23 mg/dL   Creatinine, Ser 0.79 0.61 - 1.24 mg/dL   Calcium 8.8 (L) 8.9 - 10.3 mg/dL   Total Protein 7.4 6.5 - 8.1 g/dL   Albumin 3.6 3.5 - 5.0 g/dL   AST 18 15 - 41 U/L   ALT 17 0 - 44 U/L   Alkaline Phosphatase 108 38 - 126 U/L   Total Bilirubin 1.0 0.3 - 1.2 mg/dL     GFR, Estimated >60 >60 mL/min    Comment: (NOTE) Calculated using the CKD-EPI Creatinine Equation (2021)    Anion gap 11 5 - 15    Comment: Performed at [facility], [place] [address] [place], [place] [address]  Lactic acid     Status: None   Collection Time: 03/25/20  9:54 AM  Result Value Ref Range   Lactic Acid, Venous 1.4 0.5 - 1.9 mmol/L    Comment: Performed at [facility], [place] [address] [place], [place] [address]  APTT     Status: Abnormal   Collection Time: 03/25/20  2:50 PM  Result Value Ref Range   aPTT 53 (H) 24 - 36 seconds    Comment:        IF BASELINE aPTT IS ELEVATED, SUGGEST PATIENT RISK ASSESSMENT BE USED TO DETERMINE APPROPRIATE ANTICOAGULANT THERAPY. Performed at [facility], [place] [address], [place], [place] [address]   Protime-INR     Status: Abnormal   Collection Time: 03/25/20  2:50 PM  Result Value Ref Range   Prothrombin Time 15.2 11.4 - 15.2 seconds   INR 1.3 (H) 0.8 - 1.2    Comment: (NOTE) INR goal varies based on device and disease states. Performed at [facility], [place] [address] [place], [place] [address]   Heparin level (unfractionated)     Status: Abnormal   Collection Time: 03/25/20  2:50 PM  Result Value Ref Range   Heparin Unfractionated 1.84 (H) 0.30 - 0.70 IU/mL    Comment: RESULTS CONFIRMED BY MANUAL DILUTION Performed at [place] [address] [place], [place] [address]     MR FOOT RIGHT W WO CONTRAST  Result Date: 03/25/2020 CLINICAL DATA:  Diabetes, foot swelling. Prior amputation of the great toe. Ulcer along the third toe. EXAM: MRI OF THE RIGHT FOREFOOT WITHOUT AND WITH CONTRAST TECHNIQUE: Multiplanar, multisequence MR imaging of the right forefoot was performed before and after the administration of intravenous contrast. CONTRAST:  38mL GADAVIST GADOBUTROL 1 MMOL/ML IV SOLN COMPARISON:  Multiple exams, including  radiographs from 03/03/2020 FINDINGS: Bones/Joint/Cartilage Absent great toe. 1.3 by 1.7 cm erosion along the plantar head of the first metatarsal with a notable surrounding edema in the metatarsal head and distal shaft, suspicious for active osteomyelitis for example  on image 26 of series 7. Nonvisualization of the medial first digit sesamoid, probably eroded. There is edema in the lateral sesamoid of the first digit. Prominent edema and enhancement in the distal phalanx of the third toe. Possible midshaft fracture versus bony destruction. Surrounding subcutaneous edema and enhancement in the third toe. Mild deformity of the distal metaphysis of the proximal phalanx third toe likely from an old fracture. Low-grade marrow edema is present in the head of the second metatarsal. Abnormal marrow edema and enhancement is present in the proximal, middle, and distal phalanges of the small toe with associated subcutaneous edema and enhancement. Possible fracture involving the distal head of the proximal phalanx of the small toe. Appearance concerning for osteomyelitis of the small toe. Ligaments Lisfranc ligament intact. Muscles and Tendons Diffuse low-grade edema and enhancement in the visualized musculature of the forefoot, potentially from myositis or neurogenic edema. Thickening of the detached flexor hallucis longus tendon. Soft tissues Extensive edema and enhancement tracking in the dorsum of the foot and in the remaining toes, favoring cellulitis. I not observe a drainable abscess. There is some hypoenhancement in the plantar tissues along the ball of the foot below the second and third MTP joints, without visualized gas in the soft tissues, correlate with visual inspection in assessing for tissue necrosis. IMPRESSION: 1. Osteomyelitis of the head and distal shaft of the first metatarsal. 2. Prominent edema and enhancement in the distal phalanx of the third toe, favoring osteomyelitis, with possible midshaft fracture  versus bony destruction. 3. Abnormal marrow edema and enhancement in the proximal, middle, and distal phalanges of the small toe, with associated subcutaneous edema and enhancement. Appearance favors osteomyelitis. Possible fracture involving the distal head of the proximal phalanx of the small toe. 4. Extensive edema and enhancement in the dorsum of the foot and in the remaining toes, favoring cellulitis. No drainable abscess. 5. Diffuse low-grade edema and enhancement in the visualized musculature of the forefoot, potentially from myositis or neurogenic edema. 6. Thickening of the detached flexor hallucis longus tendon, compatible with prior amputation. 7. There is some hypoenhancement in the plantar tissues along the ball of the foot below the second and third MTP joints, correlate with visual inspection in assessing for tissue necrosis. 8. Low-grade edema signal and enhancement in the head of the second metatarsal and adjacent base of the proximal phalanx second toe, possibly degenerative or reactive. Electronically Signed   By: Van Clines M.D.   On: 03/25/2020 14:36   MR FOOT LEFT W WO CONTRAST  Result Date: 03/25/2020 CLINICAL DATA:  Left foot pain and swelling.  Diabetic foot ulcers. EXAM: MRI OF THE LEFT FOREFOOT WITHOUT AND WITH CONTRAST TECHNIQUE: Multiplanar, multisequence MR imaging of the left foot was performed both before and after administration of intravenous contrast. CONTRAST:  4mL GADAVIST GADOBUTROL 1 MMOL/ML IV SOLN COMPARISON:  9 cc Gadavist FINDINGS: Diffuse abnormal T1 and T2 signal intensity in the second metatarsal shaft and neck highly suspicious for osteomyelitis with an associated pathologic fracture through the metacarpal neck region. Marked surrounding soft tissue swelling/edema and rim enhancing fluid containing gas, consistent with an abscess. There is also a small open wound along the lateral aspect of the forefoot overlying the region of the fifth metatarsal head.  There is associated abnormal T1 and T2 signal intensity in the fifth metacarpal head and subsequent contrast enhancement consistent with osteomyelitis. I do not see any definite findings for septic arthritis at the fifth MTP joint. Diffuse and fairly marked subcutaneous soft  tissue swelling/edema/fluid consistent with cellulitis. No subcutaneous abscesses are identified. There is also diffuse myofasciitis without definite findings for pyomyositis. IMPRESSION: 1. Findings consistent with osteomyelitis involving the second metatarsal shaft and neck with an associated pathologic fracture through the metacarpal neck region. There is a surrounding abscess. 2. Small small open wound along the lateral aspect of the forefoot with underlying osteomyelitis involving the fifth metatarsal head. 3. Diffuse and fairly marked subcutaneous soft tissue swelling/edema/fluid consistent with cellulitis. Electronically Signed   By: Marijo Sanes M.D.   On: 03/25/2020 14:40   VAS [place] ABI WITH/WO TBI  Result Date: 03/25/2020 LOWER EXTREMITY DOPPLER STUDY Indications: Ulceration. High Risk Factors: None.  Limitations: Today's exam was limited due to Right great toe amputation. Comparison Study: No prior studies. Performing Technologist: Carlos Levering RVT  Examination Guidelines: A complete evaluation includes at minimum, Doppler waveform signals and systolic blood pressure reading at the level of bilateral brachial, anterior tibial, and posterior tibial arteries, when vessel segments are accessible. Bilateral testing is considered an integral part of a complete examination. Photoelectric Plethysmograph (PPG) waveforms and toe systolic pressure readings are included as required and additional duplex testing as needed. Limited examinations for reoccurring indications may be performed as noted.  ABI Findings: +--------+------------------+-----+---------+--------+ Right   Rt Pressure (mmHg)IndexWaveform Comment   +--------+------------------+-----+---------+--------+ [id number]                    triphasic         +--------+------------------+-----+---------+--------+ PTA     233               1.93 triphasic         +--------+------------------+-----+---------+--------+ DP      188               1.55 triphasic         +--------+------------------+-----+---------+--------+ +---------+------------------+-----+---------+-------+ Left     Lt Pressure (mmHg)IndexWaveform Comment +---------+------------------+-----+---------+-------+ Brachial 121                    triphasic        +---------+------------------+-----+---------+-------+ PTA      254               2.10 triphasic        +---------+------------------+-----+---------+-------+ DP       162               1.34 triphasic        +---------+------------------+-----+---------+-------+ Great Toe47                0.39                  +---------+------------------+-----+---------+-------+ +-------+-----------+-----------+------------+------------+ ABI/TBIToday's ABIToday's TBIPrevious ABIPrevious TBI +-------+-----------+-----------+------------+------------+ Right  1.93                                           +-------+-----------+-----------+------------+------------+ Left   1.34       0.39                                +-------+-----------+-----------+------------+------------+  Summary: Right: Resting right ankle-brachial index indicates noncompressible right lower extremity arteries. Unable to obtain TBI due to great toe amputation. Left: Resting left ankle-brachial index indicates noncompressible left lower extremity arteries. The left toe-brachial index is abnormal.  *See table(s) above for measurements and  observations.     Preliminary     Review of Systems  Constitutional: Negative for chills and fever.  HENT: Negative.   Eyes: Negative.   Respiratory: Negative.   Cardiovascular: Negative for  chest pain, orthopnea and leg swelling.  Gastrointestinal: Negative.   Genitourinary: Negative.   Musculoskeletal: Negative.   Skin:       Multiple foot ulcers, redness and swelling of the left foot  Neurological: Negative.   Endo/Heme/Allergies: Negative.   Psychiatric/Behavioral: Negative.   All other systems reviewed and are negative.  Blood pressure 121/69, pulse (!) 59, temperature (!) 97.4 F (36.3 C), temperature source Oral, resp. rate 16, height 6\' 7"  (2.007 m), weight 87.5 kg, SpO2 98 %.  Vitals:   03/25/20 1630 03/25/20 1730  BP: 115/62 121/69  Pulse: 62 (!) 59  Resp: 19 16  Temp:    SpO2: 100% 98%    General AA&O x3. Normal mood and affect.  Vascular Dorsalis pedis and posterior tibial pulses  present 1+ bilaterally  Capillary refill normal to all digits. Pedal hair growth diminished.  Neurologic Epicritic sensation grossly absent.  Dermatologic (Wound)  left foot has cellulitis extending from a dorsal abscess over the second metatarsal, there is a partial-thickness ulceration over the left fifth metatarsal with hyperkeratosis, right foot partial hallux amputation site is well-healed, no ulceration over the fifth toe, distal tip ulceration of the third toe which probes directly to bone.  Orthopedic: Motor intact BLE.    Assessment/Plan:  Abscess and cellulitis of left foot, osteomyelitis right third toe, questionable osteomyelitis of right fifth toe, right first metatarsal, second metatarsal left foot -Imaging: Studies independently reviewed.  I certainly think he has an abscess of over the second metatarsal left foot as well as osteomyelitis of the third toe.  The osteomyelitis of the right fifth toe is more questionable to me does not have a history of ulceration in this area or previous surgery. -Antibiotics: Continue broad-spectrum antibiotics for now -WB Status: WBAT BLE -Surgical Plan: Plan for Incision and drainage and bone biopsy of the left foot second  metatarsal, right third toe amputation and bone biopsy of the first metatarsal, fifth toe; however, he ate a full sandwich about an hour prior to the planned OR time tonight.  Canceled for tonight and will operate tomorrow with myself or my partner Dr. March Rummage will be on-call tomorrow night. -I do long discussion with him regarding the prognosis for both his feet.  He states that he has had significant financial distress from his chronic foot ulcerations.  He is eager to get out of the hospital.  Advised him that letting this infection spread further continue to persist could result in further limb amputation.  Criselda Peaches 03/25/2020, 6:53 PM   Best available via secure chat for questions or concerns.

## 2020-03-25 NOTE — ED Notes (Signed)
Pt to MRI via w/c. Refused Ativan at this time.

## 2020-03-25 NOTE — ED Notes (Signed)
Dr Sherryle Lis at bedside to see pt.

## 2020-03-25 NOTE — ED Provider Notes (Signed)
[place] DEPT Provider Note   CSN: [id number] Arrival date & time: 03/25/20  3267     History Chief Complaint  Patient presents with  . Foot Pain    Thomas [name] is a 69 y.o. male.  HPI      Thomas [name] is a 69 y.o. male, with a history of a flutter anticoagulated on Eliquis, HTN, presenting to the ED with pain in bilateral feet with concern for infection. Patient noted swelling, pain, and ulceration a few days ago primarily in the right third toe. He has also noted new ulcerated wounds to the left dorsal and lateral foot. Patient was seen by his podiatrist, Dr. Posey Pronto, yesterday for worsening pain, swelling, and ulceration to the right third toe. X-ray performed at that time with concern for osteomyelitis. Patient was asked to come to the emergency department for admission to facilitate MRI and IV antibiotics. Pain is throbbing, moderate to severe, radiating from the toes into the feet. Patient notes he had amputation to the right first toe in the middle of October. He has had issues with dehiscence. He has had swelling, erythema, and pain to the bilateral feet and toes. No recent antibiotic use.  Denies fever/chills, other extremity swelling, chest pain, shortness of breath, numbness, weakness, or any other complaints.      Past Medical History:  Diagnosis Date  . Atrial flutter ([place]) 01/2020  . Cancer [facility])    prostate  . Claustrophobia    quite severe  . Heart failure with reduced ejection fraction ([place])   . Hypertension   . Hypoglycemia    occ  . Left knee DJD    Xray 12/23/08  . NICM (nonischemic cardiomyopathy) ([place]) 02/12/2020  . Prostate cancer [facility])     Patient Active Problem List   Diagnosis Date Noted  . Osteomyelitis of ankle or foot, acute, unspecified laterality ([place]) 03/25/2020  . NICM (nonischemic cardiomyopathy) ([place]), no significant CAD on cardiac cath   02/12/2020  . Right foot pain   . Acute systolic heart  failure ([place])   . Rheumatoid arthritis ([place])   . Atrial flutter ([place]) 02/02/2020  . Lower extremity edema 12/30/2019  . Weight loss, non-intentional 11/24/2019  . Chronic right hip pain 06/19/2019  . Degenerative tear of acetabular labrum of right hip 05/02/2019  . Primary osteoarthritis of right hip 05/02/2019  . Skin ulcer ([place]) 01/29/2019  . Malignant neoplasm of prostate ([place]) 01/16/2018  . Insomnia 10/02/2017  . Cyclic citrullinated peptide (CCP) antibody positive 05/31/2016  . S/P bilateral TKA 09/13/2015  . Other bilateral secondary osteoarthritis of knee 07/14/2014  . Heme positive stool 05/21/2013  . Osteoarthritis, multiple sites 01/13/2011  . Hypertension 12/06/2010  . ED (erectile dysfunction) 08/25/2010  . CLAUSTROPHOBIA 03/01/2010    Past Surgical History:  Procedure Laterality Date  . AMPUTATION TOE Right 12/2019  . BUBBLE STUDY  02/11/2020   Procedure: BUBBLE STUDY;  Surgeon: Geralynn Rile, MD;  Location: [place];  Service: Cardiovascular;;  . CARDIOVERSION N/A 02/11/2020   Procedure: CARDIOVERSION;  Surgeon: Geralynn Rile, MD;  Location: [place];  Service: Cardiovascular;  Laterality: N/A;  . CYSTOSCOPY  04/11/2018   Procedure: Erlene Quan;  Surgeon: [name], MD;  Location: [facility];  Service: Urology;;  NO SEEDS FOUND IN BLADDER  . HERNIA REPAIR  2009   inguinal  . JOINT REPLACEMENT Bilateral 2017   knees  . RADIOACTIVE SEED IMPLANT N/A 04/11/2018   Procedure: RADIOACTIVE  SEED IMPLANT/BRACHYTHERAPY IMPLANT;  Surgeon: [name], MD;  Location: [facility];  Service: Urology;  Laterality: N/A;   71     SEEDS IMPLANTED  . RIGHT/LEFT HEART CATH AND CORONARY ANGIOGRAPHY N/A 02/09/2020   Procedure: RIGHT/LEFT HEART CATH AND CORONARY ANGIOGRAPHY;  Surgeon: Nelva Bush, MD;  Location: [place] [facility];  Service: Cardiovascular;  Laterality: N/A;  . SPACE OAR INSTILLATION N/A  04/11/2018   Procedure: SPACE OAR INSTILLATION;  Surgeon: [name], MD;  Location: [facility];  Service: Urology;  Laterality: N/A;  . TEE WITHOUT CARDIOVERSION N/A 02/11/2020   Procedure: TRANSESOPHAGEAL ECHOCARDIOGRAM (TEE);  Surgeon: Geralynn Rile, MD;  Location: [place];  Service: Cardiovascular;  Laterality: N/A;  . TOTAL KNEE ARTHROPLASTY Bilateral 09/13/2015   Procedure: BILATERAL KNEE ARTHROPLASTY ;  Surgeon: Paralee Cancel, MD;  Location: [facility];  Service: Orthopedics;  Laterality: Bilateral;       Family History  Problem Relation Age of Onset  . Prostate cancer Father   . Prostate cancer Brother   . Colon cancer Neg Hx   . Rectal cancer Neg Hx   . Stomach cancer Neg Hx     Social History   Tobacco Use  . Smoking status: Never Smoker  . Smokeless tobacco: Never Used  Vaping Use  . Vaping Use: Never used  Substance Use Topics  . Alcohol use: No    Alcohol/week: 0.0 standard drinks  . Drug use: Not Currently    Home Medications Prior to Admission medications   Medication Sig Start Date End Date Taking? Authorizing Provider  amiodarone (PACERONE) 200 MG tablet Take 1 tablet (200 mg total) by mouth 2 (two) times daily. 02/12/20  Yes Isaiah Serge, NP  apixaban (ELIQUIS) 5 MG TABS tablet Take 1 tablet (5 mg total) by mouth 2 (two) times daily. 02/11/20  Yes Sande Rives E, PA-C  atorvastatin (LIPITOR) 40 MG tablet Take 1 tablet (40 mg total) by mouth daily. 02/12/20  Yes Sande Rives E, PA-C  DULoxetine (CYMBALTA) 60 MG capsule Take 1 capsule (60 mg total) by mouth daily. 03/24/20  Yes Chambliss, Jeb Levering, MD  KLOR-CON M20 20 MEQ tablet TAKE 2 TABLETS BY MOUTH 2 TIMES DAILY. Patient taking differently: Take 40 mEq by mouth 2 (two) times daily.  02/19/20  Yes Darreld Mclean, PA-C  Melatonin 10 MG TABS Take 10 mg by mouth at bedtime.   Yes [provider]  NARCAN 4 MG/0.1ML LIQD nasal spray kit Place 1 spray into  the nose as needed (as directed for emergency). Patient taking differently: Place 1 spray into the nose daily as needed (as directed for emergency).  02/11/20  Yes Sande Rives E, PA-C  oxyCODONE-acetaminophen (PERCOCET) 10-325 MG tablet Take 1 tablet by mouth every 4 (four) hours.  03/10/20  Yes [provider]  predniSONE (DELTASONE) 10 MG tablet Take 10 mg by mouth daily with breakfast.    Yes [provider]  torsemide (DEMADEX) 20 MG tablet Take 1 tablet (20 mg total) by mouth 2 (two) times daily. 02/11/20  Yes Sande Rives E, PA-C  ENBREL SURECLICK 50 MG/ML injection Inject 25 mg into the skin once a week.  Patient not taking: Reported on 03/25/2020 07/07/19   [provider]  methotrexate (RHEUMATREX) 2.5 MG tablet Take 20 mg by mouth once a week.  Patient not taking: Reported on 03/24/2020 08/29/18   [provider]  tamsulosin (FLOMAX) 0.4 MG CAPS capsule Take 0.4 mg  by mouth daily. Patient not taking: Reported on 03/24/2020 03/23/20   [provider]    Allergies    Chlorthalidone and Voltaren [diclofenac]  Review of Systems   Review of Systems  Constitutional: Negative for chills and fever.  Respiratory: Negative for shortness of breath.   Cardiovascular: Negative for chest pain.  Gastrointestinal: Negative for abdominal pain, nausea and vomiting.  Musculoskeletal: Positive for arthralgias and joint swelling.  Skin: Positive for color change and wound.  Neurological: Negative for weakness and numbness.  All other systems reviewed and are negative.   Physical Exam Updated Vital Signs BP 131/75 (BP Location: Left Arm)   Pulse 64   Temp (!) 97.4 F (36.3 C) (Oral)   Resp 16   Ht _0  (2.007 m)   Wt 87.5 kg   SpO2 99%   BMI 21.74 kg/m   Physical Exam Vitals and nursing note reviewed.  Constitutional:      General: He is not in acute distress.    Appearance: He is well-developed. He is not diaphoretic.  HENT:      Head: Normocephalic and atraumatic.     Mouth/Throat:     Mouth: Mucous membranes are moist.     Pharynx: Oropharynx is clear.  Eyes:     Conjunctiva/sclera: Conjunctivae normal.  Cardiovascular:     Rate and Rhythm: Normal rate and regular rhythm.     Pulses:          Radial pulses are 2+ on the right side and 2+ on the left side.       Dorsalis pedis pulses are 1+ on the right side and 1+ on the left side.  Pulmonary:     Effort: Pulmonary effort is normal. No respiratory distress.  Abdominal:     Tenderness: There is no guarding.  Musculoskeletal:     Cervical back: Neck supple.     Right lower leg: Edema present.     Left lower leg: Edema present.     Comments: Tenderness, swelling, and erythema to the right third toe.  Ulceration to the tip of the toe without noted purulence. Erythema extending to about the ankle on the right foot.  Left foot with dorsal and lateral ulcerations.  Erythema, tenderness, and increased warmth throughout the left foot and into the left lower leg. Lower extremities with pretibial tenderness and edema bilaterally.  Skin:    General: Skin is warm and dry.     Capillary Refill: Capillary refill takes less than 2 seconds.  Neurological:     Mental Status: He is alert.     Comments: Sensation to light touch grossly intact in the bilateral lower extremities. Motor function with flexion and extension intact in the bilateral lower extremities.  Psychiatric:        Mood and Affect: Mood and affect normal.        Speech: Speech normal.        Behavior: Behavior normal.              Pictures from yesterday (03/24/20):        ED Results / Procedures / Treatments   Labs (all labs ordered are listed, but only abnormal results are displayed) Labs Reviewed  CBC WITH DIFFERENTIAL/PLATELET - Abnormal; Notable for the following components:      Result Value   WBC 13.0 (*)    Hemoglobin 11.5 (*)    HCT 36.8 (*)    MCH 25.8 (*)    RDW 18.8  (*)  Neutro Abs 11.7 (*)    Lymphs Abs 0.5 (*)    Abs Immature Granulocytes 0.09 (*)    All other components within normal limits  COMPREHENSIVE METABOLIC PANEL - Abnormal; Notable for the following components:   Sodium 134 (*)    Chloride 94 (*)    Glucose, Bld 132 (*)    Calcium 8.8 (*)    All other components within normal limits  RESP PANEL BY RT-PCR (FLU A&B, COVID) ARPGX2  CULTURE, BLOOD (ROUTINE X 2)  CULTURE, BLOOD (ROUTINE X 2)  LACTIC ACID, PLASMA   Hemoglobin  Date Value Ref Range Status  03/25/2020 11.5 (L) 13.0 - 17.0 g/dL Final  03/24/2020 11.9 (L) 13.0 - 17.7 g/dL Final  02/11/2020 14.3 13.0 - 17.0 g/dL Final  02/11/2020 12.8 (L) 13.0 - 17.0 g/dL Final  02/10/2020 12.6 (L) 13.0 - 17.0 g/dL Final   WBC  Date Value Ref Range Status  03/25/2020 13.0 (H) 4.0 - 10.5 K/uL Final  03/24/2020 12.6 (H) 3.4 - 10.8 x10E3/uL Final  02/11/2020 6.4 4.0 - 10.5 K/uL Final  02/10/2020 5.8 4.0 - 10.5 K/uL Final  02/09/2020 6.5 4.0 - 10.5 K/uL Final    EKG None  Radiology   Procedures Procedures (including critical care time)  Medications Ordered in ED Medications  cefTRIAXone (ROCEPHIN) 2 g in sodium chloride 0.9 % 100 mL IVPB (0 g Intravenous Stopped 03/25/20 1100)    And  metroNIDAZOLE (FLAGYL) tablet 500 mg (500 mg Oral Given 03/25/20 1028)  LORazepam (ATIVAN) injection 0.5 mg (has no administration in time range)  morphine 4 MG/ML injection 4 mg (4 mg Intravenous Given 03/25/20 1019)  ondansetron (ZOFRAN) injection 4 mg (4 mg Intravenous Given 03/25/20 1019)    ED Course  I have reviewed the triage vital signs and the nursing notes.  Pertinent labs & imaging results that were available during my care of the patient were reviewed by me and considered in my medical decision making (see chart for details).  Clinical Course as of Mar 25 1234  Thu Mar 25, 2020  1035 Received a message from Cletis Media, Therapist, sports, Peripheral Vascular Coordinator.  She states they will be  following the patient.  She requests vascular ultrasound ABI to be performed since he has had an amputation since his last one was performed.  They will follow this imaging and the results.   [SJ]  1610 Spoke with Sindy Messing, RN, Nursing Coordinator with [facility]. States they will have one of their providers round on the patient today.   [SJ]  [place] with Dr. Marylyn Ishihara, hospitalist.  Agrees to admit the patient.   [SJ]    Clinical Course User Index [SJ] Nakul Avino, Helane Gunther, PA-C   MDM Rules/Calculators/A&P                           Patient presents with worsening pain, swelling, erythema, and wounds to the bilateral feet.  Concern for osteomyelitis on x-ray reported by podiatry.  Requesting admission for IV antibiotics as well as MRI. Patient is nontoxic appearing, afebrile, not tachycardic, not tachypneic, not hypotensive, maintains excellent SPO2 on room air, and is in no apparent distress.   I have reviewed the patient's chart to obtain more information.   Due to critical shortage of IV Flagyl, oral was ordered in this instance. Patient had a negative MRSA PCR screening October 2021. I personally reviewed and interpreted the patient's labs and available imaging studies.  Mild leukocytosis.  No lactic acidosis.  Low suspicion for sepsis at this time.  Patient admitted via the hospitalist service.  Vascular ultrasound ABI and MRIs pending at time of admission  Findings and plan of care discussed with Antony Blackbird, MD. Dr. Sherry Ruffing personally evaluated and examined this patient.   Vitals:   03/25/20 0900 03/25/20 1000 03/25/20 1115 03/25/20 1130  BP: 132/77 135/76 120/72 128/81  Pulse: (!) 58 (!) 59 (!) 58 60  Resp: _0 Temp:      TempSrc:      SpO2: 98% 98% 99% 100%  Weight:      Height:         Final Clinical Impression(s) / ED Diagnoses Final diagnoses:  Foot infection    Rx / DC Orders ED Discharge Orders    None       Layla Maw 03/25/20  1235    Tegeler, Gwenyth Allegra, MD 03/26/20 1850

## 2020-03-25 NOTE — ED Notes (Signed)
Pt still in MRI. Will obtain vitals upon return.

## 2020-03-25 NOTE — Telephone Encounter (Signed)
Arlean Hopping PA, has requested hospital consult for the patient for suspected osteomyelitis.   Physician contact [phone]  Patient location: Thomas [name] ED Rm#[id number]

## 2020-03-25 NOTE — Plan of Care (Signed)

## 2020-03-25 NOTE — Progress Notes (Signed)
ANTICOAGULATION CONSULT NOTE - Initial Consult  Pharmacy Consult for Heparin while apixaban on hold Indication: atrial fibrillation  Allergies  Allergen Reactions  . Chlorthalidone Other (See Comments)    "Makes me light-headed and I don't like the way it makes me feel"  . Voltaren [Diclofenac] Rash    Patient Measurements: Height: 6\' 7"  (200.7 cm) Weight: 87.5 kg (193 lb) IBW/kg (Calculated) : 93.7 Heparin Dosing Weight: actual weight  Vital Signs: Temp: 97.4 F (36.3 C) (12/02 0717) Temp Source: Oral (12/02 0717) BP: 122/76 (12/02 1300) Pulse Rate: 56 (12/02 1300)  Labs: Recent Labs    03/24/20 1025 03/25/20 0954  HGB 11.9* 11.5*  HCT 38.3 36.8*  PLT 323 251  CREATININE 0.92 0.79    Estimated Creatinine Clearance: 110.9 mL/min (by C-G formula based on SCr of 0.79 mg/dL).   Medical History: Past Medical History:  Diagnosis Date  . Atrial flutter ([place]) 01/2020  . Cancer [facility])    prostate  . Claustrophobia    quite severe  . Heart failure with reduced ejection fraction ([place])   . Hypertension   . Hypoglycemia    occ  . Left knee DJD    Xray 12/23/08  . NICM (nonischemic cardiomyopathy) ([place]) 02/12/2020  . Prostate cancer ([place])     Medications:  Scheduled:  . metroNIDAZOLE  500 mg Oral Q8H   Infusions:  . cefTRIAXone (ROCEPHIN)  IV Stopped (03/25/20 1100)   PRN: LORazepam  Assessment: 69 yo male with history of afib on eliquis presents with bilateral foot wounds. Due to concern for osteomyelitis and possible need for surgery, pharmacy is consulted to transition to IV heparin.  Last dose of apixaban was 12/2 at 05:30  Goal of Therapy:  Heparin level 0.3-0.7 units/ml  PTT 66-102 seconds Monitor platelets by anticoagulation protocol: Yes   Plan:  At 16:00, begin IV heparin infusion (no bolus) 1400 units/hr Check PTT in 6hrs, monitor with PTTs since heparin levels will be falsely elevated due to recent apixaban use Daily heparin level and PTT and  correlate, then monitor using heparin levels only  Peggyann Juba, PharmD, BCPS Pharmacy: [phone] 03/25/2020,2:13 PM

## 2020-03-25 NOTE — Progress Notes (Signed)
ANTICOAGULATION CONSULT NOTE -   Pharmacy Consult for Heparin while apixaban on hold Indication: atrial fibrillation  Allergies  Allergen Reactions  . Chlorthalidone Other (See Comments)    "Makes me light-headed and I don't like the way it makes me feel"  . Voltaren [Diclofenac] Rash    Patient Measurements: Height: 6\' 7"  (200.7 cm) Weight: 87.5 kg (193 lb) IBW/kg (Calculated) : 93.7 Heparin Dosing Weight: actual weight  Vital Signs: Temp: 98.4 F (36.9 C) (12/02 2125) Temp Source: Oral (12/02 2125) BP: 125/80 (12/02 2125) Pulse Rate: 58 (12/02 2125)  Labs: Recent Labs    03/24/20 1025 03/25/20 0954 03/25/20 1450 03/25/20 2246  HGB 11.9* 11.5*  --   --   HCT 38.3 36.8*  --   --   PLT 323 251  --   --   APTT  --   --  53* 68*  LABPROT  --   --  15.2  --   INR  --   --  1.3*  --   HEPARINUNFRC  --   --  1.84*  --   CREATININE 0.92 0.79  --   --     Estimated Creatinine Clearance: 110.9 mL/min (by C-G formula based on SCr of 0.79 mg/dL).   Medical History: Past Medical History:  Diagnosis Date  . Atrial flutter ([place]) 01/2020  . Cancer [facility])    prostate  . Claustrophobia    quite severe  . Heart failure with reduced ejection fraction ([place])   . Hypertension   . Hypoglycemia    occ  . Left knee DJD    Xray 12/23/08  . NICM (nonischemic cardiomyopathy) ([place]) 02/12/2020  . Prostate cancer ([place])     Medications:  Scheduled:  . [START ON 03/26/2020] amiodarone  200 mg Oral Daily  . atorvastatin  40 mg Oral Daily  . [START ON 03/26/2020] DULoxetine  60 mg Oral Daily  . metroNIDAZOLE  500 mg Oral Q8H  . [START ON 03/26/2020] torsemide  20 mg Oral BID   Infusions:  . cefTRIAXone (ROCEPHIN)  IV Stopped (03/25/20 1100)  . heparin 1,400 Units/hr (03/25/20 1630)   PRN: acetaminophen **OR** acetaminophen, morphine injection, oxyCODONE  Assessment: 69 yo male with history of afib on eliquis presents with bilateral foot wounds. Due to concern for osteomyelitis  and possible need for surgery, pharmacy is consulted to transition to IV heparin.  Last dose of apixaban was 12/2 at 05:30  03/25/2020 APTT 68, therapeutic No bleeding noted  Goal of Therapy:  Heparin level 0.3-0.7 units/ml  PTT 66-102 seconds Monitor platelets by anticoagulation protocol: Yes   Plan:  Continue heparin drip at 1400 units/hr Check PTT in 6hrs, monitor with PTTs since heparin levels will be falsely elevated due to recent apixaban use Daily heparin level and PTT and correlate, then monitor using heparin levels only  Dolly Rias RPh 03/25/2020, 11:49 PM

## 2020-03-25 NOTE — ED Triage Notes (Signed)
Pt reports sx 3 wks ago to remove infected toe(s); post op visit shows more toes on BLE that may have infection to bone per MD

## 2020-03-25 NOTE — Consult Note (Signed)
PV Navigator Consult acknowledged and chart and media photos reviewed. Working from remote location.   Reason for consult: Foot pain, wounds bilateral feet.  Admitted with worsening R foot pain with wound on right 3rd toe and new wounds to L dorsal and lateral foot with redness/erythema. Patient is followed by podiatry on a regular basis for wound care of these areas.  Patient is s/p R great toe amputation April 2021. Last VAS [place] ABI performed April 2021.  I was able to reach out to [facility] and suggest a repeat VAS [place] ABI and vascular consult to determine need for revascularization efforts. She will order if she agrees. MRI bilateral feet has already been ordered and waiting to be performed.   No other needs at this time. Will follow patient as procedure and imaging results finalized and he transitions in care.  Thank you for the opportunity to participate in this patient's care.  Cletis Media RN BSN CWS [place] [phone]

## 2020-03-25 NOTE — ED Notes (Signed)
Report called to [place] RN

## 2020-03-25 NOTE — Progress Notes (Signed)
ABI's have been completed. Preliminary results can be found in CV Proc through chart review.   03/25/20 12:02 PM Thomas [name] RVT

## 2020-03-25 NOTE — Anesthesia Preprocedure Evaluation (Deleted)
Anesthesia Evaluation  Patient identified by MRN, date of birth, ID band Patient awake    Reviewed: Allergy & Precautions, NPO status , Patient's Chart, lab work & pertinent test results  Airway Mallampati: II  TM Distance: >3 FB Neck ROM: Full    Dental no notable dental hx.    Pulmonary neg pulmonary ROS,    Pulmonary exam normal breath sounds clear to auscultation       Cardiovascular hypertension, +CHF  Normal cardiovascular exam+ dysrhythmias Atrial Fibrillation  Rhythm:Regular Rate:Normal  EF 15%   Neuro/Psych negative neurological ROS  negative psych ROS   GI/Hepatic negative GI ROS, Neg liver ROS,   Endo/Other  negative endocrine ROS  Renal/GU negative Renal ROS  negative genitourinary   Musculoskeletal  (+) Arthritis , Rheumatoid disorders,    Abdominal   Peds negative pediatric ROS (+)  Hematology negative hematology ROS (+)   Anesthesia Other Findings   Reproductive/Obstetrics negative OB ROS                             Anesthesia Physical Anesthesia Plan  ASA: IV  Anesthesia Plan: MAC   Post-op Pain Management:    Induction: Intravenous  PONV Risk Score and Plan: 1 and Ondansetron  Airway Management Planned: Simple Face Mask  Additional Equipment:   Intra-op Plan:   Post-operative Plan:   Informed Consent: I have reviewed the patients History and Physical, chart, labs and discussed the procedure including the risks, benefits and alternatives for the proposed anesthesia with the patient or authorized representative who has indicated his/her understanding and acceptance.     Dental advisory given  Plan Discussed with: CRNA and Surgeon  Anesthesia Plan Comments: (Solids at 1700)        Anesthesia Quick Evaluation

## 2020-03-26 ENCOUNTER — Inpatient Hospital Stay (HOSPITAL_COMMUNITY): Payer: Medicare Other | Admitting: Certified Registered Nurse Anesthetist

## 2020-03-26 ENCOUNTER — Telehealth: Payer: Self-pay | Admitting: Podiatry

## 2020-03-26 ENCOUNTER — Encounter (HOSPITAL_COMMUNITY)
Admission: EM | Disposition: A | Discharge status: Home Health | Payer: Self-pay | Source: Home / Self Care | Attending: Internal Medicine

## 2020-03-26 ENCOUNTER — Inpatient Hospital Stay (HOSPITAL_COMMUNITY): Payer: Medicare Other

## 2020-03-26 ENCOUNTER — Encounter (HOSPITAL_COMMUNITY): Payer: Self-pay | Admitting: Internal Medicine

## 2020-03-26 DIAGNOSIS — M86171 Other acute osteomyelitis, right ankle and foot: Secondary | ICD-10-CM

## 2020-03-26 HISTORY — PX: AMPUTATION TOE: SHX6595

## 2020-03-26 HISTORY — PX: IRRIGATION AND DEBRIDEMENT FOOT: SHX6602

## 2020-03-26 LAB — COMPREHENSIVE METABOLIC PANEL
ALT: 15 U/L (ref 0–44)
AST: 13 U/L — ABNORMAL LOW (ref 15–41)
Albumin: 3 g/dL — ABNORMAL LOW (ref 3.5–5.0)
Alkaline Phosphatase: 89 U/L (ref 38–126)
Anion gap: 9 (ref 5–15)
BUN: 18 mg/dL (ref 8–23)
CO2: 28 mmol/L (ref 22–32)
Calcium: 8.8 mg/dL — ABNORMAL LOW (ref 8.9–10.3)
Chloride: 99 mmol/L (ref 98–111)
Creatinine, Ser: 0.74 mg/dL (ref 0.61–1.24)
GFR, Estimated: >60 mL/min (ref >60)
Glucose, Bld: 98 mg/dL (ref 70–99)
Potassium: 3.9 mmol/L (ref 3.5–5.1)
Sodium: 136 mmol/L (ref 135–145)
Total Bilirubin: 0.5 mg/dL (ref 0.3–1.2)
Total Protein: 6.2 g/dL — ABNORMAL LOW (ref 6.5–8.1)

## 2020-03-26 LAB — CBC
HCT: 33.6 % — ABNORMAL LOW (ref 39.0–52.0)
Hemoglobin: 10.7 g/dL — ABNORMAL LOW (ref 13.0–17.0)
MCH: 26.4 pg (ref 26.0–34.0)
MCHC: 31.8 g/dL (ref 30.0–36.0)
MCV: 82.8 fL (ref 80.0–100.0)
Platelets: 223 10*3/uL (ref 150–400)
RBC: 4.06 MIL/uL — ABNORMAL LOW (ref 4.22–5.81)
RDW: 18.6 % — ABNORMAL HIGH (ref 11.5–15.5)
WBC: 6.1 10*3/uL (ref 4.0–10.5)
nRBC: 0 % (ref 0.0–0.2)

## 2020-03-26 LAB — HIV ANTIBODY (ROUTINE TESTING W REFLEX): HIV Screen 4th Generation wRfx: NONREACTIVE

## 2020-03-26 LAB — APTT: aPTT: 66 seconds — ABNORMAL HIGH (ref 24–36)

## 2020-03-26 LAB — MRSA PCR SCREENING: MRSA by PCR: NEGATIVE

## 2020-03-26 IMAGING — DX DG FOOT 2V*L*
2 series · 2 of 2 positions shown · non-contrast
Comparison: [DATE], [DATE]

CLINICAL DATA: Left foot incision and drainage, right third toe
partial amputation, right first metatarsal biopsy

EXAM:
RIGHT FOOT - 2 VIEW; LEFT FOOT - 2 VIEW

[foot ap]
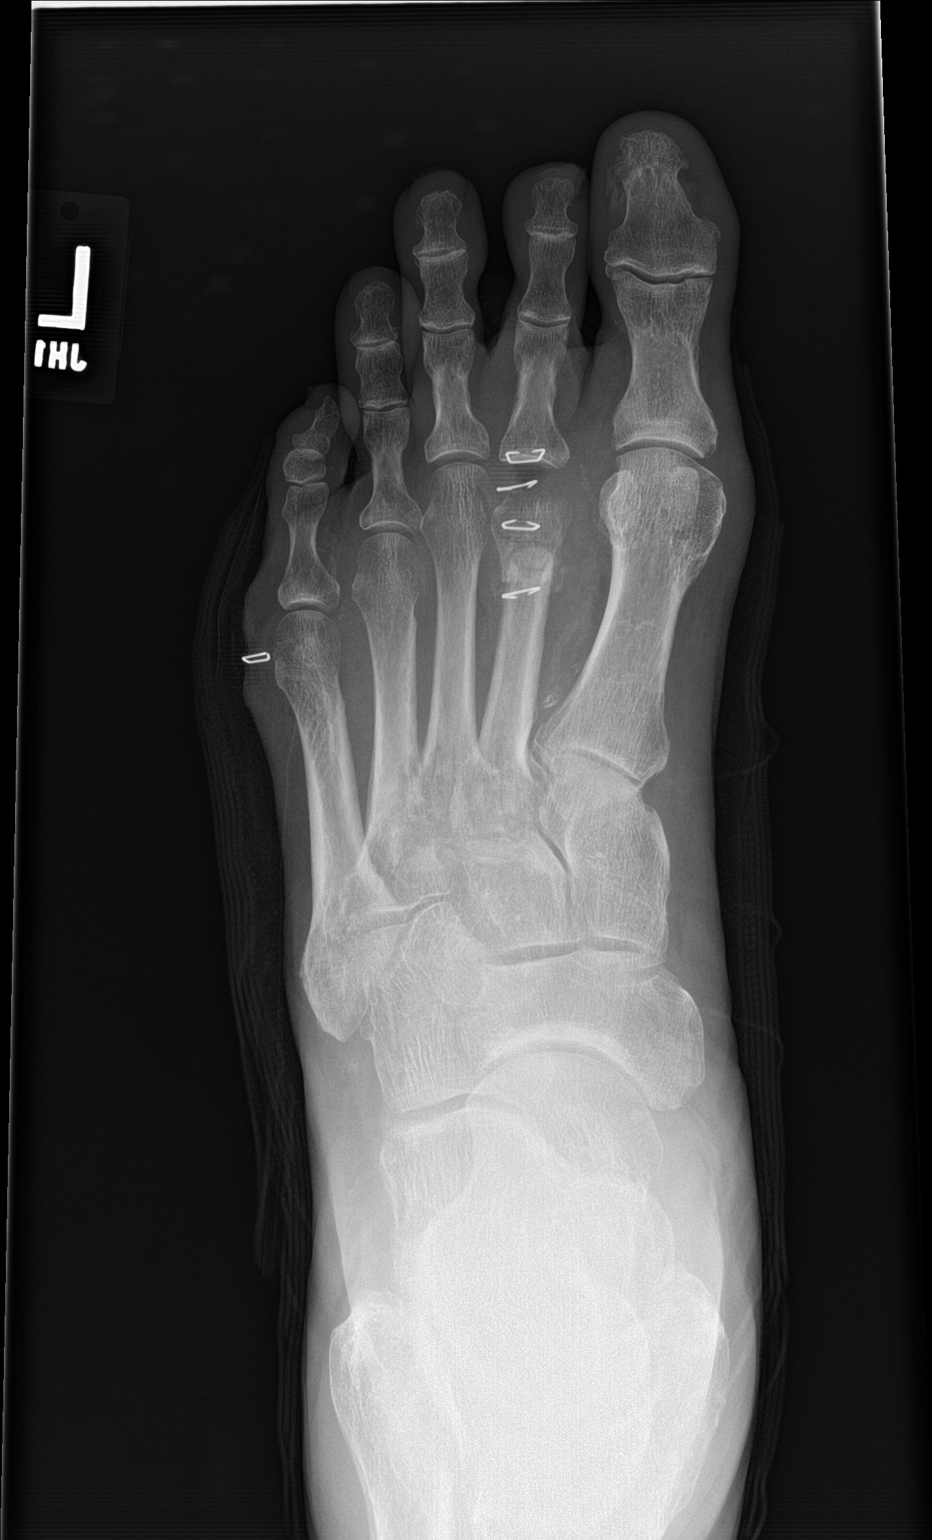

[foot lat]
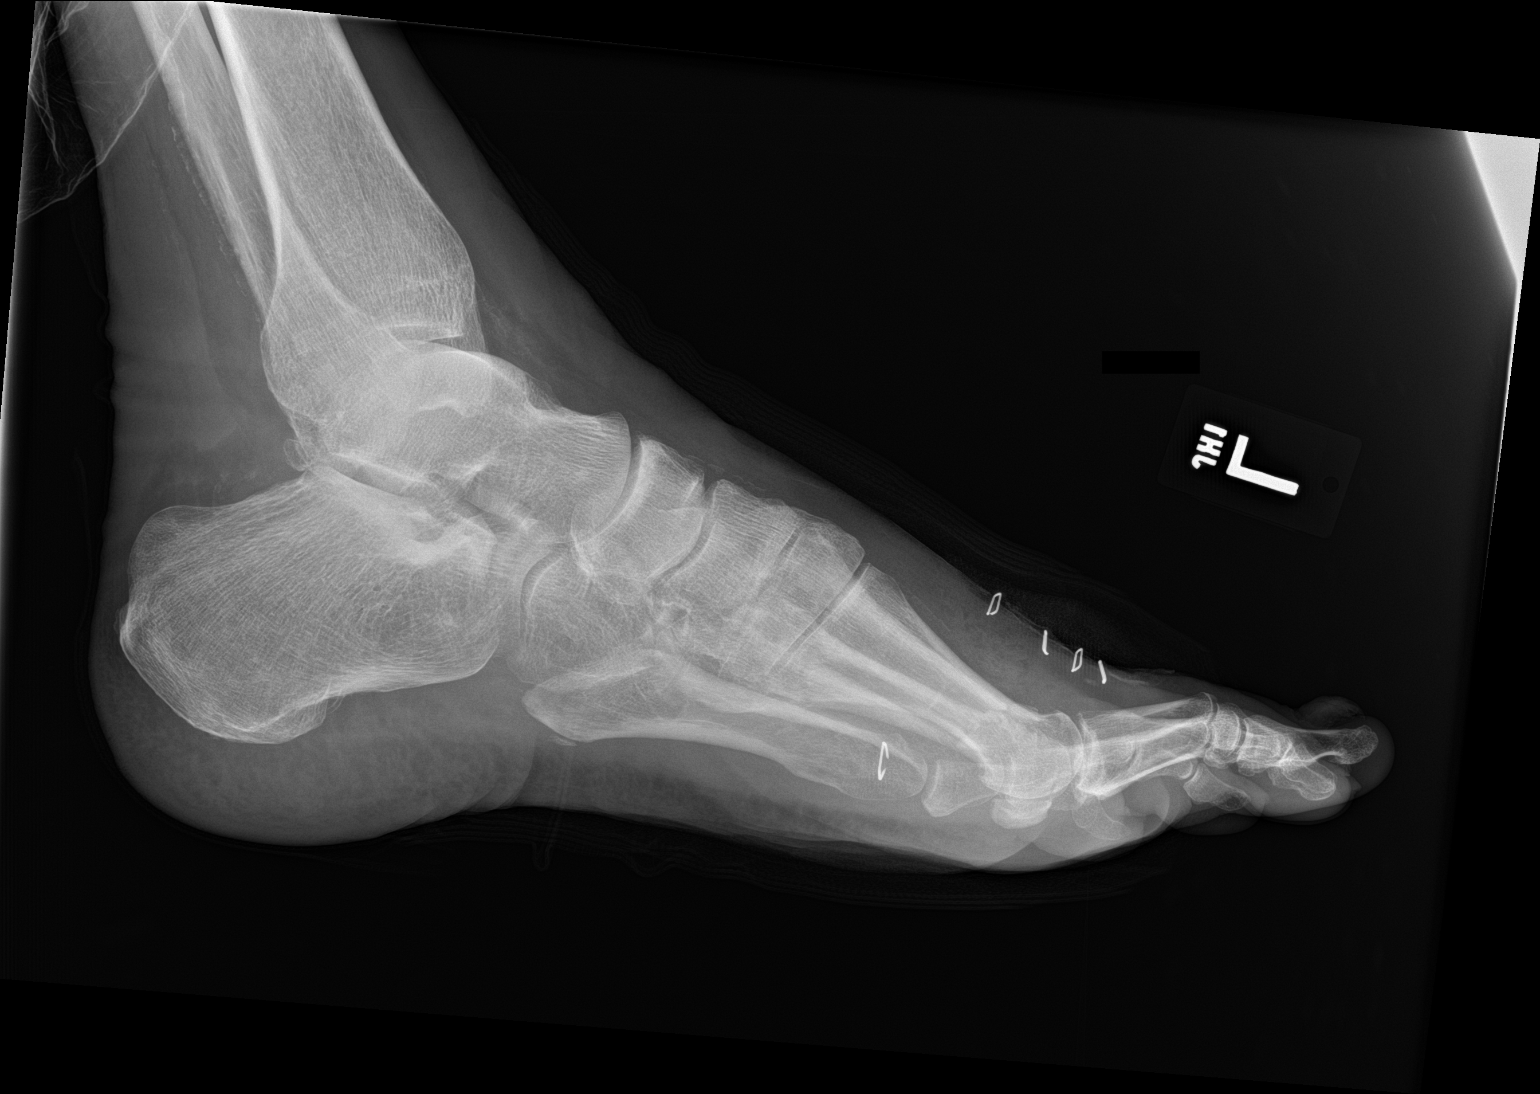

[2 of 2 positions shown; findings below may reference images not displayed]

FINDINGS: Left foot: Frontal and lateral views of the left foot demonstrate
postsurgical changes from incision and drainage of the forefoot.
Partial resection of the head of the second metatarsal. The second
metatarsal fracture seen previously is again noted. Diffuse soft
tissue swelling of the forefoot.

Right foot: Frontal and lateral views demonstrate interval
amputation of the third digit at the level of the proximal
interphalangeal joint. Previous first digit amputation at the
metatarsophalangeal joint. Fracture of the distal margin of the
fifth proximal phalanx is unchanged. There is diffuse soft tissue
swelling of the forefoot.
IMPRESSION: 1. Postsurgical changes left foot related to incision and drainage,
with likely debridement of the second metatarsal head. Stable second
metatarsal fracture.
2. Postsurgical changes from third digit amputation right foot.
Stable fracture distal margin fifth proximal phalanx.

## 2020-03-26 IMAGING — DX DG FOOT 2V*R*
2 series · 2 of 2 positions shown · non-contrast
Comparison: [DATE], [DATE]

CLINICAL DATA: Left foot incision and drainage, right third toe
partial amputation, right first metatarsal biopsy

EXAM:
RIGHT FOOT - 2 VIEW; LEFT FOOT - 2 VIEW

[foot lat]
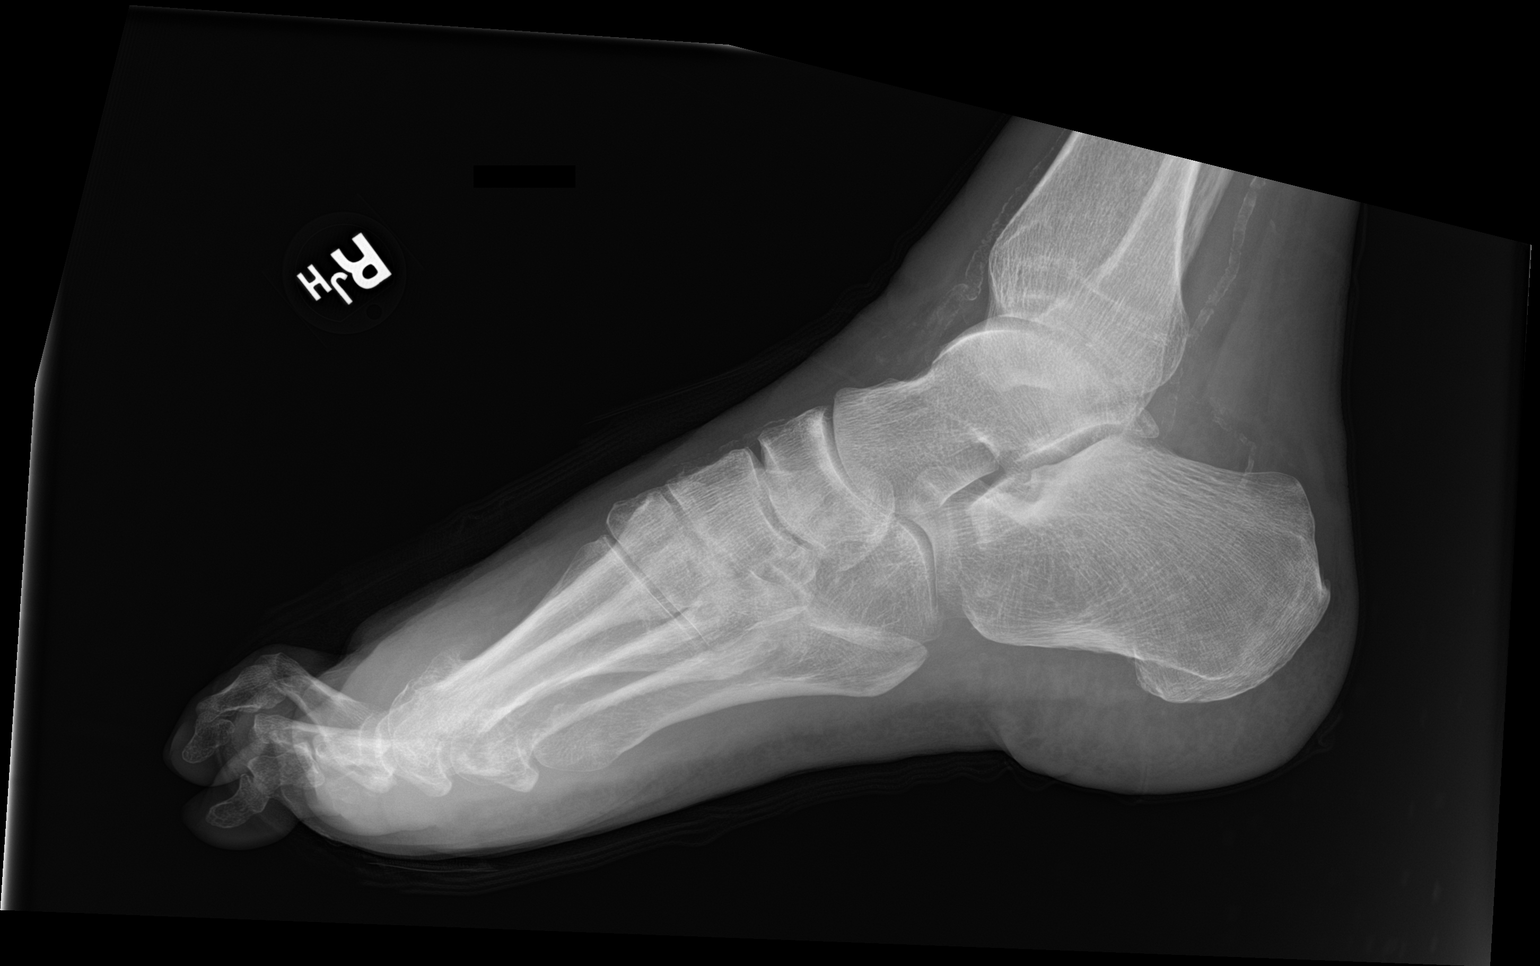

[foot ap]
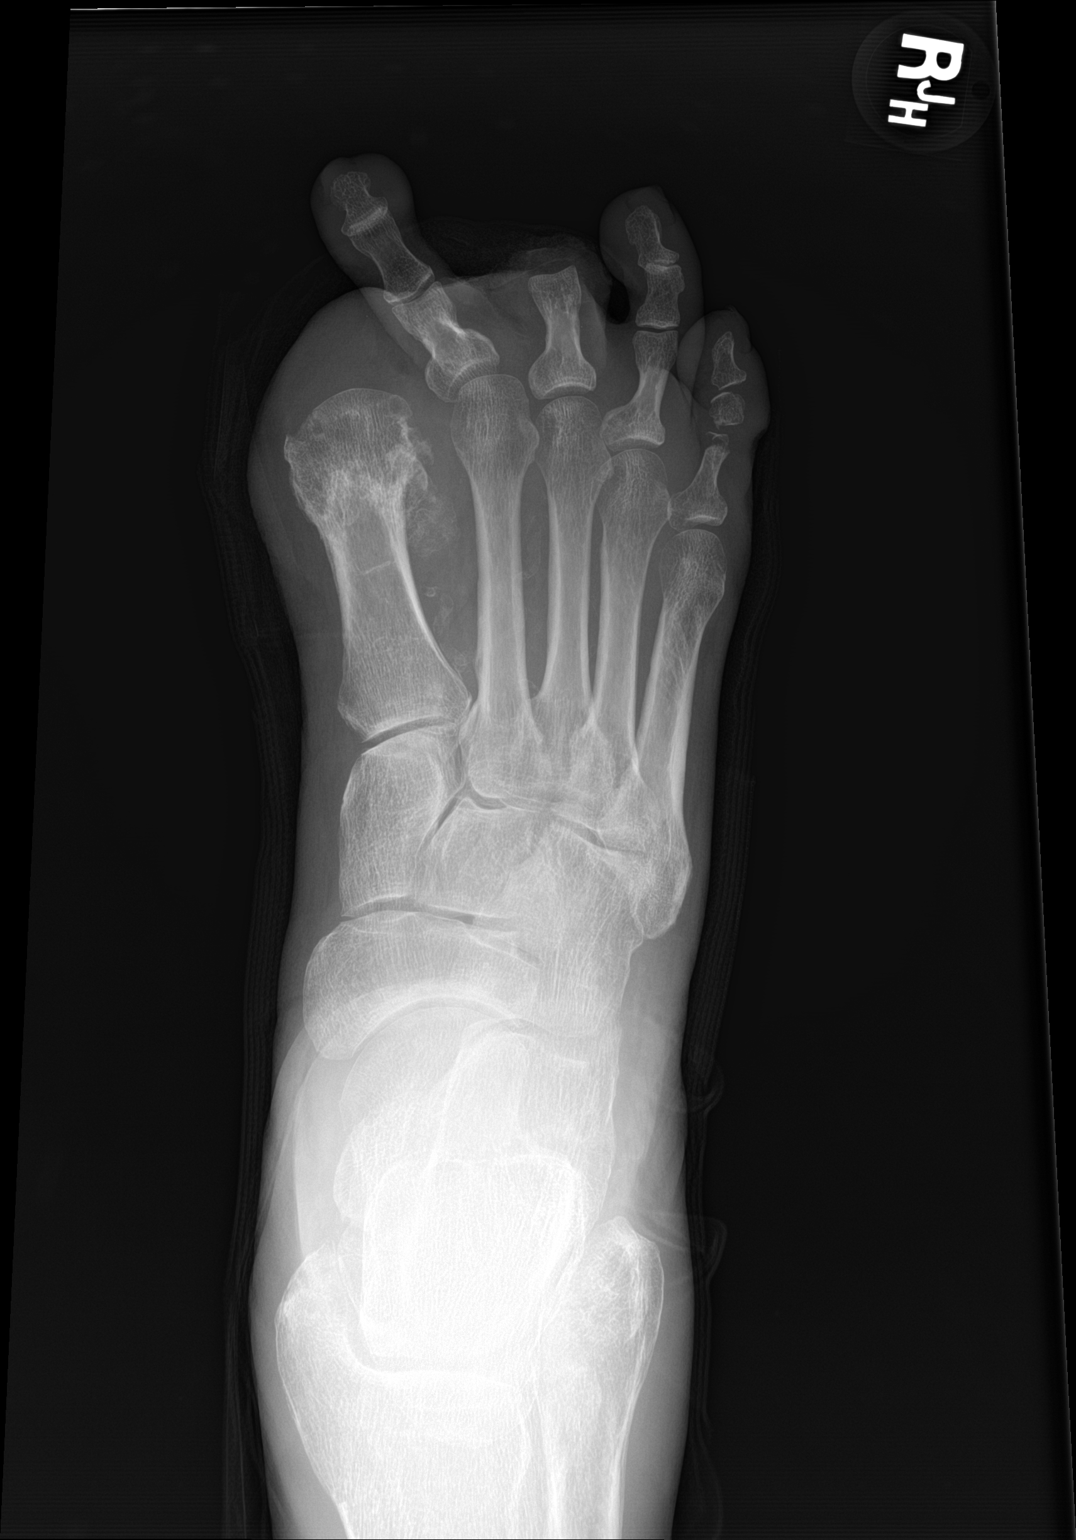

[2 of 2 positions shown; findings below may reference images not displayed]

FINDINGS: Left foot: Frontal and lateral views of the left foot demonstrate
postsurgical changes from incision and drainage of the forefoot.
Partial resection of the head of the second metatarsal. The second
metatarsal fracture seen previously is again noted. Diffuse soft
tissue swelling of the forefoot.

Right foot: Frontal and lateral views demonstrate interval
amputation of the third digit at the level of the proximal
interphalangeal joint. Previous first digit amputation at the
metatarsophalangeal joint. Fracture of the distal margin of the
fifth proximal phalanx is unchanged. There is diffuse soft tissue
swelling of the forefoot.
IMPRESSION: 1. Postsurgical changes left foot related to incision and drainage,
with likely debridement of the second metatarsal head. Stable second
metatarsal fracture.
2. Postsurgical changes from third digit amputation right foot.
Stable fracture distal margin fifth proximal phalanx.

## 2020-03-26 SURGERY — IRRIGATION AND DEBRIDEMENT FOOT
Anesthesia: Monitor Anesthesia Care | Site: Toe | Laterality: Right

## 2020-03-26 MED ORDER — HEPARIN (PORCINE) 25000 UT/250ML-% IV SOLN
1500.0000 [IU]/h | INTRAVENOUS | Status: DC
  Administered 2020-03-26 – 2020-03-27 (×2): 1500 [IU]/h via INTRAVENOUS
  Filled 2020-03-26: qty 250

## 2020-03-26 MED ORDER — BUPIVACAINE HCL (PF) 0.5 % IJ SOLN
INTRAMUSCULAR | Status: AC
  Filled 2020-03-26: qty 30

## 2020-03-26 MED ORDER — PREDNISONE 10 MG PO TABS
10.0000 mg | ORAL_TABLET | Freq: Every day | ORAL | Status: DC
  Administered 2020-03-26 – 2020-03-29 (×4): 10 mg via ORAL
  Filled 2020-03-26 (×5): qty 1

## 2020-03-26 MED ORDER — VANCOMYCIN HCL 1000 MG IV SOLR
INTRAVENOUS | Status: AC
  Filled 2020-03-26: qty 1000

## 2020-03-26 MED ORDER — OXYCODONE-ACETAMINOPHEN 10-325 MG PO TABS: 1.0000 | ORAL_TABLET | ORAL | Status: DC

## 2020-03-26 MED ORDER — OXYCODONE HCL 5 MG PO TABS
5.0000 mg | ORAL_TABLET | ORAL | Status: DC
  Administered 2020-03-26 – 2020-03-29 (×19): 5 mg via ORAL
  Filled 2020-03-26 (×19): qty 1

## 2020-03-26 MED ORDER — PROPOFOL 10 MG/ML IV BOLUS
INTRAVENOUS | Status: AC
  Filled 2020-03-26: qty 20

## 2020-03-26 MED ORDER — OXYCODONE HCL 5 MG/5ML PO SOLN: 5.0000 mg | Freq: Once | ORAL | Status: DC | PRN

## 2020-03-26 MED ORDER — LACTATED RINGERS IV SOLN: INTRAVENOUS | Status: DC

## 2020-03-26 MED ORDER — FENTANYL CITRATE (PF) 100 MCG/2ML IJ SOLN
INTRAMUSCULAR | Status: AC
  Filled 2020-03-26: qty 2

## 2020-03-26 MED ORDER — FENTANYL CITRATE (PF) 100 MCG/2ML IJ SOLN
INTRAMUSCULAR | Status: DC | PRN
  Administered 2020-03-26: 25 ug via INTRAVENOUS
  Administered 2020-03-26: 50 ug via INTRAVENOUS

## 2020-03-26 MED ORDER — FENTANYL CITRATE (PF) 100 MCG/2ML IJ SOLN
25.0000 ug | INTRAMUSCULAR | Status: DC | PRN
  Administered 2020-03-26 (×2): 50 ug via INTRAVENOUS

## 2020-03-26 MED ORDER — LACTATED RINGERS IR SOLN
Status: DC | PRN
  Administered 2020-03-26: 1000 mL

## 2020-03-26 MED ORDER — VANCOMYCIN HCL 1000 MG IV SOLR
INTRAVENOUS | Status: DC | PRN
  Administered 2020-03-26 (×2): 500 mg

## 2020-03-26 MED ORDER — OXYCODONE-ACETAMINOPHEN 5-325 MG PO TABS
1.0000 | ORAL_TABLET | ORAL | Status: DC
  Administered 2020-03-26 – 2020-03-29 (×20): 1 via ORAL
  Filled 2020-03-26 (×20): qty 1

## 2020-03-26 MED ORDER — 0.9 % SODIUM CHLORIDE (POUR BTL) OPTIME
TOPICAL | Status: DC | PRN
  Administered 2020-03-26: 1000 mL

## 2020-03-26 MED ORDER — MIDAZOLAM HCL 5 MG/5ML IJ SOLN
INTRAMUSCULAR | Status: DC | PRN
  Administered 2020-03-26: 2 mg via INTRAVENOUS

## 2020-03-26 MED ORDER — ONDANSETRON HCL 4 MG/2ML IJ SOLN: 4.0000 mg | Freq: Once | INTRAMUSCULAR | Status: DC | PRN

## 2020-03-26 MED ORDER — LACTATED RINGERS IV SOLN: INTRAVENOUS | Status: DC | PRN

## 2020-03-26 MED ORDER — BUPIVACAINE HCL (PF) 0.5 % IJ SOLN
INTRAMUSCULAR | Status: DC | PRN
  Administered 2020-03-26 (×2): 10 mL

## 2020-03-26 MED ORDER — MIDAZOLAM HCL 2 MG/2ML IJ SOLN
INTRAMUSCULAR | Status: AC
  Filled 2020-03-26: qty 2

## 2020-03-26 MED ORDER — PROPOFOL 500 MG/50ML IV EMUL
INTRAVENOUS | Status: DC | PRN
  Administered 2020-03-26: 75 ug/kg/min via INTRAVENOUS

## 2020-03-26 MED ORDER — OXYCODONE HCL 5 MG PO TABS: 5.0000 mg | ORAL_TABLET | Freq: Once | ORAL | Status: DC | PRN

## 2020-03-26 SURGICAL SUPPLY — 38 items
BLADE OSCILLATING/SAGITTAL (BLADE) ×2
BLADE SURG 15 STRL LF DISP TIS (BLADE) IMPLANT
BLADE SURG 15 STRL SS (BLADE)
BLADE SW THK.38XMED LNG THN (BLADE) ×2 IMPLANT
BNDG CONFORM 4 STRL LF (GAUZE/BANDAGES/DRESSINGS) ×6 IMPLANT
BNDG ELASTIC 4X5.8 VLCR STR LF (GAUZE/BANDAGES/DRESSINGS) IMPLANT
BNDG ELASTIC 6X10 VLCR STRL LF (GAUZE/BANDAGES/DRESSINGS) ×6 IMPLANT
BNDG ELASTIC 6X5.8 VLCR STR LF (GAUZE/BANDAGES/DRESSINGS) ×3 IMPLANT
CLEANER TIP ELECTROSURG 2X2 (MISCELLANEOUS) IMPLANT
CNTNR URN SCR LID CUP LEK RST (MISCELLANEOUS) IMPLANT
CONT SPEC 4OZ STRL OR WHT (MISCELLANEOUS)
COVER SURGICAL LIGHT HANDLE (MISCELLANEOUS) ×3 IMPLANT
COVER WAND RF STERILE (DRAPES) IMPLANT
CUFF TOURN SGL QUICK 24 (TOURNIQUET CUFF)
CUFF TRNQT CYL 24X4X16.5-23 (TOURNIQUET CUFF) IMPLANT
DECANTER SPIKE VIAL GLASS SM (MISCELLANEOUS) IMPLANT
GAUZE SPONGE 4X4 12PLY STRL (GAUZE/BANDAGES/DRESSINGS) ×6 IMPLANT
GAUZE XEROFORM 1X8 LF (GAUZE/BANDAGES/DRESSINGS) ×6 IMPLANT
GLOVE BIO SURGEON STRL SZ7.5 (GLOVE) ×6 IMPLANT
GOWN STRL REUS W/TWL XL LVL3 (GOWN DISPOSABLE) ×3 IMPLANT
HANDPIECE INTERPULSE COAX TIP (DISPOSABLE) ×3
KIT BASIN OR (CUSTOM PROCEDURE TRAY) ×3 IMPLANT
KIT TURNOVER KIT A (KITS) IMPLANT
NEEDLE BIOPSY JAMSHIDI 11X6 (NEEDLE) ×6 IMPLANT
NEEDLE HYPO 25X1 1.5 SAFETY (NEEDLE) ×3 IMPLANT
PACK ORTHO EXTREMITY (CUSTOM PROCEDURE TRAY) ×3 IMPLANT
PADDING UNDERCAST 2 STRL (CAST SUPPLIES) ×1
PADDING UNDERCAST 2X4 STRL (CAST SUPPLIES) ×2 IMPLANT
PENCIL SMOKE EVACUATOR (MISCELLANEOUS) IMPLANT
SET HNDPC FAN SPRY TIP SCT (DISPOSABLE) ×2 IMPLANT
STAPLER VISISTAT 35W (STAPLE) ×3 IMPLANT
SUT ETHILON 4 0 PS 2 18 (SUTURE) ×9 IMPLANT
SUT VIC AB 4-0 PS2 27 (SUTURE) ×3 IMPLANT
SYR 20ML LL LF (SYRINGE) IMPLANT
TOWEL OR 17X26 10 PK STRL BLUE (TOWEL DISPOSABLE) ×3 IMPLANT
TOWEL OR NON WOVEN STRL DISP B (DISPOSABLE) ×3 IMPLANT
TRAY PREP A LATEX SAFE STRL (SET/KITS/TRAYS/PACK) ×3 IMPLANT
UNDERPAD 30X36 HEAVY ABSORB (UNDERPADS AND DIAPERS) ×6 IMPLANT

## 2020-03-26 NOTE — Progress Notes (Incomplete)
Patient expressed concerns regarding pain medication plan. Provider updated and will assess.

## 2020-03-26 NOTE — Progress Notes (Signed)
Patient complained of pain. Discussed pain management plan with pt. Pt stated "If I'm not going to get my pain medications the way I want, I'll just have my wife bring them." Discuss medication administration policy with pt. Pt made aware that his spouse is not allowed to bring medications for administration without provider approval. Will update provider.

## 2020-03-26 NOTE — Consult Note (Signed)
Reason for Consult: bilateral foot wounds/infections Referring Physician: Taylen Osorto is an 69 y.o. male.  HPI: This is a 69 year old male known to provider's practice with concern of wounds to both feet. He was sent from the office 03/24/20 for IV and MRI and likely surgery.  Patient understands need for surgery and would like to know when it would be.  Past Medical History:  Diagnosis Date  . Atrial flutter ([place]) 01/2020  . Cancer [facility])    prostate  . Claustrophobia    quite severe  . Heart failure with reduced ejection fraction ([place])   . Hypertension   . Hypoglycemia    occ  . Left knee DJD    Xray 12/23/08  . NICM (nonischemic cardiomyopathy) ([place]) 02/12/2020  . Prostate cancer [facility])     Past Surgical History:  Procedure Laterality Date  . AMPUTATION TOE Right 12/2019  . BUBBLE STUDY  02/11/2020   Procedure: BUBBLE STUDY;  Surgeon: Geralynn Rile, MD;  Location: [place];  Service: Cardiovascular;;  . CARDIOVERSION N/A 02/11/2020   Procedure: CARDIOVERSION;  Surgeon: Geralynn Rile, MD;  Location: [place];  Service: Cardiovascular;  Laterality: N/A;  . CYSTOSCOPY  04/11/2018   Procedure: Erlene Quan;  Surgeon: [name], MD;  Location: [facility];  Service: Urology;;  NO SEEDS FOUND IN BLADDER  . HERNIA REPAIR  2009   inguinal  . JOINT REPLACEMENT Bilateral 2017   knees  . RADIOACTIVE SEED IMPLANT N/A 04/11/2018   Procedure: RADIOACTIVE SEED IMPLANT/BRACHYTHERAPY IMPLANT;  Surgeon: [name], MD;  Location: [facility];  Service: Urology;  Laterality: N/A;   71     SEEDS IMPLANTED  . RIGHT/LEFT HEART CATH AND CORONARY ANGIOGRAPHY N/A 02/09/2020   Procedure: RIGHT/LEFT HEART CATH AND CORONARY ANGIOGRAPHY;  Surgeon: Nelva Bush, MD;  Location: [place];  Service: Cardiovascular;  Laterality: N/A;  . SPACE OAR INSTILLATION N/A 04/11/2018   Procedure: SPACE OAR  INSTILLATION;  Surgeon: [name], MD;  Location[facility];  Service: Urology;  Laterality: N/A;  . TEE WITHOUT CARDIOVERSION N/A 02/11/2020   Procedure: TRANSESOPHAGEAL ECHOCARDIOGRAM (TEE);  Surgeon: Geralynn Rile, MD;  Location: [place];  Service: Cardiovascular;  Laterality: N/A;  . TOTAL KNEE ARTHROPLASTY Bilateral 09/13/2015   Procedure: BILATERAL KNEE ARTHROPLASTY ;  Surgeon: Paralee Cancel, MD;  Location: [facility];  Service: Orthopedics;  Laterality: Bilateral;    Family History  Problem Relation Age of Onset  . Prostate cancer Father   . Prostate cancer Brother   . Colon cancer Neg Hx   . Rectal cancer Neg Hx   . Stomach cancer Neg Hx     Social History:  reports that he has never smoked. He has never used smokeless tobacco. He reports previous drug use. He reports that he does not drink alcohol.  Allergies:  Allergies  Allergen Reactions  . Chlorthalidone Other (See Comments)    "Makes me light-headed and I don't like the way it makes me feel"  . Voltaren [Diclofenac] Rash    Medications: I have reviewed the patient's current medications.  Results for orders placed or performed during the hospital encounter of 03/25/20 (from the past 48 hour(s))  CBC with Differential     Status: Abnormal   Collection Time: 03/25/20  9:54 AM  Result Value Ref Range   WBC 13.0 (H) 4.0 - 10.5 K/uL   RBC 4.45 4.22 - 5.81 MIL/uL  Hemoglobin 11.5 (L) 13.0 - 17.0 g/dL   HCT 36.8 (L) 39 - 52 %   MCV 82.7 80.0 - 100.0 fL   MCH 25.8 (L) 26.0 - 34.0 pg   MCHC 31.3 30.0 - 36.0 g/dL   RDW 18.8 (H) 11.5 - 15.5 %   Platelets 251 150 - 400 K/uL   nRBC 0.0 0.0 - 0.2 %   Neutrophils Relative % 89 %   Neutro Abs 11.7 (H) 1.7 - 7.7 K/uL   Lymphocytes Relative 4 %   Lymphs Abs 0.5 (L) 0.7 - 4.0 K/uL   Monocytes Relative 6 %   Monocytes Absolute 0.7 0.1 - 1.0 K/uL   Eosinophils Relative 0 %   Eosinophils Absolute 0.0 0.0 - 0.5 K/uL   Basophils Relative 0 %    Basophils Absolute 0.0 0.0 - 0.1 K/uL   Immature Granulocytes 1 %   Abs Immature Granulocytes 0.09 (H) 0.00 - 0.07 K/uL    Comment: Performed at [facility], [place] [address], [place], [place] [address]  Culture, blood (routine x 2)     Status: None (Preliminary result)   Collection Time: 03/25/20  9:54 AM   Specimen: BLOOD  Result Value Ref Range   Specimen Description      BLOOD LEFT ARM Performed at [facility] [address] [place], [place] [address]    Special Requests      BOTTLES DRAWN AEROBIC AND ANAEROBIC Blood Culture adequate volume Performed at [facility] [address], [place], [place] [address]    Culture      NO GROWTH < 24 HOURS Performed at [facility] [address] [place], [place] [address]    Report Status PENDING   Resp Panel by RT-PCR (Flu A&B, Covid) Nasopharyngeal Swab     Status: None   Collection Time: 03/25/20  9:54 AM   Specimen: Nasopharyngeal Swab; Nasopharyngeal(NP) swabs in vial transport medium  Result Value Ref Range   SARS Coronavirus 2 by RT PCR NEGATIVE NEGATIVE    Comment: (NOTE) SARS-CoV-2 target nucleic acids are NOT DETECTED.  The SARS-CoV-2 RNA is generally detectable in upper respiratory specimens during the acute phase of infection. The lowest concentration of SARS-CoV-2 viral copies this assay can detect is 138 copies/mL. A negative result does not preclude SARS-Cov-2 infection and should not be used as the sole basis for treatment or other patient management decisions. A negative result may occur with  improper specimen collection/handling, submission of specimen other than nasopharyngeal swab, presence of viral mutation(s) within the areas targeted by this assay, and inadequate number of viral copies(<138 copies/mL). A negative result must be combined with clinical observations, patient history, and epidemiological information. The expected result is  Negative.  Fact Sheet for Patients:  EntrepreneurPulse.com.au  Fact Sheet for Healthcare Providers:  IncredibleEmployment.be  This test is no t yet approved or cleared by the [place] FDA and  has been authorized for detection and/or diagnosis of SARS-CoV-2 by FDA under an Emergency Use Authorization (EUA). This EUA will remain  in effect (meaning this test can be used) for the duration of the COVID-19 declaration under Section 564(b)(1) of the Act, 21 U.S.C.section 360bbb-3(b)(1), unless the authorization is terminated  or revoked sooner.       Influenza A by PCR NEGATIVE NEGATIVE   Influenza B by PCR NEGATIVE NEGATIVE    Comment: (NOTE) The Xpert Xpress SARS-CoV-2/FLU/RSV plus assay is intended as an aid in the diagnosis of  influenza from Nasopharyngeal swab specimens and should not be used as a sole basis for treatment. Nasal washings and aspirates are unacceptable for Xpert Xpress SARS-CoV-2/FLU/RSV testing.  Fact Sheet for Patients: EntrepreneurPulse.com.au  Fact Sheet for Healthcare Providers: IncredibleEmployment.be  This test is not yet approved or cleared by the [place] FDA and has been authorized for detection and/or diagnosis of SARS-CoV-2 by FDA under an Emergency Use Authorization (EUA). This EUA will remain in effect (meaning this test can be used) for the duration of the COVID-19 declaration under Section 564(b)(1) of the Act, 21 U.S.C. section 360bbb-3(b)(1), unless the authorization is terminated or revoked.  Performed at [facility], [place] [address] [place], [place] [address]   Comprehensive metabolic panel     Status: Abnormal   Collection Time: 03/25/20  9:54 AM  Result Value Ref Range   Sodium 134 (L) 135 - 145 mmol/L   Potassium 4.6 3.5 - 5.1 mmol/L   Chloride 94 (L) 98 - 111 mmol/L   CO2 29 22 - 32 mmol/L   Glucose, Bld 132 (H) 70 - 99 mg/dL     Comment: Glucose reference range applies only to samples taken after fasting for at least 8 hours.   BUN 18 8 - 23 mg/dL   Creatinine, Ser 0.79 0.61 - 1.24 mg/dL   Calcium 8.8 (L) 8.9 - 10.3 mg/dL   Total Protein 7.4 6.5 - 8.1 g/dL   Albumin 3.6 3.5 - 5.0 g/dL   AST 18 15 - 41 U/L   ALT 17 0 - 44 U/L   Alkaline Phosphatase 108 38 - 126 U/L   Total Bilirubin 1.0 0.3 - 1.2 mg/dL   GFR, Estimated >60 >60 mL/min    Comment: (NOTE) Calculated using the CKD-EPI Creatinine Equation (2021)    Anion gap 11 5 - 15    Comment: Performed at [facility], [place] [address] [place], [place] [address]  Lactic acid     Status: None   Collection Time: 03/25/20  9:54 AM  Result Value Ref Range   Lactic Acid, Venous 1.4 0.5 - 1.9 mmol/L    Comment: Performed at [facility], [place] [address] [place], [place] [address]  Culture, blood (routine x 2)     Status: None (Preliminary result)   Collection Time: 03/25/20  9:55 AM   Specimen: BLOOD  Result Value Ref Range   Specimen Description      BLOOD RIGHT ARM Performed at [facility] [address], [place], [place] [address]    Special Requests      BOTTLES DRAWN AEROBIC AND ANAEROBIC Blood Culture results may not be optimal due to an inadequate volume of blood received in culture bottles Performed at [facility], [place] [address] [place], [place] [address]    Culture      NO GROWTH < 24 HOURS Performed at [facility] [address] [place], [place] [address]    Report Status PENDING   APTT     Status: Abnormal   Collection Time: 03/25/20  2:50 PM  Result Value Ref Range   aPTT 53 (H) 24 - 36 seconds    Comment:        IF BASELINE aPTT IS ELEVATED, SUGGEST PATIENT RISK ASSESSMENT BE USED TO DETERMINE APPROPRIATE ANTICOAGULANT THERAPY. Performed at [facility], [place] [address], [place], [place] [address]   Protime-INR     Status: Abnormal    Collection  Time: 03/25/20  2:50 PM  Result Value Ref Range   Prothrombin Time 15.2 11.4 - 15.2 seconds   INR 1.3 (H) 0.8 - 1.2    Comment: (NOTE) INR goal varies based on device and disease states. Performed at [facility], [place] [address], [place], [place] [address]   Heparin level (unfractionated)     Status: Abnormal   Collection Time: 03/25/20  2:50 PM  Result Value Ref Range   Heparin Unfractionated 1.84 (H) 0.30 - 0.70 IU/mL    Comment: RESULTS CONFIRMED BY MANUAL DILUTION Performed at [facility] [address] [place], [place] [address]   APTT     Status: Abnormal   Collection Time: 03/25/20 10:46 PM  Result Value Ref Range   aPTT 68 (H) 24 - 36 seconds    Comment:        IF BASELINE aPTT IS ELEVATED, SUGGEST PATIENT RISK ASSESSMENT BE USED TO DETERMINE APPROPRIATE ANTICOAGULANT THERAPY. Performed at [facility], [place] [address] [place], [place] [address]   HIV Antibody (routine testing w rflx)     Status: None   Collection Time: 03/25/20 10:46 PM  Result Value Ref Range   HIV Screen 4th Generation wRfx Non Reactive Non Reactive    Comment: Performed at [facility], [place] [address], [place], [place] [address]  CBC     Status: Abnormal   Collection Time: 03/26/20  4:06 AM  Result Value Ref Range   WBC 6.1 4.0 - 10.5 K/uL   RBC 4.06 (L) 4.22 - 5.81 MIL/uL   Hemoglobin 10.7 (L) 13.0 - 17.0 g/dL   HCT 33.6 (L) 39 - 52 %   MCV 82.8 80.0 - 100.0 fL   MCH 26.4 26.0 - 34.0 pg   MCHC 31.8 30.0 - 36.0 g/dL   RDW 18.6 (H) 11.5 - 15.5 %   Platelets 223 150 - 400 K/uL   nRBC 0.0 0.0 - 0.2 %    Comment: Performed at [facility], [place] [address] [place], [place] [address]  Comprehensive metabolic panel     Status: Abnormal   Collection Time: 03/26/20  4:06 AM  Result Value Ref Range   Sodium 136 135 - 145 mmol/L   Potassium 3.9 3.5 - 5.1 mmol/L   Chloride 99 98 - 111 mmol/L   CO2 28  22 - 32 mmol/L   Glucose, Bld 98 70 - 99 mg/dL    Comment: Glucose reference range applies only to samples taken after fasting for at least 8 hours.   BUN 18 8 - 23 mg/dL   Creatinine, Ser 0.74 0.61 - 1.24 mg/dL   Calcium 8.8 (L) 8.9 - 10.3 mg/dL   Total Protein 6.2 (L) 6.5 - 8.1 g/dL   Albumin 3.0 (L) 3.5 - 5.0 g/dL   AST 13 (L) 15 - 41 U/L   ALT 15 0 - 44 U/L   Alkaline Phosphatase 89 38 - 126 U/L   Total Bilirubin 0.5 0.3 - 1.2 mg/dL   GFR, Estimated >60 >60 mL/min    Comment: (NOTE) Calculated using the CKD-EPI Creatinine Equation (2021)    Anion gap 9 5 - 15    Comment: Performed at [facility], [place] [address], [place], [place] [address]  APTT     Status: Abnormal   Collection Time: 03/26/20  4:06 AM  Result Value Ref Range   aPTT 66 (H) 24 - 36 seconds    Comment:  IF BASELINE aPTT IS ELEVATED, SUGGEST PATIENT RISK ASSESSMENT BE USED TO DETERMINE APPROPRIATE ANTICOAGULANT THERAPY. Performed at [facility], [place] [address] [place], [place] [address]     MR FOOT RIGHT W WO CONTRAST  Result Date: 03/25/2020 CLINICAL DATA:  Diabetes, foot swelling. Prior amputation of the great toe. Ulcer along the third toe. EXAM: MRI OF THE RIGHT FOREFOOT WITHOUT AND WITH CONTRAST TECHNIQUE: Multiplanar, multisequence MR imaging of the right forefoot was performed before and after the administration of intravenous contrast. CONTRAST:  20mL GADAVIST GADOBUTROL 1 MMOL/ML IV SOLN COMPARISON:  Multiple exams, including radiographs from 03/03/2020 FINDINGS: Bones/Joint/Cartilage Absent great toe. 1.3 by 1.7 cm erosion along the plantar head of the first metatarsal with a notable surrounding edema in the metatarsal head and distal shaft, suspicious for active osteomyelitis for example on image 26 of series 7. Nonvisualization of the medial first digit sesamoid, probably eroded. There is edema in the lateral sesamoid of the first digit. Prominent edema and  enhancement in the distal phalanx of the third toe. Possible midshaft fracture versus bony destruction. Surrounding subcutaneous edema and enhancement in the third toe. Mild deformity of the distal metaphysis of the proximal phalanx third toe likely from an old fracture. Low-grade marrow edema is present in the head of the second metatarsal. Abnormal marrow edema and enhancement is present in the proximal, middle, and distal phalanges of the small toe with associated subcutaneous edema and enhancement. Possible fracture involving the distal head of the proximal phalanx of the small toe. Appearance concerning for osteomyelitis of the small toe. Ligaments Lisfranc ligament intact. Muscles and Tendons Diffuse low-grade edema and enhancement in the visualized musculature of the forefoot, potentially from myositis or neurogenic edema. Thickening of the detached flexor hallucis longus tendon. Soft tissues Extensive edema and enhancement tracking in the dorsum of the foot and in the remaining toes, favoring cellulitis. I not observe a drainable abscess. There is some hypoenhancement in the plantar tissues along the ball of the foot below the second and third MTP joints, without visualized gas in the soft tissues, correlate with visual inspection in assessing for tissue necrosis. IMPRESSION: 1. Osteomyelitis of the head and distal shaft of the first metatarsal. 2. Prominent edema and enhancement in the distal phalanx of the third toe, favoring osteomyelitis, with possible midshaft fracture versus bony destruction. 3. Abnormal marrow edema and enhancement in the proximal, middle, and distal phalanges of the small toe, with associated subcutaneous edema and enhancement. Appearance favors osteomyelitis. Possible fracture involving the distal head of the proximal phalanx of the small toe. 4. Extensive edema and enhancement in the dorsum of the foot and in the remaining toes, favoring cellulitis. No drainable abscess. 5. Diffuse  low-grade edema and enhancement in the visualized musculature of the forefoot, potentially from myositis or neurogenic edema. 6. Thickening of the detached flexor hallucis longus tendon, compatible with prior amputation. 7. There is some hypoenhancement in the plantar tissues along the ball of the foot below the second and third MTP joints, correlate with visual inspection in assessing for tissue necrosis. 8. Low-grade edema signal and enhancement in the head of the second metatarsal and adjacent base of the proximal phalanx second toe, possibly degenerative or reactive. Electronically Signed   By: Van Clines M.D.   On: 03/25/2020 14:36   MR FOOT LEFT W WO CONTRAST  Result Date: 03/25/2020 CLINICAL DATA:  Left foot pain and swelling.  Diabetic foot ulcers. EXAM: MRI OF THE LEFT FOREFOOT WITHOUT AND WITH  CONTRAST TECHNIQUE: Multiplanar, multisequence MR imaging of the left foot was performed both before and after administration of intravenous contrast. CONTRAST:  51mL GADAVIST GADOBUTROL 1 MMOL/ML IV SOLN COMPARISON:  9 cc Gadavist FINDINGS: Diffuse abnormal T1 and T2 signal intensity in the second metatarsal shaft and neck highly suspicious for osteomyelitis with an associated pathologic fracture through the metacarpal neck region. Marked surrounding soft tissue swelling/edema and rim enhancing fluid containing gas, consistent with an abscess. There is also a small open wound along the lateral aspect of the forefoot overlying the region of the fifth metatarsal head. There is associated abnormal T1 and T2 signal intensity in the fifth metacarpal head and subsequent contrast enhancement consistent with osteomyelitis. I do not see any definite findings for septic arthritis at the fifth MTP joint. Diffuse and fairly marked subcutaneous soft tissue swelling/edema/fluid consistent with cellulitis. No subcutaneous abscesses are identified. There is also diffuse myofasciitis without definite findings for  pyomyositis. IMPRESSION: 1. Findings consistent with osteomyelitis involving the second metatarsal shaft and neck with an associated pathologic fracture through the metacarpal neck region. There is a surrounding abscess. 2. Small small open wound along the lateral aspect of the forefoot with underlying osteomyelitis involving the fifth metatarsal head. 3. Diffuse and fairly marked subcutaneous soft tissue swelling/edema/fluid consistent with cellulitis. Electronically Signed   By: Marijo Sanes M.D.   On: 03/25/2020 14:40   VAS [place] ABI WITH/WO TBI  Result Date: 03/25/2020 LOWER EXTREMITY DOPPLER STUDY Indications: Ulceration. High Risk Factors: None.  Limitations: Today's exam was limited due to Right great toe amputation. Comparison Study: No prior studies. Performing Technologist: Carlos Levering RVT  Examination Guidelines: A complete evaluation includes at minimum, Doppler waveform signals and systolic blood pressure reading at the level of bilateral brachial, anterior tibial, and posterior tibial arteries, when vessel segments are accessible. Bilateral testing is considered an integral part of a complete examination. Photoelectric Plethysmograph (PPG) waveforms and toe systolic pressure readings are included as required and additional duplex testing as needed. Limited examinations for reoccurring indications may be performed as noted.  ABI Findings: +--------+------------------+-----+---------+--------+ Right   Rt Pressure (mmHg)IndexWaveform Comment  +--------+------------------+-----+---------+--------+ [id number]                    triphasic         +--------+------------------+-----+---------+--------+ PTA     233               1.93 triphasic         +--------+------------------+-----+---------+--------+ DP      188               1.55 triphasic         +--------+------------------+-----+---------+--------+ +---------+------------------+-----+---------+-------+ Left     Lt  Pressure (mmHg)IndexWaveform Comment +---------+------------------+-----+---------+-------+ Brachial 121                    triphasic        +---------+------------------+-----+---------+-------+ PTA      254               2.10 triphasic        +---------+------------------+-----+---------+-------+ DP       162               1.34 triphasic        +---------+------------------+-----+---------+-------+ Great Toe47                0.39                  +---------+------------------+-----+---------+-------+ +-------+-----------+-----------+------------+------------+  ABI/TBIToday's ABIToday's TBIPrevious ABIPrevious TBI +-------+-----------+-----------+------------+------------+ Right  1.93                                           +-------+-----------+-----------+------------+------------+ Left   1.34       0.39                                +-------+-----------+-----------+------------+------------+  Summary: Right: Resting right ankle-brachial index indicates noncompressible right lower extremity arteries. Unable to obtain TBI due to great toe amputation. Left: Resting left ankle-brachial index indicates noncompressible left lower extremity arteries. The left toe-brachial index is abnormal.  *See table(s) above for measurements and observations.     Preliminary     ROS Blood pressure 130/88, pulse 70, temperature 97.7 F (36.5 C), temperature source Oral, resp. rate 18, height 6\' 7"  (2.007 m), weight 85.5 kg, SpO2 99 %.  Vitals:   03/26/20 0156 03/26/20 0605  BP: 130/73 130/88  Pulse: 60 70  Resp: 18 18  Temp: 98.1 F (36.7 C) 97.7 F (36.5 C)  SpO2: 98% 99%   Constitutional Well developed. Well nourished.  Vascular Foot warm and well perfused. Capillary refill normal to all digits.   Neurologic Normal speech. Oriented to person, place, and time. Epicritic sensation to light touch grossly present bilaterally.  Dermatologic  right hallux amputation  site completely reepithelialized.  No concern for ulceration noted at this time.  Right third digit distal tip ulceration probes down to bone with concern for underlying osteomyelitis.  No purulent drainage was expressed.  There is some swelling present circumferential around the digit.  Mild redness around the wound itself.  Left dorsal foot sinus tract down to deep tissue.  Mild purulent drainage was expressed there is some redness on the dorsal aspect of the foot concerning for deep abscess.  No fluctuance palpated.  No malodor present.  Left foot lateral ulceration with fat layer exposed.  Does not probe down to bone.  Probing to deep tissue.  No redness noted.  No other clinical signs of infection noted.  No purulent drainage noted.  Orthopedic: Tenderness to palpation noted about the surgical site.   Assessment/Plan:  Osteomyelitis bilat, concern for abscess left foot -Imaging: Studies independently reviewed -Antibiotics: continue Empirics -WB Status: WBAT bilat -Wound care: defer until after surgery. -Surgical Plan: Left foot incision and drainage with removal of all non-viable soft tissue and bone - areas overlying the 2nd and 5th metatarsals. Right 3rd toe partial amputation, bone biopsy right 1st metatarsal -Will plan for OR tonight.  Thomas [name] 03/26/2020, 8:00 AM   Best available via secure chat for questions or concerns.

## 2020-03-26 NOTE — Progress Notes (Signed)
PROGRESS NOTE    Thomas [name]  TTS:[id number] DOB: [birth year] DOA: 03/25/2020 PCP: [name], MD   Chief Complaint  Patient presents with  . Foot Pain   Brief Narrative: 69 year old male with history of HFrEF, a flutter, hypertension presented with bilateral foot wounds, he had initially developed ulcer on his third digit of his right foot denied any injury.  He was seen in the podiatry office and sent to the ED for MRI and IV antibiotics and possible amputation  Subjective: Seen this morning.  Complains of pain in his bilateral foot.  No nausea vomiting fever chills.   Assessment & Plan:  Osteomyelitis bilaterally, concern for abscess on the left foot.  MRI reviewed see report.  Patient afebrile blood pressure stable leukocytosis noted. On Flagyl and ceftriaxone.Appreciate podiatry input-going for left foot incision and drainage with removal of all nonviable soft tissue and bone with areas overlying the second and fifth metatarsal and the right third toe partial amputation with bone biopsy of right first metatarsal.  A flutter,on Eliquis on hold for surgery on heparin drip.  Timing of heparin holding per podiatry.  Continue amiodarone.  Monitoring telemetry.  HFrEF/NICM last echo EF 15% continue torsemide.  Monitor volume status closely.  Cardiac cath 02/06/2028.  w/ no significant coronary artery disease.  HLD continue Lipitor.  Normocytic anemia monitor hemoglobin.  Polyarthropathy/chronic pain/opiate dependence on Percocet 10:Continue pain control, resume home regimen.  On chronic prednisone 10 mg pre patient, cont same   Nutrition: Diet Order            Diet NPO time specified  Diet effective 1000           Diet Heart Room service appropriate? Yes; Fluid consistency: Thin  Diet effective now                  Body mass index is 21.23 kg/m.  DVT prophylaxis: heparin Code Status:   Code Status: DNR  Family Communication: plan of care discussed with  patient at bedside.  Status is: Inpatient  Remains inpatient appropriate because:IV treatments appropriate due to intensity of illness or inability to take PO, Inpatient level of care appropriate due to severity of illness and For ongoing management of his osteomyelitis needing surgical intervention and IV antibiotics   Dispo: The patient is from: Home              Anticipated d/c is to: Home              Anticipated d/c date is: 3 days              Patient currently is not medically stable to d/c.         Consultants:see note  Procedures:see note  Culture/Microbiology    Component Value Date/Time   SDES  03/25/2020 0955    BLOOD RIGHT ARM Performed at [facility], [place] [address], [place], [place] [address]    SPECREQUEST  03/25/2020 0955    BOTTLES DRAWN AEROBIC AND ANAEROBIC Blood Culture results may not be optimal due to an inadequate volume of blood received in culture bottles Performed at [facility], [place] [address] [place], [place] [address]    CULT  03/25/2020 0955    NO GROWTH < 24 HOURS Performed at [facility] [address], [place], [place] [address]    REPTSTATUS PENDING 03/25/2020 5456    Other culture-see note  Medications: Scheduled Meds: . amiodarone  200 mg  Oral Daily  . atorvastatin  40 mg Oral Daily  . DULoxetine  60 mg Oral Daily  . metroNIDAZOLE  500 mg Oral Q8H  . torsemide  20 mg Oral BID   Continuous Infusions: . cefTRIAXone (ROCEPHIN)  IV Stopped (03/25/20 1100)  . heparin 1,500 Units/hr (03/26/20 5681)    Antimicrobials: Anti-infectives (From admission, onward)   Start     Dose/Rate Route Frequency Ordered Stop   03/25/20 0945  cefTRIAXone (ROCEPHIN) 2 g in sodium chloride 0.9 % 100 mL IVPB       "And" Linked Group Details   2 g 200 mL/hr over 30 Minutes Intravenous Every 24 hours 03/25/20 0941     03/25/20 0945  metroNIDAZOLE (FLAGYL) tablet 500 mg       "And" Linked Group Details    500 mg Oral Every 8 hours 03/25/20 0941       Objective: Vitals: Today's Vitals   03/26/20 0500 03/26/20 0605 03/26/20 0702 03/26/20 0724  BP:  130/88    Pulse:  70    Resp:  18    Temp:  97.7 F (36.5 C)    TempSrc:  Oral    SpO2:  99%    Weight: 85.5 kg     Height:      PainSc:   7  4     Intake/Output Summary (Last 24 hours) at 03/26/2020 0909 Last data filed at 03/26/2020 2751 Gross per 24 hour  Intake 289.93 ml  Output 850 ml  Net -560.07 ml   Filed Weights   03/25/20 0723 03/26/20 0500  Weight: 87.5 kg 85.5 kg   Weight change:   Intake/Output from previous day: 12/02 0701 - 12/03 0700 In: 289.9 [I.V.:189.9; IV Piggyback:100] Out: 850 [Urine:850] Intake/Output this shift: No intake/output data recorded.  Examination: General exam: AAO ,NAD, weak appearing. HEENT:Oral mucosa moist, Ear/Nose WNL grossly,dentition normal. Respiratory system: bilaterally clear,no wheezing or crackles,no use of accessory muscle, non tender. Cardiovascular system: S1 & S2 +, regular, No JVD. Gastrointestinal system: Abdomen soft, NT,ND, BS+. Nervous System:Alert, awake, moving extremities and grossly nonfocal Extremities: No edema, distal peripheral pulses palpable.  See picture below of the wound Skin: No rashes,no icterus. MSK: Normal muscle bulk,tone, power    Data Reviewed: I have personally reviewed following labs and imaging studies CBC: Recent Labs  Lab 03/24/20 1025 03/25/20 0954 03/26/20 0406  WBC 12.6* 13.0* 6.1  NEUTROABS  --  11.7*  --   HGB 11.9* 11.5* 10.7*  HCT 38.3 36.8* 33.6*  MCV 82 82.7 82.8  PLT 323 251 700   Basic Metabolic Panel: Recent Labs  Lab 03/24/20 1025 03/25/20 0954 03/26/20 0406  NA 139 134* 136  K 4.5 4.6 3.9  CL 94* 94* 99  CO2 30* 29 28  GLUCOSE 92 132* 98  BUN 17 18 18   CREATININE 0.92 0.79 0.74  CALCIUM 9.1 8.8* 8.8*   GFR: Estimated Creatinine Clearance: 108.4 mL/min (by C-G formula based on SCr of 0.74 mg/dL).  Liver Function Tests: Recent Labs  Lab 03/24/20 1025 03/25/20 0954 03/26/20 0406  AST 17 18 13*  ALT 18 17 15   ALKPHOS 156* 108 89  BILITOT 0.6 1.0 0.5  PROT 6.8 7.4 6.2*  ALBUMIN 4.1 3.6 3.0*   No results for input(s): LIPASE, AMYLASE in the last 168 hours. No results for input(s): AMMONIA in the last 168 hours. Coagulation Profile: Recent Labs  Lab 03/25/20 1450  INR 1.3*   Cardiac Enzymes: No results for input(s): CKTOTAL, CKMB,  CKMBINDEX, TROPONINI in the last 168 hours. BNP (last 3 results) No results for input(s): PROBNP in the last 8760 hours. HbA1C: No results for input(s): HGBA1C in the last 72 hours. CBG: No results for input(s): GLUCAP in the last 168 hours. Lipid Profile: No results for input(s): CHOL, HDL, LDLCALC, TRIG, CHOLHDL, LDLDIRECT in the last 72 hours. Thyroid Function Tests: No results for input(s): TSH, T4TOTAL, FREET4, T3FREE, THYROIDAB in the last 72 hours. Anemia Panel: No results for input(s): VITAMINB12, FOLATE, FERRITIN, TIBC, IRON, RETICCTPCT in the last 72 hours. Sepsis Labs: Recent Labs  Lab 03/25/20 0954  LATICACIDVEN 1.4    Recent Results (from the past 240 hour(s))  Culture, blood (routine x 2)     Status: None (Preliminary result)   Collection Time: 03/25/20  9:54 AM   Specimen: BLOOD  Result Value Ref Range Status   Specimen Description   Final    BLOOD LEFT ARM Performed at [facility] [address] [place], [place] [address]    Special Requests   Final    BOTTLES DRAWN AEROBIC AND ANAEROBIC Blood Culture adequate volume Performed at [facility] [address], [place], [place] [address]    Culture   Final    NO GROWTH < 24 HOURS Performed at [facility] [address] [place],  [address]    Report Status PENDING  Incomplete  Resp Panel by RT-PCR (Flu A&B, Covid) Nasopharyngeal Swab     Status: None   Collection Time: 03/25/20  9:54 AM   Specimen:  Nasopharyngeal Swab; Nasopharyngeal(NP) swabs in vial transport medium  Result Value Ref Range Status   SARS Coronavirus 2 by RT PCR NEGATIVE NEGATIVE Final    Comment: (NOTE) SARS-CoV-2 target nucleic acids are NOT DETECTED.  The SARS-CoV-2 RNA is generally detectable in upper respiratory specimens during the acute phase of infection. The lowest concentration of SARS-CoV-2 viral copies this assay can detect is 138 copies/mL. A negative result does not preclude SARS-Cov-2 infection and should not be used as the sole basis for treatment or other patient management decisions. A negative result may occur with  improper specimen collection/handling, submission of specimen other than nasopharyngeal swab, presence of viral mutation(s) within the areas targeted by this assay, and inadequate number of viral copies(<138 copies/mL). A negative result must be combined with clinical observations, patient history, and epidemiological information. The expected result is Negative.  Fact Sheet for Patients:  EntrepreneurPulse.com.au  Fact Sheet for Healthcare Providers:  IncredibleEmployment.be  This test is no t yet approved or cleared by the [place] FDA and  has been authorized for detection and/or diagnosis of SARS-CoV-2 by FDA under an Emergency Use Authorization (EUA). This EUA will remain  in effect (meaning this test can be used) for the duration of the COVID-19 declaration under Section 564(b)(1) of the Act, 21 U.S.C.section 360bbb-3(b)(1), unless the authorization is terminated  or revoked sooner.       Influenza A by PCR NEGATIVE NEGATIVE Final   Influenza B by PCR NEGATIVE NEGATIVE Final    Comment: (NOTE) The Xpert Xpress SARS-CoV-2/FLU/RSV plus assay is intended as an aid in the diagnosis of influenza from Nasopharyngeal swab specimens and should not be used as a sole basis for treatment. Nasal washings and aspirates are unacceptable for  Xpert Xpress SARS-CoV-2/FLU/RSV testing.  Fact Sheet for Patients: EntrepreneurPulse.com.au  Fact Sheet for Healthcare Providers: IncredibleEmployment.be  This test is not yet approved or cleared by the [place] FDA  and has been authorized for detection and/or diagnosis of SARS-CoV-2 by FDA under an Emergency Use Authorization (EUA). This EUA will remain in effect (meaning this test can be used) for the duration of the COVID-19 declaration under Section 564(b)(1) of the Act, 21 U.S.C. section 360bbb-3(b)(1), unless the authorization is terminated or revoked.  Performed at [facility], [place] [address] [place], [place] [address]   Culture, blood (routine x 2)     Status: None (Preliminary result)   Collection Time: 03/25/20  9:55 AM   Specimen: BLOOD  Result Value Ref Range Status   Specimen Description   Final    BLOOD RIGHT ARM Performed at [facility] [address], [place], [place] [address]    Special Requests   Final    BOTTLES DRAWN AEROBIC AND ANAEROBIC Blood Culture results may not be optimal due to an inadequate volume of blood received in culture bottles Performed at [facility] [address] [place], [place] [address]    Culture   Final    NO GROWTH < 24 HOURS Performed at [facility] [address] [place], [place] [address]    Report Status PENDING  Incomplete     Radiology Studies: MR FOOT RIGHT W WO CONTRAST  Result Date: 03/25/2020 CLINICAL DATA:  Diabetes, foot swelling. Prior amputation of the great toe. Ulcer along the third toe. EXAM: MRI OF THE RIGHT FOREFOOT WITHOUT AND WITH CONTRAST TECHNIQUE: Multiplanar, multisequence MR imaging of the right forefoot was performed before and after the administration of intravenous contrast. CONTRAST:  67mL GADAVIST GADOBUTROL 1 MMOL/ML IV SOLN COMPARISON:  Multiple exams, including radiographs from  03/03/2020 FINDINGS: Bones/Joint/Cartilage Absent great toe. 1.3 by 1.7 cm erosion along the plantar head of the first metatarsal with a notable surrounding edema in the metatarsal head and distal shaft, suspicious for active osteomyelitis for example on image 26 of series 7. Nonvisualization of the medial first digit sesamoid, probably eroded. There is edema in the lateral sesamoid of the first digit. Prominent edema and enhancement in the distal phalanx of the third toe. Possible midshaft fracture versus bony destruction. Surrounding subcutaneous edema and enhancement in the third toe. Mild deformity of the distal metaphysis of the proximal phalanx third toe likely from an old fracture. Low-grade marrow edema is present in the head of the second metatarsal. Abnormal marrow edema and enhancement is present in the proximal, middle, and distal phalanges of the small toe with associated subcutaneous edema and enhancement. Possible fracture involving the distal head of the proximal phalanx of the small toe. Appearance concerning for osteomyelitis of the small toe. Ligaments Lisfranc ligament intact. Muscles and Tendons Diffuse low-grade edema and enhancement in the visualized musculature of the forefoot, potentially from myositis or neurogenic edema. Thickening of the detached flexor hallucis longus tendon. Soft tissues Extensive edema and enhancement tracking in the dorsum of the foot and in the remaining toes, favoring cellulitis. I not observe a drainable abscess. There is some hypoenhancement in the plantar tissues along the ball of the foot below the second and third MTP joints, without visualized gas in the soft tissues, correlate with visual inspection in assessing for tissue necrosis. IMPRESSION: 1. Osteomyelitis of the head and distal shaft of the first metatarsal. 2. Prominent edema and enhancement in the distal phalanx of the third toe, favoring osteomyelitis, with possible midshaft fracture versus bony  destruction. 3. Abnormal marrow edema and enhancement in the proximal, middle, and distal phalanges of  the small toe, with associated subcutaneous edema and enhancement. Appearance favors osteomyelitis. Possible fracture involving the distal head of the proximal phalanx of the small toe. 4. Extensive edema and enhancement in the dorsum of the foot and in the remaining toes, favoring cellulitis. No drainable abscess. 5. Diffuse low-grade edema and enhancement in the visualized musculature of the forefoot, potentially from myositis or neurogenic edema. 6. Thickening of the detached flexor hallucis longus tendon, compatible with prior amputation. 7. There is some hypoenhancement in the plantar tissues along the ball of the foot below the second and third MTP joints, correlate with visual inspection in assessing for tissue necrosis. 8. Low-grade edema signal and enhancement in the head of the second metatarsal and adjacent base of the proximal phalanx second toe, possibly degenerative or reactive. Electronically Signed   By: Van Clines M.D.   On: 03/25/2020 14:36   MR FOOT LEFT W WO CONTRAST  Result Date: 03/25/2020 CLINICAL DATA:  Left foot pain and swelling.  Diabetic foot ulcers. EXAM: MRI OF THE LEFT FOREFOOT WITHOUT AND WITH CONTRAST TECHNIQUE: Multiplanar, multisequence MR imaging of the left foot was performed both before and after administration of intravenous contrast. CONTRAST:  31mL GADAVIST GADOBUTROL 1 MMOL/ML IV SOLN COMPARISON:  9 cc Gadavist FINDINGS: Diffuse abnormal T1 and T2 signal intensity in the second metatarsal shaft and neck highly suspicious for osteomyelitis with an associated pathologic fracture through the metacarpal neck region. Marked surrounding soft tissue swelling/edema and rim enhancing fluid containing gas, consistent with an abscess. There is also a small open wound along the lateral aspect of the forefoot overlying the region of the fifth metatarsal head. There is  associated abnormal T1 and T2 signal intensity in the fifth metacarpal head and subsequent contrast enhancement consistent with osteomyelitis. I do not see any definite findings for septic arthritis at the fifth MTP joint. Diffuse and fairly marked subcutaneous soft tissue swelling/edema/fluid consistent with cellulitis. No subcutaneous abscesses are identified. There is also diffuse myofasciitis without definite findings for pyomyositis. IMPRESSION: 1. Findings consistent with osteomyelitis involving the second metatarsal shaft and neck with an associated pathologic fracture through the metacarpal neck region. There is a surrounding abscess. 2. Small small open wound along the lateral aspect of the forefoot with underlying osteomyelitis involving the fifth metatarsal head. 3. Diffuse and fairly marked subcutaneous soft tissue swelling/edema/fluid consistent with cellulitis. Electronically Signed   By: Marijo Sanes M.D.   On: 03/25/2020 14:40   VAS [place] ABI WITH/WO TBI  Result Date: 03/25/2020 LOWER EXTREMITY DOPPLER STUDY Indications: Ulceration. High Risk Factors: None.  Limitations: Today's exam was limited due to Right great toe amputation. Comparison Study: No prior studies. Performing Technologist: Carlos Levering RVT  Examination Guidelines: A complete evaluation includes at minimum, Doppler waveform signals and systolic blood pressure reading at the level of bilateral brachial, anterior tibial, and posterior tibial arteries, when vessel segments are accessible. Bilateral testing is considered an integral part of a complete examination. Photoelectric Plethysmograph (PPG) waveforms and toe systolic pressure readings are included as required and additional duplex testing as needed. Limited examinations for reoccurring indications may be performed as noted.  ABI Findings: +--------+------------------+-----+---------+--------+ Right   Rt Pressure (mmHg)IndexWaveform Comment   +--------+------------------+-----+---------+--------+ [id number]                    triphasic         +--------+------------------+-----+---------+--------+ PTA     233  1.93 triphasic         +--------+------------------+-----+---------+--------+ DP      188               1.55 triphasic         +--------+------------------+-----+---------+--------+ +---------+------------------+-----+---------+-------+ Left     Lt Pressure (mmHg)IndexWaveform Comment +---------+------------------+-----+---------+-------+ Brachial 121                    triphasic        +---------+------------------+-----+---------+-------+ PTA      254               2.10 triphasic        +---------+------------------+-----+---------+-------+ DP       162               1.34 triphasic        +---------+------------------+-----+---------+-------+ Great Toe47                0.39                  +---------+------------------+-----+---------+-------+ +-------+-----------+-----------+------------+------------+ ABI/TBIToday's ABIToday's TBIPrevious ABIPrevious TBI +-------+-----------+-----------+------------+------------+ Right  1.93                                           +-------+-----------+-----------+------------+------------+ Left   1.34       0.39                                +-------+-----------+-----------+------------+------------+  Summary: Right: Resting right ankle-brachial index indicates noncompressible right lower extremity arteries. Unable to obtain TBI due to great toe amputation. Left: Resting left ankle-brachial index indicates noncompressible left lower extremity arteries. The left toe-brachial index is abnormal.  *See table(s) above for measurements and observations.     Preliminary      LOS: 1 day   Antonieta Pert, MD [facility]  03/26/2020, 9:09 AM

## 2020-03-26 NOTE — Telephone Encounter (Signed)
Spoke to patient's wife for post-op update.

## 2020-03-26 NOTE — TOC Progression Note (Signed)
Transition of Care [facility]) - Progression Note    Patient Details  Name: Thomas [name] MRN: [id number] Date of Birth: 12-16-1950  Transition of Care [facility]) CM/SW Contact  Purcell Mouton, RN Phone Number: 03/26/2020, 3:20 PM  Clinical Narrative:    Pt from home with spouse. TOC will continue to follow for discharge needs.    Expected Discharge Plan: Home/Self Care Barriers to Discharge: No Barriers Identified  Expected Discharge Plan and Services Expected Discharge Plan: Home/Self Care       Living arrangements for the past 2 months: Single Family Home                                       Social Determinants of Health (SDOH) Interventions    Readmission Risk Interventions No flowsheet data found.

## 2020-03-26 NOTE — Op Note (Signed)
Patient Name: Thomas [name] DOB: 02-Dec-1950  MRN: [id number]   Date of Surgery: 03/26/20  Surgeon: Dr. Hardie Pulley, DPM Assistants: none  Pre-operative Diagnosis:  Osteomyelitis, diabetic ulcer, osteomyelitis Post-operative Diagnosis:  Same Procedures:  1) Right first metatarsal bone biopsy  2) Right third toe amputation PIPJ  3) Left fifth metatarsal bone biopsy  4) Left foot wound excision and closure  5) Left second metatarsal incision and drainage below the deep fascia  6) Left second metatarsectomy Pathology/Specimens: ID Type Source Tests Collected by Time Destination  1 : Left 5th metatarsal for pathology Tissue PATH Other SURGICAL PATHOLOGY Evelina Bucy, DPM 03/26/2020 1822   2 : Right 3rd toe for pathology Tissue PATH Other SURGICAL PATHOLOGY Evelina Bucy, DPM 03/26/2020 1830   3 : Right 1st metatarsal for surgical pathology Tissue PATH Soft tissue SURGICAL PATHOLOGY Evelina Bucy, DPM 03/26/2020 1840   4 : LEFT 2ND METATARSAL Tissue PATH Soft tissue SURGICAL PATHOLOGY Evelina Bucy, DPM 03/26/2020 1900   A : Right 1st metatarsal for micro Tissue PATH Soft tissue AEROBIC/ANAEROBIC CULTURE (SURGICAL/DEEP WOUND) Evelina Bucy, DPM 03/26/2020 1825   B : Right 3rd toe swab culture Tissue PATH Other AEROBIC/ANAEROBIC CULTURE (SURGICAL/DEEP WOUND) Evelina Bucy, DPM 03/26/2020 1829   C : Left 5th metatarsal for micro Tissue PATH Other AEROBIC/ANAEROBIC CULTURE (SURGICAL/DEEP WOUND) Evelina Bucy, DPM 03/26/2020 1842   D : LEFT FOOT SWAB CULTURE Wound Wound AEROBIC/ANAEROBIC CULTURE (SURGICAL/DEEP WOUND) Evelina Bucy, DPM 03/26/2020 1855   E : LEFT 2ND METATARSAL Tissue Wound AEROBIC/ANAEROBIC CULTURE (SURGICAL/DEEP WOUND) Evelina Bucy, DPM 03/26/2020 1905    Anesthesia: MAC local with 20 cc Marcaine half percent Hemostasis: * No tourniquets in log * Estimated Blood Loss: 20 ml Materials: * No implants in log * Medications: 1 g vancomycin  powder Complications: None  Indications for Procedure:  This is a 69 y.o. male with AN infection and abscess to the left foot as well as necrotic right third toe with additional concern for osteomyelitis of the right first metatarsal and left fifth metatarsal. It was discussed for these issues he would benefit from surgical intervention. All risk benefits alternatives of surgery discussed with the patient no guarantees were given.   Procedure in Detail: Patient was identified in pre-operative holding area. Patient was brought back to the operating room. Anesthesia was induced. The extremity was prepped and draped in the usual sterile fashion. Timeout was performed for each foot.  Attention was then directed to the right foot first. The left foot remained sterilely prepped but not exposed. Attention was first directed to the first metatarsal. There was concern for osteomyelitis at this area. An incision was made on the dorsal aspect of the foot and spread with a hemostat. A Jamshidi needle was used to obtain a plug of the most distal and plantar aspect of the first metatarsal. This bone specimen was split for microbiology and pathology. The wound was then copiously irrigated and closed with 4-0 nylon.  Attention was then directed to the right third toe. The toe was grossly necrotic with purulence emanating at the distal aspect the digit. This was collected for swab culture. An incision was then made distal to the proximal interphalangeal joint and dissection was carried down through the skin and subcu tissue. Dissection was continued to the proximal phalangeal joint. The toe was disarticulated at the proximal phalangeal joint. The distal edge of the digit was sent for pathology. The wound was then copiously irrigated.  Vancomycin powder was packed into the wound. Dogears were excised and the skin edges were closed with 4-0 nylon.  The foot was then dressed with Xeroform 4 x 4's Kerlix and Ace  bandage.  At this point new gloves were donned new instrumentation was used to avoid cross contamination. The left foot was then exposed. There was a wound about the lateral aspect of the foot measuring 0.5 x 0.5 with significant hyperkeratosis but did not show signs of frank infection. The wound went down to the bone. The wound was sharply excisionally debrided with a 15 blade. There is a central fibrotic plug of the wound which was again sharply excisionally debrided with a 15 blade.   A separate incision was then made about the dorsal aspect of the fifth metatarsal. A hemostat was used for blunt dissection down to the fifth metatarsal bone. A new Jamshidi needle was again used to biopsy the bone and this was sent for both microbiology and pathology. The incision and wound were copiously irrigated. Following debridement the wound measured 1 x 0.5. Each wound was primarily closed with 4-0 nylon. Additional skin staple was placed at the lateral excised wound.  Attention was finally directed to the area overlying the second metatarsal. There is fluctuance and purulence noted at this area. This was collected for swab culture. Incision was made overlying this area with return of purulent material. The areas were bluntly dissected and purulence was maximally expressed. The wound was then copiously irrigated with 1 L of normal saline via pulse lavage. There did appear to be minor necrosis of the tissues that did appear down to the remaining metatarsal. The tissues at this area were sharply excisionally debrided to expose the second metatarsal. The second metatarsal was palpated and noted to be softer than expected for healthy bone. It was easily incised with a 15 blade. The metatarsal head was then resected with a sagittal saw and split and sent for microbiology and pathology. The wound was then irrigated with another 1 L of normal saline. The wound was then closed with 4-0 nylon and skin staples. The wound was  packed with iodoform packing material that was soaked in vancomycin.  The foot was then dressed with Xeroform 4 x 4 Kerlix and Ace bandage. Patient tolerated the procedure well.  Disposition: Following a period of post-operative monitoring, patient will be transferred back to the floor. We will monitor the cultures for possible need for antibiotic therapy on an extended basis. He will receive regular wound care on the floor including packing changes to the left foot. Once wounds improve and antibiotic plan in place he will be ready for discharge.

## 2020-03-26 NOTE — Transfer of Care (Signed)
Immediate Anesthesia Transfer of Care Note  Patient: Thomas [name]  Procedure(s) Performed: Left foot incision and drainage with removal of all non-viable soft tissue and bone - areas overlying the 2nd and 5th metatarsals. (Left ) Right 3rd toe partial amputation, bone biopsy right 1st metatarsal (Right Toe)  Patient Location: PACU  Anesthesia Type:MAC  Level of Consciousness: awake, alert , oriented and patient cooperative  Airway & Oxygen Therapy: Patient Spontanous Breathing and Patient connected to face mask  Post-op Assessment: Report given to RN and Post -op Vital signs reviewed and stable  Post vital signs: Reviewed and stable  Last Vitals:  Vitals Value Taken Time  BP 134/82 03/26/20 1921  Temp    Pulse 63 03/26/20 1923  Resp 11 03/26/20 1923  SpO2 100 % 03/26/20 1923  Vitals shown include unvalidated device data.  Last Pain:  Vitals:   03/26/20 1640  TempSrc: Oral  PainSc: 6       Patients Stated Pain Goal: 3 (81/10/31 5945)  Complications: No complications documented.

## 2020-03-26 NOTE — Plan of Care (Signed)

## 2020-03-26 NOTE — Anesthesia Postprocedure Evaluation (Signed)
Anesthesia Post Note  Patient: Thomas [name]  Procedure(s) Performed: Left foot incision and drainage with removal of all non-viable soft tissue and bone - areas overlying the 2nd and 5th metatarsals. (Left ) Right 3rd toe partial amputation, bone biopsy right 1st metatarsal (Right Toe)     Patient location during evaluation: PACU Anesthesia Type: MAC Level of consciousness: awake and alert Pain management: pain level controlled Vital Signs Assessment: post-procedure vital signs reviewed and stable Respiratory status: spontaneous breathing, nonlabored ventilation, respiratory function stable and patient connected to nasal cannula oxygen Cardiovascular status: stable and blood pressure returned to baseline Postop Assessment: no apparent nausea or vomiting Anesthetic complications: no   No complications documented.  Last Vitals:  Vitals:   03/26/20 1945 03/26/20 1955  BP: (!) 143/78   Pulse: (!) 59 (!) 54  Resp: 12 12  Temp:    SpO2: 96% 100%    Last Pain:  Vitals:   03/26/20 1955  TempSrc:   PainSc: Asleep                 Makaylah Oddo COKER

## 2020-03-26 NOTE — Anesthesia Preprocedure Evaluation (Addendum)
Anesthesia Evaluation  Patient identified by MRN, date of birth, ID band Patient awake    Reviewed: Allergy & Precautions, NPO status , Patient's Chart, lab work & pertinent test results  Airway Mallampati: II  TM Distance: >3 FB Neck ROM: Full    Dental  (+) Teeth Intact   Pulmonary    breath sounds clear to auscultation       Cardiovascular hypertension,  Rhythm:Regular Rate:Normal     Neuro/Psych    GI/Hepatic   Endo/Other    Renal/GU      Musculoskeletal   Abdominal   Peds  Hematology   Anesthesia Other Findings   Reproductive/Obstetrics                             Anesthesia Physical Anesthesia Plan  ASA: III  Anesthesia Plan: MAC   Post-op Pain Management:    Induction: Intravenous  PONV Risk Score and Plan: Ondansetron  Airway Management Planned: Simple Face Mask and Natural Airway  Additional Equipment:   Intra-op Plan:   Post-operative Plan:   Informed Consent: I have reviewed the patients History and Physical, chart, labs and discussed the procedure including the risks, benefits and alternatives for the proposed anesthesia with the patient or authorized representative who has indicated his/her understanding and acceptance.       Plan Discussed with: CRNA and Anesthesiologist  Anesthesia Plan Comments:         Anesthesia Quick Evaluation

## 2020-03-26 NOTE — Progress Notes (Signed)
ANTICOAGULATION CONSULT NOTE -   Pharmacy Consult for Heparin while apixaban on hold Indication: atrial fibrillation  Allergies  Allergen Reactions  . Chlorthalidone Other (See Comments)    "Makes me light-headed and I don't like the way it makes me feel"  . Voltaren [Diclofenac] Rash    Patient Measurements: Height: 6\' 7"  (200.7 cm) Weight: 87.5 kg (193 lb) IBW/kg (Calculated) : 93.7 Heparin Dosing Weight: actual weight  Vital Signs: Temp: 98.1 F (36.7 C) (12/03 0156) Temp Source: Oral (12/03 0156) BP: 130/73 (12/03 0156) Pulse Rate: 60 (12/03 0156)  Labs: Recent Labs    03/24/20 1025 03/25/20 0954 03/25/20 1450 03/25/20 2246 03/26/20 0406  HGB 11.9* 11.5*  --   --  10.7*  HCT 38.3 36.8*  --   --  33.6*  PLT 323 251  --   --  223  APTT  --   --  53* 68* 66*  LABPROT  --   --  15.2  --   --   INR  --   --  1.3*  --   --   HEPARINUNFRC  --   --  1.84*  --   --   CREATININE 0.92 0.79  --   --  0.74    Estimated Creatinine Clearance: 110.9 mL/min (by C-G formula based on SCr of 0.74 mg/dL).   Medical History: Past Medical History:  Diagnosis Date  . Atrial flutter ([place]) 01/2020  . Cancer [facility])    prostate  . Claustrophobia    quite severe  . Heart failure with reduced ejection fraction ([place])   . Hypertension   . Hypoglycemia    occ  . Left knee DJD    Xray 12/23/08  . NICM (nonischemic cardiomyopathy) ([place]) 02/12/2020  . Prostate cancer ([place])     Medications:  Scheduled:  . amiodarone  200 mg Oral Daily  . atorvastatin  40 mg Oral Daily  . DULoxetine  60 mg Oral Daily  . metroNIDAZOLE  500 mg Oral Q8H  . torsemide  20 mg Oral BID   Infusions:  . cefTRIAXone (ROCEPHIN)  IV Stopped (03/25/20 1100)  . heparin 1,400 Units/hr (03/25/20 1630)   PRN: acetaminophen **OR** acetaminophen, morphine injection, oxyCODONE  Assessment: 69 yo male with history of afib on eliquis presents with bilateral foot wounds. Due to concern for osteomyelitis and  possible need for surgery, pharmacy is consulted to transition to IV heparin.  Last dose of apixaban was 12/2 at 05:30  03/26/2020 APTT 66 low end of  Therapeutic Hgb 10.7, plts WNL No bleeding noted  Goal of Therapy:  Heparin level 0.3-0.7 units/ml  PTT 66-102 seconds Monitor platelets by anticoagulation protocol: Yes   Plan:  -Will increase heparin to 1500 units/hr to keep it from slipping into the subtherapeutic range - monitor with PTTs since heparin levels will be falsely elevated due to recent apixaban use - Daily heparin level and PTT til they correlate, then monitor using heparin levels only - aPTT and heparin level in am - daily CBC  Dolly Rias RPh 03/26/2020, 5:58 AM

## 2020-03-27 ENCOUNTER — Encounter (HOSPITAL_COMMUNITY): Payer: Self-pay | Admitting: Podiatry

## 2020-03-27 DIAGNOSIS — M86171 Other acute osteomyelitis, right ankle and foot: Secondary | ICD-10-CM

## 2020-03-27 DIAGNOSIS — M86179 Other acute osteomyelitis, unspecified ankle and foot: Secondary | ICD-10-CM | POA: Diagnosis not present

## 2020-03-27 DIAGNOSIS — L089 Local infection of the skin and subcutaneous tissue, unspecified: Secondary | ICD-10-CM | POA: Diagnosis not present

## 2020-03-27 LAB — CBC
HCT: 37.1 % — ABNORMAL LOW (ref 39.0–52.0)
Hemoglobin: 11.6 g/dL — ABNORMAL LOW (ref 13.0–17.0)
MCH: 26 pg (ref 26.0–34.0)
MCHC: 31.3 g/dL (ref 30.0–36.0)
MCV: 83 fL (ref 80.0–100.0)
Platelets: 270 10*3/uL (ref 150–400)
RBC: 4.47 MIL/uL (ref 4.22–5.81)
RDW: 18.9 % — ABNORMAL HIGH (ref 11.5–15.5)
WBC: 6.7 10*3/uL (ref 4.0–10.5)
nRBC: 0 % (ref 0.0–0.2)

## 2020-03-27 LAB — APTT: aPTT: 79 seconds — ABNORMAL HIGH (ref 24–36)

## 2020-03-27 LAB — HEPARIN LEVEL (UNFRACTIONATED): Heparin Unfractionated: 0.49 IU/mL (ref 0.30–0.70)

## 2020-03-27 MED ORDER — VANCOMYCIN HCL IN DEXTROSE 1-5 GM/200ML-% IV SOLN
1000.0000 mg | Freq: Three times a day (TID) | INTRAVENOUS | Status: DC
  Administered 2020-03-27 – 2020-03-29 (×5): 1000 mg via INTRAVENOUS
  Filled 2020-03-27 (×5): qty 200

## 2020-03-27 MED ORDER — APIXABAN 5 MG PO TABS
5.0000 mg | ORAL_TABLET | Freq: Two times a day (BID) | ORAL | Status: DC
  Administered 2020-03-27 – 2020-03-29 (×5): 5 mg via ORAL
  Filled 2020-03-27 (×5): qty 1

## 2020-03-27 MED ORDER — VANCOMYCIN HCL 1750 MG/350ML IV SOLN
1750.0000 mg | INTRAVENOUS | Status: AC
  Administered 2020-03-27: 1750 mg via INTRAVENOUS
  Filled 2020-03-27: qty 350

## 2020-03-27 MED ORDER — HEPARIN (PORCINE) 25000 UT/250ML-% IV SOLN: 1500.0000 [IU]/h | INTRAVENOUS | Status: DC

## 2020-03-27 MED ORDER — SODIUM CHLORIDE 0.9 % IV SOLN
INTRAVENOUS | Status: DC | PRN
  Administered 2020-03-27 (×2): 250 mL via INTRAVENOUS

## 2020-03-27 NOTE — Progress Notes (Signed)
Subjective:  Patient ID: Thomas [name], male    DOB: 1951/02/27,  MRN: [id number]  Patient seen bedside. Pain to both feet but controlled with medication, rated at 4/10. Objective:   Vitals:   03/27/20 0537 03/27/20 1232  BP: 128/75 126/74  Pulse: (!) 55 (!) 59  Resp: 20 16  Temp: 97.7 F (36.5 C) 98 F (36.7 C)  SpO2: 98% 100%   General AA&O x3. Normal mood and affect.  Vascular Dorsalis pedis and posterior tibial pulses 2/4 bilat. Brisk capillary refill to all digits. Pedal hair present.  Neurologic Epicritic sensation grossly intact.  Dermatologic Right foot: 3rd toe amputation site healing well with mild sanguinous drainage no warmth erythema signs of infection. 1st met amputation site healing well without w/e/SOI  Left foot: 2nd metatarsal site decreased erythema, no further purulence. No crepitus. 5th metatarsal site healing well without W/E/SOI  Orthopedic: MMT 5/5 in dorsiflexion, plantarflexion, inversion, and eversion. Normal joint ROM without pain or crepitus.   Results for orders placed or performed during the hospital encounter of 03/25/20  Culture, blood (routine x 2)     Status: None (Preliminary result)   Collection Time: 03/25/20  9:54 AM   Specimen: BLOOD  Result Value Ref Range Status   Specimen Description   Final    BLOOD LEFT ARM Performed at [facility] [address] [place], [place] [address]    Special Requests   Final    BOTTLES DRAWN AEROBIC AND ANAEROBIC Blood Culture adequate volume Performed at [facility] [address] [place], [place] [address]    Culture   Final    NO GROWTH 2 DAYS Performed at [facility] [address] [place], [place] [address]    Report Status PENDING  Incomplete  Resp Panel by RT-PCR (Flu A&B, Covid) Nasopharyngeal Swab     Status: None   Collection Time: 03/25/20  9:54 AM   Specimen: Nasopharyngeal Swab; Nasopharyngeal(NP) swabs in vial transport medium   Result Value Ref Range Status   SARS Coronavirus 2 by RT PCR NEGATIVE NEGATIVE Final    Comment: (NOTE) SARS-CoV-2 target nucleic acids are NOT DETECTED.  The SARS-CoV-2 RNA is generally detectable in upper respiratory specimens during the acute phase of infection. The lowest concentration of SARS-CoV-2 viral copies this assay can detect is 138 copies/mL. A negative result does not preclude SARS-Cov-2 infection and should not be used as the sole basis for treatment or other patient management decisions. A negative result may occur with  improper specimen collection/handling, submission of specimen other than nasopharyngeal swab, presence of viral mutation(s) within the areas targeted by this assay, and inadequate number of viral copies(<138 copies/mL). A negative result must be combined with clinical observations, patient history, and epidemiological information. The expected result is Negative.  Fact Sheet for Patients:  EntrepreneurPulse.com.au  Fact Sheet for Healthcare Providers:  IncredibleEmployment.be  This test is no t yet approved or cleared by the [place] FDA and  has been authorized for detection and/or diagnosis of SARS-CoV-2 by FDA under an Emergency Use Authorization (EUA). This EUA will remain  in effect (meaning this test can be used) for the duration of the COVID-19 declaration under Section 564(b)(1) of the Act, 21 U.S.C.section 360bbb-3(b)(1), unless the authorization is terminated  or revoked sooner.       Influenza A by PCR NEGATIVE NEGATIVE Final   Influenza B by PCR NEGATIVE NEGATIVE Final    Comment: (NOTE) The Xpert Xpress SARS-CoV-2/FLU/RSV  plus assay is intended as an aid in the diagnosis of influenza from Nasopharyngeal swab specimens and should not be used as a sole basis for treatment. Nasal washings and aspirates are unacceptable for Xpert Xpress SARS-CoV-2/FLU/RSV testing.  Fact Sheet for Patients:  EntrepreneurPulse.com.au  Fact Sheet for Healthcare Providers: IncredibleEmployment.be  This test is not yet approved or cleared by the [place] FDA and has been authorized for detection and/or diagnosis of SARS-CoV-2 by FDA under an Emergency Use Authorization (EUA). This EUA will remain in effect (meaning this test can be used) for the duration of the COVID-19 declaration under Section 564(b)(1) of the Act, 21 U.S.C. section 360bbb-3(b)(1), unless the authorization is terminated or revoked.  Performed at [facility], [place] [address], [place], [place] [address]   Culture, blood (routine x 2)     Status: None (Preliminary result)   Collection Time: 03/25/20  9:55 AM   Specimen: BLOOD  Result Value Ref Range Status   Specimen Description   Final    BLOOD RIGHT ARM Performed at [facility] [address] [place], [place] [address]    Special Requests   Final    BOTTLES DRAWN AEROBIC AND ANAEROBIC Blood Culture results may not be optimal due to an inadequate volume of blood received in culture bottles Performed at [facility] [address], [place], [place] [address]    Culture   Final    NO GROWTH 2 DAYS Performed at [facility] [address], [place], [place] [address]    Report Status PENDING  Incomplete  MRSA PCR Screening     Status: None   Collection Time: 03/26/20  1:50 PM   Specimen: Nasopharyngeal  Result Value Ref Range Status   MRSA by PCR NEGATIVE NEGATIVE Final    Comment:        The GeneXpert MRSA Assay (FDA approved for NASAL specimens only), is one component of a comprehensive MRSA colonization surveillance program. It is not intended to diagnose MRSA infection nor to guide or monitor treatment for MRSA infections. Performed at [facility], [place] [address] [place], [place] [address]   Aerobic/Anaerobic Culture  (surgical/deep wound)     Status: None (Preliminary result)   Collection Time: 03/26/20  6:25 PM   Specimen: PATH Soft tissue  Result Value Ref Range Status   Specimen Description   Final    TISSUE RIGHT FIRST METATARSAL Performed at [facility] [address] [place], [place] [address]    Special Requests   Final    NONE Performed at [facility], [place] [address], [place], [place] [address]    Gram Stain NO WBC SEEN NO ORGANISMS SEEN   Final   Culture   Final    NO GROWTH < 12 HOURS Performed at [facility], [place] [address], [place], [place] [address]    Report Status PENDING  Incomplete  Aerobic/Anaerobic Culture (surgical/deep wound)     Status: None (Preliminary result)   Collection Time: 03/26/20  6:29 PM   Specimen: PATH Other; Tissue  Result Value Ref Range Status   Specimen Description   Final    TOE RIGHT THIRD TOE Performed at [facility] [address], [place], [place] [address]    Special Requests   Final    NONE Performed at [facility], [place] [address] [place], [place] [address]    Gram Stain  Final    FEW WBC PRESENT, PREDOMINANTLY PMN FEW GRAM POSITIVE COCCI IN CLUSTERS    Culture   Final    TOO YOUNG TO READ Performed at [facility], [place] [address], [place], [place] [address]    Report Status PENDING  Incomplete  Aerobic/Anaerobic Culture (surgical/deep wound)     Status: None (Preliminary result)   Collection Time: 03/26/20  6:42 PM   Specimen: PATH Other; Tissue  Result Value Ref Range Status   Specimen Description   Final    TISSUE LEFT FIFTH METATARSAL Performed at [facility], [place] [address] [place], [place] [address]    Special Requests   Final    NONE Performed at [facility], [place] [address] [place], [place] [address]    Gram Stain NO WBC SEEN NO ORGANISMS SEEN   Final   Culture   Final    NO GROWTH <  12 HOURS Performed at [facility], [place] [address] [place], [place] [address]    Report Status PENDING  Incomplete  Aerobic/Anaerobic Culture (surgical/deep wound)     Status: None (Preliminary result)   Collection Time: 03/26/20  6:55 PM   Specimen: Wound  Result Value Ref Range Status   Specimen Description   Final    WOUND FOOT LEFT Performed at [facility] [place] [facility] [address], [place], [place] [address]    Special Requests   Final    NONE Performed at [facility], [place] [address] [place], [place] [address]    Gram Stain   Final    MODERATE WBC PRESENT, PREDOMINANTLY PMN FEW GRAM POSITIVE COCCI IN CLUSTERS    Culture   Final    TOO YOUNG TO READ Performed at [facility], [place] [address], [place], [place] [address]    Report Status PENDING  Incomplete  Aerobic/Anaerobic Culture (surgical/deep wound)     Status: None (Preliminary result)   Collection Time: 03/26/20  7:05 PM   Specimen: Wound; Tissue  Result Value Ref Range Status   Specimen Description   Final    WOUND LEFT 2ND METATARSAL Performed at [facility] [address] [place], [place] [address]    Special Requests   Final    NONE Performed at [facility], [place] [address], [place], [place] [address]    Gram Stain NO WBC SEEN NO ORGANISMS SEEN   Final   Culture   Final    NO GROWTH < 12 HOURS Performed at [facility], [place] [address], [place],  [address]    Report Status PENDING  Incomplete    Assessment & Plan:  Patient was evaluated and treated and all questions answered.  Osteomyelitis, Abscess -Dressings changed today. Wounds healing well. No continued purulence left foot. -Wound cultures and path pending. -WBAT bilat in surgical shoes -Continue empiric abx until cultures return. -Wound Care:  Right foot: Non-stick dressing (Adaptic/xeroform), 4x4, kerlix, ACE bandage. Change qOD.  Left foot:  flush wound 2nd metatarsal area with saline flush. Pack gently with iodoform packing daily. Cover with non-stick dressing, 4x4, kerlix, ACE.  Evelina Bucy, DPM  Accessible via secure chat for questions or concerns.

## 2020-03-27 NOTE — Progress Notes (Signed)
PROGRESS NOTE    Thomas [name]  CBJ:[id number] DOB: Oct 17, 1950 DOA: 03/25/2020 PCP: [name], MD   Chief Complaint  Patient presents with  . Foot Pain   Brief Narrative: 69 year old male with history of HFrEF, a flutter, hypertension presented with bilateral foot wounds, he had initially developed ulcer on his third digit of his right foot denied any injury.  He was seen in the podiatry office and sent to the ED for MRI and IV antibiotics and possible amputation  Subjective: Resting comfortably.  No nausea or vomiting.  Reports he has been walked yet.  Dressing intact postop. Pain more or less stable Afebrile overnight. Underwent multiple procedure for his bilateral foot by Dr. March Rummage 12/3   Assessment & Plan:  Osteomyelitis bilaterally, concern for abscess on the left foot.  MRI reviewed see report.  Seen by Dr. March Rummage and underwent multiple procedures on his bilateral foot and toes, see procedure note as below on 12/3, follow-up Intra-Op wound culture, so far Gram stain unremarkable except for left foot wound which shows gram-positive cocci in clusters.  Continue ceftriaxone/Flagyl.  Will consider discussion w/ infectious disease, follow-up on culture report  A flutter,on Eliquis held for surgery and on heparin drip.  Discussed with Dr. March Rummage okay to switch to Eliquis today.Continue midodrine.    HFrEF/NICM last echo EF 15% continue torsemide.  Monitor volume status closely.  Cardiac cath 02/06/2028 w/ no significant coronary artery disease.  HLD continue statin.  Normocytic anemia monitor hb  Polyarthropathy/chronic pain/opiate dependence on Percocet 10:Continue pain control, continue his chronic prednisone 10 mg.  Continue supportive care PT OT evaluation.  Discussed with podiatry Dr. March Rummage.  Nutrition: Diet Order            Diet Heart Room service appropriate? Yes; Fluid consistency: Thin  Diet effective now                 Body mass index is 21.19 kg/m.   DVT prophylaxis: heparin Code Status:   Code Status: DNR  Family Communication: plan of care discussed with patient at bedside.  Status is: Inpatient  Remains inpatient appropriate because:IV treatments appropriate due to intensity of illness or inability to take PO, Inpatient level of care appropriate due to severity of illness and For ongoing management of his osteomyelitis needing surgical intervention and IV antibiotics  Dispo:The patient is from: Home            Anticipated d/c is to: Home            Anticipated d/c date is: 2 days            Patient currently is not medically stable to d/c.   Consultants:see note  Procedures: 1) Right first metatarsal bone biopsy 2) Right third toe amputation PIPJ 3) Left fifth metatarsal bone biopsy 4) Left foot wound excision and closure 5) Left second metatarsal incision and drainage below the deep fascia 6) Left second metatarsectomy  Culture/Microbiology    Component Value Date/Time   SDES  03/26/2020 1905    WOUND LEFT 2ND METATARSAL Performed at [facility] [address], [place], [place] [address]    SPECREQUEST  03/26/2020 1905    NONE Performed at [facility], [place] [address], [place], [place] [address]    CULT  03/26/2020 1905    NO GROWTH < 12 HOURS Performed at [facility] [address] [place], [place] [address]    REPTSTATUS PENDING 03/26/2020 1905  Other culture-see note  Medications: Scheduled Meds: . amiodarone  200 mg Oral Daily  . atorvastatin  40 mg Oral Daily  . DULoxetine  60 mg Oral Daily  . metroNIDAZOLE  500 mg Oral Q8H  . oxyCODONE-acetaminophen  1 tablet Oral Q4H   And  . oxyCODONE  5 mg Oral Q4H  . predniSONE  10 mg Oral Q breakfast  . torsemide  20 mg Oral BID   Continuous Infusions: . sodium chloride 250 mL (03/27/20 1025)  . cefTRIAXone (ROCEPHIN)  IV Stopped (03/27/20 1057)  . heparin 1,500 Units/hr (03/27/20 1242)     Antimicrobials: Anti-infectives (From admission, onward)   Start     Dose/Rate Route Frequency Ordered Stop   03/26/20 1829  vancomycin (VANCOCIN) powder  Status:  Discontinued          As needed 03/26/20 1834 03/26/20 1953   03/25/20 0945  cefTRIAXone (ROCEPHIN) 2 g in sodium chloride 0.9 % 100 mL IVPB       "And" Linked Group Details   2 g 200 mL/hr over 30 Minutes Intravenous Every 24 hours 03/25/20 0941     03/25/20 0945  metroNIDAZOLE (FLAGYL) tablet 500 mg       "And" Linked Group Details   500 mg Oral Every 8 hours 03/25/20 0941       Objective: Vitals: Today's Vitals   03/27/20 0816 03/27/20 0915 03/27/20 1232 03/27/20 1238  BP:   126/74   Pulse:   (!) 59   Resp:   16   Temp:   98 F (36.7 C)   TempSrc:   Oral   SpO2:   100%   Weight:      Height:      PainSc: 7  4   7      Intake/Output Summary (Last 24 hours) at 03/27/2020 1316 Last data filed at 03/27/2020 1057 Gross per 24 hour  Intake 889.28 ml  Output 2300 ml  Net -1410.72 ml   Filed Weights   03/25/20 0723 03/26/20 0500 03/27/20 0537  Weight: 87.5 kg 85.5 kg 85.3 kg   Weight change: -2.223 kg  Intake/Output from previous day: 12/03 0701 - 12/04 0700 In: 729.3 [I.V.:629.3; IV Piggyback:100] Out: 2175 [Urine:2175] Intake/Output this shift: Total I/O In: 160 [I.V.:60; IV Piggyback:100] Out: 750 [Urine:750]  Examination: General exam: AAOx3 , NAD, weak appearing. HEENT:Oral mucosa moist, Ear/Nose WNL grossly, dentition normal. Respiratory system: bilaterally lear,no wheezing or crackles,no use of accessory muscle Cardiovascular system: S1 & S2 +, No JVD,. Gastrointestinal system: Abdomen soft, NT,ND, BS+ Nervous System:Alert, awake, moving extremities and grossly nonfocal Extremities: Lower extremities with postop dressing intact -DID NOT REMOVE- Dr. March Rummage to change today Skin: No rashes,no icterus. MSK: Normal muscle bulk,tone, power  Preop Picture:    Data Reviewed: I have personally  reviewed following labs and imaging studies CBC: Recent Labs  Lab 03/24/20 1025 03/25/20 0954 03/26/20 0406 03/27/20 0546  WBC 12.6* 13.0* 6.1 6.7  NEUTROABS  --  11.7*  --   --   HGB 11.9* 11.5* 10.7* 11.6*  HCT 38.3 36.8* 33.6* 37.1*  MCV 82 82.7 82.8 83.0  PLT 323 251 223 676   Basic Metabolic Panel: Recent Labs  Lab 03/24/20 1025 03/25/20 0954 03/26/20 0406  NA 139 134* 136  K 4.5 4.6 3.9  CL 94* 94* 99  CO2 30* 29 28  GLUCOSE 92 132* 98  BUN 17 18 18   CREATININE 0.92 0.79 0.74  CALCIUM 9.1 8.8* 8.8*   GFR: Estimated  Creatinine Clearance: 108.1 mL/min (by C-G formula based on SCr of 0.74 mg/dL). Liver Function Tests: Recent Labs  Lab 03/24/20 1025 03/25/20 0954 03/26/20 0406  AST 17 18 13*  ALT 18 17 15   ALKPHOS 156* 108 89  BILITOT 0.6 1.0 0.5  PROT 6.8 7.4 6.2*  ALBUMIN 4.1 3.6 3.0*   No results for input(s): LIPASE, AMYLASE in the last 168 hours. No results for input(s): AMMONIA in the last 168 hours. Coagulation Profile: Recent Labs  Lab 03/25/20 1450  INR 1.3*   Cardiac Enzymes: No results for input(s): CKTOTAL, CKMB, CKMBINDEX, TROPONINI in the last 168 hours. BNP (last 3 results) No results for input(s): PROBNP in the last 8760 hours. HbA1C: No results for input(s): HGBA1C in the last 72 hours. CBG: No results for input(s): GLUCAP in the last 168 hours. Lipid Profile: No results for input(s): CHOL, HDL, LDLCALC, TRIG, CHOLHDL, LDLDIRECT in the last 72 hours. Thyroid Function Tests: No results for input(s): TSH, T4TOTAL, FREET4, T3FREE, THYROIDAB in the last 72 hours. Anemia Panel: No results for input(s): VITAMINB12, FOLATE, FERRITIN, TIBC, IRON, RETICCTPCT in the last 72 hours. Sepsis Labs: Recent Labs  Lab 03/25/20 0954  LATICACIDVEN 1.4    Recent Results (from the past 240 hour(s))  Culture, blood (routine x 2)     Status: None (Preliminary result)   Collection Time: 03/25/20  9:54 AM   Specimen: BLOOD  Result Value Ref Range  Status   Specimen Description   Final    BLOOD LEFT ARM Performed at [facility] [address], [place], [place] [address]    Special Requests   Final    BOTTLES DRAWN AEROBIC AND ANAEROBIC Blood Culture adequate volume Performed at [facility] [address], [place], [place] [address]    Culture   Final    NO GROWTH 2 DAYS Performed at [facility] [address] [place], [place] [address]    Report Status PENDING  Incomplete  Resp Panel by RT-PCR (Flu A&B, Covid) Nasopharyngeal Swab     Status: None   Collection Time: 03/25/20  9:54 AM   Specimen: Nasopharyngeal Swab; Nasopharyngeal(NP) swabs in vial transport medium  Result Value Ref Range Status   SARS Coronavirus 2 by RT PCR NEGATIVE NEGATIVE Final    Comment: (NOTE) SARS-CoV-2 target nucleic acids are NOT DETECTED.  The SARS-CoV-2 RNA is generally detectable in upper respiratory specimens during the acute phase of infection. The lowest concentration of SARS-CoV-2 viral copies this assay can detect is 138 copies/mL. A negative result does not preclude SARS-Cov-2 infection and should not be used as the sole basis for treatment or other patient management decisions. A negative result may occur with  improper specimen collection/handling, submission of specimen other than nasopharyngeal swab, presence of viral mutation(s) within the areas targeted by this assay, and inadequate number of viral copies(<138 copies/mL). A negative result must be combined with clinical observations, patient history, and epidemiological information. The expected result is Negative.  Fact Sheet for Patients:  EntrepreneurPulse.com.au  Fact Sheet for Healthcare Providers:  IncredibleEmployment.be  This test is no t yet approved or cleared by the [place] FDA and  has been authorized for detection and/or diagnosis of SARS-CoV-2 by FDA under an Emergency Use  Authorization (EUA). This EUA will remain  in effect (meaning this test can be used) for the duration of the COVID-19 declaration under Section 564(b)(1) of the Act, 21 U.S.C.section 360bbb-3(b)(1), unless the authorization is terminated  or revoked sooner.       Influenza A by PCR NEGATIVE NEGATIVE Final   Influenza B by PCR NEGATIVE NEGATIVE Final    Comment: (NOTE) The Xpert Xpress SARS-CoV-2/FLU/RSV plus assay is intended as an aid in the diagnosis of influenza from Nasopharyngeal swab specimens and should not be used as a sole basis for treatment. Nasal washings and aspirates are unacceptable for Xpert Xpress SARS-CoV-2/FLU/RSV testing.  Fact Sheet for Patients: EntrepreneurPulse.com.au  Fact Sheet for Healthcare Providers: IncredibleEmployment.be  This test is not yet approved or cleared by the [place] FDA and has been authorized for detection and/or diagnosis of SARS-CoV-2 by FDA under an Emergency Use Authorization (EUA). This EUA will remain in effect (meaning this test can be used) for the duration of the COVID-19 declaration under Section 564(b)(1) of the Act, 21 U.S.C. section 360bbb-3(b)(1), unless the authorization is terminated or revoked.  Performed at [facility], [place] [address] [place], [place] [address]   Culture, blood (routine x 2)     Status: None (Preliminary result)   Collection Time: 03/25/20  9:55 AM   Specimen: BLOOD  Result Value Ref Range Status   Specimen Description   Final    BLOOD RIGHT ARM Performed at [facility] [address], [place], [place] [address]    Special Requests   Final    BOTTLES DRAWN AEROBIC AND ANAEROBIC Blood Culture results may not be optimal due to an inadequate volume of blood received in culture bottles Performed at [facility] [address] [place], [place] [address]    Culture   Final    NO GROWTH 2 DAYS  Performed at [facility] [address] [place], [place] [address]    Report Status PENDING  Incomplete  MRSA PCR Screening     Status: None   Collection Time: 03/26/20  1:50 PM   Specimen: Nasopharyngeal  Result Value Ref Range Status   MRSA by PCR NEGATIVE NEGATIVE Final    Comment:        The GeneXpert MRSA Assay (FDA approved for NASAL specimens only), is one component of a comprehensive MRSA colonization surveillance program. It is not intended to diagnose MRSA infection nor to guide or monitor treatment for MRSA infections. Performed at [facility], [place] [address], [place], [place] [address]   Aerobic/Anaerobic Culture (surgical/deep wound)     Status: None (Preliminary result)   Collection Time: 03/26/20  6:25 PM   Specimen: PATH Soft tissue  Result Value Ref Range Status   Specimen Description   Final    TISSUE RIGHT FIRST METATARSAL Performed at [facility] [address] [place], [place] [address]    Special Requests   Final    NONE Performed at [facility], [place] [address], [place], [place] [address]    Gram Stain NO WBC SEEN NO ORGANISMS SEEN   Final   Culture   Final    NO GROWTH < 12 HOURS Performed at [facility], [place] [address], [place],  [address]    Report Status PENDING  Incomplete  Aerobic/Anaerobic Culture (surgical/deep wound)     Status: None (Preliminary result)   Collection Time: 03/26/20  6:29 PM   Specimen: PATH Other; Tissue  Result Value Ref Range Status   Specimen Description   Final    TOE RIGHT THIRD TOE Performed at [facility] [address] [place], [place]  [address]    Special Requests   Final    NONE Performed at [facility], [place] [address] [place], [place] [address]    Gram Stain   Final    FEW WBC PRESENT, PREDOMINANTLY PMN FEW GRAM POSITIVE COCCI IN CLUSTERS    Culture   Final    TOO YOUNG TO  READ Performed at [facility], [place] [address], [place], [place] [address]    Report Status PENDING  Incomplete  Aerobic/Anaerobic Culture (surgical/deep wound)     Status: None (Preliminary result)   Collection Time: 03/26/20  6:42 PM   Specimen: PATH Other; Tissue  Result Value Ref Range Status   Specimen Description   Final    TISSUE LEFT FIFTH METATARSAL Performed at [facility], [place] [address] [place], [place] [address]    Special Requests   Final    NONE Performed at [facility], [place] [address] [place], [place] [address]    Gram Stain NO WBC SEEN NO ORGANISMS SEEN   Final   Culture   Final    NO GROWTH < 12 HOURS Performed at [facility], [place] [address], [place], [place] [address]    Report Status PENDING  Incomplete  Aerobic/Anaerobic Culture (surgical/deep wound)     Status: None (Preliminary result)   Collection Time: 03/26/20  6:55 PM   Specimen: Wound  Result Value Ref Range Status   Specimen Description   Final    WOUND FOOT LEFT Performed at [facility] [address], [place], [place] [address]    Special Requests   Final    NONE Performed at [facility], [place] [address], [place], [place] [address]    Gram Stain   Final    MODERATE WBC PRESENT, PREDOMINANTLY PMN FEW GRAM POSITIVE COCCI IN CLUSTERS    Culture   Final    TOO YOUNG TO READ Performed at [facility], [place] [address], [place], [place] [address]    Report Status PENDING  Incomplete  Aerobic/Anaerobic Culture (surgical/deep wound)     Status: None (Preliminary result)   Collection Time: 03/26/20  7:05 PM   Specimen: Wound; Tissue  Result Value Ref Range Status   Specimen Description   Final    WOUND LEFT 2ND METATARSAL Performed at [facility] [address], [place], [place] [address]    Special Requests   Final    NONE Performed at [facility], [place] [address], [place], [place] [address]    Gram Stain NO WBC SEEN NO ORGANISMS SEEN   Final   Culture   Final    NO GROWTH < 12 HOURS Performed at [facility], [place] [address] [place], [place] [address]    Report Status PENDING  Incomplete     Radiology Studies: MR FOOT RIGHT W WO CONTRAST  Result Date: 03/25/2020 CLINICAL DATA:  Diabetes, foot swelling. Prior amputation of the great toe. Ulcer along the third toe. EXAM: MRI OF THE RIGHT FOREFOOT WITHOUT AND WITH CONTRAST TECHNIQUE: Multiplanar, multisequence MR imaging of the right forefoot was performed before and after the administration of intravenous contrast. CONTRAST:  25mL GADAVIST GADOBUTROL 1 MMOL/ML IV SOLN COMPARISON:  Multiple exams, including radiographs from 03/03/2020 FINDINGS: Bones/Joint/Cartilage Absent great toe. 1.3 by 1.7 cm erosion along the plantar head of the first metatarsal with a notable surrounding edema in the metatarsal head and distal shaft,  suspicious for active osteomyelitis for example on image 26 of series 7. Nonvisualization of the medial first digit sesamoid, probably eroded. There is edema in the lateral sesamoid of the first digit. Prominent edema and enhancement in the distal phalanx of the third toe. Possible midshaft fracture versus bony destruction. Surrounding subcutaneous edema and enhancement in the third toe. Mild deformity of the distal metaphysis of the proximal phalanx third toe likely from an old fracture. Low-grade marrow edema is present in the head of the second metatarsal. Abnormal marrow edema and enhancement is present in the proximal, middle, and distal phalanges of the small toe with associated subcutaneous edema and enhancement. Possible fracture involving the distal head of the proximal phalanx of the small toe. Appearance concerning for osteomyelitis of the small toe. Ligaments Lisfranc ligament intact. Muscles and Tendons Diffuse low-grade edema and enhancement in the visualized  musculature of the forefoot, potentially from myositis or neurogenic edema. Thickening of the detached flexor hallucis longus tendon. Soft tissues Extensive edema and enhancement tracking in the dorsum of the foot and in the remaining toes, favoring cellulitis. I not observe a drainable abscess. There is some hypoenhancement in the plantar tissues along the ball of the foot below the second and third MTP joints, without visualized gas in the soft tissues, correlate with visual inspection in assessing for tissue necrosis. IMPRESSION: 1. Osteomyelitis of the head and distal shaft of the first metatarsal. 2. Prominent edema and enhancement in the distal phalanx of the third toe, favoring osteomyelitis, with possible midshaft fracture versus bony destruction. 3. Abnormal marrow edema and enhancement in the proximal, middle, and distal phalanges of the small toe, with associated subcutaneous edema and enhancement. Appearance favors osteomyelitis. Possible fracture involving the distal head of the proximal phalanx of the small toe. 4. Extensive edema and enhancement in the dorsum of the foot and in the remaining toes, favoring cellulitis. No drainable abscess. 5. Diffuse low-grade edema and enhancement in the visualized musculature of the forefoot, potentially from myositis or neurogenic edema. 6. Thickening of the detached flexor hallucis longus tendon, compatible with prior amputation. 7. There is some hypoenhancement in the plantar tissues along the ball of the foot below the second and third MTP joints, correlate with visual inspection in assessing for tissue necrosis. 8. Low-grade edema signal and enhancement in the head of the second metatarsal and adjacent base of the proximal phalanx second toe, possibly degenerative or reactive. Electronically Signed   By: Van Clines M.D.   On: 03/25/2020 14:36   MR FOOT LEFT W WO CONTRAST  Result Date: 03/25/2020 CLINICAL DATA:  Left foot pain and swelling.  Diabetic  foot ulcers. EXAM: MRI OF THE LEFT FOREFOOT WITHOUT AND WITH CONTRAST TECHNIQUE: Multiplanar, multisequence MR imaging of the left foot was performed both before and after administration of intravenous contrast. CONTRAST:  24mL GADAVIST GADOBUTROL 1 MMOL/ML IV SOLN COMPARISON:  9 cc Gadavist FINDINGS: Diffuse abnormal T1 and T2 signal intensity in the second metatarsal shaft and neck highly suspicious for osteomyelitis with an associated pathologic fracture through the metacarpal neck region. Marked surrounding soft tissue swelling/edema and rim enhancing fluid containing gas, consistent with an abscess. There is also a small open wound along the lateral aspect of the forefoot overlying the region of the fifth metatarsal head. There is associated abnormal T1 and T2 signal intensity in the fifth metacarpal head and subsequent contrast enhancement consistent with osteomyelitis. I do not see any definite findings for septic arthritis at the fifth MTP joint.  Diffuse and fairly marked subcutaneous soft tissue swelling/edema/fluid consistent with cellulitis. No subcutaneous abscesses are identified. There is also diffuse myofasciitis without definite findings for pyomyositis. IMPRESSION: 1. Findings consistent with osteomyelitis involving the second metatarsal shaft and neck with an associated pathologic fracture through the metacarpal neck region. There is a surrounding abscess. 2. Small small open wound along the lateral aspect of the forefoot with underlying osteomyelitis involving the fifth metatarsal head. 3. Diffuse and fairly marked subcutaneous soft tissue swelling/edema/fluid consistent with cellulitis. Electronically Signed   By: Marijo Sanes M.D.   On: 03/25/2020 14:40   DG Foot 2 Views Left  Result Date: 03/26/2020 CLINICAL DATA:  Left foot incision and drainage, right third toe partial amputation, right first metatarsal biopsy EXAM: RIGHT FOOT - 2 VIEW; LEFT FOOT - 2 VIEW COMPARISON:  03/25/2020,  03/24/2020 FINDINGS: Left foot: Frontal and lateral views of the left foot demonstrate postsurgical changes from incision and drainage of the forefoot. Partial resection of the head of the second metatarsal. The second metatarsal fracture seen previously is again noted. Diffuse soft tissue swelling of the forefoot. Right foot: Frontal and lateral views demonstrate interval amputation of the third digit at the level of the proximal interphalangeal joint. Previous first digit amputation at the metatarsophalangeal joint. Fracture of the distal margin of the fifth proximal phalanx is unchanged. There is diffuse soft tissue swelling of the forefoot. IMPRESSION: 1. Postsurgical changes left foot related to incision and drainage, with likely debridement of the second metatarsal head. Stable second metatarsal fracture. 2. Postsurgical changes from third digit amputation right foot. Stable fracture distal margin fifth proximal phalanx. Electronically Signed   By: Randa Ngo M.D.   On: 03/26/2020 22:25   DG Foot 2 Views Right  Result Date: 03/26/2020 CLINICAL DATA:  Left foot incision and drainage, right third toe partial amputation, right first metatarsal biopsy EXAM: RIGHT FOOT - 2 VIEW; LEFT FOOT - 2 VIEW COMPARISON:  03/25/2020, 03/24/2020 FINDINGS: Left foot: Frontal and lateral views of the left foot demonstrate postsurgical changes from incision and drainage of the forefoot. Partial resection of the head of the second metatarsal. The second metatarsal fracture seen previously is again noted. Diffuse soft tissue swelling of the forefoot. Right foot: Frontal and lateral views demonstrate interval amputation of the third digit at the level of the proximal interphalangeal joint. Previous first digit amputation at the metatarsophalangeal joint. Fracture of the distal margin of the fifth proximal phalanx is unchanged. There is diffuse soft tissue swelling of the forefoot. IMPRESSION: 1. Postsurgical changes left foot  related to incision and drainage, with likely debridement of the second metatarsal head. Stable second metatarsal fracture. 2. Postsurgical changes from third digit amputation right foot. Stable fracture distal margin fifth proximal phalanx. Electronically Signed   By: Randa Ngo M.D.   On: 03/26/2020 22:25     LOS: 2 days   Antonieta Pert, MD [facility]  03/27/2020, 1:16 PM

## 2020-03-27 NOTE — Progress Notes (Signed)
Pharmacy Antibiotic Note  Thomas [name] is a 69 y.o. male admitted on 03/25/2020 with osteomyelitis of R and L foot.  Pharmacy has been consulted for Vancomycin dosing. Patient also on Ceftriaxone and Metronidazole per MD.   Plan: Vancomycin 1750mg  IV x 1, then 1g IV q8h Vancomycin trough level at steady state, as indicated Ceftriaxone/Metronidazole per MD Monitor renal function, cultures, clinical course  Height: 6\' 7"  (200.7 cm) Weight: 85.3 kg (188 lb 1.6 oz) IBW/kg (Calculated) : 93.7  Temp (24hrs), Avg:97.8 F (36.6 C), Min:97.6 F (36.4 C), Max:98.1 F (36.7 C)  Recent Labs  Lab 03/24/20 1025 03/25/20 0954 03/26/20 0406 03/27/20 0546  WBC 12.6* 13.0* 6.1 6.7  CREATININE 0.92 0.79 0.74  --   LATICACIDVEN  --  1.4  --   --     Estimated Creatinine Clearance: 108.1 mL/min (by C-G formula based on SCr of 0.74 mg/dL).    Allergies  Allergen Reactions  . Chlorthalidone Other (See Comments)    "Makes me light-headed and I don't like the way it makes me feel"  . Voltaren [Diclofenac] Rash    Antimicrobials this admission: 12/2 Ceftriaxone >> 12/2 Metronidazole >> 12/4 Vancomycin >>  Dose adjustments this admission: --  Microbiology results: 12/2 BCx: NGTD 12/2 Respiratory panel: neg for COVID, Influenza A/B 12/3 MRSA PCR: neg 12/3 wound, L foot: few GPC in clusters on gram stain, cx in process 12/3 R 1st metatarsal: NGTD 12/3 R 3rd toe: few GPC in clusters on gram stain, cx in process 12/3 L 5th metatarsal: NGTD 12/3 L 2nd metatarsal: NGTD   Thank you for allowing pharmacy to be a part of this patient's care.   Lindell Spar, PharmD, BCPS Clinical Pharmacist  03/27/2020 3:02 PM

## 2020-03-27 NOTE — Progress Notes (Addendum)
[place] for IV heparin >> PO apixaban Indication: atrial flutter  Allergies  Allergen Reactions  . Chlorthalidone Other (See Comments)    "Makes me light-headed and I don't like the way it makes me feel"  . Voltaren [Diclofenac] Rash    Patient Measurements: Height: 6\' 7"  (200.7 cm) Weight: 85.3 kg (188 lb 1.6 oz) IBW/kg (Calculated) : 93.7 Heparin Dosing Weight: actual weight  Vital Signs: Temp: 98 F (36.7 C) (12/04 1232) Temp Source: Oral (12/04 1232) BP: 126/74 (12/04 1232) Pulse Rate: 59 (12/04 1232)  Labs: Recent Labs    03/25/20 0954 03/25/20 0954 03/25/20 1450 03/25/20 1450 03/25/20 2246 03/26/20 0406 03/27/20 0546  HGB 11.5*   < >  --   --   --  10.7* 11.6*  HCT 36.8*  --   --   --   --  33.6* 37.1*  PLT 251  --   --   --   --  223 270  APTT  --   --  53*   < > 68* 66* 79*  LABPROT  --   --  15.2  --   --   --   --   INR  --   --  1.3*  --   --   --   --   HEPARINUNFRC  --   --  1.84*  --   --   --  0.49  CREATININE 0.79  --   --   --   --  0.74  --    < > = values in this interval not displayed.    Estimated Creatinine Clearance: 108.1 mL/min (by C-G formula based on SCr of 0.74 mg/dL).   Medical History: Past Medical History:  Diagnosis Date  . Atrial flutter ([place]) 01/2020  . Cancer [facility])    prostate  . Claustrophobia    quite severe  . Heart failure with reduced ejection fraction ([place])   . Hypertension   . Hypoglycemia    occ  . Left knee DJD    Xray 12/23/08  . NICM (nonischemic cardiomyopathy) ([place]) 02/12/2020  . Prostate cancer [facility])     Assessment: 69 y/o male with history of atrial flutter on Apixaban (last dose PTA 03/25/20 at 0530) who presented with bilateral foot wounds. Due to concern for osteomyelitis and need for surgery, pharmacy consulted on admission to transition to IV heparin. Underwent surgery on 03/26/20. Per Podiatry, okay to resume Apixaban today. Pharmacy asked to transition from  IV heparin back to PO Apixaban.   03/27/2020 - aPTT 79 sec- therapeutic on heparin infusion 1500 units/hr - Heparin level 0.49- therapeutic/correlates with aPTT - Hgb 11.3-low/stable, Pltc WNL - No bleeding issues noted per nursing - SCr 0.74 (on 03/26/20)  Plan:  - Discontinue heparin infusion - At the same time, resume Apixaban 5mg  PO BID - Monitor CBC, renal function, and for s/sx of bleeding - Pharmacy will sign off at this time.   Lindell Spar, PharmD, BCPS Clinical Pharmacist  03/27/2020, 1:40 PM

## 2020-03-27 NOTE — Progress Notes (Signed)
ANTICOAGULATION CONSULT NOTE -   Pharmacy Consult for Heparin while apixaban on hold Indication: atrial fibrillation  Allergies  Allergen Reactions  . Chlorthalidone Other (See Comments)    "Makes me light-headed and I don't like the way it makes me feel"  . Voltaren [Diclofenac] Rash    Patient Measurements: Height: 6\' 7"  (200.7 cm) Weight: 85.3 kg (188 lb 1.6 oz) IBW/kg (Calculated) : 93.7 Heparin Dosing Weight: actual weight  Vital Signs: Temp: 97.7 F (36.5 C) (12/04 0537) Temp Source: Oral (12/04 0537) BP: 128/75 (12/04 0537) Pulse Rate: 55 (12/04 0537)  Labs: Recent Labs    03/24/20 1025 03/25/20 0954 03/25/20 0954 03/25/20 1450 03/25/20 1450 03/25/20 2246 03/26/20 0406 03/27/20 0546  HGB 11.9* 11.5*   < >  --   --   --  10.7* 11.6*  HCT 38.3 36.8*  --   --   --   --  33.6* 37.1*  PLT 323 251  --   --   --   --  223 270  APTT  --   --   --  53*   < > 68* 66* 79*  LABPROT  --   --   --  15.2  --   --   --   --   INR  --   --   --  1.3*  --   --   --   --   HEPARINUNFRC  --   --   --  1.84*  --   --   --  0.49  CREATININE 0.92 0.79  --   --   --   --  0.74  --    < > = values in this interval not displayed.    Estimated Creatinine Clearance: 108.1 mL/min (by C-G formula based on SCr of 0.74 mg/dL).   Medical History: Past Medical History:  Diagnosis Date  . Atrial flutter ([place]) 01/2020  . Cancer [facility])    prostate  . Claustrophobia    quite severe  . Heart failure with reduced ejection fraction ([place])   . Hypertension   . Hypoglycemia    occ  . Left knee DJD    Xray 12/23/08  . NICM (nonischemic cardiomyopathy) ([place]) 02/12/2020  . Prostate cancer ([place])     Medications:  Scheduled:  . amiodarone  200 mg Oral Daily  . atorvastatin  40 mg Oral Daily  . DULoxetine  60 mg Oral Daily  . fentaNYL      . metroNIDAZOLE  500 mg Oral Q8H  . oxyCODONE-acetaminophen  1 tablet Oral Q4H   And  . oxyCODONE  5 mg Oral Q4H  . predniSONE  10 mg Oral Q  breakfast  . torsemide  20 mg Oral BID   Infusions:  . cefTRIAXone (ROCEPHIN)  IV 2 g (03/26/20 1024)  . heparin 1,500 Units/hr (03/26/20 2122)   PRN: acetaminophen **OR** acetaminophen, morphine injection  Assessment: 69 yo male with history of afib on eliquis presents with bilateral foot wounds. Due to concern for osteomyelitis and possible need for surgery, pharmacy is consulted to transition to IV heparin.  Last dose of apixaban was 12/2 at 05:30  03/27/2020 -APTT 79 sec- therapeutic on heparin 1500 units/hr -Heparin level 0.49- therapeutic/correlates with aptt -Hgb 11.3-low/stable, plts WNL -No bleeding or infusion related concerns reported by RN  Goal of Therapy:  Heparin level 0.3-0.7 units/ml  PTT 66-102 seconds Monitor platelets by anticoagulation protocol: Yes   Plan:  - Continue heparin at 1500  units/hr - Monitor with heparin levels going forward - Daily heparin level & CBC while on heparin  Netta Cedars, PharmD 03/27/2020, 6:39 AM

## 2020-03-27 NOTE — Consult Note (Signed)
[facility]                                                                                        Patient Identification: Patient Name: Thomas [name] MRN: [id number] [place] Date: 03/25/2020  7:37 AM Today's Date: 03/27/2020 Reason for consult: Osteomyelitis   Active Problems:   Osteomyelitis of ankle or foot, acute, unspecified laterality (HCC)   Antibiotics: Ceftriaxone Day 3                    Metronidazole Day 3                      Assessment 1. RT Foot osteomyelitis ( 1st metatarsal, distal phalanx of third toe, small toe) and cellulitis  2. LT Foot Osteomyelitis ( 2nd metatarsal with surrounding abscess, 5th metatarsal) and cellulitis with lateral Ulcer  3. Prediabetes ( a1c 6.2)  Patient is s/p Rt third toe amputation at the PIP joint, Left foot wound excision and closure, Left second metatarsal I and D and left second metatarsectomy, Left 5th metatarsal and Rt first metatarsal bone biopsy on 12/3. OR cultures are showing GPC in clusters as of now in gram stain   Recommendations  Start on vancomycin, pharmacy to dose  Continue Ceftriaxone and metronidazole pending cultures finalization Following sensitivities  Basline ESR and CRP Monitor CBC and CMP while on IV abx Will need a PICC line for prolonged abx ( 6 weeks) possibly on Monday Will follow cultures for narrowing down antibiotics   Rest of the management as per the primary team. Please call with questions or concerns.  Thank you for the consult  Rosiland Oz, MD [facility]  Pager [phone]  To contact the attending provider between 8A-5P or the covering provider during after hours 5P-8A, please log into the web site [email or web address] and access using universal [place] password for that web site. If you do not have the password, please call the hospital  operator. __________________________________________________________________________________________________________ HPI and Hospital Course: Thomas [name] is a 69 y.o. male with a PMH significant of HFrEF, A flutter, HTN, Prediabetes, Bilateral joint replacement and chronic neuropathic ulcerations of both feet presented to the ED on 12/2 for concerns of OM of b/l feet after office visit on 03/24/2020 with his podiatrist. Of note, he has a history of recent surgery in September of this year with a right hallux amputation and second metatarsal osteotomy left foot.   He noticed that an ulcer had developed on his 3rd digit of his right foot few days prior to admit. He cleaned and bandaged it. The next day he had significant sloughing of the skin of that toe. He became concerned and tried to get in with his podiatrist. He wasn't able to see him until 1 day prior to admit who send patient to ED for concerns of OM.  He also noticed increased swelling and drainage for an ulcer on the dorsal aspect of the left foot for the similar duration  ROS: General- Denies fever, chills, loss of appetite and loss of weight HEENT -  Denies headache, blurry vision, neck pain, sinus pain Chest - Denies any chest pain, SOB or cough CVS- Denies any dizziness/lightheadedness, syncopal attacks, palpitations Abdomen- Denies any nausea, vomiting, abdominal pain, hematochezia and diarrhea Neuro - Denies any weakness, numbness, tingling sensation Psych - Denies any changes in mood irritability or depressive symptoms GU- Denies any burning, dysuria, hematuria or increased frequency of urination Skin - denies any rashes/lesions MSK - denies any joint pain/swelling or restricted ROM   Past Medical History:  Diagnosis Date  . Atrial flutter ([place]) 01/2020  . Cancer [facility])    prostate  . Claustrophobia    quite severe  . Heart failure with reduced ejection fraction ([place])   . Hypertension   . Hypoglycemia    occ  . Left knee  DJD    Xray 12/23/08  . NICM (nonischemic cardiomyopathy) ([place]) 02/12/2020  . Prostate cancer [facility])    Past Surgical History:  Procedure Laterality Date  . AMPUTATION TOE Right 12/2019  . AMPUTATION TOE Right 03/26/2020   Procedure: Right 3rd toe partial amputation, bone biopsy right 1st metatarsal;  Surgeon: Evelina Bucy, DPM;  Location: [facility];  Service: Podiatry;  Laterality: Right;  . BUBBLE STUDY  02/11/2020   Procedure: BUBBLE STUDY;  Surgeon: Geralynn Rile, MD;  Location: [place];  Service: Cardiovascular;;  . CARDIOVERSION N/A 02/11/2020   Procedure: CARDIOVERSION;  Surgeon: Geralynn Rile, MD;  Location: [place];  Service: Cardiovascular;  Laterality: N/A;  . CYSTOSCOPY  04/11/2018   Procedure: Erlene Quan;  Surgeon: [name], MD;  Location: [facility];  Service: Urology;;  NO SEEDS FOUND IN BLADDER  . HERNIA REPAIR  2009   inguinal  . IRRIGATION AND DEBRIDEMENT FOOT Left 03/26/2020   Procedure: Left foot incision and drainage with removal of all non-viable soft tissue and bone - areas overlying the 2nd and 5th metatarsals.;  Surgeon: Evelina Bucy, DPM;  Location: [facility];  Service: Podiatry;  Laterality: Left;  . JOINT REPLACEMENT Bilateral 2017   knees  . RADIOACTIVE SEED IMPLANT N/A 04/11/2018   Procedure: RADIOACTIVE SEED IMPLANT/BRACHYTHERAPY IMPLANT;  Surgeon: [name], MD;  Location: [facility];  Service: Urology;  Laterality: N/A;   71     SEEDS IMPLANTED  . RIGHT/LEFT HEART CATH AND CORONARY ANGIOGRAPHY N/A 02/09/2020   Procedure: RIGHT/LEFT HEART CATH AND CORONARY ANGIOGRAPHY;  Surgeon: Nelva Bush, MD;  Location: [place] [facility];  Service: Cardiovascular;  Laterality: N/A;  . SPACE OAR INSTILLATION N/A 04/11/2018   Procedure: SPACE OAR INSTILLATION;  Surgeon: [name], MD;  Location: [facility];  Service: Urology;  Laterality: N/A;  . TEE WITHOUT  CARDIOVERSION N/A 02/11/2020   Procedure: TRANSESOPHAGEAL ECHOCARDIOGRAM (TEE);  Surgeon: Geralynn Rile, MD;  Location: [place];  Service: Cardiovascular;  Laterality: N/A;  . TOTAL KNEE ARTHROPLASTY Bilateral 09/13/2015   Procedure: BILATERAL KNEE ARTHROPLASTY ;  Surgeon: Paralee Cancel, MD;  Location[facility];  Service: Orthopedics;  Laterality: Bilateral;     Scheduled Meds: . amiodarone  200 mg Oral Daily  . apixaban  5 mg Oral BID  . atorvastatin  40 mg Oral Daily  . DULoxetine  60 mg Oral Daily  . metroNIDAZOLE  500 mg Oral Q8H  . oxyCODONE-acetaminophen  1 tablet Oral Q4H   And  . oxyCODONE  5 mg Oral Q4H  . predniSONE  10 mg Oral Q breakfast  . torsemide  20 mg Oral BID  Continuous Infusions: . sodium chloride 250 mL (03/27/20 1025)  . cefTRIAXone (ROCEPHIN)  IV Stopped (03/27/20 1057)   PRN Meds:.sodium chloride, acetaminophen **OR** acetaminophen, morphine injection  Allergies  Allergen Reactions  . Chlorthalidone Other (See Comments)    "Makes me light-headed and I don't like the way it makes me feel"  . Voltaren [Diclofenac] Rash   Social History   Socioeconomic History  . Marital status: Married    Spouse name: Not on file  . Number of children: Not on file  . Years of education: Not on file  . Highest education level: Not on file  Occupational History  . Not on file  Tobacco Use  . Smoking status: Never Smoker  . Smokeless tobacco: Never Used  Vaping Use  . Vaping Use: Never used  Substance and Sexual Activity  . Alcohol use: No    Alcohol/week: 0.0 standard drinks  . Drug use: Not Currently  . Sexual activity: Yes  Other Topics Concern  . Not on file  Social History Narrative  . Not on file   Social Determinants of Health   Financial Resource Strain:   . Difficulty of Paying Living Expenses: Not on file  Food Insecurity:   . Worried About Charity fundraiser in the Last Year: Not on file  . Ran Out of Food in the Last Year: Not on  file  Transportation Needs:   . Lack of Transportation (Medical): Not on file  . Lack of Transportation (Non-Medical): Not on file  Physical Activity:   . Days of Exercise per Week: Not on file  . Minutes of Exercise per Session: Not on file  Stress:   . Feeling of Stress : Not on file  Social Connections:   . Frequency of Communication with Friends and Family: Not on file  . Frequency of Social Gatherings with Friends and Family: Not on file  . Attends Religious Services: Not on file  . Active Member of Clubs or Organizations: Not on file  . Attends Archivist Meetings: Not on file  . Marital Status: Not on file  Intimate Partner Violence:   . Fear of Current or Ex-Partner: Not on file  . Emotionally Abused: Not on file  . Physically Abused: Not on file  . Sexually Abused: Not on file      Vitals BP 126/74 (BP Location: Left Arm)   Pulse (!) 59   Temp 98 F (36.7 C) (Oral)   Resp 16   Ht '6\' 7"'  (2.007 m)   Wt 85.3 kg   SpO2 100%   BMI 21.19 kg/m    Physical Exam Constitutional:  white male sitting in bed with no apparent distress    Comments:   Cardiovascular:     Rate and Rhythm: Normal rate and regular rhythm.     Heart sounds:  Pulmonary:     Effort: Pulmonary effort is normal.     Comments:   Abdominal:     Palpations: Abdomen is soft.     Tenderness: Non tender   Musculoskeletal:        General: Bilateral foot is bandaged   Skin:    Comments: No obvious lesions/rashes  Neurological:     General: No focal deficit present.   Psychiatric:        Mood and Affect: Frustrated  Pertinent Microbiology Results for orders placed or performed during the hospital encounter of 03/25/20  Culture, blood (routine x 2)     Status: None (Preliminary  result)   Collection Time: 03/25/20  9:54 AM   Specimen: BLOOD  Result Value Ref Range Status   Specimen Description   Final    BLOOD LEFT ARM Performed at [facility]  [address], [place], [place] [address]    Special Requests   Final    BOTTLES DRAWN AEROBIC AND ANAEROBIC Blood Culture adequate volume Performed at [facility] [address], [place], [place] [address]    Culture   Final    NO GROWTH 2 DAYS Performed at [facility] [address] [place], [place] [address]    Report Status PENDING  Incomplete  Resp Panel by RT-PCR (Flu A&B, Covid) Nasopharyngeal Swab     Status: None   Collection Time: 03/25/20  9:54 AM   Specimen: Nasopharyngeal Swab; Nasopharyngeal(NP) swabs in vial transport medium  Result Value Ref Range Status   SARS Coronavirus 2 by RT PCR NEGATIVE NEGATIVE Final    Comment: (NOTE) SARS-CoV-2 target nucleic acids are NOT DETECTED.  The SARS-CoV-2 RNA is generally detectable in upper respiratory specimens during the acute phase of infection. The lowest concentration of SARS-CoV-2 viral copies this assay can detect is 138 copies/mL. A negative result does not preclude SARS-Cov-2 infection and should not be used as the sole basis for treatment or other patient management decisions. A negative result may occur with  improper specimen collection/handling, submission of specimen other than nasopharyngeal swab, presence of viral mutation(s) within the areas targeted by this assay, and inadequate number of viral copies(<138 copies/mL). A negative result must be combined with clinical observations, patient history, and epidemiological information. The expected result is Negative.  Fact Sheet for Patients:  EntrepreneurPulse.com.au  Fact Sheet for Healthcare Providers:  IncredibleEmployment.be  This test is no t yet approved or cleared by the [place] FDA and  has been authorized for detection and/or diagnosis of SARS-CoV-2 by FDA under an Emergency Use Authorization (EUA). This EUA will remain  in effect (meaning this test can be used) for the duration of  the COVID-19 declaration under Section 564(b)(1) of the Act, 21 U.S.C.section 360bbb-3(b)(1), unless the authorization is terminated  or revoked sooner.       Influenza A by PCR NEGATIVE NEGATIVE Final   Influenza B by PCR NEGATIVE NEGATIVE Final    Comment: (NOTE) The Xpert Xpress SARS-CoV-2/FLU/RSV plus assay is intended as an aid in the diagnosis of influenza from Nasopharyngeal swab specimens and should not be used as a sole basis for treatment. Nasal washings and aspirates are unacceptable for Xpert Xpress SARS-CoV-2/FLU/RSV testing.  Fact Sheet for Patients: EntrepreneurPulse.com.au  Fact Sheet for Healthcare Providers: IncredibleEmployment.be  This test is not yet approved or cleared by the [place] FDA and has been authorized for detection and/or diagnosis of SARS-CoV-2 by FDA under an Emergency Use Authorization (EUA). This EUA will remain in effect (meaning this test can be used) for the duration of the COVID-19 declaration under Section 564(b)(1) of the Act, 21 U.S.C. section 360bbb-3(b)(1), unless the authorization is terminated or revoked.  Performed at [facility], [place] [address], [place], [place] [address]   Culture, blood (routine x 2)     Status: None (Preliminary result)   Collection Time: 03/25/20  9:55 AM   Specimen: BLOOD  Result Value Ref Range Status   Specimen Description   Final    BLOOD RIGHT ARM Performed at [facility] [address] [place], [place] [address]  Special Requests   Final    BOTTLES DRAWN AEROBIC AND ANAEROBIC Blood Culture results may not be optimal due to an inadequate volume of blood received in culture bottles Performed at [facility] [address], [place], [place] [address]    Culture   Final    NO GROWTH 2 DAYS Performed at [facility] [address], [place], [place] [address]    Report Status PENDING   Incomplete  MRSA PCR Screening     Status: None   Collection Time: 03/26/20  1:50 PM   Specimen: Nasopharyngeal  Result Value Ref Range Status   MRSA by PCR NEGATIVE NEGATIVE Final    Comment:        The GeneXpert MRSA Assay (FDA approved for NASAL specimens only), is one component of a comprehensive MRSA colonization surveillance program. It is not intended to diagnose MRSA infection nor to guide or monitor treatment for MRSA infections. Performed at [facility], [place] [address] [place] [address], [place], [place] [address]   Aerobic/Anaerobic Culture (surgical/deep wound)     Status: None (Preliminary result)   Collection Time: 03/26/20  6:25 PM   Specimen: PATH Soft tissue  Result Value Ref Range Status   Specimen Description   Final    TISSUE RIGHT FIRST METATARSAL Performed at [facility] [address] [place], [place] [address]    Special Requests   Final    NONE Performed at [facility], [place] [address] [place], [place] [address]    Gram Stain NO WBC SEEN NO ORGANISMS SEEN   Final   Culture   Final    NO GROWTH < 12 HOURS Performed at [facility], [place] [address], [place], [place] [address]    Report Status PENDING  Incomplete  Aerobic/Anaerobic Culture (surgical/deep wound)     Status: None (Preliminary result)   Collection Time: 03/26/20  6:29 PM   Specimen: PATH Other; Tissue  Result Value Ref Range Status   Specimen Description   Final    TOE RIGHT THIRD TOE Performed at [facility] [address], [place], [place] [address]    Special Requests   Final    NONE Performed at [facility], [place] [address] [place], [place] [address]    Gram Stain   Final    FEW WBC PRESENT, PREDOMINANTLY PMN FEW GRAM POSITIVE COCCI IN CLUSTERS    Culture   Final    TOO YOUNG TO READ Performed at [facility], [place] [address] [place] [address] [place], [place] [address]    Report Status  PENDING  Incomplete  Aerobic/Anaerobic Culture (surgical/deep wound)     Status: None (Preliminary result)   Collection Time: 03/26/20  6:42 PM   Specimen: PATH Other; Tissue  Result Value Ref Range Status   Specimen Description   Final    TISSUE LEFT FIFTH METATARSAL Performed at [facility], [place] [address] [place], [place]  [address]    Special Requests   Final    NONE Performed at [facility], [place] [address] [place], [place] [address]    Gram Stain NO WBC SEEN NO ORGANISMS SEEN   Final   Culture   Final    NO GROWTH < 12 HOURS Performed at [facility], [place] [address] [place] [address] [place], [place] [address]    Report Status PENDING  Incomplete  Aerobic/Anaerobic Culture (surgical/deep wound)     Status: None (  Preliminary result)   Collection Time: 03/26/20  6:55 PM   Specimen: Wound  Result Value Ref Range Status   Specimen Description   Final    WOUND FOOT LEFT Performed at [facility] [address], [place], [place] [address]    Special Requests   Final    NONE Performed at [facility], [place] [address], [place], [place] [address]    Gram Stain   Final    MODERATE WBC PRESENT, PREDOMINANTLY PMN FEW GRAM POSITIVE COCCI IN CLUSTERS    Culture   Final    TOO YOUNG TO READ Performed at [facility], [place] [address], [place], [place] [address]    Report Status PENDING  Incomplete  Aerobic/Anaerobic Culture (surgical/deep wound)     Status: None (Preliminary result)   Collection Time: 03/26/20  7:05 PM   Specimen: Wound; Tissue  Result Value Ref Range Status   Specimen Description   Final    WOUND LEFT 2ND METATARSAL Performed at [facility] [address] [place], [place] [address]    Special Requests   Final    NONE Performed at [facility], [place] [address], [place], [place] [address]    Gram Stain NO WBC SEEN NO ORGANISMS SEEN   Final    Culture   Final    NO GROWTH < 12 HOURS Performed at [facility], [place] [address], [place], [place] [address]    Report Status PENDING  Incomplete    Pertinent Lab seen by me: CBC Latest Ref Rng & Units 03/27/2020 03/26/2020 03/25/2020  WBC 4.0 - 10.5 K/uL 6.7 6.1 13.0(H)  Hemoglobin 13.0 - 17.0 g/dL 11.6(L) 10.7(L) 11.5(L)  Hematocrit 39 - 52 % 37.1(L) 33.6(L) 36.8(L)  Platelets 150 - 400 K/uL 270 223 251   CMP Latest Ref Rng & Units 03/26/2020 03/25/2020 03/24/2020  Glucose 70 - 99 mg/dL 98 132(H) 92  BUN 8 - 23 mg/dL '18 18 17  ' Creatinine 0.61 - 1.24 mg/dL 0.74 0.79 0.92  Sodium 135 - 145 mmol/L 136 134(L) 139  Potassium 3.5 - 5.1 mmol/L 3.9 4.6 4.5  Chloride 98 - 111 mmol/L 99 94(L) 94(L)  CO2 22 - 32 mmol/L 28 29 30(H)  Calcium 8.9 - 10.3 mg/dL 8.8(L) 8.8(L) 9.1  Total Protein 6.5 - 8.1 g/dL 6.2(L) 7.4 6.8  Total Bilirubin 0.3 - 1.2 mg/dL 0.5 1.0 0.6  Alkaline Phos 38 - 126 U/L 89 108 156(H)  AST 15 - 41 U/L 13(L) 18 17  ALT 0 - 44 U/L '15 17 18     ' Pertinent Imagings/Other Imagings Plain films and CT images have been personally visualized and interpreted; radiology reports have been reviewed. Decision making incorporated into the Impression / Recommendations.  MRI left foot WWO contrast 03/25/20  FINDINGS: Diffuse abnormal T1 and T2 signal intensity in the second metatarsal shaft and neck highly suspicious for osteomyelitis with an associated pathologic fracture through the metacarpal neck region. Marked surrounding soft tissue swelling/edema and rim enhancing fluid containing gas, consistent with an abscess.  There is also a small open wound along the lateral aspect of the forefoot overlying the region of the fifth metatarsal head. There is associated abnormal T1 and T2 signal intensity in the fifth metacarpal head and subsequent contrast enhancement consistent with osteomyelitis. I do not see any definite findings for septic arthritis at the fifth MTP  joint.  Diffuse and fairly marked subcutaneous soft tissue swelling/edema/fluid consistent with  cellulitis. No subcutaneous abscesses are identified. There is also diffuse myofasciitis without definite findings for pyomyositis.  IMPRESSION: 1. Findings consistent with osteomyelitis involving the second metatarsal shaft and neck with an associated pathologic fracture through the metacarpal neck region. There is a surrounding abscess. 2. Small small open wound along the lateral aspect of the forefoot with underlying osteomyelitis involving the fifth metatarsal head. 3. Diffuse and fairly marked subcutaneous soft tissue swelling/edema/fluid consistent with cellulitis.  MRI Rt Foot WWO Contrast 03/25/20 IMPRESSION: 1. Osteomyelitis of the head and distal shaft of the first metatarsal. 2. Prominent edema and enhancement in the distal phalanx of the third toe, favoring osteomyelitis, with possible midshaft fracture versus bony destruction. 3. Abnormal marrow edema and enhancement in the proximal, middle, and distal phalanges of the small toe, with associated subcutaneous edema and enhancement. Appearance favors osteomyelitis. Possible fracture involving the distal head of the proximal phalanx of the small toe. 4. Extensive edema and enhancement in the dorsum of the foot and in the remaining toes, favoring cellulitis. No drainable abscess. 5. Diffuse low-grade edema and enhancement in the visualized musculature of the forefoot, potentially from myositis or neurogenic edema. 6. Thickening of the detached flexor hallucis longus tendon, compatible with prior amputation. 7. There is some hypoenhancement in the plantar tissues along the ball of the foot below the second and third MTP joints, correlate with visual inspection in assessing for tissue necrosis. 8. Low-grade edema signal and enhancement in the head of the second metatarsal and adjacent base of the proximal phalanx second  toe, possibly degenerative or reactive.  I have spent greater than 60 minutes for this patient encounter including review of prior medical records with greater than 50% of time being face to face and coordination of their care.

## 2020-03-27 NOTE — Evaluation (Signed)
Physical Therapy Evaluation 1x Patient Details Name: Thomas [name] MRN: [id number] DOB: Sep 12, 1950 Today's Date: 03/27/2020   History of Present Illness  69 yo male admitted with osteomyelitis. S/P R 1st met biopsy, R 3rd toe amputation, L 5th met biopsy, L foot wound excision, L 2nd met I&D, L 2nd metatarsectomy 03/26/20  Clinical Impression  On eval, pt was Mod Ind with mobility. He walked ~50 feet without a device. He was able to don/doff post op shoes without assistance. Minimal pain with activity. Pt was surprised to hear that a PT eval was ordered. He reports he has been walking to/from bathroom unassisted. No further PT needs. 1x eval. Will sign off. OT eval is not warranted (no needs).     Follow Up Recommendations No PT follow up    Equipment Recommendations  None recommended by PT    Recommendations for Other Services       Precautions / Restrictions Precautions Precautions: Fall Required Braces or Orthoses:  (bil post op/surgical shoes) Restrictions Weight Bearing Restrictions: No Other Position/Activity Restrictions: WBAT with post op shoes      Mobility  Bed Mobility Overal bed mobility: Modified Independent                  Transfers Overall transfer level: Modified independent                  Ambulation/Gait Ambulation/Gait assistance: Modified independent (Device/Increase time) Gait Distance (Feet): 50 Feet Assistive device: None Gait Pattern/deviations: Wide base of support;Decreased stride length     General Gait Details: cautious gait pattern. no lob.  Stairs            Wheelchair Mobility    Modified Rankin (Stroke Patients Only)       Balance Overall balance assessment: Mild deficits observed, not formally tested                                           Pertinent Vitals/Pain Pain Assessment: 0-10 Pain Score: 2  Pain Location: bil feet Pain Descriptors / Indicators: Aching Pain  Intervention(s): Monitored during session;Limited activity within patient's tolerance    Home Living Family/patient expects to be discharged to:: Private residence   Available Help at Discharge: Family;Available 24 hours/day Type of Home: House Home Access: Stairs to enter Entrance Stairs-Rails: None Entrance Stairs-Number of Steps: 2 Home Layout: One level Home Equipment: Walker - 2 wheels;Bedside commode;Grab bars - tub/shower Additional Comments: wife is visually impaired, 44 yo son lives at home and can assist as needed    Prior Function Level of Independence: Independent         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        Extremity/Trunk Assessment   Upper Extremity Assessment Upper Extremity Assessment: Overall WFL for tasks assessed    Lower Extremity Assessment Lower Extremity Assessment: Generalized weakness    Cervical / Trunk Assessment Cervical / Trunk Assessment: Normal  Communication   Communication: No difficulties  Cognition Arousal/Alertness: Awake/alert Behavior During Therapy: WFL for tasks assessed/performed Overall Cognitive Status: Within Functional Limits for tasks assessed                                        General Comments  Exercises     Assessment/Plan    PT Assessment Patent does not need any further PT services  PT Problem List         PT Treatment Interventions      PT Goals (Current goals can be found in the Care Plan section)  Acute Rehab PT Goals Patient Stated Goal: home soon PT Goal Formulation: All assessment and education complete, DC therapy    Frequency     Barriers to discharge        Co-evaluation               AM-PAC PT "6 Clicks" Mobility  Outcome Measure Help needed turning from your back to your side while in a flat bed without using bedrails?: None Help needed moving from lying on your back to sitting on the side of a flat bed without using bedrails?:  None Help needed moving to and from a bed to a chair (including a wheelchair)?: None Help needed standing up from a chair using your arms (e.g., wheelchair or bedside chair)?: None Help needed to walk in hospital room?: None Help needed climbing 3-5 steps with a railing? : A Little 6 Click Score: 23    End of Session   Activity Tolerance: Patient tolerated treatment well Patient left: in bed;with call bell/phone within reach        Time: 4174-0814 PT Time Calculation (min) (ACUTE ONLY): 9 min   Charges:   PT Evaluation $PT Eval Low Complexity: [place], PT Acute Rehabilitation  Office: [phone] Pager: [phone]

## 2020-03-28 LAB — CBC
HCT: 35.7 % — ABNORMAL LOW (ref 39.0–52.0)
Hemoglobin: 10.9 g/dL — ABNORMAL LOW (ref 13.0–17.0)
MCH: 25.6 pg — ABNORMAL LOW (ref 26.0–34.0)
MCHC: 30.5 g/dL (ref 30.0–36.0)
MCV: 84 fL (ref 80.0–100.0)
Platelets: 279 10*3/uL (ref 150–400)
RBC: 4.25 MIL/uL (ref 4.22–5.81)
RDW: 18.7 % — ABNORMAL HIGH (ref 11.5–15.5)
WBC: 5.1 10*3/uL (ref 4.0–10.5)
nRBC: 0 % (ref 0.0–0.2)

## 2020-03-28 LAB — CREATININE, SERUM
Creatinine, Ser: 0.94 mg/dL (ref 0.61–1.24)
GFR, Estimated: >60 mL/min (ref >60)

## 2020-03-28 LAB — C-REACTIVE PROTEIN: CRP: 2.7 mg/dL — ABNORMAL HIGH (ref <1.0)

## 2020-03-28 LAB — SEDIMENTATION RATE: Sed Rate: 20 mm/hr — ABNORMAL HIGH (ref 0–16)

## 2020-03-28 NOTE — Progress Notes (Signed)
OT Cancellation Note  Patient Details Name: Thomas [name] MRN: [id number] DOB: 1950-08-24   Cancelled Treatment:    Reason Eval/Treat Not Completed: OT screened, no needs identified, will sign off. Per PT patient is Mod I and reports no therapy needs.  Laksh Hinners L Brindy Higginbotham 03/28/2020, 6:52 AM

## 2020-03-28 NOTE — Progress Notes (Signed)
Surgical dressing change as ordered, but no opening noted in the surgical area for iodoform packing, staples in place, No sign of infection noted.

## 2020-03-28 NOTE — Progress Notes (Signed)
PROGRESS NOTE    Thomas [name]  YHC:[id number] DOB: 1950-09-15 DOA: 03/25/2020 PCP: [name], MD   Chief Complaint  Patient presents with  . Foot Pain   Brief Narrative: 69 year old male with history of HFrEF, a flutter, hypertension presented with bilateral foot wounds, he had initially developed ulcer on his third digit of his right foot denied any injury.  He was seen in the podiatry office and sent to the ED for MRI and IV antibiotics and possible amputation  Subjective: Pain is controlled while he is taking oral medication.  4.  Wife is at the bedside. No other new complaints. Took iv morphine last night to go to sleep but does not like to take it  Underwent multiple procedure for his bilateral foot by Dr. March Rummage 12/3   Assessment & Plan:  Osteomyelitis bilaterally, concern for abscess on the left foot.  MRI reviewed see report.  Seen by Dr. March Rummage and underwent multiple procedures on his bilateral foot and toes, see procedure note as below on 12/3, follow-up Intra-Op wound culture, so far Gram stain unremarkable except for left foot wound which shows GPC in stain, RT third Toe woudn also with GPC in stain-appreciate ID input ceftriaxone changed to vancomycin, on Flagyl.  May need prolonged antibiotics and PICC line will await further culture and ID plan   A flutter ,on Eliquis .  Currently in sinus.  Now back on Eliquis was on heparin periop continue amiodarone.  HFrEF/NICM last echo EF 15%.  Remains euvolemic, no shortness of breath or hypoxia.  Continue home torsemide,Cardiac cath 02/06/2028 w/ no significant coronary artery disease.  HLD on statin  Normocytic anemia monitor  Rheumatoid arthritis/polyarthropathy/chronic pain/opiate dependence on Percocet 10:Continue pain control with oral opiates and IV opiates for severe pain.  Continue home prednisone 10 mg.  PT OT evaluation.  Nutrition: Diet Order            Diet Heart Room service appropriate? Yes; Fluid  consistency: Thin  Diet effective now                 Body mass index is 21.3 kg/m.  DVT prophylaxis: heparin Code Status:   Code Status: DNR  Family Communication: plan of care discussed with patient at bedside.  Status is: Inpatient  Remains inpatient appropriate because:IV treatments appropriate due to intensity of illness or inability to take PO, Inpatient level of care appropriate due to severity of illness and For ongoing management of his osteomyelitis needing surgical intervention and IV antibiotics  Dispo:The patient is from: Home            Anticipated d/c is to: Home w/ [facility]            Anticipated d/c date is: 1-2 days            Patient currently is not medically stable to d/c.   Consultants:see note  Procedures: 1) Right first metatarsal bone biopsy 2) Right third toe amputation PIPJ 3) Left fifth metatarsal bone biopsy 4) Left foot wound excision and closure 5) Left second metatarsal incision and drainage below the deep fascia 6) Left second metatarsectomy  Culture/Microbiology    Component Value Date/Time   SDES  03/26/2020 1905    WOUND LEFT 2ND METATARSAL Performed at [facility] [address] [place], [place] [address]    SPECREQUEST  03/26/2020 1905    NONE Performed at [facility], [place] [address], [place], [place] [address]  CULT  03/26/2020 1905    NO GROWTH < 12 HOURS Performed at [facility] [address], [place], [place] [address]    REPTSTATUS PENDING 03/26/2020 1905    Other culture-see note  Medications: Scheduled Meds: . amiodarone  200 mg Oral Daily  . apixaban  5 mg Oral BID  . atorvastatin  40 mg Oral Daily  . DULoxetine  60 mg Oral Daily  . metroNIDAZOLE  500 mg Oral Q8H  . oxyCODONE-acetaminophen  1 tablet Oral Q4H   And  . oxyCODONE  5 mg Oral Q4H  . predniSONE  10 mg Oral Q breakfast  . torsemide  20 mg Oral BID   Continuous Infusions: . sodium chloride 250 mL  (03/27/20 1624)  . cefTRIAXone (ROCEPHIN)  IV Stopped (03/27/20 1057)  . vancomycin 1,000 mg (03/28/20 0759)    Antimicrobials: Anti-infectives (From admission, onward)   Start     Dose/Rate Route Frequency Ordered Stop   03/28/20 0000  vancomycin (VANCOCIN) IVPB 1000 mg/200 mL premix        1,000 mg 200 mL/hr over 60 Minutes Intravenous Every 8 hours 03/27/20 1505     03/27/20 1530  vancomycin (VANCOREADY) IVPB 1750 mg/350 mL        1,750 mg 175 mL/hr over 120 Minutes Intravenous STAT 03/27/20 1502 03/27/20 1827   03/26/20 1829  vancomycin (VANCOCIN) powder  Status:  Discontinued          As needed 03/26/20 1834 03/26/20 1953   03/25/20 0945  cefTRIAXone (ROCEPHIN) 2 g in sodium chloride 0.9 % 100 mL IVPB       "And" Linked Group Details   2 g 200 mL/hr over 30 Minutes Intravenous Every 24 hours 03/25/20 0941     03/25/20 0945  metroNIDAZOLE (FLAGYL) tablet 500 mg       "And" Linked Group Details   500 mg Oral Every 8 hours 03/25/20 0941       Objective: Vitals: Today's Vitals   03/27/20 2030 03/27/20 2056 03/28/20 0503 03/28/20 0747  BP:  124/65 127/70   Pulse:  (!) 54 (!) 55   Resp:  18 18   Temp:  98.1 F (36.7 C) 98.4 F (36.9 C)   TempSrc:  Oral Oral   SpO2:  98% 99%   Weight:   85.8 kg   Height:      PainSc: 2    7     Intake/Output Summary (Last 24 hours) at 03/28/2020 1141 Last data filed at 03/28/2020 0524 Gross per 24 hour  Intake 848.61 ml  Output 1325 ml  Net -476.39 ml   Filed Weights   03/26/20 0500 03/27/20 0537 03/28/20 0503  Weight: 85.5 kg 85.3 kg 85.8 kg   Weight change: 0.454 kg  Intake/Output from previous day: 12/04 0701 - 12/05 0700 In: 1008.6 [I.V.:158.6; IV Piggyback:850] Out: 2075 [Urine:2075] Intake/Output this shift: No intake/output data recorded.  Examination: General exam: AAOx3 , NAD, weak appearing. HEENT:Oral mucosa moist, Ear/Nose WNL grossly, dentition normal. Respiratory system: bilaterally clear,no wheezing or  crackles,no use of accessory muscle Cardiovascular system: S1 & S2 +, No JVD,. Gastrointestinal system: Abdomen soft, NT,ND, BS+ Nervous System:Alert, awake, moving extremities and grossly nonfocal Extremities: No edema, bilateral foot in dressing did not remove  Skin: No rashes,no icterus. MSK: Normal muscle bulk,tone, power   Data Reviewed: I have personally reviewed following labs and imaging studies CBC: Recent Labs  Lab 03/24/20 1025 03/25/20 0954 03/26/20 0406 03/27/20 0546 03/28/20 4650  WBC 12.6* 13.0* 6.1 6.7 5.1  NEUTROABS  --  11.7*  --   --   --   HGB 11.9* 11.5* 10.7* 11.6* 10.9*  HCT 38.3 36.8* 33.6* 37.1* 35.7*  MCV 82 82.7 82.8 83.0 84.0  PLT 323 251 223 270 300   Basic Metabolic Panel: Recent Labs  Lab 03/24/20 1025 03/25/20 0954 03/26/20 0406 03/28/20 0516  NA 139 134* 136  --   K 4.5 4.6 3.9  --   CL 94* 94* 99  --   CO2 30* 29 28  --   GLUCOSE 92 132* 98  --   BUN 17 18 18   --   CREATININE 0.92 0.79 0.74 0.94  CALCIUM 9.1 8.8* 8.8*  --    GFR: Estimated Creatinine Clearance: 92.5 mL/min (by C-G formula based on SCr of 0.94 mg/dL). Liver Function Tests: Recent Labs  Lab 03/24/20 1025 03/25/20 0954 03/26/20 0406  AST 17 18 13*  ALT 18 17 15   ALKPHOS 156* 108 89  BILITOT 0.6 1.0 0.5  PROT 6.8 7.4 6.2*  ALBUMIN 4.1 3.6 3.0*   No results for input(s): LIPASE, AMYLASE in the last 168 hours. No results for input(s): AMMONIA in the last 168 hours. Coagulation Profile: Recent Labs  Lab 03/25/20 1450  INR 1.3*   Cardiac Enzymes: No results for input(s): CKTOTAL, CKMB, CKMBINDEX, TROPONINI in the last 168 hours. BNP (last 3 results) No results for input(s): PROBNP in the last 8760 hours. HbA1C: No results for input(s): HGBA1C in the last 72 hours. CBG: No results for input(s): GLUCAP in the last 168 hours. Lipid Profile: No results for input(s): CHOL, HDL, LDLCALC, TRIG, CHOLHDL, LDLDIRECT in the last 72 hours. Thyroid Function  Tests: No results for input(s): TSH, T4TOTAL, FREET4, T3FREE, THYROIDAB in the last 72 hours. Anemia Panel: No results for input(s): VITAMINB12, FOLATE, FERRITIN, TIBC, IRON, RETICCTPCT in the last 72 hours. Sepsis Labs: Recent Labs  Lab 03/25/20 0954  LATICACIDVEN 1.4    Recent Results (from the past 240 hour(s))  Culture, blood (routine x 2)     Status: None (Preliminary result)   Collection Time: 03/25/20  9:54 AM   Specimen: BLOOD  Result Value Ref Range Status   Specimen Description   Final    BLOOD LEFT ARM Performed at [facility] [address] [place], [place] [address]    Special Requests   Final    BOTTLES DRAWN AEROBIC AND ANAEROBIC Blood Culture adequate volume Performed at [facility] [address], [place], [place] [address]    Culture   Final    NO GROWTH 3 DAYS Performed at [facility], [place] [address], [place], [place] [address]    Report Status PENDING  Incomplete  Resp Panel by RT-PCR (Flu A&B, Covid) Nasopharyngeal Swab     Status: None   Collection Time: 03/25/20  9:54 AM   Specimen: Nasopharyngeal Swab; Nasopharyngeal(NP) swabs in vial transport medium  Result Value Ref Range Status   SARS Coronavirus 2 by RT PCR NEGATIVE NEGATIVE Final    Comment: (NOTE) SARS-CoV-2 target nucleic acids are NOT DETECTED.  The SARS-CoV-2 RNA is generally detectable in upper respiratory specimens during the acute phase of infection. The lowest concentration of SARS-CoV-2 viral copies this assay can detect is 138 copies/mL. A negative result does not preclude SARS-Cov-2 infection and should not be used as the sole basis for treatment or other patient management decisions. A negative result may occur with  improper specimen collection/handling, submission of specimen other than nasopharyngeal swab, presence of viral mutation(s) within the areas targeted by this assay, and inadequate number of viral copies(<138  copies/mL). A negative result must be combined with clinical observations, patient history, and epidemiological information. The expected result is Negative.  Fact Sheet for Patients:  EntrepreneurPulse.com.au  Fact Sheet for Healthcare Providers:  IncredibleEmployment.be  This test is no t yet approved or cleared by the [place] FDA and  has been authorized for detection and/or diagnosis of SARS-CoV-2 by FDA under an Emergency Use Authorization (EUA). This EUA will remain  in effect (meaning this test can be used) for the duration of the COVID-19 declaration under Section 564(b)(1) of the Act, 21 U.S.C.section 360bbb-3(b)(1), unless the authorization is terminated  or revoked sooner.       Influenza A by PCR NEGATIVE NEGATIVE Final   Influenza B by PCR NEGATIVE NEGATIVE Final    Comment: (NOTE) The Xpert Xpress SARS-CoV-2/FLU/RSV plus assay is intended as an aid in the diagnosis of influenza from Nasopharyngeal swab specimens and should not be used as a sole basis for treatment. Nasal washings and aspirates are unacceptable for Xpert Xpress SARS-CoV-2/FLU/RSV testing.  Fact Sheet for Patients: EntrepreneurPulse.com.au  Fact Sheet for Healthcare Providers: IncredibleEmployment.be  This test is not yet approved or cleared by the [place] FDA and has been authorized for detection and/or diagnosis of SARS-CoV-2 by FDA under an Emergency Use Authorization (EUA). This EUA will remain in effect (meaning this test can be used) for the duration of the COVID-19 declaration under Section 564(b)(1) of the Act, 21 U.S.C. section 360bbb-3(b)(1), unless the authorization is terminated or revoked.  Performed at [facility], [place] [address] [place], [place] [address]   Culture, blood (routine x 2)     Status: None (Preliminary result)   Collection Time: 03/25/20  9:55 AM   Specimen:  BLOOD  Result Value Ref Range Status   Specimen Description   Final    BLOOD RIGHT ARM Performed at [facility] [address], [place], [place] [address]    Special Requests   Final    BOTTLES DRAWN AEROBIC AND ANAEROBIC Blood Culture results may not be optimal due to an inadequate volume of blood received in culture bottles Performed at [facility] [address], [place], [place] [address]    Culture   Final    NO GROWTH 3 DAYS Performed at [facility], [place] [address], [place], [place] [address]    Report Status PENDING  Incomplete  MRSA PCR Screening     Status: None   Collection Time: 03/26/20  1:50 PM   Specimen: Nasopharyngeal  Result Value Ref Range Status   MRSA by PCR NEGATIVE NEGATIVE Final    Comment:        The GeneXpert MRSA Assay (FDA approved for NASAL specimens only), is one component of a comprehensive MRSA colonization surveillance program. It is not intended to diagnose MRSA infection nor to guide or monitor treatment for MRSA infections. Performed at [facility], [place] [address] [place], [place] [address]   Aerobic/Anaerobic Culture (surgical/deep wound)     Status: None (Preliminary result)   Collection Time: 03/26/20  6:25 PM   Specimen: PATH Soft tissue  Result Value Ref Range Status   Specimen Description   Final    TISSUE RIGHT FIRST METATARSAL Performed at [facility] [address], [place], [place] [address]  Special Requests   Final    NONE Performed at [facility], [place] [address], [place], [place] [address]    Gram Stain NO WBC SEEN NO ORGANISMS SEEN   Final   Culture   Final    NO GROWTH < 12 HOURS Performed at [facility], [place] [address], [place], [place] [address]    Report Status PENDING  Incomplete  Aerobic/Anaerobic Culture (surgical/deep wound)     Status: None (Preliminary result)   Collection Time: 03/26/20   6:29 PM   Specimen: PATH Other; Tissue  Result Value Ref Range Status   Specimen Description   Final    TOE RIGHT THIRD TOE Performed at [facility] [address], [place], [place] [address]    Special Requests   Final    NONE Performed at [facility], [place] [address] [place], [place] [address]    Gram Stain   Final    FEW WBC PRESENT, PREDOMINANTLY PMN FEW GRAM POSITIVE COCCI IN CLUSTERS    Culture   Final    TOO YOUNG TO READ Performed at [facility], [place] [address] [place], [place] [address]    Report Status PENDING  Incomplete  Aerobic/Anaerobic Culture (surgical/deep wound)     Status: None (Preliminary result)   Collection Time: 03/26/20  6:42 PM   Specimen: PATH Other; Tissue  Result Value Ref Range Status   Specimen Description   Final    TISSUE LEFT FIFTH METATARSAL Performed at [facility], [place] [address], [place], [place] [address]    Special Requests   Final    NONE Performed at [facility], [place] [address], [place], [place] [address]    Gram Stain NO WBC SEEN NO ORGANISMS SEEN   Final   Culture   Final    NO GROWTH < 12 HOURS Performed at [facility], [place] [address], [place], [place] [address]    Report Status PENDING  Incomplete  Aerobic/Anaerobic Culture (surgical/deep wound)     Status: None (Preliminary result)   Collection Time: 03/26/20  6:55 PM   Specimen: Wound  Result Value Ref Range Status   Specimen Description   Final    WOUND FOOT LEFT Performed at [facility] [address] [place], [place] [address]    Special Requests   Final    NONE Performed at [facility], [place] [address] [place], [place] [address]    Gram Stain   Final    MODERATE WBC PRESENT, PREDOMINANTLY PMN FEW GRAM POSITIVE COCCI IN CLUSTERS    Culture   Final    TOO YOUNG TO READ Performed at [facility], [place] [address],  [place], [place] [address]    Report Status PENDING  Incomplete  Aerobic/Anaerobic Culture (surgical/deep wound)     Status: None (Preliminary result)   Collection Time: 03/26/20  7:05 PM   Specimen: Wound; Tissue  Result Value Ref Range Status   Specimen Description   Final    WOUND LEFT 2ND METATARSAL Performed at [facility] [address] [place],  [address]    Special Requests   Final    NONE Performed at [facility], [place] [address] [place], [place] [address]    Gram Stain NO WBC SEEN NO ORGANISMS SEEN   Final   Culture   Final    NO GROWTH < 12 HOURS  Performed at [facility], [place] [address], [place], [place] [address]    Report Status PENDING  Incomplete     Radiology Studies: DG Foot 2 Views Left  Result Date: 03/26/2020 CLINICAL DATA:  Left foot incision and drainage, right third toe partial amputation, right first metatarsal biopsy EXAM: RIGHT FOOT - 2 VIEW; LEFT FOOT - 2 VIEW COMPARISON:  03/25/2020, 03/24/2020 FINDINGS: Left foot: Frontal and lateral views of the left foot demonstrate postsurgical changes from incision and drainage of the forefoot. Partial resection of the head of the second metatarsal. The second metatarsal fracture seen previously is again noted. Diffuse soft tissue swelling of the forefoot. Right foot: Frontal and lateral views demonstrate interval amputation of the third digit at the level of the proximal interphalangeal joint. Previous first digit amputation at the metatarsophalangeal joint. Fracture of the distal margin of the fifth proximal phalanx is unchanged. There is diffuse soft tissue swelling of the forefoot. IMPRESSION: 1. Postsurgical changes left foot related to incision and drainage, with likely debridement of the second metatarsal head. Stable second metatarsal fracture. 2. Postsurgical changes from third digit amputation right foot. Stable fracture distal margin fifth proximal phalanx.  Electronically Signed   By: Randa Ngo M.D.   On: 03/26/2020 22:25   DG Foot 2 Views Right  Result Date: 03/26/2020 CLINICAL DATA:  Left foot incision and drainage, right third toe partial amputation, right first metatarsal biopsy EXAM: RIGHT FOOT - 2 VIEW; LEFT FOOT - 2 VIEW COMPARISON:  03/25/2020, 03/24/2020 FINDINGS: Left foot: Frontal and lateral views of the left foot demonstrate postsurgical changes from incision and drainage of the forefoot. Partial resection of the head of the second metatarsal. The second metatarsal fracture seen previously is again noted. Diffuse soft tissue swelling of the forefoot. Right foot: Frontal and lateral views demonstrate interval amputation of the third digit at the level of the proximal interphalangeal joint. Previous first digit amputation at the metatarsophalangeal joint. Fracture of the distal margin of the fifth proximal phalanx is unchanged. There is diffuse soft tissue swelling of the forefoot. IMPRESSION: 1. Postsurgical changes left foot related to incision and drainage, with likely debridement of the second metatarsal head. Stable second metatarsal fracture. 2. Postsurgical changes from third digit amputation right foot. Stable fracture distal margin fifth proximal phalanx. Electronically Signed   By: Randa Ngo M.D.   On: 03/26/2020 22:25     LOS: 3 days   Antonieta Pert, MD [facility]  03/28/2020, 11:41 AM

## 2020-03-28 NOTE — Progress Notes (Signed)
  Subjective:  Patient ID: IZEN PETZ, male    DOB: 25-Aug-1950,  MRN: [id number]  Patient was seen at bedside.  He is resting comfortably.  He is eager to get out of the hospital.  Negative for chest pain and shortness of breath Fever: no Night sweats: no Constitutional signs: no Objective:   Vitals:   03/27/20 2056 03/28/20 0503  BP: 124/65 127/70  Pulse: (!) 54 (!) 55  Resp: 18 18  Temp: 98.1 F (36.7 C) 98.4 F (36.9 C)  SpO2: 98% 99%   General AA&O x3. Normal mood and affect.  Vascular Dorsalis pedis and posterior tibial pulses 2/4 bilat. Brisk capillary refill to all digits. Pedal hair present.  Neurologic Epicritic sensation grossly absent.  Dermatologic  dressing clean dry and intact  Orthopedic: MMT 5/5 in dorsiflexion, plantarflexion, inversion, and eversion. Normal joint ROM without pain or crepitus.    Assessment & Plan:  Patient was evaluated and treated and all questions answered.  Status post I&D of abscess and osteomyelitis bilateral foot -Continue daily dressing changes -Cultures with GPC's in clusters.  Have not speciated.  Continue empiric antibiotics -WBAT in surgical shoes -He indicated that he would be open and would like to have some assistance with his home dressing changes if home wound care could be arranged.  I have placed an order for home dressing instructions. -No further surgical plans from our standpoint -He should follow up with Dr. Posey Pronto 1 week after discharge in our [facility] office  [place] [address], [place] [address]  Tekesha Almgren R Takeru Bose, DPM  Accessible via secure chat for questions or concerns.

## 2020-03-28 NOTE — Discharge Instructions (Signed)
Home wound care nursing instructions:  Right foot: Non-stick dressing (Adaptic/xeroform), 4x4, kerlix, ACE bandage. Change every other day.  Left foot: flush wound 2nd metatarsal area with saline flush. Pack gently with iodoform packing daily or every other day. Cover with non-stick dressing, 4x4, kerlix, ACE.

## 2020-03-29 ENCOUNTER — Inpatient Hospital Stay (HOSPITAL_COMMUNITY): Payer: Medicare Other

## 2020-03-29 ENCOUNTER — Inpatient Hospital Stay: Payer: Self-pay

## 2020-03-29 DIAGNOSIS — M86171 Other acute osteomyelitis, right ankle and foot: Secondary | ICD-10-CM | POA: Diagnosis not present

## 2020-03-29 DIAGNOSIS — B9561 Methicillin susceptible Staphylococcus aureus infection as the cause of diseases classified elsewhere: Secondary | ICD-10-CM

## 2020-03-29 IMAGING — US IR PICC >5YO
2 series · 4 of 4 positions shown · non-contrast
Comparison: none

INDICATION: PICC line requested for prolonged IV antibiotic therapy for
osteomyelitis of the foot.

[Series 1: (phone_number) · 3 of 3 slices shown]
[im 1/3]
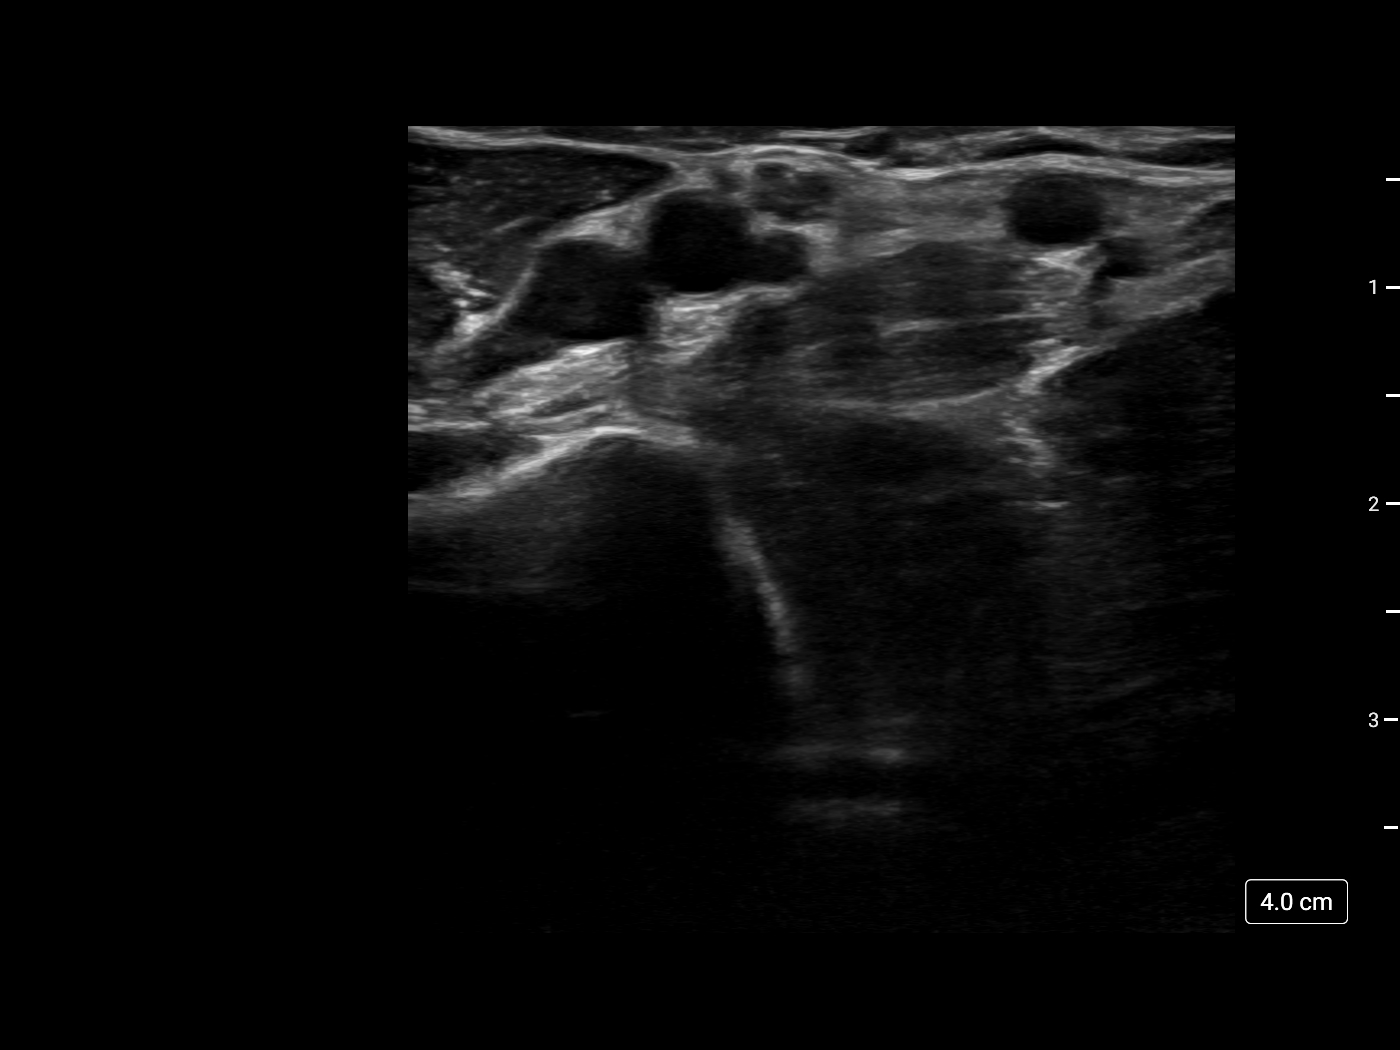
[im 2/3]
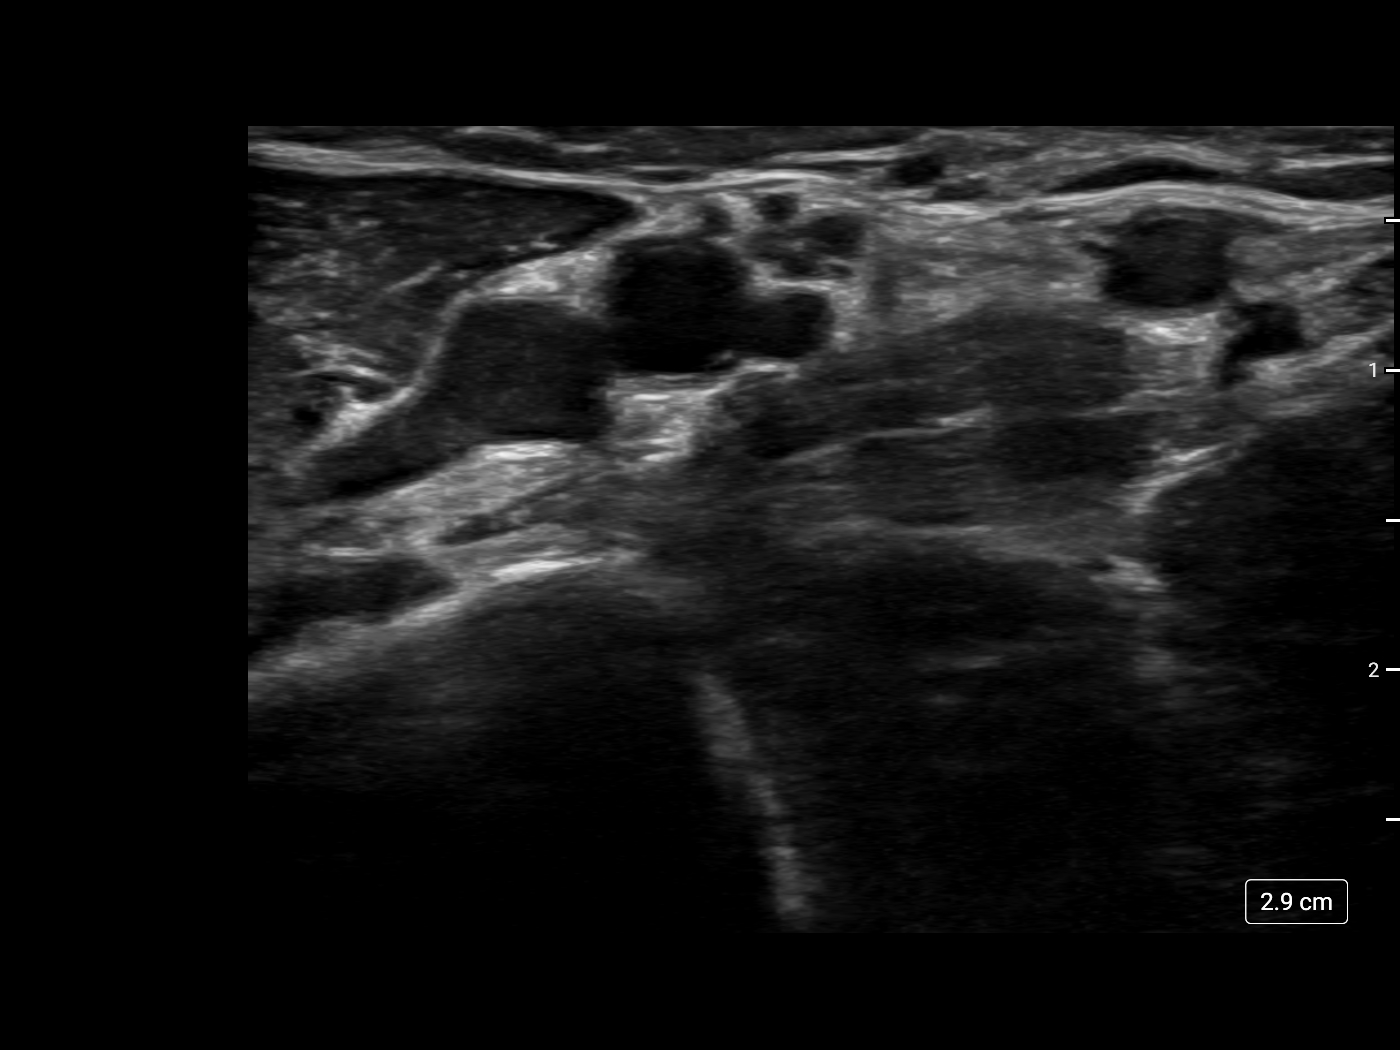
[im 3/3]
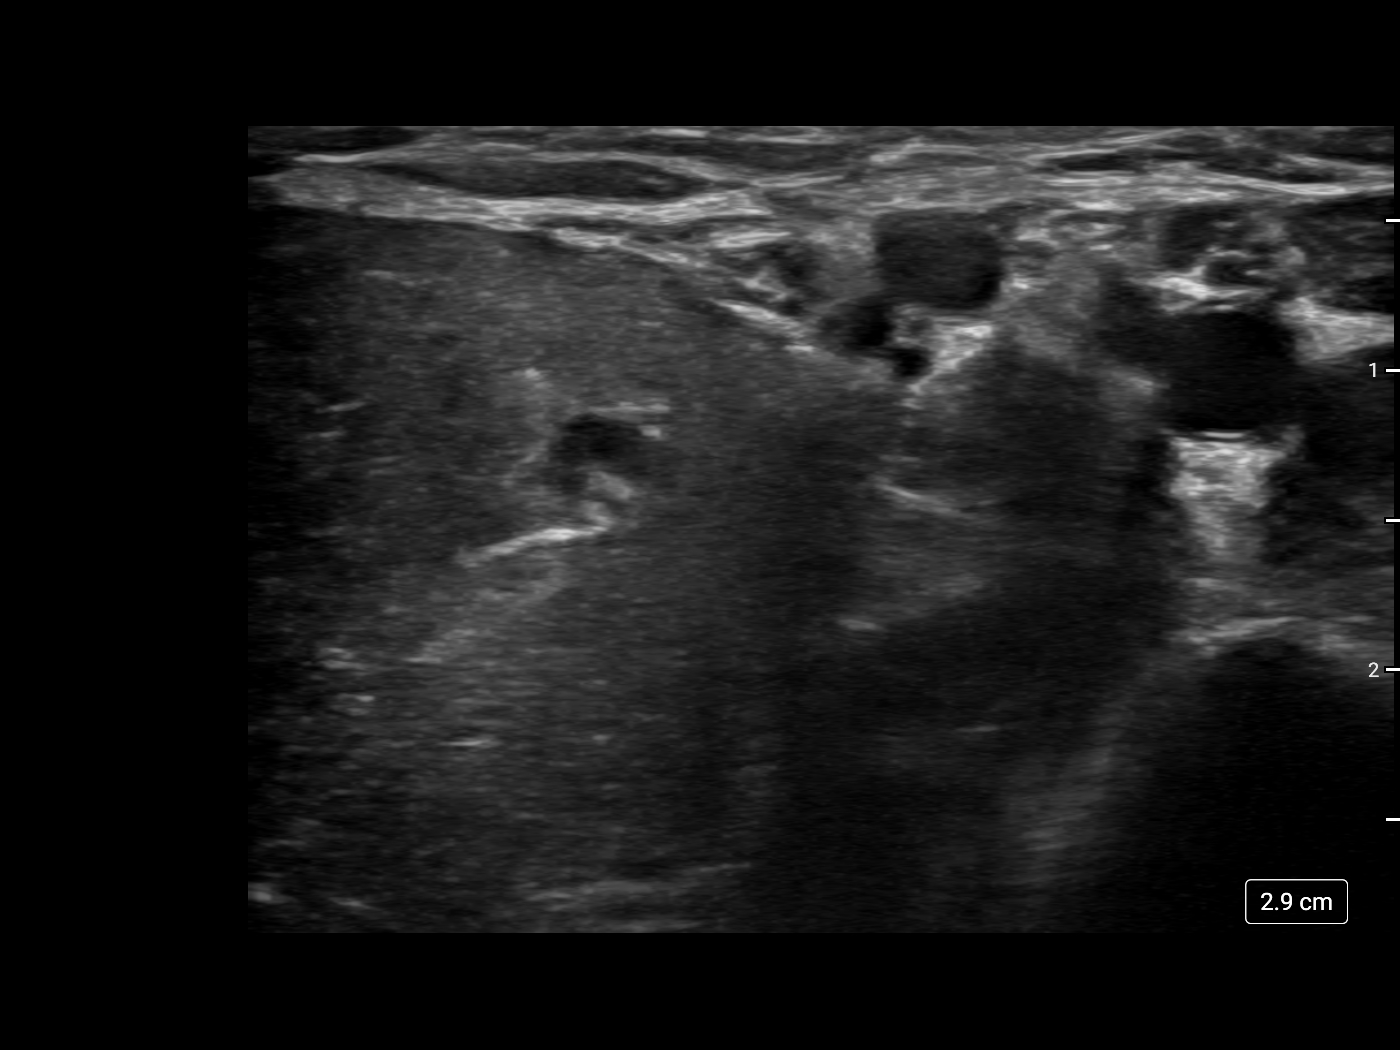

[Series 300: ir picc placement left >5 yrs inc img gu · 1 of 1 slices shown]
[im 1/1]
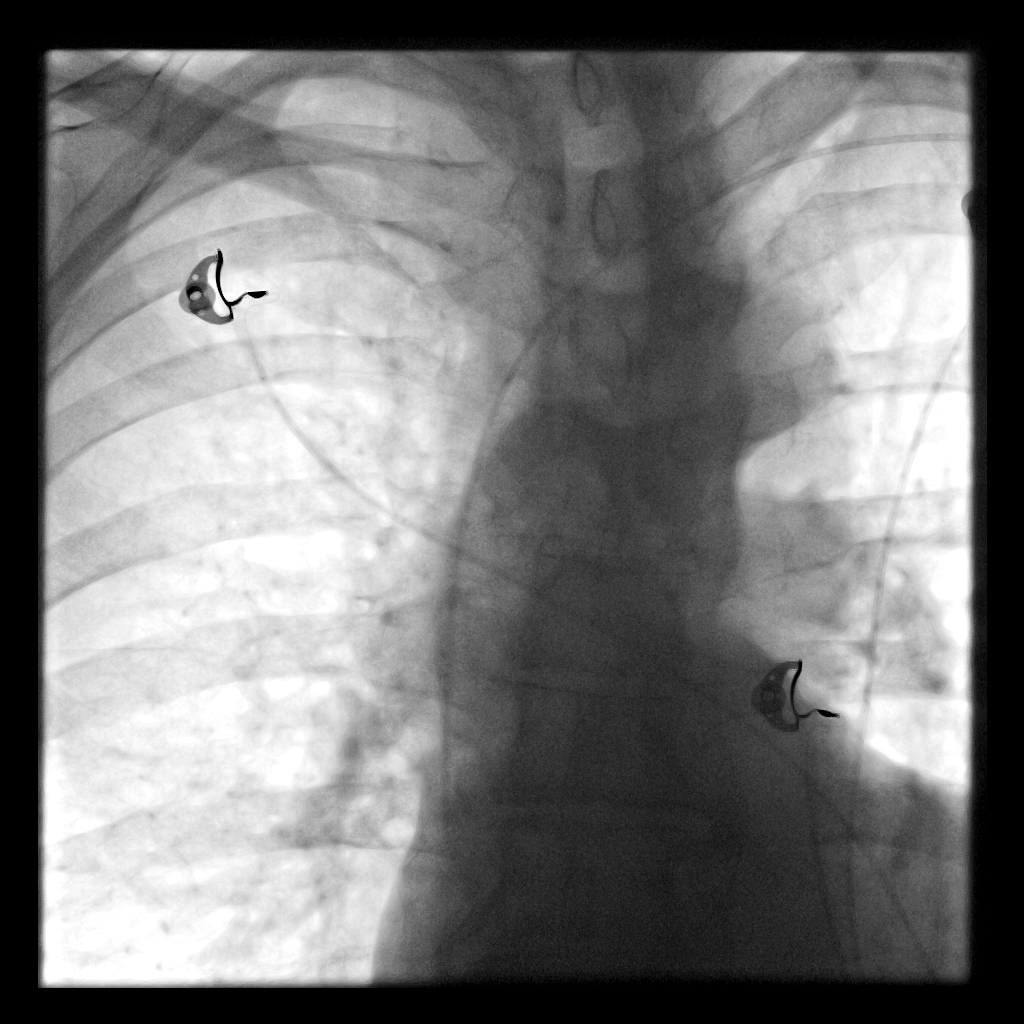

[4 of 4 positions shown; findings below may reference images not displayed]

EXAM:
LEFT UPPER EXTREMITY PICC LINE PLACEMENT WITH ULTRASOUND AND
FLUOROSCOPIC GUIDANCE

MEDICATIONS:
1% plain lidocaine, 2 mL;

ANESTHESIA/SEDATION:
Moderate Sedation Time: 0 minutes. The patient's level of
consciousness and vital signs were monitored continuously by
radiology nursing throughout the procedure under my direct
supervision.

FLUOROSCOPY TIME:  Fluoroscopy Time: 0 minutes 18 seconds (2 mGy).

COMPLICATIONS:
None immediate.

PROCEDURE:
The patient was advised of the possible risks and complications and
agreed to undergo the procedure. The patient was then brought to the
angiographic suite for the procedure.

The left arm was prepped with chlorhexidine, draped in the usual
sterile fashion using maximum barrier technique (cap and mask,
sterile gown, sterile gloves, large sterile sheet, hand hygiene and
cutaneous antisepsis) and infiltrated locally with 1% Lidocaine.

Ultrasound demonstrated patency of the left brachial vein, and this
was documented with an image. Under real-time ultrasound guidance,
this vein was accessed with a 21 gauge micropuncture needle and
image documentation was performed. A [DATE] wire was introduced in to
the vein. Over this, a 5 French dual lumen power injectable PICC was
advanced to the lower SVC/right atrial junction. Fluoroscopy during
the procedure and fluoro spot radiograph confirms appropriate
catheter position. The catheter was flushed and covered with a
sterile dressing.

Catheter length: 44 cm
IMPRESSION: Successful left arm power PICC line placement with ultrasound and
fluoroscopic guidance. The catheter is ready for use.

## 2020-03-29 MED ORDER — CEFAZOLIN SODIUM-DEXTROSE 2-4 GM/100ML-% IV SOLN: 2.0000 g | Freq: Three times a day (TID) | INTRAVENOUS | Status: DC

## 2020-03-29 MED ORDER — CEFAZOLIN SODIUM-DEXTROSE 2-4 GM/100ML-% IV SOLN
2.0000 g | Freq: Three times a day (TID) | INTRAVENOUS | Status: DC
  Filled 2020-03-29: qty 100

## 2020-03-29 MED ORDER — CEFAZOLIN IV (FOR PTA / DISCHARGE USE ONLY): 2.0000 g | Freq: Three times a day (TID) | INTRAVENOUS | 0 refills | Status: AC

## 2020-03-29 MED ORDER — CEFAZOLIN SODIUM-DEXTROSE 2-4 GM/100ML-% IV SOLN
2.0000 g | Freq: Three times a day (TID) | INTRAVENOUS | Status: DC
  Administered 2020-03-29: 2 g via INTRAVENOUS
  Filled 2020-03-29 (×2): qty 100

## 2020-03-29 MED ORDER — LIDOCAINE HCL 1 % IJ SOLN
INTRAMUSCULAR | Status: AC
  Administered 2020-03-29: 5 mL
  Filled 2020-03-29: qty 20

## 2020-03-29 NOTE — Progress Notes (Signed)
PHARMACY CONSULT NOTE FOR:  OUTPATIENT  PARENTERAL ANTIBIOTIC THERAPY (OPAT)  Indication: cefazolin 2g IV q8h Regimen: Osteomyelitis End date: 05/08/2020  IV antibiotic discharge orders are pended. To discharging provider:  please sign these orders via discharge navigator,  Select New Orders & click on the button choice - Manage This Unsigned Work.     Thank you for allowing pharmacy to be a part of this patient's care.  Thomas [name] 03/29/2020, 1:46 PM

## 2020-03-29 NOTE — Progress Notes (Signed)
Arrived at [facility] to place PICC, contacted Aldona Bar RN and she stated the pt is having PICC placed by IR. I advised that we were not notified of this order change, she apologized for miscommunication. [place]ina Pizza

## 2020-03-29 NOTE — Progress Notes (Signed)
Subjective: POD #3 s/p left foot surgery due to infections with Dr. March Rummage.  He states he is doing well but eager to leave the hospital.  He has no concerns today.  Denies any fevers, chills, nausea, vomiting.  No calf pain, chest pain, shortness of breath.  Objective: AAO x3, NAD No open lesions or pre-ulcerative lesions.  On the right foot incision is well coapted.  On the left foot there is no packing intact there is macerated tissue periwound.  No significant cellulitis or warmth of the foot.  There is no areas of fluctuance or crepitation. No pain with calf compression, swelling, warmth, erythema          Assessment: Status post bilateral foot surgeries due to infection  Plan: Case discussed with Dr. March Rummage.  At this point awaiting wound culture for ID recommendations for antibiotics.  Once this is complete he can be discharged with the appropriate antibiotics.  We will follow up in 1 week with Dr. March Rummage.  Continued surgical shoes.  [place].  Celesta Gentile, DPM

## 2020-03-29 NOTE — Procedures (Signed)
Successful placement of double lumen PICC line to left brachial vein. Length 44 cm Tip at lower SVC/RA PICC capped No complications Ready for use.  EBL < 5 mL   Ascencion Dike PA-C 03/29/2020 4:51 PM

## 2020-03-29 NOTE — Care Management Important Message (Signed)
Important Message  Patient Details IM Letter given to the Patient. Name: Thomas [name] MRN: [id number] Date of Birth: 06-13-1950   Medicare Important Message Given:  Yes     Kerin Salen 03/29/2020, 3:49 PM

## 2020-03-29 NOTE — TOC Transition Note (Addendum)
Transition of Care [facility]) - CM/SW Discharge Note   Patient Details  Name: Thomas [name] MRN: [id number] Date of Birth: 10-Sep-1950  Transition of [facility]) CM/SW Contact:  Ross Ludwig, LCSW Phone Number: 03/29/2020, 3:16 PM   Clinical Narrative:     CSW was informed that patient will need home IV antibiotics.  CSW spoke to [facility] from home infusion team, she is aware of referral.  CSW spoke to patient he is is agreeable to home IV antibiotics.  CSW asked patient if he had a preference for agencies, and he said no.  CSW contacted [facility] and asked if they can accept patient.  Alvis Lemmings is reviewing staffing and will call CSW back.  CSW to continue to follow patient's progress throughout discharge planning.  3:45pm CSW spoke to [place], they can not see patient till Wednesday.  CSW spoke to [place] they can see patient tomorrow.  CSW updated bedside nurse, and Pam from [facility] infusion team.  Pam will be completing teaching for patient, and [facility] is able to accept patient for home health RN.  Final next level of care: [place] Barriers to Discharge: Barriers Resolved   Patient Goals and CMS Choice Patient states their goals for this hospitalization and ongoing recovery are:: To go home with home health services. CMS Medicare.gov Compare Post Acute Care list provided to:: Patient Choice offered to / list presented to : Patient  Discharge Placement                       Discharge Plan and Services                          HH Arranged: RN [facility] Agency: [place] Date [facility] Agency Contacted: 03/29/20 Time [place]: 5003 Representative spoke with at [place]: Cindie  Social Determinants of Health ([place]) Interventions     Readmission Risk Interventions No flowsheet data found.

## 2020-03-29 NOTE — Progress Notes (Signed)
[place] [facility] for Infectious Disease   Reason for visit: Follow up on osteomyelitis  Interval History: culture with MSSA on right third toe (appears to be from bone biopsy), also positive from wound of left foot (? If bone biopsy as well).  He feels well otherwise and ready to be discharged.     Physical Exam: Constitutional:  Vitals:   03/29/20 1020 03/29/20 1429  BP: 115/65 126/73  Pulse: 65 63  Resp:  18  Temp:  98.4 F (36.9 C)  SpO2:  98%   patient appears in NAD Respiratory: Normal respiratory effort; CTA B Cardiovascular: RRR GI: soft, nt, nd  Review of Systems: Constitutional: negative for fevers and chills Gastrointestinal: negative for nausea and diarrhea Integument/breast: negative for rash  Lab Results  Component Value Date   WBC 5.1 03/28/2020   HGB 10.9 (L) 03/28/2020   HCT 35.7 (L) 03/28/2020   MCV 84.0 03/28/2020   PLT 279 03/28/2020    Lab Results  Component Value Date   CREATININE 0.94 03/28/2020   BUN 18 03/26/2020   NA 136 03/26/2020   K 3.9 03/26/2020   CL 99 03/26/2020   CO2 28 03/26/2020    Lab Results  Component Value Date   ALT 15 03/26/2020   AST 13 (L) 03/26/2020   ALKPHOS 89 03/26/2020     Microbiology: Recent Results (from the past 240 hour(s))  Culture, blood (routine x 2)     Status: None (Preliminary result)   Collection Time: 03/25/20  9:54 AM   Specimen: BLOOD  Result Value Ref Range Status   Specimen Description   Final    BLOOD LEFT ARM Performed at [facility], [place] [address] [place], [place] [address]    Special Requests   Final    BOTTLES DRAWN AEROBIC AND ANAEROBIC Blood Culture adequate volume Performed at [place] [address] [place], [place] [address]    Culture   Final    NO GROWTH 4 DAYS Performed at [facility], [place] [address] [place], [place] [address]    Report Status PENDING  Incomplete  Resp Panel by RT-PCR (Flu A&B, Covid)  Nasopharyngeal Swab     Status: None   Collection Time: 03/25/20  9:54 AM   Specimen: Nasopharyngeal Swab; Nasopharyngeal(NP) swabs in vial transport medium  Result Value Ref Range Status   SARS Coronavirus 2 by RT PCR NEGATIVE NEGATIVE Final    Comment: (NOTE) SARS-CoV-2 target nucleic acids are NOT DETECTED.  The SARS-CoV-2 RNA is generally detectable in upper respiratory specimens during the acute phase of infection. The lowest concentration of SARS-CoV-2 viral copies this assay can detect is 138 copies/mL. A negative result does not preclude SARS-Cov-2 infection and should not be used as the sole basis for treatment or other patient management decisions. A negative result may occur with  improper specimen collection/handling, submission of specimen other than nasopharyngeal swab, presence of viral mutation(s) within the areas targeted by this assay, and inadequate number of viral copies(<138 copies/mL). A negative result must be combined with clinical observations, patient history, and epidemiological information. The expected result is Negative.  Fact Sheet for Patients:  EntrepreneurPulse.com.au  Fact Sheet for Healthcare Providers:  IncredibleEmployment.be  This test is no t yet approved or cleared by the [place] FDA and  has been authorized for detection and/or diagnosis of SARS-CoV-2 by FDA under an Emergency Use Authorization (EUA). This EUA will remain  in effect (meaning this test  can be used) for the duration of the COVID-19 declaration under Section 564(b)(1) of the Act, 21 U.S.C.section 360bbb-3(b)(1), unless the authorization is terminated  or revoked sooner.       Influenza A by PCR NEGATIVE NEGATIVE Final   Influenza B by PCR NEGATIVE NEGATIVE Final    Comment: (NOTE) The Xpert Xpress SARS-CoV-2/FLU/RSV plus assay is intended as an aid in the diagnosis of influenza from Nasopharyngeal swab specimens and should not be  used as a sole basis for treatment. Nasal washings and aspirates are unacceptable for Xpert Xpress SARS-CoV-2/FLU/RSV testing.  Fact Sheet for Patients: EntrepreneurPulse.com.au  Fact Sheet for Healthcare Providers: IncredibleEmployment.be  This test is not yet approved or cleared by the [place] FDA and has been authorized for detection and/or diagnosis of SARS-CoV-2 by FDA under an Emergency Use Authorization (EUA). This EUA will remain in effect (meaning this test can be used) for the duration of the COVID-19 declaration under Section 564(b)(1) of the Act, 21 U.S.C. section 360bbb-3(b)(1), unless the authorization is terminated or revoked.  Performed at [facility], [place] [address] [place], [place] [address]   Culture, blood (routine x 2)     Status: None (Preliminary result)   Collection Time: 03/25/20  9:55 AM   Specimen: BLOOD  Result Value Ref Range Status   Specimen Description   Final    BLOOD RIGHT ARM Performed at [place] [address] [place], [place] [address]    Special Requests   Final    BOTTLES DRAWN AEROBIC AND ANAEROBIC Blood Culture results may not be optimal due to an inadequate volume of blood received in culture bottles Performed at [place] [address], [place], [place] [address]    Culture   Final    NO GROWTH 4 DAYS Performed at [facility], [place] [address] [place], [place] [address]    Report Status PENDING  Incomplete  MRSA PCR Screening     Status: None   Collection Time: 03/26/20  1:50 PM   Specimen: Nasopharyngeal  Result Value Ref Range Status   MRSA by PCR NEGATIVE NEGATIVE Final    Comment:        The GeneXpert MRSA Assay (FDA approved for NASAL specimens only), is one component of a comprehensive MRSA colonization surveillance program. It is not intended to diagnose MRSA infection nor to guide or monitor  treatment for MRSA infections. Performed at [facility], [place] [address], [place], [place] [address]   Aerobic/Anaerobic Culture (surgical/deep wound)     Status: None (Preliminary result)   Collection Time: 03/26/20  6:25 PM   Specimen: PATH Soft tissue  Result Value Ref Range Status   Specimen Description   Final    TISSUE RIGHT FIRST METATARSAL Performed at [place] [address], [place], [place] [address]    Special Requests   Final    NONE Performed at [facility], [place] [address], [place], [place] [address]    Gram Stain NO WBC SEEN NO ORGANISMS SEEN   Final   Culture   Final    NO GROWTH 3 DAYS NO ANAEROBES ISOLATED; CULTURE IN PROGRESS FOR 5 DAYS Performed at [place] [address] [place], [place] [address]    Report Status PENDING  Incomplete  Aerobic/Anaerobic Culture (surgical/deep wound)     Status: None (Preliminary result)   Collection Time: 03/26/20  6:29 PM   Specimen: PATH  Other; Tissue  Result Value Ref Range Status   Specimen Description   Final    TOE RIGHT THIRD TOE Performed at [place] [address] [place], [place] [address]    Special Requests   Final    NONE Performed at [facility], [place] [address] [place], [place] [address]    Gram Stain   Final    FEW WBC PRESENT, PREDOMINANTLY PMN FEW GRAM POSITIVE COCCI IN CLUSTERS Performed at [facility], [place] [address] [place], [place] [address]    Culture   Final    FEW STAPHYLOCOCCUS AUREUS NO ANAEROBES ISOLATED; CULTURE IN PROGRESS FOR 5 DAYS    Report Status PENDING  Incomplete   Organism ID, Bacteria STAPHYLOCOCCUS AUREUS  Final      Susceptibility   Staphylococcus aureus - MIC*    CIPROFLOXACIN <=0.5 SENSITIVE Sensitive     ERYTHROMYCIN <=0.25 SENSITIVE Sensitive     GENTAMICIN <=0.5 SENSITIVE Sensitive     OXACILLIN <=0.25 SENSITIVE Sensitive     TETRACYCLINE  <=1 SENSITIVE Sensitive     VANCOMYCIN 1 SENSITIVE Sensitive     TRIMETH/SULFA <=10 SENSITIVE Sensitive     CLINDAMYCIN <=0.25 SENSITIVE Sensitive     RIFAMPIN <=0.5 SENSITIVE Sensitive     Inducible Clindamycin NEGATIVE Sensitive     * FEW STAPHYLOCOCCUS AUREUS  Aerobic/Anaerobic Culture (surgical/deep wound)     Status: None (Preliminary result)   Collection Time: 03/26/20  6:42 PM   Specimen: PATH Other; Tissue  Result Value Ref Range Status   Specimen Description   Final    TISSUE LEFT FIFTH METATARSAL Performed at [employer] [address], [place], [place] [address]    Special Requests   Final    NONE Performed at [facility], [place] [address], [place], [place] [address]    Gram Stain NO WBC SEEN NO ORGANISMS SEEN   Final   Culture   Final    NO GROWTH 3 DAYS NO ANAEROBES ISOLATED; CULTURE IN PROGRESS FOR 5 DAYS Performed at [place] [address], [place], [place] [address]    Report Status PENDING  Incomplete  Aerobic/Anaerobic Culture (surgical/deep wound)     Status: None (Preliminary result)   Collection Time: 03/26/20  6:55 PM   Specimen: Wound  Result Value Ref Range Status   Specimen Description   Final    WOUND FOOT LEFT Performed at [place] [address] [place] [address], [place], [place] [address]    Special Requests   Final    NONE Performed at [facility], [place] [address], [place], [place] [address]    Gram Stain   Final    MODERATE WBC PRESENT, PREDOMINANTLY PMN FEW GRAM POSITIVE COCCI IN CLUSTERS Performed at [facility], [place] [address] [place], [place] [address]    Culture   Final    FEW STAPHYLOCOCCUS AUREUS NO ANAEROBES ISOLATED; CULTURE IN PROGRESS FOR 5 DAYS    Report Status PENDING  Incomplete   Organism ID, Bacteria STAPHYLOCOCCUS AUREUS  Final      Susceptibility   Staphylococcus aureus - MIC*    CIPROFLOXACIN <=0.5 SENSITIVE Sensitive      ERYTHROMYCIN <=0.25 SENSITIVE Sensitive     GENTAMICIN <=0.5 SENSITIVE Sensitive     OXACILLIN 0.5 SENSITIVE Sensitive     TETRACYCLINE <=1 SENSITIVE Sensitive     VANCOMYCIN <=0.5 SENSITIVE Sensitive     TRIMETH/SULFA <=10 SENSITIVE Sensitive  CLINDAMYCIN <=0.25 SENSITIVE Sensitive     RIFAMPIN <=0.5 SENSITIVE Sensitive     Inducible Clindamycin NEGATIVE Sensitive     * FEW STAPHYLOCOCCUS AUREUS  Aerobic/Anaerobic Culture (surgical/deep wound)     Status: None (Preliminary result)   Collection Time: 03/26/20  7:05 PM   Specimen: Wound; Tissue  Result Value Ref Range Status   Specimen Description   Final    WOUND LEFT 2ND METATARSAL Performed at [place] [address], [place], [place] [address]    Special Requests   Final    NONE Performed at [facility], [place] [address], [place], [place] [address]    Gram Stain NO WBC SEEN NO ORGANISMS SEEN   Final   Culture   Final    NO GROWTH 3 DAYS NO ANAEROBES ISOLATED; CULTURE IN PROGRESS FOR 5 DAYS Performed at [place] [address] [place], [place] [address]    Report Status PENDING  Incomplete    Impression/Plan:  1. Osteomyelitis left and right toes with MSSA - I discussed treatment options with the patient and preference for IV therapy for osteomyelitis.  He is in agreement and will change to IV cefazolin for 6 weeks through 05/08/20.   Picc line has been ordered by IR since no picc team available today.   2.  Access - will order picc line as above.  I appreciate IR getting the patient in quickly (patient is currently there now at time of writing).   3.  Medication management - weekly labs per home health  Diagnosis: osteomyelitis  Culture Result: MSSA  Allergies  Allergen Reactions  . Chlorthalidone Other (See Comments)    "Makes me light-headed and I don't like the way it makes me feel"  . Voltaren [Diclofenac] Rash    OPAT Orders Discharge antibiotics to  be given via PICC line Discharge antibiotics:cefazolin  Per pharmacy protocol yes Duration: 6 weeks End Date: 05/08/20  [facility] Care Per Protocol: yes  Home health RN for IV administration and teaching; PICC line care and labs.    Labs weekly while on IV antibiotics: _x_ CBC with differential __ BMP _x_ CMP _x_ CRP _x_ ESR __ Vancomycin trough __ CK  _x_ Please pull PIC at completion of IV antibiotics __ Please leave PIC in place until doctor has seen patient or been notified  Fax weekly labs to [phone]  Clinic Follow Up Appt: 05/04/20  @ 2:45 pm with Dr. [place]

## 2020-03-29 NOTE — Discharge Summary (Signed)
Physician Discharge Summary  Thomas [name] YIF:[id number] DOB: August 02, 1950 DOA: 03/25/2020  PCP: [name], MD  Admit date: 03/25/2020 Discharge date: 03/29/2020  Admitted From:Home. Disposition:HHC.  Recommendations for Outpatient Follow-up:  1. Follow up with PCP in 1-2 weeks. 2. Please obtain BMP/CBC in one week.  Home Health:Yes  Equipment/Devices:None  Discharge Condition: Stable Code Status:   Code Status: DNR Diet recommendation:  Diet Order            Diet - low sodium heart healthy           Diet Heart Room service appropriate? Yes; Fluid consistency: Thin  Diet effective now                 Brief/Interim Summary: Brief Narrative: 69yo male with history of HFrEF, A Flutter,Hypertension presented with bilateral foot wounds, he had initially developed ulcer on his third digit of his right foot denied any injury.  He was seen in the podiatry office and sent to the ED for MRI and IV antibiotics and possible amputation. He underwent 1) Right first metatarsal bone biopsy,2) Right third toe amputation PIPJ 3) Left fifth metatarsal bone biopsy 4) Left foot wound excision and closure 5) Left second metatarsal incision and drainage below the deep fascia 6) Left second metatarsectomy. Seen by ID, managed with IV Antibiotics. Post op doing well.  Wound culture grew Staphylococcus on the left, seen by ID plan is for IV Ancef as per Dr. Dwyane Dee Is medically stable for discharge. Podiatry has cleared the patient for home.  Discharge Diagnoses:  Assessment & Plan:  MSSA Osteomyelitis of the foot bilaterally, right side first metatarsal, distal phalanx of third toe, small to level cellulitis, left foot with second metatarsal with surrounding abscess, fifth metatarsal: MRI reviewed see report.  Seen by Dr. March Rummage and underwent multiple procedures on his bilateral foot and toes, see procedure note as below on 12/3, follow-up Intra-Op wound culture,  Culture grew MSSA Discussed  with Dr Linus Salmons. Plan is to continue IV antibiotics Ancef  with PICC line and okay to discharge home.  Dr. Linus Salmons will follow this culture sensitivity and weekly labs.  Cont wound care.  A flutter, on Eliquis, in NSR,  Cont Eliquis,amiodarone.  HFrEF/NICM last echo EF 15%, euvolemic continue torsemide.Cardiac cath 02/06/2028 w/ no significant coronary artery disease.  HLD on statin  Normocytic anemia monitor  Rheumatoid arthritis/polyarthropathy/chronic pain/opiate dependence on Percocet 10:Continue pain control with oral opiates and IV opiates for severe pain.  Continue home prednisone 10 mg.    Consults:  ID, podiatry   Subjective: Pain controlled, he anxious to leave for home today.  Waiting for PICC line.  Discharge Exam: Vitals:   03/29/20 0526 03/29/20 1020  BP: 128/75 115/65  Pulse: (!) 57 65  Resp: 16   Temp: 98.3 F (36.8 C)   SpO2: 99%    General: Pt is alert, awake, not in acute distress Cardiovascular: RRR, S1/S2 +, no rubs, no gallops Respiratory: CTA bilaterally, no wheezing, no rhonchi Abdominal: Soft, NT, ND, bowel sounds + Extremities: no edema, no cyanosis Dressing intact bilateral lower extremities  Discharge Instructions  Discharge Instructions    [facility] pharmacist to adjust dose for Vancomycin, Aminoglycosides and other anti-infective therapies as requested by physician.   Complete by: As directed    [facility] to provide Cath Flo 49m   Complete by: As directed    Administer for PICC line occlusion and as ordered by physician for other access device  issues.   Anaphylaxis Kit: Provided to treat any anaphylactic reaction to the medication being provided to the patient if First Dose or when requested by physician   Complete by: As directed    Epinephrine 52m/ml vial / amp: Administer 0.3246m(0.46m11msubcutaneously once for moderate to severe anaphylaxis, nurse to call physician and pharmacy when reaction occurs and call 911 if  needed for immediate care   Diphenhydramine 62m32m IV vial: Administer 25-62mg20mIM PRN for first dose reaction, rash, itching, mild reaction, nurse to call physician and pharmacy when reaction occurs   Sodium Chloride 0.9% NS 500ml 51mAdminister if needed for hypovolemic blood pressure drop or as ordered by physician after call to physician with anaphylactic reaction   Change dressing on IV access line weekly and PRN   Complete by: As directed    Diet - low sodium heart healthy   Complete by: As directed    Discharge instructions   Complete by: As directed    Please call call MD or return to ER for similar or worsening recurring problem that brought you to hospital or if any fever,nausea/vomiting,abdominal pain, uncontrolled pain, chest pain,  shortness of breath or any other alarming symptoms.  Please follow-up your doctor as instructed in a week time and call the office for appointment.  Please avoid alcohol, smoking, or any other illicit substance and maintain healthy habits including taking your regular medications as prescribed.  You were cared for by a hospitalist during your hospital stay. If you have any questions about your discharge medications or the care you received while you were in the hospital after you are discharged, you can call the unit and ask to speak with the hospitalist on call if the hospitalist that took care of you is not available.  Once you are discharged, your primary care physician will handle any further medical issues. Please note that NO REFILLS for any discharge medications will be authorized once you are discharged, as it is imperative that you return to your primary care physician (or establish a relationship with a primary care physician if you do not have one) for your aftercare needs so that they can reassess your need for medications and monitor your lab values   Discharge wound care:   Complete by: As directed    Right foot: Non-stick dressing  (Adaptic/xeroform), 4x4, kerlix, ACE bandage. Change qOD. Left foot: flush wound 2nd metatarsal area with saline flush. Pack gently with iodoform packing daily. Cover with non-stick dressing, 4x4, kerlix, ACE   Face-to-face encounter (required for Medicare/Medicaid patients)   Complete by: As directed    I RameshAntonieta Pertfy that this patient is under my care and that I, or a nurse practitioner or physician's assistant working with me, had a face-to-face encounter that meets the physician face-to-face encounter requirements with this patient on 03/29/2020. The encounter with the patient was in whole, or in part for the following medical condition(s) which is the primary reason for home health care (List medical condition): foot wound, foot pain   The encounter with the patient was in whole, or in part, for the following medical condition, which is the primary reason for home health care: foot wound   I certify that, based on my findings, the following services are medically necessary home health services: Nursing   Reason for Medically Necessary Home Health Services: Skilled Nursing- Complex Wound Care   My clinical findings support the need for the above services: Pain interferes with ambulation/mobility  Further, I certify that my clinical findings support that this patient is homebound due to: Pain interferes with ambulation/mobility   Flush IV access with Sodium Chloride 0.9% and Heparin 10 units/ml or 100 units/ml   Complete by: As directed    Home Health   Complete by: As directed    To provide the following care/treatments: RN   Home infusion instructions - [facility]   Complete by: As directed    Instructions: Flush IV access with Sodium Chloride 0.9% and Heparin 10units/ml or 100units/ml   Change dressing on IV access line: Weekly and PRN   Instructions Cath Flo 62m: Administer for PICC Line occlusion and as ordered by physician for other access device   [facility]  pharmacist to adjust dose for: Vancomycin, Aminoglycosides and other anti-infective therapies as requested by physician   Increase activity slowly   Complete by: As directed    Method of administration may be changed at the discretion of home infusion pharmacist based upon assessment of the patient and/or caregiver's ability to self-administer the medication ordered   Complete by: As directed    Outpatient Parenteral Antibiotic Therapy Information Antibiotic: Cefazolin (Ancef) IVPB; Indications for use: osteomyelitis; End Date: 05/08/2020   Complete by: As directed    Antibiotic: Cefazolin (Ancef) IVPB   Indications for use: osteomyelitis   End Date: 05/08/2020     Allergies as of 03/29/2020      Reactions   Chlorthalidone Other (See Comments)   "Makes me light-headed and I don't like the way it makes me feel"   Voltaren [diclofenac] Rash      Medication List    TAKE these medications   amiodarone 200 MG tablet Commonly known as: PACERONE Take 1 tablet (200 mg total) by mouth 2 (two) times daily.   apixaban 5 MG Tabs tablet Commonly known as: ELIQUIS Take 1 tablet (5 mg total) by mouth 2 (two) times daily.   atorvastatin 40 MG tablet Commonly known as: LIPITOR Take 1 tablet (40 mg total) by mouth daily.   ceFAZolin  IVPB Commonly known as: ANCEF Inject 2 g into the vein every 8 (eight) hours. Indication:  Osteomyelitis First Dose: No Last Day of Therapy:  05/08/2020 Labs - Once weekly:  CBC/D and BMP, Labs - Every other week:  ESR and CRP Method of administration: IV Push Method of administration may be changed at the discretion of home infusion pharmacist based upon assessment of the patient and/or caregiver's ability to self-administer the medication ordered.   DULoxetine 60 MG capsule Commonly known as: CYMBALTA Take 1 capsule (60 mg total) by mouth daily.   Enbrel SureClick 50 MG/ML injection Generic drug: etanercept Inject 25 mg into the skin once a week.    Klor-Con M20 20 MEQ tablet Generic drug: potassium chloride SA TAKE 2 TABLETS BY MOUTH 2 TIMES DAILY. What changed: how much to take   Melatonin 10 MG Tabs Take 10 mg by mouth at bedtime.   methotrexate 2.5 MG tablet Commonly known as: RHEUMATREX Take 20 mg by mouth once a week.   Narcan 4 MG/0.1ML Liqd nasal spray kit Generic drug: naloxone Place 1 spray into the nose as needed (as directed for emergency). What changed: when to take this   oxyCODONE-acetaminophen 10-325 MG tablet Commonly known as: PERCOCET Take 1 tablet by mouth every 4 (four) hours.   predniSONE 10 MG tablet Commonly known as: DELTASONE Take 10 mg by mouth daily with breakfast.   tamsulosin 0.4 MG Caps  capsule Commonly known as: FLOMAX Take 0.4 mg by mouth daily.   torsemide 20 MG tablet Commonly known as: DEMADEX Take 1 tablet (20 mg total) by mouth 2 (two) times daily.            Discharge Care Instructions  (From admission, onward)         Start     Ordered   03/29/20 0000  Change dressing on IV access line weekly and PRN  (Home infusion instructions - [facility] )        03/29/20 1403   03/29/20 0000  Discharge wound care:       Comments: Right foot: Non-stick dressing (Adaptic/xeroform), 4x4, kerlix, ACE bandage. Change qOD. Left foot: flush wound 2nd metatarsal area with saline flush. Pack gently with iodoform packing daily. Cover with non-stick dressing, 4x4, kerlix, ACE   03/29/20 1403          Follow-up Information    [name], MD Follow up.   Specialty: Family Medicine Contact information: [place] [address] [phone]        Freada Bergeron, MD .   Specialties: Cardiology, Radiology Contact information: [address] [place] [address] [phone]        Rosiland Oz, MD Follow up in 1 week(s).   Specialty: Infectious Diseases Contact information: [place] [address]  [place] [address] [phone]              Allergies  Allergen Reactions  . Chlorthalidone Other (See Comments)    "Makes me light-headed and I don't like the way it makes me feel"  . Voltaren [Diclofenac] Rash    The results of significant diagnostics from this hospitalization (including imaging, microbiology, ancillary and laboratory) are listed below for reference.    Microbiology: Recent Results (from the past 240 hour(s))  Culture, blood (routine x 2)     Status: None (Preliminary result)   Collection Time: 03/25/20  9:54 AM   Specimen: BLOOD  Result Value Ref Range Status   Specimen Description   Final    BLOOD LEFT ARM Performed at [facility] [address], [place], [place] [address]    Special Requests   Final    BOTTLES DRAWN AEROBIC AND ANAEROBIC Blood Culture adequate volume Performed at [facility] [address] [place], [place] [address]    Culture   Final    NO GROWTH 4 DAYS Performed at [facility], [place] [address], [place], [place] [address]    Report Status PENDING  Incomplete  Resp Panel by RT-PCR (Flu A&B, Covid) Nasopharyngeal Swab     Status: None   Collection Time: 03/25/20  9:54 AM   Specimen: Nasopharyngeal Swab; Nasopharyngeal(NP) swabs in vial transport medium  Result Value Ref Range Status   SARS Coronavirus 2 by RT PCR NEGATIVE NEGATIVE Final    Comment: (NOTE) SARS-CoV-2 target nucleic acids are NOT DETECTED.  The SARS-CoV-2 RNA is generally detectable in upper respiratory specimens during the acute phase of infection. The lowest concentration of SARS-CoV-2 viral copies this assay can detect is 138 copies/mL. A negative result does not preclude SARS-Cov-2 infection and should not be used as the sole basis for treatment or other patient management decisions. A negative result may occur with  improper specimen collection/handling, submission of specimen other than nasopharyngeal  swab, presence of viral mutation(s) within the areas targeted by this assay, and inadequate number  of viral copies(<138 copies/mL). A negative result must be combined with clinical observations, patient history, and epidemiological information. The expected result is Negative.  Fact Sheet for Patients:  EntrepreneurPulse.com.au  Fact Sheet for Healthcare Providers:  IncredibleEmployment.be  This test is no t yet approved or cleared by the [place] FDA and  has been authorized for detection and/or diagnosis of SARS-CoV-2 by FDA under an Emergency Use Authorization (EUA). This EUA will remain  in effect (meaning this test can be used) for the duration of the COVID-19 declaration under Section 564(b)(1) of the Act, 21 U.S.C.section 360bbb-3(b)(1), unless the authorization is terminated  or revoked sooner.       Influenza A by PCR NEGATIVE NEGATIVE Final   Influenza B by PCR NEGATIVE NEGATIVE Final    Comment: (NOTE) The Xpert Xpress SARS-CoV-2/FLU/RSV plus assay is intended as an aid in the diagnosis of influenza from Nasopharyngeal swab specimens and should not be used as a sole basis for treatment. Nasal washings and aspirates are unacceptable for Xpert Xpress SARS-CoV-2/FLU/RSV testing.  Fact Sheet for Patients: EntrepreneurPulse.com.au  Fact Sheet for Healthcare Providers: IncredibleEmployment.be  This test is not yet approved or cleared by the [place] FDA and has been authorized for detection and/or diagnosis of SARS-CoV-2 by FDA under an Emergency Use Authorization (EUA). This EUA will remain in effect (meaning this test can be used) for the duration of the COVID-19 declaration under Section 564(b)(1) of the Act, 21 U.S.C. section 360bbb-3(b)(1), unless the authorization is terminated or revoked.  Performed at [facility], [place] [address], [place], [place] [address]    Culture, blood (routine x 2)     Status: None (Preliminary result)   Collection Time: 03/25/20  9:55 AM   Specimen: BLOOD  Result Value Ref Range Status   Specimen Description   Final    BLOOD RIGHT ARM Performed at [facility] [address], [place], [place] [address]    Special Requests   Final    BOTTLES DRAWN AEROBIC AND ANAEROBIC Blood Culture results may not be optimal due to an inadequate volume of blood received in culture bottles Performed at [facility] [address], [place], [place] [address]    Culture   Final    NO GROWTH 4 DAYS Performed at [facility], [place] [address] [place], [place] [address]    Report Status PENDING  Incomplete  MRSA PCR Screening     Status: None   Collection Time: 03/26/20  1:50 PM   Specimen: Nasopharyngeal  Result Value Ref Range Status   MRSA by PCR NEGATIVE NEGATIVE Final    Comment:        The GeneXpert MRSA Assay (FDA approved for NASAL specimens only), is one component of a comprehensive MRSA colonization surveillance program. It is not intended to diagnose MRSA infection nor to guide or monitor treatment for MRSA infections. Performed at [facility], [place] [address], [place], [place] [address]   Aerobic/Anaerobic Culture (surgical/deep wound)     Status: None (Preliminary result)   Collection Time: 03/26/20  6:25 PM   Specimen: PATH Soft tissue  Result Value Ref Range Status   Specimen Description   Final    TISSUE RIGHT FIRST METATARSAL Performed at [facility] [address] [place], [place] [address]    Special Requests   Final    NONE Performed at [facility], [place] [address], [place], [place] [address]  Gram Stain NO WBC SEEN NO ORGANISMS SEEN   Final   Culture   Final    NO GROWTH 3 DAYS NO ANAEROBES ISOLATED; CULTURE IN PROGRESS FOR 5 DAYS Performed at [facility], [place] [address],  [place], [place] [address]    Report Status PENDING  Incomplete  Aerobic/Anaerobic Culture (surgical/deep wound)     Status: None (Preliminary result)   Collection Time: 03/26/20  6:29 PM   Specimen: PATH Other; Tissue  Result Value Ref Range Status   Specimen Description   Final    TOE RIGHT THIRD TOE Performed at [facility] [address] [place], [place] [address]    Special Requests   Final    NONE Performed at [facility], [place] [address], [place], [place] [address]    Gram Stain   Final    FEW WBC PRESENT, PREDOMINANTLY PMN FEW GRAM POSITIVE COCCI IN CLUSTERS Performed at [facility], [place] [address], [place], [place] [address]    Culture   Final    FEW STAPHYLOCOCCUS AUREUS NO ANAEROBES ISOLATED; CULTURE IN PROGRESS FOR 5 DAYS    Report Status PENDING  Incomplete   Organism ID, Bacteria STAPHYLOCOCCUS AUREUS  Final      Susceptibility   Staphylococcus aureus - MIC*    CIPROFLOXACIN <=0.5 SENSITIVE Sensitive     ERYTHROMYCIN <=0.25 SENSITIVE Sensitive     GENTAMICIN <=0.5 SENSITIVE Sensitive     OXACILLIN <=0.25 SENSITIVE Sensitive     TETRACYCLINE <=1 SENSITIVE Sensitive     VANCOMYCIN 1 SENSITIVE Sensitive     TRIMETH/SULFA <=10 SENSITIVE Sensitive     CLINDAMYCIN <=0.25 SENSITIVE Sensitive     RIFAMPIN <=0.5 SENSITIVE Sensitive     Inducible Clindamycin NEGATIVE Sensitive     * FEW STAPHYLOCOCCUS AUREUS  Aerobic/Anaerobic Culture (surgical/deep wound)     Status: None (Preliminary result)   Collection Time: 03/26/20  6:42 PM   Specimen: PATH Other; Tissue  Result Value Ref Range Status   Specimen Description   Final    TISSUE LEFT FIFTH METATARSAL Performed at [facility] [address], [place], [place] [address]    Special Requests   Final    NONE Performed at [facility], [place] [address], [place], [place] [address]    Gram Stain NO WBC SEEN NO ORGANISMS SEEN   Final    Culture   Final    NO GROWTH 3 DAYS NO ANAEROBES ISOLATED; CULTURE IN PROGRESS FOR 5 DAYS Performed at [facility] [address], [place], [place] [address]    Report Status PENDING  Incomplete  Aerobic/Anaerobic Culture (surgical/deep wound)     Status: None (Preliminary result)   Collection Time: 03/26/20  6:55 PM   Specimen: Wound  Result Value Ref Range Status   Specimen Description   Final    WOUND FOOT LEFT Performed at [facility] [address], [place], [place] [address]    Special Requests   Final    NONE Performed at [facility], [place] [address], [place], [place] [address]    Gram Stain   Final    MODERATE WBC PRESENT, PREDOMINANTLY PMN FEW GRAM POSITIVE COCCI IN CLUSTERS Performed at [facility], [place] [address], [place],  [address]    Culture   Final    FEW STAPHYLOCOCCUS AUREUS NO ANAEROBES ISOLATED; CULTURE IN PROGRESS FOR 5 DAYS    Report Status  PENDING  Incomplete   Organism ID, Bacteria STAPHYLOCOCCUS AUREUS  Final      Susceptibility   Staphylococcus aureus - MIC*    CIPROFLOXACIN <=0.5 SENSITIVE Sensitive     ERYTHROMYCIN <=0.25 SENSITIVE Sensitive     GENTAMICIN <=0.5 SENSITIVE Sensitive     OXACILLIN 0.5 SENSITIVE Sensitive     TETRACYCLINE <=1 SENSITIVE Sensitive     VANCOMYCIN <=0.5 SENSITIVE Sensitive     TRIMETH/SULFA <=10 SENSITIVE Sensitive     CLINDAMYCIN <=0.25 SENSITIVE Sensitive     RIFAMPIN <=0.5 SENSITIVE Sensitive     Inducible Clindamycin NEGATIVE Sensitive     * FEW STAPHYLOCOCCUS AUREUS  Aerobic/Anaerobic Culture (surgical/deep wound)     Status: None (Preliminary result)   Collection Time: 03/26/20  7:05 PM   Specimen: Wound; Tissue  Result Value Ref Range Status   Specimen Description   Final    WOUND LEFT 2ND METATARSAL Performed at [facility] [address], [place], [place] [address]    Special Requests   Final    NONE Performed at [facility], [place] [address], [place], [place] [address]    Gram Stain NO WBC SEEN NO ORGANISMS SEEN   Final   Culture   Final    NO GROWTH 3 DAYS NO ANAEROBES ISOLATED; CULTURE IN PROGRESS FOR 5 DAYS Performed at [facility] [address] [place], [place] [address]    Report Status PENDING  Incomplete    Procedures/Studies: MR FOOT RIGHT W WO CONTRAST  Result Date: 03/25/2020 CLINICAL DATA:  Diabetes, foot swelling. Prior amputation of the great toe. Ulcer along the third toe. EXAM: MRI OF THE RIGHT FOREFOOT WITHOUT AND WITH CONTRAST TECHNIQUE: Multiplanar, multisequence MR imaging of the right forefoot was performed before and after the administration of intravenous contrast. CONTRAST:  42m GADAVIST GADOBUTROL 1 MMOL/ML IV SOLN COMPARISON:  Multiple exams, including radiographs from 03/03/2020 FINDINGS: Bones/Joint/Cartilage Absent great toe. 1.3 by 1.7 cm erosion along the plantar head of the first metatarsal with a notable surrounding edema in the metatarsal head and distal shaft, suspicious for active osteomyelitis for example on image 26 of series 7. Nonvisualization of the medial first digit sesamoid, probably eroded. There is edema in the lateral sesamoid of the first digit. Prominent edema and enhancement in the distal phalanx of the third toe. Possible midshaft fracture versus bony destruction. Surrounding subcutaneous edema and enhancement in the third toe. Mild deformity of the distal metaphysis of the proximal phalanx third toe likely from an old fracture. Low-grade marrow edema is present in the head of the second metatarsal. Abnormal marrow edema and enhancement is present in the proximal, middle, and distal phalanges of the small toe with associated subcutaneous edema and enhancement. Possible fracture involving the distal head of the proximal phalanx of the small toe. Appearance concerning for osteomyelitis of the small toe. Ligaments Lisfranc ligament intact.  Muscles and Tendons Diffuse low-grade edema and enhancement in the visualized musculature of the forefoot, potentially from myositis or neurogenic edema. Thickening of the detached flexor hallucis longus tendon. Soft tissues Extensive edema and enhancement tracking in the dorsum of the foot and in the remaining toes, favoring cellulitis. I not observe a drainable abscess. There is some hypoenhancement in the plantar tissues along the ball of the foot below the second and third MTP joints, without visualized gas in the soft tissues, correlate with visual inspection in assessing for tissue necrosis. IMPRESSION: 1. Osteomyelitis of the head and distal shaft  of the first metatarsal. 2. Prominent edema and enhancement in the distal phalanx of the third toe, favoring osteomyelitis, with possible midshaft fracture versus bony destruction. 3. Abnormal marrow edema and enhancement in the proximal, middle, and distal phalanges of the small toe, with associated subcutaneous edema and enhancement. Appearance favors osteomyelitis. Possible fracture involving the distal head of the proximal phalanx of the small toe. 4. Extensive edema and enhancement in the dorsum of the foot and in the remaining toes, favoring cellulitis. No drainable abscess. 5. Diffuse low-grade edema and enhancement in the visualized musculature of the forefoot, potentially from myositis or neurogenic edema. 6. Thickening of the detached flexor hallucis longus tendon, compatible with prior amputation. 7. There is some hypoenhancement in the plantar tissues along the ball of the foot below the second and third MTP joints, correlate with visual inspection in assessing for tissue necrosis. 8. Low-grade edema signal and enhancement in the head of the second metatarsal and adjacent base of the proximal phalanx second toe, possibly degenerative or reactive. Electronically Signed   By: Van Clines M.D.   On: 03/25/2020 14:36   MR FOOT LEFT W WO  CONTRAST  Result Date: 03/25/2020 CLINICAL DATA:  Left foot pain and swelling.  Diabetic foot ulcers. EXAM: MRI OF THE LEFT FOREFOOT WITHOUT AND WITH CONTRAST TECHNIQUE: Multiplanar, multisequence MR imaging of the left foot was performed both before and after administration of intravenous contrast. CONTRAST:  68m GADAVIST GADOBUTROL 1 MMOL/ML IV SOLN COMPARISON:  9 cc Gadavist FINDINGS: Diffuse abnormal T1 and T2 signal intensity in the second metatarsal shaft and neck highly suspicious for osteomyelitis with an associated pathologic fracture through the metacarpal neck region. Marked surrounding soft tissue swelling/edema and rim enhancing fluid containing gas, consistent with an abscess. There is also a small open wound along the lateral aspect of the forefoot overlying the region of the fifth metatarsal head. There is associated abnormal T1 and T2 signal intensity in the fifth metacarpal head and subsequent contrast enhancement consistent with osteomyelitis. I do not see any definite findings for septic arthritis at the fifth MTP joint. Diffuse and fairly marked subcutaneous soft tissue swelling/edema/fluid consistent with cellulitis. No subcutaneous abscesses are identified. There is also diffuse myofasciitis without definite findings for pyomyositis. IMPRESSION: 1. Findings consistent with osteomyelitis involving the second metatarsal shaft and neck with an associated pathologic fracture through the metacarpal neck region. There is a surrounding abscess. 2. Small small open wound along the lateral aspect of the forefoot with underlying osteomyelitis involving the fifth metatarsal head. 3. Diffuse and fairly marked subcutaneous soft tissue swelling/edema/fluid consistent with cellulitis. Electronically Signed   By: PMarijo SanesM.D.   On: 03/25/2020 14:40   DG Foot 2 Views Left  Result Date: 03/26/2020 CLINICAL DATA:  Left foot incision and drainage, right third toe partial amputation, right first  metatarsal biopsy EXAM: RIGHT FOOT - 2 VIEW; LEFT FOOT - 2 VIEW COMPARISON:  03/25/2020, 03/24/2020 FINDINGS: Left foot: Frontal and lateral views of the left foot demonstrate postsurgical changes from incision and drainage of the forefoot. Partial resection of the head of the second metatarsal. The second metatarsal fracture seen previously is again noted. Diffuse soft tissue swelling of the forefoot. Right foot: Frontal and lateral views demonstrate interval amputation of the third digit at the level of the proximal interphalangeal joint. Previous first digit amputation at the metatarsophalangeal joint. Fracture of the distal margin of the fifth proximal phalanx is unchanged. There is diffuse soft tissue swelling of the forefoot.  IMPRESSION: 1. Postsurgical changes left foot related to incision and drainage, with likely debridement of the second metatarsal head. Stable second metatarsal fracture. 2. Postsurgical changes from third digit amputation right foot. Stable fracture distal margin fifth proximal phalanx. Electronically Signed   By: Randa Ngo M.D.   On: 03/26/2020 22:25   DG Foot 2 Views Right  Result Date: 03/26/2020 CLINICAL DATA:  Left foot incision and drainage, right third toe partial amputation, right first metatarsal biopsy EXAM: RIGHT FOOT - 2 VIEW; LEFT FOOT - 2 VIEW COMPARISON:  03/25/2020, 03/24/2020 FINDINGS: Left foot: Frontal and lateral views of the left foot demonstrate postsurgical changes from incision and drainage of the forefoot. Partial resection of the head of the second metatarsal. The second metatarsal fracture seen previously is again noted. Diffuse soft tissue swelling of the forefoot. Right foot: Frontal and lateral views demonstrate interval amputation of the third digit at the level of the proximal interphalangeal joint. Previous first digit amputation at the metatarsophalangeal joint. Fracture of the distal margin of the fifth proximal phalanx is unchanged. There is  diffuse soft tissue swelling of the forefoot. IMPRESSION: 1. Postsurgical changes left foot related to incision and drainage, with likely debridement of the second metatarsal head. Stable second metatarsal fracture. 2. Postsurgical changes from third digit amputation right foot. Stable fracture distal margin fifth proximal phalanx. Electronically Signed   By: Randa Ngo M.D.   On: 03/26/2020 22:25   VAS [place] ABI WITH/WO TBI  Result Date: 03/26/2020 LOWER EXTREMITY DOPPLER STUDY Indications: Ulceration. High Risk Factors: None.  Limitations: Today's exam was limited due to Right great toe amputation. Comparison Study: No prior studies. Performing Technologist: Carlos Levering RVT  Examination Guidelines: A complete evaluation includes at minimum, Doppler waveform signals and systolic blood pressure reading at the level of bilateral brachial, anterior tibial, and posterior tibial arteries, when vessel segments are accessible. Bilateral testing is considered an integral part of a complete examination. Photoelectric Plethysmograph (PPG) waveforms and toe systolic pressure readings are included as required and additional duplex testing as needed. Limited examinations for reoccurring indications may be performed as noted.  ABI Findings: +--------+------------------+-----+---------+--------+ Right   Rt Pressure (mmHg)IndexWaveform Comment  +--------+------------------+-----+---------+--------+ [id number]                    triphasic         +--------+------------------+-----+---------+--------+ PTA     233               1.93 triphasic         +--------+------------------+-----+---------+--------+ DP      188               1.55 triphasic         +--------+------------------+-----+---------+--------+ +---------+------------------+-----+---------+-------+ Left     Lt Pressure (mmHg)IndexWaveform Comment +---------+------------------+-----+---------+-------+ Brachial 121                     triphasic        +---------+------------------+-----+---------+-------+ PTA      254               2.10 triphasic        +---------+------------------+-----+---------+-------+ DP       162               1.34 triphasic        +---------+------------------+-----+---------+-------+ Great Toe47                0.39                  +---------+------------------+-----+---------+-------+ +-------+-----------+-----------+------------+------------+  ABI/TBIToday's ABIToday's TBIPrevious ABIPrevious TBI +-------+-----------+-----------+------------+------------+ Right  1.93                                           +-------+-----------+-----------+------------+------------+ Left   1.34       0.39                                +-------+-----------+-----------+------------+------------+  Summary: Right: Resting right ankle-brachial index indicates noncompressible right lower extremity arteries. Unable to obtain TBI due to great toe amputation. Left: Resting left ankle-brachial index indicates noncompressible left lower extremity arteries. The left toe-brachial index is abnormal.  *See table(s) above for measurements and observations.  Electronically signed by Jamelle Haring on 03/26/2020 at 1:43:42 PM.    Final    [place] EKG SITE RITE  Result Date: 03/29/2020 If Site Rite image not attached, placement could not be confirmed due to current cardiac rhythm.   Labs: BNP (last 3 results) Recent Labs    12/25/19 1706  BNP 672.0*   Basic Metabolic Panel: Recent Labs  Lab 03/24/20 1025 03/25/20 0954 03/26/20 0406 03/28/20 0516  NA 139 134* 136  --   K 4.5 4.6 3.9  --   CL 94* 94* 99  --   CO2 30* 29 28  --   GLUCOSE 92 132* 98  --   BUN '17 18 18  ' --   CREATININE 0.92 0.79 0.74 0.94  CALCIUM 9.1 8.8* 8.8*  --    Liver Function Tests: Recent Labs  Lab 03/24/20 1025 03/25/20 0954 03/26/20 0406  AST 17 18 13*  ALT '18 17 15  ' ALKPHOS 156* 108 89  BILITOT 0.6 1.0 0.5   PROT 6.8 7.4 6.2*  ALBUMIN 4.1 3.6 3.0*   No results for input(s): LIPASE, AMYLASE in the last 168 hours. No results for input(s): AMMONIA in the last 168 hours. CBC: Recent Labs  Lab 03/24/20 1025 03/25/20 0954 03/26/20 0406 03/27/20 0546 03/28/20 0516  WBC 12.6* 13.0* 6.1 6.7 5.1  NEUTROABS  --  11.7*  --   --   --   HGB 11.9* 11.5* 10.7* 11.6* 10.9*  HCT 38.3 36.8* 33.6* 37.1* 35.7*  MCV 82 82.7 82.8 83.0 84.0  PLT 323 251 223 270 279   Cardiac Enzymes: No results for input(s): CKTOTAL, CKMB, CKMBINDEX, TROPONINI in the last 168 hours. BNP: Invalid input(s): POCBNP CBG: No results for input(s): GLUCAP in the last 168 hours. D-Dimer No results for input(s): DDIMER in the last 72 hours. Hgb A1c No results for input(s): HGBA1C in the last 72 hours. Lipid Profile No results for input(s): CHOL, HDL, LDLCALC, TRIG, CHOLHDL, LDLDIRECT in the last 72 hours. Thyroid function studies No results for input(s): TSH, T4TOTAL, T3FREE, THYROIDAB in the last 72 hours.  Invalid input(s): FREET3 Anemia work up No results for input(s): VITAMINB12, FOLATE, FERRITIN, TIBC, IRON, RETICCTPCT in the last 72 hours. Urinalysis    Component Value Date/Time   COLORURINE YELLOW 11/18/2019 1044   APPEARANCEUR CLEAR 11/18/2019 1044   LABSPEC 1.018 11/18/2019 1044   PHURINE 6.0 11/18/2019 1044   GLUCOSEU NEGATIVE 11/18/2019 1044   HGBUR NEGATIVE 11/18/2019 1044   BILIRUBINUR NEGATIVE 11/18/2019 1044   BILIRUBINUR NEG 07/08/2013 1238   KETONESUR 20 (A) 11/18/2019 1044   PROTEINUR 100 (A) 11/18/2019 1044   UROBILINOGEN 0.2 07/08/2013 1238  NITRITE NEGATIVE 11/18/2019 1044   LEUKOCYTESUR NEGATIVE 11/18/2019 1044   Sepsis Labs Invalid input(s): PROCALCITONIN,  WBC,  LACTICIDVEN Microbiology Recent Results (from the past 240 hour(s))  Culture, blood (routine x 2)     Status: None (Preliminary result)   Collection Time: 03/25/20  9:54 AM   Specimen: BLOOD  Result Value Ref Range Status    Specimen Description   Final    BLOOD LEFT ARM Performed at [facility] [address] [place], [place] [address]    Special Requests   Final    BOTTLES DRAWN AEROBIC AND ANAEROBIC Blood Culture adequate volume Performed at [facility] [address] [place], [place] [address]    Culture   Final    NO GROWTH 4 DAYS Performed at [facility], [place] [address] [place], [place] [address]    Report Status PENDING  Incomplete  Resp Panel by RT-PCR (Flu A&B, Covid) Nasopharyngeal Swab     Status: None   Collection Time: 03/25/20  9:54 AM   Specimen: Nasopharyngeal Swab; Nasopharyngeal(NP) swabs in vial transport medium  Result Value Ref Range Status   SARS Coronavirus 2 by RT PCR NEGATIVE NEGATIVE Final    Comment: (NOTE) SARS-CoV-2 target nucleic acids are NOT DETECTED.  The SARS-CoV-2 RNA is generally detectable in upper respiratory specimens during the acute phase of infection. The lowest concentration of SARS-CoV-2 viral copies this assay can detect is 138 copies/mL. A negative result does not preclude SARS-Cov-2 infection and should not be used as the sole basis for treatment or other patient management decisions. A negative result may occur with  improper specimen collection/handling, submission of specimen other than nasopharyngeal swab, presence of viral mutation(s) within the areas targeted by this assay, and inadequate number of viral copies(<138 copies/mL). A negative result must be combined with clinical observations, patient history, and epidemiological information. The expected result is Negative.  Fact Sheet for Patients:  EntrepreneurPulse.com.au  Fact Sheet for Healthcare Providers:  IncredibleEmployment.be  This test is no t yet approved or cleared by the [place] FDA and  has been authorized for detection and/or diagnosis of SARS-CoV-2 by FDA under an Emergency Use  Authorization (EUA). This EUA will remain  in effect (meaning this test can be used) for the duration of the COVID-19 declaration under Section 564(b)(1) of the Act, 21 U.S.C.section 360bbb-3(b)(1), unless the authorization is terminated  or revoked sooner.       Influenza A by PCR NEGATIVE NEGATIVE Final   Influenza B by PCR NEGATIVE NEGATIVE Final    Comment: (NOTE) The Xpert Xpress SARS-CoV-2/FLU/RSV plus assay is intended as an aid in the diagnosis of influenza from Nasopharyngeal swab specimens and should not be used as a sole basis for treatment. Nasal washings and aspirates are unacceptable for Xpert Xpress SARS-CoV-2/FLU/RSV testing.  Fact Sheet for Patients: EntrepreneurPulse.com.au  Fact Sheet for Healthcare Providers: IncredibleEmployment.be  This test is not yet approved or cleared by the [place] FDA and has been authorized for detection and/or diagnosis of SARS-CoV-2 by FDA under an Emergency Use Authorization (EUA). This EUA will remain in effect (meaning this test can be used) for the duration of the COVID-19 declaration under Section 564(b)(1) of the Act, 21 U.S.C. section 360bbb-3(b)(1), unless the authorization is terminated or revoked.  Performed at [facility] [place] [address] [place], [place] [address]   Culture, blood (routine x 2)     Status: None (Preliminary result)   Collection Time:  03/25/20  9:55 AM   Specimen: BLOOD  Result Value Ref Range Status   Specimen Description   Final    BLOOD RIGHT ARM Performed at [facility] [address], [place], [place] [address]    Special Requests   Final    BOTTLES DRAWN AEROBIC AND ANAEROBIC Blood Culture results may not be optimal due to an inadequate volume of blood received in culture bottles Performed at [facility] [address], [place], [place] [address]    Culture   Final    NO GROWTH 4  DAYS Performed at [facility], [place] [address], [place], [place] [address]    Report Status PENDING  Incomplete  MRSA PCR Screening     Status: None   Collection Time: 03/26/20  1:50 PM   Specimen: Nasopharyngeal  Result Value Ref Range Status   MRSA by PCR NEGATIVE NEGATIVE Final    Comment:        The GeneXpert MRSA Assay (FDA approved for NASAL specimens only), is one component of a comprehensive MRSA colonization surveillance program. It is not intended to diagnose MRSA infection nor to guide or monitor treatment for MRSA infections. Performed at [facility], [place] [address] [place], [place] [address]   Aerobic/Anaerobic Culture (surgical/deep wound)     Status: None (Preliminary result)   Collection Time: 03/26/20  6:25 PM   Specimen: PATH Soft tissue  Result Value Ref Range Status   Specimen Description   Final    TISSUE RIGHT FIRST METATARSAL Performed at [facility] [address], [place], [place] [address]    Special Requests   Final    NONE Performed at [facility], [place] [address] [place], [place] [address]    Gram Stain NO WBC SEEN NO ORGANISMS SEEN   Final   Culture   Final    NO GROWTH 3 DAYS NO ANAEROBES ISOLATED; CULTURE IN PROGRESS FOR 5 DAYS Performed at [place] [address] [place], [place] [address]    Report Status PENDING  Incomplete  Aerobic/Anaerobic Culture (surgical/deep wound)     Status: None (Preliminary result)   Collection Time: 03/26/20  6:29 PM   Specimen: PATH Other; Tissue  Result Value Ref Range Status   Specimen Description   Final    TOE RIGHT THIRD TOE Performed at [facility] [address] [place], [place] [address]    Special Requests   Final    NONE Performed at [facility], [place] [address], [place], [place] [address]    Gram Stain   Final    FEW WBC PRESENT, PREDOMINANTLY PMN FEW GRAM POSITIVE  COCCI IN CLUSTERS Performed at [facility], [place] [address] [place], [place] [address]    Culture   Final    FEW STAPHYLOCOCCUS AUREUS NO ANAEROBES ISOLATED; CULTURE IN PROGRESS FOR 5 DAYS    Report Status PENDING  Incomplete   Organism ID, Bacteria STAPHYLOCOCCUS AUREUS  Final      Susceptibility   Staphylococcus aureus - MIC*    CIPROFLOXACIN <=0.5 SENSITIVE Sensitive     ERYTHROMYCIN <=0.25 SENSITIVE Sensitive     GENTAMICIN <=0.5 SENSITIVE Sensitive     OXACILLIN <=0.25 SENSITIVE Sensitive     TETRACYCLINE <=1 SENSITIVE Sensitive     VANCOMYCIN 1 SENSITIVE Sensitive     TRIMETH/SULFA <=10 SENSITIVE Sensitive     CLINDAMYCIN <=0.25 SENSITIVE Sensitive  RIFAMPIN <=0.5 SENSITIVE Sensitive     Inducible Clindamycin NEGATIVE Sensitive     * FEW STAPHYLOCOCCUS AUREUS  Aerobic/Anaerobic Culture (surgical/deep wound)     Status: None (Preliminary result)   Collection Time: 03/26/20  6:42 PM   Specimen: PATH Other; Tissue  Result Value Ref Range Status   Specimen Description   Final    TISSUE LEFT FIFTH METATARSAL Performed at [facility], [place] [address] [place], [place] [address]    Special Requests   Final    NONE Performed at [facility], [place] [address] [place], [place] [address]    Gram Stain NO WBC SEEN NO ORGANISMS SEEN   Final   Culture   Final    NO GROWTH 3 DAYS NO ANAEROBES ISOLATED; CULTURE IN PROGRESS FOR 5 DAYS Performed at [facility] [address] [place], [place] [address]    Report Status PENDING  Incomplete  Aerobic/Anaerobic Culture (surgical/deep wound)     Status: None (Preliminary result)   Collection Time: 03/26/20  6:55 PM   Specimen: Wound  Result Value Ref Range Status   Specimen Description   Final    WOUND FOOT LEFT Performed at [facility] [address], [place], [place] [address]    Special Requests   Final    NONE Performed at [facility], [place] [address], [place], [place] [address]    Gram Stain   Final    MODERATE WBC PRESENT, PREDOMINANTLY PMN FEW GRAM POSITIVE COCCI IN CLUSTERS Performed at [facility], [place] [address], [place], [place] [address]    Culture   Final    FEW STAPHYLOCOCCUS AUREUS NO ANAEROBES ISOLATED; CULTURE IN PROGRESS FOR 5 DAYS    Report Status PENDING  Incomplete   Organism ID, Bacteria STAPHYLOCOCCUS AUREUS  Final      Susceptibility   Staphylococcus aureus - MIC*    CIPROFLOXACIN <=0.5 SENSITIVE Sensitive     ERYTHROMYCIN <=0.25 SENSITIVE Sensitive     GENTAMICIN <=0.5 SENSITIVE Sensitive     OXACILLIN 0.5 SENSITIVE Sensitive     TETRACYCLINE <=1 SENSITIVE Sensitive     VANCOMYCIN <=0.5 SENSITIVE Sensitive     TRIMETH/SULFA <=10 SENSITIVE Sensitive     CLINDAMYCIN <=0.25 SENSITIVE Sensitive     RIFAMPIN <=0.5 SENSITIVE Sensitive     Inducible Clindamycin NEGATIVE Sensitive     * FEW STAPHYLOCOCCUS AUREUS  Aerobic/Anaerobic Culture (surgical/deep wound)     Status: None (Preliminary result)   Collection Time: 03/26/20  7:05 PM   Specimen: Wound; Tissue  Result Value Ref Range Status   Specimen Description   Final    WOUND LEFT 2ND METATARSAL Performed at [facility] [address] [place], [place] [address]    Special Requests   Final    NONE Performed at [facility], [place] [address], [place], [place] [address]    Gram Stain NO WBC SEEN NO ORGANISMS SEEN   Final   Culture   Final    NO GROWTH 3 DAYS NO ANAEROBES ISOLATED; CULTURE IN PROGRESS FOR 5 DAYS Performed at [facility] [address], [place], [place] [address]    Report Status PENDING  Incomplete     Time coordinating discharge: 35 minutes  SIGNED: Antonieta Pert, MD  [facility] 03/29/2020, 2:04 PM  If 7PM-7AM, please contact night-coverage [email or web address]

## 2020-03-30 ENCOUNTER — Other Ambulatory Visit: Payer: Self-pay | Admitting: Cardiology

## 2020-03-30 ENCOUNTER — Other Ambulatory Visit: Payer: Self-pay | Admitting: Family Medicine

## 2020-03-30 DIAGNOSIS — I5022 Chronic systolic (congestive) heart failure: Secondary | ICD-10-CM | POA: Diagnosis not present

## 2020-03-30 DIAGNOSIS — G629 Polyneuropathy, unspecified: Secondary | ICD-10-CM | POA: Diagnosis not present

## 2020-03-30 DIAGNOSIS — L97522 Non-pressure chronic ulcer of other part of left foot with fat layer exposed: Secondary | ICD-10-CM | POA: Diagnosis not present

## 2020-03-30 DIAGNOSIS — I4892 Unspecified atrial flutter: Secondary | ICD-10-CM | POA: Diagnosis not present

## 2020-03-30 DIAGNOSIS — I110 Hypertensive heart disease with heart failure: Secondary | ICD-10-CM | POA: Diagnosis not present

## 2020-03-30 DIAGNOSIS — Z452 Encounter for adjustment and management of vascular access device: Secondary | ICD-10-CM | POA: Diagnosis not present

## 2020-03-30 DIAGNOSIS — M868X7 Other osteomyelitis, ankle and foot: Secondary | ICD-10-CM | POA: Diagnosis not present

## 2020-03-30 DIAGNOSIS — R600 Localized edema: Secondary | ICD-10-CM

## 2020-03-30 DIAGNOSIS — E785 Hyperlipidemia, unspecified: Secondary | ICD-10-CM | POA: Diagnosis not present

## 2020-03-30 DIAGNOSIS — L97526 Non-pressure chronic ulcer of other part of left foot with bone involvement without evidence of necrosis: Secondary | ICD-10-CM | POA: Diagnosis not present

## 2020-03-30 DIAGNOSIS — I429 Cardiomyopathy, unspecified: Secondary | ICD-10-CM | POA: Diagnosis not present

## 2020-03-30 DIAGNOSIS — F40240 Claustrophobia: Secondary | ICD-10-CM | POA: Diagnosis not present

## 2020-03-30 DIAGNOSIS — L02612 Cutaneous abscess of left foot: Secondary | ICD-10-CM | POA: Diagnosis not present

## 2020-03-30 DIAGNOSIS — L03116 Cellulitis of left lower limb: Secondary | ICD-10-CM | POA: Diagnosis not present

## 2020-03-30 DIAGNOSIS — Z4781 Encounter for orthopedic aftercare following surgical amputation: Secondary | ICD-10-CM | POA: Diagnosis not present

## 2020-03-30 DIAGNOSIS — R7303 Prediabetes: Secondary | ICD-10-CM | POA: Diagnosis not present

## 2020-03-30 DIAGNOSIS — B9561 Methicillin susceptible Staphylococcus aureus infection as the cause of diseases classified elsewhere: Secondary | ICD-10-CM | POA: Diagnosis not present

## 2020-03-30 LAB — SURGICAL PATHOLOGY

## 2020-03-30 LAB — CULTURE, BLOOD (ROUTINE X 2)
Culture: NO GROWTH
Culture: NO GROWTH
Special Requests: ADEQUATE

## 2020-03-30 MED ORDER — OXYCODONE HCL 15 MG PO TABS: 15.0000 mg | ORAL_TABLET | ORAL | 0 refills | Status: DC | PRN

## 2020-03-30 NOTE — Telephone Encounter (Addendum)
Patient is in severe pain after foot amputation surgery,just release from hospital 1 day ago.The Oxycodone (10-325mg  ) that was prescribed is not enough, only has 3 day supply remaining. He wants a review of his chart and something extra called in for the pain.

## 2020-03-30 NOTE — Addendum Note (Signed)
Addended by: Hardie Pulley on: 03/30/2020 03:29 PM   Modules accepted: Orders

## 2020-03-30 NOTE — Telephone Encounter (Signed)
Informed patient that prescription has been sent to pharmacy on file, verbalized understanding, thank you

## 2020-03-31 ENCOUNTER — Telehealth: Payer: Self-pay | Admitting: Podiatry

## 2020-03-31 ENCOUNTER — Other Ambulatory Visit: Payer: Self-pay

## 2020-03-31 ENCOUNTER — Telehealth: Payer: Self-pay

## 2020-03-31 ENCOUNTER — Encounter: Payer: Self-pay | Admitting: Nurse Practitioner

## 2020-03-31 ENCOUNTER — Ambulatory Visit: Payer: Medicare Other | Admitting: Nurse Practitioner

## 2020-03-31 VITALS — BP 130/70 | HR 63 | Ht 79.0 in | Wt 193.0 lb

## 2020-03-31 DIAGNOSIS — I484 Atypical atrial flutter: Secondary | ICD-10-CM

## 2020-03-31 DIAGNOSIS — I428 Other cardiomyopathies: Secondary | ICD-10-CM | POA: Diagnosis not present

## 2020-03-31 DIAGNOSIS — Z79899 Other long term (current) drug therapy: Secondary | ICD-10-CM | POA: Diagnosis not present

## 2020-03-31 DIAGNOSIS — I5022 Chronic systolic (congestive) heart failure: Secondary | ICD-10-CM | POA: Diagnosis not present

## 2020-03-31 LAB — AEROBIC/ANAEROBIC CULTURE W GRAM STAIN (SURGICAL/DEEP WOUND)
Culture: NO GROWTH
Culture: NO GROWTH
Culture: NO GROWTH
Gram Stain: NONE SEEN
Gram Stain: NONE SEEN
Gram Stain: NONE SEEN

## 2020-03-31 LAB — TSH: TSH: 3.74 u[IU]/mL (ref 0.450–4.500)

## 2020-03-31 MED ORDER — OXYCODONE-ACETAMINOPHEN 10-325 MG PO TABS
1.0000 | ORAL_TABLET | ORAL | 0 refills | Status: DC | PRN
Start: 2020-03-31 — End: 2020-04-06

## 2020-03-31 MED ORDER — ATORVASTATIN CALCIUM 40 MG PO TABS: 40.0000 mg | ORAL_TABLET | Freq: Every day | ORAL | 3 refills | Status: DC

## 2020-03-31 MED ORDER — APIXABAN 5 MG PO TABS: 5.0000 mg | ORAL_TABLET | Freq: Two times a day (BID) | ORAL | 6 refills | Status: DC

## 2020-03-31 MED ORDER — OXYCODONE-ACETAMINOPHEN 5-325 MG PO TABS
1.0000 | ORAL_TABLET | ORAL | 0 refills | Status: DC | PRN
Start: 2020-03-31 — End: 2020-04-05

## 2020-03-31 MED ORDER — POTASSIUM CHLORIDE CRYS ER 20 MEQ PO TBCR: 40.0000 meq | EXTENDED_RELEASE_TABLET | Freq: Every day | ORAL | 3 refills | Status: DC

## 2020-03-31 MED ORDER — TORSEMIDE 20 MG PO TABS: 20.0000 mg | ORAL_TABLET | Freq: Every day | ORAL | 3 refills | Status: DC

## 2020-03-31 MED ORDER — AMIODARONE HCL 200 MG PO TABS: 200.0000 mg | ORAL_TABLET | Freq: Every day | ORAL | 3 refills | Status: DC

## 2020-03-31 MED ORDER — LISINOPRIL 10 MG PO TABS: 10.0000 mg | ORAL_TABLET | Freq: Every day | ORAL | 3 refills | Status: DC

## 2020-03-31 NOTE — Telephone Encounter (Signed)
Patient called stating that Dr. March Rummage and Posey Pronto both sent in pain medication to the pharmacy for him. The pharmacy would not fill both. I called and he also picked up a prescription for 180 tablets of percocet 10/325 on November 17th. Patient states that he has been taking 1.5 tablets since he got home from surgery and has about 30 left. I called the pharmacy staff back to let them know they can fill the prescription that Dr. March Rummage sent over on Sunday. Spoke with the patient.   The prescriptions from Dr. Posey Pronto I cancelled.   All other pain medications are on hold.

## 2020-03-31 NOTE — Patient Instructions (Addendum)
After Visit Summary:  We will be checking the following labs today - TSH   BMET in about 10 days   Medication Instructions:    Continue with your current medicines. BUT  I am cutting the Demedex back to just once a day  I am cutting the Potassium back to just once a day  I am cutting back the Amiodarone to just once a day  I am adding back Lisinopril but at 10 mg a day  I have refilled your other heart medicines.    If you need a refill on your cardiac medications before your next appointment, please call your pharmacy.     Testing/Procedures To Be Arranged:  Limited echo in about 6 weeks  Follow-Up:  See Dr. Johney Frame in 2 months   At [facility], you and your health needs are our priority.  As part of our continuing mission to provide you with exceptional heart care, we have created designated Provider Care Teams.  These Care Teams include your primary Cardiologist (physician) and Advanced Practice Providers (APPs -  Physician Assistants and Nurse Practitioners) who all work together to provide you with the care you need, when you need it.  Special Instructions:  . Stay safe, wash your hands for at least 20 seconds and wear a mask when needed.  . It was good to talk with you today.  Marland Kitchen Keep a check on your BP for [place].    Call the [place] office at [phone] if you have any questions, problems or concerns.

## 2020-03-31 NOTE — Telephone Encounter (Signed)
Pt called today stating that his medication that was called in yesterday by Dr. March Rummage needs prior authorization from [facility] for Oxycodone (Roxicodone) 15 mg. Please advise. Thanks

## 2020-04-01 ENCOUNTER — Telehealth: Payer: Self-pay

## 2020-04-01 NOTE — Telephone Encounter (Signed)
Already sent him a different medication that does not require prior authorization.  He was able to fill it as well.

## 2020-04-01 NOTE — Telephone Encounter (Signed)
Great - thanks

## 2020-04-01 NOTE — Telephone Encounter (Signed)
Amber from [employer] for skilled nursing verbal orders as follows:  1 time(s) weekly for 7 week(s) for IV abx and wound care.   Verbal orders given per [facility] protocol  Talbot Grumbling, RN

## 2020-04-02 ENCOUNTER — Ambulatory Visit (INDEPENDENT_AMBULATORY_CARE_PROVIDER_SITE_OTHER): Payer: Medicare Other | Admitting: Podiatry

## 2020-04-02 ENCOUNTER — Ambulatory Visit (INDEPENDENT_AMBULATORY_CARE_PROVIDER_SITE_OTHER): Payer: Medicare Other

## 2020-04-02 ENCOUNTER — Other Ambulatory Visit: Payer: Self-pay

## 2020-04-02 DIAGNOSIS — Z89411 Acquired absence of right great toe: Secondary | ICD-10-CM

## 2020-04-02 DIAGNOSIS — M216X2 Other acquired deformities of left foot: Secondary | ICD-10-CM

## 2020-04-05 ENCOUNTER — Telehealth: Payer: Self-pay | Admitting: Podiatry

## 2020-04-05 DIAGNOSIS — M869 Osteomyelitis, unspecified: Secondary | ICD-10-CM | POA: Diagnosis not present

## 2020-04-05 MED ORDER — OXYCODONE-ACETAMINOPHEN 5-325 MG PO TABS
1.0000 | ORAL_TABLET | ORAL | 0 refills | Status: DC | PRN
Start: 2020-04-05 — End: 2020-04-06

## 2020-04-05 NOTE — Telephone Encounter (Signed)
Pt called and wanted to talk to Dr March Rummage or Dr Jacqualyn Posey about his pain medication. He said he is not sure why he was only prescribed 3 days supply of pain medication for an amputation done 12.3. He said he does see [facility] but they will not refill his pain medication until 12.23 and he is having a lot of pain with the amputation. Please call pt asap.

## 2020-04-05 NOTE — Telephone Encounter (Signed)
Pt would like a refill on oxycodone through 12/22. Please advise.

## 2020-04-05 NOTE — Addendum Note (Signed)
Addended by: Hardie Pulley on: 04/05/2020 05:26 PM   Modules accepted: Orders

## 2020-04-05 NOTE — Telephone Encounter (Signed)
Medication was refilled.

## 2020-04-05 NOTE — Telephone Encounter (Signed)
Refilled

## 2020-04-06 ENCOUNTER — Other Ambulatory Visit: Payer: Self-pay | Admitting: Podiatry

## 2020-04-06 ENCOUNTER — Encounter: Payer: Self-pay | Admitting: Podiatry

## 2020-04-06 ENCOUNTER — Other Ambulatory Visit: Payer: Self-pay | Admitting: Student

## 2020-04-06 ENCOUNTER — Other Ambulatory Visit: Payer: Self-pay | Admitting: Family Medicine

## 2020-04-06 DIAGNOSIS — M86171 Other acute osteomyelitis, right ankle and foot: Secondary | ICD-10-CM | POA: Diagnosis not present

## 2020-04-06 DIAGNOSIS — M069 Rheumatoid arthritis, unspecified: Secondary | ICD-10-CM | POA: Diagnosis not present

## 2020-04-06 DIAGNOSIS — M25551 Pain in right hip: Secondary | ICD-10-CM | POA: Diagnosis not present

## 2020-04-06 DIAGNOSIS — M79673 Pain in unspecified foot: Secondary | ICD-10-CM | POA: Diagnosis not present

## 2020-04-06 DIAGNOSIS — G894 Chronic pain syndrome: Secondary | ICD-10-CM | POA: Diagnosis not present

## 2020-04-06 MED ORDER — OXYCODONE-ACETAMINOPHEN 10-325 MG PO TABS: 1.0000 | ORAL_TABLET | ORAL | 0 refills | Status: DC | PRN

## 2020-04-06 NOTE — Telephone Encounter (Signed)
Called and informed patient of prescription change, verbalized understanding and responded "Thank you very much."

## 2020-04-06 NOTE — Telephone Encounter (Signed)
Patient is calling and wants to know if he can be prescribed a stronger dosage of the Percocet, does not think the 5-325mg  tablet will help w/ his pain.Please advise.

## 2020-04-06 NOTE — Progress Notes (Signed)
Subjective:  Patient ID: Thomas [name], male    DOB: 1951/01/01,  MRN: [id number]  Chief Complaint  Patient presents with  . Routine Post Op    PT stated that he is having a lot of pain mainly with the right foot on the lateral side. He has no concerns.    DOS: 03/26/2020 Procedure: Left foot incision and drainage with removal of all nonviable tissue second and fifth metatarsal and right third digit amputation  69 y.o. male returns for post-op check.  Patient presents with follow-up of to her undergoing a listed above procedure.  Patient states he is doing well.  He has some pain.  He has been ambulating with bilateral surgical shoes.  He denies any other acute complaints.  Review of Systems: Negative except as noted in the HPI. Denies N/V/F/Ch.  Past Medical History:  Diagnosis Date  . Atrial flutter ([place]) 01/2020  . Cancer [facility])    prostate  . Claustrophobia    quite severe  . Heart failure with reduced ejection fraction ([place])   . Hypertension   . Hypoglycemia    occ  . Left knee DJD    Xray 12/23/08  . NICM (nonischemic cardiomyopathy) ([place]) 02/12/2020  . Prostate cancer [facility])     Current Outpatient Medications:  .  amiodarone (PACERONE) 200 MG tablet, Take 1 tablet (200 mg total) by mouth daily., Disp: 90 tablet, Rfl: 3 .  apixaban (ELIQUIS) 5 MG TABS tablet, Take 1 tablet (5 mg total) by mouth 2 (two) times daily., Disp: 60 tablet, Rfl: 6 .  atorvastatin (LIPITOR) 40 MG tablet, Take 1 tablet (40 mg total) by mouth daily., Disp: 90 tablet, Rfl: 3 .  ceFAZolin (ANCEF) IVPB, Inject 2 g into the vein every 8 (eight) hours. Indication:  Osteomyelitis First Dose: No Last Day of Therapy:  05/08/2020 Labs - Once weekly:  CBC/D and BMP, Labs - Every other week:  ESR and CRP Method of administration: IV Push Method of administration may be changed at the discretion of home infusion pharmacist based upon assessment of the patient and/or caregiver's ability to self-administer the  medication ordered., Disp: 120 Units, Rfl: 0 .  DULoxetine (CYMBALTA) 60 MG capsule, Take 1 capsule (60 mg total) by mouth daily., Disp: 90 capsule, Rfl: 1 .  ENBREL SURECLICK 50 MG/ML injection, Inject 25 mg into the skin once a week. , Disp: , Rfl:  .  lisinopril (ZESTRIL) 10 MG tablet, Take 1 tablet (10 mg total) by mouth daily., Disp: 90 tablet, Rfl: 3 .  Melatonin 10 MG TABS, Take 10 mg by mouth at bedtime., Disp: , Rfl:  .  methotrexate (RHEUMATREX) 2.5 MG tablet, Take 20 mg by mouth once a week. , Disp: , Rfl:  .  NARCAN 4 MG/0.1ML LIQD nasal spray kit, Place 1 spray into the nose as needed (as directed for emergency)., Disp: 2 each, Rfl: 0 .  oxyCODONE (ROXICODONE) 15 MG immediate release tablet, Take 1 tablet (15 mg total) by mouth every 4 (four) hours as needed for pain., Disp: 20 tablet, Rfl: 0 .  oxyCODONE-acetaminophen (PERCOCET) 10-325 MG tablet, Take 1 tablet by mouth every 4 (four) hours. , Disp: , Rfl:  .  oxyCODONE-acetaminophen (PERCOCET) 10-325 MG tablet, Take 1 tablet by mouth every 4 (four) hours as needed for pain., Disp: 30 tablet, Rfl: 0 .  oxyCODONE-acetaminophen (PERCOCET) 5-325 MG tablet, Take 1-2 tablets by mouth every 4 (four) hours as needed for severe pain., Disp: 40 tablet, Rfl: 0 .  potassium chloride SA (KLOR-CON M20) 20 MEQ tablet, Take 2 tablets (40 mEq total) by mouth daily., Disp: 180 tablet, Rfl: 3 .  predniSONE (DELTASONE) 10 MG tablet, Take 10 mg by mouth daily with breakfast. , Disp: , Rfl:  .  tamsulosin (FLOMAX) 0.4 MG CAPS capsule, Take 0.4 mg by mouth daily. , Disp: , Rfl:  .  torsemide (DEMADEX) 20 MG tablet, Take 1 tablet (20 mg total) by mouth daily., Disp: 90 tablet, Rfl: 3  Social History   Tobacco Use  Smoking Status Never Smoker  Smokeless Tobacco Never Used    Allergies  Allergen Reactions  . Chlorthalidone Other (See Comments)    "Makes me light-headed and I don't like the way it makes me feel"  . Voltaren [Diclofenac] Rash    Objective:  There were no vitals filed for this visit. There is no height or weight on file to calculate BMI. Constitutional Well developed. Well nourished.  Vascular Foot warm and well perfused. Capillary refill normal to all digits.   Neurologic Normal speech. Oriented to person, place, and time. Epicritic sensation to light touch grossly present bilaterally.  Dermatologic Skin healing well without signs of infection. Skin edges well coapted without signs of infection.  Orthopedic: Tenderness to palpation noted about the surgical site.   Radiographs: 3 views of skeletally mature adult bilateral foot amputation noted to the right third digit.  No osteomyelitis noted.  No other bony abnormalities noted.  No new changes from previous x-ray noted.  Assessment:   1. Status post amputation of great toe, right ([place])   2. Plantar flexed metatarsal bone of left foot    Plan:  Patient was evaluated and treated and all questions answered.  S/p foot surgery bilaterally -Progressing as expected post-operatively. -XR: See above -WB Status: Weightbearing as tolerated in cam boot -Sutures: Intact.  No signs of dehiscence noted.  No clinical signs of infection noted -Medications: None -Awaiting cultures however patient is on antibiotics through PICC line and is being primarily managed by infectious disease.  No follow-ups on file.

## 2020-04-09 DIAGNOSIS — G894 Chronic pain syndrome: Secondary | ICD-10-CM | POA: Diagnosis not present

## 2020-04-09 DIAGNOSIS — M79673 Pain in unspecified foot: Secondary | ICD-10-CM | POA: Diagnosis not present

## 2020-04-09 DIAGNOSIS — M25551 Pain in right hip: Secondary | ICD-10-CM | POA: Diagnosis not present

## 2020-04-09 DIAGNOSIS — M069 Rheumatoid arthritis, unspecified: Secondary | ICD-10-CM | POA: Diagnosis not present

## 2020-04-12 ENCOUNTER — Other Ambulatory Visit: Payer: Medicare Other | Admitting: *Deleted

## 2020-04-12 ENCOUNTER — Other Ambulatory Visit: Payer: Self-pay

## 2020-04-12 DIAGNOSIS — I428 Other cardiomyopathies: Secondary | ICD-10-CM

## 2020-04-12 DIAGNOSIS — I5022 Chronic systolic (congestive) heart failure: Secondary | ICD-10-CM | POA: Diagnosis not present

## 2020-04-13 ENCOUNTER — Other Ambulatory Visit: Payer: Self-pay | Admitting: *Deleted

## 2020-04-13 ENCOUNTER — Telehealth: Payer: Self-pay | Admitting: Cardiology

## 2020-04-13 DIAGNOSIS — E875 Hyperkalemia: Secondary | ICD-10-CM

## 2020-04-13 DIAGNOSIS — M0579 Rheumatoid arthritis with rheumatoid factor of multiple sites without organ or systems involvement: Secondary | ICD-10-CM | POA: Diagnosis not present

## 2020-04-13 DIAGNOSIS — C61 Malignant neoplasm of prostate: Secondary | ICD-10-CM | POA: Diagnosis not present

## 2020-04-13 DIAGNOSIS — M150 Primary generalized (osteo)arthritis: Secondary | ICD-10-CM | POA: Diagnosis not present

## 2020-04-13 DIAGNOSIS — M79604 Pain in right leg: Secondary | ICD-10-CM | POA: Diagnosis not present

## 2020-04-13 LAB — BASIC METABOLIC PANEL
BUN/Creatinine Ratio: 17 (ref 10–24)
BUN: 16 mg/dL (ref 8–27)
CO2: 29 mmol/L (ref 20–29)
Calcium: 9.1 mg/dL (ref 8.6–10.2)
Chloride: 102 mmol/L (ref 96–106)
Creatinine, Ser: 0.92 mg/dL (ref 0.76–1.27)
GFR calc Af Amer: 99 mL/min/{1.73_m2} (ref >59)
GFR calc non Af Amer: 86 mL/min/{1.73_m2} (ref >59)
Glucose: 153 mg/dL — ABNORMAL HIGH (ref 65–99)
Potassium: 5 mmol/L (ref 3.5–5.2)
Sodium: 143 mmol/L (ref 134–144)

## 2020-04-13 NOTE — Telephone Encounter (Signed)
Patient returning [place] call for results, please call/advise.   Says please call after 11:45 AM.   Thank you!

## 2020-04-14 ENCOUNTER — Other Ambulatory Visit: Payer: Self-pay

## 2020-04-14 ENCOUNTER — Ambulatory Visit (INDEPENDENT_AMBULATORY_CARE_PROVIDER_SITE_OTHER): Payer: Medicare Other | Admitting: Podiatry

## 2020-04-14 DIAGNOSIS — L97522 Non-pressure chronic ulcer of other part of left foot with fat layer exposed: Secondary | ICD-10-CM

## 2020-04-14 DIAGNOSIS — Z89411 Acquired absence of right great toe: Secondary | ICD-10-CM

## 2020-04-14 DIAGNOSIS — M216X2 Other acquired deformities of left foot: Secondary | ICD-10-CM

## 2020-04-14 NOTE — ED Provider Notes (Signed)
.  Critical Care Performed by: Lorayne Bender, PA-C Authorized by: Lorayne Bender, PA-C   Critical care provider statement:    Critical care time (minutes):  35   Critical care time was exclusive of:  Separately billable procedures and treating other patients   Critical care was necessary to treat or prevent imminent or life-threatening deterioration of the following conditions: Persistent infection requiring surgical intervention.   Critical care was time spent personally by me on the following activities:  Ordering and performing treatments and interventions, ordering and review of laboratory studies, ordering and review of radiographic studies, re-evaluation of patient's condition, review of old charts, obtaining history from patient or surrogate, examination of patient, evaluation of patient's response to treatment, discussions with consultants and development of treatment plan with patient or surrogate   I assumed direction of critical care for this patient from another provider in my specialty: no     Care discussed with: admitting provider        Lorayne Bender, PA-C 04/14/20 [place], Gwenyth Allegra, MD 04/14/20 1550

## 2020-04-15 ENCOUNTER — Encounter: Payer: Self-pay | Admitting: Podiatry

## 2020-04-15 NOTE — Progress Notes (Signed)
Subjective:  Patient ID: Thomas [name], male    DOB: 04-09-1951,  MRN: [id number]  Chief Complaint  Patient presents with  . Post-op Problem    Pt stated that he is doing okay he does have some pain but has no concerns.    DOS: 03/26/2020 Procedure: Left foot incision and drainage with removal of all nonviable tissue second and fifth metatarsal and right third digit amputation  69 y.o. male returns for post-op check.  Patient presents with follow-up of to her undergoing a listed above procedure.  Patient states he is doing well.  He has some pain.  He has been ambulating with bilateral surgical shoes.  He denies any other acute complaints.  Review of Systems: Negative except as noted in the HPI. Denies N/V/F/Ch.  Past Medical History:  Diagnosis Date  . Atrial flutter ([place]) 01/2020  . Cancer [facility])    prostate  . Claustrophobia    quite severe  . Heart failure with reduced ejection fraction ([place])   . Hypertension   . Hypoglycemia    occ  . Left knee DJD    Xray 12/23/08  . NICM (nonischemic cardiomyopathy) ([place]) 02/12/2020  . Prostate cancer [facility])     Current Outpatient Medications:  .  amiodarone (PACERONE) 200 MG tablet, Take 1 tablet (200 mg total) by mouth daily., Disp: 90 tablet, Rfl: 3 .  apixaban (ELIQUIS) 5 MG TABS tablet, Take 1 tablet (5 mg total) by mouth 2 (two) times daily., Disp: 60 tablet, Rfl: 6 .  atorvastatin (LIPITOR) 40 MG tablet, Take 1 tablet (40 mg total) by mouth daily., Disp: 90 tablet, Rfl: 3 .  ceFAZolin (ANCEF) IVPB, Inject 2 g into the vein every 8 (eight) hours. Indication:  Osteomyelitis First Dose: No Last Day of Therapy:  05/08/2020 Labs - Once weekly:  CBC/D and BMP, Labs - Every other week:  ESR and CRP Method of administration: IV Push Method of administration may be changed at the discretion of home infusion pharmacist based upon assessment of the patient and/or caregiver's ability to self-administer the medication ordered., Disp: 120 Units,  Rfl: 0 .  DULoxetine (CYMBALTA) 60 MG capsule, Take 1 capsule (60 mg total) by mouth daily., Disp: 90 capsule, Rfl: 1 .  ENBREL SURECLICK 50 MG/ML injection, Inject 25 mg into the skin once a week. , Disp: , Rfl:  .  lisinopril (ZESTRIL) 10 MG tablet, Take 1 tablet (10 mg total) by mouth daily., Disp: 90 tablet, Rfl: 3 .  Melatonin 10 MG TABS, Take 10 mg by mouth at bedtime., Disp: , Rfl:  .  methotrexate (RHEUMATREX) 2.5 MG tablet, Take 20 mg by mouth once a week. , Disp: , Rfl:  .  NARCAN 4 MG/0.1ML LIQD nasal spray kit, Place 1 spray into the nose as needed (as directed for emergency)., Disp: 2 each, Rfl: 0 .  oxyCODONE (ROXICODONE) 15 MG immediate release tablet, Take 1 tablet (15 mg total) by mouth every 4 (four) hours as needed for pain., Disp: 20 tablet, Rfl: 0 .  oxyCODONE-acetaminophen (PERCOCET) 10-325 MG tablet, Take 1 tablet by mouth every 4 (four) hours. , Disp: , Rfl:  .  oxyCODONE-acetaminophen (PERCOCET) 10-325 MG tablet, Take 1 tablet by mouth every 4 (four) hours as needed for pain., Disp: 40 tablet, Rfl: 0 .  potassium chloride SA (KLOR-CON M20) 20 MEQ tablet, Take 2 tablets (40 mEq total) by mouth daily., Disp: 180 tablet, Rfl: 3 .  predniSONE (DELTASONE) 10 MG tablet, Take 10 mg by  mouth daily with breakfast. , Disp: , Rfl:  .  tamsulosin (FLOMAX) 0.4 MG CAPS capsule, Take 0.4 mg by mouth daily. , Disp: , Rfl:  .  torsemide (DEMADEX) 20 MG tablet, Take 1 tablet (20 mg total) by mouth daily., Disp: 90 tablet, Rfl: 3  Social History   Tobacco Use  Smoking Status Never Smoker  Smokeless Tobacco Never Used    Allergies  Allergen Reactions  . Chlorthalidone Other (See Comments)    "Makes me light-headed and I don't like the way it makes me feel"  . Voltaren [Diclofenac] Rash   Objective:  There were no vitals filed for this visit. There is no height or weight on file to calculate BMI. Constitutional Well developed. Well nourished.  Vascular Foot warm and well  perfused. Capillary refill normal to all digits.   Neurologic Normal speech. Oriented to person, place, and time. Epicritic sensation to light touch grossly present bilaterally.  Dermatologic Skin healing well without signs of infection. Skin edges well coapted without signs of infection.  Orthopedic:  No tenderness to palpation noted about the surgical site.   Radiographs: 3 views of skeletally mature adult bilateral foot amputation noted to the right third digit.  No osteomyelitis noted.  No other bony abnormalities noted.  No new changes from previous x-ray noted.  Assessment:   1. Status post amputation of great toe, right ([place])   2. Plantar flexed metatarsal bone of left foot   3. Foot ulcer with fat layer exposed, left ([place])    Plan:  Patient was evaluated and treated and all questions answered.  S/p foot surgery bilaterally -Progressing as expected post-operatively. -XR: See above -WB Status: Weightbearing as tolerated in cam boot -Sutures: Removed.  No signs of dehiscence noted no clinical signs of infection noted. -Medications: None -Antibiotics per infectious disease -Overall he is healing really well.  He has been off of his foot and not working as well which may have helped with the healing a lot.  I encouraged him to continue staying off of the foot is much as possible.  Patient states understanding  No follow-ups on file.

## 2020-04-16 DIAGNOSIS — M86171 Other acute osteomyelitis, right ankle and foot: Secondary | ICD-10-CM | POA: Diagnosis not present

## 2020-04-19 ENCOUNTER — Telehealth: Payer: Self-pay

## 2020-04-19 DIAGNOSIS — Z452 Encounter for adjustment and management of vascular access device: Secondary | ICD-10-CM | POA: Diagnosis not present

## 2020-04-19 DIAGNOSIS — B9561 Methicillin susceptible Staphylococcus aureus infection as the cause of diseases classified elsewhere: Secondary | ICD-10-CM | POA: Diagnosis not present

## 2020-04-19 DIAGNOSIS — Z5181 Encounter for therapeutic drug level monitoring: Secondary | ICD-10-CM | POA: Diagnosis not present

## 2020-04-19 NOTE — Telephone Encounter (Signed)
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 Maralyn Sago reports per her record this patient has been on "chronic" opioids, however has "not been demonstrating that." Maralyn Sago reports when she goes over to check his wound and change his dressing she has to continue to wake him. Maralyn Sago reports he fell asleep multiple times during today's dressing change. Unfortunately, Maralyn Sago did not leave a call back number. Will forward to PCP to make aware.

## 2020-04-22 ENCOUNTER — Telehealth: Payer: Self-pay | Admitting: Podiatry

## 2020-04-22 MED ORDER — OXYCODONE-ACETAMINOPHEN 10-325 MG PO TABS: 1.0000 | ORAL_TABLET | ORAL | 0 refills | Status: DC | PRN

## 2020-04-22 NOTE — Telephone Encounter (Signed)
Patient said he has 4 days of pain medication left and doesn't want to get caught with the holidays with needing a refill and he wanted to see if he could have a refill sent in.

## 2020-04-22 NOTE — Addendum Note (Signed)
Addended by: Ventura Sellers on: 04/22/2020 05:33 PM   Modules accepted: Orders

## 2020-04-22 NOTE — Telephone Encounter (Signed)
Refilled

## 2020-04-23 ENCOUNTER — Other Ambulatory Visit: Payer: Self-pay

## 2020-04-23 ENCOUNTER — Emergency Department (HOSPITAL_COMMUNITY)
Admission: EM | Admit: 2020-04-23 | Discharge: 2020-04-23 | Disposition: A | Discharge status: Home / Self Care | Payer: Medicare Other | Attending: Emergency Medicine | Admitting: Emergency Medicine

## 2020-04-23 ENCOUNTER — Encounter (HOSPITAL_COMMUNITY): Payer: Self-pay | Admitting: Emergency Medicine

## 2020-04-23 DIAGNOSIS — Z5321 Procedure and treatment not carried out due to patient leaving prior to being seen by health care provider: Secondary | ICD-10-CM | POA: Insufficient documentation

## 2020-04-23 DIAGNOSIS — L089 Local infection of the skin and subcutaneous tissue, unspecified: Secondary | ICD-10-CM | POA: Insufficient documentation

## 2020-04-23 NOTE — ED Triage Notes (Signed)
Per pt, states he has had a PICC for a left foot infection-states tape got caught on something and line was pulled out

## 2020-04-24 DIAGNOSIS — M86171 Other acute osteomyelitis, right ankle and foot: Secondary | ICD-10-CM | POA: Diagnosis not present

## 2020-04-25 ENCOUNTER — Other Ambulatory Visit: Payer: Self-pay | Admitting: Podiatry

## 2020-04-25 ENCOUNTER — Other Ambulatory Visit: Payer: Self-pay | Admitting: Family Medicine

## 2020-04-25 DIAGNOSIS — R600 Localized edema: Secondary | ICD-10-CM

## 2020-04-26 ENCOUNTER — Telehealth: Payer: Self-pay | Admitting: Podiatry

## 2020-04-26 ENCOUNTER — Other Ambulatory Visit: Payer: Self-pay | Admitting: Sports Medicine

## 2020-04-26 ENCOUNTER — Telehealth (INDEPENDENT_AMBULATORY_CARE_PROVIDER_SITE_OTHER): Payer: Medicare Other | Admitting: Podiatry

## 2020-04-26 ENCOUNTER — Telehealth: Payer: Self-pay | Admitting: Urology

## 2020-04-26 ENCOUNTER — Other Ambulatory Visit (HOSPITAL_COMMUNITY): Payer: Self-pay | Admitting: Podiatry

## 2020-04-26 ENCOUNTER — Other Ambulatory Visit (HOSPITAL_COMMUNITY): Payer: Self-pay | Admitting: Student

## 2020-04-26 ENCOUNTER — Other Ambulatory Visit: Payer: Self-pay

## 2020-04-26 DIAGNOSIS — Z792 Long term (current) use of antibiotics: Secondary | ICD-10-CM | POA: Diagnosis not present

## 2020-04-26 DIAGNOSIS — Z89411 Acquired absence of right great toe: Secondary | ICD-10-CM

## 2020-04-26 DIAGNOSIS — M216X2 Other acquired deformities of left foot: Secondary | ICD-10-CM

## 2020-04-26 MED ORDER — OXYCODONE-ACETAMINOPHEN 10-325 MG PO TABS: ORAL_TABLET | ORAL | 0 refills | Status: DC

## 2020-04-26 MED ORDER — GABAPENTIN 300 MG PO CAPS: 300.0000 mg | ORAL_CAPSULE | Freq: Three times a day (TID) | ORAL | 3 refills | Status: DC

## 2020-04-26 NOTE — Telephone Encounter (Signed)
Patient states he had surgery. He is out of pain meds. [facility] will not fill [facility] as prescribed. [facility] call [facility] to [facility]. Call patient after you speak to pharmacist at [facility].

## 2020-04-26 NOTE — Progress Notes (Signed)
Rx for Percocet is written for one every 4 hours as Rx by Dr. Samuella Cota on 12/30 -Dr. Marylene Land

## 2020-04-26 NOTE — Telephone Encounter (Signed)
LVM stating patient's PICC line came out on New Year's Eve. Dr. Logan Bores was on call and called in doxycycline to cover him until today. Patient needs order for new PICC line to be reinserted. Patient call back number is [phone].

## 2020-04-26 NOTE — Telephone Encounter (Signed)
The patient said that the pharmacy is not refilling his pain medication because of how it was written.. The pharmacist said you needed to call in 1 or 2 every four hours in order for them to write it.

## 2020-04-26 NOTE — Progress Notes (Signed)
Patient had 40 tabs on 12/30 dispensed and If he was taking it as rx'd he should have more pills left. It is too soon for a refill. He should be eligible for another refill on Wednesday 04/29/19 or his Doctor, Price will have to adjust the Rx. Thanks -Dr. Alexandru Moorer 

## 2020-04-26 NOTE — Telephone Encounter (Signed)
Refill requests for diltiazem and metoprolol and lasix none of which are still on his medication list.   If patient calls refills should be directed to his cardiologist who is following his heart failure

## 2020-04-26 NOTE — Telephone Encounter (Signed)
Patient had 40 tabs on 12/30 dispensed and If he was taking it as rx'd he should have more pills left. It is too soon for a refill. He should be eligible for another refill on Wednesday 04/29/19 or his Doctor, Samuella Cota will have to adjust the Rx. Thanks -Dr. Marylene Land

## 2020-04-26 NOTE — Progress Notes (Signed)
Spoke with patient extensively re: pain mediation. Explained to him that his Rx on 12/30 was written for 1 tab every 4 hours and he is not eligible for a refill until Wednesday on his pain medication. Explained to him that he must take as written and not more than Rx'd. Also sent patient Neurontin to have to supplement pain meds as needed and advised until he can get his rx on Wednesday to tylenol to help with pain. -Dr. Marylene Land

## 2020-04-26 NOTE — Telephone Encounter (Signed)
Pt needs a refill on pain meds has been out since Saturday and Is in a lot of pain. Price pt. Please Advise.Thank you.

## 2020-04-27 ENCOUNTER — Other Ambulatory Visit: Payer: Self-pay

## 2020-04-27 ENCOUNTER — Ambulatory Visit (HOSPITAL_COMMUNITY)
Admission: RE | Admit: 2020-04-27 | Discharge: 2020-04-27 | Disposition: A | Discharge status: Home / Self Care | Payer: Medicare Other | Source: Ambulatory Visit | Attending: Podiatry | Admitting: Podiatry

## 2020-04-27 DIAGNOSIS — M8600 Acute hematogenous osteomyelitis, unspecified site: Secondary | ICD-10-CM | POA: Diagnosis not present

## 2020-04-27 DIAGNOSIS — Z89411 Acquired absence of right great toe: Secondary | ICD-10-CM

## 2020-04-27 DIAGNOSIS — Z452 Encounter for adjustment and management of vascular access device: Secondary | ICD-10-CM | POA: Diagnosis not present

## 2020-04-27 IMAGING — US IR PICC >5YO
2 series · 2 of 2 positions shown · IV contrast (agent unspecified)
Comparison: none

INDICATION: Patient with history of osteomyelitis of foot in need of durable IV
access for long-term IV antibiotics use s/p LUE PICC placement in IR
[DATE], which was inadvertently removed. Request is made for
PICC line placement.

EXAM:
ULTRASOUND AND FLUOROSCOPIC GUIDED PICC LINE INSERTION
MEDICATIONS:
2 mL 1% lidocaine
CONTRAST:  None
FLUOROSCOPY TIME:  18 seconds
COMPLICATIONS:
None immediate.
TECHNIQUE: The procedure, risks, benefits, and alternatives were explained to
the patient and informed written consent was obtained. A timeout was
performed prior to the initiation of the procedure.

[Series 1: ir picc >5yo · 1 of 1 slices shown]
[im 1/1]
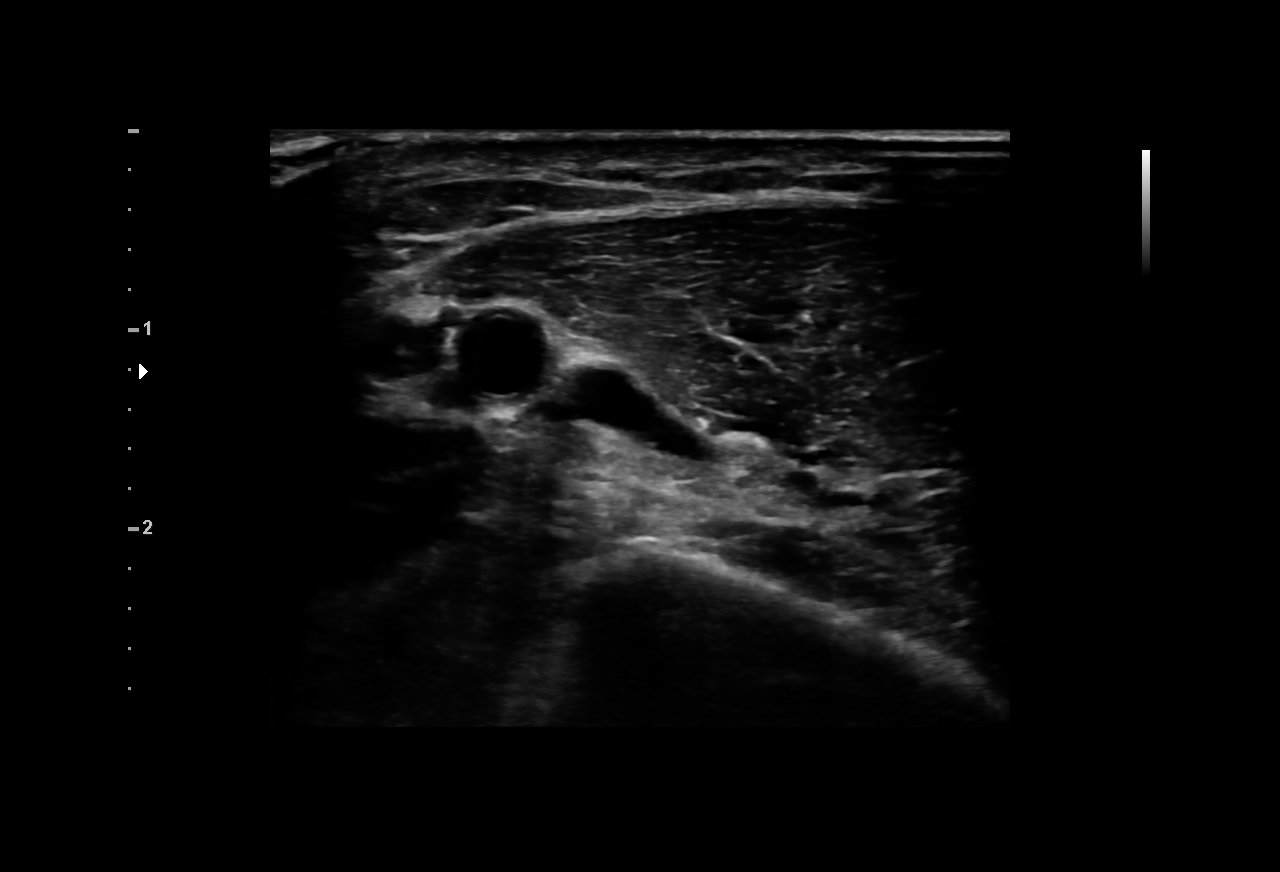

[Series 1: fl (-) angio · 1 of 1 slices shown]
[im 1/1]
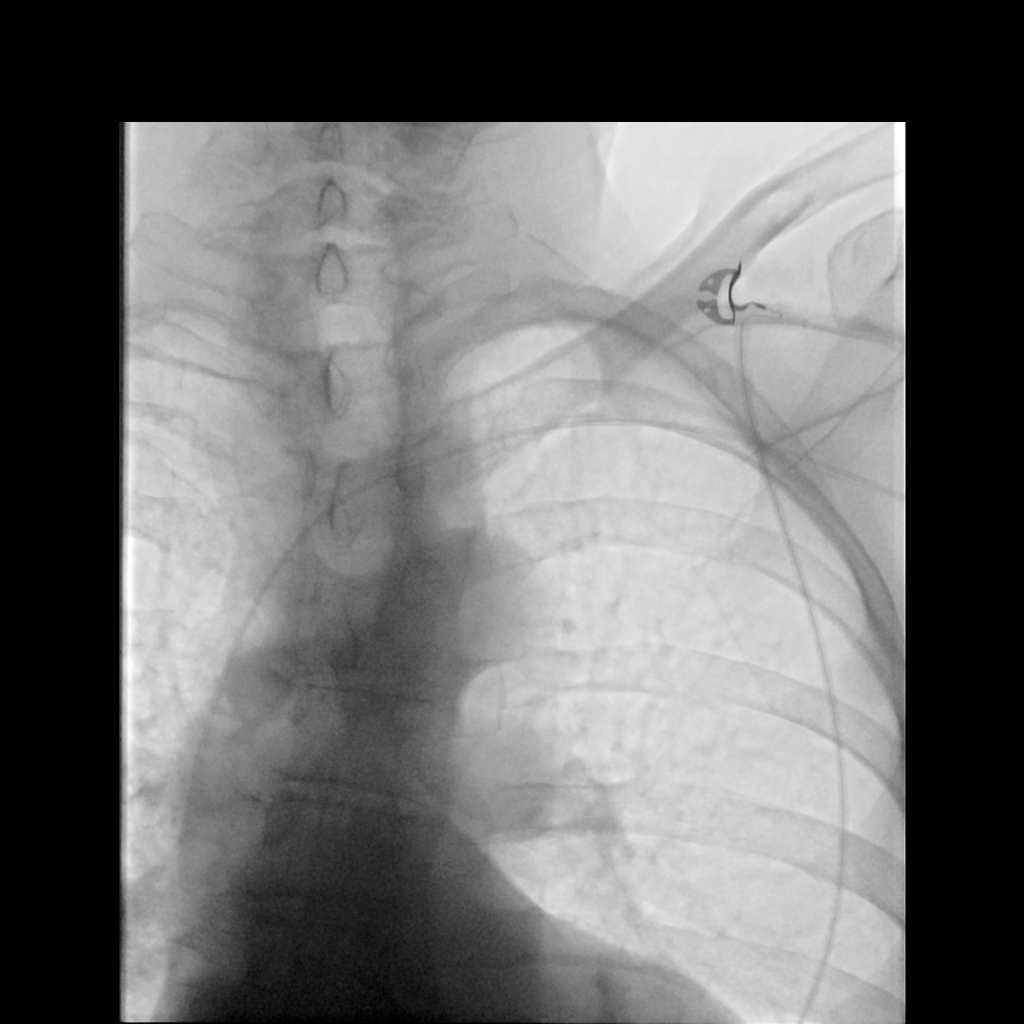

[2 of 2 positions shown; findings below may reference images not displayed]

The left upper extremity was prepped with chlorhexidine in a sterile
fashion, and a sterile drape was applied covering the operative
field. Maximum barrier sterile technique with sterile gowns and
gloves were used for the procedure. A timeout was performed prior to
the initiation of the procedure. Local anesthesia was provided with
1% lidocaine.

Under direct ultrasound guidance, the brachial vein was accessed
with a micropuncture kit after the overlying soft tissues were
anesthetized with 1% lidocaine.

After the overlying soft tissues were anesthetized, a small venotomy
incision was created and a micropuncture kit was utilized to access
the left brachial vein. Real-time ultrasound guidance was utilized
for vascular access including the acquisition of a permanent
ultrasound image documenting patency of the accessed vessel.

A guidewire was advanced to the level of the superior caval-atrial
junction for measurement purposes and the PICC line was cut to
length. A peel-away sheath was placed and a 42 cm, 5 French, single
lumen was inserted to level of the superior caval-atrial junction. A
post procedure spot fluoroscopic was obtained. The catheter easily
aspirated and flushed and was sutured in place. A dressing was
placed. The patient tolerated the procedure well without immediate
post procedural complication.
FINDINGS: After catheter placement, the tip lies within the superior
cavoatrial junction. The catheter aspirates and flushes normally and
is ready for immediate use.
IMPRESSION: Successful ultrasound and fluoroscopic guided placement of a left
brachial vein approach, 42 cm, 5 French, single lumen PICC with tip
at the superior caval-atrial junction. The PICC line is ready for
immediate use.

## 2020-04-27 MED ORDER — LIDOCAINE HCL 1 % IJ SOLN
INTRAMUSCULAR | Status: DC | PRN
  Administered 2020-04-27: 10 mL

## 2020-04-27 MED ORDER — LIDOCAINE HCL 1 % IJ SOLN
INTRAMUSCULAR | Status: AC
  Filled 2020-04-27: qty 20

## 2020-04-27 NOTE — Procedures (Signed)
PROCEDURE SUMMARY:  Successful placement of image-guided single lumen PICC line to the left brachial vein. Length 42 cm. Tip at lower SVC/RA. No complications. EBL <1 mL. Ready for use.  Please see imaging section of Epic for full dictation.   Azavier Creson PA-C 04/27/2020 10:14 AM

## 2020-04-27 NOTE — Addendum Note (Signed)
Addended by: Nicholes Rough on: 04/27/2020 10:25 AM   Modules accepted: Orders

## 2020-04-27 NOTE — Telephone Encounter (Signed)
I placed the order for PICC line.  However it seems like infectious disease might be managing the PICC line.  Clarita Crane can you make sure that I placed the order right and if that is all I need to do to get the PICC line

## 2020-04-28 ENCOUNTER — Ambulatory Visit: Payer: Medicare Other | Admitting: Podiatry

## 2020-04-28 ENCOUNTER — Other Ambulatory Visit: Payer: Medicare Other

## 2020-04-28 DIAGNOSIS — L97522 Non-pressure chronic ulcer of other part of left foot with fat layer exposed: Secondary | ICD-10-CM | POA: Diagnosis not present

## 2020-04-29 ENCOUNTER — Encounter: Payer: Self-pay | Admitting: Podiatry

## 2020-04-29 DIAGNOSIS — E785 Hyperlipidemia, unspecified: Secondary | ICD-10-CM | POA: Diagnosis not present

## 2020-04-29 DIAGNOSIS — I5022 Chronic systolic (congestive) heart failure: Secondary | ICD-10-CM | POA: Diagnosis not present

## 2020-04-29 DIAGNOSIS — L97526 Non-pressure chronic ulcer of other part of left foot with bone involvement without evidence of necrosis: Secondary | ICD-10-CM | POA: Diagnosis not present

## 2020-04-29 DIAGNOSIS — I4892 Unspecified atrial flutter: Secondary | ICD-10-CM | POA: Diagnosis not present

## 2020-04-29 DIAGNOSIS — G629 Polyneuropathy, unspecified: Secondary | ICD-10-CM | POA: Diagnosis not present

## 2020-04-29 DIAGNOSIS — I110 Hypertensive heart disease with heart failure: Secondary | ICD-10-CM | POA: Diagnosis not present

## 2020-04-29 DIAGNOSIS — F40240 Claustrophobia: Secondary | ICD-10-CM | POA: Diagnosis not present

## 2020-04-29 DIAGNOSIS — R7303 Prediabetes: Secondary | ICD-10-CM | POA: Diagnosis not present

## 2020-04-29 DIAGNOSIS — D649 Anemia, unspecified: Secondary | ICD-10-CM | POA: Diagnosis not present

## 2020-04-29 DIAGNOSIS — L97522 Non-pressure chronic ulcer of other part of left foot with fat layer exposed: Secondary | ICD-10-CM | POA: Diagnosis not present

## 2020-04-29 DIAGNOSIS — M159 Polyosteoarthritis, unspecified: Secondary | ICD-10-CM | POA: Diagnosis not present

## 2020-04-29 DIAGNOSIS — S72001D Fracture of unspecified part of neck of right femur, subsequent encounter for closed fracture with routine healing: Secondary | ICD-10-CM | POA: Diagnosis not present

## 2020-04-29 DIAGNOSIS — G8929 Other chronic pain: Secondary | ICD-10-CM | POA: Diagnosis not present

## 2020-04-29 DIAGNOSIS — I429 Cardiomyopathy, unspecified: Secondary | ICD-10-CM | POA: Diagnosis not present

## 2020-04-29 DIAGNOSIS — W19XXXD Unspecified fall, subsequent encounter: Secondary | ICD-10-CM | POA: Diagnosis not present

## 2020-04-29 DIAGNOSIS — M069 Rheumatoid arthritis, unspecified: Secondary | ICD-10-CM | POA: Diagnosis not present

## 2020-04-29 NOTE — Progress Notes (Signed)
Subjective:  Patient ID: Thomas [name], male    DOB: 07-27-1950,  MRN: [id number]  Chief Complaint  Patient presents with  . Foot Ulcer    Left foot ulcer to the side of the 5th toe.    DOS: 03/26/2020 Procedure: Left foot incision and drainage with removal of all nonviable tissue second and fifth metatarsal and right third digit amputation  70 y.o. male returns for post-op check.  Patient presents with follow-up of to her undergoing a listed above procedure.  Patient states he is doing well.  He has some pain.  He has been ambulating with bilateral surgical shoes.  He denies any other acute complaints.  Review of Systems: Negative except as noted in the HPI. Denies N/V/F/Ch.  Past Medical History:  Diagnosis Date  . Atrial flutter ([place]) 01/2020  . Cancer [facility])    prostate  . Claustrophobia    quite severe  . Heart failure with reduced ejection fraction ([place])   . Hypertension   . Hypoglycemia    occ  . Left knee DJD    Xray 12/23/08  . NICM (nonischemic cardiomyopathy) ([place]) 02/12/2020  . Prostate cancer [facility])     Current Outpatient Medications:  .  amiodarone (PACERONE) 200 MG tablet, Take 1 tablet (200 mg total) by mouth daily., Disp: 90 tablet, Rfl: 3 .  apixaban (ELIQUIS) 5 MG TABS tablet, Take 1 tablet (5 mg total) by mouth 2 (two) times daily., Disp: 60 tablet, Rfl: 6 .  atorvastatin (LIPITOR) 40 MG tablet, Take 1 tablet (40 mg total) by mouth daily., Disp: 90 tablet, Rfl: 3 .  ceFAZolin (ANCEF) IVPB, Inject 2 g into the vein every 8 (eight) hours. Indication:  Osteomyelitis First Dose: No Last Day of Therapy:  05/08/2020 Labs - Once weekly:  CBC/D and BMP, Labs - Every other week:  ESR and CRP Method of administration: IV Push Method of administration may be changed at the discretion of home infusion pharmacist based upon assessment of the patient and/or caregiver's ability to self-administer the medication ordered., Disp: 120 Units, Rfl: 0 .  DULoxetine (CYMBALTA) 60 MG  capsule, Take 1 capsule (60 mg total) by mouth daily., Disp: 90 capsule, Rfl: 1 .  ENBREL SURECLICK 50 MG/ML injection, Inject 25 mg into the skin once a week. , Disp: , Rfl:  .  gabapentin (NEURONTIN) 300 MG capsule, Take 1 capsule (300 mg total) by mouth 3 (three) times daily. To help with pain that is not relieved by pain medicatoin, Disp: 90 capsule, Rfl: 3 .  lisinopril (ZESTRIL) 10 MG tablet, Take 1 tablet (10 mg total) by mouth daily., Disp: 90 tablet, Rfl: 3 .  Melatonin 10 MG TABS, Take 10 mg by mouth at bedtime., Disp: , Rfl:  .  methotrexate (RHEUMATREX) 2.5 MG tablet, Take 20 mg by mouth once a week. , Disp: , Rfl:  .  NARCAN 4 MG/0.1ML LIQD nasal spray kit, Place 1 spray into the nose as needed (as directed for emergency)., Disp: 2 each, Rfl: 0 .  oxyCODONE (ROXICODONE) 15 MG immediate release tablet, Take 1 tablet (15 mg total) by mouth every 4 (four) hours as needed for pain., Disp: 20 tablet, Rfl: 0 .  oxyCODONE-acetaminophen (PERCOCET) 10-325 MG tablet, 1-2 tablets as needed every 4 hours for pain. This prescription must last 1 week. Do not take more than prescribed., Disp: 42 tablet, Rfl: 0 .  potassium chloride SA (KLOR-CON M20) 20 MEQ tablet, Take 2 tablets (40 mEq total) by mouth daily.,  Disp: 180 tablet, Rfl: 3 .  predniSONE (DELTASONE) 10 MG tablet, Take 10 mg by mouth daily with breakfast. , Disp: , Rfl:  .  tamsulosin (FLOMAX) 0.4 MG CAPS capsule, Take 0.4 mg by mouth daily. , Disp: , Rfl:  .  torsemide (DEMADEX) 20 MG tablet, Take 1 tablet (20 mg total) by mouth daily., Disp: 90 tablet, Rfl: 3  Social History   Tobacco Use  Smoking Status Never Smoker  Smokeless Tobacco Never Used    Allergies  Allergen Reactions  . Chlorthalidone Other (See Comments)    "Makes me light-headed and I don't like the way it makes me feel"  . Voltaren [Diclofenac] Rash   Objective:  There were no vitals filed for this visit. There is no height or weight on file to calculate  BMI. Constitutional Well developed. Well nourished.  Vascular Foot warm and well perfused. Capillary refill normal to all digits.   Neurologic Normal speech. Oriented to person, place, and time. Epicritic sensation to light touch grossly present bilaterally.  Dermatologic Skin healing well without signs of infection. Skin edges well coapted without signs of infection.  Orthopedic:  No tenderness to palpation noted about the surgical site.   Radiographs: 3 views of skeletally mature adult bilateral foot amputation noted to the right third digit.  No osteomyelitis noted.  No other bony abnormalities noted.  No new changes from previous x-ray noted.  Assessment:   1. Foot ulcer with fat layer exposed, left ([place])    Plan:  Patient was evaluated and treated and all questions answered.  S/p foot surgery bilaterally -Progressing as expected post-operatively. -XR: See above -WB Status: Weightbearing as tolerated in cam boot -Sutures: Removed.  No signs of dehiscence noted no clinical signs of infection noted. -Medications: None -Antibiotics per infectious disease -Overall he is healing really well.  He has been off of his foot and not working as well which may have helped with the healing a lot.  I encouraged him to continue staying off of the foot is much as possible.  Patient states understanding  Left lateral fifth metatarsal head ulceration with fat layer exposed -Debridement as below. -Dressed with Neosporin and Band-Aid, DSD. -Continue off-loading with surgical shoe.  Procedure: Excisional Debridement of Wound Tool: Sharp chisel blade/tissue nipper Rationale: Removal of non-viable soft tissue from the wound to promote healing.  Anesthesia: none Pre-Debridement Wound Measurements: 0.6 cm x 0.4 cm x 0.3 cm  Post-Debridement Wound Measurements: 0.7 cm x 0.4 cm x 0.3 cm  Type of Debridement: Sharp Excisional Tissue Removed: Non-viable soft tissue Blood loss: Minimal (<50cc) Depth  of Debridement: subcutaneous tissue. Technique: Sharp excisional debridement to bleeding, viable wound base.  Wound Progress: The wound appears to be decreasing however we will continue to monitor the progression of Site healing conversation 7 Dressing: Dry, sterile, compression dressing. Disposition: Patient tolerated procedure well. Patient to return in 1 week for follow-up.  No follow-ups on file.     No follow-ups on file.

## 2020-04-30 NOTE — Telephone Encounter (Signed)
Please advise 

## 2020-05-03 ENCOUNTER — Telehealth: Payer: Self-pay | Admitting: Sports Medicine

## 2020-05-03 ENCOUNTER — Telehealth: Payer: Self-pay | Admitting: Podiatry

## 2020-05-03 DIAGNOSIS — M868X7 Other osteomyelitis, ankle and foot: Secondary | ICD-10-CM | POA: Diagnosis not present

## 2020-05-03 DIAGNOSIS — M86171 Other acute osteomyelitis, right ankle and foot: Secondary | ICD-10-CM | POA: Diagnosis not present

## 2020-05-03 NOTE — Telephone Encounter (Signed)
Patient has requested refill on pain meds to hold over until appointment with pain management, Please advise

## 2020-05-03 NOTE — Telephone Encounter (Signed)
Patient called in requesting refill on pain meds stating Dr.Stover. I sent the message over to [facility] but she stated I needed to send it over to you. Patient has seen Burlene Arnt and March Rummage. The patient has been referred out to pain management but the patient has requested pain meds to hold off until pain management starts on 05/11/20, Please Advise

## 2020-05-03 NOTE — Telephone Encounter (Signed)
Patient stated if you call in 10/325 please make sure the instructions read. 1 or 2 every 4 hours as needed with pain. Patient states if it reads anything different, they may deny filling prescription, Please Advise

## 2020-05-04 ENCOUNTER — Emergency Department (HOSPITAL_COMMUNITY): Payer: Medicare Other

## 2020-05-04 ENCOUNTER — Inpatient Hospital Stay: Payer: Medicare Other | Admitting: Infectious Diseases

## 2020-05-04 ENCOUNTER — Ambulatory Visit: Payer: Self-pay

## 2020-05-04 ENCOUNTER — Other Ambulatory Visit: Payer: Self-pay

## 2020-05-04 ENCOUNTER — Inpatient Hospital Stay (HOSPITAL_COMMUNITY)
Admission: EM | Admit: 2020-05-04 | Discharge: 2020-05-07 | DRG: 481 | Disposition: A | Discharge status: Home Health | Payer: Medicare Other | Attending: Family Medicine | Admitting: Family Medicine

## 2020-05-04 ENCOUNTER — Encounter (HOSPITAL_COMMUNITY): Payer: Self-pay

## 2020-05-04 DIAGNOSIS — R0689 Other abnormalities of breathing: Secondary | ICD-10-CM | POA: Diagnosis not present

## 2020-05-04 DIAGNOSIS — Z043 Encounter for examination and observation following other accident: Secondary | ICD-10-CM | POA: Diagnosis not present

## 2020-05-04 DIAGNOSIS — Z89421 Acquired absence of other right toe(s): Secondary | ICD-10-CM

## 2020-05-04 DIAGNOSIS — I428 Other cardiomyopathies: Secondary | ICD-10-CM | POA: Diagnosis not present

## 2020-05-04 DIAGNOSIS — Z7952 Long term (current) use of systemic steroids: Secondary | ICD-10-CM

## 2020-05-04 DIAGNOSIS — R52 Pain, unspecified: Secondary | ICD-10-CM | POA: Diagnosis not present

## 2020-05-04 DIAGNOSIS — S72009A Fracture of unspecified part of neck of unspecified femur, initial encounter for closed fracture: Secondary | ICD-10-CM | POA: Diagnosis not present

## 2020-05-04 DIAGNOSIS — Q211 Atrial septal defect: Secondary | ICD-10-CM | POA: Diagnosis not present

## 2020-05-04 DIAGNOSIS — S72141A Displaced intertrochanteric fracture of right femur, initial encounter for closed fracture: Secondary | ICD-10-CM | POA: Diagnosis not present

## 2020-05-04 DIAGNOSIS — I5042 Chronic combined systolic (congestive) and diastolic (congestive) heart failure: Secondary | ICD-10-CM

## 2020-05-04 DIAGNOSIS — M86179 Other acute osteomyelitis, unspecified ankle and foot: Secondary | ICD-10-CM | POA: Diagnosis not present

## 2020-05-04 DIAGNOSIS — G4700 Insomnia, unspecified: Secondary | ICD-10-CM | POA: Diagnosis present

## 2020-05-04 DIAGNOSIS — Z7901 Long term (current) use of anticoagulants: Secondary | ICD-10-CM | POA: Diagnosis not present

## 2020-05-04 DIAGNOSIS — I110 Hypertensive heart disease with heart failure: Secondary | ICD-10-CM | POA: Diagnosis present

## 2020-05-04 DIAGNOSIS — F32A Depression, unspecified: Secondary | ICD-10-CM | POA: Diagnosis not present

## 2020-05-04 DIAGNOSIS — Y9301 Activity, walking, marching and hiking: Secondary | ICD-10-CM | POA: Diagnosis present

## 2020-05-04 DIAGNOSIS — M160 Bilateral primary osteoarthritis of hip: Secondary | ICD-10-CM | POA: Diagnosis present

## 2020-05-04 DIAGNOSIS — I5022 Chronic systolic (congestive) heart failure: Secondary | ICD-10-CM | POA: Diagnosis not present

## 2020-05-04 DIAGNOSIS — I420 Dilated cardiomyopathy: Secondary | ICD-10-CM | POA: Diagnosis not present

## 2020-05-04 DIAGNOSIS — I5021 Acute systolic (congestive) heart failure: Secondary | ICD-10-CM | POA: Diagnosis not present

## 2020-05-04 DIAGNOSIS — I4891 Unspecified atrial fibrillation: Secondary | ICD-10-CM | POA: Diagnosis not present

## 2020-05-04 DIAGNOSIS — I10 Essential (primary) hypertension: Secondary | ICD-10-CM | POA: Diagnosis not present

## 2020-05-04 DIAGNOSIS — S72002A Fracture of unspecified part of neck of left femur, initial encounter for closed fracture: Secondary | ICD-10-CM | POA: Diagnosis not present

## 2020-05-04 DIAGNOSIS — I4892 Unspecified atrial flutter: Secondary | ICD-10-CM | POA: Diagnosis present

## 2020-05-04 DIAGNOSIS — Z96653 Presence of artificial knee joint, bilateral: Secondary | ICD-10-CM | POA: Diagnosis present

## 2020-05-04 DIAGNOSIS — B9561 Methicillin susceptible Staphylococcus aureus infection as the cause of diseases classified elsewhere: Secondary | ICD-10-CM | POA: Diagnosis not present

## 2020-05-04 DIAGNOSIS — M86171 Other acute osteomyelitis, right ankle and foot: Secondary | ICD-10-CM | POA: Diagnosis not present

## 2020-05-04 DIAGNOSIS — Z8546 Personal history of malignant neoplasm of prostate: Secondary | ICD-10-CM

## 2020-05-04 DIAGNOSIS — Z20822 Contact with and (suspected) exposure to covid-19: Secondary | ICD-10-CM | POA: Diagnosis present

## 2020-05-04 DIAGNOSIS — T148XXA Other injury of unspecified body region, initial encounter: Secondary | ICD-10-CM

## 2020-05-04 DIAGNOSIS — D62 Acute posthemorrhagic anemia: Secondary | ICD-10-CM | POA: Diagnosis not present

## 2020-05-04 DIAGNOSIS — Z8042 Family history of malignant neoplasm of prostate: Secondary | ICD-10-CM

## 2020-05-04 DIAGNOSIS — I081 Rheumatic disorders of both mitral and tricuspid valves: Secondary | ICD-10-CM | POA: Diagnosis present

## 2020-05-04 DIAGNOSIS — M810 Age-related osteoporosis without current pathological fracture: Secondary | ICD-10-CM | POA: Diagnosis present

## 2020-05-04 DIAGNOSIS — Z79899 Other long term (current) drug therapy: Secondary | ICD-10-CM | POA: Diagnosis not present

## 2020-05-04 DIAGNOSIS — M069 Rheumatoid arthritis, unspecified: Secondary | ICD-10-CM | POA: Diagnosis present

## 2020-05-04 DIAGNOSIS — F40240 Claustrophobia: Secondary | ICD-10-CM | POA: Diagnosis not present

## 2020-05-04 DIAGNOSIS — W010XXA Fall on same level from slipping, tripping and stumbling without subsequent striking against object, initial encounter: Secondary | ICD-10-CM | POA: Diagnosis present

## 2020-05-04 DIAGNOSIS — M818 Other osteoporosis without current pathological fracture: Secondary | ICD-10-CM | POA: Diagnosis not present

## 2020-05-04 DIAGNOSIS — I482 Chronic atrial fibrillation, unspecified: Secondary | ICD-10-CM | POA: Diagnosis not present

## 2020-05-04 DIAGNOSIS — Z419 Encounter for procedure for purposes other than remedying health state, unspecified: Secondary | ICD-10-CM

## 2020-05-04 DIAGNOSIS — Z888 Allergy status to other drugs, medicaments and biological substances status: Secondary | ICD-10-CM | POA: Diagnosis not present

## 2020-05-04 DIAGNOSIS — R262 Difficulty in walking, not elsewhere classified: Secondary | ICD-10-CM | POA: Diagnosis present

## 2020-05-04 DIAGNOSIS — Z01810 Encounter for preprocedural cardiovascular examination: Secondary | ICD-10-CM | POA: Diagnosis not present

## 2020-05-04 DIAGNOSIS — W19XXXA Unspecified fall, initial encounter: Secondary | ICD-10-CM | POA: Diagnosis not present

## 2020-05-04 DIAGNOSIS — S72001A Fracture of unspecified part of neck of right femur, initial encounter for closed fracture: Secondary | ICD-10-CM | POA: Diagnosis not present

## 2020-05-04 DIAGNOSIS — Z89411 Acquired absence of right great toe: Secondary | ICD-10-CM

## 2020-05-04 LAB — CBC WITH DIFFERENTIAL/PLATELET
Abs Immature Granulocytes: 0.03 10*3/uL (ref 0.00–0.07)
Basophils Absolute: 0 10*3/uL (ref 0.0–0.1)
Basophils Relative: 0 %
Eosinophils Absolute: 0.1 10*3/uL (ref 0.0–0.5)
Eosinophils Relative: 1 %
HCT: 38.3 % — ABNORMAL LOW (ref 39.0–52.0)
Hemoglobin: 12.2 g/dL — ABNORMAL LOW (ref 13.0–17.0)
Immature Granulocytes: 0 %
Lymphocytes Relative: 8 %
Lymphs Abs: 0.6 10*3/uL — ABNORMAL LOW (ref 0.7–4.0)
MCH: 27.3 pg (ref 26.0–34.0)
MCHC: 31.9 g/dL (ref 30.0–36.0)
MCV: 85.7 fL (ref 80.0–100.0)
Monocytes Absolute: 0.9 10*3/uL (ref 0.1–1.0)
Monocytes Relative: 11 %
Neutro Abs: 6.4 10*3/uL (ref 1.7–7.7)
Neutrophils Relative %: 80 %
Platelets: 193 10*3/uL (ref 150–400)
RBC: 4.47 MIL/uL (ref 4.22–5.81)
RDW: 19.6 % — ABNORMAL HIGH (ref 11.5–15.5)
WBC: 8.1 10*3/uL (ref 4.0–10.5)
nRBC: 0 % (ref 0.0–0.2)

## 2020-05-04 LAB — TYPE AND SCREEN
ABO/RH(D): O NEG
Antibody Screen: NEGATIVE

## 2020-05-04 LAB — RESP PANEL BY RT-PCR (FLU A&B, COVID) ARPGX2
Influenza A by PCR: NEGATIVE
Influenza B by PCR: NEGATIVE
SARS Coronavirus 2 by RT PCR: NEGATIVE

## 2020-05-04 LAB — BASIC METABOLIC PANEL
Anion gap: 11 (ref 5–15)
BUN: 16 mg/dL (ref 8–23)
CO2: 26 mmol/L (ref 22–32)
Calcium: 9.1 mg/dL (ref 8.9–10.3)
Chloride: 100 mmol/L (ref 98–111)
Creatinine, Ser: 0.65 mg/dL (ref 0.61–1.24)
GFR, Estimated: >60 mL/min (ref >60)
Glucose, Bld: 110 mg/dL — ABNORMAL HIGH (ref 70–99)
Potassium: 3.5 mmol/L (ref 3.5–5.1)
Sodium: 137 mmol/L (ref 135–145)

## 2020-05-04 LAB — PROTIME-INR
INR: 1.1 (ref 0.8–1.2)
Prothrombin Time: 14.1 seconds (ref 11.4–15.2)

## 2020-05-04 LAB — SURGICAL PCR SCREEN
MRSA, PCR: NEGATIVE
Staphylococcus aureus: NEGATIVE

## 2020-05-04 IMAGING — DX DG HIP (WITH OR WITHOUT PELVIS) 2-3V*R*
3 series · 3 of 3 positions shown · non-contrast
Comparison: [DATE]

CLINICAL DATA: Severe right hip pain status post fall.

EXAM:
DG HIP (WITH OR WITHOUT PELVIS) 2-3V RIGHT

[pelvis ap]
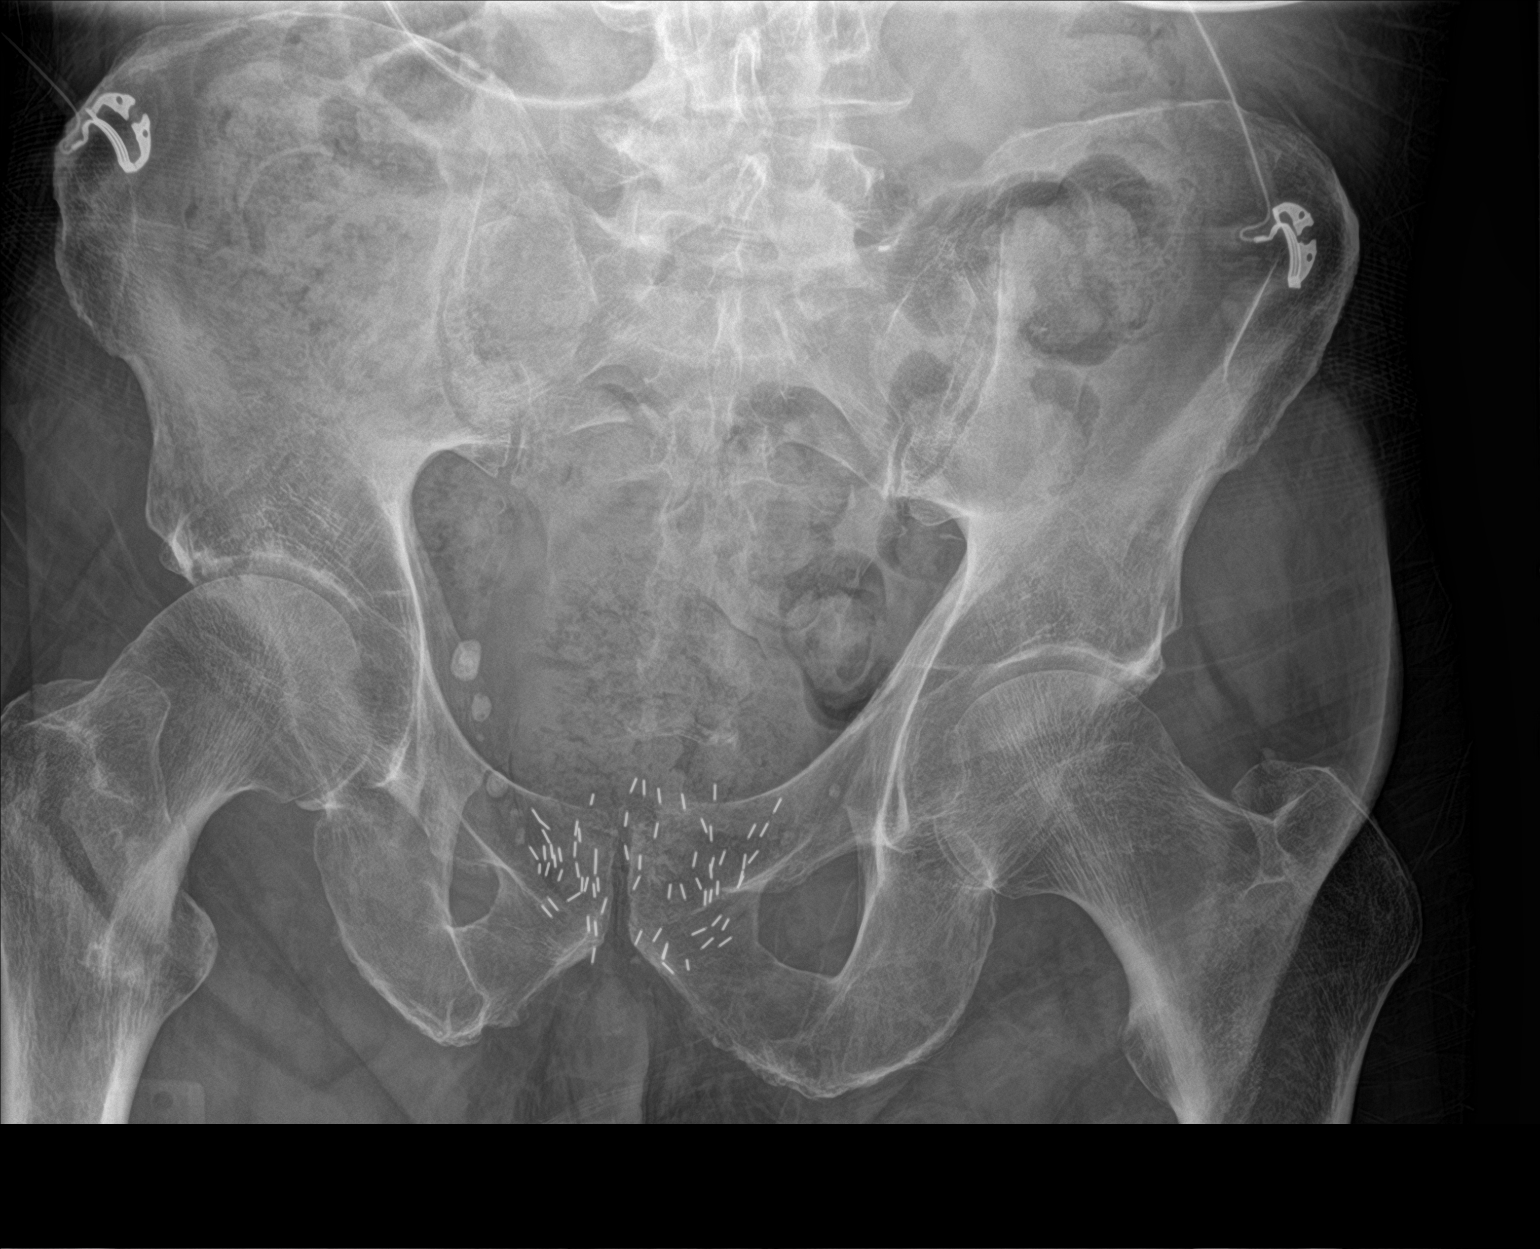

[hip ap]
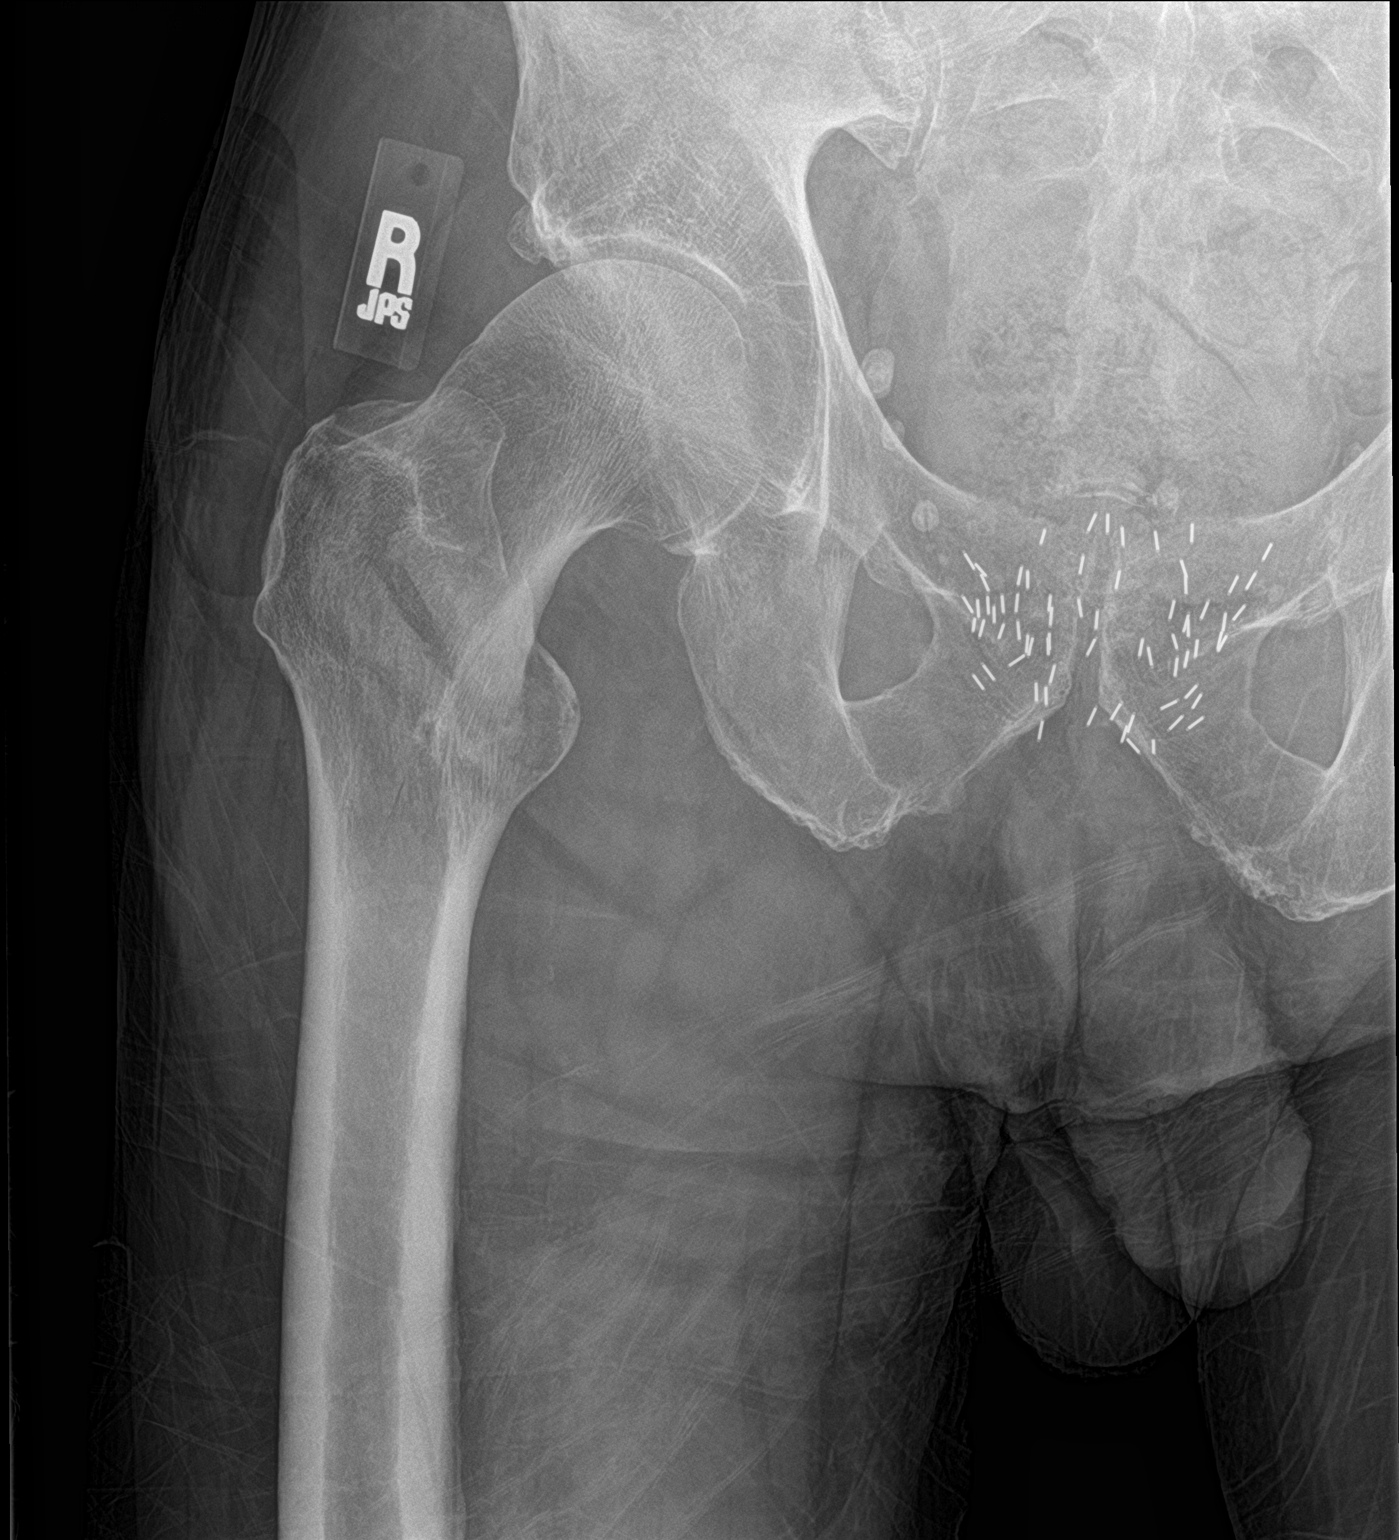

[hip lat]
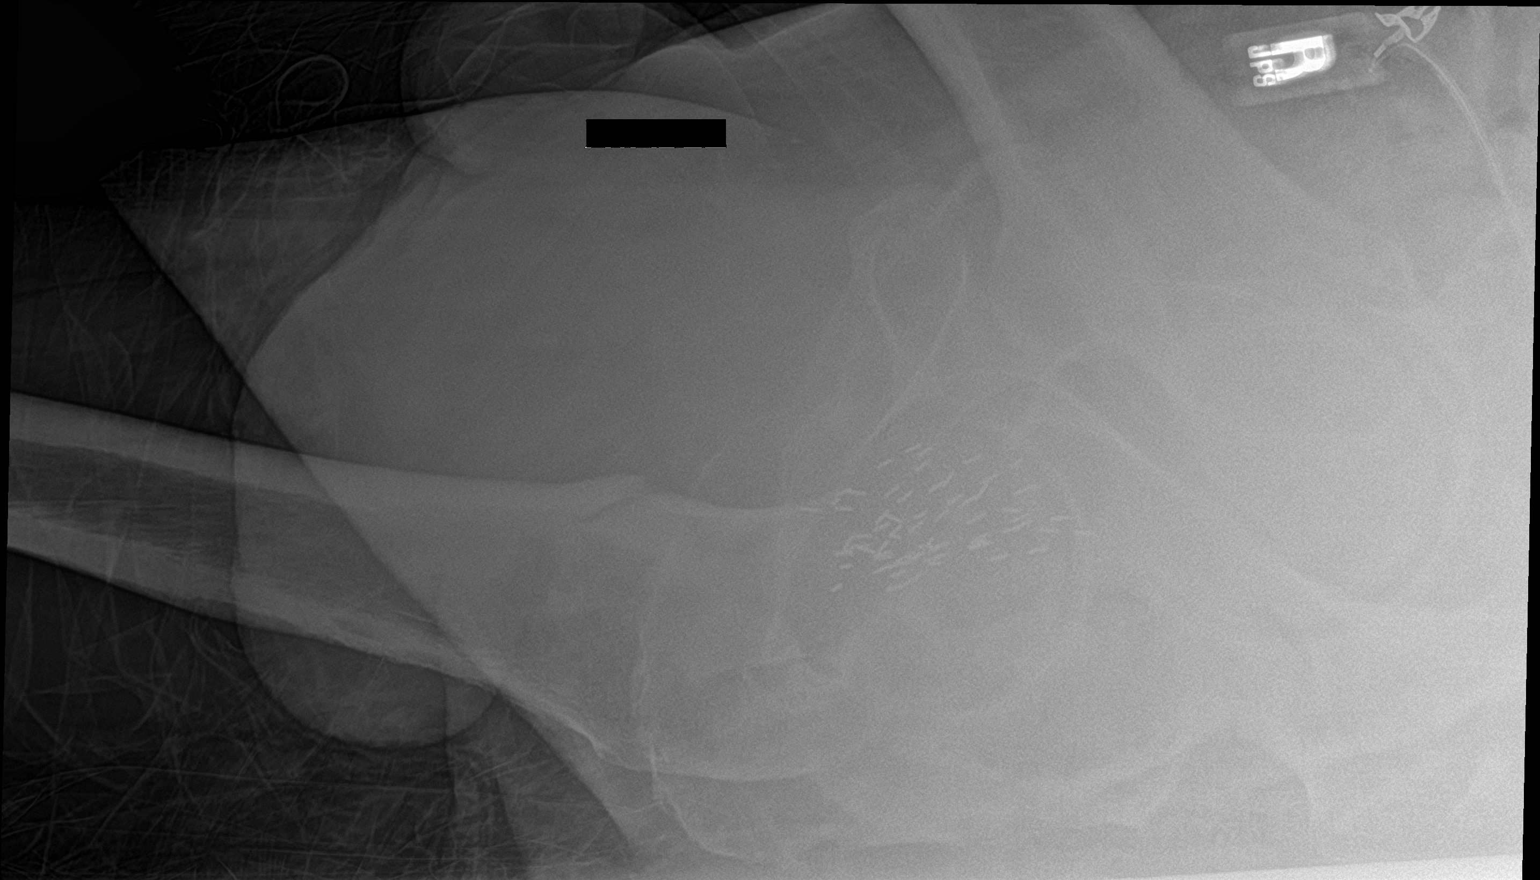

[3 of 3 positions shown; findings below may reference images not displayed]

FINDINGS: Mildly displaced right intertrochanteric hip fracture.

Mild spurring and joint space loss in the right hip. Radiation seeds
noted within the prostate is is. He is
IMPRESSION: Mildly displaced right trochanteric hip fracture.

## 2020-05-04 IMAGING — CT CT CERVICAL SPINE W/O CM
3 of 4 series · 13 of 33 positions shown, 16 images · non-contrast
Comparison: [DATE].

CLINICAL DATA: Fall yesterday.

EXAM:
CT HEAD WITHOUT CONTRAST
CT CERVICAL SPINE WITHOUT CONTRAST
TECHNIQUE: Multidetector CT imaging of the head and cervical spine was
performed following the standard protocol without intravenous
contrast. Multiplanar CT image reconstructions of the cervical spine
were also generated.

[Series 4: c_spine 2.0 orthogonals · axial · 0.21mm/px · z∈[-338,-190]mm · 5 of 101 slices shown, 7 images]
[im 17/101  soft-tissue]
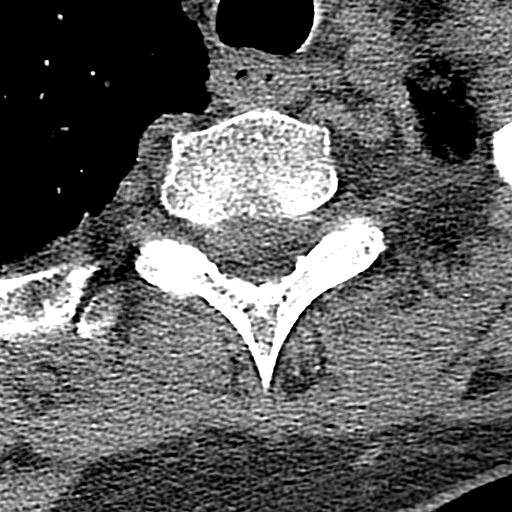
[im 17/101  bone]
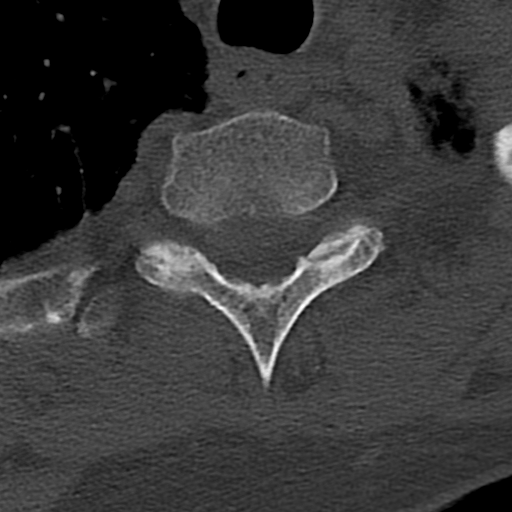
[im 34/101  bone]
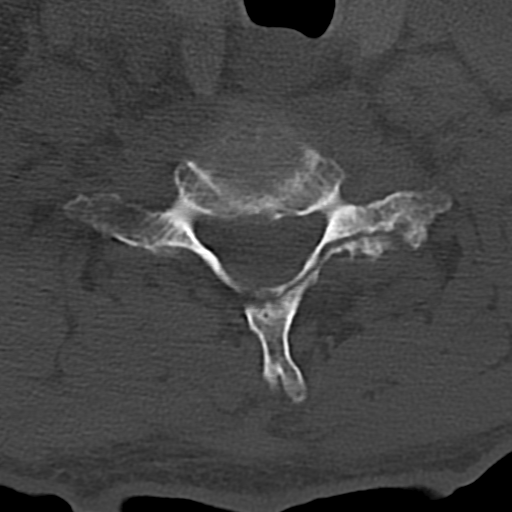
[im 51/101  bone]
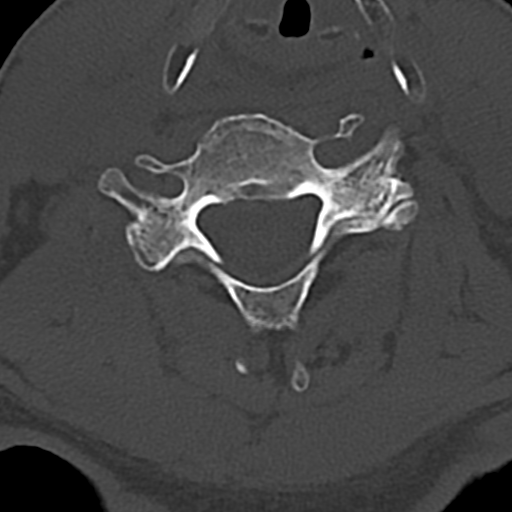
[im 67/101  bone]
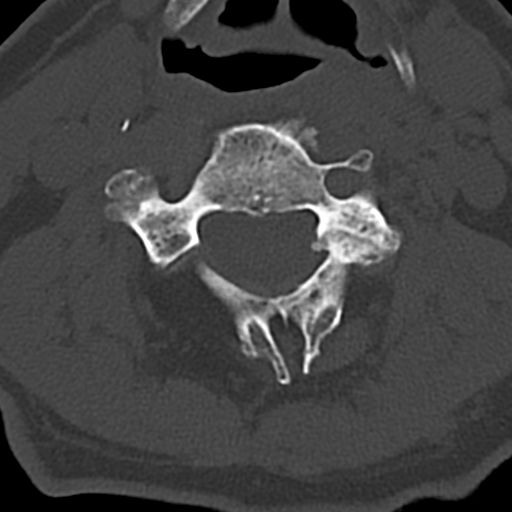
[im 84/101  soft-tissue]
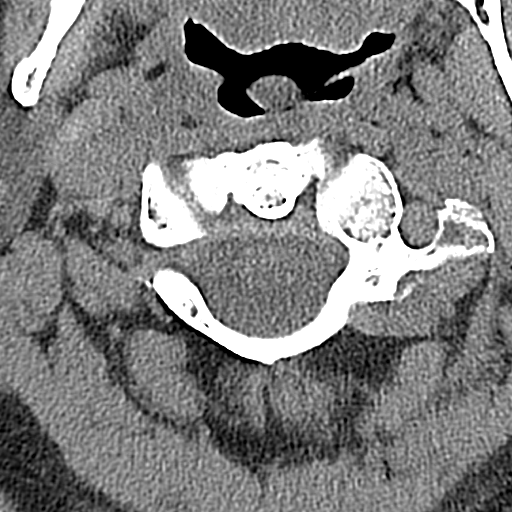
[im 84/101  bone]
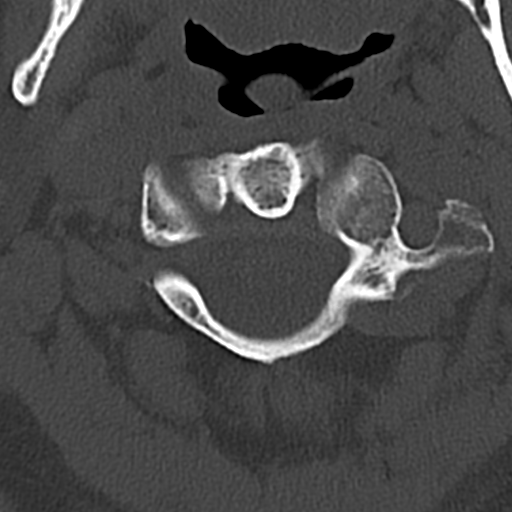

[Series 9: c_spine 2.0 sag bone · sagittal · 0.29mm/px · 5 of 52 slices shown, 6 images]
[im 18/52  bone]
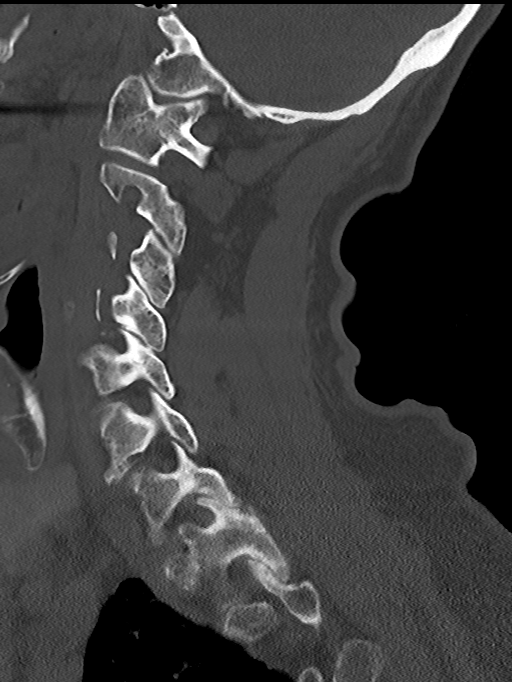
[im 22/52  bone]
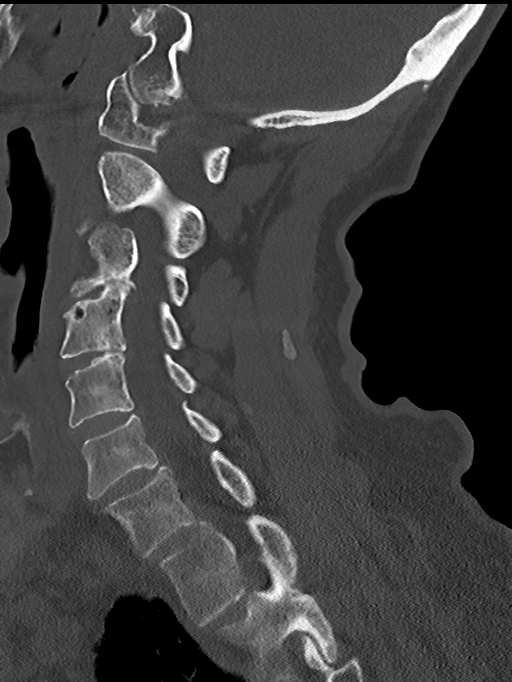
[im 26/52  soft-tissue]
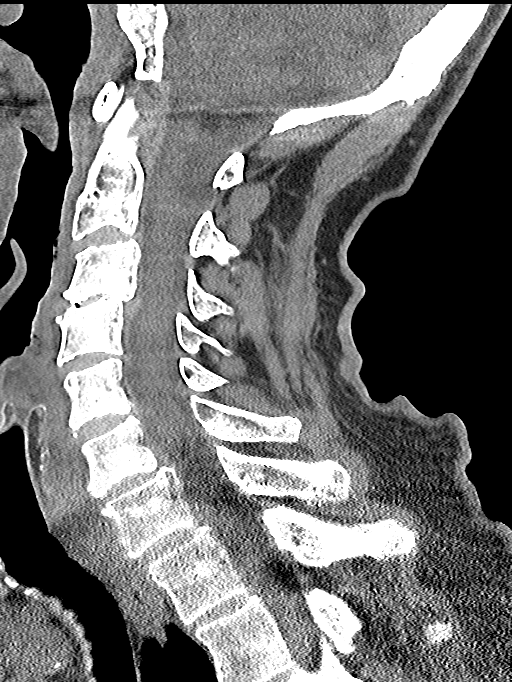
[im 26/52  bone]
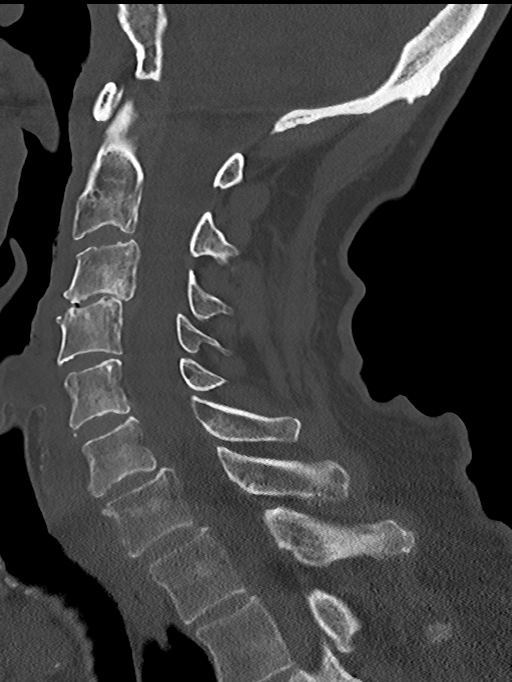
[im 30/52  bone]
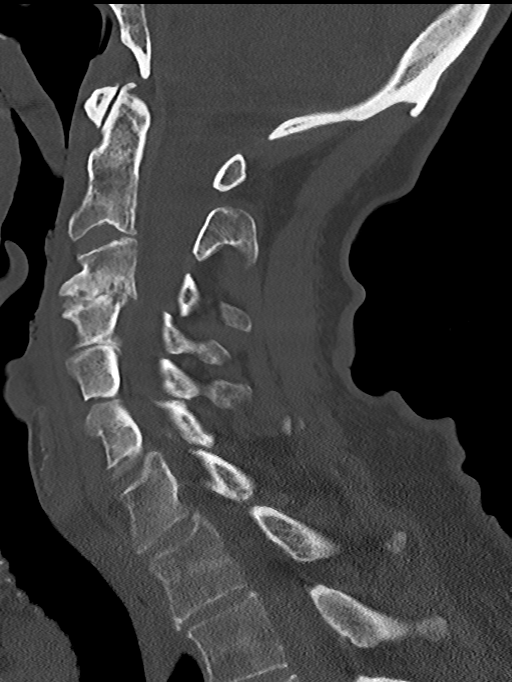
[im 35/52  bone]
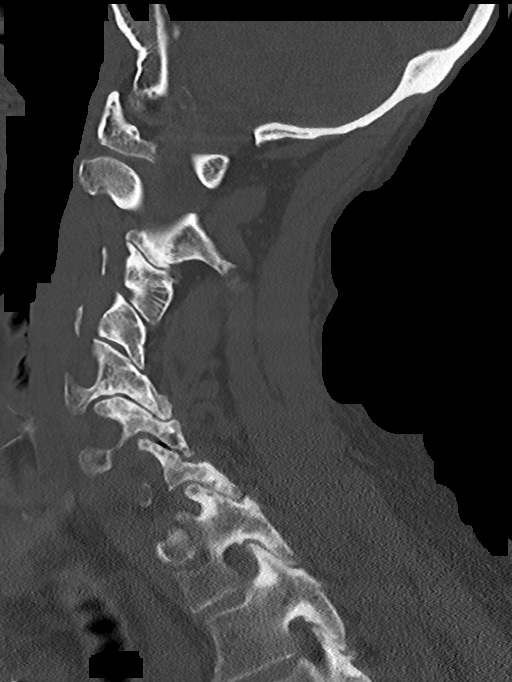

[Series 10: c_spine 2.0 cor bone · coronal · 0.26mm/px · 3 of 61 slices shown]
[im 13/61  bone]
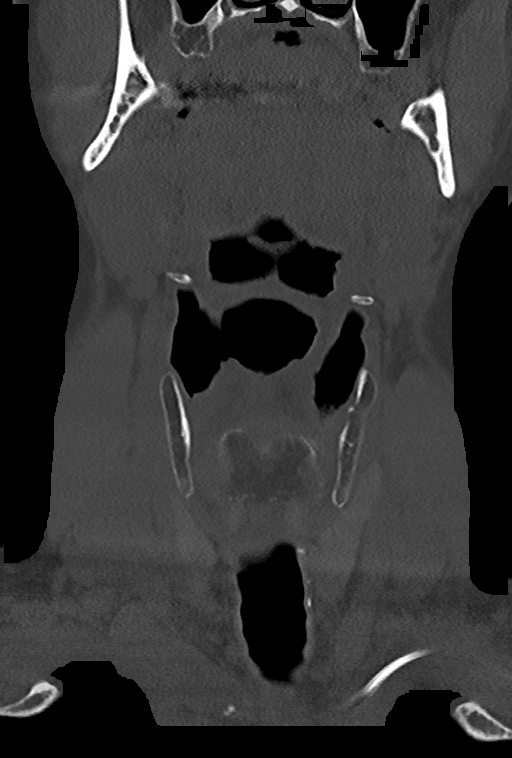
[im 25/61  bone]
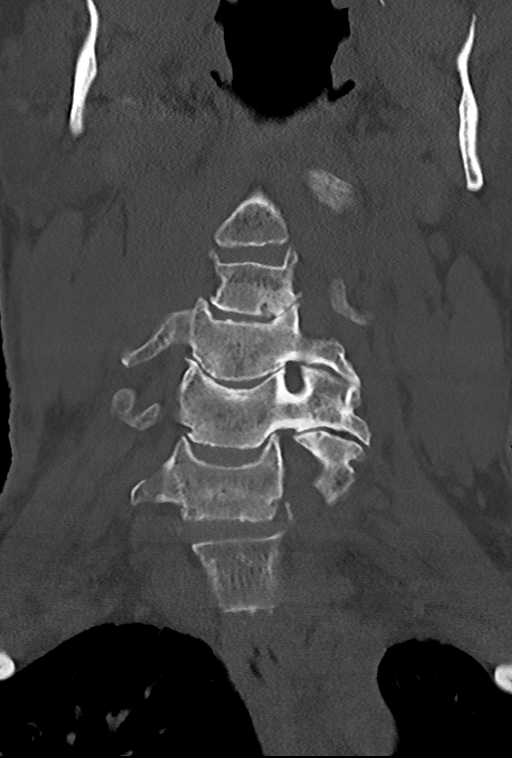
[im 37/61  bone]
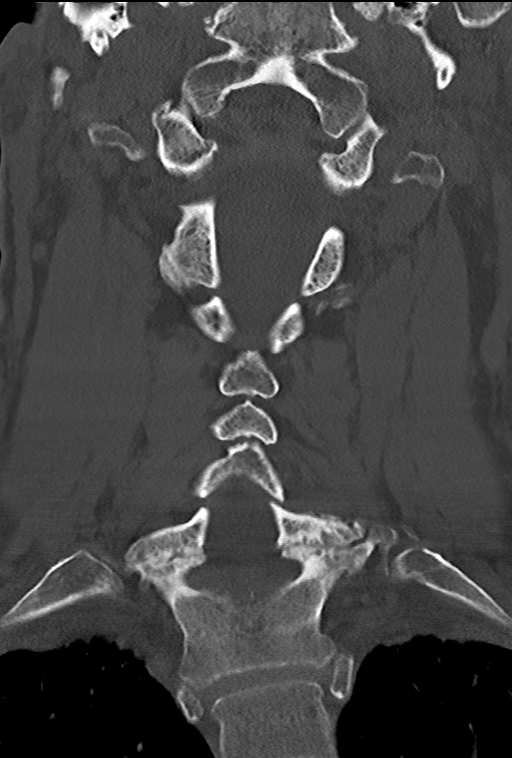

[13 of 33 positions shown; findings below may reference images not displayed]

FINDINGS: CT HEAD FINDINGS

Brain: No evidence of acute infarction, hemorrhage, hydrocephalus,
extra-axial collection or mass lesion/mass effect.

Vascular: No hyperdense vessel or unexpected calcification.

Skull: Normal. Negative for fracture or focal lesion.

Sinuses/Orbits: No acute finding.

Other: None.

CT CERVICAL SPINE FINDINGS

Alignment: Mild grade 1 retrolisthesis of C3-4 is noted secondary to
severe degenerative disc disease at this level.

Skull base and vertebrae: No acute fracture. No primary bone lesion
or focal pathologic process.

Soft tissues and spinal canal: No prevertebral fluid or swelling. No
visible canal hematoma.

Disc levels: Severe degenerative disc disease is noted at C3-4. Mild
degenerative disc disease is noted at C6-7.

Upper chest: Negative.

Other: Degenerative changes are seen involving multiple left-sided
posterior facet joints.
IMPRESSION: 1. Normal head CT.
2. Multilevel degenerative disc disease. No acute abnormality seen
in the cervical spine.

## 2020-05-04 IMAGING — CT CT HEAD W/O CM
4 series · 16 of 47 positions shown, 18 images · non-contrast
Comparison: [DATE].

CLINICAL DATA: Fall yesterday.

EXAM:
CT HEAD WITHOUT CONTRAST
CT CERVICAL SPINE WITHOUT CONTRAST
TECHNIQUE: Multidetector CT imaging of the head and cervical spine was
performed following the standard protocol without intravenous
contrast. Multiplanar CT image reconstructions of the cervical spine
were also generated.

[Series 3: head without · axial · non-contrast · 0.47mm/px · z∈[-144,-14]mm · 7 of 36 slices shown, 9 images]
[im 5/36  brain]
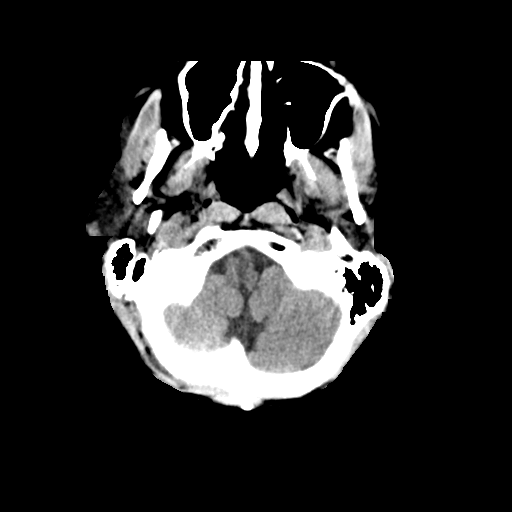
[im 5/36  bone]
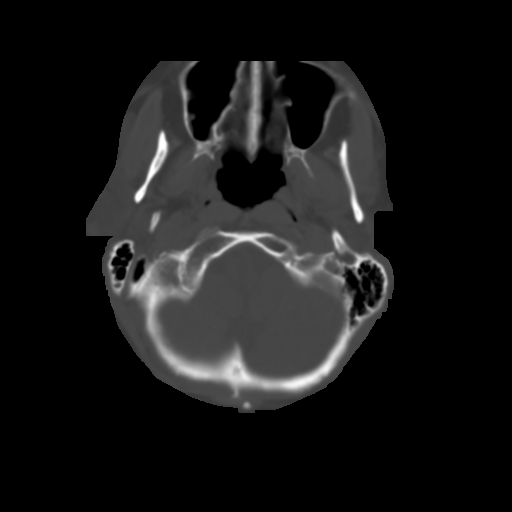
[im 9/36  brain]
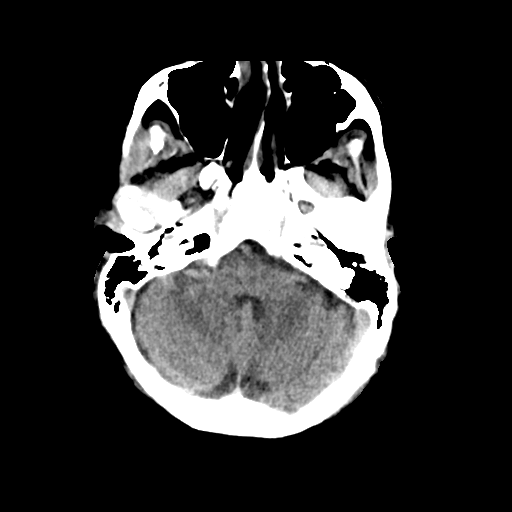
[im 14/36  brain]
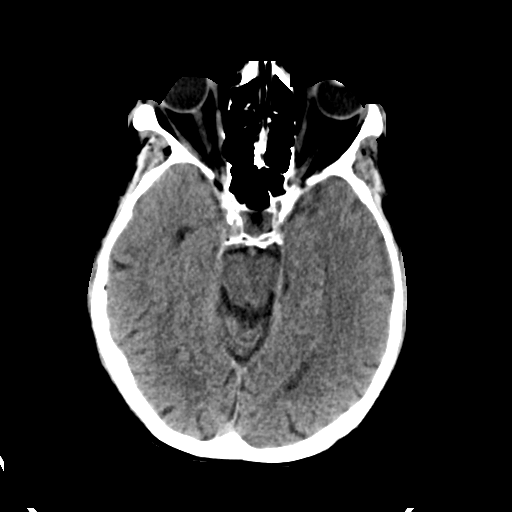
[im 18/36  brain]
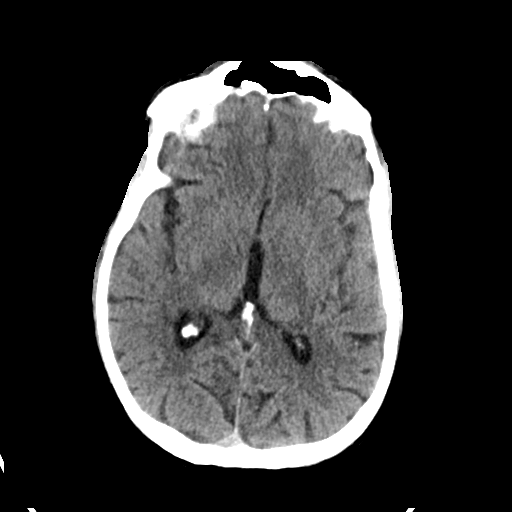
[im 22/36  brain]
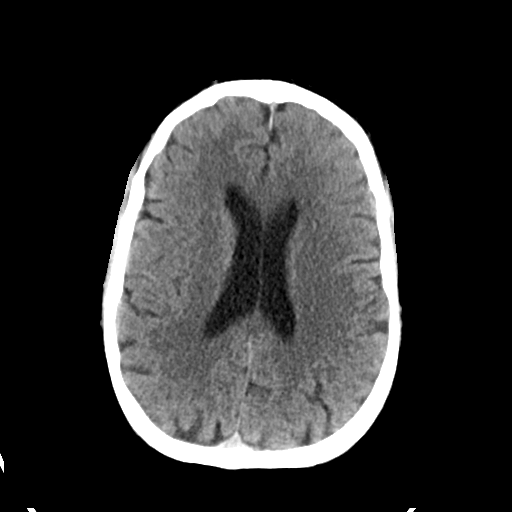
[im 22/36  bone]
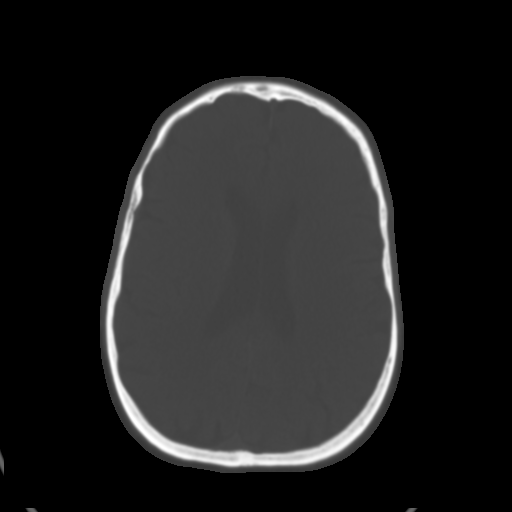
[im 27/36  brain]
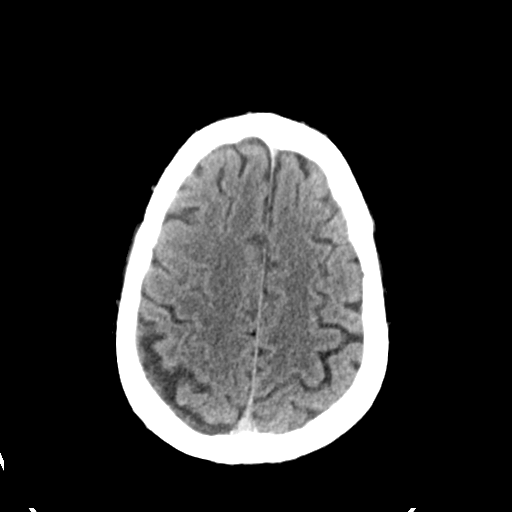
[im 31/36  brain]
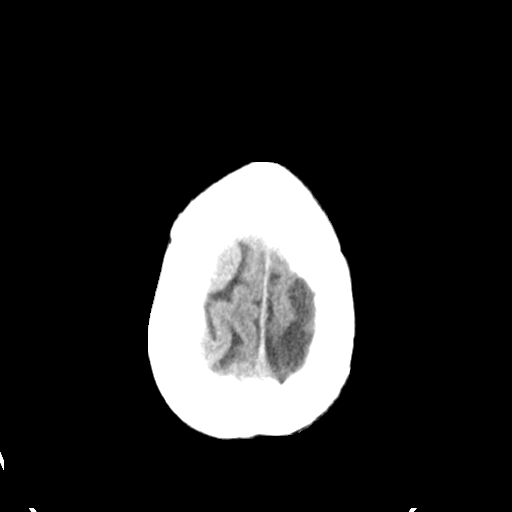

[Series 4: head bone · axial · 0.47mm/px · z∈[-148,-112]mm · 3 of 89 slices shown]
[im 9/89  bone]
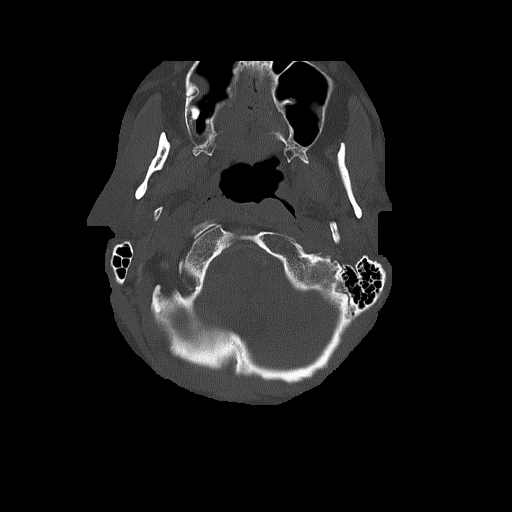
[im 18/89  bone]
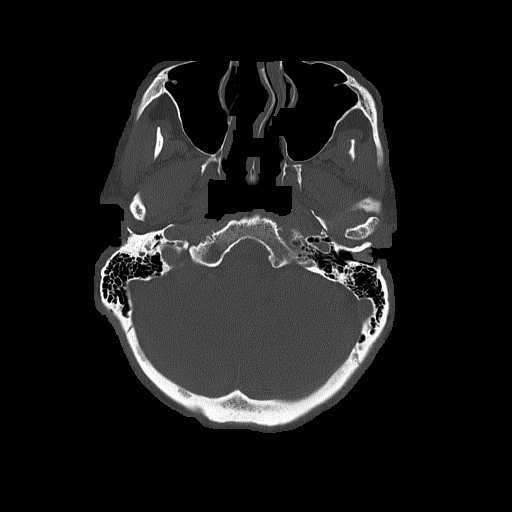
[im 27/89  bone]
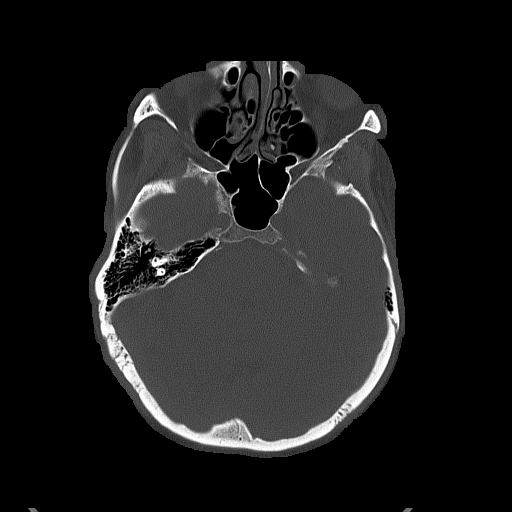

[Series 5: head without cor · coronal · non-contrast · 0.37mm/px · 3 of 76 slices shown]
[im 26/76  brain]
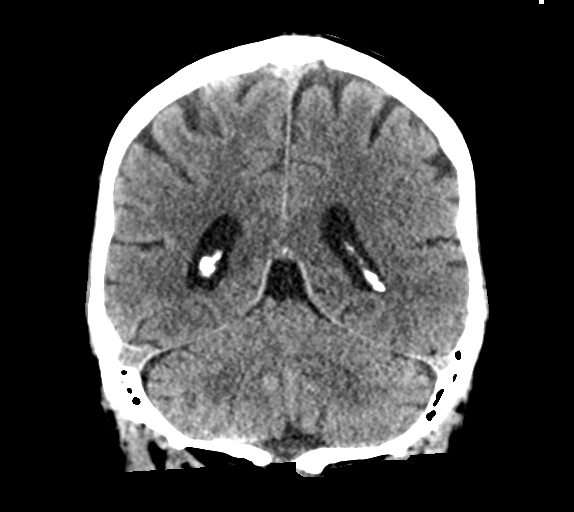
[im 34/76  brain]
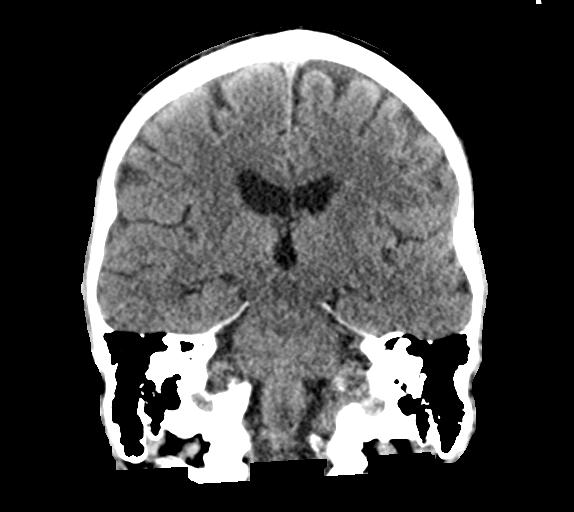
[im 42/76  brain]
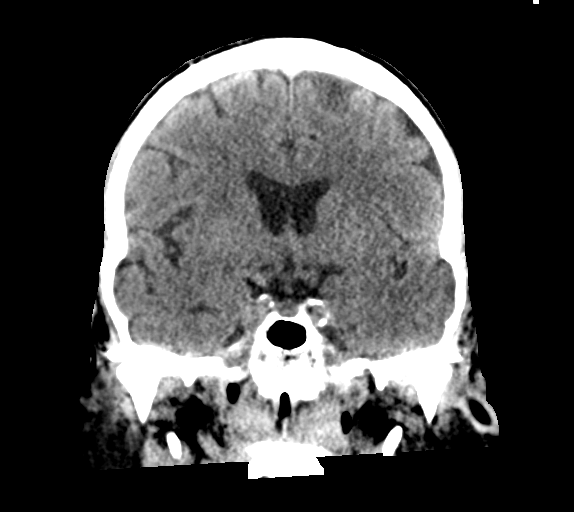

[Series 6: head without sag · sagittal · non-contrast · 0.35mm/px · 3 of 67 slices shown]
[im 23/67  brain]
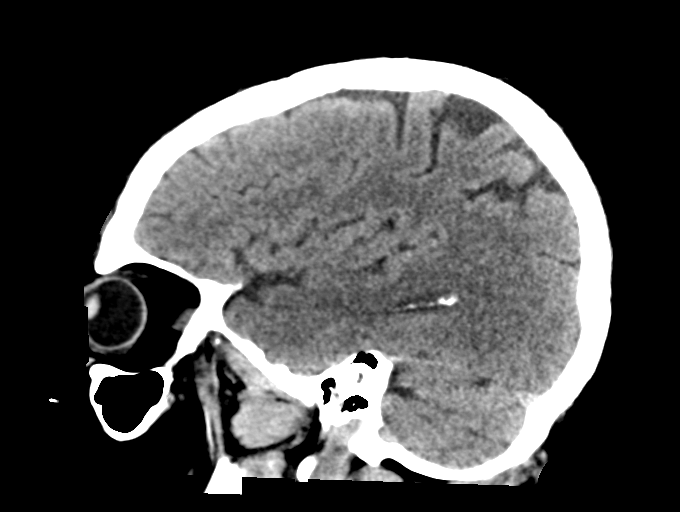
[im 34/67  brain]
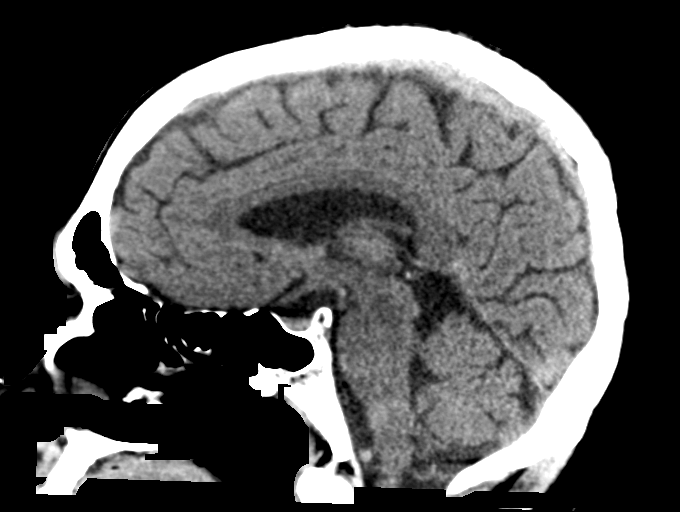
[im 45/67  brain]
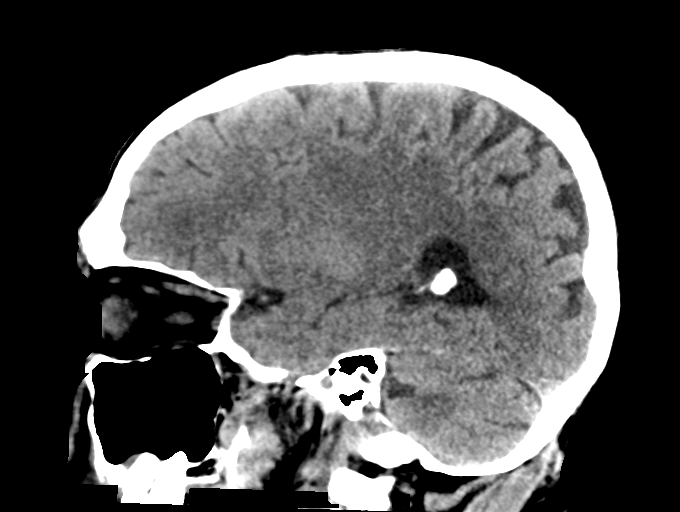

[16 of 47 positions shown; findings below may reference images not displayed]

FINDINGS: CT HEAD FINDINGS

Brain: No evidence of acute infarction, hemorrhage, hydrocephalus,
extra-axial collection or mass lesion/mass effect.

Vascular: No hyperdense vessel or unexpected calcification.

Skull: Normal. Negative for fracture or focal lesion.

Sinuses/Orbits: No acute finding.

Other: None.

CT CERVICAL SPINE FINDINGS

Alignment: Mild grade 1 retrolisthesis of C3-4 is noted secondary to
severe degenerative disc disease at this level.

Skull base and vertebrae: No acute fracture. No primary bone lesion
or focal pathologic process.

Soft tissues and spinal canal: No prevertebral fluid or swelling. No
visible canal hematoma.

Disc levels: Severe degenerative disc disease is noted at C3-4. Mild
degenerative disc disease is noted at C6-7.

Upper chest: Negative.

Other: Degenerative changes are seen involving multiple left-sided
posterior facet joints.
IMPRESSION: 1. Normal head CT.
2. Multilevel degenerative disc disease. No acute abnormality seen
in the cervical spine.

## 2020-05-04 MED ORDER — OXYCODONE-ACETAMINOPHEN 5-325 MG PO TABS
1.0000 | ORAL_TABLET | ORAL | Status: DC | PRN
  Administered 2020-05-04 – 2020-05-07 (×15): 2 via ORAL
  Filled 2020-05-04 (×15): qty 2

## 2020-05-04 MED ORDER — DULOXETINE HCL 60 MG PO CPEP
60.0000 mg | ORAL_CAPSULE | Freq: Every day | ORAL | Status: DC
  Administered 2020-05-05 – 2020-05-07 (×3): 60 mg via ORAL
  Filled 2020-05-04 (×3): qty 1

## 2020-05-04 MED ORDER — CEFAZOLIN SODIUM-DEXTROSE 2-4 GM/100ML-% IV SOLN
2.0000 g | Freq: Three times a day (TID) | INTRAVENOUS | Status: DC
  Administered 2020-05-04 – 2020-05-07 (×9): 2 g via INTRAVENOUS
  Filled 2020-05-04 (×10): qty 100

## 2020-05-04 MED ORDER — OXYCODONE-ACETAMINOPHEN 10-325 MG PO TABS: 1.0000 | ORAL_TABLET | ORAL | 0 refills | Status: DC | PRN

## 2020-05-04 MED ORDER — CEFAZOLIN SODIUM-DEXTROSE 2-4 GM/100ML-% IV SOLN
2.0000 g | INTRAVENOUS | Status: DC
  Filled 2020-05-04 (×2): qty 100

## 2020-05-04 MED ORDER — DIAZEPAM 5 MG PO TABS
5.0000 mg | ORAL_TABLET | Freq: Every day | ORAL | Status: DC
  Administered 2020-05-04 – 2020-05-06 (×3): 5 mg via ORAL
  Filled 2020-05-04 (×3): qty 1

## 2020-05-04 MED ORDER — MELATONIN 5 MG PO TABS
10.0000 mg | ORAL_TABLET | Freq: Every day | ORAL | Status: DC
  Administered 2020-05-04 – 2020-05-06 (×3): 10 mg via ORAL
  Filled 2020-05-04 (×3): qty 2

## 2020-05-04 MED ORDER — NALOXONE HCL 4 MG/0.1ML NA LIQD: 1.0000 | NASAL | Status: DC | PRN

## 2020-05-04 MED ORDER — CEFAZOLIN IV (FOR PTA / DISCHARGE USE ONLY): 2.0000 g | Freq: Three times a day (TID) | INTRAVENOUS | Status: DC

## 2020-05-04 MED ORDER — AMIODARONE HCL 200 MG PO TABS
200.0000 mg | ORAL_TABLET | Freq: Every day | ORAL | Status: DC
Start: 2020-05-04 — End: 2020-05-07
  Administered 2020-05-04 – 2020-05-07 (×4): 200 mg via ORAL
  Filled 2020-05-04 (×4): qty 1

## 2020-05-04 MED ORDER — HYDROMORPHONE HCL 1 MG/ML IJ SOLN
0.5000 mg | INTRAMUSCULAR | Status: DC | PRN
  Administered 2020-05-04 – 2020-05-05 (×3): 0.5 mg via INTRAVENOUS
  Filled 2020-05-04 (×3): qty 0.5

## 2020-05-04 MED ORDER — OXYCODONE HCL 5 MG PO TABS: 5.0000 mg | ORAL_TABLET | ORAL | Status: DC | PRN

## 2020-05-04 MED ORDER — ATORVASTATIN CALCIUM 40 MG PO TABS
40.0000 mg | ORAL_TABLET | Freq: Every day | ORAL | Status: DC
  Administered 2020-05-04 – 2020-05-06 (×3): 40 mg via ORAL
  Filled 2020-05-04 (×2): qty 1

## 2020-05-04 MED ORDER — ENSURE PRE-SURGERY PO LIQD
296.0000 mL | Freq: Once | ORAL | Status: AC
  Administered 2020-05-04: 296 mL via ORAL
  Filled 2020-05-04: qty 296

## 2020-05-04 MED ORDER — MORPHINE SULFATE (PF) 4 MG/ML IV SOLN
4.0000 mg | INTRAVENOUS | Status: AC | PRN
Start: 2020-05-04 — End: 2020-05-04
  Administered 2020-05-04 (×2): 4 mg via INTRAVENOUS
  Filled 2020-05-04 (×2): qty 1

## 2020-05-04 MED ORDER — PREDNISONE 10 MG PO TABS
10.0000 mg | ORAL_TABLET | Freq: Every day | ORAL | Status: DC
  Administered 2020-05-05 – 2020-05-07 (×3): 10 mg via ORAL
  Filled 2020-05-04 (×3): qty 1

## 2020-05-04 MED ORDER — MORPHINE SULFATE (PF) 2 MG/ML IV SOLN
0.5000 mg | INTRAVENOUS | Status: DC | PRN
Start: 2020-05-04 — End: 2020-05-06
  Administered 2020-05-04 – 2020-05-06 (×3): 0.5 mg via INTRAVENOUS
  Filled 2020-05-04 (×3): qty 1

## 2020-05-04 MED ORDER — POVIDONE-IODINE 10 % EX SWAB: 2.0000 "application " | Freq: Once | CUTANEOUS | Status: DC

## 2020-05-04 MED ORDER — LISINOPRIL 10 MG PO TABS
10.0000 mg | ORAL_TABLET | Freq: Every day | ORAL | Status: DC
  Administered 2020-05-04 – 2020-05-07 (×4): 10 mg via ORAL
  Filled 2020-05-04 (×4): qty 1

## 2020-05-04 MED ORDER — OXYCODONE-ACETAMINOPHEN 10-325 MG PO TABS
1.0000 | ORAL_TABLET | ORAL | Status: DC | PRN
Start: 2020-05-04 — End: 2020-05-04

## 2020-05-04 MED ORDER — SENNA 8.6 MG PO TABS
1.0000 | ORAL_TABLET | Freq: Two times a day (BID) | ORAL | Status: DC
  Administered 2020-05-04 – 2020-05-06 (×3): 8.6 mg via ORAL
  Filled 2020-05-04 (×6): qty 1

## 2020-05-04 MED ORDER — CHLORHEXIDINE GLUCONATE 4 % EX LIQD
60.0000 mL | Freq: Once | CUTANEOUS | Status: AC
  Administered 2020-05-05: 4 via TOPICAL
  Filled 2020-05-04: qty 15

## 2020-05-04 MED ORDER — ONDANSETRON HCL 4 MG/2ML IJ SOLN
4.0000 mg | Freq: Once | INTRAMUSCULAR | Status: AC
  Administered 2020-05-04: 4 mg via INTRAVENOUS
  Filled 2020-05-04: qty 2

## 2020-05-04 MED ORDER — GABAPENTIN 300 MG PO CAPS
300.0000 mg | ORAL_CAPSULE | Freq: Three times a day (TID) | ORAL | Status: DC
  Administered 2020-05-04: 300 mg via ORAL
  Filled 2020-05-04 (×7): qty 1

## 2020-05-04 NOTE — H&P (View-Only) (Signed)
Reason for Consult:Right hip fx Referring Physician: Hillard Danker Time called: 1132 Time at bedside: [name] is an 70 y.o. male.  HPI: Thomas [name] was carrying some boxes yesterday when he tripped on a rock and fell backwards onto his right side. He had immediate pain but with help from family was able to get up and get to bed. He toughed it out overnight in the hopes it would get better but the opposite happened and he came to the ED for evaluation. X-rays showed a hip fx and orthopedic surgery was consulted. 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  Past Medical History:  Diagnosis Date  . Atrial flutter ([place]) 01/2020  . Cancer [facility])    prostate  . Claustrophobia    quite severe  . Heart failure with reduced ejection fraction ([place])   . Hypertension   . Hypoglycemia    occ  . Left knee DJD    Xray 12/23/08  . NICM (nonischemic cardiomyopathy) ([place]) 02/12/2020  . Prostate cancer [facility]     Past Surgical History:  Procedure Laterality Date  . AMPUTATION TOE Right 12/2019  . AMPUTATION TOE Right 03/26/2020   Procedure: Right 3rd toe partial amputation, bone biopsy right 1st metatarsal;  Surgeon: Evelina Bucy, DPM;  Location: [facility];  Service: Podiatry;  Laterality: Right;  . BUBBLE STUDY  02/11/2020   Procedure: BUBBLE STUDY;  Surgeon: Geralynn Rile, MD;  Location: [place];  Service: Cardiovascular;;  . CARDIOVERSION N/A 02/11/2020   Procedure: CARDIOVERSION;  Surgeon: Geralynn Rile, MD;  Location: [place];  Service: Cardiovascular;  Laterality: N/A;  . CYSTOSCOPY  04/11/2018   Procedure: Erlene Quan;  Surgeon: [name], MD;  Location: [facility];  Service: Urology;;  NO SEEDS FOUND IN BLADDER  . HERNIA REPAIR  2009   inguinal  . IRRIGATION AND DEBRIDEMENT FOOT Left 03/26/2020   Procedure: Left foot incision and drainage with removal of all non-viable soft tissue and bone - areas overlying the 2nd and 5th  metatarsals.;  Surgeon: Evelina Bucy, DPM;  Location: [facility];  Service: Podiatry;  Laterality: Left;  . JOINT REPLACEMENT Bilateral 2017   knees  . RADIOACTIVE SEED IMPLANT N/A 04/11/2018   Procedure: RADIOACTIVE SEED IMPLANT/BRACHYTHERAPY IMPLANT;  Surgeon: [name], MD;  Location: [facility];  Service: Urology;  Laterality: N/A;   71     SEEDS IMPLANTED  . RIGHT/LEFT HEART CATH AND CORONARY ANGIOGRAPHY N/A 02/09/2020   Procedure: RIGHT/LEFT HEART CATH AND CORONARY ANGIOGRAPHY;  Surgeon: Nelva Bush, MD;  Location: [place] [facility];  Service: Cardiovascular;  Laterality: N/A;  . SPACE OAR INSTILLATION N/A 04/11/2018   Procedure: SPACE OAR INSTILLATION;  Surgeon: [name], MD;  Location: [facility];  Service: Urology;  Laterality: N/A;  . TEE WITHOUT CARDIOVERSION N/A 02/11/2020   Procedure: TRANSESOPHAGEAL ECHOCARDIOGRAM (TEE);  Surgeon: Geralynn Rile, MD;  Location: [place];  Service: Cardiovascular;  Laterality: N/A;  . TOTAL KNEE ARTHROPLASTY Bilateral 09/13/2015   Procedure: BILATERAL KNEE ARTHROPLASTY ;  Surgeon: Paralee Cancel, MD;  Location: [facility];  Service: Orthopedics;  Laterality: Bilateral;    Family History  Problem Relation Age of Onset  . Prostate cancer Father   . Prostate cancer Brother   . Colon cancer Neg Hx   . Rectal cancer Neg Hx   . Stomach cancer Neg Hx     Social History:  reports that he has  never smoked. He has never used smokeless tobacco. He reports previous drug use. He reports that he does not drink alcohol.  Allergies:  Allergies  Allergen Reactions  . Chlorthalidone Other (See Comments)    "Makes me light-headed and I don't like the way it makes me feel"  . Voltaren [Diclofenac] Rash    Medications: I have reviewed the patient's current medications.  Results for orders placed or performed during the hospital encounter of 05/04/20 (from the past 48 hour(s))  Basic metabolic  panel     Status: Abnormal   Collection Time: 05/04/20 11:20 AM  Result Value Ref Range   Sodium 137 135 - 145 mmol/L   Potassium 3.5 3.5 - 5.1 mmol/L   Chloride 100 98 - 111 mmol/L   CO2 26 22 - 32 mmol/L   Glucose, Bld 110 (H) 70 - 99 mg/dL    Comment: Glucose reference range applies only to samples taken after fasting for at least 8 hours.   BUN 16 8 - 23 mg/dL   Creatinine, Ser 0.65 0.61 - 1.24 mg/dL   Calcium 9.1 8.9 - 10.3 mg/dL   GFR, Estimated >60 >60 mL/min    Comment: (NOTE) Calculated using the CKD-EPI Creatinine Equation (2021)    Anion gap 11 5 - 15    Comment: Performed at [facility] [address] [place], [place] [address]  CBC WITH DIFFERENTIAL     Status: Abnormal   Collection Time: 05/04/20 11:20 AM  Result Value Ref Range   WBC 8.1 4.0 - 10.5 K/uL   RBC 4.47 4.22 - 5.81 MIL/uL   Hemoglobin 12.2 (L) 13.0 - 17.0 g/dL   HCT 38.3 (L) 39.0 - 52.0 %   MCV 85.7 80.0 - 100.0 fL   MCH 27.3 26.0 - 34.0 pg   MCHC 31.9 30.0 - 36.0 g/dL   RDW 19.6 (H) 11.5 - 15.5 %   Platelets 193 150 - 400 K/uL   nRBC 0.0 0.0 - 0.2 %   Neutrophils Relative % 80 %   Neutro Abs 6.4 1.7 - 7.7 K/uL   Lymphocytes Relative 8 %   Lymphs Abs 0.6 (L) 0.7 - 4.0 K/uL   Monocytes Relative 11 %   Monocytes Absolute 0.9 0.1 - 1.0 K/uL   Eosinophils Relative 1 %   Eosinophils Absolute 0.1 0.0 - 0.5 K/uL   Basophils Relative 0 %   Basophils Absolute 0.0 0.0 - 0.1 K/uL   Immature Granulocytes 0 %   Abs Immature Granulocytes 0.03 0.00 - 0.07 K/uL    Comment: Performed at [facility], [place] [address], [place], [place] [address]  Protime-INR     Status: None   Collection Time: 05/04/20 11:20 AM  Result Value Ref Range   Prothrombin Time 14.1 11.4 - 15.2 seconds   INR 1.1 0.8 - 1.2    Comment: (NOTE) INR goal varies based on device and disease states. Performed at [facility], [place] [address], [place], [place] [address]   Resp Panel by RT-PCR (Flu A&B, Covid)  Nasopharyngeal Swab     Status: None   Collection Time: 05/04/20 11:20 AM   Specimen: Nasopharyngeal Swab; Nasopharyngeal(NP) swabs in vial transport medium  Result Value Ref Range   SARS Coronavirus 2 by RT PCR NEGATIVE NEGATIVE    Comment: (NOTE) SARS-CoV-2 target nucleic acids are NOT DETECTED.  The SARS-CoV-2 RNA is generally detectable in upper respiratory specimens during the acute phase of infection. The lowest concentration of  SARS-CoV-2 viral copies this assay can detect is 138 copies/mL. A negative result does not preclude SARS-Cov-2 infection and should not be used as the sole basis for treatment or other patient management decisions. A negative result may occur with  improper specimen collection/handling, submission of specimen other than nasopharyngeal swab, presence of viral mutation(s) within the areas targeted by this assay, and inadequate number of viral copies(<138 copies/mL). A negative result must be combined with clinical observations, patient history, and epidemiological information. The expected result is Negative.  Fact Sheet for Patients:  EntrepreneurPulse.com.au  Fact Sheet for Healthcare Providers:  IncredibleEmployment.be  This test is no t yet approved or cleared by the [place] FDA and  has been authorized for detection and/or diagnosis of SARS-CoV-2 by FDA under an Emergency Use Authorization (EUA). This EUA will remain  in effect (meaning this test can be used) for the duration of the COVID-19 declaration under Section 564(b)(1) of the Act, 21 U.S.C.section 360bbb-3(b)(1), unless the authorization is terminated  or revoked sooner.       Influenza A by PCR NEGATIVE NEGATIVE   Influenza B by PCR NEGATIVE NEGATIVE    Comment: (NOTE) The Xpert Xpress SARS-CoV-2/FLU/RSV plus assay is intended as an aid in the diagnosis of influenza from Nasopharyngeal swab specimens and should not be used as a sole basis for  treatment. Nasal washings and aspirates are unacceptable for Xpert Xpress SARS-CoV-2/FLU/RSV testing.  Fact Sheet for Patients: EntrepreneurPulse.com.au  Fact Sheet for Healthcare Providers: IncredibleEmployment.be  This test is not yet approved or cleared by the [place] FDA and has been authorized for detection and/or diagnosis of SARS-CoV-2 by FDA under an Emergency Use Authorization (EUA). This EUA will remain in effect (meaning this test can be used) for the duration of the COVID-19 declaration under Section 564(b)(1) of the Act, 21 U.S.C. section 360bbb-3(b)(1), unless the authorization is terminated or revoked.  Performed at [facility], [place] [address] [place], [place] [address]   Type and screen [place]     Status: None (Preliminary result)   Collection Time: 05/04/20 11:25 AM  Result Value Ref Range   ABO/RH(D) PENDING    Antibody Screen PENDING    Sample Expiration      05/07/2020,2359 Performed at [facility], [place] [address], [place], [place] [address]     CT HEAD WO CONTRAST  Result Date: 05/04/2020 CLINICAL DATA:  Fall yesterday. EXAM: CT HEAD WITHOUT CONTRAST CT CERVICAL SPINE WITHOUT CONTRAST TECHNIQUE: Multidetector CT imaging of the head and cervical spine was performed following the standard protocol without intravenous contrast. Multiplanar CT image reconstructions of the cervical spine were also generated. COMPARISON:  November 18, 2019. FINDINGS: CT HEAD FINDINGS Brain: No evidence of acute infarction, hemorrhage, hydrocephalus, extra-axial collection or mass lesion/mass effect. Vascular: No hyperdense vessel or unexpected calcification. Skull: Normal. Negative for fracture or focal lesion. Sinuses/Orbits: No acute finding. Other: None. CT CERVICAL SPINE FINDINGS Alignment: Mild grade 1 retrolisthesis of C3-4 is noted secondary to severe degenerative disc disease at this level. Skull base  and vertebrae: No acute fracture. No primary bone lesion or focal pathologic process. Soft tissues and spinal canal: No prevertebral fluid or swelling. No visible canal hematoma. Disc levels: Severe degenerative disc disease is noted at C3-4. Mild degenerative disc disease is noted at C6-7. Upper chest: Negative. Other: Degenerative changes are seen involving multiple left-sided posterior facet joints. IMPRESSION: 1. Normal head CT. 2. Multilevel degenerative disc disease. No acute abnormality seen  in the cervical spine. Electronically Signed   By: Marijo Conception M.D.   On: 05/04/2020 12:46   CT CERVICAL SPINE WO CONTRAST  Result Date: 05/04/2020 CLINICAL DATA:  Fall yesterday. EXAM: CT HEAD WITHOUT CONTRAST CT CERVICAL SPINE WITHOUT CONTRAST TECHNIQUE: Multidetector CT imaging of the head and cervical spine was performed following the standard protocol without intravenous contrast. Multiplanar CT image reconstructions of the cervical spine were also generated. COMPARISON:  November 18, 2019. FINDINGS: CT HEAD FINDINGS Brain: No evidence of acute infarction, hemorrhage, hydrocephalus, extra-axial collection or mass lesion/mass effect. Vascular: No hyperdense vessel or unexpected calcification. Skull: Normal. Negative for fracture or focal lesion. Sinuses/Orbits: No acute finding. Other: None. CT CERVICAL SPINE FINDINGS Alignment: Mild grade 1 retrolisthesis of C3-4 is noted secondary to severe degenerative disc disease at this level. Skull base and vertebrae: No acute fracture. No primary bone lesion or focal pathologic process. Soft tissues and spinal canal: No prevertebral fluid or swelling. No visible canal hematoma. Disc levels: Severe degenerative disc disease is noted at C3-4. Mild degenerative disc disease is noted at C6-7. Upper chest: Negative. Other: Degenerative changes are seen involving multiple left-sided posterior facet joints. IMPRESSION: 1. Normal head CT. 2. Multilevel degenerative disc disease.  No acute abnormality seen in the cervical spine. Electronically Signed   By: Marijo Conception M.D.   On: 05/04/2020 12:46   DG Hip Unilat With Pelvis 2-3 Views Right  Result Date: 05/04/2020 CLINICAL DATA:  Severe right hip pain status post fall. EXAM: DG HIP (WITH OR WITHOUT PELVIS) 2-3V RIGHT COMPARISON:  01/20/2019 FINDINGS: Mildly displaced right intertrochanteric hip fracture. Mild spurring and joint space loss in the right hip. Radiation seeds noted within the prostate is is. He is IMPRESSION: Mildly displaced right trochanteric hip fracture. Electronically Signed   By: Miachel Roux M.D.   On: 05/04/2020 11:01    Review of Systems  HENT: Negative for ear discharge, ear pain, hearing loss and tinnitus.   Eyes: Negative for photophobia and pain.  Respiratory: Negative for cough and shortness of breath.   Cardiovascular: Negative for chest pain.  Gastrointestinal: Negative for abdominal pain, nausea and vomiting.  Genitourinary: Negative for dysuria, flank pain, frequency and urgency.  Musculoskeletal: Positive for arthralgias (Right hip). Negative for back pain, myalgias and neck pain.  Neurological: Negative for dizziness and headaches.  Hematological: Does not bruise/bleed easily.  Psychiatric/Behavioral: The patient is not nervous/anxious.    Blood pressure (!) 170/107, pulse 69, temperature 97.8 F (36.6 C), temperature source Oral, resp. rate 17, height 6\' 1"  (1.854 m), weight 94.8 kg, SpO2 97 %. Physical Exam Constitutional:      General: He is not in acute distress.    Appearance: He is well-developed and well-nourished. He is not diaphoretic.  HENT:     Head: Normocephalic and atraumatic.  Eyes:     General: No scleral icterus.       Right eye: No discharge.        Left eye: No discharge.     Conjunctiva/sclera: Conjunctivae normal.  Cardiovascular:     Rate and Rhythm: Normal rate and regular rhythm.  Pulmonary:     [place]: Pulmonary effort is normal. No respiratory  distress.  Musculoskeletal:     Cervical back: Normal range of motion.     Comments: RLE No traumatic wounds, ecchymosis, or rash  Severe TTP hip  No knee or ankle effusion  Knee stable to varus/ valgus and anterior/posterior stress  Sens DPN, SPN, TN  intact  Motor EHL, ext, flex, evers 5/5  DP 2+, PT 0, No significant edema  Skin:    General: Skin is warm and dry.  Neurological:     Mental Status: He is alert.  Psychiatric:        Mood and Affect: Mood and affect normal.        Behavior: Behavior normal.     Assessment/Plan: Right hip fx -- Plan IMN tomorrow morning with Dr. Doreatha Martin. Please keep NPO after MN. Multiple medical problems including afib on Eliquis, HTN, CHF, RA, and OA -- per internal medicine. Please hold Eliquis until after surgery. Appreciate their help.    Lisette Abu, PA-C Orthopedic Surgery [phone] 05/04/2020, 12:50 PM

## 2020-05-04 NOTE — Telephone Encounter (Signed)
Pt.'s wife reports pt. Fell at home yesterday. Hurt "his whole right side." "He hurts so bad this morning we can't get him out of the bed." Asking if she can call EMS. Instructed that is the safest scenario. Verbalizes understanding.  Reason for Disposition . [1] Can't stand (bear weight) or walk AND [2] new-onset after fall  Answer Assessment - Initial Assessment Questions 1. MECHANISM: "How did the fall happen?"     Fell yesterday 2. DOMESTIC VIOLENCE AND ELDER ABUSE SCREENING: "Did you fall because someone pushed you or tried to hurt you?" If Yes, ask: "Are you safe now?"     No 3. ONSET: "When did the fall happen?" (e.g., minutes, hours, or days ago)     Yesterday 4. LOCATION: "What part of the body hit the ground?" (e.g., back, buttocks, head, hips, knees, hands, head, stomach)     Fell on side 5. INJURY: "Did you hurt (injure) yourself when you fell?" If Yes, ask: "What did you injure? Tell me more about this?" (e.g., body area; type of injury; pain severity)"     Yes 6. PAIN: "Is there any pain?" If Yes, ask: "How bad is the pain?" (e.g., Scale 1-10; or mild,  moderate, severe)   - NONE (0): no pain   - MILD (1-3): doesn't interfere with normal activities    - MODERATE (4-7): interferes with normal activities or awakens from sleep    - SEVERE (8-10): excruciating pain, unable to do any normal activities      10 7. SIZE: For cuts, bruises, or swelling, ask: "How large is it?" (e.g., inches or centimeters)      n/a 8. PREGNANCY: "Is there any chance you are pregnant?" "When was your last menstrual period?"     n/a 9. OTHER SYMPTOMS: "Do you have any other symptoms?" (e.g., dizziness, fever, weakness; new onset or worsening).      No 10. CAUSE: "What do you think caused the fall (or falling)?" (e.g., tripped, dizzy spell)       No -  Protocols used: FALLS AND FALLING-A-AH

## 2020-05-04 NOTE — ED Notes (Signed)
RN attempted to call report 

## 2020-05-04 NOTE — ED Triage Notes (Signed)
Pt BIB [facility] from home c/o a fall at 1800 yesterday. Pt states he went out to get his medication deliveries and tried to carry everything in at once, pt wears a boot due to recent toe amputations and foot infection and tripped and fell. Pt helped himself into the house but could not move this morning and almost fell out of bed. Pt called EMS. Pt has obvious right leg rotation. Pt states right leg/hip pain is 9/10. Pt does have PICC in left arm r/t foot infection. Pt had 250 mcg of fentanyl enroute.

## 2020-05-04 NOTE — H&P (Signed)
[facility] Admission History and Physical Service Pager: [phone]  Patient name: Thomas [name] Medical record number: [id number] Date of birth: 1950/10/22 Age: 70 y.o. Gender: male  Primary Care Provider: [name], MD Consultants: ortho Code Status: full Preferred Emergency Contact: wife, Robin  Chief Complaint: Intertrochanteric Hip Fracture  Assessment and Plan: Thomas [name] is a 70 y.o. male presenting with a R intertrochanteric hip fracture. PMH is significant for HFrEF, AFib, RA, HTN, Depression/Anxiety, and multiple toe amputations 2/2 osteomyelitis.   R Hip Fracture, acute: Pt with acute R intertrochanteric hip fracture. Patient fell and hit hip, unsure of whether he also hit his head. DG Hip with mildly displaced R intertrochanteric hip fracture. CT Head and C Spine neg for acute injury.  VSS with the exception of some hypertension to the 160s-70s/100s that is likely 2/2 acute pain.  Patient has a history of chronic steroid and loop diuretic use with several past lab results demonstrating hypocalcemia. Ca++ wnl today, but likely that some osteopenia factored into his fracture presentation.  Seen by ortho in the ED who deemed him a good surgical candidate. Pt is on Eliquis at home for Afib. He thinks his last dose of Eliquis was on Saturday, but isn't sure. He is confident he has not had any today or yesterday.  Pain is currently controlled on PRN morphine in the ED. Patient has a history of chronic pain and opioid use for RA, and may therefore have a higher opioid requirement for adequate pain control.  - Admit to FPTS attending Dr. Ardelia Mems - Ortho planning for surgical fixation tomorrow am - SCDs  For VTE prevention - Holding Eliquis until after surgery - Oxycodone-acetominophen 10-340m 1-2 pills q4 hours for pain - IV dilaudid for breakthrough pain   HFrEF, chronic, stable: Last echo in 10/21 with EF 10-15%, small PFO with L to  R shunting, severely enlarged R ventrical, and mild mitral and tricuspid regurgitation. Home meds include torsemide 250mand 40 mEq KCl daily. 1+ pitting edema of bilateral ankles with chronic venous stasis changes to overlying skin. Lungs clear, no ascites.  - Hold Torsemide and KCl for now  MSSA Osteomyelitis of R great toe s/p amputation, stable: Has a PICC and has been receiving Ancef 2g daily. Last dose scheduled for 05/08/20. Surgical wounds well-healed on exam. Patient with decreased sensation of R foot. Pt. Does not have a history of diabetes. Pt. Remains afebrile and without a white count.  - Continue Ancef until 1/15   Afib, chronic: Takes amiodarone 20048mnd eliquis at home. Was on Diltiazem in the past, but reports that he had worsening LE and pulmonary edema with this.  HR in 60s-70s today. NSR on admission EKG.  - Hold Eliquis for surgery as above  - Continue amiodarone 200m53mily  Rheumatoid Arthritis: Patient has a several year history of RA. Has been on Enbrel and Methotrexate in the past, but has been off these for the past eight months due to concern for poor healing of foot wounds. Takes percocet, oxycodone, gabapentin, and prednisone 10mg101mly at home.  [facility] - Pain control as above  - Continue Gabapentin 300mg 52m- Continue prednisone 10 mg daily  HTN, chronic: checks every day. 129-135/80-85 BPs in ED have been 160s-70s/100s. Home meds include lisinopril 10mg d63m. He takes his BP daily at home and is usually around 130/90. Elevated readings today are likely 2/2 acute pain. - Continue home lisinopril 10mg.  33mession/Anxiety,  chronic: On Valium and Cymbalta at home.  - Continue home meds  FEN/GI: Regular diet, NPO after midnight Prophylaxis: Holding eliquis for surgery   Disposition: Med-Surg, will consult PT/OT s/p surgery to evaluate need for SNF vs. home  History of Present Illness:  Thomas [name] is a 70 y.o. male  presenting with an intertrochanteric hip fracture.  Patient has had issues with toe infections and had amputations of his right foot with subsequent difficulty walking. Patient was walking outside yesterday when he tripped and fell on his R hip. He was wearing post-op shoes and carrying packages while walking over uneven ground when the shoe caught on a rock and he fell backwards onto the lawn. Had instant 10/10 R hip pain. He made it into his home and went to bed to rest. This morning he was unable to get out of bed and called EMS. Per ED note, EMS noted rotation of the R foot. He thought he may have hit his head but was unsure. CT of head/c-spine was negative. No chest pain shortness of breath. No vomiting or diarrhea.  Vaccinated against COVID and has booster.    Review Of Systems: Per HPI with the following additions:   Review of Systems  Respiratory: Negative for shortness of breath.   Cardiovascular: Negative for chest pain.  Gastrointestinal: Negative for abdominal pain, diarrhea, nausea and vomiting.  Musculoskeletal: Positive for arthralgias and joint swelling.  Skin: Positive for color change.  Neurological: Negative for headaches.  All other systems reviewed and are negative.    Patient Active Problem List   Diagnosis Date Noted  . Hip fracture ([place]) 05/04/2020  . Acute osteomyelitis of toe, right ([place])   . Osteomyelitis of ankle or foot, acute, unspecified laterality ([place]) 03/25/2020  . Foot infection   . NICM (nonischemic cardiomyopathy) ([place]), no significant CAD on cardiac cath   02/12/2020  . Right foot pain   . Acute systolic heart failure ([place])   . Rheumatoid arthritis ([place])   . Atrial flutter ([place]) 02/02/2020  . Lower extremity edema 12/30/2019  . Weight loss, non-intentional 11/24/2019  . Chronic right hip pain 06/19/2019  . Degenerative tear of acetabular labrum of right hip 05/02/2019  . Primary osteoarthritis of right hip 05/02/2019  . Skin ulcer ([place])  01/29/2019  . Malignant neoplasm of prostate ([place]) 01/16/2018  . Insomnia 10/02/2017  . Cyclic citrullinated peptide (CCP) antibody positive 05/31/2016  . S/P bilateral TKA 09/13/2015  . Other bilateral secondary osteoarthritis of knee 07/14/2014  . Heme positive stool 05/21/2013  . Osteoarthritis, multiple sites 01/13/2011  . Hypertension 12/06/2010  . ED (erectile dysfunction) 08/25/2010  . CLAUSTROPHOBIA 03/01/2010    Past Medical History: Past Medical History:  Diagnosis Date  . Atrial flutter ([place]) 01/2020  . Cancer [facility])    prostate  . Claustrophobia    quite severe  . Heart failure with reduced ejection fraction ([place])   . Hypertension   . Hypoglycemia    occ  . Left knee DJD    Xray 12/23/08  . NICM (nonischemic cardiomyopathy) ([place]) 02/12/2020  . Prostate cancer [facility])     Past Surgical History: Past Surgical History:  Procedure Laterality Date  . AMPUTATION TOE Right 12/2019  . AMPUTATION TOE Right 03/26/2020   Procedure: Right 3rd toe partial amputation, bone biopsy right 1st metatarsal;  Surgeon: Evelina Bucy, DPM;  Location: [facility];  Service: Podiatry;  Laterality: Right;  . BUBBLE STUDY  02/11/2020   Procedure: BUBBLE STUDY;  Surgeon:  Geralynn Rile, MD;  Location: [place];  Service: Cardiovascular;;  . CARDIOVERSION N/A 02/11/2020   Procedure: CARDIOVERSION;  Surgeon: Geralynn Rile, MD;  Location: [place];  Service: Cardiovascular;  Laterality: N/A;  . CYSTOSCOPY  04/11/2018   Procedure: Erlene Quan;  Surgeon: [name], MD;  Location[facility];  Service: Urology;;  NO SEEDS FOUND IN BLADDER  . HERNIA REPAIR  2009   inguinal  . IRRIGATION AND DEBRIDEMENT FOOT Left 03/26/2020   Procedure: Left foot incision and drainage with removal of all non-viable soft tissue and bone - areas overlying the 2nd and 5th metatarsals.;  Surgeon: Evelina Bucy, DPM;  Location: [facility];  Service: Podiatry;   Laterality: Left;  . JOINT REPLACEMENT Bilateral 2017   knees  . RADIOACTIVE SEED IMPLANT N/A 04/11/2018   Procedure: RADIOACTIVE SEED IMPLANT/BRACHYTHERAPY IMPLANT;  Surgeon: [name], MD;  Location: [facility];  Service: Urology;  Laterality: N/A;   71     SEEDS IMPLANTED  . RIGHT/LEFT HEART CATH AND CORONARY ANGIOGRAPHY N/A 02/09/2020   Procedure: RIGHT/LEFT HEART CATH AND CORONARY ANGIOGRAPHY;  Surgeon: Nelva Bush, MD;  Location: [place] [facility];  Service: Cardiovascular;  Laterality: N/A;  . SPACE OAR INSTILLATION N/A 04/11/2018   Procedure: SPACE OAR INSTILLATION;  Surgeon: [name], MD;  Location: [facility];  Service: Urology;  Laterality: N/A;  . TEE WITHOUT CARDIOVERSION N/A 02/11/2020   Procedure: TRANSESOPHAGEAL ECHOCARDIOGRAM (TEE);  Surgeon: Geralynn Rile, MD;  Location: [place];  Service: Cardiovascular;  Laterality: N/A;  . TOTAL KNEE ARTHROPLASTY Bilateral 09/13/2015   Procedure: BILATERAL KNEE ARTHROPLASTY ;  Surgeon: Paralee Cancel, MD;  Location: [facility];  Service: Orthopedics;  Laterality: Bilateral;    Social History: Social History   Tobacco Use  . Smoking status: Never Smoker  . Smokeless tobacco: Never Used  Vaping Use  . Vaping Use: Never used  Substance Use Topics  . Alcohol use: No    Alcohol/week: 0.0 standard drinks  . Drug use: Not Currently   Additional social history:   Please also refer to relevant sections of EMR.  Family History: Family History  Problem Relation Age of Onset  . Prostate cancer Father   . Prostate cancer Brother   . Colon cancer Neg Hx   . Rectal cancer Neg Hx   . Stomach cancer Neg Hx     Allergies and Medications: Allergies  Allergen Reactions  . Diltiazem Hcl Swelling  . Chlorthalidone Other (See Comments)    "Makes me light-headed and I don't like the way it makes me feel"  . Voltaren [Diclofenac] Rash   No current facility-administered  medications on file prior to encounter.   Current Outpatient Medications on File Prior to Encounter  Medication Sig Dispense Refill  . amiodarone (PACERONE) 200 MG tablet Take 1 tablet (200 mg total) by mouth daily. 90 tablet 3  . apixaban (ELIQUIS) 5 MG TABS tablet Take 1 tablet (5 mg total) by mouth 2 (two) times daily. 60 tablet 6  . atorvastatin (LIPITOR) 40 MG tablet Take 1 tablet (40 mg total) by mouth daily. 90 tablet 3  . diazepam (VALIUM) 5 MG tablet Take 5 mg by mouth at bedtime.    . DULoxetine (CYMBALTA) 60 MG capsule Take 1 capsule (60 mg total) by mouth daily. 90 capsule 1  . lisinopril (ZESTRIL) 10 MG tablet Take 1 tablet (10 mg total) by mouth daily. 90 tablet 3  .  Melatonin 10 MG TABS Take 10 mg by mouth at bedtime.    Marland Kitchen oxyCODONE (ROXICODONE) 15 MG immediate release tablet Take 1 tablet (15 mg total) by mouth every 4 (four) hours as needed for pain. 20 tablet 0  . oxyCODONE-acetaminophen (PERCOCET) 10-325 MG tablet 1-2 tablets as needed every 4 hours for pain. This prescription must last 1 week. Do not take more than prescribed. (Patient taking differently: Take 1-2 tablets by mouth every 4 (four) hours as needed for pain.) 42 tablet 0  . potassium chloride SA (KLOR-CON M20) 20 MEQ tablet Take 2 tablets (40 mEq total) by mouth daily. 180 tablet 3  . predniSONE (DELTASONE) 10 MG tablet Take 10 mg by mouth daily with breakfast.     . torsemide (DEMADEX) 20 MG tablet Take 1 tablet (20 mg total) by mouth daily. 90 tablet 3  . ceFAZolin (ANCEF) IVPB Inject 2 g into the vein every 8 (eight) hours. Indication:  Osteomyelitis First Dose: No Last Day of Therapy:  05/08/2020 Labs - Once weekly:  CBC/D and BMP, Labs - Every other week:  ESR and CRP Method of administration: IV Push Method of administration may be changed at the discretion of home infusion pharmacist based upon assessment of the patient and/or caregiver's ability to self-administer the medication ordered. 120 Units 0  .  gabapentin (NEURONTIN) 300 MG capsule Take 1 capsule (300 mg total) by mouth 3 (three) times daily. To help with pain that is not relieved by pain medicatoin 90 capsule 3  . NARCAN 4 MG/0.1ML LIQD nasal spray kit Place 1 spray into the nose as needed (as directed for emergency). 2 each 0  . oxyCODONE-acetaminophen (PERCOCET) 10-325 MG tablet Take 1-2 tablets by mouth every 4 (four) hours as needed for pain (as needed). 15 tablet 0    Objective: BP (!) 165/87   Pulse 62   Temp 97.8 F (36.6 C) (Oral)   Resp 15   Ht _0  (1.854 m)   Wt 94.8 kg   SpO2 96%   BMI 27.57 kg/m  Exam: General: Patient lying in bed comfortably Eyes: Sclerae anicteric ENTM: Oral mucosae moist Cardiovascular: RRR, no m/r/g Respiratory: Lungs CTAB anteriorly Gastrointestinal: Bowel sounds nl, abdomen non-tender, non-distended  MSK: Shortened and externally rotated RLE. R hip TTP without overlying skin changes.  R foot s/p amputation of great toe and distal 3rd toe.  Swan neck deformities of bilateral hands  Derm: Venous stasis  changes to bilateral ankles Ext: Bilateral 1+ pitting edema to ankles Neuro: Decreased sensation in bilateral feet R>L Psych: Appropriate mood, affect, thought process  Labs and Imaging: CBC BMET  Recent Labs  Lab 05/04/20 1120  WBC 8.1  HGB 12.2*  HCT 38.3*  PLT 193   Recent Labs  Lab 05/04/20 1120  NA 137  K 3.5  CL 100  CO2 26  BUN 16  CREATININE 0.65  GLUCOSE 110*  CALCIUM 9.1     EKG: My own interpretation (not copied from electronic read)  NSR with signs of LVH  Pearla Dubonnet, Medical Student 05/04/2020, 5:12 PM

## 2020-05-04 NOTE — ED Notes (Signed)
Patient transported to X-ray 

## 2020-05-04 NOTE — Addendum Note (Signed)
Addended by: Boneta Lucks on: 05/04/2020 06:15 AM   Modules accepted: Orders

## 2020-05-04 NOTE — Consult Note (Signed)
Reason for Consult:Right hip fx Referring Physician: J Knapp Time called: 1132 Time at bedside: 1152   Thomas [name] is an 69 y.o. male.  HPI: Thomas [name] was carrying some boxes yesterday when he tripped on a rock and fell backwards onto his right side. He had immediate pain but with help from family was able to get up and get to bed. He toughed it out overnight in the hopes it would get better but the opposite happened and he came to the ED for evaluation. X-rays showed a hip fx and orthopedic surgery was consulted. 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  Past Medical History:  Diagnosis Date  . Atrial flutter (HCC) 01/2020  . Cancer (HCC)    prostate  . Claustrophobia    quite severe  . Heart failure with reduced ejection fraction (HCC)   . Hypertension   . Hypoglycemia    occ  . Left knee DJD    Xray 12/23/08  . NICM (nonischemic cardiomyopathy) (HCC) 02/12/2020  . Prostate cancer (HCC)     Past Surgical History:  Procedure Laterality Date  . AMPUTATION TOE Right 12/2019  . AMPUTATION TOE Right 03/26/2020   Procedure: Right 3rd toe partial amputation, bone biopsy right 1st metatarsal;  Surgeon: Price, Malachai Schalk J, DPM;  Location: [facility];  Service: Podiatry;  Laterality: Right;  . BUBBLE STUDY  02/11/2020   Procedure: BUBBLE STUDY;  Surgeon: O'Neal, Palmyra Thomas, MD;  Location: [facility];  Service: Cardiovascular;;  . CARDIOVERSION N/A 02/11/2020   Procedure: CARDIOVERSION;  Surgeon: O'Neal, Hornitos Thomas, MD;  Location: [facility];  Service: Cardiovascular;  Laterality: N/A;  . CYSTOSCOPY  04/11/2018   Procedure: CYSTOSCOPY FLEXIBLE;  Surgeon: Herrick, Benjamin W, MD;  Location: [facility];  Service: Urology;;  NO SEEDS FOUND IN BLADDER  . HERNIA REPAIR  2009   inguinal  . IRRIGATION AND DEBRIDEMENT FOOT Left 03/26/2020   Procedure: Left foot incision and drainage with removal of all non-viable soft tissue and bone - areas overlying the 2nd and 5th  metatarsals.;  Surgeon: Price, Suella Cogar J, DPM;  Location: [facility];  Service: Podiatry;  Laterality: Left;  . JOINT REPLACEMENT Bilateral 2017   knees  . RADIOACTIVE SEED IMPLANT N/A 04/11/2018   Procedure: RADIOACTIVE SEED IMPLANT/BRACHYTHERAPY IMPLANT;  Surgeon: Herrick, Benjamin W, MD;  Location: [facility];  Service: Urology;  Laterality: N/A;   71     SEEDS IMPLANTED  . RIGHT/LEFT HEART CATH AND CORONARY ANGIOGRAPHY N/A 02/09/2020   Procedure: RIGHT/LEFT HEART CATH AND CORONARY ANGIOGRAPHY;  Surgeon: End, Christopher, MD;  [facility];  Service: Cardiovascular;  Laterality: N/A;  . SPACE OAR INSTILLATION N/A 04/11/2018   Procedure: SPACE OAR INSTILLATION;  Surgeon: Herrick, Benjamin W, MD;  Location: [facility];  Service: Urology;  Laterality: N/A;  . TEE WITHOUT CARDIOVERSION N/A 02/11/2020   Procedure: TRANSESOPHAGEAL ECHOCARDIOGRAM (TEE);  Surgeon: O'Neal, Franklin Thomas, MD;  [facility];  Service: Cardiovascular;  Laterality: N/A;  . TOTAL KNEE ARTHROPLASTY Bilateral 09/13/2015   Procedure: BILATERAL KNEE ARTHROPLASTY ;  Surgeon: Matthew Olin, MD;  Location: [facility];  Service: Orthopedics;  Laterality: Bilateral;    Family History  Problem Relation Age of Onset  . Prostate cancer Father   . Prostate cancer Brother   . Colon cancer Neg Hx   . Rectal cancer Neg Hx   . Stomach cancer Neg Hx     Social History:  reports that he has   never smoked. He has never used smokeless tobacco. He reports previous drug use. He reports that he does not drink alcohol.  Allergies:  Allergies  Allergen Reactions  . Chlorthalidone Other (See Comments)    "Makes me light-headed and I don't like the way it makes me feel"  . Voltaren [Diclofenac] Rash    Medications: I have reviewed the patient's current medications.  Results for orders placed or performed during the hospital encounter of 05/04/20 (from the past 48 hour(s))  Basic metabolic  panel     Status: Abnormal   Collection Time: 05/04/20 11:20 AM  Result Value Ref Range   Sodium 137 135 - 145 mmol/L   Potassium 3.5 3.5 - 5.1 mmol/L   Chloride 100 98 - 111 mmol/L   CO2 26 22 - 32 mmol/L   Glucose, Bld 110 (H) 70 - 99 mg/dL    Comment: Glucose reference range applies only to samples taken after fasting for at least 8 hours.   BUN 16 8 - 23 mg/dL   Creatinine, Ser 0.65 0.61 - 1.24 mg/dL   Calcium 9.1 8.9 - 10.3 mg/dL   GFR, Estimated >60 >60 mL/min    Comment: (NOTE) Calculated using the CKD-EPI Creatinine Equation (2021)    Anion gap 11 5 - 15    Comment: Performed at [facility] [address] [place], [place] [address]  CBC WITH DIFFERENTIAL     Status: Abnormal   Collection Time: 05/04/20 11:20 AM  Result Value Ref Range   WBC 8.1 4.0 - 10.5 K/uL   RBC 4.47 4.22 - 5.81 MIL/uL   Hemoglobin 12.2 (L) 13.0 - 17.0 g/dL   HCT 38.3 (L) 39.0 - 52.0 %   MCV 85.7 80.0 - 100.0 fL   MCH 27.3 26.0 - 34.0 pg   MCHC 31.9 30.0 - 36.0 g/dL   RDW 19.6 (H) 11.5 - 15.5 %   Platelets 193 150 - 400 K/uL   nRBC 0.0 0.0 - 0.2 %   Neutrophils Relative % 80 %   Neutro Abs 6.4 1.7 - 7.7 K/uL   Lymphocytes Relative 8 %   Lymphs Abs 0.6 (L) 0.7 - 4.0 K/uL   Monocytes Relative 11 %   Monocytes Absolute 0.9 0.1 - 1.0 K/uL   Eosinophils Relative 1 %   Eosinophils Absolute 0.1 0.0 - 0.5 K/uL   Basophils Relative 0 %   Basophils Absolute 0.0 0.0 - 0.1 K/uL   Immature Granulocytes 0 %   Abs Immature Granulocytes 0.03 0.00 - 0.07 K/uL    Comment: Performed at [facility], [place] [address] [place], [place] [address]  Protime-INR     Status: None   Collection Time: 05/04/20 11:20 AM  Result Value Ref Range   Prothrombin Time 14.1 11.4 - 15.2 seconds   INR 1.1 0.8 - 1.2    Comment: (NOTE) INR goal varies based on device and disease states. Performed at [facility], [place] [address], [place], [place] [address]   Resp Panel by RT-PCR (Flu A&B, Covid)  Nasopharyngeal Swab     Status: None   Collection Time: 05/04/20 11:20 AM   Specimen: Nasopharyngeal Swab; Nasopharyngeal(NP) swabs in vial transport medium  Result Value Ref Range   SARS Coronavirus 2 by RT PCR NEGATIVE NEGATIVE    Comment: (NOTE) SARS-CoV-2 target nucleic acids are NOT DETECTED.  The SARS-CoV-2 RNA is generally detectable in upper respiratory specimens during the acute phase of infection. The lowest concentration of  SARS-CoV-2 viral copies this assay can detect is 138 copies/mL. A negative result does not preclude SARS-Cov-2 infection and should not be used as the sole basis for treatment or other patient management decisions. A negative result may occur with  improper specimen collection/handling, submission of specimen other than nasopharyngeal swab, presence of viral mutation(s) within the areas targeted by this assay, and inadequate number of viral copies(<138 copies/mL). A negative result must be combined with clinical observations, patient history, and epidemiological information. The expected result is Negative.  Fact Sheet for Patients:  EntrepreneurPulse.com.au  Fact Sheet for Healthcare Providers:  IncredibleEmployment.be  This test is no t yet approved or cleared by the [place] FDA and  has been authorized for detection and/or diagnosis of SARS-CoV-2 by FDA under an Emergency Use Authorization (EUA). This EUA will remain  in effect (meaning this test can be used) for the duration of the COVID-19 declaration under Section 564(b)(1) of the Act, 21 U.S.C.section 360bbb-3(b)(1), unless the authorization is terminated  or revoked sooner.       Influenza A by PCR NEGATIVE NEGATIVE   Influenza B by PCR NEGATIVE NEGATIVE    Comment: (NOTE) The Xpert Xpress SARS-CoV-2/FLU/RSV plus assay is intended as an aid in the diagnosis of influenza from Nasopharyngeal swab specimens and should not be used as a sole basis for  treatment. Nasal washings and aspirates are unacceptable for Xpert Xpress SARS-CoV-2/FLU/RSV testing.  Fact Sheet for Patients: EntrepreneurPulse.com.au  Fact Sheet for Healthcare Providers: IncredibleEmployment.be  This test is not yet approved or cleared by the [place] FDA and has been authorized for detection and/or diagnosis of SARS-CoV-2 by FDA under an Emergency Use Authorization (EUA). This EUA will remain in effect (meaning this test can be used) for the duration of the COVID-19 declaration under Section 564(b)(1) of the Act, 21 U.S.C. section 360bbb-3(b)(1), unless the authorization is terminated or revoked.  Performed at [facility], [place] [address] [place], [place] [address]   Type and screen [place]     Status: None (Preliminary result)   Collection Time: 05/04/20 11:25 AM  Result Value Ref Range   ABO/RH(D) PENDING    Antibody Screen PENDING    Sample Expiration      05/07/2020,2359 Performed at [facility], [place] [address], [place], [place] [address]     CT HEAD WO CONTRAST  Result Date: 05/04/2020 CLINICAL DATA:  Fall yesterday. EXAM: CT HEAD WITHOUT CONTRAST CT CERVICAL SPINE WITHOUT CONTRAST TECHNIQUE: Multidetector CT imaging of the head and cervical spine was performed following the standard protocol without intravenous contrast. Multiplanar CT image reconstructions of the cervical spine were also generated. COMPARISON:  November 18, 2019. FINDINGS: CT HEAD FINDINGS Brain: No evidence of acute infarction, hemorrhage, hydrocephalus, extra-axial collection or mass lesion/mass effect. Vascular: No hyperdense vessel or unexpected calcification. Skull: Normal. Negative for fracture or focal lesion. Sinuses/Orbits: No acute finding. Other: None. CT CERVICAL SPINE FINDINGS Alignment: Mild grade 1 retrolisthesis of C3-4 is noted secondary to severe degenerative disc disease at this level. Skull base  and vertebrae: No acute fracture. No primary bone lesion or focal pathologic process. Soft tissues and spinal canal: No prevertebral fluid or swelling. No visible canal hematoma. Disc levels: Severe degenerative disc disease is noted at C3-4. Mild degenerative disc disease is noted at C6-7. Upper chest: Negative. Other: Degenerative changes are seen involving multiple left-sided posterior facet joints. IMPRESSION: 1. Normal head CT. 2. Multilevel degenerative disc disease. No acute abnormality seen  in the cervical spine. Electronically Signed   By: Marijo Conception M.D.   On: 05/04/2020 12:46   CT CERVICAL SPINE WO CONTRAST  Result Date: 05/04/2020 CLINICAL DATA:  Fall yesterday. EXAM: CT HEAD WITHOUT CONTRAST CT CERVICAL SPINE WITHOUT CONTRAST TECHNIQUE: Multidetector CT imaging of the head and cervical spine was performed following the standard protocol without intravenous contrast. Multiplanar CT image reconstructions of the cervical spine were also generated. COMPARISON:  November 18, 2019. FINDINGS: CT HEAD FINDINGS Brain: No evidence of acute infarction, hemorrhage, hydrocephalus, extra-axial collection or mass lesion/mass effect. Vascular: No hyperdense vessel or unexpected calcification. Skull: Normal. Negative for fracture or focal lesion. Sinuses/Orbits: No acute finding. Other: None. CT CERVICAL SPINE FINDINGS Alignment: Mild grade 1 retrolisthesis of C3-4 is noted secondary to severe degenerative disc disease at this level. Skull base and vertebrae: No acute fracture. No primary bone lesion or focal pathologic process. Soft tissues and spinal canal: No prevertebral fluid or swelling. No visible canal hematoma. Disc levels: Severe degenerative disc disease is noted at C3-4. Mild degenerative disc disease is noted at C6-7. Upper chest: Negative. Other: Degenerative changes are seen involving multiple left-sided posterior facet joints. IMPRESSION: 1. Normal head CT. 2. Multilevel degenerative disc disease.  No acute abnormality seen in the cervical spine. Electronically Signed   By: Marijo Conception M.D.   On: 05/04/2020 12:46   DG Hip Unilat With Pelvis 2-3 Views Right  Result Date: 05/04/2020 CLINICAL DATA:  Severe right hip pain status post fall. EXAM: DG HIP (WITH OR WITHOUT PELVIS) 2-3V RIGHT COMPARISON:  01/20/2019 FINDINGS: Mildly displaced right intertrochanteric hip fracture. Mild spurring and joint space loss in the right hip. Radiation seeds noted within the prostate is is. He is IMPRESSION: Mildly displaced right trochanteric hip fracture. Electronically Signed   By: Miachel Roux M.D.   On: 05/04/2020 11:01    Review of Systems  HENT: Negative for ear discharge, ear pain, hearing loss and tinnitus.   Eyes: Negative for photophobia and pain.  Respiratory: Negative for cough and shortness of breath.   Cardiovascular: Negative for chest pain.  Gastrointestinal: Negative for abdominal pain, nausea and vomiting.  Genitourinary: Negative for dysuria, flank pain, frequency and urgency.  Musculoskeletal: Positive for arthralgias (Right hip). Negative for back pain, myalgias and neck pain.  Neurological: Negative for dizziness and headaches.  Hematological: Does not bruise/bleed easily.  Psychiatric/Behavioral: The patient is not nervous/anxious.    Blood pressure (!) 170/107, pulse 69, temperature 97.8 F (36.6 C), temperature source Oral, resp. rate 17, height 6\' 1"  (1.854 m), weight 94.8 kg, SpO2 97 %. Physical Exam Constitutional:      General: He is not in acute distress.    Appearance: He is well-developed and well-nourished. He is not diaphoretic.  HENT:     Head: Normocephalic and atraumatic.  Eyes:     General: No scleral icterus.       Right eye: No discharge.        Left eye: No discharge.     Conjunctiva/sclera: Conjunctivae normal.  Cardiovascular:     Rate and Rhythm: Normal rate and regular rhythm.  Pulmonary:     Effort: Pulmonary effort is normal. No respiratory  distress.  Musculoskeletal:     Cervical back: Normal range of motion.     Comments: RLE No traumatic wounds, ecchymosis, or rash  Severe TTP hip  No knee or ankle effusion  Knee stable to varus/ valgus and anterior/posterior stress  Sens DPN, SPN, TN  intact  Motor EHL, ext, flex, evers 5/5  DP 2+, PT 0, No significant edema  Skin:    General: Skin is warm and dry.  Neurological:     Mental Status: He is alert.  Psychiatric:        Mood and Affect: Mood and affect normal.        Behavior: Behavior normal.     Assessment/Plan: Right hip fx -- Plan IMN tomorrow morning with Dr. Doreatha Martin. Please keep NPO after MN. Multiple medical problems including afib on Eliquis, HTN, CHF, RA, and OA -- per internal medicine. Please hold Eliquis until after surgery. Appreciate their help.    Lisette Abu, PA-C Orthopedic Surgery [phone] 05/04/2020, 12:50 PM

## 2020-05-04 NOTE — Plan of Care (Signed)

## 2020-05-04 NOTE — ED Provider Notes (Signed)
[name] EMERGENCY DEPARTMENT Provider Note   CSN: [id number] Arrival date & time: 05/04/20  1000     History Chief Complaint  Patient presents with  . Fall    Thomas [name] is a 70 y.o. male.  HPI   Patient presents to the ED for evaluation after a fall. Patient has had issues with toe infections and had amputations of his right foot. He has had some gait issues. Patient states he was walking outside when he tripped and fell. Patient landed on the right side injuring his hip and knee. He was able to get himself back into his house. This morning however when he tried to get up he was having severe pain and could not walk. He had to call EMS. They noted that his foot appeared rotated. Patient states pain is 9 out of 10. He was given 250 mg of fentanyl. Patient thinks he may have hit his head. He is not sure if he lost consciousness. He was dazed. He denies any neck pain right now. No chest pain shortness of breath. No vomiting or diarrhea.  Past Medical History:  Diagnosis Date  . Atrial flutter ([place]) 01/2020  . Cancer [facility])    prostate  . Claustrophobia    quite severe  . Heart failure with reduced ejection fraction ([place])   . Hypertension   . Hypoglycemia    occ  . Left knee DJD    Xray 12/23/08  . NICM (nonischemic cardiomyopathy) ([place]) 02/12/2020  . Prostate cancer [facility])     Patient Active Problem List   Diagnosis Date Noted  . Acute osteomyelitis of toe, right ([place])   . Osteomyelitis of ankle or foot, acute, unspecified laterality ([place]) 03/25/2020  . Foot infection   . NICM (nonischemic cardiomyopathy) ([place]), no significant CAD on cardiac cath   02/12/2020  . Right foot pain   . Acute systolic heart failure ([place])   . Rheumatoid arthritis ([place])   . Atrial flutter ([place]) 02/02/2020  . Lower extremity edema 12/30/2019  . Weight loss, non-intentional 11/24/2019  . Chronic right hip pain 06/19/2019  . Degenerative tear of acetabular labrum of right  hip 05/02/2019  . Primary osteoarthritis of right hip 05/02/2019  . Skin ulcer ([place]) 01/29/2019  . Malignant neoplasm of prostate ([place]) 01/16/2018  . Insomnia 10/02/2017  . Cyclic citrullinated peptide (CCP) antibody positive 05/31/2016  . S/P bilateral TKA 09/13/2015  . Other bilateral secondary osteoarthritis of knee 07/14/2014  . Heme positive stool 05/21/2013  . Osteoarthritis, multiple sites 01/13/2011  . Hypertension 12/06/2010  . ED (erectile dysfunction) 08/25/2010  . CLAUSTROPHOBIA 03/01/2010    Past Surgical History:  Procedure Laterality Date  . AMPUTATION TOE Right 12/2019  . AMPUTATION TOE Right 03/26/2020   Procedure: Right 3rd toe partial amputation, bone biopsy right 1st metatarsal;  Surgeon: Evelina Bucy, DPM;  Location: [facility];  Service: Podiatry;  Laterality: Right;  . BUBBLE STUDY  02/11/2020   Procedure: BUBBLE STUDY;  Surgeon: Geralynn Rile, MD;  Location: [place];  Service: Cardiovascular;;  . CARDIOVERSION N/A 02/11/2020   Procedure: CARDIOVERSION;  Surgeon: Geralynn Rile, MD;  Location: [place];  Service: Cardiovascular;  Laterality: N/A;  . CYSTOSCOPY  04/11/2018   Procedure: Erlene Quan;  Surgeon: [name], MD;  Location: [facility];  Service: Urology;;  NO SEEDS FOUND IN BLADDER  . HERNIA REPAIR  2009   inguinal  . IRRIGATION AND DEBRIDEMENT FOOT Left 03/26/2020  Procedure: Left foot incision and drainage with removal of all non-viable soft tissue and bone - areas overlying the 2nd and 5th metatarsals.;  Surgeon: Evelina Bucy, DPM;  Location: [facility];  Service: Podiatry;  Laterality: Left;  . JOINT REPLACEMENT Bilateral 2017   knees  . RADIOACTIVE SEED IMPLANT N/A 04/11/2018   Procedure: RADIOACTIVE SEED IMPLANT/BRACHYTHERAPY IMPLANT;  Surgeon: [name], MD;  Location: [facility];  Service: Urology;  Laterality: N/A;   71     SEEDS IMPLANTED  . RIGHT/LEFT HEART  CATH AND CORONARY ANGIOGRAPHY N/A 02/09/2020   Procedure: RIGHT/LEFT HEART CATH AND CORONARY ANGIOGRAPHY;  Surgeon: Nelva Bush, MD;  Location: [facility];  Service: Cardiovascular;  Laterality: N/A;  . SPACE OAR INSTILLATION N/A 04/11/2018   Procedure: SPACE OAR INSTILLATION;  Surgeon: [name], MD;  Location: [facility];  Service: Urology;  Laterality: N/A;  . TEE WITHOUT CARDIOVERSION N/A 02/11/2020   Procedure: TRANSESOPHAGEAL ECHOCARDIOGRAM (TEE);  Surgeon: Geralynn Rile, MD;  Location: [place];  Service: Cardiovascular;  Laterality: N/A;  . TOTAL KNEE ARTHROPLASTY Bilateral 09/13/2015   Procedure: BILATERAL KNEE ARTHROPLASTY ;  Surgeon: Paralee Cancel, MD;  Location: [facility];  Service: Orthopedics;  Laterality: Bilateral;       Family History  Problem Relation Age of Onset  . Prostate cancer Father   . Prostate cancer Brother   . Colon cancer Neg Hx   . Rectal cancer Neg Hx   . Stomach cancer Neg Hx     Social History   Tobacco Use  . Smoking status: Never Smoker  . Smokeless tobacco: Never Used  Vaping Use  . Vaping Use: Never used  Substance Use Topics  . Alcohol use: No    Alcohol/week: 0.0 standard drinks  . Drug use: Not Currently    Home Medications Prior to Admission medications   Medication Sig Start Date End Date Taking? Authorizing Provider  amiodarone (PACERONE) 200 MG tablet Take 1 tablet (200 mg total) by mouth daily. 03/31/20  Yes Burtis Junes, NP  apixaban (ELIQUIS) 5 MG TABS tablet Take 1 tablet (5 mg total) by mouth 2 (two) times daily. 03/31/20  Yes Burtis Junes, NP  atorvastatin (LIPITOR) 40 MG tablet Take 1 tablet (40 mg total) by mouth daily. 03/31/20  Yes Burtis Junes, NP  diazepam (VALIUM) 5 MG tablet Take 5 mg by mouth at bedtime. 04/15/20  Yes [provider]  DULoxetine (CYMBALTA) 60 MG capsule Take 1 capsule (60 mg total) by mouth daily. 03/24/20  Yes Chambliss, Jeb Levering, MD   lisinopril (ZESTRIL) 10 MG tablet Take 1 tablet (10 mg total) by mouth daily. 03/31/20 06/29/20 Yes Burtis Junes, NP  Melatonin 10 MG TABS Take 10 mg by mouth at bedtime.   Yes [provider]  oxyCODONE (ROXICODONE) 15 MG immediate release tablet Take 1 tablet (15 mg total) by mouth every 4 (four) hours as needed for pain. 03/30/20  Yes Evelina Bucy, DPM  oxyCODONE-acetaminophen (PERCOCET) 10-325 MG tablet 1-2 tablets as needed every 4 hours for pain. This prescription must last 1 week. Do not take more than prescribed. Patient taking differently: Take 1-2 tablets by mouth every 4 (four) hours as needed for pain. 04/28/20 05/05/20 Yes Stover, Titorya, DPM  potassium chloride SA (KLOR-CON M20) 20 MEQ tablet Take 2 tablets (40 mEq total) by mouth daily. 03/31/20  Yes Burtis Junes, NP  predniSONE (DELTASONE) 10 MG tablet Take 10 mg  by mouth daily with breakfast.    Yes [provider]  torsemide (DEMADEX) 20 MG tablet Take 1 tablet (20 mg total) by mouth daily. 03/31/20  Yes Burtis Junes, NP  ceFAZolin (ANCEF) IVPB Inject 2 g into the vein every 8 (eight) hours. Indication:  Osteomyelitis First Dose: No Last Day of Therapy:  05/08/2020 Labs - Once weekly:  CBC/D and BMP, Labs - Every other week:  ESR and CRP Method of administration: IV Push Method of administration may be changed at the discretion of home infusion pharmacist based upon assessment of the patient and/or caregiver's ability to self-administer the medication ordered. 03/29/20 05/08/20  Antonieta Pert, MD  gabapentin (NEURONTIN) 300 MG capsule Take 1 capsule (300 mg total) by mouth 3 (three) times daily. To help with pain that is not relieved by pain medicatoin 04/26/20   Landis Martins, DPM  NARCAN 4 MG/0.1ML LIQD nasal spray kit Place 1 spray into the nose as needed (as directed for emergency). 02/11/20   Darreld Mclean, PA-C  oxyCODONE-acetaminophen (PERCOCET) 10-325 MG tablet Take 1-2 tablets by mouth every 4  (four) hours as needed for pain (as needed). 05/04/20   Felipa Furnace, DPM    Allergies    Chlorthalidone and Voltaren [diclofenac]  Review of Systems   Review of Systems  All other systems reviewed and are negative.   Physical Exam Updated Vital Signs BP (!) 169/83   Pulse 64   Temp 97.8 F (36.6 C) (Oral)   Resp 16   Ht 1.854 m ($Remove'6\' 1"'jOmSuYC$ )   Wt 94.8 kg   SpO2 99%   BMI 27.57 kg/m   Physical Exam Vitals and nursing note reviewed.  Constitutional:      Appearance: He is well-developed and well-nourished. He is not diaphoretic.  HENT:     Head: Normocephalic and atraumatic.     Right Ear: External ear normal.     Left Ear: External ear normal.  Eyes:     General: No scleral icterus.       Right eye: No discharge.        Left eye: No discharge.     Conjunctiva/sclera: Conjunctivae normal.  Neck:     Trachea: No tracheal deviation.  Cardiovascular:     Rate and Rhythm: Normal rate and regular rhythm.     Pulses: Intact distal pulses.  Pulmonary:     Effort: Pulmonary effort is normal. No respiratory distress.     Breath sounds: Normal breath sounds. No stridor. No wheezing or rales.  Abdominal:     General: Bowel sounds are normal. There is no distension.     Palpations: Abdomen is soft.     Tenderness: There is no abdominal tenderness. There is no guarding or rebound.  Musculoskeletal:        General: Tenderness present.     Cervical back: Neck supple.     Right lower leg: Edema present.     Left lower leg: Edema present.     Comments: Mild edema bilateral lower extremities, right hip ttp , shortneded rle, unable to lift right leg, able to wiggle toes, sensation intact  Skin:    General: Skin is warm and dry.     Findings: No rash.  Neurological:     Mental Status: He is alert.     Cranial Nerves: No cranial nerve deficit (no facial droop, extraocular movements intact, no slurred speech).     Sensory: No sensory deficit.     Motor: No  abnormal muscle tone or  seizure activity.     Coordination: Coordination normal.     Deep Tendon Reflexes: Strength normal.  Psychiatric:        Mood and Affect: Mood and affect normal.     ED Results / Procedures / Treatments   Labs (all labs ordered are listed, but only abnormal results are displayed) Labs Reviewed  BASIC METABOLIC PANEL - Abnormal; Notable for the following components:      Result Value   Glucose, Bld 110 (*)    All other components within normal limits  CBC WITH DIFFERENTIAL/PLATELET - Abnormal; Notable for the following components:   Hemoglobin 12.2 (*)    HCT 38.3 (*)    RDW 19.6 (*)    Lymphs Abs 0.6 (*)    All other components within normal limits  RESP PANEL BY RT-PCR (FLU A&B, COVID) [id number]  PROTIME-INR  TYPE AND SCREEN    EKG EKG Interpretation  Date/Time:  Tuesday May 04 2020 11:25:10 EST Ventricular Rate:  65 PR Interval:    QRS Duration: 97 QT Interval:  437 QTC Calculation: 455 R Axis:   24 Text Interpretation: Sinus or ectopic atrial rhythm Borderline prolonged PR interval Probable LVH with secondary repol abnrm No significant change since last tracing Confirmed by Dorie Rank [phone]) on 05/04/2020 11:26:54 AM   Radiology CT HEAD WO CONTRAST  Result Date: 05/04/2020 CLINICAL DATA:  Fall yesterday. EXAM: CT HEAD WITHOUT CONTRAST CT CERVICAL SPINE WITHOUT CONTRAST TECHNIQUE: Multidetector CT imaging of the head and cervical spine was performed following the standard protocol without intravenous contrast. Multiplanar CT image reconstructions of the cervical spine were also generated. COMPARISON:  November 18, 2019. FINDINGS: CT HEAD FINDINGS Brain: No evidence of acute infarction, hemorrhage, hydrocephalus, extra-axial collection or mass lesion/mass effect. Vascular: No hyperdense vessel or unexpected calcification. Skull: Normal. Negative for fracture or focal lesion. Sinuses/Orbits: No acute finding. Other: None. CT CERVICAL SPINE FINDINGS Alignment: Mild grade 1  retrolisthesis of C3-4 is noted secondary to severe degenerative disc disease at this level. Skull base and vertebrae: No acute fracture. No primary bone lesion or focal pathologic process. Soft tissues and spinal canal: No prevertebral fluid or swelling. No visible canal hematoma. Disc levels: Severe degenerative disc disease is noted at C3-4. Mild degenerative disc disease is noted at C6-7. Upper chest: Negative. Other: Degenerative changes are seen involving multiple left-sided posterior facet joints. IMPRESSION: 1. Normal head CT. 2. Multilevel degenerative disc disease. No acute abnormality seen in the cervical spine. Electronically Signed   By: Marijo Conception M.D.   On: 05/04/2020 12:46   CT CERVICAL SPINE WO CONTRAST  Result Date: 05/04/2020 CLINICAL DATA:  Fall yesterday. EXAM: CT HEAD WITHOUT CONTRAST CT CERVICAL SPINE WITHOUT CONTRAST TECHNIQUE: Multidetector CT imaging of the head and cervical spine was performed following the standard protocol without intravenous contrast. Multiplanar CT image reconstructions of the cervical spine were also generated. COMPARISON:  November 18, 2019. FINDINGS: CT HEAD FINDINGS Brain: No evidence of acute infarction, hemorrhage, hydrocephalus, extra-axial collection or mass lesion/mass effect. Vascular: No hyperdense vessel or unexpected calcification. Skull: Normal. Negative for fracture or focal lesion. Sinuses/Orbits: No acute finding. Other: None. CT CERVICAL SPINE FINDINGS Alignment: Mild grade 1 retrolisthesis of C3-4 is noted secondary to severe degenerative disc disease at this level. Skull base and vertebrae: No acute fracture. No primary bone lesion or focal pathologic process. Soft tissues and spinal canal: No prevertebral fluid or swelling. No visible canal hematoma. Disc levels: Severe degenerative  disc disease is noted at C3-4. Mild degenerative disc disease is noted at C6-7. Upper chest: Negative. Other: Degenerative changes are seen involving multiple  left-sided posterior facet joints. IMPRESSION: 1. Normal head CT. 2. Multilevel degenerative disc disease. No acute abnormality seen in the cervical spine. Electronically Signed   By: Marijo Conception M.D.   On: 05/04/2020 12:46   DG Hip Unilat With Pelvis 2-3 Views Right  Result Date: 05/04/2020 CLINICAL DATA:  Severe right hip pain status post fall. EXAM: DG HIP (WITH OR WITHOUT PELVIS) 2-3V RIGHT COMPARISON:  01/20/2019 FINDINGS: Mildly displaced right intertrochanteric hip fracture. Mild spurring and joint space loss in the right hip. Radiation seeds noted within the prostate is is. He is IMPRESSION: Mildly displaced right trochanteric hip fracture. Electronically Signed   By: Miachel Roux M.D.   On: 05/04/2020 11:01    Procedures Procedures (including critical care time)  Medications Ordered in ED Medications  morphine 4 MG/ML injection 4 mg (4 mg Intravenous Given 05/04/20 1133)  ondansetron (ZOFRAN) injection 4 mg (4 mg Intravenous Given 05/04/20 1133)    ED Course  I have reviewed the triage vital signs and the nursing notes.  Pertinent labs & imaging results that were available during my care of the patient were reviewed by me and considered in my medical decision making (see chart for details).  Clinical Course as of 05/04/20 1426  Tue May 04, 2020  1127 X-ray shows a intertrochanteric hip fracture [JK]  1424 Labs reviewed. CBC and metabolic panel are normal. [JK]  1424  Covid test are negative. Head and C-spine CT are negative. [JK]    Clinical Course User Index [JK] Dorie Rank, MD   MDM Rules/Calculators/A&P                          Patient presented to the ED for evaluation of pain after fall. Patient's x-rays show an intertrochanteric right hip fracture. No signs of other injuries on head CT or C-spine CT. Patient's laboratory tests are otherwise unremarkable. Orthopedic surgery has been consulted. Patient will require operative repair. Patient has remained hemodynamically  stable. I will consult with the medical service for admission and further treatment Final Clinical Impression(s) / ED Diagnoses Final diagnoses:  Closed fracture of right hip, initial encounter [facility])      Dorie Rank, MD 05/04/20 1426

## 2020-05-04 NOTE — Telephone Encounter (Signed)
done

## 2020-05-05 ENCOUNTER — Encounter: Payer: Medicare Other | Admitting: Podiatry

## 2020-05-05 ENCOUNTER — Encounter (HOSPITAL_COMMUNITY)
Admission: EM | Disposition: A | Discharge status: Home Health | Payer: Self-pay | Source: Home / Self Care | Attending: Family Medicine

## 2020-05-05 ENCOUNTER — Inpatient Hospital Stay (HOSPITAL_COMMUNITY): Payer: Medicare Other | Admitting: Anesthesiology

## 2020-05-05 ENCOUNTER — Inpatient Hospital Stay (HOSPITAL_COMMUNITY): Payer: Medicare Other

## 2020-05-05 ENCOUNTER — Encounter (HOSPITAL_COMMUNITY): Payer: Self-pay | Admitting: Student in an Organized Health Care Education/Training Program

## 2020-05-05 DIAGNOSIS — S72002A Fracture of unspecified part of neck of left femur, initial encounter for closed fracture: Secondary | ICD-10-CM

## 2020-05-05 DIAGNOSIS — M86179 Other acute osteomyelitis, unspecified ankle and foot: Secondary | ICD-10-CM

## 2020-05-05 DIAGNOSIS — I4891 Unspecified atrial fibrillation: Secondary | ICD-10-CM

## 2020-05-05 DIAGNOSIS — I4892 Unspecified atrial flutter: Secondary | ICD-10-CM

## 2020-05-05 DIAGNOSIS — M810 Age-related osteoporosis without current pathological fracture: Secondary | ICD-10-CM | POA: Diagnosis present

## 2020-05-05 DIAGNOSIS — Z01810 Encounter for preprocedural cardiovascular examination: Secondary | ICD-10-CM

## 2020-05-05 DIAGNOSIS — I420 Dilated cardiomyopathy: Secondary | ICD-10-CM

## 2020-05-05 DIAGNOSIS — M818 Other osteoporosis without current pathological fracture: Secondary | ICD-10-CM

## 2020-05-05 HISTORY — PX: INTRAMEDULLARY (IM) NAIL INTERTROCHANTERIC: SHX5875

## 2020-05-05 LAB — VITAMIN D 25 HYDROXY (VIT D DEFICIENCY, FRACTURES): Vit D, 25-Hydroxy: 28.61 ng/mL — ABNORMAL LOW (ref 30–100)

## 2020-05-05 LAB — CBC
HCT: 36.5 % — ABNORMAL LOW (ref 39.0–52.0)
Hemoglobin: 12 g/dL — ABNORMAL LOW (ref 13.0–17.0)
MCH: 27.6 pg (ref 26.0–34.0)
MCHC: 32.9 g/dL (ref 30.0–36.0)
MCV: 84.1 fL (ref 80.0–100.0)
Platelets: 184 10*3/uL (ref 150–400)
RBC: 4.34 MIL/uL (ref 4.22–5.81)
RDW: 19.9 % — ABNORMAL HIGH (ref 11.5–15.5)
WBC: 6.3 10*3/uL (ref 4.0–10.5)
nRBC: 0 % (ref 0.0–0.2)

## 2020-05-05 LAB — BASIC METABOLIC PANEL
Anion gap: 9 (ref 5–15)
BUN: 11 mg/dL (ref 8–23)
CO2: 29 mmol/L (ref 22–32)
Calcium: 8.8 mg/dL — ABNORMAL LOW (ref 8.9–10.3)
Chloride: 99 mmol/L (ref 98–111)
Creatinine, Ser: 0.62 mg/dL (ref 0.61–1.24)
GFR, Estimated: >60 mL/min (ref >60)
Glucose, Bld: 92 mg/dL (ref 70–99)
Potassium: 3.5 mmol/L (ref 3.5–5.1)
Sodium: 137 mmol/L (ref 135–145)

## 2020-05-05 IMAGING — DX DG HIP (WITH OR WITHOUT PELVIS) 1V PORT*R*
3 series · 3 of 3 positions shown · non-contrast
Comparison: [DATE]

CLINICAL DATA: Status post fracture.

EXAM:
DG HIP (WITH OR WITHOUT PELVIS) 1V PORT RIGHT

[pelvis ap]
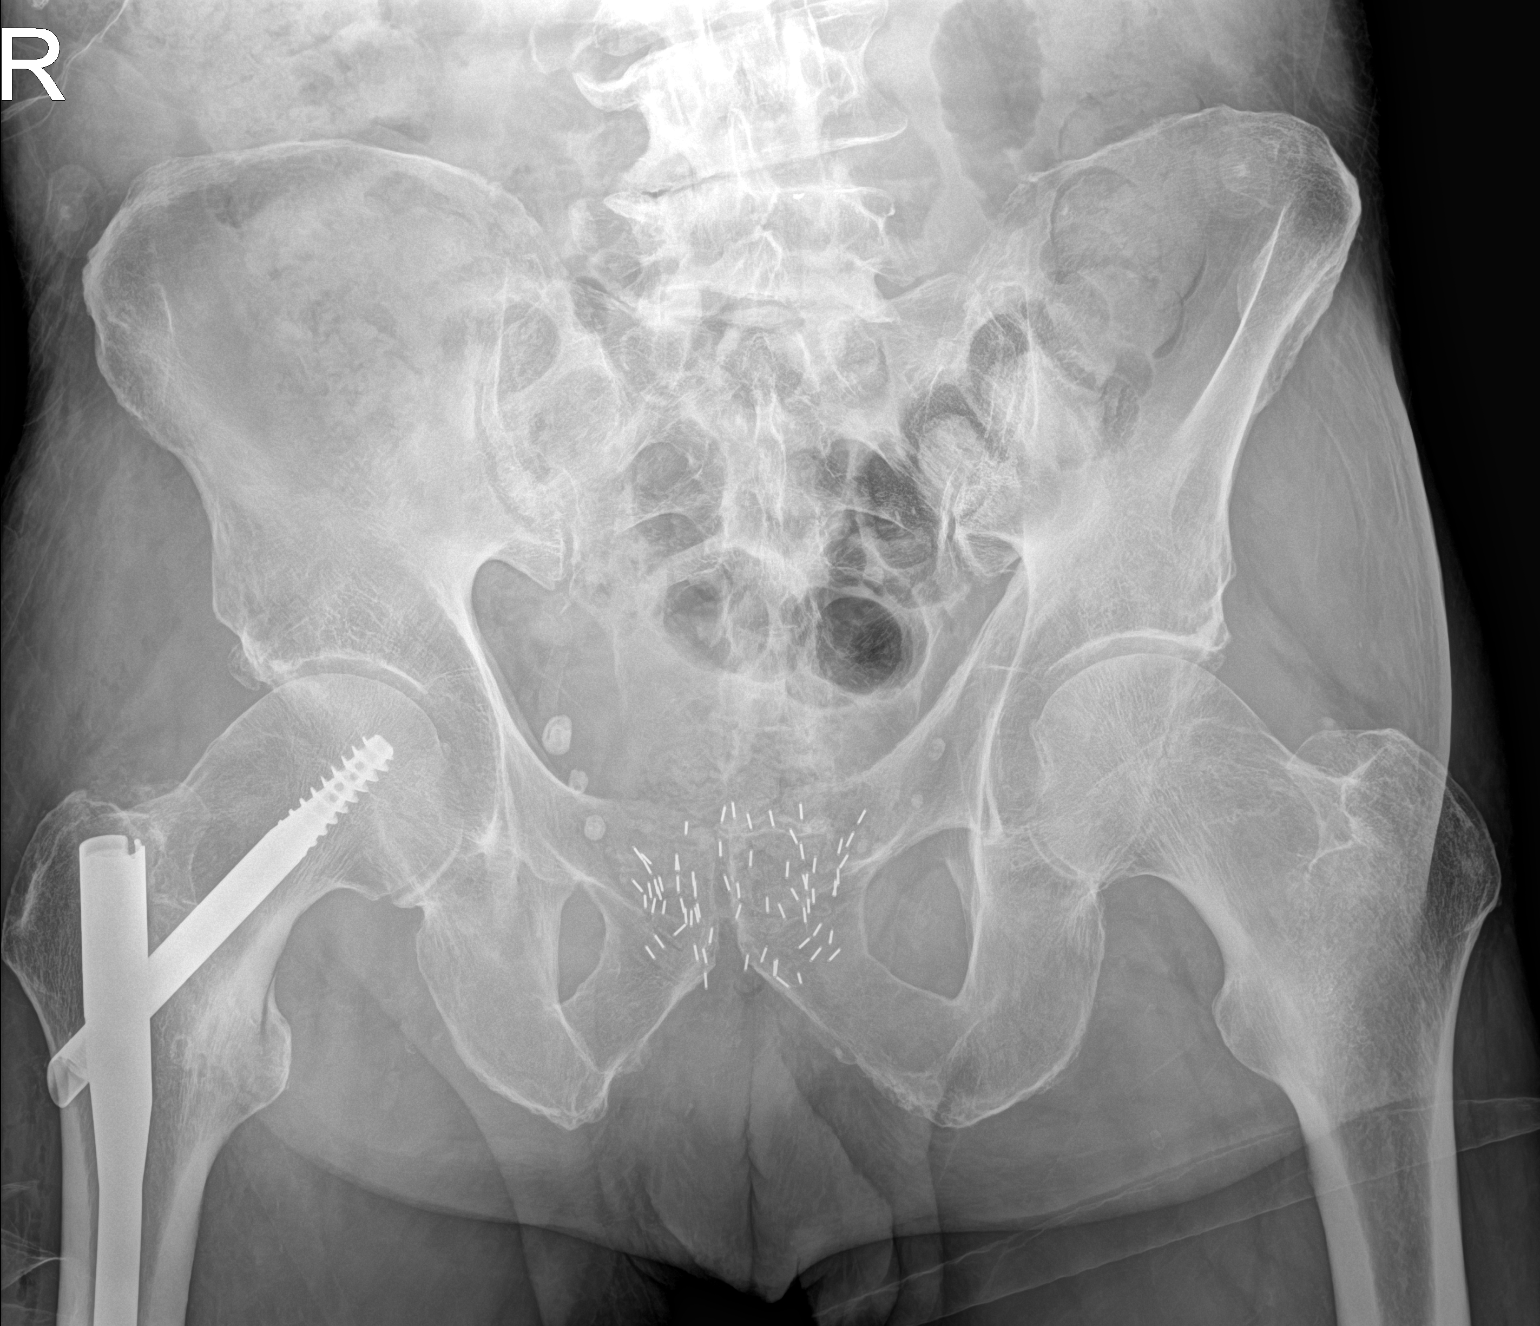

[hip ap]
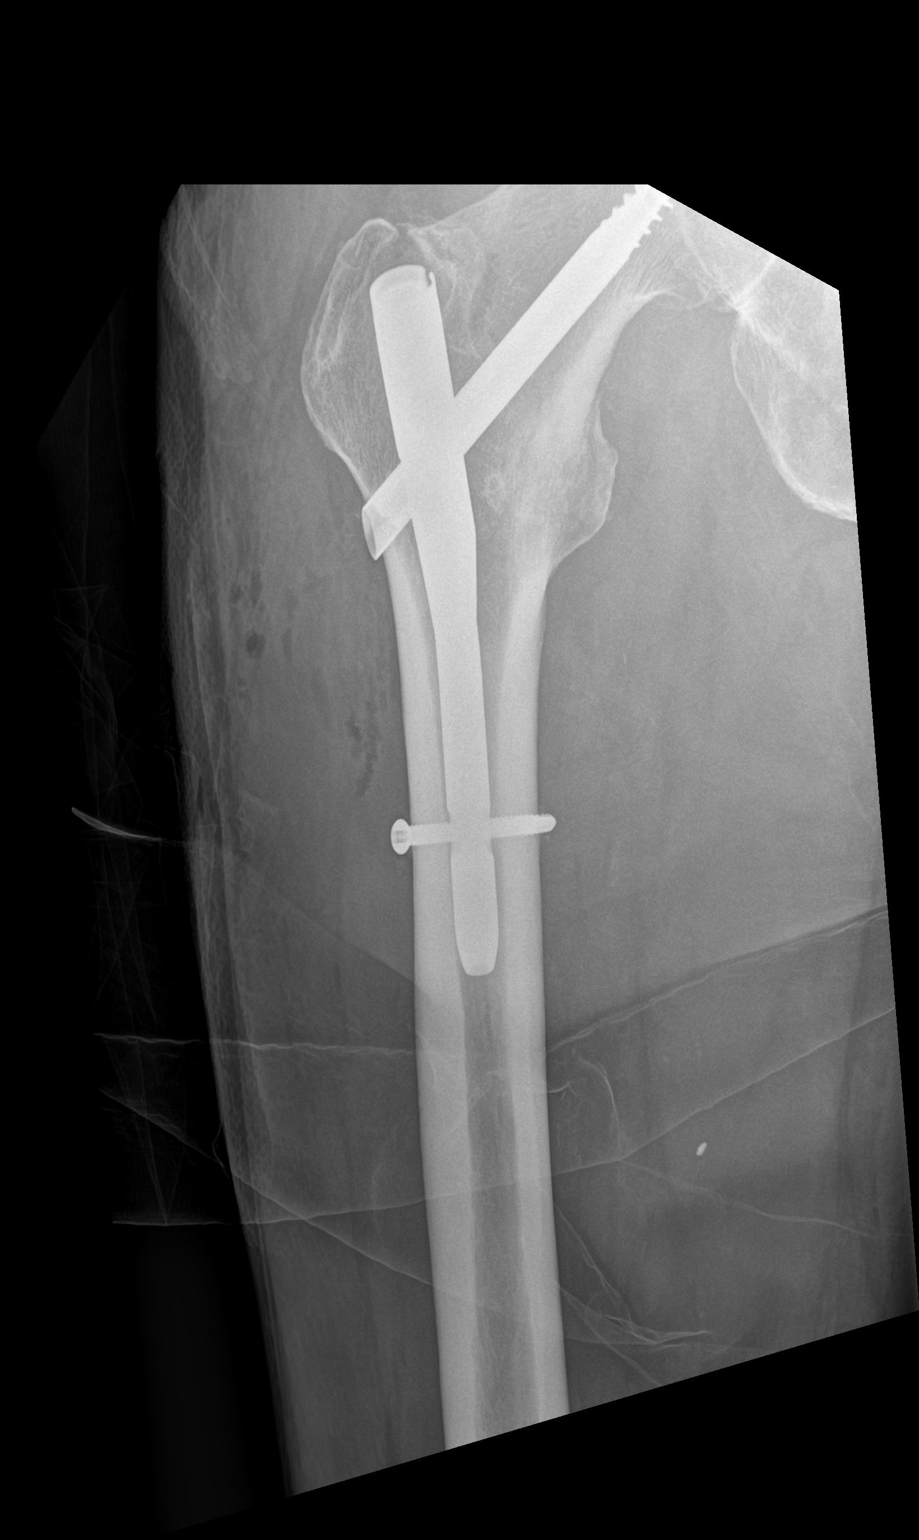

[hip lat]
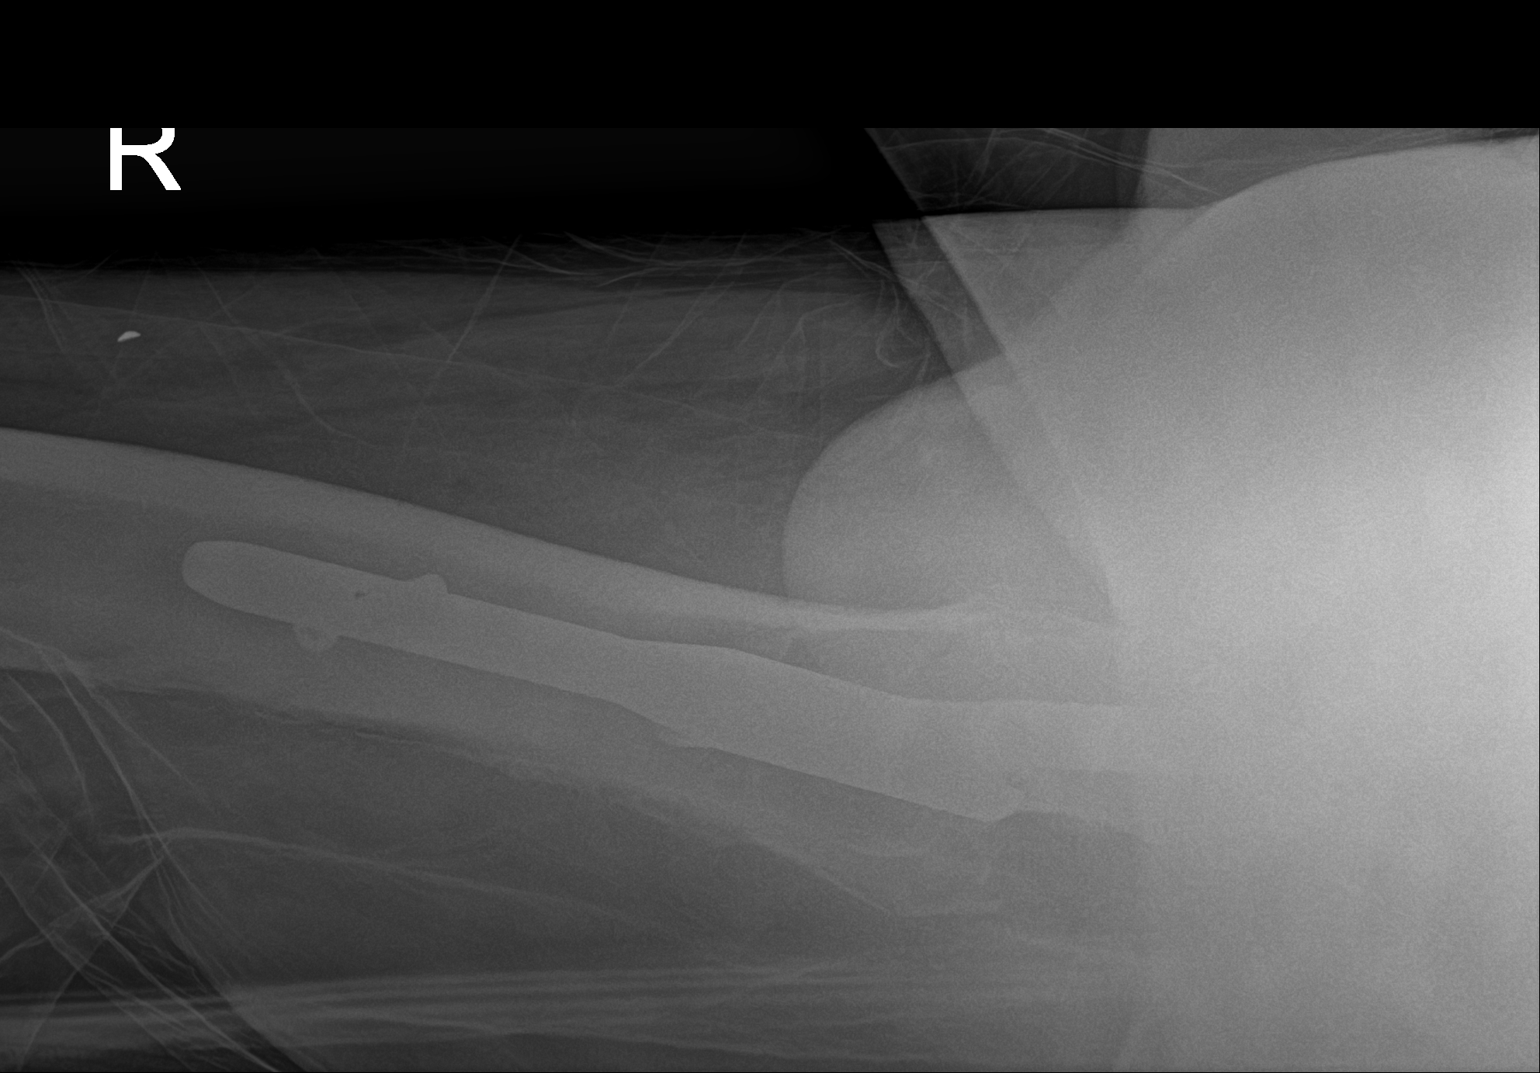

[3 of 3 positions shown; findings below may reference images not displayed]

FINDINGS: The patient has undergone intramedullary nail placement through the
right femur. The hardware is intact. The osseous alignment is
improved. There are degenerative changes of both hips and the lumbar
spine. Multiple calcifications project over the patient's pelvis and
are favored to represent phleboliths.
IMPRESSION: Status post intramedullary nail placement through the right femur.
The osseous alignment is improved.

## 2020-05-05 IMAGING — RF DG HIP (WITH PELVIS) OPERATIVE*R*
1 series · 4 of 4 positions shown · non-contrast
Comparison: [DATE]

CLINICAL DATA: Right hip fracture

EXAM:
OPERATIVE RIGHT HIP (WITH PELVIS IF PERFORMED) 4 VIEWS
TECHNIQUE: Fluoroscopic spot image(s) were submitted for interpretation
post-operatively.

[Series 1: run · 4 of 4 slices shown]
[im 1/4]
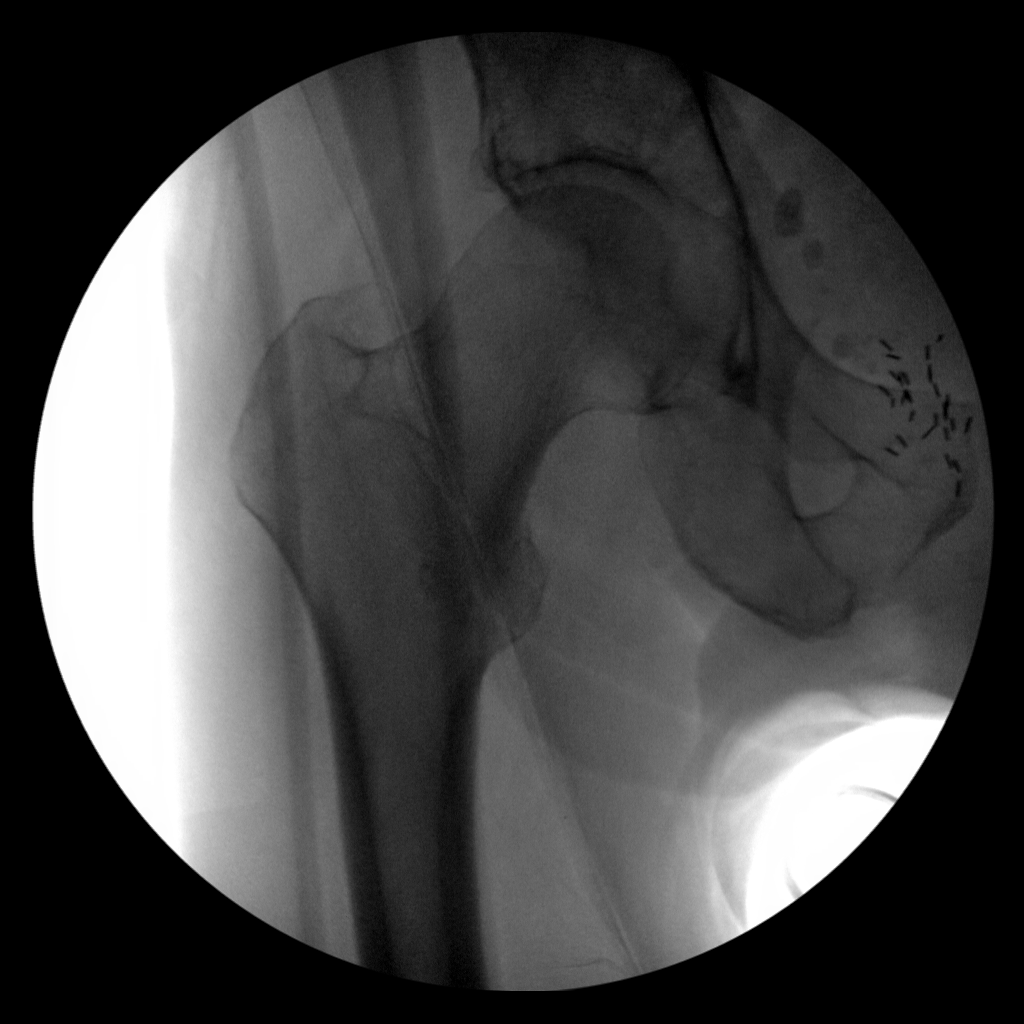
[im 2/4]
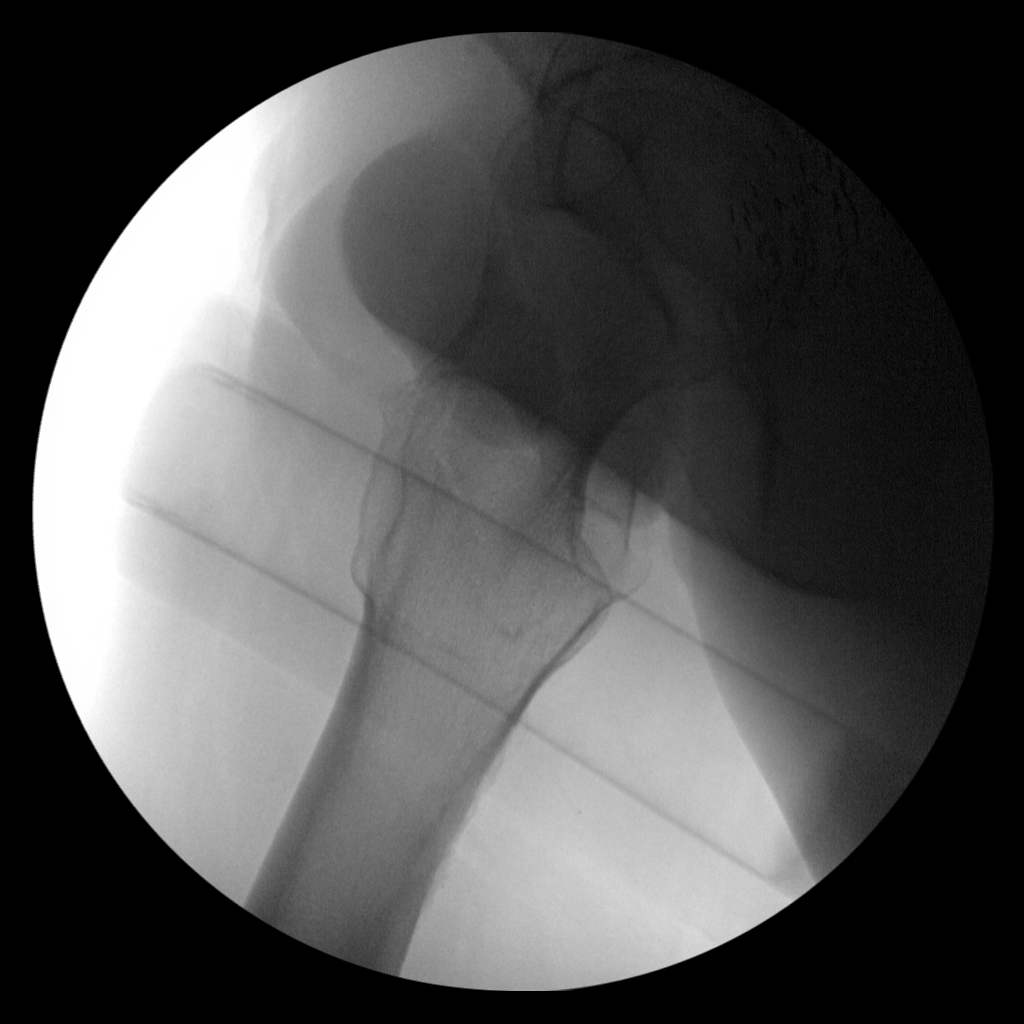
[im 3/4]
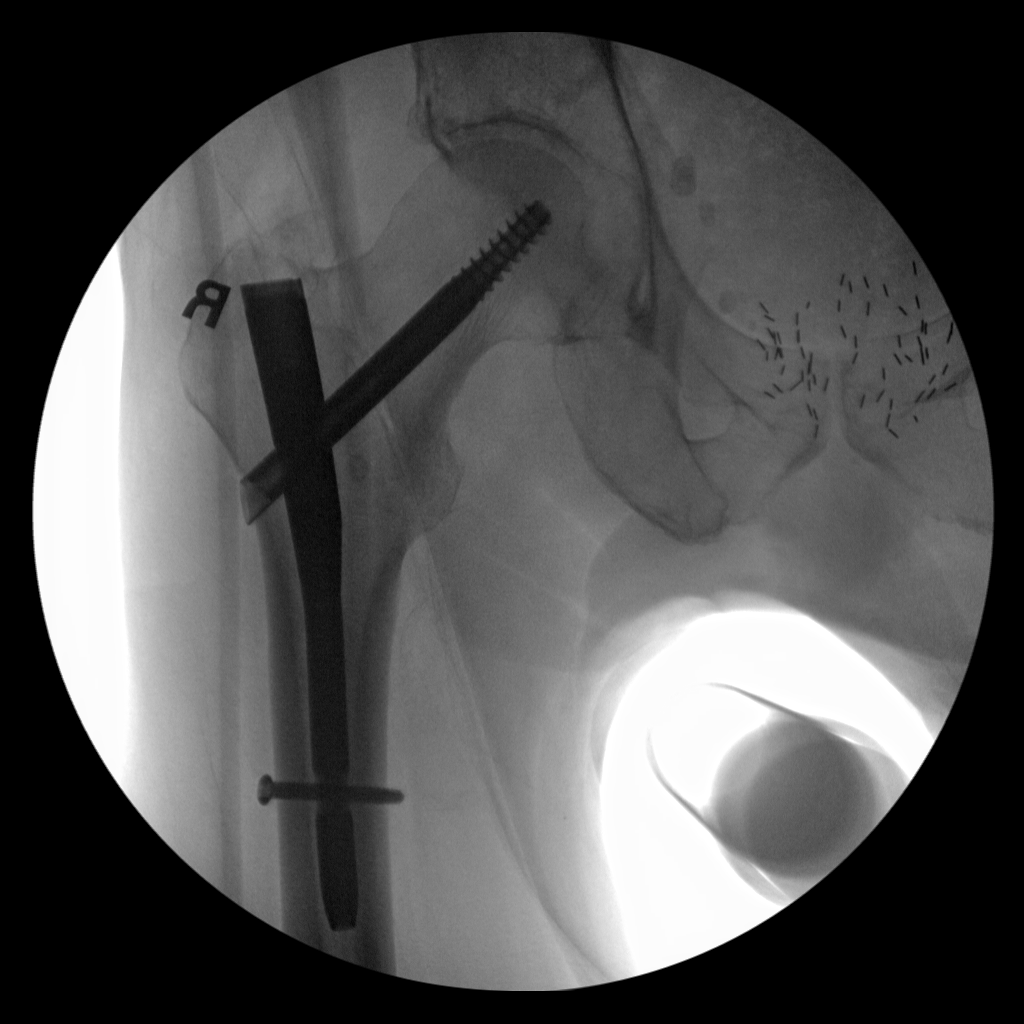
[im 4/4]
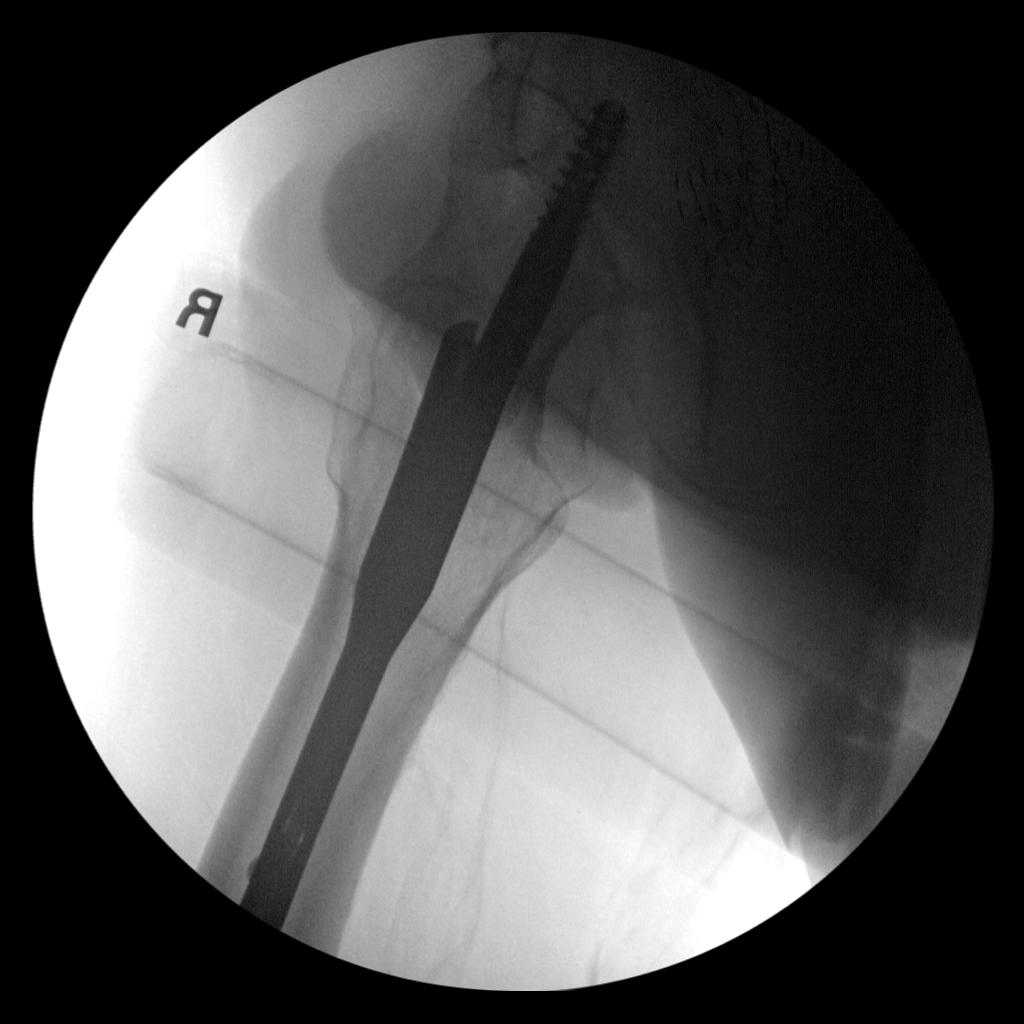

[4 of 4 positions shown; findings below may reference images not displayed]

FINDINGS: Internal fixation across the right femoral intertrochanteric
fracture. Anatomic alignment. No hardware complicating feature.
IMPRESSION: Internal fixation.  No visible complicating feature.

## 2020-05-05 IMAGING — RF DG C-ARM 1-60 MIN
1 series · 4 of 4 positions shown · non-contrast
Comparison: [DATE]

CLINICAL DATA: Right hip fracture

EXAM:
OPERATIVE RIGHT HIP (WITH PELVIS IF PERFORMED) 4 VIEWS
TECHNIQUE: Fluoroscopic spot image(s) were submitted for interpretation
post-operatively.

[Series 1: run · 4 of 4 slices shown]
[im 1/4]
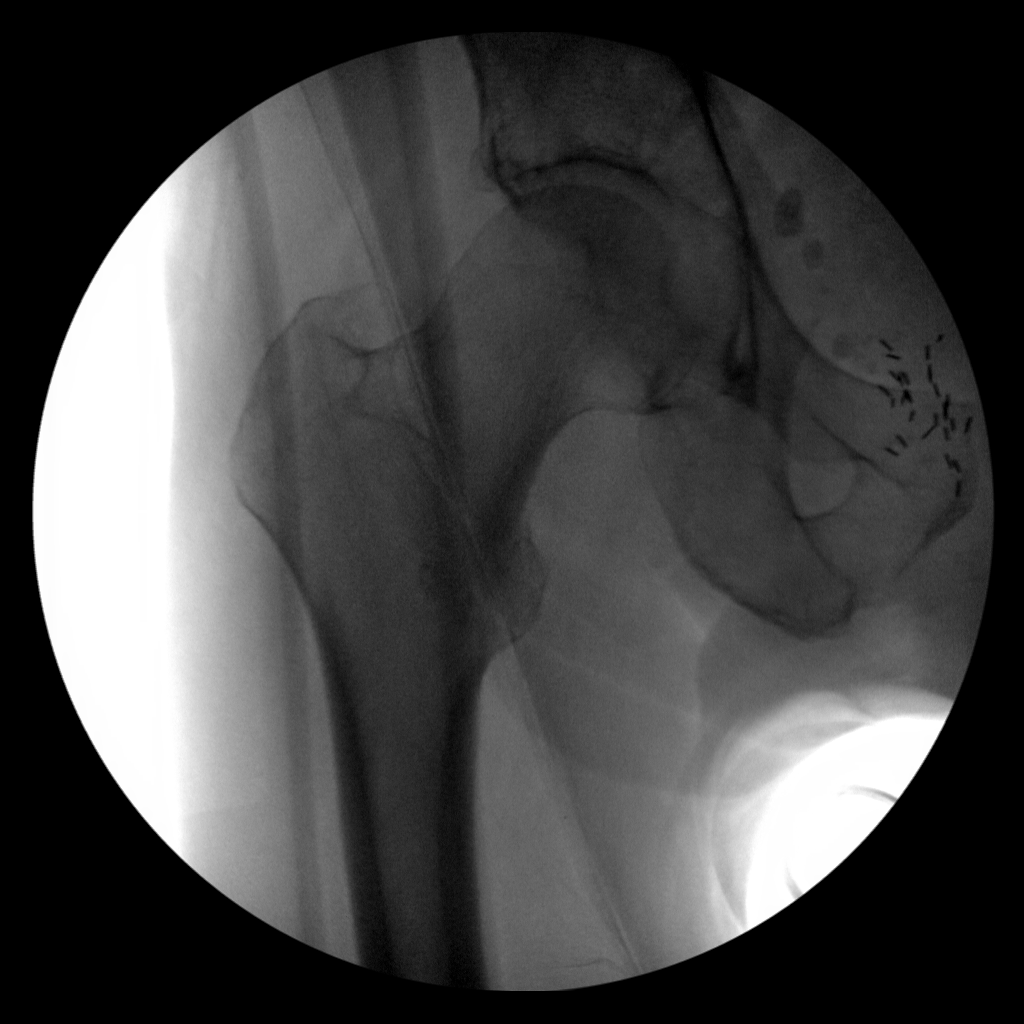
[im 2/4]
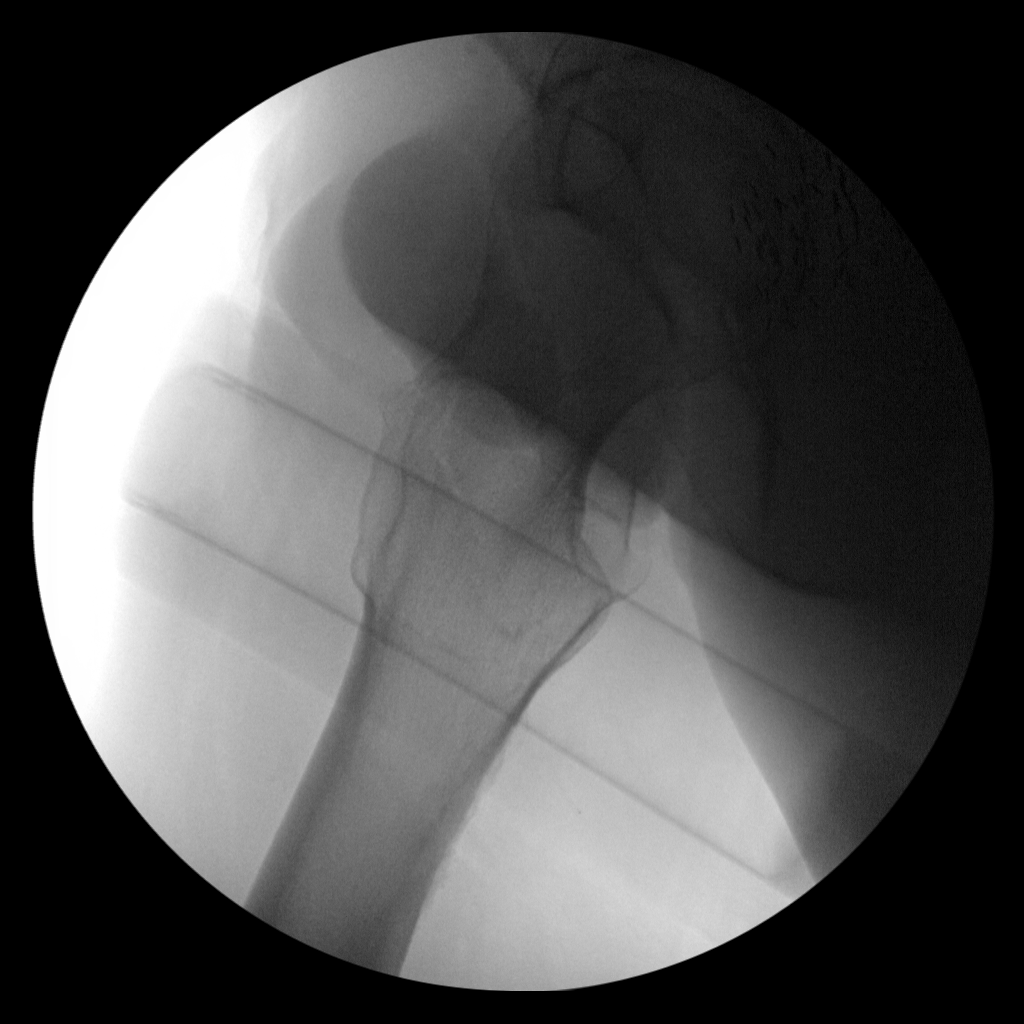
[im 3/4]
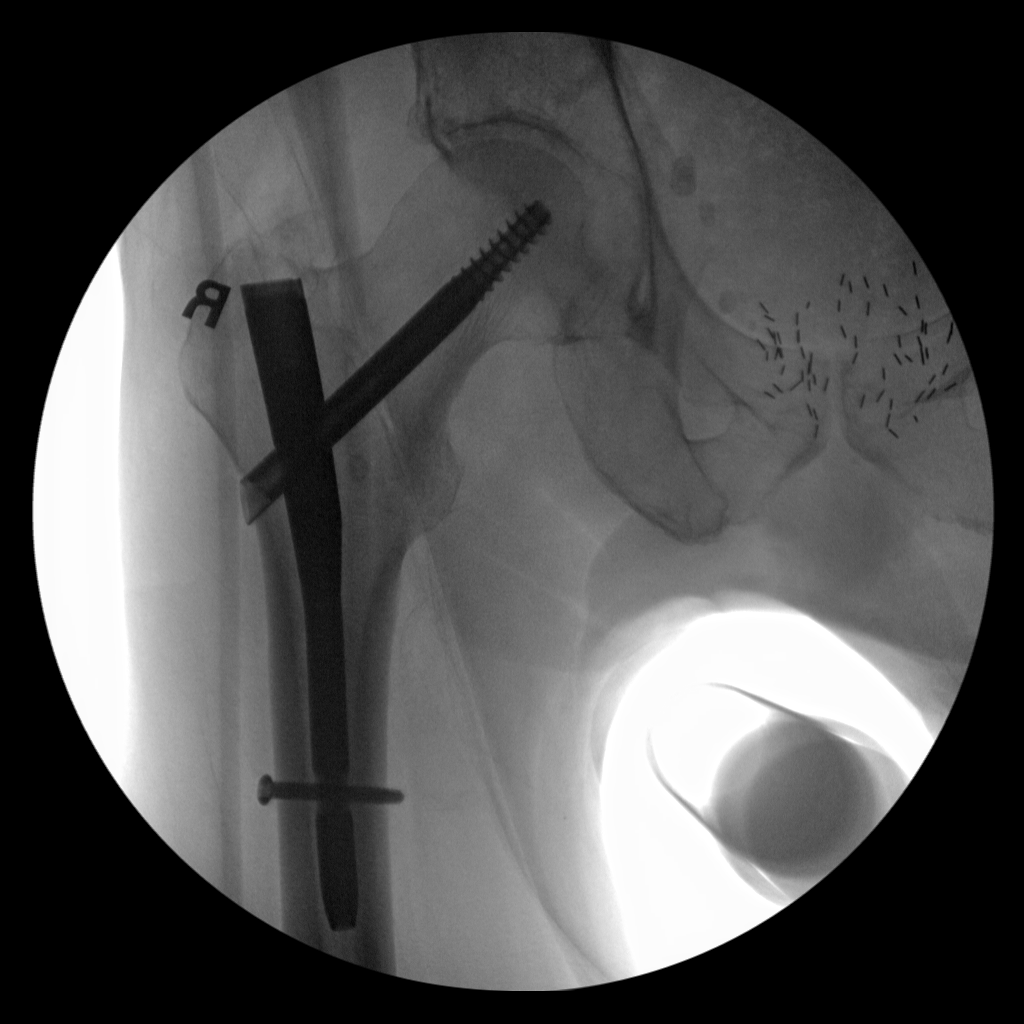
[im 4/4]
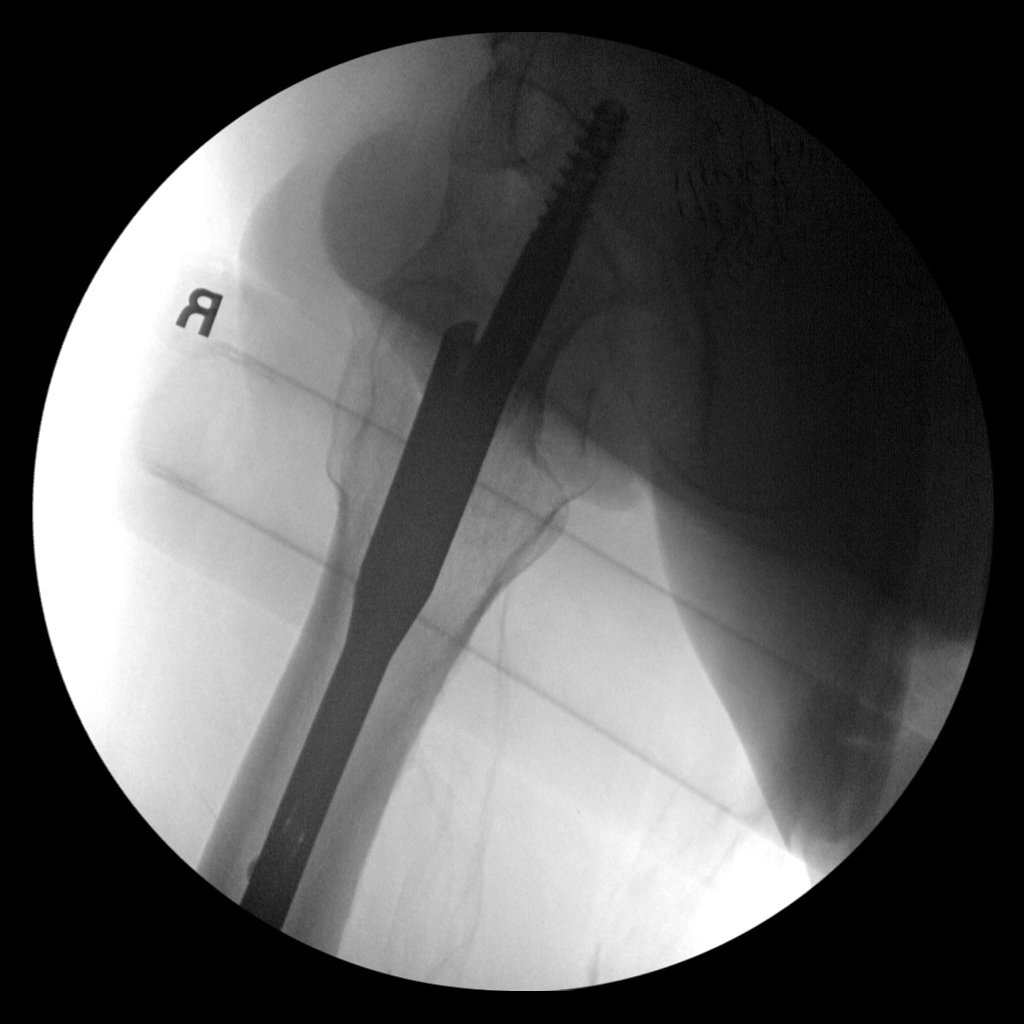

[4 of 4 positions shown; findings below may reference images not displayed]

FINDINGS: Internal fixation across the right femoral intertrochanteric
fracture. Anatomic alignment. No hardware complicating feature.
IMPRESSION: Internal fixation.  No visible complicating feature.

## 2020-05-05 SURGERY — FIXATION, FRACTURE, INTERTROCHANTERIC, WITH INTRAMEDULLARY ROD
Anesthesia: General | Site: Leg Upper | Laterality: Right

## 2020-05-05 MED ORDER — ACETAMINOPHEN 10 MG/ML IV SOLN
INTRAVENOUS | Status: DC | PRN
Start: 2020-05-05 — End: 2020-05-05
  Administered 2020-05-05: 1000 mg via INTRAVENOUS

## 2020-05-05 MED ORDER — CEFAZOLIN SODIUM-DEXTROSE 2-3 GM-%(50ML) IV SOLR
INTRAVENOUS | Status: DC | PRN
  Administered 2020-05-05: 2 g via INTRAVENOUS

## 2020-05-05 MED ORDER — ACETAMINOPHEN 325 MG PO TABS: 325.0000 mg | ORAL_TABLET | ORAL | Status: DC | PRN

## 2020-05-05 MED ORDER — MEPERIDINE HCL 25 MG/ML IJ SOLN
6.2500 mg | INTRAMUSCULAR | Status: DC | PRN
Start: 2020-05-05 — End: 2020-05-05

## 2020-05-05 MED ORDER — VANCOMYCIN HCL 1000 MG IV SOLR
INTRAVENOUS | Status: DC | PRN
  Administered 2020-05-05: 1000 mg via TOPICAL

## 2020-05-05 MED ORDER — SUGAMMADEX SODIUM 200 MG/2ML IV SOLN
INTRAVENOUS | Status: DC | PRN
  Administered 2020-05-05: 200 mg via INTRAVENOUS

## 2020-05-05 MED ORDER — OXYCODONE HCL 5 MG PO TABS
5.0000 mg | ORAL_TABLET | Freq: Once | ORAL | Status: DC | PRN
Start: 2020-05-05 — End: 2020-05-05

## 2020-05-05 MED ORDER — CHLORHEXIDINE GLUCONATE 0.12 % MT SOLN
15.0000 mL | OROMUCOSAL | Status: AC
  Filled 2020-05-05: qty 15

## 2020-05-05 MED ORDER — EPHEDRINE SULFATE-NACL 50-0.9 MG/10ML-% IV SOSY
PREFILLED_SYRINGE | INTRAVENOUS | Status: DC | PRN
  Administered 2020-05-05: 5 mg via INTRAVENOUS
  Administered 2020-05-05: 2.5 mg via INTRAVENOUS

## 2020-05-05 MED ORDER — 0.9 % SODIUM CHLORIDE (POUR BTL) OPTIME
TOPICAL | Status: DC | PRN
  Administered 2020-05-05: 1000 mL

## 2020-05-05 MED ORDER — FENTANYL CITRATE (PF) 100 MCG/2ML IJ SOLN
INTRAMUSCULAR | Status: AC
  Filled 2020-05-05: qty 2

## 2020-05-05 MED ORDER — PROPOFOL 10 MG/ML IV BOLUS
INTRAVENOUS | Status: DC | PRN
  Administered 2020-05-05: 50 mg via INTRAVENOUS

## 2020-05-05 MED ORDER — SODIUM CHLORIDE 0.9 % IV SOLN: INTRAVENOUS | Status: DC | PRN

## 2020-05-05 MED ORDER — METHOCARBAMOL 500 MG PO TABS
500.0000 mg | ORAL_TABLET | Freq: Four times a day (QID) | ORAL | Status: DC | PRN
  Administered 2020-05-06 – 2020-05-07 (×4): 500 mg via ORAL
  Filled 2020-05-05 (×4): qty 1

## 2020-05-05 MED ORDER — ACETAMINOPHEN 160 MG/5ML PO SOLN: 325.0000 mg | ORAL | Status: DC | PRN

## 2020-05-05 MED ORDER — CHLORHEXIDINE GLUCONATE 0.12 % MT SOLN
OROMUCOSAL | Status: AC
  Administered 2020-05-05: 15 mL via OROMUCOSAL
  Filled 2020-05-05: qty 15

## 2020-05-05 MED ORDER — PROPOFOL 10 MG/ML IV BOLUS
INTRAVENOUS | Status: AC
  Filled 2020-05-05: qty 20

## 2020-05-05 MED ORDER — PHENYLEPHRINE 40 MCG/ML (10ML) SYRINGE FOR IV PUSH (FOR BLOOD PRESSURE SUPPORT)
PREFILLED_SYRINGE | INTRAVENOUS | Status: DC | PRN
  Administered 2020-05-05: 80 ug via INTRAVENOUS
  Administered 2020-05-05: 160 ug via INTRAVENOUS
  Administered 2020-05-05: 80 ug via INTRAVENOUS

## 2020-05-05 MED ORDER — ACETAMINOPHEN 10 MG/ML IV SOLN
INTRAVENOUS | Status: AC
  Filled 2020-05-05: qty 100

## 2020-05-05 MED ORDER — FENTANYL CITRATE (PF) 100 MCG/2ML IJ SOLN
25.0000 ug | INTRAMUSCULAR | Status: DC | PRN
  Administered 2020-05-05 (×2): 50 ug via INTRAVENOUS

## 2020-05-05 MED ORDER — STERILE WATER FOR IRRIGATION IR SOLN
Status: DC | PRN
Start: 2020-05-05 — End: 2020-05-05
  Administered 2020-05-05: 1000 mL

## 2020-05-05 MED ORDER — ONDANSETRON HCL 4 MG/2ML IJ SOLN
INTRAMUSCULAR | Status: DC | PRN
  Administered 2020-05-05: 4 mg via INTRAVENOUS

## 2020-05-05 MED ORDER — PHENYLEPHRINE HCL-NACL 10-0.9 MG/250ML-% IV SOLN
INTRAVENOUS | Status: DC | PRN
  Administered 2020-05-05: 25 ug/min via INTRAVENOUS

## 2020-05-05 MED ORDER — INFLUENZA VAC A&B SA ADJ QUAD 0.5 ML IM PRSY
0.5000 mL | PREFILLED_SYRINGE | INTRAMUSCULAR | Status: DC
  Filled 2020-05-05: qty 0.5

## 2020-05-05 MED ORDER — OXYCODONE HCL 5 MG/5ML PO SOLN: 5.0000 mg | Freq: Once | ORAL | Status: DC | PRN

## 2020-05-05 MED ORDER — LACTATED RINGERS IV SOLN: INTRAVENOUS | Status: AC

## 2020-05-05 MED ORDER — PROSOURCE PLUS PO LIQD
30.0000 mL | Freq: Two times a day (BID) | ORAL | Status: DC
  Administered 2020-05-06: 30 mL via ORAL
  Filled 2020-05-05 (×2): qty 30

## 2020-05-05 MED ORDER — FENTANYL CITRATE (PF) 250 MCG/5ML IJ SOLN
INTRAMUSCULAR | Status: AC
  Filled 2020-05-05: qty 5

## 2020-05-05 MED ORDER — VANCOMYCIN HCL 1000 MG IV SOLR
INTRAVENOUS | Status: AC
  Filled 2020-05-05: qty 1000

## 2020-05-05 MED ORDER — CHLORHEXIDINE GLUCONATE CLOTH 2 % EX PADS
6.0000 | MEDICATED_PAD | Freq: Every day | CUTANEOUS | Status: DC
  Administered 2020-05-05 – 2020-05-07 (×3): 6 via TOPICAL

## 2020-05-05 MED ORDER — ETOMIDATE 2 MG/ML IV SOLN
INTRAVENOUS | Status: DC | PRN
  Administered 2020-05-05: 20 mg via INTRAVENOUS

## 2020-05-05 MED ORDER — FENTANYL CITRATE (PF) 100 MCG/2ML IJ SOLN
INTRAMUSCULAR | Status: DC | PRN
  Administered 2020-05-05: 50 ug via INTRAVENOUS
  Administered 2020-05-05: 100 ug via INTRAVENOUS
  Administered 2020-05-05 (×2): 50 ug via INTRAVENOUS

## 2020-05-05 MED ORDER — ONDANSETRON HCL 4 MG/2ML IJ SOLN: 4.0000 mg | Freq: Once | INTRAMUSCULAR | Status: DC | PRN

## 2020-05-05 MED ORDER — DEXAMETHASONE SODIUM PHOSPHATE 10 MG/ML IJ SOLN
INTRAMUSCULAR | Status: DC | PRN
  Administered 2020-05-05: 10 mg via INTRAVENOUS

## 2020-05-05 MED ORDER — ROCURONIUM BROMIDE 10 MG/ML (PF) SYRINGE
PREFILLED_SYRINGE | INTRAVENOUS | Status: DC | PRN
  Administered 2020-05-05: 70 mg via INTRAVENOUS
  Administered 2020-05-05: 30 mg via INTRAVENOUS

## 2020-05-05 MED ORDER — PEDIASURE PEPTIDE 1.0 CAL PO LIQD
237.0000 mL | Freq: Two times a day (BID) | ORAL | Status: DC
  Administered 2020-05-07 (×2): 237 mL via ORAL
  Filled 2020-05-05 (×4): qty 237

## 2020-05-05 MED ORDER — LIDOCAINE 2% (20 MG/ML) 5 ML SYRINGE
INTRAMUSCULAR | Status: DC | PRN
  Administered 2020-05-05: 100 mg via INTRAVENOUS

## 2020-05-05 MED ORDER — LACTATED RINGERS IV SOLN: INTRAVENOUS | Status: DC | PRN

## 2020-05-05 SURGICAL SUPPLY — 55 items
BIT DRILL FLUTED FEMUR 4.2/3 (BIT) ×2 IMPLANT
BIT DRILL SHORT 4.0 (BIT) IMPLANT
BRUSH SCRUB EZ PLAIN DRY (MISCELLANEOUS) ×6 IMPLANT
CHLORAPREP W/TINT 26 (MISCELLANEOUS) ×3 IMPLANT
COVER PERINEAL POST (MISCELLANEOUS) ×3 IMPLANT
COVER SURGICAL LIGHT HANDLE (MISCELLANEOUS) ×3 IMPLANT
COVER WAND RF STERILE (DRAPES) IMPLANT
DERMABOND ADVANCED (GAUZE/BANDAGES/DRESSINGS) ×2
DERMABOND ADVANCED .7 DNX12 (GAUZE/BANDAGES/DRESSINGS) ×1 IMPLANT
DRAPE C-ARM 35X43 STRL (DRAPES) ×3 IMPLANT
DRAPE IMP U-DRAPE 54X76 (DRAPES) ×6 IMPLANT
DRAPE INCISE IOBAN 66X45 STRL (DRAPES) ×3 IMPLANT
DRAPE STERI IOBAN 125X83 (DRAPES) ×3 IMPLANT
DRAPE SURG 17X23 STRL (DRAPES) ×6 IMPLANT
DRAPE U-SHAPE 47X51 STRL (DRAPES) ×3 IMPLANT
DRILL BIT SHORT 4.0 (BIT) ×2
DRSG MEPILEX BORDER 4X4 (GAUZE/BANDAGES/DRESSINGS) ×3 IMPLANT
DRSG MEPILEX BORDER 4X8 (GAUZE/BANDAGES/DRESSINGS) ×3 IMPLANT
ELECT REM PT RETURN 9FT ADLT (ELECTROSURGICAL) ×3
ELECTRODE REM PT RTRN 9FT ADLT (ELECTROSURGICAL) ×1 IMPLANT
GLOVE BIO SURGEON STRL SZ 6.5 (GLOVE) ×6 IMPLANT
GLOVE BIO SURGEON STRL SZ7.5 (GLOVE) ×12 IMPLANT
GLOVE BIO SURGEONS STRL SZ 6.5 (GLOVE) ×3
GLOVE BIOGEL PI IND STRL 6.5 (GLOVE) ×1 IMPLANT
GLOVE BIOGEL PI IND STRL 7.5 (GLOVE) ×1 IMPLANT
GLOVE BIOGEL PI INDICATOR 6.5 (GLOVE) ×2
GLOVE BIOGEL PI INDICATOR 7.5 (GLOVE) ×2
GLOVE SRG 8 PF TXTR STRL LF DI (GLOVE) IMPLANT
GLOVE SURG UNDER POLY LF SZ8 (GLOVE) ×2
GOWN STRL REUS W/ TWL LRG LVL3 (GOWN DISPOSABLE) ×1 IMPLANT
GOWN STRL REUS W/TWL LRG LVL3 (GOWN DISPOSABLE) ×2
GUIDE PIN 3.2X343 (PIN) ×2
GUIDE PIN 3.2X343MM (PIN) ×4
GUIDE ROD 3.0MMX1000MM  SMINEP (Rod) ×2 IMPLANT
GUIDE ROD 3.0MMX1000MM SMINEP (Rod) ×1 IMPLANT
GUIDEWIRE 3.2X400 (WIRE) ×4 IMPLANT
KIT BASIN OR (CUSTOM PROCEDURE TRAY) ×3 IMPLANT
KIT TURNOVER KIT B (KITS) ×3 IMPLANT
MANIFOLD NEPTUNE II (INSTRUMENTS) ×3 IMPLANT
NAIL TROCH FIX 10X170 130 (Nail) ×2 IMPLANT
NS IRRIG 1000ML POUR BTL (IV SOLUTION) ×3 IMPLANT
PACK GENERAL/GYN (CUSTOM PROCEDURE TRAY) ×3 IMPLANT
PAD ARMBOARD 7.5X6 YLW CONV (MISCELLANEOUS) ×4 IMPLANT
PIN GUIDE 3.2X343MM (PIN) IMPLANT
ROD GUIDE 3.0MMX1000MM SMINEP (Rod) IMPLANT
SCREW FENS TFNA 115 (Screw) ×2 IMPLANT
SCREW LOCK STAR 5X40 (Screw) ×2 IMPLANT
SUT MNCRL AB 3-0 PS2 18 (SUTURE) ×3 IMPLANT
SUT MNCRL AB 3-0 PS2 27 (SUTURE) ×2 IMPLANT
SUT VIC AB 0 CT1 27 (SUTURE) ×2
SUT VIC AB 0 CT1 27XBRD ANBCTR (SUTURE) IMPLANT
SUT VIC AB 2-0 CT1 27 (SUTURE) ×4
SUT VIC AB 2-0 CT1 TAPERPNT 27 (SUTURE) ×2 IMPLANT
TOWEL GREEN STERILE (TOWEL DISPOSABLE) ×6 IMPLANT
WATER STERILE IRR 1000ML POUR (IV SOLUTION) ×3 IMPLANT

## 2020-05-05 NOTE — Progress Notes (Addendum)
Initial Nutrition Assessment  DOCUMENTATION CODES:   Not applicable  INTERVENTION:   When diet advanced:   Pediasure BID, per patient request. Each supplement provides 240 kcals and 7 grams of protein  PROSource Plus BID, each supplement provides 100 kcals, and 15 grams of protein  If poor po intake persists following surgery, recommend liberalizing diet to regular to allow for more choices.   NUTRITION DIAGNOSIS:   Increased nutrient needs related to post-op healing as evidenced by estimated needs.  GOAL:   Patient will meet greater than or equal to 90% of their needs  MONITOR:   PO intake,Supplement acceptance,Labs,Weight trends,Skin  REASON FOR ASSESSMENT:   Malnutrition Screening Tool    ASSESSMENT:   69 y.o. male presenting with a R intertrochanteric hip fracture. PMH significant for HFrEF, AFib, RA, HTN, Depression/Anxiety, and multiple toe amputations 2/2 osteomyelitis. Plan for IMN today.  Spoke with pt at bedside. Wife present during visit. Pt reports he has had a decreased appetite for 3 days, following his fall at home. Pt reports his appetite has been slowly increasing since multiple hospital admissions within the past 4 months. Pt reports during these recent admissions he was not eating much because he does not like hospital food. He reports he usually eats 3 meals a day at home. Typical intake: B:  at waffle house- hashbrowns, bacon, sausage, and an occasional waffle, L: Sandwich or snack items, D: meat, starch, vegetable. Pt reports he has tried Ensure and Boost and does not like them. He reports the only protein drink he has found to tolerate is Pediasure. He drinks 1 Pediasure a day at home. Educated pt on the importance of adequate protein intake following surgery. Pt agreeable to trying Prosource Plus and Pediasure while admitted.   Pt reports he has experienced weight loss during his recent numerous admissions but has had a recent weight gain of around 20  lbs. Pt reports he used to weight 264 lbs but has not been at this weight for quite some time. He reports his weight dropped to 178 lbs following his toe amputations in December, but he has been trying to gain weight since this. He reports he now weighs around 202-209 lbs since his last surgery. Per chart review, pt weighed 217 lbs in 12/2019. His weight on 03/28/20 was 189 lbs. His current weight is 209 lbs.   Pt denies the use of assistive devices for ambulating. He reports he wears large post-op shoes, which makes it difficult to walk.    Labs reviewed.   Medications reviewed and include: Prednisone, Senokot, lisinopril   NUTRITION - FOCUSED PHYSICAL EXAM:  Flowsheet Row Most Recent Value  Orbital Region No depletion  Upper Arm Region Mild depletion  Thoracic and Lumbar Region No depletion  Buccal Region No depletion  Temple Region Mild depletion  Clavicle Bone Region Mild depletion  Clavicle and Acromion Bone Region Moderate depletion  Scapular Bone Region Unable to assess  Dorsal Hand No depletion  Patellar Region Mild depletion  Anterior Thigh Region No depletion  Posterior Calf Region Mild depletion  Edema (RD Assessment) None  Hair Reviewed  Eyes Reviewed  Mouth Reviewed  Skin Reviewed  Nails Reviewed       Diet Order:   Diet Order            Diet NPO time specified  Diet effective midnight                 EDUCATION NEEDS:   Education needs have  been addressed  Skin:  Skin Assessment: Reviewed RN Assessment  Last BM:  05/03/2020  Height:   Ht Readings from Last 1 Encounters:  05/04/20 6\' 1"  (1.854 m)    Weight:   Wt Readings from Last 1 Encounters:  05/04/20 94.8 kg     BMI:  Body mass index is 27.57 kg/m.  Estimated Nutritional Needs:   Kcal:  2100-2300 kcals  Protein:  120-135 grams  Fluid:  > 2 L/day    Ronnald Nian, Dietetic Intern Pager: [phone] If unavailable: [phone]

## 2020-05-05 NOTE — Progress Notes (Signed)
OT Cancellation Note  Patient Details Name: Thomas [name] MRN: [id number] DOB: 1950-06-30   Cancelled Treatment:    Reason Eval/Treat Not Completed: Active bedrest order. Patient currently on bedrest due to planned procedure today (1/12). OT will follow up when pt is medically ready   Britt Bottom 05/05/2020, 9:13 AM

## 2020-05-05 NOTE — Progress Notes (Signed)
ANTICOAGULATION CONSULT NOTE - Initial Consult  Pharmacy Consult for apixaban Indication: fracture care post-operative   Allergies  Allergen Reactions  . Diltiazem Hcl Swelling  . Chlorthalidone Other (See Comments)    "Makes me light-headed and I don't like the way it makes me feel"  . Voltaren [Diclofenac] Rash    Patient Measurements: Height: 6\' 1"  (185.4 cm) Weight: 94.8 kg (209 lb) IBW/kg (Calculated) : 79.9 Heparin Dosing Weight: 94.8 kg  Vital Signs: Temp: 97.9 F (36.6 C) (01/12 2145) Temp Source: Oral (01/12 2145) BP: 157/85 (01/12 2145) Pulse Rate: 65 (01/12 2145)  Labs: Recent Labs    05/04/20 1120 05/05/20 0218  HGB 12.2* 12.0*  HCT 38.3* 36.5*  PLT 193 184  LABPROT 14.1  --   INR 1.1  --   CREATININE 0.65 0.62    Estimated Creatinine Clearance: 101.3 mL/min (by C-G formula based on SCr of 0.62 mg/dL).   Medical History: Past Medical History:  Diagnosis Date  . Atrial flutter ([place]) 01/2020  . Cancer [facility])    prostate  . Claustrophobia    quite severe  . Heart failure with reduced ejection fraction ([place])   . Hypertension   . Hypoglycemia    occ  . Left knee DJD    Xray 12/23/08  . NICM (nonischemic cardiomyopathy) ([place]) 02/12/2020  . Prostate cancer ([place])     Medications:  Scheduled:  . [START ON 05/06/2020] (feeding supplement) PROSource Plus  30 mL Oral BID BM  . amiodarone  200 mg Oral Daily  . atorvastatin  40 mg Oral q1800  . Chlorhexidine Gluconate Cloth  6 each Topical Daily  . diazepam  5 mg Oral QHS  . DULoxetine  60 mg Oral Daily  . [START ON 05/06/2020] feeding supplement (PEDIASURE PEPTIDE 1.0 CAL)  237 mL Oral BID BM  . fentaNYL      . gabapentin  300 mg Oral TID  . [START ON 05/06/2020] influenza vaccine adjuvanted  0.5 mL Intramuscular Tomorrow-1000  . lisinopril  10 mg Oral Daily  . melatonin  10 mg Oral QHS  . predniSONE  10 mg Oral Q breakfast  . senna  1 tablet Oral BID    Assessment: 52 yom who had a ground level  fall on R leg now s/p nailing of R intertrochanteric femur fx. On apixaban PTA for Afib (LD 1/10).  Consult for pharmacy to restart PTA DOAC on POD1 if no s/sx of bleeding, no transfusion requirements, and Hgb >/= 9.5 tomorrow.  Hgb 12, plt 184 this morning.  Goal of Therapy:  Monitor platelets by anticoagulation protocol: Yes   Plan:  No anticoagulation tonight F/u in AM for DOAC restart   Antonietta Jewel, PharmD, [place] Pharmacist  Phone: [phone] 05/05/2020 10:24 PM  Please check AMION for all [place] phone numbers After 10:00 PM, call [place] [phone]

## 2020-05-05 NOTE — Plan of Care (Signed)
  Problem: Education: Goal: Knowledge of General Education information will improve Description: Including pain rating scale, medication(s)/side effects and non-pharmacologic comfort measures Outcome: Progressing   Problem: Clinical Measurements: Goal: Respiratory complications will improve Outcome: Progressing   Problem: Activity: Goal: Risk for activity intolerance will decrease Outcome: Progressing   Problem: Nutrition: Goal: Adequate nutrition will be maintained Outcome: Progressing   Problem: Elimination: Goal: Will not experience complications related to urinary retention Outcome: Progressing   Problem: Safety: Goal: Ability to remain free from injury will improve Outcome: Progressing

## 2020-05-05 NOTE — Progress Notes (Signed)
Family Medicine Teaching Service Daily Progress Note Intern Pager: [phone]  Patient name: Thomas [name] Medical record number: [id number] Date of birth: [birth year] Age: 70 y.o. Gender: male  Primary Care Provider: [name], MD Consultants: Ortho Code Status: Full  Pt Overview and Major Events to Date:  Admitted 05/05/20  Assessment and Plan: Thomas [name] is a 70 y.o. male presenting with a R intertrochanteric hip fracture. PMH is significant for HFrEF, AFib, RA, HTN, Depression/Anxiety, and multiple toe amputations 2/2 osteomyelitis.   R Hip Fracture, acute: Pt with acute R intertrochanteric hip fracture.  Indicates pain well controlled with Percocent 5-325 x2 q4hr.  Patient lower limbs good perfusion bilaterally.  Has surgery scheduled at 3 today. - Satrt on low dose IV LR given patient's NPO sdtatus - SCDs  For VTE prevention - Holding Eliquis until after surgery - Oxycodone-acetominophen 10-325mg  1-2 pills q4 hours for pain - IV dilaudid for breakthrough pain - Start Vit D and Ca suppl on d/c - f/u with Ortho for post-op recs  HFrEF, chronic, stable: Last echo in 10/21 with EF 10-15%, small PFO with L to R shunting, severely enlarged R ventrical, and mild mitral and tricuspid regurgitation. Home meds include torsemide 20mg  and 40 mEq KCl daily.  - Hold Torsemide and KCl for now  MSSA Osteomyelitis of R great toe s/p amputation, stable: Has a PICC and has been receiving Ancef 2g daily. Last dose scheduled for 05/08/20. Surgical wounds well-healed on exam. Patient with decreased sensation of R foot. Pt. Does not have a history of diabetes. Pt. Remains afebrile and without a white count.  - Continue Ancef until 1/15   Afib, chronic: Takes amiodarone 200mg  and eliquis at home.  - Hold Eliquis for procedure - Continue amiodarone 200mg  daily  Rheumatoid Arthritis: Takes percocet, oxycodone, gabapentin, and prednisone 10mg  daily at home.  Follow[facility]  rheumatology. - Pain control as above  - Continue Gabapentin 300mg  TID - Continue prednisone 10 mg daily  HTN, chronic: stable BP 145/81. Home meds include lisinopril 10mg  daily. - Continue home lisinopril 10mg .  Depression/Anxiety, chronic: On Valium and Cymbalta at home.  - Continue home meds  FEN/GI: NPO PPx: Holding Eliquis for surgery   Status is: Inpatient  Remains inpatient appropriate because:Inpatient level of care appropriate due to severity of illness   Dispo: The patient is from: Home              Anticipated d/c is to: Home              Anticipated d/c date is: 3 days              Patient currently is not medically stable to d/c.   Subjective:  Pain is well controlled this morning.  Indicates Fentanyl not hleping much but Oxycodone helping.  Denies chest pain, difficulty breathing, or palpitations.  Objective: Temp:  [97.5 F (36.4 C)-98.9 F (37.2 C)] 98.5 F (36.9 C) (01/12 0815) Pulse Rate:  [62-70] 63 (01/12 0815) Resp:  [15-18] 18 (01/12 0815) BP: (145-174)/(75-94) 145/81 (01/12 0815) SpO2:  [95 %-100 %] 99 % (01/12 0815) Physical Exam:  Physical Exam Constitutional:      General: He is not in acute distress.    Appearance: Normal appearance.  HENT:     Head: Normocephalic and atraumatic.     Mouth/Throat:     Mouth: Mucous membranes are moist.  Cardiovascular:     Rate and Rhythm: Normal rate and regular rhythm.  Comments: Pedal pulses thready, no signs of decreased perfusion Pulmonary:     Effort: Pulmonary effort is normal.     Breath sounds: Normal breath sounds.  Abdominal:     General: Abdomen is flat.     Palpations: Abdomen is soft.  Skin:    General: Skin is warm.  Neurological:     Mental Status: He is alert.  Psychiatric:        Mood and Affect: Mood normal.        Behavior: Behavior normal.     Laboratory: Recent Labs  Lab 05/04/20 1120 05/05/20 0218  WBC 8.1 6.3  HGB 12.2* 12.0*  HCT 38.3* 36.5*  PLT 193  184   Recent Labs  Lab 05/04/20 1120 05/05/20 0218  NA 137 137  K 3.5 3.5  CL 100 99  CO2 26 29  BUN 16 11  CREATININE 0.65 0.62  CALCIUM 9.1 8.8*  GLUCOSE 110* 92    Vit D- 26.81  Imaging/Diagnostic Tests: No new imaging  Delora Fuel, MD 05/05/2020, 1:00 PM PGY-1, [place] Intern pager: [phone], text pages welcome

## 2020-05-05 NOTE — Consult Note (Addendum)
Cardiology Consultation:   Patient ID: ALEK BORGES MRN: [id number]; DOB: 04-06-1951  Admit date: 05/04/2020 Date of Consult: 05/05/2020  Primary Care Provider: [name], MD [facility] Cardiologist: Freada Bergeron, MD  [facility] Electrophysiologist:  None    Patient Profile:   RUTH TULLY is a 70 y.o. male with a history of chronic systolic CHF/non-ischemic cardiomyopathy with EF of <20%, atrial flutter s/p DCCV in 01/2020 on Amiodarone and Eliquis, hypertension, recent osteomyelitis s/p multiple toe amputations, rheumatoid arthritis, prostate cancer, and anxiety/depression  who is being seen today for pre-op evaluation for acute right intertrochanteric hip fractures at the request of Dr. Ardelia Mems.  History of Present Illness:   Mr. Poitra is a 70 year old male with the above history who is followed by Dr. Johney Frame.   Patient was diagnosed with ostemylitis of his feet initially back in 11/2019. He has had multiple surgeries since then and has had 2 toe on his right foot amputated.  He was admitted from 02/02/2020 to 02/12/2020 for atrial flutter with RVR and newly diagnosed acute systolic CHF with gross volume overload. Echo showed LVEF of <20% with global hypokinesis, biatrial enlargement, mild MR, and moderate bilateral pleural effusions. RV mildly enlarged but systolic function normal. Patient was diuresed with IV Lasix and then underwent right/left cardiac catheterization on 02/09/2020 which showed minimal CAD, mildly reduced Fick cardiac output/index, and upper normal left/heart pressures. He then underwent successful TEE/DCCV on 02/11/2020. Following DCCV, he became somnolent and hypotensive requiring pressors but rapidly improved and these were able to be weaned off quickly. He was discharged on Torsemide 19m twice daily, Amiodarone 2040mtwice daily, and Eliquis 45m1mwice daily. Unfortunately BP limited our ability to add guideline directed medical  therapy for CHF.  Patient was seen by LorMarcellina MillinP, for follow-up on 03/31/2020. He reported having left foot surgery the week prior to this visit and had a PICC line placed for antibiotics. He was doing well from a cardiac standpoint. Amiodarone was decreased to 200m59mce daily and Torsemide was decreased to 20mg4me daily. Lisinopril 10mg 34m daily was added.  Patient presented to the ED on 05/05/2019 after a fall and was found to have a right intertrochanteric hip fractures. Cardiology consulted for pre-op evaluation.  At the time of this evaluation, patient resting comfortably in bed. He reports he has been doing really well from a cardiac standpoint since sinus rhythm was restored. He suffered a mechanical fall on Monday night. He states he was walking back inside after going to pick up antibiotics that had been delivered when he lost his balance in the surgical shoes that he was wearing and fell. He did not initially think much of the fall and thought he just had a mild injury. However, the next morning he was having severe pain and could not wall. He called EMS who brought him to the ED. He denies any symptoms prior to his fall. No chest pain, shortness of breath, orthopnea, PND, lower extremity edema, palpitations, lightheadedness, dizziness, or syncope. No abnormal bleeding in urine or stools.  Past Medical History:  Diagnosis Date  . Atrial flutter (HCC) 1[place]021  . [facility]  . Claustrophobia    quite severe  . Heart failure with reduced ejection fraction (HCC)  [place]   . Hypoglycemia    occ  . Left knee DJD    Xray 12/23/08  . NICM (nonischemic cardiomyopathy) (HCC) 1[place]1/2021  . Prostate cancer (  [facility])     Past Surgical History:  Procedure Laterality Date  . AMPUTATION TOE Right 12/2019  . AMPUTATION TOE Right 03/26/2020   Procedure: Right 3rd toe partial amputation, bone biopsy right 1st metatarsal;  Surgeon: Evelina Bucy, DPM;  Location: [facility];   Service: Podiatry;  Laterality: Right;  . BUBBLE STUDY  02/11/2020   Procedure: BUBBLE STUDY;  Surgeon: Geralynn Rile, MD;  Location: [place];  Service: Cardiovascular;;  . CARDIOVERSION N/A 02/11/2020   Procedure: CARDIOVERSION;  Surgeon: Geralynn Rile, MD;  Location: [place];  Service: Cardiovascular;  Laterality: N/A;  . CYSTOSCOPY  04/11/2018   Procedure: Erlene Quan;  Surgeon: [name], MD;  Location: [facility];  Service: Urology;;  NO SEEDS FOUND IN BLADDER  . HERNIA REPAIR  2009   inguinal  . IRRIGATION AND DEBRIDEMENT FOOT Left 03/26/2020   Procedure: Left foot incision and drainage with removal of all non-viable soft tissue and bone - areas overlying the 2nd and 5th metatarsals.;  Surgeon: Evelina Bucy, DPM;  Location: [facility];  Service: Podiatry;  Laterality: Left;  . JOINT REPLACEMENT Bilateral 2017   knees  . RADIOACTIVE SEED IMPLANT N/A 04/11/2018   Procedure: RADIOACTIVE SEED IMPLANT/BRACHYTHERAPY IMPLANT;  Surgeon: [name], MD;  Location: [facility];  Service: Urology;  Laterality: N/A;   71     SEEDS IMPLANTED  . RIGHT/LEFT HEART CATH AND CORONARY ANGIOGRAPHY N/A 02/09/2020   Procedure: RIGHT/LEFT HEART CATH AND CORONARY ANGIOGRAPHY;  Surgeon: Nelva Bush, MD;  Location: [place] [facility];  Service: Cardiovascular;  Laterality: N/A;  . SPACE OAR INSTILLATION N/A 04/11/2018   Procedure: SPACE OAR INSTILLATION;  Surgeon: [name], MD;  Location: [facility];  Service: Urology;  Laterality: N/A;  . TEE WITHOUT CARDIOVERSION N/A 02/11/2020   Procedure: TRANSESOPHAGEAL ECHOCARDIOGRAM (TEE);  Surgeon: Geralynn Rile, MD;  Location: [place];  Service: Cardiovascular;  Laterality: N/A;  . TOTAL KNEE ARTHROPLASTY Bilateral 09/13/2015   Procedure: BILATERAL KNEE ARTHROPLASTY ;  Surgeon: Paralee Cancel, MD;  Location: [facility];  Service: Orthopedics;   Laterality: Bilateral;     Home Medications:  Prior to Admission medications   Medication Sig Start Date End Date Taking? Authorizing Provider  amiodarone (PACERONE) 200 MG tablet Take 1 tablet (200 mg total) by mouth daily. 03/31/20  Yes Burtis Junes, NP  apixaban (ELIQUIS) 5 MG TABS tablet Take 1 tablet (5 mg total) by mouth 2 (two) times daily. 03/31/20  Yes Burtis Junes, NP  atorvastatin (LIPITOR) 40 MG tablet Take 1 tablet (40 mg total) by mouth daily. 03/31/20  Yes Burtis Junes, NP  diazepam (VALIUM) 5 MG tablet Take 5 mg by mouth at bedtime. 04/15/20  Yes [provider]  DULoxetine (CYMBALTA) 60 MG capsule Take 1 capsule (60 mg total) by mouth daily. 03/24/20  Yes Chambliss, Jeb Levering, MD  lisinopril (ZESTRIL) 10 MG tablet Take 1 tablet (10 mg total) by mouth daily. 03/31/20 06/29/20 Yes Burtis Junes, NP  Melatonin 10 MG TABS Take 10 mg by mouth at bedtime.   Yes [provider]  oxyCODONE (ROXICODONE) 15 MG immediate release tablet Take 1 tablet (15 mg total) by mouth every 4 (four) hours as needed for pain. 03/30/20  Yes Evelina Bucy, DPM  oxyCODONE-acetaminophen (PERCOCET) 10-325 MG tablet 1-2 tablets as needed every 4 hours for pain. This prescription must last 1 week. Do not take more than prescribed. Patient  taking differently: Take 1-2 tablets by mouth every 4 (four) hours as needed for pain. 04/28/20 05/05/20 Yes Stover, Titorya, DPM  potassium chloride SA (KLOR-CON M20) 20 MEQ tablet Take 2 tablets (40 mEq total) by mouth daily. 03/31/20  Yes Burtis Junes, NP  predniSONE (DELTASONE) 10 MG tablet Take 10 mg by mouth daily with breakfast.    Yes [provider]  torsemide (DEMADEX) 20 MG tablet Take 1 tablet (20 mg total) by mouth daily. 03/31/20  Yes Burtis Junes, NP  ceFAZolin (ANCEF) IVPB Inject 2 g into the vein every 8 (eight) hours. Indication:  Osteomyelitis First Dose: No Last Day of Therapy:  05/08/2020 Labs - Once weekly:  CBC/D  and BMP, Labs - Every other week:  ESR and CRP Method of administration: IV Push Method of administration may be changed at the discretion of home infusion pharmacist based upon assessment of the patient and/or caregiver's ability to self-administer the medication ordered. 03/29/20 05/08/20  Antonieta Pert, MD  gabapentin (NEURONTIN) 300 MG capsule Take 1 capsule (300 mg total) by mouth 3 (three) times daily. To help with pain that is not relieved by pain medicatoin 04/26/20   Landis Martins, DPM  NARCAN 4 MG/0.1ML LIQD nasal spray kit Place 1 spray into the nose as needed (as directed for emergency). 02/11/20   Darreld Mclean, PA-C  oxyCODONE-acetaminophen (PERCOCET) 10-325 MG tablet Take 1-2 tablets by mouth every 4 (four) hours as needed for pain (as needed). 05/04/20   Felipa Furnace, DPM    Inpatient Medications: Scheduled Meds: . amiodarone  200 mg Oral Daily  . atorvastatin  40 mg Oral q1800  . Chlorhexidine Gluconate Cloth  6 each Topical Daily  . diazepam  5 mg Oral QHS  . DULoxetine  60 mg Oral Daily  . [START ON 05/06/2020] feeding supplement (PEDIASURE PEPTIDE 1.0 CAL)  237 mL Oral BID BM  . gabapentin  300 mg Oral TID  . [START ON 05/06/2020] influenza vaccine adjuvanted  0.5 mL Intramuscular Tomorrow-1000  . lisinopril  10 mg Oral Daily  . melatonin  10 mg Oral QHS  . povidone-iodine  2 application Topical Once  . predniSONE  10 mg Oral Q breakfast  . senna  1 tablet Oral BID   Continuous Infusions: . sodium chloride    .  ceFAZolin (ANCEF) IV    .  ceFAZolin (ANCEF) IV Stopped (05/05/20 0557)  . lactated ringers 50 mL/hr at 05/05/20 1308   PRN Meds: sodium chloride, HYDROmorphone (DILAUDID) injection, morphine injection, oxyCODONE-acetaminophen **AND** oxyCODONE  Allergies:    Allergies  Allergen Reactions  . Diltiazem Hcl Swelling  . Chlorthalidone Other (See Comments)    "Makes me light-headed and I don't like the way it makes me feel"  . Voltaren [Diclofenac] Rash     Social History:   Social History   Socioeconomic History  . Marital status: Married    Spouse name: Not on file  . Number of children: Not on file  . Years of education: Not on file  . Highest education level: Not on file  Occupational History  . Not on file  Tobacco Use  . Smoking status: Never Smoker  . Smokeless tobacco: Never Used  Vaping Use  . Vaping Use: Never used  Substance and Sexual Activity  . Alcohol use: No    Alcohol/week: 0.0 standard drinks  . Drug use: Not Currently  . Sexual activity: Yes  Other Topics Concern  . Not on file  Social  History Narrative  . Not on file   Social Determinants of Health   Financial Resource Strain: Not on file  Food Insecurity: Not on file  Transportation Needs: Not on file  Physical Activity: Not on file  Stress: Not on file  Social Connections: Not on file  Intimate Partner Violence: Not on file    Family History:    Family History  Problem Relation Age of Onset  . Prostate cancer Father   . Prostate cancer Brother   . Colon cancer Neg Hx   . Rectal cancer Neg Hx   . Stomach cancer Neg Hx      ROS:  Please see the history of present illness.  All other ROS reviewed and negative.     Physical Exam/Data:   Vitals:   05/04/20 1816 05/04/20 1931 05/04/20 2027 05/05/20 0815  BP: (!) 160/86 (!) 145/83 (!) 147/75 (!) 145/81  Pulse: 67 70 67 63  Resp: '17 17 17 18  ' Temp: 98.4 F (36.9 C) 98.9 F (37.2 C) (!) 97.5 F (36.4 C) 98.5 F (36.9 C)  TempSrc: Oral Oral Oral Oral  SpO2:  100% 95% 99%  Weight:      Height:        Intake/Output Summary (Last 24 hours) at 05/05/2020 1332 Last data filed at 05/05/2020 1036 Gross per 24 hour  Intake 200 ml  Output 620 ml  Net -420 ml   Last 3 Weights 05/04/2020 04/23/2020 03/31/2020  Weight (lbs) 209 lb 205 lb 193 lb  Weight (kg) 94.802 kg 92.987 kg 87.544 kg     Body mass index is 27.57 kg/m.  General: 70 y.o. male resting comfortably in no acute distress.   HEENT: Normocephalic and atraumatic. Sclera clear.  Neck: Supple. Very soft carotid bruits noted bilaterally. No JVD. Heart: RRR. Distinct S1 and S2. No murmurs, gallops, or rubs. Radial and distal pedal pulses 2+ and equal bilaterally. Lungs: No increased work of breathing. Clear to ausculation bilaterally. No wheezes, rhonchi, or rales.  Abdomen: Soft, non-distended, and non-tender to palpation. Extremities: No lower extremity edema. S/p right 1st toe and 3rd toe amputations. Skin: Warm and dry. Neuro: Alert and oriented x3. No focal deficits. Psych: Normal affect. Responds appropriately.   EKG:  The EKG was personally reviewed and demonstrates: Normal sinus rhythm, rate 65 bpm, with border line 1st degree AV block, LVH with repolarization changes, and non-specific ST/T changes.  Telemetry:  Telemetry was personally reviewed and demonstrates: Not on telemetry.  Relevant CV Studies:  Echocardiogram 02/02/2020: Impression: 1. While there is no visualized LV thrombus on sweeps of the apex,  patient did not have IV access, unable to use echo contrast for  confirmation. Left ventricular ejection fraction, by estimation, is <20%.  The left ventricle has severely decreased  function. The left ventricle demonstrates global hypokinesis. The left  ventricular internal cavity size was moderately dilated. Left ventricular  diastolic function could not be evaluated.  2. Right ventricular systolic function is normal. The right ventricular  size is mildly enlarged. There is mildly elevated pulmonary artery  systolic pressure.  3. Left atrial size was severely dilated.  4. Right atrial size was severely dilated.  5. Moderate pleural effusion in both left and right lateral regions.  6. The mitral valve is normal in structure. Mild mitral valve  regurgitation.  7. The aortic valve is tricuspid. There is mild calcification of the  aortic valve. Aortic valve regurgitation is not visualized.  Mild aortic  valve sclerosis is  present, with no evidence of aortic valve stenosis.  8. The inferior vena cava is dilated in size with <50% respiratory  variability, suggesting right atrial pressure of 15 mmHg.   Conclusion(s)/Recommendation(s): Patient in atrial flutter with RVR during  study. Discussed severely reduced EF with Dr. Gasper Sells, cardiologist  on call.  _______________  Right/Left Cardiac Catheterization 02/09/2020: Conclusions: 1. No angiographically significant coronary artery disease. 2. Upper normal left heart, right heart, and pulmonary artery pressures. 3. Mildly reduced Fick cardiac output/index.  Recommendations: 1. Restart heparin infusion 2 hours after TR band removal.  If there is no evidence of bleeding/vascular complication, consider transitioning back to apixaban as soon as tomorrow. 2. Proceed with TEE/cardioversion as soon as tomorrow.  I will make Mr. Larch NPO after midnight in anticipation of this. 3. Optimize evidence-based heart failure therapy for non-ischemic cardiomyopathy. 4. Primary prevention of coronary artery disease.  Diagnostic Dominance: Right      Laboratory Data:  High Sensitivity Troponin:  No results for input(s): TROPONINIHS in the last 720 hours.   Chemistry Recent Labs  Lab 05/04/20 1120 05/05/20 0218  NA 137 137  K 3.5 3.5  CL 100 99  CO2 26 29  GLUCOSE 110* 92  BUN 16 11  CREATININE 0.65 0.62  CALCIUM 9.1 8.8*  GFRNONAA >60 >60  ANIONGAP 11 9    No results for input(s): PROT, ALBUMIN, AST, ALT, ALKPHOS, BILITOT in the last 168 hours. Hematology Recent Labs  Lab 05/04/20 1120 05/05/20 0218  WBC 8.1 6.3  RBC 4.47 4.34  HGB 12.2* 12.0*  HCT 38.3* 36.5*  MCV 85.7 84.1  MCH 27.3 27.6  MCHC 31.9 32.9  RDW 19.6* 19.9*  PLT 193 184   BNPNo results for input(s): BNP, PROBNP in the last 168 hours.  DDimer No results for input(s): DDIMER in the last 168 hours.   Radiology/Studies:  CT HEAD WO  CONTRAST  Result Date: 05/04/2020 CLINICAL DATA:  Fall yesterday. EXAM: CT HEAD WITHOUT CONTRAST CT CERVICAL SPINE WITHOUT CONTRAST TECHNIQUE: Multidetector CT imaging of the head and cervical spine was performed following the standard protocol without intravenous contrast. Multiplanar CT image reconstructions of the cervical spine were also generated. COMPARISON:  November 18, 2019. FINDINGS: CT HEAD FINDINGS Brain: No evidence of acute infarction, hemorrhage, hydrocephalus, extra-axial collection or mass lesion/mass effect. Vascular: No hyperdense vessel or unexpected calcification. Skull: Normal. Negative for fracture or focal lesion. Sinuses/Orbits: No acute finding. Other: None. CT CERVICAL SPINE FINDINGS Alignment: Mild grade 1 retrolisthesis of C3-4 is noted secondary to severe degenerative disc disease at this level. Skull base and vertebrae: No acute fracture. No primary bone lesion or focal pathologic process. Soft tissues and spinal canal: No prevertebral fluid or swelling. No visible canal hematoma. Disc levels: Severe degenerative disc disease is noted at C3-4. Mild degenerative disc disease is noted at C6-7. Upper chest: Negative. Other: Degenerative changes are seen involving multiple left-sided posterior facet joints. IMPRESSION: 1. Normal head CT. 2. Multilevel degenerative disc disease. No acute abnormality seen in the cervical spine. Electronically Signed   By: Marijo Conception M.D.   On: 05/04/2020 12:46   CT CERVICAL SPINE WO CONTRAST  Result Date: 05/04/2020 CLINICAL DATA:  Fall yesterday. EXAM: CT HEAD WITHOUT CONTRAST CT CERVICAL SPINE WITHOUT CONTRAST TECHNIQUE: Multidetector CT imaging of the head and cervical spine was performed following the standard protocol without intravenous contrast. Multiplanar CT image reconstructions of the cervical spine were also generated. COMPARISON:  November 18, 2019. FINDINGS: CT HEAD FINDINGS  Brain: No evidence of acute infarction, hemorrhage, hydrocephalus,  extra-axial collection or mass lesion/mass effect. Vascular: No hyperdense vessel or unexpected calcification. Skull: Normal. Negative for fracture or focal lesion. Sinuses/Orbits: No acute finding. Other: None. CT CERVICAL SPINE FINDINGS Alignment: Mild grade 1 retrolisthesis of C3-4 is noted secondary to severe degenerative disc disease at this level. Skull base and vertebrae: No acute fracture. No primary bone lesion or focal pathologic process. Soft tissues and spinal canal: No prevertebral fluid or swelling. No visible canal hematoma. Disc levels: Severe degenerative disc disease is noted at C3-4. Mild degenerative disc disease is noted at C6-7. Upper chest: Negative. Other: Degenerative changes are seen involving multiple left-sided posterior facet joints. IMPRESSION: 1. Normal head CT. 2. Multilevel degenerative disc disease. No acute abnormality seen in the cervical spine. Electronically Signed   By: Marijo Conception M.D.   On: 05/04/2020 12:46   DG Hip Unilat With Pelvis 2-3 Views Right  Result Date: 05/04/2020 CLINICAL DATA:  Severe right hip pain status post fall. EXAM: DG HIP (WITH OR WITHOUT PELVIS) 2-3V RIGHT COMPARISON:  01/20/2019 FINDINGS: Mildly displaced right intertrochanteric hip fracture. Mild spurring and joint space loss in the right hip. Radiation seeds noted within the prostate is is. He is IMPRESSION: Mildly displaced right trochanteric hip fracture. Electronically Signed   By: Miachel Roux M.D.   On: 05/04/2020 11:01     Assessment and Plan:   Pre-Op Evaluation - Patient presented after a fall and found to have right hip fracture. - Patient has been doing well from a cardiac standpoint. He has known CHF with EF <20% but has been very stable. No acute CHF symptoms. No angina. - Per Revised Cardiac Risk Index, considered low risk with 0.9% risk of MACE. Patient at acceptable risk for planned procedure without any further cardiac testing. He should have repeat Echo to reassess EF  but I don't think this necessarily needs to be done before surgery.  Would be very judicious with IV fluids given EF.  Chronic Systolic CHF/Non-Ischemic Cardiomyopathy - LVEF <20% on Echo in 01/2020. - Appears euvolemic on exam. - Home Torsemide currently on hold and patient receiving gentle hydration. Would be very careful with this. - Continue Lisinopril 73m daily.  - Patient previously not on beta-blocker due to soft BP. However, BP has been on the higher side here. This may be due to pain but think we could try to add Toprol or Coreg. Can maybe add this tomorrow after surgery. - Will order repeat Echo to be done tomorrow to reassess EF.  New York Heart Association (NYHA) Functional Class NYHA Class I-II  Atrial Flutter with RVR - S/p DCCV in 01/2020.  - Continue Amiodarone 2061mdaily.  - On Eliquis 21m61mwice daily at home. Currently on hold in anticipation of surgery. Would restart as soon as able following procedure.  CHA2DS2-VASc Score = 2  This indicates a 2.2% annual risk of stroke. The patient's score is based upon: CHF History: No HTN History: Yes Diabetes History: No Stroke History: No Vascular Disease History: No Age Score: 1 Gender Score: 0   Hypertension - BP has been mostly elevated for the past 24 hours. Some of this may be due to pain from hip fracture. - Continue Lisinopril 39m40mily. - Consider adding low dose beta-blocker as above.  Otherwise, per primary team: - Right hip fracture - MSSA osteomyelitis of right great toe s/p amputation - Rheumatoid arthritis - Anxiety/depression    For questions or updates,  please contact Uncertain Please consult [email or web address] for contact info under    Signed, Darreld Mclean, PA-C  05/05/2020 1:32 PM   Attending Note:   The patient was seen and examined.  Agree with assessment and plan as noted above.  Changes made to the above note as needed.  Patient seen and independently examined with Sande Rives, PA .   We discussed all aspects of the encounter. I agree with the assessment and plan as stated above.  1.  Pre-op evaluation prior to hip surgery  The patient has known dilated cardiomyopathy.  His ejection fraction was less than 20%.  He had a cardioversion in October, 2021.  Our presumption is that he had a tachycardia mediated cardiomyopathy.  Since that time he is done very well.  He says that his strength has improved.  He has no PND or orthopnea.  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  He carries 30 to 40 pound stones on a routine basis.  At this time I think that he is at low risk for his scheduled hip surgery.  He does not need any additional work-up.  2.  Dilated cardiomyopathy: He has been quite stable.  I would like to get an echocardiogram tomorrow to assess his LV function.  I suspect that his ejection fraction has improved since his cardioversion last October.  We will continue to titrate up his medications.  3.  Atrial fibrillation: Continue amiodarone.  Eliquis is already on hold.  He should be able to restart his Eliquis following his surgery-likely tomorrow morning.   I have spent a total of 40 minutes with patient reviewing hospital  notes , telemetry, EKGs, labs and examining patient as well as establishing an assessment and plan that was discussed with the patient. > 50% of time was spent in direct patient care.    Thayer Headings, Brooke Bonito., MD, [facility] 05/05/2020, 3:31 PM [address],  [place] Pager [phone]

## 2020-05-05 NOTE — Anesthesia Procedure Notes (Addendum)
Procedure Name: Intubation Date/Time: 05/05/2020 6:48 PM Performed by: Griffin Dakin, CRNA Pre-anesthesia Checklist: Patient identified, Emergency Drugs available, Suction available and Patient being monitored Patient Re-evaluated:Patient Re-evaluated prior to induction Oxygen Delivery Method: Circle system utilized Preoxygenation: Pre-oxygenation with 100% oxygen Induction Type: IV induction Ventilation: Mask ventilation without difficulty and Oral airway inserted - appropriate to patient size Laryngoscope Size: Glidescope and 4 Grade View: Grade I Tube type: Oral Tube size: 7.5 mm Number of attempts: 3 Airway Equipment and Method: Stylet and Oral airway Placement Confirmation: ETT inserted through vocal cords under direct vision,  positive ETCO2 and breath sounds checked- equal and bilateral Secured at: 24 cm Tube secured with: Tape Dental Injury: Teeth and Oropharynx as per pre-operative assessment  Comments: Attempt by CRNA and MD with MAC 4 and MIL 3 without success. Grade 1 view achieved with glidescope 4.

## 2020-05-05 NOTE — Transfer of Care (Signed)
Immediate Anesthesia Transfer of Care Note  Patient: Thomas [name]  Procedure(s) Performed: INTRAMEDULLARY (IM) NAIL INTERTROCHANTRIC (Right )  Patient Location: PACU  Anesthesia Type:General  Level of Consciousness: awake, alert  and oriented  Airway & Oxygen Therapy: Patient Spontanous Breathing  Post-op Assessment: Report given to RN and Post -op Vital signs reviewed and stable  Post vital signs: Reviewed and stable  Last Vitals:  Vitals Value Taken Time  BP 164/88 05/05/20 2004  Temp    Pulse 69 05/05/20 2006  Resp 22 05/05/20 2006  SpO2 100 % 05/05/20 2006  Vitals shown include unvalidated device data.  Last Pain:  Vitals:   05/05/20 1443  TempSrc: Oral  PainSc:       Patients Stated Pain Goal: 7 (00/71/21 9758)  Complications: No complications documented.

## 2020-05-05 NOTE — Anesthesia Preprocedure Evaluation (Addendum)
Anesthesia Evaluation  Patient identified by MRN, date of birth, ID band Patient awake    Reviewed: Allergy & Precautions, H&P , NPO status , Patient's Chart, lab work & pertinent test results  Airway Mallampati: II  TM Distance: >3 FB Neck ROM: Full    Dental no notable dental hx. (+) Poor Dentition, Chipped, Missing, Dental Advisory Given   Pulmonary neg pulmonary ROS,    Pulmonary exam normal breath sounds clear to auscultation       Cardiovascular hypertension, Pt. on medications +CHF (LVEF 10-15%, severe RV failure)  negative cardio ROS Normal cardiovascular exam+ dysrhythmias (amio, eliquis) Atrial Fibrillation + Valvular Problems/Murmurs (mild MR, mild-mod TR) MR  Rhythm:Regular Rate:Normal  Echo 01/2020: 1. Severely reduced LV function, LVEF~10-15%. LVOT VTI ~5.5 cm. Left  ventricular ejection fraction, by estimation, is 10-15%. The left  ventricle has severely decreased function. The left ventricle demonstrates  global hypokinesis. The left ventricular  internal cavity size was moderately to severely dilated.  2. Evidence of atrial level shunting detected by color flow Doppler.  Agitated saline contrast bubble study was positive with shunting observed  within 3-6 cardiac cycles suggestive of interatrial shunt. There is a  small patent foramen ovale with  predominantly left to right shunting across the atrial septum.  3. Right ventricular systolic function is severely reduced. The right  ventricular size is severely enlarged.  4. Left atrial size was mild to moderately dilated. No left atrial/left  atrial appendage thrombus was detected. The LAA emptying velocity was 21  cm/s.  5. Right atrial size was mildly dilated.  6. The mitral valve is grossly normal. Mild mitral valve regurgitation.  No evidence of mitral stenosis.  7. Tricuspid valve regurgitation is mild to moderate.  8. The aortic valve is tricuspid.  Aortic valve regurgitation is trivial.  No aortic stenosis is present.   Cath 01/2020: 1. No angiographically significant coronary artery disease. 2. Upper normal left heart, right heart, and pulmonary artery pressures. 3. Mildly reduced Fick cardiac output/index.     Neuro/Psych PSYCHIATRIC DISORDERS Anxiety claustrophobianegative neurological ROS  negative psych ROS   GI/Hepatic negative GI ROS, Neg liver ROS,   Endo/Other  negative endocrine ROS  Renal/GU negative Renal ROS  negative genitourinary   Musculoskeletal negative musculoskeletal ROS (+) Arthritis , Osteoarthritis,  Right hip fx   Abdominal   Peds negative pediatric ROS (+)  Hematology negative hematology ROS (+)   Anesthesia Other Findings   Reproductive/Obstetrics negative OB ROS                            Anesthesia Physical Anesthesia Plan  ASA: IV  Anesthesia Plan: General   Post-op Pain Management:    Induction: Intravenous  PONV Risk Score and Plan: 2 and Ondansetron and Dexamethasone  Airway Management Planned: Oral ETT  Additional Equipment: Arterial line  Intra-op Plan:   Post-operative Plan: Extubation in OR  Informed Consent: I have reviewed the patients History and Physical, chart, labs and discussed the procedure including the risks, benefits and alternatives for the proposed anesthesia with the patient or authorized representative who has indicated his/her understanding and acceptance.       Plan Discussed with: CRNA, Anesthesiologist and Surgeon  Anesthesia Plan Comments: (Very high risk for any type of anesthesia given cardiac hx)     Anesthesia Quick Evaluation

## 2020-05-05 NOTE — Progress Notes (Signed)
PT Cancellation Note  Patient Details Name: ARTRELL LAWLESS MRN: [id number] DOB: 12/30/1950   Cancelled Treatment:    Reason Eval/Treat Not Completed: Active bedrest order Patient currently on bedrest due to planned procedure today (1/12). PT will re-attempt eval following surgery and as time allows.   Martika Egler A. Gilford Rile PT, DPT Acute Rehabilitation Services Pager [phone] Office [phone]    Melene Plan Allred 05/05/2020, 8:07 AM

## 2020-05-05 NOTE — Op Note (Signed)
Orthopaedic Surgery Operative Note (CSN: [id number] ) Date of Surgery: 05/05/2020  Admit Date: 05/04/2020   Diagnoses: Pre-Op Diagnoses: Right intertrochanteric femur fracture   Post-Op Diagnosis: Same  Procedures: CPT 27245-Cephalomedullary nailing of right intertrochanteric femur fracture  Surgeons : Primary: Shona Needles, MD  Assistant: Patrecia Pace, PA-C  Location: OR 7   Anesthesia:General  Antibiotics: Ancef 2g preop with 1 gm vancomycin powder placed topically   Tourniquet time:None  Estimated Blood ACZY:60 mL  Complications:None   Specimens:None  Implants: Implant Name Type Inv. Item Serial No. Manufacturer Lot No. LRB No. Used Action  NAIL TROCH FIX [id number] 130 - [id number] Nail NAIL TROCH FIX [id number] 130  [employer] [id number] Right 1 Implanted     Indications for Surgery: 70 year old male who sustained a ground-level fall on his right leg.  He sustained a right intertrochanteric femur fracture.  Due to the unstable nature of his injury I recommended proceeding with a cephalomedullary nailing of the right femur.  Risks and benefits were discussed with the patient.  This included but not limited to bleeding, infection, malunion, nonunion, hardware failure, hardware irritation, nerve and blood vessel injury, DVT, even the possibility anesthetic complications.  The patient agreed to proceed with surgery and consent was obtained.  Operative Findings: Cephalomedullary nailing of right intertrochanteric femur fracture using Synthes 10 x 170 mm TFNA with a 150 mm lag screw  Procedure: The patient was identified in the preoperative holding area. Consent was confirmed with the patient and their family and all questions were answered. The operative extremity was marked after confirmation with the patient. he was then brought back to the operating room by our anesthesia colleagues.  He was placed under general anesthetic he was carefully positioned on the Hana table.   Fluoroscopic imaging was obtained to the right hip.  Traction was applied and adequate reduction was obtained.  The right lower extremity was then prepped and draped in usual sterile fashion.  A timeout was performed to verify the patient, the procedure, and the extremity.  Preoperative antibiotics were dosed.  A small incision was made proximal to the greater trochanter.  The gluteal fascia was split in line with the incision.  I then used a threaded guidewire to enter the proximal metaphysis at the tip of the greater trochanter.  Once I confirmed positioning of the guidewire then used a entry reamer to enter the medullary canal.  I then passed a 10 x 170 mm Synthes TFNA nail down the center of the canal.  Using the targeting arm I made a percutaneous incision along the lateral thigh and directed a threaded guidewire into the head and neck segment.  I confirmed adequate tip apex distance on AP and lateral views.  I then measured and chose to use 115 mm screw.  I drilled the path for the screw and then placed it without difficulty.  I compressed slightly at the fracture site and tightened the set screw to create a fixed angle construct.  I then used the targeting arm to place a distal interlocking screw.  Final fluoroscopic imaging was obtained.  The incisions were copiously irrigated.  A gram of vancomycin powder was placed into the incisions.  Layered closure of 2-0 Vicryl, 3-0 Monocryl and Dermabond was used to seal the skin.  Sterile dressings were placed.  The patient was then awoken from anesthesia and taken to the PACU in stable condition.  Post Op Plan/Instructions: Patient will be weightbearing as tolerated to the right lower extremity.  He will receive postoperative Ancef.  He will be started on his Eliquis on postoperative day one if his hemoglobin is stable.  We will mobilize him with physical and Occupational Therapy.  I was present and performed the entire surgery.  Patrecia Pace, PA-C did  assist me throughout the case. An assistant was necessary given the difficulty in approach, maintenance of reduction and ability to instrument the fracture.   Katha Hamming, MD [facility]

## 2020-05-05 NOTE — Interval H&P Note (Signed)
History and Physical Interval Note:  05/05/2020 6:12 PM  Thomas [name]  has presented today for surgery, with the diagnosis of Right hip fx.  The various methods of treatment have been discussed with the patient and family. After consideration of risks, benefits and other options for treatment, the patient has consented to  Procedure(s): INTRAMEDULLARY (IM) NAIL INTERTROCHANTRIC (Right) as a surgical intervention.  The patient's history has been reviewed, patient examined, no change in status, stable for surgery.  I have reviewed the patient's chart and labs.  Questions were answered to the patient's satisfaction.     Lennette Bihari P Lorea Kupfer

## 2020-05-05 NOTE — Plan of Care (Signed)

## 2020-05-05 NOTE — Hospital Course (Addendum)
Thomas [name] is a 70 y.o. male presenting with a R intertrochanteric hip fracture. PMH is significant for HFrEF, AFib, RA, HTN, Depression/Anxiety, and multiple toe amputations 2/2 osteomyelitis.    Right Hip Fracture Pt with acute R intertrochanteric hip fracture.  Seen by Orthopedic Surgery in ED and scheduled patient for ED.  Patient underwent successful cephalomedullary nailing of right intertrochanteric femur fracture.  Throughout hospitalization patient's pain was managed with Percocet, Robaxin and Fentanyl.  Remained hemodynamically stable.  He was seen by PT following Surgery and able to be discharged safely with home health PT/OT arranged.  Discharged with 4 days of Percocet 10 mg.    HFrEF, chronic, stable Patient evaluated by Cardiology following Surgery for ECHO.  ECHO showed increase in EF to 45-50%. HR was well controlled.  Patient continued Lisinopril for good BP control.  Torsemide was held and restarted upon D/C.  Osteomyelitis During the hospitalization we opted to have the PICC line removed as the patient only had 1 dose of antibiotics left tomorrow, 05/08/2020 and after speaking with infectious disease pharmacy determined that this was not absolutely necessary.  We offered to remove the PICC line however the patient refused and stated he wanted to leave with the PICC line and that he would take care of having it removed with his home health nurse.  The patient was discharged with his PICC line with the plan to have 1 additional day of antibiotics for his osteomyelitis.

## 2020-05-05 NOTE — Anesthesia Procedure Notes (Signed)
Arterial Line Insertion Start/End1/03/2021 5:45 PM, 05/05/2020 6:00 PM Performed by: Catalina Gravel, MD, Griffin Dakin, CRNA, CRNA  Patient location: Pre-op. Preanesthetic checklist: patient identified, IV checked, site marked, risks and benefits discussed, surgical consent, monitors and equipment checked, pre-op evaluation, timeout performed and anesthesia consent Lidocaine 1% used for infiltration Right, radial was placed Catheter size: 20 G Hand hygiene performed  and maximum sterile barriers used   Attempts: 1 Procedure performed without using ultrasound guided technique. Following insertion, dressing applied and Biopatch. Post procedure assessment: normal and unchanged  Patient tolerated the procedure well with no immediate complications.

## 2020-05-06 ENCOUNTER — Inpatient Hospital Stay (HOSPITAL_COMMUNITY): Payer: Medicare Other

## 2020-05-06 DIAGNOSIS — S72001A Fracture of unspecified part of neck of right femur, initial encounter for closed fracture: Secondary | ICD-10-CM

## 2020-05-06 DIAGNOSIS — I5022 Chronic systolic (congestive) heart failure: Secondary | ICD-10-CM

## 2020-05-06 DIAGNOSIS — I428 Other cardiomyopathies: Secondary | ICD-10-CM

## 2020-05-06 LAB — CBC
HCT: 37.3 % — ABNORMAL LOW (ref 39.0–52.0)
Hemoglobin: 12.3 g/dL — ABNORMAL LOW (ref 13.0–17.0)
MCH: 28 pg (ref 26.0–34.0)
MCHC: 33 g/dL (ref 30.0–36.0)
MCV: 85 fL (ref 80.0–100.0)
Platelets: 198 10*3/uL (ref 150–400)
RBC: 4.39 MIL/uL (ref 4.22–5.81)
RDW: 19.3 % — ABNORMAL HIGH (ref 11.5–15.5)
WBC: 6.3 10*3/uL (ref 4.0–10.5)
nRBC: 0 % (ref 0.0–0.2)

## 2020-05-06 LAB — ECHOCARDIOGRAM COMPLETE
Area-P 1/2: 1.61 cm2
Calc EF: 53.5 %
Height: 73 in
S' Lateral: 3.5 cm
Single Plane A2C EF: 56.2 %
Single Plane A4C EF: 50.3 %
Weight: 3344.01 oz

## 2020-05-06 MED ORDER — ACETAMINOPHEN 325 MG PO TABS: 325.0000 mg | ORAL_TABLET | Freq: Four times a day (QID) | ORAL | Status: DC | PRN

## 2020-05-06 MED ORDER — APIXABAN 5 MG PO TABS
5.0000 mg | ORAL_TABLET | Freq: Two times a day (BID) | ORAL | Status: DC
  Administered 2020-05-06 – 2020-05-07 (×3): 5 mg via ORAL
  Filled 2020-05-06 (×3): qty 1

## 2020-05-06 MED ORDER — VITAMIN D 25 MCG (1000 UNIT) PO TABS
1000.0000 [IU] | ORAL_TABLET | Freq: Every day | ORAL | Status: DC
  Administered 2020-05-06 – 2020-05-07 (×2): 1000 [IU] via ORAL
  Filled 2020-05-06 (×2): qty 1

## 2020-05-06 NOTE — Evaluation (Signed)
Physical Therapy Evaluation Patient Details Name: Thomas [name] MRN: [id number] DOB: 1950/05/30 Today's Date: 05/06/2020   History of Present Illness  70 y.o. male presented 05/04/20 with a R intertrochanteric hip fracture s/p fall. PMH is significant for HFrEF, AFib, RA, HTN, Depression/Anxiety, and multiple toe amputations 2/2 osteomyelitis. 05/05/20 ORIF of right intertrochanteric femur fracture  Clinical Impression   Patient is s/p above surgery resulting in functional limitations due to the deficits listed below (see PT Problem List). Patient with generalized weakness prior to fall, however was independent with all activities. He fell on his steps trying to carry supplies (2 boxes) up the outside steps and lost his balance. He was unrealistic about his ability to ambulate "down the hall" today, and only ambulated 8 ft due to pain (despite premedication for pain).  Patient will benefit from skilled PT to increase their independence and safety with mobility to allow discharge to the venue listed below.       Follow Up Recommendations Home health PT;Supervision/Assistance - 24 hour    Equipment Recommendations  None recommended by PT    Recommendations for Other Services       Precautions / Restrictions Precautions Precautions: Fall Precaution Comments: fell carrying objects up steps Restrictions RLE Weight Bearing: Weight bearing as tolerated      Mobility  Bed Mobility Overal bed mobility: Needs Assistance Bed Mobility: Supine to Sit     Supine to sit: Mod assist     General bed mobility comments: assisting RLE over EOB and lowering to the floor; mod assist with HOB flat    Transfers Overall transfer level: Needs assistance Equipment used: Rolling walker (2 wheeled) Transfers: Sit to/from Stand Sit to Stand: Min assist;+2 physical assistance;+2 safety/equipment;From elevated surface (bed elevated to simulate bed at home)         General transfer comment: pt is  6'8"--bed elevated to simulate home; vc for technique and safe use of RW  Ambulation/Gait Ambulation/Gait assistance: Min assist;+2 safety/equipment (chair follow) Gait Distance (Feet): 8 Feet Assistive device: Rolling walker (2 wheeled) Gait Pattern/deviations: Step-to pattern;Trunk flexed Gait velocity: very slow due to pain and difficulty advancing legs   General Gait Details: very flexed torso (could improve with cues); max cues for sequencing (with each step)  Stairs            Wheelchair Mobility    Modified Rankin (Stroke Patients Only)       Balance Overall balance assessment: Needs assistance;History of Falls Sitting-balance support: No upper extremity supported;Feet supported Sitting balance-Leahy Scale: Fair     Standing balance support: Bilateral upper extremity supported Standing balance-Leahy Scale: Poor                               Pertinent Vitals/Pain Pain Assessment: 0-10 Pain Score: 8  Pain Location: rt hip Pain Descriptors / Indicators: Aching Pain Intervention(s): Limited activity within patient's tolerance;Premedicated before session    Home Living Family/patient expects to be discharged to:: Private residence Living Arrangements: Spouse/significant other;Children Available Help at Discharge: Family;Available 24 hours/day Type of Home: House Home Access: Level entry (back entrance is level)     Home Layout: One level Home Equipment: Walker - 2 wheels;Bedside commode;Grab bars - tub/shower;Cane - single point;Grab bars - toilet Additional Comments: wife is visually impaired, 16 yo son lives at home and can assist as needed    Prior Function Level of Independence: Independent with assistive device(s)  Comments: uses grab bars in shower     Hand Dominance   Dominant Hand: Right    Extremity/Trunk Assessment   Upper Extremity Assessment Upper Extremity Assessment: Defer to OT evaluation    Lower Extremity  Assessment Lower Extremity Assessment: RLE deficits/detail;Generalized weakness RLE Deficits / Details: passively bending to reach socks    Cervical / Trunk Assessment Cervical / Trunk Assessment: Kyphotic  Communication   Communication: No difficulties  Cognition Arousal/Alertness: Awake/alert Behavior During Therapy: WFL for tasks assessed/performed Overall Cognitive Status: Impaired/Different from baseline Area of Impairment: Attention;Safety/judgement;Awareness;Problem solving                   Current Attention Level: Selective     Safety/Judgement: Decreased awareness of safety;Decreased awareness of deficits Awareness: Intellectual Problem Solving: Difficulty sequencing;Requires verbal cues;Requires tactile cues General Comments: very talkative; thought he could do more than he was able to do (I'm ready to walk down the hall... That's not happening today.)      General Comments General comments (skin integrity, edema, etc.): Patient eager to participate, however quickly learned his expectations for what he would accomplish today were lofty.    Exercises General Exercises - Lower Extremity Ankle Circles/Pumps: AROM;5 reps;Both Long Arc Quad: Other (comment) (educated) Heel Slides: Other (comment) (educated for when back in bed)   Assessment/Plan    PT Assessment Patient needs continued PT services  PT Problem List Decreased strength;Decreased range of motion;Decreased activity tolerance;Decreased balance;Decreased mobility;Decreased cognition;Decreased knowledge of use of DME;Decreased safety awareness;Pain       PT Treatment Interventions DME instruction;Gait training;Functional mobility training;Therapeutic activities;Therapeutic exercise;Cognitive remediation;Patient/family education    PT Goals (Current goals can be found in the Care Plan section)  Acute Rehab PT Goals Patient Stated Goal: go home ASAP PT Goal Formulation: With patient Time For Goal  Achievement: 05/20/20 Potential to Achieve Goals: Good    Frequency Min 5X/week   Barriers to discharge        Co-evaluation PT/OT/SLP Co-Evaluation/Treatment: Yes Reason for Co-Treatment: Necessary to address cognition/behavior during functional activity;For patient/therapist safety;To address functional/ADL transfers PT goals addressed during session: Mobility/safety with mobility;Proper use of DME;Strengthening/ROM         AM-PAC PT "6 Clicks" Mobility  Outcome Measure Help needed turning from your back to your side while in a flat bed without using bedrails?: A Little Help needed moving from lying on your back to sitting on the side of a flat bed without using bedrails?: A Lot Help needed moving to and from a bed to a chair (including a wheelchair)?: A Lot Help needed standing up from a chair using your arms (e.g., wheelchair or bedside chair)?: A Lot Help needed to walk in hospital room?: A Lot Help needed climbing 3-5 steps with a railing? : Total 6 Click Score: 12    End of Session Equipment Utilized During Treatment: Gait belt Activity Tolerance: Patient limited by pain Patient left: in chair;with call bell/phone within reach;with chair alarm set   PT Visit Diagnosis: Other abnormalities of gait and mobility (R26.89);History of falling (Z91.81);Pain Pain - Right/Left: Right Pain - part of body: Hip    Time: 1100-1139 PT Time Calculation (min) (ACUTE ONLY): 39 min   Charges:   PT Evaluation $PT Eval Low Complexity: 1 Low           Arby Barrette, PT Pager [phone]   Rexanne Mano 05/06/2020, 11:54 AM

## 2020-05-06 NOTE — Anesthesia Postprocedure Evaluation (Signed)
Anesthesia Post Note  Patient: TASEAN MANCHA  Procedure(s) Performed: CPT 27245-Cephalomedullary nailing of right intertrochanteric femur fracture (Right Leg Upper)     Patient location during evaluation: PACU Anesthesia Type: General Level of consciousness: awake and alert Pain management: pain level controlled Vital Signs Assessment: post-procedure vital signs reviewed and stable Respiratory status: spontaneous breathing, nonlabored ventilation, respiratory function stable and patient connected to nasal cannula oxygen Cardiovascular status: blood pressure returned to baseline and stable Postop Assessment: no apparent nausea or vomiting Anesthetic complications: no   No complications documented.  Last Vitals:  Vitals:   05/05/20 2145 05/05/20 2300  BP: (!) 157/85 (!) 147/79  Pulse: 65 78  Resp: 16 18  Temp: 36.6 C 36.5 C  SpO2: 95% 92%    Last Pain:  Vitals:   05/05/20 2341  TempSrc:   PainSc: Asleep                 Catalina Gravel

## 2020-05-06 NOTE — Progress Notes (Signed)
[place] for apixaban Indication: atrial fibrillation, fracture care post-operative   Allergies  Allergen Reactions  . Diltiazem Hcl Swelling  . Chlorthalidone Other (See Comments)    "Makes me light-headed and I don't like the way it makes me feel"  . Voltaren [Diclofenac] Rash    Patient Measurements: Height: 6\' 1"  (185.4 cm) Weight: 94.8 kg (209 lb) IBW/kg (Calculated) : 79.9 Heparin Dosing Weight: 94.8 kg  Vital Signs: Temp: 97.5 F (36.4 C) (01/13 0330) Temp Source: Oral (01/13 0330) BP: 130/76 (01/13 0330) Pulse Rate: 61 (01/13 0330)  Labs: Recent Labs    05/04/20 1120 05/05/20 0218 05/06/20 0231  HGB 12.2* 12.0* 12.3*  HCT 38.3* 36.5* 37.3*  PLT 193 184 198  LABPROT 14.1  --   --   INR 1.1  --   --   CREATININE 0.65 0.62  --     Estimated Creatinine Clearance: 101.3 mL/min (by C-G formula based on SCr of 0.62 mg/dL).   Medical History: Past Medical History:  Diagnosis Date  . Atrial flutter ([place]) 01/2020  . Cancer [facility])    prostate  . Claustrophobia    quite severe  . Heart failure with reduced ejection fraction ([place])   . Hypertension   . Hypoglycemia    occ  . Left knee DJD    Xray 12/23/08  . NICM (nonischemic cardiomyopathy) ([place]) 02/12/2020  . Prostate cancer ([place])     Medications:  Scheduled:  . (feeding supplement) PROSource Plus  30 mL Oral BID BM  . amiodarone  200 mg Oral Daily  . apixaban  5 mg Oral BID  . atorvastatin  40 mg Oral q1800  . Chlorhexidine Gluconate Cloth  6 each Topical Daily  . diazepam  5 mg Oral QHS  . DULoxetine  60 mg Oral Daily  . feeding supplement (PEDIASURE PEPTIDE 1.0 CAL)  237 mL Oral BID BM  . fentaNYL      . gabapentin  300 mg Oral TID  . influenza vaccine adjuvanted  0.5 mL Intramuscular Tomorrow-1000  . lisinopril  10 mg Oral Daily  . melatonin  10 mg Oral QHS  . predniSONE  10 mg Oral Q breakfast  . senna  1 tablet Oral BID    Assessment: 61 yom who had  a ground level fall on R leg now s/p nailing of R intertrochanteric femur fx. On apixaban PTA for Afib (LD 1/10).  Consult for pharmacy to restart PTA DOAC on POD1 if no s/sx of bleeding, no transfusion requirements, and Hgb >/= 9.5 tomorrow. Patient's Hgb 12.3, no transfusion requirements, and no s/sx of bleeding.   Goal of Therapy:  Monitor platelets by anticoagulation protocol: Yes   Plan:  Resume Eliquis 5 mg twice daily  Monitor CBC and s&s bleeding  Romilda Garret, PharmD PGY1 Acute Care Pharmacy Resident 05/06/2020 7:11 AM  Please check [email or web address] for unit specific pharmacy phone numbers.

## 2020-05-06 NOTE — Progress Notes (Addendum)
Progress Note  Patient Name: Thomas [name] Date of Encounter: 05/06/2020  [facility] Cardiologist: Freada Bergeron, MD   Subjective   Surgery completed yesterday afternoon. Pt feels well. No cardiac complaints. Echo in progress.  Inpatient Medications    Scheduled Meds: . (feeding supplement) PROSource Plus  30 mL Oral BID BM  . amiodarone  200 mg Oral Daily  . apixaban  5 mg Oral BID  . atorvastatin  40 mg Oral q1800  . Chlorhexidine Gluconate Cloth  6 each Topical Daily  . cholecalciferol  1,000 Units Oral Daily  . diazepam  5 mg Oral QHS  . DULoxetine  60 mg Oral Daily  . feeding supplement (PEDIASURE PEPTIDE 1.0 CAL)  237 mL Oral BID BM  . gabapentin  300 mg Oral TID  . influenza vaccine adjuvanted  0.5 mL Intramuscular Tomorrow-1000  . lisinopril  10 mg Oral Daily  . melatonin  10 mg Oral QHS  . predniSONE  10 mg Oral Q breakfast  . senna  1 tablet Oral BID   Continuous Infusions: . sodium chloride    .  ceFAZolin (ANCEF) IV 2 g (05/06/20 0430)   PRN Meds: sodium chloride, acetaminophen, HYDROmorphone (DILAUDID) injection, methocarbamol, oxyCODONE-acetaminophen **AND** oxyCODONE   Vital Signs    Vitals:   05/05/20 2145 05/05/20 2300 05/06/20 0330 05/06/20 0810  BP: (!) 157/85 (!) 147/79 130/76 124/72  Pulse: 65 78 61 (!) 59  Resp: 16 18 16 17   Temp: 97.9 F (36.6 C) 97.7 F (36.5 C) (!) 97.5 F (36.4 C) 97.8 F (36.6 C)  TempSrc: Oral Oral Oral Oral  SpO2: 95% 92% 97% 94%  Weight:      Height:        Intake/Output Summary (Last 24 hours) at 05/06/2020 1014 Last data filed at 05/06/2020 0500 Gross per 24 hour  Intake 1148.22 ml  Output 875 ml  Net 273.22 ml   Last 3 Weights 05/05/2020 05/04/2020 04/23/2020  Weight (lbs) 209 lb 209 lb 205 lb  Weight (kg) 94.802 kg 94.802 kg 92.987 kg      Telemetry    NA - Personally Reviewed  ECG    No new tracings - Personally Reviewed  Physical Exam   GEN: No acute distress.   Neck: No  JVD Cardiac: RRR, no murmurs, rubs, or gallops.  Respiratory: Clear to auscultation bilaterally. GI: Soft, nontender, non-distended  MS: No edema; No deformity. Neuro:  Nonfocal  Psych: Normal affect  Right hip with dressing  Labs    High Sensitivity Troponin:  No results for input(s): TROPONINIHS in the last 720 hours.    Chemistry Recent Labs  Lab 05/04/20 1120 05/05/20 0218  NA 137 137  K 3.5 3.5  CL 100 99  CO2 26 29  GLUCOSE 110* 92  BUN 16 11  CREATININE 0.65 0.62  CALCIUM 9.1 8.8*  GFRNONAA >60 >60  ANIONGAP 11 9     Hematology Recent Labs  Lab 05/04/20 1120 05/05/20 0218 05/06/20 0231  WBC 8.1 6.3 6.3  RBC 4.47 4.34 4.39  HGB 12.2* 12.0* 12.3*  HCT 38.3* 36.5* 37.3*  MCV 85.7 84.1 85.0  MCH 27.3 27.6 28.0  MCHC 31.9 32.9 33.0  RDW 19.6* 19.9* 19.3*  PLT 193 184 198    BNPNo results for input(s): BNP, PROBNP in the last 168 hours.   DDimer No results for input(s): DDIMER in the last 168 hours.   Radiology    CT HEAD WO CONTRAST  Result Date:  05/04/2020 CLINICAL DATA:  Fall yesterday. EXAM: CT HEAD WITHOUT CONTRAST CT CERVICAL SPINE WITHOUT CONTRAST TECHNIQUE: Multidetector CT imaging of the head and cervical spine was performed following the standard protocol without intravenous contrast. Multiplanar CT image reconstructions of the cervical spine were also generated. COMPARISON:  November 18, 2019. FINDINGS: CT HEAD FINDINGS Brain: No evidence of acute infarction, hemorrhage, hydrocephalus, extra-axial collection or mass lesion/mass effect. Vascular: No hyperdense vessel or unexpected calcification. Skull: Normal. Negative for fracture or focal lesion. Sinuses/Orbits: No acute finding. Other: None. CT CERVICAL SPINE FINDINGS Alignment: Mild grade 1 retrolisthesis of C3-4 is noted secondary to severe degenerative disc disease at this level. Skull base and vertebrae: No acute fracture. No primary bone lesion or focal pathologic process. Soft tissues and spinal  canal: No prevertebral fluid or swelling. No visible canal hematoma. Disc levels: Severe degenerative disc disease is noted at C3-4. Mild degenerative disc disease is noted at C6-7. Upper chest: Negative. Other: Degenerative changes are seen involving multiple left-sided posterior facet joints. IMPRESSION: 1. Normal head CT. 2. Multilevel degenerative disc disease. No acute abnormality seen in the cervical spine. Electronically Signed   By: Marijo Conception M.D.   On: 05/04/2020 12:46   CT CERVICAL SPINE WO CONTRAST  Result Date: 05/04/2020 CLINICAL DATA:  Fall yesterday. EXAM: CT HEAD WITHOUT CONTRAST CT CERVICAL SPINE WITHOUT CONTRAST TECHNIQUE: Multidetector CT imaging of the head and cervical spine was performed following the standard protocol without intravenous contrast. Multiplanar CT image reconstructions of the cervical spine were also generated. COMPARISON:  November 18, 2019. FINDINGS: CT HEAD FINDINGS Brain: No evidence of acute infarction, hemorrhage, hydrocephalus, extra-axial collection or mass lesion/mass effect. Vascular: No hyperdense vessel or unexpected calcification. Skull: Normal. Negative for fracture or focal lesion. Sinuses/Orbits: No acute finding. Other: None. CT CERVICAL SPINE FINDINGS Alignment: Mild grade 1 retrolisthesis of C3-4 is noted secondary to severe degenerative disc disease at this level. Skull base and vertebrae: No acute fracture. No primary bone lesion or focal pathologic process. Soft tissues and spinal canal: No prevertebral fluid or swelling. No visible canal hematoma. Disc levels: Severe degenerative disc disease is noted at C3-4. Mild degenerative disc disease is noted at C6-7. Upper chest: Negative. Other: Degenerative changes are seen involving multiple left-sided posterior facet joints. IMPRESSION: 1. Normal head CT. 2. Multilevel degenerative disc disease. No acute abnormality seen in the cervical spine. Electronically Signed   By: Marijo Conception M.D.   On:  05/04/2020 12:46   DG C-Arm 1-60 Min  Result Date: 05/05/2020 CLINICAL DATA:  Right hip fracture EXAM: OPERATIVE RIGHT HIP (WITH PELVIS IF PERFORMED) 4 VIEWS TECHNIQUE: Fluoroscopic spot image(s) were submitted for interpretation post-operatively. COMPARISON:  05/04/2020 FINDINGS: Internal fixation across the right femoral intertrochanteric fracture. Anatomic alignment. No hardware complicating feature. IMPRESSION: Internal fixation.  No visible complicating feature. Electronically Signed   By: Rolm Baptise M.D.   On: 05/05/2020 19:38   DG HIP PORT UNILAT W OR W/O PELVIS 1V RIGHT  Result Date: 05/05/2020 CLINICAL DATA:  Status post fracture. EXAM: DG HIP (WITH OR WITHOUT PELVIS) 1V PORT RIGHT COMPARISON:  May 05, 2020 FINDINGS: The patient has undergone intramedullary nail placement through the right femur. The hardware is intact. The osseous alignment is improved. There are degenerative changes of both hips and the lumbar spine. Multiple calcifications project over the patient's pelvis and are favored to represent phleboliths. IMPRESSION: Status post intramedullary nail placement through the right femur. The osseous alignment is improved. Electronically Signed  By: Constance Holster M.D.   On: 05/05/2020 22:40   DG HIP OPERATIVE UNILAT W OR W/O PELVIS RIGHT  Result Date: 05/05/2020 CLINICAL DATA:  Right hip fracture EXAM: OPERATIVE RIGHT HIP (WITH PELVIS IF PERFORMED) 4 VIEWS TECHNIQUE: Fluoroscopic spot image(s) were submitted for interpretation post-operatively. COMPARISON:  05/04/2020 FINDINGS: Internal fixation across the right femoral intertrochanteric fracture. Anatomic alignment. No hardware complicating feature. IMPRESSION: Internal fixation.  No visible complicating feature. Electronically Signed   By: Rolm Baptise M.D.   On: 05/05/2020 19:38   DG Hip Unilat With Pelvis 2-3 Views Right  Result Date: 05/04/2020 CLINICAL DATA:  Severe right hip pain status post fall. EXAM: DG HIP (WITH  OR WITHOUT PELVIS) 2-3V RIGHT COMPARISON:  01/20/2019 FINDINGS: Mildly displaced right intertrochanteric hip fracture. Mild spurring and joint space loss in the right hip. Radiation seeds noted within the prostate is is. He is IMPRESSION: Mildly displaced right trochanteric hip fracture. Electronically Signed   By: Miachel Roux M.D.   On: 05/04/2020 11:01    Cardiac Studies   Echo pending   Echocardiogram 02/02/2020: Impression: 1. While there is no visualized LV thrombus on sweeps of the apex,  patient did not have IV access, unable to use echo contrast for  confirmation. Left ventricular ejection fraction, by estimation, is <20%.  The left ventricle has severely decreased  function. The left ventricle demonstrates global hypokinesis. The left  ventricular internal cavity size was moderately dilated. Left ventricular  diastolic function could not be evaluated.  2. Right ventricular systolic function is normal. The right ventricular  size is mildly enlarged. There is mildly elevated pulmonary artery  systolic pressure.  3. Left atrial size was severely dilated.  4. Right atrial size was severely dilated.  5. Moderate pleural effusion in both left and right lateral regions.  6. The mitral valve is normal in structure. Mild mitral valve  regurgitation.  7. The aortic valve is tricuspid. There is mild calcification of the  aortic valve. Aortic valve regurgitation is not visualized. Mild aortic  valve sclerosis is present, with no evidence of aortic valve stenosis.  8. The inferior vena cava is dilated in size with <50% respiratory  variability, suggesting right atrial pressure of 15 mmHg.   Conclusion(s)/Recommendation(s): Patient in atrial flutter with RVR during  study. Discussed severely reduced EF with Dr. Gasper Sells, cardiologist  on call.   Patient Profile     70 y.o. male  with a history of chronic systolic CHF/non-ischemic cardiomyopathy with EF of <20%, atrial  flutter s/p DCCV in 01/2020 on Amiodarone and Eliquis, hypertension, recent osteomyelitis s/p multiple toe amputations, rheumatoid arthritis, prostate cancer, and anxiety/depression  who is being seen today for pre-op evaluation for acute right intertrochanteric hip fractures   Assessment & Plan    NICM Chronic systolic heart failure - echo being performed during exam - prior echo with EF < 20% felt to be related to RVR - torsemide on hold - does not appear volume up - may need to titrate torsemide depending on echo results   Atrial flutter RVR S/P DCCV 01/2020 - not on telemetry - RRR on exam - eliquis 5 mg BID has been restarted This patients CHA2DS2-VASc Score and unadjusted Ischemic Stroke Rate (% per year) is equal to 3.2 % stroke rate/year from a score of 3 (HTN, CHF, age) - continue amiodarone 200 mg daily   Hypertension - follow BP after surgery - on ACEI - HR 50-60s - hold off on BB  For questions or updates, please contact [place] Please consult [email or web address] for contact info under        Signed, Ledora Bottcher, PA  05/06/2020, 10:14 AM    Attending Note:   The patient was seen and examined.  Agree with assessment and plan as noted above.  Changes made to the above note as needed.  Patient seen and independently examined with  Doreene Adas, PA .   We discussed all aspects of the encounter. I agree with the assessment and plan as stated above.  1.   Atrial fib:   Has remained in NSR - on on amio.  Back on eliquis now  2.  Chronic systolic CHF:   EF has improved some by echo .  Full report to follow  Continue Lisinopril 10 mg a day  HR is slow,  Will not start a beta blocker at this point  Will follow up with Dr. Johney Frame and with the AF clinic  Cardiology will sign off Call for questions    I have spent a total of 40 minutes with patient reviewing hospital  notes , telemetry, EKGs, labs and examining patient as well as establishing an  assessment and plan that was discussed with the patient. > 50% of time was spent in direct patient care.    Thayer Headings, Brooke Bonito., MD, [facility] 05/06/2020, 11:22 AM [address],  [place] Pager [phone]

## 2020-05-06 NOTE — Evaluation (Signed)
Occupational Therapy Evaluation Patient Details Name: Thomas [name] MRN: [id number] DOB: 1950-08-20 Today's Date: 05/06/2020    History of Present Illness 70 y.o. male presented 05/04/20 with a R intertrochanteric hip fracture s/p fall. PMH is significant for HFrEF, AFib, RA, HTN, Depression/Anxiety, and multiple toe amputations 2/2 osteomyelitis. 05/05/20 ORIF of right intertrochanteric femur fracture   Clinical Impression   Pt presents with decline in function and safety with ADLs and ADL mobility with impaired strength, balance, endurance and safety awareness. Pt with functional deficits listed below and would benefit from acute OT services to address impairments to maximize level of function and safety    Follow Up Recommendations  Home health OT    Equipment Recommendations  3 in 1 bedside commode;Tub/shower bench;Other (comment) (ADL A/E kit)    Recommendations for Other Services       Precautions / Restrictions Precautions Precautions: Fall Precaution Comments: fell carrying objects up steps Restrictions Weight Bearing Restrictions: Yes RLE Weight Bearing: Weight bearing as tolerated      Mobility Bed Mobility Overal bed mobility: Needs Assistance Bed Mobility: Supine to Sit     Supine to sit: Mod assist     General bed mobility comments: assisting RLE over EOB and lowering to the floor; mod assist with HOB flat    Transfers Overall transfer level: Needs assistance Equipment used: Rolling walker (2 wheeled) Transfers: Sit to/from Stand Sit to Stand: Min assist;+2 physical assistance;+2 safety/equipment;From elevated surface         General transfer comment: pt is 6'8"--bed elevated to simulate home; vc for technique and safe use of RW    Balance Overall balance assessment: Needs assistance;History of Falls Sitting-balance support: No upper extremity supported;Feet supported Sitting balance-Leahy Scale: Fair     Standing balance support: Bilateral  upper extremity supported Standing balance-Leahy Scale: Poor                             ADL either performed or assessed with clinical judgement   ADL Overall ADL's : Needs assistance/impaired Eating/Feeding: Independent;Sitting   Grooming: Wash/dry hands;Wash/dry face;Min guard;Standing   Upper Body Bathing: Set up;Supervision/ safety;Sitting   Lower Body Bathing: Maximal assistance   Upper Body Dressing : Set up;Supervision/safety;Sitting   Lower Body Dressing: Maximal assistance   Toilet Transfer: Minimal assistance;+2 for safety/equipment;+2 for physical assistance;Ambulation;RW;Cueing for safety   Toileting- Clothing Manipulation and Hygiene: Moderate assistance;Sit to/from stand       Functional mobility during ADLs: Minimal assistance;+2 for physical assistance;+2 for safety/equipment;Rolling walker;Cueing for safety General ADL Comments: pt educated on ADL A/E for LB selfcare with use of pictures and video     Vision Patient Visual Report: No change from baseline       Perception     Praxis      Pertinent Vitals/Pain Pain Assessment: 0-10 Pain Score: 8  Pain Location: rt hip Pain Descriptors / Indicators: Aching Pain Intervention(s): Limited activity within patient's tolerance;Premedicated before session;Monitored during session;Repositioned     Hand Dominance Right   Extremity/Trunk Assessment Upper Extremity Assessment Upper Extremity Assessment: Overall WFL for tasks assessed   Lower Extremity Assessment Lower Extremity Assessment: Defer to PT evaluation RLE Deficits / Details: passively bending to reach socks   Cervical / Trunk Assessment Cervical / Trunk Assessment: Kyphotic   Communication Communication Communication: No difficulties   Cognition Arousal/Alertness: Awake/alert Behavior During Therapy: WFL for tasks assessed/performed Overall Cognitive Status: Impaired/Different from baseline Area of Impairment:  Attention;Safety/judgement;Awareness;Problem  solving                   Current Attention Level: Selective     Safety/Judgement: Decreased awareness of safety;Decreased awareness of deficits Awareness: Intellectual Problem Solving: Difficulty sequencing;Requires verbal cues;Requires tactile cues General Comments: very talkative; thought he could do more than he was able to do (I'm ready to walk down the hall... That's not happening today.)   General Comments  Patient eager to participate, however quickly learned his expectations for what he would accomplish today were lofty.    Exercises Exercises: General Lower Extremity General Exercises - Lower Extremity Ankle Circles/Pumps: AROM;5 reps;Both Long Arc Quad: Other (comment) (educated) Heel Slides: Other (comment) (educated for when back in bed)   Shoulder Instructions      Home Living Family/patient expects to be discharged to:: Private residence Living Arrangements: Spouse/significant other;Children Available Help at Discharge: Family;Available 24 hours/day Type of Home: House Home Access: Level entry     Home Layout: One level     Bathroom Shower/Tub: Teacher, early years/pre: Standard     Home Equipment: Environmental consultant - 2 wheels;Bedside commode;Grab bars - tub/shower;Cane - single point;Grab bars - toilet   Additional Comments: wife is visually impaired, 20 yo son lives at home and can assist as needed      Prior Functioning/Environment Level of Independence: Independent with assistive device(s)        Comments: uses grab bars in shower        OT Problem List: Impaired balance (sitting and/or standing);Decreased safety awareness;Decreased activity tolerance;Decreased coordination;Decreased knowledge of use of DME or AE;Pain      OT Treatment/Interventions: Self-care/ADL training;Therapeutic exercise;Balance training;Patient/family education;DME and/or AE instruction;Therapeutic activities    OT  Goals(Current goals can be found in the care plan section) Acute Rehab OT Goals Patient Stated Goal: go home ASAP OT Goal Formulation: With patient Time For Goal Achievement: 05/20/20 Potential to Achieve Goals: Good ADL Goals Pt Will Perform Grooming: with supervision;with set-up;standing Pt Will Perform Upper Body Bathing: with set-up;sitting Pt Will Perform Lower Body Bathing: with mod assist;with min assist;with adaptive equipment;sitting/lateral leans;sit to/from stand Pt Will Perform Upper Body Dressing: with set-up;sitting Pt Will Perform Lower Body Dressing: with mod assist;with min assist;sitting/lateral leans;sit to/from stand;with adaptive equipment Pt Will Transfer to Toilet: with min guard assist;ambulating Pt Will Perform Toileting - Clothing Manipulation and hygiene: with min assist;with min guard assist;sit to/from stand Pt Will Perform Tub/Shower Transfer: with min assist;with min guard assist;rolling walker;ambulating;tub bench;3 in 1  OT Frequency: Min 2X/week   Barriers to D/C:            Co-evaluation PT/OT/SLP Co-Evaluation/Treatment: Yes Reason for Co-Treatment: Necessary to address cognition/behavior during functional activity;For patient/therapist safety;To address functional/ADL transfers PT goals addressed during session: Mobility/safety with mobility;Proper use of DME;Strengthening/ROM OT goals addressed during session: ADL's and self-care;Proper use of Adaptive equipment and DME      AM-PAC OT "6 Clicks" Daily Activity     Outcome Measure Help from another person eating meals?: None Help from another person taking care of personal grooming?: A Little Help from another person toileting, which includes using toliet, bedpan, or urinal?: A Lot Help from another person bathing (including washing, rinsing, drying)?: A Lot Help from another person to put on and taking off regular upper body clothing?: A Little Help from another person to put on and taking off  regular lower body clothing?: A Lot 6 Click Score: 16   End of Session Equipment  Utilized During Treatment: Gait belt;Rolling walker  Activity Tolerance: Patient tolerated treatment well Patient left: in chair;with call bell/phone within reach;with chair alarm set  OT Visit Diagnosis: Unsteadiness on feet (R26.81);Other abnormalities of gait and mobility (R26.89);History of falling (Z91.81);Pain;Other symptoms and signs involving cognitive function Pain - part of body: Hip;Leg                Time: 7356-7014 OT Time Calculation (min): 32 min Charges:  OT General Charges $OT Visit: 1 Visit OT Evaluation $OT Eval Low Complexity: 1 Low OT Treatments $Self Care/Home Management : 8-22 mins    Britt Bottom 05/06/2020, 12:58 PM

## 2020-05-06 NOTE — Progress Notes (Signed)
  Echocardiogram 2D Echocardiogram has been performed.  Fidel Levy 05/06/2020, 10:31 AM

## 2020-05-06 NOTE — Progress Notes (Addendum)
Orthopaedic Trauma Progress Note  SUBJECTIVE: Reports mild pain about operative site. No chest pain. No SOB. No nausea/vomiting. No other complaints.  I have discontinued patient's IV morphine as he would like to stick with oral medications. Very eager to work with therapies this morning, asking what time they will be coming.  He is very eager to get back home to his wife and son with potential for winter storm this weekend.  [place] and has to instruct his son on how to get things ready at the house in case there is a big snow.  Patient does note he has no stairs to enter his house.  He has several safety bars around the house and in the shower as he has had surgeries in the past.  Also has a walker at home.  OBJECTIVE:  Vitals:   05/06/20 0330 05/06/20 0810  BP: 130/76 124/72  Pulse: 61 (!) 59  Resp: 16 17  Temp: (!) 97.5 F (36.4 C) 97.8 F (36.6 C)  SpO2: 97% 94%    General: Sitting up in bed, no acute distress Respiratory: No increased work of breathing.  Right lower extremity: Dressings clean, dry, intact.  No significant tenderness with palpation of the hip or throughout the thigh.  Tolerates gentle knee motion.  Sensation intact distally.  Skin warm and dry. + DP pulse  IMAGING: Stable post op imaging.   LABS:  Results for orders placed or performed during the hospital encounter of 05/04/20 (from the past 24 hour(s))  CBC     Status: Abnormal   Collection Time: 05/06/20  2:31 AM  Result Value Ref Range   WBC 6.3 4.0 - 10.5 K/uL   RBC 4.39 4.22 - 5.81 MIL/uL   Hemoglobin 12.3 (L) 13.0 - 17.0 g/dL   HCT 37.3 (L) 39.0 - 52.0 %   MCV 85.0 80.0 - 100.0 fL   MCH 28.0 26.0 - 34.0 pg   MCHC 33.0 30.0 - 36.0 g/dL   RDW 19.3 (H) 11.5 - 15.5 %   Platelets 198 150 - 400 K/uL   nRBC 0.0 0.0 - 0.2 %    ASSESSMENT: Thomas [name] is a 70 y.o. male, 1 Day Post-Op s/p Procedure(s): Cephalomedullary nailing of right intertrochanteric femur  fracture  CV/Blood loss: Acute blood loss anemia, Hgb 12.3 this morning. Hemodynamically stable  PLAN: Weightbearing: WBAT RLE Incisional and dressing care: Plan to remove dressings from right hip tomorrow 05/07/2020 Showering: Okay to begin showering with assistance 05/08/2020 Orthopedic device(s): None  Pain management:  1. Tylenol 325-650 mg q 6 hours scheduled 2. Robaxin 500 mg q 6 hours PRN 3. Oxycodone 5-10 mg q 4 hours PRN 4. Neurontin 100 mg TID 5. Dilaudid 1 mg q 3 hours PRN VTE prophylaxis: Restart Elquis today, SCDs ID:  Ancef 2gm post op Foley/Lines:  No foley, KVO IVFs Impediments to Fracture Healing: Vit d level 28, start on supplementation Dispo:  Looks like patient is scheduled for echocardiogram this morning at 11 AM per cardiology. PT/OT evaluation today, dispo pending.  From orthopedic standpoint, will likely be ready for discharge in next 24 to 48 hours.  Patient very adamant about going home. Follow - up plan: 2 weeks for repeat x-rays and wound check  Contact information:  Katha Hamming MD, Patrecia Pace PA-C. After hours and holidays please check [email or web address] for group call information for [facility]   [name], PA-C [phone] (office) [email or web address]

## 2020-05-06 NOTE — Progress Notes (Signed)
Family Medicine Teaching Service Daily Progress Note Intern Pager: [phone]  Patient name: Thomas [name] Medical record number: [id number] Date of birth: [birth year] Age: 70 y.o. Gender: male  Primary Care Provider: [name], MD Consultants: Ortho Code Status: Full  Pt Overview and Major Events to Date:  Admitted 05/04/20   Assessment and Plan: Christyan Reger Coltraneis a 69 y.o.malepresenting with a R intertrochanteric hip fracture. PMH is significant forHFrEF, AFib, RA, HTN, Depression/Anxiety, and multiple toe amputations 2/2 osteomyelitis.   RHip Fracture, s/p Cephalomedullary nailing of right intertrochanteric femur fracture Patient indicates he is feeling well and his pain is well controlled.  - Ortho following, appreciate recs - Oxycodone-acetominophen 10-325mg  1-2 pills q4 hours for pain - IV dilaudid for breakthrough pain - Start Vit D and Ca suppl on d/c - PT/OT eval and treat   HFrEF, chronic, stable: Cardiology following.  Last echo in 10/21 with EF 10-15%.  Obtaining repeat Echo.  Home meds include torsemide 20mg  and 40 mEq KCl daily. BP very well controlled following Surgery, no signs of fluid overload. - Cards following, appreciate recs - Continue to hold Torsemide and KCl for now  MSSA Osteomyelitis of R great toe s/p amputation, stable: Has a PICC and has been receiving Ancef 2g daily. Last dose scheduled for 05/08/20.Pt. Remains afebrile and without elevated white count.  - Continue Ancef until 1/15  Afib, chronic: Takes amiodarone 200mg  and eliquis at home. - Restart Eliquis 5 mg BID - Continue amiodarone 200mg  daily  Rheumatoid Arthritis: Takes percocet, oxycodone, gabapentin, and prednisone 10mg  daily at home. Follows with [facility] rheumatology. - Pain control as above - Continue Gabapentin 300mg  TID - Continue prednisone 10 mg daily  HTN, chronic:stable BP 130/76. Home meds include lisinopril 10mg  daily. - Continue home  lisinopril 10mg .  Depression/Anxiety, chronic: OnValium and Cymbalta at home. - Continue home meds  FEN/GI: Regular Diet PPx: Eliquis   Status is: Inpatient  Remains inpatient appropriate because:Inpatient level of care appropriate due to severity of illness   Dispo: The patient is from: Home              Anticipated d/c is to: Home              Anticipated d/c date is: 2 days              Patient currently is not medically stable to d/c.    Subjective:  Patient indicates he is feeling well and his pain is well controlled.  Indicates desire to go home and will have good supervision and help while at home.  Objective: Temp:  [97.3 F (36.3 C)-98.3 F (36.8 C)] 97.4 F (36.3 C) (01/13 1421) Pulse Rate:  [59-78] 67 (01/13 1421) Resp:  [11-30] 17 (01/13 1421) BP: (109-166)/(56-88) 109/56 (01/13 1421) SpO2:  [92 %-99 %] 93 % (01/13 1421) Arterial Line BP: (123-151)/(93-105) 151/93 (01/12 2015) Weight:  [94.8 kg] 94.8 kg (01/12 1739) Physical Exam:  Physical Exam Constitutional:      General: He is not in acute distress. HENT:     Head: Normocephalic and atraumatic.     Mouth/Throat:     Mouth: Mucous membranes are moist.  Cardiovascular:     Rate and Rhythm: Normal rate and regular rhythm.  Pulmonary:     Effort: Pulmonary effort is normal.     Breath sounds: Normal breath sounds.  Abdominal:     General: Abdomen is flat. There is no distension.     Palpations: Abdomen is soft.  Tenderness: There is no abdominal tenderness.  Musculoskeletal:     Right lower leg: No edema.     Left lower leg: No edema.  Skin:    General: Skin is warm.     Capillary Refill: Capillary refill takes less than 2 seconds.  Neurological:     General: No focal deficit present.     Mental Status: He is alert.     Laboratory: Recent Labs  Lab 05/04/20 1120 05/05/20 0218 05/06/20 0231  WBC 8.1 6.3 6.3  HGB 12.2* 12.0* 12.3*  HCT 38.3* 36.5* 37.3*  PLT 193 184 198    Recent Labs  Lab 05/04/20 1120 05/05/20 0218  NA 137 137  K 3.5 3.5  CL 100 99  CO2 26 29  BUN 16 11  CREATININE 0.65 0.62  CALCIUM 9.1 8.8*  GLUCOSE 110* 92     Imaging/Diagnostic Tests:  EXAM: DG HIP (WITH OR WITHOUT PELVIS) 1V PORT RIGHT  COMPARISON:  May 05, 2020  FINDINGS: The patient has undergone intramedullary nail placement through the right femur. The hardware is intact. The osseous alignment is improved. There are degenerative changes of both hips and the lumbar spine. Multiple calcifications project over the patient's pelvis and are favored to represent phleboliths.  IMPRESSION: Status post intramedullary nail placement through the right femur. The osseous alignment is improved.   Electronically Signed   By: Constance Holster M.D.   On: 05/05/2020 22:40  Delora Fuel, MD 05/06/2020, 2:31 PM PGY-1, [place] Intern pager: [phone], text pages welcome

## 2020-05-07 ENCOUNTER — Encounter (HOSPITAL_COMMUNITY): Payer: Self-pay | Admitting: Student

## 2020-05-07 ENCOUNTER — Other Ambulatory Visit (HOSPITAL_COMMUNITY): Payer: Self-pay | Admitting: Family Medicine

## 2020-05-07 DIAGNOSIS — I5042 Chronic combined systolic (congestive) and diastolic (congestive) heart failure: Secondary | ICD-10-CM

## 2020-05-07 MED ORDER — OXYCODONE-ACETAMINOPHEN 10-325 MG PO TABS: 1.0000 | ORAL_TABLET | ORAL | 0 refills | Status: DC | PRN

## 2020-05-07 MED ORDER — METHOCARBAMOL 500 MG PO TABS: 500.0000 mg | ORAL_TABLET | Freq: Three times a day (TID) | ORAL | 0 refills | Status: DC | PRN

## 2020-05-07 MED ORDER — POLYETHYLENE GLYCOL 3350 17 G PO PACK: 17.0000 g | PACK | Freq: Every day | ORAL | Status: DC | PRN

## 2020-05-07 MED ORDER — OXYCODONE-ACETAMINOPHEN 5-325 MG PO TABS: 1.0000 | ORAL_TABLET | ORAL | 0 refills | Status: DC | PRN

## 2020-05-07 MED ORDER — HEPARIN SOD (PORK) LOCK FLUSH 100 UNIT/ML IV SOLN
250.0000 [IU] | INTRAVENOUS | Status: AC | PRN
  Administered 2020-05-07: 250 [IU]
  Filled 2020-05-07: qty 2.5

## 2020-05-07 MED ORDER — VITAMIN D3 25 MCG PO TABS: 1000.0000 [IU] | ORAL_TABLET | Freq: Every day | ORAL | 0 refills | Status: DC

## 2020-05-07 MED FILL — METHOCARBAMOL 500 MG TABS: 500 | 14 days supply | Qty: 42 | Fill #0

## 2020-05-07 MED FILL — OXYCODONE-APAP 10-325: 10-325 | 5 days supply | Qty: 30 | Fill #0

## 2020-05-07 MED FILL — VITAMIN D3 25 MCG TABS: 25 | 30 days supply | Qty: 30 | Fill #0

## 2020-05-07 NOTE — TOC CAGE-AID Note (Signed)
Transition of Care [facility]) - CAGE-AID Screening   Patient Details  Name: Thomas [name] MRN: [id number] Date of Birth: [birth year]  Transition of Care [facility]):  Thayer Ohm D, RN Phone Number: 05/07/2020, 9:54 AM   CAGE-AID Screening:    Have You Ever Felt You Ought to Cut Down on Your Drinking or Drug Use?: No Have People Annoyed You By [employer] Your Drinking Or Drug Use?: No Have You Felt Bad Or Guilty About Your Drinking Or Drug Use?: No Have You Ever Had a Drink or Used Drugs First Thing In The Morning to Steady Your Nerves or to Get Rid of a Hangover?: No CAGE-AID Score: 0

## 2020-05-07 NOTE — Care Management Important Message (Signed)
Important Message  Patient Details  Name: Thomas [name] MRN: [id number] Date of Birth: [birth year]   Medicare Important Message Given:  Yes     Katherine Tout P Sherran Margolis 05/07/2020, 2:30 PM

## 2020-05-07 NOTE — Progress Notes (Signed)
Single lumen PICC line flushes, heparinized and capped for d/c home. NBR noted. Pt aware and refuses to stay for possible TPA. Dr. Erin Hearing and I discussed and he is aware and ok with NBR.

## 2020-05-07 NOTE — Progress Notes (Signed)
Discharge instructions given; Pt in stable condition; Pt.'s wife in room and will be pt.'s ride home.

## 2020-05-07 NOTE — Progress Notes (Signed)
Occupational Therapy Treatment Patient Details Name: Thomas [name] MRN: [id number] DOB: 20-Feb-1951 Today's Date: 05/07/2020    History of present illness 70 y.o. male presented 05/04/20 with a R intertrochanteric hip fracture s/p fall. PMH is significant for HFrEF, AFib, RA, HTN, Depression/Anxiety, and multiple toe amputations 2/2 osteomyelitis. 05/05/20 ORIF of right intertrochanteric femur fracture   OT comments  Pt. Seen for skilled OT treatment session. Wife present and active in pts. Care.  Wife able to assist with bed mobility min a.  Pt. Amb. In room to est. Distance from bed to their reported location of their b.room with min a. States he will likely use urinal during the day.  Has most of the a/e but did express some interest with the sock aide.  Wife states she will go to the giftshop and check it out.  Eager for d/c home when able. Wife and son available to help as needed.  Follow Up Recommendations  Home health OT    Equipment Recommendations  3 in 1 bedside commode;Tub/shower bench;Other (comment)    Recommendations for Other Services      Precautions / Restrictions Precautions Precautions: Fall Restrictions Weight Bearing Restrictions: Yes RLE Weight Bearing: Weight bearing as tolerated       Mobility Bed Mobility Overal bed mobility: Needs Assistance Bed Mobility: Supine to Sit     Supine to sit: Min assist     General bed mobility comments: hob flat, exited on R side as he will at home, no rails used. wife assisted with guiding rle out of bed, pt. able to manage the rest without assistance  Transfers Overall transfer level: Needs assistance Equipment used: Rolling walker (2 wheeled) Transfers: Sit to/from [facility] Sit to Stand: Min assist;Mod assist Stand pivot transfers: Min assist            Balance                                           ADL either performed or assessed with clinical judgement   ADL  Overall ADL's : Needs assistance/impaired                         Toilet Transfer: Minimal assistance;RW;Ambulation Toilet Transfer Details (indicate cue type and reason): simulated in room from eob to b.room doorway then circled back to recliner. wife present and both report that was the distance to the b.room in his house so not needed to amb. all the way into b.room as pt. was notably in pain so that distance was appropriate for the needs                 Vision       Perception     Praxis      Cognition Arousal/Alertness: Awake/alert Behavior During Therapy: WFL for tasks assessed/performed Overall Cognitive Status: Within Functional Limits for tasks assessed                                          Exercises     Shoulder Instructions       General Comments      Pertinent Vitals/ Pain       Pain Assessment: Faces Faces Pain Scale: Hurts even more Pain Location: rt  hip Pain Descriptors / Indicators: Grimacing Pain Intervention(s): Limited activity within patient's tolerance;Monitored during session;Premedicated before session;Repositioned  Home Living                                          Prior Functioning/Environment              Frequency  Min 2X/week        Progress Toward Goals  OT Goals(current goals can now be found in the care plan section)  Progress towards OT goals: Progressing toward goals     Plan      Co-evaluation                 AM-PAC OT "6 Clicks" Daily Activity     Outcome Measure   Help from another person eating meals?: None Help from another person taking care of personal grooming?: A Little Help from another person toileting, which includes using toliet, bedpan, or urinal?: A Lot Help from another person bathing (including washing, rinsing, drying)?: A Lot Help from another person to put on and taking off regular upper body clothing?: A Little Help from another  person to put on and taking off regular lower body clothing?: A Lot 6 Click Score: 16    End of Session Equipment Utilized During Treatment: Gait belt;Rolling walker  OT Visit Diagnosis: Unsteadiness on feet (R26.81);Other abnormalities of gait and mobility (R26.89);History of falling (Z91.81);Pain;Other symptoms and signs involving cognitive function Pain - part of body: Hip;Leg   Activity Tolerance Patient tolerated treatment well   Patient Left in chair;with call bell/phone within reach;with family/visitor present   Nurse Communication          Time: 0263-7858 OT Time Calculation (min): 31 min  Charges: OT General Charges $OT Visit: 1 Visit OT Treatments $Self Care/Home Management : 23-37 mins  Sonia Baller, COTA/L Acute Rehabilitation [phone]   Janice Coffin 05/07/2020, 2:03 PM

## 2020-05-07 NOTE — Discharge Instructions (Addendum)
Orthopaedic Trauma Service Discharge Instructions   General Discharge Instructions  WEIGHT BEARING STATUS: Weightbearing as tolerated right lower extremity  RANGE OF MOTION/ACTIVITY: ok for hip and knee motion as tolerated  Wound Care: Incisions can be left open to air if there is no drainage. Do NOT apply any ointments, solutions or lotions to pin sites or surgical wounds.  These prevent needed drainage and even though solutions like hydrogen peroxide kill bacteria, they also damage cells lining the pin sites that help fight infection.  Applying lotions or ointments can keep the wounds moist and can cause them to breakdown and open up as well. This can increase the risk for infection. When in doubt call the office.  Once the incision is completely dry and without drainage, it may be left open to air out.  Showering may begin 36-48 hours later.  Cleaning gently with soap and water.   DVT/PE prophylaxis: Eliquis  Diet: as you were eating previously.  Can use over the counter stool softeners and bowel preparations, such as Miralax, to help with bowel movements.  Narcotics can be constipating.  Be sure to drink plenty of fluids  PAIN MEDICATION USE AND EXPECTATIONS  You have likely been given narcotic medications to help control your pain.  After a traumatic event that results in an fracture (broken bone) with or without surgery, it is ok to use narcotic pain medications to help control one's pain.  We understand that everyone responds to pain differently and each individual patient will be evaluated on a regular basis for the continued need for narcotic medications. Ideally, narcotic medication use should last no more than 6-8 weeks (coinciding with fracture healing).   As a patient it is your responsibility as well to monitor narcotic medication use and report the amount and frequency you use these medications when you come to your office visit.   We would also advise that if you are  using narcotic medications, you should take a dose prior to therapy to maximize you participation.  IF YOU ARE ON NARCOTIC MEDICATIONS IT IS NOT PERMISSIBLE TO OPERATE A MOTOR VEHICLE (MOTORCYCLE/CAR/TRUCK/MOPED) OR HEAVY MACHINERY DO NOT MIX NARCOTICS WITH OTHER CNS (CENTRAL NERVOUS SYSTEM) DEPRESSANTS SUCH AS ALCOHOL   STOP SMOKING OR USING NICOTINE PRODUCTS!!!!  As discussed nicotine severely impairs your body's ability to heal surgical and traumatic wounds but also impairs bone healing.  Wounds and bone heal by forming microscopic blood vessels (angiogenesis) and nicotine is a vasoconstrictor (essentially, shrinks blood vessels).  Therefore, if vasoconstriction occurs to these microscopic blood vessels they essentially disappear and are unable to deliver necessary nutrients to the healing tissue.  This is one modifiable factor that you can do to dramatically increase your chances of healing your injury.    (This means no smoking, no nicotine gum, patches, etc)  DO NOT USE NONSTEROIDAL ANTI-INFLAMMATORY DRUGS (NSAID'S)  Using products such as Advil (ibuprofen), Aleve (naproxen), Motrin (ibuprofen) for additional pain control during fracture healing can delay and/or prevent the healing response.  If you would like to take over the counter (OTC) medication, Tylenol (acetaminophen) is ok.  However, some narcotic medications that are given for pain control contain acetaminophen as well. Therefore, you should not exceed more than 4000 mg of tylenol in a day if you do not have liver disease.  Also note that there are may OTC medicines, such as cold medicines and allergy medicines that my contain tylenol as well.  If you have any questions  about medications and/or interactions please ask your doctor/PA or your pharmacist.      ICE AND ELEVATE INJURED/OPERATIVE EXTREMITY  Using ice and elevating the injured extremity above your heart can help with swelling and pain control.  Icing in a pulsatile fashion,  such as 20 minutes on and 20 minutes off, can be followed.    Do not place ice directly on skin. Make sure there is a barrier between to skin and the ice pack.    Using frozen items such as frozen peas works well as the conform nicely to the are that needs to be iced.  USE AN ACE WRAP OR TED HOSE FOR SWELLING CONTROL  In addition to icing and elevation, Ace wraps or TED hose are used to help limit and resolve swelling.  It is recommended to use Ace wraps or TED hose until you are informed to stop.    When using Ace Wraps start the wrapping distally (farthest away from the body) and wrap proximally (closer to the body)   Example: If you had surgery on your leg or thing and you do not have a splint on, start the ace wrap at the toes and work your way up to the thigh        If you had surgery on your upper extremity and do not have a splint on, start the ace wrap at your fingers and work your way up to the upper arm  [place]: [phone]   VISIT OUR WEBSITE FOR ADDITIONAL INFORMATION: [email or web address]     After speaking with pharmacy your last day of antibiotics would be tomorrow and they determined that it would be okay to stop 1 day earlier as this would not cause any adverse effects.  Because of this we opted to remove your PICC line today to save you from having to have it removed but you opted to go home with the PICC line and have it removed as scheduled.  I have written a 4 day course of Precocet 10 mg to be taken every 4 hours as needed.  Do not take any home doses of pain medication on top of this over this 4 day period.  Thanks, Dr Delora Fuel

## 2020-05-07 NOTE — Discharge Summary (Signed)
[facility] Discharge Summary  Patient name: Thomas [name] Medical record number: [id number] Date of birth: April 20, 1951 Age: 70 y.o. Gender: male Date of Admission: 05/04/2020  Date of Discharge: 05/07/2020 Admitting Physician: Richarda Osmond, DO  Primary Care Provider: [name], MD Consultants: [facility], Cardiology  Indication for Hospitalization: Hip Fracture  Discharge Diagnoses/Problem List: FrEF, AFib, RA, HTN, Depression/Anxiety, and multiple toe amputations 2/2 osteomyelitis, right hip fracture s/p repair.     Disposition: Able to be discharged home safely with home health PT/OT  Discharge Condition: Stable  Discharge Exam:   Physical Exam Constitutional:      General: He is not in acute distress.    Appearance: Normal appearance.  HENT:     Head: Normocephalic and atraumatic.     Mouth/Throat:     Mouth: Mucous membranes are moist.  Cardiovascular:     Rate and Rhythm: Normal rate and regular rhythm.     Pulses:          Dorsalis pedis pulses are 1+ on the right side and 1+ on the left side.  Pulmonary:     Effort: Pulmonary effort is normal.     Breath sounds: Normal breath sounds.  Musculoskeletal:     Right lower leg: No edema.     Left lower leg: No edema.  Skin:    General: Skin is warm.  Neurological:     Mental Status: He is alert.     Brief Hospital Course:  AARIAN [name] is a 70 y.o. male presenting with a R intertrochanteric hip fracture. PMH is significant for HFrEF, AFib, RA, HTN, Depression/Anxiety, and multiple toe amputations 2/2 osteomyelitis.    Right Hip Fracture Pt with acute R intertrochanteric hip fracture.  Seen by Orthopedic Surgery in ED and scheduled patient for ED.  Patient underwent successful cephalomedullary nailing of right intertrochanteric femur fracture.  Throughout hospitalization patient's pain was managed with Percocet, Robaxin and Fentanyl.  Remained hemodynamically  stable.  He was seen by PT following Surgery and able to be discharged safely with home health PT/OT arranged.  Discharged with 4 days of Percocet 10 mg.    HFrEF, chronic, stable Patient evaluated by Cardiology following Surgery for ECHO.  ECHO showed increase in EF to 45-50%. HR was well controlled.  Patient continued Lisinopril for good BP control.  Torsemide was held and restarted upon D/C.  Osteomyelitis During the hospitalization we opted to have the PICC line removed as the patient only had 1 dose of antibiotics left tomorrow, 05/08/2020 and after speaking with infectious disease pharmacy determined that this was not absolutely necessary.  We offered to remove the PICC line however the patient refused and stated he wanted to leave with the PICC line and that he would take care of having it removed with his home health nurse.  The patient was discharged with his PICC line with the plan to have 1 additional day of antibiotics for his osteomyelitis.      Issues for Follow Up:  1. Ensure patient's leg continues to heal and pain continues to improve. 2. Started on Vit D supplement on D/C, can also consider starting Calcium supplement.  Also recommend considering therapy outside of steroids for RA vs. Risk of Osteo. 3. Continue to wean patient down on opioid use following improved pain.   Significant Procedures: 2D ECHO  Significant Labs and Imaging:  Recent Labs  Lab 05/04/20 1120 05/05/20 0218 05/06/20 0231  WBC 8.1 6.3 6.3  HGB 12.2* 12.0* 12.3*  HCT 38.3* 36.5* 37.3*  PLT 193 184 198   Recent Labs  Lab 05/04/20 1120 05/05/20 0218  NA 137 137  K 3.5 3.5  CL 100 99  CO2 26 29  GLUCOSE 110* 92  BUN 16 11  CREATININE 0.65 0.62  CALCIUM 9.1 8.8*    EXAM: DG HIP (WITH OR WITHOUT PELVIS) 1V PORT RIGHT  COMPARISON:  May 05, 2020  FINDINGS: The patient has undergone intramedullary nail placement through the right femur. The hardware is intact. The osseous  alignment is improved. There are degenerative changes of both hips and the lumbar spine. Multiple calcifications project over the patient's pelvis and are favored to represent phleboliths.  IMPRESSION: Status post intramedullary nail placement through the right femur. The osseous alignment is improved.   Electronically Signed   By: Constance Holster M.D.   On: 05/05/2020 22:40  Results/Tests Pending at Time of Discharge:   None  Discharge Medications:  Allergies as of 05/07/2020      Reactions   Diltiazem Hcl Swelling   Chlorthalidone Other (See Comments)   "Makes me light-headed and I don't like the way it makes me feel"   Voltaren [diclofenac] Rash      Medication List    STOP taking these medications   diazepam 5 MG tablet Commonly known as: VALIUM     TAKE these medications   amiodarone 200 MG tablet Commonly known as: PACERONE Take 1 tablet (200 mg total) by mouth daily.   apixaban 5 MG Tabs tablet Commonly known as: ELIQUIS Take 1 tablet (5 mg total) by mouth 2 (two) times daily.   atorvastatin 40 MG tablet Commonly known as: LIPITOR Take 1 tablet (40 mg total) by mouth daily.   ceFAZolin  IVPB Commonly known as: ANCEF Inject 2 g into the vein every 8 (eight) hours. Indication:  Osteomyelitis First Dose: No Last Day of Therapy:  05/08/2020 Labs - Once weekly:  CBC/D and BMP, Labs - Every other week:  ESR and CRP Method of administration: IV Push Method of administration may be changed at the discretion of home infusion pharmacist based upon assessment of the patient and/or caregiver's ability to self-administer the medication ordered.   DULoxetine 60 MG capsule Commonly known as: CYMBALTA Take 1 capsule (60 mg total) by mouth daily.   gabapentin 300 MG capsule Commonly known as: NEURONTIN Take 1 capsule (300 mg total) by mouth 3 (three) times daily. To help with pain that is not relieved by pain medicatoin   lisinopril 10 MG tablet Commonly  known as: ZESTRIL Take 1 tablet (10 mg total) by mouth daily.   Melatonin 10 MG Tabs Take 10 mg by mouth at bedtime.   methocarbamol 500 MG tablet Commonly known as: ROBAXIN Take 1 tablet (500 mg total) by mouth 3 (three) times daily as needed for muscle spasms.   Narcan 4 MG/0.1ML Liqd nasal spray kit Generic drug: naloxone Place 1 spray into the nose as needed (as directed for emergency).   oxyCODONE-acetaminophen 10-325 MG tablet Commonly known as: Percocet Take 1-2 tablets by mouth every 4 (four) hours as needed for up to 5 days for pain (as needed).   potassium chloride SA 20 MEQ tablet Commonly known as: Klor-Con M20 Take 2 tablets (40 mEq total) by mouth daily.   predniSONE 10 MG tablet Commonly known as: DELTASONE Take 10 mg by mouth daily with breakfast.   torsemide 20 MG tablet Commonly known as: DEMADEX Take 1 tablet (  20 mg total) by mouth daily.   Vitamin D3 25 MCG tablet Commonly known as: Vitamin D Take 1 tablet (1,000 Units total) by mouth daily. Start taking on: May 08, 2020            Durable Medical Equipment  (From admission, onward)         Start     Ordered   05/06/20 1449  For home use only DME Other see comment  Once       Comments: ADL A/E kit  Question:  Length of Need  Answer:  Lifetime   05/06/20 1449   05/06/20 1448  For home use only DME 3 n 1  Once        05/06/20 1449   05/06/20 1448  For home use only DME Tub bench  Once        05/06/20 1449          Discharge Instructions: Please refer to Patient Instructions section of EMR for full details.  Patient was counseled important signs and symptoms that should prompt return to medical care, changes in medications, dietary instructions, activity restrictions, and follow up appointments.   Follow-Up Appointments:  Follow-up Information    Haddix, Thomasene Lot, MD. Schedule an appointment as soon as possible for a visit in 2 week(s).   Specialty: Orthopedic Surgery Why: for  repeat x-rays and wound check Contact information: [place] [address] [phone]        [facility] - Dme .        Health, [facility] Follow up.   Specialty: [place] Why: A representative will contact you after discharge to arrange home health services.              Delora Fuel, MD 05/07/2020, 9:44 PM PGY-1, [place]

## 2020-05-07 NOTE — Progress Notes (Addendum)
FPTS Interim Progress Note  O: BP 124/73 (BP Location: Right Arm)   Pulse (!) 57   Temp 97.6 F (36.4 C) (Oral)   Resp 18   Ht 6\' 1"  (1.854 m)   Wt 94.8 kg   SpO2 98%   BMI 27.57 kg/m   A/P: 70yo male who was admitted 1/11 with R hip fx occurring on 1/10.   POD 2 s/p cephalomedullary nailing of right interochanteric femur fracture - patient's pain moderately well controlled. Prescribe home treatment. F/u with PCP for further pain management discussions - continued hhPT - discharge today - f/u ortho in 2 weeks  H/o osteomyelitis of R foot - Ancef course due to finish tomorrow. curbsided ID, Dr Linus Salmons. Patient is doing well so okay to end treatment today and with dc picc line. Order placed.  F/u outpatient  Afib: repeat echo as part of post-procedure clearance this admission showed increase in EF to 45-50%. HR was well controlled.  - restarted eliquis after procedure - continue amiodarone   More complete discharge summary to follow.   Richarda Osmond, DO 05/07/2020, 2:54 PM PGY-3, [place] [facility] Service pager [phone]

## 2020-05-07 NOTE — TOC Initial Note (Addendum)
Transition of Care [facility]) - Initial/Assessment Note    Patient Details  Name: Thomas [name] MRN: [id number] Date of Birth: 05-13-1950  Transition of Care [facility]) CM/SW Contact:    Coralee Pesa, [place] Phone Number: 05/07/2020, 11:42 AM  Clinical Narrative:                 CSW spoke with pt at bedside and discussed with him the recommendation for [facility]. Pt was agreeable and noted that he was already active with a [place] agency from his knee replacement several years ago. Pt was with [facility] and they were contacted and will provide care once [facility]. CSW also discussed the recommendations for equipment, pt noted he still had things from his last hospitalization. However, he was interested in an ADL kit, that CSW was advised, could be purchased in the gift shop, or at DME stores. CSW will update pt and will follow for further DC needs.  3:00  CSW spoke with pt's wife at bedside who advised CSW that she was unsure if she wanted to continue using [facility]. She stated that she blames them for him getting hurt and has not been able to get any assistance from them in resolving the issue. She did note that if they could get some feedback and assistance then they would consider using them again. CSW contacted [place] with [facility] and she will involve the appropriate people and follow  Up with information. CSW will continue to follow for DC needs.  3:30 CSW received a call from [facility] who had spoken to pt's wife. They have agreed to use [facility] and are ready for DC from a TOC perspective. Please consult TOC if other needs arise.  Expected Discharge Plan: [place] Barriers to Discharge: Continued Medical Work up   Patient Goals and CMS Choice Patient states their goals for this hospitalization and ongoing recovery are:: Pt states he would like to recieve PT at home, so he can get back on his feet. CMS Medicare.gov Compare Post Acute Care list provided to:: Patient Choice offered to /  list presented to : Patient  Expected Discharge Plan and Services Expected Discharge Plan: [place] Choice: [place] arrangements for the past 2 months: [place]: PT,OT [place] Agency: [place] ([place]) Date [place]: 05/07/20 Time [place]: 1139 Representative spoke with at [place]: [place] Arrangements/Services Living arrangements for the past 2 months: [place] with:: Spouse Patient language and need for interpreter reviewed:: Yes Do you feel safe going back to the place where you live?: Yes      Need for Family Participation in Patient Care: No (Comment) Care giver support system in place?: Yes (comment) Current home services: DME Criminal Activity/Legal Involvement Pertinent to Current Situation/Hospitalization: No - Comment as needed  Activities of Daily Living Home Assistive Devices/Equipment: Other (Comment) (ortho boot) ADL Screening (condition at time of admission) Patient's cognitive ability adequate to safely complete daily activities?: Yes Is the patient deaf or have difficulty hearing?: No Does the patient have difficulty seeing, even when wearing glasses/contacts?: No Does the patient have difficulty concentrating, remembering, or making decisions?: No Patient able to express need for assistance with ADLs?: Yes Does the patient have  difficulty dressing or bathing?: No Independently performs ADLs?: Yes (appropriate for developmental age) Does the patient have difficulty walking or climbing stairs?: No Weakness of Legs: None Weakness of Arms/Hands: None  Permission Sought/Granted Permission sought to share information with : Family Supports Permission granted to share information with : Yes, Verbal Permission Granted  Share Information with NAME: Tayte Mcwherter     Permission granted to share info  w Relationship: Wife  Permission granted to share info w Contact Information: [phone]  Emotional Assessment Appearance:: Appears stated age Attitude/Demeanor/Rapport: Engaged Affect (typically observed): Pleasant Orientation: : Oriented to Self,Oriented to Place,Oriented to  Time,Oriented to Situation Alcohol / Substance Use: Not Applicable Psych Involvement: No (comment)  Admission diagnosis:  Hip fracture ([place]) [S72.009A] Closed fracture of right hip, initial encounter ([place]) [S72.001A] Patient Active Problem List   Diagnosis Date Noted  . Chronic combined systolic and diastolic CHF, NYHA class 2 ([place])   . Osteoporosis 05/05/2020  . Hip fracture ([place]) 05/04/2020  . Acute osteomyelitis of toe, right ([place])   . Osteomyelitis of ankle or foot, acute, unspecified laterality ([place]) 03/25/2020  . Foot infection   . NICM (nonischemic cardiomyopathy) ([place]), no significant CAD on cardiac cath   02/12/2020  . Right foot pain   . Acute systolic heart failure ([place])   . Rheumatoid arthritis ([place])   . Atrial flutter ([place]) 02/02/2020  . Lower extremity edema 12/30/2019  . Weight loss, non-intentional 11/24/2019  . Chronic right hip pain 06/19/2019  . Degenerative tear of acetabular labrum of right hip 05/02/2019  . Primary osteoarthritis of right hip 05/02/2019  . Skin ulcer ([place]) 01/29/2019  . Malignant neoplasm of prostate ([place]) 01/16/2018  . Insomnia 10/02/2017  . Cyclic citrullinated peptide (CCP) antibody positive 05/31/2016  . S/P bilateral TKA 09/13/2015  . Other bilateral secondary osteoarthritis of knee 07/14/2014  . Heme positive stool 05/21/2013  . Osteoarthritis, multiple sites 01/13/2011  . Hypertension 12/06/2010  . ED (erectile dysfunction) 08/25/2010  . CLAUSTROPHOBIA 03/01/2010   PCP:  [name], MD Pharmacy:   [id number] [place] [address] [place] [address]Phone: [phone]:  [phone] LLC [place] [address]Phone: [phone]: [phone]    Social Determinants of Health (SDOH) Interventions    Readmission Risk Interventions No flowsheet data found.

## 2020-05-07 NOTE — Progress Notes (Signed)
Orthopaedic Trauma Progress Note  SUBJECTIVE: Reports mild pain about operative site. No chest pain. No SOB. No nausea/vomiting. No other complaints.  Eager to get home but does seem somewhat concerned about pain control once he gets home. Pain appears to well controlled currently  OBJECTIVE:  Vitals:   05/07/20 0437 05/07/20 0739  BP: 135/76 124/73  Pulse: (!) 51 (!) 57  Resp:  18  Temp: (!) 97.5 F (36.4 C) 97.6 F (36.4 C)  SpO2: 97% 98%    General: Sitting up in bed, no acute distress Respiratory: No increased work of breathing.  Right lower extremity: Dressings removed, incisions clean, dry, intact.  No significant tenderness with palpation of the hip or throughout the thigh.  Tolerates gentle knee motion.  Sensation intact distally.  Skin warm and dry. + DP pulse  IMAGING: Stable post op imaging.   LABS:  No results found for this or any previous visit (from the past 24 hour(s)).  ASSESSMENT: Thomas [name] is a 70 y.o. male, 2 Days Post-Op s/p Cephalomedullary nailing of right intertrochanteric femur fracture  CV/Blood loss: Hgb stable. Hemodynamically stable  PLAN: Weightbearing: WBAT RLE Incisional and dressing care: Ok to leave incisions open to air Showering: Okay to begin showering with assistance 05/08/2020 Orthopedic device(s): None  Pain management:  1. Tylenol 325-650 mg q 6 hours scheduled 2. Robaxin 500 mg q 6 hours PRN 3. Percocet 5-325 mg q 4 hours PRN 4. Neurontin 300 mg TID VTE prophylaxis: Eliquis, SCDs ID:  Ancef 2gm for osteomyelitis Foley/Lines:  No foley, KVO IVFs Impediments to Fracture Healing: Vit d level 28, continue supplementation Dispo: Therapies as tolerated, PT/OT recommending home health therapies. Patient ok for d/c from ortho standpoint Follow - up plan: 2 weeks for repeat x-rays and wound check  Contact information:  Katha Hamming MD, Patrecia Pace PA-C. After hours and holidays please check [email or web address] for group call information for  [facility]   [name] [name], PA-C [phone] (office) [email or web address]

## 2020-05-07 NOTE — Progress Notes (Addendum)
Physical Therapy Treatment Patient Details Name: Thomas [name] MRN: [id number] DOB: 06/12/1950 Today's Date: 05/07/2020    History of Present Illness 70 y.o. male presented 05/04/20 with a R intertrochanteric hip fracture s/p fall. PMH is significant for HFrEF, AFib, RA, HTN, Depression/Anxiety, and multiple toe amputations 2/2 osteomyelitis. 05/05/20 ORIF of right intertrochanteric femur fracture    PT Comments    Pt up in chair on arrival, agreeable to therapy session with good participation and tolerance for session. Pt presents with increased R hip pain, pt premedicated ~90 mins prior to session but reporting pain still 9/10 (was 10/10 when medicated). Pt performed gait trial 52ft using RW and minA with chair follow for safety due to reported severe pain, but no overt LOB. Pt +24minA for safety with sit<>stand and reports he will have +2 support at home from spouse/son. Reviewed seated/supine RLE HEP handout (hip handout) and use of ice for pain/edema reduction. Pt continues to benefit from PT services to progress toward functional mobility goals. Continue to recommend DC home once medically cleared, pt reports he won't need to walk more than 54ft to enter home or get to bathroom.  Follow Up Recommendations  Home health PT;Supervision/Assistance - 24 hour     Equipment Recommendations  None recommended by PT    Recommendations for Other Services       Precautions / Restrictions Precautions Precautions: Fall Precaution Comments: fell carrying objects up steps Restrictions Weight Bearing Restrictions: Yes RLE Weight Bearing: Weight bearing as tolerated    Mobility  Bed Mobility Overal bed mobility: Needs Assistance Bed Mobility: Supine to Sit     Supine to sit: Min assist     General bed mobility comments: pt up in chair pre/post  Transfers Overall transfer level: Needs assistance Equipment used: Rolling walker (2 wheeled) Transfers: Sit to/from Stand Sit to Stand: Min  assist;+2 safety/equipment Stand pivot transfers: Min assist       General transfer comment: cues for BLE placement and technique, pt needs reinforcement of foot placement prior to sitting; good controlled descent  Ambulation/Gait Ambulation/Gait assistance: Min assist;+2 safety/equipment Gait Distance (Feet): 30 Feet Assistive device: Rolling walker (2 wheeled) Gait Pattern/deviations: Step-to pattern (downward gaze) Gait velocity: grossly <0.3 m/s   General Gait Details: very flexed torso (could improve with cues); max cues for sequencing (with each step)   Stairs             Wheelchair Mobility    Modified Rankin (Stroke Patients Only)       Balance Overall balance assessment: Needs assistance;History of Falls Sitting-balance support: No upper extremity supported;Feet supported Sitting balance-Leahy Scale: Fair     Standing balance support: Bilateral upper extremity supported Standing balance-Leahy Scale: Poor Standing balance comment: heavily reliant on BUE support of RW and chair follow for safety; no LOB                            Cognition Arousal/Alertness: Awake/alert Behavior During Therapy: WFL for tasks assessed/performed Overall Cognitive Status: Within Functional Limits for tasks assessed Area of Impairment: Attention;Safety/judgement;Awareness;Problem solving                   Current Attention Level: Selective     Safety/Judgement: Decreased awareness of safety;Decreased awareness of deficits Awareness: Intellectual Problem Solving: Difficulty sequencing;Requires verbal cues;Requires tactile cues General Comments: motivated to progress mobility, seems more aware of his deficits this session; spouse present and supportive  Exercises Total Joint Exercises Ankle Circles/Pumps: AROM;Strengthening;Both;10 reps;Supine Heel Slides: AROM;Strengthening;Supine;5 reps;Right Hip ABduction/ADduction: AROM;Strengthening;Right;5  reps;Supine Long Arc Quad: AROM;Right;10 reps;Seated Marching in Standing:  (verbal/visual demo) Standing Hip Extension:  (verbal/visual demo)    General Comments General comments (skin integrity, edema, etc.): pt instructed on positioning/elevation of leg, use of ice, HEP      Pertinent Vitals/Pain Pain Assessment: 0-10 Pain Score: 9  Faces Pain Scale: Hurts even more Pain Location: rt hip Pain Descriptors / Indicators: Grimacing;Aching;Operative site guarding Pain Intervention(s): Monitored during session;Premedicated before session;Repositioned;Ice applied (pain meds 1.5 hr prior to session)    Home Living                      Prior Function            PT Goals (current goals can now be found in the care plan section) Acute Rehab PT Goals Patient Stated Goal: go home ASAP PT Goal Formulation: With patient Time For Goal Achievement: 05/20/20 Potential to Achieve Goals: Good Progress towards PT goals: Progressing toward goals    Frequency    Min 5X/week      PT Plan Current plan remains appropriate    Co-evaluation              AM-PAC PT "6 Clicks" Mobility   Outcome Measure  Help needed turning from your back to your side while in a flat bed without using bedrails?: A Little Help needed moving from lying on your back to sitting on the side of a flat bed without using bedrails?: A Little Help needed moving to and from a bed to a chair (including a wheelchair)?: A Little Help needed standing up from a chair using your arms (e.g., wheelchair or bedside chair)?: A Little Help needed to walk in hospital room?: A Little Help needed climbing 3-5 steps with a railing? : Total 6 Click Score: 16    End of Session Equipment Utilized During Treatment: Gait belt Activity Tolerance: Patient tolerated treatment well Patient left: in chair;with call bell/phone within reach;with chair alarm set Nurse Communication: Mobility status (ready to go) PT Visit  Diagnosis: Other abnormalities of gait and mobility (R26.89);History of falling (Z91.81);Pain Pain - Right/Left: Right Pain - part of body: Hip     Time: 1340-1404 PT Time Calculation (min) (ACUTE ONLY): 24 min  Charges:  $Gait Training: 8-22 mins $Therapeutic Exercise: 8-22 mins                     Parisha Beaulac P., PTA Acute Rehabilitation Services Pager: [phone] Office: [place] 05/07/2020, 3:43 PM

## 2020-05-11 ENCOUNTER — Telehealth: Payer: Self-pay

## 2020-05-11 NOTE — Telephone Encounter (Signed)
Error

## 2020-05-11 NOTE — Telephone Encounter (Signed)
Claiborne Billings, [facility] PT, calls nurse line reporting patient refused [facility] PT services. Claiborne Billings reports he is ambulating well and she feels this is ok for him to decline. Claiborne Billings did request a VO for nursing to come out and remove his PICC line. Claiborne Billings reports his IV antibiotics were completed on 01/15.   VO given to have [facility] RN go out to his home.

## 2020-05-12 ENCOUNTER — Encounter (HOSPITAL_COMMUNITY): Payer: Self-pay | Admitting: Nurse Practitioner

## 2020-05-12 ENCOUNTER — Other Ambulatory Visit (HOSPITAL_COMMUNITY): Payer: Medicare Other

## 2020-05-17 ENCOUNTER — Telehealth: Payer: Self-pay

## 2020-05-17 NOTE — Telephone Encounter (Signed)
Joellen Jersey, [facility] RN calling for nursing verbal orders as follows:  1 time(s) weekly for 3 week(s) Verbal orders given per [facility] protocol  Talbot Grumbling, RN

## 2020-05-18 ENCOUNTER — Other Ambulatory Visit (HOSPITAL_COMMUNITY): Payer: Self-pay | Admitting: Student

## 2020-05-19 MED FILL — OXYCODONE-APAP 10-325: 10-325 | 3 days supply | Qty: 24 | Fill #0

## 2020-05-20 DIAGNOSIS — M25559 Pain in unspecified hip: Secondary | ICD-10-CM | POA: Diagnosis not present

## 2020-05-20 DIAGNOSIS — Z79891 Long term (current) use of opiate analgesic: Secondary | ICD-10-CM | POA: Diagnosis not present

## 2020-05-20 DIAGNOSIS — M069 Rheumatoid arthritis, unspecified: Secondary | ICD-10-CM | POA: Diagnosis not present

## 2020-05-20 DIAGNOSIS — S73199A Other sprain of unspecified hip, initial encounter: Secondary | ICD-10-CM | POA: Diagnosis not present

## 2020-05-20 DIAGNOSIS — G894 Chronic pain syndrome: Secondary | ICD-10-CM | POA: Diagnosis not present

## 2020-05-20 DIAGNOSIS — Z79899 Other long term (current) drug therapy: Secondary | ICD-10-CM | POA: Diagnosis not present

## 2020-05-21 ENCOUNTER — Telehealth: Payer: Self-pay

## 2020-05-21 ENCOUNTER — Telehealth: Payer: Self-pay | Admitting: *Deleted

## 2020-05-21 NOTE — Telephone Encounter (Signed)
Patient calls nurse line requesting to speak to Dr. Erin Hearing regarding pain management. Of note, patient is established with pain management specialist, however, they will not prescribe pain meds for recent surgical procedure.   Patient reports having hip surgery on 05/06/20. States that he does not currently have any medications to manage his pain.   Patient is requesting returned phone call from Dr. Erin Hearing as soon as possible to discuss pain management for post surgical pain. Advised patient that Dr. Erin Hearing is currently out of the office but that I would send message to provider regarding situation. Patient verbalizes understanding and is appreciative.   Talbot Grumbling, RN

## 2020-05-21 NOTE — Telephone Encounter (Signed)
Patient called stating that someone from our office tried to contact him.  Returned call back to patient explaining that there was no message documented from our office and to try his PcP. Verbalized understanding.

## 2020-05-21 NOTE — Telephone Encounter (Signed)
Patient calls nurse line stating he missed a call from a male. Patient reports the message seemed urgent. Unsure of who attempted to call. Will forward to [place] as I believe she is covering for PCP.   You can reach him at this number [phone]

## 2020-05-22 MED FILL — OXYCODONE-APAP 10-325: 10-325 | 7 days supply | Qty: 42 | Fill #0

## 2020-05-22 NOTE — Progress Notes (Signed)
Cardiology Office Note:    Date:  05/25/2020   ID:  Thomas [name], DOB 03/25/1951, MRN [id number]  PCP:  [name], MD  [facility] Cardiologist:  Freada Bergeron, MD  [facility] Electrophysiologist:  None   Referring MD: [name], *   History of Present Illness:    Thomas [name] is a 70 y.o. male with a hx of of chronic systolic CHF/non-ischemic cardiomyopathy with EF of <20%, atrial flutter s/p DCCV in 01/2020 on Amiodarone and Eliquis, hypertension, recent osteomyelitis s/p multiple toe amputations, rheumatoid arthritis, prostate cancer, and anxiety/depression who presents to clinic for follow-up.  He was admitted from 02/02/2020 to 02/12/2020 for atrial flutter with RVR and newly diagnosed acute systolic CHF with gross volume overload. Echo showed LVEF of <20% with global hypokinesis, biatrial enlargement, mild MR, and moderate bilateral pleural effusions. RV mildly enlarged but systolic function normal. Patient was diuresed with IV Lasix and then underwent right/left cardiac catheterization on 02/09/2020 which showed minimal CAD, mildly reduced Fick cardiac output/index, and upper normal left/heart pressures. He then underwent successful TEE/DCCV on 02/11/2020. Following DCCV, he became somnolent and hypotensive requiring pressors but rapidly improved and these were able to be weaned off quickly. He was discharged on Torsemide $RemoveBefo'20mg'ocKTJZmWhoo$  twice daily, Amiodarone $RemoveBeforeDEI'200mg'IWFqplSdpBmXzOhf$  twice daily, and Eliquis $RemoveBef'5mg'qBCIhMwlmE$  twice daily. Unfortunately BP limited our ability to add guideline directed medical therapy for CHF.  Patient was seen by Marcellina Millin, NP, for follow-up on 03/31/2020. He reported having left foot surgery the week prior to this visit and had a PICC line placed for antibiotics. He was doing well from a cardiac standpoint. Amiodarone was decreased to $RemoveBefo'200mg'zrJwSkoMtUM$  once daily and Torsemide was decreased to $RemoveBefo'20mg'dRoHsRfBHjg$  once daily. Lisinopril $RemoveBeforeDEI'10mg'qeECiGEyzeldUwmQ$  once daily was added.  Patient  presented to the ED on 05/05/2019 after a fall and was found to have a right intertrochanteric hip fractures. Cardiology consulted for pre-op evaluation. He underwent the procedure without complications. TTE on that admission with improved LVEF 45-50%.  He now presents to clinic for follow-up. Other than the pain in his legs, he has no chest pain, SOB, LE edema, orthopnea, or PND. He has been struggling to ambulate with a walker and a cane due to pain. He is very frustrated that his oxycodone cannot be filled for several weeks and is hoping to see ortho sooner to help with his pain management. Otherwise, he has been taking all of his medications as prescribed. He is trying to remain active as much as possible. Has intermittent LE edema but much improved from prior.  Past Medical History:  Diagnosis Date  . Atrial flutter ([place]) 01/2020  . Cancer [facility])    prostate  . Claustrophobia    quite severe  . Heart failure with reduced ejection fraction ([place])   . Hypertension   . Hypoglycemia    occ  . Left knee DJD    Xray 12/23/08  . NICM (nonischemic cardiomyopathy) ([place]) 02/12/2020  . Prostate cancer [facility])     Past Surgical History:  Procedure Laterality Date  . AMPUTATION TOE Right 12/2019  . AMPUTATION TOE Right 03/26/2020   Procedure: Right 3rd toe partial amputation, bone biopsy right 1st metatarsal;  Surgeon: Evelina Bucy, DPM;  Location: [facility];  Service: Podiatry;  Laterality: Right;  . BUBBLE STUDY  02/11/2020   Procedure: BUBBLE STUDY;  Surgeon: Geralynn Rile, MD;  Location: [place];  Service: Cardiovascular;;  . CARDIOVERSION N/A 02/11/2020   Procedure: CARDIOVERSION;  Surgeon:  Geralynn Rile, MD;  Location: [place];  Service: Cardiovascular;  Laterality: N/A;  . CYSTOSCOPY  04/11/2018   Procedure: Erlene Quan;  Surgeon: [name], MD;  Location: [facility];  Service: Urology;;  NO SEEDS FOUND IN BLADDER  . HERNIA REPAIR   2009   inguinal  . INTRAMEDULLARY (IM) NAIL INTERTROCHANTERIC Right 05/05/2020   Procedure: CPT 27245-Cephalomedullary nailing of right intertrochanteric femur fracture;  Surgeon: Shona Needles, MD;  Location: [place];  Service: Orthopedics;  Laterality: Right;  . IRRIGATION AND DEBRIDEMENT FOOT Left 03/26/2020   Procedure: Left foot incision and drainage with removal of all non-viable soft tissue and bone - areas overlying the 2nd and 5th metatarsals.;  Surgeon: Evelina Bucy, DPM;  [facility]: [facility];  Service: Podiatry;  Laterality: Left;  . JOINT REPLACEMENT Bilateral 2017   knees  . RADIOACTIVE SEED IMPLANT N/A 04/11/2018   Procedure: RADIOACTIVE SEED IMPLANT/BRACHYTHERAPY IMPLANT;  Surgeon: [name], MD;  Location: [facility];  Service: Urology;  Laterality: N/A;   71     SEEDS IMPLANTED  . RIGHT/LEFT HEART CATH AND CORONARY ANGIOGRAPHY N/A 02/09/2020   Procedure: RIGHT/LEFT HEART CATH AND CORONARY ANGIOGRAPHY;  Surgeon: Nelva Bush, MD;  Location: [place] [facility];  Service: Cardiovascular;  Laterality: N/A;  . SPACE OAR INSTILLATION N/A 04/11/2018   Procedure: SPACE OAR INSTILLATION;  Surgeon: [name], MD;  Location: [facility];  Service: Urology;  Laterality: N/A;  . TEE WITHOUT CARDIOVERSION N/A 02/11/2020   Procedure: TRANSESOPHAGEAL ECHOCARDIOGRAM (TEE);  Surgeon: Geralynn Rile, MD;  Location: [place];  Service: Cardiovascular;  Laterality: N/A;  . TOTAL KNEE ARTHROPLASTY Bilateral 09/13/2015   Procedure: BILATERAL KNEE ARTHROPLASTY ;  Surgeon: Paralee Cancel, MD;  Location: [facility];  Service: Orthopedics;  Laterality: Bilateral;    Current Medications: Current Meds  Medication Sig  . amiodarone (PACERONE) 200 MG tablet Take 1 tablet (200 mg total) by mouth daily.  Marland Kitchen apixaban (ELIQUIS) 5 MG TABS tablet Take 1 tablet (5 mg total) by mouth 2 (two) times daily.  Marland Kitchen atorvastatin (LIPITOR) 40 MG tablet Take 1 tablet  (40 mg total) by mouth daily.  . cholecalciferol (VITAMIN D) 25 MCG tablet Take 1 tablet (1,000 Units total) by mouth daily.  . diazepam (VALIUM) 5 MG tablet Take 5 mg by mouth at bedtime.  . DULoxetine (CYMBALTA) 60 MG capsule Take 1 capsule (60 mg total) by mouth daily.  Marland Kitchen lisinopril (ZESTRIL) 10 MG tablet Take 1 tablet (10 mg total) by mouth daily.  . Melatonin 10 MG TABS Take 10 mg by mouth at bedtime.  . methocarbamol (ROBAXIN) 500 MG tablet Take 1 tablet (500 mg total) by mouth 3 (three) times daily as needed for muscle spasms.  . metoprolol succinate (TOPROL XL) 25 MG 24 hr tablet Take 0.5 tablets (12.5 mg total) by mouth daily.  Marland Kitchen NARCAN 4 MG/0.1ML LIQD nasal spray kit Place 1 spray into the nose as needed (as directed for emergency).  Marland Kitchen oxyCODONE-acetaminophen (PERCOCET) 10-325 MG tablet TAKE 1 TABLET BY MOUTH EVERY FOUR HOURS AS NEEDED FOR SEVERE PAIN  . potassium chloride SA (KLOR-CON M20) 20 MEQ tablet Take 2 tablets (40 mEq total) by mouth daily.  . predniSONE (DELTASONE) 10 MG tablet Take 10 mg by mouth daily with breakfast.   . torsemide (DEMADEX) 20 MG tablet Take 1 tablet (20 mg total) by mouth daily.     Allergies:   Diltiazem hcl, Chlorthalidone, and  Voltaren [diclofenac]   Social History   Socioeconomic History  . Marital status: Married    Spouse name: Not on file  . Number of children: Not on file  . Years of education: Not on file  . Highest education level: Not on file  Occupational History  . Not on file  Tobacco Use  . Smoking status: Never Smoker  . Smokeless tobacco: Never Used  Vaping Use  . Vaping Use: Never used  Substance and Sexual Activity  . Alcohol use: No    Alcohol/week: 0.0 standard drinks  . Drug use: Not Currently  . Sexual activity: Yes  Other Topics Concern  . Not on file  Social History Narrative  . Not on file   Social Determinants of Health   Financial Resource Strain: Not on file  Food Insecurity: Not on file  Transportation  Needs: Not on file  Physical Activity: Not on file  Stress: Not on file  Social Connections: Not on file     Family History: The patient's family history includes Prostate cancer in his brother and father. There is no history of Colon cancer, Rectal cancer, or Stomach cancer.  ROS:   Please see the history of present illness.    Review of Systems  Constitutional: Negative for chills and fever.  HENT: Negative for nosebleeds.   Eyes: Negative for blurred vision.  Respiratory: Negative for shortness of breath.   Cardiovascular: Positive for leg swelling. Negative for chest pain, palpitations, orthopnea, claudication and PND.  Gastrointestinal: Negative for blood in stool and melena.  Genitourinary: Negative for hematuria.  Musculoskeletal: Positive for falls, joint pain and myalgias.  Neurological: Negative for dizziness and loss of consciousness.  Endo/Heme/Allergies: Negative for polydipsia.  Psychiatric/Behavioral: Negative for memory loss.    EKGs/Labs/Other Studies Reviewed:    The following studies were reviewed today: Echocardiogram 05/06/20: IMPRESSIONS  1. Left ventricular ejection fraction, by estimation, is 45 to 50%. The  left ventricle has mildly decreased function. The left ventricle  demonstrates global hypokinesis. Left ventricular diastolic parameters are  consistent with Grade I diastolic  dysfunction (impaired relaxation).  2. Right ventricular systolic function is normal. The right ventricular  size is normal. There is normal pulmonary artery systolic pressure. The  estimated right ventricular systolic pressure is 10.2 mmHg.  3. The mitral valve is normal in structure. No evidence of mitral valve  regurgitation.  4. The aortic valve was not well visualized. Aortic valve regurgitation  is not visualized. No aortic stenosis is present.  5. The inferior vena cava is dilated in size with >50% respiratory  variability, suggesting right atrial pressure of 8  mmHg.   Echocardiogram 02/02/2020: Impression: 1. While there is no visualized LV thrombus on sweeps of the apex,  patient did not have IV access, unable to use echo contrast for  confirmation. Left ventricular ejection fraction, by estimation, is <20%.  The left ventricle has severely decreased  function. The left ventricle demonstrates global hypokinesis. The left  ventricular internal cavity size was moderately dilated. Left ventricular  diastolic function could not be evaluated.  2. Right ventricular systolic function is normal. The right ventricular  size is mildly enlarged. There is mildly elevated pulmonary artery  systolic pressure.  3. Left atrial size was severely dilated.  4. Right atrial size was severely dilated.  5. Moderate pleural effusion in both left and right lateral regions.  6. The mitral valve is normal in structure. Mild mitral valve  regurgitation.  7. The aortic valve  is tricuspid. There is mild calcification of the  aortic valve. Aortic valve regurgitation is not visualized. Mild aortic  valve sclerosis is present, with no evidence of aortic valve stenosis.  8. The inferior vena cava is dilated in size with <50% respiratory  variability, suggesting right atrial pressure of 15 mmHg.   Conclusion(s)/Recommendation(s): Patient in atrial flutter with RVR during  study. Discussed severely reduced EF with Dr. Gasper Sells, cardiologist  on call.  _______________  Right/Left Cardiac Catheterization 02/09/2020: Conclusions: 1. No angiographically significant coronary artery disease. 2. Upper normal left heart, right heart, and pulmonary artery pressures. 3. Mildly reduced Fick cardiac output/index.  Recommendations: 1. Restart heparin infusion 2 hours after TR band removal. If there is no evidence of bleeding/vascular complication, consider transitioning back to apixaban as soon as tomorrow. 2. Proceed with TEE/cardioversion as soon as tomorrow. I  will make Mr. Wilford NPO after midnight in anticipation of this. 3. Optimize evidence-based heart failure therapy for non-ischemic cardiomyopathy. 4. Primary prevention of coronary artery disease.  Diagnostic Dominance: Right      Recent Labs: 12/25/2019: BNP 901.9 02/12/2020: Magnesium 2.0 03/26/2020: ALT 15 03/31/2020: TSH 3.740 05/05/2020: BUN 11; Creatinine, Ser 0.62; Potassium 3.5; Sodium 137 05/06/2020: Hemoglobin 12.3; Platelets 198  Recent Lipid Panel    Component Value Date/Time   CHOL 111 02/04/2020 0452   TRIG 72 02/04/2020 0452   HDL 32 (L) 02/04/2020 0452   CHOLHDL 3.5 02/04/2020 0452   VLDL 14 02/04/2020 0452   LDLCALC 65 02/04/2020 0452   LDLDIRECT 94 12/06/2010 0917     Risk Assessment/Calculations:    CHA2DS2-VASc Score = 3  }This indicates a 3.2% annual risk of stroke. The patient's score is based upon: CHF History: Yes HTN History: Yes Diabetes History: No Stroke History: No Vascular Disease History: No Age Score: 1 Gender Score: 0    Physical Exam:    VS:  BP 130/90   Pulse 78   Ht 6\' 8"  (2.032 m)   Wt 202 lb 6.4 oz (91.8 kg)   SpO2 99%   BMI 22.23 kg/m     Wt Readings from Last 3 Encounters:  05/25/20 202 lb 6.4 oz (91.8 kg)  05/05/20 209 lb (94.8 kg)  04/23/20 205 lb (93 kg)     GEN:  Well nourished, well developed in no acute distress HEENT: Normal NECK: No JVD; No carotid bruits CARDIAC: RRR, no murmurs, rubs, gallops RESPIRATORY:  Clear to auscultation without rales, wheezing or rhonchi  ABDOMEN: Soft, non-tender, non-distended MUSCULOSKELETAL:  Trace LE edema, warm SKIN: Warm and dry NEUROLOGIC:  Alert and oriented x 3 PSYCHIATRIC:  Normal affect   ASSESSMENT:    1. NICM (nonischemic cardiomyopathy) ([place])   2. Other osteoarthritis involving multiple joints   3. Atypical atrial flutter ([place])   4. Hypokalemia    PLAN:    In order of problems listed above:  #Non-ischemic cardiomyopathy: #Chronic systolic heart  failure with improved EF 20-->45-50% TTE with LVEF <20% in the setting of Aflutter with RVR with suspected tachycardiac induced CM. Clean coronaries on cath.  Repeat TTE on 05/06/20 with LVEF 45-50%, G1DD. Patient currently appears compensated on exam with no HF symptoms. Tolerating medications without issues.  -TTE with improved LVEF 45-50% from 20-25% -No obstructive CAD on cath -RHC 02/09/20 with RAP 6, RV 30/6, PA 35/18, PCWP 10-15 consistent with normal filling pressures -Continue torsemide 20mg  daily -Continue lisionpril 10mg  daily -Start metoprolol 12.5mg  XL -Low Na diet -Repeat TTE in 3 months  #Aflutter/Flib: CHADs-vasc  3. S/p TEE with successful DCCV on 41/32/44 complicated by hypotension which resolved by the next day. Remains in rhythm today. Tolerating apixaban without issues. -Continue amiodarone 200mg  daily -Start Metoprolol 12.5mg  XL -Continue apixaban 5mg  BID  #Hypokalemia: -Repeat BMET today  #HLD: -Continue lipitor 40mg  daily  #Chronic Pain: -Follow-up with pain management clinic  #Right Intertrochanteric Femur Fracture: S/p cephalomedullary nailing with Dr. Doreatha Martin on 05/05/20.  -Arrange for follow-up with ortho       Medication Adjustments/Labs and Tests Ordered: Current medicines are reviewed at length with the patient today.  Concerns regarding medicines are outlined above.  Orders Placed This Encounter  Procedures  . Basic Metabolic Panel (BMET)  . Ambulatory referral to Orthopedic Surgery  . ECHOCARDIOGRAM COMPLETE   Meds ordered this encounter  Medications  . metoprolol succinate (TOPROL XL) 25 MG 24 hr tablet    Sig: Take 0.5 tablets (12.5 mg total) by mouth daily.    Dispense:  45 tablet    Refill:  3    Patient Instructions  Medication Instructions:  Your physician has recommended you make the following change in your medication: Start metoprolol succinate 12.5 mg by mouth daily  *If you need a refill on your cardiac medications  before your next appointment, please call your pharmacy*   Lab Work: Lab work to be done today--BMP If you have labs (blood work) drawn today and your tests are completely normal, you will receive your results only by: Marland Kitchen MyChart Message (if you have MyChart) OR . A paper copy in the mail If you have any lab test that is abnormal or we need to change your treatment, we will call you to review the results.   Testing/Procedures: Your physician has requested that you have an echocardiogram. Echocardiography is a painless test that uses sound waves to create images of your heart. It provides your doctor with information about the size and shape of your heart and how well your heart's chambers and valves are working. This procedure takes approximately one hour. There are no restrictions for this procedure. To be done in 3 months     Follow-Up: At [facility], you and your health needs are our priority.  As part of our continuing mission to provide you with exceptional heart care, we have created designated Provider Care Teams.  These Care Teams include your primary Cardiologist (physician) and Advanced Practice Providers (APPs -  Physician Assistants and Nurse Practitioners) who all work together to provide you with the care you need, when you need it.  We recommend signing up for the patient portal called "MyChart".  Sign up information is provided on this After Visit Summary.  MyChart is used to connect with patients for Virtual Visits (Telemedicine).  Patients are able to view lab/test results, encounter notes, upcoming appointments, etc.  Non-urgent messages can be sent to your provider as well.   To learn more about what you can do with MyChart, go to [email or web address].    Your next appointment:   3 month(s)--after echocardiogram  The format for your next appointment:   In Person  Provider:   You may see Freada Bergeron, MD or one of the following Advanced Practice  Providers on your designated Care Team:    Richardson Dopp, PA-C  Robbie Lis, [place]    Other Instructions You have been referred to Dr Doreatha Martin (orthopedics) for follow up      Signed, Freada Bergeron, MD  05/25/2020 10:26 AM    [facility] 

## 2020-05-24 DIAGNOSIS — S72141D Displaced intertrochanteric fracture of right femur, subsequent encounter for closed fracture with routine healing: Secondary | ICD-10-CM | POA: Diagnosis not present

## 2020-05-24 NOTE — Telephone Encounter (Signed)
Spoke with him.  Pain management will not give him extra pain medication  Has appointment with ortho on tomorrow   Has appointment with cardiology 2/1  Has appointment with Dr Posey Pronto on 2/2  Recommend need to get app with rheumatology

## 2020-05-25 ENCOUNTER — Encounter: Payer: Self-pay | Admitting: Cardiology

## 2020-05-25 ENCOUNTER — Other Ambulatory Visit: Payer: Self-pay

## 2020-05-25 ENCOUNTER — Telehealth: Payer: Self-pay | Admitting: *Deleted

## 2020-05-25 ENCOUNTER — Ambulatory Visit (INDEPENDENT_AMBULATORY_CARE_PROVIDER_SITE_OTHER): Payer: Medicare Other | Admitting: Cardiology

## 2020-05-25 VITALS — BP 130/90 | HR 78 | Ht >= 80 in | Wt 202.4 lb

## 2020-05-25 DIAGNOSIS — E876 Hypokalemia: Secondary | ICD-10-CM | POA: Diagnosis not present

## 2020-05-25 DIAGNOSIS — M158 Other polyosteoarthritis: Secondary | ICD-10-CM | POA: Diagnosis not present

## 2020-05-25 DIAGNOSIS — I484 Atypical atrial flutter: Secondary | ICD-10-CM | POA: Diagnosis not present

## 2020-05-25 DIAGNOSIS — I428 Other cardiomyopathies: Secondary | ICD-10-CM

## 2020-05-25 LAB — BASIC METABOLIC PANEL
BUN/Creatinine Ratio: 31 — ABNORMAL HIGH (ref 10–24)
BUN: 23 mg/dL (ref 8–27)
CO2: 30 mmol/L — ABNORMAL HIGH (ref 20–29)
Calcium: 9.5 mg/dL (ref 8.6–10.2)
Chloride: 98 mmol/L (ref 96–106)
Creatinine, Ser: 0.75 mg/dL — ABNORMAL LOW (ref 0.76–1.27)
GFR calc Af Amer: 110 mL/min/{1.73_m2} (ref >59)
GFR calc non Af Amer: 95 mL/min/{1.73_m2} (ref >59)
Glucose: 91 mg/dL (ref 65–99)
Potassium: 4.4 mmol/L (ref 3.5–5.2)
Sodium: 140 mmol/L (ref 134–144)

## 2020-05-25 MED ORDER — METOPROLOL SUCCINATE ER 25 MG PO TB24: 12.5000 mg | ORAL_TABLET | Freq: Every day | ORAL | 3 refills | Status: DC

## 2020-05-25 NOTE — Telephone Encounter (Signed)
Received message from operator that patient was calling back regarding his medications from office visit earlier today. I spoke with patient who is asking if Dr Johney Frame will prescribe pain medication for recent hip fracture.  I told patient Dr Johney Frame would not prescribe pain medications and asked him to follow up with his orthopedic surgeon.

## 2020-05-25 NOTE — Patient Instructions (Signed)
Medication Instructions:  Your physician has recommended you make the following change in your medication: Start metoprolol succinate 12.5 mg by mouth daily  *If you need a refill on your cardiac medications before your next appointment, please call your pharmacy*   Lab Work: Lab work to be done today--BMP If you have labs (blood work) drawn today and your tests are completely normal, you will receive your results only by: Marland Kitchen MyChart Message (if you have MyChart) OR . A paper copy in the mail If you have any lab test that is abnormal or we need to change your treatment, we will call you to review the results.   Testing/Procedures: Your physician has requested that you have an echocardiogram. Echocardiography is a painless test that uses sound waves to create images of your heart. It provides your doctor with information about the size and shape of your heart and how well your heart's chambers and valves are working. This procedure takes approximately one hour. There are no restrictions for this procedure. To be done in 3 months     Follow-Up: At [facility], you and your health needs are our priority.  As part of our continuing mission to provide you with exceptional heart care, we have created designated Provider Care Teams.  These Care Teams include your primary Cardiologist (physician) and Advanced Practice Providers (APPs -  Physician Assistants and Nurse Practitioners) who all work together to provide you with the care you need, when you need it.  We recommend signing up for the patient portal called "MyChart".  Sign up information is provided on this After Visit Summary.  MyChart is used to connect with patients for Virtual Visits (Telemedicine).  Patients are able to view lab/test results, encounter notes, upcoming appointments, etc.  Non-urgent messages can be sent to your provider as well.   To learn more about what you can do with MyChart, go to [email or web address].    Your  next appointment:   3 month(s)--after echocardiogram  The format for your next appointment:   In Person  Provider:   You may see Freada Bergeron, MD or one of the following Advanced Practice Providers on your designated Care Team:    Richardson Dopp, PA-C  [place]in [place], [place]    Other Instructions You have been referred to Dr Doreatha Martin (orthopedics) for follow up

## 2020-05-26 ENCOUNTER — Encounter: Payer: Self-pay | Admitting: Podiatry

## 2020-05-26 ENCOUNTER — Telehealth: Payer: Self-pay

## 2020-05-26 ENCOUNTER — Other Ambulatory Visit (HOSPITAL_COMMUNITY): Payer: Self-pay | Admitting: Student

## 2020-05-26 ENCOUNTER — Ambulatory Visit (INDEPENDENT_AMBULATORY_CARE_PROVIDER_SITE_OTHER): Payer: Medicare Other | Admitting: Podiatry

## 2020-05-26 DIAGNOSIS — Z89411 Acquired absence of right great toe: Secondary | ICD-10-CM

## 2020-05-26 DIAGNOSIS — L97522 Non-pressure chronic ulcer of other part of left foot with fat layer exposed: Secondary | ICD-10-CM

## 2020-05-26 NOTE — Telephone Encounter (Signed)
The patient has been notified of the result and verbalized understanding.  All questions (if any) were answered. Wilma Flavin, RN 05/26/2020 8:27 AM

## 2020-05-26 NOTE — Telephone Encounter (Signed)
Received VM from [place], Home health RN with [facility] regarding patient. Informing that patient would be discharged from home health nursing. However, Thomas [name] wanted provider to be aware of the following concerns.   -Patient is continuing to have unmanaged pain, due to lapse in pain medication. Patient has been receiving all pain medications through preferred pain management, however has not received additional medications related to post surgical pain. Per wife, patient asked for weapon in the home earlier today due to frustration with pain.   However, at time of visit patient was not currently having SI, or thoughts of self harm.    Called and spoke with [name] and provided with the following resources. She also reports that patient is currently not having any thoughts of self harm or SI at this time.   If you are feeling suicidal or depression symptoms worsen please immediately go to:   [place]  [address] [place], [place] [facility] [phone]    . If you are thinking about harming yourself or having thoughts of suicide, or if you know someone who is, seek help right away. . Call your doctor or mental health care provider. . Call 911 or go to a hospital emergency room to get immediate help, or ask a friend or family member to help you do these things. . Call the [place] National Suicide Prevention Lifeline's toll-free, 24-hour hotline at 1-800-273-TALK (916)030-7825) or TTY: 1-800-799-4 TTY (340) 014-0390) to talk to a trained counselor. . If you are in crisis, make sure you are not left alone.  . If someone else is in crisis, make sure he or she is not left alone   [facility] [employer] (Domestic Violence, Rape & Victim Assistance [phone]  [facility]    (ONLY from 8am-4pm)    [phone]  [facility] Mobile [facility] (24/7)    8155031233  [place] National Suicide Hotline   705-160-5181 (TALK)  Talbot Grumbling, RN

## 2020-05-26 NOTE — Telephone Encounter (Signed)
Patient's wife calls nurse line regarding concerns for Mr. Riedlinger. Reports that he is still having a lot of uncontrolled pain and that he has been dismissed from pain management program. Patient is unable to give reason for dismissal. Upon further evaluation, wife reports that patient has been having disorientation, jaw tightness and slurring of speech. Wife states he has been having issues with basic motor functions for the last day.   Scheduled patient follow up appointment with PCP on 2/16, however, stressed the importance of urgent evaluation due to symptoms. Patient is adamantly refusing to go to the ED for further evaluation due to long wait times. Informed patient and wife of risks associated with not being evaluated in the ED. Verbalized understanding and wife states she will keep a close eye on him.   Talbot Grumbling, RN

## 2020-05-26 NOTE — Telephone Encounter (Signed)
-----   Message from Freada Bergeron, MD sent at 05/25/2020  7:09 PM EST ----- Cr is stable and electrolytes look great. No changes in medications needed.

## 2020-05-27 ENCOUNTER — Encounter: Payer: Self-pay | Admitting: Podiatry

## 2020-05-27 MED FILL — METHOCARBAMOL 500 MG TABS: 500 | 10 days supply | Qty: 30 | Fill #0

## 2020-05-27 NOTE — Progress Notes (Signed)
Subjective:  Patient ID: Thomas [name], male    DOB: 1950/08/30,  MRN: [id number]  Chief Complaint  Patient presents with  . Routine Post Op    DOS: 03/26/2020 Procedure: Left foot incision and drainage with removal of all nonviable tissue second and fifth metatarsal and right third digit amputation  "The right foot is doing fine"  . Foot Ulcer    Follow up ulcer lateral 5th met left   "It looks the same to me" Patient states he fell x 1 week ago and fractured his hip    DOS: 03/26/2020 Procedure: Left foot incision and drainage with removal of all nonviable tissue second and fifth metatarsal and right third digit amputation  70 y.o. male returns for post-op check.  Patient presents with follow-up of to her undergoing a listed above procedure.  Patient states he is doing well.  He has some pain.  He has been ambulating with bilateral surgical shoes.  He denies any other acute complaints.  Review of Systems: Negative except as noted in the HPI. Denies N/V/F/Ch.  Past Medical History:  Diagnosis Date  . Atrial flutter ([place]) 01/2020  . Cancer [facility])    prostate  . Claustrophobia    quite severe  . Heart failure with reduced ejection fraction ([place])   . Hypertension   . Hypoglycemia    occ  . Left knee DJD    Xray 12/23/08  . NICM (nonischemic cardiomyopathy) ([place]) 02/12/2020  . Prostate cancer [facility])     Current Outpatient Medications:  .  amiodarone (PACERONE) 200 MG tablet, Take 1 tablet (200 mg total) by mouth daily., Disp: 90 tablet, Rfl: 3 .  apixaban (ELIQUIS) 5 MG TABS tablet, Take 1 tablet (5 mg total) by mouth 2 (two) times daily., Disp: 60 tablet, Rfl: 6 .  atorvastatin (LIPITOR) 40 MG tablet, Take 1 tablet (40 mg total) by mouth daily., Disp: 90 tablet, Rfl: 3 .  cholecalciferol (VITAMIN D) 25 MCG tablet, Take 1 tablet (1,000 Units total) by mouth daily., Disp: 30 tablet, Rfl: 0 .  diazepam (VALIUM) 5 MG tablet, Take 5 mg by mouth at bedtime., Disp: , Rfl:  .   DULoxetine (CYMBALTA) 60 MG capsule, Take 1 capsule (60 mg total) by mouth daily., Disp: 90 capsule, Rfl: 1 .  lisinopril (ZESTRIL) 10 MG tablet, Take 1 tablet (10 mg total) by mouth daily., Disp: 90 tablet, Rfl: 3 .  Melatonin 10 MG TABS, Take 10 mg by mouth at bedtime., Disp: , Rfl:  .  methocarbamol (ROBAXIN) 500 MG tablet, Take 1 tablet (500 mg total) by mouth 3 (three) times daily as needed for muscle spasms., Disp: 42 tablet, Rfl: 0 .  metoprolol succinate (TOPROL XL) 25 MG 24 hr tablet, Take 0.5 tablets (12.5 mg total) by mouth daily., Disp: 45 tablet, Rfl: 3 .  NARCAN 4 MG/0.1ML LIQD nasal spray kit, Place 1 spray into the nose as needed (as directed for emergency)., Disp: 2 each, Rfl: 0 .  oxyCODONE-acetaminophen (PERCOCET) 10-325 MG tablet, TAKE 1 TABLET BY MOUTH EVERY FOUR HOURS AS NEEDED FOR SEVERE PAIN, Disp: , Rfl:  .  potassium chloride SA (KLOR-CON M20) 20 MEQ tablet, Take 2 tablets (40 mEq total) by mouth daily., Disp: 180 tablet, Rfl: 3 .  predniSONE (DELTASONE) 10 MG tablet, Take 10 mg by mouth daily with breakfast. , Disp: , Rfl:  .  torsemide (DEMADEX) 20 MG tablet, Take 1 tablet (20 mg total) by mouth daily., Disp: 90 tablet, Rfl: 3  Social History   Tobacco Use  Smoking Status Never Smoker  Smokeless Tobacco Never Used    Allergies  Allergen Reactions  . Diltiazem Hcl Swelling  . Chlorthalidone Other (See Comments)    "Makes me light-headed and I don't like the way it makes me feel"  . Voltaren [Diclofenac] Rash   Objective:  There were no vitals filed for this visit. There is no height or weight on file to calculate BMI. Constitutional Well developed. Well nourished.  Vascular Foot warm and well perfused. Capillary refill normal to all digits.   Neurologic Normal speech. Oriented to person, place, and time. Epicritic sensation to light touch grossly present bilaterally.  Dermatologic Skin healing well without signs of infection. Skin edges well coapted  without signs of infection.  Orthopedic:  No tenderness to palpation noted about the surgical site.   Radiographs: 3 views of skeletally mature adult bilateral foot amputation noted to the right third digit.  No osteomyelitis noted.  No other bony abnormalities noted.  No new changes from previous x-ray noted.  Assessment:   1. Foot ulcer with fat layer exposed, left ([place])   2. Status post amputation of great toe, right ([place])    Plan:  Patient was evaluated and treated and all questions answered.  S/p foot surgery bilaterally -Clinically completely healed.  Patient has transition to regular shoes on the right side.    Left lateral fifth metatarsal head ulceration with fat layer exposed -Debridement as below. -Dressed with Neosporin and Band-Aid, DSD. -Continue off-loading with surgical shoe.  Procedure: Excisional Debridement of Wound~stagnant Tool: Sharp chisel blade/tissue nipper Rationale: Removal of non-viable soft tissue from the wound to promote healing.  Anesthesia: none Pre-Debridement Wound Measurements: 0.6 cm x 0.4 cm x 0.3 cm  Post-Debridement Wound Measurements: 0.7 cm x 0.4 cm x 0.3 cm  Type of Debridement: Sharp Excisional Tissue Removed: Non-viable soft tissue Blood loss: Minimal (<50cc) Depth of Debridement: subcutaneous tissue. Technique: Sharp excisional debridement to bleeding, viable wound base.  Wound Progress: The wound appears to be more stagnant now. Dressing: Dry, sterile, compression dressing. Disposition: Patient tolerated procedure well. Patient to return in 1 week for follow-up.  No follow-ups on file.     No follow-ups on file.

## 2020-05-27 NOTE — Telephone Encounter (Signed)
Called  Left VM to call our office

## 2020-05-28 MED FILL — OXYCODONE-APAP 10-325: 10-325 | 7 days supply | Qty: 42 | Fill #0

## 2020-05-31 ENCOUNTER — Other Ambulatory Visit: Payer: Self-pay | Admitting: Podiatry

## 2020-05-31 NOTE — Telephone Encounter (Signed)
Please advise 

## 2020-06-01 ENCOUNTER — Other Ambulatory Visit (HOSPITAL_COMMUNITY): Payer: Self-pay | Admitting: Student

## 2020-06-03 ENCOUNTER — Ambulatory Visit: Payer: Medicare Other | Admitting: Cardiology

## 2020-06-04 MED FILL — METHOCARBAMOL 500 MG TABS: 500 | 10 days supply | Qty: 30 | Fill #0

## 2020-06-04 MED FILL — OXYCODONE-APAP 10-325: 10-325 | 7 days supply | Qty: 42 | Fill #0

## 2020-06-08 ENCOUNTER — Other Ambulatory Visit (HOSPITAL_COMMUNITY): Payer: Self-pay | Admitting: Student

## 2020-06-09 ENCOUNTER — Other Ambulatory Visit: Payer: Self-pay

## 2020-06-09 ENCOUNTER — Encounter: Payer: Self-pay | Admitting: Family Medicine

## 2020-06-09 ENCOUNTER — Ambulatory Visit (INDEPENDENT_AMBULATORY_CARE_PROVIDER_SITE_OTHER): Payer: Medicare Other | Admitting: Family Medicine

## 2020-06-09 DIAGNOSIS — R195 Other fecal abnormalities: Secondary | ICD-10-CM | POA: Diagnosis not present

## 2020-06-09 DIAGNOSIS — M818 Other osteoporosis without current pathological fracture: Secondary | ICD-10-CM | POA: Diagnosis not present

## 2020-06-09 DIAGNOSIS — R634 Abnormal weight loss: Secondary | ICD-10-CM

## 2020-06-09 NOTE — Progress Notes (Signed)
    SUBJECTIVE:   CHIEF COMPLAINT / HPI:   HIP FRACTURE Following up with Dr Doreatha Martin who is writing his Oxycodone.  Walking with cane.  Feels he is progressing  CHF Following with Dr Johney Frame. Last echo showed marked improvement in ej fraction.  No edema or lightheadness  FOOT ULCER Improving Seeing Dr Posey Pronto.    Weight LOSS He feels he is eating well   CHRONIC PAIN Taking oxycodone about every 4 hours.  Feels he is still in significant pain.  Was discharged from pain center.  He understand ortho is unlikely to chronically prescribe narcotics  PERTINENT  PMH / PSH: selling some property that should help him retire   OBJECTIVE:   BP 120/72   Pulse 81   Wt 205 lb (93 kg)   SpO2 95%   BMI 22.52 kg/m   [place] with a cane R foot has small bruised area on top of foot.  No open lesion Heart - Regular rate and rhythm.  No murmurs, gallops or rubs.    Lungs:  Normal respiratory effort, chest expands symmetrically. Lungs are clear to auscultation, no crackles or wheezes. No pedal edema   ASSESSMENT/PLAN:   Weight loss, non-intentional Improving now his CHF and infection is under control   Heme positive stool Again reminded him he needs a colonoscopy  Osteoporosis May need medical treatment.  Consider BMD in future   CHRONIC PAIN Cautioned he needs to be preparing for weaning of his narcotics   [name], [place]

## 2020-06-09 NOTE — Assessment & Plan Note (Signed)
Again reminded him he needs a colonoscopy

## 2020-06-09 NOTE — Assessment & Plan Note (Signed)
Improving now his CHF and infection is under control

## 2020-06-09 NOTE — Assessment & Plan Note (Signed)
May need medical treatment.  Consider BMD in future

## 2020-06-09 NOTE — Patient Instructions (Signed)
Good to see you today!  Thanks for coming in.  So glad the heart is doing well  I would slowly wean down your narcotics - take the oxycodone every 6  Hours then every 8 hours   Follow up with Dr [name] to get back on your RA medications  Come back in 3 months

## 2020-06-11 ENCOUNTER — Other Ambulatory Visit (HOSPITAL_COMMUNITY): Payer: Self-pay | Admitting: Student

## 2020-06-11 MED FILL — OXYCODONE-APAP 10-325: 10-325 | 7 days supply | Qty: 42 | Fill #0

## 2020-06-14 MED FILL — METHOCARBAMOL 500 MG TABS: 500 | 10 days supply | Qty: 30 | Fill #0

## 2020-06-15 ENCOUNTER — Other Ambulatory Visit (HOSPITAL_COMMUNITY): Payer: Self-pay | Admitting: Student

## 2020-06-16 ENCOUNTER — Other Ambulatory Visit (HOSPITAL_COMMUNITY): Payer: Medicare Other

## 2020-06-17 MED FILL — OXYCODONE-APAP 10-325: 10-325 | 7 days supply | Qty: 42 | Fill #0

## 2020-06-21 ENCOUNTER — Other Ambulatory Visit (HOSPITAL_COMMUNITY): Payer: Self-pay | Admitting: Student

## 2020-06-21 DIAGNOSIS — S72141D Displaced intertrochanteric fracture of right femur, subsequent encounter for closed fracture with routine healing: Secondary | ICD-10-CM | POA: Diagnosis not present

## 2020-06-21 MED FILL — OXYCODONE-APAP 10-325: 10-325 | 7 days supply | Qty: 42 | Fill #0

## 2020-06-22 MED FILL — METHOCARBAMOL 500 MG TABS: 500 | 10 days supply | Qty: 30 | Fill #0

## 2020-06-23 ENCOUNTER — Ambulatory Visit: Payer: Medicare Other | Admitting: Podiatry

## 2020-06-24 ENCOUNTER — Other Ambulatory Visit (HOSPITAL_COMMUNITY): Payer: Self-pay | Admitting: Student

## 2020-06-25 MED FILL — OXYCODONE-APAP 10-325: 10-325 | 7 days supply | Qty: 42 | Fill #0

## 2020-06-28 DIAGNOSIS — M79604 Pain in right leg: Secondary | ICD-10-CM | POA: Diagnosis not present

## 2020-06-28 DIAGNOSIS — M150 Primary generalized (osteo)arthritis: Secondary | ICD-10-CM | POA: Diagnosis not present

## 2020-06-28 DIAGNOSIS — M0579 Rheumatoid arthritis with rheumatoid factor of multiple sites without organ or systems involvement: Secondary | ICD-10-CM | POA: Diagnosis not present

## 2020-06-28 DIAGNOSIS — C61 Malignant neoplasm of prostate: Secondary | ICD-10-CM | POA: Diagnosis not present

## 2020-06-30 ENCOUNTER — Other Ambulatory Visit (HOSPITAL_COMMUNITY): Payer: Self-pay | Admitting: Student

## 2020-07-05 DIAGNOSIS — M069 Rheumatoid arthritis, unspecified: Secondary | ICD-10-CM | POA: Diagnosis not present

## 2020-07-05 DIAGNOSIS — Z8781 Personal history of (healed) traumatic fracture: Secondary | ICD-10-CM | POA: Diagnosis not present

## 2020-07-05 DIAGNOSIS — M129 Arthropathy, unspecified: Secondary | ICD-10-CM | POA: Diagnosis not present

## 2020-07-05 DIAGNOSIS — Z9889 Other specified postprocedural states: Secondary | ICD-10-CM | POA: Diagnosis not present

## 2020-07-05 DIAGNOSIS — Z96653 Presence of artificial knee joint, bilateral: Secondary | ICD-10-CM | POA: Diagnosis not present

## 2020-07-12 ENCOUNTER — Other Ambulatory Visit (HOSPITAL_COMMUNITY): Payer: Self-pay | Admitting: Student

## 2020-07-12 MED FILL — METHOCARBAMOL 500 MG TABS: 500 | 10 days supply | Qty: 30 | Fill #0

## 2020-07-12 MED FILL — OXYCODONE-APAP 10-325: 10-325 | 7 days supply | Qty: 42 | Fill #0

## 2020-07-16 ENCOUNTER — Other Ambulatory Visit (HOSPITAL_COMMUNITY): Payer: Self-pay | Admitting: Student

## 2020-07-19 DIAGNOSIS — S72141D Displaced intertrochanteric fracture of right femur, subsequent encounter for closed fracture with routine healing: Secondary | ICD-10-CM | POA: Diagnosis not present

## 2020-07-19 MED FILL — OXYCODONE-APAP 10-325: 10-325 | 7 days supply | Qty: 42 | Fill #0

## 2020-07-22 DIAGNOSIS — M069 Rheumatoid arthritis, unspecified: Secondary | ICD-10-CM | POA: Diagnosis not present

## 2020-07-22 DIAGNOSIS — Z8781 Personal history of (healed) traumatic fracture: Secondary | ICD-10-CM | POA: Diagnosis not present

## 2020-07-22 DIAGNOSIS — Z79899 Other long term (current) drug therapy: Secondary | ICD-10-CM | POA: Diagnosis not present

## 2020-07-22 DIAGNOSIS — Z9889 Other specified postprocedural states: Secondary | ICD-10-CM | POA: Diagnosis not present

## 2020-07-22 DIAGNOSIS — Z96653 Presence of artificial knee joint, bilateral: Secondary | ICD-10-CM | POA: Diagnosis not present

## 2020-07-23 ENCOUNTER — Ambulatory Visit: Payer: Medicare Other | Admitting: Podiatry

## 2020-07-27 ENCOUNTER — Other Ambulatory Visit: Payer: Self-pay | Admitting: Student

## 2020-07-27 DIAGNOSIS — E876 Hypokalemia: Secondary | ICD-10-CM

## 2020-07-31 ENCOUNTER — Other Ambulatory Visit: Payer: Self-pay | Admitting: Podiatry

## 2020-08-19 ENCOUNTER — Other Ambulatory Visit: Payer: Self-pay | Admitting: Family Medicine

## 2020-08-20 DIAGNOSIS — M069 Rheumatoid arthritis, unspecified: Secondary | ICD-10-CM | POA: Diagnosis not present

## 2020-08-20 DIAGNOSIS — G894 Chronic pain syndrome: Secondary | ICD-10-CM | POA: Diagnosis not present

## 2020-08-20 DIAGNOSIS — Z8781 Personal history of (healed) traumatic fracture: Secondary | ICD-10-CM | POA: Diagnosis not present

## 2020-08-20 DIAGNOSIS — Z9889 Other specified postprocedural states: Secondary | ICD-10-CM | POA: Diagnosis not present

## 2020-08-24 DIAGNOSIS — Z8546 Personal history of malignant neoplasm of prostate: Secondary | ICD-10-CM | POA: Diagnosis not present

## 2020-08-27 ENCOUNTER — Other Ambulatory Visit: Payer: Self-pay

## 2020-08-27 ENCOUNTER — Ambulatory Visit (HOSPITAL_COMMUNITY): Payer: Medicare Other | Attending: Internal Medicine

## 2020-08-27 DIAGNOSIS — I428 Other cardiomyopathies: Secondary | ICD-10-CM | POA: Diagnosis not present

## 2020-08-27 LAB — ECHOCARDIOGRAM COMPLETE
Area-P 1/2: 2.92 cm2
S' Lateral: 3.4 cm

## 2020-08-29 NOTE — Progress Notes (Deleted)
Cardiology Office Note:    Date:  08/29/2020   ID:  Thomas [name], DOB [birth year], MRN [id number]  PCP:  Thomas Covert, MD  [facility] Cardiologist:  Thomas Bergeron, MD  [facility] Electrophysiologist:  None   Referring MD: Thomas [name], *   History of Present Illness:    Thomas [name] is a 70 y.o. male with a hx of of chronic systolic CHF/non-ischemic cardiomyopathy with EF of <20%, atrial flutter s/p DCCV in 01/2020 on Amiodarone and Eliquis, hypertension, recent osteomyelitis s/p multiple toe amputations, rheumatoid arthritis, prostate cancer, and anxiety/depression who presents to clinic for follow-up.  He was admitted from 02/02/2020 to 02/12/2020 for atrial flutter with RVR and newly diagnosed acute systolic CHF with gross volume overload. Echo showed LVEF of <20% with global hypokinesis, biatrial enlargement, mild MR, and moderate bilateral pleural effusions. RV mildly enlarged but systolic function normal. Patient was diuresed with IV Lasix and then underwent right/left cardiac catheterization on 02/09/2020 which showed minimal CAD, mildly reduced Fick cardiac output/index, and upper normal left/heart pressures. He then underwent successful TEE/DCCV on 02/11/2020. Following DCCV, he became somnolent and hypotensive requiring pressors but rapidly improved and these were able to be weaned off quickly. He was discharged on Torsemide 20mg  twice daily, Amiodarone 200mg  twice daily, and Eliquis 5mg  twice daily. Unfortunately BP limited our ability to add guideline directed medical therapy for CHF.  Patient was seen by Thomas Millin, NP, for follow-up on 03/31/2020. He reported having left foot surgery the week prior to this visit and had a PICC line placed for antibiotics. He was doing well from a cardiac standpoint. Amiodarone was decreased to 200mg  once daily and Torsemide was decreased to 20mg  once daily. Lisinopril 10mg  once daily was added.  Patient  presented to the ED on 05/05/2019 after a fall and was found to have a right intertrochanteric hip fractures. Cardiology consulted for pre-op evaluation. He underwent the procedure without complications. TTE on that admission with improved LVEF 45-50%.  Last seen in clinic by me in 05/2020 where he was doing well from a CV standpoint. We started him on metop at that time.  Past Medical History:  Diagnosis Date  . Atrial flutter ([place]) 01/2020  . Cancer [facility])    prostate  . Claustrophobia    quite severe  . Heart failure with reduced ejection fraction ([place])   . Hypertension   . Hypoglycemia    occ  . Left knee DJD    Xray 12/23/08  . NICM (nonischemic cardiomyopathy) ([place]) 02/12/2020  . Prostate cancer [facility])     Past Surgical History:  Procedure Laterality Date  . AMPUTATION TOE Right 12/2019  . AMPUTATION TOE Right 03/26/2020   Procedure: Right 3rd toe partial amputation, bone biopsy right 1st metatarsal;  Surgeon: Evelina Bucy, DPM;  Location: [facility];  Service: Podiatry;  Laterality: Right;  . BUBBLE STUDY  02/11/2020   Procedure: BUBBLE STUDY;  Surgeon: Geralynn Rile, MD;  Location: [place];  Service: Cardiovascular;;  . CARDIOVERSION N/A 02/11/2020   Procedure: CARDIOVERSION;  Surgeon: Geralynn Rile, MD;  Location: [place];  Service: Cardiovascular;  Laterality: N/A;  . CYSTOSCOPY  04/11/2018   Procedure: Erlene Quan;  Surgeon: [name], MD;  Location: [facility];  Service: Urology;;  NO SEEDS FOUND IN BLADDER  . HERNIA REPAIR  2009   inguinal  . INTRAMEDULLARY (IM) NAIL INTERTROCHANTERIC Right 05/05/2020   Procedure: CPT 27245-Cephalomedullary nailing of right intertrochanteric  femur fracture;  Surgeon: Shona Needles, MD;  Location: [place];  Service: Orthopedics;  Laterality: Right;  . IRRIGATION AND DEBRIDEMENT FOOT Left 03/26/2020   Procedure: Left foot incision and drainage with removal of all non-viable soft  tissue and bone - areas overlying the 2nd and 5th metatarsals.;  Surgeon: Evelina Bucy, DPM;  Location: [facility];  Service: Podiatry;  Laterality: Left;  . JOINT REPLACEMENT Bilateral 2017   knees  . RADIOACTIVE SEED IMPLANT N/A 04/11/2018   Procedure: RADIOACTIVE SEED IMPLANT/BRACHYTHERAPY IMPLANT;  Surgeon: [name], MD;  Location: [facility];  Service: Urology;  Laterality: N/A;   71     SEEDS IMPLANTED  . RIGHT/LEFT HEART CATH AND CORONARY ANGIOGRAPHY N/A 02/09/2020   Procedure: RIGHT/LEFT HEART CATH AND CORONARY ANGIOGRAPHY;  Surgeon: Nelva Bush, MD;  Location: [place] [facility];  Service: Cardiovascular;  Laterality: N/A;  . SPACE OAR INSTILLATION N/A 04/11/2018   Procedure: SPACE OAR INSTILLATION;  Surgeon: [name], MD;  Location[facility];  Service: Urology;  Laterality: N/A;  . TEE WITHOUT CARDIOVERSION N/A 02/11/2020   Procedure: TRANSESOPHAGEAL ECHOCARDIOGRAM (TEE);  Surgeon: Geralynn Rile, MD;  Location: [place];  Service: Cardiovascular;  Laterality: N/A;  . TOTAL KNEE ARTHROPLASTY Bilateral 09/13/2015   Procedure: BILATERAL KNEE ARTHROPLASTY ;  Surgeon: Paralee Cancel, MD;  Location: [facility];  Service: Orthopedics;  Laterality: Bilateral;    Current Medications: No outpatient medications have been marked as taking for the 08/31/20 encounter (Appointment) with Thomas Bergeron, MD.     Allergies:   Diltiazem hcl, Chlorthalidone, and Voltaren [diclofenac]   Social History   Socioeconomic History  . Marital status: Married    Spouse name: Not on file  . Number of children: Not on file  . Years of education: Not on file  . Highest education level: Not on file  Occupational History  . Not on file  Tobacco Use  . Smoking status: Never Smoker  . Smokeless tobacco: Never Used  Vaping Use  . Vaping Use: Never used  Substance and Sexual Activity  . Alcohol use: No    Alcohol/week: 0.0 standard  drinks  . Drug use: Not Currently  . Sexual activity: Yes  Other Topics Concern  . Not on file  Social History Narrative  . Not on file   Social Determinants of Health   Financial Resource Strain: Not on file  Food Insecurity: Not on file  Transportation Needs: Not on file  Physical Activity: Not on file  Stress: Not on file  Social Connections: Not on file     Family History: The patient's family history includes Prostate cancer in his brother and father. There is no history of Colon cancer, Rectal cancer, or Stomach cancer.  ROS:   Please see the history of present illness.    Review of Systems  Constitutional: Negative for chills and fever.  HENT: Negative for nosebleeds.   Eyes: Negative for blurred vision.  Respiratory: Negative for shortness of breath.   Cardiovascular: Positive for leg swelling. Negative for chest pain, palpitations, orthopnea, claudication and PND.  Gastrointestinal: Negative for blood in stool and melena.  Genitourinary: Negative for hematuria.  Musculoskeletal: Positive for falls, joint pain and myalgias.  Neurological: Negative for dizziness and loss of consciousness.  Endo/Heme/Allergies: Negative for polydipsia.  Psychiatric/Behavioral: Negative for memory loss.    EKGs/Labs/Other Studies Reviewed:    The following studies were reviewed today: Echocardiogram 05/06/20: IMPRESSIONS  1. Left ventricular  ejection fraction, by estimation, is 45 to 50%. The  left ventricle has mildly decreased function. The left ventricle  demonstrates global hypokinesis. Left ventricular diastolic parameters are  consistent with Grade I diastolic  dysfunction (impaired relaxation).  2. Right ventricular systolic function is normal. The right ventricular  size is normal. There is normal pulmonary artery systolic pressure. The  estimated right ventricular systolic pressure is 08.6 mmHg.  3. The mitral valve is normal in structure. No evidence of mitral valve   regurgitation.  4. The aortic valve was not well visualized. Aortic valve regurgitation  is not visualized. No aortic stenosis is present.  5. The inferior vena cava is dilated in size with >50% respiratory  variability, suggesting right atrial pressure of 8 mmHg.   Echocardiogram 02/02/2020: Impression: 1. While there is no visualized LV thrombus on sweeps of the apex,  patient did not have IV access, unable to use echo contrast for  confirmation. Left ventricular ejection fraction, by estimation, is <20%.  The left ventricle has severely decreased  function. The left ventricle demonstrates global hypokinesis. The left  ventricular internal cavity size was moderately dilated. Left ventricular  diastolic function could not be evaluated.  2. Right ventricular systolic function is normal. The right ventricular  size is mildly enlarged. There is mildly elevated pulmonary artery  systolic pressure.  3. Left atrial size was severely dilated.  4. Right atrial size was severely dilated.  5. Moderate pleural effusion in both left and right lateral regions.  6. The mitral valve is normal in structure. Mild mitral valve  regurgitation.  7. The aortic valve is tricuspid. There is mild calcification of the  aortic valve. Aortic valve regurgitation is not visualized. Mild aortic  valve sclerosis is present, with no evidence of aortic valve stenosis.  8. The inferior vena cava is dilated in size with <50% respiratory  variability, suggesting right atrial pressure of 15 mmHg.   Conclusion(s)/Recommendation(s): Patient in atrial flutter with RVR during  study. Discussed severely reduced EF with Dr. Gasper Sells, cardiologist  on call.  _______________  Right/Left Cardiac Catheterization 02/09/2020: Conclusions: 1. No angiographically significant coronary artery disease. 2. Upper normal left heart, right heart, and pulmonary artery pressures. 3. Mildly reduced Fick cardiac  output/index.  Recommendations: 1. Restart heparin infusion 2 hours after TR band removal. If there is no evidence of bleeding/vascular complication, consider transitioning back to apixaban as soon as tomorrow. 2. Proceed with TEE/cardioversion as soon as tomorrow. I will make Mr. Schnitzer NPO after midnight in anticipation of this. 3. Optimize evidence-based heart failure therapy for non-ischemic cardiomyopathy. 4. Primary prevention of coronary artery disease.  Diagnostic Dominance: Right      Recent Labs: 12/25/2019: BNP 901.9 02/12/2020: Magnesium 2.0 03/26/2020: ALT 15 03/31/2020: TSH 3.740 05/06/2020: Hemoglobin 12.3; Platelets 198 05/25/2020: BUN 23; Creatinine, Ser 0.75; Potassium 4.4; Sodium 140  Recent Lipid Panel    Component Value Date/Time   CHOL 111 02/04/2020 0452   TRIG 72 02/04/2020 0452   HDL 32 (L) 02/04/2020 0452   CHOLHDL 3.5 02/04/2020 0452   VLDL 14 02/04/2020 0452   LDLCALC 65 02/04/2020 0452   LDLDIRECT 94 12/06/2010 0917     Risk Assessment/Calculations:    CHA2DS2-VASc Score = 3  }This indicates a 3.2% annual risk of stroke. The patient's score is based upon: CHF History: Yes HTN History: Yes Diabetes History: No Stroke History: No Vascular Disease History: No Age Score: 1 Gender Score: 0    Physical Exam:  VS:  There were no vitals taken for this visit.    Wt Readings from Last 3 Encounters:  06/09/20 205 lb (93 kg)  05/25/20 202 lb 6.4 oz (91.8 kg)  05/05/20 209 lb (94.8 kg)     GEN:  Well nourished, well developed in no acute distress HEENT: Normal NECK: No JVD; No carotid bruits CARDIAC: RRR, no murmurs, rubs, gallops RESPIRATORY:  Clear to auscultation without rales, wheezing or rhonchi  ABDOMEN: Soft, non-tender, non-distended MUSCULOSKELETAL:  Trace LE edema, warm SKIN: Warm and dry NEUROLOGIC:  Alert and oriented x 3 PSYCHIATRIC:  Normal affect   ASSESSMENT:    No diagnosis found. PLAN:    In order of  problems listed above:  #Non-ischemic cardiomyopathy: #Chronic systolic heart failure with improved EF 20-->45-50% TTE with LVEF <20% in the setting of Aflutter with RVR with suspected tachycardiac induced CM. Clean coronaries on cath.  Repeat TTE on 05/06/20 with LVEF 45-50%, G1DD. Patient currently appears compensated on exam with no HF symptoms. Tolerating medications without issues.  -TTE with improved LVEF 45-50% from 20-25% -No obstructive CAD on cath -RHC 02/09/20 with RAP 6, RV 30/6, PA 35/18, PCWP 10-15 consistent with normal filling pressures -Continue torsemide 20mg  daily -Continue lisionpril 10mg  daily -Continue metoprolol 12.5mg  XL -Low Na diet -Repeat TTE to reassess LVEF  #Aflutter/Flib: CHADs-vasc 3. S/p TEE with successful DCCV on 35/36/14 complicated by hypotension which resolved by the next day. Remains in rhythm today. Tolerating apixaban without issues. -Continue amiodarone 200mg  daily -Continue Metoprolol 12.5mg  XL -Continue apixaban 5mg  BID  #Hypokalemia: Resolved.  #HLD: -Continue lipitor 40mg  daily  #Chronic Pain: -Follow-up with pain management clinic  #Right Intertrochanteric Femur Fracture: S/p cephalomedullary nailing with Dr. Doreatha Martin on 05/05/20.  -Arrange for follow-up with ortho       Medication Adjustments/Labs and Tests Ordered: Current medicines are reviewed at length with the patient today.  Concerns regarding medicines are outlined above.  No orders of the defined types were placed in this encounter.  No orders of the defined types were placed in this encounter.   There are no Patient Instructions on file for this visit.   Signed, Thomas Bergeron, MD  08/29/2020 10:47 AM    [place]

## 2020-08-30 ENCOUNTER — Telehealth: Payer: Self-pay | Admitting: Cardiology

## 2020-08-30 DIAGNOSIS — N5201 Erectile dysfunction due to arterial insufficiency: Secondary | ICD-10-CM | POA: Diagnosis not present

## 2020-08-30 DIAGNOSIS — Z8546 Personal history of malignant neoplasm of prostate: Secondary | ICD-10-CM | POA: Diagnosis not present

## 2020-08-30 DIAGNOSIS — E291 Testicular hypofunction: Secondary | ICD-10-CM | POA: Diagnosis not present

## 2020-08-30 NOTE — Telephone Encounter (Signed)
Cleon Gustin, RN  08/30/2020 9:37 AM EDT      The patient has been notified of the result and verbalized understanding. All questions (if any) were answered. Cleon Gustin, RN 08/30/2020 9:37 AM    Cleon Gustin, RN  08/30/2020 9:08 AM EDT      Left message for patient to call back.   Freada Bergeron, MD  08/30/2020 7:44 AM EDT      His echo shows that his pumping function is back to normal! This is fantastic news and means that being in rhythm and taking his medications has significantly improved his heart's pumping function. We will go over his echo in detail at his appointment this week as well.

## 2020-08-30 NOTE — Telephone Encounter (Signed)
Patient returning call for echo results. 

## 2020-08-31 ENCOUNTER — Ambulatory Visit: Payer: Medicare Other | Admitting: Cardiology

## 2020-09-07 DIAGNOSIS — S72141D Displaced intertrochanteric fracture of right femur, subsequent encounter for closed fracture with routine healing: Secondary | ICD-10-CM | POA: Diagnosis not present

## 2020-09-17 DIAGNOSIS — M069 Rheumatoid arthritis, unspecified: Secondary | ICD-10-CM | POA: Diagnosis not present

## 2020-09-17 DIAGNOSIS — Z79899 Other long term (current) drug therapy: Secondary | ICD-10-CM | POA: Diagnosis not present

## 2020-09-17 DIAGNOSIS — G894 Chronic pain syndrome: Secondary | ICD-10-CM | POA: Diagnosis not present

## 2020-09-17 DIAGNOSIS — Z8781 Personal history of (healed) traumatic fracture: Secondary | ICD-10-CM | POA: Diagnosis not present

## 2020-09-17 DIAGNOSIS — Z9889 Other specified postprocedural states: Secondary | ICD-10-CM | POA: Diagnosis not present

## 2020-09-20 ENCOUNTER — Other Ambulatory Visit: Payer: Self-pay | Admitting: Podiatry

## 2020-09-29 DIAGNOSIS — M79604 Pain in right leg: Secondary | ICD-10-CM | POA: Diagnosis not present

## 2020-09-29 DIAGNOSIS — M0579 Rheumatoid arthritis with rheumatoid factor of multiple sites without organ or systems involvement: Secondary | ICD-10-CM | POA: Diagnosis not present

## 2020-09-29 DIAGNOSIS — C61 Malignant neoplasm of prostate: Secondary | ICD-10-CM | POA: Diagnosis not present

## 2020-09-29 DIAGNOSIS — M150 Primary generalized (osteo)arthritis: Secondary | ICD-10-CM | POA: Diagnosis not present

## 2020-09-30 ENCOUNTER — Other Ambulatory Visit: Payer: Self-pay | Admitting: Family Medicine

## 2020-10-01 DIAGNOSIS — M069 Rheumatoid arthritis, unspecified: Secondary | ICD-10-CM | POA: Diagnosis not present

## 2020-10-01 DIAGNOSIS — Z96653 Presence of artificial knee joint, bilateral: Secondary | ICD-10-CM | POA: Diagnosis not present

## 2020-10-01 DIAGNOSIS — Z9889 Other specified postprocedural states: Secondary | ICD-10-CM | POA: Diagnosis not present

## 2020-10-01 DIAGNOSIS — G894 Chronic pain syndrome: Secondary | ICD-10-CM | POA: Diagnosis not present

## 2020-10-01 DIAGNOSIS — Z79899 Other long term (current) drug therapy: Secondary | ICD-10-CM | POA: Diagnosis not present

## 2020-10-12 ENCOUNTER — Ambulatory Visit (INDEPENDENT_AMBULATORY_CARE_PROVIDER_SITE_OTHER): Payer: Medicare Other | Admitting: Student in an Organized Health Care Education/Training Program

## 2020-10-12 ENCOUNTER — Other Ambulatory Visit: Payer: Self-pay

## 2020-10-12 ENCOUNTER — Encounter: Payer: Self-pay | Admitting: Student in an Organized Health Care Education/Training Program

## 2020-10-12 ENCOUNTER — Ambulatory Visit (INDEPENDENT_AMBULATORY_CARE_PROVIDER_SITE_OTHER): Payer: Medicare Other

## 2020-10-12 ENCOUNTER — Other Ambulatory Visit: Payer: Self-pay | Admitting: Student

## 2020-10-12 VITALS — BP 138/80 | HR 69 | Wt 221.0 lb

## 2020-10-12 DIAGNOSIS — Z23 Encounter for immunization: Secondary | ICD-10-CM | POA: Diagnosis not present

## 2020-10-12 DIAGNOSIS — K4090 Unilateral inguinal hernia, without obstruction or gangrene, not specified as recurrent: Secondary | ICD-10-CM

## 2020-10-12 DIAGNOSIS — M25559 Pain in unspecified hip: Secondary | ICD-10-CM

## 2020-10-12 NOTE — Patient Instructions (Signed)
General: NAD, pleasant, able to participate in exam Cardiac: RRR, normal heart sounds, no murmurs. 2+ radial and PT pulses bilaterally Respiratory: CTAB, normal effort, No wheezes, rales or rhonchi Abdomen: soft, nontender, nondistended, no hepatic or splenomegaly, +BS Extremities: no edema. WWP. Skin: warm and dry, no rashes noted Neuro: alert and oriented, no focal deficits Psych: Normal affect and mood

## 2020-10-12 NOTE — Progress Notes (Signed)
    SUBJECTIVE:   CHIEF COMPLAINT / HPI: groin swelling  Covid booster due  Swelling in R groin- about 1 week ago on right side. Previously had inguinal hernia on left side 10-12 years ago which was surgically repaired with mesh. 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 Noticed it while showering. Not particularly painful at baseline.  No changes in urinary or bowel habits.  Once per day Bms with occasional straining.  The knot is able to push down.  Has not noticed it getting bigger with pressures.   OBJECTIVE:   BP 138/80   Pulse 69   Wt 221 lb (100.2 kg)   SpO2 97%   BMI 24.28 kg/m   Physical Exam Vitals and nursing note reviewed. Exam conducted with a chaperone present.  Constitutional:      General: He is not in acute distress.    Appearance: He is normal weight. He is not ill-appearing or toxic-appearing.  Pulmonary:     Effort: Pulmonary effort is normal.  Abdominal:     Hernia: A hernia is present. Hernia is present in the right inguinal area. There is no hernia in the left inguinal area.  Genitourinary:    Comments: Positive for swelling at right inguinal canal which is reducible. Mildly tender with pressure. No swelling in scrotum. No skin changes. Neurological:     General: No focal deficit present.     Mental Status: He is alert.  Psychiatric:        Mood and Affect: Mood normal.        Behavior: Behavior normal.   ASSESSMENT/PLAN:   Inguinal hernia Remote h/o left inguinal hernia open repair with mesh. No concerns with this side. New onset r inguinal hernia which is easily reduced without signs/symptoms of incarceration/gangrene.  General surgery referral today as well as emergent symptoms precautions given Recommended to avoid strenuous activity until repaired and avoid straining with BMs.      [place]

## 2020-10-16 ENCOUNTER — Ambulatory Visit
Admission: RE | Admit: 2020-10-16 | Discharge: 2020-10-16 | Disposition: A | Discharge status: Home / Self Care | Payer: Medicare Other | Source: Ambulatory Visit | Attending: Student | Admitting: Student

## 2020-10-16 ENCOUNTER — Other Ambulatory Visit: Payer: Self-pay

## 2020-10-16 DIAGNOSIS — M1611 Unilateral primary osteoarthritis, right hip: Secondary | ICD-10-CM | POA: Diagnosis not present

## 2020-10-16 DIAGNOSIS — M25559 Pain in unspecified hip: Secondary | ICD-10-CM

## 2020-10-16 IMAGING — CT CT HIP*R* W/O CM
1 series · 15 of 32 positions shown, 19 images · non-contrast
Comparison: Right hip radiographs [DATE]

CLINICAL DATA: Right hip pain, recent fall 4 weeks ago

EXAM:
CT OF THE RIGHT HIP WITHOUT CONTRAST
TECHNIQUE: Multidetector CT imaging of the right hip was performed according to
the standard protocol. Multiplanar CT image reconstructions were
also generated.

[Series 3: soft tissue pelvis/hip · axial · 0.46mm/px · z∈[+582,+900]mm · 15 of 118 slices shown, 19 images]
[im 8/118  soft-tissue]
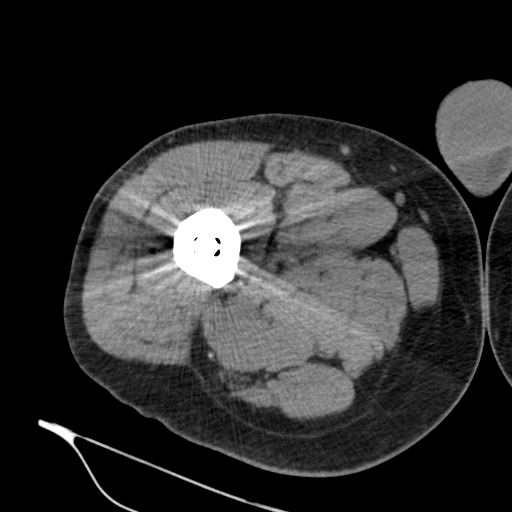
[im 8/118  bone]
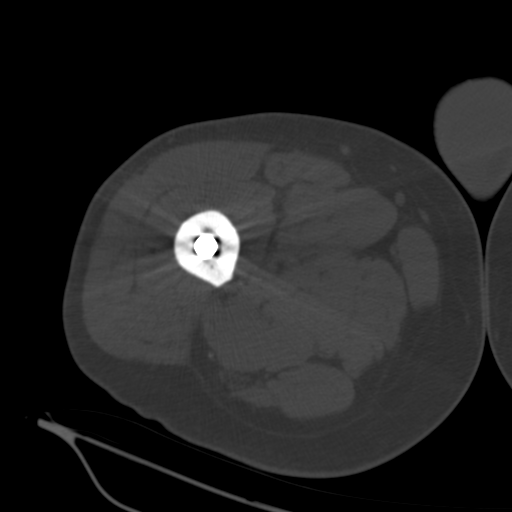
[im 16/118  soft-tissue]
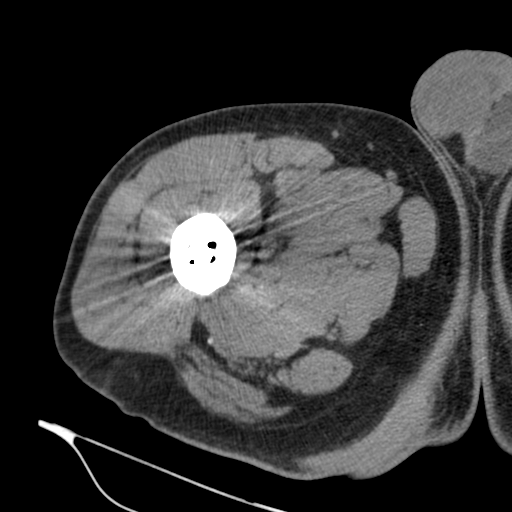
[im 23/118  soft-tissue]
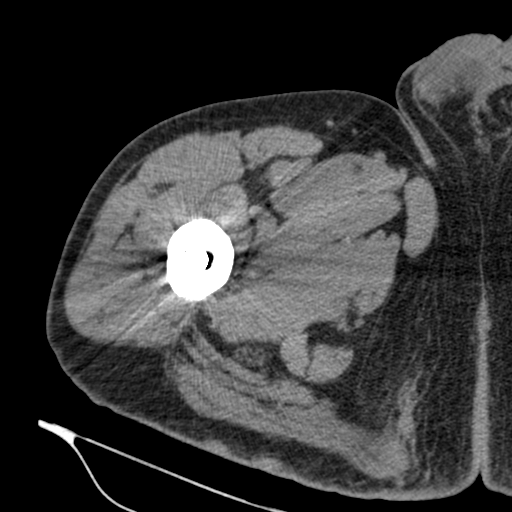
[im 34/118  soft-tissue]
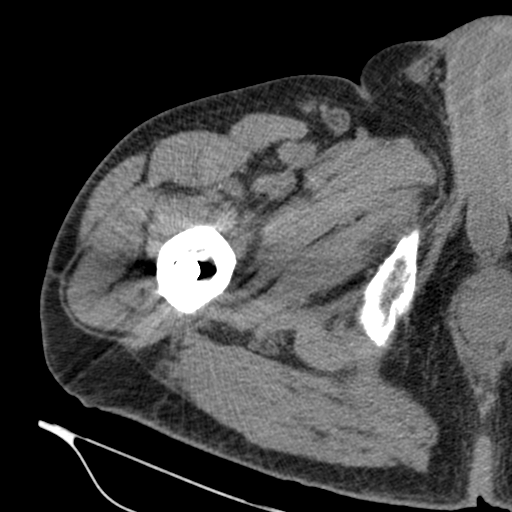
[im 42/118  soft-tissue]
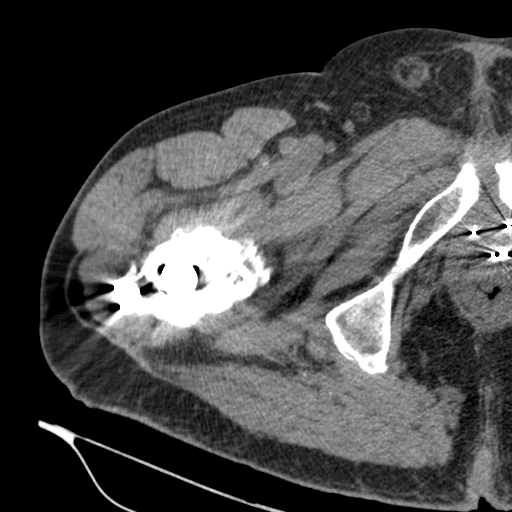
[im 50/118  soft-tissue]
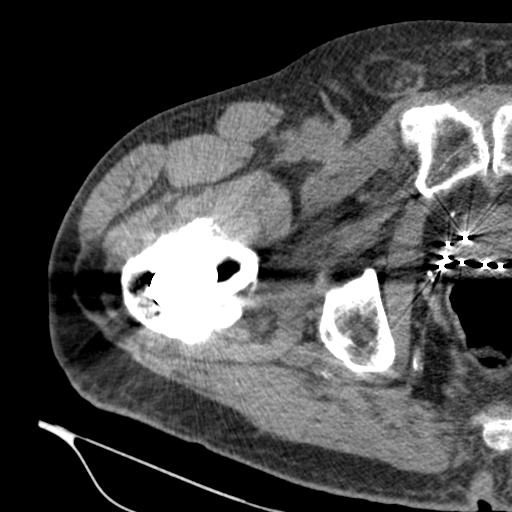
[im 61/118  soft-tissue]
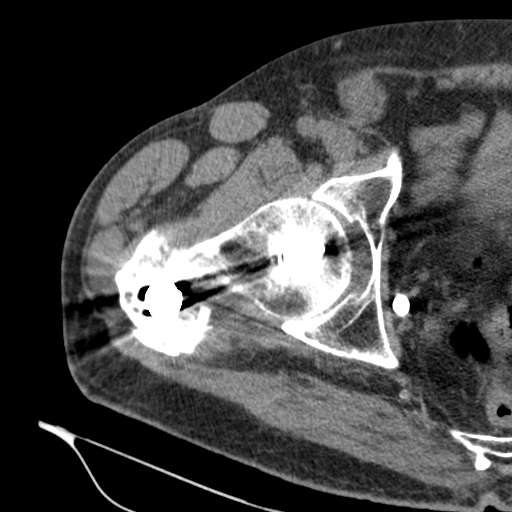
[im 68/118  soft-tissue]
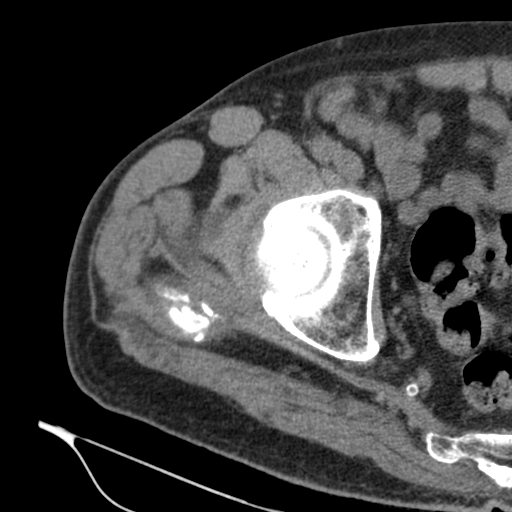
[im 76/118  soft-tissue]
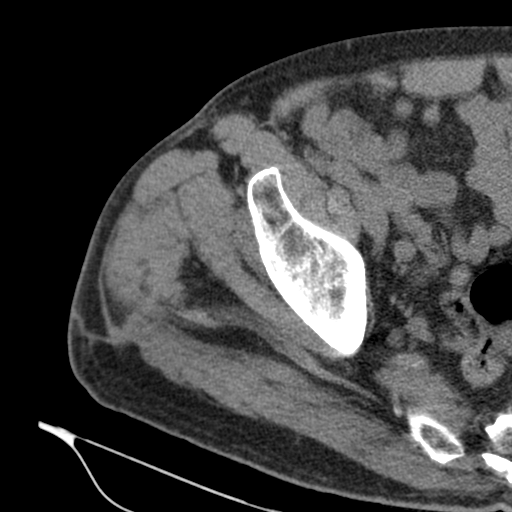
[im 76/118  bone]
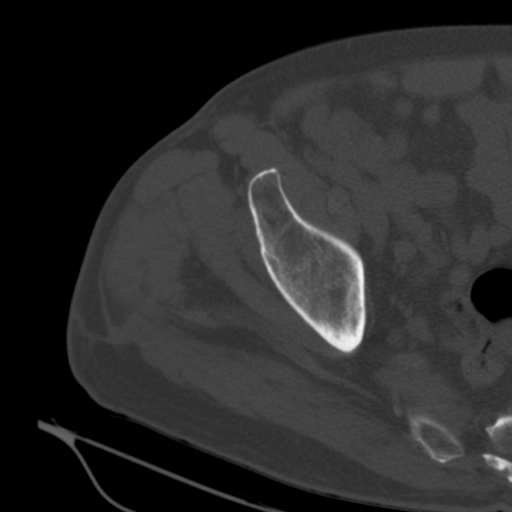
[im 84/118  soft-tissue]
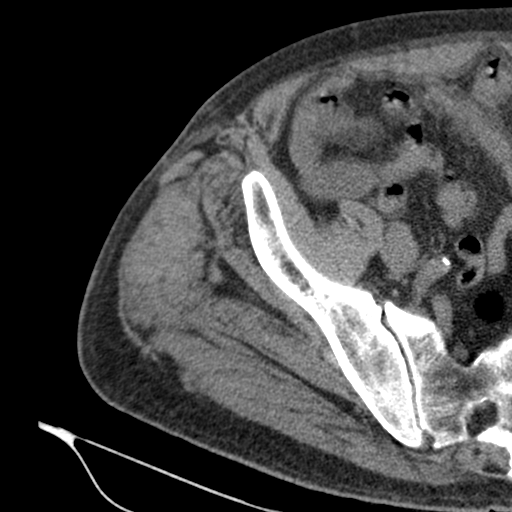
[im 95/118  soft-tissue]
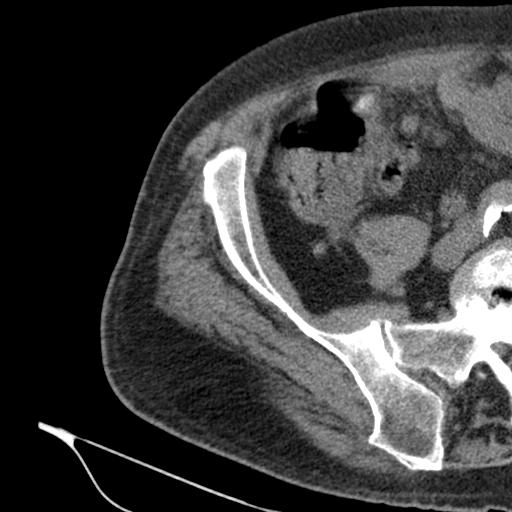
[im 102/118  soft-tissue]
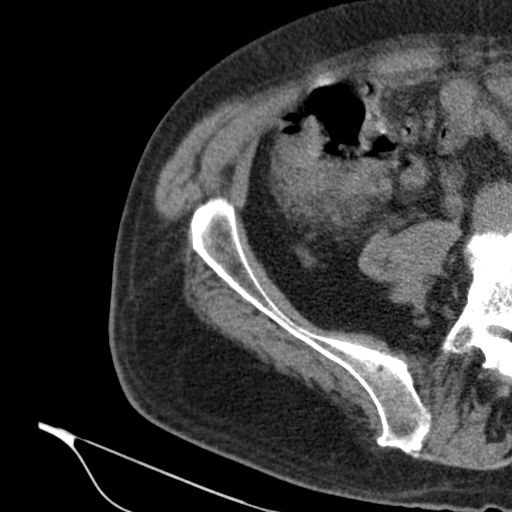
[im 102/118  lung]
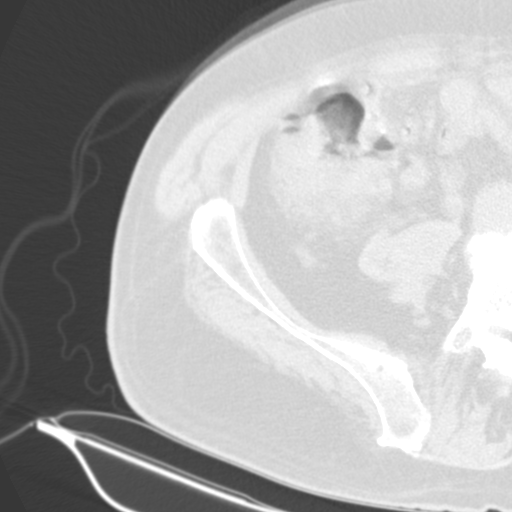
[im 106/118  lung]
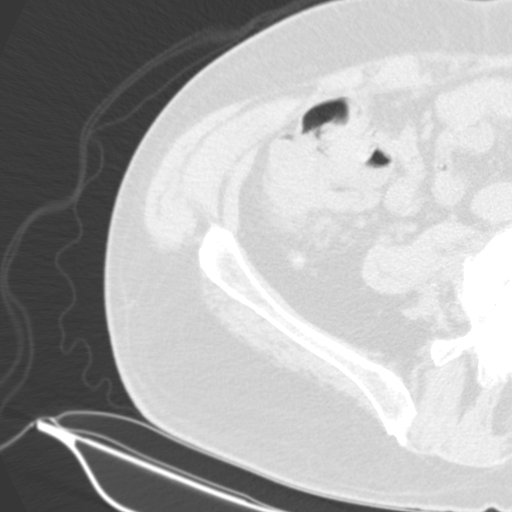
[im 110/118  soft-tissue]
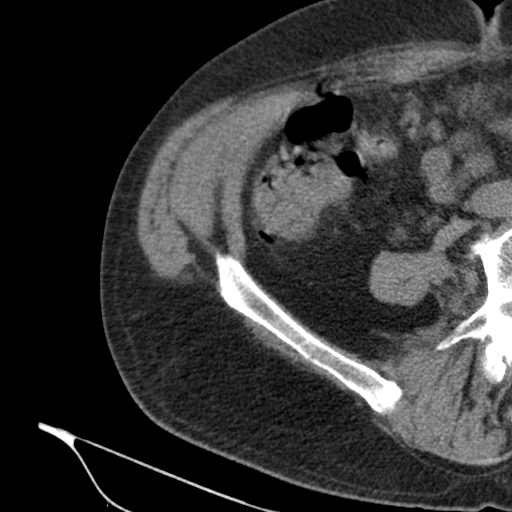
[im 110/118  lung]
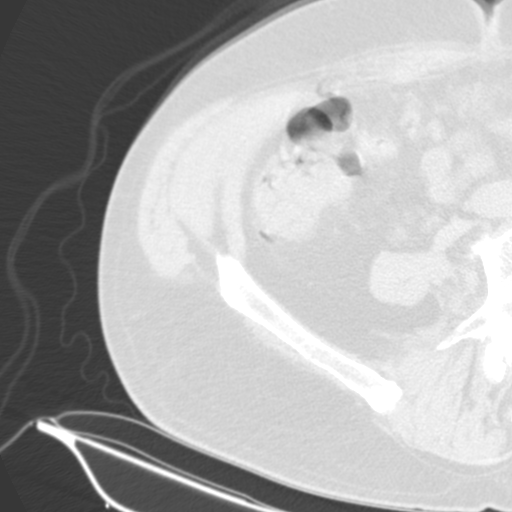
[im 114/118  lung]
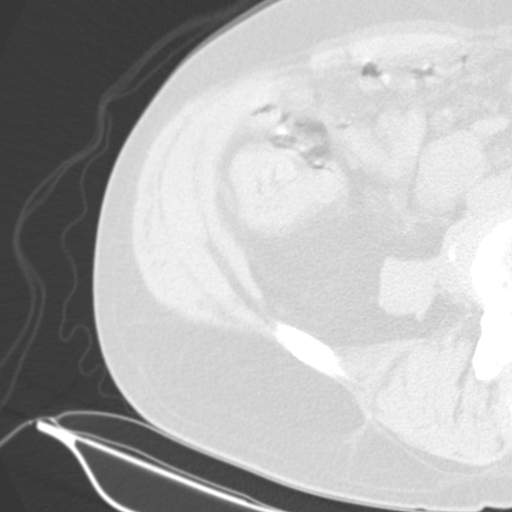

[15 of 32 positions shown; findings below may reference images not displayed]

FINDINGS: Bones/Joint/Cartilage

Intramedullary nail and dynamic screw fixation of the right femur
for an intertrochanteric fracture. Persistent intertrochanteric
fracture line with changes of healing and minimal bony bridging.

There is a new transverse fracture component not seen on prior
radiograph, and increased varus angulation in comparison to prior
degrees). The intramedullary nail appears to be fractured with
slight offset where the dynamic screw courses through it (coronal
bone image 43 and 48). There is lucency along the proximal segment
of the intramedullary nail in the greater trochanter but not along
the dynamic screw in the femoral neck or the segment in the femoral
shaft.

Mild right hip osteoarthritis. Partially visualized SI joint and
lower lumbar spine degenerative change.

Ligaments

Suboptimally assessed by CT.

Muscles and Tendons

Mild gluteus minimus atrophy.  Prostate brachytherapy seeds noted.

Soft tissues

There is a fat containing right inguinal hernia. Small bowel abuts
the hernia.
IMPRESSION: Prior intramedullary nail and dynamic screw fixation of the proximal
femur for an intertrochanteric fracture. Persistent
intertrochanteric fracture line with changes of healing but minimal
bony bridging.

New transverse fracture component not visualized on prior
radiograph, which appears subacute, with increased neck-shaft
angle/varus angulation. Fractured intramedullary nail with slight
offset where the dynamic screw courses through it. Mild lucency
surrounding the proximal intramedullary nail segment within the
greater trochanter, but no lucency along the dynamic nail in the
neck or distal aspect of the intramedullary nail in the femoral
shaft.

These results were called by telephone at the time of interpretation
on [DATE] at [DATE] to provider NEVELS , who verbally
acknowledged these results.

## 2020-10-18 ENCOUNTER — Telehealth: Payer: Self-pay | Admitting: Family Medicine

## 2020-10-18 DIAGNOSIS — K4090 Unilateral inguinal hernia, without obstruction or gangrene, not specified as recurrent: Secondary | ICD-10-CM | POA: Insufficient documentation

## 2020-10-18 NOTE — Assessment & Plan Note (Addendum)
Remote h/o left inguinal hernia open repair with mesh. No concerns with this side. New onset r inguinal hernia which is easily reduced without signs/symptoms of incarceration/gangrene.  General surgery referral today as well as emergent symptoms precautions given Recommended to avoid strenuous activity until repaired and avoid straining with BMs.

## 2020-10-18 NOTE — Telephone Encounter (Signed)
Patient is calling concerning the referral placed to [facility] surgery for his hernia. When they called him to schedule they were not able to schedule him until August. He would like to know if there could be a referral sent somewhere else. He said with his job he does not believe he can wait that long.   I explained a lot of surgeons have wait list. Patients was understanding but said if possible he would still like to try another office.   The best call back for patient with any questions is [phone].

## 2020-10-19 DIAGNOSIS — M25551 Pain in right hip: Secondary | ICD-10-CM | POA: Diagnosis not present

## 2020-10-19 DIAGNOSIS — M069 Rheumatoid arthritis, unspecified: Secondary | ICD-10-CM | POA: Diagnosis not present

## 2020-10-19 DIAGNOSIS — R892 Abnormal level of other drugs, medicaments and biological substances in specimens from other organs, systems and tissues: Secondary | ICD-10-CM | POA: Diagnosis not present

## 2020-10-19 DIAGNOSIS — Z79899 Other long term (current) drug therapy: Secondary | ICD-10-CM | POA: Diagnosis not present

## 2020-11-01 DIAGNOSIS — S72141D Displaced intertrochanteric fracture of right femur, subsequent encounter for closed fracture with routine healing: Secondary | ICD-10-CM | POA: Diagnosis not present

## 2020-11-01 NOTE — Telephone Encounter (Signed)
Patient calling back regarding this hernia and getting surgery for it. He had expected Dr. Erin Hearing to have called him by now. Please call patient to discuss this.   Did offer patient an appt to discuss with Dr. Erin Hearing in person, but patient declined since it was going to be in August and he's hoping to have surgery to remove the hernia before then.

## 2020-11-01 NOTE — Telephone Encounter (Signed)
Patient calls nurse line checking the status of referral. Patient given direction to call [facility] where referral has been placed. Phone number given to patient.

## 2020-11-01 NOTE — Telephone Encounter (Signed)
Received a voicemail from patient regarding his referral and concern for amount of time it was taking to get his appointment made.  I called [facility] [place] [facility] and they do have the office notes and referral from [place] and they are being reviewed.  When I called patient back to let him know, he was under the impression that they hadn't even received the referral yet.  I updated him on what I knew and that they will call him to schedule a consult/procedure.  Patient is concerned because he has noticed an increase in size.   He would like to speak with Dr. Erin Hearing to update him on his condition and I told him I would call [facility] and try to get him an appointment. Clara Smolen,CMA

## 2020-11-02 NOTE — Telephone Encounter (Signed)
Spoke w him.  Slowly enlarging.  No pain or sticking.   Suggested he contact [facility] directly and get on a waiting list  If not working could consider referral to another surgery group

## 2020-11-04 NOTE — Telephone Encounter (Signed)
Patient calls nurse line requesting to speak with PCP. Patient reports he is scheduled for surgery, however not until the end of July. Patient reports sharp pains to the area and reports an increase in size. Please advise.

## 2020-11-04 NOTE — Telephone Encounter (Signed)
Spoke with him Hernia is painful when wearing truss.   The size of a baseball.  It goes back in and is not painful when he lies down.   He has appointment with surgery  Advised needs to follow up with surgery And to go to the ER if becomes very painful and does not reduce He agrees

## 2020-11-05 DIAGNOSIS — M25551 Pain in right hip: Secondary | ICD-10-CM | POA: Diagnosis not present

## 2020-11-05 DIAGNOSIS — Z79899 Other long term (current) drug therapy: Secondary | ICD-10-CM | POA: Diagnosis not present

## 2020-11-05 DIAGNOSIS — M069 Rheumatoid arthritis, unspecified: Secondary | ICD-10-CM | POA: Diagnosis not present

## 2020-11-05 DIAGNOSIS — Z9114 Patient's other noncompliance with medication regimen: Secondary | ICD-10-CM | POA: Diagnosis not present

## 2020-11-05 DIAGNOSIS — R892 Abnormal level of other drugs, medicaments and biological substances in specimens from other organs, systems and tissues: Secondary | ICD-10-CM | POA: Diagnosis not present

## 2020-11-16 DIAGNOSIS — F1120 Opioid dependence, uncomplicated: Secondary | ICD-10-CM | POA: Diagnosis not present

## 2020-11-16 DIAGNOSIS — Z9889 Other specified postprocedural states: Secondary | ICD-10-CM | POA: Diagnosis not present

## 2020-11-16 DIAGNOSIS — G894 Chronic pain syndrome: Secondary | ICD-10-CM | POA: Diagnosis not present

## 2020-11-16 DIAGNOSIS — Z9114 Patient's other noncompliance with medication regimen: Secondary | ICD-10-CM | POA: Diagnosis not present

## 2020-11-17 DIAGNOSIS — K4090 Unilateral inguinal hernia, without obstruction or gangrene, not specified as recurrent: Secondary | ICD-10-CM | POA: Diagnosis not present

## 2020-11-29 ENCOUNTER — Other Ambulatory Visit: Payer: Self-pay

## 2020-11-29 ENCOUNTER — Encounter (HOSPITAL_COMMUNITY)
Admission: RE | Admit: 2020-11-29 | Discharge: 2020-11-29 | Disposition: A | Discharge status: Home / Self Care | Payer: Medicare Other | Source: Ambulatory Visit | Attending: Surgery | Admitting: Surgery

## 2020-11-29 ENCOUNTER — Encounter (HOSPITAL_COMMUNITY): Payer: Self-pay

## 2020-11-29 DIAGNOSIS — Z01812 Encounter for preprocedural laboratory examination: Secondary | ICD-10-CM | POA: Diagnosis not present

## 2020-11-29 DIAGNOSIS — I509 Heart failure, unspecified: Secondary | ICD-10-CM | POA: Insufficient documentation

## 2020-11-29 DIAGNOSIS — I110 Hypertensive heart disease with heart failure: Secondary | ICD-10-CM | POA: Insufficient documentation

## 2020-11-29 DIAGNOSIS — Z7982 Long term (current) use of aspirin: Secondary | ICD-10-CM | POA: Diagnosis not present

## 2020-11-29 DIAGNOSIS — I4892 Unspecified atrial flutter: Secondary | ICD-10-CM | POA: Insufficient documentation

## 2020-11-29 DIAGNOSIS — Z79899 Other long term (current) drug therapy: Secondary | ICD-10-CM | POA: Diagnosis not present

## 2020-11-29 DIAGNOSIS — Z7901 Long term (current) use of anticoagulants: Secondary | ICD-10-CM | POA: Insufficient documentation

## 2020-11-29 DIAGNOSIS — K4090 Unilateral inguinal hernia, without obstruction or gangrene, not specified as recurrent: Secondary | ICD-10-CM | POA: Diagnosis not present

## 2020-11-29 HISTORY — DX: Depression, unspecified: F32.A

## 2020-11-29 HISTORY — DX: Rheumatoid arthritis, unspecified: M06.9

## 2020-11-29 HISTORY — DX: Angina pectoris, unspecified: I20.9

## 2020-11-29 LAB — BASIC METABOLIC PANEL
Anion gap: 8 (ref 5–15)
BUN: 15 mg/dL (ref 8–23)
CO2: 28 mmol/L (ref 22–32)
Calcium: 8.7 mg/dL — ABNORMAL LOW (ref 8.9–10.3)
Chloride: 104 mmol/L (ref 98–111)
Creatinine, Ser: 0.79 mg/dL (ref 0.61–1.24)
GFR, Estimated: >60 mL/min (ref >60)
Glucose, Bld: 103 mg/dL — ABNORMAL HIGH (ref 70–99)
Potassium: 3.6 mmol/L (ref 3.5–5.1)
Sodium: 140 mmol/L (ref 135–145)

## 2020-11-29 LAB — CBC
HCT: 36 % — ABNORMAL LOW (ref 39.0–52.0)
Hemoglobin: 11.1 g/dL — ABNORMAL LOW (ref 13.0–17.0)
MCH: 26.7 pg (ref 26.0–34.0)
MCHC: 30.8 g/dL (ref 30.0–36.0)
MCV: 86.5 fL (ref 80.0–100.0)
Platelets: 255 10*3/uL (ref 150–400)
RBC: 4.16 MIL/uL — ABNORMAL LOW (ref 4.22–5.81)
RDW: 14.5 % (ref 11.5–15.5)
WBC: 4.9 10*3/uL (ref 4.0–10.5)
nRBC: 0 % (ref 0.0–0.2)

## 2020-11-29 NOTE — Patient Instructions (Addendum)
DUE TO COVID-19 ONLY ONE VISITOR IS ALLOWED TO COME WITH YOU AND STAY IN THE WAITING ROOM ONLY DURING PRE OP AND PROCEDURE DAY OF SURGERY. THE 1 VISITOR  MAY VISIT WITH YOU AFTER SURGERY IN YOUR PRIVATE ROOM DURING VISITING HOURS ONLY!                Thomas [name]     Your procedure is scheduled on: 12/07/20   Report to [facility] Main  Entrance   Report to admitting at   10:15 AM     Call this number if you have problems the morning of surgery [phone]    Remember: Do not eat food after Midnight.  You may have clear liquids until 9:15 am   BRUSH YOUR TEETH MORNING OF SURGERY AND RINSE YOUR MOUTH OUT, NO CHEWING GUM CANDY OR MINTS.     Take these medicines the morning of surgery with A SIP OF WATER: Amiodarone, Metoprolol, Prednisone, Duloxetine,Buprenorphine, pain med if needed                                You may not have any metal on your body including              piercings  Do not wear jewelry, lotions, powders or deodorant                         Men may shave face and neck.   Do not bring valuables to the hospital. [place].  Contacts, dentures or bridgework may not be worn into surgery.       Patients discharged the day of surgery will not be allowed to drive home.   IF YOU ARE HAVING SURGERY AND GOING HOME THE SAME DAY, YOU MUST HAVE AN ADULT TO DRIVE YOU HOME AND BE WITH YOU FOR 24 HOURS.  YOU MAY GO HOME BY TAXI OR UBER OR ORTHERWISE, BUT AN ADULT MUST ACCOMPANY YOU HOME AND STAY WITH YOU FOR 24 HOURS.  Name and phone number of your driver:  Special Instructions: N/A              Please read over the following fact sheets you were given: _____________________________________________________________________             [facility] - Preparing for Surgery Before surgery, you can play an important role.  Because skin is not sterile, your skin needs to be as free of germs as possible.  You can  reduce the number of germs on your skin by washing with CHG (chlorahexidine gluconate) soap before surgery.  CHG is an antiseptic cleaner which kills germs and bonds with the skin to continue killing germs even after washing. Please DO NOT use if you have an allergy to CHG or antibacterial soaps.  If your skin becomes reddened/irritated stop using the CHG and inform your nurse when you arrive at Short Stay. .  You may shave your face/neck.  Please follow these instructions carefully:  1.  Shower with CHG Soap the night before surgery and the  morning of Surgery.  2.  If you choose to wash your hair, wash your hair first as usual with your  normal  shampoo.  3.  After you shampoo, rinse your hair and body thoroughly to remove  the  shampoo.                                        4.  Use CHG as you would any other liquid soap.  You can apply chg directly  to the skin and wash                       Gently with a scrungie or clean washcloth.  5.  Apply the CHG Soap to your body ONLY FROM THE NECK DOWN.   Do not use on face/ open                           Wound or open sores. Avoid contact with eyes, ears mouth and genitals (private parts).                       Wash face,  Genitals (private parts) with your normal soap.             6.  Wash thoroughly, paying special attention to the area where your surgery  will be performed.  7.  Thoroughly rinse your body with warm water from the neck down.  8.  DO NOT shower/wash with your normal soap after using and rinsing off  the CHG Soap.             9.  Pat yourself dry with a clean towel.            10.  Wear clean pajamas.            11.  Place clean sheets on your bed the night of your first shower and do not  sleep with pets. Day of Surgery : Do not apply any lotions/deodorants the morning of surgery.  Please wear clean clothes to the hospital/surgery center.  FAILURE TO FOLLOW THESE INSTRUCTIONS MAY RESULT IN THE CANCELLATION OF YOUR SURGERY PATIENT  SIGNATURE_________________________________  NURSE SIGNATURE__________________________________  ________________________________________________________________________

## 2020-11-29 NOTE — Progress Notes (Signed)
COVID Vaccine Completed:Yes Date COVID Vaccine completed:Pt had 2 shots and 2 boosters COVID vaccine manufacturer: Pfizer     PCP - Dr. Jerilynn Mages. [place]  LOV 05/25/20-epic Cardiologist - Dr. Shary Key  Chest x-ray - no EKG - 05/04/20-epic Stress Test - no ECHO - 08/27/20-epic Cardiac Cath - 02/09/20-epic Pacemaker/ICD device last checked:NA  Sleep Study - no CPAP -   Fasting Blood Sugar - NA Checks Blood Sugar _____ times a day  Blood Thinner Instructions:Eliquis / Dr. Johney Frame Aspirin Instructions:no instructions yet. Pt will call her today. Last Dose:  Anesthesia review: yes  Patient denies shortness of breath, fever, cough and chest pain at PAT appointment Pt has no SOB with activities. He has a bent rod in his hip that needs surgery and has RA but has not been able to take medications for that because he needs these surgeries. He has a buprenorphine patch and will see his pain Dr.11/30/20.He will ask at that appointment.He hasn't received any instructions for holding Eliquis and will call his Dr. Today.  Patient verbalized understanding of instructions that were given to them at the PAT appointment. Patient was also instructed that they will need to review over the PAT instructions again at home before surgery. yes

## 2020-11-30 ENCOUNTER — Telehealth: Payer: Self-pay

## 2020-11-30 DIAGNOSIS — M25551 Pain in right hip: Secondary | ICD-10-CM | POA: Diagnosis not present

## 2020-11-30 DIAGNOSIS — M069 Rheumatoid arthritis, unspecified: Secondary | ICD-10-CM | POA: Diagnosis not present

## 2020-11-30 DIAGNOSIS — S72141D Displaced intertrochanteric fracture of right femur, subsequent encounter for closed fracture with routine healing: Secondary | ICD-10-CM | POA: Diagnosis not present

## 2020-11-30 DIAGNOSIS — Z79899 Other long term (current) drug therapy: Secondary | ICD-10-CM | POA: Diagnosis not present

## 2020-11-30 DIAGNOSIS — R892 Abnormal level of other drugs, medicaments and biological substances in specimens from other organs, systems and tissues: Secondary | ICD-10-CM | POA: Diagnosis not present

## 2020-11-30 NOTE — Telephone Encounter (Signed)
   Name: Thomas [name]  DOB: 1951/02/19  MRN: [id number]   Primary Cardiologist: Freada Bergeron, MD  Chart reviewed as part of pre-operative protocol coverage. Patient was contacted 11/30/2020 in reference to pre-operative risk assessment for pending surgery as outlined below.  BERTICE HELMICH was last seen on 05/25/2020 by Dr. Johney Frame.  Since that day, BUZZY DETORO has done well without any exertional chest pain or worsening dyspnea.  See recommendation by our clinical pharmacist, they recommend to hold Eliquis for 2 days prior to the procedure and restart as soon as possible afterward at the surgeon's discretion.  Therefore, based on ACC/AHA guidelines, the patient would be at acceptable risk for the planned procedure without further cardiovascular testing.   The patient was advised that if he develops new symptoms prior to surgery to contact our office to arrange for a follow-up visit, and he verbalized understanding.  I will route this recommendation to the requesting party via Epic fax function and remove from pre-op pool. Please call with questions.  [place], [place] 11/30/2020, 7:14 PM

## 2020-11-30 NOTE — Telephone Encounter (Signed)
   [facility] Pre-operative Risk Assessment    Patient Name: Thomas [name]  DOB: Sep 06, 1950 MRN: [id number]  HEARTCARE STAFF:  - IMPORTANT!!!!!! Under Visit Info/Reason for Call, type in Other and utilize the format Clearance MM/DD/YY or Clearance TBD. Do not use dashes or single digits. - Please review there is not already an duplicate clearance open for this procedure. - If request is for dental extraction, please clarify the # of teeth to be extracted. - If the patient is currently at the dentist's office, call Pre-Op Callback Staff (MA/nurse) to input urgent request.  - If the patient is not currently in the dentist office, please route to the Pre-Op pool.  Request for surgical clearance:  What type of [name] is being performed? XI ROBOTIC ASSISTED RIGHT INGUINAL HERNIA  When is this [name] scheduled? 12-07-20  What type of clearance is required (medical clearance vs. Pharmacy clearance to hold med vs. Both)? BOTH  Are there any medications that need to be held prior to [name] and how long? [place] name and name of physician performing [name]? Thomas [name] Thomas Hausen, MD  ATTN:Thomas [name]  What is the office phone number? [phone]   7.   What is the office fax number? [phone]  8.   Anesthesia type (None, local, MAC, general) ? GENERAL   Thomas [name] 11/30/2020, 11:40 AM  _________________________________________________________________   (provider comments below)

## 2020-11-30 NOTE — Telephone Encounter (Signed)
Patient with diagnosis of aflutter on Eliquis for anticoagulation.    Procedure: XI ROBOTIC ASSISTED RIGHT INGUINAL HERNIA Date of procedure: 12/07/20  CHA2DS2-VASc Score = 3  This indicates a 3.2% annual risk of stroke. The patient's score is based upon: CHF History: Yes HTN History: Yes Diabetes History: No Stroke History: No Vascular Disease History: No Age Score: 1 Gender Score: 0      CrCl 102 ml/min Platelet count 255  Per office protocol, patient can hold Eliquis for 2 days prior to procedure.

## 2020-11-30 NOTE — Telephone Encounter (Signed)
Clinical pharmacist to review Eliquis 

## 2020-12-01 NOTE — Progress Notes (Signed)
Anesthesia Chart Review   Case: [id number] Date/Time: 12/07/20 1200   Procedure: XI ROBOTIC ASSISTED RIGHT INGUINAL HERNIA (Right)   Anesthesia type: General   Pre-op diagnosis: right inguinal hernia   Location: [facility] ROOM 02 / [facility]   Surgeons: Johnathan Hausen, MD       DISCUSSION:70 y.o. never smoker with h/o HTN, atrial flutter, RA, CHF, right inguinal hernia scheduled for above procedure 12/07/2020 with Dr. Johnathan Hausen.   Per cardiology preoperative evaluation 11/30/2020, "Chart reviewed as part of pre-operative protocol coverage. Patient was contacted 11/30/2020 in reference to pre-operative risk assessment for pending surgery as outlined below.  Thomas [name] was last seen on 05/25/2020 by Dr. Johney Frame.  Since that day, Thomas [name] has done well without any exertional chest pain or worsening dyspnea. See recommendation by our clinical pharmacist, they recommend to hold Eliquis for 2 days prior to the procedure and restart as soon as possible afterward at the surgeon's discretion. Therefore, based on ACC/AHA guidelines, the patient would be at acceptable risk for the planned procedure without further cardiovascular testing."  Anticipate pt can proceed with planned procedure barring acute status change.   VS: BP (!) 155/84   Pulse 68   Temp 36.8 C (Oral)   Resp 20   Ht '6\' 8"'  (2.032 m)   Wt 99.8 kg   SpO2 98%   BMI 24.17 kg/m   PROVIDERS: [name], MD is PCP   Gwyndolyn Kaufman, MD is Cardiologist  LABS: Labs reviewed: Acceptable for surgery. (all labs ordered are listed, but only abnormal results are displayed)  Labs Reviewed  BASIC METABOLIC PANEL - Abnormal; Notable for the following components:      Result Value   Glucose, Bld 103 (*)    Calcium 8.7 (*)    All other components within normal limits  CBC - Abnormal; Notable for the following components:   RBC 4.16 (*)    Hemoglobin 11.1 (*)    HCT 36.0 (*)    All other components within normal limits      IMAGES:   EKG: 05/04/20 Rate 65 bpm  Sinus or ectopic atrial rhythm Borderline prolonged PR interval Probable LVH with secondary repol abnrm No significant change since last tracing  CV: Echo 08/27/2020  1. Left ventricular ejection fraction, by estimation, is 55 to 60%. The  left ventricle has normal function. The left ventricle has no regional  wall motion abnormalities. There is mild left ventricular hypertrophy.  Left ventricular diastolic parameters  are consistent with Grade I diastolic dysfunction (impaired relaxation).   2. Right ventricular systolic function is normal. The right ventricular  size is normal. There is normal pulmonary artery systolic pressure.   3. Right atrial size was moderately dilated.   4. The mitral valve is grossly normal. Trivial mitral valve  regurgitation.   5. The aortic valve is tricuspid. Aortic valve regurgitation is trivial.  Mild aortic valve sclerosis is present, with no evidence of aortic valve  stenosis.   6. Aortic dilatation noted. There is mild dilatation of the aortic root,  measuring 41 mm.   7. The inferior vena cava is normal in size with greater than 50%  respiratory variability, suggesting right atrial pressure of 3 mmHg.  Cardiac Cath 02/09/2020 Conclusions: No angiographically significant coronary artery disease. Upper normal left heart, right heart, and pulmonary artery pressures. Mildly reduced Fick cardiac output/index.   Recommendations: Restart heparin infusion 2 hours after TR band removal.  If there is no evidence  of bleeding/vascular complication, consider transitioning back to apixaban as soon as tomorrow. Proceed with TEE/cardioversion as soon as tomorrow.  I will make Thomas [name] after midnight in anticipation of this. Optimize evidence-based heart failure therapy for non-ischemic cardiomyopathy. Primary prevention of coronary artery disease.   Past Medical History:  Diagnosis Date   Anginal pain  ([place])    Atrial flutter ([place]) 01/2020   Cancer ([place])    prostate   Claustrophobia    quite severe   Depression    Heart failure with reduced ejection fraction (HCC)    Hypertension    Hypoglycemia    occ   NICM (nonischemic cardiomyopathy) ([place]) 02/12/2020   Prostate cancer ([place]) 2019   Rheumatoid arthritis [facility])     Past Surgical History:  Procedure Laterality Date   AMPUTATION TOE Right 12/2019   AMPUTATION TOE Right 03/26/2020   Procedure: Right 3rd toe partial amputation, bone biopsy right 1st metatarsal;  Surgeon: Evelina Bucy, DPM;  [facility];  Service: Podiatry;  Laterality: Right;   BUBBLE STUDY  02/11/2020   Procedure: BUBBLE STUDY;  Surgeon: Geralynn Rile, MD;  Location: [place];  Service: Cardiovascular;;   CARDIOVERSION N/A 02/11/2020   Procedure: CARDIOVERSION;  Surgeon: Geralynn Rile, MD;  Location: [place];  Service: Cardiovascular;  Laterality: N/A;   CYSTOSCOPY  04/11/2018   Procedure: Erlene Quan;  Surgeon: [name], MD;  Location: [facility];  Service: Urology;;  NO SEEDS FOUND IN BLADDER   HERNIA REPAIR  2009   inguinal   INTRAMEDULLARY (IM) NAIL INTERTROCHANTERIC Right 05/05/2020   Procedure: CPT 27245-Cephalomedullary nailing of right intertrochanteric femur fracture;  Surgeon: Shona Needles, MD;  Location: [place];  Service: Orthopedics;  Laterality: Right;   IRRIGATION AND DEBRIDEMENT FOOT Left 03/26/2020   Procedure: Left foot incision and drainage with removal of all non-viable soft tissue and bone - areas overlying the 2nd and 5th metatarsals.;  Surgeon: Evelina Bucy, DPM;  [facility];  Service: Podiatry;  Laterality: Left;   RADIOACTIVE SEED IMPLANT N/A 04/11/2018   Procedure: RADIOACTIVE SEED IMPLANT/BRACHYTHERAPY IMPLANT;  Surgeon: [name], MD;  Location[facility];  Service: Urology;  Laterality: N/A;   68     SEEDS IMPLANTED   RIGHT/LEFT  HEART CATH AND CORONARY ANGIOGRAPHY N/A 02/09/2020   Procedure: RIGHT/LEFT HEART CATH AND CORONARY ANGIOGRAPHY;  Surgeon: Nelva Bush, MD;  Location: [place] CV [facility];  Service: Cardiovascular;  Laterality: N/A;   SPACE OAR INSTILLATION N/A 04/11/2018   Procedure: SPACE OAR INSTILLATION;  Surgeon: [name], MD;  Location: [facility];  Service: Urology;  Laterality: N/A;   TEE WITHOUT CARDIOVERSION N/A 02/11/2020   Procedure: TRANSESOPHAGEAL ECHOCARDIOGRAM (TEE);  Surgeon: Geralynn Rile, MD;  Location: [place];  Service: Cardiovascular;  Laterality: N/A;   TOTAL KNEE ARTHROPLASTY Bilateral 09/13/2015   Procedure: BILATERAL KNEE ARTHROPLASTY ;  Surgeon: Paralee Cancel, MD;  [facility];  Service: Orthopedics;  Laterality: Bilateral;    MEDICATIONS:  amiodarone (PACERONE) 200 MG tablet   apixaban (ELIQUIS) 5 MG TABS tablet   aspirin-acetaminophen-caffeine (EXCEDRIN MIGRAINE) 250-250-65 MG tablet   atorvastatin (LIPITOR) 40 MG tablet   buprenorphine (BUTRANS) 15 MCG/HR   diazepam (VALIUM) 5 MG tablet   DULoxetine (CYMBALTA) 60 MG capsule   ibuprofen (ADVIL) 800 MG tablet   KLOR-CON M20 20 MEQ tablet   lisinopril (ZESTRIL) 10 MG tablet   Melatonin 10 MG TABS  methocarbamol (ROBAXIN) 750 MG tablet   metoprolol succinate (TOPROL XL) 25 MG 24 hr tablet   NARCAN 4 MG/0.1ML LIQD nasal spray kit   oxyCODONE-acetaminophen (PERCOCET) 10-325 MG tablet   oxyCODONE-acetaminophen (PERCOCET) 10-325 MG tablet   oxyCODONE-acetaminophen (PERCOCET) 10-325 MG tablet   oxyCODONE-acetaminophen (PERCOCET) 10-325 MG tablet   oxyCODONE-acetaminophen (PERCOCET) 10-325 MG tablet   oxyCODONE-acetaminophen (PERCOCET) 10-325 MG tablet   predniSONE (DELTASONE) 5 MG tablet   testosterone cypionate (DEPOTESTOSTERONE CYPIONATE) 200 MG/ML injection   torsemide (DEMADEX) 20 MG tablet   Vitamin D, Ergocalciferol, (DRISDOL) 1.25 MG (50000 UNIT) CAPS capsule   Vitamin D3  (VITAMIN D) 25 MCG tablet   No current facility-administered medications for this encounter.    Konrad Felix, PA-C [facility] [phone]

## 2020-12-01 NOTE — Anesthesia Preprocedure Evaluation (Addendum)
Anesthesia Evaluation  Patient identified by MRN, date of birth, ID band Patient awake    Reviewed: Allergy & Precautions, NPO status , Patient's Chart, lab work & pertinent test results  History of Anesthesia Complications Negative for: history of anesthetic complications  Airway Mallampati: IV  TM Distance: >3 FB Neck ROM: Full  Mouth opening: Limited Mouth Opening  Dental  (+) Dental Advisory Given, Poor Dentition, Missing,    Pulmonary neg pulmonary ROS, neg shortness of breath, neg sleep apnea, neg COPD, neg recent URI,  Covid-19 Nucleic Acid Test Results Lab Results      Component                Value               Date                      SARSCOV2NAA              NEGATIVE            05/04/2020                [place]              NEGATIVE            03/25/2020                [place]              NEGATIVE            02/02/2020              breath sounds clear to auscultation       Cardiovascular hypertension, Pt. on medications and Pt. on home beta blockers (-) angina+CHF  (-) Past MI + dysrhythmias Atrial Fibrillation  Rhythm:Regular  Conclusions: 1. No angiographically significant coronary artery disease. 2. Upper normal left heart, right heart, and pulmonary artery pressures. 3. Mildly reduced Fick cardiac output/index.  Recommendations: 1. Restart heparin infusion 2 hours after TR band removal.  If there is no evidence of bleeding/vascular complication, consider transitioning back to apixaban as soon as tomorrow. 2. Proceed with TEE/cardioversion as soon as tomorrow.  I will make Thomas [name] NPO after midnight in anticipation of this. 3. Optimize evidence-based heart failure therapy for non-ischemic cardiomyopathy. 4. Primary prevention of coronary artery disease.   1. Left ventricular ejection fraction, by estimation, is 55 to 60%. The  left ventricle has normal function. The left ventricle has no regional   wall motion abnormalities. There is mild left ventricular hypertrophy.  Left ventricular diastolic parameters  are consistent with Grade I diastolic dysfunction (impaired relaxation).  2. Right ventricular systolic function is normal. The right ventricular  size is normal. There is normal pulmonary artery systolic pressure.  3. Right atrial size was moderately dilated.  4. The mitral valve is grossly normal. Trivial mitral valve  regurgitation.  5. The aortic valve is tricuspid. Aortic valve regurgitation is trivial.  Mild aortic valve sclerosis is present, with no evidence of aortic valve  stenosis.  6. Aortic dilatation noted. There is mild dilatation of the aortic root,  measuring 41 mm.  7. The inferior vena cava is normal in size with greater than 50%  respiratory variability, suggesting right atrial pressure of 3 mmHg.    Neuro/Psych PSYCHIATRIC DISORDERS Anxiety Depression negative neurological ROS     GI/Hepatic negative GI ROS, Neg liver ROS,   Endo/Other  negative endocrine ROS  Renal/GU negative Renal ROS     Musculoskeletal  (+) Arthritis ,   Abdominal   Peds  Hematology  (+) Blood dyscrasia, , eliquis Lab Results      Component                Value               Date                      WBC                      4.9                 11/29/2020                HGB                      11.1 (L)            11/29/2020                HCT                      36.0 (L)            11/29/2020                MCV                      86.5                11/29/2020                PLT                      255                 11/29/2020              Anesthesia Other Findings   Reproductive/Obstetrics                            Anesthesia Physical Anesthesia Plan  ASA: 2  Anesthesia Plan: General   Post-op Pain Management:    Induction: Intravenous  PONV Risk Score and Plan: 2 and Ondansetron and Dexamethasone  Airway Management  Planned: Video Laryngoscope Planned  Additional Equipment: None  Intra-op Plan:   Post-operative Plan: Extubation in OR  Informed Consent: I have reviewed the patients History and Physical, chart, labs and discussed the procedure including the risks, benefits and alternatives for the proposed anesthesia with the patient or authorized representative who has indicated his/her understanding and acceptance.     Dental advisory given  Plan Discussed with: CRNA and Anesthesiologist  Anesthesia Plan Comments: (See PAT note 11/29/2020, Konrad Felix, PA-C)       Anesthesia Quick Evaluation

## 2020-12-05 ENCOUNTER — Other Ambulatory Visit: Payer: Self-pay | Admitting: Family Medicine

## 2020-12-06 NOTE — H&P (Signed)
Chief Complaint: Hernia   History of Present Illness: Thomas [name] is a 70 y.o. male who is seen today as an office consultation at the request of Dr. None for evaluation of Hernia .   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 He is a thin lean muscular white male that hides a lot of underlying pain. He has rheumatoid arthritis for about 5 years and is under chronic main pain management for that. He took Enbrel shots for that for a long time but has been off those since he fell from a scaffolding in January and broke his right hip requiring emergency surgery by Dr. Sherle Poe. He subsequently fell off a scaffolding again and may and snapped the rod. He has been having a lot of pain with that and his pain med provider at [facility] refused to fill his prescription today so I have typed a letter requesting that they fill his prescription because he had increased his pain needs with this refractured hip. That occurrence was in May and then he developed this large bulging right inguinal hernia in June. It comes out during the day and goes back in at night but it is about the size of a tennis ball in his right groin. He had a prior open left inguinal hernia, many years ago.  He has had atrial flutter or fibrillation and required cardioversion. He is not on anticoagulants for that at the present time. They also attempted to use Cardizem and this gave him severe leg swelling resulting in some toe issues requiring amputation by the podiatrist.  I discussed robotic right inguinal hernia repair with him with a fallback of doing an open right inguinal hernia if that hernia proves to be too big. He would like to get that scheduled between mid and mid August before his vacation time which is scheduled between August 21 and 28. He is also working on a stone project with his son which hopefully will be through within a couple weeks.  Review  of Systems: See HPI as well for other ROS.  Review of Systems  All other systems reviewed and are negative.   Medical History: Past Medical History:  Diagnosis Date   Arthritis   History of atrial fibrillation  was converted   Hx of fracture of right hip   Rheumatoid arthritis (CMS-HCC)   Patient Active Problem List  Diagnosis   Acute osteomyelitis of toe, right (CMS-HCC)   Atrial flutter (CMS-HCC)   Chronic combined systolic and diastolic CHF, NYHA class 2 (CMS-HCC)   Chronic right hip pain   Hip fracture (CMS-HCC)   Hypertension   Inguinal hernia   Malignant neoplasm of prostate (CMS-HCC)   NICM (nonischemic cardiomyopathy) (CMS-HCC)   Status post bilateral knee replacements   Past Surgical History:  Procedure Laterality Date   HERNIA REPAIR   JOINT REPLACEMENT    Allergies  Allergen Reactions   Diltiazem Hcl Swelling   Chlorthalidone Other (See Comments)  "Makes me light-headed and I don't like the way it makes me feel"   Diclofenac Rash   Current Outpatient Medications on File Prior to Visit  Medication Sig Dispense Refill   cholecalciferol (VITAMIN D3) 1000 unit tablet TAKE 1 TABLET (1,000 UNITS TOTAL) BY MOUTH DAILY.   buprenorphine (BUTRANS) 10 mcg/hour 1 (ONE) PATCH ON SKIN WEEKLY   diazePAM (VALIUM) 5 MG tablet TAKE 1 TABLET RECTALLY AT BEDTIME AS NEEDED  DULoxetine (CYMBALTA) 60 MG DR capsule TAKE 1 CAPSULE BY MOUTH EVERY DAY - PLS MAKE AN APPOINTMENT FOR AN OFFICE VISIT   lisinopriL (ZESTRIL) 10 MG tablet Take 10 mg by mouth once daily   methocarbamoL (ROBAXIN) 750 MG tablet TAKE 1 TABLET BY MOUTH FOUR TIMES A DAY AS NEEDED   TORsemide (DEMADEX) 20 MG tablet   No current facility-administered medications on file prior to visit.   History reviewed. No pertinent family history.   Social History   Tobacco Use  Smoking Status Never Smoker  Smokeless Tobacco Never Used    Social History   Socioeconomic History   Marital status: Unknown   Tobacco Use   Smoking status: Never Smoker   Smokeless tobacco: Never Used  Scientific laboratory technician Use: Never used  Substance and Sexual Activity   Alcohol use: Yes  Alcohol/week: 1.0 standard drink  Types: 1 Cans of beer per week   Drug use: Never   Objective:   Vitals:  11/17/20 0940  BP: 120/80  Pulse: 73  Weight: 99.2 kg (218 lb 9.6 oz)  Height: 200.7 cm ('6\' 7"'$ )   Body mass index is 24.63 kg/m.  Physical Exam General: Lean strong white male with obvious rheumatoid arthritis deformities of his hands HEENT: Unremarkable Chest: Clear Heart: Sinus rhythm Breast: Not examined Abdomen: Nontender GU large right inguinal hernia bulging despite his truss--he has had seeds for prostate cancer Thomas [name]) Rectal not done Extremities prior bilateral total knee replacements and a right hip with a rod and pinning from his fracture Neuro alert and oriented x3. Motor and sensory function grossly intact  Labs, Imaging and Diagnostic Testing: None  Assessment and Plan:  Diagnoses and all orders for this visit:  Right inguinal hernia--large I have discussed lap and open RIH repairs.      Will go ahead and get him scheduled for robotic right inguinal hernia at [facility] as an outpatient. He is aware of the nature of the procedure.  No follow-ups on file.  Cori Justus Donia Pounds, MD

## 2020-12-07 ENCOUNTER — Ambulatory Visit (HOSPITAL_COMMUNITY)
Admission: RE | Admit: 2020-12-07 | Discharge: 2020-12-07 | Disposition: A | Discharge status: Home / Self Care | Payer: Medicare Other | Attending: Surgery | Admitting: Surgery

## 2020-12-07 ENCOUNTER — Ambulatory Visit (HOSPITAL_COMMUNITY): Payer: Medicare Other | Admitting: Physician Assistant

## 2020-12-07 ENCOUNTER — Encounter (HOSPITAL_COMMUNITY): Payer: Self-pay | Admitting: Surgery

## 2020-12-07 ENCOUNTER — Ambulatory Visit (HOSPITAL_COMMUNITY): Payer: Medicare Other | Admitting: Anesthesiology

## 2020-12-07 ENCOUNTER — Encounter (HOSPITAL_COMMUNITY)
Admission: RE | Disposition: A | Discharge status: Home / Self Care | Payer: Self-pay | Source: Home / Self Care | Attending: Surgery

## 2020-12-07 DIAGNOSIS — M810 Age-related osteoporosis without current pathological fracture: Secondary | ICD-10-CM | POA: Diagnosis not present

## 2020-12-07 DIAGNOSIS — K4090 Unilateral inguinal hernia, without obstruction or gangrene, not specified as recurrent: Secondary | ICD-10-CM | POA: Insufficient documentation

## 2020-12-07 DIAGNOSIS — Z96653 Presence of artificial knee joint, bilateral: Secondary | ICD-10-CM | POA: Insufficient documentation

## 2020-12-07 DIAGNOSIS — Z79899 Other long term (current) drug therapy: Secondary | ICD-10-CM | POA: Diagnosis not present

## 2020-12-07 DIAGNOSIS — I110 Hypertensive heart disease with heart failure: Secondary | ICD-10-CM | POA: Diagnosis not present

## 2020-12-07 DIAGNOSIS — I5042 Chronic combined systolic (congestive) and diastolic (congestive) heart failure: Secondary | ICD-10-CM | POA: Diagnosis not present

## 2020-12-07 DIAGNOSIS — M069 Rheumatoid arthritis, unspecified: Secondary | ICD-10-CM | POA: Diagnosis not present

## 2020-12-07 SURGERY — HERNIORRHAPHY, INGUINAL, ROBOT-ASSISTED, LAPAROSCOPIC
Anesthesia: General | Site: Abdomen | Laterality: Right

## 2020-12-07 MED ORDER — ONDANSETRON HCL 4 MG/2ML IJ SOLN
INTRAMUSCULAR | Status: AC
  Filled 2020-12-07: qty 2

## 2020-12-07 MED ORDER — ONDANSETRON HCL 4 MG/2ML IJ SOLN
INTRAMUSCULAR | Status: DC | PRN
Start: 2020-12-07 — End: 2020-12-07
  Administered 2020-12-07: 4 mg via INTRAVENOUS
  Administered 2020-12-07: 300 mg via INTRAVENOUS

## 2020-12-07 MED ORDER — BUPIVACAINE LIPOSOME 1.3 % IJ SUSP
20.0000 mL | Freq: Once | INTRAMUSCULAR | Status: DC
  Filled 2020-12-07: qty 20

## 2020-12-07 MED ORDER — ACETAMINOPHEN 10 MG/ML IV SOLN
1000.0000 mg | Freq: Once | INTRAVENOUS | Status: DC | PRN
  Administered 2020-12-07: 1000 mg via INTRAVENOUS

## 2020-12-07 MED ORDER — MIDAZOLAM HCL 2 MG/2ML IJ SOLN
INTRAMUSCULAR | Status: DC | PRN
  Administered 2020-12-07: 2 mg via INTRAVENOUS

## 2020-12-07 MED ORDER — FENTANYL CITRATE (PF) 100 MCG/2ML IJ SOLN
INTRAMUSCULAR | Status: AC
  Administered 2020-12-07: 50 ug via INTRAVENOUS
  Filled 2020-12-07: qty 2

## 2020-12-07 MED ORDER — KETAMINE HCL 10 MG/ML IJ SOLN
INTRAMUSCULAR | Status: AC
  Filled 2020-12-07: qty 1

## 2020-12-07 MED ORDER — CEFAZOLIN SODIUM-DEXTROSE 2-4 GM/100ML-% IV SOLN: 2.0000 g | INTRAVENOUS | Status: DC

## 2020-12-07 MED ORDER — DEXAMETHASONE SODIUM PHOSPHATE 10 MG/ML IJ SOLN
INTRAMUSCULAR | Status: DC | PRN
  Administered 2020-12-07: 5 mg via INTRAVENOUS

## 2020-12-07 MED ORDER — ROCURONIUM BROMIDE 10 MG/ML (PF) SYRINGE
PREFILLED_SYRINGE | INTRAVENOUS | Status: DC | PRN
  Administered 2020-12-07: 30 mg via INTRAVENOUS
  Administered 2020-12-07: 70 mg via INTRAVENOUS
  Administered 2020-12-07 (×2): 30 mg via INTRAVENOUS
  Administered 2020-12-07: 20 mg via INTRAVENOUS

## 2020-12-07 MED ORDER — SCOPOLAMINE 1 MG/3DAYS TD PT72: 1.0000 | MEDICATED_PATCH | TRANSDERMAL | Status: DC

## 2020-12-07 MED ORDER — ACETAMINOPHEN 160 MG/5ML PO SOLN: 1000.0000 mg | Freq: Once | ORAL | Status: DC | PRN

## 2020-12-07 MED ORDER — ROCURONIUM BROMIDE 10 MG/ML (PF) SYRINGE
PREFILLED_SYRINGE | INTRAVENOUS | Status: AC
  Filled 2020-12-07: qty 10

## 2020-12-07 MED ORDER — CHLORHEXIDINE GLUCONATE 0.12 % MT SOLN
15.0000 mL | Freq: Once | OROMUCOSAL | Status: AC
  Administered 2020-12-07: 15 mL via OROMUCOSAL

## 2020-12-07 MED ORDER — CHLORHEXIDINE GLUCONATE CLOTH 2 % EX PADS: 6.0000 | MEDICATED_PAD | Freq: Once | CUTANEOUS | Status: DC

## 2020-12-07 MED ORDER — LACTATED RINGERS IV SOLN
INTRAVENOUS | Status: DC
  Administered 2020-12-07: 1000 mL via INTRAVENOUS

## 2020-12-07 MED ORDER — SODIUM CHLORIDE (PF) 0.9 % IJ SOLN
INTRAMUSCULAR | Status: AC
  Filled 2020-12-07: qty 10

## 2020-12-07 MED ORDER — ACETAMINOPHEN 500 MG PO TABS
1000.0000 mg | ORAL_TABLET | ORAL | Status: AC
  Administered 2020-12-07: 1000 mg via ORAL

## 2020-12-07 MED ORDER — FENTANYL CITRATE (PF) 100 MCG/2ML IJ SOLN
INTRAMUSCULAR | Status: AC
  Filled 2020-12-07: qty 2

## 2020-12-07 MED ORDER — FENTANYL CITRATE (PF) 100 MCG/2ML IJ SOLN
25.0000 ug | INTRAMUSCULAR | Status: DC | PRN
  Administered 2020-12-07: 25 ug via INTRAVENOUS
  Administered 2020-12-07: 50 ug via INTRAVENOUS

## 2020-12-07 MED ORDER — HYDROMORPHONE HCL 1 MG/ML IJ SOLN
INTRAMUSCULAR | Status: AC
  Filled 2020-12-07: qty 1

## 2020-12-07 MED ORDER — OXYCODONE-ACETAMINOPHEN 10-325 MG PO TABS: 1.0000 | ORAL_TABLET | Freq: Four times a day (QID) | ORAL | 0 refills | Status: DC | PRN

## 2020-12-07 MED ORDER — SODIUM CHLORIDE (PF) 0.9 % IJ SOLN
INTRAMUSCULAR | Status: DC | PRN
  Administered 2020-12-07: 10 mL

## 2020-12-07 MED ORDER — HYDROMORPHONE HCL 1 MG/ML IJ SOLN
0.2500 mg | INTRAMUSCULAR | Status: DC | PRN
  Administered 2020-12-07: 0.5 mg via INTRAVENOUS
  Administered 2020-12-07 (×2): 0.25 mg via INTRAVENOUS

## 2020-12-07 MED ORDER — OXYCODONE HCL 5 MG/5ML PO SOLN: 5.0000 mg | Freq: Once | ORAL | Status: AC | PRN

## 2020-12-07 MED ORDER — FENTANYL CITRATE (PF) 250 MCG/5ML IJ SOLN
INTRAMUSCULAR | Status: AC
  Filled 2020-12-07: qty 5

## 2020-12-07 MED ORDER — LIDOCAINE 2% (20 MG/ML) 5 ML SYRINGE
INTRAMUSCULAR | Status: AC
  Filled 2020-12-07: qty 5

## 2020-12-07 MED ORDER — ORAL CARE MOUTH RINSE: 15.0000 mL | Freq: Once | OROMUCOSAL | Status: AC

## 2020-12-07 MED ORDER — OXYCODONE HCL 5 MG PO TABS
ORAL_TABLET | ORAL | Status: AC
  Filled 2020-12-07: qty 1

## 2020-12-07 MED ORDER — ACETAMINOPHEN 500 MG PO TABS: 1000.0000 mg | ORAL_TABLET | Freq: Once | ORAL | Status: DC | PRN

## 2020-12-07 MED ORDER — 0.9 % SODIUM CHLORIDE (POUR BTL) OPTIME
TOPICAL | Status: DC | PRN
  Administered 2020-12-07: 1000 mL

## 2020-12-07 MED ORDER — FENTANYL CITRATE (PF) 250 MCG/5ML IJ SOLN
INTRAMUSCULAR | Status: DC | PRN
  Administered 2020-12-07 (×2): 100 ug via INTRAVENOUS
  Administered 2020-12-07: 50 ug via INTRAVENOUS
  Administered 2020-12-07: 100 ug via INTRAVENOUS
  Administered 2020-12-07 (×2): 50 ug via INTRAVENOUS

## 2020-12-07 MED ORDER — ACETAMINOPHEN 10 MG/ML IV SOLN
INTRAVENOUS | Status: AC
  Filled 2020-12-07: qty 100

## 2020-12-07 MED ORDER — SCOPOLAMINE 1 MG/3DAYS TD PT72
1.0000 | MEDICATED_PATCH | TRANSDERMAL | Status: DC
  Administered 2020-12-07: 1.5 mg via TRANSDERMAL
  Filled 2020-12-07: qty 1

## 2020-12-07 MED ORDER — ACETAMINOPHEN 500 MG PO TABS
1000.0000 mg | ORAL_TABLET | ORAL | Status: DC
  Filled 2020-12-07: qty 2

## 2020-12-07 MED ORDER — KETAMINE HCL 10 MG/ML IJ SOLN
INTRAMUSCULAR | Status: DC | PRN
  Administered 2020-12-07: 50 mg via INTRAVENOUS

## 2020-12-07 MED ORDER — CEFAZOLIN SODIUM-DEXTROSE 2-4 GM/100ML-% IV SOLN
2.0000 g | INTRAVENOUS | Status: AC
  Administered 2020-12-07: 2 g via INTRAVENOUS
  Filled 2020-12-07: qty 100

## 2020-12-07 MED ORDER — MIDAZOLAM HCL 2 MG/2ML IJ SOLN
INTRAMUSCULAR | Status: AC
  Filled 2020-12-07: qty 2

## 2020-12-07 MED ORDER — LIDOCAINE 2% (20 MG/ML) 5 ML SYRINGE
INTRAMUSCULAR | Status: DC | PRN
  Administered 2020-12-07: 80 mg via INTRAVENOUS

## 2020-12-07 MED ORDER — PROPOFOL 10 MG/ML IV BOLUS
INTRAVENOUS | Status: DC | PRN
  Administered 2020-12-07: 140 mg via INTRAVENOUS
  Administered 2020-12-07: 20 mg via INTRAVENOUS

## 2020-12-07 MED ORDER — BUPIVACAINE LIPOSOME 1.3 % IJ SUSP
INTRAMUSCULAR | Status: DC | PRN
  Administered 2020-12-07: 20 mL

## 2020-12-07 MED ORDER — OXYCODONE HCL 5 MG PO TABS
5.0000 mg | ORAL_TABLET | Freq: Once | ORAL | Status: AC | PRN
  Administered 2020-12-07: 5 mg via ORAL

## 2020-12-07 MED ORDER — PROPOFOL 10 MG/ML IV BOLUS
INTRAVENOUS | Status: AC
  Filled 2020-12-07: qty 40

## 2020-12-07 SURGICAL SUPPLY — 50 items
APPLICATOR COTTON TIP 6 STRL (MISCELLANEOUS) ×2 IMPLANT
APPLICATOR COTTON TIP 6IN STRL (MISCELLANEOUS) ×4
BAG COUNTER SPONGE SURGICOUNT (BAG) IMPLANT
BLADE SURG 15 STRL LF DISP TIS (BLADE) ×1 IMPLANT
BLADE SURG 15 STRL SS (BLADE) ×1
CHLORAPREP W/TINT 26 (MISCELLANEOUS) ×2 IMPLANT
COVER MAYO STAND STRL (DRAPES) ×2 IMPLANT
COVER SURGICAL LIGHT HANDLE (MISCELLANEOUS) ×2 IMPLANT
COVER TIP SHEARS 8 DVNC (MISCELLANEOUS) ×1 IMPLANT
COVER TIP SHEARS 8MM DA VINCI (MISCELLANEOUS) ×1
DECANTER SPIKE VIAL GLASS SM (MISCELLANEOUS) ×2 IMPLANT
DERMABOND ADVANCED (GAUZE/BANDAGES/DRESSINGS) ×1
DERMABOND ADVANCED .7 DNX12 (GAUZE/BANDAGES/DRESSINGS) ×1 IMPLANT
DRAPE ARM DVNC X/XI (DISPOSABLE) ×3 IMPLANT
DRAPE COLUMN DVNC XI (DISPOSABLE) ×1 IMPLANT
DRAPE DA VINCI XI ARM (DISPOSABLE) ×6
DRAPE DA VINCI XI COLUMN (DISPOSABLE) ×1
ELECT REM PT RETURN 15FT ADLT (MISCELLANEOUS) ×2 IMPLANT
GLOVE SURG ENC TEXT LTX SZ8 (GLOVE) ×4 IMPLANT
GOWN STRL REUS W/TWL XL LVL3 (GOWN DISPOSABLE) ×6 IMPLANT
KIT BASIN OR (CUSTOM PROCEDURE TRAY) ×2 IMPLANT
KIT TURNOVER KIT A (KITS) ×2 IMPLANT
MESH 3DMAX 4X6 RT LRG (Mesh General) ×2 IMPLANT
NEEDLE HYPO 22GX1.5 SAFETY (NEEDLE) ×2 IMPLANT
OBTURATOR OPTICAL STANDARD 8MM (TROCAR)
OBTURATOR OPTICAL STND 8 DVNC (TROCAR)
OBTURATOR OPTICALSTD 8 DVNC (TROCAR) IMPLANT
PACK CARDIOVASCULAR III (CUSTOM PROCEDURE TRAY) ×2 IMPLANT
PAD POSITIONING PINK XL (MISCELLANEOUS) ×2 IMPLANT
SCISSORS LAP 5X35 DISP (ENDOMECHANICALS) IMPLANT
SEAL CANN UNIV 5-8 DVNC XI (MISCELLANEOUS) ×3 IMPLANT
SEAL XI 5MM-8MM UNIVERSAL (MISCELLANEOUS) ×3
SET IRRIG TUBING LAPAROSCOPIC (IRRIGATION / IRRIGATOR) ×2 IMPLANT
SOL ANTI FOG 6CC (MISCELLANEOUS) ×1 IMPLANT
SOLUTION ANTI FOG 6CC (MISCELLANEOUS) ×1
SOLUTION ELECTROLUBE (MISCELLANEOUS) ×2 IMPLANT
SPONGE T-LAP 18X18 ~~LOC~~+RFID (SPONGE) ×2 IMPLANT
SUT MNCRL AB 4-0 PS2 18 (SUTURE) ×2 IMPLANT
SUT VIC AB 3-0 SH 27 (SUTURE)
SUT VIC AB 3-0 SH 27XBRD (SUTURE) IMPLANT
SUT VLOC 180 2-0 6IN GS21 (SUTURE) ×2 IMPLANT
SUT VLOC 180 2-0 9IN GS21 (SUTURE) IMPLANT
SYR 10ML ECCENTRIC (SYRINGE) ×2 IMPLANT
SYR 20ML LL LF (SYRINGE) ×2 IMPLANT
TAPE STRIPS DRAPE STRL (GAUZE/BANDAGES/DRESSINGS) ×2 IMPLANT
TOWEL OR 17X26 10 PK STRL BLUE (TOWEL DISPOSABLE) ×2 IMPLANT
TOWEL OR NON WOVEN STRL DISP B (DISPOSABLE) ×2 IMPLANT
TROCAR ADV FIXATION 12X100MM (TROCAR) IMPLANT
TROCAR BLADELESS OPT 5 100 (ENDOMECHANICALS) ×2 IMPLANT
TUBING INSUFFLATION 10FT LAP (TUBING) ×2 IMPLANT

## 2020-12-07 NOTE — Anesthesia Procedure Notes (Addendum)
Date/Time: 12/07/2020 5:38 PM Performed by: Cynda Familia, CRNA Oxygen Delivery Method: Simple face mask Placement Confirmation: positive ETCO2 and breath sounds checked- equal and bilateral Dental Injury: Teeth and Oropharynx as per pre-operative assessment  Comments: Pt missing left lower and upper teeth prior to extubation- poor dentition

## 2020-12-07 NOTE — Op Note (Signed)
Thomas [name]  Mar 22, 1951   12/07/2020    PCP:  [name], MD   Surgeon: Kaylyn Lim, MD, FACS  Asst:  Greer Pickerel, MD, FACS  Anes:  general  Preop Dx: Large right inguinal hernia Postop Dx: Right scrotal indirect hernia with pantaloon small direct  Procedure: Xi Robotic TAPP repair with 3 D Max mesh Location Surgery: [facility] 0R 2 Complications: none  EBL:   20  cc  Drains: none  Description of Procedure:  The patient was taken to OR 2 .  After anesthesia was administered and the patient was prepped  with chloroprep  and a timeout was performed.  A Foley was inserted.    Access to the abdomen was achieved with a 5 mm Optiview on the left side for the left 8 mm trocar ultimately.  The 5 mm was placed without difficulty and then was upsized to the 8 mm robotic trocar.  Slightly to the right of midline with a another 8 mm port was placed and then laterally on the right was a 12 mm port placed obliquely.  Robot was docked first to the camera port and it was targeted.  The 2 arms were brought in and docked to the lateral 8 mm 8 and 12 mm ports respectively.  The hernia was large and although reducible was a scrotal hernia.  I made a flap about 6 cm above the defect and then created the flap and went down to the hernia sac.  Identified the pubis medially and swept out laterally.  The hernia sac was then dissected from the cord structures however he went all the way into the scrotum and I entered it and could see the external ring and beyond  .  After getting as much sac down as I could I elected to transect the sac and then peeled it proximally to create the appropriate sac for the mesh placement.  There was a small direct hernia noted and would make this a pantaloon hernia.  The large 3D max mesh was then trimmed inferiorly a bit and placed and tacked medially on the to the pubis and superiorly to the internal oblique with 2-0 Vicryl's.  The flap was then closed with a running  V-Loc from lateral to medial and then medial to lateral.  The site where the cord of been transected was closed with a figure-of-eight suture of 2-0 Vicryl incorporating there was fat lobule to reinforce that closure.  The port sites were injected with Exparel along with the right anterior superior iliac spine.  The 12 mm trocar was checked and was oblique and there was no weight as I felt safely introduced the PMI with his very tight fascia.  Also I think is at lower risk for hernia.  I then deflated the abdomen and closed the skin with 4-0 Monocryl and Dermabond  The patient tolerated the procedure well and was taken to the PACU in stable condition.     Matt B. Hassell Done, [place], [facility], [place]

## 2020-12-07 NOTE — Interval H&P Note (Signed)
History and Physical Interval Note:  12/07/2020 1:31 PM  Thomas [name]  has presented today for surgery, with the diagnosis of right inguinal hernia.  The various methods of treatment have been discussed with the patient and family. After consideration of risks, benefits and other options for treatment, the patient has consented to  Procedure(s): XI ROBOTIC ASSISTED RIGHT INGUINAL HERNIA (Right) as a surgical intervention.  The patient's history has been reviewed, patient examined, no change in status, stable for surgery.  I have reviewed the patient's chart and labs.  Questions were answered to the patient's satisfaction.     Thomas [name]

## 2020-12-07 NOTE — Anesthesia Postprocedure Evaluation (Signed)
Anesthesia Post Note  Patient: Thomas [name]  Procedure(s) Performed: XI ROBOTIC ASSISTED RIGHT INGUINAL HERNIA (Right: Abdomen)     Patient location during evaluation: PACU Anesthesia Type: General Level of consciousness: awake and alert Pain management: pain level controlled Vital Signs Assessment: post-procedure vital signs reviewed and stable Respiratory status: spontaneous breathing, nonlabored ventilation and respiratory function stable Cardiovascular status: blood pressure returned to baseline and stable Postop Assessment: no apparent nausea or vomiting Anesthetic complications: no   No notable events documented.  Last Vitals:  Vitals:   12/07/20 1900 12/07/20 1915  BP: (!) 161/90 (!) 153/89  Pulse: 66 70  Resp: 17 12  Temp:    SpO2: 96% (!) 87%    Last Pain:  Vitals:   12/07/20 1900  TempSrc:   PainSc: [place]

## 2020-12-07 NOTE — Progress Notes (Signed)
Patient was informed that the OR was delayed and his surgery start time would probably be closer to 2 pm today.  Patient voiced understanding.  Also called the patients wife, Shirlean Mylar and informed her of the surgery delay.  She voiced understanding as well.

## 2020-12-07 NOTE — Anesthesia Procedure Notes (Signed)
Procedure Name: Intubation Date/Time: 12/07/2020 2:15 PM Performed by: Lollie Sails, CRNA Pre-anesthesia Checklist: Patient identified, Emergency Drugs available, Suction available, Patient being monitored and Timeout performed Patient Re-evaluated:Patient Re-evaluated prior to induction Oxygen Delivery Method: Circle system utilized Preoxygenation: Pre-oxygenation with 100% oxygen Induction Type: IV induction Ventilation: Mask ventilation without difficulty Laryngoscope Size: Glidescope and 4 Grade View: Grade I Tube type: Oral Tube size: 8.0 mm Number of attempts: 1 Airway Equipment and Method: Rigid stylet and Video-laryngoscopy Placement Confirmation: ETT inserted through vocal cords under direct vision, positive ETCO2 and breath sounds checked- equal and bilateral Secured at: 23 cm Tube secured with: Tape Dental Injury: Teeth and Oropharynx as per pre-operative assessment  Difficulty Due To: Difficult Airway- due to anterior larynx Comments: Elective glidescope use due to need in previous OR case.   Airway anterior noted preop.   Grade 1 view with Glidescope and easy passage of endotracheal tube.

## 2020-12-07 NOTE — Transfer of Care (Signed)
Immediate Anesthesia Transfer of Care Note  Patient: Thomas [name]  Procedure(s) Performed: XI ROBOTIC ASSISTED RIGHT INGUINAL HERNIA (Right: Abdomen)  Patient Location: PACU  Anesthesia Type:General  Level of Consciousness: awake and alert   Airway & Oxygen Therapy: Patient Spontanous Breathing and Patient connected to face mask oxygen  Post-op Assessment: Report given to RN and Post -op Vital signs reviewed and stable  Post vital signs: Reviewed and stable  Last Vitals:  Vitals Value Taken Time  BP    Temp    Pulse    Resp    SpO2      Last Pain:  Vitals:   12/07/20 1155  TempSrc: Oral  PainSc:       Patients Stated Pain Goal: 6 ([id number])  Complications: No notable events documented.

## 2020-12-08 MED ORDER — SUGAMMADEX SODIUM 200 MG/2ML IV SOLN
INTRAVENOUS | Status: DC | PRN
  Administered 2020-12-07: 300 mg via INTRAVENOUS

## 2020-12-08 NOTE — Addendum Note (Signed)
Addendum  created 12/08/20 1229 by Victoriano Lain, CRNA   Intraprocedure Meds edited

## 2020-12-09 ENCOUNTER — Ambulatory Visit (INDEPENDENT_AMBULATORY_CARE_PROVIDER_SITE_OTHER): Payer: Medicare Other

## 2020-12-09 DIAGNOSIS — Z5329 Procedure and treatment not carried out because of patient's decision for other reasons: Secondary | ICD-10-CM

## 2020-12-09 NOTE — Progress Notes (Signed)
1st call to pt to start AWV via telephone at 1:58 pm - LVM 2nd call to pt to start AWV via telephone at 2:06 pm - LVM 3rd call to pt to start AWV via telephone at 2:28 pm - LVM to call to reschedule

## 2020-12-20 ENCOUNTER — Ambulatory Visit: Payer: Medicare Other | Admitting: Family Medicine

## 2020-12-20 ENCOUNTER — Ambulatory Visit: Payer: Self-pay | Admitting: Student

## 2020-12-20 DIAGNOSIS — S72001A Fracture of unspecified part of neck of right femur, initial encounter for closed fracture: Secondary | ICD-10-CM

## 2020-12-22 ENCOUNTER — Other Ambulatory Visit (HOSPITAL_COMMUNITY): Payer: Self-pay

## 2020-12-23 ENCOUNTER — Other Ambulatory Visit: Payer: Self-pay

## 2020-12-23 ENCOUNTER — Encounter (HOSPITAL_COMMUNITY): Payer: Self-pay | Admitting: Student

## 2020-12-23 NOTE — Progress Notes (Signed)
Anesthesia Chart Review: SAME DAY WORK-UP  Case: [id number] Date/Time: 12/24/20 0715   Procedure: REVISION FIXATION OF RIGHT INTERTROCHANTERIC FEMUR NONUNION (Right)   Anesthesia type: General   Diagnosis: Closed fracture of right hip, initial encounter ([place]) [S72.001A]   Pre-op diagnosis: Right femur nonunion   Location: [facility] OR ROOM 03 / [place] [facility]   Surgeons: Shona Needles, MD       DISCUSSION: Patient is a 70 year old male scheduled for the above procedure. S/p right inguinal hernia repair 12/07/20.   History includes never smoker, HTN, atrial flutter, (s/p DCCV 02/11/20), nonischemic cardiomyopathy (no significant CAD 01/2020), CHF (EF < 20% 01/2020, 55-60% 08/2020), RA (on prednisone 5 mg daily), prostate cancer (s/p radioactive seed implant 04/11/18), TKA (left 09/13/15), osteomyelitis (s/p right 3rd toe amputation), right femur fracture (s/p cephalomedullary nailing 05/04/20).  He had preoperative cardiology input for recent hernia repair. He was felt "acceptable risk" for that procedure and was given permission to hold Eliquis for 2 days prior. Current medication list does not include Eliquis--RN spoke with him. He said Eliquis was not resumed after his hernia repair since he had planned hip surgery a few weeks later.   He is posted as needing a COVID test on the day of surgery. Anesthesia team to evaluate on the day of surgery.   VS:  BP Readings from Last 3 Encounters:  12/07/20 (!) 166/95  11/29/20 (!) 155/84  10/12/20 138/80   Pulse Readings from Last 3 Encounters:  12/07/20 74  11/29/20 68  10/12/20 69     PROVIDERS: [name], MD is PCP  Gwyndolyn Kaufman, MD is Cardiologist  Leigh Aurora, MD is rheumatologist  LABS: Most recent lab results include: Lab Results  Component Value Date   WBC 4.9 11/29/2020   HGB 11.1 (L) 11/29/2020   HCT 36.0 (L) 11/29/2020   PLT 255 11/29/2020   GLUCOSE 103 (H) 11/29/2020   NA 140 11/29/2020   K 3.6 11/29/2020   CL 104  11/29/2020   CREATININE 0.79 11/29/2020   BUN 15 11/29/2020   CO2 28 11/29/2020   TSH 3.740 03/31/2020   INR 1.1 05/04/2020   HGBA1C 6.2 (H) 02/03/2020    IMAGES: CT Right Hip 10/16/20: IMPRESSION: - Prior intramedullary nail and dynamic screw fixation of the proximal femur for an intertrochanteric fracture. Persistent intertrochanteric fracture line with changes of healing but minimal bony bridging. - New transverse fracture component not visualized on prior radiograph, which appears subacute, with increased neck-shaft angle/varus angulation. Fractured intramedullary nail with slight offset where the dynamic screw courses through it. Mild lucency surrounding the proximal intramedullary nail segment within the greater trochanter, but no lucency along the dynamic nail in the neck or distal aspect of the intramedullary nail in the femoral shaft.   EKG: 05/04/20 Rate 65 bpm  Sinus or ectopic atrial rhythm Borderline prolonged PR interval Probable LVH with secondary repol abnrm No significant change since last tracing   CV: Echo 08/27/2020  1. Left ventricular ejection fraction, by estimation, is 55 to 60%. The  left ventricle has normal function. The left ventricle has no regional  wall motion abnormalities. There is mild left ventricular hypertrophy.  Left ventricular diastolic parameters  are consistent with Grade I diastolic dysfunction (impaired relaxation).   2. Right ventricular systolic function is normal. The right ventricular  size is normal. There is normal pulmonary artery systolic pressure.   3. Right atrial size was moderately dilated.   4. The mitral valve is grossly normal. Trivial  mitral valve  regurgitation.   5. The aortic valve is tricuspid. Aortic valve regurgitation is trivial.  Mild aortic valve sclerosis is present, with no evidence of aortic valve  stenosis.   6. Aortic dilatation noted. There is mild dilatation of the aortic root,  measuring 41 mm.    7. The inferior vena cava is normal in size with greater than 50%  respiratory variability, suggesting right atrial pressure of 3 mmHg. (Comparison LVEF < 20% 02/02/20; 10-15% 02/11/20; 45-50% 05/06/20)   Cardiac Cath 02/09/2020 Conclusions: No angiographically significant coronary artery disease. Upper normal left heart, right heart, and pulmonary artery pressures. Mildly reduced Fick cardiac output/index.   Recommendations: Restart heparin infusion 2 hours after TR band removal.  If there is no evidence of bleeding/vascular complication, consider transitioning back to apixaban as soon as tomorrow. Proceed with TEE/cardioversion as soon as tomorrow.  I will make Thomas [name] NPO after midnight in anticipation of this. Optimize evidence-based heart failure therapy for non-ischemic cardiomyopathy. Primary prevention of coronary artery disease.  Past Medical History:  Diagnosis Date   Atrial flutter ([place]) 01/2020   Cancer ([place])    prostate   Heart failure with reduced ejection fraction (HCC)    Hypertension    Hypoglycemia    occ   NICM (nonischemic cardiomyopathy) ([place]) 02/12/2020   Prostate cancer ([place]) 2019   Rheumatoid arthritis [facility])     Past Surgical History:  Procedure Laterality Date   AMPUTATION TOE Right 12/2019   AMPUTATION TOE Right 03/26/2020   Procedure: Right 3rd toe partial amputation, bone biopsy right 1st metatarsal;  Surgeon: Evelina Bucy, DPM;  Location: [facility];  Service: Podiatry;  Laterality: Right;   BUBBLE STUDY  02/11/2020   Procedure: BUBBLE STUDY;  Surgeon: Geralynn Rile, MD;  Location: [place];  Service: Cardiovascular;;   CARDIOVERSION N/A 02/11/2020   Procedure: CARDIOVERSION;  Surgeon: Geralynn Rile, MD;  Location: [place];  Service: Cardiovascular;  Laterality: N/A;   CYSTOSCOPY  04/11/2018   Procedure: Erlene Quan;  Surgeon: [name], MD;  Location: [facility];  Service:  Urology;;  NO SEEDS FOUND IN BLADDER   HERNIA REPAIR  2009   inguinal   INTRAMEDULLARY (IM) NAIL INTERTROCHANTERIC Right 05/05/2020   Procedure: CPT 27245-Cephalomedullary nailing of right intertrochanteric femur fracture;  Surgeon: Shona Needles, MD;  Location: [place];  Service: Orthopedics;  Laterality: Right;   IRRIGATION AND DEBRIDEMENT FOOT Left 03/26/2020   Procedure: Left foot incision and drainage with removal of all non-viable soft tissue and bone - areas overlying the 2nd and 5th metatarsals.;  Surgeon: Evelina Bucy, DPM;  [facility];  Service: Podiatry;  Laterality: Left;   RADIOACTIVE SEED IMPLANT N/A 04/11/2018   Procedure: RADIOACTIVE SEED IMPLANT/BRACHYTHERAPY IMPLANT;  Surgeon: [name], MD;  Location[facility];  Service: Urology;  Laterality: N/A;   71     SEEDS IMPLANTED   RIGHT/LEFT HEART CATH AND CORONARY ANGIOGRAPHY N/A 02/09/2020   Procedure: RIGHT/LEFT HEART CATH AND CORONARY ANGIOGRAPHY;  Surgeon: Nelva Bush, MD;  Location: [place] [facility];  Service: Cardiovascular;  Laterality: N/A;   SPACE OAR INSTILLATION N/A 04/11/2018   Procedure: SPACE OAR INSTILLATION;  Surgeon: [name], MD;  Location[facility];  Service: Urology;  Laterality: N/A;   TEE WITHOUT CARDIOVERSION N/A 02/11/2020   Procedure: TRANSESOPHAGEAL ECHOCARDIOGRAM (TEE);  Surgeon: Geralynn Rile, MD;  Location: [place];  Service: Cardiovascular;  Laterality: N/A;  TOTAL KNEE ARTHROPLASTY Bilateral 09/13/2015   Procedure: BILATERAL KNEE ARTHROPLASTY ;  Surgeon: Paralee Cancel, MD;  [facility];  Service: Orthopedics;  Laterality: Bilateral;    MEDICATIONS: No current facility-administered medications for this encounter.    amiodarone (PACERONE) 200 MG tablet   aspirin-acetaminophen-caffeine (EXCEDRIN MIGRAINE) 250-250-65 MG tablet   diazepam (VALIUM) 5 MG tablet   DULoxetine (CYMBALTA) 60 MG capsule   lisinopril  (ZESTRIL) 10 MG tablet   metoprolol succinate (TOPROL XL) 25 MG 24 hr tablet   NARCAN 4 MG/0.1ML LIQD nasal spray kit   oxyCODONE-acetaminophen (PERCOCET) 10-325 MG tablet   predniSONE (DELTASONE) 5 MG tablet   testosterone cypionate (DEPOTESTOSTERONE CYPIONATE) 200 MG/ML injection    Myra Gianotti, PA-C Surgical Short Stay/Anesthesiology [facility] Phone [phone] [facility] Phone [phone] 12/23/2020 3:28 PM

## 2020-12-23 NOTE — Anesthesia Preprocedure Evaluation (Addendum)
Anesthesia Evaluation  Patient identified by MRN, date of birth, ID band Patient awake    Reviewed: Allergy & Precautions, NPO status , Patient's Chart, lab work & pertinent test results, reviewed documented beta blocker date and time   History of Anesthesia Complications Negative for: history of anesthetic complications  Airway Mallampati: II   Neck ROM: Full    Dental  (+) Poor Dentition, Missing, Chipped, Dental Advisory Given   Pulmonary neg pulmonary ROS,    breath sounds clear to auscultation       Cardiovascular hypertension, Pt. on medications and Pt. on home beta blockers (-) angina+ dysrhythmias Atrial Fibrillation  Rhythm:Regular Rate:Normal  08/2020 ECHO: EF 55-60%, no regional wall motion abnormalities, Grade 1 DD, no significant valvular abnormalities   Neuro/Psych Anxiety negative neurological ROS     GI/Hepatic negative GI ROS, Neg liver ROS,   Endo/Other  negative endocrine ROS  Renal/GU negative Renal ROS   H/o prostate cancer    Musculoskeletal  (+) Arthritis ,   Abdominal   Peds  Hematology Off of eliquis x 2 weeks   Anesthesia Other Findings   Reproductive/Obstetrics                           Anesthesia Physical Anesthesia Plan  ASA: 3  Anesthesia Plan: General   Post-op Pain Management:    Induction: Intravenous  PONV Risk Score and Plan: 2 and Ondansetron and Dexamethasone  Airway Management Planned: Oral ETT  Additional Equipment: None  Intra-op Plan:   Post-operative Plan: Extubation in OR  Informed Consent: I have reviewed the patients History and Physical, chart, labs and discussed the procedure including the risks, benefits and alternatives for the proposed anesthesia with the patient or authorized representative who has indicated his/her understanding and acceptance.     Dental advisory given  Plan Discussed with: CRNA and Surgeon  Anesthesia  Plan Comments: (PAT note written 12/23/2020 by Myra Gianotti, PA-C. Pt declines SAB)      Anesthesia Quick Evaluation

## 2020-12-23 NOTE — Progress Notes (Signed)
Pt. Needs covid test DOS 12/24/2020

## 2020-12-24 ENCOUNTER — Ambulatory Visit (HOSPITAL_COMMUNITY): Payer: Medicare Other

## 2020-12-24 ENCOUNTER — Ambulatory Visit (HOSPITAL_COMMUNITY): Payer: Medicare Other | Admitting: Physician Assistant

## 2020-12-24 ENCOUNTER — Inpatient Hospital Stay (HOSPITAL_COMMUNITY): Payer: Medicare Other

## 2020-12-24 ENCOUNTER — Encounter (HOSPITAL_COMMUNITY): Payer: Self-pay | Admitting: Student

## 2020-12-24 ENCOUNTER — Inpatient Hospital Stay (HOSPITAL_COMMUNITY)
Admission: AD | Admit: 2020-12-24 | Discharge: 2020-12-25 | DRG: 481 | Disposition: A | Discharge status: Home / Self Care | Payer: Medicare Other | Attending: Student | Admitting: Student

## 2020-12-24 ENCOUNTER — Other Ambulatory Visit: Payer: Self-pay

## 2020-12-24 ENCOUNTER — Encounter (HOSPITAL_COMMUNITY)
Admission: AD | Disposition: A | Discharge status: Home / Self Care | Payer: Self-pay | Source: Home / Self Care | Attending: Student

## 2020-12-24 ENCOUNTER — Other Ambulatory Visit (HOSPITAL_COMMUNITY): Payer: Self-pay

## 2020-12-24 DIAGNOSIS — T84114A Breakdown (mechanical) of internal fixation device of right femur, initial encounter: Secondary | ICD-10-CM | POA: Diagnosis not present

## 2020-12-24 DIAGNOSIS — Z79899 Other long term (current) drug therapy: Secondary | ICD-10-CM | POA: Diagnosis not present

## 2020-12-24 DIAGNOSIS — I110 Hypertensive heart disease with heart failure: Secondary | ICD-10-CM | POA: Diagnosis not present

## 2020-12-24 DIAGNOSIS — Z8042 Family history of malignant neoplasm of prostate: Secondary | ICD-10-CM

## 2020-12-24 DIAGNOSIS — I428 Other cardiomyopathies: Secondary | ICD-10-CM | POA: Diagnosis not present

## 2020-12-24 DIAGNOSIS — Z96653 Presence of artificial knee joint, bilateral: Secondary | ICD-10-CM | POA: Diagnosis present

## 2020-12-24 DIAGNOSIS — T148XXA Other injury of unspecified body region, initial encounter: Secondary | ICD-10-CM

## 2020-12-24 DIAGNOSIS — I5022 Chronic systolic (congestive) heart failure: Secondary | ICD-10-CM | POA: Diagnosis not present

## 2020-12-24 DIAGNOSIS — Z981 Arthrodesis status: Secondary | ICD-10-CM | POA: Diagnosis not present

## 2020-12-24 DIAGNOSIS — M069 Rheumatoid arthritis, unspecified: Secondary | ICD-10-CM | POA: Diagnosis not present

## 2020-12-24 DIAGNOSIS — Z888 Allergy status to other drugs, medicaments and biological substances status: Secondary | ICD-10-CM

## 2020-12-24 DIAGNOSIS — Z419 Encounter for procedure for purposes other than remedying health state, unspecified: Secondary | ICD-10-CM

## 2020-12-24 DIAGNOSIS — Z89421 Acquired absence of other right toe(s): Secondary | ICD-10-CM

## 2020-12-24 DIAGNOSIS — Y793 Surgical instruments, materials and orthopedic devices (including sutures) associated with adverse incidents: Secondary | ICD-10-CM | POA: Diagnosis present

## 2020-12-24 DIAGNOSIS — I5042 Chronic combined systolic (congestive) and diastolic (congestive) heart failure: Secondary | ICD-10-CM | POA: Diagnosis not present

## 2020-12-24 DIAGNOSIS — I4892 Unspecified atrial flutter: Secondary | ICD-10-CM | POA: Diagnosis not present

## 2020-12-24 DIAGNOSIS — Z79890 Hormone replacement therapy (postmenopausal): Secondary | ICD-10-CM | POA: Diagnosis not present

## 2020-12-24 DIAGNOSIS — Z20822 Contact with and (suspected) exposure to covid-19: Secondary | ICD-10-CM | POA: Diagnosis not present

## 2020-12-24 DIAGNOSIS — Z7952 Long term (current) use of systemic steroids: Secondary | ICD-10-CM | POA: Diagnosis not present

## 2020-12-24 DIAGNOSIS — Z8546 Personal history of malignant neoplasm of prostate: Secondary | ICD-10-CM | POA: Diagnosis not present

## 2020-12-24 DIAGNOSIS — S72141K Displaced intertrochanteric fracture of right femur, subsequent encounter for closed fracture with nonunion: Secondary | ICD-10-CM | POA: Diagnosis present

## 2020-12-24 DIAGNOSIS — Z01818 Encounter for other preprocedural examination: Secondary | ICD-10-CM | POA: Diagnosis not present

## 2020-12-24 DIAGNOSIS — T84114D Breakdown (mechanical) of internal fixation device of right femur, subsequent encounter: Principal | ICD-10-CM

## 2020-12-24 DIAGNOSIS — S72141A Displaced intertrochanteric fracture of right femur, initial encounter for closed fracture: Secondary | ICD-10-CM | POA: Diagnosis not present

## 2020-12-24 DIAGNOSIS — S72009A Fracture of unspecified part of neck of unspecified femur, initial encounter for closed fracture: Secondary | ICD-10-CM | POA: Insufficient documentation

## 2020-12-24 HISTORY — PX: INTRAMEDULLARY (IM) NAIL INTERTROCHANTERIC: SHX5875

## 2020-12-24 LAB — SARS CORONAVIRUS 2 BY RT PCR (HOSPITAL ORDER, PERFORMED IN ~~LOC~~ HOSPITAL LAB): SARS Coronavirus 2: NEGATIVE

## 2020-12-24 IMAGING — DX DG HIP (WITH OR WITHOUT PELVIS) 1V PORT*R*
2 series · 2 of 2 positions shown · non-contrast
Comparison: CT [DATE]

CLINICAL DATA: Fracture.  Postop.

EXAM:
DG HIP (WITH OR WITHOUT PELVIS) 1V PORT RIGHT

[pelvis]
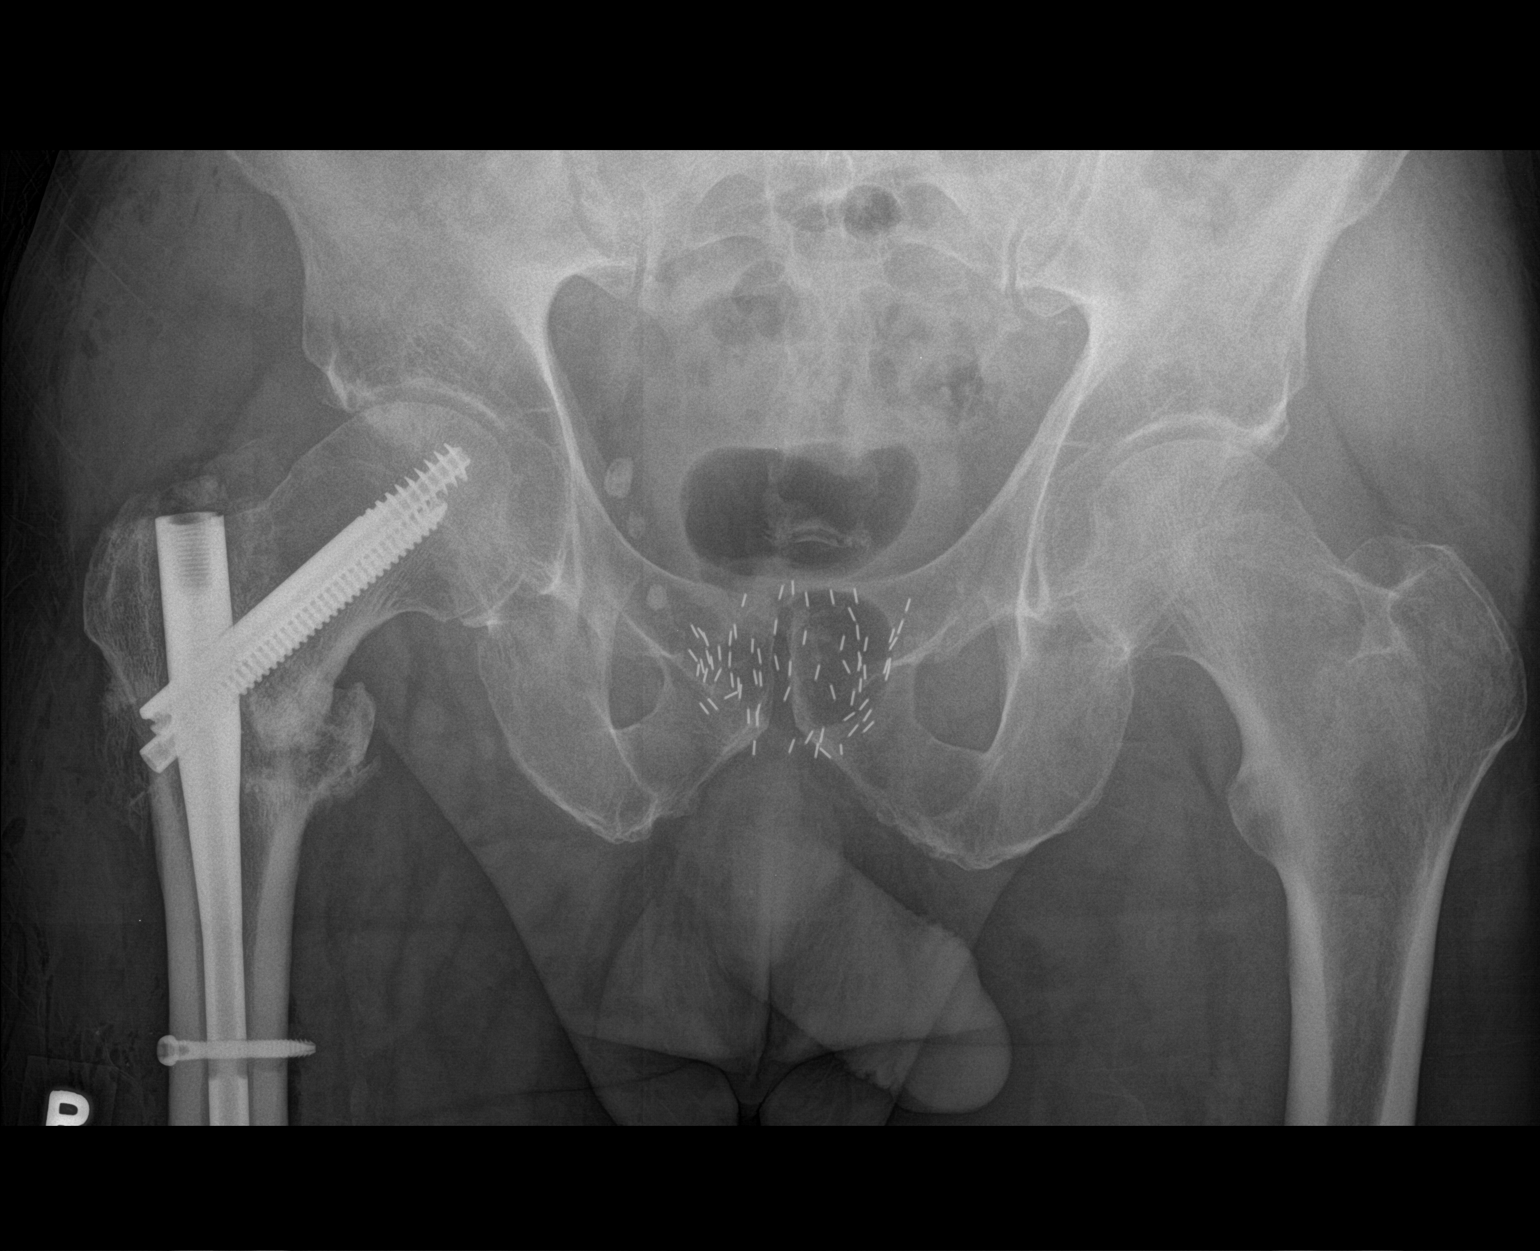

[hip]
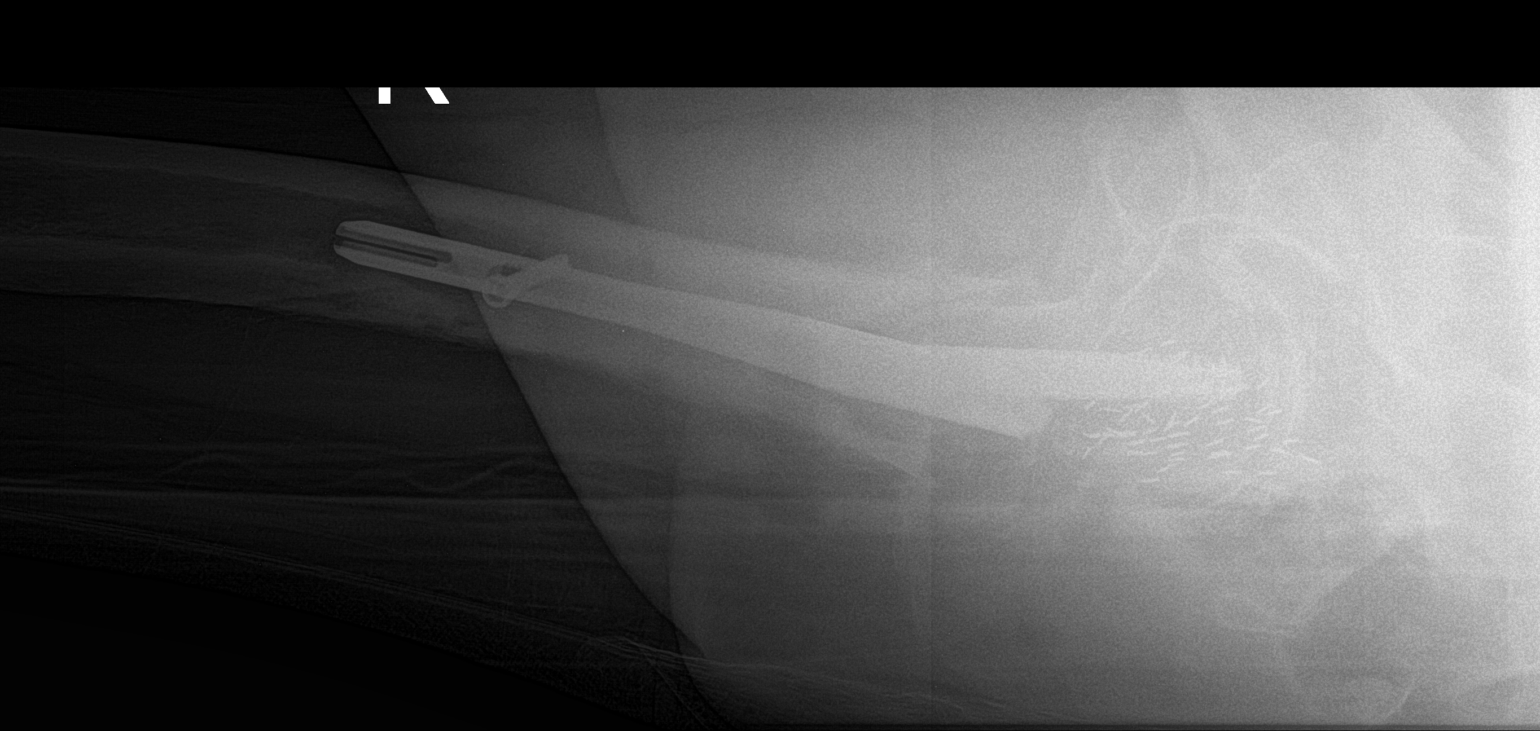

[2 of 2 positions shown; findings below may reference images not displayed]

FINDINGS: Intramedullary rod with 2 trans trochanteric and single distal
locking screw fixation of proximal femur fracture. Unchanged
alignment from preoperative imaging. No new fracture. Recent
postsurgical change includes air and edema in the soft tissues.
IMPRESSION: Revision of right hip hardware without immediate postoperative
complication.

## 2020-12-24 IMAGING — RF DG FEMUR 2+V*R*
1 series · 9 of 9 positions shown · non-contrast
Comparison: CT [DATE]

CLINICAL DATA: Revision surgery.

EXAM:
RIGHT FEMUR 2 VIEWS

[Series 1: run · 9 of 9 slices shown]
[im 1/9]
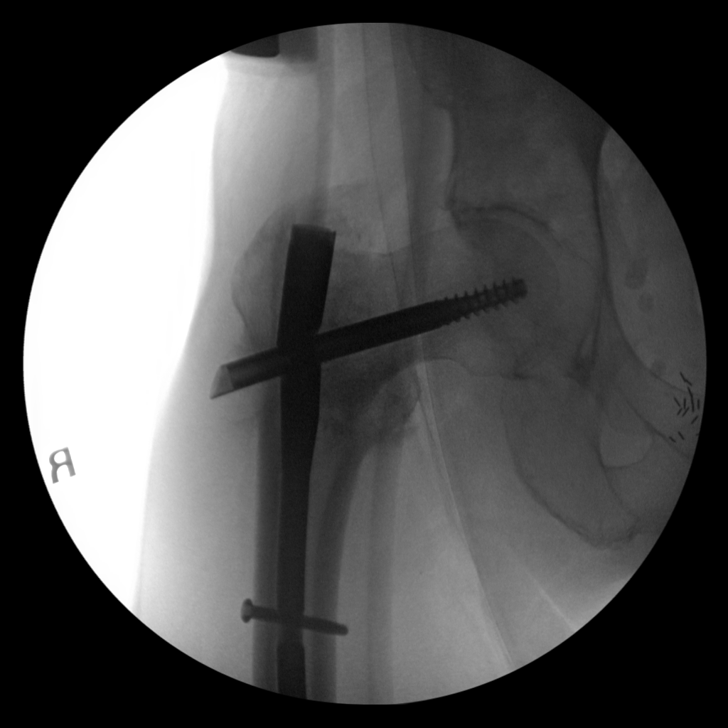
[im 2/9]
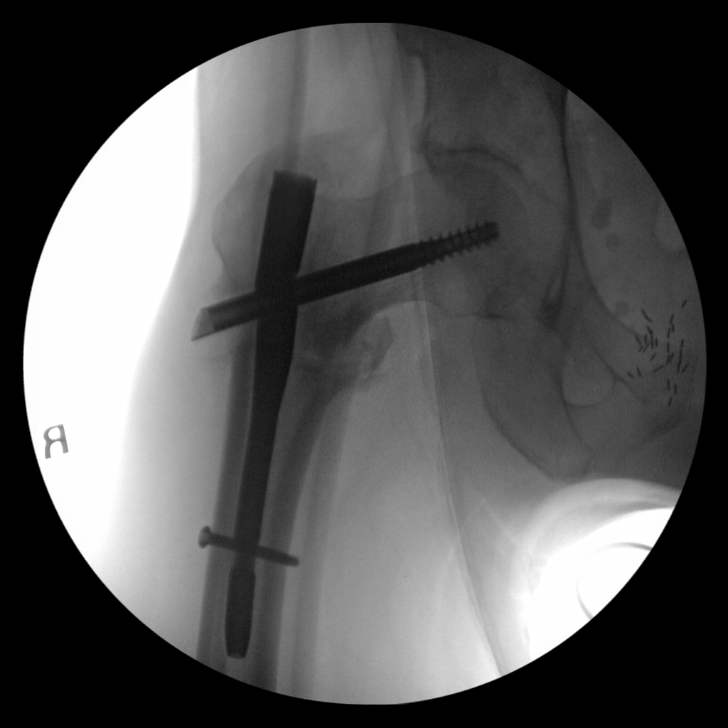
[im 3/9]
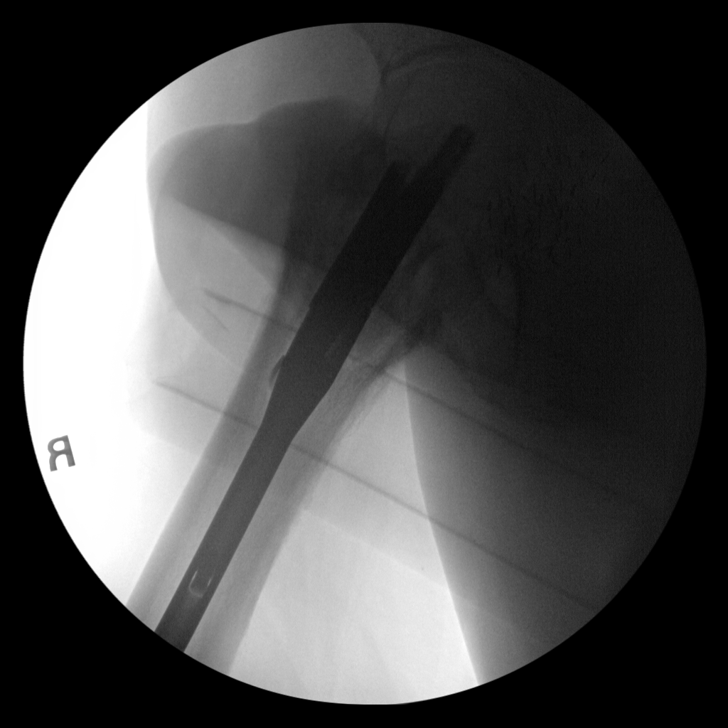
[im 4/9]
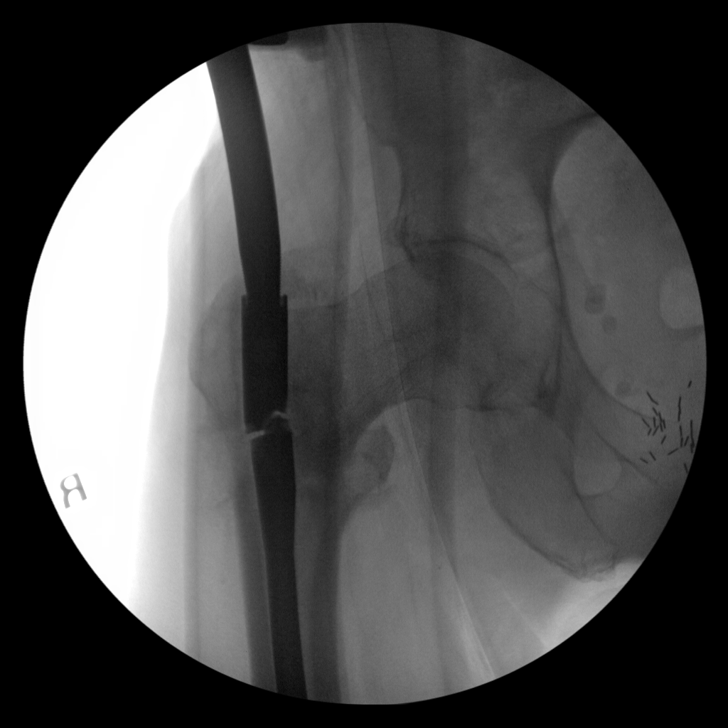
[im 5/9]
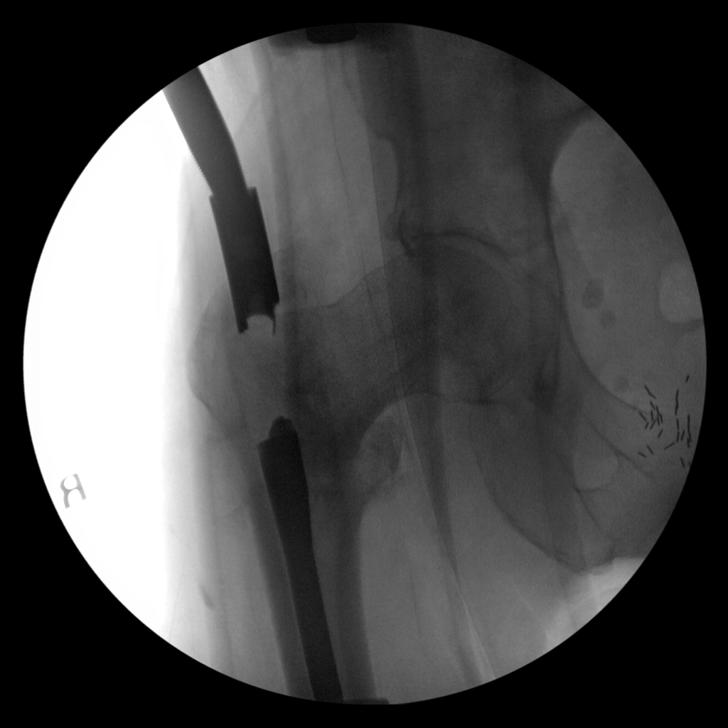
[im 6/9]
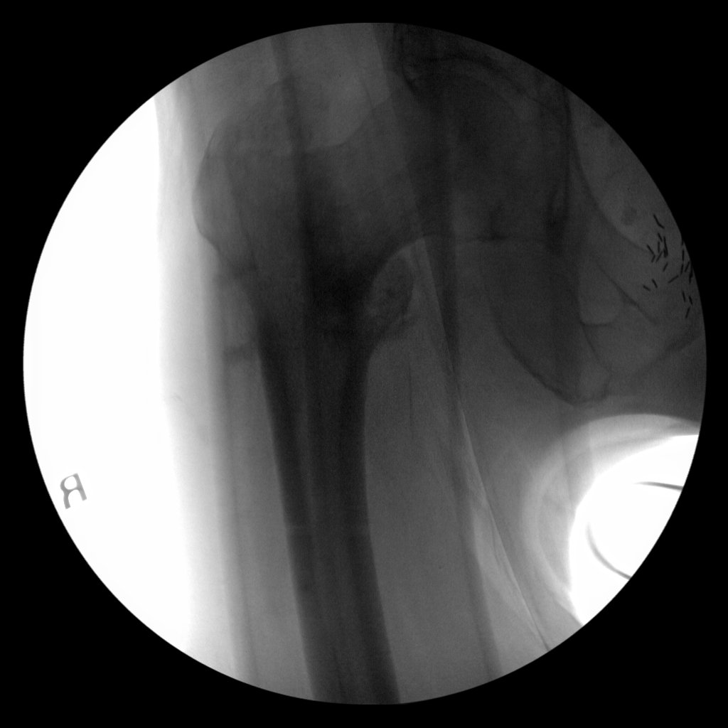
[im 7/9]
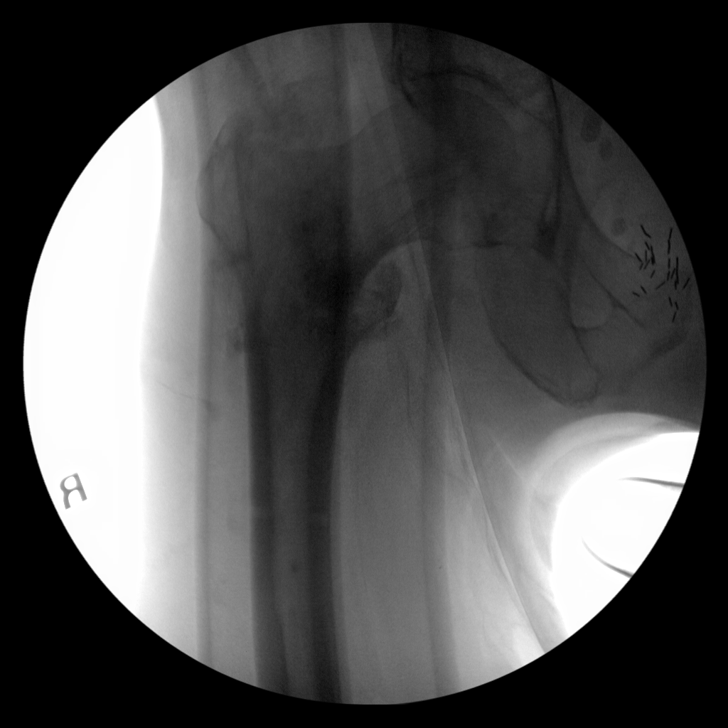
[im 8/9]
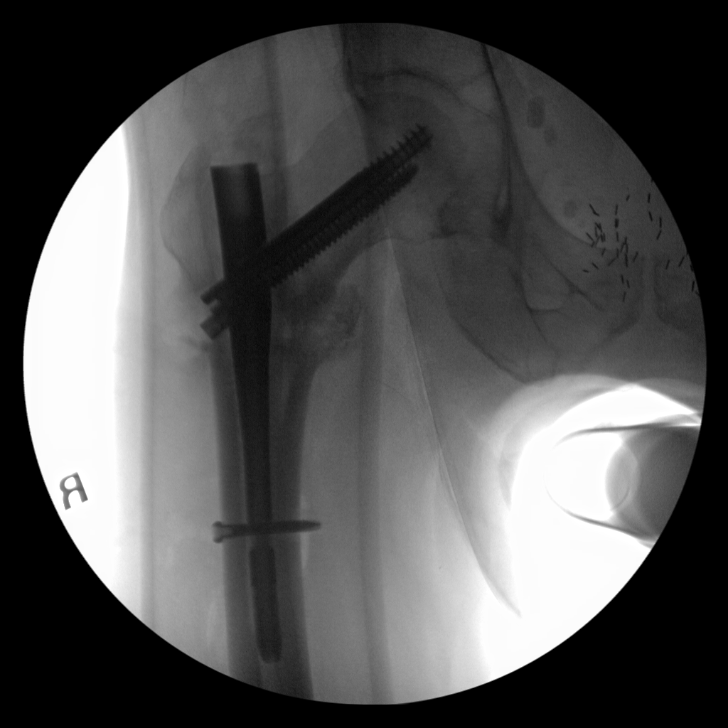
[im 9/9]
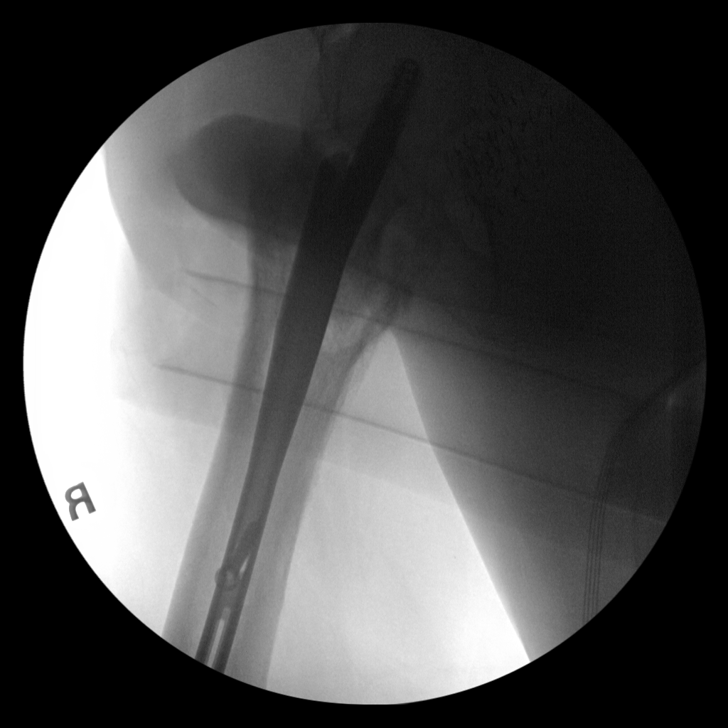

[9 of 9 positions shown; findings below may reference images not displayed]

FINDINGS: Nine fluoroscopic spot views of the right femur obtained in the
operating room. Removal of previous intramedullary nail and locking
screws. Placement of new intramedullary nail, trans trochanteric and
distal locking screw. Fluoroscopy time 3 minutes 15 seconds. Dose
34.67 mGy.
IMPRESSION: Intraoperative fluoroscopy for revision of right hip hardware.

## 2020-12-24 SURGERY — FIXATION, FRACTURE, INTERTROCHANTERIC, WITH INTRAMEDULLARY ROD
Anesthesia: General | Laterality: Right

## 2020-12-24 MED ORDER — METOCLOPRAMIDE HCL 5 MG/ML IJ SOLN
5.0000 mg | Freq: Three times a day (TID) | INTRAMUSCULAR | Status: DC | PRN
Start: 2020-12-24 — End: 2020-12-25

## 2020-12-24 MED ORDER — METHOCARBAMOL 750 MG PO TABS
750.0000 mg | ORAL_TABLET | Freq: Four times a day (QID) | ORAL | Status: DC | PRN
  Administered 2020-12-24 – 2020-12-25 (×2): 750 mg via ORAL
  Filled 2020-12-24 (×3): qty 1

## 2020-12-24 MED ORDER — OXYCODONE HCL 5 MG/5ML PO SOLN
5.0000 mg | Freq: Once | ORAL | Status: AC | PRN
Start: 2020-12-24 — End: 2020-12-24

## 2020-12-24 MED ORDER — FENTANYL CITRATE (PF) 250 MCG/5ML IJ SOLN
INTRAMUSCULAR | Status: AC
  Filled 2020-12-24: qty 5

## 2020-12-24 MED ORDER — ACETAMINOPHEN 500 MG PO TABS
1000.0000 mg | ORAL_TABLET | Freq: Once | ORAL | Status: AC
  Administered 2020-12-24: 1000 mg via ORAL

## 2020-12-24 MED ORDER — ACETAMINOPHEN 325 MG PO TABS
325.0000 mg | ORAL_TABLET | Freq: Four times a day (QID) | ORAL | Status: DC | PRN
  Filled 2020-12-24: qty 2

## 2020-12-24 MED ORDER — OXYCODONE-ACETAMINOPHEN 5-325 MG PO TABS
1.0000 | ORAL_TABLET | Freq: Four times a day (QID) | ORAL | Status: DC | PRN
  Administered 2020-12-24: 1 via ORAL
  Filled 2020-12-24: qty 1

## 2020-12-24 MED ORDER — ONDANSETRON HCL 4 MG/2ML IJ SOLN: 4.0000 mg | Freq: Four times a day (QID) | INTRAMUSCULAR | Status: DC | PRN

## 2020-12-24 MED ORDER — HYDROMORPHONE HCL 1 MG/ML IJ SOLN: 0.5000 mg | INTRAMUSCULAR | Status: DC | PRN

## 2020-12-24 MED ORDER — CEFAZOLIN SODIUM-DEXTROSE 2-4 GM/100ML-% IV SOLN
2.0000 g | Freq: Three times a day (TID) | INTRAVENOUS | Status: AC
Start: 2020-12-24 — End: 2020-12-25
  Administered 2020-12-24 – 2020-12-25 (×3): 2 g via INTRAVENOUS
  Filled 2020-12-24 (×3): qty 100

## 2020-12-24 MED ORDER — HYDROMORPHONE HCL 1 MG/ML IJ SOLN
2.0000 mg | INTRAMUSCULAR | Status: DC | PRN
  Administered 2020-12-24 – 2020-12-25 (×3): 2 mg via INTRAVENOUS
  Filled 2020-12-24 (×5): qty 2

## 2020-12-24 MED ORDER — VANCOMYCIN HCL 1000 MG IV SOLR
INTRAVENOUS | Status: DC | PRN
  Administered 2020-12-24: 1000 mg via TOPICAL

## 2020-12-24 MED ORDER — MIDAZOLAM HCL 5 MG/5ML IJ SOLN
INTRAMUSCULAR | Status: DC | PRN
  Administered 2020-12-24: 2 mg via INTRAVENOUS

## 2020-12-24 MED ORDER — OXYCODONE HCL 5 MG PO TABS
5.0000 mg | ORAL_TABLET | Freq: Once | ORAL | Status: AC | PRN
Start: 2020-12-24 — End: 2020-12-24
  Administered 2020-12-24: 5 mg via ORAL

## 2020-12-24 MED ORDER — DEXAMETHASONE SODIUM PHOSPHATE 10 MG/ML IJ SOLN
INTRAMUSCULAR | Status: AC
  Filled 2020-12-24: qty 1

## 2020-12-24 MED ORDER — PROPOFOL 10 MG/ML IV BOLUS
INTRAVENOUS | Status: AC
  Filled 2020-12-24: qty 20

## 2020-12-24 MED ORDER — HYDROMORPHONE HCL 1 MG/ML IJ SOLN
INTRAMUSCULAR | Status: AC
  Administered 2020-12-24: 0.5 mg via INTRAVENOUS
  Filled 2020-12-24: qty 1

## 2020-12-24 MED ORDER — OXYCODONE-ACETAMINOPHEN 10-325 MG PO TABS: 1.0000 | ORAL_TABLET | Freq: Four times a day (QID) | ORAL | Status: DC | PRN

## 2020-12-24 MED ORDER — HYDROMORPHONE HCL 1 MG/ML IJ SOLN
0.5000 mg | INTRAMUSCULAR | Status: DC | PRN
  Administered 2020-12-24: 0.5 mg via INTRAVENOUS

## 2020-12-24 MED ORDER — POTASSIUM CHLORIDE IN NACL 20-0.9 MEQ/L-% IV SOLN
INTRAVENOUS | Status: DC
  Filled 2020-12-24: qty 1000

## 2020-12-24 MED ORDER — EPHEDRINE 5 MG/ML INJ
INTRAVENOUS | Status: AC
  Filled 2020-12-24: qty 5

## 2020-12-24 MED ORDER — ROCURONIUM BROMIDE 10 MG/ML (PF) SYRINGE
PREFILLED_SYRINGE | INTRAVENOUS | Status: AC
  Filled 2020-12-24: qty 10

## 2020-12-24 MED ORDER — OXYCODONE HCL 5 MG PO TABS: 5.0000 mg | ORAL_TABLET | ORAL | Status: DC | PRN

## 2020-12-24 MED ORDER — GABAPENTIN 300 MG PO CAPS
300.0000 mg | ORAL_CAPSULE | Freq: Three times a day (TID) | ORAL | Status: DC
  Filled 2020-12-24 (×2): qty 1

## 2020-12-24 MED ORDER — ONDANSETRON HCL 4 MG/2ML IJ SOLN
INTRAMUSCULAR | Status: AC
  Filled 2020-12-24: qty 2

## 2020-12-24 MED ORDER — LACTATED RINGERS IV SOLN: INTRAVENOUS | Status: DC

## 2020-12-24 MED ORDER — PHENYLEPHRINE HCL-NACL 20-0.9 MG/250ML-% IV SOLN
INTRAVENOUS | Status: DC | PRN
  Administered 2020-12-24: 25 ug/min via INTRAVENOUS

## 2020-12-24 MED ORDER — METHOCARBAMOL 500 MG PO TABS
500.0000 mg | ORAL_TABLET | Freq: Four times a day (QID) | ORAL | 0 refills | Status: DC | PRN
  Filled 2020-12-24: qty 28, 7d supply, fill #0

## 2020-12-24 MED ORDER — PROMETHAZINE HCL 25 MG/ML IJ SOLN: 6.2500 mg | INTRAMUSCULAR | Status: DC | PRN

## 2020-12-24 MED ORDER — OXYCODONE HCL 5 MG PO TABS
10.0000 mg | ORAL_TABLET | ORAL | Status: DC | PRN
  Administered 2020-12-24 – 2020-12-25 (×4): 10 mg via ORAL
  Filled 2020-12-24 (×4): qty 2

## 2020-12-24 MED ORDER — OXYCODONE-ACETAMINOPHEN 5-325 MG PO TABS
1.0000 | ORAL_TABLET | ORAL | Status: DC | PRN
  Administered 2020-12-24 – 2020-12-25 (×4): 1 via ORAL
  Filled 2020-12-24 (×4): qty 1

## 2020-12-24 MED ORDER — CHLORHEXIDINE GLUCONATE 0.12 % MT SOLN
15.0000 mL | Freq: Once | OROMUCOSAL | Status: AC
  Administered 2020-12-24: 15 mL via OROMUCOSAL

## 2020-12-24 MED ORDER — MEPERIDINE HCL 25 MG/ML IJ SOLN: 6.2500 mg | INTRAMUSCULAR | Status: DC | PRN

## 2020-12-24 MED ORDER — OXYCODONE-ACETAMINOPHEN 5-325 MG PO TABS: 1.0000 | ORAL_TABLET | ORAL | Status: DC | PRN

## 2020-12-24 MED ORDER — VANCOMYCIN HCL 1000 MG IV SOLR
INTRAVENOUS | Status: AC
  Filled 2020-12-24: qty 20

## 2020-12-24 MED ORDER — METOCLOPRAMIDE HCL 5 MG PO TABS: 5.0000 mg | ORAL_TABLET | Freq: Three times a day (TID) | ORAL | Status: DC | PRN

## 2020-12-24 MED ORDER — OXYCODONE-ACETAMINOPHEN 10-325 MG PO TABS
1.0000 | ORAL_TABLET | Freq: Four times a day (QID) | ORAL | 0 refills | Status: DC | PRN
  Filled 2020-12-24: qty 28, 7d supply, fill #0

## 2020-12-24 MED ORDER — LIDOCAINE 2% (20 MG/ML) 5 ML SYRINGE
INTRAMUSCULAR | Status: AC
  Filled 2020-12-24: qty 5

## 2020-12-24 MED ORDER — 0.9 % SODIUM CHLORIDE (POUR BTL) OPTIME
TOPICAL | Status: DC | PRN
  Administered 2020-12-24: 1000 mL

## 2020-12-24 MED ORDER — OXYCODONE HCL 5 MG PO TABS
5.0000 mg | ORAL_TABLET | Freq: Four times a day (QID) | ORAL | Status: DC | PRN
  Administered 2020-12-24: 5 mg via ORAL
  Filled 2020-12-24: qty 1

## 2020-12-24 MED ORDER — KETOROLAC TROMETHAMINE 15 MG/ML IJ SOLN: 15.0000 mg | Freq: Four times a day (QID) | INTRAMUSCULAR | Status: DC

## 2020-12-24 MED ORDER — HYDROMORPHONE HCL 1 MG/ML IJ SOLN
0.5000 mg | INTRAMUSCULAR | Status: DC | PRN
  Administered 2020-12-24: 1 mg via INTRAVENOUS
  Filled 2020-12-24: qty 1

## 2020-12-24 MED ORDER — PHENYLEPHRINE 40 MCG/ML (10ML) SYRINGE FOR IV PUSH (FOR BLOOD PRESSURE SUPPORT)
PREFILLED_SYRINGE | INTRAVENOUS | Status: DC | PRN
  Administered 2020-12-24 (×3): 80 ug via INTRAVENOUS

## 2020-12-24 MED ORDER — KETOROLAC TROMETHAMINE 15 MG/ML IJ SOLN
15.0000 mg | Freq: Four times a day (QID) | INTRAMUSCULAR | Status: DC
  Administered 2020-12-24: 15 mg via INTRAVENOUS
  Filled 2020-12-24: qty 1

## 2020-12-24 MED ORDER — DEXTROSE 5 % IV SOLN
500.0000 mg | Freq: Four times a day (QID) | INTRAVENOUS | Status: DC | PRN
  Filled 2020-12-24: qty 5

## 2020-12-24 MED ORDER — PREDNISONE 5 MG PO TABS
5.0000 mg | ORAL_TABLET | Freq: Every day | ORAL | Status: DC
  Administered 2020-12-24 – 2020-12-25 (×2): 5 mg via ORAL
  Filled 2020-12-24 (×2): qty 1

## 2020-12-24 MED ORDER — AMIODARONE HCL 200 MG PO TABS
200.0000 mg | ORAL_TABLET | Freq: Every day | ORAL | Status: DC
  Administered 2020-12-24 – 2020-12-25 (×2): 200 mg via ORAL
  Filled 2020-12-24 (×2): qty 1

## 2020-12-24 MED ORDER — POLYETHYLENE GLYCOL 3350 17 G PO PACK: 17.0000 g | PACK | Freq: Every day | ORAL | Status: DC | PRN

## 2020-12-24 MED ORDER — EPHEDRINE SULFATE-NACL 50-0.9 MG/10ML-% IV SOSY
PREFILLED_SYRINGE | INTRAVENOUS | Status: DC | PRN
  Administered 2020-12-24: 10 mg via INTRAVENOUS

## 2020-12-24 MED ORDER — OXYCODONE HCL 5 MG PO TABS
ORAL_TABLET | ORAL | Status: AC
  Filled 2020-12-24: qty 1

## 2020-12-24 MED ORDER — SUGAMMADEX SODIUM 200 MG/2ML IV SOLN
INTRAVENOUS | Status: DC | PRN
  Administered 2020-12-24: 200 mg via INTRAVENOUS

## 2020-12-24 MED ORDER — APIXABAN 5 MG PO TABS
5.0000 mg | ORAL_TABLET | Freq: Two times a day (BID) | ORAL | 0 refills | Status: DC
  Filled 2020-12-24: qty 60, 30d supply, fill #0

## 2020-12-24 MED ORDER — FENTANYL CITRATE (PF) 250 MCG/5ML IJ SOLN
INTRAMUSCULAR | Status: DC | PRN
  Administered 2020-12-24: 250 ug via INTRAVENOUS

## 2020-12-24 MED ORDER — LACTATED RINGERS IV SOLN: INTRAVENOUS | Status: DC | PRN

## 2020-12-24 MED ORDER — PHENYLEPHRINE 40 MCG/ML (10ML) SYRINGE FOR IV PUSH (FOR BLOOD PRESSURE SUPPORT)
PREFILLED_SYRINGE | INTRAVENOUS | Status: AC
  Filled 2020-12-24: qty 10

## 2020-12-24 MED ORDER — ONDANSETRON HCL 4 MG/2ML IJ SOLN
INTRAMUSCULAR | Status: DC | PRN
  Administered 2020-12-24: 4 mg via INTRAVENOUS

## 2020-12-24 MED ORDER — CEFAZOLIN SODIUM-DEXTROSE 2-4 GM/100ML-% IV SOLN
2.0000 g | INTRAVENOUS | Status: AC
  Administered 2020-12-24: 2 g via INTRAVENOUS

## 2020-12-24 MED ORDER — LISINOPRIL 10 MG PO TABS
10.0000 mg | ORAL_TABLET | Freq: Every day | ORAL | Status: DC
  Administered 2020-12-24 – 2020-12-25 (×2): 10 mg via ORAL
  Filled 2020-12-24 (×2): qty 1

## 2020-12-24 MED ORDER — DOCUSATE SODIUM 100 MG PO CAPS
100.0000 mg | ORAL_CAPSULE | Freq: Two times a day (BID) | ORAL | Status: DC
  Administered 2020-12-24: 100 mg via ORAL
  Filled 2020-12-24 (×2): qty 1

## 2020-12-24 MED ORDER — TRANEXAMIC ACID-NACL 1000-0.7 MG/100ML-% IV SOLN
1000.0000 mg | Freq: Once | INTRAVENOUS | Status: AC
  Administered 2020-12-24: 1000 mg via INTRAVENOUS
  Filled 2020-12-24: qty 100

## 2020-12-24 MED ORDER — DULOXETINE HCL 60 MG PO CPEP
60.0000 mg | ORAL_CAPSULE | Freq: Every day | ORAL | Status: DC
  Administered 2020-12-24 – 2020-12-25 (×2): 60 mg via ORAL
  Filled 2020-12-24 (×2): qty 1

## 2020-12-24 MED ORDER — MIDAZOLAM HCL 2 MG/2ML IJ SOLN
INTRAMUSCULAR | Status: AC
  Filled 2020-12-24: qty 2

## 2020-12-24 MED ORDER — PROPOFOL 10 MG/ML IV BOLUS
INTRAVENOUS | Status: DC | PRN
  Administered 2020-12-24: 50 mg via INTRAVENOUS
  Administered 2020-12-24: 30 mg via INTRAVENOUS
  Administered 2020-12-24: 120 mg via INTRAVENOUS

## 2020-12-24 MED ORDER — ORAL CARE MOUTH RINSE: 15.0000 mL | Freq: Once | OROMUCOSAL | Status: AC

## 2020-12-24 MED ORDER — METHOCARBAMOL 500 MG PO TABS: 500.0000 mg | ORAL_TABLET | Freq: Four times a day (QID) | ORAL | Status: DC | PRN

## 2020-12-24 MED ORDER — METOPROLOL SUCCINATE ER 25 MG PO TB24
25.0000 mg | ORAL_TABLET | Freq: Every day | ORAL | Status: DC
  Administered 2020-12-24 – 2020-12-25 (×3): 25 mg via ORAL
  Filled 2020-12-24 (×4): qty 1

## 2020-12-24 MED ORDER — MIDAZOLAM HCL 2 MG/2ML IJ SOLN: 0.5000 mg | Freq: Once | INTRAMUSCULAR | Status: DC | PRN

## 2020-12-24 MED ORDER — METHOCARBAMOL 1000 MG/10ML IJ SOLN
750.0000 mg | Freq: Four times a day (QID) | INTRAVENOUS | Status: DC | PRN
  Filled 2020-12-24: qty 7.5

## 2020-12-24 MED ORDER — ROCURONIUM BROMIDE 10 MG/ML (PF) SYRINGE
PREFILLED_SYRINGE | INTRAVENOUS | Status: DC | PRN
  Administered 2020-12-24: 60 mg via INTRAVENOUS
  Administered 2020-12-24: 20 mg via INTRAVENOUS

## 2020-12-24 MED ORDER — DEXAMETHASONE SODIUM PHOSPHATE 10 MG/ML IJ SOLN
INTRAMUSCULAR | Status: DC | PRN
  Administered 2020-12-24: 10 mg via INTRAVENOUS

## 2020-12-24 MED ORDER — METHOCARBAMOL 500 MG PO TABS
ORAL_TABLET | ORAL | Status: AC
  Administered 2020-12-24: 500 mg via ORAL
  Filled 2020-12-24: qty 1

## 2020-12-24 MED ORDER — LIDOCAINE 2% (20 MG/ML) 5 ML SYRINGE
INTRAMUSCULAR | Status: DC | PRN
  Administered 2020-12-24: 40 mg via INTRAVENOUS

## 2020-12-24 MED ORDER — ONDANSETRON HCL 4 MG PO TABS: 4.0000 mg | ORAL_TABLET | Freq: Four times a day (QID) | ORAL | Status: DC | PRN

## 2020-12-24 MED ORDER — HYDROMORPHONE HCL 1 MG/ML IJ SOLN
0.2500 mg | INTRAMUSCULAR | Status: DC | PRN
  Administered 2020-12-24 (×2): 0.5 mg via INTRAVENOUS

## 2020-12-24 SURGICAL SUPPLY — 51 items
BAG COUNTER SPONGE SURGICOUNT (BAG) IMPLANT
BIT DRILL CANN STP 6/9 HIP (BIT) ×2 IMPLANT
BIT DRILL INTERTAN LAG SCREW (BIT) ×2 IMPLANT
BIT DRILL LONG 4.0 (BIT) ×1 IMPLANT
BRUSH SCRUB EZ PLAIN DRY (MISCELLANEOUS) ×2 IMPLANT
CHLORAPREP W/TINT 26 (MISCELLANEOUS) ×2 IMPLANT
COVER PERINEAL POST (MISCELLANEOUS) ×2 IMPLANT
COVER SURGICAL LIGHT HANDLE (MISCELLANEOUS) ×2 IMPLANT
DERMABOND ADVANCED (GAUZE/BANDAGES/DRESSINGS) ×1
DERMABOND ADVANCED .7 DNX12 (GAUZE/BANDAGES/DRESSINGS) ×1 IMPLANT
DRAPE C-ARM 35X43 STRL (DRAPES) ×2 IMPLANT
DRAPE IMP U-DRAPE 54X76 (DRAPES) ×4 IMPLANT
DRAPE INCISE IOBAN 66X45 STRL (DRAPES) ×2 IMPLANT
DRAPE STERI IOBAN 125X83 (DRAPES) ×2 IMPLANT
DRAPE SURG 17X23 STRL (DRAPES) ×4 IMPLANT
DRAPE U-SHAPE 47X51 STRL (DRAPES) ×2 IMPLANT
DRESSING MEPILEX FLEX 4X4 (GAUZE/BANDAGES/DRESSINGS) ×1 IMPLANT
DRILL BIT LONG 4.0 (BIT) ×2
DRSG MEPILEX BORDER 4X4 (GAUZE/BANDAGES/DRESSINGS) ×2 IMPLANT
DRSG MEPILEX BORDER 4X8 (GAUZE/BANDAGES/DRESSINGS) ×2 IMPLANT
DRSG MEPILEX FLEX 4X4 (GAUZE/BANDAGES/DRESSINGS) ×2
ELECT REM PT RETURN 9FT ADLT (ELECTROSURGICAL) ×2
ELECTRODE REM PT RTRN 9FT ADLT (ELECTROSURGICAL) ×1 IMPLANT
EXTRACTOR CONICAL BOLT FOR TFN (ORTHOPEDIC DISPOSABLE SUPPLIES) ×2 IMPLANT
GLOVE SURG ENC MOIS LTX SZ6.5 (GLOVE) ×6 IMPLANT
GLOVE SURG ENC MOIS LTX SZ7.5 (GLOVE) ×8 IMPLANT
GLOVE SURG UNDER POLY LF SZ6.5 (GLOVE) ×2 IMPLANT
GLOVE SURG UNDER POLY LF SZ7.5 (GLOVE) ×2 IMPLANT
GOWN STRL REUS W/ TWL LRG LVL3 (GOWN DISPOSABLE) ×1 IMPLANT
GOWN STRL REUS W/TWL LRG LVL3 (GOWN DISPOSABLE) ×1
GUIDE PIN 3.2X343 (PIN) ×2
GUIDE PIN 3.2X343MM (PIN) ×2
GUIDE ROD 3.0 (MISCELLANEOUS) ×2
KIT BASIN OR (CUSTOM PROCEDURE TRAY) ×2 IMPLANT
KIT TURNOVER KIT B (KITS) ×2 IMPLANT
MANIFOLD NEPTUNE II (INSTRUMENTS) ×2 IMPLANT
NAIL INTERTAN 10X18 130D 10S (Nail) ×2 IMPLANT
NS IRRIG 1000ML POUR BTL (IV SOLUTION) ×2 IMPLANT
PACK GENERAL/GYN (CUSTOM PROCEDURE TRAY) ×2 IMPLANT
PAD ARMBOARD 7.5X6 YLW CONV (MISCELLANEOUS) ×4 IMPLANT
PIN GUIDE 3.2X343MM (PIN) ×2 IMPLANT
ROD GUIDE 3.0 (MISCELLANEOUS) ×1 IMPLANT
SCREW LAG COMPR KIT 105/100 (Screw) ×2 IMPLANT
SCREW TRIGEN LOW PROF 5.0X37.5 (Screw) ×2 IMPLANT
SUT MNCRL AB 3-0 PS2 18 (SUTURE) ×2 IMPLANT
SUT VIC AB 0 CT1 27 (SUTURE) ×1
SUT VIC AB 0 CT1 27XBRD ANBCTR (SUTURE) ×1 IMPLANT
SUT VIC AB 2-0 CT1 27 (SUTURE) ×2
SUT VIC AB 2-0 CT1 TAPERPNT 27 (SUTURE) ×2 IMPLANT
TOWEL GREEN STERILE (TOWEL DISPOSABLE) ×4 IMPLANT
WATER STERILE IRR 1000ML POUR (IV SOLUTION) ×2 IMPLANT

## 2020-12-24 NOTE — Transfer of Care (Signed)
Immediate Anesthesia Transfer of Care Note  Patient: Thomas [name]  Procedure(s) Performed: REVISION FIXATION OF RIGHT INTERTROCHANTERIC FEMUR NONUNION (Right)  Patient Location: PACU  Anesthesia Type:General  Level of Consciousness: awake, alert  and drowsy  Airway & Oxygen Therapy: Patient Spontanous Breathing  Post-op Assessment: Report given to RN, Post -op Vital signs reviewed and stable and Patient moving all extremities X 4  Post vital signs: Reviewed and stable  Last Vitals:  Vitals Value Taken Time  BP 154/86 12/24/20 0951  Temp    Pulse 65 12/24/20 0952  Resp 12 12/24/20 0952  SpO2 100 % 12/24/20 0952  Vitals shown include unvalidated device data.  Last Pain:  Vitals:   12/24/20 0608  TempSrc: Oral  PainSc:          Complications: No notable events documented.

## 2020-12-24 NOTE — Anesthesia Postprocedure Evaluation (Signed)
Anesthesia Post Note  Patient: Thomas [name]  Procedure(s) Performed: REVISION FIXATION OF RIGHT INTERTROCHANTERIC FEMUR NONUNION (Right)     Patient location during evaluation: PACU Anesthesia Type: General Level of consciousness: awake and alert Pain management: pain level controlled Vital Signs Assessment: post-procedure vital signs reviewed and stable Respiratory status: spontaneous breathing, nonlabored ventilation and respiratory function stable Cardiovascular status: blood pressure returned to baseline and stable Postop Assessment: no apparent nausea or vomiting Anesthetic complications: no   No notable events documented.  Last Vitals:  Vitals:   12/24/20 0950 12/24/20 1005  BP:  (!) 156/82  Pulse:  65  Resp:  11  Temp: 36.6 C   SpO2:  99%    Last Pain:  Vitals:   12/24/20 1035  TempSrc:   PainSc: Thomas [name]

## 2020-12-24 NOTE — H&P (Signed)
Orthopaedic Trauma Service (OTS) Consult   Patient ID: Thomas [name] MRN: [id number] DOB/AGE: March 02, 1951 70 y.o.  Reason for Surgery: Repair of right hip nonunion  HPI: Thomas [name] is an 70 y.o. male who presents for repair of his right hip nonunion.  Patient underwent a cephalomedullary nailing in January 2022.  He subsequently was doing well however he fell approximately 15 feet at work and landed on his right hip.  He had immediate pain afterwards.  X-rays had shown what appeared to be stable fixation however he continued to walk and have significant pain.  CT scan was eventually obtained which showed fractured hardware with some varus angulation of his hip fracture.  I continue to try to heal this with nonoperative management however he continued to have pain with inability bear weight.  As result I felt that he was indicated for revision fixation of his hip.  Past Medical History:  Diagnosis Date   Atrial flutter ([place]) 01/2020   Cancer ([place])    prostate   Heart failure with reduced ejection fraction (HCC)    Hypertension    Hypoglycemia    occ   NICM (nonischemic cardiomyopathy) ([place]) 02/12/2020   Prostate cancer ([place]) 2019   Rheumatoid arthritis [facility])     Past Surgical History:  Procedure Laterality Date   AMPUTATION TOE Right 12/2019   AMPUTATION TOE Right 03/26/2020   Procedure: Right 3rd toe partial amputation, bone biopsy right 1st metatarsal;  Surgeon: Evelina Bucy, DPM;  [facility];  Service: Podiatry;  Laterality: Right;   BUBBLE STUDY  02/11/2020   Procedure: BUBBLE STUDY;  Surgeon: Geralynn Rile, MD;  Location: [place];  Service: Cardiovascular;;   CARDIOVERSION N/A 02/11/2020   Procedure: CARDIOVERSION;  Surgeon: Geralynn Rile, MD;  Location: [place];  Service: Cardiovascular;  Laterality: N/A;   CYSTOSCOPY  04/11/2018   Procedure: Erlene Quan;  Surgeon: [name], MD;  Location: [facility];   Service: Urology;;  NO SEEDS FOUND IN BLADDER   HERNIA REPAIR  2009   inguinal   INTRAMEDULLARY (IM) NAIL INTERTROCHANTERIC Right 05/05/2020   Procedure: CPT 27245-Cephalomedullary nailing of right intertrochanteric femur fracture;  Surgeon: Shona Needles, MD;  Location: [place];  Service: Orthopedics;  Laterality: Right;   IRRIGATION AND DEBRIDEMENT FOOT Left 03/26/2020   Procedure: Left foot incision and drainage with removal of all non-viable soft tissue and bone - areas overlying the 2nd and 5th metatarsals.;  Surgeon: Evelina Bucy, DPM;  [facility];  Service: Podiatry;  Laterality: Left;   RADIOACTIVE SEED IMPLANT N/A 04/11/2018   Procedure: RADIOACTIVE SEED IMPLANT/BRACHYTHERAPY IMPLANT;  Surgeon: [name], MD;  Location: [facility];  Service: Urology;  Laterality: N/A;   95     SEEDS IMPLANTED   RIGHT/LEFT HEART CATH AND CORONARY ANGIOGRAPHY N/A 02/09/2020   Procedure: RIGHT/LEFT HEART CATH AND CORONARY ANGIOGRAPHY;  Surgeon: Nelva Bush, MD;  Location: [place] [facility];  Service: Cardiovascular;  Laterality: N/A;   SPACE OAR INSTILLATION N/A 04/11/2018   Procedure: SPACE OAR INSTILLATION;  Surgeon: [name], MD;  Location[facility];  Service: Urology;  Laterality: N/A;   TEE WITHOUT CARDIOVERSION N/A 02/11/2020   Procedure: TRANSESOPHAGEAL ECHOCARDIOGRAM (TEE);  Surgeon: Geralynn Rile, MD;  Location: [place];  Service: Cardiovascular;  Laterality: N/A;   TOTAL KNEE ARTHROPLASTY Bilateral 09/13/2015   Procedure: BILATERAL KNEE ARTHROPLASTY ;  Surgeon: Paralee Cancel, MD;  [facility];  Service: Orthopedics;  Laterality: Bilateral;    Family History  Problem Relation Age of Onset   Prostate cancer Father    Prostate cancer Brother    Colon cancer Neg Hx    Rectal cancer Neg Hx    Stomach cancer Neg Hx     Social History:  reports that he has never smoked. He has never used smokeless tobacco. He  reports current alcohol use of about 7.0 standard drinks per week. He reports that he does not currently use drugs.  Allergies:  Allergies  Allergen Reactions   Diltiazem Hcl Swelling   Chlorthalidone Other (See Comments)    "Makes me light-headed and I don't like the way it makes me feel"   Voltaren [Diclofenac] Rash    Medications:  No current facility-administered medications on file prior to encounter.   Current Outpatient Medications on File Prior to Encounter  Medication Sig Dispense Refill   amiodarone (PACERONE) 200 MG tablet Take 1 tablet (200 mg total) by mouth daily. 90 tablet 3   aspirin-acetaminophen-caffeine (EXCEDRIN MIGRAINE) 250-250-65 MG tablet Take 1-2 tablets by mouth every 6 (six) hours as needed (pain).     diazepam (VALIUM) 5 MG tablet 5 mg at bedtime as needed for bladder spasms. Use rectally     DULoxetine (CYMBALTA) 60 MG capsule TAKE 1 CAPSULE BY MOUTH EVERY DAY - Pls make an appointment for an office visit 30 capsule 1   lisinopril (ZESTRIL) 10 MG tablet Take 1 tablet (10 mg total) by mouth daily. 90 tablet 3   metoprolol succinate (TOPROL XL) 25 MG 24 hr tablet Take 0.5 tablets (12.5 mg total) by mouth daily. 45 tablet 3   oxyCODONE-acetaminophen (PERCOCET) 10-325 MG tablet Take 1 tablet by mouth every 6 (six) hours as needed for pain. 30 tablet 0   predniSONE (DELTASONE) 5 MG tablet Take 5 mg by mouth daily with breakfast.     testosterone cypionate (DEPOTESTOSTERONE CYPIONATE) 200 MG/ML injection Inject 0.5 mLs into the muscle once a week.     NARCAN 4 MG/0.1ML LIQD nasal spray kit Place 1 spray into the nose as needed (as directed for emergency). 2 each 0     ROS: Constitutional: No fever or chills Vision: No changes in vision ENT: No difficulty swallowing CV: No chest pain Pulm: No SOB or wheezing GI: No nausea or vomiting GU: No urgency or inability to hold urine Skin: No poor wound healing Neurologic: No numbness or tingling Psychiatric: No  depression or anxiety Heme: No bruising Allergic: No reaction to medications or food   Exam: Blood pressure (!) 187/90, pulse 64, temperature 97.8 F (36.6 C), temperature source Oral, resp. rate 18, height _0  (2.032 m), weight 99.8 kg, SpO2 95 %. General: No acute distress Orientation: Awake alert and oriented x3 Mood and Affect: Cooperative and pleasant Gait: Significantly antalgic gait  Coordination and balance: Within normal limits  Right lower extremity: Pain with range of motion and ambulating.  Neurovascularly intact.  Surgical incisions are healed.  Left lower extremity: Skin without lesions. No tenderness to palpation. Full painless ROM, full strength in each muscle groups without evidence of instability.   Medical Decision Making: Data: Imaging: X-rays and CT scan are reviewed which shows a fractured short nail with varus angulation of his intertrochanteric femur fracture.  Labs:  Results for orders placed or performed during the hospital encounter of 12/24/20 (from the past 24 hour(s))  SARS Coronavirus 2 by RT PCR (hospital order, performed in [facility] hospital lab)  Nasopharyngeal Nasopharyngeal Swab     Status: None   Collection Time: 12/24/20  5:44 AM   Specimen: Nasopharyngeal Swab  Result Value Ref Range   SARS Coronavirus 2 NEGATIVE NEGATIVE     Imaging or Labs ordered: None  Medical history and chart was reviewed and case discussed with medical provider.  Assessment/Plan: 70 year old male with right hip nonunion with failed fixation  We will plan to proceed with a hardware removal and correction of alignment and revision fixation.  Risks and benefits were discussed with the patient.  Risks include but not limited to bleeding, infection, continued nonunion, malunion, hardware failure, hardware irritation, nerve or blood vessel injury, DVT, even possibility anesthetic complications.  He agreed to proceed with surgery and consent was obtained.  Shona Needles, MD [facility] [phone] (office) [email or web address]

## 2020-12-24 NOTE — Op Note (Signed)
Orthopaedic Surgery Operative Note (CSN: [id number] ) Date of Surgery: 12/24/2020  Admit Date: 12/24/2020   Diagnoses: Pre-Op Diagnoses: Right intertrochanteric femoral nonunion  Post-Op Diagnosis: Same  Procedures: CPT 27470-Revision fixation of right intertrochanteric femoral nonunion CPT 20680-Removal of hardware right femur  Surgeons : Primary: Shona Needles, MD  Assistant: Patrecia Pace, PA-C  Location: OR 3   Anesthesia:General   Antibiotics: Ancef 2g preop   Tourniquet time:None    Estimated Blood 123XX123 mL  Complications:None  Specimens:None   Implants: Implant Name Type Inv. Item Serial No. Manufacturer Lot No. LRB No. Used Action  NAIL INTERTAN 10X18 130D 10S - [id number] Nail NAIL INTERTAN 10X18 130D 10S  [employer] [id number] Right 1 Implanted  SCREW LAG DUAL 105/100 - [id number] Screw SCREW LAG DUAL 105/100  [facility] [employer] [id number] Right 1 Implanted  SCREW TRIGEN LOW PROF 5.0X37.5 - [id number] Screw SCREW TRIGEN LOW PROF 5.0X37.5  [employer] [id number] Right 1 Implanted     Indications for Surgery: 70 year old male who sustained a right intertrochanteric femur fracture in January 2022.  He underwent cephalomedullary nailing.  He subsequently was doing well and then fell 15 feet from a scaffolding.  He was found to have fractured hardware that eventually went on to nonunion of his intertrochanteric region with varus angulation.  Due to the continued pain and unstable nature of his injury I recommended proceeding with hardware removal and revision fixation of his right intertrochanteric femur.  Risks and benefits were discussed with the patient.  Risks include but not limited to bleeding, infection, malunion, nonunion, hardware failure, hardware irritation, nerve or blood vessel injur DVT, and even the possibility anesthetic complications.  He agrees to proceed with surgery and consent was obtained.  Operative  Findings: 1.  Removal of previous Synthes TFNa 2.  Revision fixation of right intertrochanteric femur nonunion treated with a [employer] 10 x 180 mm InterTAN nail  Procedure: The patient was identified in the preoperative holding area. Consent was confirmed with the patient and their family and all questions were answered. The operative extremity was marked after confirmation with the patient. he was then brought back to the operating room by our anesthesia colleagues.  He was placed under general anesthetic and carefully transferred over to a Hana table.  All bony prominences were well-padded.  The right lower extremity was then prepped and draped in usual sterile fashion.  A timeout was performed to verify the patient, procedure, and the extremity.  Preoperative antibiotics were dosed.  Fluoroscopic imaging showed the fractured hardware with the varus angulation of his hip.  My for started out by reopening the nail insertion incision.  I spread down to the tip of the greater trochanter.  I used a threaded guidewire to find the cannulation of the femoral nail.  I then was able to thread a conical extraction device into the top part of the nail.  Once I had this in place I then turned my attention to the lag screw.  I reopened the lateral incision along the femur.  Using the extraction device I was able to thread in the lag screw and I was able to remove this without difficulty.  I then removed the proximal portion of the nail.  I then used a 6.0 mm Schanz pin and advanced this in the cannulation of the distal portion of the broken nail.  Once I had good tight fit of this I then removed the distal interlocking I was  able to extract the distal portion of the nail.  This point I needed to get the patient's fracture/nonunion out of varus.  More traction was applied through the table.  I then used an osteotome to go through the nail insertion site along the anterior and posterior aspect of the  intertrochanteric fracture plane to assist with breaking up some of the scar tissue and healing.  After this I was able to get it back in a decent alignment.  I then chose to use an InterTAN 10 x 180 mm nail.  This was passed down the center of the canal.  I then directed a threaded guidewire into the head/neck segment.  I measured the length and chose to use 105 mm lag screw.  I then drilled the path for the compression screw.  I then drilled the path for the lag screw.  The lag screw was placed.  The compression screw was placed and the device was statically locked.  Then placed the distal interlock screw to prevent rotation.  Final fluoroscopic imaging was obtained.  The incisions were copiously irrigated.  A layer closure of 2-0 Vicryl and 3-0 Monocryl Dermabond was used to close the skin.  Sterile dressings were applied.  Patient was then awoken from anesthesia and taken to the PACU in stable condition.  Post Op Plan/Instructions: Patient will be 50% partial weightbearing through the right lower extremity.  He will receive postoperative Ancef.  He will be restarted on his Eliquis if his hemoglobin is stable postoperative day 1.  We will mobilize him with physical and Occupational Therapy.  I was present and performed the entire surgery.  Patrecia Pace, PA-C did assist me throughout the case. An assistant was necessary given the difficulty in approach, maintenance of reduction and ability to instrument the fracture.   Katha Hamming, MD [facility]

## 2020-12-24 NOTE — Anesthesia Procedure Notes (Signed)
Procedure Name: Intubation Date/Time: 12/24/2020 7:35 AM Performed by: Harden Mo, CRNA Pre-anesthesia Checklist: Patient identified, Emergency Drugs available, Suction available and Patient being monitored Patient Re-evaluated:Patient Re-evaluated prior to induction Oxygen Delivery Method: Circle System Utilized Preoxygenation: Pre-oxygenation with 100% oxygen Induction Type: IV induction Ventilation: Mask ventilation without difficulty Laryngoscope Size: Glidescope and 4 Grade View: Grade I Tube type: Oral Tube size: 7.5 mm Number of attempts: 1 Airway Equipment and Method: Stylet and Oral airway Placement Confirmation: ETT inserted through vocal cords under direct vision, positive ETCO2 and breath sounds checked- equal and bilateral Secured at: 23 cm Tube secured with: Tape Dental Injury: Teeth and Oropharynx as per pre-operative assessment

## 2020-12-25 LAB — BASIC METABOLIC PANEL
Anion gap: 8 (ref 5–15)
BUN: 17 mg/dL (ref 8–23)
CO2: 25 mmol/L (ref 22–32)
Calcium: 8.4 mg/dL — ABNORMAL LOW (ref 8.9–10.3)
Chloride: 104 mmol/L (ref 98–111)
Creatinine, Ser: 0.84 mg/dL (ref 0.61–1.24)
GFR, Estimated: >60 mL/min (ref >60)
Glucose, Bld: 119 mg/dL — ABNORMAL HIGH (ref 70–99)
Potassium: 4.2 mmol/L (ref 3.5–5.1)
Sodium: 137 mmol/L (ref 135–145)

## 2020-12-25 LAB — CBC
HCT: 28.9 % — ABNORMAL LOW (ref 39.0–52.0)
Hemoglobin: 9.1 g/dL — ABNORMAL LOW (ref 13.0–17.0)
MCH: 25.7 pg — ABNORMAL LOW (ref 26.0–34.0)
MCHC: 31.5 g/dL (ref 30.0–36.0)
MCV: 81.6 fL (ref 80.0–100.0)
Platelets: 280 10*3/uL (ref 150–400)
RBC: 3.54 MIL/uL — ABNORMAL LOW (ref 4.22–5.81)
RDW: 14.7 % (ref 11.5–15.5)
WBC: 7.7 10*3/uL (ref 4.0–10.5)
nRBC: 0 % (ref 0.0–0.2)

## 2020-12-25 MED ORDER — APIXABAN 5 MG PO TABS
5.0000 mg | ORAL_TABLET | Freq: Two times a day (BID) | ORAL | Status: DC
  Administered 2020-12-25: 5 mg via ORAL
  Filled 2020-12-25: qty 1

## 2020-12-25 NOTE — Evaluation (Signed)
Physical Therapy Evaluation and Discharge Patient Details Name: Thomas [name] MRN: [id number] DOB: [birth year] Today's Date: 12/25/2020   History of Present Illness  70 y.o. male s/p REVISION FIXATION OF RIGHT INTERTROCHANTERIC FEMUR NONUNION.  Clinical Impression  Patient evaluated by Physical Therapy with no further acute PT needs identified. All education has been completed and the patient has no further questions. Ambulates in hallway with rolling walker, compliant with 50% WB status, no evidence of LOB when using device. Pt with good family support at home. Declines need for HHPT and has needed equipment in home from previous surgeries. See below for any follow-up Physical Therapy or equipment needs. PT is signing off. Thank you for this referral.     Follow Up Recommendations Outpatient PT    Equipment Recommendations  None recommended by PT    Recommendations for Other Services       Precautions / Restrictions Precautions Precautions: Fall Restrictions Weight Bearing Restrictions: Yes RLE Weight Bearing: Partial weight bearing RLE Partial Weight Bearing Percentage or Pounds: 50%      Mobility  Bed Mobility Overal bed mobility: Modified Independent                  Transfers Overall transfer level: Modified independent                  Ambulation/Gait Ambulation/Gait assistance: Supervision Gait Distance (Feet): 65 Feet Assistive device: Rolling walker (2 wheeled) Gait Pattern/deviations: Step-to pattern;Trunk flexed Gait velocity: decreased   General Gait Details: Educated on safe DME use with RW. Cues for sequencing, 50% WB through RLE, managed well. Cues to stay within RW with turn for safety. Supervision without any episodes of LOB during bout. Slow and antalgic as expected post-op. States he could walk further but wants to save energy for trip home.  Stairs            Wheelchair Mobility    Modified Rankin (Stroke Patients Only)        Balance Overall balance assessment: Needs assistance Sitting-balance support: No upper extremity supported Sitting balance-Leahy Scale: Normal     Standing balance support: No upper extremity supported Standing balance-Leahy Scale: Fair                               Pertinent Vitals/Pain Pain Assessment: 0-10 Pain Score: 8  Pain Location: Rt hip Pain Descriptors / Indicators: Aching Pain Intervention(s): Monitored during session;Repositioned;Premedicated before session;Limited activity within patient's tolerance    Home Living Family/patient expects to be discharged to:: Private residence Living Arrangements: Spouse/significant other Available Help at Discharge: Family;Available 24 hours/day Type of Home: House Home Access: Level entry     Home Layout: One level Home Equipment: Walker - 2 wheels;Bedside commode;Grab bars - tub/shower;Cane - single point;Grab bars - toilet Additional Comments: lives with wife and son - wife no longer visually impaired (as previously noted on prior admission.)    Prior Function Level of Independence: Independent               Hand Dominance   Dominant Hand: Right    Extremity/Trunk Assessment   Upper Extremity Assessment Upper Extremity Assessment: Defer to OT evaluation    Lower Extremity Assessment Lower Extremity Assessment: RLE deficits/detail RLE Deficits / Details: guarding due to pain, limited due to WB status       Communication   Communication: No difficulties  Cognition Arousal/Alertness: Awake/alert Behavior During Therapy: [facility] for tasks  assessed/performed Overall Cognitive Status: Within Functional Limits for tasks assessed                                        General Comments General comments (skin integrity, edema, etc.): Reviewed precautions, all questions answered.    Exercises     Assessment/Plan    PT Assessment Patent does not need any further PT services  PT  Problem List Decreased strength;Decreased range of motion;Decreased activity tolerance;Decreased balance;Decreased mobility;Pain       PT Treatment Interventions DME instruction;Gait training;Functional mobility training;Patient/family education    PT Goals (Current goals can be found in the Care Plan section)  Acute Rehab PT Goals Patient Stated Goal: Go home soon, recover and get back to work. PT Goal Formulation: All assessment and education complete, DC therapy    Frequency     Barriers to discharge        Co-evaluation               AM-PAC PT "6 Clicks" Mobility  Outcome Measure Help needed turning from your back to your side while in a flat bed without using bedrails?: None Help needed moving from lying on your back to sitting on the side of a flat bed without using bedrails?: None Help needed moving to and from a bed to a chair (including a wheelchair)?: None Help needed standing up from a chair using your arms (e.g., wheelchair or bedside chair)?: None Help needed to walk in hospital room?: None Help needed climbing 3-5 steps with a railing? : A Little 6 Click Score: 23    End of Session Equipment Utilized During Treatment: Gait belt Activity Tolerance: Patient tolerated treatment well Patient left: in bed;with call bell/phone within reach   PT Visit Diagnosis: Unsteadiness on feet (R26.81);Difficulty in walking, not elsewhere classified (R26.2);Pain Pain - Right/Left: Right Pain - part of body: Hip    Time: [id number] PT Time Calculation (min) (ACUTE ONLY): 26 min   Charges:   PT Evaluation $PT Eval Low Complexity: 1 Low PT Treatments $Gait Training: 8-22 mins        Candie Mile, PT, DPT  Ellouise Newer 12/25/2020, 9:15 AM

## 2020-12-25 NOTE — Plan of Care (Signed)

## 2020-12-25 NOTE — Discharge Instructions (Addendum)
[facility] Discharge Instructions   General Discharge Instructions  WEIGHT BEARING STATUS: PARTIAL WEIGHT BEARING - 50% OF YOUR WEIGHT  Wound Care: Leave your dressing on for three days. Keep dressings clean and dry. You may remove the dressing after three days. Incisions can be left open to air if there is no drainage. If incision continues to have drainage, follow wound care instructions below. Okay to shower if no drainage from incisions.  DVT/PE prophylaxis: Eliquis  Diet: as you were eating previously.  Can use over the counter stool softeners and bowel preparations, such as Miralax, to help with bowel movements.  Narcotics can be constipating.  Be sure to drink plenty of fluids  PAIN MEDICATION USE AND EXPECTATIONS  You have likely been given narcotic medications to help control your pain.  After a traumatic event that results in an fracture (broken bone) with or without surgery, it is ok to use narcotic pain medications to help control one's pain.  We understand that everyone responds to pain differently and each individual patient will be evaluated on a regular basis for the continued need for narcotic medications. Ideally, narcotic medication use should last no more than 6-8 weeks (coinciding with fracture healing).   As a patient it is your responsibility as well to monitor narcotic medication use and report the amount and frequency you use these medications when you come to your office visit.   We would also advise that if you are using narcotic medications, you should take a dose prior to therapy to maximize you participation.  IF YOU ARE ON NARCOTIC MEDICATIONS IT IS NOT PERMISSIBLE TO OPERATE A MOTOR VEHICLE (MOTORCYCLE/CAR/TRUCK/MOPED) OR HEAVY MACHINERY DO NOT MIX NARCOTICS WITH OTHER CNS (CENTRAL NERVOUS SYSTEM) DEPRESSANTS SUCH AS ALCOHOL   STOP SMOKING OR USING NICOTINE PRODUCTS!!!!  As discussed nicotine severely impairs your body's ability to heal  surgical and traumatic wounds but also impairs bone healing.  Wounds and bone heal by forming microscopic blood vessels (angiogenesis) and nicotine is a vasoconstrictor (essentially, shrinks blood vessels).  Therefore, if vasoconstriction occurs to these microscopic blood vessels they essentially disappear and are unable to deliver necessary nutrients to the healing tissue.  This is one modifiable factor that you can do to dramatically increase your chances of healing your injury.    (This means no smoking, no nicotine gum, patches, etc)  DO NOT USE NONSTEROIDAL ANTI-INFLAMMATORY DRUGS (NSAID'S)  Using products such as Advil (ibuprofen), Aleve (naproxen), Motrin (ibuprofen) for additional pain control during fracture healing can delay and/or prevent the healing response.  If you would like to take over the counter (OTC) medication, Tylenol (acetaminophen) is ok.  However, some narcotic medications that are given for pain control contain acetaminophen as well. Therefore, you should not exceed more than 4000 mg of tylenol in a day if you do not have liver disease.  Also note that there are may OTC medicines, such as cold medicines and allergy medicines that my contain tylenol as well.  If you have any questions about medications and/or interactions please ask your doctor/PA or your pharmacist.      ICE AND ELEVATE INJURED/OPERATIVE EXTREMITY  Using ice and elevating the injured extremity above your heart can help with swelling and pain control.  Icing in a pulsatile fashion, such as 20 minutes on and 20 minutes off, can be followed.    Do not place ice directly on skin. Make sure there is a barrier between to skin and the ice pack.  Using frozen items such as frozen peas works well as the conform nicely to the are that needs to be iced.  USE AN ACE WRAP OR TED HOSE FOR SWELLING CONTROL  In addition to icing and elevation, Ace wraps or TED hose are used to help limit and resolve swelling.  It is  recommended to use Ace wraps or TED hose until you are informed to stop.    When using Ace Wraps start the wrapping distally (farthest away from the body) and wrap proximally (closer to the body)   Example: If you had surgery on your leg or thing and you do not have a splint on, start the ace wrap at the toes and work your way up to the thigh        If you had surgery on your upper extremity and do not have a splint on, start the ace wrap at your fingers and work your way up to the upper arm  IF YOU ARE IN A SPLINT OR CAST DO NOT [place]   If your splint gets wet for any reason please contact the office immediately. You may shower in your splint or cast as long as you keep it dry.  This can be done by wrapping in a cast cover or garbage back (or similar)  Do Not stick any thing down your splint or cast such as pencils, money, or hangers to try and scratch yourself with.  If you feel itchy take benadryl as prescribed on the bottle for itching  IF YOU ARE IN A CAM BOOT (BLACK BOOT)  You may remove boot periodically. Perform daily dressing changes as noted below.  Wash the liner of the boot regularly and wear a sock when wearing the boot. It is recommended that you sleep in the boot until told otherwise   CALL THE OFFICE WITH ANY QUESTIONS OR CONCERNS: [phone]   VISIT OUR WEBSITE FOR ADDITIONAL INFORMATION: [email or web address]      Discharge Pin Site Instructions  Dress pins daily with Kerlix roll starting on POD 2. Wrap the Kerlix so that it tamps the skin down around the pin-skin interface to prevent/limit motion of the skin relative to the pin.  (Pin-skin motion is the primary cause of pain and infection related to external fixator pin sites).  Remove any crust or coagulum that may obstruct drainage with a saline moistened gauze or soap and water.  After POD 3, if there is no discernable drainage on the pin site dressing, the interval for change can by increased to  every other day.  You may shower with the fixator, cleaning all pin sites gently with soap and water.  If you have a surgical wound this needs to be completely dry and without drainage before showering.  The extremity can be lifted by the fixator to facilitate wound care and transfers.  Notify the office/Doctor if you experience increasing drainage, redness, or pain from a pin site, or if you notice purulent (thick, snot-like) drainage.  Discharge Wound Care Instructions  Do NOT apply any ointments, solutions or lotions to pin sites or surgical wounds.  These prevent needed drainage and even though solutions like hydrogen peroxide kill bacteria, they also damage cells lining the pin sites that help fight infection.  Applying lotions or ointments can keep the wounds moist and can cause them to breakdown and open up as well. This can increase the risk for infection. When in doubt call the office.  Surgical incisions should  be dressed daily.  If any drainage is noted, use one layer of adaptic, then gauze, Kerlix, and an ace wrap.  Once the incision is completely dry and without drainage, it may be left open to air out.  Showering may begin 36-48 hours later.  Cleaning gently with soap and water.  Traumatic wounds should be dressed daily as well.    One layer of adaptic, gauze, Kerlix, then ace wrap.  The adaptic can be discontinued once the draining has ceased    If you have a wet to dry dressing: wet the gauze with saline the squeeze as much saline out so the gauze is moist (not soaking wet), place moistened gauze over wound, then place a dry gauze over the moist one, followed by Kerlix wrap, then ace wrap.   Information on my medicine - ELIQUIS (apixaban)  This medication education was reviewed with me or my healthcare representative as part of my discharge preparation.  Why was Eliquis prescribed for you? Eliquis was prescribed for you to reduce the risk of a blood clot forming that  can cause a stroke if you have a medical condition called atrial fibrillation (a type of irregular heartbeat).  What do You need to know about Eliquis ? Take your Eliquis TWICE DAILY - one tablet in the morning and one tablet in the evening with or without food. If you have difficulty swallowing the tablet whole please discuss with your pharmacist how to take the medication safely.  Take Eliquis exactly as prescribed by your doctor and DO NOT stop taking Eliquis without talking to the doctor who prescribed the medication.  Stopping may increase your risk of developing a stroke.  Refill your prescription before you run out.  After discharge, you should have regular check-up appointments with your healthcare provider that is prescribing your Eliquis.  In the future your dose may need to be changed if your kidney function or weight changes by a significant amount or as you get older.  What do you do if you miss a dose? If you miss a dose, take it as soon as you remember on the same day and resume taking twice daily.  Do not take more than one dose of ELIQUIS at the same time to make up a missed dose.  Important Safety Information A possible side effect of Eliquis is bleeding. You should call your healthcare provider right away if you experience any of the following: Bleeding from an injury or your nose that does not stop. Unusual colored urine (red or dark brown) or unusual colored stools (red or black). Unusual bruising for unknown reasons. A serious fall or if you hit your head (even if there is no bleeding).  Some medicines may interact with Eliquis and might increase your risk of bleeding or clotting while on Eliquis. To help avoid this, consult your healthcare provider or pharmacist prior to using any new prescription or non-prescription medications, including herbals, vitamins, non-steroidal anti-inflammatory drugs (NSAIDs) and supplements.  This website has more information on  Eliquis (apixaban): http://www.eliquis.com/eliquis/home

## 2020-12-25 NOTE — Progress Notes (Signed)
OT Cancellation Note  Patient Details Name: Thomas [name] MRN: [id number] DOB: 10/17/1950   Cancelled Treatment:    Reason Eval/Treat Not Completed: OT screened, no needs identified, will sign off. Per PT, pt up ad lib in room, no safety concerns, moving well, with all DME needed, and good family support. Acute OT will sign off at this time.   Mckade Gurka H., OTR/L Acute Rehabilitation  Dusty Raczkowski Elane Yolanda Bonine 12/25/2020, 9:20 AM

## 2020-12-25 NOTE — Progress Notes (Signed)
Patient walking out into hallway, states he is leaving, the doctor put in his discharge order. Patient made aware by RN that I needed to print his discharge paper work and I will be right in to go over it. Patient stated he already signed his paperwork and he is leaving.  Nursing assistant asked if he wanted a wheel chair to the lobby. Patient stated yes, as CNA was getting wheelchair, paper work was printed.  RN asked patient if he wanted her to review paperwork, patient stated no. Paperwork handed to patient, belongings in his bag and was wheeled off the unit.

## 2020-12-25 NOTE — Progress Notes (Signed)
  ORTHOPAEDIC PROGRESS NOTE  s/p Procedure(s): REVISION FIXATION OF RIGHT INTERTROCHANTERIC FEMUR NONUNION  SUBJECTIVE: Reports mild pain about operative site. Has already worked with therapy today. Ready to discharge home. Has walker at home. Already got his discharge medications from the pharmacy. No chest pain. No SOB. No nausea/vomiting. No other complaints.  OBJECTIVE: PE: RLE: incision CDI, leg lengths equal, warm well perfused foot, intact EHL/TA/GSC  Vitals:   12/24/20 2016 12/25/20 0643  BP: 102/76 138/74  Pulse: 70 (!) 59  Resp: 15 16  Temp: 98.3 F (36.8 C) 98.5 F (36.9 C)  SpO2: 97% 95%     ASSESSMENT: Thomas [name] is a 70 y.o. male doing well postoperatively.  PLAN: Weightbearing: PWB 50%  RLE Insicional and dressing care: Reinforce dressings as needed Orthopedic device(s): None Showering: post-op day 3# VTE prophylaxis: Resume Eliquis Pain control: PRN pain medications Follow - up plan: 2 weeks in office with Dr. Doreatha Martin Dispo: Patient did well with physical therapy this morning. He is ready to go home. Will place discharge orders. Patient feels safe and comfortable with plan. All questions were answered.  Contact information: After hours and holidays please check [email or web address] for group call information for [facility]  Noemi Chapel, PA-C 12/25/2020

## 2020-12-28 ENCOUNTER — Encounter (HOSPITAL_COMMUNITY): Payer: Self-pay | Admitting: Student

## 2020-12-28 ENCOUNTER — Other Ambulatory Visit (HOSPITAL_COMMUNITY): Payer: Self-pay

## 2020-12-28 MED ORDER — OXYCODONE-ACETAMINOPHEN 10-325 MG PO TABS
1.0000 | ORAL_TABLET | ORAL | 0 refills | Status: DC | PRN
  Filled 2020-12-28: qty 42, 7d supply, fill #0

## 2020-12-28 MED ORDER — OXYCODONE HCL 5 MG PO TABS
5.0000 mg | ORAL_TABLET | ORAL | 0 refills | Status: DC | PRN
  Filled 2020-12-28: qty 42, 7d supply, fill #0

## 2020-12-28 NOTE — Discharge Summary (Signed)
Patient ID: Thomas [name] MRN: [id number] DOB/AGE: March 14, 1951 70 y.o.  Admit date: 12/24/2020 Discharge date: 12/25/2020  Admission Diagnoses:right hip nonunion with failed fixation  Discharge Diagnoses:  Principal Problem:   Hip fracture [facility]) Active Problems:   Fracture of intertrochanteric section of femur, closed, right, with nonunion, subsequent encounter   Past Medical History:  Diagnosis Date   Atrial flutter ([place]) 01/2020   Cancer ([place])    prostate   Heart failure with reduced ejection fraction ([place])    Hypertension    Hypoglycemia    occ   NICM (nonischemic cardiomyopathy) ([place]) 02/12/2020   Prostate cancer ([place]) 2019   Rheumatoid arthritis ([place])      Procedures Performed:  - Revision fixation of right intertrochanteric femoral nonunion - Removal of hardware right femur  Discharged Condition: stable  Hospital Course: Patient brought in for scheduled procedure.  He tolerated procedure well.  He was kept for monitoring overnight for pain control, PT evaluations, and medical monitoring postop. He was found to be stable for DC home the morning after surgery.  Patient was instructed on specific activity restrictions and all questions were answered.  Consults: PT/OT  Significant Diagnostic Studies: No additional pertinent studies  Treatments: Surgery  Discharge Exam: General: sitting up in hospital bed, NAD RLE: incision CDI, leg lengths equal, warm well perfused foot, intact EHL/TA/GSC  Disposition: Discharge disposition: 01-Home or Self Care       Discharge Instructions     Call MD for:  redness, tenderness, or signs of infection (pain, swelling, redness, odor or green/yellow discharge around incision site)   Complete by: As directed    Call MD for:  severe uncontrolled pain   Complete by: As directed    Call MD for:  temperature >100.4   Complete by: As directed    Diet - low sodium heart healthy   Complete by: As directed       Allergies as  of 12/25/2020       Reactions   Diltiazem Hcl Swelling   Chlorthalidone Other (See Comments)   "Makes me light-headed and I don't like the way it makes me feel"   Voltaren [diclofenac] Rash        Medication List     STOP taking these medications    aspirin-acetaminophen-caffeine 250-250-65 MG tablet Commonly known as: EXCEDRIN MIGRAINE       TAKE these medications    amiodarone 200 MG tablet Commonly known as: PACERONE Take 1 tablet (200 mg total) by mouth daily.   diazepam 5 MG tablet Commonly known as: VALIUM 5 mg at bedtime as needed for bladder spasms. Use rectally   DULoxetine 60 MG capsule Commonly known as: CYMBALTA TAKE 1 CAPSULE BY MOUTH EVERY DAY - Pls make an appointment for an office visit   Eliquis 5 MG Tabs tablet Generic drug: apixaban Take 1 tablet (5 mg total) by mouth 2 (two) times daily.   lisinopril 10 MG tablet Commonly known as: ZESTRIL Take 1 tablet (10 mg total) by mouth daily.   methocarbamol 500 MG tablet Commonly known as: Robaxin Take 1 tablet (500 mg total) by mouth every 6 (six) hours as needed for muscle spasms.   metoprolol succinate 25 MG 24 hr tablet Commonly known as: Toprol XL Take 0.5 tablets (12.5 mg total) by mouth daily.   Narcan 4 MG/0.1ML Liqd nasal spray kit Generic drug: naloxone Place 1 spray into the nose as needed (as directed for emergency).   oxyCODONE-acetaminophen 10-325 MG tablet  Commonly known as: Percocet Take 1 tablet by mouth every 6 (six) hours as needed for pain.   predniSONE 5 MG tablet Commonly known as: DELTASONE Take 5 mg by mouth daily with breakfast.   testosterone cypionate 200 MG/ML injection Commonly known as: DEPOTESTOSTERONE CYPIONATE Inject 0.5 mLs into the muscle once a week.        Follow-up Information     Haddix, Thomasene Lot, MD. Schedule an appointment as soon as possible for a visit in 2 week(s).   Specialty: Orthopedic Surgery Why: for repeat x-rays, wound check Contact  information: [place] [address] [phone]                 Noemi Chapel, PA-C

## 2020-12-30 ENCOUNTER — Telehealth: Payer: Self-pay | Admitting: Family Medicine

## 2020-12-30 NOTE — Telephone Encounter (Signed)
Spoke with him when I called his wife  He is covid positive with mild symptoms of congestion.  No shortness of breath.  Is double boosted and not on any immunocompromising medications since he just had revision of his hip replacement.  Discussed paxlovid and decided not to use.   I agreed to meet with him to discuss taking over his chronic pain management  Dr Doreatha Martin ortho is prescribing his narcotic currently

## 2020-12-31 ENCOUNTER — Other Ambulatory Visit (HOSPITAL_COMMUNITY): Payer: Self-pay

## 2020-12-31 MED ORDER — OXYCODONE-ACETAMINOPHEN 10-325 MG PO TABS
1.0000 | ORAL_TABLET | ORAL | 0 refills | Status: DC | PRN
  Filled 2021-01-03 (×2): qty 42, 7d supply, fill #0

## 2020-12-31 MED ORDER — OXYCODONE HCL 5 MG PO TABS
5.0000 mg | ORAL_TABLET | ORAL | 0 refills | Status: DC | PRN
  Filled 2021-01-03: qty 42, 7d supply, fill #0

## 2021-01-03 ENCOUNTER — Other Ambulatory Visit (HOSPITAL_COMMUNITY): Payer: Self-pay

## 2021-01-06 ENCOUNTER — Other Ambulatory Visit (HOSPITAL_COMMUNITY): Payer: Self-pay

## 2021-01-06 MED ORDER — OXYCODONE-ACETAMINOPHEN 10-325 MG PO TABS
1.0000 | ORAL_TABLET | ORAL | 0 refills | Status: DC | PRN
  Filled 2021-01-10: qty 42, 7d supply, fill #0

## 2021-01-06 MED ORDER — OXYCODONE HCL 5 MG PO TABS
5.0000 mg | ORAL_TABLET | ORAL | 0 refills | Status: DC | PRN
  Filled 2021-01-10: qty 42, 7d supply, fill #0

## 2021-01-07 ENCOUNTER — Other Ambulatory Visit (HOSPITAL_COMMUNITY): Payer: Self-pay

## 2021-01-07 ENCOUNTER — Other Ambulatory Visit: Payer: Self-pay | Admitting: Student

## 2021-01-10 ENCOUNTER — Telehealth (HOSPITAL_COMMUNITY): Payer: Self-pay

## 2021-01-10 ENCOUNTER — Other Ambulatory Visit (HOSPITAL_COMMUNITY): Payer: Self-pay

## 2021-01-10 DIAGNOSIS — S72141D Displaced intertrochanteric fracture of right femur, subsequent encounter for closed fracture with routine healing: Secondary | ICD-10-CM | POA: Diagnosis not present

## 2021-01-10 MED ORDER — METHOCARBAMOL 500 MG PO TABS
500.0000 mg | ORAL_TABLET | Freq: Three times a day (TID) | ORAL | 0 refills | Status: DC | PRN
  Filled 2021-01-10: qty 30, 10d supply, fill #0

## 2021-01-10 NOTE — Telephone Encounter (Signed)
Pharmacy Transitions of Care Follow-up Telephone Call  Date of discharge: 12/25/20  Discharge Diagnosis: elective hip surgery  How have you been since you were released from the hospital?  Patient has been doing very well. Has had elective surgeries in the past and has been on Eliquis before.  Medication changes made at discharge:  - START: Eliquis  - STOPPED: ASA-Caffeine-APAP  Medication changes verified by the patient? Yes    Medication Accessibility:  Home Pharmacy: [facility]   Was the patient provided with refills on discharged medications? No   Have all prescriptions been transferred from [facility] to home pharmacy? N/a   Is the patient able to afford medications? Patient has insurance    Medication Review:  APIXABAN (ELIQUIS)  Apixaban 5 mg BID initiated on 12/24/20 - Discussed importance of taking medication around the same time everyday  - Reviewed potential DDIs with patient - Advised patient of medications to avoid (NSAIDs, ASA)  - Educated that Tylenol (acetaminophen) will be the preferred analgesic to prevent risk of bleeding  - Emphasized importance of monitoring for signs and symptoms of bleeding (abnormal bruising, prolonged bleeding, nose bleeds, bleeding from gums, discolored urine, black tarry stools)  - Advised patient to alert all providers of anticoagulation therapy prior to starting a new medication or having a procedure    Follow-up Appointments:  PCP Hospital f/u appt confirmed?  Telephone follow up with Dr.  Hearing on 12/30/20 with [facility] f/u appt confirmed? None currently scheduled  If their condition worsens, is the pt aware to call PCP or go to the Emergency Dept.? Yes  Final Patient Assessment: Patient has follow up scheduled and knows to get refill from MD if needed for longer than this month.

## 2021-01-14 ENCOUNTER — Other Ambulatory Visit (HOSPITAL_COMMUNITY): Payer: Self-pay

## 2021-01-14 MED ORDER — OXYCODONE HCL 5 MG PO TABS
5.0000 mg | ORAL_TABLET | ORAL | 0 refills | Status: DC | PRN
  Filled 2021-01-14: qty 42, 7d supply, fill #0

## 2021-01-14 MED ORDER — OXYCODONE-ACETAMINOPHEN 10-325 MG PO TABS
1.0000 | ORAL_TABLET | ORAL | 0 refills | Status: DC | PRN
  Filled 2021-01-14: qty 42, 7d supply, fill #0

## 2021-01-17 ENCOUNTER — Other Ambulatory Visit (HOSPITAL_COMMUNITY): Payer: Self-pay

## 2021-01-18 ENCOUNTER — Other Ambulatory Visit (HOSPITAL_COMMUNITY): Payer: Self-pay

## 2021-01-18 DIAGNOSIS — N5201 Erectile dysfunction due to arterial insufficiency: Secondary | ICD-10-CM | POA: Diagnosis not present

## 2021-01-18 MED ORDER — TADALAFIL 20 MG PO TABS
20.0000 mg | ORAL_TABLET | Freq: Every day | ORAL | 5 refills | Status: DC | PRN
  Filled 2021-01-18: qty 30, 30d supply, fill #0
  Filled 2021-02-13: qty 30, 30d supply, fill #1
  Filled 2021-04-11 – 2021-04-20 (×2): qty 30, 30d supply, fill #2

## 2021-01-21 ENCOUNTER — Other Ambulatory Visit (HOSPITAL_COMMUNITY): Payer: Self-pay

## 2021-01-21 MED ORDER — OXYCODONE HCL 5 MG PO TABS
5.0000 mg | ORAL_TABLET | ORAL | 0 refills | Status: DC | PRN
  Filled 2021-01-24: qty 42, 7d supply, fill #0

## 2021-01-21 MED ORDER — OXYCODONE-ACETAMINOPHEN 10-325 MG PO TABS
1.0000 | ORAL_TABLET | ORAL | 0 refills | Status: DC | PRN
  Filled 2021-01-24: qty 42, 7d supply, fill #0

## 2021-01-22 ENCOUNTER — Other Ambulatory Visit: Payer: Self-pay | Admitting: Family Medicine

## 2021-01-24 ENCOUNTER — Other Ambulatory Visit (HOSPITAL_COMMUNITY): Payer: Self-pay

## 2021-01-24 ENCOUNTER — Telehealth: Payer: Self-pay | Admitting: Podiatry

## 2021-01-24 NOTE — Telephone Encounter (Signed)
Pt called and left message asking about a prosthetic device for his foot that is missing the great toe. He stated that if he is on the roof or any slanted location he losses his balance. He stated he has a call into a place in [place] that makes them but has not heard back. I was not sure if insurance would cover since pt is not a diabetic.   I called [facility] and they said it should be covered but if not the charge 250.00 per insert for the toe filler. I gave pt there information for him to call. I told pt I would get a prescription for him to take to [facility] and would call when it is ready.

## 2021-01-25 DIAGNOSIS — S72141D Displaced intertrochanteric fracture of right femur, subsequent encounter for closed fracture with routine healing: Secondary | ICD-10-CM | POA: Diagnosis not present

## 2021-01-26 NOTE — Telephone Encounter (Signed)
Notified pt rx was ready and he is wanting to pick it up.

## 2021-01-28 ENCOUNTER — Other Ambulatory Visit (HOSPITAL_COMMUNITY): Payer: Self-pay

## 2021-01-28 MED ORDER — OXYCODONE HCL 5 MG PO TABS
5.0000 mg | ORAL_TABLET | ORAL | 0 refills | Status: DC | PRN
  Filled 2021-01-28: qty 42, 7d supply, fill #0

## 2021-01-28 MED ORDER — OXYCODONE HCL 15 MG PO TABS
15.0000 mg | ORAL_TABLET | ORAL | 0 refills | Status: DC | PRN
  Filled 2021-01-28: qty 60, 10d supply, fill #0

## 2021-01-28 MED ORDER — OXYCODONE-ACETAMINOPHEN 10-325 MG PO TABS
1.0000 | ORAL_TABLET | ORAL | 0 refills | Status: DC | PRN
  Filled 2021-01-28: qty 42, 7d supply, fill #0

## 2021-02-01 ENCOUNTER — Other Ambulatory Visit (HOSPITAL_COMMUNITY): Payer: Self-pay

## 2021-02-02 ENCOUNTER — Other Ambulatory Visit (HOSPITAL_COMMUNITY): Payer: Self-pay

## 2021-02-03 ENCOUNTER — Other Ambulatory Visit (HOSPITAL_COMMUNITY): Payer: Self-pay

## 2021-02-03 MED ORDER — OXYCODONE HCL 15 MG PO TABS
15.0000 mg | ORAL_TABLET | ORAL | 0 refills | Status: DC | PRN
  Filled 2021-02-08: qty 60, 10d supply, fill #0

## 2021-02-04 ENCOUNTER — Other Ambulatory Visit (HOSPITAL_COMMUNITY): Payer: Self-pay

## 2021-02-04 MED ORDER — METHOCARBAMOL 500 MG PO TABS
500.0000 mg | ORAL_TABLET | Freq: Three times a day (TID) | ORAL | 0 refills | Status: DC
  Filled 2021-02-04: qty 30, 10d supply, fill #0

## 2021-02-07 ENCOUNTER — Other Ambulatory Visit (HOSPITAL_COMMUNITY): Payer: Self-pay

## 2021-02-08 ENCOUNTER — Other Ambulatory Visit (HOSPITAL_COMMUNITY): Payer: Self-pay

## 2021-02-11 ENCOUNTER — Other Ambulatory Visit (HOSPITAL_COMMUNITY): Payer: Self-pay

## 2021-02-11 MED ORDER — DIAZEPAM 5 MG PO TABS
5.0000 mg | ORAL_TABLET | Freq: Every evening | ORAL | 1 refills | Status: DC | PRN
  Filled 2021-02-11: qty 30, 30d supply, fill #0

## 2021-02-14 ENCOUNTER — Other Ambulatory Visit (HOSPITAL_COMMUNITY): Payer: Self-pay

## 2021-02-15 ENCOUNTER — Other Ambulatory Visit (HOSPITAL_COMMUNITY): Payer: Self-pay

## 2021-02-15 DIAGNOSIS — S72141D Displaced intertrochanteric fracture of right femur, subsequent encounter for closed fracture with routine healing: Secondary | ICD-10-CM | POA: Diagnosis not present

## 2021-02-15 MED ORDER — OXYCODONE HCL 15 MG PO TABS
15.0000 mg | ORAL_TABLET | ORAL | 0 refills | Status: DC | PRN
  Filled 2021-02-16: qty 60, 10d supply, fill #0

## 2021-02-16 ENCOUNTER — Telehealth: Payer: Self-pay | Admitting: Family Medicine

## 2021-02-16 ENCOUNTER — Ambulatory Visit: Payer: Medicare Other | Admitting: Family Medicine

## 2021-02-16 ENCOUNTER — Other Ambulatory Visit (HOSPITAL_COMMUNITY): Payer: Self-pay

## 2021-02-16 NOTE — Progress Notes (Deleted)
    Jaretssi Kraker L Rudean Icenhour, MD [facility]    SUBJECTIVE:   CHIEF COMPLAINT / HPI:   R index finger Has pain at the base on the palm with mild soft tissue swelling present for about 2 weeks.  No specific trauma.  No other painful areas.  Sometimes finger seems to catch   Renal  No edema.  Feels well.  Brings all her medications in   Hypertension Not checking.  No chest pain or shortness of breath or lightheadness   Thyroid  No significant weight changes or fatigue   PERTINENT  PMH / PSH: HIV well controlled Exercises three times per week by walking    PERTINENT  PMH / PSH: unsure if she wants to retire  OBJECTIVE:   BP 134/73   Pulse 62   Wt 201 lb (91.2 kg)   SpO2 100%   BMI 32.44 kg/m   R palm - focal tenderness with mild soft tissue swelling at base of index finger.  Finger catches slightly with extension  no redness or swelling of the finger Heart - Regular rate and rhythm.  No murmurs, gallops or rubs.    Lungs:  Normal respiratory effort, chest expands symmetrically. Lungs are clear to auscultation, no crackles or wheezes. Extremities:  No cyanosis, edema, or deformity noted with good range of motion of all major joints.     ASSESSMENT/PLAN:   Acquired trigger finger of right index finger Consistent with inflamation soft tissue swelling of flexor tendons.  No signs of fracture or infection or systemic inflamation. Will treat with topical nsaid (given her renal insuff) and ice.  If not improving consider injection   Essential hypertension BP Readings from Last 3 Encounters:  12/13/21 134/73  08/18/21 116/74  07/07/21 125/80   At goal continue current medications    Hypothyroidism Lab Results  Component Value Date   TSH 2.680 06/08/2021   At goal continue current medications     Patient Instructions  Good to see you today - Thank you for coming in  Things we discussed today:  Hand - use the diclofenac gel four times a day  -  Use ice in between if aching - Use Tyelnol by mouth for pain  - If not better come back for an injection  For the nasal drip Start with Clariten one tab every day If not better in one week let me know and will start nasal spray steroids   Check with ID about the Zoster vaccines to help prevent Shingles.  The shot may cause a sore arm and mild flu like symptoms for a few days.  You will need a second shot 2 months after your first.    Please always bring your medication bottles  Come back to see me in 6 months    Orvel Cutsforth L Raeya Merritts, MD [facility]  

## 2021-02-16 NOTE — Patient Instructions (Incomplete)
Good to see you today - Thank you for coming in  Things we discussed today:  You need a colonoscopy to prevent colon cancer.  I have placed a referral to the gastroenterologist's office.  They should call you within two weeks.  If they do not please let me know    Please always bring your medication bottles  Come back to see me in ***

## 2021-02-16 NOTE — Telephone Encounter (Signed)
Patient was scheduled for an office visit today Called after the time of the visit spoke with [facility] Requested a call back.  He had episode of vomiting several days last week better now.  Also said found out yesterday one of the rods in his leg is broken completely  I suggested that if his stomach does not get back to normal he should be seen.

## 2021-02-21 ENCOUNTER — Other Ambulatory Visit (HOSPITAL_COMMUNITY): Payer: Self-pay

## 2021-02-21 DIAGNOSIS — S72141D Displaced intertrochanteric fracture of right femur, subsequent encounter for closed fracture with routine healing: Secondary | ICD-10-CM | POA: Diagnosis not present

## 2021-02-21 MED ORDER — OXYCODONE HCL 30 MG PO TABS
30.0000 mg | ORAL_TABLET | Freq: Four times a day (QID) | ORAL | 0 refills | Status: DC | PRN
  Filled 2021-02-21: qty 40, 10d supply, fill #0

## 2021-02-23 ENCOUNTER — Other Ambulatory Visit: Payer: Self-pay | Admitting: Family Medicine

## 2021-02-28 DIAGNOSIS — E291 Testicular hypofunction: Secondary | ICD-10-CM | POA: Diagnosis not present

## 2021-02-28 DIAGNOSIS — N5201 Erectile dysfunction due to arterial insufficiency: Secondary | ICD-10-CM | POA: Diagnosis not present

## 2021-02-28 DIAGNOSIS — Z8546 Personal history of malignant neoplasm of prostate: Secondary | ICD-10-CM | POA: Diagnosis not present

## 2021-03-01 ENCOUNTER — Other Ambulatory Visit (HOSPITAL_COMMUNITY): Payer: Self-pay

## 2021-03-01 MED ORDER — OXYCODONE HCL 30 MG PO TABS
30.0000 mg | ORAL_TABLET | Freq: Four times a day (QID) | ORAL | 0 refills | Status: DC | PRN
  Filled 2021-03-02: qty 50, 9d supply, fill #0

## 2021-03-02 ENCOUNTER — Other Ambulatory Visit (HOSPITAL_COMMUNITY): Payer: Self-pay

## 2021-03-07 DIAGNOSIS — M25562 Pain in left knee: Secondary | ICD-10-CM | POA: Diagnosis not present

## 2021-03-07 DIAGNOSIS — M25561 Pain in right knee: Secondary | ICD-10-CM | POA: Diagnosis not present

## 2021-03-08 ENCOUNTER — Other Ambulatory Visit (HOSPITAL_COMMUNITY): Payer: Self-pay

## 2021-03-08 DIAGNOSIS — S72141D Displaced intertrochanteric fracture of right femur, subsequent encounter for closed fracture with routine healing: Secondary | ICD-10-CM | POA: Diagnosis not present

## 2021-03-09 ENCOUNTER — Other Ambulatory Visit (HOSPITAL_COMMUNITY): Payer: Self-pay

## 2021-03-09 ENCOUNTER — Ambulatory Visit: Payer: Medicare Other | Admitting: Podiatry

## 2021-03-09 MED ORDER — OXYCODONE HCL 30 MG PO TABS
ORAL_TABLET | ORAL | 0 refills | Status: DC
  Filled 2021-03-09: qty 60, 10d supply, fill #0

## 2021-03-09 MED ORDER — OXYCODONE HCL 30 MG PO TABS
30.0000 mg | ORAL_TABLET | ORAL | 0 refills | Status: DC | PRN
  Filled 2021-03-09: qty 60, 10d supply, fill #0

## 2021-03-10 ENCOUNTER — Other Ambulatory Visit (HOSPITAL_COMMUNITY): Payer: Self-pay

## 2021-03-15 ENCOUNTER — Other Ambulatory Visit (HOSPITAL_COMMUNITY): Payer: Self-pay

## 2021-03-15 MED ORDER — OXYCODONE HCL 30 MG PO TABS
ORAL_TABLET | ORAL | 0 refills | Status: DC
  Filled 2021-03-19: qty 60, 10d supply, fill #0

## 2021-03-18 ENCOUNTER — Other Ambulatory Visit (HOSPITAL_COMMUNITY): Payer: Self-pay

## 2021-03-19 ENCOUNTER — Other Ambulatory Visit (HOSPITAL_COMMUNITY): Payer: Self-pay

## 2021-03-21 ENCOUNTER — Other Ambulatory Visit (HOSPITAL_COMMUNITY): Payer: Self-pay

## 2021-03-21 MED ORDER — OXYCODONE HCL 30 MG PO TABS
30.0000 mg | ORAL_TABLET | ORAL | 0 refills | Status: DC | PRN
  Filled 2021-03-21: qty 32, 6d supply, fill #0

## 2021-03-24 ENCOUNTER — Other Ambulatory Visit (HOSPITAL_COMMUNITY): Payer: Self-pay

## 2021-03-24 ENCOUNTER — Telehealth: Payer: Self-pay

## 2021-03-24 MED ORDER — OXYCODONE HCL 30 MG PO TABS: 30.0000 mg | ORAL_TABLET | ORAL | 0 refills | Status: DC | PRN

## 2021-03-24 NOTE — Telephone Encounter (Signed)
Richardson Landry, pharmacist at [facility] calls nurse line requesting to speak with PCP.   Richardson Landry reports concerns with patients opioid use. Richardson Landry reports he was given #60 30mg  on 11/26 and has another prescription due tomorrow on 12/2 for #42. Richardson Landry reports he spoke to the patient and he stated they were not working so he has been taking double at a time. Richardson Landry does not feel comfortable dispensing 12/2 quantity without speaking to PCP first.   Please call him directly at [phone].

## 2021-03-25 ENCOUNTER — Ambulatory Visit: Payer: Medicare Other | Admitting: Podiatry

## 2021-03-25 NOTE — Telephone Encounter (Signed)
[place] he should be concerned Suggested he contact the prescriber and he related he has I will continue to discuss with Thomas [name] our concerns

## 2021-03-28 ENCOUNTER — Other Ambulatory Visit: Payer: Self-pay

## 2021-03-28 MED ORDER — LISINOPRIL 10 MG PO TABS: 10.0000 mg | ORAL_TABLET | Freq: Every day | ORAL | 0 refills | Status: DC

## 2021-03-29 DIAGNOSIS — S72141D Displaced intertrochanteric fracture of right femur, subsequent encounter for closed fracture with routine healing: Secondary | ICD-10-CM | POA: Diagnosis not present

## 2021-04-04 DIAGNOSIS — N5201 Erectile dysfunction due to arterial insufficiency: Secondary | ICD-10-CM | POA: Diagnosis not present

## 2021-04-05 DIAGNOSIS — R5383 Other fatigue: Secondary | ICD-10-CM | POA: Diagnosis not present

## 2021-04-05 DIAGNOSIS — C61 Malignant neoplasm of prostate: Secondary | ICD-10-CM | POA: Diagnosis not present

## 2021-04-05 DIAGNOSIS — Z111 Encounter for screening for respiratory tuberculosis: Secondary | ICD-10-CM | POA: Diagnosis not present

## 2021-04-05 DIAGNOSIS — M0579 Rheumatoid arthritis with rheumatoid factor of multiple sites without organ or systems involvement: Secondary | ICD-10-CM | POA: Diagnosis not present

## 2021-04-05 DIAGNOSIS — M79604 Pain in right leg: Secondary | ICD-10-CM | POA: Diagnosis not present

## 2021-04-05 DIAGNOSIS — M150 Primary generalized (osteo)arthritis: Secondary | ICD-10-CM | POA: Diagnosis not present

## 2021-04-07 ENCOUNTER — Encounter: Payer: Self-pay | Admitting: Surgical

## 2021-04-12 ENCOUNTER — Other Ambulatory Visit (HOSPITAL_COMMUNITY): Payer: Self-pay

## 2021-04-19 DIAGNOSIS — S72141D Displaced intertrochanteric fracture of right femur, subsequent encounter for closed fracture with routine healing: Secondary | ICD-10-CM | POA: Diagnosis not present

## 2021-04-20 ENCOUNTER — Other Ambulatory Visit (HOSPITAL_COMMUNITY): Payer: Self-pay

## 2021-05-03 DIAGNOSIS — S72141D Displaced intertrochanteric fracture of right femur, subsequent encounter for closed fracture with routine healing: Secondary | ICD-10-CM | POA: Diagnosis not present

## 2021-05-10 ENCOUNTER — Other Ambulatory Visit: Payer: Self-pay

## 2021-05-10 ENCOUNTER — Encounter: Payer: Self-pay | Admitting: Podiatry

## 2021-05-10 ENCOUNTER — Ambulatory Visit: Payer: Medicare Other | Admitting: Podiatry

## 2021-05-10 ENCOUNTER — Ambulatory Visit (INDEPENDENT_AMBULATORY_CARE_PROVIDER_SITE_OTHER): Payer: Medicare Other

## 2021-05-10 DIAGNOSIS — M86171 Other acute osteomyelitis, right ankle and foot: Secondary | ICD-10-CM | POA: Diagnosis not present

## 2021-05-10 DIAGNOSIS — L97514 Non-pressure chronic ulcer of other part of right foot with necrosis of bone: Secondary | ICD-10-CM | POA: Diagnosis not present

## 2021-05-10 MED ORDER — DOXYCYCLINE HYCLATE 100 MG PO TABS: 100.0000 mg | ORAL_TABLET | Freq: Two times a day (BID) | ORAL | 0 refills | Status: DC

## 2021-05-10 NOTE — Progress Notes (Addendum)
°  Subjective:  Patient ID: Thomas [name], male    DOB: 06-May-1950,   MRN: [id number]  Chief Complaint  Patient presents with   Foot Problem    Pt is here due to right swelling of the foot and 2nd toe is red. No injury pain 5/10     71 y.o. male presents for concern of right  third toe infection. Relates swelling redness and the skin is splitting. Relates he noticed this about 6 days ago. No injury and relates the pain is about 5/10. Has not been dressing the area. Relates pain and swelling going up the leg.  Has a history of right great toe amputation and distal third digit.   Denies any other pedal complaints. Denies n/v/f/c.   Past Medical History:  Diagnosis Date   Atrial flutter ([place]) 01/2020   Cancer ([place])    prostate   Heart failure with reduced ejection fraction (HCC)    Hypertension    Hypoglycemia    occ   NICM (nonischemic cardiomyopathy) ([place]) 02/12/2020   Prostate cancer ([place]) 2019   Rheumatoid arthritis ([place])     Objective:  Physical Exam: Vascular: DP/PT pulses 2/4 bilateral. CFT <3 seconds. Normal hair growth on digits. No edema.  Skin. No lacerations or abrasions bilateral feet. Third digit  wound with granular base measuring 0.5 cm x 0.7 cm that does probe to bone. Edema and erythema noted around the toe and edema noted to the right lower extremity.  Musculoskeletal: MMT 5/5 bilateral lower extremities in DF, PF, Inversion and Eversion. Deceased ROM in DF of ankle joint.  Neurological: Sensation intact to light touch.   Assessment:   1. Foot ulcer, right, with necrosis of bone ([place])      Plan:  Patient was evaluated and treated and all questions answered. Ulcer right foot with fat layer exposed.  -Debridement as below. -Dressed with betadine, DSD. -Off-loading with surgical shoe. Has one at home.  -Doxycycline sent to pharmacy  -MRI ordered  -Wound cultures sent to the lab.  -Reviewed previous vascular studies.  -Discussed glucose control and proper  protein-rich diet.  -Discussed if any worsening redness, pain, fever or chills to call or may need to report to the emergency room. Patient expressed understanding.   Procedure: Excisional Debridement of Wound Rationale: Removal of non-viable soft tissue from the wound to promote healing.  Anesthesia: none Pre-Debridement Wound Measurements: Overlying hyperkeratosis Post-Debridement Wound Measurements: 0.5 cm x 0.7 cm x 0.5 cm  Type of Debridement: Sharp Excisional Tissue Removed: Non-viable soft tissue Depth of Debridement: subcutaneous tissue. Technique: Sharp excisional debridement to bleeding, viable wound base.  Dressing: Dry, sterile, compression dressing. Disposition: Patient tolerated procedure well. Patient to return in 1 week for follow-up.  No follow-ups on file.   Lorenda Peck, DPM

## 2021-05-10 NOTE — Addendum Note (Signed)
Addended by: Lind Guest on: 05/10/2021 11:48 AM   Modules accepted: Orders

## 2021-05-11 ENCOUNTER — Ambulatory Visit: Payer: Medicare Other | Admitting: Podiatry

## 2021-05-11 ENCOUNTER — Telehealth: Payer: Self-pay | Admitting: *Deleted

## 2021-05-11 ENCOUNTER — Ambulatory Visit
Admission: RE | Admit: 2021-05-11 | Discharge: 2021-05-11 | Disposition: A | Discharge status: Home / Self Care | Payer: Medicare Other | Source: Ambulatory Visit | Attending: Podiatry | Admitting: Podiatry

## 2021-05-11 DIAGNOSIS — L97514 Non-pressure chronic ulcer of other part of right foot with necrosis of bone: Secondary | ICD-10-CM

## 2021-05-11 DIAGNOSIS — M7989 Other specified soft tissue disorders: Secondary | ICD-10-CM | POA: Diagnosis not present

## 2021-05-11 DIAGNOSIS — R601 Generalized edema: Secondary | ICD-10-CM | POA: Diagnosis not present

## 2021-05-11 DIAGNOSIS — R600 Localized edema: Secondary | ICD-10-CM | POA: Diagnosis not present

## 2021-05-11 DIAGNOSIS — M19071 Primary osteoarthritis, right ankle and foot: Secondary | ICD-10-CM | POA: Diagnosis not present

## 2021-05-11 IMAGING — MR MR FOOT*R* W/O CM
4 of 5 series · 30 of 40 positions shown · non-contrast
Comparison: Outside foot radiographs [DATE] and [DATE]. MRI
[DATE].

CLINICAL DATA: Foot swelling for 1 week. Nondiabetic. Osteomyelitis
suspected. History of previous surgery for bone infection in [7B].

EXAM:
MRI OF THE RIGHT FOREFOOT WITHOUT CONTRAST
TECHNIQUE: Multiplanar, multisequence MR imaging of the right forefoot was
performed. No intravenous contrast was administered.

[Series 6: T1 · coronal · 4.0mm · 0.38mm/px · 9 of 38 slices shown (1 of 2)]
[im 1/38]
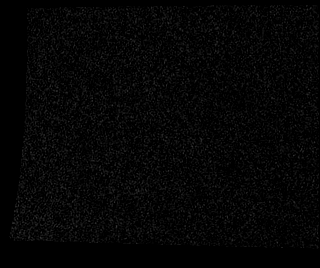
[im 5/38]
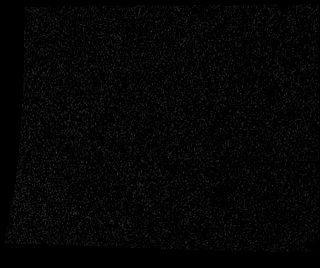
[im 10/38]
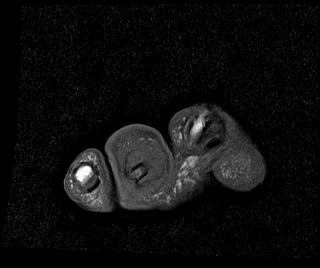
[im 14/38]
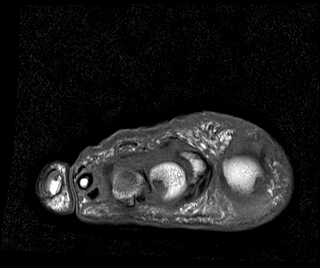
[im 19/38]
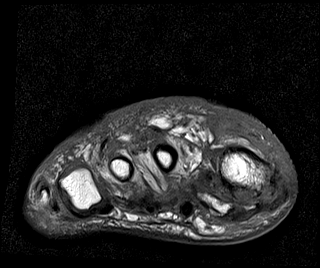
[im 24/38]
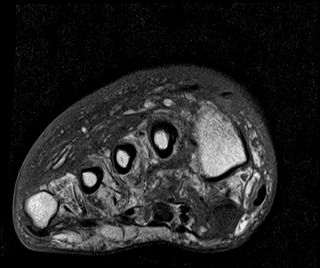
[im 28/38]
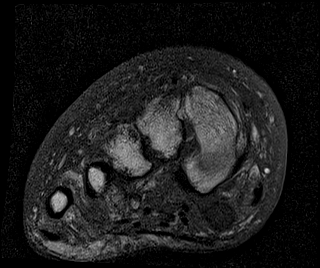
[im 33/38]
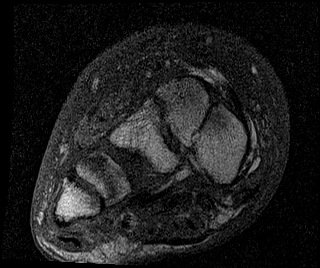
[im 38/38]
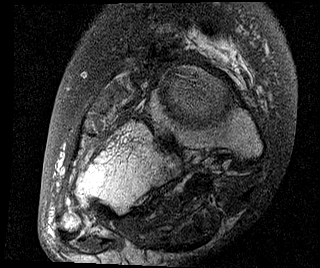

[Series 7: T2 fat-sat · coronal · 4.0mm · 0.23mm/px · 9 of 38 slices shown (1 of 2)]
[im 1/38]
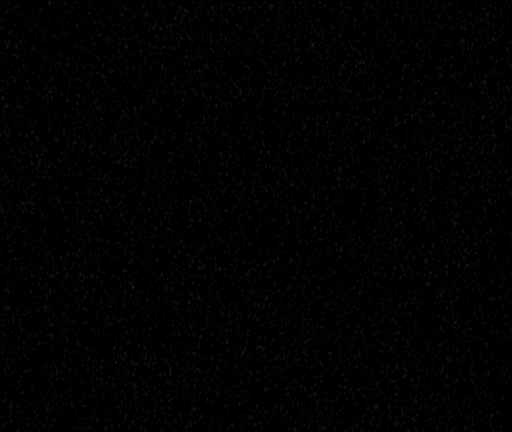
[im 5/38]
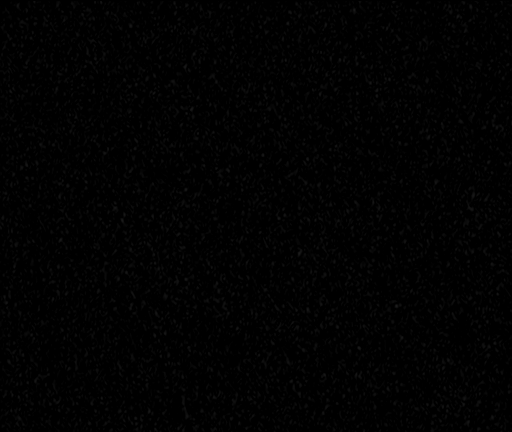
[im 10/38]
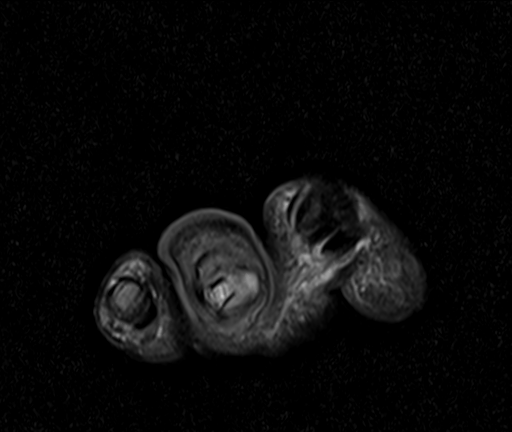
[im 14/38]
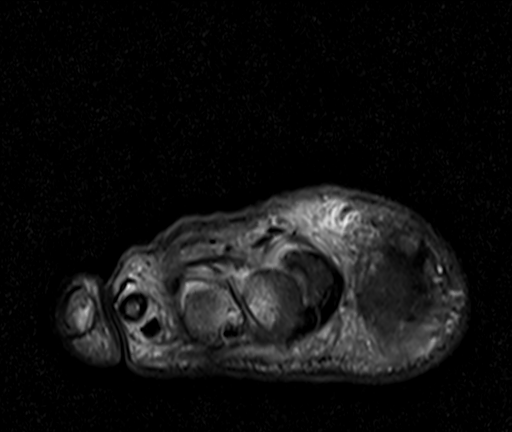
[im 19/38]
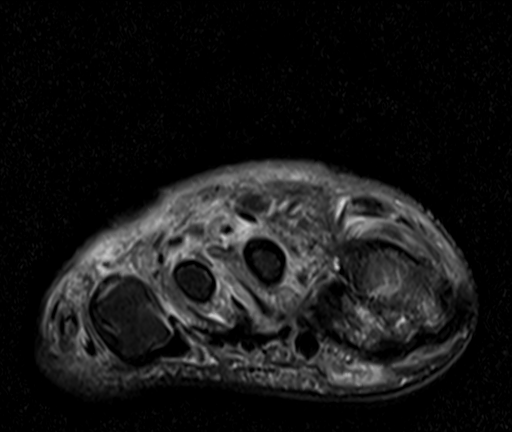
[im 24/38]
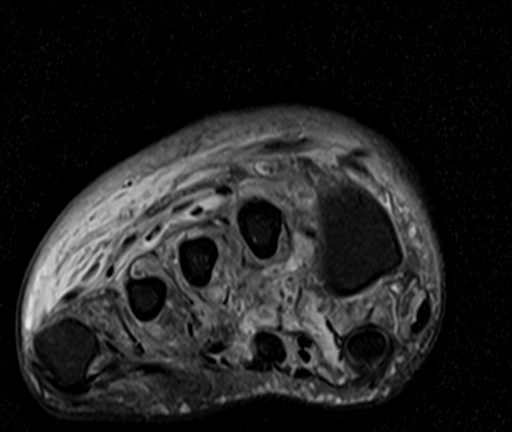
[im 28/38]
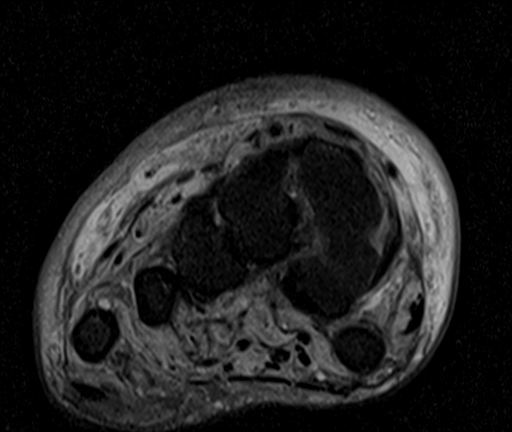
[im 33/38]
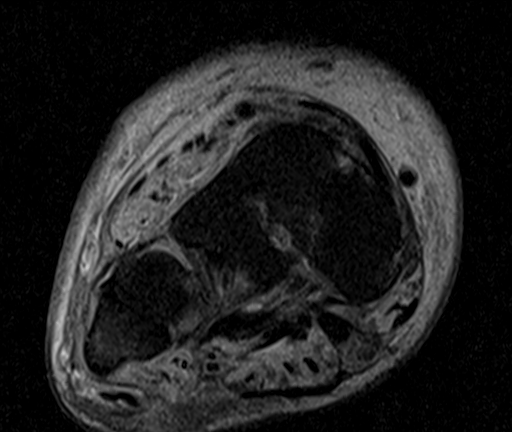
[im 38/38]
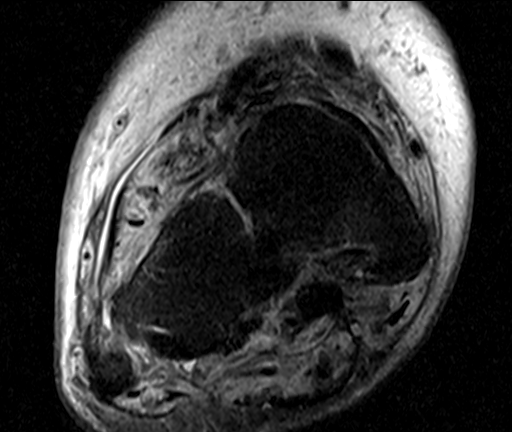

[Series 8: T2 fat-sat · axial · 3.0mm · 0.37mm/px · z∈[-134,-60]mm · 7 of 27 slices shown (2 of 2)]
[im 1/27]
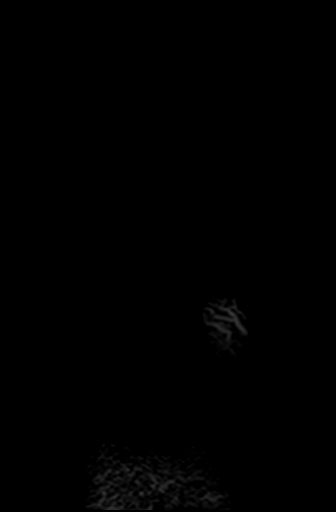
[im 5/27]
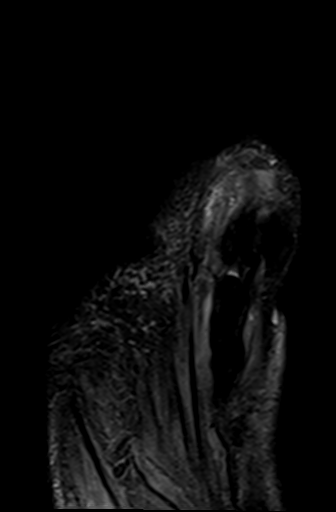
[im 9/27]
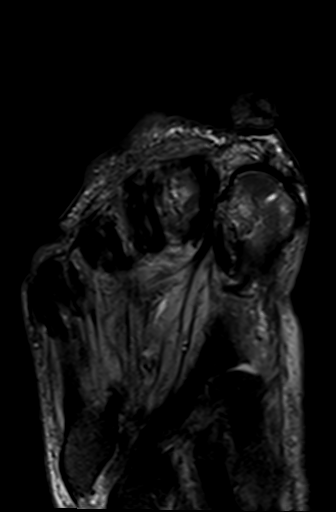
[im 14/27]
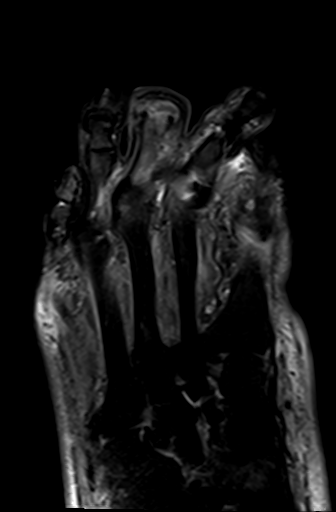
[im 18/27]
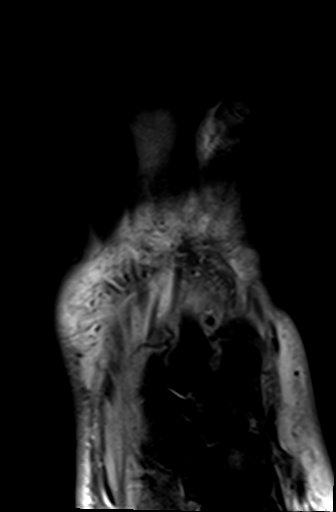
[im 22/27]
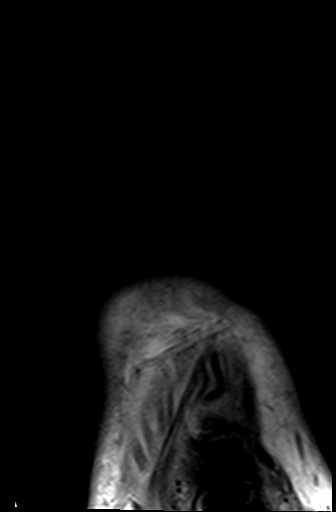
[im 27/27]
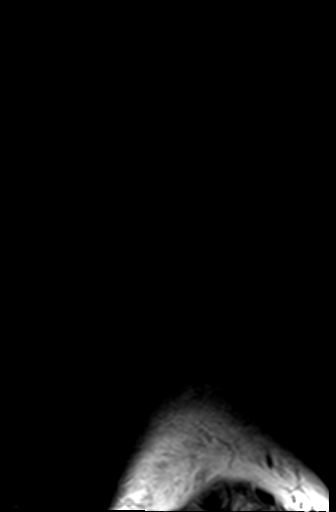

[Series 9: T1 · axial · 3.0mm · 0.37mm/px · z∈[-134,-74]mm · 5 of 27 slices shown (2 of 2)]
[im 1/27]
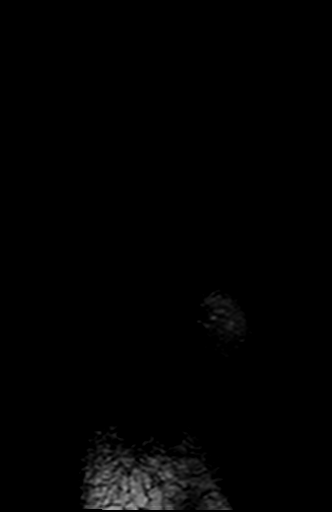
[im 5/27]
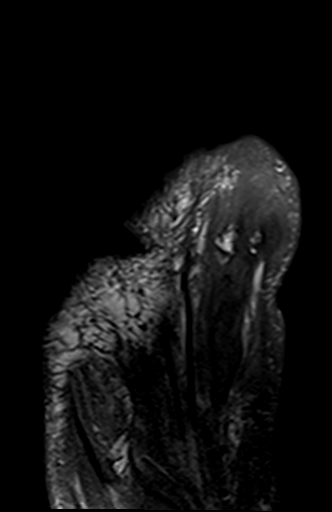
[im 9/27]
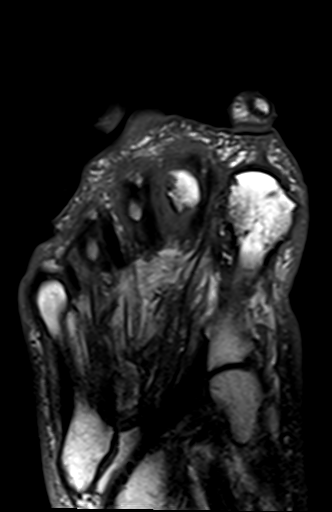
[im 14/27]
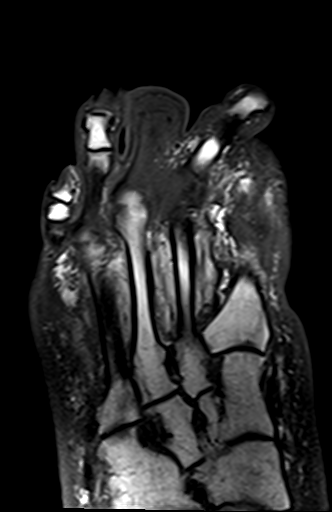
[im 22/27]
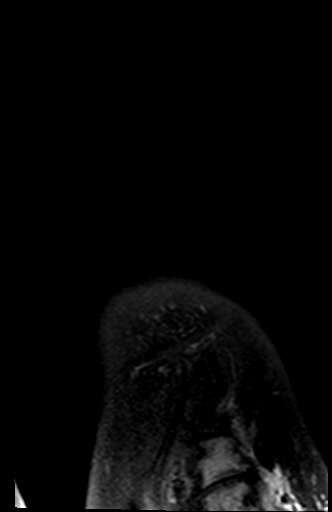

[30 of 40 positions shown; findings below may reference images not displayed]

FINDINGS: Bones/Joint/Cartilage

In correlation with prior radiographs, the patient is status post
previous amputation of the great toe as well as the middle and
distal phalanges of the 3rd toe. There is chronic erosion of the
head of the 5th proximal phalanx which may be postsurgical. There is
new diffuse abnormal marrow signal throughout the 3rd proximal
phalanx which is highly suspicious for osteomyelitis. There is
diffusely decreased T1 and increased T2 signal. There appears to be
a small amount of fluid adjacent to the head of the 3rd proximal
phalanx.

Grossly stable chronic degenerative changes of the 1st metatarsal
head and its sesamoids. Mildly progressive degenerative changes at
the 2nd metatarsophalangeal joint with associated mildly progressive
medial subluxation. Mild midfoot degenerative changes. No other
suspicious osseous findings.

Ligaments

The Lisfranc ligament appears intact.

Muscles and Tendons

Thickening of the detached flexor tendons of the great toe and 3rd
toe without significant tenosynovitis. Diffuse low-grade edema
throughout the forefoot musculature without focal fluid collection.

Soft tissues

As above, apparent soft tissue ulceration and a small amount of
fluid adjacent to the head of the 3rd proximal phalanx. Generalized
subcutaneous edema, greatest dorsally. No other focal fluid
collections are seen.
IMPRESSION: 1. Findings are consistent with osteomyelitis of the 3rd proximal
phalanx.
2. No other acute osseous findings. Postsurgical and degenerative
changes as described with mildly progressive degenerative changes at
the 2nd MTP joint.
3. Probable skin ulceration and small fluid collection adjacent to
the 3rd proximal phalangeal head. Generalized subcutaneous edema
without other focal fluid collection.

## 2021-05-11 NOTE — Telephone Encounter (Signed)
Patient is calling because he is having simular issues w/ left foot and wanted to know if he can have MRI done on both feet. He is scheduled for MRI today. Returned the call to patient ,no answer but left vmessage explaining that only the right foot was evaluated at appointment and he would have to have left one evaluated by physician and she will decide if an MRI is needed for that one as well.

## 2021-05-12 NOTE — Telephone Encounter (Signed)
Patient called again for an update. Please advise.

## 2021-05-16 ENCOUNTER — Ambulatory Visit: Payer: Medicare Other | Admitting: Podiatry

## 2021-05-16 ENCOUNTER — Encounter: Payer: Self-pay | Admitting: Podiatry

## 2021-05-16 ENCOUNTER — Other Ambulatory Visit: Payer: Self-pay

## 2021-05-16 DIAGNOSIS — L97514 Non-pressure chronic ulcer of other part of right foot with necrosis of bone: Secondary | ICD-10-CM

## 2021-05-16 DIAGNOSIS — M86171 Other acute osteomyelitis, right ankle and foot: Secondary | ICD-10-CM | POA: Diagnosis not present

## 2021-05-16 DIAGNOSIS — D509 Iron deficiency anemia, unspecified: Secondary | ICD-10-CM | POA: Diagnosis not present

## 2021-05-16 MED ORDER — DOXYCYCLINE HYCLATE 100 MG PO TABS: 100.0000 mg | ORAL_TABLET | Freq: Two times a day (BID) | ORAL | 0 refills | Status: DC

## 2021-05-16 NOTE — Progress Notes (Signed)
°  Subjective:  Patient ID: Thomas [name], male    DOB: 06-25-1950,   MRN: [id number]  Chief Complaint  Patient presents with   Foot Ulcer    Pt is here to f/u ulcer on right foot. Still taking Doxy. Also here for MRI results    71 y.o. male presents for follow-up of right third toe infection and MRI review. Patient relates after starting the antibiotics he has had improvement in the toe and in his pain. Relates he would like to avoid amputation of the toe. Relates it is doing a lot better.    Denies any other pedal complaints. Denies n/v/f/c.   Past Medical History:  Diagnosis Date   Atrial flutter ([place]) 01/2020   Cancer ([place])    prostate   Heart failure with reduced ejection fraction (HCC)    Hypertension    Hypoglycemia    occ   NICM (nonischemic cardiomyopathy) ([place]) 02/12/2020   Prostate cancer ([place]) 2019   Rheumatoid arthritis ([place])     Objective:  Physical Exam: Vascular: DP/PT pulses 2/4 bilateral. CFT <3 seconds. Normal hair growth on digits. No edema.  Skin. No lacerations or abrasions bilateral feet. Third digit  wound with granular base measuring 0.5 cm x 0.5 cm x 0.2 cm with no probe to bone today. No erythema or edema noted to the remaining third digit. Has had improvement in toe.  Musculoskeletal: MMT 5/5 bilateral lower extremities in DF, PF, Inversion and Eversion. Deceased ROM in DF of ankle joint.  Neurological: Sensation intact to light touch.   Assessment:   1. Foot ulcer, right, with necrosis of bone ([place])   2. Acute osteomyelitis of toe, right ([place])      Plan:  Patient was evaluated and treated and all questions answered. Ulcer right foot with fat layer exposed.  -Debridement as below. -Dressed with betadine, DSD. -Off-loading with surgical shoe.  -Doxycycline reordered.  -MRI reviewed: Osteomyelitis noted in distal aspect of third digit.  -Discussed results and options with patient. Discussed amputation of the digit as option vs antibiotics  treatment. As the wound has improved. Patient would like to continue with antibiotics and would like to avoid amputation. Will refer to ID for recommendations on antibiotics. Currently on one week of doxycycline.  -ID consult placed.   -Wound cultures pending.  -Reviewed previous vascular studies.  -Discussed glucose control and proper protein-rich diet.  -Discussed if any worsening redness, pain, fever or chills to call or may need to report to the emergency room. Patient expressed understanding.   Procedure: Excisional Debridement of Wound Rationale: Removal of non-viable soft tissue from the wound to promote healing.  Anesthesia: none Pre-Debridement Wound Measurements: Overlying hyperkeratosis Post-Debridement Wound Measurements: 0.5 cm x 0.5 cm x 0.2 cm  Type of Debridement: Sharp Excisional Tissue Removed: Non-viable soft tissue Depth of Debridement: subcutaneous tissue. Technique: Sharp excisional debridement to bleeding, viable wound base.  Dressing: Dry, sterile, compression dressing. Disposition: Patient tolerated procedure well. Patient to return in 1 week for follow-up.  No follow-ups on file.   Lorenda Peck, DPM

## 2021-05-17 ENCOUNTER — Ambulatory Visit: Payer: Medicare Other | Admitting: Podiatry

## 2021-05-17 DIAGNOSIS — S72141D Displaced intertrochanteric fracture of right femur, subsequent encounter for closed fracture with routine healing: Secondary | ICD-10-CM | POA: Diagnosis not present

## 2021-05-18 ENCOUNTER — Ambulatory Visit: Payer: Medicare Other | Admitting: Internal Medicine

## 2021-05-18 ENCOUNTER — Telehealth: Payer: Self-pay | Admitting: *Deleted

## 2021-05-18 ENCOUNTER — Telehealth: Payer: Self-pay

## 2021-05-18 ENCOUNTER — Other Ambulatory Visit: Payer: Self-pay

## 2021-05-18 ENCOUNTER — Encounter: Payer: Self-pay | Admitting: Internal Medicine

## 2021-05-18 VITALS — BP 132/82 | HR 71 | Temp 97.5°F | Wt 213.0 lb

## 2021-05-18 DIAGNOSIS — M869 Osteomyelitis, unspecified: Secondary | ICD-10-CM | POA: Diagnosis not present

## 2021-05-18 DIAGNOSIS — E1169 Type 2 diabetes mellitus with other specified complication: Secondary | ICD-10-CM | POA: Diagnosis not present

## 2021-05-18 MED ORDER — CEFADROXIL 500 MG PO CAPS: 500.0000 mg | ORAL_CAPSULE | Freq: Two times a day (BID) | ORAL | 0 refills | Status: DC

## 2021-05-18 NOTE — Telephone Encounter (Signed)
Patient is calling because ID has prescribed another antibiotic to take but he wants to keep taking the doxycycline, will not discontinue until he hears back from Dr Blenda Mounts. Please advise.

## 2021-05-18 NOTE — Telephone Encounter (Signed)
Returned the call to patient explaining instructions per Dr Blenda Mounts, verbalized understanding. I also gave him their (ID)number if he needs to contact.

## 2021-05-18 NOTE — Progress Notes (Signed)
Patient Active Problem List   Diagnosis Date Noted   Fracture of intertrochanteric section of femur, closed, right, with nonunion, subsequent encounter 12/24/2020   Inguinal hernia 10/18/2020   Chronic combined systolic and diastolic CHF, NYHA class 2 ([place])    Osteoporosis 05/05/2020   Hip fracture ([place]) 05/04/2020   Acute osteomyelitis of toe, right (HCC)    Foot infection    NICM (nonischemic cardiomyopathy) ([place]), no significant CAD on cardiac cath   02/12/2020   Right foot pain    Rheumatoid arthritis ([place])    Atrial flutter ([place]) 02/02/2020   Lower extremity edema 12/30/2019   Weight loss, non-intentional 11/24/2019   Chronic right hip pain 06/19/2019   Degenerative tear of acetabular labrum of right hip 05/02/2019   Primary osteoarthritis of right hip 05/02/2019   Skin ulcer ([place]) 01/29/2019   Malignant neoplasm of prostate ([place]) 01/16/2018   Insomnia 10/02/2017   S/P bilateral TKA 09/13/2015   Other bilateral secondary osteoarthritis of knee 07/14/2014   Heme positive stool 05/21/2013   Osteoarthritis, multiple sites 01/13/2011   Hypertension 12/06/2010   CLAUSTROPHOBIA 03/01/2010    Patient's Medications  New Prescriptions   No medications on file  Previous Medications   AMIODARONE (PACERONE) 200 MG TABLET    Take 1 tablet (200 mg total) by mouth daily.   APIXABAN (ELIQUIS) 5 MG TABS TABLET    Take 1 tablet (5 mg total) by mouth 2 (two) times daily.   DIAZEPAM (VALIUM) 5 MG TABLET    5 mg at bedtime as needed for bladder spasms. Use rectally   DIAZEPAM (VALIUM) 5 MG TABLET    Take 1 tablet (5 mg total) by mouth at bedtime as needed.   DOXYCYCLINE (VIBRA-TABS) 100 MG TABLET    Take 1 tablet (100 mg total) by mouth 2 (two) times daily for 14 days.   DULOXETINE (CYMBALTA) 60 MG CAPSULE    TAKE 1 CAPSULE BY MOUTH EVERY DAY - PLS MAKE AN APPOINTMENT FOR AN OFFICE VISIT   LISINOPRIL (ZESTRIL) 10 MG TABLET    Take 1 tablet (10 mg total) by mouth daily. Please  make yearly appt with Dr. Johney Frame for February 2023 for future refills. Thank you 1st attempt   METHOCARBAMOL (ROBAXIN) 500 MG TABLET    Take 1 tablet (500 mg total) by mouth 3 (three) times daily as needed for muscle spasms   METHOCARBAMOL (ROBAXIN) 500 MG TABLET    Take 1 tablet (500 mg total) by mouth 3 (three) times daily for muscle spasms   METOPROLOL SUCCINATE (TOPROL XL) 25 MG 24 HR TABLET    Take 0.5 tablets (12.5 mg total) by mouth daily.   NARCAN 4 MG/0.1ML LIQD NASAL SPRAY KIT    Place 1 spray into the nose as needed (as directed for emergency).   OXYCODONE (OXY IR/ROXICODONE) 5 MG IMMEDIATE RELEASE TABLET    Take 1 tablet (5 mg total) by mouth every 4 (four) hours as needed for pain.   OXYCODONE (OXY IR/ROXICODONE) 5 MG IMMEDIATE RELEASE TABLET    Take 1 tablet (5 mg total) by mouth every 4 (four) hours as needed for pain.   OXYCODONE (OXY IR/ROXICODONE) 5 MG IMMEDIATE RELEASE TABLET    Take 1 tablet by mouth every four hours as needed for pain   OXYCODONE (ROXICODONE) 15 MG IMMEDIATE RELEASE TABLET    Take 1 tablet (15 mg total) by mouth every 4 (four) hours as needed for severe pain.  OXYCODONE (ROXICODONE) 15 MG IMMEDIATE RELEASE TABLET    Take 1 tablet (15 mg total) by mouth every 4 (four) hours as needed for severe pain   OXYCODONE (ROXICODONE) 15 MG IMMEDIATE RELEASE TABLET    Take 1 tablet (15 mg total) by mouth every 4 (four) hours as needed for severe pain   OXYCODONE (ROXICODONE) 30 MG IMMEDIATE RELEASE TABLET    Take 1 tablet by mouth every 4 hours as needed for pain (fill 03/19/21)   OXYCODONE (ROXICODONE) 30 MG IMMEDIATE RELEASE TABLET    Take 1 tablet (30 mg total) by mouth every 4 (four) hours as needed fo pain   OXYCODONE (ROXICODONE) 30 MG IMMEDIATE RELEASE TABLET    Take 1 tablet (30 mg total) by mouth every 4 (four) hours as needed for pain   OXYCODONE-ACETAMINOPHEN (PERCOCET) 10-325 MG TABLET    Take 1 tablet by mouth every 6 (six) hours as needed for pain.    OXYCODONE-ACETAMINOPHEN (PERCOCET) 10-325 MG TABLET    Take 1 tablet by mouth every 4 (four) hours as needed for severe pain.   OXYCODONE-ACETAMINOPHEN (PERCOCET) 10-325 MG TABLET    Take 1 tablet by mouth every 4 (four) hours as needed for severe pain.   OXYCODONE-ACETAMINOPHEN (PERCOCET) 10-325 MG TABLET    Take 1 tablet by mouth every 4 (four) hours as needed for severe pain.   OXYCODONE-ACETAMINOPHEN (PERCOCET) 10-325 MG TABLET    Take 1 tablet by mouth every four hours as needed for severe pain   OXYCODONE-ACETAMINOPHEN (PERCOCET) 10-325 MG TABLET    Take 1 tablet by mouth every 4 (four) hours as needed for severe pain.   PREDNISONE (DELTASONE) 5 MG TABLET    Take 5 mg by mouth daily with breakfast.   TADALAFIL (CIALIS) 20 MG TABLET    Take 1 tablet (20 mg total) by mouth daily as needed.   TESTOSTERONE CYPIONATE (DEPOTESTOSTERONE CYPIONATE) 200 MG/ML INJECTION    Inject 0.5 mLs into the muscle once a week.  Modified Medications   No medications on file  Discontinued Medications   No medications on file    Subjective: 71 YM with  HFrEF, A flutter, HTN, Prediabetes, Bilateral joint replacement and chronic neuropathic ulcerations of both feet, b/l foot osteomylitis SP  right  third toe amputation at the PIP joint,  left second metatarsectomy with CX+ MSSA on 12/3 SP cefazolin x 3 weeks presents for right third proximal phalanx osteomyelitis. Pt followed by Podiatry Dr. Blenda Mounts, who ordered MRI in the setting of right foot ulcer, place on doxycyline on 05/10/21. Pt is SP bedside debridement of right foot wound, per podiatry note wound appears to be improving on antibiotics.  Today 05/18/21: He reports seeing redness and oozing about a week ago from his toe. He reports he no longer has drainage following doxycyline.  Review of Systems: Review of Systems  All other systems reviewed and are negative.  Past Medical History:  Diagnosis Date   Atrial flutter ([place]) 01/2020   Cancer ([place])    prostate    Heart failure with reduced ejection fraction (HCC)    Hypertension    Hypoglycemia    occ   NICM (nonischemic cardiomyopathy) ([place]) 02/12/2020   Prostate cancer ([place]) 2019   Rheumatoid arthritis ([place])     Social History   Tobacco Use   Smoking status: Never   Smokeless tobacco: Never  Vaping Use   Vaping Use: Never used  Substance Use Topics   Alcohol use: Yes    Alcohol/week:  7.0 standard drinks    Types: 7 Standard drinks or equivalent per week   Drug use: Not Currently    Family History  Problem Relation Age of Onset   Prostate cancer Father    Prostate cancer Brother    Colon cancer Neg Hx    Rectal cancer Neg Hx    Stomach cancer Neg Hx     Allergies  Allergen Reactions   Diltiazem Hcl Swelling   Chlorthalidone Other (See Comments)    "Makes me light-headed and I don't like the way it makes me feel"   Voltaren [Diclofenac] Rash    Health Maintenance  Topic Date Due   DTAP VACCINES (1) 01/19/1951   Zoster Vaccines- Shingrix (1 of 2) Never done   COLONOSCOPY (Pts 45-68yr Insurance coverage will need to be confirmed)  Never done   Pneumonia Vaccine 69 Years old (2 - PPSV23 if available, else PCV20) 03/01/2020   INFLUENZA VACCINE  11/22/2020   COVID-19 Vaccine (5 - [place] 12/07/2020   DTaP/Tdap/Td (3 - Td or Tdap) 02/16/2029   TETANUS/TDAP  02/16/2029   Hepatitis C Screening  Completed   HPV VACCINES  Aged Out    Objective:  There were no vitals filed for this visit. There is no height or weight on file to calculate BMI.  Physical Exam Constitutional:      General: He is not in acute distress.    Appearance: He is normal weight. He is not toxic-appearing.  HENT:     Head: Normocephalic and atraumatic.     Right Ear: External ear normal.     Left Ear: External ear normal.     Nose: No congestion or rhinorrhea.     Mouth/Throat:     Mouth: Mucous membranes are moist.     Pharynx: Oropharynx is clear.  Eyes:      Extraocular Movements: Extraocular movements intact.     Conjunctiva/sclera: Conjunctivae normal.     Pupils: Pupils are equal, round, and reactive to light.  Cardiovascular:     Rate and Rhythm: Normal rate and regular rhythm.     Heart sounds: No murmur heard.   No friction rub. No gallop.  Pulmonary:     Effort: Pulmonary effort is normal.     Breath sounds: Normal breath sounds.  Abdominal:     General: Abdomen is flat. Bowel sounds are normal.     Palpations: Abdomen is soft.  Musculoskeletal:        General: No swelling. Normal range of motion.     Cervical back: Normal range of motion and neck supple.     Comments: Right 3 phalanx lateral 2 cm wound.   Skin:    General: Skin is warm and dry.  Neurological:     General: No focal deficit present.     Mental Status: He is oriented to person, place, and time.  Psychiatric:        Mood and Affect: Mood normal.    Lab Results Lab Results  Component Value Date   WBC 7.7 12/25/2020   HGB 9.1 (L) 12/25/2020   HCT 28.9 (L) 12/25/2020   MCV 81.6 12/25/2020   PLT 280 12/25/2020    Lab Results  Component Value Date   CREATININE 0.84 12/25/2020   BUN 17 12/25/2020   NA 137 12/25/2020   K 4.2 12/25/2020   CL 104 12/25/2020   CO2 25 12/25/2020    Lab Results  Component Value Date   ALT 15  03/26/2020   AST 13 (L) 03/26/2020   ALKPHOS 89 03/26/2020   BILITOT 0.5 03/26/2020    Lab Results  Component Value Date   CHOL 111 02/04/2020   HDL 32 (L) 02/04/2020   LDLCALC 65 02/04/2020   LDLDIRECT 94 12/06/2010   TRIG 72 02/04/2020   CHOLHDL 3.5 02/04/2020   Lab Results  Component Value Date   LABRPR Non Reactive 11/24/2019   No results found for: HIV1RNAQUANT, HIV1RNAVL, CD4TABS  Assessment/Plan #3rd phalanx osteomyelitis on doxycyline x1 week # Hx of b/l foot osteomyelitis with MSSA SP cefazolin x 6 weeks (EOT 05/08/20) -SP bedside debridement with podiatry -Pt would like to avoid amputation Plan -D/C  doxycyline -Hold off on antibiotics till bone Bx next week with Podiatry. Pt will call and make the appointment and I have reached out to Dr. Blenda Mounts with plan for bone Bx. Would like Bx off of antibiotics. I discussed with pt if he sees signs of infection(erythema/drainage) to start Cefadroxil. Otherwise start cefadroxil 555m PO bid after bone Bx.  -Labs today: Cbc cmp crp and esr -Ordered arterial duplex as ABI on 03/26/20 showed non-compressible vessels->if abnormal then refer to vascular surgery. -Follow-up in 1 month  MLaurice Record MD [email or web address] 05/18/2021, 9:14 AM

## 2021-05-18 NOTE — Telephone Encounter (Signed)
Per MD called [facility] to schedule appt for  [place] lower Extremity Arterial Duplex. Pt is scheduled for 05/26/21 at 9 A.M. Patient is aware of appt.  Leatrice Jewels, RMA

## 2021-05-19 ENCOUNTER — Telehealth: Payer: Self-pay

## 2021-05-19 LAB — COMPLETE METABOLIC PANEL WITH GFR
AG Ratio: 1.4 (calc) (ref 1.0–2.5)
ALT: 16 U/L (ref 9–46)
AST: 22 U/L (ref 10–35)
Albumin: 3.8 g/dL (ref 3.6–5.1)
Alkaline phosphatase (APISO): 93 U/L (ref 35–144)
BUN: 13 mg/dL (ref 7–25)
CO2: 33 mmol/L — ABNORMAL HIGH (ref 20–32)
Calcium: 8.9 mg/dL (ref 8.6–10.3)
Chloride: 102 mmol/L (ref 98–110)
Creat: 0.79 mg/dL (ref 0.70–1.35)
Globulin: 2.8 g/dL (calc) (ref 1.9–3.7)
Glucose, Bld: 80 mg/dL (ref 65–99)
Potassium: 4.6 mmol/L (ref 3.5–5.3)
Sodium: 140 mmol/L (ref 135–146)
Total Bilirubin: 0.3 mg/dL (ref 0.2–1.2)
Total Protein: 6.6 g/dL (ref 6.1–8.1)
eGFR: 97 mL/min/{1.73_m2} (ref >=60)

## 2021-05-19 LAB — CBC WITH DIFFERENTIAL/PLATELET
Absolute Monocytes: 597 cells/uL (ref 200–950)
Basophils Absolute: 19 cells/uL (ref 0–200)
Basophils Relative: 0.4 %
Eosinophils Absolute: 160 cells/uL (ref 15–500)
Eosinophils Relative: 3.4 %
HCT: 33.8 % — ABNORMAL LOW (ref 38.5–50.0)
Hemoglobin: 10.2 g/dL — ABNORMAL LOW (ref 13.2–17.1)
Lymphs Abs: 1213 cells/uL (ref 850–3900)
MCH: 23.6 pg — ABNORMAL LOW (ref 27.0–33.0)
MCHC: 30.2 g/dL — ABNORMAL LOW (ref 32.0–36.0)
MCV: 78.2 fL — ABNORMAL LOW (ref 80.0–100.0)
MPV: 10.3 fL (ref 7.5–12.5)
Monocytes Relative: 12.7 %
Neutro Abs: 2712 cells/uL (ref 1500–7800)
Neutrophils Relative %: 57.7 %
Platelets: 359 10*3/uL (ref 140–400)
RBC: 4.32 10*6/uL (ref 4.20–5.80)
RDW: 19.1 % — ABNORMAL HIGH (ref 11.0–15.0)
Total Lymphocyte: 25.8 %
WBC: 4.7 10*3/uL (ref 3.8–10.8)

## 2021-05-19 LAB — C-REACTIVE PROTEIN: CRP: 11.1 mg/L — ABNORMAL HIGH (ref <8.0)

## 2021-05-19 LAB — SEDIMENTATION RATE: Sed Rate: 17 mm/h (ref 0–20)

## 2021-05-19 NOTE — Telephone Encounter (Signed)
Thank you, information relayed to patient and patient verbalized understanding.

## 2021-05-19 NOTE — Telephone Encounter (Signed)
Patient is returning call and wants to only speak with the doctor,not a nurse at her convenience. Please advise.

## 2021-05-19 NOTE — Telephone Encounter (Signed)
Dr Candiss Norse office is calling to confirm that you are doing a bone biopsy on 2/6 when patient comes into the office ?

## 2021-05-19 NOTE — Telephone Encounter (Signed)
Patient calls in regards to the plan for him being off antibiotics for one week. Staff advised patient that ID team and Dr. Blenda Mounts have been in communication with the plan to have bone biopsy on 2/6. Staff has also confirmed this with Podiatry team, who will confirm with MD.  Advised patient that ID will return his call once we get confirmation.  [name]

## 2021-05-22 ENCOUNTER — Other Ambulatory Visit: Payer: Self-pay

## 2021-05-22 ENCOUNTER — Encounter (HOSPITAL_COMMUNITY): Payer: Self-pay | Admitting: Emergency Medicine

## 2021-05-22 ENCOUNTER — Encounter (HOSPITAL_COMMUNITY): Payer: Self-pay

## 2021-05-22 ENCOUNTER — Emergency Department (HOSPITAL_COMMUNITY): Payer: Medicare Other

## 2021-05-22 ENCOUNTER — Emergency Department (HOSPITAL_COMMUNITY)
Admission: EM | Admit: 2021-05-22 | Discharge: 2021-05-22 | Disposition: A | Discharge status: Home / Self Care | Payer: Medicare Other | Attending: Emergency Medicine | Admitting: Emergency Medicine

## 2021-05-22 ENCOUNTER — Ambulatory Visit (HOSPITAL_COMMUNITY)
Admission: EM | Admit: 2021-05-22 | Discharge: 2021-05-22 | Disposition: A | Discharge status: Home / Self Care | Payer: Medicare Other | Attending: Physician Assistant | Admitting: Physician Assistant

## 2021-05-22 DIAGNOSIS — I4892 Unspecified atrial flutter: Secondary | ICD-10-CM

## 2021-05-22 DIAGNOSIS — T7840XA Allergy, unspecified, initial encounter: Secondary | ICD-10-CM | POA: Diagnosis not present

## 2021-05-22 DIAGNOSIS — R000 Tachycardia, unspecified: Secondary | ICD-10-CM | POA: Diagnosis present

## 2021-05-22 DIAGNOSIS — L539 Erythematous condition, unspecified: Secondary | ICD-10-CM | POA: Insufficient documentation

## 2021-05-22 DIAGNOSIS — I483 Typical atrial flutter: Secondary | ICD-10-CM | POA: Diagnosis not present

## 2021-05-22 DIAGNOSIS — T361X5A Adverse effect of cephalosporins and other beta-lactam antibiotics, initial encounter: Secondary | ICD-10-CM | POA: Diagnosis not present

## 2021-05-22 DIAGNOSIS — R21 Rash and other nonspecific skin eruption: Secondary | ICD-10-CM | POA: Diagnosis not present

## 2021-05-22 DIAGNOSIS — Z79899 Other long term (current) drug therapy: Secondary | ICD-10-CM | POA: Diagnosis not present

## 2021-05-22 DIAGNOSIS — Z7901 Long term (current) use of anticoagulants: Secondary | ICD-10-CM | POA: Insufficient documentation

## 2021-05-22 DIAGNOSIS — L509 Urticaria, unspecified: Secondary | ICD-10-CM

## 2021-05-22 DIAGNOSIS — I480 Paroxysmal atrial fibrillation: Secondary | ICD-10-CM | POA: Diagnosis not present

## 2021-05-22 DIAGNOSIS — R0602 Shortness of breath: Secondary | ICD-10-CM

## 2021-05-22 LAB — BASIC METABOLIC PANEL
Anion gap: 10 (ref 5–15)
BUN: 22 mg/dL (ref 8–23)
CO2: 23 mmol/L (ref 22–32)
Calcium: 8.6 mg/dL — ABNORMAL LOW (ref 8.9–10.3)
Chloride: 105 mmol/L (ref 98–111)
Creatinine, Ser: 0.86 mg/dL (ref 0.61–1.24)
GFR, Estimated: >60 mL/min (ref >60)
Glucose, Bld: 173 mg/dL — ABNORMAL HIGH (ref 70–99)
Potassium: 3.6 mmol/L (ref 3.5–5.1)
Sodium: 138 mmol/L (ref 135–145)

## 2021-05-22 LAB — CBC WITH DIFFERENTIAL/PLATELET
Abs Immature Granulocytes: 0.04 10*3/uL (ref 0.00–0.07)
Basophils Absolute: 0 10*3/uL (ref 0.0–0.1)
Basophils Relative: 0 %
Eosinophils Absolute: 0.1 10*3/uL (ref 0.0–0.5)
Eosinophils Relative: 1 %
HCT: 37.7 % — ABNORMAL LOW (ref 39.0–52.0)
Hemoglobin: 11.3 g/dL — ABNORMAL LOW (ref 13.0–17.0)
Immature Granulocytes: 1 %
Lymphocytes Relative: 7 %
Lymphs Abs: 0.6 10*3/uL — ABNORMAL LOW (ref 0.7–4.0)
MCH: 23.7 pg — ABNORMAL LOW (ref 26.0–34.0)
MCHC: 30 g/dL (ref 30.0–36.0)
MCV: 79.2 fL — ABNORMAL LOW (ref 80.0–100.0)
Monocytes Absolute: 0.4 10*3/uL (ref 0.1–1.0)
Monocytes Relative: 4 %
Neutro Abs: 7.6 10*3/uL (ref 1.7–7.7)
Neutrophils Relative %: 87 %
Platelets: 313 10*3/uL (ref 150–400)
RBC: 4.76 MIL/uL (ref 4.22–5.81)
RDW: 22 % — ABNORMAL HIGH (ref 11.5–15.5)
WBC: 8.6 10*3/uL (ref 4.0–10.5)
nRBC: 0 % (ref 0.0–0.2)

## 2021-05-22 LAB — MAGNESIUM: Magnesium: 1.8 mg/dL (ref 1.7–2.4)

## 2021-05-22 IMAGING — DX DG CHEST 1V PORT
2 series · 2 of 2 positions shown · non-contrast
Comparison: [DATE]

CLINICAL DATA: sob

EXAM:
PORTABLE CHEST 1 VIEW

[chest ap (1 of 2)]
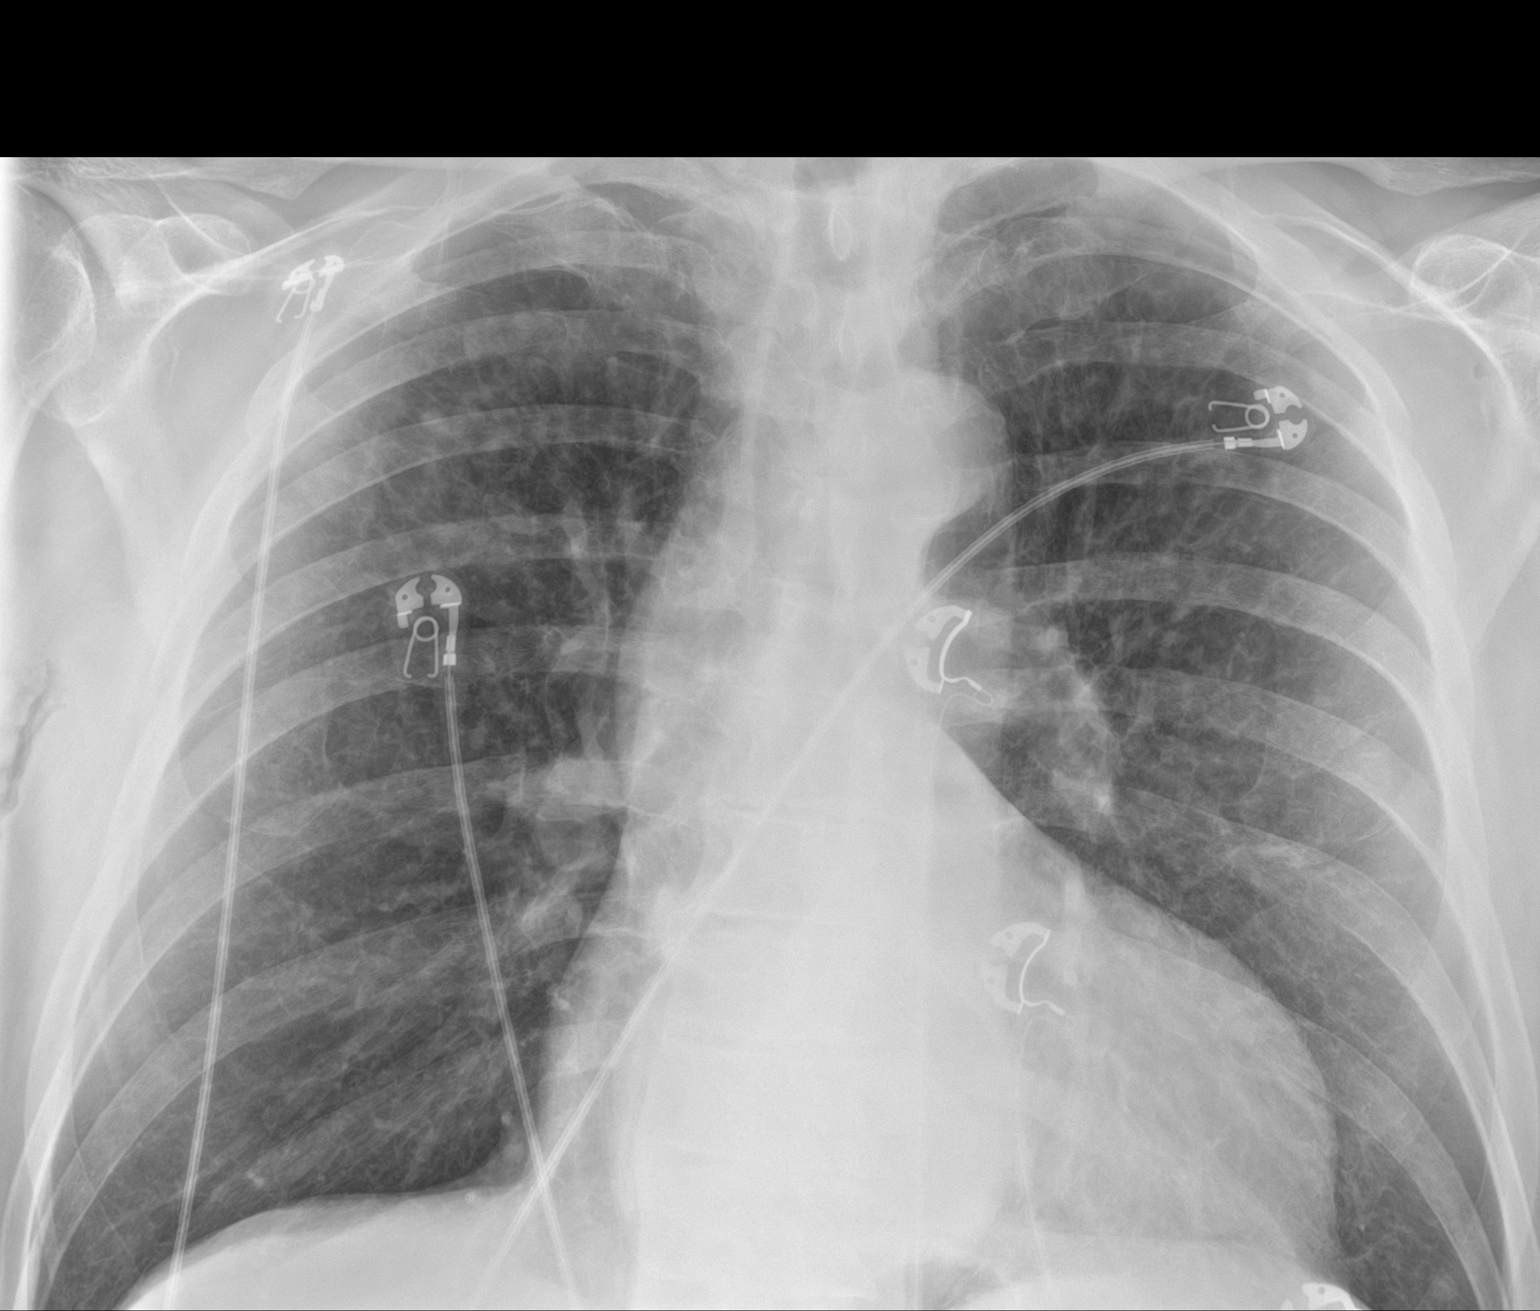

[chest ap (2 of 2)]
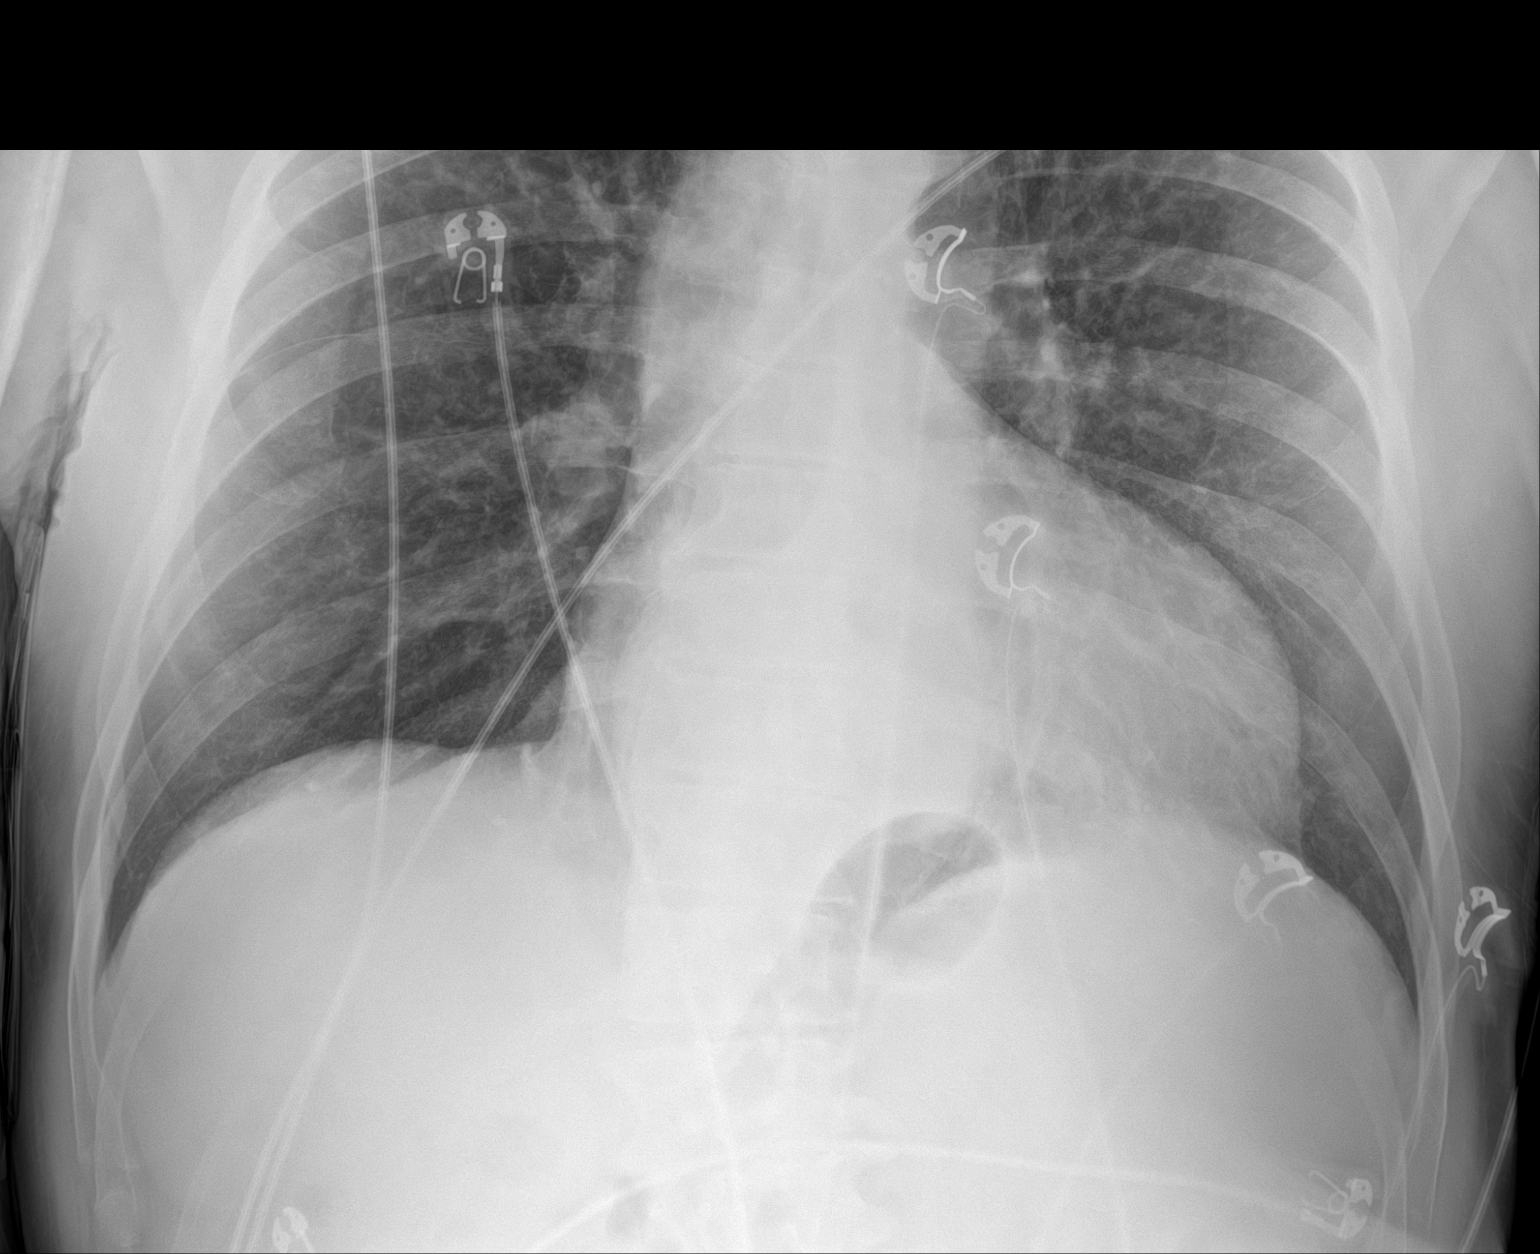

[2 of 2 positions shown; findings below may reference images not displayed]

FINDINGS: The cardiomediastinal silhouette is unchanged in contour. No pleural
effusion. No pneumothorax. No acute pleuroparenchymal abnormality.
Visualized abdomen is unremarkable.
IMPRESSION: No acute cardiopulmonary abnormality.

## 2021-05-22 MED ORDER — FAMOTIDINE IN NACL 20-0.9 MG/50ML-% IV SOLN
20.0000 mg | Freq: Once | INTRAVENOUS | Status: AC
  Administered 2021-05-22: 20 mg via INTRAVENOUS
  Filled 2021-05-22: qty 50

## 2021-05-22 MED ORDER — SODIUM CHLORIDE 0.9 % IV BOLUS
500.0000 mL | Freq: Once | INTRAVENOUS | Status: AC
  Administered 2021-05-22: 500 mL via INTRAVENOUS

## 2021-05-22 MED ORDER — DIPHENHYDRAMINE HCL 50 MG/ML IJ SOLN
25.0000 mg | Freq: Once | INTRAMUSCULAR | Status: AC
  Administered 2021-05-22: 25 mg via INTRAVENOUS
  Filled 2021-05-22: qty 1

## 2021-05-22 MED ORDER — DEXAMETHASONE SODIUM PHOSPHATE 10 MG/ML IJ SOLN
10.0000 mg | Freq: Once | INTRAMUSCULAR | Status: AC
  Administered 2021-05-22: 10 mg via INTRAVENOUS
  Filled 2021-05-22: qty 1

## 2021-05-22 MED ORDER — METOPROLOL TARTRATE 5 MG/5ML IV SOLN
5.0000 mg | Freq: Once | INTRAVENOUS | Status: DC
  Filled 2021-05-22: qty 5

## 2021-05-22 NOTE — ED Triage Notes (Signed)
Patient started taking cefadroxil today.  The first dose was around 12 or 1pm.  Within an hour started itching, rash.  Initially could not catch his breath.  Denies swelling of tongue or throat.  Mouth is dry.  Patient has not had any further medications

## 2021-05-22 NOTE — ED Triage Notes (Addendum)
Pt [facility] for tachycardia. Pt went to urgent care to be seen for rash/hives and itching after starting cefadroxil today. Urgent care found his HR to be elevated and his heart rhythm to be a-flutter. Hx of a-flutter. [facility] reports heart rhythm of ST; ST on arrival. Pt denies CP/SHOB. Pt was prescribed ABX for a toe infection on his right foot.   20 Right hand 95% RA HR 124

## 2021-05-22 NOTE — ED Provider Notes (Signed)
[place]    CSN: [id number] Arrival date & time: 05/22/21  1626      History   Chief Complaint Chief Complaint  Patient presents with   Allergic Reaction    HPI Thomas [name] is a 71 y.o. male.   Patient presents today with a widespread pruritic erythematous rash following initiation of new antibiotic.  He has a history of recurrence foot infections with osteomyelitis and is followed by podiatry.  He had been doing well on doxycycline for several weeks but they had him discontinue this medication as the intent to perform a bone biopsy in the near future.  He was given cefadroxil in case he develops worsening symptoms he took his first dose earlier today as result of increased foot pain and erythema.  Approximately 1 hour after this he developed widespread itching rash with associated shortness of breath.  He denies any swelling of his mouth or tongue.  He denies any history of anaphylactic reactions in the past.  He denies lightheadedness, weakness, chest pain, nausea, vomiting but did have bowel movement urgently recently.  He does have a history of atrial flutter and is followed by cardiology but has been doing well on current medication regimen without recurrent episodes of flutter.   Past Medical History:  Diagnosis Date   Atrial flutter ([place]) 01/2020   Cancer ([place])    prostate   Heart failure with reduced ejection fraction ([place])    Hypertension    Hypoglycemia    occ   NICM (nonischemic cardiomyopathy) ([place]) 02/12/2020   Prostate cancer ([place]) 2019   Rheumatoid arthritis [facility])     Patient Active Problem List   Diagnosis Date Noted   Fracture of intertrochanteric section of femur, closed, right, with nonunion, subsequent encounter 12/24/2020   Inguinal hernia 10/18/2020   Chronic combined systolic and diastolic CHF, NYHA class 2 ([place])    Osteoporosis 05/05/2020   Hip fracture ([place]) 05/04/2020   Acute osteomyelitis of toe, right (HCC)    Foot infection     NICM (nonischemic cardiomyopathy) ([place]), no significant CAD on cardiac cath   02/12/2020   Right foot pain    Rheumatoid arthritis ([place])    Atrial flutter ([place]) 02/02/2020   Lower extremity edema 12/30/2019   Weight loss, non-intentional 11/24/2019   Chronic right hip pain 06/19/2019   Degenerative tear of acetabular labrum of right hip 05/02/2019   Primary osteoarthritis of right hip 05/02/2019   Skin ulcer ([place]) 01/29/2019   Malignant neoplasm of prostate ([place]) 01/16/2018   Insomnia 10/02/2017   S/P bilateral TKA 09/13/2015   Other bilateral secondary osteoarthritis of knee 07/14/2014   Heme positive stool 05/21/2013   Osteoarthritis, multiple sites 01/13/2011   Hypertension 12/06/2010   CLAUSTROPHOBIA 03/01/2010    Past Surgical History:  Procedure Laterality Date   AMPUTATION TOE Right 12/2019   AMPUTATION TOE Right 03/26/2020   Procedure: Right 3rd toe partial amputation, bone biopsy right 1st metatarsal;  Surgeon: Evelina Bucy, DPM;  Location: [facility];  Service: Podiatry;  Laterality: Right;   BUBBLE STUDY  02/11/2020   Procedure: BUBBLE STUDY;  Surgeon: Geralynn Rile, MD;  Location: [place];  Service: Cardiovascular;;   CARDIOVERSION N/A 02/11/2020   Procedure: CARDIOVERSION;  Surgeon: Geralynn Rile, MD;  Location: [place];  Service: Cardiovascular;  Laterality: N/A;   CYSTOSCOPY  04/11/2018   Procedure: Erlene Quan;  Surgeon: [name], MD;  Location: [facility];  Service: Urology;;  NO SEEDS  FOUND IN BLADDER   HERNIA REPAIR  2009   inguinal   INTRAMEDULLARY (IM) NAIL INTERTROCHANTERIC Right 05/05/2020   Procedure: CPT 27245-Cephalomedullary nailing of right intertrochanteric femur fracture;  Surgeon: Shona Needles, MD;  Location: [place];  Service: Orthopedics;  Laterality: Right;   INTRAMEDULLARY (IM) NAIL INTERTROCHANTERIC Right 12/24/2020   Procedure: REVISION FIXATION OF RIGHT INTERTROCHANTERIC FEMUR  NONUNION;  Surgeon: Shona Needles, MD;  Location: [place];  Service: Orthopedics;  Laterality: Right;   IRRIGATION AND DEBRIDEMENT FOOT Left 03/26/2020   Procedure: Left foot incision and drainage with removal of all non-viable soft tissue and bone - areas overlying the 2nd and 5th metatarsals.;  Surgeon: Evelina Bucy, DPM;  Location: [facility];  Service: Podiatry;  Laterality: Left;   RADIOACTIVE SEED IMPLANT N/A 04/11/2018   Procedure: RADIOACTIVE SEED IMPLANT/BRACHYTHERAPY IMPLANT;  Surgeon: [name], MD;  Location: [facility];  Service: Urology;  Laterality: N/A;   53     SEEDS IMPLANTED   RIGHT/LEFT HEART CATH AND CORONARY ANGIOGRAPHY N/A 02/09/2020   Procedure: RIGHT/LEFT HEART CATH AND CORONARY ANGIOGRAPHY;  Surgeon: Nelva Bush, MD;  Location: [facility];  Service: Cardiovascular;  Laterality: N/A;   SPACE OAR INSTILLATION N/A 04/11/2018   Procedure: SPACE OAR INSTILLATION;  Surgeon: [name], MD;  Location: [facility];  Service: Urology;  Laterality: N/A;   TEE WITHOUT CARDIOVERSION N/A 02/11/2020   Procedure: TRANSESOPHAGEAL ECHOCARDIOGRAM (TEE);  Surgeon: Geralynn Rile, MD;  Location: [place];  Service: Cardiovascular;  Laterality: N/A;   TOTAL KNEE ARTHROPLASTY Bilateral 09/13/2015   Procedure: BILATERAL KNEE ARTHROPLASTY ;  Surgeon: Paralee Cancel, MD;  Location: [facility];  Service: Orthopedics;  Laterality: Bilateral;       Home Medications    Prior to Admission medications   Medication Sig Start Date End Date Taking? Authorizing Provider  amiodarone (PACERONE) 200 MG tablet Take 1 tablet (200 mg total) by mouth daily. 03/31/20   Burtis Junes, NP  apixaban (ELIQUIS) 5 MG TABS tablet Take 1 tablet (5 mg total) by mouth 2 (two) times daily. 12/24/20   Corinne Ports, PA-C  cefadroxil (DURICEF) 500 MG capsule Take 1 capsule (500 mg total) by mouth 2 (two) times daily. 05/18/21 06/22/21  Laurice Record, MD   diazepam (VALIUM) 5 MG tablet 5 mg at bedtime as needed for bladder spasms. Use rectally 11/09/20   [provider]  diazepam (VALIUM) 5 MG tablet Take 1 tablet (5 mg total) by mouth at bedtime as needed. 02/11/21     DULoxetine (CYMBALTA) 60 MG capsule TAKE 1 CAPSULE BY MOUTH EVERY DAY - PLS MAKE AN APPOINTMENT FOR AN OFFICE VISIT 02/25/21   [name], MD  lisinopril (ZESTRIL) 10 MG tablet Take 1 tablet (10 mg total) by mouth daily. Please make yearly appt with Dr. Johney Frame for February 2023 for future refills. Thank you 1st attempt 03/28/21   Freada Bergeron, MD  methocarbamol (ROBAXIN) 500 MG tablet Take 1 tablet (500 mg total) by mouth 3 (three) times daily as needed for muscle spasms 01/10/21     methocarbamol (ROBAXIN) 500 MG tablet Take 1 tablet (500 mg total) by mouth 3 (three) times daily for muscle spasms 02/03/21     metoprolol succinate (TOPROL XL) 25 MG 24 hr tablet Take 0.5 tablets (12.5 mg total) by mouth daily. 05/25/20   Freada Bergeron, MD  NARCAN 4 MG/0.1ML LIQD nasal spray kit Place 1 spray into the  nose as needed (as directed for emergency). 02/11/20   Sande Rives E, PA-C  oxyCODONE (OXY IR/ROXICODONE) 5 MG immediate release tablet Take 1 tablet (5 mg total) by mouth every 4 (four) hours as needed for pain. 12/30/20     oxyCODONE (OXY IR/ROXICODONE) 5 MG immediate release tablet Take 1 tablet (5 mg total) by mouth every 4 (four) hours as needed for pain. 01/10/21     oxyCODONE (OXY IR/ROXICODONE) 5 MG immediate release tablet Take 1 tablet by mouth every four hours as needed for pain 01/14/21     oxyCODONE (ROXICODONE) 15 MG immediate release tablet Take 1 tablet (15 mg total) by mouth every 4 (four) hours as needed for severe pain. 01/28/21     oxyCODONE (ROXICODONE) 15 MG immediate release tablet Take 1 tablet (15 mg total) by mouth every 4 (four) hours as needed for severe pain 02/07/21     oxyCODONE (ROXICODONE) 15 MG immediate release tablet Take 1  tablet (15 mg total) by mouth every 4 (four) hours as needed for severe pain 02/16/21     oxycodone (ROXICODONE) 30 MG immediate release tablet Take 1 tablet by mouth every 4 hours as needed for pain (fill 03/19/21) 03/15/21     oxycodone (ROXICODONE) 30 MG immediate release tablet Take 1 tablet (30 mg total) by mouth every 4 (four) hours as needed fo pain 03/25/21     oxycodone (ROXICODONE) 30 MG immediate release tablet Take 1 tablet (30 mg total) by mouth every 4 (four) hours as needed for pain 04/01/21     oxyCODONE-acetaminophen (PERCOCET) 10-325 MG tablet Take 1 tablet by mouth every 6 (six) hours as needed for pain. 12/24/20   Corinne Ports, PA-C  oxyCODONE-acetaminophen (PERCOCET) 10-325 MG tablet Take 1 tablet by mouth every 4 (four) hours as needed for severe pain. 12/28/20     oxyCODONE-acetaminophen (PERCOCET) 10-325 MG tablet Take 1 tablet by mouth every 4 (four) hours as needed for severe pain. 12/30/20     oxyCODONE-acetaminophen (PERCOCET) 10-325 MG tablet Take 1 tablet by mouth every 4 (four) hours as needed for severe pain. 01/10/21     oxyCODONE-acetaminophen (PERCOCET) 10-325 MG tablet Take 1 tablet by mouth every four hours as needed for severe pain 01/14/21     oxyCODONE-acetaminophen (PERCOCET) 10-325 MG tablet Take 1 tablet by mouth every 4 (four) hours as needed for severe pain. 01/24/21     predniSONE (DELTASONE) 5 MG tablet Take 5 mg by mouth daily with breakfast.    [provider]  tadalafil (CIALIS) 20 MG tablet Take 1 tablet (20 mg total) by mouth daily as needed. 01/18/21   Karen Kays, NP  testosterone cypionate (DEPOTESTOSTERONE CYPIONATE) 200 MG/ML injection Inject 0.5 mLs into the muscle once a week. 09/08/20   [provider]    Family History Family History  Problem Relation Age of Onset   Prostate cancer Father    Prostate cancer Brother    Colon cancer Neg Hx    Rectal cancer Neg Hx    Stomach cancer Neg Hx     Social History Social  History   Tobacco Use   Smoking status: Never   Smokeless tobacco: Never  Vaping Use   Vaping Use: Never used  Substance Use Topics   Alcohol use: Yes    Alcohol/week: 7.0 standard drinks    Types: 7 Standard drinks or equivalent per week   Drug use: Not Currently     Allergies   Diltiazem hcl, Chlorthalidone, and  Voltaren [diclofenac]   Review of Systems Review of Systems  Constitutional:  Positive for activity change. Negative for appetite change, fatigue and fever.  HENT:  Negative for sore throat, trouble swallowing and voice change.   Respiratory:  Positive for shortness of breath. Negative for cough.   Cardiovascular:  Negative for chest pain, palpitations and leg swelling.  Gastrointestinal:  Positive for diarrhea. Negative for abdominal pain, nausea and vomiting.  Skin:  Positive for color change and wound.  Neurological:  Negative for dizziness, weakness, light-headedness and headaches.    Physical Exam Triage Vital Signs ED Triage Vitals  Enc Vitals Group     BP 05/22/21 1633 (!) 113/92     Pulse Rate 05/22/21 1633 (!) 134     Resp 05/22/21 1633 (!) 26     Temp 05/22/21 1633 (!) 97.5 F (36.4 C)     Temp Source 05/22/21 1633 Oral     SpO2 05/22/21 1633 95 %     Weight --      Height --      Head Circumference --      Peak Flow --      Pain Score 05/22/21 1631 0     Pain Loc --      Pain Edu? --      Excl. in [place]? --    No data found.  Updated Vital Signs BP (!) 113/92 (BP Location: Right Arm)    Pulse (!) 133    Temp (!) 97.5 F (36.4 C) (Oral)    Resp (!) 26    SpO2 95%   Visual Acuity Right Eye Distance:   Left Eye Distance:   Bilateral Distance:    Right Eye Near:   Left Eye Near:    Bilateral Near:     Physical Exam Vitals reviewed.  Constitutional:      General: He is awake.     Appearance: Normal appearance. He is well-developed. He is not ill-appearing.     Comments: Appears stated age in no acute distress sitting comfortably in  exam room  HENT:     Head: Normocephalic and atraumatic.     Mouth/Throat:     Pharynx: No oropharyngeal exudate, posterior oropharyngeal erythema or uvula swelling.  Cardiovascular:     Rate and Rhythm: Regular rhythm. Tachycardia present.     Heart sounds: Normal heart sounds, S1 normal and S2 normal. No murmur heard. Pulmonary:     Effort: Pulmonary effort is normal.     Breath sounds: Normal breath sounds. No stridor. No wheezing, rhonchi or rales.     Comments: Clear to auscultation bilaterally Abdominal:     General: Bowel sounds are normal.     Palpations: Abdomen is soft.     Tenderness: There is no abdominal tenderness.  Skin:    Findings: Rash present.  Neurological:     Mental Status: He is alert.  Psychiatric:        Behavior: Behavior is cooperative.     UC Treatments / Results  Labs (all labs ordered are listed, but only abnormal results are displayed) Labs Reviewed - No data to display  EKG   Radiology No results found.  Procedures Procedures (including critical care time)  Medications Ordered in UC Medications - No data to display  Initial Impression / Assessment and Plan / UC Course  I have reviewed the triage vital signs and the nursing notes.  Pertinent labs & imaging results that were available during my care of the patient  were reviewed by me and considered in my medical decision making (see chart for details).     EKG obtained given tachycardia and shortness of breath episode atrial flutter with ventricular rate of 138 bpm.  Discussed need for emergency evaluation and patient was transported to the ER by EMS.  Patient's wife was informed of decision making and will accompany him to the emergency room.  Discussed case with Dr. Windy Carina who agreed with treatment plan.  Final Clinical Impressions(s) / UC Diagnoses   Final diagnoses:  Typical atrial flutter (HCC)  SOB (shortness of breath)  Allergic reaction, initial encounter  Urticaria    Discharge Instructions   None    ED Prescriptions   None    PDMP not reviewed this encounter.   Terrilee Croak, PA-C 05/22/21 1715

## 2021-05-22 NOTE — ED Notes (Signed)
Notified erin, pa of patient .

## 2021-05-22 NOTE — Discharge Instructions (Signed)
You were seen in the emergency department for an allergic reaction and elevated heart rate.  Your EKG showed you to be in atrial flutter.  Your lab work was unremarkable and you received some IV fluids along with allergy medications with improvement in your symptoms.  Please contact your cardiologist regarding your atrial flutter.  Return to the emergency department if any worsening or concerning symptoms.

## 2021-05-22 NOTE — ED Provider Notes (Signed)
[facility]ER[facility] DEPARTMENT Provider Note   CSN: [id number] Arrival date & time:        History  Chief Complaint  Patient presents with   Tachycardia    Thomas [name] is a 71 y.o. male.  He has a history of a flutter and is on Eliquis and amiodarone.  He has been troubled by a foot infection and is on antibiotics for that.  They told him to stop the doxycycline as they wanted to do a wound culture.  They gave him a prescription for cefadroxil to take if he started having worsening symptoms.  He began having a lot of pain in his foot and so took the first dose today.  Few hours after that he broke out in hives and was itching.  Felt short of breath.  Went to urgent care were found to be in atrial flutter.  They did not give him any medications and transferred him here for evaluation.  On arrival here he actually states he is feeling better from a rash standpoint and from shortness of breath tachycardic standpoint.  Said he is needed to be cardioverted once before.  The history is provided by the patient and the spouse.  Allergic Reaction Presenting symptoms: difficulty breathing, itching and rash   Difficulty breathing:    Severity:  Moderate   Onset quality:  Sudden   Timing:  Constant   Progression:  Resolved Severity:  Moderate Duration:  4 hours Prior allergic episodes:  No prior episodes Context: medications   Relieved by:  None tried Worsened by:  Nothing Ineffective treatments:  None tried     Home Medications Prior to Admission medications   Medication Sig Start Date End Date Taking? Authorizing Provider  amiodarone (PACERONE) 200 MG tablet Take 1 tablet (200 mg total) by mouth daily. 03/31/20   Burtis Junes, NP  apixaban (ELIQUIS) 5 MG TABS tablet Take 1 tablet (5 mg total) by mouth 2 (two) times daily. 12/24/20   Corinne Ports, PA-C  cefadroxil (DURICEF) 500 MG capsule Take 1 capsule (500 mg total) by mouth 2 (two) times daily. 05/18/21  06/22/21  Laurice Record, MD  diazepam (VALIUM) 5 MG tablet 5 mg at bedtime as needed for bladder spasms. Use rectally 11/09/20   [provider]  diazepam (VALIUM) 5 MG tablet Take 1 tablet (5 mg total) by mouth at bedtime as needed. 02/11/21     DULoxetine (CYMBALTA) 60 MG capsule TAKE 1 CAPSULE BY MOUTH EVERY DAY - PLS MAKE AN APPOINTMENT FOR AN OFFICE VISIT 02/25/21   [name], MD  lisinopril (ZESTRIL) 10 MG tablet Take 1 tablet (10 mg total) by mouth daily. Please make yearly appt with Dr. Johney Frame for February 2023 for future refills. Thank you 1st attempt 03/28/21   Freada Bergeron, MD  methocarbamol (ROBAXIN) 500 MG tablet Take 1 tablet (500 mg total) by mouth 3 (three) times daily as needed for muscle spasms 01/10/21     methocarbamol (ROBAXIN) 500 MG tablet Take 1 tablet (500 mg total) by mouth 3 (three) times daily for muscle spasms 02/03/21     metoprolol succinate (TOPROL XL) 25 MG 24 hr tablet Take 0.5 tablets (12.5 mg total) by mouth daily. 05/25/20   Freada Bergeron, MD  NARCAN 4 MG/0.1ML LIQD nasal spray kit Place 1 spray into the nose as needed (as directed for emergency). 02/11/20   Sande Rives E, PA-C  oxyCODONE (OXY IR/ROXICODONE) 5 MG immediate release tablet  Take 1 tablet (5 mg total) by mouth every 4 (four) hours as needed for pain. 12/30/20     oxyCODONE (OXY IR/ROXICODONE) 5 MG immediate release tablet Take 1 tablet (5 mg total) by mouth every 4 (four) hours as needed for pain. 01/10/21     oxyCODONE (OXY IR/ROXICODONE) 5 MG immediate release tablet Take 1 tablet by mouth every four hours as needed for pain 01/14/21     oxyCODONE (ROXICODONE) 15 MG immediate release tablet Take 1 tablet (15 mg total) by mouth every 4 (four) hours as needed for severe pain. 01/28/21     oxyCODONE (ROXICODONE) 15 MG immediate release tablet Take 1 tablet (15 mg total) by mouth every 4 (four) hours as needed for severe pain 02/07/21     oxyCODONE (ROXICODONE) 15 MG  immediate release tablet Take 1 tablet (15 mg total) by mouth every 4 (four) hours as needed for severe pain 02/16/21     oxycodone (ROXICODONE) 30 MG immediate release tablet Take 1 tablet by mouth every 4 hours as needed for pain (fill 03/19/21) 03/15/21     oxycodone (ROXICODONE) 30 MG immediate release tablet Take 1 tablet (30 mg total) by mouth every 4 (four) hours as needed fo pain 03/25/21     oxycodone (ROXICODONE) 30 MG immediate release tablet Take 1 tablet (30 mg total) by mouth every 4 (four) hours as needed for pain 04/01/21     oxyCODONE-acetaminophen (PERCOCET) 10-325 MG tablet Take 1 tablet by mouth every 6 (six) hours as needed for pain. 12/24/20   Corinne Ports, PA-C  oxyCODONE-acetaminophen (PERCOCET) 10-325 MG tablet Take 1 tablet by mouth every 4 (four) hours as needed for severe pain. 12/28/20     oxyCODONE-acetaminophen (PERCOCET) 10-325 MG tablet Take 1 tablet by mouth every 4 (four) hours as needed for severe pain. 12/30/20     oxyCODONE-acetaminophen (PERCOCET) 10-325 MG tablet Take 1 tablet by mouth every 4 (four) hours as needed for severe pain. 01/10/21     oxyCODONE-acetaminophen (PERCOCET) 10-325 MG tablet Take 1 tablet by mouth every four hours as needed for severe pain 01/14/21     oxyCODONE-acetaminophen (PERCOCET) 10-325 MG tablet Take 1 tablet by mouth every 4 (four) hours as needed for severe pain. 01/24/21     predniSONE (DELTASONE) 5 MG tablet Take 5 mg by mouth daily with breakfast.    [provider]  tadalafil (CIALIS) 20 MG tablet Take 1 tablet (20 mg total) by mouth daily as needed. 01/18/21   Karen Kays, NP  testosterone cypionate (DEPOTESTOSTERONE CYPIONATE) 200 MG/ML injection Inject 0.5 mLs into the muscle once a week. 09/08/20   [provider]      Allergies    Diltiazem hcl, Chlorthalidone, and Voltaren [diclofenac]    Review of Systems   Review of Systems  Constitutional:  Negative for fever.  HENT:  Negative for sore throat.    Eyes:  Negative for visual disturbance.  Respiratory:  Positive for shortness of breath.   Cardiovascular:  Negative for chest pain.  Gastrointestinal:  Negative for abdominal pain.  Genitourinary:  Negative for dysuria.  Musculoskeletal:  Negative for back pain.  Skin:  Positive for itching and rash.  Neurological:  Negative for headaches.   Physical Exam Updated Vital Signs BP 118/87    Pulse (!) 122    Resp 16    SpO2 96%  Physical Exam Vitals and nursing note reviewed.  Constitutional:      General: He is not in acute  distress.    Appearance: Normal appearance. He is well-developed.  HENT:     Head: Normocephalic and atraumatic.  Eyes:     Conjunctiva/sclera: Conjunctivae normal.  Cardiovascular:     Rate and Rhythm: Regular rhythm. Tachycardia present.     Heart sounds: No murmur heard. Pulmonary:     Effort: Pulmonary effort is normal. No respiratory distress.     Breath sounds: Normal breath sounds. No wheezing.  Abdominal:     Palpations: Abdomen is soft.     Tenderness: There is no abdominal tenderness.  Musculoskeletal:        General: Tenderness present.     Cervical back: Neck supple.  Skin:    General: Skin is warm and dry.     Capillary Refill: Capillary refill takes less than 2 seconds.     Findings: Rash present.     Comments: He is some erythema.  Hives have resolved.  Neurological:     General: No focal deficit present.     Mental Status: He is alert.    ED Results / Procedures / Treatments   Labs (all labs ordered are listed, but only abnormal results are displayed) Labs Reviewed  BASIC METABOLIC PANEL - Abnormal; Notable for the following components:      Result Value   Glucose, Bld 173 (*)    Calcium 8.6 (*)    All other components within normal limits  CBC WITH DIFFERENTIAL/PLATELET - Abnormal; Notable for the following components:   Hemoglobin 11.3 (*)    HCT 37.7 (*)    MCV 79.2 (*)    MCH 23.7 (*)    RDW 22.0 (*)    Lymphs Abs 0.6  (*)    All other components within normal limits  MAGNESIUM    EKG EKG Interpretation  Date/Time:  Sunday May 22 2021 17:57:23 EST Ventricular Rate:  122 PR Interval:  178 QRS Duration: 84 QT Interval:  364 QTC Calculation: 519 R Axis:   32 Text Interpretation: probable atrial flutter Anteroseptal infarct, age indeterminate ST elevation, consider inferior injury Prolonged QT interval similar to prior today Confirmed by Aletta Edouard [phone]) on 05/22/2021 6:09:39 PM  Radiology DG Chest Port 1 View  Result Date: 05/22/2021 CLINICAL DATA:  sob EXAM: PORTABLE CHEST 1 VIEW COMPARISON:  February 03, 2020 FINDINGS: The cardiomediastinal silhouette is unchanged in contour. No pleural effusion. No pneumothorax. No acute pleuroparenchymal abnormality. Visualized abdomen is unremarkable. IMPRESSION: No acute cardiopulmonary abnormality. Electronically Signed   By: Valentino Saxon M.D.   On: 05/22/2021 19:24    Procedures Procedures    Medications Ordered in ED Medications  diphenhydrAMINE (BENADRYL) injection 25 mg (has no administration in time range)  dexamethasone (DECADRON) injection 10 mg (has no administration in time range)  famotidine (PEPCID) IVPB 20 mg premix (has no administration in time range)  sodium chloride 0.9 % bolus 500 mL (has no administration in time range)    ED Course/ Medical Decision Making/ A&P Clinical Course as of 05/22/21 2144  Nancy Fetter May 22, 2021  1828 Last cardiac echo 5/22 showed EF of 55 to 60%. [MB]  [id number] Patient feels better from allergic reaction standpoint.  He does not feel short of breath and does not feel his heart racing.  No chest pain.  Offered to give him an IV dose of some Lopressor prior to discharge.  Do not feel he needs cardioversion as he is on anticoagulation and not symptomatic.  Patient states he rather just go home and take his  medicine and follow-up with cardiology. [MB]  1912 Chest x-ray interpreted by me as no acute  infiltrates.  Awaiting radiology reading. [MB]    Clinical Course User Index [MB] Hayden Rasmussen, MD                           Medical Decision Making Amount and/or Complexity of Data Reviewed Labs: ordered. Radiology: ordered.  Risk Prescription drug management.  This patient complains of hives shortness of breath; this involves an extensive number of treatment Options and is a complaint that carries with it a high risk of complications and Morbidity. The differential includes allergic reaction, A. fib, a flutter, pneumonia pneumothorax, anemia, metabolic derangement  I ordered, reviewed and interpreted labs, which included CBC with normal white count, hemoglobin low better than priors, chemistries with elevated glucose, magnesium normal I ordered medication steroids fluids Pepcid Benadryl for allergic reaction I ordered imaging studies which included chest x-ray and I independently    visualized and interpreted imaging which showed no acute findings Additional history obtained from patient's wife Previous records obtained and reviewed in epic including prior urgent care visit today  After the interventions stated above, I reevaluated the patient and found patient to likely still be in a flutter.  He is otherwise asymptomatic from this.  He feels improved from his allergic reaction.  Offered beta-blocker to see if we can slow his heart rate down although patient states he is asymptomatic and wants to go home and take his home metoprolol.  Recommended close follow-up with his cardiologist as he is already anticoagulated and medically risk for a flutter complication.  No indications for cardioversion at this time.  He is otherwise asymptomatic and does not need to be admitted to the hospital at this time.  Return instructions discussed          Final Clinical Impression(s) / ED Diagnoses Final diagnoses:  Atrial flutter, paroxysmal ([place])  Allergic reaction, initial encounter     Rx / DC Orders ED Discharge Orders     None         Hayden Rasmussen, MD 05/22/21 2148

## 2021-05-22 NOTE — ED Notes (Signed)
Attempted to call charge nurse x 2 no answers.  Notified carelink

## 2021-05-22 NOTE — ED Notes (Signed)
Patient is being discharged from the Urgent Care and sent to the Emergency Department via carelink . Per Verna Czech, PA, patient is in need of higher level of care due to Atrial Flutter/ allergic reaction (rash/itching). Patient is aware and verbalizes understanding of plan of care.  Vitals:   05/22/21 1633 05/22/21 1656  BP: (!) 113/92   Pulse: (!) 134 (!) 133  Resp: (!) 26   Temp: (!) 97.5 F (36.4 C)   SpO2: 95%

## 2021-05-22 NOTE — ED Notes (Addendum)
Carelink is being called.    Family member at bedside, Verna Czech, [place] spoke to patient and wife about what is going on and plan of care.

## 2021-05-23 ENCOUNTER — Telehealth: Payer: Self-pay

## 2021-05-23 NOTE — Telephone Encounter (Signed)
Spoke with the patient who states that he is feeling much better. He is not in A flutter anymore. He states that he does not want to be seen in the Afib clinic. I have scheduled him for a visit with Dr. Johney Frame. He will let us know if symptoms return.

## 2021-05-23 NOTE — Telephone Encounter (Signed)
Thomas Bergeron, MD  [place] Triage Can we see if he would like an appointment with Afib clinic as he usually does not do well when he is in Afib/flutter? He will also need a follow-up appointment with either me or APP if we have one available sometime in the near future (I know this is wishful thinking but I can always figure out a spot to work him in if needed)   Thank you!!   -Nira Conn

## 2021-05-24 ENCOUNTER — Other Ambulatory Visit: Payer: Self-pay | Admitting: Podiatry

## 2021-05-24 MED ORDER — DOXYCYCLINE MONOHYDRATE 100 MG PO CAPS: 100.0000 mg | ORAL_CAPSULE | Freq: Two times a day (BID) | ORAL | 0 refills | Status: AC

## 2021-05-24 NOTE — Telephone Encounter (Signed)
Patient is calling for a refill on the doxycycline. He started on a new antibiotic (ID)on Sunday and within an hour after taking, broke out in hives and heart fluttering, went to urgent care and was immediately sent by ambulance to the hospital.  He is better now,foot is doing better but is still swollen,using betadine and started taking rest of the doxycycline he had left over.

## 2021-05-25 ENCOUNTER — Telehealth: Payer: Self-pay | Admitting: *Deleted

## 2021-05-25 NOTE — Telephone Encounter (Signed)
Patient is calling because he said that prescription called into pharmacy for doxycycline(Monodox) is different from original one sent.they would like clarification. Please advise.

## 2021-05-26 ENCOUNTER — Ambulatory Visit (HOSPITAL_COMMUNITY): Payer: Medicare Other

## 2021-05-26 NOTE — Telephone Encounter (Signed)
Called and spoke the patient giving information per Dr Hansel Starling said that he told them to refill the original prescription instead.

## 2021-05-29 ENCOUNTER — Other Ambulatory Visit: Payer: Self-pay | Admitting: Family Medicine

## 2021-05-30 ENCOUNTER — Other Ambulatory Visit: Payer: Self-pay

## 2021-05-30 ENCOUNTER — Encounter: Payer: Self-pay | Admitting: Podiatry

## 2021-05-30 ENCOUNTER — Ambulatory Visit: Payer: Medicare Other | Admitting: Podiatry

## 2021-05-30 DIAGNOSIS — L97514 Non-pressure chronic ulcer of other part of right foot with necrosis of bone: Secondary | ICD-10-CM

## 2021-05-30 DIAGNOSIS — M86171 Other acute osteomyelitis, right ankle and foot: Secondary | ICD-10-CM | POA: Diagnosis not present

## 2021-05-30 MED ORDER — METOPROLOL SUCCINATE ER 25 MG PO TB24: 12.5000 mg | ORAL_TABLET | Freq: Every day | ORAL | 0 refills | Status: DC

## 2021-05-30 NOTE — Progress Notes (Signed)
°  Subjective:  Patient ID: Thomas [name], male    DOB: March 06, 1951,   MRN: [id number]  Chief Complaint  Patient presents with   Foot Ulcer    wound check right foot    71 y.o. male presents for follow-up of right third toe infection and bone biopsy. Patient has been seen by ID and they requested to have bone biopsy done to help guide antibiotics. He was asked to stay off the antibiotics until bone biopsy for a week. He was off the antibiotics but noticed worsening in the toe and started the anitbioitcs provided by ID cefadroxil. After taking those he had an allergic reaction and stopped them. Has now gone back to taking the doxycycline. Relates he is sore but doing better.  Relates he would still like to avoid amputation if possible.   Denies any other pedal complaints. Denies n/v/f/c.   Past Medical History:  Diagnosis Date   Atrial flutter ([place]) 01/2020   Cancer ([place])    prostate   Heart failure with reduced ejection fraction (HCC)    Hypertension    Hypoglycemia    occ   NICM (nonischemic cardiomyopathy) ([place]) 02/12/2020   Prostate cancer ([place]) 2019   Rheumatoid arthritis ([place])     Objective:  Physical Exam: Vascular: DP/PT pulses 2/4 bilateral. CFT <3 seconds. Normal hair growth on digits. No edema.  Skin. No lacerations or abrasions bilateral feet. Third digit  wound with granular base measuring 0.7 cm x 0.5 cm x 0.2 cm with no probe to bone today. No erythema or edema noted to the remaining third digit.  Musculoskeletal: MMT 5/5 bilateral lower extremities in DF, PF, Inversion and Eversion. Deceased ROM in DF of ankle joint.  Neurological: Sensation intact to light touch.   Assessment:   No diagnosis found.    Plan:  Patient was evaluated and treated and all questions answered. Ulcer right foot with fat layer exposed.  -Debridement as below. -Dressed with betadine, DSD. -Off-loading with surgical shoe.  -Continue doxycycline.  -MRI reviewed: Osteomyelitis noted in  distal aspect of third digit.  -Discussed results and options with patient. Discussed amputation of the digit as option vs antibiotics treatment.. Patient would like to continue with antibiotics and would like to avoid amputation. Will continue with doxycycline for time being.  -Follow-up with ID   -Will follow bone cultures.  -Reviewed previous vascular studies.  -Discussed glucose control and proper protein-rich diet.  -Discussed if any worsening redness, pain, fever or chills to call or may need to report to the emergency room. Patient expressed understanding.   Procedure: Excisional Debridement of Wound, bone biopsy  Rationale: Removal of non-viable soft tissue from the wound to promote healing. Culture of bone to send for culture.  Anesthesia: none Pre-Debridement Wound Measurements: Overlying hyperkeratosis Post-Debridement Wound Measurements: 0.7 cm x 0.5 cm x 0.2 cm  Type of Debridement: Sharp Excisional, Rongeur use to get sample of bone for culture.  Tissue Removed: Non-viable soft tissue. Bone culture.  Depth of Debridement: subcutaneous tissue. Technique: Sharp excisional debridement to bleeding, viable wound base. Rongeur was then used in sterile fashion and sent for culture.  Dressing: Dry, sterile, compression dressing. Disposition: Patient tolerated procedure well. Patient to return in 2 week for follow-up.  No follow-ups on file.   Lorenda Peck, DPM

## 2021-06-03 ENCOUNTER — Ambulatory Visit (HOSPITAL_COMMUNITY)
Admission: RE | Admit: 2021-06-03 | Discharge: 2021-06-03 | Disposition: A | Discharge status: Home / Self Care | Payer: Medicare Other | Source: Ambulatory Visit | Attending: Internal Medicine | Admitting: Internal Medicine

## 2021-06-03 ENCOUNTER — Other Ambulatory Visit: Payer: Self-pay

## 2021-06-03 DIAGNOSIS — M869 Osteomyelitis, unspecified: Secondary | ICD-10-CM | POA: Insufficient documentation

## 2021-06-03 DIAGNOSIS — E1169 Type 2 diabetes mellitus with other specified complication: Secondary | ICD-10-CM | POA: Diagnosis not present

## 2021-06-05 LAB — ANAEROBIC CULTURE W GRAM STAIN
MICRO NUMBER:: 12968897
SPECIMEN QUALITY:: ADEQUATE

## 2021-06-13 ENCOUNTER — Ambulatory Visit: Payer: Medicare Other | Admitting: Podiatry

## 2021-06-14 ENCOUNTER — Ambulatory Visit: Payer: Medicare Other | Admitting: Podiatry

## 2021-06-14 ENCOUNTER — Encounter: Payer: Self-pay | Admitting: Podiatry

## 2021-06-14 ENCOUNTER — Other Ambulatory Visit: Payer: Self-pay

## 2021-06-14 DIAGNOSIS — M86171 Other acute osteomyelitis, right ankle and foot: Secondary | ICD-10-CM | POA: Diagnosis not present

## 2021-06-14 DIAGNOSIS — L97514 Non-pressure chronic ulcer of other part of right foot with necrosis of bone: Secondary | ICD-10-CM

## 2021-06-14 DIAGNOSIS — S72141D Displaced intertrochanteric fracture of right femur, subsequent encounter for closed fracture with routine healing: Secondary | ICD-10-CM | POA: Diagnosis not present

## 2021-06-14 MED ORDER — DOXYCYCLINE HYCLATE 100 MG PO TABS: 100.0000 mg | ORAL_TABLET | Freq: Two times a day (BID) | ORAL | 0 refills | Status: DC

## 2021-06-14 NOTE — Progress Notes (Signed)
°  Subjective:  Patient ID: Thomas [name], male    DOB: 03/05/1951,   MRN: [id number]  No chief complaint on file.   71 y.o. male presents for follow-up of right third toe infection and bone biopsy. Here to review results . Has continued with the doxycycline. Relates he is sore but doing better.  Relates he would still like to avoid amputation if possible.   Denies any other pedal complaints. Denies n/v/f/c.   Past Medical History:  Diagnosis Date   Atrial flutter ([place]) 01/2020   Cancer ([place])    prostate   Heart failure with reduced ejection fraction (HCC)    Hypertension    Hypoglycemia    occ   NICM (nonischemic cardiomyopathy) ([place]) 02/12/2020   Prostate cancer ([place]) 2019   Rheumatoid arthritis ([place])     Objective:  Physical Exam: Vascular: DP/PT pulses 2/4 bilateral. CFT <3 seconds. Normal hair growth on digits. No edema.  Skin. No lacerations or abrasions bilateral feet. Third digit  wound with granular base measuring 0.3 cm x 0.4 cm x 0.2 cm with no probe to bone today. No erythema or edema noted to the remaining third digit.  Musculoskeletal: MMT 5/5 bilateral lower extremities in DF, PF, Inversion and Eversion. Deceased ROM in DF of ankle joint.  Neurological: Sensation intact to light touch.   Assessment:   1. Acute osteomyelitis of toe, right ([place])   2. Foot ulcer, right, with necrosis of bone ([place])       Plan:  Patient was evaluated and treated and all questions answered. Ulcer right foot limited to breakdown of skin, osteomyelitis   -Debridement as below. -Dressed with betadine, DSD. -Off-loading with surgical shoe.  -Continue doxycycline.  -Cultures negative for any growth.  -Discussed results and options with patient. Discussed amputation of the digit as option vs antibiotics treatment.. Patient would still like to continue with antibiotics and would like to avoid amputation. Will continue with doxycycline for time being.  -Follow-up with ID   -Reviewed  previous vascular studies.  -Discussed glucose control and proper protein-rich diet.  -Discussed if any worsening redness, pain, fever or chills to call or may need to report to the emergency room. Patient expressed understanding.    Procedure: Excisional Debridement of Wound Rationale: Removal of non-viable soft tissue from the wound to promote healing.  Anesthesia: none Pre-Debridement Wound Measurements: 0.2 cm x 0.1 cm x 0.1 cm  Post-Debridement Wound Measurements: 0.3 cm x 0.4 cm x 0.2 cm  Type of Debridement: Sharp Excisional Tissue Removed: Non-viable soft tissue Depth of Debridement: subcutaneous tissue. Technique: Sharp excisional debridement to bleeding, viable wound base.  Dressing: Dry, sterile, compression dressing. Disposition: Patient tolerated procedure well. Patient to return in 2 week for follow-up.  No follow-ups on file.   No follow-ups on file.   Lorenda Peck, DPM

## 2021-06-21 ENCOUNTER — Ambulatory Visit: Payer: Medicare Other | Admitting: Internal Medicine

## 2021-06-21 ENCOUNTER — Other Ambulatory Visit: Payer: Self-pay

## 2021-06-21 VITALS — BP 134/82 | HR 92 | Temp 97.6°F | Resp 16 | Ht >= 80 in | Wt 215.0 lb

## 2021-06-21 DIAGNOSIS — M86171 Other acute osteomyelitis, right ankle and foot: Secondary | ICD-10-CM | POA: Diagnosis not present

## 2021-06-21 DIAGNOSIS — R6889 Other general symptoms and signs: Secondary | ICD-10-CM | POA: Diagnosis not present

## 2021-06-21 MED ORDER — AMOXICILLIN 500 MG PO CAPS: 1000.0000 mg | ORAL_CAPSULE | Freq: Three times a day (TID) | ORAL | 2 refills | Status: DC

## 2021-06-21 NOTE — Progress Notes (Signed)
Patient Active Problem List   Diagnosis Date Noted   Fracture of intertrochanteric section of femur, closed, right, with nonunion, subsequent encounter 12/24/2020   Inguinal hernia 10/18/2020   Chronic combined systolic and diastolic CHF, NYHA class 2 ([place])    Osteoporosis 05/05/2020   Hip fracture ([place]) 05/04/2020   Acute osteomyelitis of toe, right (HCC)    Foot infection    NICM (nonischemic cardiomyopathy) ([place]), no significant CAD on cardiac cath   02/12/2020   Right foot pain    Rheumatoid arthritis ([place])    Atrial flutter ([place]) 02/02/2020   Lower extremity edema 12/30/2019   Weight loss, non-intentional 11/24/2019   Chronic right hip pain 06/19/2019   Degenerative tear of acetabular labrum of right hip 05/02/2019   Primary osteoarthritis of right hip 05/02/2019   Skin ulcer ([place]) 01/29/2019   Malignant neoplasm of prostate ([place]) 01/16/2018   Insomnia 10/02/2017   S/P bilateral TKA 09/13/2015   Other bilateral secondary osteoarthritis of knee 07/14/2014   Heme positive stool 05/21/2013   Osteoarthritis, multiple sites 01/13/2011   Hypertension 12/06/2010   CLAUSTROPHOBIA 03/01/2010    Patient's Medications  New Prescriptions   No medications on file  Previous Medications   AMIODARONE (PACERONE) 200 MG TABLET    Take 1 tablet (200 mg total) by mouth daily.   APIXABAN (ELIQUIS) 5 MG TABS TABLET    Take 1 tablet (5 mg total) by mouth 2 (two) times daily.   CEFADROXIL (DURICEF) 500 MG CAPSULE    Take 1 capsule (500 mg total) by mouth 2 (two) times daily.   DIAZEPAM (VALIUM) 5 MG TABLET    5 mg at bedtime as needed for bladder spasms. Use rectally   DIAZEPAM (VALIUM) 5 MG TABLET    Take 1 tablet (5 mg total) by mouth at bedtime as needed.   DOXYCYCLINE (VIBRA-TABS) 100 MG TABLET    Take 1 tablet (100 mg total) by mouth 2 (two) times daily for 7 days.   DULOXETINE (CYMBALTA) 60 MG CAPSULE    TAKE 1 CAPSULE BY MOUTH EVERY DAY - PLS MAKE AN APPOINTMENT FOR AN  OFFICE VISIT   LISINOPRIL (ZESTRIL) 10 MG TABLET    Take 1 tablet (10 mg total) by mouth daily. Please make yearly appt with Dr. Johney Frame for February 2023 for future refills. Thank you 1st attempt   METHOCARBAMOL (ROBAXIN) 500 MG TABLET    Take 1 tablet (500 mg total) by mouth 3 (three) times daily as needed for muscle spasms   METHOCARBAMOL (ROBAXIN) 500 MG TABLET    Take 1 tablet (500 mg total) by mouth 3 (three) times daily for muscle spasms   METOPROLOL SUCCINATE (TOPROL XL) 25 MG 24 HR TABLET    Take 0.5 tablets (12.5 mg total) by mouth daily. Please keep upcoming appt in April 2023 with Dr. Johney Frame before anymore refills. Thank you   NARCAN 4 MG/0.1ML LIQD NASAL SPRAY KIT    Place 1 spray into the nose as needed (as directed for emergency).   OXYCODONE (OXY IR/ROXICODONE) 5 MG IMMEDIATE RELEASE TABLET    Take 1 tablet (5 mg total) by mouth every 4 (four) hours as needed for pain.   OXYCODONE (OXY IR/ROXICODONE) 5 MG IMMEDIATE RELEASE TABLET    Take 1 tablet (5 mg total) by mouth every 4 (four) hours as needed for pain.   OXYCODONE (OXY IR/ROXICODONE) 5 MG IMMEDIATE RELEASE TABLET    Take 1 tablet by mouth  every four hours as needed for pain   OXYCODONE (ROXICODONE) 15 MG IMMEDIATE RELEASE TABLET    Take 1 tablet (15 mg total) by mouth every 4 (four) hours as needed for severe pain.   OXYCODONE (ROXICODONE) 15 MG IMMEDIATE RELEASE TABLET    Take 1 tablet (15 mg total) by mouth every 4 (four) hours as needed for severe pain   OXYCODONE (ROXICODONE) 15 MG IMMEDIATE RELEASE TABLET    Take 1 tablet (15 mg total) by mouth every 4 (four) hours as needed for severe pain   OXYCODONE (ROXICODONE) 30 MG IMMEDIATE RELEASE TABLET    Take 1 tablet by mouth every 4 hours as needed for pain (fill 03/19/21)   OXYCODONE (ROXICODONE) 30 MG IMMEDIATE RELEASE TABLET    Take 1 tablet (30 mg total) by mouth every 4 (four) hours as needed fo pain   OXYCODONE (ROXICODONE) 30 MG IMMEDIATE RELEASE TABLET    Take 1  tablet (30 mg total) by mouth every 4 (four) hours as needed for pain   OXYCODONE-ACETAMINOPHEN (PERCOCET) 10-325 MG TABLET    Take 1 tablet by mouth every 6 (six) hours as needed for pain.   OXYCODONE-ACETAMINOPHEN (PERCOCET) 10-325 MG TABLET    Take 1 tablet by mouth every 4 (four) hours as needed for severe pain.   OXYCODONE-ACETAMINOPHEN (PERCOCET) 10-325 MG TABLET    Take 1 tablet by mouth every 4 (four) hours as needed for severe pain.   OXYCODONE-ACETAMINOPHEN (PERCOCET) 10-325 MG TABLET    Take 1 tablet by mouth every 4 (four) hours as needed for severe pain.   OXYCODONE-ACETAMINOPHEN (PERCOCET) 10-325 MG TABLET    Take 1 tablet by mouth every four hours as needed for severe pain   OXYCODONE-ACETAMINOPHEN (PERCOCET) 10-325 MG TABLET    Take 1 tablet by mouth every 4 (four) hours as needed for severe pain.   PREDNISONE (DELTASONE) 5 MG TABLET    Take 5 mg by mouth daily with breakfast.   TADALAFIL (CIALIS) 20 MG TABLET    Take 1 tablet (20 mg total) by mouth daily as needed.   TESTOSTERONE CYPIONATE (DEPOTESTOSTERONE CYPIONATE) 200 MG/ML INJECTION    Inject 0.5 mLs into the muscle once a week.  Modified Medications   No medications on file  Discontinued Medications   No medications on file    Subjective: 71 YM with  HFrEF, A flutter, HTN, Prediabetes, Bilateral joint replacement and chronic neuropathic ulcerations of both feet, b/l foot osteomylitis SP  right  third toe amputation at the PIP joint,  left second metatarsectomy with CX+ MSSA on 12/3 SP cefazolin x 3 weeks presents for right third proximal phalanx osteomyelitis. Pt followed by Podiatry Dr. Blenda Mounts, who ordered MRI in the setting of right foot ulcer, place on doxycyline on 05/10/21. Pt is SP bedside debridement of right foot wound, per podiatry note wound appears to be improving on antibiotics.  05/18/21: He reports seeing redness and oozing about a week ago from his toe. He reports he no longer has drainage following doxycyline.  Plan was to remain off of antibiotics unless wound worsens in order to obtain bx off of antibiotics.  Interval: He restarted antibiotics due to concern for worsening wound.  On 1/29 pt presented to the Ed after developing "welts" with itching all over one hour after first dose of cefadroxil. He has starting taking doxycyline since that time. Underwent bone Bx with Cx negative on 05/30/21 with podiatry.  Today 06/21/21: Pt reports wound feels better.  Review of  Systems: Review of Systems  All other systems reviewed and are negative.  Past Medical History:  Diagnosis Date   Atrial flutter ([place]) 01/2020   Cancer ([place])    prostate   Heart failure with reduced ejection fraction (HCC)    Hypertension    Hypoglycemia    occ   NICM (nonischemic cardiomyopathy) ([place]) 02/12/2020   Prostate cancer ([place]) 2019   Rheumatoid arthritis ([place])     Social History   Tobacco Use   Smoking status: Never   Smokeless tobacco: Never  Vaping Use   Vaping Use: Never used  Substance Use Topics   Alcohol use: Yes    Alcohol/week: 7.0 standard drinks    Types: 7 Standard drinks or equivalent per week   Drug use: Not Currently    Family History  Problem Relation Age of Onset   Prostate cancer Father    Prostate cancer Brother    Colon cancer Neg Hx    Rectal cancer Neg Hx    Stomach cancer Neg Hx     Allergies  Allergen Reactions   Cefadroxil Hives and Palpitations    EMS to ED from [facility].   Diltiazem Hcl Swelling   Chlorthalidone Other (See Comments)    "Makes me light-headed and I don't like the way it makes me feel"   Voltaren [Diclofenac] Rash    Health Maintenance  Topic Date Due   DTAP VACCINES (1) 01/19/1951   Zoster Vaccines- Shingrix (1 of 2) Never done   COLONOSCOPY (Pts 45-63yr Insurance coverage will need to be confirmed)  Never done   Pneumonia Vaccine 69 Years old (2 - PPSV23 if available, else PCV20) 03/01/2020   INFLUENZA VACCINE  11/22/2020   COVID-19 Vaccine (5 - Booster  for Pfizer series) 12/07/2020   DTaP/Tdap/Td (3 - Td or Tdap) 02/16/2029   TETANUS/TDAP  02/16/2029   Hepatitis C Screening  Completed   HPV VACCINES  Aged Out    Objective:  Vitals:   06/21/21 0842  BP: 134/82  Pulse: 92  Resp: 16  Temp: 97.6 F (36.4 C)  SpO2: 100%  Weight: 215 lb (97.5 kg)  Height: '6\' 8"'  (2.032 m)   Body mass index is 23.62 kg/m.  Physical Exam Constitutional:      General: He is not in acute distress.    Appearance: He is normal weight. He is not toxic-appearing.  HENT:     Head: Normocephalic and atraumatic.     Right Ear: External ear normal.     Left Ear: External ear normal.     Nose: No congestion or rhinorrhea.     Mouth/Throat:     Mouth: Mucous membranes are moist.     Pharynx: Oropharynx is clear.  Eyes:     Extraocular Movements: Extraocular movements intact.     Conjunctiva/sclera: Conjunctivae normal.     Pupils: Pupils are equal, round, and reactive to light.  Cardiovascular:     Rate and Rhythm: Normal rate and regular rhythm.     Heart sounds: No murmur heard.   No friction rub. No gallop.  Pulmonary:     Effort: Pulmonary effort is normal.     Breath sounds: Normal breath sounds.  Abdominal:     General: Abdomen is flat. Bowel sounds are normal.     Palpations: Abdomen is soft.  Musculoskeletal:        General: No swelling. Normal range of motion.     Cervical back: Normal range of motion and neck supple.  Skin:    General: Skin is warm and dry.     Comments: Small closed wound on 3rd digiti  Neurological:     General: No focal deficit present.     Mental Status: He is oriented to person, place, and time.  Psychiatric:        Mood and Affect: Mood normal.    Lab Results Lab Results  Component Value Date   WBC 8.6 05/22/2021   HGB 11.3 (L) 05/22/2021   HCT 37.7 (L) 05/22/2021   MCV 79.2 (L) 05/22/2021   PLT 313 05/22/2021    Lab Results  Component Value Date   CREATININE 0.86 05/22/2021   BUN 22 05/22/2021    NA 138 05/22/2021   K 3.6 05/22/2021   CL 105 05/22/2021   CO2 23 05/22/2021    Lab Results  Component Value Date   ALT 16 05/18/2021   AST 22 05/18/2021   ALKPHOS 89 03/26/2020   BILITOT 0.3 05/18/2021    Lab Results  Component Value Date   CHOL 111 02/04/2020   HDL 32 (L) 02/04/2020   LDLCALC 65 02/04/2020   LDLDIRECT 94 12/06/2010   TRIG 72 02/04/2020   CHOLHDL 3.5 02/04/2020   Lab Results  Component Value Date   LABRPR Non Reactive 11/24/2019   No results found for: HIV1RNAQUANT, HIV1RNAVL, CD4TABS   Assessment/Plan #3rd phalanx osteomyelitis on doxycyline x1 week # Hx of b/l foot osteomyelitis with MSSA SP cefazolin x 6 weeks (EOT 05/08/20) #Cefadroxil allergy - He took one dose cefadroxil on 1/29 and developed rash pruritic rash. Underwent bone Bx on 2/6 with negative Cx. He was taking doxycyline at the time. Will target MSSA with amoxicillin.  Plan: -Referal to vascular -D/C doxycyline -Start amoxicillin 1gm tid x 6 weeks form biopsy. Pt reprots tolerating amxicillin in the past -Labs cbc, cmp, esr and crp  -Follow-up in 3 weeks  Laurice Record, [place] [facility] 06/21/2021, 9:20 AM

## 2021-06-22 ENCOUNTER — Other Ambulatory Visit: Payer: Self-pay | Admitting: Family Medicine

## 2021-06-22 ENCOUNTER — Telehealth: Payer: Self-pay

## 2021-06-22 LAB — COMPLETE METABOLIC PANEL WITH GFR
AG Ratio: 1.4 (calc) (ref 1.0–2.5)
ALT: 12 U/L (ref 9–46)
AST: 17 U/L (ref 10–35)
Albumin: 3.9 g/dL (ref 3.6–5.1)
Alkaline phosphatase (APISO): 117 U/L (ref 35–144)
BUN: 15 mg/dL (ref 7–25)
CO2: 32 mmol/L (ref 20–32)
Calcium: 9.2 mg/dL (ref 8.6–10.3)
Chloride: 102 mmol/L (ref 98–110)
Creat: 0.79 mg/dL (ref 0.70–1.35)
Globulin: 2.7 g/dL (calc) (ref 1.9–3.7)
Glucose, Bld: 90 mg/dL (ref 65–99)
Potassium: 4.4 mmol/L (ref 3.5–5.3)
Sodium: 139 mmol/L (ref 135–146)
Total Bilirubin: 0.4 mg/dL (ref 0.2–1.2)
Total Protein: 6.6 g/dL (ref 6.1–8.1)
eGFR: 97 mL/min/{1.73_m2} (ref >=60)

## 2021-06-22 LAB — CBC WITH DIFFERENTIAL/PLATELET
Absolute Monocytes: 755 cells/uL (ref 200–950)
Basophils Absolute: 10 cells/uL (ref 0–200)
Basophils Relative: 0.2 %
Eosinophils Absolute: 152 cells/uL (ref 15–500)
Eosinophils Relative: 3.1 %
HCT: 37 % — ABNORMAL LOW (ref 38.5–50.0)
Hemoglobin: 11.3 g/dL — ABNORMAL LOW (ref 13.2–17.1)
Lymphs Abs: 1019 cells/uL (ref 850–3900)
MCH: 24.5 pg — ABNORMAL LOW (ref 27.0–33.0)
MCHC: 30.5 g/dL — ABNORMAL LOW (ref 32.0–36.0)
MCV: 80.1 fL (ref 80.0–100.0)
MPV: 10.8 fL (ref 7.5–12.5)
Monocytes Relative: 15.4 %
Neutro Abs: 2965 cells/uL (ref 1500–7800)
Neutrophils Relative %: 60.5 %
Platelets: 265 10*3/uL (ref 140–400)
RBC: 4.62 10*6/uL (ref 4.20–5.80)
RDW: 18 % — ABNORMAL HIGH (ref 11.0–15.0)
Total Lymphocyte: 20.8 %
WBC: 4.9 10*3/uL (ref 3.8–10.8)

## 2021-06-22 LAB — C-REACTIVE PROTEIN: CRP: 19.2 mg/L — ABNORMAL HIGH (ref <8.0)

## 2021-06-22 LAB — SEDIMENTATION RATE: Sed Rate: 14 mm/h (ref 0–20)

## 2021-06-22 NOTE — Telephone Encounter (Signed)
-----   Message from Laurice Record, MD sent at 06/22/2021  2:02 PM EST ----- ?Continue amoxicillin as discussed in clinic. CRP elevated, other labs stable.  ?

## 2021-06-22 NOTE — Telephone Encounter (Signed)
Spoke with patient, relayed that CRP was elevated, other labs were stable, and he should continue with amoxicillin. Discussed relevance of elevated inflammatory markers in the setting of infection.  ? ?Patient is asking for additional clarification on why he was switched from doxycycline to amoxicillin. Will route to provider.  ? ?Beryle Flock, RN ? ?

## 2021-06-27 ENCOUNTER — Other Ambulatory Visit: Payer: Self-pay | Admitting: *Deleted

## 2021-06-27 MED ORDER — LISINOPRIL 10 MG PO TABS: 10.0000 mg | ORAL_TABLET | Freq: Every day | ORAL | 0 refills | Status: DC

## 2021-06-29 ENCOUNTER — Encounter: Payer: Self-pay | Admitting: Podiatry

## 2021-06-29 ENCOUNTER — Other Ambulatory Visit: Payer: Self-pay

## 2021-06-29 ENCOUNTER — Ambulatory Visit: Payer: Medicare Other | Admitting: Podiatry

## 2021-06-29 DIAGNOSIS — L84 Corns and callosities: Secondary | ICD-10-CM

## 2021-06-29 DIAGNOSIS — M86171 Other acute osteomyelitis, right ankle and foot: Secondary | ICD-10-CM

## 2021-06-29 NOTE — Progress Notes (Signed)
?  Subjective:  ?Patient ID: Thomas [name], male    DOB: 01-Sep-1950,   MRN: [id number] ? ?Chief Complaint  ?Patient presents with  ? Wound Check  ?  Right foot wound check , patient states wound is better since the last visit , no pain or swelling  ? ? ?71 y.o. male presents for follow-up of right third toe infection . Relates going well believes wound is doing better. Has been on amoxicillin from ID.  Marland Kitchen  Relates he would still like to avoid amputation.   Denies any other pedal complaints. Denies n/v/f/c.  ? ?Past Medical History:  ?Diagnosis Date  ? Atrial flutter ([place]) 01/2020  ? Cancer [facility])   ? prostate  ? Heart failure with reduced ejection fraction ([place])   ? Hypertension   ? Hypoglycemia   ? occ  ? NICM (nonischemic cardiomyopathy) ([place]) 02/12/2020  ? Prostate cancer ([place]) 2019  ? Rheumatoid arthritis ([place])   ? ? ?Objective:  ?Physical Exam: ?Vascular: DP/PT pulses 2/4 bilateral. CFT <3 seconds. Normal hair growth on digits. No edema.  ?Skin. No lacerations or abrasions bilateral feet. Third digit  wound healed. No erythema or edema noted to the remaining third digit.  ?Musculoskeletal: MMT 5/5 bilateral lower extremities in DF, PF, Inversion and Eversion. Deceased ROM in DF of ankle joint.  ?Neurological: Sensation intact to light touch.  ? ?Assessment:  ? ?1. Acute osteomyelitis of toe, right ([place])   ?2. Pre-ulcerative calluses   ? ? ? ? ? ?Plan:  ?Patient was evaluated and treated and all questions answered. ?Ulcer right foot limited to breakdown of skin, osteomyelitis -ulcer healed.  ?-Hyperkeratotic tissue debrided without incident.  ?-ID has him on amoxicillin x 6 weeks.  ?-Cultures negative for any growth.  ?-Discussed results and options with patient. Discussed amputation of the digit as option vs antibiotics treatment. Will continue with abx treatment.  ?-Follow-up with ID    ?-Discussed glucose control and proper protein-rich diet.  ?-Discussed if any worsening redness, pain, fever or chills to call  or may need to report to the emergency room. Patient expressed understanding.  ?Patient to follow-up in 3 weeks to check on wound.  ? ? ? ?Return in about 3 weeks (around 07/20/2021) for wound check. ? ? ?Return in about 3 weeks (around 07/20/2021) for wound check. ? ? ?Lorenda Peck, DPM  ? ? ?

## 2021-07-04 ENCOUNTER — Telehealth: Payer: Self-pay | Admitting: *Deleted

## 2021-07-04 NOTE — Telephone Encounter (Signed)
Patient was notified but a little upset that he has to reach out to them when he is still being seen here. Explained that the ID physician is the one that gave the prescription, wanted to know why is he still coming here if medications cannot be changed or stopped by our doctors.

## 2021-07-04 NOTE — Telephone Encounter (Signed)
Patient is calling because his can't take the amoxicillin or the doxycycline, causes him to throw it back up after taking. Is there something else he can try?Please advise. ?

## 2021-07-05 NOTE — Telephone Encounter (Signed)
Returned patient's call to explain to patient why Dr. Blenda Mounts would not be able to make changes to his antibiotic orders. No answer from patient. Voicemail left advising patient to call the office if he wanted further explanation as to why this can not be done.   ?

## 2021-07-05 NOTE — Telephone Encounter (Signed)
Patient left a message that he was returning your call at 9:28 this morning.  ?

## 2021-07-07 ENCOUNTER — Telehealth: Payer: Self-pay

## 2021-07-07 NOTE — Telephone Encounter (Signed)
Was able to speak with Oley Balm on the triage line at the center for infectious disease to report the patient's comments and concerns. Oley Balm stated she would speak to the pharmacy team and call the patient. Spoke to patient to inform him that he will receive a call from the infectious diease office soon.  ?

## 2021-07-07 NOTE — Telephone Encounter (Signed)
Spoke to patient and explained why Dr. Blenda Mounts would not be able to make changes to his antibiotic course. Patient verbalized understanding. Patient asked if I could call ID to explain to them that he is unable to keep the amoxicillin antibiotic down. It is causing nausea and vomiting so the patient stopped taking the medication and started taking the doxycycline antibiotic again. Explain to patient that he soul speak with ID before changing antibiotics. Again patient asked if I could speak to ID for him. I will attempt to contact infectious disease to past on the information, than notify patient if I am successful or not.  ?

## 2021-07-07 NOTE — Telephone Encounter (Signed)
Tonika with [facility] called stated that the patient called their office on 07/04/21 stating that he was experiencing a lot of nausea and vomiting with the amoxicillin. Joycelyn Schmid stated patient was advised to call our office and let us know, but patient did not call our office. ? ?Patient is now calling them again requesting a new antibiotic. Patient has stopped the amoxacillin and restarted the doxycycline. ?Please advise ?

## 2021-07-12 DIAGNOSIS — S72141D Displaced intertrochanteric fracture of right femur, subsequent encounter for closed fracture with routine healing: Secondary | ICD-10-CM | POA: Diagnosis not present

## 2021-07-14 ENCOUNTER — Telehealth: Payer: Self-pay

## 2021-07-14 ENCOUNTER — Ambulatory Visit: Payer: Medicare Other | Admitting: Internal Medicine

## 2021-07-14 ENCOUNTER — Other Ambulatory Visit: Payer: Self-pay | Admitting: Student

## 2021-07-14 DIAGNOSIS — S72141D Displaced intertrochanteric fracture of right femur, subsequent encounter for closed fracture with routine healing: Secondary | ICD-10-CM

## 2021-07-14 NOTE — Telephone Encounter (Signed)
Called pt to reschedule appointment and ask about abx medication. Pt stated that he can't keep amoxicillin down so he stopped taking it and started taking doxycyline. I spoke with Dr.Singh and she stated that its ok for him to take the doxycycline but she prefers the amoxicillin and to schedule pt with a f/u appointment. ?Adelfa Koh, CMA ?  ?

## 2021-07-15 ENCOUNTER — Telehealth: Payer: Self-pay | Admitting: Podiatry

## 2021-07-15 MED ORDER — DOXYCYCLINE HYCLATE 100 MG PO TABS: 100.0000 mg | ORAL_TABLET | Freq: Two times a day (BID) | ORAL | 0 refills | Status: DC

## 2021-07-15 NOTE — Telephone Encounter (Signed)
Pt would like to continue to see Dr Posey Pronto for wound care. Pt is scheduled for upcoming appt on Wednesday 3/29 but is out of doxycycline (VIBRA-TABS) 100 MG tablet. Pt would like a refill prior to appt on Wednesday if possible, to prevent infection. ? ?Please advise. ?

## 2021-07-15 NOTE — Addendum Note (Signed)
Addended by: Boneta Lucks on: 07/15/2021 11:51 AM ? ? Modules accepted: Orders ? ?

## 2021-07-18 DIAGNOSIS — M0579 Rheumatoid arthritis with rheumatoid factor of multiple sites without organ or systems involvement: Secondary | ICD-10-CM | POA: Diagnosis not present

## 2021-07-18 DIAGNOSIS — C61 Malignant neoplasm of prostate: Secondary | ICD-10-CM | POA: Diagnosis not present

## 2021-07-18 DIAGNOSIS — L089 Local infection of the skin and subcutaneous tissue, unspecified: Secondary | ICD-10-CM | POA: Diagnosis not present

## 2021-07-18 DIAGNOSIS — M79604 Pain in right leg: Secondary | ICD-10-CM | POA: Diagnosis not present

## 2021-07-20 ENCOUNTER — Other Ambulatory Visit: Payer: Self-pay

## 2021-07-20 ENCOUNTER — Ambulatory Visit (INDEPENDENT_AMBULATORY_CARE_PROVIDER_SITE_OTHER): Payer: Medicare Other | Admitting: Podiatry

## 2021-07-20 ENCOUNTER — Ambulatory Visit: Payer: Medicare Other | Admitting: Podiatry

## 2021-07-20 DIAGNOSIS — L84 Corns and callosities: Secondary | ICD-10-CM

## 2021-07-20 NOTE — Progress Notes (Signed)
?Subjective:  ?Patient ID: Thomas [name], male    DOB: 1950-07-21,  MRN: [id number] ? ?Chief Complaint  ?Patient presents with  ? Wound Check  ?  Left foot wound check   ? ? ?71 y.o. male presents with the above complaint.  Right submetatarsal 3 hyperkeratotic lesion.  Patient states that he is doing good.  He was seeing Dr. Blenda Mounts for a little while.  He has been walking on his foot he has been taking a little bit easy.  He denies any other acute complaints.  He is here for wound check. ? ? ?Review of Systems: Negative except as noted in the HPI. Denies N/V/F/Ch. ? ?Past Medical History:  ?Diagnosis Date  ? Atrial flutter ([place]) 01/2020  ? Cancer [facility])   ? prostate  ? Heart failure with reduced ejection fraction ([place])   ? Hypertension   ? Hypoglycemia   ? occ  ? NICM (nonischemic cardiomyopathy) ([place]) 02/12/2020  ? Prostate cancer ([place]) 2019  ? Rheumatoid arthritis ([place])   ? ? ?Current Outpatient Medications:  ?  amiodarone (PACERONE) 200 MG tablet, Take 1 tablet (200 mg total) by mouth daily., Disp: 90 tablet, Rfl: 3 ?  amoxicillin (AMOXIL) 500 MG capsule, Take 2 capsules (1,000 mg total) by mouth 3 (three) times daily., Disp: 90 capsule, Rfl: 2 ?  apixaban (ELIQUIS) 5 MG TABS tablet, Take 1 tablet (5 mg total) by mouth 2 (two) times daily., Disp: 60 tablet, Rfl: 0 ?  diazepam (VALIUM) 5 MG tablet, 5 mg at bedtime as needed for bladder spasms. Use rectally, Disp: , Rfl:  ?  diazepam (VALIUM) 5 MG tablet, Take 1 tablet (5 mg total) by mouth at bedtime as needed., Disp: 30 tablet, Rfl: 1 ?  doxycycline (VIBRA-TABS) 100 MG tablet, Take 1 tablet (100 mg total) by mouth 2 (two) times daily., Disp: 20 tablet, Rfl: 0 ?  DULoxetine (CYMBALTA) 60 MG capsule, TAKE 1 CAPSULE BY MOUTH EVERY DAY - PLS MAKE AN APPOINTMENT FOR AN OFFICE VISIT, Disp: 30 capsule, Rfl: 0 ?  lisinopril (ZESTRIL) 10 MG tablet, Take 1 tablet (10 mg total) by mouth daily., Disp: 90 tablet, Rfl: 0 ?  methocarbamol (ROBAXIN) 500 MG tablet, Take 1 tablet  (500 mg total) by mouth 3 (three) times daily as needed for muscle spasms, Disp: 30 tablet, Rfl: 0 ?  methocarbamol (ROBAXIN) 500 MG tablet, Take 1 tablet (500 mg total) by mouth 3 (three) times daily for muscle spasms, Disp: 30 tablet, Rfl: 0 ?  metoprolol succinate (TOPROL XL) 25 MG 24 hr tablet, Take 0.5 tablets (12.5 mg total) by mouth daily. Please keep upcoming appt in April 2023 with Dr. Johney Frame before anymore refills. Thank you, Disp: 45 tablet, Rfl: 0 ?  NARCAN 4 MG/0.1ML LIQD nasal spray kit, Place 1 spray into the nose as needed (as directed for emergency)., Disp: 2 each, Rfl: 0 ?  oxyCODONE (OXY IR/ROXICODONE) 5 MG immediate release tablet, Take 1 tablet (5 mg total) by mouth every 4 (four) hours as needed for pain., Disp: 42 tablet, Rfl: 0 ?  oxyCODONE (OXY IR/ROXICODONE) 5 MG immediate release tablet, Take 1 tablet (5 mg total) by mouth every 4 (four) hours as needed for pain., Disp: 42 tablet, Rfl: 0 ?  oxyCODONE (OXY IR/ROXICODONE) 5 MG immediate release tablet, Take 1 tablet by mouth every four hours as needed for pain, Disp: 42 tablet, Rfl: 0 ?  oxyCODONE (ROXICODONE) 15 MG immediate release tablet, Take 1 tablet (15 mg total) by mouth  every 4 (four) hours as needed for severe pain., Disp: 60 tablet, Rfl: 0 ?  oxyCODONE (ROXICODONE) 15 MG immediate release tablet, Take 1 tablet (15 mg total) by mouth every 4 (four) hours as needed for severe pain, Disp: 60 tablet, Rfl: 0 ?  oxyCODONE (ROXICODONE) 15 MG immediate release tablet, Take 1 tablet (15 mg total) by mouth every 4 (four) hours as needed for severe pain, Disp: 60 tablet, Rfl: 0 ?  oxycodone (ROXICODONE) 30 MG immediate release tablet, Take 1 tablet by mouth every 4 hours as needed for pain (fill 03/19/21), Disp: 60 tablet, Rfl: 0 ?  oxycodone (ROXICODONE) 30 MG immediate release tablet, Take 1 tablet (30 mg total) by mouth every 4 (four) hours as needed fo pain, Disp: 42 tablet, Rfl: 0 ?  oxycodone (ROXICODONE) 30 MG immediate release  tablet, Take 1 tablet (30 mg total) by mouth every 4 (four) hours as needed for pain, Disp: 42 tablet, Rfl: 0 ?  oxyCODONE-acetaminophen (PERCOCET) 10-325 MG tablet, Take 1 tablet by mouth every 6 (six) hours as needed for pain., Disp: 42 tablet, Rfl: 0 ?  oxyCODONE-acetaminophen (PERCOCET) 10-325 MG tablet, Take 1 tablet by mouth every 4 (four) hours as needed for severe pain., Disp: 42 tablet, Rfl: 0 ?  oxyCODONE-acetaminophen (PERCOCET) 10-325 MG tablet, Take 1 tablet by mouth every 4 (four) hours as needed for severe pain., Disp: 42 tablet, Rfl: 0 ?  oxyCODONE-acetaminophen (PERCOCET) 10-325 MG tablet, Take 1 tablet by mouth every 4 (four) hours as needed for severe pain., Disp: 42 tablet, Rfl: 0 ?  oxyCODONE-acetaminophen (PERCOCET) 10-325 MG tablet, Take 1 tablet by mouth every four hours as needed for severe pain, Disp: 42 tablet, Rfl: 0 ?  oxyCODONE-acetaminophen (PERCOCET) 10-325 MG tablet, Take 1 tablet by mouth every 4 (four) hours as needed for severe pain., Disp: 42 tablet, Rfl: 0 ?  predniSONE (DELTASONE) 5 MG tablet, Take 5 mg by mouth daily with breakfast., Disp: , Rfl:  ?  tadalafil (CIALIS) 20 MG tablet, Take 1 tablet (20 mg total) by mouth daily as needed., Disp: 30 tablet, Rfl: 5 ?  testosterone cypionate (DEPOTESTOSTERONE CYPIONATE) 200 MG/ML injection, Inject 0.5 mLs into the muscle once a week., Disp: , Rfl:  ? ?Social History  ? ?Tobacco Use  ?Smoking Status Never  ?Smokeless Tobacco Never  ? ? ?Allergies  ?Allergen Reactions  ? Cefadroxil Hives and Palpitations  ?  EMS to ED from [place].  ? Diltiazem Hcl Swelling  ? Chlorthalidone Other (See Comments)  ?  "Makes me light-headed and I don't like the way it makes me feel"  ? Voltaren [Diclofenac] Rash  ? ?Objective:  ?There were no vitals filed for this visit. ?There is no height or weight on file to calculate BMI. ?Constitutional Well developed. ?Well nourished.  ?Vascular Dorsalis pedis pulses palpable bilaterally. ?Posterior tibial pulses  palpable bilaterally. ?Capillary refill normal to all digits.  ?No cyanosis or clubbing noted. ?Pedal hair growth normal.  ?Neurologic Normal speech. ?Oriented to person, place, and time. ?Epicritic sensation to light touch grossly present bilaterally.  ?Dermatologic Hyperkeratotic lesion noted to the right submetatarsal 3.  No signs of wounds noted.  Wound has completely reepithelialized.  No clinical signs of infection noted.  ?Orthopedic: Normal joint ROM without pain or crepitus bilaterally. ?No visible deformities. ?No bony tenderness.  ? ?Radiographs: None ?Assessment:  ? ?1. Pre-ulcerative calluses   ? ?Plan:  ?Patient was evaluated and treated and all questions answered. ? ?Right submetatarsal 3 hyperkeratotic lesion/preulcerative  callus ?-All questions and concerns were discussed with the patient in extensive detail.  Upon debridement no further wound noted.  The wound has completely reepithelialized.  I discussed shoe gear modification extensive detail he states understanding. ?-If any foot and ankle issues on future of asked her to come see me. ? ?No follow-ups on file. ?

## 2021-07-28 ENCOUNTER — Telehealth: Payer: Self-pay | Admitting: *Deleted

## 2021-07-28 NOTE — Chronic Care Management (AMB) (Signed)
?  Care Management  ? ?Note ? ?07/28/2021 ?Name: CLARKE PERETZ MRN: [id number] DOB: [birth year] ? ?CLAUD GOWAN is a 70 y.o. year old male who is a primary care patient of Chambliss, Jeb Levering, MD. I reached out to [name] by phone today offer care coordination services.  ? ?Mr. Taber was given information about care management services today including:  ?Care management services include personalized support from designated clinical staff supervised by his physician, including individualized plan of care and coordination with other care providers ?24/7 contact phone numbers for assistance for urgent and routine care needs. ?The patient may stop care management services at any time by phone call to the office staff. ? ?Patient agreed to services and verbal consent obtained.  ? ?Follow up plan: ?Telephone appointment with care management team member scheduled for:08/03/21 ? ?Laverda Sorenson  ?Care Guide, Embedded Care Coordination ?[place]  Care Management  ?Direct Dial: [phone] ? ?

## 2021-07-31 NOTE — Progress Notes (Deleted)
?Cardiology Office Note:   ? ?Date:  07/31/2021  ? ?ID:  Thomas [name], DOB [birth year], MRN [id number] ? ?PCP:  [name], MD  ?[facility] Cardiologist:  Freada Bergeron, MD  ?[facility] Electrophysiologist:  None  ? ?Referring MD: [name], *  ? ?History of Present Illness:   ? ?Thomas [name] is a 71 y.o. male with a hx of of chronic systolic CHF/non-ischemic cardiomyopathy with EF of <20%, atrial flutter s/p DCCV in 01/2020 on Amiodarone and Eliquis, hypertension, history of osteomyelitis s/p multiple toe amputations, rheumatoid arthritis, prostate cancer, and anxiety/depression who presents to clinic for follow-up. ? ?He was admitted from 02/02/2020-02/12/2020 for atrial flutter with RVR and newly diagnosed acute systolic CHF with gross volume overload. Echo showed LVEF of <20% with global hypokinesis, biatrial enlargement, mild MR, and moderate bilateral pleural effusions. RV mildly enlarged but systolic function normal. Patient was diuresed with IV Lasix and then underwent right/left cardiac catheterization on 02/09/2020 which showed minimal CAD, mildly reduced Fick cardiac output/index, and upper normal left/heart pressures. He then underwent successful TEE/DCCV on 02/11/2020. Following DCCV, he became somnolent and hypotensive requiring pressors but rapidly improved and these were able to be weaned off quickly. He was discharged on Torsemide '20mg'$  twice daily, Amiodarone '200mg'$  twice daily, and Eliquis '5mg'$  twice daily. Unfortunately BP limited our ability to add guideline directed medical therapy for CHF. ?  ?Patient was seen by Marcellina Millin, NP, for follow-up on 03/31/2020. He reported having left foot surgery the week prior to this visit and had a PICC line placed for antibiotics. He was doing well from a cardiac standpoint. Amiodarone was decreased to '200mg'$  once daily and Torsemide was decreased to '20mg'$  once daily. Lisinopril '10mg'$  once daily was added. ?  ?Patient  presented to the ED on 05/05/2019 after a fall and was found to have a right intertrochanteric hip fractures. Cardiology consulted for pre-op evaluation. He underwent the procedure without complications. TTE on that admission with improved LVEF 45-50%. ? ?Last seen in clinic on 05/25/20 where he was doing well from a CV standpoint. Was having issues with orthopedic pain.  ? ?Today,  ? ?Past Medical History:  ?Diagnosis Date  ? Atrial flutter ([place]) 01/2020  ? Cancer [facility])   ? prostate  ? Heart failure with reduced ejection fraction ([place])   ? Hypertension   ? Hypoglycemia   ? occ  ? NICM (nonischemic cardiomyopathy) ([place]) 02/12/2020  ? Prostate cancer ([place]) 2019  ? Rheumatoid arthritis ([place])   ? ? ?Past Surgical History:  ?Procedure Laterality Date  ? AMPUTATION TOE Right 12/2019  ? AMPUTATION TOE Right 03/26/2020  ? Procedure: Right 3rd toe partial amputation, bone biopsy right 1st metatarsal;  Surgeon: Evelina Bucy, DPM;  Location: [facility];  Service: Podiatry;  Laterality: Right;  ? BUBBLE STUDY  02/11/2020  ? Procedure: BUBBLE STUDY;  Surgeon: Geralynn Rile, MD;  Location: [place];  Service: Cardiovascular;;  ? CARDIOVERSION N/A 02/11/2020  ? Procedure: CARDIOVERSION;  Surgeon: Geralynn Rile, MD;  Location: [place];  Service: Cardiovascular;  Laterality: N/A;  ? CYSTOSCOPY  04/11/2018  ? Procedure: CYSTOSCOPY FLEXIBLE;  Surgeon: [name], MD;  Location: [facility];  Service: Urology;;  NO SEEDS FOUND IN BLADDER  ? HERNIA REPAIR  2009  ? inguinal  ? INTRAMEDULLARY (IM) NAIL INTERTROCHANTERIC Right 05/05/2020  ? Procedure: CPT 27245-Cephalomedullary nailing of right intertrochanteric femur fracture;  Surgeon: Shona Needles, MD;  Location: [facility]  OR;  Service: Orthopedics;  Laterality: Right;  ? INTRAMEDULLARY (IM) NAIL INTERTROCHANTERIC Right 12/24/2020  ? Procedure: REVISION FIXATION OF RIGHT INTERTROCHANTERIC FEMUR NONUNION;  Surgeon: Shona Needles, MD;   Location: [place];  Service: Orthopedics;  Laterality: Right;  ? IRRIGATION AND DEBRIDEMENT FOOT Left 03/26/2020  ? Procedure: Left foot incision and drainage with removal of all non-viable soft tissue and bone - areas overlying the 2nd and 5th metatarsals.;  Surgeon: Evelina Bucy, DPM;  Location: [facility];  Service: Podiatry;  Laterality: Left;  ? RADIOACTIVE SEED IMPLANT N/A 04/11/2018  ? Procedure: RADIOACTIVE SEED IMPLANT/BRACHYTHERAPY IMPLANT;  Surgeon: [name], MD;  Location: [facility];  Service: Urology;  Laterality: N/A;   71     SEEDS IMPLANTED  ? RIGHT/LEFT HEART CATH AND CORONARY ANGIOGRAPHY N/A 02/09/2020  ? Procedure: RIGHT/LEFT HEART CATH AND CORONARY ANGIOGRAPHY;  Surgeon: Nelva Bush, MD;  Location: [facility];  Service: Cardiovascular;  Laterality: N/A;  ? SPACE OAR INSTILLATION N/A 04/11/2018  ? Procedure: SPACE OAR INSTILLATION;  Surgeon: [name], MD;  Location: [facility];  Service: Urology;  Laterality: N/A;  ? TEE WITHOUT CARDIOVERSION N/A 02/11/2020  ? Procedure: TRANSESOPHAGEAL ECHOCARDIOGRAM (TEE);  Surgeon: Geralynn Rile, MD;  Location: [place];  Service: Cardiovascular;  Laterality: N/A;  ? TOTAL KNEE ARTHROPLASTY Bilateral 09/13/2015  ? Procedure: BILATERAL KNEE ARTHROPLASTY ;  Surgeon: Paralee Cancel, MD;  Location: [facility];  Service: Orthopedics;  Laterality: Bilateral;  ? ? ?Current Medications: ?No outpatient medications have been marked as taking for the 08/02/21 encounter (Appointment) with Freada Bergeron, MD.  ?  ? ?Allergies:   Cefadroxil, Diltiazem hcl, Chlorthalidone, and Voltaren [diclofenac]  ? ?Social History  ? ?Socioeconomic History  ? Marital status: Married  ?  Spouse name: Not on file  ? Number of children: Not on file  ? Years of education: Not on file  ? Highest education level: Not on file  ?Occupational History  ? Not on file  ?Tobacco Use  ? Smoking status: Never  ? Smokeless  tobacco: Never  ?Vaping Use  ? Vaping Use: Never used  ?Substance and Sexual Activity  ? Alcohol use: Yes  ?  Alcohol/week: 7.0 standard drinks  ?  Types: 7 Standard drinks or equivalent per week  ? Drug use: Not Currently  ? Sexual activity: Yes  ?Other Topics Concern  ? Not on file  ?Social History Narrative  ? Not on file  ? ?Social Determinants of Health  ? ?Financial Resource Strain: Not on file  ?Food Insecurity: Not on file  ?Transportation Needs: Not on file  ?Physical Activity: Not on file  ?Stress: Not on file  ?Social Connections: Not on file  ?  ? ?Family History: ?The patient's family history includes Prostate cancer in his brother and father. There is no history of Colon cancer, Rectal cancer, or Stomach cancer. ? ?ROS:   ?Please see the history of present illness.    ?Review of Systems  ?Constitutional:  Negative for chills and fever.  ?HENT:  Negative for nosebleeds.   ?Eyes:  Negative for blurred vision.  ?Respiratory:  Negative for shortness of breath.   ?Cardiovascular:  Positive for leg swelling. Negative for chest pain, palpitations, orthopnea, claudication and PND.  ?Gastrointestinal:  Negative for blood in stool and melena.  ?Genitourinary:  Negative for hematuria.  ?Musculoskeletal:  Positive for falls, joint pain and myalgias.  ?Neurological:  Negative for dizziness and loss of consciousness.  ?  Endo/Heme/Allergies:  Negative for polydipsia.  ?Psychiatric/Behavioral:  Negative for memory loss.   ? ?EKGs/Labs/Other Studies Reviewed:   ? ?The following studies were reviewed today: ?Echocardiogram 05/06/20: ?IMPRESSIONS  ? 1. Left ventricular ejection fraction, by estimation, is 45 to 50%. The  ?left ventricle has mildly decreased function. The left ventricle  ?demonstrates global hypokinesis. Left ventricular diastolic parameters are  ?consistent with Grade I diastolic  ?dysfunction (impaired relaxation).  ? 2. Right ventricular systolic function is normal. The right ventricular  ?size is  normal. There is normal pulmonary artery systolic pressure. The  ?estimated right ventricular systolic pressure is 61.2 mmHg.  ? 3. The mitral valve is normal in structure. No evidence of mitral valve  ?regurgitation.  ? 4. Terie Purser

## 2021-08-02 ENCOUNTER — Encounter: Payer: Self-pay | Admitting: Cardiology

## 2021-08-02 ENCOUNTER — Ambulatory Visit: Payer: Medicare Other | Admitting: Cardiology

## 2021-08-02 ENCOUNTER — Encounter: Payer: Self-pay | Admitting: Student

## 2021-08-02 ENCOUNTER — Ambulatory Visit: Payer: Medicare Other | Admitting: Student

## 2021-08-02 VITALS — BP 134/80 | HR 89 | Ht >= 80 in | Wt 220.0 lb

## 2021-08-02 DIAGNOSIS — I5042 Chronic combined systolic (congestive) and diastolic (congestive) heart failure: Secondary | ICD-10-CM | POA: Diagnosis not present

## 2021-08-02 DIAGNOSIS — M069 Rheumatoid arthritis, unspecified: Secondary | ICD-10-CM

## 2021-08-02 DIAGNOSIS — I484 Atypical atrial flutter: Secondary | ICD-10-CM

## 2021-08-02 DIAGNOSIS — I4892 Unspecified atrial flutter: Secondary | ICD-10-CM

## 2021-08-02 DIAGNOSIS — D6869 Other thrombophilia: Secondary | ICD-10-CM

## 2021-08-02 DIAGNOSIS — I428 Other cardiomyopathies: Secondary | ICD-10-CM | POA: Diagnosis not present

## 2021-08-02 LAB — BASIC METABOLIC PANEL
BUN/Creatinine Ratio: 22 (ref 10–24)
BUN: 17 mg/dL (ref 8–27)
CO2: 26 mmol/L (ref 20–29)
Calcium: 9.5 mg/dL (ref 8.6–10.2)
Chloride: 99 mmol/L (ref 96–106)
Creatinine, Ser: 0.79 mg/dL (ref 0.76–1.27)
Glucose: 93 mg/dL (ref 70–99)
Potassium: 4.3 mmol/L (ref 3.5–5.2)
Sodium: 137 mmol/L (ref 134–144)
eGFR: 97 mL/min/{1.73_m2} (ref >59)

## 2021-08-02 LAB — CBC
Hematocrit: 35.9 % — ABNORMAL LOW (ref 37.5–51.0)
Hemoglobin: 11.2 g/dL — ABNORMAL LOW (ref 13.0–17.7)
MCH: 24.5 pg — ABNORMAL LOW (ref 26.6–33.0)
MCHC: 31.2 g/dL — ABNORMAL LOW (ref 31.5–35.7)
MCV: 78 fL — ABNORMAL LOW (ref 79–97)
Platelets: 343 10*3/uL (ref 150–450)
RBC: 4.58 x10E6/uL (ref 4.14–5.80)
RDW: 15.7 % — ABNORMAL HIGH (ref 11.6–15.4)
WBC: 6.6 10*3/uL (ref 3.4–10.8)

## 2021-08-02 MED ORDER — METOPROLOL SUCCINATE ER 25 MG PO TB24: 25.0000 mg | ORAL_TABLET | Freq: Every day | ORAL | 3 refills | Status: DC

## 2021-08-02 NOTE — Patient Instructions (Addendum)
Medication Instructions:  ?Increase Metoprolol to 25 mg daily  ? ? ?*If you need a refill on your cardiac medications before your next appointment, please call your pharmacy* ? ? ?Lab Work: ?Bmp, Cbc- today  ? ?If you have labs (blood work) drawn today and your tests are completely normal, you will receive your results only by: ?MyChart Message (if you have MyChart) OR ?A paper copy in the mail ?If you have any lab test that is abnormal or we need to change your treatment, we will call you to review the results. ? ? ?Testing/Procedures: ?Your physician has recommended that you have a Cardioversion (DCCV). Electrical Cardioversion uses a jolt of electricity to your heart either through paddles or wired patches attached to your chest. This is a controlled, usually prescheduled, procedure. Defibrillation is done under light anesthesia in the hospital, and you usually go home the day of the procedure. This is done to get your heart back into a normal rhythm. You are not awake for the procedure. Please see the instruction sheet given to you today. ? ? ?Your physician has requested that you have an echocardiogram. Echocardiography is a painless test that uses sound waves to create images of your heart. It provides your doctor with information about the size and shape of your heart and how well your heart?s chambers and valves are working. This procedure takes approximately one hour. There are no restrictions for this procedure. ? ? ? ? ?Follow-Up: ?At [facility], you and your health needs are our priority.  As part of our continuing mission to provide you with exceptional heart care, we have created designated Provider Care Teams.  These Care Teams include your primary Cardiologist (physician) and Advanced Practice Providers (APPs -  Physician Assistants and Nurse Practitioners) who all work together to provide you with the care you need, when you need it. ? ?We recommend signing up for the patient portal called  "MyChart".  Sign up information is provided on this After Visit Summary.  MyChart is used to connect with patients for Virtual Visits (Telemedicine).  Patients are able to view lab/test results, encounter notes, upcoming appointments, etc.  Non-urgent messages can be sent to your provider as well.   ?To learn more about what you can do with MyChart, go to [email or web address].   ? ?Your next appointment:   ?6 month(s) ? ?The format for your next appointment:   ?In Person ? ?Provider:   ?Freada Bergeron, MD   ? ? ?Other Instructions ?You are scheduled for a Cardioversion on Tuesday August 16, 2021 with Dr.Mary Branch.  Please arrive at the [facility] (Main Entrance A) at [facility]: [address] [place], [place] [address] at 7 am ? ?(DIET: Nothing to eat or drink after midnight except a sip of water with medications (see medication instructions below) ? ?FYI: For your safety, and to allow [place] to monitor your vital signs accurately during the surgery/procedure we request that   ?if you have artificial nails, gel coating, SNS etc. Please have those removed prior to your surgery/procedure. Not having the nail coverings /polish removed may result in cancellation or delay of your surgery/procedure. ? ? ?Medication Instructions: ?Continue your anticoagulant: Eliquis  ?You will need to continue your anticoagulant after your procedure until you are told by your provider that it is safe to stop ? ? ? ?You must have a responsible person to drive you home and stay in the waiting area during your procedure. Failure to do  so could result in cancellation. ? ?Interior and spatial designer cards. ? ?*Special Note: Every effort is made to have your procedure done on time. Occasionally there are emergencies that occur at the hospital that may cause delays. Please be patient if a delay does occur.  ? ? ?Important Information About Sugar ? ? ? ? ?  ?

## 2021-08-02 NOTE — Progress Notes (Signed)
?Cardiology Office Note:   ? ?Date:  08/02/2021  ? ?ID:  Thomas [name], DOB 02/01/1951, MRN [id number] ? ?PCP:  [name], MD  ?[facility] Cardiologist:  Freada Bergeron, MD  ?[facility] Electrophysiologist:  None  ? ?Referring MD: [name], *  ? ?History of Present Illness:   ? ?Thomas [name] is a 71 y.o. male with a hx of of chronic systolic CHF/non-ischemic cardiomyopathy with EF of <20%, atrial flutter s/p DCCV in 01/2020 on Amiodarone and Eliquis, hypertension, history of osteomyelitis s/p multiple toe amputations, rheumatoid arthritis, prostate cancer, and anxiety/depression who presents to clinic for follow-up. ? ?He was admitted from 02/02/2020-02/12/2020 for atrial flutter with RVR and newly diagnosed acute systolic CHF with gross volume overload. Echo showed LVEF of <20% with global hypokinesis, biatrial enlargement, mild MR, and moderate bilateral pleural effusions. RV mildly enlarged but systolic function normal. Patient was diuresed with IV Lasix and then underwent right/left cardiac catheterization on 02/09/2020 which showed minimal CAD, mildly reduced Fick cardiac output/index, and upper normal left/heart pressures. He then underwent successful TEE/DCCV on 02/11/2020. Following DCCV, he became somnolent and hypotensive requiring pressors but rapidly improved and these were able to be weaned off quickly. He was discharged on Torsemide 39m twice daily, Amiodarone 2051mtwice daily, and Eliquis 107m51mwice daily. Unfortunately BP limited our ability to add guideline directed medical therapy for CHF. ?  ?Patient was seen by LorMarcellina MillinP, for follow-up on 03/31/2020. He reported having left foot surgery the week prior to this visit and had a PICC line placed for antibiotics. He was doing well from a cardiac standpoint. Amiodarone was decreased to 200m40mce daily and Torsemide was decreased to 20mg54me daily. Lisinopril 10mg 36m daily was added. ?  ?Patient  presented to the ED on 05/05/2019 after a fall and was found to have a right intertrochanteric hip fractures. Cardiology consulted for pre-op evaluation. He underwent the procedure without complications. TTE on that admission with improved LVEF 45-50%. ? ?Last seen in clinic on 05/25/20 where he was doing well from a CV standpoint. Was having issues with orthopedic pain.  ? ?Today, the patient states he is doing fine. Although he is in atrial flutter in clinic today, he denies feeling any associated symptoms. No palpitations, lightheadedness, dizziness, LE edema, orthopnea or chest pain. Has been compliant with amiodarone and apixaban without issues. Notably, was hospitalized in 04/2021 with a foot infection and that is where the ECG shows recurrent flutter. Prior ECG 04/2020, he was in NSR. Also had a fall with a hip fracture last year so unclear if he went out of rhythm at that time ? ?Otherwise, blood pressure is well controlled. He is hoping to return to work next week.  ? ?He denies any palpitations, chest pain, shortness of breath, or peripheral edema. No lightheadedness, headaches, syncope, orthopnea, or PND.  ? ?Past Medical History:  ?Diagnosis Date  ? Atrial flutter (HCC) [id number]  ? [facility]  ? Heart failure with reduced ejection fraction (HCC)  [place]   ? Hypogly[place]   ? occ  ? NICM (nonischemic cardiomyopathy) (HCC) 1[place]/2021  ? Prostate cancer (HCC) [employer]  ? Rheumatoid arthritis (HCC)  [place]? ?Past Surgical History:  ?Procedure Laterality Date  ? AMPUTATION TOE Right 12/2019  ? AMPUTATION TOE Right 03/26/2020  ? Procedure: Right 3rd toe partial amputation, bone biopsy right 1st metatarsal;  Surgeon: Price,Evelina Bucy  [facility];  Service: Podiatry;  Laterality: Right;  ? BUBBLE STUDY  02/11/2020  ? Procedure: BUBBLE STUDY;  Surgeon: Geralynn Rile, MD;  Location: [place];  Service: Cardiovascular;;  ? CARDIOVERSION N/A 02/11/2020  ? Procedure:  CARDIOVERSION;  Surgeon: Geralynn Rile, MD;  Location: [place];  Service: Cardiovascular;  Laterality: N/A;  ? CYSTOSCOPY  04/11/2018  ? Procedure: CYSTOSCOPY FLEXIBLE;  Surgeon: [name], MD;  Location: [facility];  Service: Urology;;  NO SEEDS FOUND IN BLADDER  ? HERNIA REPAIR  2009  ? inguinal  ? INTRAMEDULLARY (IM) NAIL INTERTROCHANTERIC Right 05/05/2020  ? Procedure: CPT 27245-Cephalomedullary nailing of right intertrochanteric femur fracture;  Surgeon: Shona Needles, MD;  Location: [facility];  Service: Orthopedics;  Laterality: Right;  ? INTRAMEDULLARY (IM) NAIL INTERTROCHANTERIC Right 12/24/2020  ? Procedure: REVISION FIXATION OF RIGHT INTERTROCHANTERIC FEMUR NONUNION;  Surgeon: Shona Needles, MD;  Location: [place];  Service: Orthopedics;  Laterality: Right;  ? IRRIGATION AND DEBRIDEMENT FOOT Left 03/26/2020  ? Procedure: Left foot incision and drainage with removal of all non-viable soft tissue and bone - areas overlying the 2nd and 5th metatarsals.;  Surgeon: Evelina Bucy, DPM;  Location: [facility];  Service: Podiatry;  Laterality: Left;  ? RADIOACTIVE SEED IMPLANT N/A 04/11/2018  ? Procedure: RADIOACTIVE SEED IMPLANT/BRACHYTHERAPY IMPLANT;  Surgeon: [name], MD;  Location: [facility];  Service: Urology;  Laterality: N/A;   71     SEEDS IMPLANTED  ? RIGHT/LEFT HEART CATH AND CORONARY ANGIOGRAPHY N/A 02/09/2020  ? Procedure: RIGHT/LEFT HEART CATH AND CORONARY ANGIOGRAPHY;  Surgeon: Nelva Bush, MD;  Location: [place] [facility];  Service: Cardiovascular;  Laterality: N/A;  ? SPACE OAR INSTILLATION N/A 04/11/2018  ? Procedure: SPACE OAR INSTILLATION;  Surgeon: [name], MD;  Location: [facility];  Service: Urology;  Laterality: N/A;  ? TEE WITHOUT CARDIOVERSION N/A 02/11/2020  ? Procedure: TRANSESOPHAGEAL ECHOCARDIOGRAM (TEE);  Surgeon: Geralynn Rile, MD;  Location: [place];  Service: Cardiovascular;   Laterality: N/A;  ? TOTAL KNEE ARTHROPLASTY Bilateral 09/13/2015  ? Procedure: BILATERAL KNEE ARTHROPLASTY ;  Surgeon: Paralee Cancel, MD;  [facility]: [facility];  Service: Orthopedics;  Laterality: Bilateral;  ? ? ?Current Medications: ?Current Meds  ?Medication Sig  ? amiodarone (PACERONE) 200 MG tablet Take 1 tablet (200 mg total) by mouth daily.  ? apixaban (ELIQUIS) 5 MG TABS tablet Take 1 tablet (5 mg total) by mouth 2 (two) times daily.  ? diazepam (VALIUM) 5 MG tablet 5 mg at bedtime as needed for bladder spasms. Use rectally  ? diazepam (VALIUM) 5 MG tablet Take 1 tablet (5 mg total) by mouth at bedtime as needed.  ? doxycycline (VIBRA-TABS) 100 MG tablet Take 1 tablet (100 mg total) by mouth 2 (two) times daily.  ? DULoxetine (CYMBALTA) 60 MG capsule TAKE 1 CAPSULE BY MOUTH EVERY DAY - PLS MAKE AN APPOINTMENT FOR AN OFFICE VISIT  ? lisinopril (ZESTRIL) 10 MG tablet Take 1 tablet (10 mg total) by mouth daily.  ? metoprolol succinate (TOPROL XL) 25 MG 24 hr tablet Take 1 tablet (25 mg total) by mouth daily.  ? NARCAN 4 MG/0.1ML LIQD nasal spray kit Place 1 spray into the nose as needed (as directed for emergency).  ? oxyCODONE (OXY IR/ROXICODONE) 5 MG immediate release tablet Take 1 tablet (5 mg total) by mouth every 4 (four) hours as needed for pain.  ? oxyCODONE (OXY IR/ROXICODONE) 5 MG immediate release tablet  Take 1 tablet (5 mg total) by mouth every 4 (four) hours as needed for pain.  ? oxyCODONE (OXY IR/ROXICODONE) 5 MG immediate release tablet Take 1 tablet by mouth every four hours as needed for pain  ? oxyCODONE (ROXICODONE) 15 MG immediate release tablet Take 1 tablet (15 mg total) by mouth every 4 (four) hours as needed for severe pain.  ? oxyCODONE (ROXICODONE) 15 MG immediate release tablet Take 1 tablet (15 mg total) by mouth every 4 (four) hours as needed for severe pain  ? oxyCODONE (ROXICODONE) 15 MG immediate release tablet Take 1 tablet (15 mg total) by mouth every 4 (four) hours as needed for  severe pain  ? oxycodone (ROXICODONE) 30 MG immediate release tablet Take 1 tablet by mouth every 4 hours as needed for pain (fill 03/19/21)  ? oxycodone (ROXICODONE) 30 MG immediate release tablet Take 1 tablet (30 mg

## 2021-08-02 NOTE — H&P (View-Only) (Signed)
?Cardiology Office Note:   ? ?Date:  08/02/2021  ? ?ID:  Thomas [name], DOB 02/01/1951, MRN [id number] ? ?PCP:  [name], MD  ?[facility] Cardiologist:  Freada Bergeron, MD  ?[facility] Electrophysiologist:  None  ? ?Referring MD: [name], *  ? ?History of Present Illness:   ? ?Thomas [name] is a 71 y.o. male with a hx of of chronic systolic CHF/non-ischemic cardiomyopathy with EF of <20%, atrial flutter s/p DCCV in 01/2020 on Amiodarone and Eliquis, hypertension, history of osteomyelitis s/p multiple toe amputations, rheumatoid arthritis, prostate cancer, and anxiety/depression who presents to clinic for follow-up. ? ?He was admitted from 02/02/2020-02/12/2020 for atrial flutter with RVR and newly diagnosed acute systolic CHF with gross volume overload. Echo showed LVEF of <20% with global hypokinesis, biatrial enlargement, mild MR, and moderate bilateral pleural effusions. RV mildly enlarged but systolic function normal. Patient was diuresed with IV Lasix and then underwent right/left cardiac catheterization on 02/09/2020 which showed minimal CAD, mildly reduced Fick cardiac output/index, and upper normal left/heart pressures. He then underwent successful TEE/DCCV on 02/11/2020. Following DCCV, he became somnolent and hypotensive requiring pressors but rapidly improved and these were able to be weaned off quickly. He was discharged on Torsemide 39m twice daily, Amiodarone 2051mtwice daily, and Eliquis 107m51mwice daily. Unfortunately BP limited our ability to add guideline directed medical therapy for CHF. ?  ?Patient was seen by LorMarcellina MillinP, for follow-up on 03/31/2020. He reported having left foot surgery the week prior to this visit and had a PICC line placed for antibiotics. He was doing well from a cardiac standpoint. Amiodarone was decreased to 200m40mce daily and Torsemide was decreased to 20mg54me daily. Lisinopril 10mg 36m daily was added. ?  ?Patient  presented to the ED on 05/05/2019 after a fall and was found to have a right intertrochanteric hip fractures. Cardiology consulted for pre-op evaluation. He underwent the procedure without complications. TTE on that admission with improved LVEF 45-50%. ? ?Last seen in clinic on 05/25/20 where he was doing well from a CV standpoint. Was having issues with orthopedic pain.  ? ?Today, the patient states he is doing fine. Although he is in atrial flutter in clinic today, he denies feeling any associated symptoms. No palpitations, lightheadedness, dizziness, LE edema, orthopnea or chest pain. Has been compliant with amiodarone and apixaban without issues. Notably, was hospitalized in 04/2021 with a foot infection and that is where the ECG shows recurrent flutter. Prior ECG 04/2020, he was in NSR. Also had a fall with a hip fracture last year so unclear if he went out of rhythm at that time ? ?Otherwise, blood pressure is well controlled. He is hoping to return to work next week.  ? ?He denies any palpitations, chest pain, shortness of breath, or peripheral edema. No lightheadedness, headaches, syncope, orthopnea, or PND.  ? ?Past Medical History:  ?Diagnosis Date  ? Atrial flutter (HCC) 1[place]021  ? [facility]  ? Heart failure with reduced ejection fraction (HCC)  [place]   ? Hypoglycemia   ? occ  ? NICM (nonischemic cardiomyopathy) (HCC) 1[place]/2021  ? Prostate cancer (HCC) [employer]  ? Rheumatoid arthritis (HCC)  [place]? ?Past Surgical History:  ?Procedure Laterality Date  ? AMPUTATION TOE Right 12/2019  ? AMPUTATION TOE Right 03/26/2020  ? Procedure: Right 3rd toe partial amputation, bone biopsy right 1st metatarsal;  Surgeon: Price,Evelina Bucy  [facility];  Service: Podiatry;  Laterality: Right;  ? BUBBLE STUDY  02/11/2020  ? Procedure: BUBBLE STUDY;  Surgeon: Geralynn Rile, MD;  Location: [place];  Service: Cardiovascular;;  ? CARDIOVERSION N/A 02/11/2020  ? Procedure:  CARDIOVERSION;  Surgeon: Geralynn Rile, MD;  Location: [place];  Service: Cardiovascular;  Laterality: N/A;  ? CYSTOSCOPY  04/11/2018  ? Procedure: CYSTOSCOPY FLEXIBLE;  Surgeon: [name], MD;  Location: [facility];  Service: Urology;;  NO SEEDS FOUND IN BLADDER  ? HERNIA REPAIR  2009  ? inguinal  ? INTRAMEDULLARY (IM) NAIL INTERTROCHANTERIC Right 05/05/2020  ? Procedure: CPT 27245-Cephalomedullary nailing of right intertrochanteric femur fracture;  Surgeon: Shona Needles, MD;  Location: [facility];  Service: Orthopedics;  Laterality: Right;  ? INTRAMEDULLARY (IM) NAIL INTERTROCHANTERIC Right 12/24/2020  ? Procedure: REVISION FIXATION OF RIGHT INTERTROCHANTERIC FEMUR NONUNION;  Surgeon: Shona Needles, MD;  Location: [facility];  Service: Orthopedics;  Laterality: Right;  ? IRRIGATION AND DEBRIDEMENT FOOT Left 03/26/2020  ? Procedure: Left foot incision and drainage with removal of all non-viable soft tissue and bone - areas overlying the 2nd and 5th metatarsals.;  Surgeon: Evelina Bucy, DPM;  [facility];  Service: Podiatry;  Laterality: Left;  ? RADIOACTIVE SEED IMPLANT N/A 04/11/2018  ? Procedure: RADIOACTIVE SEED IMPLANT/BRACHYTHERAPY IMPLANT;  Surgeon: [name], MD;  Location: [facility];  Service: Urology;  Laterality: N/A;   71     SEEDS IMPLANTED  ? RIGHT/LEFT HEART CATH AND CORONARY ANGIOGRAPHY N/A 02/09/2020  ? Procedure: RIGHT/LEFT HEART CATH AND CORONARY ANGIOGRAPHY;  Surgeon: Nelva Bush, MD;  Location: [facility];  Service: Cardiovascular;  Laterality: N/A;  ? SPACE OAR INSTILLATION N/A 04/11/2018  ? Procedure: SPACE OAR INSTILLATION;  Surgeon: [name], MD;  Location: [facility];  Service: Urology;  Laterality: N/A;  ? TEE WITHOUT CARDIOVERSION N/A 02/11/2020  ? Procedure: TRANSESOPHAGEAL ECHOCARDIOGRAM (TEE);  Surgeon: Geralynn Rile, MD;  Location: [place];  Service: Cardiovascular;   Laterality: N/A;  ? TOTAL KNEE ARTHROPLASTY Bilateral 09/13/2015  ? Procedure: BILATERAL KNEE ARTHROPLASTY ;  Surgeon: Paralee Cancel, MD;  [facility];  Service: Orthopedics;  Laterality: Bilateral;  ? ? ?Current Medications: ?Current Meds  ?Medication Sig  ? amiodarone (PACERONE) 200 MG tablet Take 1 tablet (200 mg total) by mouth daily.  ? apixaban (ELIQUIS) 5 MG TABS tablet Take 1 tablet (5 mg total) by mouth 2 (two) times daily.  ? diazepam (VALIUM) 5 MG tablet 5 mg at bedtime as needed for bladder spasms. Use rectally  ? diazepam (VALIUM) 5 MG tablet Take 1 tablet (5 mg total) by mouth at bedtime as needed.  ? doxycycline (VIBRA-TABS) 100 MG tablet Take 1 tablet (100 mg total) by mouth 2 (two) times daily.  ? DULoxetine (CYMBALTA) 60 MG capsule TAKE 1 CAPSULE BY MOUTH EVERY DAY - PLS MAKE AN APPOINTMENT FOR AN OFFICE VISIT  ? lisinopril (ZESTRIL) 10 MG tablet Take 1 tablet (10 mg total) by mouth daily.  ? metoprolol succinate (TOPROL XL) 25 MG 24 hr tablet Take 1 tablet (25 mg total) by mouth daily.  ? NARCAN 4 MG/0.1ML LIQD nasal spray kit Place 1 spray into the nose as needed (as directed for emergency).  ? oxyCODONE (OXY IR/ROXICODONE) 5 MG immediate release tablet Take 1 tablet (5 mg total) by mouth every 4 (four) hours as needed for pain.  ? oxyCODONE (OXY IR/ROXICODONE) 5 MG immediate release tablet  Take 1 tablet (5 mg total) by mouth every 4 (four) hours as needed for pain.  ? oxyCODONE (OXY IR/ROXICODONE) 5 MG immediate release tablet Take 1 tablet by mouth every four hours as needed for pain  ? oxyCODONE (ROXICODONE) 15 MG immediate release tablet Take 1 tablet (15 mg total) by mouth every 4 (four) hours as needed for severe pain.  ? oxyCODONE (ROXICODONE) 15 MG immediate release tablet Take 1 tablet (15 mg total) by mouth every 4 (four) hours as needed for severe pain  ? oxyCODONE (ROXICODONE) 15 MG immediate release tablet Take 1 tablet (15 mg total) by mouth every 4 (four) hours as needed for  severe pain  ? oxycodone (ROXICODONE) 30 MG immediate release tablet Take 1 tablet by mouth every 4 hours as needed for pain (fill 03/19/21)  ? oxycodone (ROXICODONE) 30 MG immediate release tablet Take 1 tablet (30 mg

## 2021-08-02 NOTE — Progress Notes (Signed)
? ? ?  SUBJECTIVE:  ? ?CHIEF COMPLAINT / HPI: Right elbow swelling ? ?71 year old male who presents for right elbow swelling and has been going on since Friday. Pain started on Friday and on Saturday he noticed the swelling. He was unable to extend extremities because of his arthritis, however the swelling and stiffness has continued to improve since Sunday. Patient report his RA has gotten worse since stopping methotrexate for treatment of osteomyelitis of his right foot with antibiotics for osteomyelitis. Sees doctor patel at [facility]. Patient reports tenderness that's much improved but denies any trauma to his elbow. ? ?PERTINENT  PMH / PSH:, Hypertension, rheumatoid arthritis, A-fib. ? ?OBJECTIVE:  ? ?BP (!) 153/90   Pulse 78   Wt 220 lb (99.8 kg)   SpO2 99%   BMI 24.17 kg/m?   ? ?Physical Exam ?General: Alert, well appearing, NAD ?Cardiovascular: RRR, No Murmurs, Normal S2/S2 ?Extremities:Mild swelling on the media aspect of the elbow. Tender without erythema. He has normal flexion but extension is restricted to about 90 degrees. ? ? ?ASSESSMENT/PLAN:  ? ?Rheumatoid arthritis ([place]) ?71 year old male with history of rheumatoid arthritis presents today for swelling on his right elbow. Per Ortho recommendation he is holding methotrexate for treatment of osteomyelitis in the right foot. Patient's presentation is more consistent with rheumatoid arthritis flare up than gout given current suspension of his medication.  Recommend use of Voltaren gel or lidocaine patch on the elbow.  Encourage ability of the right elbow. Recommend Tylenol over ibuprofen given patient is on Eliquis.  Benefits of physical therapy and patient with consider PT if pain worsened in the next 2 weeks. Discussed return precautions with patient which he he verbalized understanding and is agreeable to plan.  ?  ? ? ?Alen Bleacher, MD ?[place]  ? ? ?

## 2021-08-02 NOTE — Patient Instructions (Signed)
It was wonderful to meet you today. Thank you for allowing me to be a part of your care. Below is a short summary of what we discussed at your visit today: ? ?Your right elbow swelling and is likely flareup from your arthritis. ? ?I recommend using Voltaren gel or lidocaine patch at this time.  Given that you are on Eliquis we will avoid ibuprofen at this time. ? ?Please return in 2 weeks if symptoms are worse. ? ? ?If you have any questions or concerns, please do not hesitate to contact us via phone or MyChart message.  ? ?Alen Bleacher, MD ?[facility]  ?

## 2021-08-02 NOTE — Assessment & Plan Note (Signed)
71 year old male with history of rheumatoid arthritis presents today for swelling on his right elbow. Per Ortho recommendation he is holding methotrexate for treatment of osteomyelitis in the right foot. Patient's presentation is more consistent with rheumatoid arthritis flare up than gout given current suspension of his medication.  Recommend use of Voltaren gel or lidocaine patch on the elbow.  Encourage ability of the right elbow. Recommend Tylenol over ibuprofen given patient is on Eliquis.  Benefits of physical therapy and patient with consider PT if pain worsened in the next 2 weeks. Discussed return precautions with patient which he he verbalized understanding and is agreeable to plan.  ?

## 2021-08-03 ENCOUNTER — Ambulatory Visit: Payer: Medicare Other

## 2021-08-03 ENCOUNTER — Telehealth: Payer: Self-pay | Admitting: Cardiology

## 2021-08-03 NOTE — Patient Instructions (Signed)
Visit Information ? ?Mr. Formosa  it was nice speaking with you. Please call me directly [phone] if you have questions about the goals we discussed. ? ?The patient verbalized understanding of instructions, educational materials, and care plan provided today and agreed to receive a mailed copy of patient instructions, educational materials, and care plan.  ? ?Follow up Plan: If further intervention is needed the care management team is available to follow up after a formal CCM referral is placed  and No follow up scheduled with CCM team at this time ? ?Lazaro Arms, RN ? ?[phone]  ?

## 2021-08-03 NOTE — Chronic Care Management (AMB) (Signed)
?  Care Management  ? Clinical RN Case Manager Consultation  ? ?08/03/2021 ?Name: Thomas [name] MRN: [id number] DOB: [birth year] ? ?Thomas [name] is a 71 y.o. year old male who is a primary care patient of Chambliss, Jeb Levering, MD . RN Case Manager was consulted for assistance with High Risk List ? ?RN Case Manager  reached out to [name] today by phone to introduce self, assess needs and provide intervention. ? ? ?Summary: I contacted Thomas [name] by telephone for an initial visit; he lives in his home with his family. I talked with him and explained my-workers role and mine with CCM. Thomas [name] informed me that he could perform his Adls and Iadls. I completed a review of his medical history, allergies, pain, medications, falls, and health status and inquired about SDOH; no needs were identified. He states that he checks his blood pressure regularly, ranging from 130-135 over 70, and he weighs at least once a week, and his weight stays stable. He declined services, but I advised him if he would like our services in the future, to let his PCP know, and he could contact us. I told him I would send information in the mail, thanking him for the call. ? ? ?Follow up Plan: If further intervention is needed the care management team is available to follow up after a formal CCM referral is placed  and No follow up scheduled with CCM team at this time ? ? ?No Care Plan Established for this encounter ? ? ?Review of patient status, including review of consultants reports, relevant laboratory and other test results, and collaboration with appropriate care team members and the patient's provider was performed as part of comprehensive patient evaluation and provision of care management services.   ? ? ? ? ?

## 2021-08-03 NOTE — Telephone Encounter (Signed)
The patient has been notified of the result and verbalized understanding.  All questions (if any) were answered. ?Rodman Key, RN 08/03/2021 10:07 AM  ? ?

## 2021-08-03 NOTE — Telephone Encounter (Signed)
Pt returning call regarding results. Please advise 

## 2021-08-04 ENCOUNTER — Ambulatory Visit
Admission: RE | Admit: 2021-08-04 | Discharge: 2021-08-04 | Disposition: A | Discharge status: Home / Self Care | Payer: Medicare Other | Source: Ambulatory Visit | Attending: Student | Admitting: Student

## 2021-08-04 DIAGNOSIS — M1611 Unilateral primary osteoarthritis, right hip: Secondary | ICD-10-CM | POA: Diagnosis not present

## 2021-08-04 DIAGNOSIS — I739 Peripheral vascular disease, unspecified: Secondary | ICD-10-CM | POA: Diagnosis not present

## 2021-08-04 DIAGNOSIS — S72141A Displaced intertrochanteric fracture of right femur, initial encounter for closed fracture: Secondary | ICD-10-CM | POA: Diagnosis not present

## 2021-08-04 DIAGNOSIS — S72141D Displaced intertrochanteric fracture of right femur, subsequent encounter for closed fracture with routine healing: Secondary | ICD-10-CM

## 2021-08-04 IMAGING — CT CT HIP*R* W/O CM
1 series · 15 of 32 positions shown, 19 images · non-contrast
Comparison: [DATE]

CLINICAL DATA: Right intertrochanteric fracture. Evaluate for
healing.

EXAM:
CT OF THE RIGHT HIP WITHOUT CONTRAST
TECHNIQUE: Multidetector CT imaging of the right hip was performed according to
the standard protocol. Multiplanar CT image reconstructions were
also generated.
RADIATION DOSE REDUCTION: This exam was performed according to the
departmental dose-optimization program which includes automated
exposure control, adjustment of the mA and/or kV according to
patient size and/or use of iterative reconstruction technique.

[Series 5: soft tissue pelvis/hip (person_name) · axial · 0.47mm/px · z∈[-318,+10]mm · 15 of 121 slices shown, 19 images]
[im 8/121  soft-tissue]
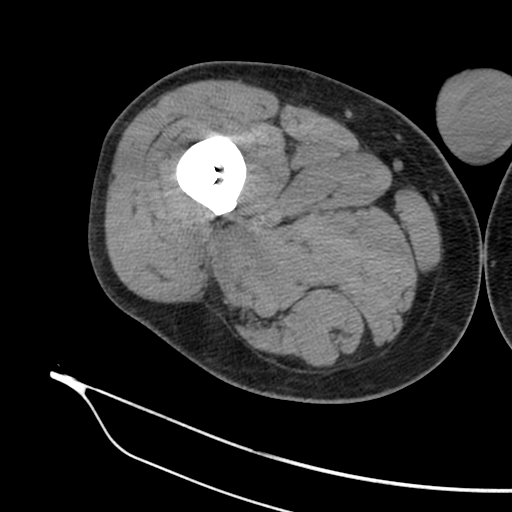
[im 8/121  bone]
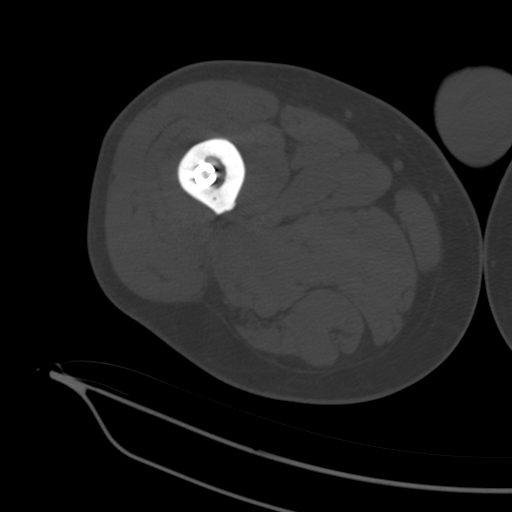
[im 16/121  soft-tissue]
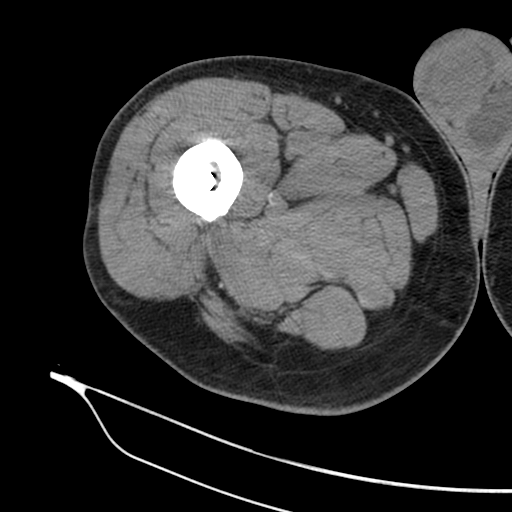
[im 24/121  soft-tissue]
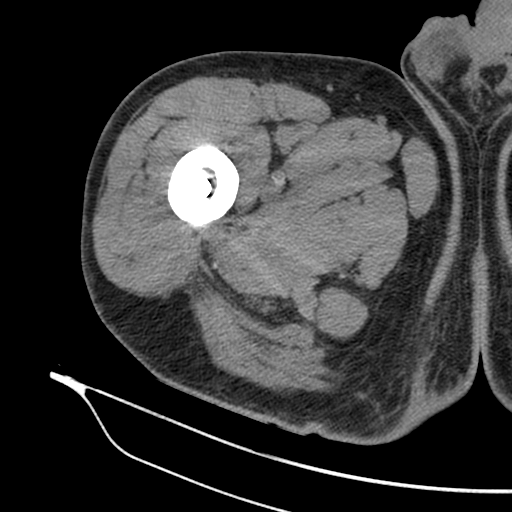
[im 35/121  soft-tissue]
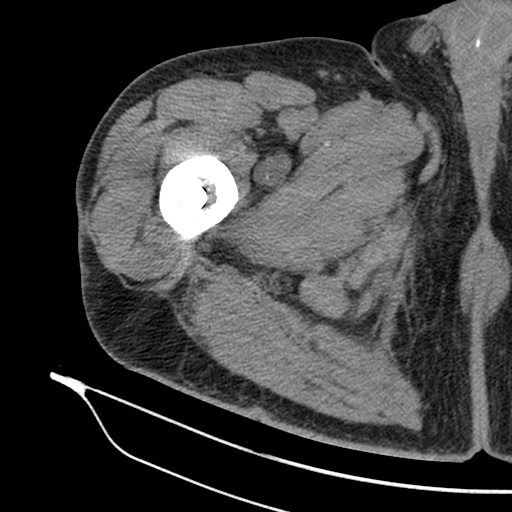
[im 43/121  soft-tissue]
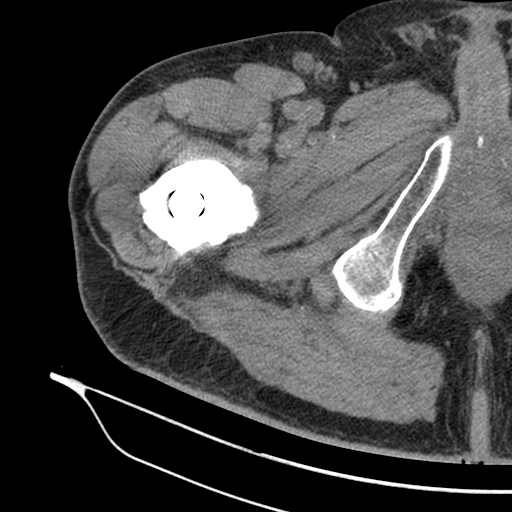
[im 51/121  soft-tissue]
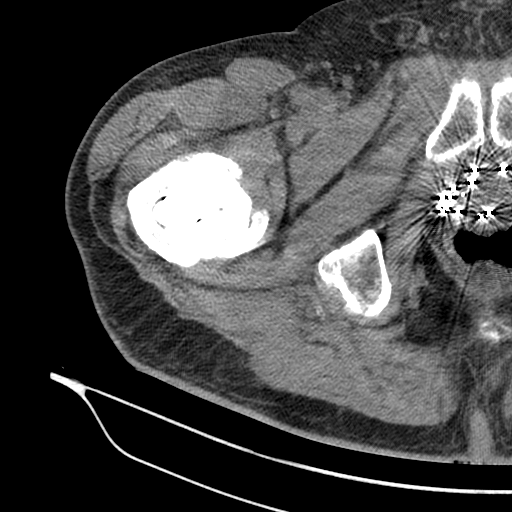
[im 62/121  soft-tissue]
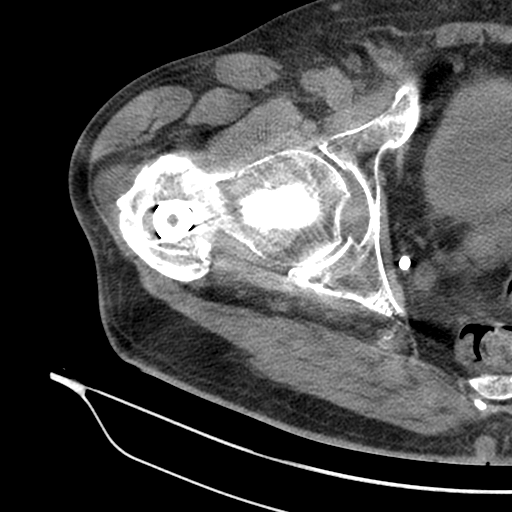
[im 70/121  soft-tissue]
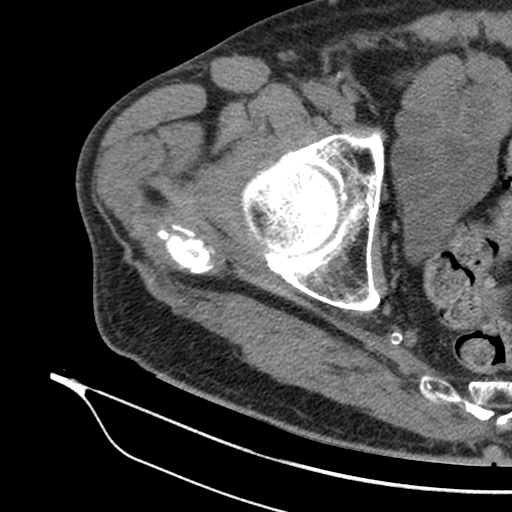
[im 78/121  soft-tissue]
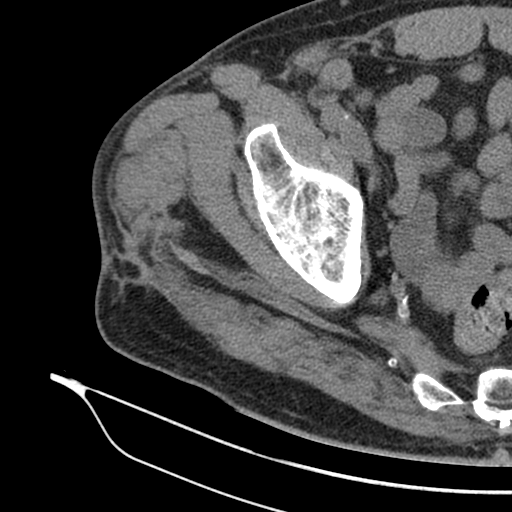
[im 78/121  bone]
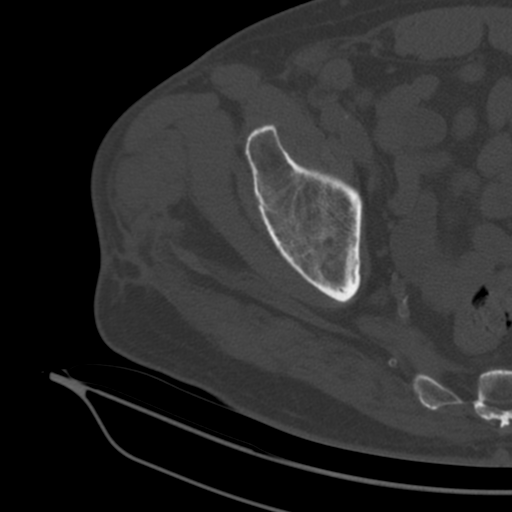
[im 86/121  soft-tissue]
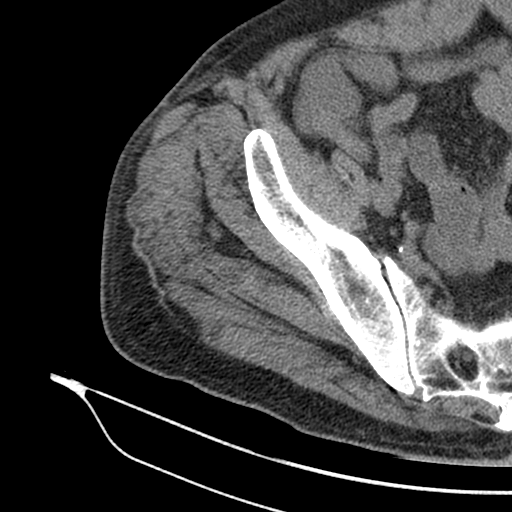
[im 97/121  soft-tissue]
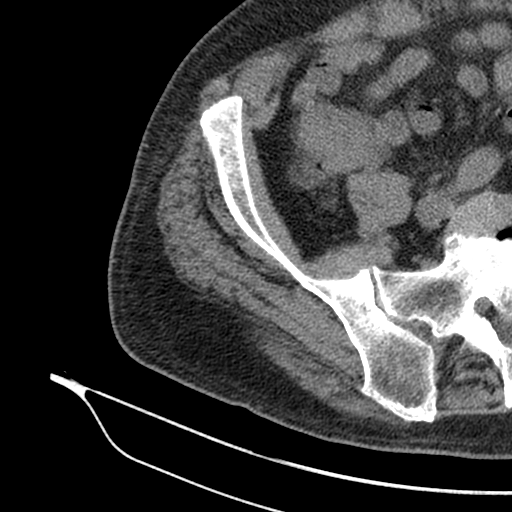
[im 105/121  soft-tissue]
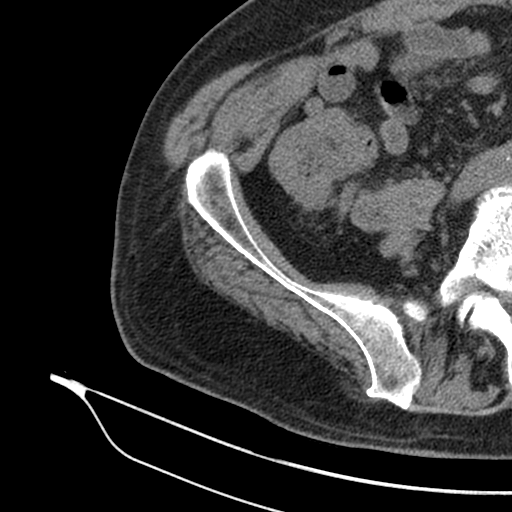
[im 105/121  lung]
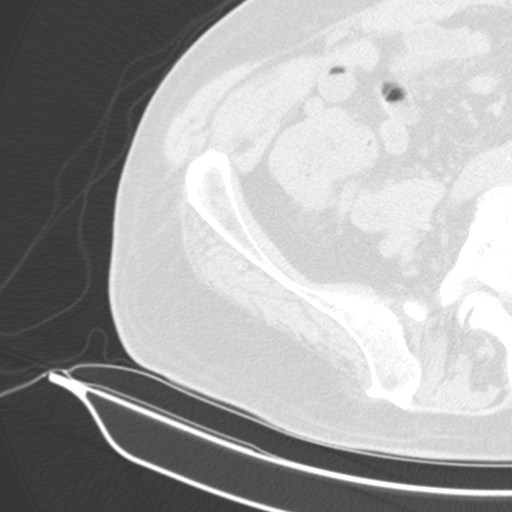
[im 109/121  lung]
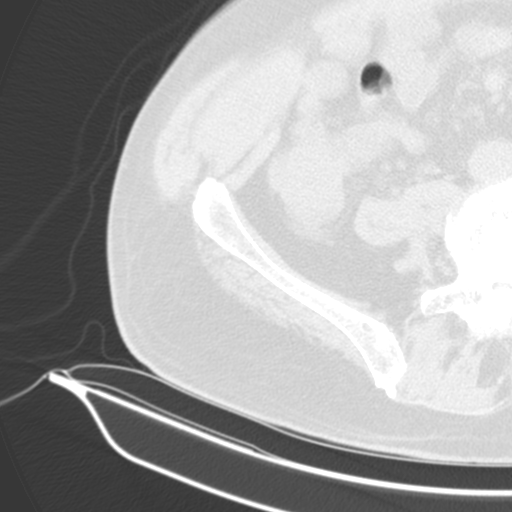
[im 113/121  soft-tissue]
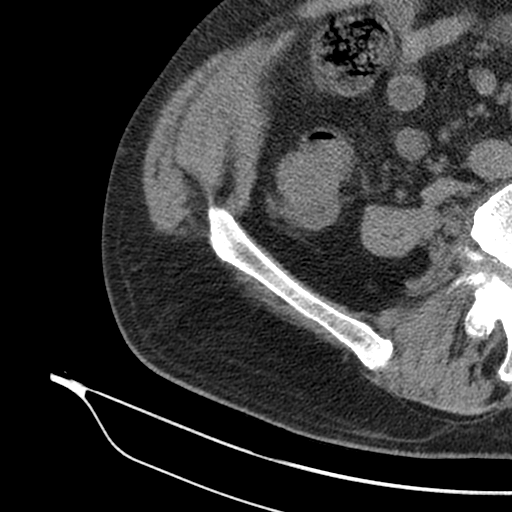
[im 113/121  lung]
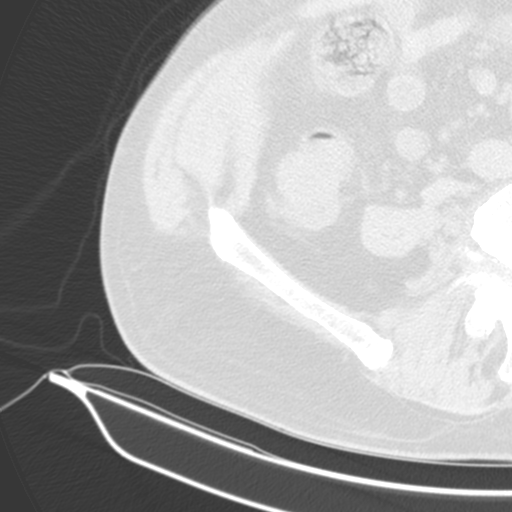
[im 117/121  lung]
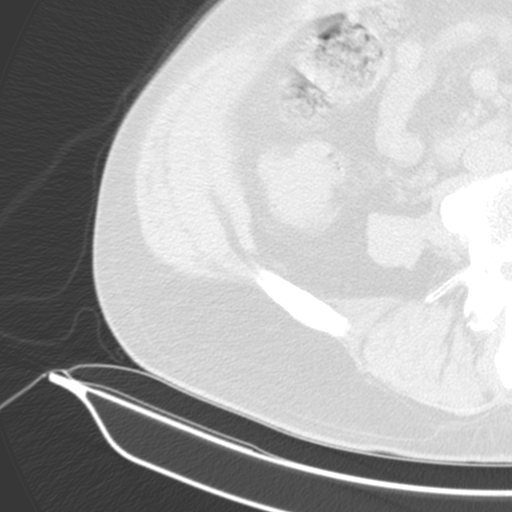

[15 of 32 positions shown; findings below may reference images not displayed]

FINDINGS: Bones/Joint/Cartilage

No acute fracture or dislocation.

Right intertrochanteric fracture transfixed with an intramedullary
nail and 2 interlocking femoral neck screws. Lucency around the
proximal intramedullary nail at the level of the greater trochanter
consistent with loosening. Persistent ununited fracture cleft along
the medial aspect of the fracture without significant callus
formation or bone bridging.

Mild osteoarthritis of the right hip. Mild osteoarthritis of the
right SI joint. Normal alignment. No joint effusion.

Ligaments

Ligaments are suboptimally evaluated by CT.

Muscles and Tendons
Muscles are normal. No muscle atrophy. No intramuscular fluid
collection or hematoma.

Soft tissue
No fluid collection or hematoma. No soft tissue mass. Fat containing
right inguinal hernia. Peripheral vascular atherosclerotic disease.
IMPRESSION: 1. Persistent ununited right intertrochanteric fracture transfixed
with an intramedullary nail and 2 interlocking femoral neck screws.
Lucency around the proximal intramedullary nail at the level of the
greater trochanter consistent with loosening.

## 2021-08-09 ENCOUNTER — Encounter (HOSPITAL_COMMUNITY): Payer: Self-pay | Admitting: Internal Medicine

## 2021-08-09 NOTE — Progress Notes (Signed)
Attempted to obtain medical history via telephone, unable to reach at this time. I left a voicemail to return pre surgical testing department's phone call.  

## 2021-08-10 ENCOUNTER — Other Ambulatory Visit: Payer: Self-pay

## 2021-08-10 DIAGNOSIS — I739 Peripheral vascular disease, unspecified: Secondary | ICD-10-CM

## 2021-08-12 ENCOUNTER — Ambulatory Visit: Payer: Medicare Other | Admitting: Internal Medicine

## 2021-08-15 ENCOUNTER — Encounter (HOSPITAL_COMMUNITY): Payer: Self-pay | Admitting: Certified Registered Nurse Anesthetist

## 2021-08-15 ENCOUNTER — Encounter: Payer: Medicare Other | Admitting: Surgery

## 2021-08-15 ENCOUNTER — Encounter (HOSPITAL_COMMUNITY): Payer: Medicare Other

## 2021-08-16 ENCOUNTER — Ambulatory Visit (HOSPITAL_COMMUNITY): Admission: RE | Admit: 2021-08-16 | Payer: Medicare Other | Source: Ambulatory Visit | Admitting: Internal Medicine

## 2021-08-16 SURGERY — CARDIOVERSION
Anesthesia: Monitor Anesthesia Care

## 2021-08-16 NOTE — Progress Notes (Signed)
Patient called at 0720 to inquire about his 0800 cardioversion.  Patient stated that he thought it was on Thursday and would not be able to make it this morning.  Case cancelled and Dr. Johney Frame made aware.   ?

## 2021-08-18 ENCOUNTER — Ambulatory Visit (HOSPITAL_COMMUNITY): Payer: Medicare Other | Attending: Cardiology

## 2021-08-18 DIAGNOSIS — I4892 Unspecified atrial flutter: Secondary | ICD-10-CM | POA: Diagnosis not present

## 2021-08-18 DIAGNOSIS — I5042 Chronic combined systolic (congestive) and diastolic (congestive) heart failure: Secondary | ICD-10-CM | POA: Insufficient documentation

## 2021-08-18 LAB — ECHOCARDIOGRAM LIMITED
Area-P 1/2: 5.38 cm2
S' Lateral: 3.8 cm

## 2021-08-19 ENCOUNTER — Telehealth: Payer: Self-pay

## 2021-08-19 DIAGNOSIS — I4892 Unspecified atrial flutter: Secondary | ICD-10-CM

## 2021-08-19 NOTE — Telephone Encounter (Signed)
-----   Message from Nuala Alpha, LPN sent at 6/41/5830 10:34 AM EDT ----- ? ?----- Message ----- ?From: Precious Gilding, RN ?Sent: 08/19/2021   9:42 AM EDT ?To: Nuala Alpha, LPN ? ? ?----- Message ----- ?From: Freada Bergeron, MD ?Sent: 08/19/2021   8:03 AM EDT ?To: [facility] Triage ? ?His pumping function dropped back down to 30-35% with recurrence of his atrial flutter. We need to get him back into rhythm. Can we reschedule his DCCV? I think he would benefit from seeing a EP doctor as well to discuss a possible ablation procedure to try and prevent him from going back into Aflutter given that his heart pumping function weakens with this rhythm. Is he willing to discuss with them further? ? ? ?

## 2021-08-19 NOTE — Telephone Encounter (Signed)
Called patient to tell him about echo and see when he can come in for DCCV. Scheduled DCCV for May 10th with Dr. Marlou Porch, this was the next available time. Patient was agreeable to see EP doctor. Will place referral. Patient will come in on 08/25/21 for lab work and pick up instructions from front desk. Reviewed instructions with patient over the phone. ? ? ?

## 2021-08-23 DIAGNOSIS — M898X5 Other specified disorders of bone, thigh: Secondary | ICD-10-CM | POA: Diagnosis not present

## 2021-08-23 DIAGNOSIS — M25551 Pain in right hip: Secondary | ICD-10-CM | POA: Diagnosis not present

## 2021-08-24 ENCOUNTER — Encounter (HOSPITAL_COMMUNITY): Payer: Self-pay | Admitting: Cardiology

## 2021-08-24 ENCOUNTER — Telehealth: Payer: Self-pay

## 2021-08-24 NOTE — Telephone Encounter (Addendum)
? ?  Patient Name: Thomas [name]  ?DOB: 29-Jan-1951 ?MRN: [id number] ? ?Primary Cardiologist: Freada Bergeron, MD ? ?Chart reviewed as part of pre-operative protocol coverage. Received clearance for  Right Hip IM Nail Conversion to total hip arthroplasty. Patient was seen recently in office by Dr. Johney Frame on 08/02/2021. During visit patient was scheduled for DCCV however was rescheduled for 08/31/2021. Will route to Dr. Johney Frame for review of sequence of events - should we keep plan for DCCV as scheduled and reassess surgical clearance afterwards, or should cardioversion be deferred for now in lieu of pursuing hip surgery? Last EF further declined to 30-35% by echo 08/18/21, felt to be NICM (cath 01/2020 with no CAD). I will forward to pharm team for recommendation regarding hold time for his Eliquis and will await feedback from Dr. Johney Frame regarding need for DCCV before hip surgery. ? ?We'll decide plan for phone call after MD weighs in. ? ?Jimmy Picket, NP ?08/24/2021, 2:28 PM ?  ?

## 2021-08-24 NOTE — Telephone Encounter (Signed)
? ?  Pre-operative Risk Assessment  ?  ?Patient Name: Thomas [name]  ?DOB: 02-14-1951 ?MRN: [id number]  ? ?  ? ?Request for Surgical Clearance   ? ?Procedure:   Right Hip IM Nail Conversion to total hip arthroplasty ? ?Date of Surgery:  Clearance TBD                              ?   ?Surgeon:  Charlies Constable, MD ?Surgeon's Group or Practice Name:  [facility] ?Phone number:  [phone] ?Fax number:  [phone] ?  ?Type of Clearance Requested:   ?- Medical  ?- Pharmacy:  Hold Apixaban (Eliquis)   ?  ?Type of Anesthesia:  Spinal ?  ?Additional requests/questions:   n/a ? ?Signed, ?Ulice Brilliant T   ?08/24/2021, 1:42 PM  ? ?

## 2021-08-24 NOTE — Telephone Encounter (Signed)
Patient with diagnosis of atrial fibrillation on Eliquis for anticoagulation.   ? ?Procedure: Right hop IM Nail conversion to THA ?Date of procedure: TBD ? ? ?CHA2DS2-VASc Score = 3  ? This indicates a 3.2% annual risk of stroke. ?The patient's score is based upon: ?CHF History: 1 ?HTN History: 1 ?Diabetes History: 0 ?Stroke History: 0 ?Vascular Disease History: 0 ?Age Score: 1 ?Gender Score: 0 ? ?CrCl 126 ?Platelet count 343 ? ?Per office protocol, patient can hold Eliquis for 3 days prior to procedure.   ?Patient will not need bridging with Lovenox (enoxaparin) around procedure. ? ?For orthopedic procedures please be sure to resume therapeutic (not prophylactic) dosing. ? ?

## 2021-08-24 NOTE — Telephone Encounter (Signed)
Claiborne Billings returned [employer] call requesting clarification on what is being performed, stating "Im Nail" stands for intramedullary nail. ?

## 2021-08-25 ENCOUNTER — Other Ambulatory Visit: Payer: Medicare Other

## 2021-08-26 ENCOUNTER — Other Ambulatory Visit: Payer: Medicare Other | Admitting: *Deleted

## 2021-08-26 DIAGNOSIS — I4892 Unspecified atrial flutter: Secondary | ICD-10-CM | POA: Diagnosis not present

## 2021-08-27 LAB — BASIC METABOLIC PANEL
BUN/Creatinine Ratio: 20 (ref 10–24)
BUN: 20 mg/dL (ref 8–27)
CO2: 21 mmol/L (ref 20–29)
Calcium: 9.5 mg/dL (ref 8.6–10.2)
Chloride: 104 mmol/L (ref 96–106)
Creatinine, Ser: 1 mg/dL (ref 0.76–1.27)
Glucose: 151 mg/dL — ABNORMAL HIGH (ref 70–99)
Potassium: 3.9 mmol/L (ref 3.5–5.2)
Sodium: 140 mmol/L (ref 134–144)
eGFR: 82 mL/min/{1.73_m2} (ref >59)

## 2021-08-27 LAB — CBC
Hematocrit: 38.6 % (ref 37.5–51.0)
Hemoglobin: 12.1 g/dL — ABNORMAL LOW (ref 13.0–17.7)
MCH: 24.7 pg — ABNORMAL LOW (ref 26.6–33.0)
MCHC: 31.3 g/dL — ABNORMAL LOW (ref 31.5–35.7)
MCV: 79 fL (ref 79–97)
Platelets: 337 10*3/uL (ref 150–450)
RBC: 4.89 x10E6/uL (ref 4.14–5.80)
RDW: 17.2 % — ABNORMAL HIGH (ref 11.6–15.4)
WBC: 9 10*3/uL (ref 3.4–10.8)

## 2021-08-30 ENCOUNTER — Telehealth: Payer: Self-pay | Admitting: Cardiology

## 2021-08-30 NOTE — Telephone Encounter (Signed)
Attempted phone call to pt and left voicemail message to contact RN at [phone]. 

## 2021-08-30 NOTE — Telephone Encounter (Signed)
?  Pt would like to clarify what time he needs to be at the hospital tomorrow for his cardioversion  ?

## 2021-08-30 NOTE — Telephone Encounter (Signed)
Spoke with pt and reviewed instruction letter for cardioversion scheduled for 08/31/2021.  See letter for complete details.  Pt verbalizes understanding and thanked Therapist, sports for the call.  ?

## 2021-08-31 ENCOUNTER — Ambulatory Visit (HOSPITAL_COMMUNITY)
Admission: RE | Admit: 2021-08-31 | Discharge: 2021-08-31 | Disposition: A | Discharge status: Home / Self Care | Payer: Medicare Other | Attending: Cardiology | Admitting: Cardiology

## 2021-08-31 ENCOUNTER — Ambulatory Visit (HOSPITAL_COMMUNITY): Payer: Medicare Other | Admitting: Certified Registered Nurse Anesthetist

## 2021-08-31 ENCOUNTER — Other Ambulatory Visit: Payer: Self-pay

## 2021-08-31 ENCOUNTER — Ambulatory Visit (HOSPITAL_BASED_OUTPATIENT_CLINIC_OR_DEPARTMENT_OTHER): Payer: Medicare Other | Admitting: Certified Registered Nurse Anesthetist

## 2021-08-31 ENCOUNTER — Encounter (HOSPITAL_COMMUNITY)
Admission: RE | Disposition: A | Discharge status: Home / Self Care | Payer: Self-pay | Source: Home / Self Care | Attending: Cardiology

## 2021-08-31 ENCOUNTER — Encounter (HOSPITAL_COMMUNITY): Payer: Self-pay | Admitting: Cardiology

## 2021-08-31 DIAGNOSIS — Z79899 Other long term (current) drug therapy: Secondary | ICD-10-CM | POA: Diagnosis not present

## 2021-08-31 DIAGNOSIS — I110 Hypertensive heart disease with heart failure: Secondary | ICD-10-CM

## 2021-08-31 DIAGNOSIS — I4892 Unspecified atrial flutter: Secondary | ICD-10-CM

## 2021-08-31 DIAGNOSIS — D6869 Other thrombophilia: Secondary | ICD-10-CM | POA: Insufficient documentation

## 2021-08-31 DIAGNOSIS — M069 Rheumatoid arthritis, unspecified: Secondary | ICD-10-CM

## 2021-08-31 DIAGNOSIS — Z89429 Acquired absence of other toe(s), unspecified side: Secondary | ICD-10-CM | POA: Insufficient documentation

## 2021-08-31 DIAGNOSIS — I5042 Chronic combined systolic (congestive) and diastolic (congestive) heart failure: Secondary | ICD-10-CM | POA: Diagnosis not present

## 2021-08-31 DIAGNOSIS — Z7901 Long term (current) use of anticoagulants: Secondary | ICD-10-CM | POA: Diagnosis not present

## 2021-08-31 DIAGNOSIS — F32A Depression, unspecified: Secondary | ICD-10-CM | POA: Diagnosis not present

## 2021-08-31 DIAGNOSIS — I509 Heart failure, unspecified: Secondary | ICD-10-CM

## 2021-08-31 DIAGNOSIS — I484 Atypical atrial flutter: Secondary | ICD-10-CM | POA: Insufficient documentation

## 2021-08-31 DIAGNOSIS — I428 Other cardiomyopathies: Secondary | ICD-10-CM | POA: Insufficient documentation

## 2021-08-31 DIAGNOSIS — Z8546 Personal history of malignant neoplasm of prostate: Secondary | ICD-10-CM | POA: Diagnosis not present

## 2021-08-31 HISTORY — PX: CARDIOVERSION: SHX1299

## 2021-08-31 SURGERY — CARDIOVERSION
Anesthesia: General

## 2021-08-31 MED ORDER — PROPOFOL 10 MG/ML IV BOLUS
INTRAVENOUS | Status: DC | PRN
  Administered 2021-08-31: 70 mg via INTRAVENOUS
  Administered 2021-08-31: 20 mg via INTRAVENOUS

## 2021-08-31 MED ORDER — SODIUM CHLORIDE 0.9 % IV SOLN: INTRAVENOUS | Status: DC

## 2021-08-31 MED ORDER — LIDOCAINE 2% (20 MG/ML) 5 ML SYRINGE
INTRAMUSCULAR | Status: DC | PRN
  Administered 2021-08-31: 60 mg via INTRAVENOUS

## 2021-08-31 NOTE — Anesthesia Preprocedure Evaluation (Addendum)
Anesthesia Evaluation  ?Patient identified by MRN, date of birth, ID band ?Patient awake ? ? ? ?Reviewed: ?Allergy & Precautions, NPO status , Patient's Chart, lab work & pertinent test results ? ?History of Anesthesia Complications ?Negative for: history of anesthetic complications ? ?Airway ?Mallampati: II ? ?TM Distance: >3 FB ?Neck ROM: Full ? ? ? Dental ?  ?Pulmonary ?neg pulmonary ROS,  ?  ?Pulmonary exam normal ? ? ? ? ? ? ? Cardiovascular ?hypertension, +CHF  ?+ dysrhythmias Atrial Fibrillation  ?Rhythm:Regular Rate:Tachycardia ? ? ?Echo 08/18/21: EF 30-35%, global hypokinesis, mildly reduced RVSF, mod LAE, valves unremarkable ? ?[place] 2021:  ?1. No angiographically significant coronary artery disease. ?2. Upper normal left heart, right heart, and pulmonary artery pressures. ?3. Mildly reduced Fick cardiac output/index. ?  ?Neuro/Psych ?negative neurological ROS ?   ? GI/Hepatic ?negative GI ROS, Neg liver ROS,   ?Endo/Other  ?negative endocrine ROS ? Renal/GU ?negative Renal ROS  ? ?Prostate cancer ? ?  ?Musculoskeletal ? ?(+) Arthritis , Rheumatoid disorders,   ? Abdominal ?  ?Peds ? Hematology ?negative hematology ROS ?(+)   ?Anesthesia Other Findings ? ? Reproductive/Obstetrics ? ?  ? ? ? ? ? ? ? ? ? ? ? ? ? ?  ?  ? ? ? ? ? ? ?Anesthesia Physical ?Anesthesia Plan ? ?ASA: 3 ? ?Anesthesia Plan: General  ? ?Post-op Pain Management: Minimal or no pain anticipated  ? ?Induction: Intravenous ? ?PONV Risk Score and Plan: 2 and TIVA and Treatment may vary due to age or medical condition ? ?Airway Management Planned: Mask ? ?Additional Equipment: None ? ?Intra-op Plan:  ? ?Post-operative Plan:  ? ?Informed Consent: I have reviewed the patients History and Physical, chart, labs and discussed the procedure including the risks, benefits and alternatives for the proposed anesthesia with the patient or authorized representative who has indicated his/her understanding and acceptance.   ? ? ? ? ? ?Plan Discussed with:  ? ?Anesthesia Plan Comments:   ? ? ? ? ? ?Anesthesia Quick Evaluation ? ?

## 2021-08-31 NOTE — Interval H&P Note (Signed)
History and Physical Interval Note: ? ?08/31/2021 ?10:06 AM ? ?Thomas [name]  has presented today for surgery, with the diagnosis of AFLUTTER.  The various methods of treatment have been discussed with the patient and family. After consideration of risks, benefits and other options for treatment, the patient has consented to  Procedure(s): ?CARDIOVERSION (N/A) as a surgical intervention.  The patient's history has been reviewed, patient examined, no change in status, stable for surgery.  I have reviewed the patient's chart and labs.  Questions were answered to the patient's satisfaction.   ? ? ?Candee Furbish ? ? ?

## 2021-08-31 NOTE — Anesthesia Postprocedure Evaluation (Signed)
Anesthesia Post Note ? ?Patient: Thomas [name] ? ?Procedure(s) Performed: CARDIOVERSION ? ?  ? ?Patient location during evaluation: Endoscopy ?Anesthesia Type: General ?Level of consciousness: awake and alert ?Pain management: pain level controlled ?Vital Signs Assessment: post-procedure vital signs reviewed and stable ?Respiratory status: spontaneous breathing, nonlabored ventilation and respiratory function stable ?Cardiovascular status: blood pressure returned to baseline and stable ?Postop Assessment: no apparent nausea or vomiting ?Anesthetic complications: no ? ? ?No notable events documented. ? ?Last Vitals:  ?Vitals:  ? 08/31/21 1030 08/31/21 1040  ?BP: 121/78 138/88  ?Pulse:    ?Resp:    ?Temp:    ?SpO2:    ?  ?Last Pain:  ?Vitals:  ? 08/31/21 1040  ?TempSrc:   ?PainSc: 0-No pain  ? ? ?  ?  ?  ?  ?  ?  ? ?Lidia Collum ? ? ? ? ?

## 2021-08-31 NOTE — Transfer of Care (Signed)
Immediate Anesthesia Transfer of Care Note ? ?Patient: Thomas [name] ? ?Procedure(s) Performed: CARDIOVERSION ? ?Patient Location: Endoscopy Unit ? ?Anesthesia Type:General ? ?Level of Consciousness: drowsy ? ?Airway & Oxygen Therapy: Patient Spontanous Breathing and Patient connected to face mask oxygen ? ?Post-op Assessment: Report given to RN and Post -op Vital signs reviewed and stable ? ?Post vital signs: Reviewed and stable ? ?Last Vitals:  ?Vitals Value Taken Time  ?BP 130/89   ?Temp    ?Pulse 57 08/31/21 1016  ?Resp 11 08/31/21 1016  ?SpO2 100 % 08/31/21 1016  ? ? ?Last Pain:  ?Vitals:  ? 08/31/21 0910  ?TempSrc: Temporal  ?PainSc: 7   ?   ? ?  ? ?Complications: No notable events documented. ?

## 2021-08-31 NOTE — CV Procedure (Signed)
? ? ?  Electrical Cardioversion Procedure Note ?[place] ?[id number] ?Dec 26, 1950 ? ?Procedure: Electrical Cardioversion ?Indications:  Atrial Fibrillation ? ?Time Out: Verified patient identification, verified procedure,medications/allergies/relevent history reviewed, required imaging and test results available.  Performed ? ?Procedure Details ? ?The patient was NPO after midnight. Anesthesia was administered at the beside  by Dr.Hodierne propofol.  Cardioversion was performed with synchronized biphasic defibrillation via AP pads with 200 joules.  1 attempt(s) were performed.  The patient converted to normal sinus rhythm. The patient tolerated the procedure well  ? ?IMPRESSION: ? ?Successful cardioversion of atrial fibrillation/flutter ? ? ? ?Candee Furbish ?08/31/2021, 10:14 AM ? ?  ?

## 2021-08-31 NOTE — Anesthesia Procedure Notes (Signed)
Procedure Name: [place] ?Date/Time: 08/31/2021 10:14 AM ?Performed by: Dorthea Cove, CRNA ?Pre-anesthesia Checklist: Patient identified, Emergency Drugs available, Suction available, Patient being monitored and Timeout performed ?Patient Re-evaluated:Patient Re-evaluated prior to induction ?Oxygen Delivery Method: Simple face mask ?Preoxygenation: Pre-oxygenation with 100% oxygen ?Induction Type: IV induction ?Placement Confirmation: positive ETCO2 and CO2 detector ?Dental Injury: Teeth and Oropharynx as per pre-operative assessment  ? ? ? ? ?

## 2021-08-31 NOTE — Discharge Instructions (Signed)

## 2021-09-01 ENCOUNTER — Telehealth: Payer: Self-pay | Admitting: Cardiology

## 2021-09-01 ENCOUNTER — Encounter (HOSPITAL_COMMUNITY): Payer: Self-pay | Admitting: Cardiology

## 2021-09-01 NOTE — Telephone Encounter (Signed)
Pt calls to report constant pressure/ feeling as if a screw is pushing into chest since waking up this morning.  Discomfort is on the right.  Pain does not change with a deep breath.  Pt had a DCCV 08/31/21 post DCCV EKG NSR 1st degree AVB. Pt denies discomfort/ pain as feeling like a sunburn or skin irritation.  Pt reports can tell where DCCV pads were placed and does not feel pain is related.  Took Excedrin around 11 am with no change in chest discomfort. (Advised pt not to take Excedrin d/t NSAID pt reports is aware and NSAID not present)  BP 141/91-65.  Made DOD OV for 09/02/21 at 4:30 pm.  Advised pt will call back in the morning to ensure discomfort/ pain is not r/t to after effects of DCCV.  Pt advised to call after 9 am.   ?

## 2021-09-01 NOTE — Telephone Encounter (Signed)
Addendum: Advised pt if discomfort/ pain increases to report to the ED for evaluation.   ?

## 2021-09-01 NOTE — Progress Notes (Deleted)
Cardiology Office Note:    Date:  09/01/2021   ID:  Thomas [name], DOB 11/14/1950, MRN [id number]  PCP:  [name], MD  Cardiologist:  Freada Bergeron, MD   Referring MD: [name], *   No chief complaint on file.   History of Present Illness:    Thomas [name] is a 71 y.o. male with a hx of chronic systolic CHF/non-ischemic cardiomyopathy with EF of <20%, atrial flutter s/p DCCV in 01/2020 on Amiodarone and Eliquis, hypertension, history of osteomyelitis s/p multiple toe amputations, rheumatoid arthritis, prostate cancer, and anxiety/depression   Documentation leading to urgent office visit: "Pt calls to report constant pressure/ feeling as if a screw is pushing into chest since waking up this morning.  Discomfort is on the right.  Pain does not change with a deep breath.  Pt had a DCCV 08/31/21 post DCCV EKG NSR 1st degree AVB. Pt denies discomfort/ pain as feeling like a sunburn or skin irritation.  Pt reports can tell where DCCV pads were placed and does not feel pain is related.  Took Excedrin around 11 am with no change in chest discomfort. (Advised pt not to take Excedrin d/t NSAID pt reports is aware and NSAID not present)  BP 141/91-65.  Made DOD OV for 09/02/21 at 4:30 pm.  Advised pt will call back in the morning to ensure discomfort/ pain is not r/t to after effects of DCCV.  Pt advised to call after 9 am."  ***  Past Medical History:  Diagnosis Date   Atrial flutter ([place]) 01/2020   Cancer ([place])    prostate   Heart failure with reduced ejection fraction ([place])    Hypertension    Hypoglycemia    occ   NICM (nonischemic cardiomyopathy) ([place]) 02/12/2020   Prostate cancer ([place]) 2019   Rheumatoid arthritis [facility])     Past Surgical History:  Procedure Laterality Date   AMPUTATION TOE Right 12/2019   AMPUTATION TOE Right 03/26/2020   Procedure: Right 3rd toe partial amputation, bone biopsy right 1st metatarsal;  Surgeon: Evelina Bucy, DPM;   Location: [facility];  Service: Podiatry;  Laterality: Right;   BUBBLE STUDY  02/11/2020   Procedure: BUBBLE STUDY;  Surgeon: Geralynn Rile, MD;  Location: [place];  Service: Cardiovascular;;   CARDIOVERSION N/A 02/11/2020   Procedure: CARDIOVERSION;  Surgeon: Geralynn Rile, MD;  Location: [place];  Service: Cardiovascular;  Laterality: N/A;   CARDIOVERSION N/A 08/31/2021   Procedure: CARDIOVERSION;  Surgeon: Jerline Pain, MD;  Location: [facility];  Service: Cardiovascular;  Laterality: N/A;   CYSTOSCOPY  04/11/2018   Procedure: Erlene Quan;  Surgeon: [name], MD;  Location: [facility];  Service: Urology;;  NO SEEDS FOUND IN BLADDER   HERNIA REPAIR  2009   inguinal   INTRAMEDULLARY (IM) NAIL INTERTROCHANTERIC Right 05/05/2020   Procedure: CPT 27245-Cephalomedullary nailing of right intertrochanteric femur fracture;  Surgeon: Shona Needles, MD;  Location: [place];  Service: Orthopedics;  Laterality: Right;   INTRAMEDULLARY (IM) NAIL INTERTROCHANTERIC Right 12/24/2020   Procedure: REVISION FIXATION OF RIGHT INTERTROCHANTERIC FEMUR NONUNION;  Surgeon: Shona Needles, MD;  Location: [place];  Service: Orthopedics;  Laterality: Right;   IRRIGATION AND DEBRIDEMENT FOOT Left 03/26/2020   Procedure: Left foot incision and drainage with removal of all non-viable soft tissue and bone - areas overlying the 2nd and 5th metatarsals.;  Surgeon: Evelina Bucy, DPM;  Location: [facility];  Service: Podiatry;  Laterality: Left;   RADIOACTIVE SEED IMPLANT N/A 04/11/2018   Procedure: RADIOACTIVE SEED IMPLANT/BRACHYTHERAPY IMPLANT;  Surgeon: [name], MD;  Location: [facility];  Service: Urology;  Laterality: N/A;   29     SEEDS IMPLANTED   RIGHT/LEFT HEART CATH AND CORONARY ANGIOGRAPHY N/A 02/09/2020   Procedure: RIGHT/LEFT HEART CATH AND CORONARY ANGIOGRAPHY;  Surgeon: Nelva Bush, MD;  Location: [place] [facility];  Service:  Cardiovascular;  Laterality: N/A;   SPACE OAR INSTILLATION N/A 04/11/2018   Procedure: SPACE OAR INSTILLATION;  Surgeon: [name], MD;  [facility];  Service: Urology;  Laterality: N/A;   TEE WITHOUT CARDIOVERSION N/A 02/11/2020   Procedure: TRANSESOPHAGEAL ECHOCARDIOGRAM (TEE);  Surgeon: Geralynn Rile, MD;  Location: [place];  Service: Cardiovascular;  Laterality: N/A;   TOTAL KNEE ARTHROPLASTY Bilateral 09/13/2015   Procedure: BILATERAL KNEE ARTHROPLASTY ;  Surgeon: Paralee Cancel, MD;  Location: [facility];  Service: Orthopedics;  Laterality: Bilateral;    Current Medications: No outpatient medications have been marked as taking for the 09/02/21 encounter (Appointment) with Thomas Crome, MD.     Allergies:   Cefadroxil, Diltiazem hcl, Chlorthalidone, and Voltaren [diclofenac]   Social History   Socioeconomic History   Marital status: Married    Spouse name: Not on file   Number of children: Not on file   Years of education: Not on file   Highest education level: Not on file  Occupational History   Not on file  Tobacco Use   Smoking status: Never   Smokeless tobacco: Never  Vaping Use   Vaping Use: Never used  Substance and Sexual Activity   Alcohol use: Yes    Alcohol/week: 7.0 standard drinks    Types: 7 Standard drinks or equivalent per week   Drug use: Not Currently   Sexual activity: Yes  Other Topics Concern   Not on file  Social History Narrative   Not on file   Social Determinants of Health   Financial Resource Strain: Not on file  Food Insecurity: Not on file  Transportation Needs: Not on file  Physical Activity: Not on file  Stress: Not on file  Social Connections: Not on file     Family History: The patient's family history includes Prostate cancer in his brother and father. There is no history of Colon cancer, Rectal cancer, or Stomach cancer.  ROS:   Please see the history of present illness.    *** All  other systems reviewed and are negative.  EKGs/Labs/Other Studies Reviewed:    The following studies were reviewed today: ***  EKG:  EKG ***  Recent Labs: 05/22/2021: Magnesium 1.8 06/21/2021: ALT 12 08/26/2021: BUN 20; Creatinine, Ser 1.00; Hemoglobin 12.1; Platelets 337; Potassium 3.9; Sodium 140  Recent Lipid Panel    Component Value Date/Time   CHOL 111 02/04/2020 0452   TRIG 72 02/04/2020 0452   HDL 32 (L) 02/04/2020 0452   CHOLHDL 3.5 02/04/2020 0452   VLDL 14 02/04/2020 0452   LDLCALC 65 02/04/2020 0452   LDLDIRECT 94 12/06/2010 0917    Physical Exam:    VS:  There were no vitals taken for this visit.    Wt Readings from Last 3 Encounters:  08/31/21 220 lb 0.3 oz (99.8 kg)  08/02/21 220 lb (99.8 kg)  08/02/21 220 lb (99.8 kg)     GEN: ***. No acute distress HEENT: Normal NECK: No JVD. LYMPHATICS: No lymphadenopathy CARDIAC: *** murmur. RRR *** gallop, or  edema. VASCULAR: *** Normal Pulses. No bruits. RESPIRATORY:  Clear to auscultation without rales, wheezing or rhonchi  ABDOMEN: Soft, non-tender, non-distended, No pulsatile mass, MUSCULOSKELETAL: No deformity  SKIN: Warm and dry NEUROLOGIC:  Alert and oriented x 3 PSYCHIATRIC:  Normal affect   ASSESSMENT:    1. Chronic combined systolic and diastolic CHF, NYHA class 2 ([place])   2. Atrial flutter, unspecified type ([place])   3. Secondary hypercoagulable state ([place])    PLAN:    In order of problems listed above:  ***   Medication Adjustments/Labs and Tests Ordered: Current medicines are reviewed at length with the patient today.  Concerns regarding medicines are outlined above.  No orders of the defined types were placed in this encounter.  No orders of the defined types were placed in this encounter.   There are no Patient Instructions on file for this visit.   Signed, Sinclair Grooms, MD  09/01/2021 5:59 PM    [facility]

## 2021-09-01 NOTE — Telephone Encounter (Signed)
Pt c/o of Chest Pain: STAT if CP now or developed within 24 hours ? ?1. Are you having CP right now? Yes  ? ?2. Are you experiencing any other symptoms (ex. SOB, nausea, vomiting, sweating)? No  ? ?3. How long have you been experiencing CP? Started this morning when he woke up  ? ?4. Is your CP continuous or coming and going? Continuous  ? ?5. Have you taken Nitroglycerin? No  ??  ?Transferred to triage due to being STAT ?

## 2021-09-02 ENCOUNTER — Ambulatory Visit: Payer: Medicare Other | Admitting: Interventional Cardiology

## 2021-09-02 NOTE — Telephone Encounter (Signed)
Called pt reports chest discomfort is a lot better.  Would like to cancel OV for today.  Advised pt if discomfort gets worse he can call our office we have an after hours service or report to the ED for evaluation.  Pt verbalizes understanding.  No further questions or concerns.  ?

## 2021-09-05 ENCOUNTER — Other Ambulatory Visit: Payer: Self-pay | Admitting: Family Medicine

## 2021-09-08 NOTE — Telephone Encounter (Addendum)
   Name: Thomas [name]  DOB: 10/20/1950  MRN: [id number]  Primary Cardiologist: Freada Bergeron, MD  Chart reviewed as part of pre-operative protocol coverage. Because of Thomas [name]'s past medical history and time since last visit, he will require a follow-up in-office visit in order to better assess preoperative cardiovascular risk.  Since original message was routed on 5/3, the patient has undergone cardioversion on 5/10. Given recent cardioversion we are unable to safely hold blood thinners until 4 weeks after this procedure, and need a preop visit to finalize plans. Although he has an EP f/u planned, would also arrange an in-person general cardiology visit to address preop clearance in the upcoming weeks (OK to see APP). As long as patient is asymptomatic, would schedule this for 3-4 weeks after 5/10 date.  Pre-op covering staff: - Please schedule appointment and call patient to inform them.  - Please contact requesting surgeon's office via preferred method (i.e, phone, fax) to inform them of need for appointment prior to surgery.  Charlie Pitter, PA-C  09/08/2021, 8:34 AM

## 2021-09-08 NOTE — Telephone Encounter (Signed)
Pt is scheduled on 10/04/21 with ML for pre-op clearance

## 2021-09-20 NOTE — Progress Notes (Signed)
Cardiology Office Note    Date:  10/04/2021   ID:  Thomas [name], DOB 1950-10-21, MRN [id number]   PCP:  [name], MD   [place]  Cardiologist:  Freada Bergeron, MD   Advanced Practice Provider:  No care team member to display Electrophysiologist:  None   [phone]   Chief Complaint  Patient presents with   Pre-op Exam    History of Present Illness:  Thomas [name] is a 71 y.o. male with a hx of of chronic systolic CHF/non-ischemic cardiomyopathy with EF of <20%, atrial flutter s/p DCCV in 01/2020 on Amiodarone and Eliquis, hypertension, history of osteomyelitis s/p multiple toe amputations, rheumatoid arthritis, prostate cancer, and anxiety/depression who presents to clinic for follow-up.   He was admitted from 02/02/2020-02/12/2020 for atrial flutter with RVR and newly diagnosed acute systolic CHF with gross volume overload. Echo showed LVEF of <20% with global hypokinesis, biatrial enlargement, mild MR, and moderate bilateral pleural effusions. RV mildly enlarged but systolic function normal. Patient was diuresed with IV Lasix and then underwent right/left cardiac catheterization on 02/09/2020 which showed minimal CAD, mildly reduced Fick cardiac output/index, and upper normal left/heart pressures. He then underwent successful TEE/DCCV on 02/11/2020. Following DCCV, he became somnolent and hypotensive requiring pressors but rapidly improved and these were able to be weaned off quickly. He was discharged on Torsemide 2m twice daily, Amiodarone 204mtwice daily, and Eliquis 47m28mwice daily. Unfortunately BP limited our ability to add guideline directed medical therapy for CHF.  Patient saw Dr. PemJohney Frame11/23 and found to be in [place] reviewed from 04/2021 and he was in Afl[place]ited TTE LVEF 30-35%. He underwent DCCV 08/31/21  Preop clearance for Right Hip IM Nail Conversion to total hip arthroplasty  by DanCharlies ConstableMD. Patient says he doesn't know when he's in atrial flutter. Denies chest pain, dyspnea, dizziness, edema. 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 No exercise outside of his job but does some martial arts. Patient isn't really sure he wants to have the hip surgery.   Past Medical History:  Diagnosis Date   Atrial flutter ([place]CC[place]0/2021   [place]cer ([place]  prostate   Heart failure with reduced ejection fraction (HCC)    Hypertension    Hypoglycemia    occ   NICM (nonischemic cardiomyopathy) (HCCRoyal[place]0/21/2021   Prostate cancer ([place]CC[place]019   [facility]   Past Surgical History:  Procedure Laterality Date   AMPUTATION TOE Right 12/2019   AMPUTATION TOE Right 03/26/2020   Procedure: Right 3rd toe partial amputation, bone biopsy right 1st metatarsal;  Surgeon: PriEvelina BucyPM;  [facility];  Service: Podiatry;  Laterality: Right;   BUBBLE STUDY  02/11/2020   Procedure: BUBBLE STUDY;  Surgeon: O'NGeralynn RileD;  [facility];;   CARDIOVERSION N/A 02/11/2020   Procedure: CARDIOVERSION;  Surgeon: O'NGeralynn RileD;  [facility] [place];  Laterality: N/A;   CARDIOVERSION N/A 08/31/2021   Procedure: CARDIOVERSION;  Surgeon: SkaJerline PainD;  [facility];  Service: Cardiovascular;  Laterality: N/A;   CYSTOSCOPY  04/11/2018   Procedure: CYSErlene QuanSurgeon: HerArdis HughsD;  [facility];;  NO SEEDS FOUND IN BLADDER   HERNIA REPAIR  2009   inguinal   INTRAMEDULLARY (IM) NAIL INTERTROCHANTERIC Right 05/05/2020  Procedure: CPT 27245-Cephalomedullary nailing of right intertrochanteric femur fracture;  Surgeon: Shona Needles, MD;  Location: [place];  Service: Orthopedics;  Laterality: Right;   INTRAMEDULLARY (IM) NAIL INTERTROCHANTERIC Right 12/24/2020   Procedure: REVISION FIXATION OF  RIGHT INTERTROCHANTERIC FEMUR NONUNION;  Surgeon: Shona Needles, MD;  Location: [place];  Service: Orthopedics;  Laterality: Right;   IRRIGATION AND DEBRIDEMENT FOOT Left 03/26/2020   Procedure: Left foot incision and drainage with removal of all non-viable soft tissue and bone - areas overlying the 2nd and 5th metatarsals.;  Surgeon: Evelina Bucy, DPM;  [facility];  Service: Podiatry;  Laterality: Left;   RADIOACTIVE SEED IMPLANT N/A 04/11/2018   Procedure: RADIOACTIVE SEED IMPLANT/BRACHYTHERAPY IMPLANT;  Surgeon: [name], MD;  Location[facility];  Service: Urology;  Laterality: N/A;   46     SEEDS IMPLANTED   RIGHT/LEFT HEART CATH AND CORONARY ANGIOGRAPHY N/A 02/09/2020   Procedure: RIGHT/LEFT HEART CATH AND CORONARY ANGIOGRAPHY;  Surgeon: Nelva Bush, MD;  Location: [place] CV LAB;  Service: Cardiovascular;  Laterality: N/A;   SPACE OAR INSTILLATION N/A 04/11/2018   Procedure: SPACE OAR INSTILLATION;  Surgeon: [name], MD;  Location[facility];  Service: Urology;  Laterality: N/A;   TEE WITHOUT CARDIOVERSION N/A 02/11/2020   Procedure: TRANSESOPHAGEAL ECHOCARDIOGRAM (TEE);  Surgeon: Geralynn Rile, MD;  Location: [place];  Service: Cardiovascular;  Laterality: N/A;   TOTAL KNEE ARTHROPLASTY Bilateral 09/13/2015   Procedure: BILATERAL KNEE ARTHROPLASTY ;  Surgeon: Paralee Cancel, MD;  [facility];  Service: Orthopedics;  Laterality: Bilateral;    Current Medications: Current Meds  Medication Sig   acetaminophen (TYLENOL) 500 MG tablet Take 500 mg by mouth every 6 (six) hours as needed for moderate pain.   amiodarone (PACERONE) 200 MG tablet Take 1 tablet (200 mg total) by mouth daily.   apixaban (ELIQUIS) 5 MG TABS tablet Take 1 tablet (5 mg total) by mouth 2 (two) times daily.   diazepam (VALIUM) 5 MG tablet Take 1 tablet (5 mg total) by mouth at bedtime as needed.   DULoxetine (CYMBALTA) 60 MG  capsule TAKE 1 CAPSULE BY MOUTH EVERY DAY - PLS MAKE AN APPOINTMENT FOR AN OFFICE VISIT   folic acid (FOLVITE) 1 MG tablet Take 1 mg by mouth daily.   lisinopril (ZESTRIL) 10 MG tablet Take 1 tablet (10 mg total) by mouth daily.   methotrexate (RHEUMATREX) 2.5 MG tablet Take 15 mg by mouth every Wednesday. Caution:Chemotherapy. Protect from light.   metoprolol succinate (TOPROL XL) 25 MG 24 hr tablet Take 1 tablet (25 mg total) by mouth daily.   NARCAN 4 MG/0.1ML LIQD nasal spray kit Place 1 spray into the nose as needed (as directed for emergency).   oxycodone (ROXICODONE) 30 MG immediate release tablet Take 1 tablet (30 mg total) by mouth every 4 (four) hours as needed fo pain   predniSONE (DELTASONE) 5 MG tablet Take 5 mg by mouth daily with breakfast.   tadalafil (CIALIS) 20 MG tablet Take 1 tablet (20 mg total) by mouth daily as needed.   testosterone cypionate (DEPOTESTOSTERONE CYPIONATE) 200 MG/ML injection Inject 0.5 mLs into the muscle once a week.     Allergies:   Cefadroxil, Diltiazem hcl, Chlorthalidone, and Voltaren [diclofenac]   Social History   Socioeconomic History   Marital status: Married    Spouse name: Not on file   Number of children: Not on file   Years of education: Not on  file   Highest education level: Not on file  Occupational History   Not on file  Tobacco Use   Smoking status: Never   Smokeless tobacco: Never  Vaping Use   Vaping Use: Never used  Substance and Sexual Activity   Alcohol use: Yes    Alcohol/week: 7.0 standard drinks of alcohol    Types: 7 Standard drinks or equivalent per week   Drug use: Not Currently   Sexual activity: Yes  Other Topics Concern   Not on file  Social History Narrative   Not on file   Social Determinants of Health   Financial Resource Strain: Not on file  Food Insecurity: Not on file  Transportation Needs: Not on file  Physical Activity: Not on file  Stress: Not on file  Social Connections: Not on file      Family History:  The patient's  family history includes Prostate cancer in his brother and father.   ROS:   Please see the history of present illness.    ROS All other systems reviewed and are negative.   PHYSICAL EXAM:   VS:  BP 122/62   Pulse 66   Ht '6\' 8"'  (2.032 m)   Wt 222 lb 3.2 oz (100.8 kg)   SpO2 95%   BMI 24.41 kg/m   Physical Exam  GEN: Well nourished, well developed, in no acute distress  Neck: no JVD, carotid bruits, or masses Cardiac:RRR; no murmurs, rubs, or gallops  Respiratory:  clear to auscultation bilaterally, normal work of breathing GI: soft, nontender, nondistended, + BS Ext: without cyanosis, clubbing, or edema, Good distal pulses bilaterally Neuro:  Alert and Oriented x 3,  Psych: euthymic mood, full affect  Wt Readings from Last 3 Encounters:  10/04/21 222 lb 3.2 oz (100.8 kg)  09/27/21 223 lb (101.2 kg)  08/31/21 220 lb 0.3 oz (99.8 kg)      Studies/Labs Reviewed:   EKG:  EKG is not ordered today.     Recent Labs: 05/22/2021: Magnesium 1.8 06/21/2021: ALT 12 08/26/2021: BUN 20; Creatinine, Ser 1.00; Hemoglobin 12.1; Platelets 337; Potassium 3.9; Sodium 140   Lipid Panel    Component Value Date/Time   CHOL 111 02/04/2020 0452   TRIG 72 02/04/2020 0452   HDL 32 (L) 02/04/2020 0452   CHOLHDL 3.5 02/04/2020 0452   VLDL 14 02/04/2020 0452   LDLCALC 65 02/04/2020 0452   LDLDIRECT 94 12/06/2010 0917    Additional studies/ records that were reviewed today include:  Limited echo 08/18/21 IMPRESSIONS     1. Left ventricular ejection fraction, by estimation, is 30 to 35%. The  left ventricle has moderately decreased function. The left ventricle  demonstrates global hypokinesis. Left ventricular diastolic parameters are  indeterminate.   2. Right ventricular systolic function is mildly reduced. The right  ventricular size is normal. Tricuspid regurgitation signal is inadequate  for assessing PA pressure.   3. Left atrial size was  moderately dilated.   4. Right atrial size was mildly dilated.   5. Trivial mitral valve regurgitation. No evidence of mitral stenosis.   6. The aortic valve was not well visualized. Aortic valve regurgitation  is not visualized. No aortic stenosis is present.   7. The inferior vena cava is normal in size with greater than 50%  respiratory variability, suggesting right atrial pressure of 3 mmHg.   8. The patient was in atrial flutter.   FINDINGS   Left Ventricle: Left ventricular ejection fraction, by estimation, is 30  to  35%. The left ventricle has moderately decreased function. The left  ventricle demonstrates global hypokinesis. The left ventricular internal  cavity size was normal in size.  There is no left ventricular hypertrophy. Left ventricular diastolic  parameters are indeterminate.   Right Ventricle: The right ventricular size is normal. Right ventricular  systolic function is mildly reduced. Tricuspid regurgitation signal is  inadequate for assessing PA pressure.   Left Atrium: Left atrial size was moderately dilated.   Right Atrium: Right atrial size was mildly dilated.   Pericardium: There is no evidence of pericardial effusion.   Mitral Valve: Trivial mitral valve regurgitation. No evidence of mitral  valve stenosis.   Tricuspid Valve: The tricuspid valve is normal in structure. Tricuspid  valve regurgitation is trivial.   Aortic Valve: The aortic valve was not well visualized. Aortic valve  regurgitation is not visualized. No aortic stenosis is present.   Aorta: The aortic root is normal in size and structure.   Venous: The inferior vena cava is normal in size with greater than 50%  respiratory variability, suggesting right atrial pressure of 3 mmHg.   IAS/Shunts: No atrial level shunt detected by color flow Doppler.   Bilateral LE Arterial Doppler 06/03/2021: Summary:  Right: 30-49% stenosis noted in the anterior tibial artery. 30-49%  stenosis noted in  the posterior tibial artery.   Left: Cystic area with peripheral vascularization proximal to medial  malleolus (area of distal PTA) etiology unknown.    Echo 08/27/2020: IMPRESSIONS    1. Left ventricular ejection fraction, by estimation, is 55 to 60%. The  left ventricle has normal function. The left ventricle has no regional  wall motion abnormalities. There is mild left ventricular hypertrophy.  Left ventricular diastolic parameters  are consistent with Grade I diastolic dysfunction (impaired relaxation).   2. Right ventricular systolic function is normal. The right ventricular  size is normal. There is normal pulmonary artery systolic pressure.   3. Right atrial size was moderately dilated.   4. The mitral valve is grossly normal. Trivial mitral valve  regurgitation.   5. The aortic valve is tricuspid. Aortic valve regurgitation is trivial.  Mild aortic valve sclerosis is present, with no evidence of aortic valve  stenosis.   6. Aortic dilatation noted. There is mild dilatation of the aortic root,  measuring 41 mm.   7. The inferior vena cava is normal in size with greater than 50%  respiratory variability, suggesting right atrial pressure of 3 mmHg.   Echocardiogram 05/06/20: IMPRESSIONS   1. Left ventricular ejection fraction, by estimation, is 45 to 50%. The  left ventricle has mildly decreased function. The left ventricle  demonstrates global hypokinesis. Left ventricular diastolic parameters are  consistent with Grade I diastolic  dysfunction (impaired relaxation).   2. Right ventricular systolic function is normal. The right ventricular  size is normal. There is normal pulmonary artery systolic pressure. The  estimated right ventricular systolic pressure is 16.1 mmHg.   3. The mitral valve is normal in structure. No evidence of mitral valve  regurgitation.   4. The aortic valve was not well visualized. Aortic valve regurgitation  is not visualized. No aortic stenosis is  present.   5. The inferior vena cava is dilated in size with >50% respiratory  variability, suggesting right atrial pressure of 8 mmHg.    Echocardiogram 02/02/2020: Impression: 1. While there is no visualized LV thrombus on sweeps of the apex,  patient did not have IV access, unable to use echo contrast for  confirmation. Left ventricular ejection fraction, by estimation, is <20%.  The left ventricle has severely decreased  function. The left ventricle demonstrates global hypokinesis. The left  ventricular internal cavity size was moderately dilated. Left ventricular  diastolic function could not be evaluated.   2. Right ventricular systolic function is normal. The right ventricular  size is mildly enlarged. There is mildly elevated pulmonary artery  systolic pressure.   3. Left atrial size was severely dilated.   4. Right atrial size was severely dilated.   5. Moderate pleural effusion in both left and right lateral regions.   6. The mitral valve is normal in structure. Mild mitral valve  regurgitation.   7. The aortic valve is tricuspid. There is mild calcification of the  aortic valve. Aortic valve regurgitation is not visualized. Mild aortic  valve sclerosis is present, with no evidence of aortic valve stenosis.   8. The inferior vena cava is dilated in size with <50% respiratory  variability, suggesting right atrial pressure of 15 mmHg.   Conclusion(s)/Recommendation(s): Patient in atrial flutter with RVR during  study. Discussed severely reduced EF with Dr. Gasper Sells, cardiologist  on call.  _______________   Right/Left Cardiac Catheterization 02/09/2020: Conclusions: No angiographically significant coronary artery disease. Upper normal left heart, right heart, and pulmonary artery pressures. Mildly reduced Fick cardiac output/index.   Recommendations: Restart heparin infusion 2 hours after TR band removal.  If there is no evidence of bleeding/vascular complication,  consider transitioning back to apixaban as soon as tomorrow. Proceed with TEE/cardioversion as soon as tomorrow.  I will make Mr. Sporer NPO after midnight in anticipation of this. Optimize evidence-based heart failure therapy for non-ischemic cardiomyopathy. Primary prevention of coronary artery disease.   Diagnostic Dominance: Right        Risk Assessment/Calculations:    CHA2DS2-VASc Score = 3   This indicates a 3.2% annual risk of stroke. The patient's score is based upon: CHF History: 1 HTN History: 1 Diabetes History: 0 Stroke History: 0 Vascular Disease History: 0 Age Score: 1 Gender Score: 0        ASSESSMENT:    1. Preoperative clearance   2. Atypical atrial flutter (HCC)   3. NICM (nonischemic cardiomyopathy) ([place])   4. Essential hypertension   5. Hyperlipidemia, unspecified hyperlipidemia type      PLAN:  In order of problems listed above:  Preop clearance for Right Hip IM Nail Conversion to total hip arthroplasty by Charlies Constable, MD will need to hold eliquis for 2 days and pharmacy confirmed he can hold for 3 days. Patient just had cardioversion for Aflutter 08/31/21 and is feeling fine. No cardiac complaints. Nonobstructive CAD on cath 01/2020. Will get limited echo to see if EF has normalized but he can proceed with above surgery without further cardiac workup. 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  According to the Revised Cardiac Risk Index (RCRI), his Perioperative Risk of Major Cardiac Event is (%): 0.9  His Functional Capacity in METs is: 7.25 according to the Duke Activity Status Index (DASI).   Atrial flutter S/P DCCV 08/31/21-to see Dr. Lovena Le tomorrow for possible ablation.  NICM EF < 20% improved 55-60% on echo 08/27/20 with grade 1 DD, , limited echo 08/18/21  LVEF 30-35% in Aflutter,non obstructive disease on cath 01/2020. Order limited echo to see if EF has normalized  HTN BP stable  HLD-managed by PCP    Shared Decision Making/Informed  Consent        Medication Adjustments/Labs and Tests Ordered:  Current medicines are reviewed at length with the patient today.  Concerns regarding medicines are outlined above.  Medication changes, Labs and Tests ordered today are listed in the Patient Instructions below. Patient Instructions  Medication Instructions:  Your physician recommends that you continue on your current medications as directed. Please refer to the Current Medication list given to you today.  *If you need a refill on your cardiac medications before your next appointment, please call your pharmacy*   Lab Work: None If you have labs (blood work) drawn today and your tests are completely normal, you will receive your results only by: [facility] (if you have MyChart) OR A paper copy in the mail If you have any lab test that is abnormal or we need to change your treatment, we will call you to review the results.   Testing/Procedures: Your physician has requested that you have an echocardiogram. Echocardiography is a painless test that uses sound waves to create images of your heart. It provides your doctor with information about the size and shape of your heart and how well your heart's chambers and valves are working. This procedure takes approximately one hour. There are no restrictions for this procedure.   Follow-Up: At [facility], you and your health needs are our priority.  As part of our continuing mission to provide you with exceptional heart care, we have created designated Provider Care Teams.  These Care Teams include your primary Cardiologist (physician) and Advanced Practice Providers (APPs -  Physician Assistants and Nurse Practitioners) who all work together to provide you with the care you need, when you need it.  We recommend signing up for the patient portal called "MyChart".  Sign up information is provided on this After Visit Summary.  MyChart is used to connect with patients for Virtual  Visits (Telemedicine).  Patients are able to view lab/test results, encounter notes, upcoming appointments, etc.  Non-urgent messages can be sent to your provider as well.   To learn more about what you can do with MyChart, go to [email or web address].    Your next appointment:   6 month(s)  The format for your next appointment:   In Person  Provider:   Freada Bergeron, MD {     Signed, Ermalinda Barrios, PA-C  10/04/2021 [facility] [address], [place], [place]  [address] Phone: [phone]; Fax: [phone]

## 2021-09-26 ENCOUNTER — Other Ambulatory Visit: Payer: Self-pay | Admitting: *Deleted

## 2021-09-26 MED ORDER — LISINOPRIL 10 MG PO TABS: 10.0000 mg | ORAL_TABLET | Freq: Every day | ORAL | 3 refills | Status: DC

## 2021-09-27 ENCOUNTER — Other Ambulatory Visit: Payer: Self-pay

## 2021-09-27 ENCOUNTER — Ambulatory Visit (INDEPENDENT_AMBULATORY_CARE_PROVIDER_SITE_OTHER): Payer: Medicare Other | Admitting: Family Medicine

## 2021-09-27 DIAGNOSIS — M069 Rheumatoid arthritis, unspecified: Secondary | ICD-10-CM | POA: Diagnosis not present

## 2021-09-27 DIAGNOSIS — R195 Other fecal abnormalities: Secondary | ICD-10-CM | POA: Diagnosis not present

## 2021-09-27 DIAGNOSIS — L98499 Non-pressure chronic ulcer of skin of other sites with unspecified severity: Secondary | ICD-10-CM

## 2021-09-27 DIAGNOSIS — R634 Abnormal weight loss: Secondary | ICD-10-CM

## 2021-09-27 NOTE — Assessment & Plan Note (Signed)
Recommend he follow up with his rheumatologist to get of prednisone once he decides on surgery or not

## 2021-09-27 NOTE — Assessment & Plan Note (Signed)
On toe.  Non infected.  Recommend he stop HC cream and use just vaseline

## 2021-09-27 NOTE — Assessment & Plan Note (Signed)
Improving.  No signs of chronic infections

## 2021-09-27 NOTE — Assessment & Plan Note (Signed)
Again discussed he could have colon cancer and the need for follow up with GI.  He is not interested currently

## 2021-09-27 NOTE — Patient Instructions (Signed)
Good to see you today - Thank you for coming in  Things we discussed today:  Bring all your medication bottles when you see Dr Johney Frame.  You could have colon cancer - You need a colonoscopy to prevent colon cancer.  I have placed a referral to the gastroenterologist's office.  They should call you within two weeks.  If they do not please let me know   Stop the cortisone on the toe and use vaseline bid  Come back to see me in 12 months

## 2021-09-27 NOTE — Progress Notes (Signed)
    SUBJECTIVE:   CHIEF COMPLAINT / HPI:   Last seen more than a year ago.  Here for surgical clearance for possible revision of hip prosthesis.  He is unsure if he really wants to have surgery. Did not bring his medications and is unsure of names and doses  Weight loss - eating fairly well.  No longer losing weight  Foot Infections - has finished all podiatry surgery.  One small area on middle L toe that not completely healed.  No pain or discharge.  Using hydrocortisone on it  RA - off embrel. Taking prednisone daily and mtx.  Is following with Dr Leitha Bleak  A Flutter - recent cardioversion.  Has follow up with cardiology next week   PERTINENT  PMH / PSH: Osteomyelitis, Aflutter, Prostate Ca, RA, heme positive stool  OBJECTIVE:   BP (!) 144/81   Pulse 68   Wt 223 lb (101.2 kg)   SpO2 100%   BMI 24.50 kg/m   Heart - Regular rate and rhythm.  No murmurs, gallops or rubs.    Lungs:  Normal respiratory effort, chest expands symmetrically. Lungs are clear to auscultation, no crackles or wheezes. Extrem - L foot sp amputations.  His middle toe has small 1 cm defect without erythema or discharge   ASSESSMENT/PLAN:   Surgical Clearance - recommend he follow up with cardiology and to bring all his medications since he is unclear on these. Beside cardiac problems and chronic prednisone use he does not seem to have remediable risks for surgery   Skin ulcer ([place]) On toe.  Non infected.  Recommend he stop HC cream and use just vaseline  Rheumatoid arthritis ([place]) Recommend he follow up with his rheumatologist to get of prednisone once he decides on surgery or not  Weight loss, non-intentional Improving.  No signs of chronic infections   Heme positive stool Again discussed he could have colon cancer and the need for follow up with GI.  He is not interested currently      [name], [place]

## 2021-09-28 ENCOUNTER — Ambulatory Visit: Payer: Medicare Other | Admitting: Family Medicine

## 2021-10-04 ENCOUNTER — Ambulatory Visit: Payer: Medicare Other | Admitting: Physician Assistant

## 2021-10-04 ENCOUNTER — Ambulatory Visit: Payer: Medicare Other | Admitting: Podiatry

## 2021-10-04 ENCOUNTER — Encounter: Payer: Self-pay | Admitting: Physician Assistant

## 2021-10-04 VITALS — BP 122/62 | HR 66 | Ht >= 80 in | Wt 222.2 lb

## 2021-10-04 DIAGNOSIS — I428 Other cardiomyopathies: Secondary | ICD-10-CM

## 2021-10-04 DIAGNOSIS — I10 Essential (primary) hypertension: Secondary | ICD-10-CM | POA: Diagnosis not present

## 2021-10-04 DIAGNOSIS — I484 Atypical atrial flutter: Secondary | ICD-10-CM

## 2021-10-04 DIAGNOSIS — Z01818 Encounter for other preprocedural examination: Secondary | ICD-10-CM | POA: Diagnosis not present

## 2021-10-04 DIAGNOSIS — E785 Hyperlipidemia, unspecified: Secondary | ICD-10-CM

## 2021-10-04 NOTE — Patient Instructions (Signed)
Medication Instructions:  Your physician recommends that you continue on your current medications as directed. Please refer to the Current Medication list given to you today.  *If you need a refill on your cardiac medications before your next appointment, please call your pharmacy*   Lab Work: None If you have labs (blood work) drawn today and your tests are completely normal, you will receive your results only by: [place] (if you have MyChart) OR A paper copy in the mail If you have any lab test that is abnormal or we need to change your treatment, we will call you to review the results.   Testing/Procedures: Your physician has requested that you have an echocardiogram. Echocardiography is a painless test that uses sound waves to create images of your heart. It provides your doctor with information about the size and shape of your heart and how well your heart's chambers and valves are working. This procedure takes approximately one hour. There are no restrictions for this procedure.   Follow-Up: At [facility], you and your health needs are our priority.  As part of our continuing mission to provide you with exceptional heart care, we have created designated Provider Care Teams.  These Care Teams include your primary Cardiologist (physician) and Advanced Practice Providers (APPs -  Physician Assistants and Nurse Practitioners) who all work together to provide you with the care you need, when you need it.  We recommend signing up for the patient portal called "MyChart".  Sign up information is provided on this After Visit Summary.  MyChart is used to connect with patients for Virtual Visits (Telemedicine).  Patients are able to view lab/test results, encounter notes, upcoming appointments, etc.  Non-urgent messages can be sent to your provider as well.   To learn more about what you can do with MyChart, go to [email or web address].    Your next appointment:   6 month(s)  The  format for your next appointment:   In Person  Provider:   Freada Bergeron, MD {

## 2021-10-05 ENCOUNTER — Ambulatory Visit: Payer: Medicare Other | Admitting: Internal Medicine

## 2021-10-05 ENCOUNTER — Encounter: Payer: Self-pay | Admitting: Internal Medicine

## 2021-10-05 VITALS — BP 120/76 | HR 64 | Ht >= 80 in | Wt 225.6 lb

## 2021-10-05 DIAGNOSIS — I4892 Unspecified atrial flutter: Secondary | ICD-10-CM | POA: Diagnosis not present

## 2021-10-05 NOTE — Patient Instructions (Addendum)
Medication Instructions:  Your physician recommends that you continue on your current medications as directed. Please refer to the Current Medication list given to you today.  Labwork: None ordered.  Testing/Procedures: None ordered.  Follow-Up: Your physician wants you to follow-up in: 6 months with Cristopher Peru, MD.  The following dates are available for ablation:  July 6, 10, 14, 17, 18, 31.   Any Other Special Instructions Will Be Listed Below (If Applicable).  If you need a refill on your cardiac medications before your next appointment, please call your pharmacy.   Important Information About [place]

## 2021-10-05 NOTE — Progress Notes (Signed)
HPI Mr. Gripp is referred by Dr. Johney Frame for evaluation of atrial flutter. He is a pleasant 71 yo man with a h/o DCM, chronic systolic heart failure and recurrent atrial flutter. He was placed on amiodarone 200 mg daily almost 2 years ago after presenting with atrial flutter and undergoing DCCV. He had recurrent flutter a couple of weeks ago. He did not know that he was out of rhythm. He does not have palpitations. His ECG 2 months ago demonstrated atrial flutter with a variable VR. He has a h/o foot infections requiring amputation. He has RA and is on prednisone and MTX.  Allergies  Allergen Reactions   Cefadroxil Hives and Palpitations    EMS to ED from [facility].   Diltiazem Hcl Swelling   Chlorthalidone Other (See Comments)    "Makes me light-headed and I don't like the way it makes me feel"   Voltaren [Diclofenac] Rash     Current Outpatient Medications  Medication Sig Dispense Refill   acetaminophen (TYLENOL) 500 MG tablet Take 500 mg by mouth every 6 (six) hours as needed for moderate pain.     amiodarone (PACERONE) 200 MG tablet Take 1 tablet (200 mg total) by mouth daily. 90 tablet 3   apixaban (ELIQUIS) 5 MG TABS tablet Take 1 tablet (5 mg total) by mouth 2 (two) times daily. 60 tablet 0   diazepam (VALIUM) 5 MG tablet Take 1 tablet (5 mg total) by mouth at bedtime as needed. 30 tablet 1   DULoxetine (CYMBALTA) 60 MG capsule TAKE 1 CAPSULE BY MOUTH EVERY DAY - PLS MAKE AN APPOINTMENT FOR AN OFFICE VISIT 30 capsule 0   folic acid (FOLVITE) 1 MG tablet Take 1 mg by mouth daily.     lisinopril (ZESTRIL) 10 MG tablet Take 1 tablet (10 mg total) by mouth daily. 90 tablet 3   methotrexate (RHEUMATREX) 2.5 MG tablet Take 15 mg by mouth every Wednesday. Caution:Chemotherapy. Protect from light.     metoprolol succinate (TOPROL XL) 25 MG 24 hr tablet Take 1 tablet (25 mg total) by mouth daily. 90 tablet 3   NARCAN 4 MG/0.1ML LIQD nasal spray kit Place 1 spray into the nose as needed  (as directed for emergency). 2 each 0   oxycodone (ROXICODONE) 30 MG immediate release tablet Take 1 tablet (30 mg total) by mouth every 4 (four) hours as needed fo pain 42 tablet 0   predniSONE (DELTASONE) 5 MG tablet Take 5 mg by mouth daily with breakfast.     tadalafil (CIALIS) 20 MG tablet Take 1 tablet (20 mg total) by mouth daily as needed. 30 tablet 5   testosterone cypionate (DEPOTESTOSTERONE CYPIONATE) 200 MG/ML injection Inject 0.5 mLs into the muscle once a week.     No current facility-administered medications for this visit.     Past Medical History:  Diagnosis Date   Atrial flutter ([place]) 01/2020   Cancer ([place])    prostate   Heart failure with reduced ejection fraction (HCC)    Hypertension    Hypoglycemia    occ   NICM (nonischemic cardiomyopathy) ([place]) 02/12/2020   Prostate cancer ([place]) 2019   Rheumatoid arthritis ([place])     ROS:   All systems reviewed and negative except as noted in the HPI.   Past Surgical History:  Procedure Laterality Date   AMPUTATION TOE Right 12/2019   AMPUTATION TOE Right 03/26/2020   Procedure: Right 3rd toe partial amputation, bone biopsy right 1st metatarsal;  Surgeon:  Evelina Bucy, DPM;  Location: [facility];  Service: Podiatry;  Laterality: Right;   BUBBLE STUDY  02/11/2020   Procedure: BUBBLE STUDY;  Surgeon: Geralynn Rile, MD;  Location: [place];  Service: Cardiovascular;;   CARDIOVERSION N/A 02/11/2020   Procedure: CARDIOVERSION;  Surgeon: Geralynn Rile, MD;  Location: [place];  Service: Cardiovascular;  Laterality: N/A;   CARDIOVERSION N/A 08/31/2021   Procedure: CARDIOVERSION;  Surgeon: Jerline Pain, MD;  Location: [facility];  Service: Cardiovascular;  Laterality: N/A;   CYSTOSCOPY  04/11/2018   Procedure: Erlene Quan;  Surgeon: [name], MD;  Location: [facility];  Service: Urology;;  NO SEEDS FOUND IN BLADDER   HERNIA REPAIR  2009   inguinal   INTRAMEDULLARY  (IM) NAIL INTERTROCHANTERIC Right 05/05/2020   Procedure: CPT 27245-Cephalomedullary nailing of right intertrochanteric femur fracture;  Surgeon: Shona Needles, MD;  Location: [place];  Service: Orthopedics;  Laterality: Right;   INTRAMEDULLARY (IM) NAIL INTERTROCHANTERIC Right 12/24/2020   Procedure: REVISION FIXATION OF RIGHT INTERTROCHANTERIC FEMUR NONUNION;  Surgeon: Shona Needles, MD;  Location: [place];  Service: Orthopedics;  Laterality: Right;   IRRIGATION AND DEBRIDEMENT FOOT Left 03/26/2020   Procedure: Left foot incision and drainage with removal of all non-viable soft tissue and bone - areas overlying the 2nd and 5th metatarsals.;  Surgeon: Evelina Bucy, DPM;  Location: [facility];  Service: Podiatry;  Laterality: Left;   RADIOACTIVE SEED IMPLANT N/A 04/11/2018   Procedure: RADIOACTIVE SEED IMPLANT/BRACHYTHERAPY IMPLANT;  Surgeon: [name], MD;  Location[facility];  Service: Urology;  Laterality: N/A;   70     SEEDS IMPLANTED   RIGHT/LEFT HEART CATH AND CORONARY ANGIOGRAPHY N/A 02/09/2020   Procedure: RIGHT/LEFT HEART CATH AND CORONARY ANGIOGRAPHY;  Surgeon: Nelva Bush, MD;  Location: [facility];  Service: Cardiovascular;  Laterality: N/A;   SPACE OAR INSTILLATION N/A 04/11/2018   Procedure: SPACE OAR INSTILLATION;  Surgeon: [name], MD;  Location: [facility];  Service: Urology;  Laterality: N/A;   TEE WITHOUT CARDIOVERSION N/A 02/11/2020   Procedure: TRANSESOPHAGEAL ECHOCARDIOGRAM (TEE);  Surgeon: Geralynn Rile, MD;  Location: [place];  Service: Cardiovascular;  Laterality: N/A;   TOTAL KNEE ARTHROPLASTY Bilateral 09/13/2015   Procedure: BILATERAL KNEE ARTHROPLASTY ;  Surgeon: Paralee Cancel, MD;  Location: [facility];  Service: Orthopedics;  Laterality: Bilateral;     Family History  Problem Relation Age of Onset   Prostate cancer Father    Prostate cancer Brother    Colon cancer Neg Hx    Rectal cancer  Neg Hx    Stomach cancer Neg Hx      Social History   Socioeconomic History   Marital status: Married    Spouse name: Not on file   Number of children: Not on file   Years of education: Not on file   Highest education level: Not on file  Occupational History   Not on file  Tobacco Use   Smoking status: Never   Smokeless tobacco: Never  Vaping Use   Vaping Use: Never used  Substance and Sexual Activity   Alcohol use: Yes    Alcohol/week: 7.0 standard drinks of alcohol    Types: 7 Standard drinks or equivalent per week   Drug use: Not Currently   Sexual activity: Yes  Other Topics Concern   Not on file  Social History Narrative   Not on file   Social Determinants of  Health   Financial Resource Strain: Not on file  Food Insecurity: Not on file  Transportation Needs: Not on file  Physical Activity: Not on file  Stress: Not on file  Social Connections: Not on file  Intimate Partner Violence: Not on file     BP 120/76   Pulse 64   Ht _0  (2.032 m)   Wt 225 lb 9.6 oz (102.3 kg)   SpO2 95%   BMI 24.78 kg/m   Physical Exam:  Well appearing 71 yo man, NAD HEENT: Unremarkable Neck:  No JVD, no thyromegally Lymphatics:  No adenopathy Back:  No CVA tenderness Lungs:  Clear with no wheezes HEART:  Regular rate rhythm, no murmurs, no rubs, no clicks Abd:  soft, positive bowel sounds, no organomegally, no rebound, no guarding Ext:  2 plus pulses, no edema, no cyanosis, no clubbing Skin:  No rashes no nodules Neuro:  CN II through XII intact, motor grossly intact  EKG - nsr  Assess/Plan:  Recurrent atrial flutter - I have offered him catheter ablation vs watchful waiting though I recommended ablation. He will call us if he wishes to proceed. Chronic systolic heart failure - he has class 2A symptoms. He has had improvement in his EF. He will continue GDMT. Hopefully his EF will continue to improve, particularly if we can maintain NSR.  Coags - after his surgery,  he will need to restart his systemic anti-coagulation.  Carleene Overlie Anasofia Micallef,MD

## 2021-10-09 ENCOUNTER — Other Ambulatory Visit: Payer: Self-pay | Admitting: Family Medicine

## 2021-10-11 ENCOUNTER — Ambulatory Visit (INDEPENDENT_AMBULATORY_CARE_PROVIDER_SITE_OTHER): Payer: Medicare Other

## 2021-10-11 ENCOUNTER — Ambulatory Visit: Payer: Medicare Other | Admitting: Podiatry

## 2021-10-11 DIAGNOSIS — L539 Erythematous condition, unspecified: Secondary | ICD-10-CM

## 2021-10-11 DIAGNOSIS — L97512 Non-pressure chronic ulcer of other part of right foot with fat layer exposed: Secondary | ICD-10-CM | POA: Diagnosis not present

## 2021-10-11 MED ORDER — DOXYCYCLINE HYCLATE 100 MG PO TABS: 100.0000 mg | ORAL_TABLET | Freq: Two times a day (BID) | ORAL | 0 refills | Status: DC

## 2021-10-11 NOTE — Progress Notes (Signed)
Subjective:  Patient ID: Thomas [name], male    DOB: [birth year],  MRN: [id number]  No chief complaint on file.   71 y.o. male presents for wound care. Patient presents with complaint of right third digit ulceration with fat layer exposed.  Patient states that this came out of nowhere is progressive gotten worse.  He still works a lot on his foot.  He has not seen anyone else prior to seeing me.  Hurts with ambulation.  He has not been taking antibiotics.  There is mild redness around it.  Does not probe down to deep tissue.  Does not have purulent drainage.   Review of Systems: Negative except as noted in the HPI. Denies N/V/F/Ch.  Past Medical History:  Diagnosis Date   Atrial flutter ([place]) 01/2020   Cancer ([place])    prostate   Heart failure with reduced ejection fraction (HCC)    Hypertension    Hypoglycemia    occ   NICM (nonischemic cardiomyopathy) ([place]) 02/12/2020   Prostate cancer ([place]) 2019   Rheumatoid arthritis ([place])     Current Outpatient Medications:    doxycycline (VIBRA-TABS) 100 MG tablet, Take 1 tablet (100 mg total) by mouth 2 (two) times daily., Disp: 28 tablet, Rfl: 0   acetaminophen (TYLENOL) 500 MG tablet, Take 500 mg by mouth every 6 (six) hours as needed for moderate pain., Disp: , Rfl:    amiodarone (PACERONE) 200 MG tablet, Take 1 tablet (200 mg total) by mouth daily., Disp: 90 tablet, Rfl: 3   apixaban (ELIQUIS) 5 MG TABS tablet, Take 1 tablet (5 mg total) by mouth 2 (two) times daily., Disp: 60 tablet, Rfl: 0   diazepam (VALIUM) 5 MG tablet, Take 1 tablet (5 mg total) by mouth at bedtime as needed., Disp: 30 tablet, Rfl: 1   DULoxetine (CYMBALTA) 60 MG capsule, TAKE 1 CAPSULE BY MOUTH EVERY DAY -, Disp: 90 capsule, Rfl: 0   folic acid (FOLVITE) 1 MG tablet, Take 1 mg by mouth daily., Disp: , Rfl:    lisinopril (ZESTRIL) 10 MG tablet, Take 1 tablet (10 mg total) by mouth daily., Disp: 90 tablet, Rfl: 3   methotrexate (RHEUMATREX) 2.5 MG tablet, Take 15 mg  by mouth every Wednesday. Caution:Chemotherapy. Protect from light., Disp: , Rfl:    metoprolol succinate (TOPROL XL) 25 MG 24 hr tablet, Take 1 tablet (25 mg total) by mouth daily., Disp: 90 tablet, Rfl: 3   NARCAN 4 MG/0.1ML LIQD nasal spray kit, Place 1 spray into the nose as needed (as directed for emergency)., Disp: 2 each, Rfl: 0   oxycodone (ROXICODONE) 30 MG immediate release tablet, Take 1 tablet (30 mg total) by mouth every 4 (four) hours as needed fo pain, Disp: 42 tablet, Rfl: 0   predniSONE (DELTASONE) 5 MG tablet, Take 5 mg by mouth daily with breakfast., Disp: , Rfl:    tadalafil (CIALIS) 20 MG tablet, Take 1 tablet (20 mg total) by mouth daily as needed., Disp: 30 tablet, Rfl: 5   testosterone cypionate (DEPOTESTOSTERONE CYPIONATE) 200 MG/ML injection, Inject 0.5 mLs into the muscle once a week., Disp: , Rfl:   Social History   Tobacco Use  Smoking Status Never  Smokeless Tobacco Never    Allergies  Allergen Reactions   Cefadroxil Hives and Palpitations    EMS to ED from UC.   Diltiazem Hcl Swelling   Chlorthalidone Other (See Comments)    "Makes me light-headed and I don't like the way it makes me feel"  Voltaren [Diclofenac] Rash   Objective:  There were no vitals filed for this visit. There is no height or weight on file to calculate BMI. Constitutional Well developed. Well nourished.  Vascular Dorsalis pedis pulses palpable bilaterally. Posterior tibial pulses palpable bilaterally. Capillary refill normal to all digits.  No cyanosis or clubbing noted. Pedal hair growth normal.  Neurologic Normal speech. Oriented to person, place, and time. Protective sensation absent  Dermatologic Wound Location: Right third digit with fat layer exposed Wound Base: Mixed Granular/Fibrotic Peri-wound: Calloused Exudate: Scant/small amount Serosanguinous exudate Wound Measurements: -See below  Orthopedic: No pain to palpation either foot.   Radiographs: 3 views of  skeletally mature adult right third toe: No osteomyelitic changes noted.  No bony destruction noted. Assessment:   1. Erythema   2. Skin ulcer of third toe of right foot with fat layer exposed ([place])    Plan:  Patient was evaluated and treated and all questions answered.  Ulcer right third digit ulceration with fat layer exposed -Debridement as below. -Dressed with Betadine wet-to-dry, DSD. -Continue off-loading with surgical shoe. -Doxycycline was dispensed for skin and soft tissue prophylaxis -Patient is a high risk of losing that digit.  If there is no improvement over the next few weeks we will plan on doing surgical amputation of the toe partially. -I discussed the importance of offloading and wearing surgical shoe.  He states he will try to do his best.   Procedure: Excisional Debridement of Wound Tool: Sharp chisel blade/tissue nipper Rationale: Removal of non-viable soft tissue from the wound to promote healing.  Anesthesia: none Pre-Debridement Wound Measurements: 0.5 cm x 0.5 cm x 0.3 cm  Post-Debridement Wound Measurements: 0.6 cm x 0.6 cm x 0.3 cm  Type of Debridement: Sharp Excisional Tissue Removed: Non-viable soft tissue Blood loss: Minimal (<50cc) Depth of Debridement: subcutaneous tissue. Technique: Sharp excisional debridement to bleeding, viable wound base.  Wound Progress: This is my initial evaluation I will continue to monitor the progression of the wound Site healing conversation 7 Dressing: Dry, sterile, compression dressing. Disposition: Patient tolerated procedure well. Patient to return in 1 week for follow-up.  No follow-ups on file.

## 2021-10-20 ENCOUNTER — Ambulatory Visit (HOSPITAL_COMMUNITY): Payer: Medicare Other

## 2021-10-27 ENCOUNTER — Ambulatory Visit: Payer: Medicare Other | Admitting: Podiatry

## 2021-10-31 DIAGNOSIS — S72141D Displaced intertrochanteric fracture of right femur, subsequent encounter for closed fracture with routine healing: Secondary | ICD-10-CM | POA: Diagnosis not present

## 2021-11-01 ENCOUNTER — Telehealth: Payer: Self-pay | Admitting: *Deleted

## 2021-11-01 NOTE — Telephone Encounter (Signed)
   Pre-operative Risk Assessment    Patient Name: Thomas [name]  DOB: 1950/10/30 MRN: [id number]      Request for Surgical Clearance    Procedure:   R HIP CONVERSION TO TOTAL HIP ARTHROPLASTY   Date of Surgery:  Clearance TBD                                 Surgeon:  Charlies Constable, MD Surgeon's Group or Practice Name:  [facility] Phone number:  [phone] EXT: [phone] Fax number:  [phone] ATTN:  KELLY HIGH    Type of Clearance Requested:   - Pharmacy:  Hold Apixaban (Eliquis)   NOT INDICATED   Type of Anesthesia:  Spinal   Additional requests/questions:    Astrid Divine   11/01/2021, 8:10 AM

## 2021-11-01 NOTE — Telephone Encounter (Signed)
Patient with diagnosis of afib on Eliquis for anticoagulation.    Procedure: R hip conversion to THA Date of procedure: TBD  CHA2DS2-VASc Score = 4  This indicates a 4.8% annual risk of stroke. The patient's score is based upon: CHF History: 1 HTN History: 1 Diabetes History: 0 Stroke History: 0 Vascular Disease History: 1 Age Score: 1 Gender Score: 0  CrCl 140m/min Platelet count 337K  Underwent DCCV 08/31/21.  Per office protocol, patient can hold Eliquis for 3 days prior to procedure.    **This guidance is not considered finalized until pre-operative APP has relayed final recommendations.**

## 2021-11-02 NOTE — Telephone Encounter (Signed)
   Primary Cardiologist: Freada Bergeron, MD  Chart reviewed as part of pre-operative protocol coverage.  Thomas [name] was seen by Thomas Barrios, PA for cardiac evaluation for upcoming surgery on 10/04/21. He was felt to be at acceptable risk for upcoming surgery with RCRI for MACE of 0.9%. His functional capacity in METs is 7.25 according to the Duke Activity Status Index (DASI).   Per office protocol, patient can hold Eliquis for 3 days prior to procedure.  Patient was advised that if he develops new symptoms prior to surgery to contact our office to arrange a follow-up appointment.  He verbalized understanding.  I will route this recommendation to the requesting party via Epic fax function and remove from pre-op pool.  Please call with questions.  Emmaline Life, NP-C    11/02/2021, 8:14 AM [place] [address] Office [phone] Fax [phone]

## 2021-11-04 ENCOUNTER — Ambulatory Visit (HOSPITAL_COMMUNITY): Payer: Medicare Other

## 2021-11-08 DIAGNOSIS — N5201 Erectile dysfunction due to arterial insufficiency: Secondary | ICD-10-CM | POA: Diagnosis not present

## 2021-11-08 DIAGNOSIS — E291 Testicular hypofunction: Secondary | ICD-10-CM | POA: Diagnosis not present

## 2021-11-08 DIAGNOSIS — Z8546 Personal history of malignant neoplasm of prostate: Secondary | ICD-10-CM | POA: Diagnosis not present

## 2021-11-10 ENCOUNTER — Ambulatory Visit (HOSPITAL_COMMUNITY): Payer: Medicare Other | Attending: Physician Assistant

## 2021-11-10 DIAGNOSIS — I428 Other cardiomyopathies: Secondary | ICD-10-CM | POA: Insufficient documentation

## 2021-11-10 DIAGNOSIS — I484 Atypical atrial flutter: Secondary | ICD-10-CM | POA: Insufficient documentation

## 2021-11-10 DIAGNOSIS — Z01818 Encounter for other preprocedural examination: Secondary | ICD-10-CM | POA: Diagnosis not present

## 2021-11-10 DIAGNOSIS — M25551 Pain in right hip: Secondary | ICD-10-CM | POA: Diagnosis not present

## 2021-11-10 LAB — ECHOCARDIOGRAM LIMITED
Area-P 1/2: 2.91 cm2
S' Lateral: 2.9 cm

## 2021-11-15 ENCOUNTER — Telehealth: Payer: Self-pay | Admitting: Urology

## 2021-11-15 ENCOUNTER — Ambulatory Visit: Payer: Medicare Other | Admitting: Podiatry

## 2021-11-15 DIAGNOSIS — M86171 Other acute osteomyelitis, right ankle and foot: Secondary | ICD-10-CM

## 2021-11-15 DIAGNOSIS — Z01818 Encounter for other preprocedural examination: Secondary | ICD-10-CM

## 2021-11-15 NOTE — Progress Notes (Signed)
Subjective:  Patient ID: Thomas [name], male    DOB: 28-Jan-1951,  MRN: [id number]  Chief Complaint  Patient presents with   Wound Check    71 y.o. male presents for wound care. Patient presents with complaint of fourth digit ulceration with fat layer exposed.  Patient states doing okay seems to be getting a little bit better he has been doing Betadine wet-to-dry dressing change.  He is awaiting hip replacement   Review of Systems: Negative except as noted in the HPI. Denies N/V/F/Ch.  Past Medical History:  Diagnosis Date   Atrial flutter ([place]) 01/2020   Cancer ([place])    prostate   Heart failure with reduced ejection fraction (HCC)    Hypertension    Hypoglycemia    occ   NICM (nonischemic cardiomyopathy) ([place]) 02/12/2020   Prostate cancer ([place]) 2019   Rheumatoid arthritis ([place])     Current Outpatient Medications:    acetaminophen (TYLENOL) 500 MG tablet, Take 500 mg by mouth every 6 (six) hours as needed for moderate pain., Disp: , Rfl:    amiodarone (PACERONE) 200 MG tablet, Take 1 tablet (200 mg total) by mouth daily., Disp: 90 tablet, Rfl: 3   apixaban (ELIQUIS) 5 MG TABS tablet, Take 1 tablet (5 mg total) by mouth 2 (two) times daily., Disp: 60 tablet, Rfl: 0   diazepam (VALIUM) 5 MG tablet, Take 1 tablet (5 mg total) by mouth at bedtime as needed., Disp: 30 tablet, Rfl: 1   doxycycline (VIBRA-TABS) 100 MG tablet, Take 1 tablet (100 mg total) by mouth 2 (two) times daily., Disp: 28 tablet, Rfl: 0   DULoxetine (CYMBALTA) 60 MG capsule, TAKE 1 CAPSULE BY MOUTH EVERY DAY -, Disp: 90 capsule, Rfl: 0   folic acid (FOLVITE) 1 MG tablet, Take 1 mg by mouth daily., Disp: , Rfl:    lisinopril (ZESTRIL) 10 MG tablet, Take 1 tablet (10 mg total) by mouth daily., Disp: 90 tablet, Rfl: 3   methotrexate (RHEUMATREX) 2.5 MG tablet, Take 15 mg by mouth every Wednesday. Caution:Chemotherapy. Protect from light., Disp: , Rfl:    metoprolol succinate (TOPROL XL) 25 MG 24 hr tablet, Take 1  tablet (25 mg total) by mouth daily., Disp: 90 tablet, Rfl: 3   NARCAN 4 MG/0.1ML LIQD nasal spray kit, Place 1 spray into the nose as needed (as directed for emergency)., Disp: 2 each, Rfl: 0   oxycodone (ROXICODONE) 30 MG immediate release tablet, Take 1 tablet (30 mg total) by mouth every 4 (four) hours as needed fo pain, Disp: 42 tablet, Rfl: 0   predniSONE (DELTASONE) 5 MG tablet, Take 5 mg by mouth daily with breakfast., Disp: , Rfl:    tadalafil (CIALIS) 20 MG tablet, Take 1 tablet (20 mg total) by mouth daily as needed., Disp: 30 tablet, Rfl: 5   testosterone cypionate (DEPOTESTOSTERONE CYPIONATE) 200 MG/ML injection, Inject 0.5 mLs into the muscle once a week., Disp: , Rfl:   Social History   Tobacco Use  Smoking Status Never  Smokeless Tobacco Never    Allergies  Allergen Reactions   Cefadroxil Hives and Palpitations    EMS to ED from UC.   Diltiazem Hcl Swelling   Chlorthalidone Other (See Comments)    "Makes me light-headed and I don't like the way it makes me feel"   Voltaren [Diclofenac] Rash   Objective:  There were no vitals filed for this visit. There is no height or weight on file to calculate BMI. Constitutional Well developed. Well nourished.  Vascular Dorsalis pedis pulses palpable bilaterally. Posterior tibial pulses palpable bilaterally. Capillary refill normal to all digits.  No cyanosis or clubbing noted. Pedal hair growth normal.  Neurologic Normal speech. Oriented to person, place, and time. Protective sensation absent  Dermatologic Wound Location: Right fourth digit with fat layer exposed.  Probing down to bone no purulent drainage noted no malodor present no redness noted Wound Base: Mixed Granular/Fibrotic Peri-wound: Calloused Exudate: Scant/small amount Serosanguinous exudate Wound Measurements: -See below  Orthopedic: No pain to palpation either foot.   Radiographs: 3 views of skeletally mature adult right third toe: No osteomyelitic  changes noted.  No bony destruction noted. Assessment:   1. Acute osteomyelitis of toe, right ([place])   2. Encounter for preoperative examination for general surgical procedure     Plan:  Patient was evaluated and treated and all questions answered.  Ulcer rightFourthdigit ulceration with fat layer exposed -Clinically the wound is about the same and due to the adductovarus and plantarflexion of the digit is putting excessive stress and is on able to heal completely.  At this time I discussed that patient would benefit from partial amputation of the fourth toe.  He is awaiting hip replacement because of the wound.  He agrees with the plan like to proceed with elective partial fourth digit amputation.  At this time it is not clinically infected however there is a wound that probing down to bone. -He will benefit from right partial fourth digit amputation.  I discussed my preoperative intra postop plan in extensive detail he states understand like to proceed with surgery -Informed surgical risk consent was reviewed and read aloud to the patient.  I reviewed the films.  I have discussed my findings with the patient in great detail.  I have discussed all risks including but not limited to infection, stiffness, scarring, limp, disability, deformity, damage to blood vessels and nerves, numbness, poor healing, need for braces, arthritis, chronic pain, amputation, death.  All benefits and realistic expectations discussed in great detail.  I have made no promises as to the outcome.  I have provided realistic expectations.  I have offered the patient a 2nd opinion, which they have declined and assured me they preferred to proceed despite the risks   No follow-ups on file.

## 2021-11-15 NOTE — Telephone Encounter (Signed)
DOS - 11/21/21  AMPUTATION 4TH RIGHT --- 85992  [employer]BS EFFECTIVE DATE - 04/24/21  PLAN DEDUCTIBLE - $0.00 OUT OF POCKET - $3,950.00 W/ $3,164.18 REMAINING COINSURANCE - $3,950.00 W/ $3,164.18 REMAINING COPAY - $275.00   NO PRIOR AUTH IS REQUIRED

## 2021-11-21 ENCOUNTER — Encounter: Payer: Self-pay | Admitting: Podiatry

## 2021-11-21 ENCOUNTER — Other Ambulatory Visit: Payer: Self-pay | Admitting: Podiatry

## 2021-11-21 DIAGNOSIS — M24574 Contracture, right foot: Secondary | ICD-10-CM | POA: Diagnosis not present

## 2021-11-21 DIAGNOSIS — M86671 Other chronic osteomyelitis, right ankle and foot: Secondary | ICD-10-CM | POA: Diagnosis not present

## 2021-11-21 MED ORDER — OXYCODONE-ACETAMINOPHEN 5-325 MG PO TABS: 1.0000 | ORAL_TABLET | ORAL | 0 refills | Status: DC | PRN

## 2021-11-23 ENCOUNTER — Telehealth: Payer: Self-pay | Admitting: *Deleted

## 2021-11-23 NOTE — Telephone Encounter (Signed)
Thomas [name] w/ [facility] is calling to make physician aware that patient is currently taking oxycodone -30 mg tablets 6 q 4 hours, recently refilled on 07/28, does he want to continue with prescription sent for the oxycodone- ace 5/325 mg? Please advise.

## 2021-11-23 NOTE — Telephone Encounter (Signed)
Called [facility],spoke with Adonis Huguenin giving the message per physician, will deactivate the prescription.

## 2021-11-25 ENCOUNTER — Telehealth: Payer: Self-pay | Admitting: *Deleted

## 2021-11-25 NOTE — Telephone Encounter (Signed)
Patient is calling because his dressing has an odor,holes wearing at the end. Please advise.

## 2021-11-26 ENCOUNTER — Ambulatory Visit (INDEPENDENT_AMBULATORY_CARE_PROVIDER_SITE_OTHER): Payer: Medicare Other | Admitting: Podiatry

## 2021-11-26 DIAGNOSIS — Z89421 Acquired absence of other right toe(s): Secondary | ICD-10-CM | POA: Diagnosis not present

## 2021-11-26 NOTE — Progress Notes (Signed)
Subjective:  Patient ID: Thomas [name], male    DOB: 1951/03/09,  MRN: [id number]  Chief Complaint  Patient presents with   Routine Post Op    DOS: 11/21/2021 Procedure: Right third and fourth digit amputation partial  71 y.o. male returns for post-op check.  Patient states he is doing okay.  No acute complaints he has not seen anyone else prior to seeing me.  Review of Systems: Negative except as noted in the HPI. Denies N/V/F/Ch.  Past Medical History:  Diagnosis Date   Atrial flutter ([place]) 01/2020   Cancer ([place])    prostate   Heart failure with reduced ejection fraction (HCC)    Hypertension    Hypoglycemia    occ   NICM (nonischemic cardiomyopathy) ([place]) 02/12/2020   Prostate cancer ([place]) 2019   Rheumatoid arthritis ([place])     Current Outpatient Medications:    acetaminophen (TYLENOL) 500 MG tablet, Take 500 mg by mouth every 6 (six) hours as needed for moderate pain., Disp: , Rfl:    amiodarone (PACERONE) 200 MG tablet, Take 1 tablet (200 mg total) by mouth daily., Disp: 90 tablet, Rfl: 3   apixaban (ELIQUIS) 5 MG TABS tablet, Take 1 tablet (5 mg total) by mouth 2 (two) times daily., Disp: 60 tablet, Rfl: 0   diazepam (VALIUM) 5 MG tablet, Take 1 tablet (5 mg total) by mouth at bedtime as needed., Disp: 30 tablet, Rfl: 1   doxycycline (VIBRA-TABS) 100 MG tablet, Take 1 tablet (100 mg total) by mouth 2 (two) times daily., Disp: 28 tablet, Rfl: 0   DULoxetine (CYMBALTA) 60 MG capsule, TAKE 1 CAPSULE BY MOUTH EVERY DAY -, Disp: 90 capsule, Rfl: 0   folic acid (FOLVITE) 1 MG tablet, Take 1 mg by mouth daily., Disp: , Rfl:    lisinopril (ZESTRIL) 10 MG tablet, Take 1 tablet (10 mg total) by mouth daily., Disp: 90 tablet, Rfl: 3   methotrexate (RHEUMATREX) 2.5 MG tablet, Take 15 mg by mouth every Wednesday. Caution:Chemotherapy. Protect from light., Disp: , Rfl:    metoprolol succinate (TOPROL XL) 25 MG 24 hr tablet, Take 1 tablet (25 mg total) by mouth daily., Disp: 90 tablet,  Rfl: 3   NARCAN 4 MG/0.1ML LIQD nasal spray kit, Place 1 spray into the nose as needed (as directed for emergency)., Disp: 2 each, Rfl: 0   oxycodone (ROXICODONE) 30 MG immediate release tablet, Take 1 tablet (30 mg total) by mouth every 4 (four) hours as needed fo pain, Disp: 42 tablet, Rfl: 0   oxyCODONE-acetaminophen (PERCOCET) 5-325 MG tablet, Take 1 tablet by mouth every 4 (four) hours as needed for severe pain., Disp: 30 tablet, Rfl: 0   predniSONE (DELTASONE) 5 MG tablet, Take 5 mg by mouth daily with breakfast., Disp: , Rfl:    tadalafil (CIALIS) 20 MG tablet, Take 1 tablet (20 mg total) by mouth daily as needed., Disp: 30 tablet, Rfl: 5   testosterone cypionate (DEPOTESTOSTERONE CYPIONATE) 200 MG/ML injection, Inject 0.5 mLs into the muscle once a week., Disp: , Rfl:   Social History   Tobacco Use  Smoking Status Never  Smokeless Tobacco Never    Allergies  Allergen Reactions   Cefadroxil Hives and Palpitations    [facility] ED from [facility].   Diltiazem Hcl Swelling   Chlorthalidone Other (See Comments)    "Makes me light-headed and I don't like the way it makes me feel"   Voltaren [Diclofenac] Rash   Objective:  There were no vitals filed  for this visit. There is no height or weight on file to calculate BMI. Constitutional Well developed. Well nourished.  Vascular Foot warm and well perfused. Capillary refill normal to all digits.   Neurologic Normal speech. Oriented to person, place, and time. Epicritic sensation to light touch grossly present bilaterally.  Dermatologic Skin healing well without signs of infection. Skin edges well coapted without signs of infection.  Orthopedic: Tenderness to palpation noted about the surgical site.   Radiographs: None Assessment:   1. History of amputation of lesser toe of right foot ([place])    Plan:  Patient was evaluated and treated and all questions answered.  S/p foot surgery right -Progressing as expected post-operatively. -XR:  See above -WB Status: Weightbearing as tolerated in surgical shoe -Sutures: Intact.  No clinical signs of Deis is noted no complication noted. -Medications: None -Foot redressed.  No follow-ups on file.

## 2021-11-29 ENCOUNTER — Encounter: Payer: Medicare Other | Admitting: Podiatry

## 2021-12-13 ENCOUNTER — Ambulatory Visit (INDEPENDENT_AMBULATORY_CARE_PROVIDER_SITE_OTHER): Payer: Medicare Other | Admitting: Podiatry

## 2021-12-13 DIAGNOSIS — Z89421 Acquired absence of other right toe(s): Secondary | ICD-10-CM

## 2021-12-13 NOTE — Progress Notes (Signed)
Subjective:  Patient ID: Thomas [name], male    DOB: 12/30/1950,  MRN: [id number]  Chief Complaint  Patient presents with   Routine Post Op    POV #2 DOS 11/21/2021 RT 4TH DIGIT AMPUTATION    DOS: 11/21/2021 Procedure: Right third and fourth digit amputation partial  71 y.o. male returns for post-op check.  Patient states he is doing okay.  No acute complaints he has not seen anyone else prior to seeing me.  Review of Systems: Negative except as noted in the HPI. Denies N/V/F/Ch.  Past Medical History:  Diagnosis Date   Atrial flutter ([place]) 01/2020   Cancer ([place])    prostate   Heart failure with reduced ejection fraction (HCC)    Hypertension    Hypoglycemia    occ   NICM (nonischemic cardiomyopathy) ([place]) 02/12/2020   Prostate cancer ([place]) 2019   Rheumatoid arthritis ([place])     Current Outpatient Medications:    acetaminophen (TYLENOL) 500 MG tablet, Take 500 mg by mouth every 6 (six) hours as needed for moderate pain., Disp: , Rfl:    amiodarone (PACERONE) 200 MG tablet, Take 1 tablet (200 mg total) by mouth daily., Disp: 90 tablet, Rfl: 3   apixaban (ELIQUIS) 5 MG TABS tablet, Take 1 tablet (5 mg total) by mouth 2 (two) times daily., Disp: 60 tablet, Rfl: 0   diazepam (VALIUM) 5 MG tablet, Take 1 tablet (5 mg total) by mouth at bedtime as needed., Disp: 30 tablet, Rfl: 1   doxycycline (VIBRA-TABS) 100 MG tablet, Take 1 tablet (100 mg total) by mouth 2 (two) times daily., Disp: 28 tablet, Rfl: 0   DULoxetine (CYMBALTA) 60 MG capsule, TAKE 1 CAPSULE BY MOUTH EVERY DAY -, Disp: 90 capsule, Rfl: 0   folic acid (FOLVITE) 1 MG tablet, Take 1 mg by mouth daily., Disp: , Rfl:    lisinopril (ZESTRIL) 10 MG tablet, Take 1 tablet (10 mg total) by mouth daily., Disp: 90 tablet, Rfl: 3   methotrexate (RHEUMATREX) 2.5 MG tablet, Take 15 mg by mouth every Wednesday. Caution:Chemotherapy. Protect from light., Disp: , Rfl:    metoprolol succinate (TOPROL XL) 25 MG 24 hr tablet, Take 1  tablet (25 mg total) by mouth daily., Disp: 90 tablet, Rfl: 3   NARCAN 4 MG/0.1ML LIQD nasal spray kit, Place 1 spray into the nose as needed (as directed for emergency)., Disp: 2 each, Rfl: 0   oxycodone (ROXICODONE) 30 MG immediate release tablet, Take 1 tablet (30 mg total) by mouth every 4 (four) hours as needed fo pain, Disp: 42 tablet, Rfl: 0   oxyCODONE-acetaminophen (PERCOCET) 5-325 MG tablet, Take 1 tablet by mouth every 4 (four) hours as needed for severe pain., Disp: 30 tablet, Rfl: 0   predniSONE (DELTASONE) 5 MG tablet, Take 5 mg by mouth daily with breakfast., Disp: , Rfl:    tadalafil (CIALIS) 20 MG tablet, Take 1 tablet (20 mg total) by mouth daily as needed., Disp: 30 tablet, Rfl: 5   testosterone cypionate (DEPOTESTOSTERONE CYPIONATE) 200 MG/ML injection, Inject 0.5 mLs into the muscle once a week., Disp: , Rfl:   Social History   Tobacco Use  Smoking Status Never  Smokeless Tobacco Never    Allergies  Allergen Reactions   Cefadroxil Hives and Palpitations    [facility]S [facility] ED from [facility].   Diltiazem Hcl Swelling   Chlorthalidone Other (See Comments)    "Makes me light-headed and I don't like the way it makes me feel"   Voltaren [  Diclofenac] Rash   Objective:  There were no vitals filed for this visit. There is no height or weight on file to calculate BMI. Constitutional Well developed. Well nourished.  Vascular Foot warm and well perfused. Capillary refill normal to all digits.   Neurologic Normal speech. Oriented to person, place, and time. Epicritic sensation to light touch grossly present bilaterally.  Dermatologic Skin completely epithelialized to the fourth toe.  Superficial Deis is noted to the third digit.  No signs of infection noted  Orthopedic: Nail tenderness to palpation noted about the surgical site.   Radiographs: None Assessment:   1. History of amputation of lesser toe of right foot ([place])     Plan:  Patient was evaluated and treated and all  questions answered.  S/p foot surgery right -Progressing as expected post-operatively. -XR: See above -WB Status: Weightbearing as tolerated in surgical shoe -Sutures: Removed s superficial dehiscence is noted.  Keep it covered with Neosporin and a Band-Aid. -Medications: None -Foot redressed.  No follow-ups on file.

## 2021-12-22 ENCOUNTER — Other Ambulatory Visit: Payer: Self-pay | Admitting: Family Medicine

## 2022-01-03 ENCOUNTER — Ambulatory Visit (INDEPENDENT_AMBULATORY_CARE_PROVIDER_SITE_OTHER): Payer: Medicare Other | Admitting: Podiatry

## 2022-01-03 DIAGNOSIS — Z89421 Acquired absence of other right toe(s): Secondary | ICD-10-CM

## 2022-01-03 NOTE — Progress Notes (Signed)
Subjective:  Patient ID: Thomas [name], male    DOB: [birth year],  MRN: [id number]  Chief Complaint  Patient presents with   Routine Post Op    POV #3 DOS 11/21/2021 AMPUTATION 4TH RT    DOS: 11/21/2021 Procedure: Right third and fourth digit amputation partial  71 y.o. male returns for post-op check.  Patient states he is doing okay.  No acute complaints he has not seen anyone else prior to seeing me.  Review of Systems: Negative except as noted in the HPI. Denies N/V/F/Ch.  Past Medical History:  Diagnosis Date   Atrial flutter ([place]) 01/2020   Cancer ([place])    prostate   Heart failure with reduced ejection fraction (HCC)    Hypertension    Hypoglycemia    occ   NICM (nonischemic cardiomyopathy) ([place]) 02/12/2020   Prostate cancer ([place]) 2019   Rheumatoid arthritis ([place])     Current Outpatient Medications:    acetaminophen (TYLENOL) 500 MG tablet, Take 500 mg by mouth every 6 (six) hours as needed for moderate pain., Disp: , Rfl:    amiodarone (PACERONE) 200 MG tablet, Take 1 tablet (200 mg total) by mouth daily., Disp: 90 tablet, Rfl: 3   apixaban (ELIQUIS) 5 MG TABS tablet, Take 1 tablet (5 mg total) by mouth 2 (two) times daily., Disp: 60 tablet, Rfl: 0   diazepam (VALIUM) 5 MG tablet, Take 1 tablet (5 mg total) by mouth at bedtime as needed., Disp: 30 tablet, Rfl: 1   doxycycline (VIBRA-TABS) 100 MG tablet, Take 1 tablet (100 mg total) by mouth 2 (two) times daily., Disp: 28 tablet, Rfl: 0   DULoxetine (CYMBALTA) 60 MG capsule, TAKE 1 CAPSULE BY MOUTH EVERY DAY -, Disp: 30 capsule, Rfl: 0   folic acid (FOLVITE) 1 MG tablet, Take 1 mg by mouth daily., Disp: , Rfl:    lisinopril (ZESTRIL) 10 MG tablet, Take 1 tablet (10 mg total) by mouth daily., Disp: 90 tablet, Rfl: 3   methotrexate (RHEUMATREX) 2.5 MG tablet, Take 15 mg by mouth every Wednesday. Caution:Chemotherapy. Protect from light., Disp: , Rfl:    metoprolol succinate (TOPROL XL) 25 MG 24 hr tablet, Take 1 tablet (25  mg total) by mouth daily., Disp: 90 tablet, Rfl: 3   NARCAN 4 MG/0.1ML LIQD nasal spray kit, Place 1 spray into the nose as needed (as directed for emergency)., Disp: 2 each, Rfl: 0   oxycodone (ROXICODONE) 30 MG immediate release tablet, Take 1 tablet (30 mg total) by mouth every 4 (four) hours as needed fo pain, Disp: 42 tablet, Rfl: 0   oxyCODONE-acetaminophen (PERCOCET) 5-325 MG tablet, Take 1 tablet by mouth every 4 (four) hours as needed for severe pain., Disp: 30 tablet, Rfl: 0   predniSONE (DELTASONE) 5 MG tablet, Take 5 mg by mouth daily with breakfast., Disp: , Rfl:    tadalafil (CIALIS) 20 MG tablet, Take 1 tablet (20 mg total) by mouth daily as needed., Disp: 30 tablet, Rfl: 5   testosterone cypionate (DEPOTESTOSTERONE CYPIONATE) 200 MG/ML injection, Inject 0.5 mLs into the muscle once a week., Disp: , Rfl:   Social History   Tobacco Use  Smoking Status Never  Smokeless Tobacco Never    Allergies  Allergen Reactions   Cefadroxil Hives and Palpitations    [facility] ED from [facility].   Diltiazem Hcl Swelling   Chlorthalidone Other (See Comments)    "Makes me light-headed and I don't like the way it makes me feel"   Voltaren [Diclofenac]  Rash   Objective:  There were no vitals filed for this visit. There is no height or weight on file to calculate BMI. Constitutional Well developed. Well nourished.  Vascular Foot warm and well perfused. Capillary refill normal to all digits.   Neurologic Normal speech. Oriented to person, place, and time. Epicritic sensation to light touch grossly present bilaterally.  Dermatologic Skin completely epithelialized to the fourth toe.  No further dehiscence noted.  The skin has completely reepithelialized  Orthopedic: Nail tenderness to palpation noted about the surgical site.   Radiographs: None Assessment:   No diagnosis found.   Plan:  Patient was evaluated and treated and all questions answered.  S/p foot surgery right -Clinic healed  and officially discharged from my care.  I discussed shoe gear modification offloading padding protecting he states understanding if any foot and ankle issues arise in the future of asked him to come back and see me.  He states understanding he is cleared from my standpoint to undergo hip replacement  No follow-ups on file.

## 2022-01-10 DIAGNOSIS — M25551 Pain in right hip: Secondary | ICD-10-CM | POA: Diagnosis not present

## 2022-01-20 ENCOUNTER — Other Ambulatory Visit: Payer: Self-pay | Admitting: Family Medicine

## 2022-01-23 DIAGNOSIS — M25551 Pain in right hip: Secondary | ICD-10-CM | POA: Diagnosis not present

## 2022-01-28 DIAGNOSIS — M79671 Pain in right foot: Secondary | ICD-10-CM | POA: Diagnosis not present

## 2022-01-30 NOTE — Patient Instructions (Addendum)
DUE TO COVID-19 ONLY TWO VISITORS  (aged 69 and older)  ARE ALLOWED TO COME WITH YOU AND STAY IN THE WAITING ROOM ONLY DURING PRE OP AND PROCEDURE.   **NO VISITORS ARE ALLOWED IN THE SHORT STAY AREA OR RECOVERY ROOM!!**  IF YOU WILL BE ADMITTED INTO THE HOSPITAL YOU ARE ALLOWED ONLY FOUR SUPPORT PEOPLE DURING VISITATION HOURS ONLY (7 AM -8PM)   The support person(s) must pass our screening, gel in and out, and wear a mask at all times, including in the patient's room. Patients must also wear a mask when staff or their support person are in the room. Visitors GUEST BADGE MUST BE WORN VISIBLY  One adult visitor may remain with you overnight and MUST be in the room by 8 P.M.     Your procedure is scheduled on: 02/15/22   Report to [facility] Main Entrance    Report to admitting at 7:45 AM   Call this number if you have problems the morning of surgery [phone]   Do not eat food :After Midnight.   After Midnight you may have the following liquids until _7:15_____ AM/ DAY OF SURGERY  Water Black Coffee (sugar ok, NO MILK/CREAM OR CREAMERS)  Tea (sugar ok, NO MILK/CREAM OR CREAMERS) regular and decaf                             Plain Jell-O (NO RED)                                           Fruit ices (not with fruit pulp, NO RED)                                     Popsicles (NO RED)                                                                  Juice: apple, WHITE grape, WHITE cranberry Sports drinks like Gatorade (NO RED)                   The day of surgery:  Drink ONE (1) Pre-Surgery Clear Ensure at  7:00 AM the morning of surgery. Drink in one sitting. Do not sip.  This drink was given to you during your hospital  pre-op appointment visit. Nothing else to drink after completing the  Pre-Surgery Clear Ensure at 7:15 AM          If you have questions, please contact your surgeon's office.       Oral Hygiene is also important to reduce your risk of infection.                                     Remember - BRUSH YOUR TEETH THE MORNING OF SURGERY WITH YOUR REGULAR TOOTHPASTE   Do NOT smoke after Midnight   Take these medicines the morning of surgery with A SIP OF WATER: pain med  Amiodarone-Pacerone                                                                                                                            Metoprolol- Toprol                                                                                                                             Prednisone                                                                                                                            Duloxetine-Cymbalta   Bring CPAP mask and tubing day of surgery.                              You may not have any metal on your body including  jewelry, and body piercing             Do not wear lotions, powders, perfumes/cologne, or deodorant                 Men may shave face and neck.   Do not bring valuables to the hospital. [place].   Contacts, dentures or bridgework may not be worn into surgery.   Bring small overnight bag day of surgery.   DO NOT [place]. PHARMACY WILL DISPENSE MEDICATIONS LISTED ON YOUR MEDICATION LIST TO YOU DURING YOUR ADMISSION [place]!     Special Instructions: Bring a copy of your healthcare power of attorney and living will documents  the day of surgery if you haven't scanned them before.              Please read over the following fact sheets  you were given: IF YOU HAVE QUESTIONS ABOUT YOUR PRE-OP INSTRUCTIONS PLEASE CALL [phone]     [facility] - Preparing for Surgery Before surgery, you can play an important role.  Because skin is not sterile, your skin needs to be as free of germs as possible.   You can reduce the number of germs on your skin by washing with CHG (chlorahexidine gluconate) soap before surgery.  CHG is an antiseptic cleaner which kills germs and bonds with the skin to continue killing germs even after washing. Please DO NOT use if you have an allergy to CHG or antibacterial soaps.  If your skin becomes reddened/irritated stop using the CHG and inform your nurse when you arrive at Short Stay.  You may shave your face/neck. Please follow these instructions carefully:  1.  Shower with CHG Soap the night before surgery and the  morning of Surgery.  2.  If you choose to wash your hair, wash your hair first as usual with your  normal  shampoo.  3.  After you shampoo, rinse your hair and body thoroughly to remove the  shampoo.                            4.  Use CHG as you would any other liquid soap.  You can apply chg directly  to the skin and wash                       Gently with a scrungie or clean washcloth.  5.  Apply the CHG Soap to your body ONLY FROM THE NECK DOWN.   Do not use on face/ open                           Wound or open sores. Avoid contact with eyes, ears mouth and genitals (private parts).                       Wash face,  Genitals (private parts) with your normal soap.             6.  Wash thoroughly, paying special attention to the area where your surgery  will be performed.  7.  Thoroughly rinse your body with warm water from the neck down.  8.  DO NOT shower/wash with your normal soap after using and rinsing off  the CHG Soap.                9.  Pat yourself dry with a clean towel.            10.  Wear clean pajamas.            11.  Place clean sheets on your bed the night of your first shower and do not  sleep with pets. Day of Surgery : Do not apply any lotions/deodorants the morning of surgery.  Please wear clean clothes to the hospital/surgery center.  FAILURE TO FOLLOW THESE INSTRUCTIONS MAY RESULT IN THE CANCELLATION OF YOUR  SURGERY    ________________________________________________________________________   Incentive Spirometer  An incentive spirometer is a tool that can help keep your lungs clear and active. This tool measures how well you are filling your lungs with each breath. Taking long deep breaths may help reverse or decrease the chance of developing breathing (pulmonary) problems (especially infection) following: A long period of time when you  are unable to move or be active. BEFORE THE PROCEDURE  If the spirometer includes an indicator to show your best effort, your nurse or respiratory therapist will set it to a desired goal. If possible, sit up straight or lean slightly forward. Try not to slouch. Hold the incentive spirometer in an upright position. INSTRUCTIONS FOR USE  Sit on the edge of your bed if possible, or sit up as far as you can in bed or on a chair. Hold the incentive spirometer in an upright position. Breathe out normally. Place the mouthpiece in your mouth and seal your lips tightly around it. Breathe in slowly and as deeply as possible, raising the piston or the ball toward the top of the column. Hold your breath for 3-5 seconds or for as long as possible. Allow the piston or ball to fall to the bottom of the column. Remove the mouthpiece from your mouth and breathe out normally. Rest for a few seconds and repeat Steps 1 through 7 at least 10 times every 1-2 hours when you are awake. Take your time and take a few normal breaths between deep breaths. The spirometer may include an indicator to show your best effort. Use the indicator as a goal to work toward during each repetition. After each set of 10 deep breaths, practice coughing to be sure your lungs are clear. If you have an incision (the cut made at the time of surgery), support your incision when coughing by placing a pillow or rolled up towels firmly against it. Once you are able to get out of bed, walk around indoors and  cough well. You may stop using the incentive spirometer when instructed by your caregiver.  RISKS AND COMPLICATIONS Take your time so you do not get dizzy or light-headed. If you are in pain, you may need to take or ask for pain medication before doing incentive spirometry. It is harder to take a deep breath if you are having pain. AFTER USE Rest and breathe slowly and easily. It can be helpful to keep track of a log of your progress. Your caregiver can provide you with a simple table to help with this. If you are using the spirometer at home, follow these instructions: [place] IF:  You are having difficultly using the spirometer. You have trouble using the spirometer as often as instructed. Your pain medication is not giving enough relief while using the spirometer. You develop fever of 100.5 F (38.1 C) or higher. SEEK IMMEDIATE MEDICAL CARE IF:  You cough up bloody sputum that had not been present before. You develop fever of 102 F (38.9 C) or greater. You develop worsening pain at or near the incision site. MAKE SURE YOU:  Understand these instructions. Will watch your condition. Will get help right away if you are not doing well or get worse. Document Released: 08/21/2006 Document Revised: 07/03/2011 Document Reviewed: 10/22/2006 [facility] Patient Information 2014 [place], [place].   ________________________________________________________________________

## 2022-02-02 NOTE — H&P (Signed)
HIP ARTHROPLASTY ADMISSION H&P  Patient ID: Thomas [name] MRN: [id number] DOB/AGE: 08/27/1950 71 y.o.  Chief Complaint: right hip pain.  Planned Procedure Date: 02/15/22 Medical Clearance by Dr. Erin Hearing Cardiac Clearance by Dr. Johney Frame Additional clearance by Dr. [name] ([facility])  HPI: Thomas [name] is a 71 y.o. male who presents for evaluation of OA RIGHT HIP WITH PAINFUL HARDWARE. Pain has been ongoing for the last five years.  He has a history of intramedullary nail with Dr. Doreatha Martin 05/05/2020.  He did well postoperatively.  Later in 2022 he had a fall at work from approximately 15 feet.  Initial imaging was negative, but he continued to have significant pain and a CT scan demonstrated varus angulation of the fracture concerning for nonunion.  He struggled to bear weight.  He underwent a revision on 12/24/2020.  He has continued to have pain and disability in the right hip.  He has undergone another CT scan in April which demonstrated intact hardware with persistent nonunion of the femur fracture.  He did try a bone stimulator for approximately three months.  He rates his pain to be 7 out of 10.  He is on chronic pain medication and taking 30 mg of oxycodone every four hours as needed.  He has done multiple rounds of physical therapy after the surgery.  He is here for definitive surgical treatment.    Past Medical History:  Diagnosis Date   Atrial flutter ([place]) 01/2020   Cancer ([place])    prostate   Heart failure with reduced ejection fraction (HCC)    Hypertension    Hypoglycemia    occ   NICM (nonischemic cardiomyopathy) ([place]) 02/12/2020   Prostate cancer ([place]) 2019   Rheumatoid arthritis [facility])    Past Surgical History:  Procedure Laterality Date   AMPUTATION TOE Right 12/2019   AMPUTATION TOE Right 03/26/2020   Procedure: Right 3rd toe partial amputation, bone biopsy right 1st metatarsal;  Surgeon: Evelina Bucy, DPM;  [facility];  Service: Podiatry;  Laterality:  Right;   BUBBLE STUDY  02/11/2020   Procedure: BUBBLE STUDY;  Surgeon: Geralynn Rile, MD;  Location: [place];  Service: Cardiovascular;;   CARDIOVERSION N/A 02/11/2020   Procedure: CARDIOVERSION;  Surgeon: Geralynn Rile, MD;  Location: [place];  Service: Cardiovascular;  Laterality: N/A;   CARDIOVERSION N/A 08/31/2021   Procedure: CARDIOVERSION;  Surgeon: Jerline Pain, MD;  Location: [facility] ENDOSCOPY;  Service: Cardiovascular;  Laterality: N/A;   CYSTOSCOPY  04/11/2018   Procedure: Erlene Quan;  Surgeon: [name], MD;  [facility];  Service: Urology;;  NO SEEDS FOUND IN BLADDER   HERNIA REPAIR  2009   inguinal   INTRAMEDULLARY (IM) NAIL INTERTROCHANTERIC Right 05/05/2020   Procedure: CPT 27245-Cephalomedullary nailing of right intertrochanteric femur fracture;  Surgeon: Shona Needles, MD;  Location: [place];  Service: Orthopedics;  Laterality: Right;   INTRAMEDULLARY (IM) NAIL INTERTROCHANTERIC Right 12/24/2020   Procedure: REVISION FIXATION OF RIGHT INTERTROCHANTERIC FEMUR NONUNION;  Surgeon: Shona Needles, MD;  Location: [place];  Service: Orthopedics;  Laterality: Right;   IRRIGATION AND DEBRIDEMENT FOOT Left 03/26/2020   Procedure: Left foot incision and drainage with removal of all non-viable soft tissue and bone - areas overlying the 2nd and 5th metatarsals.;  Surgeon: Evelina Bucy, DPM;  [facility];  Service: Podiatry;  Laterality: Left;   RADIOACTIVE SEED IMPLANT N/A 04/11/2018   Procedure: RADIOACTIVE SEED IMPLANT/BRACHYTHERAPY IMPLANT;  Surgeon: [name],  MD;  Location: [place];  Service: Urology;  Laterality: N/A;   67     SEEDS IMPLANTED   RIGHT/LEFT HEART CATH AND CORONARY ANGIOGRAPHY N/A 02/09/2020   Procedure: RIGHT/LEFT HEART CATH AND CORONARY ANGIOGRAPHY;  Surgeon: Nelva Bush, MD;  Location: [place] CV LAB;  Service: Cardiovascular;  Laterality: N/A;   SPACE OAR  INSTILLATION N/A 04/11/2018   Procedure: SPACE OAR INSTILLATION;  Surgeon: [name], MD;  Location: [facility];  Service: Urology;  Laterality: N/A;   TEE WITHOUT CARDIOVERSION N/A 02/11/2020   Procedure: TRANSESOPHAGEAL ECHOCARDIOGRAM (TEE);  Surgeon: Geralynn Rile, MD;  Location: [place];  Service: Cardiovascular;  Laterality: N/A;   TOTAL KNEE ARTHROPLASTY Bilateral 09/13/2015   Procedure: BILATERAL KNEE ARTHROPLASTY ;  Surgeon: Paralee Cancel, MD;  [facility];  Service: Orthopedics;  Laterality: Bilateral;   Allergies  Allergen Reactions   Cefadroxil Hives and Palpitations    EMS to ED from UC.   Diltiazem Hcl Swelling   Chlorthalidone Other (See Comments)    "Makes me light-headed and I don't like the way it makes me feel"   Voltaren [Diclofenac] Rash   Prior to Admission medications   Medication Sig Start Date End Date Taking? Authorizing Provider  apixaban (ELIQUIS) 5 MG TABS tablet Take 1 tablet (5 mg total) by mouth 2 (two) times daily. 12/24/20  Yes McClung, Sarah A, PA-C  atorvastatin (LIPITOR) 40 MG tablet Take 40 mg by mouth daily.   Yes [provider]  diazepam (VALIUM) 5 MG tablet Take 1 tablet (5 mg total) by mouth at bedtime as needed. Patient taking differently: Take 5 mg by mouth at bedtime. 02/11/21  Yes   DULoxetine (CYMBALTA) 60 MG capsule TAKE 1 CAPSULE BY MOUTH EVERY DAY - 01/20/22  Yes Chambliss, Jeb Levering, MD  folic acid (FOLVITE) 1 MG tablet Take 1 mg by mouth daily. 08/01/21  Yes [provider]  lisinopril (ZESTRIL) 10 MG tablet Take 1 tablet (10 mg total) by mouth daily. 09/26/21  Yes Freada Bergeron, MD  methotrexate (RHEUMATREX) 2.5 MG tablet Take 15 mg by mouth every Wednesday. Caution:Chemotherapy. Protect from light.   Yes [provider]  metoprolol succinate (TOPROL XL) 25 MG 24 hr tablet Take 1 tablet (25 mg total) by mouth daily. 08/02/21  Yes Freada Bergeron, MD  NARCAN 4  MG/0.1ML LIQD nasal spray kit Place 1 spray into the nose as needed (as directed for emergency). 02/11/20  Yes Sande Rives E, PA-C  oxycodone (ROXICODONE) 30 MG immediate release tablet Take 1 tablet (30 mg total) by mouth every 4 (four) hours as needed fo pain Patient taking differently: Take 30 mg by mouth every 4 (four) hours as needed for pain. 03/25/21  Yes   predniSONE 5 MG TBEC Take 7.5 mg by mouth daily with breakfast.   Yes [provider]  testosterone cypionate (DEPOTESTOSTERONE CYPIONATE) 200 MG/ML injection Inject 100 mg into the muscle once a week. 09/08/20  Yes [provider]   Social History   Socioeconomic History   Marital status: Married    Spouse name: Not on file   Number of children: Not on file   Years of education: Not on file   Highest education level: Not on file  Occupational History   Not on file  Tobacco Use   Smoking status: Never   Smokeless tobacco: Never  Vaping Use   Vaping Use: Never used  Substance and Sexual Activity  Alcohol use: Yes    Alcohol/week: 7.0 standard drinks of alcohol    Types: 7 Standard drinks or equivalent per week   Drug use: Not Currently   Sexual activity: Yes  Other Topics Concern   Not on file  Social History Narrative   Not on file   Social Determinants of Health   Financial Resource Strain: Not on file  Food Insecurity: Not on file  Transportation Needs: Not on file  Physical Activity: Not on file  Stress: Not on file  Social Connections: Not on file   Family History  Problem Relation Age of Onset   Prostate cancer Father    Prostate cancer Brother    Colon cancer Neg Hx    Rectal cancer Neg Hx    Stomach cancer Neg Hx     ROS: Currently denies lightheadedness, dizziness, Fever, chills, CP, SOB.   No personal history of DVT, PE, MI, or CVA. No loose teeth. He does have dentures. All other systems have been reviewed and were otherwise currently negative with the exception of those  mentioned in the HPI and as above.  Objective: Vitals: Height 6 feet 7 inches, weight 226.8 pounds.  BP 159/92, pulse 65, oxygen saturation 97%, temperature 98.1.  Physical Exam: General: Alert, NAD. Trendelenberg Gait  HEENT: EOMI, Good Neck Extension  Pulm: No increased work of breathing.  Clear B/L A/P w/o crackle or wheeze.  CV: RRR, No m/g/r appreciated  GI: soft, NT, ND Neuro: Neuro without gross focal deficit.  Sensation intact distally Skin: No lesions in the area of chief complaint MSK/Surgical Site: .  He ambulates with a significantly antalgic gait with use of a cane.  Incisions at the hip look to be well healed.  Right hip flexion 0 to 90 degrees, 20 degrees of internal rotation, painful with active and passive internal and external rotation.  He has 20 degrees of external rotation.  EHL and FHL are intact.    Imaging Review CT scan shows chronic nonunion of the right intertrochanteric femur fracture.    Preoperative templating of the joint replacement has been completed, documented, and submitted to the Operating Room personnel in order to optimize intra-operative equipment management.  Assessment: Chronic nonunion right intertrochanteric femur fracture.    Plan: Plan for Procedure(s): CONVERSION TO TOTAL HIP, HARDWARE REMOVAL  The patient history, physical exam, clinical judgement of the provider and imaging are consistent with end stage degenerative joint disease and total joint arthroplasty is deemed medically necessary. The treatment options including medical management, injection therapy, and arthroplasty were discussed at length. The risks and benefits of Procedure(s): CONVERSION TO TOTAL HIP, HARDWARE REMOVAL were presented and reviewed.  The risks of nonoperative treatment, versus surgical intervention including but not limited to continued pain, aseptic loosening, stiffness, dislocation/subluxation, infection, bleeding, nerve injury, blood clots, cardiopulmonary  complications, morbidity, mortality, among others were discussed. The patient verbalizes understanding and wishes to proceed with the plan.  Patient is being admitted for surgery, pain control, PT, prophylactic antibiotics, VTE prophylaxis, progressive ambulation, ADL's and discharge planning.   Dental prophylaxis discussed and recommended for 2 years postoperatively.   Ventura Bruns, PA-C 02/02/2022 4:22 PM

## 2022-02-03 ENCOUNTER — Other Ambulatory Visit: Payer: Self-pay

## 2022-02-03 ENCOUNTER — Encounter (HOSPITAL_COMMUNITY): Payer: Self-pay

## 2022-02-03 ENCOUNTER — Encounter (HOSPITAL_COMMUNITY)
Admission: RE | Admit: 2022-02-03 | Discharge: 2022-02-03 | Disposition: A | Discharge status: Home / Self Care | Payer: Medicare Other | Source: Ambulatory Visit | Attending: Orthopedic Surgery | Admitting: Orthopedic Surgery

## 2022-02-03 DIAGNOSIS — I4892 Unspecified atrial flutter: Secondary | ICD-10-CM | POA: Insufficient documentation

## 2022-02-03 DIAGNOSIS — Z01812 Encounter for preprocedural laboratory examination: Secondary | ICD-10-CM | POA: Insufficient documentation

## 2022-02-03 DIAGNOSIS — I428 Other cardiomyopathies: Secondary | ICD-10-CM | POA: Diagnosis not present

## 2022-02-03 DIAGNOSIS — T8484XA Pain due to internal orthopedic prosthetic devices, implants and grafts, initial encounter: Secondary | ICD-10-CM | POA: Diagnosis not present

## 2022-02-03 DIAGNOSIS — Z7901 Long term (current) use of anticoagulants: Secondary | ICD-10-CM | POA: Insufficient documentation

## 2022-02-03 DIAGNOSIS — M1611 Unilateral primary osteoarthritis, right hip: Secondary | ICD-10-CM | POA: Diagnosis not present

## 2022-02-03 DIAGNOSIS — I10 Essential (primary) hypertension: Secondary | ICD-10-CM | POA: Diagnosis not present

## 2022-02-03 DIAGNOSIS — Z01818 Encounter for other preprocedural examination: Secondary | ICD-10-CM

## 2022-02-03 LAB — BASIC METABOLIC PANEL
Anion gap: 7 (ref 5–15)
BUN: 19 mg/dL (ref 8–23)
CO2: 28 mmol/L (ref 22–32)
Calcium: 8.7 mg/dL — ABNORMAL LOW (ref 8.9–10.3)
Chloride: 107 mmol/L (ref 98–111)
Creatinine, Ser: 1 mg/dL (ref 0.61–1.24)
GFR, Estimated: >60 mL/min (ref >60)
Glucose, Bld: 161 mg/dL — ABNORMAL HIGH (ref 70–99)
Potassium: 4 mmol/L (ref 3.5–5.1)
Sodium: 142 mmol/L (ref 135–145)

## 2022-02-03 LAB — CBC
HCT: 39.6 % (ref 39.0–52.0)
Hemoglobin: 11.7 g/dL — ABNORMAL LOW (ref 13.0–17.0)
MCH: 24.2 pg — ABNORMAL LOW (ref 26.0–34.0)
MCHC: 29.5 g/dL — ABNORMAL LOW (ref 30.0–36.0)
MCV: 81.8 fL (ref 80.0–100.0)
Platelets: 311 10*3/uL (ref 150–400)
RBC: 4.84 MIL/uL (ref 4.22–5.81)
RDW: 15.9 % — ABNORMAL HIGH (ref 11.5–15.5)
WBC: 10.6 10*3/uL — ABNORMAL HIGH (ref 4.0–10.5)
nRBC: 0 % (ref 0.0–0.2)

## 2022-02-03 LAB — SURGICAL PCR SCREEN
MRSA, PCR: NEGATIVE
Staphylococcus aureus: NEGATIVE

## 2022-02-03 NOTE — Progress Notes (Signed)
Anesthesia note:  Bowel prep reminder:NA  PCP - Dr. Jerilynn Mages. Chambliss Cardiologist -Dr. Johney Frame Other- Dr. [name]  Chest x-ray - 05/22/21-epic EKG - 10/04/21-epic Stress Test - no ECHO - 11/10/21-epic Cardiac Cath - 2021  Pacemaker/ICD device last checked:na  Sleep Study - no CPAP -   Pt is pre diabetic-NA Fasting Blood Sugar -  Checks Blood Sugar _____  Blood Thinner:Eliquis/ Dr. Johney Frame f fib Blood Thinner Instructions:stop 3 days prior to DOS Aspirin Instructions: Last Dose:10/22  Anesthesia review: yes  Patient denies shortness of breath, fever, cough and chest pain at PAT appointment Pt reports no SOB with activities. He has ulcers on is feet. The one on the Rt is closed but the one on the left is open he will see a Dr. Prior to [employer].  Patient verbalized understanding of instructions that were given to them at the PAT appointment. Patient was also instructed that they will need to review over the PAT instructions again at home before surgery. yes

## 2022-02-06 ENCOUNTER — Encounter (HOSPITAL_COMMUNITY): Payer: Self-pay | Admitting: Physician Assistant

## 2022-02-06 NOTE — Anesthesia Preprocedure Evaluation (Signed)
Anesthesia Evaluation    Airway        Dental   Pulmonary           Cardiovascular hypertension,      Neuro/Psych    GI/Hepatic   Endo/Other    Renal/GU      Musculoskeletal   Abdominal   Peds  Hematology   Anesthesia Other Findings   Reproductive/Obstetrics                             Anesthesia Physical Anesthesia Plan  ASA:   Anesthesia Plan:    Post-op Pain Management:    Induction:   PONV Risk Score and Plan:   Airway Management Planned:   Additional Equipment:   Intra-op Plan:   Post-operative Plan:   Informed Consent:   Plan Discussed with:   Anesthesia Plan Comments: (See PAT note 02/03/2022)        Anesthesia Quick Evaluation

## 2022-02-06 NOTE — Progress Notes (Signed)
Anesthesia Chart Review   Case: [id number] Date/Time: 02/15/22 1006   Procedure: CONVERSION TO TOTAL HIP, HARDWARE REMOVAL (Right: Hip)   Anesthesia type: Spinal   Pre-op diagnosis: OA RIGHT HIP WITH PAINFUL HARDWARE   [facility] / [facility]   Surgeons: Willaim Sheng, MD       DISCUSSION:71 y.o. never smoker with h/o HTN, NICM, atrial flutter (on Eliquis), right hip with painful hardware scheduled for above procedure 02/15/2022 with Dr. Charlies Constable.   Per cardiology preoperative evaluation 11/02/2021, "Chart reviewed as part of pre-operative protocol coverage.  Thomas [name] was seen by Ermalinda Barrios, PA for cardiac evaluation for upcoming surgery on 10/04/21. He was felt to be at acceptable risk for upcoming surgery with RCRI for MACE of 0.9%. His functional capacity in METs is 7.25 according to the Duke Activity Status Index (DASI).    Per office protocol, patient can hold Eliquis for 3 days prior to procedure."  Anticipate pt can proceed with planned procedure barring acute status change.   VS: BP 135/71   Pulse 75   Temp 36.8 C (Oral)   Resp 18   Ht '6\' 8"'  (2.032 m)   Wt 103 kg   SpO2 96%   BMI 24.94 kg/m   PROVIDERS: [name], MD is PCP   Cardiologist -Dr. Johney Frame LABS: Labs reviewed: Acceptable for surgery. (all labs ordered are listed, but only abnormal results are displayed)  Labs Reviewed  BASIC METABOLIC PANEL - Abnormal; Notable for the following components:      Result Value   Glucose, Bld 161 (*)    Calcium 8.7 (*)    All other components within normal limits  CBC - Abnormal; Notable for the following components:   WBC 10.6 (*)    Hemoglobin 11.7 (*)    MCH 24.2 (*)    MCHC 29.5 (*)    RDW 15.9 (*)    All other components within normal limits  SURGICAL PCR SCREEN     IMAGES:   EKG:   CV: Echo 11/10/2021  1. Limited echo for LV function   2. Left ventricular ejection fraction, by estimation, is 55 to 60%. Left   ventricular ejection fraction by 3D volume is 56 %. The left ventricle has  normal function. The left ventricle has no regional wall motion  abnormalities. There is mild left  ventricular hypertrophy. Left ventricular diastolic parameters are  consistent with Grade I diastolic dysfunction (impaired relaxation).   3. The aortic valve is tricuspid. Aortic valve regurgitation is not  visualized.   4. Aortic dilatation noted. There is mild dilatation of the aortic root,  measuring 41 mm.   Cardiac Cath 02/09/2020 Conclusions: No angiographically significant coronary artery disease. Upper normal left heart, right heart, and pulmonary artery pressures. Mildly reduced Fick cardiac output/index.   Recommendations: Restart heparin infusion 2 hours after TR band removal.  If there is no evidence of bleeding/vascular complication, consider transitioning back to apixaban as soon as tomorrow. Proceed with TEE/cardioversion as soon as tomorrow.  I will make Thomas [name] NPO after midnight in anticipation of this. Optimize evidence-based heart failure therapy for non-ischemic cardiomyopathy. Primary prevention of coronary artery disease. Past Medical History:  Diagnosis Date   Atrial flutter ([place]) 01/2020   Cancer ([place])    prostate   Heart failure with reduced ejection fraction (HCC)    Hypertension    Hypoglycemia    occ   NICM (nonischemic cardiomyopathy) ([place]) 02/12/2020   Prostate cancer (  [place]) 2019   Rheumatoid arthritis [facility])     Past Surgical History:  Procedure Laterality Date   AMPUTATION TOE Right 03/26/2020   Procedure: Right 3rd toe partial amputation, bone biopsy right 1st metatarsal;  Surgeon: Evelina Bucy, DPM;  [facility];  Service: Podiatry;  Laterality: Right;   BUBBLE STUDY  02/11/2020   Procedure: BUBBLE STUDY;  Surgeon: Geralynn Rile, MD;  Location: [place];  Service: Cardiovascular;;   CARDIOVERSION N/A 02/11/2020   Procedure: CARDIOVERSION;   Surgeon: Geralynn Rile, MD;  Location: [place];  Service: Cardiovascular;  Laterality: N/A;   CARDIOVERSION N/A 08/31/2021   Procedure: CARDIOVERSION;  Surgeon: Jerline Pain, MD;  Location: [facility] ENDOSCOPY;  Service: Cardiovascular;  Laterality: N/A;   CYSTOSCOPY  04/11/2018   Procedure: Erlene Quan;  Surgeon: [name], MD;  Location: [facility];  Service: Urology;;  NO SEEDS FOUND IN BLADDER   HERNIA REPAIR  2009   inguinal   INTRAMEDULLARY (IM) NAIL INTERTROCHANTERIC Right 05/05/2020   Procedure: CPT 27245-Cephalomedullary nailing of right intertrochanteric femur fracture;  Surgeon: Shona Needles, MD;  Location: [place];  Service: Orthopedics;  Laterality: Right;   INTRAMEDULLARY (IM) NAIL INTERTROCHANTERIC Right 12/24/2020   Procedure: REVISION FIXATION OF RIGHT INTERTROCHANTERIC FEMUR NONUNION;  Surgeon: Shona Needles, MD;  Location: [place];  Service: Orthopedics;  Laterality: Right;   IRRIGATION AND DEBRIDEMENT FOOT Left 03/26/2020   Procedure: Left foot incision and drainage with removal of all non-viable soft tissue and bone - areas overlying the 2nd and 5th metatarsals.;  Surgeon: Evelina Bucy, DPM;  [facility];  Service: Podiatry;  Laterality: Left;   RADIOACTIVE SEED IMPLANT N/A 04/11/2018   Procedure: RADIOACTIVE SEED IMPLANT/BRACHYTHERAPY IMPLANT;  Surgeon: [name], MD;  Location: [facility];  Service: Urology;  Laterality: N/A;   20     SEEDS IMPLANTED   RIGHT/LEFT HEART CATH AND CORONARY ANGIOGRAPHY N/A 02/09/2020   Procedure: RIGHT/LEFT HEART CATH AND CORONARY ANGIOGRAPHY;  Surgeon: Nelva Bush, MD;  Location: [place] CV LAB;  Service: Cardiovascular;  Laterality: N/A;   SPACE OAR INSTILLATION N/A 04/11/2018   Procedure: SPACE OAR INSTILLATION;  Surgeon: [name], MD;  Location: [facility];  Service: Urology;  Laterality: N/A;   TEE WITHOUT CARDIOVERSION N/A  02/11/2020   Procedure: TRANSESOPHAGEAL ECHOCARDIOGRAM (TEE);  Surgeon: Geralynn Rile, MD;  Location: [place];  Service: Cardiovascular;  Laterality: N/A;   TOTAL KNEE ARTHROPLASTY Bilateral 09/13/2015   Procedure: BILATERAL KNEE ARTHROPLASTY ;  Surgeon: Paralee Cancel, MD;  [facility];  Service: Orthopedics;  Laterality: Bilateral;    MEDICATIONS:  apixaban (ELIQUIS) 5 MG TABS tablet   atorvastatin (LIPITOR) 40 MG tablet   diazepam (VALIUM) 5 MG tablet   DULoxetine (CYMBALTA) 60 MG capsule   folic acid (FOLVITE) 1 MG tablet   lisinopril (ZESTRIL) 10 MG tablet   methotrexate (RHEUMATREX) 2.5 MG tablet   metoprolol succinate (TOPROL XL) 25 MG 24 hr tablet   NARCAN 4 MG/0.1ML LIQD nasal spray kit   oxycodone (ROXICODONE) 30 MG immediate release tablet   predniSONE 5 MG TBEC   testosterone cypionate (DEPOTESTOSTERONE CYPIONATE) 200 MG/ML injection   No current facility-administered medications for this encounter.   Konrad Felix Ward, PA-C [facility] [phone]

## 2022-02-07 DIAGNOSIS — M25551 Pain in right hip: Secondary | ICD-10-CM | POA: Diagnosis not present

## 2022-02-09 ENCOUNTER — Encounter (HOSPITAL_COMMUNITY): Payer: Self-pay | Admitting: Orthopedic Surgery

## 2022-02-09 ENCOUNTER — Other Ambulatory Visit: Payer: Self-pay

## 2022-02-09 ENCOUNTER — Ambulatory Visit: Payer: Medicare Other | Admitting: Orthopedic Surgery

## 2022-02-09 ENCOUNTER — Encounter: Payer: Self-pay | Admitting: Orthopedic Surgery

## 2022-02-09 DIAGNOSIS — M869 Osteomyelitis, unspecified: Secondary | ICD-10-CM | POA: Diagnosis not present

## 2022-02-09 NOTE — Progress Notes (Signed)
Office Visit Note   Patient: Thomas [name]           Date of Birth: 03-04-1951           MRN: [id number] Visit Date: 02/09/2022              Requested by: [name], MD [address] [place],  [place] [address] PCP: [name], MD  Chief Complaint  Patient presents with   Right Foot - Wound Check      HPI: Patient is a 71 year old gentleman who is seen for initial evaluation for osteomyelitis right forefoot.  Patient has rheumatoid arthritis and is scheduled for total hip arthroplasty with Dr. Zachery Dakins.  Patient presents with osteomyelitis of the toes of the right foot.  Patient states he is off antibiotics at this time.  He has rheumatoid arthritis no history of gout and is on methotrexate.  Assessment & Plan: Visit Diagnoses:  1. Osteomyelitis of third toe of right foot ([place])     Plan: With the infection of the third and fifth toe with severe clawing of the second toe and status post great toe amputation recommended proceeding with a transmetatarsal amputation.  Risk and benefits were discussed including risk of the wound not healing.  Patient states he understands wished to proceed at this time.  We will plan for surgery tomorrow this should give him the best chance of healing prior to his hip surgery.  Follow-Up Instructions: Return in about 1 week (around 02/16/2022).   Ortho Exam  Patient is alert, oriented, no adenopathy, well-dressed, normal affect, normal respiratory effort. Examination patient has a strong dorsalis pedis pulse previous amputation of the great toe with partial amputation of the third and fourth toes.  He has severe clawing of the second toe with no open ulcers.  Review of the MRI scan shows osteomyelitis of the proximal phalanx of the third toe as well as edema in the fifth toe consistent with early osteomyelitis.  Patient has an ulcer over the third toe that probes to bone.  There is no ascending cellulitis.  Patient's  hemoglobin A1c 2 years ago was 6.2.  Imaging: No results found. No images are attached to the encounter.  Labs: Lab Results  Component Value Date   HGBA1C 6.2 (H) 02/03/2020   HGBA1C 5.3 10/28/2015   HGBA1C 5.6 08/10/2014   ESRSEDRATE 14 06/21/2021   ESRSEDRATE 17 05/18/2021   ESRSEDRATE 20 (H) 03/28/2020   CRP 19.2 (H) 06/21/2021   CRP 11.1 (H) 05/18/2021   CRP 2.7 (H) 03/28/2020   REPTSTATUS 03/31/2020 FINAL 03/26/2020   GRAMSTAIN NO WBC SEEN NO ORGANISMS SEEN  03/26/2020   CULT  03/26/2020    No growth aerobically or anaerobically. Performed at [facility], [place] [address], [place], [place] [address]    LABORGA STAPHYLOCOCCUS AUREUS 03/26/2020     Lab Results  Component Value Date   ALBUMIN 3.0 (L) 03/26/2020   ALBUMIN 3.6 03/25/2020   ALBUMIN 4.1 03/24/2020    Lab Results  Component Value Date   MG 1.8 05/22/2021   MG 2.0 02/12/2020   MG 1.7 02/11/2020   Lab Results  Component Value Date   VD25OH 28.61 (L) 05/05/2020    No results found for: "PREALBUMIN"    Latest Ref Rng & Units 02/03/2022    2:24 PM 08/26/2021   10:15 AM 08/02/2021    9:35 AM  CBC EXTENDED  WBC 4.0 - 10.5 K/uL 10.6  9.0  6.6   RBC 4.22 - 5.81 MIL/uL 4.84  4.89  4.58   Hemoglobin 13.0 - 17.0 g/dL 11.7  12.1  11.2   HCT 39.0 - 52.0 % 39.6  38.6  35.9   Platelets 150 - 400 K/uL 311  337  343      There is no height or weight on file to calculate BMI.  Orders:  No orders of the defined types were placed in this encounter.  No orders of the defined types were placed in this encounter.    Procedures: No procedures performed  Clinical Data: No additional findings.  ROS:  All other systems negative, except as noted in the HPI. Review of Systems  Objective: Vital Signs: There were no vitals taken for this visit.  Specialty Comments:  No specialty comments available.  PMFS History: Patient Active Problem List   Diagnosis Date Noted   Fracture of  intertrochanteric section of femur, closed, right, with nonunion, subsequent encounter 12/24/2020   Inguinal hernia 10/18/2020   Chronic combined systolic and diastolic CHF, NYHA class 2 ([place])    Osteoporosis 05/05/2020   Foot infection    NICM (nonischemic cardiomyopathy) ([place]), no significant CAD on cardiac cath   02/12/2020   Right foot pain    Rheumatoid arthritis ([place])    Atrial flutter ([place]) 02/02/2020   Lower extremity edema 12/30/2019   Weight loss, non-intentional 11/24/2019   Chronic right hip pain 06/19/2019   Degenerative tear of acetabular labrum of right hip 05/02/2019   Primary osteoarthritis of right hip 05/02/2019   Skin ulcer ([place]) 01/29/2019   Malignant neoplasm of prostate ([place]) 01/16/2018   Insomnia 10/02/2017   S/P bilateral TKA 09/13/2015   Other bilateral secondary osteoarthritis of knee 07/14/2014   Heme positive stool 05/21/2013   Osteoarthritis, multiple sites 01/13/2011   Hypertension 12/06/2010   CLAUSTROPHOBIA 03/01/2010   Past Medical History:  Diagnosis Date   Atrial flutter ([place]) 01/2020   Cancer ([place])    prostate   Heart failure with reduced ejection fraction (HCC)    Hypertension    Hypoglycemia    occ   NICM (nonischemic cardiomyopathy) ([place]) 02/12/2020   Prostate cancer ([place]) 2019   Rheumatoid arthritis ([place])     Family History  Problem Relation Age of Onset   Prostate cancer Father    Prostate cancer Brother    Colon cancer Neg Hx    Rectal cancer Neg Hx    Stomach cancer Neg Hx     Past Surgical History:  Procedure Laterality Date   AMPUTATION TOE Right 03/26/2020   Procedure: Right 3rd toe partial amputation, bone biopsy right 1st metatarsal;  Surgeon: Evelina Bucy, DPM;  [facility];  Service: Podiatry;  Laterality: Right;   BUBBLE STUDY  02/11/2020   Procedure: BUBBLE STUDY;  Surgeon: Geralynn Rile, MD;  Location: [place];  Service: Cardiovascular;;   CARDIOVERSION N/A 02/11/2020   Procedure:  CARDIOVERSION;  Surgeon: Geralynn Rile, MD;  Location: [place];  Service: Cardiovascular;  Laterality: N/A;   CARDIOVERSION N/A 08/31/2021   Procedure: CARDIOVERSION;  Surgeon: Jerline Pain, MD;  Location: [facility];  Service: Cardiovascular;  Laterality: N/A;   CYSTOSCOPY  04/11/2018   Procedure: Erlene Quan;  Surgeon: [name], MD;  Location[facility];  Service: Urology;;  NO SEEDS FOUND IN BLADDER   HERNIA REPAIR  2009   inguinal   INTRAMEDULLARY (IM) NAIL INTERTROCHANTERIC Right 05/05/2020   Procedure: CPT 27245-Cephalomedullary nailing of  right intertrochanteric femur fracture;  Surgeon: Shona Needles, MD;  Location: [place];  Service: Orthopedics;  Laterality: Right;   INTRAMEDULLARY (IM) NAIL INTERTROCHANTERIC Right 12/24/2020   Procedure: REVISION FIXATION OF RIGHT INTERTROCHANTERIC FEMUR NONUNION;  Surgeon: Shona Needles, MD;  Location: [place];  Service: Orthopedics;  Laterality: Right;   IRRIGATION AND DEBRIDEMENT FOOT Left 03/26/2020   Procedure: Left foot incision and drainage with removal of all non-viable soft tissue and bone - areas overlying the 2nd and 5th metatarsals.;  Surgeon: Evelina Bucy, DPM;  [facility];  Service: Podiatry;  Laterality: Left;   RADIOACTIVE SEED IMPLANT N/A 04/11/2018   Procedure: RADIOACTIVE SEED IMPLANT/BRACHYTHERAPY IMPLANT;  Surgeon: [name], MD;  Location: [facility];  Service: Urology;  Laterality: N/A;   42     SEEDS IMPLANTED   RIGHT/LEFT HEART CATH AND CORONARY ANGIOGRAPHY N/A 02/09/2020   Procedure: RIGHT/LEFT HEART CATH AND CORONARY ANGIOGRAPHY;  Surgeon: Nelva Bush, MD;  Location: [place] [facility];  Service: Cardiovascular;  Laterality: N/A;   SPACE OAR INSTILLATION N/A 04/11/2018   Procedure: SPACE OAR INSTILLATION;  Surgeon: [name], MD;  Location: [facility];  Service: Urology;  Laterality: N/A;   TEE WITHOUT  CARDIOVERSION N/A 02/11/2020   Procedure: TRANSESOPHAGEAL ECHOCARDIOGRAM (TEE);  Surgeon: Geralynn Rile, MD;  Location: [facility];  Service: Cardiovascular;  Laterality: N/A;   TOTAL KNEE ARTHROPLASTY Bilateral 09/13/2015   Procedure: BILATERAL KNEE ARTHROPLASTY ;  Surgeon: Paralee Cancel, MD;  [facility];  Service: Orthopedics;  Laterality: Bilateral;   Social History   Occupational History   Not on file  Tobacco Use   Smoking status: Never   Smokeless tobacco: Never  Vaping Use   Vaping Use: Never used  Substance and Sexual Activity   Alcohol use: Yes    Alcohol/week: 7.0 standard drinks of alcohol    Types: 7 Standard drinks or equivalent per week   Drug use: Not Currently   Sexual activity: Yes

## 2022-02-09 NOTE — Progress Notes (Signed)
Spoke with pt for pre-op call. Pt has hx of A-Flutter and is on Eliquis. He states that his last dose was Tuesday, 02/07/22 AM dose. He states Dr. Johney Frame instructed him to hold it 3 days prior (for another surgery that was supposed to be done in the next week which has been postponed until after this surgery). Pt states he is not diabetic.   Shower instructions given to pt and he voiced understanding.   Pt is also very concerned about his pain in the AM when he arrives here. He states his refill for his Oxycodone 30 mg will not be ready before he comes in for surgery and his last dose of it will be tonight. He states he is going to need one when he gets here. I explained to him that the anesthesiologist will talk with him and order what they can give him.

## 2022-02-10 ENCOUNTER — Ambulatory Visit (HOSPITAL_BASED_OUTPATIENT_CLINIC_OR_DEPARTMENT_OTHER): Payer: Medicare Other | Admitting: Certified Registered Nurse Anesthetist

## 2022-02-10 ENCOUNTER — Other Ambulatory Visit: Payer: Self-pay

## 2022-02-10 ENCOUNTER — Ambulatory Visit (HOSPITAL_COMMUNITY): Payer: Medicare Other | Admitting: Certified Registered Nurse Anesthetist

## 2022-02-10 ENCOUNTER — Encounter (HOSPITAL_COMMUNITY): Payer: Self-pay | Admitting: Orthopedic Surgery

## 2022-02-10 ENCOUNTER — Encounter (HOSPITAL_COMMUNITY)
Admission: RE | Disposition: A | Discharge status: Home / Self Care | Payer: Self-pay | Source: Ambulatory Visit | Attending: Orthopedic Surgery

## 2022-02-10 ENCOUNTER — Ambulatory Visit (HOSPITAL_COMMUNITY)
Admission: RE | Admit: 2022-02-10 | Discharge: 2022-02-10 | Disposition: A | Discharge status: Home / Self Care | Payer: Medicare Other | Source: Ambulatory Visit | Attending: Orthopedic Surgery | Admitting: Orthopedic Surgery

## 2022-02-10 DIAGNOSIS — I509 Heart failure, unspecified: Secondary | ICD-10-CM | POA: Insufficient documentation

## 2022-02-10 DIAGNOSIS — I110 Hypertensive heart disease with heart failure: Secondary | ICD-10-CM | POA: Insufficient documentation

## 2022-02-10 DIAGNOSIS — M869 Osteomyelitis, unspecified: Secondary | ICD-10-CM

## 2022-02-10 DIAGNOSIS — M86671 Other chronic osteomyelitis, right ankle and foot: Secondary | ICD-10-CM | POA: Diagnosis not present

## 2022-02-10 DIAGNOSIS — Z79899 Other long term (current) drug therapy: Secondary | ICD-10-CM | POA: Diagnosis not present

## 2022-02-10 DIAGNOSIS — M1990 Unspecified osteoarthritis, unspecified site: Secondary | ICD-10-CM

## 2022-02-10 DIAGNOSIS — M069 Rheumatoid arthritis, unspecified: Secondary | ICD-10-CM | POA: Diagnosis not present

## 2022-02-10 HISTORY — DX: Anemia, unspecified: D64.9

## 2022-02-10 HISTORY — PX: AMPUTATION: SHX166

## 2022-02-10 HISTORY — DX: Cardiac arrhythmia, unspecified: I49.9

## 2022-02-10 HISTORY — DX: Other chronic osteomyelitis, right ankle and foot: M86.671

## 2022-02-10 SURGERY — AMPUTATION, FOOT, RAY
Anesthesia: Monitor Anesthesia Care | Site: Foot | Laterality: Right

## 2022-02-10 MED ORDER — MIDAZOLAM HCL 2 MG/2ML IJ SOLN: 2.0000 mg | Freq: Once | INTRAMUSCULAR | Status: AC

## 2022-02-10 MED ORDER — MIDAZOLAM HCL 5 MG/5ML IJ SOLN
INTRAMUSCULAR | Status: DC | PRN
  Administered 2022-02-10: 1 mg via INTRAVENOUS

## 2022-02-10 MED ORDER — FENTANYL CITRATE (PF) 250 MCG/5ML IJ SOLN
INTRAMUSCULAR | Status: DC | PRN
  Administered 2022-02-10 (×4): 50 ug via INTRAVENOUS

## 2022-02-10 MED ORDER — PROPOFOL 10 MG/ML IV BOLUS
INTRAVENOUS | Status: DC | PRN
  Administered 2022-02-10: 50 mg via INTRAVENOUS
  Administered 2022-02-10: 150 mg via INTRAVENOUS

## 2022-02-10 MED ORDER — FENTANYL CITRATE (PF) 100 MCG/2ML IJ SOLN: 100.0000 ug | Freq: Once | INTRAMUSCULAR | Status: AC

## 2022-02-10 MED ORDER — ONDANSETRON HCL 4 MG/2ML IJ SOLN
INTRAMUSCULAR | Status: AC
  Filled 2022-02-10: qty 2

## 2022-02-10 MED ORDER — MIDAZOLAM HCL 2 MG/2ML IJ SOLN
INTRAMUSCULAR | Status: AC
  Filled 2022-02-10: qty 2

## 2022-02-10 MED ORDER — FENTANYL CITRATE (PF) 100 MCG/2ML IJ SOLN
INTRAMUSCULAR | Status: AC
  Administered 2022-02-10: 100 ug via INTRAVENOUS
  Filled 2022-02-10: qty 2

## 2022-02-10 MED ORDER — HYDROMORPHONE HCL 1 MG/ML IJ SOLN: 0.2500 mg | INTRAMUSCULAR | Status: DC | PRN

## 2022-02-10 MED ORDER — CHLORHEXIDINE GLUCONATE 0.12 % MT SOLN
15.0000 mL | Freq: Once | OROMUCOSAL | Status: AC
  Administered 2022-02-10: 15 mL via OROMUCOSAL
  Filled 2022-02-10: qty 15

## 2022-02-10 MED ORDER — DEXMEDETOMIDINE HCL IN NACL 80 MCG/20ML IV SOLN
INTRAVENOUS | Status: DC | PRN
  Administered 2022-02-10: 12 ug via BUCCAL
  Administered 2022-02-10: 8 ug via BUCCAL

## 2022-02-10 MED ORDER — CEFAZOLIN SODIUM-DEXTROSE 2-4 GM/100ML-% IV SOLN
2.0000 g | INTRAVENOUS | Status: AC
  Administered 2022-02-10: 2 g via INTRAVENOUS
  Filled 2022-02-10: qty 100

## 2022-02-10 MED ORDER — ONDANSETRON HCL 4 MG/2ML IJ SOLN
INTRAMUSCULAR | Status: DC | PRN
  Administered 2022-02-10: 4 mg via INTRAVENOUS

## 2022-02-10 MED ORDER — DEXAMETHASONE SODIUM PHOSPHATE 10 MG/ML IJ SOLN
INTRAMUSCULAR | Status: DC | PRN
  Administered 2022-02-10: 5 mg via INTRAVENOUS

## 2022-02-10 MED ORDER — FENTANYL CITRATE (PF) 250 MCG/5ML IJ SOLN
INTRAMUSCULAR | Status: AC
  Filled 2022-02-10: qty 5

## 2022-02-10 MED ORDER — PROPOFOL 10 MG/ML IV BOLUS
INTRAVENOUS | Status: AC
  Filled 2022-02-10: qty 20

## 2022-02-10 MED ORDER — 0.9 % SODIUM CHLORIDE (POUR BTL) OPTIME
TOPICAL | Status: DC | PRN
  Administered 2022-02-10: 1000 mL

## 2022-02-10 MED ORDER — LACTATED RINGERS IV SOLN: INTRAVENOUS | Status: DC

## 2022-02-10 MED ORDER — LIDOCAINE 2% (20 MG/ML) 5 ML SYRINGE
INTRAMUSCULAR | Status: DC | PRN
  Administered 2022-02-10: 80 mg via INTRAVENOUS

## 2022-02-10 MED ORDER — DEXAMETHASONE SODIUM PHOSPHATE 10 MG/ML IJ SOLN
INTRAMUSCULAR | Status: AC
  Filled 2022-02-10: qty 1

## 2022-02-10 MED ORDER — METOPROLOL SUCCINATE ER 25 MG PO TB24
25.0000 mg | ORAL_TABLET | Freq: Every day | ORAL | Status: DC
  Administered 2022-02-10: 25 mg via ORAL
  Filled 2022-02-10: qty 1

## 2022-02-10 MED ORDER — DEXMEDETOMIDINE HCL IN NACL 80 MCG/20ML IV SOLN
INTRAVENOUS | Status: AC
  Filled 2022-02-10: qty 20

## 2022-02-10 MED ORDER — ORAL CARE MOUTH RINSE: 15.0000 mL | Freq: Once | OROMUCOSAL | Status: AC

## 2022-02-10 MED ORDER — BUPIVACAINE-EPINEPHRINE (PF) 0.5% -1:200000 IJ SOLN
INTRAMUSCULAR | Status: DC | PRN
  Administered 2022-02-10: 30 mL via PERINEURAL

## 2022-02-10 MED ORDER — MIDAZOLAM HCL 2 MG/2ML IJ SOLN
INTRAMUSCULAR | Status: AC
  Administered 2022-02-10: 2 mg via INTRAVENOUS
  Filled 2022-02-10: qty 2

## 2022-02-10 SURGICAL SUPPLY — 33 items
BAG COUNTER SPONGE SURGICOUNT (BAG) ×1 IMPLANT
BLADE AVERAGE 25X9 (BLADE) IMPLANT
BLADE SAW SGTL MED 73X18.5 STR (BLADE) IMPLANT
BLADE SURG 21 STRL SS (BLADE) ×1 IMPLANT
BNDG COHESIVE 4X5 TAN STRL (GAUZE/BANDAGES/DRESSINGS) ×1 IMPLANT
BNDG COHESIVE 4X5 TAN STRL LF (GAUZE/BANDAGES/DRESSINGS) IMPLANT
BNDG GAUZE DERMACEA FLUFF 4 (GAUZE/BANDAGES/DRESSINGS) ×1 IMPLANT
COVER SURGICAL LIGHT HANDLE (MISCELLANEOUS) ×2 IMPLANT
DRAPE DERMATAC (DRAPES) IMPLANT
DRAPE U-SHAPE 47X51 STRL (DRAPES) ×2 IMPLANT
DRESSING PEEL AND PLC PRVNA 13 (GAUZE/BANDAGES/DRESSINGS) IMPLANT
DRSG ADAPTIC 3X8 NADH LF (GAUZE/BANDAGES/DRESSINGS) ×1 IMPLANT
DRSG PEEL AND PLACE PREVENA 13 (GAUZE/BANDAGES/DRESSINGS) ×1
DURAPREP 26ML APPLICATOR (WOUND CARE) ×1 IMPLANT
ELECT REM PT RETURN 9FT ADLT (ELECTROSURGICAL) ×1
ELECTRODE REM PT RTRN 9FT ADLT (ELECTROSURGICAL) ×1 IMPLANT
GAUZE PAD ABD 8X10 STRL (GAUZE/BANDAGES/DRESSINGS) ×2 IMPLANT
GAUZE SPONGE 4X4 12PLY STRL (GAUZE/BANDAGES/DRESSINGS) ×1 IMPLANT
GLOVE BIOGEL PI IND STRL 9 (GLOVE) ×1 IMPLANT
GLOVE SURG ORTHO 9.0 STRL STRW (GLOVE) ×1 IMPLANT
GOWN STRL REUS W/ TWL XL LVL3 (GOWN DISPOSABLE) ×2 IMPLANT
GOWN STRL REUS W/TWL XL LVL3 (GOWN DISPOSABLE) ×2
GRAFT SKIN WND MICRO 38 (Tissue) IMPLANT
KIT BASIN OR (CUSTOM PROCEDURE TRAY) ×1 IMPLANT
KIT TURNOVER KIT B (KITS) ×1 IMPLANT
NS IRRIG 1000ML POUR BTL (IV SOLUTION) ×1 IMPLANT
PACK ORTHO EXTREMITY (CUSTOM PROCEDURE TRAY) ×1 IMPLANT
PAD ARMBOARD 7.5X6 YLW CONV (MISCELLANEOUS) ×2 IMPLANT
STOCKINETTE IMPERVIOUS LG (DRAPES) IMPLANT
SUT ETHILON 2 0 PSLX (SUTURE) ×1 IMPLANT
TOWEL GREEN STERILE (TOWEL DISPOSABLE) ×1 IMPLANT
TUBE CONNECTING 12X1/4 (SUCTIONS) ×1 IMPLANT
YANKAUER SUCT BULB TIP NO VENT (SUCTIONS) ×1 IMPLANT

## 2022-02-10 NOTE — Anesthesia Preprocedure Evaluation (Addendum)
Anesthesia Evaluation  Patient identified by MRN, date of birth, ID band Patient awake    Reviewed: Allergy & Precautions, NPO status , Patient's Chart, lab work & pertinent test results  Airway Mallampati: II       Dental   Pulmonary    breath sounds clear to auscultation       Cardiovascular hypertension, +CHF  + dysrhythmias  Rhythm:Regular Rate:Normal     Neuro/Psych    GI/Hepatic negative GI ROS, Neg liver ROS,,,  Endo/Other  negative endocrine ROS    Renal/GU negative Renal ROS     Musculoskeletal  (+) Arthritis ,    Abdominal   Peds  Hematology   Anesthesia Other Findings   Reproductive/Obstetrics                             Anesthesia Physical Anesthesia Plan  ASA: 3  Anesthesia Plan: MAC and General   Post-op Pain Management: Tylenol PO (pre-op)*   Induction:   PONV Risk Score and Plan: 3 and Ondansetron, Dexamethasone and Midazolam  Airway Management Planned: Oral ETT, Nasal Cannula and Simple Face Mask  Additional Equipment:   Intra-op Plan:   Post-operative Plan:   Informed Consent: I have reviewed the patients History and Physical, chart, labs and discussed the procedure including the risks, benefits and alternatives for the proposed anesthesia with the patient or authorized representative who has indicated his/her understanding and acceptance.     Dental advisory given  Plan Discussed with: CRNA and Anesthesiologist  Anesthesia Plan Comments:        Anesthesia Quick Evaluation

## 2022-02-10 NOTE — Anesthesia Procedure Notes (Signed)
Anesthesia Regional Block: Popliteal block   Pre-Anesthetic Checklist: , timeout performed,  Correct Patient, Correct Site, Correct Laterality,  Correct Procedure, Correct Position, site marked,  Risks and benefits discussed,  Surgical consent,  Pre-op evaluation,  At surgeon's request and post-op pain management  Laterality: Right  Prep: Dura Prep       Needles:  Injection technique: Single-shot  Needle Type: Echogenic Stimulator Needle     Needle Length: 10cm  Needle Gauge: 20     Additional Needles:   Procedures:,,,, ultrasound used (permanent image in chart),,    Narrative:  Start time: 02/10/2022 9:20 AM End time: 02/10/2022 9:24 AM Injection made incrementally with aspirations every 5 mL.  Performed by: Personally  Anesthesiologist: Darral Dash, DO  Additional Notes: Patient identified. Risks/Benefits/Options discussed with patient including but not limited to bleeding, infection, nerve damage, failed block, incomplete pain control. Patient expressed understanding and wished to proceed. All questions were answered. Sterile technique was used throughout the entire procedure. Please see nursing notes for vital signs. Aspirated in 5cc intervals with injection for negative confirmation. Patient was given instructions on fall risk and not to get out of bed. All questions and concerns addressed with instructions to call with any issues or inadequate analgesia.

## 2022-02-10 NOTE — Anesthesia Procedure Notes (Signed)
Procedure Name: LMA Insertion Date/Time: 02/10/2022 9:49 AM  Performed by: Colin Benton, CRNAPre-anesthesia Checklist: Patient identified, Emergency Drugs available, Suction available and Patient being monitored Patient Re-evaluated:Patient Re-evaluated prior to induction Oxygen Delivery Method: Circle System Utilized Preoxygenation: Pre-oxygenation with 100% oxygen Induction Type: IV induction Ventilation: Mask ventilation without difficulty LMA: LMA inserted LMA Size: 5.0 Number of attempts: 1 Placement Confirmation: positive ETCO2 and breath sounds checked- equal and bilateral Tube secured with: Tape Dental Injury: Teeth and Oropharynx as per pre-operative assessment

## 2022-02-10 NOTE — Transfer of Care (Signed)
Immediate Anesthesia Transfer of Care Note  Patient: Thomas [name]  Procedure(s) Performed: RIGHT TRANSMETATARSAL AMPUTATION (Right: Foot)  Patient Location: PACU  Anesthesia Type:GA combined with regional for post-op pain  Level of Consciousness: awake, alert , oriented, and patient cooperative  Airway & Oxygen Therapy: Patient Spontanous Breathing and Patient connected to nasal cannula oxygen  Post-op Assessment: Report given to RN, Post -op Vital signs reviewed and stable, and Patient moving all extremities X 4  Post vital signs: Reviewed and stable  Last Vitals:  Vitals Value Taken Time  BP 176/150 02/10/22 1045  Temp    Pulse 73 02/10/22 1051  Resp 24 02/10/22 1051  SpO2 100 % 02/10/22 1051  Vitals shown include unvalidated device data.  Last Pain:  Vitals:   02/10/22 0725  PainSc: 9       Patients Stated Pain Goal: 2 (68/37/29 0211)  Complications: No notable events documented.

## 2022-02-10 NOTE — H&P (Signed)
Thomas [name] is an 71 y.o. male.   Chief Complaint: Ulceration osteomyelitis right foot HPI: Patient is a 71 year old gentleman who is seen for initial evaluation for osteomyelitis right forefoot.  Patient has rheumatoid arthritis and is scheduled for total hip arthroplasty with Dr. Zachery Dakins.  Patient presents with osteomyelitis of the toes of the right foot.  Patient states he is off antibiotics at this time.  He has rheumatoid arthritis no history of gout and is on methotrexate.   Past Medical History:  Diagnosis Date   Anemia    Atrial flutter ([place]) 01/2020   Cancer ([place])    prostate   COVID 2021   mild case   Dysrhythmia    Aflutter   Heart failure with reduced ejection fraction (HCC)    Hypertension    Hypoglycemia    occ   NICM (nonischemic cardiomyopathy) ([place]) 02/12/2020   Prostate cancer ([place]) 2019   Rheumatoid arthritis [facility])     Past Surgical History:  Procedure Laterality Date   AMPUTATION TOE Right 03/26/2020   Procedure: Right 3rd toe partial amputation, bone biopsy right 1st metatarsal;  Surgeon: Thomas [name], DPM;  [facility];  Service: Podiatry;  Laterality: Right;   BUBBLE STUDY  02/11/2020   Procedure: BUBBLE STUDY;  Surgeon: Thomas Rile, MD;  Location: [place];  Service: Cardiovascular;;   CARDIOVERSION N/A 02/11/2020   Procedure: CARDIOVERSION;  Surgeon: Thomas Rile, MD;  Location: [place];  Service: Cardiovascular;  Laterality: N/A;   CARDIOVERSION N/A 08/31/2021   Procedure: CARDIOVERSION;  Surgeon: Thomas Pain, MD;  Location: [facility] ENDOSCOPY;  Service: Cardiovascular;  Laterality: N/A;   CYSTOSCOPY  04/11/2018   Procedure: Erlene Quan;  Surgeon: Thomas Hughs, MD;  Location: [facility];  Service: Urology;;  NO SEEDS FOUND IN BLADDER   HERNIA REPAIR  2009   inguinal   INTRAMEDULLARY (IM) NAIL INTERTROCHANTERIC Right 05/05/2020   Procedure: CPT 27245-Cephalomedullary nailing of right  intertrochanteric femur fracture;  Surgeon: Shona Needles, MD;  Location: [place];  Service: Orthopedics;  Laterality: Right;   INTRAMEDULLARY (IM) NAIL INTERTROCHANTERIC Right 12/24/2020   Procedure: REVISION FIXATION OF RIGHT INTERTROCHANTERIC FEMUR NONUNION;  Surgeon: Shona Needles, MD;  Location: [place];  Service: Orthopedics;  Laterality: Right;   IRRIGATION AND DEBRIDEMENT FOOT Left 03/26/2020   Procedure: Left foot incision and drainage with removal of all non-viable soft tissue and bone - areas overlying the 2nd and 5th metatarsals.;  Surgeon: Thomas [name], DPM;  [facility];  Service: Podiatry;  Laterality: Left;   RADIOACTIVE SEED IMPLANT N/A 04/11/2018   Procedure: RADIOACTIVE SEED IMPLANT/BRACHYTHERAPY IMPLANT;  Surgeon: Thomas Hughs, MD;  Location: [facility];  Service: Urology;  Laterality: N/A;   53     SEEDS IMPLANTED   RIGHT/LEFT HEART CATH AND CORONARY ANGIOGRAPHY N/A 02/09/2020   Procedure: RIGHT/LEFT HEART CATH AND CORONARY ANGIOGRAPHY;  Surgeon: Thomas Bush, MD;  Location: [place] [facility];  Service: Cardiovascular;  Laterality: N/A;   SPACE OAR INSTILLATION N/A 04/11/2018   Procedure: SPACE OAR INSTILLATION;  Surgeon: Thomas Hughs, MD;  Location: [facility];  Service: Urology;  Laterality: N/A;   TEE WITHOUT CARDIOVERSION N/A 02/11/2020   Procedure: TRANSESOPHAGEAL ECHOCARDIOGRAM (TEE);  Surgeon: Thomas Rile, MD;  Location: [place];  Service: Cardiovascular;  Laterality: N/A;   TOTAL KNEE ARTHROPLASTY Bilateral 09/13/2015   Procedure: BILATERAL KNEE ARTHROPLASTY ;  Surgeon: Thomas Cancel, MD;  Location: [facility];  Service: Orthopedics;  Laterality: Bilateral;    Family History  Problem Relation Age of Onset   Prostate cancer Father    Prostate cancer Brother    Colon cancer Neg Hx    Rectal cancer Neg Hx    Stomach cancer Neg Hx    Social History:  reports that he has never smoked. He has never  used smokeless tobacco. He reports that he does not currently use alcohol. He reports that he does not currently use drugs.  Allergies:  Allergies  Allergen Reactions   Cefadroxil Hives and Palpitations    EMS to ED from [facility].   Diltiazem Hcl Swelling   Chlorthalidone Other (See Comments)    "Makes me light-headed and I don't like the way it makes me feel"   Voltaren [Diclofenac] Rash    No medications prior to admission.    No results found for this or any previous visit (from the past 48 hour(s)). No results found.  Review of Systems  All other systems reviewed and are negative.   There were no vitals taken for this visit. Physical Exam  Patient is alert, oriented, no adenopathy, well-dressed, normal affect, normal respiratory effort. Examination patient has a strong dorsalis pedis pulse previous amputation of the great toe with partial amputation of the third and fourth toes.  He has severe clawing of the second toe with no open ulcers.  Review of the MRI scan shows osteomyelitis of the proximal phalanx of the third toe as well as edema in the fifth toe consistent with early osteomyelitis.  Patient has an ulcer over the third toe that probes to bone.  There is no ascending cellulitis.  Patient's hemoglobin A1c 2 years ago was 6.2. Assessment/Plan 1. Osteomyelitis of third toe of right foot ([place])       Plan: With the infection of the third and fifth toe with severe clawing of the second toe and status post great toe amputation recommended proceeding with a transmetatarsal amputation.  Risk and benefits were discussed including risk of the wound not healing.  Patient states he understands wished to proceed at this time.  We will plan for surgery tomorrow this should give him the best chance of healing prior to his hip surgery.  Thomas Minion, MD 02/10/2022, 6:44 AM

## 2022-02-10 NOTE — Anesthesia Postprocedure Evaluation (Signed)
Anesthesia Post Note  Patient: AMARIYON MAYNES  Procedure(s) Performed: RIGHT TRANSMETATARSAL AMPUTATION (Right: Foot)     Patient location during evaluation: PACU Anesthesia Type: General Level of consciousness: awake Pain management: pain level controlled Vital Signs Assessment: post-procedure vital signs reviewed and stable Respiratory status: spontaneous breathing Cardiovascular status: stable Postop Assessment: no apparent nausea or vomiting Anesthetic complications: no   No notable events documented.  Last Vitals:  Vitals:   02/10/22 1115 02/10/22 1130  BP: (!) 177/89 (!) 151/84  Pulse: 67 63  Resp: 17 16  Temp:  36.4 C  SpO2: 98% 100%    Last Pain:  Vitals:   02/10/22 1130  PainSc: 0-No pain                 Chastidy Ranker

## 2022-02-10 NOTE — Op Note (Signed)
02/10/2022  11:41 AM  PATIENT:  Thomas [name]    PRE-OPERATIVE DIAGNOSIS:  Osteomyelitis Toes Right Foot  POST-OPERATIVE DIAGNOSIS:  Same  PROCEDURE:  RIGHT TRANSMETATARSAL AMPUTATION Application Kerecis micro powder 38 cm. Application of 13 cm Prevena wound VAC.  SURGEON:  Newt Minion, MD  PHYSICIAN ASSISTANT:None ANESTHESIA:   General  PREOPERATIVE INDICATIONS:  Thomas [name] is a  71 y.o. male with a diagnosis of Osteomyelitis Toes Right Foot who failed conservative measures and elected for surgical management.    The risks benefits and alternatives were discussed with the patient preoperatively including but not limited to the risks of infection, bleeding, nerve injury, cardiopulmonary complications, the need for revision surgery, among others, and the patient was willing to proceed.  OPERATIVE IMPLANTS: Kerecis micro powder 38 cm.  '@ENCIMAGES'$ @  OPERATIVE FINDINGS: Patient had good petechial bleeding.  Wound edges closed without tension.  OPERATIVE PROCEDURE: Patient was brought the operating room after undergoing a regional anesthetic.  After adequate levels anesthesia were obtained patient's right lower extremity was prepped using DuraPrep draped into a sterile field a timeout was called.  A fishmouth incision was made just proximal to the ulcerative tissue this was carried down to bone.  The forefoot was amputated through the MTP joints.  Electrocautery was used for hemostasis the wound was irrigated with normal saline.  The wound bed was filled with 38 cm of Kerecis micro powder for wound surface area 50 cm.  The incision was closed using 2-0 nylon a 13 cm Prevena wound VAC was applied this had a good suction fit patient was taken the PACU in stable condition.   DISCHARGE PLANNING:  Antibiotic duration: Preoperative antibiotics  Weightbearing: Touchdown weightbearing on the right  Pain medication: Continue his oxycodone 30 mg  Dressing care/ Wound VAC:  Prevena wound VAC continue for 1 week  Ambulatory devices: Walker or crutches  Discharge to: Home.  Follow-up: In the office 1 week post operative.

## 2022-02-13 ENCOUNTER — Encounter (HOSPITAL_COMMUNITY): Payer: Self-pay | Admitting: Orthopedic Surgery

## 2022-02-14 ENCOUNTER — Telehealth: Payer: Self-pay | Admitting: Family

## 2022-02-14 NOTE — Telephone Encounter (Signed)
Patient states he has called April because she told him there is something he needs to do to his canster that goes into the machine that will make the machine  last over night until the morning at his appointment but he does not know what to do please advise. [phone]

## 2022-02-14 NOTE — Telephone Encounter (Signed)
Called pt back. No answer, just rings. Was going to advise him to turn off his wound vac completely until his appt tomorrow morning.

## 2022-02-15 ENCOUNTER — Encounter: Payer: Self-pay | Admitting: Family

## 2022-02-15 ENCOUNTER — Inpatient Hospital Stay (HOSPITAL_COMMUNITY): Admission: RE | Admit: 2022-02-15 | Payer: Medicare Other | Source: Home / Self Care | Admitting: Orthopedic Surgery

## 2022-02-15 ENCOUNTER — Ambulatory Visit (INDEPENDENT_AMBULATORY_CARE_PROVIDER_SITE_OTHER): Payer: Medicare Other | Admitting: Family

## 2022-02-15 ENCOUNTER — Encounter (HOSPITAL_COMMUNITY): Admission: RE | Payer: Self-pay | Source: Home / Self Care

## 2022-02-15 DIAGNOSIS — I10 Essential (primary) hypertension: Secondary | ICD-10-CM

## 2022-02-15 DIAGNOSIS — Z01818 Encounter for other preprocedural examination: Secondary | ICD-10-CM

## 2022-02-15 DIAGNOSIS — Z89431 Acquired absence of right foot: Secondary | ICD-10-CM

## 2022-02-15 DIAGNOSIS — Z89439 Acquired absence of unspecified foot: Secondary | ICD-10-CM | POA: Insufficient documentation

## 2022-02-15 SURGERY — CONVERSION, PREVIOUS HIP SURGERY, TO TOTAL HIP ARTHROPLASTY
Anesthesia: Spinal | Site: Hip | Laterality: Right

## 2022-02-15 NOTE — Progress Notes (Signed)
Post-Op Visit Note   Patient: Thomas [name]           Date of Birth: [birth year]           MRN: [id number] Visit Date: 02/15/2022 PCP: [name], MD  Chief Complaint:  Chief Complaint  Patient presents with   Right Foot - Routine Post Op    02/10/2022 right transmet amputation kerecis graft D/c wound vac today     HPI:  HPI The patient is a 71 year old gentleman seen status post right transmetatarsal amputation.  Wound VAC removed today. Ortho Exam On examination of the right foot he does have maceration the length of his incision there is no erythema no warmth no dehiscence.  Sutures in place.  Visit Diagnoses: No diagnosis found.  Plan: Begin daily Dial soap cleansing.  Dry dressings.  Minimize weightbearing he will follow-up in 2 weeks consider suture removal.  Follow-Up Instructions: No follow-ups on file.   Imaging: No results found.  Orders:  No orders of the defined types were placed in this encounter.  No orders of the defined types were placed in this encounter.    PMFS History: Patient Active Problem List   Diagnosis Date Noted   Chronic osteomyelitis of toe, right ([place]) 02/10/2022   Fracture of intertrochanteric section of femur, closed, right, with nonunion, subsequent encounter 12/24/2020   Inguinal hernia 10/18/2020   Chronic combined systolic and diastolic CHF, NYHA class 2 ([place])    Osteoporosis 05/05/2020   Foot infection    NICM (nonischemic cardiomyopathy) ([place]), no significant CAD on cardiac cath   02/12/2020   Right foot pain    Rheumatoid arthritis ([place])    Atrial flutter ([place]) 02/02/2020   Lower extremity edema 12/30/2019   Weight loss, non-intentional 11/24/2019   Chronic right hip pain 06/19/2019   Degenerative tear of acetabular labrum of right hip 05/02/2019   Primary osteoarthritis of right hip 05/02/2019   Skin ulcer ([place]) 01/29/2019   Malignant neoplasm of prostate ([place]) 01/16/2018   Insomnia 10/02/2017   S/P  bilateral TKA 09/13/2015   Other bilateral secondary osteoarthritis of knee 07/14/2014   Heme positive stool 05/21/2013   Osteoarthritis, multiple sites 01/13/2011   Hypertension 12/06/2010   CLAUSTROPHOBIA 03/01/2010   Past Medical History:  Diagnosis Date   Anemia    Atrial flutter ([place]) 01/2020   Cancer ([place])    prostate   COVID 2021   mild case   Dysrhythmia    Aflutter   Heart failure with reduced ejection fraction (HCC)    Hypertension    Hypoglycemia    occ   NICM (nonischemic cardiomyopathy) ([place]) 02/12/2020   Prostate cancer ([place]) 2019   Rheumatoid arthritis ([place])     Family History  Problem Relation Age of Onset   Prostate cancer Father    Prostate cancer Brother    Colon cancer Neg Hx    Rectal cancer Neg Hx    Stomach cancer Neg Hx     Past Surgical History:  Procedure Laterality Date   AMPUTATION Right 02/10/2022   Procedure: RIGHT TRANSMETATARSAL AMPUTATION;  Surgeon: Newt Minion, MD;  Location: [place];  Service: Orthopedics;  Laterality: Right;   AMPUTATION TOE Right 03/26/2020   Procedure: Right 3rd toe partial amputation, bone biopsy right 1st metatarsal;  Surgeon: Evelina Bucy, DPM;  [facility];  Service: Podiatry;  Laterality: Right;   BUBBLE STUDY  02/11/2020   Procedure: BUBBLE STUDY;  Surgeon: Geralynn Rile, MD;  Location: [place];  Service: Cardiovascular;;   CARDIOVERSION N/A 02/11/2020   Procedure: CARDIOVERSION;  Surgeon: Geralynn Rile, MD;  Location: [place];  Service: Cardiovascular;  Laterality: N/A;   CARDIOVERSION N/A 08/31/2021   Procedure: CARDIOVERSION;  Surgeon: Jerline Pain, MD;  Location: [facility] ENDOSCOPY;  Service: Cardiovascular;  Laterality: N/A;   CYSTOSCOPY  04/11/2018   Procedure: Erlene Quan;  Surgeon: [name], MD;  Location: [facility];  Service: Urology;;  NO SEEDS FOUND IN BLADDER   HERNIA REPAIR  2009   inguinal   INTRAMEDULLARY (IM) NAIL  INTERTROCHANTERIC Right 05/05/2020   Procedure: CPT 27245-Cephalomedullary nailing of right intertrochanteric femur fracture;  Surgeon: Shona Needles, MD;  Location: [place];  Service: Orthopedics;  Laterality: Right;   INTRAMEDULLARY (IM) NAIL INTERTROCHANTERIC Right 12/24/2020   Procedure: REVISION FIXATION OF RIGHT INTERTROCHANTERIC FEMUR NONUNION;  Surgeon: Shona Needles, MD;  Location: [place];  Service: Orthopedics;  Laterality: Right;   IRRIGATION AND DEBRIDEMENT FOOT Left 03/26/2020   Procedure: Left foot incision and drainage with removal of all non-viable soft tissue and bone - areas overlying the 2nd and 5th metatarsals.;  Surgeon: Evelina Bucy, DPM;  [facility];  Service: Podiatry;  Laterality: Left;   RADIOACTIVE SEED IMPLANT N/A 04/11/2018   Procedure: RADIOACTIVE SEED IMPLANT/BRACHYTHERAPY IMPLANT;  Surgeon: [name], MD;  Location: [facility];  Service: Urology;  Laterality: N/A;   60     SEEDS IMPLANTED   RIGHT/LEFT HEART CATH AND CORONARY ANGIOGRAPHY N/A 02/09/2020   Procedure: RIGHT/LEFT HEART CATH AND CORONARY ANGIOGRAPHY;  Surgeon: Nelva Bush, MD;  Location: [place] [facility];  Service: Cardiovascular;  Laterality: N/A;   SPACE OAR INSTILLATION N/A 04/11/2018   Procedure: SPACE OAR INSTILLATION;  Surgeon: [name], MD;  Location: [facility];  Service: Urology;  Laterality: N/A;   TEE WITHOUT CARDIOVERSION N/A 02/11/2020   Procedure: TRANSESOPHAGEAL ECHOCARDIOGRAM (TEE);  Surgeon: Geralynn Rile, MD;  Location: [place];  Service: Cardiovascular;  Laterality: N/A;   TOTAL KNEE ARTHROPLASTY Bilateral 09/13/2015   Procedure: BILATERAL KNEE ARTHROPLASTY ;  Surgeon: Paralee Cancel, MD;  [facility];  Service: Orthopedics;  Laterality: Bilateral;   Social History   Occupational History   Not on file  Tobacco Use   Smoking status: Never   Smokeless tobacco: Never  Vaping Use   Vaping Use: Never  used  Substance and Sexual Activity   Alcohol use: Not Currently    Comment: 2 beers a week   Drug use: Not Currently   Sexual activity: Yes

## 2022-02-15 NOTE — Telephone Encounter (Signed)
Pt came today for his appointment.

## 2022-02-17 ENCOUNTER — Encounter: Payer: Medicare Other | Admitting: Family

## 2022-02-20 ENCOUNTER — Telehealth: Payer: Self-pay | Admitting: Orthopedic Surgery

## 2022-02-20 ENCOUNTER — Ambulatory Visit (INDEPENDENT_AMBULATORY_CARE_PROVIDER_SITE_OTHER): Payer: Medicare Other | Admitting: Orthopedic Surgery

## 2022-02-20 DIAGNOSIS — Z89431 Acquired absence of right foot: Secondary | ICD-10-CM

## 2022-02-20 MED ORDER — DOXYCYCLINE HYCLATE 100 MG PO TABS: 100.0000 mg | ORAL_TABLET | Freq: Two times a day (BID) | ORAL | 0 refills | Status: DC

## 2022-02-20 NOTE — Telephone Encounter (Signed)
Called and lm on vm to advise of message below.

## 2022-02-20 NOTE — Telephone Encounter (Signed)
Pt called in stating that Dr. Sharol Given was suppose to send over antibiotic to pt preferred pharmacy.... Pt stated that the pharmacy advised him no script was called in or sent over. Pt requesting callback

## 2022-02-20 NOTE — Telephone Encounter (Signed)
Pt comming in today at 2pm

## 2022-02-20 NOTE — Telephone Encounter (Signed)
Pt called requesting a appt today. He is a post op pt and he states he imputed toes are double in size, swollen and severe pain. Please call pt at [phone].

## 2022-02-20 NOTE — Telephone Encounter (Signed)
Please see below and advise.

## 2022-02-21 ENCOUNTER — Encounter: Payer: Self-pay | Admitting: Orthopedic Surgery

## 2022-02-21 NOTE — Progress Notes (Signed)
Office Visit Note   Patient: Thomas [name]           Date of Birth: [birth year]           MRN: [id number] Visit Date: 02/20/2022              Requested by: [name], MD [address] [place],  [place] [address] PCP: [name], MD  Chief Complaint  Patient presents with   Right Foot - Routine Post Op    02/10/22 right transmet amputation kerecis graft       HPI: Patient is a 71 year old gentleman who presents 10 days status post right transmetatarsal amputation with application of Kerecis graft.  Patient has been full weightbearing in a postoperative shoe he complains of increased swelling and drainage and wound dehiscence.  Patient states he has not been elevating his foot.  Assessment & Plan: Visit Diagnoses:  1. History of transmetatarsal amputation of right foot ([place])     Plan: Discussed the importance of elevation and nonweightbearing.  Start washing with soap and water and dry dressing change daily.  A prescription for doxycycline was called and originally was going to call and Bactrim DS however this had a cross sensitivity with his methotrexate.  Follow-Up Instructions: Return in about 1 week (around 02/27/2022).   Ortho Exam  Patient is alert, oriented, no adenopathy, well-dressed, normal affect, normal respiratory effort. Examination there is wound dehiscence and cellulitis.  There is clear drainage and maceration.  Imaging: No results found. No images are attached to the encounter.  Labs: Lab Results  Component Value Date   HGBA1C 6.2 (H) 02/03/2020   HGBA1C 5.3 10/28/2015   HGBA1C 5.6 08/10/2014   ESRSEDRATE 14 06/21/2021   ESRSEDRATE 17 05/18/2021   ESRSEDRATE 20 (H) 03/28/2020   CRP 19.2 (H) 06/21/2021   CRP 11.1 (H) 05/18/2021   CRP 2.7 (H) 03/28/2020   REPTSTATUS 03/31/2020 FINAL 03/26/2020   GRAMSTAIN NO WBC SEEN NO ORGANISMS SEEN  03/26/2020   CULT  03/26/2020    No growth aerobically or  anaerobically. Performed at [facility], [place] [address], [place],  [address]    LABORGA STAPHYLOCOCCUS AUREUS 03/26/2020     Lab Results  Component Value Date   ALBUMIN 3.0 (L) 03/26/2020   ALBUMIN 3.6 03/25/2020   ALBUMIN 4.1 03/24/2020    Lab Results  Component Value Date   MG 1.8 05/22/2021   MG 2.0 02/12/2020   MG 1.7 02/11/2020   Lab Results  Component Value Date   VD25OH 28.61 (L) 05/05/2020    No results found for: "PREALBUMIN"    Latest Ref Rng & Units 02/03/2022    2:24 PM 08/26/2021   10:15 AM 08/02/2021    9:35 AM  CBC EXTENDED  WBC 4.0 - 10.5 K/uL 10.6  9.0  6.6   RBC 4.22 - 5.81 MIL/uL 4.84  4.89  4.58   Hemoglobin 13.0 - 17.0 g/dL 11.7  12.1  11.2   HCT 39.0 - 52.0 % 39.6  38.6  35.9   Platelets 150 - 400 K/uL 311  337  343      There is no height or weight on file to calculate BMI.  Orders:  No orders of the defined types were placed in this encounter.  Meds ordered this encounter  Medications   doxycycline (VIBRA-TABS) 100 MG tablet    Sig: Take 1 tablet (100 mg total) by mouth 2 (two) times daily.  Dispense:  60 tablet    Refill:  0     Procedures: No procedures performed  Clinical Data: No additional findings.  ROS:  All other systems negative, except as noted in the HPI. Review of Systems  Objective: Vital Signs: There were no vitals taken for this visit.  Specialty Comments:  No specialty comments available.  PMFS History: Patient Active Problem List   Diagnosis Date Noted   History of transmetatarsal amputation of right foot ([place]) 02/15/2022   Chronic osteomyelitis of toe, right ([place]) 02/10/2022   Fracture of intertrochanteric section of femur, closed, right, with nonunion, subsequent encounter 12/24/2020   Inguinal hernia 10/18/2020   Chronic combined systolic and diastolic CHF, NYHA class 2 ([place])    Osteoporosis 05/05/2020   Foot infection    NICM (nonischemic cardiomyopathy) ([place]), no significant CAD  on cardiac cath   02/12/2020   Right foot pain    Rheumatoid arthritis ([place])    Atrial flutter ([place]) 02/02/2020   Lower extremity edema 12/30/2019   Weight loss, non-intentional 11/24/2019   Chronic right hip pain 06/19/2019   Degenerative tear of acetabular labrum of right hip 05/02/2019   Primary osteoarthritis of right hip 05/02/2019   Skin ulcer ([place]) 01/29/2019   Malignant neoplasm of prostate ([place]) 01/16/2018   Insomnia 10/02/2017   S/P bilateral TKA 09/13/2015   Other bilateral secondary osteoarthritis of knee 07/14/2014   Heme positive stool 05/21/2013   Osteoarthritis, multiple sites 01/13/2011   Hypertension 12/06/2010   CLAUSTROPHOBIA 03/01/2010   Past Medical History:  Diagnosis Date   Anemia    Atrial flutter ([place]) 01/2020   Cancer ([place])    prostate   COVID 2021   mild case   Dysrhythmia    Aflutter   Heart failure with reduced ejection fraction (HCC)    Hypertension    Hypoglycemia    occ   NICM (nonischemic cardiomyopathy) ([place]) 02/12/2020   Prostate cancer ([place]) 2019   Rheumatoid arthritis ([place])     Family History  Problem Relation Age of Onset   Prostate cancer Father    Prostate cancer Brother    Colon cancer Neg Hx    Rectal cancer Neg Hx    Stomach cancer Neg Hx     Past Surgical History:  Procedure Laterality Date   AMPUTATION Right 02/10/2022   Procedure: RIGHT TRANSMETATARSAL AMPUTATION;  Surgeon: Newt Minion, MD;  Location: [place];  Service: Orthopedics;  Laterality: Right;   AMPUTATION TOE Right 03/26/2020   Procedure: Right 3rd toe partial amputation, bone biopsy right 1st metatarsal;  Surgeon: Evelina Bucy, DPM;  [facility];  Service: Podiatry;  Laterality: Right;   BUBBLE STUDY  02/11/2020   Procedure: BUBBLE STUDY;  Surgeon: Geralynn Rile, MD;  Location: [place];  Service: Cardiovascular;;   CARDIOVERSION N/A 02/11/2020   Procedure: CARDIOVERSION;  Surgeon: Geralynn Rile, MD;  Location: [place];   Service: Cardiovascular;  Laterality: N/A;   CARDIOVERSION N/A 08/31/2021   Procedure: CARDIOVERSION;  Surgeon: Jerline Pain, MD;  Location: [facility] ENDOSCOPY;  Service: Cardiovascular;  Laterality: N/A;   CYSTOSCOPY  04/11/2018   Procedure: Erlene Quan;  Surgeon: [name], MD;  Location: [facility];  Service: Urology;;  NO SEEDS FOUND IN BLADDER   HERNIA REPAIR  2009   inguinal   INTRAMEDULLARY (IM) NAIL INTERTROCHANTERIC Right 05/05/2020   Procedure: CPT 27245-Cephalomedullary nailing of right intertrochanteric femur fracture;  Surgeon: Shona Needles, MD;  Location: [place];  Service: Orthopedics;  Laterality: Right;   INTRAMEDULLARY (IM) NAIL INTERTROCHANTERIC Right 12/24/2020   Procedure: REVISION FIXATION OF RIGHT INTERTROCHANTERIC FEMUR NONUNION;  Surgeon: Shona Needles, MD;  Location: [place];  Service: Orthopedics;  Laterality: Right;   IRRIGATION AND DEBRIDEMENT FOOT Left 03/26/2020   Procedure: Left foot incision and drainage with removal of all non-viable soft tissue and bone - areas overlying the 2nd and 5th metatarsals.;  Surgeon: Evelina Bucy, DPM;  [facility];  Service: Podiatry;  Laterality: Left;   RADIOACTIVE SEED IMPLANT N/A 04/11/2018   Procedure: RADIOACTIVE SEED IMPLANT/BRACHYTHERAPY IMPLANT;  Surgeon: [name], MD;  Location: [facility];  Service: Urology;  Laterality: N/A;   19     SEEDS IMPLANTED   RIGHT/LEFT HEART CATH AND CORONARY ANGIOGRAPHY N/A 02/09/2020   Procedure: RIGHT/LEFT HEART CATH AND CORONARY ANGIOGRAPHY;  Surgeon: Nelva Bush, MD;  Location: [place] [facility];  Service: Cardiovascular;  Laterality: N/A;   SPACE OAR INSTILLATION N/A 04/11/2018   Procedure: SPACE OAR INSTILLATION;  Surgeon: [name], MD;  Location: [facility];  Service: Urology;  Laterality: N/A;   TEE WITHOUT CARDIOVERSION N/A 02/11/2020   Procedure: TRANSESOPHAGEAL ECHOCARDIOGRAM (TEE);   Surgeon: Geralynn Rile, MD;  Location: [place];  Service: Cardiovascular;  Laterality: N/A;   TOTAL KNEE ARTHROPLASTY Bilateral 09/13/2015   Procedure: BILATERAL KNEE ARTHROPLASTY ;  Surgeon: Paralee Cancel, MD;  [facility];  Service: Orthopedics;  Laterality: Bilateral;   Social History   Occupational History   Not on file  Tobacco Use   Smoking status: Never   Smokeless tobacco: Never  Vaping Use   Vaping Use: Never used  Substance and Sexual Activity   Alcohol use: Not Currently    Comment: 2 beers a week   Drug use: Not Currently   Sexual activity: Yes

## 2022-02-23 NOTE — Telephone Encounter (Signed)
I called and sw pt. He is sp a right transmet amp 02/10/22. He just wanted to know how many times a day he should be changing his dressing. I advised once a day. He also wanted to know what s/s to watch out for. I advised andy increased drainage, swelling, warmth and redness andy odor, chills, decreased appetite could all be signs of infection and that he should call the office right away. He states that he has not has any of these s/s and that he will call if he does. He does have a follow up appt on Monday and will keep this as sch.

## 2022-02-23 NOTE — Telephone Encounter (Signed)
Please call patient he want to speak to someone about his bandage..[phone]

## 2022-02-27 ENCOUNTER — Ambulatory Visit (INDEPENDENT_AMBULATORY_CARE_PROVIDER_SITE_OTHER): Payer: Medicare Other | Admitting: Orthopedic Surgery

## 2022-02-27 ENCOUNTER — Encounter: Payer: Self-pay | Admitting: Orthopedic Surgery

## 2022-02-27 DIAGNOSIS — Z89431 Acquired absence of right foot: Secondary | ICD-10-CM

## 2022-02-27 NOTE — Progress Notes (Signed)
Office Visit Note   Patient: Thomas [name]           Date of Birth: 09-23-1950           MRN: [id number] Visit Date: 02/27/2022              Requested by: [name], MD [address] [place],  [place] [address] PCP: [name], MD  Chief Complaint  Patient presents with   Right Foot - Routine Post Op    02/10/2022 right transmet amputation with graft       HPI: Patient is a 71 year old gentleman who is 2 weeks status post transmetatarsal amputation with Kerecis tissue graft.  He is on doxycycline.  Patient is full weightbearing in a postoperative shoe.  Assessment & Plan: Visit Diagnoses:  1. History of transmetatarsal amputation of right foot ([place])     Plan: Patient will start Dial soap cleansing dry dressing change daily.  Discussed the importance of minimizing weightbearing.  I have contacted Dr. Loney Loh and have recommended holding on the hip surgery and until this wound has healed.  Follow-Up Instructions: Return in about 1 week (around 03/06/2022).   Ortho Exam  Patient is alert, oriented, no adenopathy, well-dressed, normal affect, normal respiratory effort. Examination patient has dehiscence of the transmetatarsal amputation.  There is no cellulitis no odor no drainage.  Wound is 3 x 7 cm.  Imaging: No results found. No images are attached to the encounter.  Labs: Lab Results  Component Value Date   HGBA1C 6.2 (H) 02/03/2020   HGBA1C 5.3 10/28/2015   HGBA1C 5.6 08/10/2014   ESRSEDRATE 14 06/21/2021   ESRSEDRATE 17 05/18/2021   ESRSEDRATE 20 (H) 03/28/2020   CRP 19.2 (H) 06/21/2021   CRP 11.1 (H) 05/18/2021   CRP 2.7 (H) 03/28/2020   REPTSTATUS 03/31/2020 FINAL 03/26/2020   GRAMSTAIN NO WBC SEEN NO ORGANISMS SEEN  03/26/2020   CULT  03/26/2020    No growth aerobically or anaerobically. Performed at [facility], [place] [address] [place], [place] [address]    LABORGA STAPHYLOCOCCUS AUREUS 03/26/2020      Lab Results  Component Value Date   ALBUMIN 3.0 (L) 03/26/2020   ALBUMIN 3.6 03/25/2020   ALBUMIN 4.1 03/24/2020    Lab Results  Component Value Date   MG 1.8 05/22/2021   MG 2.0 02/12/2020   MG 1.7 02/11/2020   Lab Results  Component Value Date   VD25OH 28.61 (L) 05/05/2020    No results found for: "PREALBUMIN"    Latest Ref Rng & Units 02/03/2022    2:24 PM 08/26/2021   10:15 AM 08/02/2021    9:35 AM  CBC EXTENDED  WBC 4.0 - 10.5 K/uL 10.6  9.0  6.6   RBC 4.22 - 5.81 MIL/uL 4.84  4.89  4.58   Hemoglobin 13.0 - 17.0 g/dL 11.7  12.1  11.2   HCT 39.0 - 52.0 % 39.6  38.6  35.9   Platelets 150 - 400 K/uL 311  337  343      There is no height or weight on file to calculate BMI.  Orders:  No orders of the defined types were placed in this encounter.  No orders of the defined types were placed in this encounter.    Procedures: No procedures performed  Clinical Data: No additional findings.  ROS:  All other systems negative, except as noted in the HPI. Review of Systems  Objective: Vital Signs: There were no  vitals taken for this visit.  Specialty Comments:  No specialty comments available.  PMFS History: Patient Active Problem List   Diagnosis Date Noted   History of transmetatarsal amputation of right foot ([place]) 02/15/2022   Chronic osteomyelitis of toe, right ([place]) 02/10/2022   Fracture of intertrochanteric section of femur, closed, right, with nonunion, subsequent encounter 12/24/2020   Inguinal hernia 10/18/2020   Chronic combined systolic and diastolic CHF, NYHA class 2 ([place])    Osteoporosis 05/05/2020   Foot infection    NICM (nonischemic cardiomyopathy) ([place]), no significant CAD on cardiac cath   02/12/2020   Right foot pain    Rheumatoid arthritis ([place])    Atrial flutter ([place]) 02/02/2020   Lower extremity edema 12/30/2019   Weight loss, non-intentional 11/24/2019   Chronic right hip pain 06/19/2019   Degenerative tear of acetabular  labrum of right hip 05/02/2019   Primary osteoarthritis of right hip 05/02/2019   Skin ulcer ([place]) 01/29/2019   Malignant neoplasm of prostate ([place]) 01/16/2018   Insomnia 10/02/2017   S/P bilateral TKA 09/13/2015   Other bilateral secondary osteoarthritis of knee 07/14/2014   Heme positive stool 05/21/2013   Osteoarthritis, multiple sites 01/13/2011   Hypertension 12/06/2010   CLAUSTROPHOBIA 03/01/2010   Past Medical History:  Diagnosis Date   Anemia    Atrial flutter ([place]) 01/2020   Cancer ([place])    prostate   COVID 2021   mild case   Dysrhythmia    Aflutter   Heart failure with reduced ejection fraction (HCC)    Hypertension    Hypoglycemia    occ   NICM (nonischemic cardiomyopathy) ([place]) 02/12/2020   Prostate cancer ([place]) 2019   Rheumatoid arthritis ([place])     Family History  Problem Relation Age of Onset   Prostate cancer Father    Prostate cancer Brother    Colon cancer Neg Hx    Rectal cancer Neg Hx    Stomach cancer Neg Hx     Past Surgical History:  Procedure Laterality Date   AMPUTATION Right 02/10/2022   Procedure: RIGHT TRANSMETATARSAL AMPUTATION;  Surgeon: Newt Minion, MD;  Location: [place] [facility];  Service: Orthopedics;  Laterality: Right;   AMPUTATION TOE Right 03/26/2020   Procedure: Right 3rd toe partial amputation, bone biopsy right 1st metatarsal;  Surgeon: Evelina Bucy, DPM;  [facility];  Service: Podiatry;  Laterality: Right;   BUBBLE STUDY  02/11/2020   Procedure: BUBBLE STUDY;  Surgeon: Geralynn Rile, MD;  Location: [place];  Service: Cardiovascular;;   CARDIOVERSION N/A 02/11/2020   Procedure: CARDIOVERSION;  Surgeon: Geralynn Rile, MD;  Location: [place];  Service: Cardiovascular;  Laterality: N/A;   CARDIOVERSION N/A 08/31/2021   Procedure: CARDIOVERSION;  Surgeon: Jerline Pain, MD;  Location: [facility] ENDOSCOPY;  Service: Cardiovascular;  Laterality: N/A;   CYSTOSCOPY  04/11/2018   Procedure: Erlene Quan;   Surgeon: [name], MD;  Location: [facility];  Service: Urology;;  NO SEEDS FOUND IN BLADDER   HERNIA REPAIR  2009   inguinal   INTRAMEDULLARY (IM) NAIL INTERTROCHANTERIC Right 05/05/2020   Procedure: CPT 27245-Cephalomedullary nailing of right intertrochanteric femur fracture;  Surgeon: Shona Needles, MD;  Location: [place];  Service: Orthopedics;  Laterality: Right;   INTRAMEDULLARY (IM) NAIL INTERTROCHANTERIC Right 12/24/2020   Procedure: REVISION FIXATION OF RIGHT INTERTROCHANTERIC FEMUR NONUNION;  Surgeon: Shona Needles, MD;  Location: [place];  Service: Orthopedics;  Laterality: Right;   IRRIGATION AND DEBRIDEMENT FOOT Left  03/26/2020   Procedure: Left foot incision and drainage with removal of all non-viable soft tissue and bone - areas overlying the 2nd and 5th metatarsals.;  Surgeon: Evelina Bucy, DPM;  [facility];  Service: Podiatry;  Laterality: Left;   RADIOACTIVE SEED IMPLANT N/A 04/11/2018   Procedure: RADIOACTIVE SEED IMPLANT/BRACHYTHERAPY IMPLANT;  Surgeon: [name], MD;  Location: [facility];  Service: Urology;  Laterality: N/A;   22     SEEDS IMPLANTED   RIGHT/LEFT HEART CATH AND CORONARY ANGIOGRAPHY N/A 02/09/2020   Procedure: RIGHT/LEFT HEART CATH AND CORONARY ANGIOGRAPHY;  Surgeon: Nelva Bush, MD;  Location: [place] [facility];  Service: Cardiovascular;  Laterality: N/A;   SPACE OAR INSTILLATION N/A 04/11/2018   Procedure: SPACE OAR INSTILLATION;  Surgeon: [name], MD;  Location: [facility];  Service: Urology;  Laterality: N/A;   TEE WITHOUT CARDIOVERSION N/A 02/11/2020   Procedure: TRANSESOPHAGEAL ECHOCARDIOGRAM (TEE);  Surgeon: Geralynn Rile, MD;  Location: [place];  Service: Cardiovascular;  Laterality: N/A;   TOTAL KNEE ARTHROPLASTY Bilateral 09/13/2015   Procedure: BILATERAL KNEE ARTHROPLASTY ;  Surgeon: Paralee Cancel, MD;  [facility];  Service: Orthopedics;   Laterality: Bilateral;   Social History   Occupational History   Not on file  Tobacco Use   Smoking status: Never   Smokeless tobacco: Never  Vaping Use   Vaping Use: Never used  Substance and Sexual Activity   Alcohol use: Not Currently    Comment: 2 beers a week   Drug use: Not Currently   Sexual activity: Yes

## 2022-03-01 ENCOUNTER — Encounter: Payer: Medicare Other | Admitting: Family

## 2022-03-06 ENCOUNTER — Encounter: Payer: Self-pay | Admitting: Orthopedic Surgery

## 2022-03-06 ENCOUNTER — Ambulatory Visit (INDEPENDENT_AMBULATORY_CARE_PROVIDER_SITE_OTHER): Payer: Medicare Other | Admitting: Orthopedic Surgery

## 2022-03-06 DIAGNOSIS — T8781 Dehiscence of amputation stump: Secondary | ICD-10-CM

## 2022-03-06 NOTE — Telephone Encounter (Signed)
SW pt he is informed that the surgery is booked as inpatient and he can leave after surgery if he is not having any pain.

## 2022-03-06 NOTE — Telephone Encounter (Signed)
Patient states he is having surgery this week and is upset that he may have to stay overnight. Please call [phone]

## 2022-03-06 NOTE — Progress Notes (Signed)
Office Visit Note   Patient: Thomas [name]           Date of Birth: March 04, 1951           MRN: [id number] Visit Date: 03/06/2022              Requested by: [name], MD [address] [place],  [place] [address] PCP: [name], MD  Chief Complaint  Patient presents with   Right Foot - Routine Post Op    02/10/2022 right transmet amputation with graft       HPI: Patient is a 71 year old gentleman who is status post a distal right transmetatarsal amputation with application of Kerecis tissue graft.  Patient presents full weightbearing.  He has been doing daily cleaning and dressing changes.  Assessment & Plan: Visit Diagnoses:  1. Dehiscence of amputation stump of right lower extremity ([place])     Plan: With the complete dehiscence of the surgical incision have recommended proceeding with a more proximal transmetatarsal amputation or show part amputation.  Discussed the importance of nonweightbearing postoperatively.  Follow-Up Instructions: Return in about 2 weeks (around 03/20/2022).   Ortho Exam  Patient is alert, oriented, no adenopathy, well-dressed, normal affect, normal respiratory effort.  Patient is full weightbearing. Examination patient has significant dehiscence of the distal transmetatarsal amputation.  He has an open wound that is 4 x 8 cm and 2 cm deep.  There is dermatitis.  Patient has a palpable dorsalis pedis and posterior tibial pulse.  There is swelling.  The medial and lateral aspects of the incision have healed well.  Imaging: No results found.   Labs: Lab Results  Component Value Date   HGBA1C 6.2 (H) 02/03/2020   HGBA1C 5.3 10/28/2015   HGBA1C 5.6 08/10/2014   ESRSEDRATE 14 06/21/2021   ESRSEDRATE 17 05/18/2021   ESRSEDRATE 20 (H) 03/28/2020   CRP 19.2 (H) 06/21/2021   CRP 11.1 (H) 05/18/2021   CRP 2.7 (H) 03/28/2020   REPTSTATUS 03/31/2020 FINAL 03/26/2020   GRAMSTAIN NO WBC SEEN NO ORGANISMS SEEN   03/26/2020   CULT  03/26/2020    No growth aerobically or anaerobically. Performed at [facility], [place] [address] [place], [place] [address]    LABORGA STAPHYLOCOCCUS AUREUS 03/26/2020     Lab Results  Component Value Date   ALBUMIN 3.0 (L) 03/26/2020   ALBUMIN 3.6 03/25/2020   ALBUMIN 4.1 03/24/2020    Lab Results  Component Value Date   MG 1.8 05/22/2021   MG 2.0 02/12/2020   MG 1.7 02/11/2020   Lab Results  Component Value Date   VD25OH 28.61 (L) 05/05/2020    No results found for: "PREALBUMIN"    Latest Ref Rng & Units 02/03/2022    2:24 PM 08/26/2021   10:15 AM 08/02/2021    9:35 AM  CBC EXTENDED  WBC 4.0 - 10.5 K/uL 10.6  9.0  6.6   RBC 4.22 - 5.81 MIL/uL 4.84  4.89  4.58   Hemoglobin 13.0 - 17.0 g/dL 11.7  12.1  11.2   HCT 39.0 - 52.0 % 39.6  38.6  35.9   Platelets 150 - 400 K/uL 311  337  343      There is no height or weight on file to calculate BMI.  Orders:  No orders of the defined types were placed in this encounter.  No orders of the defined types were placed in this encounter.    Procedures: No procedures performed  Clinical Data: No additional findings.  ROS:  All other systems negative, except as noted in the HPI. Review of Systems  Objective: Vital Signs: There were no vitals taken for this visit.  Specialty Comments:  No specialty comments available.  PMFS History: Patient Active Problem List   Diagnosis Date Noted   History of transmetatarsal amputation of right foot ([place]) 02/15/2022   Chronic osteomyelitis of toe, right ([place]) 02/10/2022   Fracture of intertrochanteric section of femur, closed, right, with nonunion, subsequent encounter 12/24/2020   Inguinal hernia 10/18/2020   Chronic combined systolic and diastolic CHF, NYHA class 2 ([place])    Osteoporosis 05/05/2020   Foot infection    NICM (nonischemic cardiomyopathy) ([place]), no significant CAD on cardiac cath   02/12/2020   Right foot pain    Rheumatoid  arthritis ([place])    Atrial flutter ([place]) 02/02/2020   Lower extremity edema 12/30/2019   Weight loss, non-intentional 11/24/2019   Chronic right hip pain 06/19/2019   Degenerative tear of acetabular labrum of right hip 05/02/2019   Primary osteoarthritis of right hip 05/02/2019   Skin ulcer ([place]) 01/29/2019   Malignant neoplasm of prostate ([place]) 01/16/2018   Insomnia 10/02/2017   S/P bilateral TKA 09/13/2015   Other bilateral secondary osteoarthritis of knee 07/14/2014   Heme positive stool 05/21/2013   Osteoarthritis, multiple sites 01/13/2011   Hypertension 12/06/2010   CLAUSTROPHOBIA 03/01/2010   Past Medical History:  Diagnosis Date   Anemia    Atrial flutter ([place]) 01/2020   Cancer ([place])    prostate   COVID 2021   mild case   Dysrhythmia    Aflutter   Heart failure with reduced ejection fraction (H[facility])    Hypertension    Hypoglycemia    occ   NICM (nonischemic cardiomyopathy) ([place]) 02/12/2020   Prostate cancer ([place]) 2019   Rheumatoid arthritis ([place])     Family History  Problem Relation Age of Onset   Prostate cancer Father    Prostate cancer Brother    Colon cancer Neg Hx    Rectal cancer Neg Hx    Stomach cancer Neg Hx     Past Surgical History:  Procedure Laterality Date   AMPUTATION Right 02/10/2022   Procedure: RIGHT TRANSMETATARSAL AMPUTATION;  Surgeon: Newt Minion, MD;  Location: [facility];  Service: Orthopedics;  Laterality: Right;   AMPUTATION TOE Right 03/26/2020   Procedure: Right 3rd toe partial amputation, bone biopsy right 1st metatarsal;  Surgeon: Evelina Bucy, DPM;  Location: [facility];  Service: Podiatry;  Laterality: Right;   BUBBLE STUDY  02/11/2020   Procedure: BUBBLE STUDY;  Surgeon: Geralynn Rile, MD;  Location: [facility];  Service: Cardiovascular;;   CARDIOVERSION N/A 02/11/2020   Procedure: CARDIOVERSION;  Surgeon: Geralynn Rile, MD;  Location: [facility];  Service: Cardiovascular;  Laterality: N/A;   CARDIOVERSION N/A  08/31/2021   Procedure: CARDIOVERSION;  Surgeon: Jerline Pain, MD;  Location: [facility];  Service: Cardiovascular;  Laterality: N/A;   CYSTOSCOPY  04/11/2018   Procedure: Erlene Quan;  Surgeon: [name], MD;  Location: [facility];  Service: Urology;;  NO SEEDS FOUND IN BLADDER   HERNIA REPAIR  2009   inguinal   INTRAMEDULLARY (IM) NAIL INTERTROCHANTERIC Right 05/05/2020   Procedure: CPT 27245-Cephalomedullary nailing of right intertrochanteric femur fracture;  Surgeon: Shona Needles, MD;  Location: [facility];  Service: Orthopedics;  Laterality: Right;   INTRAMEDULLARY (IM) NAIL INTERTROCHANTERIC Right 12/24/2020   Procedure: REVISION FIXATION  OF RIGHT INTERTROCHANTERIC FEMUR NONUNION;  Surgeon: Shona Needles, MD;  Location: [facility];  Service: Orthopedics;  Laterality: Right;   IRRIGATION AND DEBRIDEMENT FOOT Left 03/26/2020   Procedure: Left foot incision and drainage with removal of all non-viable soft tissue and bone - areas overlying the 2nd and 5th metatarsals.;  Surgeon: Evelina Bucy, DPM;  Location: [facility];  Service: Podiatry;  Laterality: Left;   RADIOACTIVE SEED IMPLANT N/A 04/11/2018   Procedure: RADIOACTIVE SEED IMPLANT/BRACHYTHERAPY IMPLANT;  Surgeon: [name], MD;  Location: [facility];  Service: Urology;  Laterality: N/A;   35     SEEDS IMPLANTED   RIGHT/LEFT HEART CATH AND CORONARY ANGIOGRAPHY N/A 02/09/2020   Procedure: RIGHT/LEFT HEART CATH AND CORONARY ANGIOGRAPHY;  Surgeon: Nelva Bush, MD;  Location: [facility];  Service: Cardiovascular;  Laterality: N/A;   SPACE OAR INSTILLATION N/A 04/11/2018   Procedure: SPACE OAR INSTILLATION;  Surgeon: [name], MD;  Location: [facility];  Service: Urology;  Laterality: N/A;   TEE WITHOUT CARDIOVERSION N/A 02/11/2020   Procedure: TRANSESOPHAGEAL ECHOCARDIOGRAM (TEE);  Surgeon: Geralynn Rile, MD;  Location: [facility];   Service: Cardiovascular;  Laterality: N/A;   TOTAL KNEE ARTHROPLASTY Bilateral 09/13/2015   Procedure: BILATERAL KNEE ARTHROPLASTY ;  Surgeon: Paralee Cancel, MD;  Location: [facility];  Service: Orthopedics;  Laterality: Bilateral;   Social History   Occupational History   Not on file  Tobacco Use   Smoking status: Never   Smokeless tobacco: Never  Vaping Use   Vaping Use: Never used  Substance and Sexual Activity   Alcohol use: Not Currently    Comment: 2 beers a week   Drug use: Not Currently   Sexual activity: Yes

## 2022-03-08 ENCOUNTER — Encounter (HOSPITAL_COMMUNITY): Payer: Self-pay | Admitting: Orthopedic Surgery

## 2022-03-08 ENCOUNTER — Other Ambulatory Visit: Payer: Self-pay

## 2022-03-08 NOTE — Progress Notes (Signed)
PCP - Dr Talbert Cage Cardiologist - Dr Gwyndolyn Kaufman  Chest x-ray - 05/22/21 (1V) EKG - 10/04/21 Stress Test - n/a ECHO - 11/10/21 Cardiac Cath - 02/09/20  ICD Pacemaker/Loop - n/a  Sleep Study -  n/a CPAP - none  Blood Thinner Instructions:  Last dose of Eliquis was on 03/07/22.  ERAS: Clear liquids til 9 am DOS.  Anesthesia review: Yes  STOP now taking any Aspirin (unless otherwise instructed by your surgeon), Aleve, Naproxen, Ibuprofen, Motrin, Advil, Goody's, BC's, all herbal medications, fish oil, and all vitamins.   Coronavirus Screening Do you have any of the following symptoms:  Cough yes/no: No Fever (>100.32F)  yes/no: No Runny nose yes/no: No Sore throat yes/no: No Difficulty breathing/shortness of breath  yes/no: No  Have you traveled in the last 14 days and where? yes/no: No  Patient verbalized understanding of instructions that were given via phone.

## 2022-03-09 NOTE — Progress Notes (Signed)
Anesthesia Chart Review: Same day workup  Follows with cardiology for history of NICM with reduced EF (now recovered, 55 to 60% by echo 10/2021) HTN, recurrent atrial flutter s/p multiple DCCV maintained on on amiodarone and Eliquis.  Previously had preop evaluation for telephone encounter by Stephan Minister, NP on 11/02/2021 prior to scheduled hip replacement (ultimately did not have the surgery due to need for treatment of osteomyelitis).  Per note, ""Chart reviewed as part of pre-operative protocol coverage.  Thomas [name] was seen by Ermalinda Barrios, PA for cardiac evaluation for upcoming surgery on 10/04/21. He was felt to be at acceptable risk for upcoming surgery with RCRI for MACE of 0.9%. His functional capacity in METs is 7.25 according to the Duke Activity Status Index (DASI). Per office protocol, patient can hold Eliquis for 3 days prior to procedure."  History of rheumatoid arthritis followed by rheumatologist Dr. [name].  He is maintained on methotrexate.  Preop labs reviewed, mild anemia with hemoglobin 11.7, otherwise unremarkable.  Will need DOS eval.   EKG 10/04/2021: Sinus rhythm.  Rate 64.  Echo 11/10/2021  1. Limited echo for LV function   2. Left ventricular ejection fraction, by estimation, is 55 to 60%. Left  ventricular ejection fraction by 3D volume is 56 %. The left ventricle has  normal function. The left ventricle has no regional wall motion  abnormalities. There is mild left  ventricular hypertrophy. Left ventricular diastolic parameters are  consistent with Grade I diastolic dysfunction (impaired relaxation).   3. The aortic valve is tricuspid. Aortic valve regurgitation is not  visualized.   4. Aortic dilatation noted. There is mild dilatation of the aortic root,  measuring 41 mm.    Cardiac Cath 02/09/2020 Conclusions: No angiographically significant coronary artery disease. Upper normal left heart, right heart, and pulmonary artery pressures. Mildly  reduced Fick cardiac output/index.   Recommendations: Restart heparin infusion 2 hours after TR band removal.  If there is no evidence of bleeding/vascular complication, consider transitioning back to apixaban as soon as tomorrow. Proceed with TEE/cardioversion as soon as tomorrow.  I will make Thomas [name] NPO after midnight in anticipation of this. Optimize evidence-based heart failure therapy for non-ischemic cardiomyopathy. Primary prevention of coronary artery dsease.   Wynonia Musty [facility] Short Stay [facility]/Anesthesiology Phone [phone] 03/09/2022 10:25 AM

## 2022-03-09 NOTE — Anesthesia Preprocedure Evaluation (Addendum)
Anesthesia Evaluation  Patient identified by MRN, date of birth, ID band Patient awake    Reviewed: Allergy & Precautions, NPO status , Patient's Chart, lab work & pertinent test results, reviewed documented beta blocker date and time   Airway Mallampati: II  TM Distance: >3 FB Neck ROM: Full    Dental  (+) Dental Advisory Given, Teeth Intact   Pulmonary    Pulmonary exam normal breath sounds clear to auscultation       Cardiovascular hypertension, Pt. on medications and Pt. on home beta blockers +CHF  Normal cardiovascular exam+ dysrhythmias  Rhythm:Regular Rate:Normal  Echo 10/2021  1. Limited echo for LV function   2. Left ventricular ejection fraction, by estimation, is 55 to 60%. Left ventricular ejection fraction by 3D volume is 56 %. The left ventricle has normal function. The left ventricle has no regional wall motion abnormalities. There is mild left ventricular hypertrophy. Left ventricular diastolic parameters are consistent with Grade I diastolic dysfunction (impaired relaxation).   3. The aortic valve is tricuspid. Aortic valve regurgitation is not visualized.   4. Aortic dilatation noted. There is mild dilatation of the aortic root, measuring 41 mm.   Comparison(s): Changes from prior study are noted. 08/18/2021: LVEF 30-35%.   Echo 07/2021  1. Left ventricular ejection fraction, by estimation, is 30 to 35%. The left ventricle has moderately decreased function. The left ventricle demonstrates global hypokinesis. Left ventricular diastolic parameters are indeterminate.   2. Right ventricular systolic function is mildly reduced. The right ventricular size is normal. Tricuspid regurgitation signal is inadequate for assessing PA pressure.   3. Left atrial size was moderately dilated.   4. Right atrial size was mildly dilated.   5. Trivial mitral valve regurgitation. No evidence of mitral stenosis.   6. The aortic valve was not  well visualized. Aortic valve regurgitation is not visualized. No aortic stenosis is present.   7. The inferior vena cava is normal in size with greater than 50% respiratory variability, suggesting right atrial pressure of 3 mmHg.   8. The patient was in atrial flutter.    Cath 01/2020 Conclusions: 1. No angiographically significant coronary artery disease. 2. Upper normal left heart, right heart, and pulmonary artery pressures. 3. Mildly reduced Fick cardiac output/index.   Recommendations: 1. Restart heparin infusion 2 hours after TR band removal.  If there is no evidence of bleeding/vascular complication, consider transitioning back to apixaban as soon as tomorrow. 2. Proceed with TEE/cardioversion as soon as tomorrow.  I will make Mr. Pecore NPO after midnight in anticipation of this. 3. Optimize evidence-based heart failure therapy for non-ischemic cardiomyopathy. 4. Primary prevention of coronary artery disease.     Neuro/Psych  PSYCHIATRIC DISORDERS Anxiety        GI/Hepatic negative GI ROS, Neg liver ROS,,,  Endo/Other  negative endocrine ROS    Renal/GU negative Renal ROS     Musculoskeletal  (+) Arthritis ,    Abdominal   Peds  Hematology  (+) Blood dyscrasia, anemia   Anesthesia Other Findings   Reproductive/Obstetrics                             Anesthesia Physical Anesthesia Plan  ASA: 3  Anesthesia Plan: MAC   Post-op Pain Management: Regional block* and Tylenol PO (pre-op)*   Induction:   PONV Risk Score and Plan: 2 and Ondansetron, Propofol infusion, Treatment may vary due to age or medical condition, TIVA and Midazolam  Airway Management Planned: Natural Airway  Additional Equipment:   Intra-op Plan:   Post-operative Plan:   Informed Consent: I have reviewed the patients History and Physical, chart, labs and discussed the procedure including the risks, benefits and alternatives for the proposed anesthesia with  the patient or authorized representative who has indicated his/her understanding and acceptance.     Dental advisory given  Plan Discussed with: CRNA  Anesthesia Plan Comments: (PAT note by Karoline Caldwell, PA-C:  Follows with cardiology for history of NICM with reduced EF (now recovered, 79 to 60% by echo 10/2021) HTN, recurrent atrial flutter s/p multiple DCCV maintained on on amiodarone and Eliquis.  Previously had preop evaluation for telephone encounter by Stephan Minister, NP on 11/02/2021 prior to scheduled hip replacement (ultimately did not have the surgery due to need for treatment of osteomyelitis).  Per note, ""Chart reviewed as part of pre-operative protocol coverage.Lelon Mast Coltranewas seen by Ermalinda Barrios, PA for cardiac evaluation for upcoming surgery on 10/04/21.He was felt to be at acceptable risk for upcoming surgery with RCRI for MACE of 0.9%. His functional capacity in METs is 7.25 according to the Duke Activity Status Index (DASI).Per office protocol, patient can holdEliquisfor 3days prior to procedure."  History of rheumatoid arthritis followed by rheumatologist Dr. [name].  He is maintained on methotrexate.  Preop labs reviewed, mild anemia with hemoglobin 11.7, otherwise unremarkable.  Will need DOS eval.   EKG 10/04/2021: Sinus rhythm.  Rate 64.  Echo 11/10/2021 1. Limited echo for LV function  2. Left ventricular ejection fraction, by estimation, is 55 to 60%. Left  ventricular ejection fraction by 3D volume is 56 %. The left ventricle has  normal function. The left ventricle has no regional wall motion  abnormalities. There is mild left  ventricular hypertrophy. Left ventricular diastolic parameters are  consistent with Grade I diastolic dysfunction (impaired relaxation).  3. The aortic valve is tricuspid. Aortic valve regurgitation is not  visualized.  4. Aortic dilatation noted. There is mild dilatation of the aortic root,  measuring 41 mm.    Cardiac Cath 02/09/2020 Conclusions: 1. No angiographically significant coronary artery disease. 2. Upper normal left heart, right heart, and pulmonary artery pressures. 3. Mildly reduced Fick cardiac output/index.  Recommendations: 1. Restart heparin infusion 2 hours after TR band removal. If there is no evidence of bleeding/vascular complication, consider transitioning back to apixaban as soon as tomorrow. 2. Proceed with TEE/cardioversion as soon as tomorrow. I will make Mr. Thomas [name] NPO after midnight in anticipation of this. 3. Optimize evidence-based heart failure therapy for non-ischemic cardiomyopathy. 4. Primary prevention of coronary artery dsease.  )       Anesthesia Quick Evaluation

## 2022-03-10 ENCOUNTER — Ambulatory Visit (HOSPITAL_BASED_OUTPATIENT_CLINIC_OR_DEPARTMENT_OTHER): Payer: Medicare Other | Admitting: Physician Assistant

## 2022-03-10 ENCOUNTER — Encounter (HOSPITAL_COMMUNITY): Payer: Self-pay | Admitting: Orthopedic Surgery

## 2022-03-10 ENCOUNTER — Other Ambulatory Visit: Payer: Self-pay

## 2022-03-10 ENCOUNTER — Ambulatory Visit (HOSPITAL_COMMUNITY)
Admission: RE | Admit: 2022-03-10 | Discharge: 2022-03-10 | Disposition: A | Discharge status: Home / Self Care | Payer: Medicare Other | Attending: Orthopedic Surgery | Admitting: Orthopedic Surgery

## 2022-03-10 ENCOUNTER — Encounter (HOSPITAL_COMMUNITY)
Admission: RE | Disposition: A | Discharge status: Home / Self Care | Payer: Self-pay | Source: Home / Self Care | Attending: Orthopedic Surgery

## 2022-03-10 ENCOUNTER — Ambulatory Visit (HOSPITAL_COMMUNITY): Payer: Medicare Other | Admitting: Physician Assistant

## 2022-03-10 DIAGNOSIS — I509 Heart failure, unspecified: Secondary | ICD-10-CM | POA: Diagnosis not present

## 2022-03-10 DIAGNOSIS — I110 Hypertensive heart disease with heart failure: Secondary | ICD-10-CM

## 2022-03-10 DIAGNOSIS — Z89431 Acquired absence of right foot: Secondary | ICD-10-CM

## 2022-03-10 DIAGNOSIS — T8781 Dehiscence of amputation stump: Secondary | ICD-10-CM | POA: Diagnosis not present

## 2022-03-10 DIAGNOSIS — D649 Anemia, unspecified: Secondary | ICD-10-CM | POA: Diagnosis not present

## 2022-03-10 HISTORY — PX: AMPUTATION: SHX166

## 2022-03-10 LAB — CBC
HCT: 37.4 % — ABNORMAL LOW (ref 39.0–52.0)
Hemoglobin: 11.2 g/dL — ABNORMAL LOW (ref 13.0–17.0)
MCH: 23.3 pg — ABNORMAL LOW (ref 26.0–34.0)
MCHC: 29.9 g/dL — ABNORMAL LOW (ref 30.0–36.0)
MCV: 77.9 fL — ABNORMAL LOW (ref 80.0–100.0)
Platelets: 370 10*3/uL (ref 150–400)
RBC: 4.8 MIL/uL (ref 4.22–5.81)
RDW: 17 % — ABNORMAL HIGH (ref 11.5–15.5)
WBC: 5.1 10*3/uL (ref 4.0–10.5)
nRBC: 0 % (ref 0.0–0.2)

## 2022-03-10 LAB — BASIC METABOLIC PANEL
Anion gap: 10 (ref 5–15)
BUN: 10 mg/dL (ref 8–23)
CO2: 23 mmol/L (ref 22–32)
Calcium: 8.7 mg/dL — ABNORMAL LOW (ref 8.9–10.3)
Chloride: 109 mmol/L (ref 98–111)
Creatinine, Ser: 0.75 mg/dL (ref 0.61–1.24)
GFR, Estimated: >60 mL/min (ref >60)
Glucose, Bld: 119 mg/dL — ABNORMAL HIGH (ref 70–99)
Potassium: 3.8 mmol/L (ref 3.5–5.1)
Sodium: 142 mmol/L (ref 135–145)

## 2022-03-10 LAB — SURGICAL PCR SCREEN
MRSA, PCR: NEGATIVE
Staphylococcus aureus: NEGATIVE

## 2022-03-10 SURGERY — AMPUTATION, FOOT, RAY
Anesthesia: Monitor Anesthesia Care | Site: Foot | Laterality: Right

## 2022-03-10 MED ORDER — PROPOFOL 500 MG/50ML IV EMUL
INTRAVENOUS | Status: DC | PRN
  Administered 2022-03-10: 125 ug/kg/min via INTRAVENOUS

## 2022-03-10 MED ORDER — CHLORHEXIDINE GLUCONATE 0.12 % MT SOLN
15.0000 mL | Freq: Once | OROMUCOSAL | Status: AC
  Administered 2022-03-10: 15 mL via OROMUCOSAL
  Filled 2022-03-10: qty 15

## 2022-03-10 MED ORDER — FENTANYL CITRATE (PF) 100 MCG/2ML IJ SOLN
INTRAMUSCULAR | Status: AC
  Administered 2022-03-10: 100 ug via INTRAVENOUS
  Filled 2022-03-10: qty 2

## 2022-03-10 MED ORDER — PROPOFOL 10 MG/ML IV BOLUS
INTRAVENOUS | Status: DC | PRN
  Administered 2022-03-10: 30 mg via INTRAVENOUS

## 2022-03-10 MED ORDER — FENTANYL CITRATE (PF) 250 MCG/5ML IJ SOLN
INTRAMUSCULAR | Status: AC
  Filled 2022-03-10: qty 5

## 2022-03-10 MED ORDER — FENTANYL CITRATE (PF) 100 MCG/2ML IJ SOLN
INTRAMUSCULAR | Status: AC
  Filled 2022-03-10: qty 2

## 2022-03-10 MED ORDER — ACETAMINOPHEN 500 MG PO TABS
1000.0000 mg | ORAL_TABLET | Freq: Once | ORAL | Status: DC
  Filled 2022-03-10: qty 2

## 2022-03-10 MED ORDER — CEFAZOLIN SODIUM-DEXTROSE 2-4 GM/100ML-% IV SOLN
2.0000 g | INTRAVENOUS | Status: AC
  Administered 2022-03-10: 2 g via INTRAVENOUS
  Filled 2022-03-10: qty 100

## 2022-03-10 MED ORDER — OXYCODONE HCL 5 MG PO TABS
5.0000 mg | ORAL_TABLET | Freq: Once | ORAL | Status: AC
  Administered 2022-03-10: 5 mg via ORAL

## 2022-03-10 MED ORDER — DEXAMETHASONE SODIUM PHOSPHATE 4 MG/ML IJ SOLN
INTRAMUSCULAR | Status: DC | PRN
  Administered 2022-03-10: 5 mg via PERINEURAL

## 2022-03-10 MED ORDER — ORAL CARE MOUTH RINSE: 15.0000 mL | Freq: Once | OROMUCOSAL | Status: AC

## 2022-03-10 MED ORDER — ONDANSETRON HCL 4 MG/2ML IJ SOLN
INTRAMUSCULAR | Status: AC
  Filled 2022-03-10: qty 2

## 2022-03-10 MED ORDER — OXYCODONE HCL 5 MG PO TABS
ORAL_TABLET | ORAL | Status: AC
  Filled 2022-03-10: qty 1

## 2022-03-10 MED ORDER — ROPIVACAINE HCL 7.5 MG/ML IJ SOLN
INTRAMUSCULAR | Status: DC | PRN
  Administered 2022-03-10: 30 mL via PERINEURAL

## 2022-03-10 MED ORDER — 0.9 % SODIUM CHLORIDE (POUR BTL) OPTIME
TOPICAL | Status: DC | PRN
  Administered 2022-03-10: 1000 mL

## 2022-03-10 MED ORDER — MIDAZOLAM HCL 2 MG/2ML IJ SOLN: 1.0000 mg | Freq: Once | INTRAMUSCULAR | Status: AC

## 2022-03-10 MED ORDER — LACTATED RINGERS IV SOLN: INTRAVENOUS | Status: DC

## 2022-03-10 MED ORDER — METOPROLOL SUCCINATE ER 25 MG PO TB24
25.0000 mg | ORAL_TABLET | Freq: Once | ORAL | Status: AC
  Administered 2022-03-10: 25 mg via ORAL

## 2022-03-10 MED ORDER — FENTANYL CITRATE (PF) 100 MCG/2ML IJ SOLN
25.0000 ug | INTRAMUSCULAR | Status: DC | PRN
  Administered 2022-03-10 (×2): 50 ug via INTRAVENOUS

## 2022-03-10 MED ORDER — MEPERIDINE HCL 25 MG/ML IJ SOLN: 6.2500 mg | INTRAMUSCULAR | Status: DC | PRN

## 2022-03-10 MED ORDER — PROMETHAZINE HCL 25 MG/ML IJ SOLN: 6.2500 mg | INTRAMUSCULAR | Status: DC | PRN

## 2022-03-10 MED ORDER — FENTANYL CITRATE (PF) 100 MCG/2ML IJ SOLN: 100.0000 ug | Freq: Once | INTRAMUSCULAR | Status: AC

## 2022-03-10 MED ORDER — METOPROLOL SUCCINATE ER 25 MG PO TB24
ORAL_TABLET | ORAL | Status: AC
  Filled 2022-03-10: qty 1

## 2022-03-10 MED ORDER — CLONIDINE HCL (ANALGESIA) 100 MCG/ML EP SOLN
EPIDURAL | Status: DC | PRN
  Administered 2022-03-10: 80 ug

## 2022-03-10 MED ORDER — ONDANSETRON HCL 4 MG/2ML IJ SOLN
INTRAMUSCULAR | Status: DC | PRN
  Administered 2022-03-10: 4 mg via INTRAVENOUS

## 2022-03-10 MED ORDER — MIDAZOLAM HCL 2 MG/2ML IJ SOLN
INTRAMUSCULAR | Status: AC
  Administered 2022-03-10: 1 mg via INTRAVENOUS
  Filled 2022-03-10: qty 2

## 2022-03-10 SURGICAL SUPPLY — 34 items
BAG COUNTER SPONGE SURGICOUNT (BAG) ×1 IMPLANT
BLADE SAW SGTL MED 73X18.5 STR (BLADE) IMPLANT
BLADE SURG 21 STRL SS (BLADE) ×1 IMPLANT
BNDG COHESIVE 4X5 TAN STRL (GAUZE/BANDAGES/DRESSINGS) ×1 IMPLANT
BNDG GAUZE DERMACEA FLUFF 4 (GAUZE/BANDAGES/DRESSINGS) ×1 IMPLANT
CANISTER PREVENA PLUS 150 (CANNISTER) IMPLANT
COVER SURGICAL LIGHT HANDLE (MISCELLANEOUS) ×2 IMPLANT
DRAPE DERMATAC (DRAPES) IMPLANT
DRAPE INCISE IOBAN 66X45 STRL (DRAPES) IMPLANT
DRAPE U-SHAPE 47X51 STRL (DRAPES) ×2 IMPLANT
DRESSING PEEL AND PLC PRVNA 13 (GAUZE/BANDAGES/DRESSINGS) IMPLANT
DRSG ADAPTIC 3X8 NADH LF (GAUZE/BANDAGES/DRESSINGS) ×1 IMPLANT
DRSG PEEL AND PLACE PREVENA 13 (GAUZE/BANDAGES/DRESSINGS) ×1
DURAPREP 26ML APPLICATOR (WOUND CARE) ×1 IMPLANT
ELECT REM PT RETURN 9FT ADLT (ELECTROSURGICAL) ×1
ELECTRODE REM PT RTRN 9FT ADLT (ELECTROSURGICAL) ×1 IMPLANT
GAUZE PAD ABD 8X10 STRL (GAUZE/BANDAGES/DRESSINGS) ×2 IMPLANT
GAUZE SPONGE 4X4 12PLY STRL (GAUZE/BANDAGES/DRESSINGS) ×1 IMPLANT
GLOVE BIOGEL PI IND STRL 9 (GLOVE) ×1 IMPLANT
GLOVE SURG ORTHO 9.0 STRL STRW (GLOVE) ×1 IMPLANT
GOWN STRL REUS W/ TWL XL LVL3 (GOWN DISPOSABLE) ×2 IMPLANT
GOWN STRL REUS W/TWL XL LVL3 (GOWN DISPOSABLE) ×2
GRAFT SKIN WND MICRO 38 (Tissue) IMPLANT
KIT BASIN OR (CUSTOM PROCEDURE TRAY) ×1 IMPLANT
KIT TURNOVER KIT B (KITS) ×1 IMPLANT
NS IRRIG 1000ML POUR BTL (IV SOLUTION) ×1 IMPLANT
PACK ORTHO EXTREMITY (CUSTOM PROCEDURE TRAY) ×1 IMPLANT
PAD ARMBOARD 7.5X6 YLW CONV (MISCELLANEOUS) ×2 IMPLANT
SPONGE T-LAP 18X18 ~~LOC~~+RFID (SPONGE) IMPLANT
STOCKINETTE IMPERVIOUS LG (DRAPES) IMPLANT
SUT ETHILON 2 0 PSLX (SUTURE) ×1 IMPLANT
TOWEL GREEN STERILE (TOWEL DISPOSABLE) ×1 IMPLANT
TUBE CONNECTING 12X1/4 (SUCTIONS) ×1 IMPLANT
YANKAUER SUCT BULB TIP NO VENT (SUCTIONS) ×1 IMPLANT

## 2022-03-10 NOTE — Progress Notes (Signed)
Orthopedic Tech Progress Note Patient Details:  Thomas [name] [birth year] [id number]  Patient ID: Thomas [name], male   DOB: 01-17-1951, 71 y.o.   MRN: [id number] Patient refused crutches stating they have a walker at home they are more comfortable using. Stressed the importance of the non weight bearing order from MD. Vernona Rieger 03/10/2022, 1:15 PM

## 2022-03-10 NOTE — Progress Notes (Signed)
Orthopedic Tech Progress Note Patient Details:  Thomas [name] Jan 08, 1951 [id number]  Ortho Devices Type of Ortho Device: Crutches Ortho Device/Splint Interventions: Ordered, Adjustment   Post Interventions Instructions Provided: Care of device  Thomas [name] 03/10/2022, 12:36 PM

## 2022-03-10 NOTE — Anesthesia Procedure Notes (Signed)
Anesthesia Regional Block: Popliteal block   Pre-Anesthetic Checklist: , timeout performed,  Correct Patient, Correct Site, Correct Laterality,  Correct Procedure, Correct Position, site marked,  Risks and benefits discussed,  Surgical consent,  Pre-op evaluation,  At surgeon's request and post-op pain management  Laterality: Lower and Right  Prep: chloraprep       Needles:  Injection technique: Single-shot  Needle Type: Stimiplex     Needle Length: 10cm  Needle Gauge: 21     Additional Needles:   Procedures:,,,, ultrasound used (permanent image in chart),,   Motor weakness within 5 minutes.  Narrative:  Start time: 03/10/2022 10:15 AM End time: 03/10/2022 10:35 AM Injection made incrementally with aspirations every 5 mL.  Performed by: Personally  Anesthesiologist: Nolon Nations, MD  Additional Notes: Nerve located and needle positioned with direct ultrasound guidance. Good perineural spread. Patient tolerated well.

## 2022-03-10 NOTE — Anesthesia Postprocedure Evaluation (Signed)
Anesthesia Post Note  Patient: Thomas [name]  Procedure(s) Performed: RIGHT FOOT CHOPART AMPUTATION (Right: Foot)     Patient location during evaluation: PACU Anesthesia Type: MAC Level of consciousness: awake and alert Pain management: pain level controlled Vital Signs Assessment: post-procedure vital signs reviewed and stable Respiratory status: spontaneous breathing Cardiovascular status: stable Anesthetic complications: no   No notable events documented.  Last Vitals:  Vitals:   03/10/22 1215 03/10/22 1230  BP: 129/83 (!) 150/83  Pulse: (!) 54 60  Resp: 15 20  Temp:  36.7 C  SpO2: 96% 98%    Last Pain:  Vitals:   03/10/22 1230  TempSrc:   PainSc: [place]

## 2022-03-10 NOTE — Transfer of Care (Signed)
Immediate Anesthesia Transfer of Care Note  Patient: Thomas [name]  Procedure(s) Performed: RIGHT FOOT CHOPART AMPUTATION (Right: Foot)  Patient Location: PACU  Anesthesia Type:MAC combined with regional for post-op pain  Level of Consciousness: awake, alert , and oriented  Airway & Oxygen Therapy: Patient Spontanous Breathing and Patient connected to face mask oxygen  Post-op Assessment: Report given to RN, Post -op Vital signs reviewed and stable, and Patient moving all extremities X 4  Post vital signs: Reviewed and stable  Last Vitals:  Vitals Value Taken Time  BP 115/74 03/10/22 1153  Temp    Pulse 53 03/10/22 1156  Resp 43 03/10/22 1155  SpO2 100 % 03/10/22 1156  Vitals shown include unvalidated device data.  Last Pain:  Vitals:   03/10/22 1040  TempSrc:   PainSc: 4       Patients Stated Pain Goal: 3 (11/88/67 7373)  Complications: No notable events documented.

## 2022-03-10 NOTE — Op Note (Signed)
03/10/2022  12:03 PM  PATIENT:  Thomas [name]    PRE-OPERATIVE DIAGNOSIS:  Dehiscence Right Transmetatarsal Amputation  POST-OPERATIVE DIAGNOSIS:  Same  PROCEDURE:  RIGHT FOOT CHOPART AMPUTATION Application of Kerecis micro powder 38 cm. Application of Prevena 13 cm wound VAC.  SURGEON:  Newt Minion, MD  PHYSICIAN ASSISTANT:None ANESTHESIA:   General  PREOPERATIVE INDICATIONS:  Thomas [name] is a  71 y.o. male with a diagnosis of Dehiscence Right Transmetatarsal Amputation who failed conservative measures and elected for surgical management.    The risks benefits and alternatives were discussed with the patient preoperatively including but not limited to the risks of infection, bleeding, nerve injury, cardiopulmonary complications, the need for revision surgery, among others, and the patient was willing to proceed.  OPERATIVE IMPLANTS: Kerecis micro powder 38 cm.  '@ENCIMAGES'$ @  OPERATIVE FINDINGS: Tissue margins were clear arteries were calcified.  Tissue was healthy and viable.  OPERATIVE PROCEDURE: Patient was brought the operating room after undergoing a regional anesthetic the right lower extremity was then prepped using DuraPrep draped into a sterile field a timeout was called.  A fishmouth incision was made just proximal to the dehiscence of the wound.  This was carried sharply down to bone and it amputation was performed through the base of the metatarsals essentially Chopart amputation.  Electrocautery was used hemostasis the wound was irrigated with normal saline.  The wound bed was filled with 38 cm of Kerecis micro powder for total surface area of 50 cm.  2-0 nylon was used for wound closure covered with a Praveena plus wound VAC and Coban this had a good suction fit patient was taken the PACU in stable condition.   DISCHARGE PLANNING:  Antibiotic duration: Preoperative antibiotics  Weightbearing: Nonweightbearing on the right  Pain medication: Patient has  pain medication  Dressing care/ Wound VAC: Wound VAC for 1 week  Ambulatory devices: Crutches  Discharge to: Home.  Follow-up: In the office 1 week post operative.

## 2022-03-10 NOTE — H&P (Signed)
Thomas [name] is an 71 y.o. male.   Chief Complaint: Dehiscence wound right foot transmetatarsal amputation. HPI: Patient is a 71 year old gentleman who is status post a distal right transmetatarsal amputation with application of Kerecis tissue graft.  Patient presents full weightbearing.  He has been doing daily cleaning and dressing changes.   Past Medical History:  Diagnosis Date   Anemia    Atrial flutter ([place]) 01/2020   Cancer ([place])    prostate   COVID 2021   mild case   Dysrhythmia    Aflutter   Heart failure with reduced ejection fraction (HCC)    Hypertension    Hypoglycemia    occ   NICM (nonischemic cardiomyopathy) ([place]) 02/12/2020   Prostate cancer ([place]) 2019   Rheumatoid arthritis ([place])     Past Surgical History:  Procedure Laterality Date   AMPUTATION Right 02/10/2022   Procedure: RIGHT TRANSMETATARSAL AMPUTATION;  Surgeon: Newt Minion, MD;  Location: [place];  Service: Orthopedics;  Laterality: Right;   AMPUTATION TOE Right 03/26/2020   Procedure: Right 3rd toe partial amputation, bone biopsy right 1st metatarsal;  Surgeon: Evelina Bucy, DPM;  [facility];  Service: Podiatry;  Laterality: Right;   BUBBLE STUDY  02/11/2020   Procedure: BUBBLE STUDY;  Surgeon: Geralynn Rile, MD;  Location: [place];  Service: Cardiovascular;;   CARDIOVERSION N/A 02/11/2020   Procedure: CARDIOVERSION;  Surgeon: Geralynn Rile, MD;  Location: [place];  Service: Cardiovascular;  Laterality: N/A;   CARDIOVERSION N/A 08/31/2021   Procedure: CARDIOVERSION;  Surgeon: Jerline Pain, MD;  Location: [facility] ENDOSCOPY;  Service: Cardiovascular;  Laterality: N/A;   CYSTOSCOPY  04/11/2018   Procedure: Erlene Quan;  Surgeon: [name], MD;  Location: [facility];  Service: Urology;;  NO SEEDS FOUND IN BLADDER   HERNIA REPAIR  2009   inguinal   INTRAMEDULLARY (IM) NAIL INTERTROCHANTERIC Right 05/05/2020   Procedure: CPT  27245-Cephalomedullary nailing of right intertrochanteric femur fracture;  Surgeon: Shona Needles, MD;  Location: [place];  Service: Orthopedics;  Laterality: Right;   INTRAMEDULLARY (IM) NAIL INTERTROCHANTERIC Right 12/24/2020   Procedure: REVISION FIXATION OF RIGHT INTERTROCHANTERIC FEMUR NONUNION;  Surgeon: Shona Needles, MD;  Location: [place];  Service: Orthopedics;  Laterality: Right;   IRRIGATION AND DEBRIDEMENT FOOT Left 03/26/2020   Procedure: Left foot incision and drainage with removal of all non-viable soft tissue and bone - areas overlying the 2nd and 5th metatarsals.;  Surgeon: Evelina Bucy, DPM;  [facility];  Service: Podiatry;  Laterality: Left;   RADIOACTIVE SEED IMPLANT N/A 04/11/2018   Procedure: RADIOACTIVE SEED IMPLANT/BRACHYTHERAPY IMPLANT;  Surgeon: [name], MD;  Location: [facility];  Service: Urology;  Laterality: N/A;   63     SEEDS IMPLANTED   RIGHT/LEFT HEART CATH AND CORONARY ANGIOGRAPHY N/A 02/09/2020   Procedure: RIGHT/LEFT HEART CATH AND CORONARY ANGIOGRAPHY;  Surgeon: Nelva Bush, MD;  Location: [place] [facility];  Service: Cardiovascular;  Laterality: N/A;   SPACE OAR INSTILLATION N/A 04/11/2018   Procedure: SPACE OAR INSTILLATION;  Surgeon: [name], MD;  Location: [facility];  Service: Urology;  Laterality: N/A;   TEE WITHOUT CARDIOVERSION N/A 02/11/2020   Procedure: TRANSESOPHAGEAL ECHOCARDIOGRAM (TEE);  Surgeon: Geralynn Rile, MD;  Location: [place];  Service: Cardiovascular;  Laterality: N/A;   TOTAL KNEE ARTHROPLASTY Bilateral 09/13/2015   Procedure: BILATERAL KNEE ARTHROPLASTY ;  Surgeon: Paralee Cancel, MD;  [facility];  Service: Orthopedics;  Laterality: Bilateral;    Family History  Problem Relation Age of Onset   Prostate cancer Father    Prostate cancer Brother    Colon cancer Neg Hx    Rectal cancer Neg Hx    Stomach cancer Neg Hx    Social History:  reports  that he has never smoked. He has never used smokeless tobacco. He reports current alcohol use. He reports that he does not currently use drugs.  Allergies:  Allergies  Allergen Reactions   Cefadroxil Hives and Palpitations    [facility] ED from [facility].   Diltiazem Hcl Swelling   Chlorthalidone Other (See Comments)    "Makes me light-headed and I don't like the way it makes me feel"   Voltaren [Diclofenac] Rash    No medications prior to admission.    No results found for this or any previous visit (from the past 48 hour(s)). No results found.  Review of Systems  All other systems reviewed and are negative.   Height '6\' 8"'$  (2.032 m), weight 102.9 kg. Physical Exam  Patient is alert, oriented, no adenopathy, well-dressed, normal affect, normal respiratory effort.  Patient is full weightbearing. Examination patient has significant dehiscence of the distal transmetatarsal amputation.  He has an open wound that is 4 x 8 cm and 2 cm deep.  There is dermatitis.  Patient has a palpable dorsalis pedis and posterior tibial pulse.  There is swelling.  The medial and lateral aspects of the incision have healed well. Assessment/Plan 1. Dehiscence of amputation stump of right lower extremity ([place])       Plan: With the complete dehiscence of the surgical incision have recommended proceeding with a more proximal transmetatarsal amputation or show part amputation.  Discussed the importance of nonweightbearing postoperatively.  Newt Minion, MD 03/10/2022, 6:57 AM

## 2022-03-10 NOTE — Anesthesia Procedure Notes (Signed)
Procedure Name: MAC Date/Time: 03/10/2022 11:05 AM  Performed by: Heide Scales, CRNAPre-anesthesia Checklist: Patient identified, Emergency Drugs available, Suction available and Patient being monitored Patient Re-evaluated:Patient Re-evaluated prior to induction Oxygen Delivery Method: Simple face mask Preoxygenation: Pre-oxygenation with 100% oxygen Induction Type: IV induction

## 2022-03-13 ENCOUNTER — Encounter (HOSPITAL_COMMUNITY): Payer: Self-pay | Admitting: Orthopedic Surgery

## 2022-03-13 ENCOUNTER — Ambulatory Visit (INDEPENDENT_AMBULATORY_CARE_PROVIDER_SITE_OTHER): Payer: Medicare Other | Admitting: Orthopedic Surgery

## 2022-03-13 DIAGNOSIS — Z89431 Acquired absence of right foot: Secondary | ICD-10-CM

## 2022-03-13 DIAGNOSIS — T8781 Dehiscence of amputation stump: Secondary | ICD-10-CM

## 2022-03-15 ENCOUNTER — Ambulatory Visit (INDEPENDENT_AMBULATORY_CARE_PROVIDER_SITE_OTHER): Payer: Medicare Other | Admitting: Family

## 2022-03-15 ENCOUNTER — Encounter: Payer: Self-pay | Admitting: Family

## 2022-03-15 DIAGNOSIS — Z89431 Acquired absence of right foot: Secondary | ICD-10-CM

## 2022-03-15 NOTE — Progress Notes (Signed)
Post-Op Visit Note   Patient: Thomas [name]           Date of Birth: 1951/04/09           MRN: [id number] Visit Date: 03/15/2022 PCP: [name], MD  Chief Complaint:  Chief Complaint  Patient presents with   Right Foot - Routine Post Op    03/10/2022 right Chopart  amputation     HPI:  HPI The patient is a 71 year old gentleman who is seen status post right foot Chopart amputation on November 17.  Wound VAC in place today.  This has a good seal to fit. Ortho Exam Wound VAC removed today.  His incision is well-approximated sutures there is scant maceration no gaping no drainage no sign of infection  Visit Diagnoses: No diagnosis found.  Plan: Begin daily Dial soap cleansing.  Dry dressings.  Nonweightbearing on the right foot.  He will follow-up in 2 weeks.  Consider suture removal.  Follow-Up Instructions: No follow-ups on file.   Imaging: No results found.  Orders:  No orders of the defined types were placed in this encounter.  No orders of the defined types were placed in this encounter.    PMFS History: Patient Active Problem List   Diagnosis Date Noted   Dehiscence of amputation stump ([place]) 03/10/2022   History of transmetatarsal amputation of right foot ([place]) 02/15/2022   Chronic osteomyelitis of toe, right ([place]) 02/10/2022   Fracture of intertrochanteric section of femur, closed, right, with nonunion, subsequent encounter 12/24/2020   Inguinal hernia 10/18/2020   Chronic combined systolic and diastolic CHF, NYHA class 2 ([place])    Osteoporosis 05/05/2020   Foot infection    NICM (nonischemic cardiomyopathy) ([place]), no significant CAD on cardiac cath   02/12/2020   Right foot pain    Rheumatoid arthritis ([place])    Atrial flutter ([place]) 02/02/2020   Lower extremity edema 12/30/2019   Weight loss, non-intentional 11/24/2019   Chronic right hip pain 06/19/2019   Degenerative tear of acetabular labrum of right hip 05/02/2019   Primary osteoarthritis  of right hip 05/02/2019   Skin ulcer ([place]) 01/29/2019   Malignant neoplasm of prostate ([place]) 01/16/2018   Insomnia 10/02/2017   S/P bilateral TKA 09/13/2015   Other bilateral secondary osteoarthritis of knee 07/14/2014   Heme positive stool 05/21/2013   Osteoarthritis, multiple sites 01/13/2011   Hypertension 12/06/2010   CLAUSTROPHOBIA 03/01/2010   Past Medical History:  Diagnosis Date   Anemia    Atrial flutter ([place]) 01/2020   Cancer ([place])    prostate   COVID 2021   mild case   Dysrhythmia    Aflutter   Heart failure with reduced ejection fraction (HCC)    Hypertension    Hypoglycemia    occ   NICM (nonischemic cardiomyopathy) ([place]) 02/12/2020   Prostate cancer ([place]) 2019   Rheumatoid arthritis ([place])     Family History  Problem Relation Age of Onset   Prostate cancer Father    Prostate cancer Brother    Colon cancer Neg Hx    Rectal cancer Neg Hx    Stomach cancer Neg Hx     Past Surgical History:  Procedure Laterality Date   AMPUTATION Right 02/10/2022   Procedure: RIGHT TRANSMETATARSAL AMPUTATION;  Surgeon: Newt Minion, MD;  Location[place];  Service: Orthopedics;  Laterality: Right;   AMPUTATION Right 03/10/2022   Procedure: RIGHT FOOT CHOPART AMPUTATION;  Surgeon: Newt Minion, MD;  Location: [place];  Service:  Orthopedics;  Laterality: Right;   AMPUTATION TOE Right 03/26/2020   Procedure: Right 3rd toe partial amputation, bone biopsy right 1st metatarsal;  Surgeon: Evelina Bucy, DPM;  [facility];  Service: Podiatry;  Laterality: Right;   BUBBLE STUDY  02/11/2020   Procedure: BUBBLE STUDY;  Surgeon: Geralynn Rile, MD;  Location: [place];  Service: Cardiovascular;;   CARDIOVERSION N/A 02/11/2020   Procedure: CARDIOVERSION;  Surgeon: Geralynn Rile, MD;  Location: [place];  Service: Cardiovascular;  Laterality: N/A;   CARDIOVERSION N/A 08/31/2021   Procedure: CARDIOVERSION;  Surgeon: Jerline Pain, MD;  Location: [facility]  ENDOSCOPY;  Service: Cardiovascular;  Laterality: N/A;   CYSTOSCOPY  04/11/2018   Procedure: Erlene Quan;  Surgeon: [name], MD;  Location: [facility];  Service: Urology;;  NO SEEDS FOUND IN BLADDER   HERNIA REPAIR  2009   inguinal   INTRAMEDULLARY (IM) NAIL INTERTROCHANTERIC Right 05/05/2020   Procedure: CPT 27245-Cephalomedullary nailing of right intertrochanteric femur fracture;  Surgeon: Shona Needles, MD;  Location: [place];  Service: Orthopedics;  Laterality: Right;   INTRAMEDULLARY (IM) NAIL INTERTROCHANTERIC Right 12/24/2020   Procedure: REVISION FIXATION OF RIGHT INTERTROCHANTERIC FEMUR NONUNION;  Surgeon: Shona Needles, MD;  Location: [place];  Service: Orthopedics;  Laterality: Right;   IRRIGATION AND DEBRIDEMENT FOOT Left 03/26/2020   Procedure: Left foot incision and drainage with removal of all non-viable soft tissue and bone - areas overlying the 2nd and 5th metatarsals.;  Surgeon: Evelina Bucy, DPM;  [facility];  Service: Podiatry;  Laterality: Left;   RADIOACTIVE SEED IMPLANT N/A 04/11/2018   Procedure: RADIOACTIVE SEED IMPLANT/BRACHYTHERAPY IMPLANT;  Surgeon: [name], MD;  Location: [facility];  Service: Urology;  Laterality: N/A;   78     SEEDS IMPLANTED   RIGHT/LEFT HEART CATH AND CORONARY ANGIOGRAPHY N/A 02/09/2020   Procedure: RIGHT/LEFT HEART CATH AND CORONARY ANGIOGRAPHY;  Surgeon: Nelva Bush, MD;  Location: [place] [facility];  Service: Cardiovascular;  Laterality: N/A;   SPACE OAR INSTILLATION N/A 04/11/2018   Procedure: SPACE OAR INSTILLATION;  Surgeon: [name], MD;  Location: [facility];  Service: Urology;  Laterality: N/A;   TEE WITHOUT CARDIOVERSION N/A 02/11/2020   Procedure: TRANSESOPHAGEAL ECHOCARDIOGRAM (TEE);  Surgeon: Geralynn Rile, MD;  Location: [place];  Service: Cardiovascular;  Laterality: N/A;   TOTAL KNEE ARTHROPLASTY Bilateral  09/13/2015   Procedure: BILATERAL KNEE ARTHROPLASTY ;  Surgeon: Paralee Cancel, MD;  [facility];  Service: Orthopedics;  Laterality: Bilateral;   Social History   Occupational History   Not on file  Tobacco Use   Smoking status: Never   Smokeless tobacco: Never  Vaping Use   Vaping Use: Never used  Substance and Sexual Activity   Alcohol use: Yes    Comment: 2 beers a week (occasionally)   Drug use: Not Currently   Sexual activity: Yes

## 2022-03-19 ENCOUNTER — Other Ambulatory Visit: Payer: Self-pay | Admitting: Orthopedic Surgery

## 2022-03-22 ENCOUNTER — Telehealth: Payer: Self-pay | Admitting: Cardiology

## 2022-03-22 NOTE — Telephone Encounter (Signed)
Pt is calling to seek advisement on his complaints of sob and wheezing over the last couple days.   Pt states when he breaths in, he can hear "crackling and wheezing in his chest."  He states he had foot amputation about 2-3 weeks ago.   He states he has no chest pain, palpitations, fluttering, diaphoresis, N/V, orthopnea, or syncopal episodes.   He states he is periodically getting dizzy.  He states he is sob and has been for a couple days.   He denies any edema to his upper or lower extremities.  He states his wound from his foot amputation is healing great.  He denied acute weight gain.   He denies being in any acute distress at this time.  He states he is unsure if he has something respiratory going or having complications post-op.   Pt would like advisement on this.   Advised the pt to go to the ER for further evaluation, given his complaints of sob, wheezing, and recent surgery.  Advised him he needs imaging and blood work done to further evaluate his complaints.   Pt verbalized understanding and agrees with this plan.  Pt will go to the ER now via private vehicle by his family member.   He is aware I will make Dr. Johney Frame aware of this plan.

## 2022-03-22 NOTE — Telephone Encounter (Signed)
  Pt c/o Shortness Of Breath: STAT if SOB developed within the last 24 hours or pt is noticeably SOB on the phone  1. Are you currently SOB (can you hear that pt is SOB on the phone)? No   2. How long have you been experiencing SOB? Couple of days ago   3. Are you SOB when sitting or when up moving around? Sitting   4. Are you currently experiencing any other symptoms? Lightheadedness, crackles in lungs when breathing   Pt said, he gets SOB and when he tries to take a deep breath he hears a loud crackle noise in his chest , also he gets lightheaded to the point that he feels like he is going to passed out. He wants to check in with Dr. Johney Frame what he needs to do

## 2022-03-28 ENCOUNTER — Encounter: Payer: Self-pay | Admitting: Orthopedic Surgery

## 2022-03-28 ENCOUNTER — Encounter: Payer: Self-pay | Admitting: Family

## 2022-03-28 ENCOUNTER — Ambulatory Visit (INDEPENDENT_AMBULATORY_CARE_PROVIDER_SITE_OTHER): Payer: Medicare Other | Admitting: Family

## 2022-03-28 DIAGNOSIS — T8781 Dehiscence of amputation stump: Secondary | ICD-10-CM

## 2022-03-28 MED ORDER — DOXYCYCLINE HYCLATE 100 MG PO TABS: 100.0000 mg | ORAL_TABLET | Freq: Two times a day (BID) | ORAL | 0 refills | Status: DC

## 2022-03-28 NOTE — Progress Notes (Signed)
Office Visit Note   Patient: Thomas [name]           Date of Birth: [birth year]           MRN: [id number] Visit Date: 03/13/2022              Requested by: [name], MD [address] [place],  [place] [address] PCP: [name], MD  Chief Complaint  Patient presents with   Right Foot - Routine Post Op    03/10/22 right chopart amputation      HPI: Patient is a 71 year old gentleman who presents several days status post right midfoot amputation revision.  Patient states his wound VAC is leaking.  Assessment & Plan: Visit Diagnoses:  1. History of transmetatarsal amputation of right foot ([place])   2. Dehiscence of amputation stump of right lower extremity (HCC)     Plan: A new canister was applied there is a good suction fit.  Follow-Up Instructions: Return in about 1 week (around 03/20/2022).   Ortho Exam  Patient is alert, oriented, no adenopathy, well-dressed, normal affect, normal respiratory effort. Examination the wound VAC dressing is intact.  Imaging: No results found. No images are attached to the encounter.  Labs: Lab Results  Component Value Date   HGBA1C 6.2 (H) 02/03/2020   HGBA1C 5.3 10/28/2015   HGBA1C 5.6 08/10/2014   ESRSEDRATE 14 06/21/2021   ESRSEDRATE 17 05/18/2021   ESRSEDRATE 20 (H) 03/28/2020   CRP 19.2 (H) 06/21/2021   CRP 11.1 (H) 05/18/2021   CRP 2.7 (H) 03/28/2020   REPTSTATUS 03/31/2020 FINAL 03/26/2020   GRAMSTAIN NO WBC SEEN NO ORGANISMS SEEN  03/26/2020   CULT  03/26/2020    No growth aerobically or anaerobically. Performed at [facility], [place] [address] [place], [place] [address]    LABORGA STAPHYLOCOCCUS AUREUS 03/26/2020     Lab Results  Component Value Date   ALBUMIN 3.0 (L) 03/26/2020   ALBUMIN 3.6 03/25/2020   ALBUMIN 4.1 03/24/2020    Lab Results  Component Value Date   MG 1.8 05/22/2021   MG 2.0 02/12/2020   MG 1.7 02/11/2020   Lab Results  Component Value Date    VD25OH 28.61 (L) 05/05/2020    No results found for: "PREALBUMIN"    Latest Ref Rng & Units 03/10/2022   10:10 AM 02/03/2022    2:24 PM 08/26/2021   10:15 AM  CBC EXTENDED  WBC 4.0 - 10.5 K/uL 5.1  10.6  9.0   RBC 4.22 - 5.81 MIL/uL 4.80  4.84  4.89   Hemoglobin 13.0 - 17.0 g/dL 11.2  11.7  12.1   HCT 39.0 - 52.0 % 37.4  39.6  38.6   Platelets 150 - 400 K/uL 370  311  337      There is no height or weight on file to calculate BMI.  Orders:  No orders of the defined types were placed in this encounter.  No orders of the defined types were placed in this encounter.    Procedures: No procedures performed  Clinical Data: No additional findings.  ROS:  All other systems negative, except as noted in the HPI. Review of Systems  Objective: Vital Signs: There were no vitals taken for this visit.  Specialty Comments:  No specialty comments available.  PMFS History: Patient Active Problem List   Diagnosis Date Noted   Dehiscence of amputation stump ([place]) 03/10/2022   History of transmetatarsal amputation of right foot ([place])  02/15/2022   Chronic osteomyelitis of toe, right ([place]) 02/10/2022   Fracture of intertrochanteric section of femur, closed, right, with nonunion, subsequent encounter 12/24/2020   Inguinal hernia 10/18/2020   Chronic combined systolic and diastolic CHF, NYHA class 2 ([place])    Osteoporosis 05/05/2020   Foot infection    NICM (nonischemic cardiomyopathy) ([place]), no significant CAD on cardiac cath   02/12/2020   Right foot pain    Rheumatoid arthritis ([place])    Atrial flutter ([place]) 02/02/2020   Lower extremity edema 12/30/2019   Weight loss, non-intentional 11/24/2019   Chronic right hip pain 06/19/2019   Degenerative tear of acetabular labrum of right hip 05/02/2019   Primary osteoarthritis of right hip 05/02/2019   Skin ulcer ([place]) 01/29/2019   Malignant neoplasm of prostate ([place]) 01/16/2018   Insomnia 10/02/2017   S/P bilateral TKA 09/13/2015    Other bilateral secondary osteoarthritis of knee 07/14/2014   Heme positive stool 05/21/2013   Osteoarthritis, multiple sites 01/13/2011   Hypertension 12/06/2010   CLAUSTROPHOBIA 03/01/2010   Past Medical History:  Diagnosis Date   Anemia    Atrial flutter ([place]) 01/2020   Cancer ([place])    prostate   COVID 2021   mild case   Dysrhythmia    Aflutter   Heart failure with reduced ejection fraction (HCC)    Hypertension    Hypoglycemia    occ   NICM (nonischemic cardiomyopathy) ([place]) 02/12/2020   Prostate cancer ([place]) 2019   Rheumatoid arthritis ([place])     Family History  Problem Relation Age of Onset   Prostate cancer Father    Prostate cancer Brother    Colon cancer Neg Hx    Rectal cancer Neg Hx    Stomach cancer Neg Hx     Past Surgical History:  Procedure Laterality Date   AMPUTATION Right 02/10/2022   Procedure: RIGHT TRANSMETATARSAL AMPUTATION;  Surgeon: Newt Minion, MD;  Location: [place];  Service: Orthopedics;  Laterality: Right;   AMPUTATION Right 03/10/2022   Procedure: RIGHT FOOT CHOPART AMPUTATION;  Surgeon: Newt Minion, MD;  Location: [place];  Service: Orthopedics;  Laterality: Right;   AMPUTATION TOE Right 03/26/2020   Procedure: Right 3rd toe partial amputation, bone biopsy right 1st metatarsal;  Surgeon: Evelina Bucy, DPM;  [facility];  Service: Podiatry;  Laterality: Right;   BUBBLE STUDY  02/11/2020   Procedure: BUBBLE STUDY;  Surgeon: Geralynn Rile, MD;  Location: [place];  Service: Cardiovascular;;   CARDIOVERSION N/A 02/11/2020   Procedure: CARDIOVERSION;  Surgeon: Geralynn Rile, MD;  Location: [place];  Service: Cardiovascular;  Laterality: N/A;   CARDIOVERSION N/A 08/31/2021   Procedure: CARDIOVERSION;  Surgeon: Jerline Pain, MD;  Location: [facility];  Service: Cardiovascular;  Laterality: N/A;   CYSTOSCOPY  04/11/2018   Procedure: Erlene Quan;  Surgeon: [name], MD;  Location: [facility];  Service: Urology;;  NO SEEDS FOUND IN BLADDER   HERNIA REPAIR  2009   inguinal   INTRAMEDULLARY (IM) NAIL INTERTROCHANTERIC Right 05/05/2020   Procedure: CPT 27245-Cephalomedullary nailing of right intertrochanteric femur fracture;  Surgeon: Shona Needles, MD;  Location: [place];  Service: Orthopedics;  Laterality: Right;   INTRAMEDULLARY (IM) NAIL INTERTROCHANTERIC Right 12/24/2020   Procedure: REVISION FIXATION OF RIGHT INTERTROCHANTERIC FEMUR NONUNION;  Surgeon: Shona Needles, MD;  Location: [place];  Service: Orthopedics;  Laterality: Right;   IRRIGATION AND DEBRIDEMENT FOOT Left 03/26/2020   Procedure: Left foot incision  and drainage with removal of all non-viable soft tissue and bone - areas overlying the 2nd and 5th metatarsals.;  Surgeon: Evelina Bucy, DPM;  [facility];  Service: Podiatry;  Laterality: Left;   RADIOACTIVE SEED IMPLANT N/A 04/11/2018   Procedure: RADIOACTIVE SEED IMPLANT/BRACHYTHERAPY IMPLANT;  Surgeon: [name], MD;  Location: [facility];  Service: Urology;  Laterality: N/A;   41     SEEDS IMPLANTED   RIGHT/LEFT HEART CATH AND CORONARY ANGIOGRAPHY N/A 02/09/2020   Procedure: RIGHT/LEFT HEART CATH AND CORONARY ANGIOGRAPHY;  Surgeon: Nelva Bush, MD;  Location: [place] [facility];  Service: Cardiovascular;  Laterality: N/A;   SPACE OAR INSTILLATION N/A 04/11/2018   Procedure: SPACE OAR INSTILLATION;  Surgeon: [name], MD;  Location: [facility];  Service: Urology;  Laterality: N/A;   TEE WITHOUT CARDIOVERSION N/A 02/11/2020   Procedure: TRANSESOPHAGEAL ECHOCARDIOGRAM (TEE);  Surgeon: Geralynn Rile, MD;  Location: [place];  Service: Cardiovascular;  Laterality: N/A;   TOTAL KNEE ARTHROPLASTY Bilateral 09/13/2015   Procedure: BILATERAL KNEE ARTHROPLASTY ;  Surgeon: Paralee Cancel, MD;  [facility];  Service: Orthopedics;  Laterality: Bilateral;   Social History    Occupational History   Not on file  Tobacco Use   Smoking status: Never   Smokeless tobacco: Never  Vaping Use   Vaping Use: Never used  Substance and Sexual Activity   Alcohol use: Yes    Comment: 2 beers a week (occasionally)   Drug use: Not Currently   Sexual activity: Yes

## 2022-03-28 NOTE — Progress Notes (Signed)
Post-Op Visit Note   Patient: Thomas [name]           Date of Birth: 02-20-1951           MRN: [id number] Visit Date: 03/28/2022 PCP: [name], MD  Chief Complaint:  Chief Complaint  Patient presents with   Right Foot - Routine Post Op    03/10/2022 right Chopart  amputation    HPI:  HPI The patient is a 71 year old gentleman seen status post right Chopart amputation.  He has had dehiscence of his incision continues full weightbearing in a postop shoe dry dressings daily.  States he completed a course of doxycycline yesterday Ortho Exam On examination of the right foot sutures are in place he does have dehiscence of his amputation this is worst medially open about 1 cm there is no area that probes.  No exposed bone or tendon.  There is mild surrounding erythema and warmth this does not extend past the midfoot.  Visit Diagnoses: No diagnosis found.  Plan: Unable to take sulfa due to cross sensitivity with his methotrexate, allergic to cefadroxil as well.  Will place back on a course of Doxy like cycling.  Continue daily Dial soap cleansing packing open.  Follow-Up Instructions: No follow-ups on file.   Imaging: No results found.  Orders:  No orders of the defined types were placed in this encounter.  Meds ordered this encounter  Medications   doxycycline (VIBRA-TABS) 100 MG tablet    Sig: Take 1 tablet (100 mg total) by mouth 2 (two) times daily.    Dispense:  60 tablet    Refill:  0     PMFS History: Patient Active Problem List   Diagnosis Date Noted   Dehiscence of amputation stump ([place]) 03/10/2022   History of transmetatarsal amputation of right foot ([place]) 02/15/2022   Chronic osteomyelitis of toe, right ([place]) 02/10/2022   Fracture of intertrochanteric section of femur, closed, right, with nonunion, subsequent encounter 12/24/2020   Inguinal hernia 10/18/2020   Chronic combined systolic and diastolic CHF, NYHA class 2 ([place])    Osteoporosis  05/05/2020   Foot infection    NICM (nonischemic cardiomyopathy) ([place]), no significant CAD on cardiac cath   02/12/2020   Right foot pain    Rheumatoid arthritis ([place])    Atrial flutter ([place]) 02/02/2020   Lower extremity edema 12/30/2019   Weight loss, non-intentional 11/24/2019   Chronic right hip pain 06/19/2019   Degenerative tear of acetabular labrum of right hip 05/02/2019   Primary osteoarthritis of right hip 05/02/2019   Skin ulcer ([place]) 01/29/2019   Malignant neoplasm of prostate ([place]) 01/16/2018   Insomnia 10/02/2017   S/P bilateral TKA 09/13/2015   Other bilateral secondary osteoarthritis of knee 07/14/2014   Heme positive stool 05/21/2013   Osteoarthritis, multiple sites 01/13/2011   Hypertension 12/06/2010   CLAUSTROPHOBIA 03/01/2010   Past Medical History:  Diagnosis Date   Anemia    Atrial flutter ([place]) 01/2020   Cancer ([place])    prostate   COVID 2021   mild case   Dysrhythmia    Aflutter   Heart failure with reduced ejection fraction (HCC)    Hypertension    Hypoglycemia    occ   NICM (nonischemic cardiomyopathy) ([place]) 02/12/2020   Prostate cancer ([place]) 2019   Rheumatoid arthritis ([place])     Family History  Problem Relation Age of Onset   Prostate cancer Father    Prostate cancer Brother    Colon cancer  Neg Hx    Rectal cancer Neg Hx    Stomach cancer Neg Hx     Past Surgical History:  Procedure Laterality Date   AMPUTATION Right 02/10/2022   Procedure: RIGHT TRANSMETATARSAL AMPUTATION;  Surgeon: Newt Minion, MD;  Location: [place];  Service: Orthopedics;  Laterality: Right;   AMPUTATION Right 03/10/2022   Procedure: RIGHT FOOT CHOPART AMPUTATION;  Surgeon: Newt Minion, MD;  Location: [place];  Service: Orthopedics;  Laterality: Right;   AMPUTATION TOE Right 03/26/2020   Procedure: Right 3rd toe partial amputation, bone biopsy right 1st metatarsal;  Surgeon: Evelina Bucy, DPM;  [facility];  Service: Podiatry;  Laterality: Right;   BUBBLE  STUDY  02/11/2020   Procedure: BUBBLE STUDY;  Surgeon: Geralynn Rile, MD;  Location: [place];  Service: Cardiovascular;;   CARDIOVERSION N/A 02/11/2020   Procedure: CARDIOVERSION;  Surgeon: Geralynn Rile, MD;  Location: [place];  Service: Cardiovascular;  Laterality: N/A;   CARDIOVERSION N/A 08/31/2021   Procedure: CARDIOVERSION;  Surgeon: Jerline Pain, MD;  Location: [facility];  Service: Cardiovascular;  Laterality: N/A;   CYSTOSCOPY  04/11/2018   Procedure: Erlene Quan;  Surgeon: [name], MD;  Location: [facility];  Service: Urology;;  NO SEEDS FOUND IN BLADDER   HERNIA REPAIR  2009   inguinal   INTRAMEDULLARY (IM) NAIL INTERTROCHANTERIC Right 05/05/2020   Procedure: CPT 27245-Cephalomedullary nailing of right intertrochanteric femur fracture;  Surgeon: Shona Needles, MD;  Location: [place];  Service: Orthopedics;  Laterality: Right;   INTRAMEDULLARY (IM) NAIL INTERTROCHANTERIC Right 12/24/2020   Procedure: REVISION FIXATION OF RIGHT INTERTROCHANTERIC FEMUR NONUNION;  Surgeon: Shona Needles, MD;  Location: [place];  Service: Orthopedics;  Laterality: Right;   IRRIGATION AND DEBRIDEMENT FOOT Left 03/26/2020   Procedure: Left foot incision and drainage with removal of all non-viable soft tissue and bone - areas overlying the 2nd and 5th metatarsals.;  Surgeon: Evelina Bucy, DPM;  [facility];  Service: Podiatry;  Laterality: Left;   RADIOACTIVE SEED IMPLANT N/A 04/11/2018   Procedure: RADIOACTIVE SEED IMPLANT/BRACHYTHERAPY IMPLANT;  Surgeon: [name], MD;  Location: [facility];  Service: Urology;  Laterality: N/A;   11     SEEDS IMPLANTED   RIGHT/LEFT HEART CATH AND CORONARY ANGIOGRAPHY N/A 02/09/2020   Procedure: RIGHT/LEFT HEART CATH AND CORONARY ANGIOGRAPHY;  Surgeon: Nelva Bush, MD;  Location: [place] [facility];  Service: Cardiovascular;  Laterality: N/A;   SPACE OAR INSTILLATION N/A  04/11/2018   Procedure: SPACE OAR INSTILLATION;  Surgeon: [name], MD;  Location: [facility];  Service: Urology;  Laterality: N/A;   TEE WITHOUT CARDIOVERSION N/A 02/11/2020   Procedure: TRANSESOPHAGEAL ECHOCARDIOGRAM (TEE);  Surgeon: Geralynn Rile, MD;  Location: [place];  Service: Cardiovascular;  Laterality: N/A;   TOTAL KNEE ARTHROPLASTY Bilateral 09/13/2015   Procedure: BILATERAL KNEE ARTHROPLASTY ;  Surgeon: Paralee Cancel, MD;  [facility];  Service: Orthopedics;  Laterality: Bilateral;   Social History   Occupational History   Not on file  Tobacco Use   Smoking status: Never   Smokeless tobacco: Never  Vaping Use   Vaping Use: Never used  Substance and Sexual Activity   Alcohol use: Yes    Comment: 2 beers a week (occasionally)   Drug use: Not Currently   Sexual activity: Yes

## 2022-03-29 NOTE — Progress Notes (Deleted)
Cardiology Office Note:    Date:  03/29/2022   ID:  Thomas [name], DOB 1950-11-08, MRN [id number]  PCP:  [name], MD  [facility] Cardiologist:  Freada Bergeron, MD  [facility] Electrophysiologist:  None   Referring MD: [name], *   History of Present Illness:    Thomas [name] is a 71 y.o. male with a hx of of chronic systolic CHF/non-ischemic cardiomyopathy with EF of <20%, atrial flutter s/p DCCV in 01/2020 on Amiodarone and Eliquis, hypertension, history of osteomyelitis s/p multiple toe amputations, rheumatoid arthritis, prostate cancer, and anxiety/depression who presents to clinic for follow-up.  He was admitted from 02/02/2020-02/12/2020 for atrial flutter with RVR and newly diagnosed acute systolic CHF with gross volume overload. Echo showed LVEF of <20% with global hypokinesis, biatrial enlargement, mild MR, and moderate bilateral pleural effusions. RV mildly enlarged but systolic function normal. Patient was diuresed with IV Lasix and then underwent right/left cardiac catheterization on 02/09/2020 which showed minimal CAD, mildly reduced Fick cardiac output/index, and upper normal left/heart pressures. He then underwent successful TEE/DCCV on 02/11/2020. Following DCCV, he became somnolent and hypotensive requiring pressors but rapidly improved and these were able to be weaned off quickly. He was discharged on Torsemide '20mg'$  twice daily, Amiodarone '200mg'$  twice daily, and Eliquis '5mg'$  twice daily. Unfortunately BP limited our ability to add guideline directed medical therapy for CHF.   Patient was seen by Marcellina Millin, NP, for follow-up on 03/31/2020. He reported having left foot surgery the week prior to this visit and had a PICC line placed for antibiotics. He was doing well from a cardiac standpoint. Amiodarone was decreased to '200mg'$  once daily and Torsemide was decreased to '20mg'$  once daily. Lisinopril '10mg'$  once daily was added.   Patient  presented to the ED on 05/05/2019 after a fall and was found to have a right intertrochanteric hip fractures. Cardiology consulted for pre-op evaluation. He underwent the procedure without complications. TTE on that admission with improved LVEF 45-50%.  Seen in clinic on 07/2021 where he was back in atrial flutter. He subsequently underwent DCCV on 08/31/21 with return to NSR.   Was seen by Dr. Lovena Le on 10/05/21 for consideration of ablation. He wanted to think about it further. Repeat TTE 10/2021 with LVEF 55-60%, G1DD, mild aortic dilation.  Today, ***  Past Medical History:  Diagnosis Date   Anemia    Atrial flutter ([place]) 01/2020   Cancer ([place])    prostate   COVID 2021   mild case   Dysrhythmia    Aflutter   Heart failure with reduced ejection fraction (HCC)    Hypertension    Hypoglycemia    occ   NICM (nonischemic cardiomyopathy) ([place]) 02/12/2020   Prostate cancer ([place]) 2019   Rheumatoid arthritis ([place])     Past Surgical History:  Procedure Laterality Date   AMPUTATION Right 02/10/2022   Procedure: RIGHT TRANSMETATARSAL AMPUTATION;  Surgeon: Newt Minion, MD;  Location: [place];  Service: Orthopedics;  Laterality: Right;   AMPUTATION Right 03/10/2022   Procedure: RIGHT FOOT CHOPART AMPUTATION;  Surgeon: Newt Minion, MD;  Location: [place];  Service: Orthopedics;  Laterality: Right;   AMPUTATION TOE Right 03/26/2020   Procedure: Right 3rd toe partial amputation, bone biopsy right 1st metatarsal;  Surgeon: Evelina Bucy, DPM;  [facility];  Service: Podiatry;  Laterality: Right;   BUBBLE STUDY  02/11/2020   Procedure: BUBBLE STUDY;  Surgeon: Geralynn Rile, MD;  Location:  [place] [facility];  Service: Cardiovascular;;   CARDIOVERSION N/A 02/11/2020   Procedure: CARDIOVERSION;  Surgeon: Geralynn Rile, MD;  Location: [place];  Service: Cardiovascular;  Laterality: N/A;   CARDIOVERSION N/A 08/31/2021   Procedure: CARDIOVERSION;  Surgeon: Jerline Pain,  MD;  Location: [facility];  Service: Cardiovascular;  Laterality: N/A;   CYSTOSCOPY  04/11/2018   Procedure: Erlene Quan;  Surgeon: [name], MD;  Location: [facility];  Service: Urology;;  NO SEEDS FOUND IN BLADDER   HERNIA REPAIR  2009   inguinal   INTRAMEDULLARY (IM) NAIL INTERTROCHANTERIC Right 05/05/2020   Procedure: CPT 27245-Cephalomedullary nailing of right intertrochanteric femur fracture;  Surgeon: Shona Needles, MD;  Location: [place];  Service: Orthopedics;  Laterality: Right;   INTRAMEDULLARY (IM) NAIL INTERTROCHANTERIC Right 12/24/2020   Procedure: REVISION FIXATION OF RIGHT INTERTROCHANTERIC FEMUR NONUNION;  Surgeon: Shona Needles, MD;  Location: [place];  Service: Orthopedics;  Laterality: Right;   IRRIGATION AND DEBRIDEMENT FOOT Left 03/26/2020   Procedure: Left foot incision and drainage with removal of all non-viable soft tissue and bone - areas overlying the 2nd and 5th metatarsals.;  Surgeon: Evelina Bucy, DPM;  [facility];  Service: Podiatry;  Laterality: Left;   RADIOACTIVE SEED IMPLANT N/A 04/11/2018   Procedure: RADIOACTIVE SEED IMPLANT/BRACHYTHERAPY IMPLANT;  Surgeon: [name], MD;  Location: [facility];  Service: Urology;  Laterality: N/A;   68     SEEDS IMPLANTED   RIGHT/LEFT HEART CATH AND CORONARY ANGIOGRAPHY N/A 02/09/2020   Procedure: RIGHT/LEFT HEART CATH AND CORONARY ANGIOGRAPHY;  Surgeon: Nelva Bush, MD;  Location: [place] [facility];  Service: Cardiovascular;  Laterality: N/A;   SPACE OAR INSTILLATION N/A 04/11/2018   Procedure: SPACE OAR INSTILLATION;  Surgeon: [name], MD;  Location: [facility];  Service: Urology;  Laterality: N/A;   TEE WITHOUT CARDIOVERSION N/A 02/11/2020   Procedure: TRANSESOPHAGEAL ECHOCARDIOGRAM (TEE);  Surgeon: Geralynn Rile, MD;  Location: [place];  Service: Cardiovascular;  Laterality: N/A;   TOTAL KNEE ARTHROPLASTY  Bilateral 09/13/2015   Procedure: BILATERAL KNEE ARTHROPLASTY ;  Surgeon: Paralee Cancel, MD;  [facility];  Service: Orthopedics;  Laterality: Bilateral;    Current Medications: No outpatient medications have been marked as taking for the 03/31/22 encounter (Appointment) with Freada Bergeron, MD.     Allergies:   Cefadroxil, Diltiazem hcl, Chlorthalidone, and Voltaren [diclofenac]   Social History   Socioeconomic History   Marital status: Married    Spouse name: Not on file   Number of children: Not on file   Years of education: Not on file   Highest education level: Not on file  Occupational History   Not on file  Tobacco Use   Smoking status: Never   Smokeless tobacco: Never  Vaping Use   Vaping Use: Never used  Substance and Sexual Activity   Alcohol use: Yes    Comment: 2 beers a week (occasionally)   Drug use: Not Currently   Sexual activity: Yes  Other Topics Concern   Not on file  Social History Narrative   Not on file   Social Determinants of Health   Financial Resource Strain: Not on file  Food Insecurity: Not on file  Transportation Needs: Not on file  Physical Activity: Not on file  Stress: Not on file  Social Connections: Not on file     Family History: The patient's family history includes Prostate cancer in his brother  and father. There is no history of Colon cancer, Rectal cancer, or Stomach cancer.  ROS:   Please see the history of present illness.    Review of Systems  Constitutional:  Negative for chills and fever.  HENT:  Negative for nosebleeds.   Eyes:  Negative for blurred vision.  Respiratory:  Negative for shortness of breath.   Cardiovascular:  Negative for chest pain, palpitations, orthopnea, claudication and PND.  Gastrointestinal:  Negative for blood in stool and melena.  Genitourinary:  Negative for hematuria.  Neurological:  Negative for dizziness and loss of consciousness.  Endo/Heme/Allergies:  Negative for polydipsia.   Psychiatric/Behavioral:  Negative for memory loss.     EKGs/Labs/Other Studies Reviewed:    The following studies were reviewed today: TTE 14-Nov-2021: IMPRESSIONS     1. Limited echo for LV function   2. Left ventricular ejection fraction, by estimation, is 55 to 60%. Left  ventricular ejection fraction by 3D volume is 56 %. The left ventricle has  normal function. The left ventricle has no regional wall motion  abnormalities. There is mild left  ventricular hypertrophy. Left ventricular diastolic parameters are  consistent with Grade I diastolic dysfunction (impaired relaxation).   3. The aortic valve is tricuspid. Aortic valve regurgitation is not  visualized.   4. Aortic dilatation noted. There is mild dilatation of the aortic root,  measuring 41 mm.   Comparison(s): Changes from prior study are noted. 08/18/2021: LVEF 30-35%.   Bilateral LE Arterial Doppler 06/03/2021: Summary:  Right: 30-49% stenosis noted in the anterior tibial artery. 30-49%  stenosis noted in the posterior tibial artery.   Left: Cystic area with peripheral vascularization proximal to medial  malleolus (area of distal PTA) etiology unknown.   Echo 08/27/2020: IMPRESSIONS    1. Left ventricular ejection fraction, by estimation, is 55 to 60%. The  left ventricle has normal function. The left ventricle has no regional  wall motion abnormalities. There is mild left ventricular hypertrophy.  Left ventricular diastolic parameters  are consistent with Grade I diastolic dysfunction (impaired relaxation).   2. Right ventricular systolic function is normal. The right ventricular  size is normal. There is normal pulmonary artery systolic pressure.   3. Right atrial size was moderately dilated.   4. The mitral valve is grossly normal. Trivial mitral valve  regurgitation.   5. The aortic valve is tricuspid. Aortic valve regurgitation is trivial.  Mild aortic valve sclerosis is present, with no evidence of aortic  valve  stenosis.   6. Aortic dilatation noted. There is mild dilatation of the aortic root,  measuring 41 mm.   7. The inferior vena cava is normal in size with greater than 50%  respiratory variability, suggesting right atrial pressure of 3 mmHg.  Echocardiogram 05/06/20: IMPRESSIONS   1. Left ventricular ejection fraction, by estimation, is 45 to 50%. The  left ventricle has mildly decreased function. The left ventricle  demonstrates global hypokinesis. Left ventricular diastolic parameters are  consistent with Grade I diastolic  dysfunction (impaired relaxation).   2. Right ventricular systolic function is normal. The right ventricular  size is normal. There is normal pulmonary artery systolic pressure. The  estimated right ventricular systolic pressure is 14.9 mmHg.   3. The mitral valve is normal in structure. No evidence of mitral valve  regurgitation.   4. The aortic valve was not well visualized. Aortic valve regurgitation  is not visualized. No aortic stenosis is present.   5. The inferior vena cava is dilated in  size with >50% respiratory  variability, suggesting right atrial pressure of 8 mmHg.   Echocardiogram 02/02/2020: Impression: 1. While there is no visualized LV thrombus on sweeps of the apex,  patient did not have IV access, unable to use echo contrast for  confirmation. Left ventricular ejection fraction, by estimation, is <20%.  The left ventricle has severely decreased  function. The left ventricle demonstrates global hypokinesis. The left  ventricular internal cavity size was moderately dilated. Left ventricular  diastolic function could not be evaluated.   2. Right ventricular systolic function is normal. The right ventricular  size is mildly enlarged. There is mildly elevated pulmonary artery  systolic pressure.   3. Left atrial size was severely dilated.   4. Right atrial size was severely dilated.   5. Moderate pleural effusion in both left and right  lateral regions.   6. The mitral valve is normal in structure. Mild mitral valve  regurgitation.   7. The aortic valve is tricuspid. There is mild calcification of the  aortic valve. Aortic valve regurgitation is not visualized. Mild aortic  valve sclerosis is present, with no evidence of aortic valve stenosis.   8. The inferior vena cava is dilated in size with <50% respiratory  variability, suggesting right atrial pressure of 15 mmHg.   Conclusion(s)/Recommendation(s): Patient in atrial flutter with RVR during  study. Discussed severely reduced EF with Dr. Gasper Sells, cardiologist  on call.  _______________   Right/Left Cardiac Catheterization 02/09/2020: Conclusions: No angiographically significant coronary artery disease. Upper normal left heart, right heart, and pulmonary artery pressures. Mildly reduced Fick cardiac output/index.   Recommendations: Restart heparin infusion 2 hours after TR band removal.  If there is no evidence of bleeding/vascular complication, consider transitioning back to apixaban as soon as tomorrow. Proceed with TEE/cardioversion as soon as tomorrow.  I will make Thomas [name] NPO after midnight in anticipation of this. Optimize evidence-based heart failure therapy for non-ischemic cardiomyopathy. Primary prevention of coronary artery disease.   Diagnostic Dominance: Right       EKG: EKG is personally reviewed. 08/02/2021: Aflutter. Rate 89 bpm.  05/22/2021: (ED)  probable atrial flutter Anteroseptal infarct, age indeterminate ST elevation, consider inferior injury Prolonged QT interval similar to prior   Recent Labs: 05/22/2021: Magnesium 1.8 06/21/2021: ALT 12 03/10/2022: BUN 10; Creatinine, Ser 0.75; Hemoglobin 11.2; Platelets 370; Potassium 3.8; Sodium 142  Recent Lipid Panel    Component Value Date/Time   CHOL 111 02/04/2020 0452   TRIG 72 02/04/2020 0452   HDL 32 (L) 02/04/2020 0452   CHOLHDL 3.5 02/04/2020 0452   VLDL 14 02/04/2020 0452    LDLCALC 65 02/04/2020 0452   LDLDIRECT 94 12/06/2010 0917     Risk Assessment/Calculations:    CHA2DS2-VASc Score = 4  }This indicates a 4.8% annual risk of stroke. The patient's score is based upon: CHF History: 1 HTN History: 1 Diabetes History: 0 Stroke History: 0 Vascular Disease History: 1 Age Score: 1 Gender Score: 0    Physical Exam:    VS:  There were no vitals taken for this visit.    Wt Readings from Last 3 Encounters:  03/10/22 220 lb (99.8 kg)  02/10/22 228 lb (103.4 kg)  02/03/22 227 lb (103 kg)     GEN:  Well nourished, well developed in no acute distress HEENT: Normal NECK: No JVD; No carotid bruits CARDIAC: RRR, no murmurs, rubs, gallops RESPIRATORY:  Clear to auscultation without rales, wheezing or rhonchi  ABDOMEN: Soft, non-tender, non-distended MUSCULOSKELETAL:  Trace  LE edema, warm SKIN: Warm and dry NEUROLOGIC:  Alert and oriented x 3 PSYCHIATRIC:  Normal affect   ASSESSMENT:    No diagnosis found.  PLAN:    In order of problems listed above:  #Non-ischemic cardiomyopathy: #Chronic systolic heart failure with improved EF 20-->55-60% TTE with LVEF <20% in the setting of Aflutter with RVR with suspected tachycardiac induced CM. Clean coronaries on cath.  Repeat TTE on 08/2020 and 10/2021 with LVEF 55-60, G1DD. Patient currently appears compensated on exam with no HF symptoms. -Off torsemide -Continue metop '25mg'$  XL daily -Continue lisionpril '10mg'$  daily -Low Na diet  #Aflutter/Flib: #Secondary Hypercoaguable State: CHADs-vasc 3. S/p TEE with successful DCCV on 38/88/28 complicated by hypotension which resolved by the next day. Developed recurrent Aflutter in 07/2021 with successful DCCV in 08/2021. Saw Dr. Lovena Le and is contemplating ablation. -Encouraged ablation -Continue amiodarone '200mg'$  daily; will hopefully wean off once back in NSR -Continue metop '25mg'$  XL daily -Continue apixaban '5mg'$  BID  #HLD: -Continue lipitor '40mg'$  daily    #Chronic Pain: -Follow-up with pain management clinic   #Right Intertrochanteric Femur Fracture: S/p cephalomedullary nailing with Dr. Doreatha Martin on 05/05/20.  -Follow-up with ortho as scheduled     Follow-up 6 months  Medication Adjustments/Labs and Tests Ordered: Current medicines are reviewed at length with the patient today.  Concerns regarding medicines are outlined above.  No orders of the defined types were placed in this encounter.  No orders of the defined types were placed in this encounter.   There are no Patient Instructions on file for this visit.     Signed, Freada Bergeron, MD  03/29/2022 10:55 AM    [place]

## 2022-03-31 ENCOUNTER — Ambulatory Visit: Payer: Medicare Other | Attending: Cardiology | Admitting: Cardiology

## 2022-03-31 ENCOUNTER — Encounter: Payer: Self-pay | Admitting: Cardiology

## 2022-03-31 VITALS — BP 128/78 | HR 91 | Ht >= 80 in | Wt 212.0 lb

## 2022-03-31 DIAGNOSIS — Z79899 Other long term (current) drug therapy: Secondary | ICD-10-CM | POA: Diagnosis not present

## 2022-03-31 DIAGNOSIS — E785 Hyperlipidemia, unspecified: Secondary | ICD-10-CM

## 2022-03-31 DIAGNOSIS — I10 Essential (primary) hypertension: Secondary | ICD-10-CM | POA: Diagnosis not present

## 2022-03-31 DIAGNOSIS — I428 Other cardiomyopathies: Secondary | ICD-10-CM

## 2022-03-31 DIAGNOSIS — D6869 Other thrombophilia: Secondary | ICD-10-CM

## 2022-03-31 DIAGNOSIS — I484 Atypical atrial flutter: Secondary | ICD-10-CM | POA: Diagnosis not present

## 2022-03-31 DIAGNOSIS — I5042 Chronic combined systolic (congestive) and diastolic (congestive) heart failure: Secondary | ICD-10-CM

## 2022-03-31 MED ORDER — AMIODARONE HCL 100 MG PO TABS: 100.0000 mg | ORAL_TABLET | Freq: Every day | ORAL | 1 refills | Status: DC

## 2022-03-31 NOTE — Patient Instructions (Signed)
Medication Instructions:   DECREASE YOUR AMIODARONE TO 100 MG BY MOUTH DAILY  *If you need a refill on your cardiac medications before your next appointment, please call your pharmacy*   Lab Work:  [place] [facility] HERE IN THE OFFICE--CMET AND TSH LEVEL--IF DR. DUDA AGREES TO CHECK THIS AT YOUR VISIT WITH HIM IN [place], HE CAN AND YOU CAN LET us KNOW THIS SO WE CAN SEE THE RESULTS  If you have labs (blood work) drawn today and your tests are completely normal, you will receive your results only by: [place] (if you have MyChart) OR A paper copy in the mail If you have any lab test that is abnormal or we need to change your treatment, we will call you to review the results.   Follow-Up:  3 MONTHS WITH AN EXTENDER IN THE OFFICE--SCHEDULE WITH ERNEST DICK NP OR Christen Bame NP   Important Information About Sugar

## 2022-03-31 NOTE — Progress Notes (Signed)
Cardiology Office Note:    Date:  03/31/2022   ID:  [name], DOB 23-May-1950, MRN [id number]  PCP:  [name], MD  [facility] Cardiologist:  Freada Bergeron, MD  [facility] Electrophysiologist:  None   Referring MD: [name], *   History of Present Illness:    Thomas [name] is a 70 y.o. male with a hx of of chronic systolic CHF/non-ischemic cardiomyopathy with EF of <20%, atrial flutter s/p DCCV in 01/2020 on Amiodarone and Eliquis, hypertension, history of osteomyelitis s/p multiple toe amputations, rheumatoid arthritis, prostate cancer, and anxiety/depression who presents to clinic for follow-up.  He was admitted from 02/02/2020-02/12/2020 for atrial flutter with RVR and newly diagnosed acute systolic CHF with gross volume overload. Echo showed LVEF of <20% with global hypokinesis, biatrial enlargement, mild MR, and moderate bilateral pleural effusions. RV mildly enlarged but systolic function normal. Patient was diuresed with IV Lasix and then underwent right/left cardiac catheterization on 02/09/2020 which showed minimal CAD, mildly reduced Fick cardiac output/index, and upper normal left/heart pressures. He then underwent successful TEE/DCCV on 02/11/2020. Following DCCV, he became somnolent and hypotensive requiring pressors but rapidly improved and these were able to be weaned off quickly. He was discharged on Torsemide 4m twice daily, Amiodarone 2087mtwice daily, and Eliquis 67m63mwice daily. Unfortunately BP limited our ability to add guideline directed medical therapy for CHF.   Patient was seen by LorMarcellina MillinP, for follow-up on 03/31/2020. He reported having left foot surgery the week prior to this visit and had a PICC line placed for antibiotics. He was doing well from a cardiac standpoint. Amiodarone was decreased to 200m567mce daily and Torsemide was decreased to 20mg68me daily. Lisinopril 10mg 3m daily was added.   Patient  presented to the ED on 05/05/2019 after a fall and was found to have a right intertrochanteric hip fractures. Cardiology consulted for pre-op evaluation. He underwent the procedure without complications. TTE on that admission with improved LVEF 45-50%.  Seen in clinic on 07/2021 where he was back in atrial flutter. He subsequently underwent DCCV on 08/31/21 with return to NSR.   Was seen by Dr. TaylorLovena Le/14/23 for consideration of ablation. He wanted to think about it further. Repeat TTE 10/2021 with LVEF 55-60%, G1DD, mild aortic dilation.  Today, the patient states that he is slowly healing from his RLE foot surgery. He is hoping it heals soon as he also needs a right hip replacement.  Otherwise, he is doing very well from a CV standpoint. Has occasional LE edema in the right foot following surgery but no LLE swelling. He denies any palpitations, chest pain, shortness of breath, or peripheral edema. No lightheadedness, headaches, syncope, orthopnea, or PND.  He is tolerating his blood thinner medications. He discontinued his Lepitor due to his cholesterol levels being below 100.    Past Medical History:  Diagnosis Date   Anemia    Atrial flutter (HCC) 1[place]021   [place] (HCC)  [place]   COVID 2021   mild case   Dysrhythmia    Aflutter   Heart failure with reduced ejection fraction (HCC)    Hypertension    Hypoglycemia    occ   NICM (nonischemic cardiomyopathy) (HCC) 1[place]/2021   Prostate cancer (HCC) 2[place]   Rheumatoid arthritis (HCC)  [place]:  Procedure Laterality Date   AMPUTATION Right 02/10/2022   Procedure: RIGHT TRANSMETATARSAL AMPUTATION;  Surgeon: Duda, Newt Minion  Location: [place];  Service: Orthopedics;  Laterality: Right;   AMPUTATION Right 03/10/2022   Procedure: RIGHT FOOT CHOPART AMPUTATION;  Surgeon: Newt Minion, MD;  Location: [place];  Service: Orthopedics;  Laterality: Right;   AMPUTATION TOE Right 03/26/2020   Procedure: Right 3rd toe  partial amputation, bone biopsy right 1st metatarsal;  Surgeon: Evelina Bucy, DPM;  Location: [facility];  Service: Podiatry;  Laterality: Right;   BUBBLE STUDY  02/11/2020   Procedure: BUBBLE STUDY;  Surgeon: Geralynn Rile, MD;  Location: [place];  Service: Cardiovascular;;   CARDIOVERSION N/A 02/11/2020   Procedure: CARDIOVERSION;  Surgeon: Geralynn Rile, MD;  Location: [place];  Service: Cardiovascular;  Laterality: N/A;   CARDIOVERSION N/A 08/31/2021   Procedure: CARDIOVERSION;  Surgeon: Jerline Pain, MD;  Location[facility];  Service: Cardiovascular;  Laterality: N/A;   CYSTOSCOPY  04/11/2018   Procedure: Erlene Quan;  Surgeon: [name], MD;  Location: [facility];  Service: Urology;;  NO SEEDS FOUND IN BLADDER   HERNIA REPAIR  2009   inguinal   INTRAMEDULLARY (IM) NAIL INTERTROCHANTERIC Right 05/05/2020   Procedure: CPT 27245-Cephalomedullary nailing of right intertrochanteric femur fracture;  Surgeon: Shona Needles, MD;  Location: [place];  Service: Orthopedics;  Laterality: Right;   INTRAMEDULLARY (IM) NAIL INTERTROCHANTERIC Right 12/24/2020   Procedure: REVISION FIXATION OF RIGHT INTERTROCHANTERIC FEMUR NONUNION;  Surgeon: Shona Needles, MD;  Location: [place];  Service: Orthopedics;  Laterality: Right;   IRRIGATION AND DEBRIDEMENT FOOT Left 03/26/2020   Procedure: Left foot incision and drainage with removal of all non-viable soft tissue and bone - areas overlying the 2nd and 5th metatarsals.;  Surgeon: Evelina Bucy, DPM;  Location: [facility];  Service: Podiatry;  Laterality: Left;   RADIOACTIVE SEED IMPLANT N/A 04/11/2018   Procedure: RADIOACTIVE SEED IMPLANT/BRACHYTHERAPY IMPLANT;  Surgeon: [name], MD;  Location[facility];  Service: Urology;  Laterality: N/A;   67     SEEDS IMPLANTED   RIGHT/LEFT HEART CATH AND CORONARY ANGIOGRAPHY N/A 02/09/2020   Procedure: RIGHT/LEFT HEART CATH AND  CORONARY ANGIOGRAPHY;  Surgeon: Nelva Bush, MD;  Location: [place] [facility];  Service: Cardiovascular;  Laterality: N/A;   SPACE OAR INSTILLATION N/A 04/11/2018   Procedure: SPACE OAR INSTILLATION;  Surgeon: [name], MD;  Location[facility];  Service: Urology;  Laterality: N/A;   TEE WITHOUT CARDIOVERSION N/A 02/11/2020   Procedure: TRANSESOPHAGEAL ECHOCARDIOGRAM (TEE);  Surgeon: Geralynn Rile, MD;  Location: [place];  Service: Cardiovascular;  Laterality: N/A;   TOTAL KNEE ARTHROPLASTY Bilateral 09/13/2015   Procedure: BILATERAL KNEE ARTHROPLASTY ;  Surgeon: Paralee Cancel, MD;  Location: [facility];  Service: Orthopedics;  Laterality: Bilateral;    Current Medications: Current Meds  Medication Sig   acetaminophen (TYLENOL) 650 MG CR tablet Take 1,300 mg by mouth 2 (two) times daily as needed for pain.   amiodarone (PACERONE) 100 MG tablet Take 1 tablet (100 mg total) by mouth daily.   apixaban (ELIQUIS) 5 MG TABS tablet Take 1 tablet (5 mg total) by mouth 2 (two) times daily.   doxycycline (VIBRA-TABS) 100 MG tablet Take 1 tablet (100 mg total) by mouth 2 (two) times daily.   DULoxetine (CYMBALTA) 60 MG capsule TAKE 1 CAPSULE BY MOUTH EVERY DAY -   folic acid (FOLVITE) 1 MG tablet Take 1 mg by mouth daily.   lisinopril (ZESTRIL) 10 MG tablet Take 1 tablet (10 mg total)  by mouth daily. (Patient taking differently: Take 10 mg by mouth every evening.)   methotrexate (RHEUMATREX) 2.5 MG tablet Take 15 mg by mouth every Wednesday. Caution:Chemotherapy. Protect from light.   metoprolol succinate (TOPROL XL) 25 MG 24 hr tablet Take 1 tablet (25 mg total) by mouth daily.   NARCAN 4 MG/0.1ML LIQD nasal spray kit Place 1 spray into the nose as needed (as directed for emergency).   oxycodone (ROXICODONE) 30 MG immediate release tablet Take 1 tablet (30 mg total) by mouth every 4 (four) hours as needed fo pain (Patient taking differently: Take 30 mg by mouth  every 4 (four) hours as needed for pain.)   predniSONE (DELTASONE) 5 MG tablet Take 5 mg by mouth daily.   testosterone cypionate (DEPOTESTOSTERONE CYPIONATE) 200 MG/ML injection Inject 100 mg into the muscle every Friday.   [DISCONTINUED] amiodarone (PACERONE) 200 MG tablet Take 200 mg by mouth daily.     Allergies:   Cefadroxil, Diltiazem hcl, Chlorthalidone, and Voltaren [diclofenac]   Social History   Socioeconomic History   Marital status: Married    Spouse name: Not on file   Number of children: Not on file   Years of education: Not on file   Highest education level: Not on file  Occupational History   Not on file  Tobacco Use   Smoking status: Never   Smokeless tobacco: Never  Vaping Use   Vaping Use: Never used  Substance and Sexual Activity   Alcohol use: Yes    Comment: 2 beers a week (occasionally)   Drug use: Not Currently   Sexual activity: Yes  Other Topics Concern   Not on file  Social History Narrative   Not on file   Social Determinants of Health   Financial Resource Strain: Not on file  Food Insecurity: Not on file  Transportation Needs: Not on file  Physical Activity: Not on file  Stress: Not on file  Social Connections: Not on file     Family History: The patient's family history includes Prostate cancer in his brother and father. There is no history of Colon cancer, Rectal cancer, or Stomach cancer.  ROS:   Please see the history of present illness.    Review of Systems  Constitutional:  Negative for chills and fever.  HENT:  Negative for nosebleeds.   Eyes:  Negative for blurred vision.  Cardiovascular:  Negative for chest pain, palpitations, orthopnea, claudication and PND.  Gastrointestinal:  Negative for blood in stool and melena.  Genitourinary:  Negative for hematuria.  Musculoskeletal:  Positive for falls.  Neurological:  Negative for dizziness and loss of consciousness.  Endo/Heme/Allergies:  Negative for polydipsia.   Psychiatric/Behavioral:  Negative for memory loss.     EKGs/Labs/Other Studies Reviewed:    The following studies were reviewed today:  TTE 10/2021: IMPRESSIONS     1. Limited echo for LV function   2. Left ventricular ejection fraction, by estimation, is 55 to 60%. Left  ventricular ejection fraction by 3D volume is 56 %. The left ventricle has  normal function. The left ventricle has no regional wall motion  abnormalities. There is mild left  ventricular hypertrophy. Left ventricular diastolic parameters are  consistent with Grade I diastolic dysfunction (impaired relaxation).   3. The aortic valve is tricuspid. Aortic valve regurgitation is not  visualized.   4. Aortic dilatation noted. There is mild dilatation of the aortic root,  measuring 41 mm.   Comparison(s): Changes from prior study are noted. 08/18/2021:  LVEF 30-35%.   TTE 08/18/21: IMPRESSIONS     1. Left ventricular ejection fraction, by estimation, is 30 to 35%. The  left ventricle has moderately decreased function. The left ventricle  demonstrates global hypokinesis. Left ventricular diastolic parameters are  indeterminate.   2. Right ventricular systolic function is mildly reduced. The right  ventricular size is normal. Tricuspid regurgitation signal is inadequate  for assessing PA pressure.   3. Left atrial size was moderately dilated.   4. Right atrial size was mildly dilated.   5. Trivial mitral valve regurgitation. No evidence of mitral stenosis.   6. The aortic valve was not well visualized. Aortic valve regurgitation  is not visualized. No aortic stenosis is present.   7. The inferior vena cava is normal in size with greater than 50%  respiratory variability, suggesting right atrial pressure of 3 mmHg.   8. The patient was in atrial flutter.   Bilateral LE Arterial Doppler 06/03/2021: Summary:  Right: 30-49% stenosis noted in the anterior tibial artery. 30-49%  stenosis noted in the posterior  tibial artery.   Left: Cystic area with peripheral vascularization proximal to medial  malleolus (area of distal PTA) etiology unknown.   Echo 08/27/2020: IMPRESSIONS    1. Left ventricular ejection fraction, by estimation, is 55 to 60%. The  left ventricle has normal function. The left ventricle has no regional  wall motion abnormalities. There is mild left ventricular hypertrophy.  Left ventricular diastolic parameters  are consistent with Grade I diastolic dysfunction (impaired relaxation).   2. Right ventricular systolic function is normal. The right ventricular  size is normal. There is normal pulmonary artery systolic pressure.   3. Right atrial size was moderately dilated.   4. The mitral valve is grossly normal. Trivial mitral valve  regurgitation.   5. The aortic valve is tricuspid. Aortic valve regurgitation is trivial.  Mild aortic valve sclerosis is present, with no evidence of aortic valve  stenosis.   6. Aortic dilatation noted. There is mild dilatation of the aortic root,  measuring 41 mm.   7. The inferior vena cava is normal in size with greater than 50%  respiratory variability, suggesting right atrial pressure of 3 mmHg.  Echocardiogram 05/06/20: IMPRESSIONS   1. Left ventricular ejection fraction, by estimation, is 45 to 50%. The  left ventricle has mildly decreased function. The left ventricle  demonstrates global hypokinesis. Left ventricular diastolic parameters are  consistent with Grade I diastolic  dysfunction (impaired relaxation).   2. Right ventricular systolic function is normal. The right ventricular  size is normal. There is normal pulmonary artery systolic pressure. The  estimated right ventricular systolic pressure is 05.3 mmHg.   3. The mitral valve is normal in structure. No evidence of mitral valve  regurgitation.   4. The aortic valve was not well visualized. Aortic valve regurgitation  is not visualized. No aortic stenosis is present.   5.  The inferior vena cava is dilated in size with >50% respiratory  variability, suggesting right atrial pressure of 8 mmHg.   Bilateral lower extremity Doppler 03/25/20: Summary:  Right: Resting right ankle-brachial index indicates noncompressible right  lower extremity arteries.   Unable to obtain TBI due to great toe amputation.  Left: Resting left ankle-brachial index indicates noncompressible left  lower extremity arteries. The left toe-brachial index is abnormal.     Right/Left Cardiac Catheterization 02/09/2020: Conclusions: No angiographically significant coronary artery disease. Upper normal left heart, right heart, and pulmonary artery pressures. Mildly reduced Fick  cardiac output/index.   Recommendations: Restart heparin infusion 2 hours after TR band removal.  If there is no evidence of bleeding/vascular complication, consider transitioning back to apixaban as soon as tomorrow. Proceed with TEE/cardioversion as soon as tomorrow.  I will make Mr. Lisenby NPO after midnight in anticipation of this. Optimize evidence-based heart failure therapy for non-ischemic cardiomyopathy. Primary prevention of coronary artery disease.   Diagnostic Dominance: Right       Echocardiogram 02/02/2020: Impression: 1. While there is no visualized LV thrombus on sweeps of the apex,  patient did not have IV access, unable to use echo contrast for  confirmation. Left ventricular ejection fraction, by estimation, is <20%.  The left ventricle has severely decreased  function. The left ventricle demonstrates global hypokinesis. The left  ventricular internal cavity size was moderately dilated. Left ventricular  diastolic function could not be evaluated.   2. Right ventricular systolic function is normal. The right ventricular  size is mildly enlarged. There is mildly elevated pulmonary artery  systolic pressure.   3. Left atrial size was severely dilated.   4. Right atrial size was severely  dilated.   5. Moderate pleural effusion in both left and right lateral regions.   6. The mitral valve is normal in structure. Mild mitral valve  regurgitation.   7. The aortic valve is tricuspid. There is mild calcification of the  aortic valve. Aortic valve regurgitation is not visualized. Mild aortic  valve sclerosis is present, with no evidence of aortic valve stenosis.   8. The inferior vena cava is dilated in size with <50% respiratory  variability, suggesting right atrial pressure of 15 mmHg.   Conclusion(s)/Recommendation(s): Patient in atrial flutter with RVR during  study. Discussed severely reduced EF with Dr. Gasper Sells, cardiologist  on call.  _______________  Bilateral Lower extremity 08/12/19: Summary:  Right: Resting right ankle-brachial index is within normal range.  No evidence of significant right lower extremity arterial disease. The  right toe-brachial index is normal.   Left: Resting left ankle-brachial index is within normal range.  No evidence of significant left lower extremity arterial disease. The left  toe-brachial index is normal.   EKG: EKG is personally reviewed. 03/31/22: EKG was not ordered. 10/04/21: NSR (Dr. Lovena Le) 08/02/2021: Aflutter. Rate 89 bpm.  05/22/2021: (ED)  probable atrial flutter Anteroseptal infarct, age indeterminate ST elevation, consider inferior injury Prolonged QT interval similar to prior   Recent Labs: 05/22/2021: Magnesium 1.8 06/21/2021: ALT 12 03/10/2022: BUN 10; Creatinine, Ser 0.75; Hemoglobin 11.2; Platelets 370; Potassium 3.8; Sodium 142   Recent Lipid Panel    Component Value Date/Time   CHOL 111 02/04/2020 0452   TRIG 72 02/04/2020 0452   HDL 32 (L) 02/04/2020 0452   CHOLHDL 3.5 02/04/2020 0452   VLDL 14 02/04/2020 0452   LDLCALC 65 02/04/2020 0452   LDLDIRECT 94 12/06/2010 0917     Risk Assessment/Calculations:    CHA2DS2-VASc Score = 4  }This indicates a 4.8% annual risk of stroke. The patient's score  is based upon: CHF History: 1 HTN History: 1 Diabetes History: 0 Stroke History: 0 Vascular Disease History: 1 Age Score: 1 Gender Score: 0    Physical Exam:    VS:  BP 128/78   Pulse 91   Ht 6' 8" (2.032 m)   Wt 212 lb (96.2 kg)   SpO2 98%   BMI 23.29 kg/m     Wt Readings from Last 3 Encounters:  03/31/22 212 lb (96.2 kg)  03/10/22 220 lb (  99.8 kg)  02/10/22 228 lb (103.4 kg)     GEN:  Well nourished, well developed in no acute distress HEENT: Normal NECK: No JVD; No carotid bruits CARDIAC: RRR, no murmurs, rubs, gallops RESPIRATORY:  Clear to auscultation without rales, wheezing or rhonchi  ABDOMEN: Soft, non-tender, non-distended MUSCULOSKELETAL:  Trace RLE edema; right foot with orthopedic boot in place SKIN: Warm and dry NEUROLOGIC:  Alert and oriented x 3 PSYCHIATRIC:  Normal affect   ASSESSMENT:    1. Atypical atrial flutter ([place])   2. Medication management   3. High risk medication use   4. NICM (nonischemic cardiomyopathy) ([place])   5. Essential hypertension   6. Hyperlipidemia, unspecified hyperlipidemia type   7. Secondary hypercoagulable state ([place])   8. Chronic combined systolic and diastolic CHF, NYHA class 2 (HCC)    PLAN:    In order of problems listed above:  #Non-ischemic cardiomyopathy: #Chronic systolic heart failure with improved EF 20-->55-60% TTE with LVEF <20% in the setting of Aflutter with RVR with suspected tachycardiac induced CM. Clean coronaries on cath.  Repeat TTE on 08/2020 and 10/2021 with LVEF 55-60, G1DD. Patient currently appears compensated on exam with no HF symptoms. -Off torsemide -Continue metop 32m XL daily -Continue lisionpril 164mdaily -Low Na diet  #Aflutter/Flib: #Secondary Hypercoaguable State: CHADs-vasc 3. S/p TEE with successful DCCV on 1078/93/81omplicated by hypotension which resolved by the next day. Developed recurrent Aflutter in 07/2021 with successful DCCV in 08/2021. Saw Dr. TaLovena Lend is  contemplating ablation. -Does not wish to pursue ablation at this time given all his orthopedic issues -Decrease amiodarone to 10057maily; will have him follow-up in 3 months to see if we can wean off -Continue metop 32m20m daily -Continue apixaban 5mg 77m -Check CMET/TSH next month (can be done at ortho office)  #HLD: -Off lipitor and does not wish to restart -Clean coronaries -Management per PCP   #Chronic Pain: -Follow-up with pain management clinic   #Right Intertrochanteric Femur Fracture: #Right foot osteomyelitis: S/p cephalomedullary nailing with Dr. HaddiDoreatha Martin1/12/22. Follows with Dr. Duda Sharol Giventhe right foot. -Follow-up with ortho as scheduled     Follow-up: 3 months  Medication Adjustments/Labs and Tests Ordered: Current medicines are reviewed at length with the patient today.  Concerns regarding medicines are outlined above.  Orders Placed This Encounter  Procedures   Comp Met (CMET)   TSH   Meds ordered this encounter  Medications   amiodarone (PACERONE) 100 MG tablet    Sig: Take 1 tablet (100 mg total) by mouth daily.    Dispense:  90 tablet    Refill:  1    DOSE DECREASED    Patient Instructions  Medication Instructions:   DECREASE YOUR AMIODARONE TO 100 MG BY MOUTH DAILY  *If you need a refill on your cardiac medications before your next appointment, please call your pharmacy*   Lab Work:  IN [place] HERE IN THE OFFICE--CMET AND TSH LEVEL--IF DR. DUDA AGREES TO CHECK THIS AT YOUR VISIT WITH HIM IN [place] AND YOU CAN LET US [place] THIS SO WE CAN SEE THE RESULTS  If you have labs (blood work) drawn today and your tests are completely normal, you will receive your results only by: MyChaTavernieryou have MyChart) OR A paper copy in the mail If you have any lab test that is abnormal or we need to change your treatment, we will call you to review the results.   Follow-Up:  3 MONTHS WITH AN EXTENDER IN THE OFFICE--SCHEDULE WITH  ERNEST DICK NP OR Christen Bame NP   Important Information About Sugar          I,[facility] as a scribe for Freada Bergeron, MD.,have documented all relevant documentation on the behalf of Freada Bergeron, MD,as directed by  Freada Bergeron, MD while in the presence of Freada Bergeron, MD.  I, Freada Bergeron, MD, have reviewed all documentation for this visit. The documentation on 03/31/22 for the exam, diagnosis, procedures, and orders are all accurate and complete.    Signed, Freada Bergeron, MD  03/31/2022 10:27 AM    [place]

## 2022-04-06 ENCOUNTER — Ambulatory Visit: Payer: Medicare Other | Admitting: Internal Medicine

## 2022-04-10 ENCOUNTER — Ambulatory Visit: Payer: Medicare Other | Admitting: Orthopedic Surgery

## 2022-04-10 ENCOUNTER — Encounter: Payer: Self-pay | Admitting: Orthopedic Surgery

## 2022-04-10 DIAGNOSIS — Z89431 Acquired absence of right foot: Secondary | ICD-10-CM

## 2022-04-10 DIAGNOSIS — T8781 Dehiscence of amputation stump: Secondary | ICD-10-CM

## 2022-04-10 NOTE — Progress Notes (Signed)
Office Visit Note   Patient: Thomas [name]           Date of Birth: December 17, 1950           MRN: [id number] Visit Date: 04/10/2022              Requested by: [name], MD [address] [place],  [place] [address] PCP: [name], MD  Chief Complaint  Patient presents with   Right Foot - Routine Post Op    03/10/2022 right foot Chopart amputation       HPI: Patient is a 71 year old gentleman who is 4 weeks status post right foot revision transmetatarsal amputation.  Patient states he has had recent bleeding from the wound.  Assessment & Plan: Visit Diagnoses:  1. Dehiscence of amputation stump of right lower extremity (HCC)   2. History of transmetatarsal amputation of right foot ([place])     Plan: Patient has progressive swelling and wound dehiscence.  He will continue with his doxycycline he is on Eliquis and prednisone.  Recommended elevation and compression Dial soap cleansing and minimize weightbearing.  Follow-Up Instructions: Return in about 2 weeks (around 04/24/2022).   Ortho Exam  Patient is alert, oriented, no adenopathy, well-dressed, normal affect, normal respiratory effort. Examination the patient has a palpable dorsalis pedis pulse.  He has increased swelling in the right leg and right foot.  There is increased dehiscence of the wound the wound was debrided there is good bleeding tissue after debridement.  Imaging: No results found.   Labs: Lab Results  Component Value Date   HGBA1C 6.2 (H) 02/03/2020   HGBA1C 5.3 10/28/2015   HGBA1C 5.6 08/10/2014   ESRSEDRATE 14 06/21/2021   ESRSEDRATE 17 05/18/2021   ESRSEDRATE 20 (H) 03/28/2020   CRP 19.2 (H) 06/21/2021   CRP 11.1 (H) 05/18/2021   CRP 2.7 (H) 03/28/2020   REPTSTATUS 03/31/2020 FINAL 03/26/2020   GRAMSTAIN NO WBC SEEN NO ORGANISMS SEEN  03/26/2020   CULT  03/26/2020    No growth aerobically or anaerobically. Performed at [facility], [place] [address]  [place], [place] [address]    LABORGA STAPHYLOCOCCUS AUREUS 03/26/2020     Lab Results  Component Value Date   ALBUMIN 3.0 (L) 03/26/2020   ALBUMIN 3.6 03/25/2020   ALBUMIN 4.1 03/24/2020    Lab Results  Component Value Date   MG 1.8 05/22/2021   MG 2.0 02/12/2020   MG 1.7 02/11/2020   Lab Results  Component Value Date   VD25OH 28.61 (L) 05/05/2020    No results found for: "PREALBUMIN"    Latest Ref Rng & Units 03/10/2022   10:10 AM 02/03/2022    2:24 PM 08/26/2021   10:15 AM  CBC EXTENDED  WBC 4.0 - 10.5 K/uL 5.1  10.6  9.0   RBC 4.22 - 5.81 MIL/uL 4.80  4.84  4.89   Hemoglobin 13.0 - 17.0 g/dL 11.2  11.7  12.1   HCT 39.0 - 52.0 % 37.4  39.6  38.6   Platelets 150 - 400 K/uL 370  311  337      There is no height or weight on file to calculate BMI.  Orders:  No orders of the defined types were placed in this encounter.  No orders of the defined types were placed in this encounter.    Procedures: No procedures performed  Clinical Data: No additional findings.  ROS:  All other systems negative, except as noted in the HPI.  Review of Systems  Objective: Vital Signs: There were no vitals taken for this visit.  Specialty Comments:  No specialty comments available.  PMFS History: Patient Active Problem List   Diagnosis Date Noted   Dehiscence of amputation stump ([place]) 03/10/2022   History of transmetatarsal amputation of right foot ([place]) 02/15/2022   Chronic osteomyelitis of toe, right ([place]) 02/10/2022   Fracture of intertrochanteric section of femur, closed, right, with nonunion, subsequent encounter 12/24/2020   Inguinal hernia 10/18/2020   Chronic combined systolic and diastolic CHF, NYHA class 2 ([place])    Osteoporosis 05/05/2020   Foot infection    NICM (nonischemic cardiomyopathy) ([place]), no significant CAD on cardiac cath   02/12/2020   Right foot pain    Rheumatoid arthritis ([place])    Atrial flutter ([place]) 02/02/2020   Lower extremity edema 12/30/2019    Weight loss, non-intentional 11/24/2019   Chronic right hip pain 06/19/2019   Degenerative tear of acetabular labrum of right hip 05/02/2019   Primary osteoarthritis of right hip 05/02/2019   Skin ulcer ([place]) 01/29/2019   Malignant neoplasm of prostate ([place]) 01/16/2018   Insomnia 10/02/2017   S/P bilateral TKA 09/13/2015   Other bilateral secondary osteoarthritis of knee 07/14/2014   Heme positive stool 05/21/2013   Osteoarthritis, multiple sites 01/13/2011   Hypertension 12/06/2010   CLAUSTROPHOBIA 03/01/2010   Past Medical History:  Diagnosis Date   Anemia    Atrial flutter ([place]) 01/2020   Cancer ([place])    prostate   COVID 2021   mild case   Dysrhythmia    Aflutter   Heart failure with reduced ejection fraction (HCC)    Hypertension    Hypoglycemia    occ   NICM (nonischemic cardiomyopathy) ([place]) 02/12/2020   Prostate cancer ([place]) 2019   Rheumatoid arthritis ([place])     Family History  Problem Relation Age of Onset   Prostate cancer Father    Prostate cancer Brother    Colon cancer Neg Hx    Rectal cancer Neg Hx    Stomach cancer Neg Hx     Past Surgical History:  Procedure Laterality Date   AMPUTATION Right 02/10/2022   Procedure: RIGHT TRANSMETATARSAL AMPUTATION;  Surgeon: Newt Minion, MD;  Location: [facility];  Service: Orthopedics;  Laterality: Right;   AMPUTATION Right 03/10/2022   Procedure: RIGHT FOOT CHOPART AMPUTATION;  Surgeon: Newt Minion, MD;  Location: [place] [facility];  Service: Orthopedics;  Laterality: Right;   AMPUTATION TOE Right 03/26/2020   Procedure: Right 3rd toe partial amputation, bone biopsy right 1st metatarsal;  Surgeon: Evelina Bucy, DPM;  [facility];  Service: Podiatry;  Laterality: Right;   BUBBLE STUDY  02/11/2020   Procedure: BUBBLE STUDY;  Surgeon: Geralynn Rile, MD;  Location: [place];  Service: Cardiovascular;;   CARDIOVERSION N/A 02/11/2020   Procedure: CARDIOVERSION;  Surgeon: Geralynn Rile, MD;  Location:  [place];  Service: Cardiovascular;  Laterality: N/A;   CARDIOVERSION N/A 08/31/2021   Procedure: CARDIOVERSION;  Surgeon: Jerline Pain, MD;  Location: [facility];  Service: Cardiovascular;  Laterality: N/A;   CYSTOSCOPY  04/11/2018   Procedure: Erlene Quan;  Surgeon: [name], MD;  Location: [facility];  Service: Urology;;  NO SEEDS FOUND IN BLADDER   HERNIA REPAIR  2009   inguinal   INTRAMEDULLARY (IM) NAIL INTERTROCHANTERIC Right 05/05/2020   Procedure: CPT 27245-Cephalomedullary nailing of right intertrochanteric femur fracture;  Surgeon: Shona Needles, MD;  Location: [facility];  Service: Orthopedics;  Laterality: Right;   INTRAMEDULLARY (IM) NAIL INTERTROCHANTERIC Right 12/24/2020   Procedure: REVISION FIXATION OF RIGHT INTERTROCHANTERIC FEMUR NONUNION;  Surgeon: Shona Needles, MD;  Location: [facility];  Service: Orthopedics;  Laterality: Right;   IRRIGATION AND DEBRIDEMENT FOOT Left 03/26/2020   Procedure: Left foot incision and drainage with removal of all non-viable soft tissue and bone - areas overlying the 2nd and 5th metatarsals.;  Surgeon: Evelina Bucy, DPM;  [facility];  Service: Podiatry;  Laterality: Left;   RADIOACTIVE SEED IMPLANT N/A 04/11/2018   Procedure: RADIOACTIVE SEED IMPLANT/BRACHYTHERAPY IMPLANT;  Surgeon: [name], MD;  Location: [facility];  Service: Urology;  Laterality: N/A;   50     SEEDS IMPLANTED   RIGHT/LEFT HEART CATH AND CORONARY ANGIOGRAPHY N/A 02/09/2020   Procedure: RIGHT/LEFT HEART CATH AND CORONARY ANGIOGRAPHY;  Surgeon: Nelva Bush, MD;  Location: [facility];  Service: Cardiovascular;  Laterality: N/A;   SPACE OAR INSTILLATION N/A 04/11/2018   Procedure: SPACE OAR INSTILLATION;  Surgeon: [name], MD;  Location: [facility];  Service: Urology;  Laterality: N/A;   TEE WITHOUT CARDIOVERSION N/A 02/11/2020   Procedure: TRANSESOPHAGEAL  ECHOCARDIOGRAM (TEE);  Surgeon: Geralynn Rile, MD;  Location: [place];  Service: Cardiovascular;  Laterality: N/A;   TOTAL KNEE ARTHROPLASTY Bilateral 09/13/2015   Procedure: BILATERAL KNEE ARTHROPLASTY ;  Surgeon: Paralee Cancel, MD;  [facility];  Service: Orthopedics;  Laterality: Bilateral;   Social History   Occupational History   Not on file  Tobacco Use   Smoking status: Never   Smokeless tobacco: Never  Vaping Use   Vaping Use: Never used  Substance and Sexual Activity   Alcohol use: Yes    Comment: 2 beers a week (occasionally)   Drug use: Not Currently   Sexual activity: Yes

## 2022-04-12 ENCOUNTER — Telehealth: Payer: Self-pay | Admitting: Orthopedic Surgery

## 2022-04-12 ENCOUNTER — Other Ambulatory Visit: Payer: Self-pay | Admitting: Orthopedic Surgery

## 2022-04-12 MED ORDER — SULFAMETHOXAZOLE-TRIMETHOPRIM 800-160 MG PO TABS: 1.0000 | ORAL_TABLET | Freq: Two times a day (BID) | ORAL | 0 refills | Status: DC

## 2022-04-12 NOTE — Telephone Encounter (Signed)
I called pt and he states that before he was given an ABX but could not take it bc he was on methotrexate. He states that his rheumatologist has put that med on hold for now and that if you wanted to put him on ABX that was stronger than doxy he can take that now. Please advise.   Encouragement given about non weight bearing, compression, elevation and dial soap cleansing. The pt is concerned that the wound is not healing he was in the office Monday with dehiscence of incision. After reviewing conservative measures and advising to increase protein intake pt reassured and will await directions on ABX

## 2022-04-13 NOTE — Telephone Encounter (Signed)
I called advised abx sent to pharm and to call with any other questions.

## 2022-04-14 ENCOUNTER — Other Ambulatory Visit: Payer: Self-pay

## 2022-04-14 ENCOUNTER — Telehealth: Payer: Self-pay

## 2022-04-14 MED ORDER — SULFAMETHOXAZOLE-TRIMETHOPRIM 800-160 MG PO TABS: 1.0000 | ORAL_TABLET | Freq: Two times a day (BID) | ORAL | 0 refills | Status: DC

## 2022-04-14 NOTE — Telephone Encounter (Signed)
Pt called and states that the rx for bactrim DS [facility]. I called the pharmacy and lm on vm and also resubmit for fax as per Dr. Jess Barters instruction.

## 2022-04-25 ENCOUNTER — Other Ambulatory Visit: Payer: Self-pay | Admitting: Family

## 2022-04-27 ENCOUNTER — Ambulatory Visit: Payer: Medicare Other | Admitting: Orthopedic Surgery

## 2022-05-05 ENCOUNTER — Ambulatory Visit (INDEPENDENT_AMBULATORY_CARE_PROVIDER_SITE_OTHER): Payer: Medicare Other | Admitting: Family

## 2022-05-05 DIAGNOSIS — T8781 Dehiscence of amputation stump: Secondary | ICD-10-CM

## 2022-05-05 MED ORDER — NITROGLYCERIN 0.2 MG/HR TD PT24: 0.2000 mg | MEDICATED_PATCH | Freq: Every day | TRANSDERMAL | 1 refills | Status: DC

## 2022-05-05 NOTE — Progress Notes (Signed)
Post-Op Visit Note   Patient: Thomas [name]           Date of Birth: 1950-06-22           MRN: [id number] Visit Date: 05/05/2022 PCP: [name], MD  Chief Complaint:  Chief Complaint  Patient presents with   Right Foot - Routine Post Op    03/10/2022 right foot Chopart amputation    HPI:  HPI The patient is a 72 year old gentleman seen status post right foot revision transmetatarsal amputation he is currently completing a course of doxycycline.  He is also on Eliquis and prednisone at baseline.  Feels he has been minimizing is pleased with the improvement in his wound and feels the antibiotic is currently working better Ortho Exam On examination of the right residual limb the area of dehiscence is improved this is about 4 cm in length 15 mm in width there is no depth today there is no proud granulation this is filled in with full granulation and scant bloody drainage no surrounding erythema or warmth no purulence  Visit Diagnoses: No diagnosis found.  Plan: Continue to minimize weightbearing will start nitroglycerin patches.  Daily Dial soap cleansing dry dressings  Follow-Up Instructions: No follow-ups on file.   Imaging: No results found.  Orders:  No orders of the defined types were placed in this encounter.  Meds ordered this encounter  Medications   nitroGLYCERIN (NITRODUR - DOSED IN MG/24 HR) 0.2 mg/hr patch    Sig: Place 1 patch (0.2 mg total) onto the skin daily.    Dispense:  30 patch    Refill:  1    Apply patch near the effected area, change location daily     PMFS History: Patient Active Problem List   Diagnosis Date Noted   Dehiscence of amputation stump ([place]) 03/10/2022   History of transmetatarsal amputation of right foot ([place]) 02/15/2022   Chronic osteomyelitis of toe, right ([place]) 02/10/2022   Fracture of intertrochanteric section of femur, closed, right, with nonunion, subsequent encounter 12/24/2020   Inguinal hernia 10/18/2020    Chronic combined systolic and diastolic CHF, NYHA class 2 ([place])    Osteoporosis 05/05/2020   Foot infection    NICM (nonischemic cardiomyopathy) ([place]), no significant CAD on cardiac cath   02/12/2020   Right foot pain    Rheumatoid arthritis ([place])    Atrial flutter ([place]) 02/02/2020   Lower extremity edema 12/30/2019   Weight loss, non-intentional 11/24/2019   Chronic right hip pain 06/19/2019   Degenerative tear of acetabular labrum of right hip 05/02/2019   Primary osteoarthritis of right hip 05/02/2019   Skin ulcer ([place]) 01/29/2019   Malignant neoplasm of prostate ([place]) 01/16/2018   Insomnia 10/02/2017   S/P bilateral TKA 09/13/2015   Other bilateral secondary osteoarthritis of knee 07/14/2014   Heme positive stool 05/21/2013   Osteoarthritis, multiple sites 01/13/2011   Hypertension 12/06/2010   CLAUSTROPHOBIA 03/01/2010   Past Medical History:  Diagnosis Date   Anemia    Atrial flutter ([place]) 01/2020   Cancer ([place])    prostate   COVID 2021   mild case   Dysrhythmia    Aflutter   Heart failure with reduced ejection fraction (HCC)    Hypertension    Hypoglycemia    occ   NICM (nonischemic cardiomyopathy) ([place]) 02/12/2020   Prostate cancer ([place]) 2019   Rheumatoid arthritis ([place])     Family History  Problem Relation Age of Onset   Prostate cancer Father  Prostate cancer Brother    Colon cancer Neg Hx    Rectal cancer Neg Hx    Stomach cancer Neg Hx     Past Surgical History:  Procedure Laterality Date   AMPUTATION Right 02/10/2022   Procedure: RIGHT TRANSMETATARSAL AMPUTATION;  Surgeon: Newt Minion, MD;  Location: [place];  Service: Orthopedics;  Laterality: Right;   AMPUTATION Right 03/10/2022   Procedure: RIGHT FOOT CHOPART AMPUTATION;  Surgeon: Newt Minion, MD;  Location: [facility];  Service: Orthopedics;  Laterality: Right;   AMPUTATION TOE Right 03/26/2020   Procedure: Right 3rd toe partial amputation, bone biopsy right 1st metatarsal;  Surgeon: Evelina Bucy, DPM;  [facility];  Service: Podiatry;  Laterality: Right;   BUBBLE STUDY  02/11/2020   Procedure: BUBBLE STUDY;  Surgeon: Geralynn Rile, MD;  Location: [facility];  Service: Cardiovascular;;   CARDIOVERSION N/A 02/11/2020   Procedure: CARDIOVERSION;  Surgeon: Geralynn Rile, MD;  Location: [facility];  Service: Cardiovascular;  Laterality: N/A;   CARDIOVERSION N/A 08/31/2021   Procedure: CARDIOVERSION;  Surgeon: Jerline Pain, MD;  Location: [facility];  Service: Cardiovascular;  Laterality: N/A;   CYSTOSCOPY  04/11/2018   Procedure: Erlene Quan;  Surgeon: [name], MD;  Location: [facility];  Service: Urology;;  NO SEEDS FOUND IN BLADDER   HERNIA REPAIR  2009   inguinal   INTRAMEDULLARY (IM) NAIL INTERTROCHANTERIC Right 05/05/2020   Procedure: CPT 27245-Cephalomedullary nailing of right intertrochanteric femur fracture;  Surgeon: Shona Needles, MD;  Location: [place];  Service: Orthopedics;  Laterality: Right;   INTRAMEDULLARY (IM) NAIL INTERTROCHANTERIC Right 12/24/2020   Procedure: REVISION FIXATION OF RIGHT INTERTROCHANTERIC FEMUR NONUNION;  Surgeon: Shona Needles, MD;  Location: [place];  Service: Orthopedics;  Laterality: Right;   IRRIGATION AND DEBRIDEMENT FOOT Left 03/26/2020   Procedure: Left foot incision and drainage with removal of all non-viable soft tissue and bone - areas overlying the 2nd and 5th metatarsals.;  Surgeon: Evelina Bucy, DPM;  [facility];  Service: Podiatry;  Laterality: Left;   RADIOACTIVE SEED IMPLANT N/A 04/11/2018   Procedure: RADIOACTIVE SEED IMPLANT/BRACHYTHERAPY IMPLANT;  Surgeon: [name], MD;  Location: [facility];  Service: Urology;  Laterality: N/A;   16     SEEDS IMPLANTED   RIGHT/LEFT HEART CATH AND CORONARY ANGIOGRAPHY N/A 02/09/2020   Procedure: RIGHT/LEFT HEART CATH AND CORONARY ANGIOGRAPHY;  Surgeon: Nelva Bush, MD;  Location: [place] [facility];  Service: Cardiovascular;  Laterality: N/A;   SPACE OAR INSTILLATION N/A 04/11/2018   Procedure: SPACE OAR INSTILLATION;  Surgeon: [name], MD;  Location: [facility];  Service: Urology;  Laterality: N/A;   TEE WITHOUT CARDIOVERSION N/A 02/11/2020   Procedure: TRANSESOPHAGEAL ECHOCARDIOGRAM (TEE);  Surgeon: Geralynn Rile, MD;  Location: [place];  Service: Cardiovascular;  Laterality: N/A;   TOTAL KNEE ARTHROPLASTY Bilateral 09/13/2015   Procedure: BILATERAL KNEE ARTHROPLASTY ;  Surgeon: Paralee Cancel, MD;  [facility];  Service: Orthopedics;  Laterality: Bilateral;   Social History   Occupational History   Not on file  Tobacco Use   Smoking status: Never   Smokeless tobacco: Never  Vaping Use   Vaping Use: Never used  Substance and Sexual Activity   Alcohol use: Yes    Comment: 2 beers a week (occasionally)   Drug use: Not Currently   Sexual activity: Yes

## 2022-05-12 ENCOUNTER — Encounter: Payer: Self-pay | Admitting: Family

## 2022-05-18 ENCOUNTER — Telehealth: Payer: Self-pay | Admitting: Cardiology

## 2022-05-18 DIAGNOSIS — I4892 Unspecified atrial flutter: Secondary | ICD-10-CM

## 2022-05-18 MED ORDER — APIXABAN 5 MG PO TABS: 5.0000 mg | ORAL_TABLET | Freq: Two times a day (BID) | ORAL | 1 refills | Status: DC

## 2022-05-18 NOTE — Telephone Encounter (Signed)
*  STAT* If patient is at the pharmacy, call can be transferred to refill team.   1. Which medications need to be refilled? (please list name of each medication and dose if known) apixaban (ELIQUIS) 5 MG TABS tablet   2. Which pharmacy/location (including street and city if local pharmacy) is medication to be sent to? [id number] [place] [place] [facility] [address]   3. Do they need a 30 day or 90 day supply? [address]

## 2022-05-18 NOTE — Telephone Encounter (Signed)
Prescription refill request for Eliquis received. Indication: Aflutter  Last office visit: 03/31/22 Thomas [name])  Scr: 0.75 (03/10/22)  Age: 72 Weight: 96.2kg  Appropriate dose and refill sent to requested pharmacy.

## 2022-05-19 ENCOUNTER — Telehealth: Payer: Self-pay

## 2022-05-19 NOTE — Telephone Encounter (Signed)
Patient called concerning a Rx that he was prescribed by Dr. Sharol Given.  Would like a call back to schedule.  CB# [phone].  Please advise.  Thank you.

## 2022-05-19 NOTE — Telephone Encounter (Signed)
Pt called and states that he picked up a medication from the pharmacy and he does not know what it is. The pt states that its Tamsulosin and I advised the pt that its Flomax and he should call his urologist and see if this is something that they called in for him. Pt voiced understanding and will call with any other questions.

## 2022-05-29 ENCOUNTER — Telehealth: Payer: Self-pay | Admitting: Family Medicine

## 2022-05-29 NOTE — Telephone Encounter (Signed)
Left message for patient to call back and schedule Medicare Annual Wellness Visit (AWV).   Please offer to do virtually or by telephone.  Left office number and my jabber [phone].  AWVI eligible as of 10/23/2018  Please schedule at anytime with Nurse Health Advisor.

## 2022-06-01 ENCOUNTER — Other Ambulatory Visit: Payer: Self-pay | Admitting: Orthopedic Surgery

## 2022-06-01 ENCOUNTER — Encounter (HOSPITAL_COMMUNITY): Payer: Self-pay | Admitting: *Deleted

## 2022-06-02 ENCOUNTER — Encounter: Payer: Self-pay | Admitting: Family

## 2022-06-02 ENCOUNTER — Ambulatory Visit (INDEPENDENT_AMBULATORY_CARE_PROVIDER_SITE_OTHER): Payer: BLUE CROSS/BLUE SHIELD | Admitting: Family

## 2022-06-02 DIAGNOSIS — Z89431 Acquired absence of right foot: Secondary | ICD-10-CM

## 2022-06-02 NOTE — Progress Notes (Signed)
Post-Op Visit Note   Patient: Thomas [name]           Date of Birth: 10/09/1950           MRN: [id number] Visit Date: 06/02/2022 PCP: [name], MD  Chief Complaint: No chief complaint on file.   HPI:  HPI The patient is a 72 year old gentleman who is seen status post chopart amputation on the right which has been slow to heal.  He has been using nitroglycerin patches as well as daily Dial soap cleansing dry dressings.  Wonders if taking off the dressing to let the wound get air will be helpful.  Continues in a postop shoe. Ortho Exam On examination of the right foot laterally the incision is well-healed over the medial border there is a open area which is now 2 cm x 45m in no proud tissue no drainage there is is another area about 1 cm in diameter that is open with flat pink tissue no surrounding erythema no probing  Visit Diagnoses: No diagnosis found.  Plan: His incision continues to heal slowly.  Given Silvadene to begin daily Silvadene dressing changes discussed proper use thin layer of Silvadene  Follow-Up Instructions: No follow-ups on file.   Imaging: No results found.  Orders:  No orders of the defined types were placed in this encounter.  No orders of the defined types were placed in this encounter.    PMFS History: Patient Active Problem List   Diagnosis Date Noted   Dehiscence of amputation stump ([facility] 03/10/2022   History of transmetatarsal amputation of right foot ([facility] [place] 02/15/2022   Chronic osteomyelitis of toe, right ([facility] 02/10/2022   Fracture of intertrochanteric section of femur, closed, right, with nonunion, subsequent encounter 12/24/2020   Inguinal hernia 10/18/2020   Chronic combined systolic and diastolic CHF, NYHA class 2 ([facility]    Osteoporosis 05/05/2020   Foot infection    NICM (nonischemic cardiomyopathy) ([facility], no significant CAD on cardiac cath   02/12/2020   Right foot pain    Rheumatoid arthritis ([facility]    Atrial flutter  ([facility] 02/02/2020   Lower extremity edema 12/30/2019   Weight loss, non-intentional 11/24/2019   Chronic right hip pain 06/19/2019   Degenerative tear of acetabular labrum of right hip 05/02/2019   Primary osteoarthritis of right hip 05/02/2019   Skin ulcer ([facility] 01/29/2019   Malignant neoplasm of prostate ([facility] 01/16/2018   Insomnia 10/02/2017   S/P bilateral TKA 09/13/2015   Other bilateral secondary osteoarthritis of knee 07/14/2014   Heme positive stool 05/21/2013   Osteoarthritis, multiple sites 01/13/2011   Hypertension 12/06/2010   CLAUSTROPHOBIA 03/01/2010   Past Medical History:  Diagnosis Date   Anemia    Atrial flutter ([facility] 01/2020   Cancer ([facility]    prostate   COVID 2021   mild case   Dysrhythmia    Aflutter   Heart failure with reduced ejection fraction (HCC)    Hypertension    Hypoglycemia    occ   NICM (nonischemic cardiomyopathy) ([facility] 02/12/2020   Prostate cancer ([facility] 2019   Rheumatoid arthritis ([facility]     Family History  Problem Relation Age of Onset   Prostate cancer Father    Prostate cancer Brother    Colon cancer Neg Hx    Rectal cancer Neg Hx    Stomach cancer Neg Hx     Past Surgical History:  Procedure Laterality Date   AMPUTATION Right 02/10/2022   Procedure: RIGHT TRANSMETATARSAL AMPUTATION;  Surgeon:  Newt Minion, MD;  Location: [facility];  Service: Orthopedics;  Laterality: Right;   AMPUTATION Right 03/10/2022   Procedure: RIGHT FOOT CHOPART AMPUTATION;  Surgeon: Newt Minion, MD;  Location: [facility];  Service: Orthopedics;  Laterality: Right;   AMPUTATION TOE Right 03/26/2020   Procedure: Right 3rd toe partial amputation, bone biopsy right 1st metatarsal;  Surgeon: Evelina Bucy, DPM;  [facility];  Service: Podiatry;  Laterality: Right;   BUBBLE STUDY  02/11/2020   Procedure: BUBBLE STUDY;  Surgeon: Geralynn Rile, MD;  Location: [facility];  Service: Cardiovascular;;   CARDIOVERSION N/A 02/11/2020   Procedure:  CARDIOVERSION;  Surgeon: Geralynn Rile, MD;  Location: [facility];  Service: Cardiovascular;  Laterality: N/A;   CARDIOVERSION N/A 08/31/2021   Procedure: CARDIOVERSION;  Surgeon: Jerline Pain, MD;  Location[facility];  Service: Cardiovascular;  Laterality: N/A;   CYSTOSCOPY  04/11/2018   Procedure: Erlene Quan;  Surgeon: [name], MD;  Location: [facility];  Service: Urology;;  NO SEEDS FOUND IN BLADDER   HERNIA REPAIR  2009   inguinal   INTRAMEDULLARY (IM) NAIL INTERTROCHANTERIC Right 05/05/2020   Procedure: CPT 27245-Cephalomedullary nailing of right intertrochanteric femur fracture;  Surgeon: Shona Needles, MD;  Location: [facility];  Service: Orthopedics;  Laterality: Right;   INTRAMEDULLARY (IM) NAIL INTERTROCHANTERIC Right 12/24/2020   Procedure: REVISION FIXATION OF RIGHT INTERTROCHANTERIC FEMUR NONUNION;  Surgeon: Shona Needles, MD;  Location: [facility];  Service: Orthopedics;  Laterality: Right;   IRRIGATION AND DEBRIDEMENT FOOT Left 03/26/2020   Procedure: Left foot incision and drainage with removal of all non-viable soft tissue and bone - areas overlying the 2nd and 5th metatarsals.;  Surgeon: Evelina Bucy, DPM;  [facility];  Service: Podiatry;  Laterality: Left;   RADIOACTIVE SEED IMPLANT N/A 04/11/2018   Procedure: RADIOACTIVE SEED IMPLANT/BRACHYTHERAPY IMPLANT;  Surgeon: [name], MD;  Location: [facility];  Service: Urology;  Laterality: N/A;   43     SEEDS IMPLANTED   RIGHT/LEFT HEART CATH AND CORONARY ANGIOGRAPHY N/A 02/09/2020   Procedure: RIGHT/LEFT HEART CATH AND CORONARY ANGIOGRAPHY;  Surgeon: Nelva Bush, MD;  Location: [facility];  Service: Cardiovascular;  Laterality: N/A;   SPACE OAR INSTILLATION N/A 04/11/2018   Procedure: SPACE OAR INSTILLATION;  Surgeon: [name], MD;  [facility];  Service: Urology;  Laterality: N/A;   TEE WITHOUT  CARDIOVERSION N/A 02/11/2020   Procedure: TRANSESOPHAGEAL ECHOCARDIOGRAM (TEE);  Surgeon: Geralynn Rile, MD;  Location: [facility];  Service: Cardiovascular;  Laterality: N/A;   TOTAL KNEE ARTHROPLASTY Bilateral 09/13/2015   Procedure: BILATERAL KNEE ARTHROPLASTY ;  Surgeon: Paralee Cancel, MD;  [facility];  Service: Orthopedics;  Laterality: Bilateral;   Social History   Occupational History   Not on file  Tobacco Use   Smoking status: Never   Smokeless tobacco: Never  Vaping Use   Vaping Use: Never used  Substance and Sexual Activity   Alcohol use: Yes    Comment: 2 beers a week (occasionally)   Drug use: Not Currently   Sexual activity: Yes

## 2022-06-11 ENCOUNTER — Other Ambulatory Visit: Payer: Self-pay | Admitting: Orthopedic Surgery

## 2022-06-20 ENCOUNTER — Other Ambulatory Visit: Payer: Self-pay

## 2022-06-20 ENCOUNTER — Ambulatory Visit (INDEPENDENT_AMBULATORY_CARE_PROVIDER_SITE_OTHER): Payer: Medicare Other | Admitting: Family Medicine

## 2022-06-20 VITALS — BP 136/73 | HR 64 | Wt 210.4 lb

## 2022-06-20 DIAGNOSIS — H1132 Conjunctival hemorrhage, left eye: Secondary | ICD-10-CM

## 2022-06-20 DIAGNOSIS — R4589 Other symptoms and signs involving emotional state: Secondary | ICD-10-CM | POA: Insufficient documentation

## 2022-06-20 NOTE — Patient Instructions (Signed)
It was great to meet you!  You have a subconjunctival hemorrhage. This should get better on it's own over the next few weeks.  Subconjunctival Hemorrhage Subconjunctival hemorrhage is bleeding that happens between the white part of your eye (sclera) and the clear membrane that covers the outside of your eye (conjunctiva). There are many tiny blood vessels near the surface of your eye. A subconjunctival hemorrhage happens when one or more of these vessels breaks and bleeds, causing a red patch to appear on your eye. This is similar to a bruise. Depending on the amount of bleeding, the red patch may only cover a small area of your eye or it may cover the entire visible part of the sclera. If a lot of blood collects under the conjunctiva, there may also be swelling. Subconjunctival hemorrhages do not affect your vision or cause pain, but your eye may feel irritated if there is swelling. Subconjunctival hemorrhages usually do not require treatment, and they usually disappear on their own within two to four weeks. What are the causes? This condition may be caused by: Mild trauma, such as rubbing your eye too hard. Blunt injuries, such as from playing sports or coming into contact with a deployed airbag. Coughing, sneezing, or vomiting. Straining, such as when lifting a heavy object. Medical conditions, such as: High blood pressure. Diabetes. Recent eye surgery. Certain medicines, especially blood thinners (anticoagulants), including aspirin. Other conditions, such as eye tumors, bleeding disorders, or blood vessel abnormalities. Subconjunctival hemorrhages can also happen without an obvious cause. What are the signs or symptoms? Symptoms of this condition include: A bright red or dark red patch on the white part of the eye. The red area may: Spread out to cover a larger area of the eye before it goes away. Turn colors such as pink or brownish-yellow before it goes away. Swelling around the  eye. Mild eye irritation. How is this diagnosed? This condition is diagnosed with a physical exam. If your subconjunctival hemorrhage was caused by trauma, your health care provider may refer you to an eye specialist (ophthalmologist) or another specialist to check for other injuries. You may have other tests, including: An eye exam including a vision test, checking your eye with a type of microscope (slit lamp) and measuring the pressure in your eye. Your eye may be dilated, especially if your subconjunctival hemorrhage was caused by trauma. A blood pressure check. Blood tests to check for bleeding disorders. If your subconjunctival hemorrhage was caused by trauma, X-rays or a CT scan may be done to check for other injuries. How is this treated? Usually, treatment is not needed for this condition. If you have discomfort, your health care provider may recommend eye drops or cold compresses. Follow these instructions at home: Take over-the-counter and prescription medicines only as directed by your health care provider. Use eye drops or cold compresses to help with discomfort as directed by your health care provider. Avoid activities, things, and environments that may irritate or injure your eye. Keep all follow-up visits. This is important. Contact a health care provider if: You have pain in your eye. The bleeding does not go away within 4 weeks. You keep getting new subconjunctival hemorrhages. Get help right away if: Your vision changes, you have difficulty seeing, or you develop double vision. You suddenly develop severe sensitivity to light. You develop a severe headache, persistent vomiting, confusion, or abnormal tiredness (lethargy). Your eye seems to bulge or protrude from your eye socket. You develop unexplained bruises on  your body. You have unexplained bleeding in another area of your body. These symptoms may represent a serious problem that is an emergency. Do not wait to see if  the symptoms will go away. Get medical help right away. Call your local emergency services (911 in the [place] Do not drive yourself to the hospital. Summary Subconjunctival hemorrhage is bleeding that happens between the white part of your eye and the clear membrane that covers the outside of your eye. This condition is similar to a bruise. Subconjunctival hemorrhages usually do not require treatment, and they usually disappear on their own within two to four weeks. Use eye drops or cold compresses to help with discomfort as directed by your health care provider. This information is not intended to replace advice given to you by your health care provider. Make sure you discuss any questions you have with your health care provider. Document Revised: 06/16/2020 Document Reviewed: 06/16/2020 Elsevier Patient Education  [place].

## 2022-06-20 NOTE — Assessment & Plan Note (Signed)
Chronic, seems there is a situational component related to his health problems. Passive SI but no intent or plan, able to contract for safety. H/o similar PHQ-9 responses in the past. Continue cymbalta '60mg'$  daily. Patient adamantly denies therapy/counseling

## 2022-06-20 NOTE — Progress Notes (Signed)
    SUBJECTIVE:   CHIEF COMPLAINT / HPI:   Left Eye Redness -was cleaning tree limbs out of his backyard 3 days ago -accidentally poked himself in the left eye with a small branch -didn't think much of it at that time -wife noticed eye redness last night and wanted him to be seen -no eye pain -no vision changes -no headache, dizziness  Depressed Mood Marked positive response to #9 on PHQ. Reports he's had 3 amputations, hip surgery, has a multitude of medical problems and sometimes thinks he would be better off if he were not alive.  Also saw his mom go through a lot in a nursing home and does not want to have to go through that himself.  Denies active SI.  No plan.  No prior attempts.  Is on Cymbalta 60 mg daily.  Absolutely not interested in therapy.  PERTINENT  PMH / PSH: HTN, combined CHF, s/p R TMA, rheumatoid arthritis, a-flutter, prostate cancer  OBJECTIVE:   BP 136/73   Pulse 64   Wt 210 lb 6.4 oz (95.4 kg)   SpO2 100%   BMI 23.11 kg/m   Gen: NAD, pleasant, able to participate in exam HEENT: PERRLA, EOMI, L eye with moderately large subconjunctival hemorrhage of medial aspect of eye Extremities: s/p R TMA Skin: warm and dry, no rashes noted Neuro: alert, no obvious focal deficits Psych: Normal affect, normal speech, fair insight   ASSESSMENT/PLAN:   Subconjunctival Hemorrhage, L Eye Presentation consistent with subconjunctival hemorrhage of L eye s/p blunt trauma. No pain or vision changes. Reassurance provided. Return precautions reviewed.  Depressed mood Chronic, seems there is a situational component related to his health problems. Passive SI but no intent or plan, able to contract for safety. H/o similar PHQ-9 responses in the past. Continue cymbalta 23m daily. Patient adamantly denies therapy/counseling     AAlcus Dad MD [place]

## 2022-06-28 NOTE — Progress Notes (Signed)
Office Visit    Patient Name: SNOWDEN FAFARD Date of Encounter: 06/30/2022  Primary Care Provider:  [name], MD Primary Cardiologist:  Freada Bergeron, MD Primary Electrophysiologist: None  Chief Complaint    LUISDAVID BOUWMAN is a 72 y.o. male with PMH of HFrEF, NICM, atrial flutter s/p DCCV 01/2020 (on Eliquis) HTN, nonobstructive CAD, osteomyelitis s/p multiple toe amputations, prostate CA, anxiety and depression who presents today for 52-monthfollow-up of congestive heart failure.  Past Medical History    Past Medical History:  Diagnosis Date   Anemia    Atrial flutter ([place] 01/2020   Cancer ([place]    prostate   COVID 2021   mild case   Dysrhythmia    Aflutter   Heart failure with reduced ejection fraction (HCC)    Hypertension    Hypoglycemia    occ   NICM (nonischemic cardiomyopathy) [place] 02/12/2020   Prostate cancer ([place] 2019   Rheumatoid arthritis ([place]    Past Surgical History:  Procedure Laterality Date   AMPUTATION Right 02/10/2022   Procedure: RIGHT TRANSMETATARSAL AMPUTATION;  Surgeon: DNewt Minion MD;  [place]  Service: Orthopedics;  Laterality: Right;   AMPUTATION Right 03/10/2022   Procedure: RIGHT FOOT CHOPART AMPUTATION;  Surgeon: DNewt Minion MD;  [place]  Service: Orthopedics;  Laterality: Right;   AMPUTATION TOE Right 03/26/2020   Procedure: Right 3rd toe partial amputation, bone biopsy right 1st metatarsal;  Surgeon: PEvelina Bucy DPM;  [facility];  Service: Podiatry;  Laterality: Right;   BUBBLE STUDY  02/11/2020   Procedure: BUBBLE STUDY;  Surgeon: OGeralynn Rile MD;  [place]  Service: Cardiovascular;;   CARDIOVERSION N/A 02/11/2020   Procedure: CARDIOVERSION;  Surgeon: OGeralynn Rile MD;  [place]  Service: Cardiovascular;  Laterality: N/A;   CARDIOVERSION N/A 08/31/2021   Procedure: CARDIOVERSION;  Surgeon: SJerline Pain MD;  [facility];  Service: Cardiovascular;  Laterality: N/A;   CYSTOSCOPY  04/11/2018   Procedure: CErlene Quan  Surgeon: HArdis Hughs MD;  [facility]  Service: Urology;;  NO SEEDS FOUND IN BLADDER   HERNIA REPAIR  2009   inguinal   INTRAMEDULLARY (IM) NAIL INTERTROCHANTERIC Right 05/05/2020   Procedure: CPT 27245-Cephalomedullary nailing of right intertrochanteric femur fracture;  Surgeon: HShona Needles MD;  [place]  Service: Orthopedics;  Laterality: Right;   INTRAMEDULLARY (IM) NAIL INTERTROCHANTERIC Right 12/24/2020   Procedure: REVISION FIXATION OF RIGHT INTERTROCHANTERIC FEMUR NONUNION;  Surgeon: HShona Needles MD;  [place]  Service: Orthopedics;  Laterality: Right;   IRRIGATION AND DEBRIDEMENT FOOT Left 03/26/2020   Procedure: Left foot incision and drainage with removal of all non-viable soft tissue and bone - areas overlying the 2nd and 5th metatarsals.;  Surgeon: PEvelina Bucy DPM;  [facility];  Service: Podiatry;  Laterality: Left;   RADIOACTIVE SEED IMPLANT N/A 04/11/2018   Procedure: RADIOACTIVE SEED IMPLANT/BRACHYTHERAPY IMPLANT;  Surgeon: HArdis Hughs MD;  [facility]  Service: [facility];  Laterality: N/A;   657    SEEDS IMPLANTED   RIGHT/LEFT HEART CATH AND CORONARY ANGIOGRAPHY N/A 02/09/2020   Procedure: RIGHT/LEFT HEART CATH AND CORONARY ANGIOGRAPHY;  Surgeon: ENelva Bush MD;  [place];  Service: Cardiovascular;  Laterality: N/A;   SPACE OAR INSTILLATION N/A 04/11/2018   Procedure: SPACE OAR INSTILLATION;  Surgeon: HArdis Hughs MD;  Location:  [place];  Service: Urology;  Laterality: N/A;   TEE WITHOUT CARDIOVERSION N/A 02/11/2020   Procedure: TRANSESOPHAGEAL ECHOCARDIOGRAM (TEE);  Surgeon: Geralynn Rile, MD;  [place];  Service: Cardiovascular;  Laterality: N/A;   TOTAL KNEE ARTHROPLASTY Bilateral  09/13/2015   Procedure: BILATERAL KNEE ARTHROPLASTY ;  Surgeon: Paralee Cancel, MD;  [facility];  Service: Orthopedics;  Laterality: Bilateral;    Allergies  Allergies  Allergen Reactions   Cefadroxil Hives and Palpitations    EMS to ED from [facility].   Diltiazem Hcl Swelling   Chlorthalidone Other (See Comments)    "Makes me light-headed and I don't like the way it makes me feel"   Voltaren [Diclofenac] Rash    History of Present Illness    BYREN AST  is a 72 year old male with the above mention past medical history who presents today for 76-monthfollow-up of congestive heart failure.  Mr. CArgentina Ponderwas initially seen by Dr. PJohney Framein 2021 by referral from his PCP for evaluation of atrial flutter and worsening lower extremity edema.  [facility]  He was diagnosed with AF with RVR but left AMA and was evaluated by cardiology.  He was not placed on anticoagulation due to concerns with imbalance.  He was admitted placed on Lasix drip and underwent R/LHC with no significant coronary disease and pulmonary artery pressure, mildly reduced.  Echo showed LVEF of <20% with global hypokinesis, biatrial enlargement, mild MR, and moderate bilateral pleural effusions. RV mildly enlarged but systolic function normal. DCCV on 02/11/2020 that was successful hypotension and was placed on Levophed drip postprocedure.  He was started on amiodarone and Eliquis 5 mg prior to discharge.  He was seen in follow-up on 03/31/2020 and amiodarone was decreased to 200 mg daily and torsemide was decreased to 20 mg daily.  He was seen in clinic on 07/2021 where he was back in atrial flutter and underwent DCCV on 08/23/2021 with return to sinus rhythm.  He was evaluated by Dr. TLovena Lefor consideration of ablation and elected to think about it further.  2D echo was completed with EF of 55-60% and grade 1 DD with mild aortic dilation.  He was last seen by Dr.  PJohney Frameon 03/31/2022 and was slowly healing from his right lower extremity foot surgery and was needing hip replacement.  During visit patient was euvolemic was off of his torsemide.  Amiodarone was decreased to 100 mg daily with plan to wean in 3 months.  Mr. CParrapresents today for 330-monthollow-up alone.  Since last being seen in the office patient reports that he is doing well with no new cardiac complaints.  He is euvolemic on exam today and reports that he is using his torsemide as needed and not daily due to minimal lower extremity swelling.  He is currently sinus rhythm with a rate of 67 bpm and heart rate of 108/60.  He is complian all t with his current medications and denies any adverse reactions.  He denies any palpitations or irregular heartbeat since his previous visit.  He is continuing to heal from his previous foot surgery.  He does stay active and completes activities around his house and walks as tolerated.  Patient denies chest pain, palpitations, dyspnea, PND, orthopnea, nausea, vomiting, dizziness, syncope, edema, weight gain, or early satiety.   Home Medications    Current Outpatient Medications  Medication Sig Dispense  Refill   acetaminophen (TYLENOL) 650 MG CR tablet Take 1,300 mg by mouth 2 (two) times daily as needed for pain.     amiodarone (PACERONE) 100 MG tablet Take 1 tablet (100 mg total) by mouth daily. 90 tablet 1   apixaban (ELIQUIS) 5 MG TABS tablet Take 1 tablet (5 mg total) by mouth 2 (two) times daily. 180 tablet 1   atorvastatin (LIPITOR) 40 MG tablet Take 40 mg by mouth daily.     diazepam (VALIUM) 5 MG tablet Take 5 mg by mouth at bedtime.     DULoxetine (CYMBALTA) 60 MG capsule TAKE 1 CAPSULE BY MOUTH EVERY DAY - 90 capsule 1   lisinopril (ZESTRIL) 10 MG tablet Take 1 tablet (10 mg total) by mouth daily. (Patient taking differently: Take 10 mg by mouth every evening.) 90 tablet 3   metoprolol succinate (TOPROL XL) 25 MG 24 hr tablet Take 1 tablet (25  mg total) by mouth daily. 90 tablet 3   NARCAN 4 MG/0.1ML LIQD nasal spray kit Place 1 spray into the nose as needed (as directed for emergency). 2 each 0   oxycodone (ROXICODONE) 30 MG immediate release tablet Take 1 tablet (30 mg total) by mouth every 4 (four) hours as needed fo pain (Patient taking differently: Take 30 mg by mouth every 4 (four) hours as needed for pain.) 42 tablet 0   sulfamethoxazole-trimethoprim (BACTRIM DS) 800-160 MG tablet TAKE 1 TABLET BY MOUTH TWICE A DAY 30 tablet 1   tamsulosin (FLOMAX) 0.4 MG CAPS capsule Take 0.4 mg by mouth daily.     testosterone cypionate (DEPOTESTOSTERONE CYPIONATE) 200 MG/ML injection Inject 100 mg into the muscle every Friday.     torsemide (DEMADEX) 20 MG tablet Take 1 tablet by mouth daily.     No current facility-administered medications for this visit.     Review of Systems  Please see the history of present illness.    (+) Recovering right foot surgery (+) Shortness of breath with heavy exertion  All other systems reviewed and are otherwise negative except as noted above.  Physical Exam    Wt Readings from Last 3 Encounters:  06/30/22 213 lb (96.6 kg)  06/20/22 210 lb 6.4 oz (95.4 kg)  03/31/22 212 lb (96.2 kg)   VS: Vitals:   06/30/22 1020  BP: 108/60  Pulse: 67  SpO2: 97%  ,Body mass index is 23.4 kg/m.  Constitutional:      Appearance: Healthy appearance. Not in distress.  Neck:     Vascular: JVD normal.  Pulmonary:     Effort: Pulmonary effort is normal.     Breath sounds: No wheezing. No rales. Diminished in the bases Cardiovascular:     Normal rate. Regular rhythm. Normal S1. Normal S2.      Murmurs: There is no murmur.  Edema:    Peripheral edema absent.  Abdominal:     Palpations: Abdomen is soft non tender. There is no hepatomegaly.  Skin:    General: Skin is warm and dry.  Neurological:     General: No focal deficit present.     Mental Status: Alert and oriented to person, place and time.      Cranial Nerves: Cranial nerves are intact.  EKG/LABS/ Recent Cardiac Studies    ECG personally reviewed by me today -none completed today  Risk Assessment/Calculations:    CHA2DS2-VASc Score = 4   This indicates a 4.8% annual risk of stroke. The patient's score is based upon: CHF History: 1  HTN History: 1 Diabetes History: 0 Stroke History: 0 Vascular Disease History: 1 Age Score: 1 Gender Score: 0           Lab Results  Component Value Date   WBC 5.1 03/10/2022   HGB 11.2 (L) 03/10/2022   HCT 37.4 (L) 03/10/2022   MCV 77.9 (L) 03/10/2022   PLT 370 03/10/2022   Lab Results  Component Value Date   CREATININE 0.75 03/10/2022   BUN 10 03/10/2022   NA 142 03/10/2022   K 3.8 03/10/2022   CL 109 03/10/2022   CO2 23 03/10/2022   Lab Results  Component Value Date   ALT 12 06/21/2021   AST 17 06/21/2021   ALKPHOS 89 03/26/2020   BILITOT 0.4 06/21/2021   Lab Results  Component Value Date   CHOL 111 02/04/2020   HDL 32 (L) 02/04/2020   LDLCALC 65 02/04/2020   LDLDIRECT 94 12/06/2010   TRIG 72 02/04/2020   CHOLHDL 3.5 02/04/2020    Lab Results  Component Value Date   HGBA1C 6.2 (H) 02/03/2020    Cardiac Studies & Procedures   CARDIAC CATHETERIZATION  CARDIAC CATHETERIZATION 02/09/2020  Narrative Conclusions: 1. No angiographically significant coronary artery disease. 2. Upper normal left heart, right heart, and pulmonary artery pressures. 3. Mildly reduced Fick cardiac output/index.  Recommendations: 1. Restart heparin infusion 2 hours after TR band removal.  If there is no evidence of bleeding/vascular complication, consider transitioning back to apixaban as soon as tomorrow. 2. Proceed with TEE/cardioversion as soon as tomorrow.  I will make Mr. Hargan NPO after midnight in anticipation of this. 3. Optimize evidence-based heart failure therapy for non-ischemic cardiomyopathy. 4. Primary prevention of coronary artery disease.  Nelva Bush,  MD [facility]  Findings Coronary Findings Diagnostic  Dominance: Right  Left Main Vessel is large. Vessel is angiographically normal.  Left Anterior Descending Vessel is large. The vessel exhibits minimal luminal irregularities.  First Diagonal Branch Vessel is large in size.  Left Circumflex Vessel is large. Vessel is angiographically normal.  First Obtuse Marginal Branch Vessel is moderate in size.  Second Obtuse Marginal Branch Vessel is moderate in size.  Third Obtuse Marginal Branch Vessel is small in size.  Right Coronary Artery Vessel is moderate in size. Vessel is angiographically normal.  Right Posterior Descending Artery Vessel is small in size.  Right Posterior Atrioventricular Artery Vessel is moderate in size.  Intervention  No interventions have been documented.     ECHOCARDIOGRAM  ECHOCARDIOGRAM LIMITED 11/10/2021  Narrative ECHOCARDIOGRAM LIMITED REPORT    Patient Name:   [name] Date of Exam: 11/10/2021 Medical Rec #:  [id number]       Height:       80.0 in Accession #:    [id number]      Weight:       225.6 lb Date of Birth:  [birth year]       BSA:          2.420 m Patient Age:    76 years        BP:           139/91 mmHg Patient Gender: M               HR:           69 bpm. Exam Location:  [address]  Procedure: 3D Echo, Limited Echo, Cardiac Doppler and Limited Color Doppler  Indications:    I42.8 Nonischemic Cardiomyopathy  History:  Patient has prior history of Echocardiogram examinations, most recent 08/18/2021. Arrythmias:Atrial Flutter; Risk Factors:Hypertension.  Sonographer:    Marygrace Drought RCS Referring Phys: [place]   1. Limited echo for LV function 2. Left ventricular ejection fraction, by estimation, is 55 to 60%. Left ventricular ejection fraction by 3D volume is 56 %. The left ventricle has normal function. The left ventricle has no regional wall motion abnormalities.  There is mild left ventricular hypertrophy. Left ventricular diastolic parameters are consistent with Grade I diastolic dysfunction (impaired relaxation). 3. The aortic valve is tricuspid. Aortic valve regurgitation is not visualized. 4. Aortic dilatation noted. There is mild dilatation of the aortic root, measuring 41 mm.  Comparison(s): Changes from prior study are noted. 08/18/2021: LVEF 30-35%.  FINDINGS Left Ventricle: Left ventricular ejection fraction, by estimation, is 55 to 60%. Left ventricular ejection fraction by 3D volume is 56 %. The left ventricle has normal function. The left ventricle has no regional wall motion abnormalities. The left ventricular internal cavity size was normal in size. There is mild left ventricular hypertrophy. Left ventricular diastolic parameters are consistent with Grade I diastolic dysfunction (impaired relaxation). Indeterminate filling pressures.  Aortic Valve: The aortic valve is tricuspid. Aortic valve regurgitation is not visualized.  Pulmonic Valve: The pulmonic valve was not well visualized. Pulmonic valve regurgitation is mild.  Aorta: Aortic dilatation noted. There is mild dilatation of the aortic root, measuring 41 mm.  LEFT VENTRICLE PLAX 2D LVIDd:         4.50 cm         Diastology LVIDs:         2.90 cm         LV e' medial:    6.96 cm/s LV PW:         1.10 cm         LV E/e' medial:  8.8 LV IVS:        1.10 cm         LV e' lateral:   6.20 cm/s LVOT diam:     2.10 cm         LV E/e' lateral: 9.9 LVOT Area:     3.46 cm  3D Volume EF LV 3D EF:    Left ventricul ar ejection fraction by 3D volume is 56 %.  3D Volume EF: 3D EF:        56 % LV EDV:       167 ml LV ESV:       74 ml LV SV:        94 ml  LEFT ATRIUM         Index LA diam:    5.00 cm 2.07 cm/m  AORTA Ao Root diam: 4.10 cm Ao Asc diam:  3.40 cm  MITRAL VALVE MV Area (PHT):             SHUNTS MV Decel Time:             Systemic Diam: 2.10 cm MV E  velocity: 61.30 cm/s MV A velocity: 73.40 cm/s MV E/A ratio:  0.84  Lyman Bishop MD Electronically signed by Lyman Bishop MD Signature Date/Time: 11/10/2021/3:40:08 PM    Final   TEE  ECHO TEE 02/11/2020  Narrative TRANSESOPHOGEAL ECHO REPORT    Patient Name:   [name] Date of Exam: 02/11/2020 Medical Rec #:  [id number]       Height:       80.0 in Accession #:  [id number]      Weight:       185.8 lb Date of Birth:  1950-05-15       BSA:          2.229 m Patient Age:    51 years        BP:           106/79 mmHg Patient Gender: M               HR:           114 bpm. Exam Location:  Inpatient  Procedure: 3D Echo, Transesophageal Echo, Cardiac Doppler and Color Doppler  Indications:     I48.92* Unspecified atrial flutter  History:         Patient has prior history of Echocardiogram examinations, most recent 02/02/2020. Abnormal ECG, Arrythmias:Atrial Flutter, Signs/Symptoms:Dyspnea; Risk Factors:Hypertension. Cancer. Edema.  Sonographer:     Roseanna Rainbow RDCS Referring Phys:  [id number] [place] Diagnosing Phys: Eleonore Chiquito MD  PROCEDURE: After discussion of the risks and benefits of a TEE, an informed consent was obtained from the patient. TEE procedure time was 18 minutes. The transesophogeal probe was passed without difficulty through the esophogus of the patient. Imaged were obtained with the patient in a left lateral decubitus position. Local oropharyngeal anesthetic was provided with Cetacaine. Sedation performed by different physician. The patient was monitored while under deep sedation. Anesthestetic sedation was provided intravenously by Anesthesiology: '260mg'$  of Propofol, '40mg'$  of Lidocaine. The patient's vital signs; including heart rate, blood pressure, and oxygen saturation; remained stable throughout the procedure. The patient developed no complications during the procedure. A successful direct current cardioversion was performed at 120 joules  with 1 attempt.  IMPRESSIONS   1. Severely reduced LV function, LVEF~10-15%. LVOT VTI ~5.5 cm. Left ventricular ejection fraction, by estimation, is 10-15%. The left ventricle has severely decreased function. The left ventricle demonstrates global hypokinesis. The left ventricular internal cavity size was moderately to severely dilated. 2. Evidence of atrial level shunting detected by color flow Doppler. Agitated saline contrast bubble study was positive with shunting observed within 3-6 cardiac cycles suggestive of interatrial shunt. There is a small patent foramen ovale with predominantly left to right shunting across the atrial septum. 3. Right ventricular systolic function is severely reduced. The right ventricular size is severely enlarged. 4. Left atrial size was mild to moderately dilated. No left atrial/left atrial appendage thrombus was detected. The LAA emptying velocity was 21 cm/s. 5. Right atrial size was mildly dilated. 6. The mitral valve is grossly normal. Mild mitral valve regurgitation. No evidence of mitral stenosis. 7. Tricuspid valve regurgitation is mild to moderate. 8. The aortic valve is tricuspid. Aortic valve regurgitation is trivial. No aortic stenosis is present.  FINDINGS Left Ventricle: Severely reduced LV function, LVEF~10-15%. LVOT VTI ~5.5 cm. Left ventricular ejection fraction, by estimation, is 10-15%. The left ventricle has severely decreased function. The left ventricle demonstrates global hypokinesis. The left ventricular internal cavity size was moderately to severely dilated.  Right Ventricle: The right ventricular size is severely enlarged. No increase in right ventricular wall thickness. Right ventricular systolic function is severely reduced.  Left Atrium: Left atrial size was mild to moderately dilated. No left atrial/left atrial appendage thrombus was detected. The LAA emptying velocity was 21 cm/s.  Right Atrium: Right atrial size was mildly  dilated.  Pericardium: There is no evidence of pericardial effusion.  Mitral Valve: The mitral valve is grossly normal. Mild mitral valve regurgitation. No evidence  of mitral valve stenosis.  Tricuspid Valve: The tricuspid valve is grossly normal. Tricuspid valve regurgitation is mild to moderate. No evidence of tricuspid stenosis.  Aortic Valve: The aortic valve is tricuspid. Aortic valve regurgitation is trivial. No aortic stenosis is present.  Pulmonic Valve: The pulmonic valve was grossly normal. Pulmonic valve regurgitation is trivial. No evidence of pulmonic stenosis.  Aorta: The aortic root and ascending aorta are structurally normal, with no evidence of dilitation. There is minimal (Grade I) layered plaque involving the descending aorta.  Venous: The left upper pulmonary vein is normal.  IAS/Shunts: Evidence of atrial level shunting detected by color flow Doppler. Agitated saline contrast was given intravenously to evaluate for intracardiac shunting. Agitated saline contrast bubble study was positive with shunting observed within 3-6 cardiac cycles suggestive of interatrial shunt. A small patent foramen ovale is detected with predominantly left to right shunting across the atrial septum.   LEFT VENTRICLE PLAX 2D LVOT diam:     2.30 cm LV SV:         23 LV SV Index:   10 LVOT Area:     4.15 cm   AORTIC VALVE LVOT Vmax:   44.88 cm/s LVOT Vmean:  32.166 cm/s LVOT VTI:    0.056 m  AORTA Ao Root diam: 3.90 cm Ao Asc diam:  3.30 cm  TRICUSPID VALVE TR Peak grad:   24.6 mmHg TR Vmax:        248.00 cm/s  SHUNTS Systemic VTI:  0.06 m Systemic Diam: 2.30 cm  Eleonore Chiquito MD Electronically signed by Eleonore Chiquito MD Signature Date/Time: 02/11/2020/6:55:02 PM    Final            Assessment & Plan    1.  Chronic systolic CHF: -Previous TTE with EF decreased to less than 20% the setting of atrial flutter.  Most recent 2D completed 10/2021 with EF of 55 to 60% and  grade 1 DD -Today patient is euvolemic on exam and is using torsemide as needed for increased swelling only. -He is abstaining from excess salt in his diet -Low sodium diet, fluid restriction <2L, and daily weights encouraged. Educated to contact our office for weight gain of 2 lbs overnight or 5 lbs in one week.   2.  History of atrial flutter: -Patient is currently on amiodarone 100 mg daily and maintaining sinus rhythm. -He denies any palpitations since previous visit. -Continue Eliquis 5 mg twice daily and rate control with metoprolol 25 mg and amiodarone 100 mg daily -Current dose appropriate based on previous creatinine and hemoglobin -CHA2DS2-VASc Score = 4 [CHF History: 1, HTN History: 1, Diabetes History: 0, Stroke History: 0, Vascular Disease History: 1, Age Score: 1, Gender Score: 0].  Therefore, the patient's annual risk of stroke is 4.8 %.      3.  Essential hypertension: -Patient's blood pressure today is well-controlled at 108/60 -Continue lisinopril 10 mg and metoprolol 25 mg daily  4.  Hyperlipidemia: -Most recent LDL cholesterol was at goal at 65 -Continue atorvastatin 40 mg daily  Disposition: Follow-up with Freada Bergeron, MD or APP in 6 months    Medication Adjustments/Labs and Tests Ordered: Current medicines are reviewed at length with the patient today.  Concerns regarding medicines are outlined above.   Signed, Mable Fill, Marissa Nestle, NP 06/30/2022, 10:41 AM [facility]  Note:  This document was prepared using Dragon voice recognition software and may include unintentional dictation errors.

## 2022-06-30 ENCOUNTER — Encounter: Payer: Self-pay | Admitting: Nurse Practitioner

## 2022-06-30 ENCOUNTER — Encounter: Payer: BLUE CROSS/BLUE SHIELD | Admitting: Family

## 2022-06-30 ENCOUNTER — Ambulatory Visit: Payer: Medicare Other | Attending: Nurse Practitioner | Admitting: Nurse Practitioner

## 2022-06-30 VITALS — BP 108/60 | HR 67 | Ht >= 80 in | Wt 213.0 lb

## 2022-06-30 DIAGNOSIS — I10 Essential (primary) hypertension: Secondary | ICD-10-CM

## 2022-06-30 DIAGNOSIS — I5042 Chronic combined systolic (congestive) and diastolic (congestive) heart failure: Secondary | ICD-10-CM | POA: Diagnosis not present

## 2022-06-30 DIAGNOSIS — I428 Other cardiomyopathies: Secondary | ICD-10-CM | POA: Diagnosis not present

## 2022-06-30 DIAGNOSIS — I4892 Unspecified atrial flutter: Secondary | ICD-10-CM | POA: Diagnosis not present

## 2022-06-30 NOTE — Patient Instructions (Signed)
Medication Instructions:  Your physician recommends that you continue on your current medications as directed. Please refer to the Current Medication list given to you today. *If you need a refill on your cardiac medications before your next appointment, please call your pharmacy*   Lab Work: TODAY-CMET & TSH If you have labs (blood work) drawn today and your tests are completely normal, you will receive your results only by: [place] (if you have MyChart) OR A paper copy in the mail If you have any lab test that is abnormal or we need to change your treatment, we will call you to review the results.   Testing/Procedures: None ordered   Follow-Up: At [facility], you and your health needs are our priority.  As part of our continuing mission to provide you with exceptional heart care, we have created designated Provider Care Teams.  These Care Teams include your primary Cardiologist (physician) and Advanced Practice Providers (APPs -  Physician Assistants and Nurse Practitioners) who all work together to provide you with the care you need, when you need it.  We recommend signing up for the patient portal called "MyChart".  Sign up information is provided on this After Visit Summary.  MyChart is used to connect with patients for Virtual Visits (Telemedicine).  Patients are able to view lab/test results, encounter notes, upcoming appointments, etc.  Non-urgent messages can be sent to your provider as well.   To learn more about what you can do with MyChart, go to [email or web address].    Your next appointment:   6 month(s)  Provider:   Freada Bergeron, MD     Other Instructions

## 2022-07-01 LAB — COMPREHENSIVE METABOLIC PANEL
ALT: 15 IU/L (ref 0–44)
AST: 19 IU/L (ref 0–40)
Albumin/Globulin Ratio: 1.5 (ref 1.2–2.2)
Albumin: 4 g/dL (ref 3.9–4.9)
Alkaline Phosphatase: 113 IU/L (ref 44–121)
BUN/Creatinine Ratio: 18 (ref 10–24)
BUN: 17 mg/dL (ref 8–27)
Bilirubin Total: 0.2 mg/dL (ref 0.0–1.2)
CO2: 24 mmol/L (ref 20–29)
Calcium: 8.8 mg/dL (ref 8.6–10.2)
Chloride: 101 mmol/L (ref 96–106)
Creatinine, Ser: 0.97 mg/dL (ref 0.76–1.27)
Globulin, Total: 2.7 g/dL (ref 1.5–4.5)
Glucose: 82 mg/dL (ref 70–99)
Potassium: 4.3 mmol/L (ref 3.5–5.2)
Sodium: 138 mmol/L (ref 134–144)
Total Protein: 6.7 g/dL (ref 6.0–8.5)
eGFR: 85 mL/min/{1.73_m2} (ref >59)

## 2022-07-01 LAB — TSH: TSH: 6.26 u[IU]/mL — ABNORMAL HIGH (ref 0.450–4.500)

## 2022-07-04 ENCOUNTER — Encounter: Payer: BLUE CROSS/BLUE SHIELD | Admitting: Family

## 2022-07-04 ENCOUNTER — Other Ambulatory Visit: Payer: Self-pay

## 2022-07-04 DIAGNOSIS — R7989 Other specified abnormal findings of blood chemistry: Secondary | ICD-10-CM

## 2022-07-05 ENCOUNTER — Encounter: Payer: Self-pay | Admitting: Family

## 2022-07-05 ENCOUNTER — Ambulatory Visit (INDEPENDENT_AMBULATORY_CARE_PROVIDER_SITE_OTHER): Payer: Medicare Other | Admitting: Family

## 2022-07-05 DIAGNOSIS — Z89431 Acquired absence of right foot: Secondary | ICD-10-CM

## 2022-07-05 NOTE — Progress Notes (Signed)
Post-Op Visit Note   Patient: Thomas [name]           Date of Birth: 01/22/1951           MRN: [id number] Visit Date: 07/05/2022 PCP: [name], MD  Chief Complaint:  Chief Complaint  Patient presents with   Right Foot - Follow-up    03/10/22 right foot chopart amputation    HPI:  HPI The patient is a 72 year old gentleman seen in follow-up status post Chopart amputation on the right which is slowly been healing.  He is eager to return to work.  Has been doing Silvadene dressings Ortho Exam On examination of the right foot over the medial border of his incision he continues with an open area that is 2 cm x 8 mm there is no proud tissue flat pink tissue there is scant surrounding maceration   he does have significant swelling of the ankle without erythema warmth or pain  Visit Diagnoses: No diagnosis found.  Plan: Low suspicion for infection.  He continues on doxycycline.  He will begin using silver cell for dressing changes and follow-up in 4 weeks  Follow-Up Instructions: No follow-ups on file.   Imaging: No results found.  Orders:  No orders of the defined types were placed in this encounter.  No orders of the defined types were placed in this encounter.    PMFS History: Patient Active Problem List   Diagnosis Date Noted   Depressed mood 06/20/2022   Dehiscence of amputation stump ([place]) 03/10/2022   History of transmetatarsal amputation of right foot ([place]) 02/15/2022   Chronic osteomyelitis of toe, right ([place]) 02/10/2022   Fracture of intertrochanteric section of femur, closed, right, with nonunion, subsequent encounter 12/24/2020   Inguinal hernia 10/18/2020   Chronic combined systolic and diastolic CHF, NYHA class 2 ([place])    Osteoporosis 05/05/2020   Foot infection    NICM (nonischemic cardiomyopathy) ([place]), no significant CAD on cardiac cath   02/12/2020   Right foot pain    Rheumatoid arthritis ([place])    Atrial flutter ([place]) 02/02/2020    Lower extremity edema 12/30/2019   Weight loss, non-intentional 11/24/2019   Chronic right hip pain 06/19/2019   Degenerative tear of acetabular labrum of right hip 05/02/2019   Primary osteoarthritis of right hip 05/02/2019   Skin ulcer ([place]) 01/29/2019   Malignant neoplasm of prostate ([place]) 01/16/2018   Insomnia 10/02/2017   S/P bilateral TKA 09/13/2015   Other bilateral secondary osteoarthritis of knee 07/14/2014   Heme positive stool 05/21/2013   Osteoarthritis, multiple sites 01/13/2011   Hypertension 12/06/2010   CLAUSTROPHOBIA 03/01/2010   Past Medical History:  Diagnosis Date   Anemia    Atrial flutter ([place]) 01/2020   Cancer ([place])    prostate   COVID 2021   mild case   Dysrhythmia    Aflutter   Heart failure with reduced ejection fraction (HCC)    Hypertension    Hypoglycemia    occ   NICM (nonischemic cardiomyopathy) ([place]) 02/12/2020   Prostate cancer ([place]) 2019   Rheumatoid arthritis ([place])     Family History  Problem Relation Age of Onset   Prostate cancer Father    Prostate cancer Brother    Colon cancer Neg Hx    Rectal cancer Neg Hx    Stomach cancer Neg Hx     Past Surgical History:  Procedure Laterality Date   AMPUTATION Right 02/10/2022   Procedure: RIGHT TRANSMETATARSAL AMPUTATION;  Surgeon: Meridee Score  V, MD;  Location: [place];  Service: Orthopedics;  Laterality: Right;   AMPUTATION Right 03/10/2022   Procedure: RIGHT FOOT CHOPART AMPUTATION;  Surgeon: Newt Minion, MD;  Location: [place];  Service: Orthopedics;  Laterality: Right;   AMPUTATION TOE Right 03/26/2020   Procedure: Right 3rd toe partial amputation, bone biopsy right 1st metatarsal;  Surgeon: Evelina Bucy, DPM;  [facility];  Service: Podiatry;  Laterality: Right;   BUBBLE STUDY  02/11/2020   Procedure: BUBBLE STUDY;  Surgeon: Geralynn Rile, MD;  Location: [place];  Service: Cardiovascular;;   CARDIOVERSION N/A 02/11/2020   Procedure: CARDIOVERSION;  Surgeon:  Geralynn Rile, MD;  Location: [place];  Service: Cardiovascular;  Laterality: N/A;   CARDIOVERSION N/A 08/31/2021   Procedure: CARDIOVERSION;  Surgeon: Jerline Pain, MD;  Location: [facility] ENDOSCOPY;  Service: Cardiovascular;  Laterality: N/A;   CYSTOSCOPY  04/11/2018   Procedure: Erlene Quan;  Surgeon: [name], MD;  Location[facility];  Service: Urology;;  NO SEEDS FOUND IN BLADDER   HERNIA REPAIR  2009   inguinal   INTRAMEDULLARY (IM) NAIL INTERTROCHANTERIC Right 05/05/2020   Procedure: CPT 27245-Cephalomedullary nailing of right intertrochanteric femur fracture;  Surgeon: Shona Needles, MD;  Location: [place];  Service: Orthopedics;  Laterality: Right;   INTRAMEDULLARY (IM) NAIL INTERTROCHANTERIC Right 12/24/2020   Procedure: REVISION FIXATION OF RIGHT INTERTROCHANTERIC FEMUR NONUNION;  Surgeon: Shona Needles, MD;  Location: [place];  Service: Orthopedics;  Laterality: Right;   IRRIGATION AND DEBRIDEMENT FOOT Left 03/26/2020   Procedure: Left foot incision and drainage with removal of all non-viable soft tissue and bone - areas overlying the 2nd and 5th metatarsals.;  Surgeon: Evelina Bucy, DPM;  [facility];  Service: Podiatry;  Laterality: Left;   RADIOACTIVE SEED IMPLANT N/A 04/11/2018   Procedure: RADIOACTIVE SEED IMPLANT/BRACHYTHERAPY IMPLANT;  Surgeon: [name], MD;  Location: [facility];  Service: Urology;  Laterality: N/A;   5     SEEDS IMPLANTED   RIGHT/LEFT HEART CATH AND CORONARY ANGIOGRAPHY N/A 02/09/2020   Procedure: RIGHT/LEFT HEART CATH AND CORONARY ANGIOGRAPHY;  Surgeon: Nelva Bush, MD;  Location: [place];  Service: Cardiovascular;  Laterality: N/A;   SPACE OAR INSTILLATION N/A 04/11/2018   Procedure: SPACE OAR INSTILLATION;  Surgeon: [name], MD;  Location: [facility];  Service: Urology;  Laterality: N/A;   TEE WITHOUT CARDIOVERSION N/A 02/11/2020    Procedure: TRANSESOPHAGEAL ECHOCARDIOGRAM (TEE);  Surgeon: Geralynn Rile, MD;  Location: [place];  Service: Cardiovascular;  Laterality: N/A;   TOTAL KNEE ARTHROPLASTY Bilateral 09/13/2015   Procedure: BILATERAL KNEE ARTHROPLASTY ;  Surgeon: Paralee Cancel, MD;  [facility];  Service: Orthopedics;  Laterality: Bilateral;   Social History   Occupational History   Not on file  Tobacco Use   Smoking status: Never   Smokeless tobacco: Never  Vaping Use   Vaping Use: Never used  Substance and Sexual Activity   Alcohol use: Yes    Comment: 2 beers a week (occasionally)   Drug use: Not Currently   Sexual activity: Yes

## 2022-07-10 ENCOUNTER — Other Ambulatory Visit: Payer: Self-pay

## 2022-07-10 MED ORDER — METOPROLOL SUCCINATE ER 25 MG PO TB24: 25.0000 mg | ORAL_TABLET | Freq: Every day | ORAL | 3 refills | Status: DC

## 2022-07-11 ENCOUNTER — Ambulatory Visit: Payer: Medicare Other

## 2022-07-18 ENCOUNTER — Ambulatory Visit: Payer: Medicare Other | Attending: Family Medicine

## 2022-07-26 ENCOUNTER — Telehealth: Payer: Self-pay | Admitting: Family

## 2022-07-26 MED ORDER — SILVER SULFADIAZINE 1 % EX CREA: 1.0000 | TOPICAL_CREAM | Freq: Every day | CUTANEOUS | 0 refills | Status: DC

## 2022-07-26 NOTE — Telephone Encounter (Signed)
Patient states the medication that Junie Panning gave him in the container is gone and he wanted to get more but doesn't know what it is.Marland Kitchen

## 2022-07-26 NOTE — Telephone Encounter (Signed)
It looks like from his 06/02/22 he was given silvadene and wants more. Can this be sent to his pharmacy?

## 2022-07-26 NOTE — Addendum Note (Signed)
Addended by: Suzan Slick on: 07/26/2022 09:43 AM   Modules accepted: Orders

## 2022-07-26 NOTE — Telephone Encounter (Signed)
[facility]

## 2022-07-26 NOTE — Telephone Encounter (Signed)
Pt informed

## 2022-08-08 ENCOUNTER — Ambulatory Visit: Payer: Medicare Other | Admitting: Family

## 2022-08-08 ENCOUNTER — Other Ambulatory Visit (INDEPENDENT_AMBULATORY_CARE_PROVIDER_SITE_OTHER): Payer: Medicare Other

## 2022-08-08 ENCOUNTER — Telehealth: Payer: Self-pay | Admitting: Orthopedic Surgery

## 2022-08-08 DIAGNOSIS — M869 Osteomyelitis, unspecified: Secondary | ICD-10-CM

## 2022-08-08 DIAGNOSIS — L97521 Non-pressure chronic ulcer of other part of left foot limited to breakdown of skin: Secondary | ICD-10-CM | POA: Diagnosis not present

## 2022-08-08 DIAGNOSIS — S72141D Displaced intertrochanteric fracture of right femur, subsequent encounter for closed fracture with routine healing: Secondary | ICD-10-CM | POA: Diagnosis not present

## 2022-08-08 NOTE — Progress Notes (Unsigned)
Office Visit Note   Patient: Thomas [name]           Date of Birth: 02-19-1951           MRN: [id number] Visit Date: 08/08/2022              Requested by: Carney Living, MD [address] [place],  [place] [address] PCP: Carney Living, MD  Chief Complaint  Patient presents with   Right Foot - Follow-up    03/10/2022 right chopart amputation       HPI: The patient is a 72 year old gentleman who is seen today for 2 separate issues he is seen status post right Chopart amputation November 17 of last year this has been slow to heal.  He is currently on doxycycline doing silver cell dressing changes to the right foot daily.  He also reports a weeks to months history of ulcers on his left foot beneath the MTP joint of the great toe as well as to the tip of the third toe.  The third toe has been having increased swelling and redness and warmth and pain.  He has been having scant drainage denies fevers or chills  Assessment & Plan: Visit Diagnoses:  1. Osteomyelitis of third toe of right foot   2. Toe ulcer, left, limited to breakdown of skin     Plan: Recommended proceeding with left third toe amputation.  He will check with Dr. Jena Gauss who he is planning to have hip replacement with.  Plans to call back and let us know what his plans are for the left third toe.  Continue with daily dose of cleansing silver cell dressings of the wound.  Follow-Up Instructions: No follow-ups on file.   Ortho Exam  Patient is alert, oriented, no adenopathy, well-dressed, normal affect, normal respiratory effort. On examination of the right foot his transmetatarsal amputation is nearly healed there is a 2 mm open area the length of his incision this is filled in with pink flat tissue no drainage no surrounding maceration or callus on examination of the left foot beneath the MTP joint of the great toe there is a 1 cm in diameter callused ulceration this was debrided with a 10 blade  knife back to viable tissue there 3 mm of depth there is scant bloody drainage and granulation.  To the left third toe there is sausage digit swelling with erythema and warmth there is a 5 mm diameter open ulcer there is no purulence.  No ascending cellulitis on the left.  Does have a palpable dorsalis pedis pulse. Imaging: No results found.   Labs: Lab Results  Component Value Date   HGBA1C 6.2 (H) 02/03/2020   HGBA1C 5.3 10/28/2015   HGBA1C 5.6 08/10/2014   ESRSEDRATE 14 06/21/2021   ESRSEDRATE 17 05/18/2021   ESRSEDRATE 20 (H) 03/28/2020   CRP 19.2 (H) 06/21/2021   CRP 11.1 (H) 05/18/2021   CRP 2.7 (H) 03/28/2020   REPTSTATUS 03/31/2020 FINAL 03/26/2020   GRAMSTAIN NO WBC SEEN NO ORGANISMS SEEN  03/26/2020   CULT  03/26/2020    No growth aerobically or anaerobically. Performed at [facility], [address], [place], [place] [address]    [facility] STAPHYLOCOCCUS AUREUS 03/26/2020     Lab Results  Component Value Date   ALBUMIN 4.0 06/30/2022   ALBUMIN 3.0 (L) 03/26/2020   ALBUMIN 3.6 03/25/2020    Lab Results  Component Value Date   MG 1.8 05/22/2021   MG  2.0 02/12/2020   MG 1.7 02/11/2020   Lab Results  Component Value Date   VD25OH 28.61 (L) 05/05/2020    No results found for: "PREALBUMIN"    Latest Ref Rng & Units 03/10/2022   10:10 AM 02/03/2022    2:24 PM 08/26/2021   10:15 AM  CBC EXTENDED  WBC 4.0 - 10.5 K/uL 5.1  10.6  9.0   RBC 4.22 - 5.81 MIL/uL 4.80  4.84  4.89   Hemoglobin 13.0 - 17.0 g/dL 16.1  09.6  04.5   HCT 39.0 - 52.0 % 37.4  39.6  38.6   Platelets 150 - 400 K/uL 370  311  337      There is no height or weight on file to calculate BMI.  Orders:  Orders Placed This Encounter  Procedures   XR Foot 2 Views Left   No orders of the defined types were placed in this encounter.    Procedures: No procedures performed  Clinical Data: No additional findings.  ROS:  All other systems negative, except as noted in the  HPI. Review of Systems  Objective: Vital Signs: There were no vitals taken for this visit.  Specialty Comments:  No specialty comments available.  PMFS History: Patient Active Problem List   Diagnosis Date Noted   Depressed mood 06/20/2022   Dehiscence of amputation stump 03/10/2022   History of transmetatarsal amputation of right foot 02/15/2022   Chronic osteomyelitis of toe, right 02/10/2022   Fracture of intertrochanteric section of femur, closed, right, with nonunion, subsequent encounter 12/24/2020   Inguinal hernia 10/18/2020   Chronic combined systolic and diastolic CHF, NYHA class 2    Osteoporosis 05/05/2020   Foot infection    NICM (nonischemic cardiomyopathy) (HCC), no significant CAD on cardiac cath   02/12/2020   Right foot pain    Rheumatoid arthritis    Atrial flutter 02/02/2020   Lower extremity edema 12/30/2019   Weight loss, non-intentional 11/24/2019   Chronic right hip pain 06/19/2019   Degenerative tear of acetabular labrum of right hip 05/02/2019   Primary osteoarthritis of right hip 05/02/2019   Skin ulcer 01/29/2019   Malignant neoplasm of prostate 01/16/2018   Insomnia 10/02/2017   S/P bilateral TKA 09/13/2015   Other bilateral secondary osteoarthritis of knee 07/14/2014   Heme positive stool 05/21/2013   Osteoarthritis, multiple sites 01/13/2011   Hypertension 12/06/2010   CLAUSTROPHOBIA 03/01/2010   Past Medical History:  Diagnosis Date   Anemia    Atrial flutter (HCC) 01/2020   Cancer (HCC)    prostate   COVID 2021   mild case   Dysrhythmia    Aflutter   Heart failure with reduced ejection fraction (HCC)    Hypertension    Hypoglycemia    occ   NICM (nonischemic cardiomyopathy) (HCC) 02/12/2020   Prostate cancer (HCC) 2019   Rheumatoid arthritis (HCC)     Family History  Problem Relation Age of Onset   Prostate cancer Father    Prostate cancer Brother    Colon cancer Neg Hx    Rectal cancer Neg Hx    Stomach cancer Neg Hx      Past Surgical History:  Procedure Laterality Date   AMPUTATION Right 02/10/2022   Procedure: RIGHT TRANSMETATARSAL AMPUTATION;  Surgeon: Nadara Mustard, MD;  Location: [facility];  Service: Orthopedics;  Laterality: Right;   AMPUTATION Right 03/10/2022   Procedure: RIGHT FOOT CHOPART AMPUTATION;  Surgeon: Nadara Mustard, MD;  Location: [facility] OR;  Service:  Orthopedics;  Laterality: Right;   AMPUTATION TOE Right 03/26/2020   Procedure: Right 3rd toe partial amputation, bone biopsy right 1st metatarsal;  Surgeon: Park Liter, DPM;  [facility];  Service: Podiatry;  Laterality: Right;   BUBBLE STUDY  02/11/2020   Procedure: BUBBLE STUDY;  Surgeon: Sande Rives, MD;  Location: [facility] ENDOSCOPY;  Service: Cardiovascular;;   CARDIOVERSION N/A 02/11/2020   Procedure: CARDIOVERSION;  Surgeon: Sande Rives, MD;  Location: [facility] ENDOSCOPY;  Service: Cardiovascular;  Laterality: N/A;   CARDIOVERSION N/A 08/31/2021   Procedure: CARDIOVERSION;  Surgeon: Jake Bathe, MD;  Location: [facility] ENDOSCOPY;  Service: Cardiovascular;  Laterality: N/A;   CYSTOSCOPY  04/11/2018   Procedure: Derinda Late;  Surgeon: Crist Fat, MD;  Location: [facility];  Service: Urology;;  NO SEEDS FOUND IN BLADDER   HERNIA REPAIR  2009   inguinal   INTRAMEDULLARY (IM) NAIL INTERTROCHANTERIC Right 05/05/2020   Procedure: CPT 27245-Cephalomedullary nailing of right intertrochanteric femur fracture;  Surgeon: Roby Lofts, MD;  Location: [facility];  Service: Orthopedics;  Laterality: Right;   INTRAMEDULLARY (IM) NAIL INTERTROCHANTERIC Right 12/24/2020   Procedure: REVISION FIXATION OF RIGHT INTERTROCHANTERIC FEMUR NONUNION;  Surgeon: Roby Lofts, MD;  Location: [facility];  Service: Orthopedics;  Laterality: Right;   IRRIGATION AND DEBRIDEMENT FOOT Left 03/26/2020   Procedure: Left foot incision and drainage with removal of all non-viable soft tissue and bone - areas overlying the 2nd and  5th metatarsals.;  Surgeon: Park Liter, DPM;  [facility];  Service: Podiatry;  Laterality: Left;   RADIOACTIVE SEED IMPLANT N/A 04/11/2018   Procedure: RADIOACTIVE SEED IMPLANT/BRACHYTHERAPY IMPLANT;  Surgeon: Crist Fat, MD;  Location: [facility];  Service: Urology;  Laterality: N/A;   69     SEEDS IMPLANTED   RIGHT/LEFT HEART CATH AND CORONARY ANGIOGRAPHY N/A 02/09/2020   Procedure: RIGHT/LEFT HEART CATH AND CORONARY ANGIOGRAPHY;  Surgeon: Yvonne Kendall, MD;  [facility];  Service: Cardiovascular;  Laterality: N/A;   SPACE OAR INSTILLATION N/A 04/11/2018   Procedure: SPACE OAR INSTILLATION;  Surgeon: Crist Fat, MD;  [facility];  Service: Urology;  Laterality: N/A;   TEE WITHOUT CARDIOVERSION N/A 02/11/2020   Procedure: TRANSESOPHAGEAL ECHOCARDIOGRAM (TEE);  Surgeon: Sande Rives, MD;  Location: [facility] ENDOSCOPY;  Service: Cardiovascular;  Laterality: N/A;   TOTAL KNEE ARTHROPLASTY Bilateral 09/13/2015   Procedure: BILATERAL KNEE ARTHROPLASTY ;  Surgeon: Durene Romans, MD;  [facility];  Service: Orthopedics;  Laterality: Bilateral;   Social History   Occupational History   Not on file  Tobacco Use   Smoking status: Never   Smokeless tobacco: Never  Vaping Use   Vaping Use: Never used  Substance and Sexual Activity   Alcohol use: Yes    Comment: 2 beers a week (occasionally)   Drug use: Not Currently   Sexual activity: Yes

## 2022-08-09 ENCOUNTER — Encounter: Payer: Self-pay | Admitting: Family

## 2022-08-09 NOTE — Telephone Encounter (Signed)
Patient request a call from Denny Peon to discuss the surgery she mention during his visit on yesterday 08/08/22.

## 2022-08-10 ENCOUNTER — Other Ambulatory Visit (HOSPITAL_COMMUNITY): Payer: Self-pay | Admitting: Student

## 2022-08-10 DIAGNOSIS — S72141D Displaced intertrochanteric fracture of right femur, subsequent encounter for closed fracture with routine healing: Secondary | ICD-10-CM

## 2022-08-11 ENCOUNTER — Other Ambulatory Visit: Payer: Self-pay | Admitting: Urology

## 2022-08-11 ENCOUNTER — Other Ambulatory Visit: Payer: Self-pay | Admitting: Orthopedic Surgery

## 2022-08-11 ENCOUNTER — Other Ambulatory Visit: Payer: Self-pay | Admitting: Family

## 2022-08-14 ENCOUNTER — Ambulatory Visit: Payer: Medicare Other | Admitting: Orthopedic Surgery

## 2022-08-14 ENCOUNTER — Telehealth: Payer: Self-pay | Admitting: Orthopedic Surgery

## 2022-08-14 DIAGNOSIS — M869 Osteomyelitis, unspecified: Secondary | ICD-10-CM

## 2022-08-14 NOTE — Telephone Encounter (Signed)
Patient advising his infection is getting worse and he is concerned. Sending call to Triage.Marland Kitchen

## 2022-08-14 NOTE — Telephone Encounter (Signed)
Pt has an appt this afternoon to discuss with Dr. Lajoyce Corners.

## 2022-08-15 ENCOUNTER — Encounter (HOSPITAL_COMMUNITY): Payer: Self-pay | Admitting: Orthopedic Surgery

## 2022-08-15 ENCOUNTER — Encounter: Payer: Self-pay | Admitting: Orthopedic Surgery

## 2022-08-15 ENCOUNTER — Other Ambulatory Visit: Payer: Self-pay

## 2022-08-15 NOTE — Progress Notes (Signed)
Patient presents with concerns for the left foot third toe.  Patient is scheduled for amputation on Wednesday.  He states the toe is getting worse.  Examination there is some maceration there is a small open area over the second toe which appears to be more of an abrasion.  There is no swelling no tenderness in the second toe.  Patient has a palpable dorsalis pedis pulse.  Plan for outpatient surgery Wednesday.

## 2022-08-15 NOTE — Progress Notes (Signed)
Anesthesia Chart Review: Thomas [name]  Case: [id number] Date/Time: 08/16/22 0715   Procedure: LEFT 3RD TOE AMPUTATION (Left)   Anesthesia type: Choice   Pre-op diagnosis: Osteomyelitis Left 3rd Toe   [facility] ROOM 04 / [facility]   Surgeons: Nadara Mustard, MD       DISCUSSION: Patient is a 72 year old male scheduled for the above procedure. Case added after 08/14/22 evaluation of worsening left 3rd toe wound.   History includes never smoker, HTN, atrial flutter, (s/p DCCV 02/11/20, 08/31/21), nonischemic cardiomyopathy (no significant CAD 01/2020), CHF (EF < 20% 01/2020, 55-60% 08/2020 & 10/2021), RA, prostate cancer (s/p radioactive seed implant 04/11/18), TKA (left 09/13/15), osteomyelitis (s/p right 3rd toe amputation; right TMA 02/10/22, right foot Chopart amputation 03/10/22), right femur fracture (s/p cephalomedullary nailing 05/04/20; removal of right femur hardware, revision fixation for femoral non-union 12/24/20), hernia (right IHR 12/07/20).    Follows with cardiologist Dr. Shari Prows for history of NICM with reduced EF (now recovered, 55 to 60% by echo 10/2021), HTN, recurrent atrial flutter s/p multiple DCCV.  Last visit on 06/30/22 with Robin Searing, NP for routine 3 month follow-up.He was felt to be doing well from a CV standpoint. Euvolemic on exam. Maintaining SR. No new testing ordered. Follow-up in 6 months planned. He is no longer taking amiodarone per his preference. He is on Toprol and Eliquis.   Anesthesia team to evaluate on the day of surgery. Defer perioperative Eliquis instructions to surgeon--he was given permission by cardiology to temporarily hold for his previous surgery.    VS: Ht  (2.032 m)   Wt 99.8 kg   BMI 24.17 kg/m  BP Readings from Last 3 Encounters:  06/30/22 108/60  06/20/22 136/73  03/31/22 128/78   Pulse Readings from Last 3 Encounters:  06/30/22 67  06/20/22 64  03/31/22 91    PROVIDERS: Carney Living, MD is PCP  Laurance Flatten,  MD is Cardiologist  Alben Deeds, MD is rheumatologist   LABS: For day of surgery as indicated. Last results in [facility] include: Lab Results  Component Value Date   WBC 5.1 03/10/2022   HGB 11.2 (L) 03/10/2022   HCT 37.4 (L) 03/10/2022   PLT 370 03/10/2022   GLUCOSE 82 06/30/2022   ALT 15 06/30/2022   AST 19 06/30/2022   NA 138 06/30/2022   K 4.3 06/30/2022   CL 101 06/30/2022   CREATININE 0.97 06/30/2022   BUN 17 06/30/2022   CO2 24 06/30/2022   TSH 6.260 (H) 06/30/2022    EKG: 10/04/2021: Sinus rhythm.  Rate 64.    CV: Echo 11/10/2021  1. Limited echo for LV function   2. Left ventricular ejection fraction, by estimation, is 55 to 60%. Left  ventricular ejection fraction by 3D volume is 56 %. The left ventricle has  normal function. The left ventricle has no regional wall motion  abnormalities. There is mild left  ventricular hypertrophy. Left ventricular diastolic parameters are  consistent with Grade I diastolic dysfunction (impaired relaxation).   3. The aortic valve is tricuspid. Aortic valve regurgitation is not  visualized.   4. Aortic dilatation noted. There is mild dilatation of the aortic root,  measuring 41 mm.  (Comparison LVEF < 20% 02/02/20; 10-15% 02/11/20; 45-50% 05/06/20; LVEF 55-60% 08/27/20, mild LVH, grade 1 DD, normal RVSF, normal PASP, trivial MR/AR, aortic root 41 mm)     Cardiac Cath 02/09/2020 Conclusions: No angiographically significant coronary artery disease. Upper normal left heart, right heart, and  pulmonary artery pressures. Mildly reduced Fick cardiac output/index.   Recommendations: Restart heparin infusion 2 hours after TR band removal.  If there is no evidence of bleeding/vascular complication, consider transitioning back to apixaban as soon as tomorrow. Proceed with TEE/cardioversion as soon as tomorrow.  I will make Thomas [name] NPO after midnight in anticipation of this. Optimize evidence-based heart failure therapy for non-ischemic  cardiomyopathy. Primary prevention of coronary artery disease.   Past Medical History:  Diagnosis Date   Anemia    Atrial flutter 01/2020   Cancer    prostate   COVID 2021   mild case   Dysrhythmia    Aflutter   Heart failure with reduced ejection fraction    Hypertension    Hypoglycemia    occ   NICM (nonischemic cardiomyopathy) 02/12/2020   Prostate cancer 2019   Rheumatoid arthritis     Past Surgical History:  Procedure Laterality Date   AMPUTATION Right 02/10/2022   Procedure: RIGHT TRANSMETATARSAL AMPUTATION;  Surgeon: Nadara Mustard, MD;  Location: [facility] OR;  Service: Orthopedics;  Laterality: Right;   AMPUTATION Right 03/10/2022   Procedure: RIGHT FOOT CHOPART AMPUTATION;  Surgeon: Nadara Mustard, MD;  Location: [facility] OR;  Service: Orthopedics;  Laterality: Right;   AMPUTATION TOE Right 03/26/2020   Procedure: Right 3rd toe partial amputation, bone biopsy right 1st metatarsal;  Surgeon: Park Liter, DPM;  Location: [facility] ORS;  Service: Podiatry;  Laterality: Right;   BUBBLE STUDY  02/11/2020   Procedure: BUBBLE STUDY;  Surgeon: Sande Rives, MD;  Location: [facility] ENDOSCOPY;  Service: Cardiovascular;;   CARDIOVERSION N/A 02/11/2020   Procedure: CARDIOVERSION;  Surgeon: Sande Rives, MD;  Location: [facility] ENDOSCOPY;  Service: Cardiovascular;  Laterality: N/A;   CARDIOVERSION N/A 08/31/2021   Procedure: CARDIOVERSION;  Surgeon: Jake Bathe, MD;  Location: [facility] ENDOSCOPY;  Service: Cardiovascular;  Laterality: N/A;   CYSTOSCOPY  04/11/2018   Procedure: Derinda Late;  Surgeon: Crist Fat, MD;  Location: [facility];  Service: Urology;;  NO SEEDS FOUND IN BLADDER   HERNIA REPAIR  2009   inguinal   INTRAMEDULLARY (IM) NAIL INTERTROCHANTERIC Right 05/05/2020   Procedure: CPT 27245-Cephalomedullary nailing of right intertrochanteric femur fracture;  Surgeon: Roby Lofts, MD;  [facility];  Service: Orthopedics;  Laterality: Right;    INTRAMEDULLARY (IM) NAIL INTERTROCHANTERIC Right 12/24/2020   Procedure: REVISION FIXATION OF RIGHT INTERTROCHANTERIC FEMUR NONUNION;  Surgeon: Roby Lofts, MD;  [facility];  Service: Orthopedics;  Laterality: Right;   IRRIGATION AND DEBRIDEMENT FOOT Left 03/26/2020   Procedure: Left foot incision and drainage with removal of all non-viable soft tissue and bone - areas overlying the 2nd and 5th metatarsals.;  Surgeon: Park Liter, DPM;  Location[facility]S;  Service: Podiatry;  Laterality: Left;   RADIOACTIVE SEED IMPLANT N/A 04/11/2018   Procedure: RADIOACTIVE SEED IMPLANT/BRACHYTHERAPY IMPLANT;  Surgeon: Crist Fat, MD;  Location: [facility];  Service: Urology;  Laterality: N/A;   71     SEEDS IMPLANTED   RIGHT/LEFT HEART CATH AND CORONARY ANGIOGRAPHY N/A 02/09/2020   Procedure: RIGHT/LEFT HEART CATH AND CORONARY ANGIOGRAPHY;  Surgeon: Yvonne Kendall, MD;  [facility] INVASIVE CV LAB;  Service: Cardiovascular;  Laterality: N/A;   SPACE OAR INSTILLATION N/A 04/11/2018   Procedure: SPACE OAR INSTILLATION;  Surgeon: Crist Fat, MD;  Location: [facility];  Service: Urology;  Laterality: N/A;   TEE WITHOUT CARDIOVERSION N/A 02/11/2020   Procedure: TRANSESOPHAGEAL  ECHOCARDIOGRAM (TEE);  Surgeon: Sande Rives, MD;  Location: [facility] ENDOSCOPY;  Service: Cardiovascular;  Laterality: N/A;   TOTAL KNEE ARTHROPLASTY Bilateral 09/13/2015   Procedure: BILATERAL KNEE ARTHROPLASTY ;  Surgeon: Durene Romans, MD;  Location: [facility]S;  Service: Orthopedics;  Laterality: Bilateral;    MEDICATIONS: No current facility-administered medications for this encounter.    acetaminophen (TYLENOL) 650 MG CR tablet   apixaban (ELIQUIS) 5 MG TABS tablet   atorvastatin (LIPITOR) 40 MG tablet   diazepam (VALIUM) 5 MG tablet   DULoxetine (CYMBALTA) 60 MG capsule   lisinopril (ZESTRIL) 10 MG tablet   metoprolol succinate (TOPROL XL) 25 MG 24 hr tablet    NARCAN 4 MG/0.1ML LIQD nasal spray kit   oxycodone (ROXICODONE) 30 MG immediate release tablet   silver sulfADIAZINE (SILVADENE) 1 % cream   sulfamethoxazole-trimethoprim (BACTRIM DS) 800-160 MG tablet   tamsulosin (FLOMAX) 0.4 MG CAPS capsule   testosterone cypionate (DEPOTESTOSTERONE CYPIONATE) 200 MG/ML injection   torsemide (DEMADEX) 20 MG tablet   amiodarone (PACERONE) 100 MG tablet    Shonna Chock, PA-C Surgical Short Stay/Anesthesiology [facility] Phone [phone] [facility] Phone [phone] 08/15/2022 12:20 PM

## 2022-08-15 NOTE — Anesthesia Preprocedure Evaluation (Signed)
Anesthesia Evaluation  Patient identified by MRN, date of birth, ID band Patient awake    Reviewed: Allergy & Precautions, H&P , NPO status , Patient's Chart, lab work & pertinent test results  Airway Mallampati: II  TM Distance: >3 FB Neck ROM: Full    Dental no notable dental hx.    Pulmonary neg pulmonary ROS   Pulmonary exam normal breath sounds clear to auscultation       Cardiovascular hypertension, Pt. on medications +CHF  Normal cardiovascular exam+ dysrhythmias Atrial Fibrillation  Rhythm:Regular Rate:Normal     Neuro/Psych   Anxiety     negative neurological ROS  negative psych ROS   GI/Hepatic negative GI ROS, Neg liver ROS,,,  Endo/Other  negative endocrine ROS    Renal/GU negative Renal ROS  negative genitourinary   Musculoskeletal  (+) Arthritis , Osteoarthritis,    Abdominal   Peds negative pediatric ROS (+)  Hematology  (+) Blood dyscrasia, anemia   Anesthesia Other Findings   Reproductive/Obstetrics negative OB ROS                             Anesthesia Physical Anesthesia Plan  ASA: 3  Anesthesia Plan: General   Post-op Pain Management: Minimal or no pain anticipated   Induction: Intravenous  PONV Risk Score and Plan: 2 and Ondansetron, Midazolam and Treatment may vary due to age or medical condition  Airway Management Planned: LMA  Additional Equipment:   Intra-op Plan:   Post-operative Plan: Extubation in OR  Informed Consent: I have reviewed the patients History and Physical, chart, labs and discussed the procedure including the risks, benefits and alternatives for the proposed anesthesia with the patient or authorized representative who has indicated his/her understanding and acceptance.     Dental advisory given  Plan Discussed with: CRNA  Anesthesia Plan Comments: (PAT note written 08/15/2022 by Shonna Chock, PA-C.  )       Anesthesia  Quick Evaluation

## 2022-08-15 NOTE — Progress Notes (Signed)
Thomas [name] denies chest pain or shortness of breath.  Patient denies having any s/s of Covid in his household, also denies any known exposure to Covid. . Thomas [name]  denies  any s/s of upper or lower respiratory in the past 8 weeks.   PCP is Dr. Romilda Garret, cardiologist is Dr. Laurance Flatten.

## 2022-08-16 ENCOUNTER — Encounter (HOSPITAL_COMMUNITY): Payer: Self-pay | Admitting: Orthopedic Surgery

## 2022-08-16 ENCOUNTER — Ambulatory Visit (HOSPITAL_BASED_OUTPATIENT_CLINIC_OR_DEPARTMENT_OTHER): Payer: Medicare Other | Admitting: Vascular Surgery

## 2022-08-16 ENCOUNTER — Encounter (HOSPITAL_COMMUNITY)
Admission: RE | Disposition: A | Discharge status: Home / Self Care | Payer: Self-pay | Source: Home / Self Care | Attending: Orthopedic Surgery

## 2022-08-16 ENCOUNTER — Ambulatory Visit (HOSPITAL_COMMUNITY): Payer: Medicare Other | Admitting: Vascular Surgery

## 2022-08-16 ENCOUNTER — Ambulatory Visit (HOSPITAL_COMMUNITY)
Admission: RE | Admit: 2022-08-16 | Discharge: 2022-08-16 | Disposition: A | Discharge status: Home / Self Care | Payer: Medicare Other | Attending: Orthopedic Surgery | Admitting: Orthopedic Surgery

## 2022-08-16 ENCOUNTER — Other Ambulatory Visit: Payer: Self-pay

## 2022-08-16 DIAGNOSIS — I4891 Unspecified atrial fibrillation: Secondary | ICD-10-CM | POA: Diagnosis not present

## 2022-08-16 DIAGNOSIS — D649 Anemia, unspecified: Secondary | ICD-10-CM | POA: Insufficient documentation

## 2022-08-16 DIAGNOSIS — Z7901 Long term (current) use of anticoagulants: Secondary | ICD-10-CM | POA: Insufficient documentation

## 2022-08-16 DIAGNOSIS — I110 Hypertensive heart disease with heart failure: Secondary | ICD-10-CM

## 2022-08-16 DIAGNOSIS — M869 Osteomyelitis, unspecified: Secondary | ICD-10-CM

## 2022-08-16 DIAGNOSIS — Z8546 Personal history of malignant neoplasm of prostate: Secondary | ICD-10-CM | POA: Diagnosis not present

## 2022-08-16 DIAGNOSIS — I509 Heart failure, unspecified: Secondary | ICD-10-CM | POA: Insufficient documentation

## 2022-08-16 DIAGNOSIS — M069 Rheumatoid arthritis, unspecified: Secondary | ICD-10-CM | POA: Insufficient documentation

## 2022-08-16 DIAGNOSIS — M1990 Unspecified osteoarthritis, unspecified site: Secondary | ICD-10-CM | POA: Insufficient documentation

## 2022-08-16 DIAGNOSIS — F419 Anxiety disorder, unspecified: Secondary | ICD-10-CM | POA: Insufficient documentation

## 2022-08-16 HISTORY — DX: Cardiac murmur, unspecified: R01.1

## 2022-08-16 HISTORY — PX: AMPUTATION: SHX166

## 2022-08-16 HISTORY — DX: Osteomyelitis, unspecified: M86.9

## 2022-08-16 LAB — BASIC METABOLIC PANEL
Anion gap: 12 (ref 5–15)
BUN: 19 mg/dL (ref 8–23)
CO2: 23 mmol/L (ref 22–32)
Calcium: 8.6 mg/dL — ABNORMAL LOW (ref 8.9–10.3)
Chloride: 101 mmol/L (ref 98–111)
Creatinine, Ser: 1.02 mg/dL (ref 0.61–1.24)
GFR, Estimated: >60 mL/min (ref >60)
Glucose, Bld: 94 mg/dL (ref 70–99)
Potassium: 4.4 mmol/L (ref 3.5–5.1)
Sodium: 136 mmol/L (ref 135–145)

## 2022-08-16 LAB — CBC
HCT: 34.9 % — ABNORMAL LOW (ref 39.0–52.0)
Hemoglobin: 10.9 g/dL — ABNORMAL LOW (ref 13.0–17.0)
MCH: 25.4 pg — ABNORMAL LOW (ref 26.0–34.0)
MCHC: 31.2 g/dL (ref 30.0–36.0)
MCV: 81.4 fL (ref 80.0–100.0)
Platelets: 249 10*3/uL (ref 150–400)
RBC: 4.29 MIL/uL (ref 4.22–5.81)
RDW: 17.5 % — ABNORMAL HIGH (ref 11.5–15.5)
WBC: 4 10*3/uL (ref 4.0–10.5)
nRBC: 0 % (ref 0.0–0.2)

## 2022-08-16 SURGERY — AMPUTATION DIGIT
Anesthesia: General | Laterality: Left

## 2022-08-16 MED ORDER — PROPOFOL 10 MG/ML IV BOLUS
INTRAVENOUS | Status: AC
  Filled 2022-08-16: qty 20

## 2022-08-16 MED ORDER — CHLORHEXIDINE GLUCONATE 0.12 % MT SOLN
OROMUCOSAL | Status: AC
  Filled 2022-08-16: qty 15

## 2022-08-16 MED ORDER — HYDROMORPHONE HCL 1 MG/ML IJ SOLN: 0.2500 mg | INTRAMUSCULAR | Status: DC | PRN

## 2022-08-16 MED ORDER — VANCOMYCIN HCL IN DEXTROSE 1-5 GM/200ML-% IV SOLN: 1000.0000 mg | INTRAVENOUS | Status: AC

## 2022-08-16 MED ORDER — LIDOCAINE HCL (PF) 1 % IJ SOLN
INTRAMUSCULAR | Status: AC
  Filled 2022-08-16: qty 30

## 2022-08-16 MED ORDER — AMISULPRIDE (ANTIEMETIC) 5 MG/2ML IV SOLN: 10.0000 mg | Freq: Once | INTRAVENOUS | Status: DC | PRN

## 2022-08-16 MED ORDER — LACTATED RINGERS IV SOLN: INTRAVENOUS | Status: DC

## 2022-08-16 MED ORDER — OXYCODONE HCL 5 MG PO TABS
5.0000 mg | ORAL_TABLET | Freq: Once | ORAL | Status: AC | PRN
  Administered 2022-08-16: 5 mg via ORAL

## 2022-08-16 MED ORDER — LIDOCAINE HCL (CARDIAC) PF 100 MG/5ML IV SOSY
PREFILLED_SYRINGE | INTRAVENOUS | Status: DC | PRN
  Administered 2022-08-16: 20 mg via INTRAVENOUS

## 2022-08-16 MED ORDER — OXYCODONE HCL 5 MG PO TABS
ORAL_TABLET | ORAL | Status: AC
  Filled 2022-08-16: qty 1

## 2022-08-16 MED ORDER — OXYCODONE HCL 30 MG PO TABS: 30.0000 mg | ORAL_TABLET | ORAL | 0 refills | Status: DC | PRN

## 2022-08-16 MED ORDER — ONDANSETRON HCL 4 MG/2ML IJ SOLN
INTRAMUSCULAR | Status: AC
  Filled 2022-08-16: qty 2

## 2022-08-16 MED ORDER — OXYCODONE HCL 5 MG/5ML PO SOLN: 5.0000 mg | Freq: Once | ORAL | Status: AC | PRN

## 2022-08-16 MED ORDER — LIDOCAINE HCL (PF) 1 % IJ SOLN
INTRAMUSCULAR | Status: DC | PRN
  Administered 2022-08-16: 16 mL

## 2022-08-16 MED ORDER — MEPERIDINE HCL 25 MG/ML IJ SOLN: 6.2500 mg | INTRAMUSCULAR | Status: DC | PRN

## 2022-08-16 MED ORDER — LEVOFLOXACIN IN D5W 500 MG/100ML IV SOLN
INTRAVENOUS | Status: AC
  Filled 2022-08-16: qty 100

## 2022-08-16 MED ORDER — LEVOFLOXACIN IN D5W 500 MG/100ML IV SOLN
500.0000 mg | INTRAVENOUS | Status: AC
  Administered 2022-08-16: 500 mg via INTRAVENOUS

## 2022-08-16 MED ORDER — LIDOCAINE 2% (20 MG/ML) 5 ML SYRINGE
INTRAMUSCULAR | Status: AC
  Filled 2022-08-16: qty 5

## 2022-08-16 MED ORDER — FENTANYL CITRATE (PF) 100 MCG/2ML IJ SOLN
INTRAMUSCULAR | Status: DC | PRN
  Administered 2022-08-16: 50 ug via INTRAVENOUS

## 2022-08-16 MED ORDER — ORAL CARE MOUTH RINSE: 15.0000 mL | Freq: Once | OROMUCOSAL | Status: AC

## 2022-08-16 MED ORDER — PROMETHAZINE HCL 25 MG/ML IJ SOLN: 6.2500 mg | INTRAMUSCULAR | Status: DC | PRN

## 2022-08-16 MED ORDER — VANCOMYCIN HCL IN DEXTROSE 1-5 GM/200ML-% IV SOLN
INTRAVENOUS | Status: AC
  Administered 2022-08-16: 1000 mg via INTRAVENOUS
  Filled 2022-08-16: qty 200

## 2022-08-16 MED ORDER — MIDAZOLAM HCL 5 MG/5ML IJ SOLN
INTRAMUSCULAR | Status: DC | PRN
  Administered 2022-08-16: 2 mg via INTRAVENOUS

## 2022-08-16 MED ORDER — 0.9 % SODIUM CHLORIDE (POUR BTL) OPTIME
TOPICAL | Status: DC | PRN
  Administered 2022-08-16: 1000 mL

## 2022-08-16 MED ORDER — CHLORHEXIDINE GLUCONATE 0.12 % MT SOLN
15.0000 mL | Freq: Once | OROMUCOSAL | Status: AC
  Administered 2022-08-16: 15 mL via OROMUCOSAL

## 2022-08-16 MED ORDER — ONDANSETRON HCL 4 MG/2ML IJ SOLN
INTRAMUSCULAR | Status: DC | PRN
  Administered 2022-08-16: 4 mg via INTRAVENOUS

## 2022-08-16 MED ORDER — FENTANYL CITRATE (PF) 250 MCG/5ML IJ SOLN
INTRAMUSCULAR | Status: AC
  Filled 2022-08-16: qty 5

## 2022-08-16 MED ORDER — PROPOFOL 10 MG/ML IV BOLUS
INTRAVENOUS | Status: DC | PRN
  Administered 2022-08-16: 20 mg via INTRAVENOUS
  Administered 2022-08-16 (×3): 10 mg via INTRAVENOUS
  Administered 2022-08-16: 20 mg via INTRAVENOUS
  Administered 2022-08-16 (×4): 10 mg via INTRAVENOUS

## 2022-08-16 MED ORDER — MIDAZOLAM HCL 2 MG/2ML IJ SOLN
INTRAMUSCULAR | Status: AC
  Filled 2022-08-16: qty 2

## 2022-08-16 SURGICAL SUPPLY — 33 items
BAG COUNTER SPONGE SURGICOUNT (BAG) ×1 IMPLANT
BAG SPNG CNTER NS LX DISP (BAG) ×1
BLADE SURG 21 STRL SS (BLADE) ×1 IMPLANT
BNDG CMPR 9X4 STRL LF SNTH (GAUZE/BANDAGES/DRESSINGS)
BNDG COHESIVE 4X5 TAN STRL (GAUZE/BANDAGES/DRESSINGS) ×1 IMPLANT
BNDG COHESIVE 6X5 TAN NS LF (GAUZE/BANDAGES/DRESSINGS) IMPLANT
BNDG ESMARK 4X9 LF (GAUZE/BANDAGES/DRESSINGS) IMPLANT
BNDG GAUZE DERMACEA FLUFF 4 (GAUZE/BANDAGES/DRESSINGS) ×1 IMPLANT
BNDG GZE DERMACEA 4 6PLY (GAUZE/BANDAGES/DRESSINGS) ×1
COVER SURGICAL LIGHT HANDLE (MISCELLANEOUS) ×2 IMPLANT
DRAPE U-SHAPE 47X51 STRL (DRAPES) ×1 IMPLANT
DRSG ADAPTIC 3X8 NADH LF (GAUZE/BANDAGES/DRESSINGS) IMPLANT
DURAPREP 26ML APPLICATOR (WOUND CARE) ×1 IMPLANT
ELECT REM PT RETURN 9FT ADLT (ELECTROSURGICAL) ×1
ELECTRODE REM PT RTRN 9FT ADLT (ELECTROSURGICAL) ×1 IMPLANT
GAUZE PAD ABD 8X10 STRL (GAUZE/BANDAGES/DRESSINGS) ×1 IMPLANT
GAUZE SPONGE 4X4 12PLY STRL (GAUZE/BANDAGES/DRESSINGS) IMPLANT
GLOVE BIOGEL PI IND STRL 9 (GLOVE) ×1 IMPLANT
GLOVE SURG ORTHO 9.0 STRL STRW (GLOVE) ×1 IMPLANT
GOWN STRL REUS W/ TWL XL LVL3 (GOWN DISPOSABLE) ×2 IMPLANT
GOWN STRL REUS W/TWL XL LVL3 (GOWN DISPOSABLE) ×2
GRAFT SKIN WND MICRO 38 (Tissue) IMPLANT
KIT BASIN OR (CUSTOM PROCEDURE TRAY) ×1 IMPLANT
KIT TURNOVER KIT B (KITS) ×1 IMPLANT
MANIFOLD NEPTUNE II (INSTRUMENTS) ×1 IMPLANT
NDL 22X1.5 STRL (OR ONLY) (MISCELLANEOUS) IMPLANT
NEEDLE 22X1.5 STRL (OR ONLY) (MISCELLANEOUS) IMPLANT
NS IRRIG 1000ML POUR BTL (IV SOLUTION) ×1 IMPLANT
PACK ORTHO EXTREMITY (CUSTOM PROCEDURE TRAY) ×1 IMPLANT
PAD ARMBOARD 7.5X6 YLW CONV (MISCELLANEOUS) ×2 IMPLANT
SUT ETHILON 2 0 PSLX (SUTURE) ×1 IMPLANT
SYR CONTROL 10ML LL (SYRINGE) IMPLANT
TOWEL GREEN STERILE (TOWEL DISPOSABLE) ×1 IMPLANT

## 2022-08-16 NOTE — Op Note (Signed)
08/16/2022  8:12 AM  PATIENT:  Thomas [name]    PRE-OPERATIVE DIAGNOSIS:  Osteomyelitis Left 3rd Toe  POST-OPERATIVE DIAGNOSIS:  Same  PROCEDURE:  LEFT 3RD RAY AMPUTATION Application Kerecis micro powder 38 cm.    SURGEON:  Nadara Mustard, MD  PHYSICIAN ASSISTANT:None ANESTHESIA:   General  PREOPERATIVE INDICATIONS:  Thomas [name] is a  72 y.o. male with a diagnosis of Osteomyelitis Left 3rd Toe who failed conservative measures and elected for surgical management.    The risks benefits and alternatives were discussed with the patient preoperatively including but not limited to the risks of infection, bleeding, nerve injury, cardiopulmonary complications, the need for revision surgery, among others, and the patient was willing to proceed.  OPERATIVE IMPLANTS:   Implant Name Type Inv. Item Serial No. Manufacturer Lot No. LRB No. Used Action  Kerecis Graft Skin 38 sqcm    [employer] [id number] Left 1 Implanted    @  OPERATIVE FINDINGS: Good petechial bleeding no signs of infection at the level of amputation.  OPERATIVE PROCEDURE: Patient was brought the operating room underwent a MAC anesthetic.  After adequate levels anesthesia were obtained patient's left lower extremities prepped using DuraPrep draped into a sterile field a timeout was called.  A regional block was performed with 17 cc of 1% lidocaine plain.  IV incision was made around the third ray the third ray was amputated through the metatarsal shaft.  This was resected in 1 block of tissue.  The wound was irrigated with normal saline electrocautery was used hemostasis.  The wound was filled with 38 cm of Kerecis micro powder.  A small amount of powder was also placed on the ulcer on the second toe as well as the ulcer beneath the first metatarsal head.  This was covered with sterile dressing patient was taken the PACU in stable condition.  The wound was closed using 2-0 nylon.   DISCHARGE  PLANNING:  Antibiotic duration: Preoperative antibiotics  Weightbearing: Touchdown weightbearing on the left  Pain medication: Prescriptions provided for his current dose of oxycodone 30 mg.  Dressing care/ Wound VAC: Dry dressing  Ambulatory devices: Crutches postoperative shoe  Discharge to: Home.  Follow-up: In the office 1 week post operative.

## 2022-08-16 NOTE — Anesthesia Postprocedure Evaluation (Signed)
Anesthesia Post Note  Patient: Thomas [name]  Procedure(s) Performed: LEFT 3RD TOE AMPUTATION (Left)     Patient location during evaluation: PACU Anesthesia Type: MAC Level of consciousness: awake and alert Pain management: pain level controlled Vital Signs Assessment: post-procedure vital signs reviewed and stable Respiratory status: spontaneous breathing, nonlabored ventilation and respiratory function stable Cardiovascular status: blood pressure returned to baseline and stable Postop Assessment: no apparent nausea or vomiting Anesthetic complications: no   No notable events documented.  Last Vitals:  Vitals:   08/16/22 0628 08/16/22 0815  BP: 98/68 120/76  Pulse: (!) 55 (!) 53  Resp: 16 10  Temp: 36.6 C (!) 36.2 C  SpO2:  100%    Last Pain:  Vitals:   08/16/22 0805  TempSrc:   PainSc: Asleep                 Lowella Curb

## 2022-08-16 NOTE — H&P (Signed)
Thomas [name] is an 72 y.o. male.   Chief Complaint: Osteomyelitis left third toe HPI: The patient is a 72 year old gentleman who is seen today for 2 separate issues he is seen status post right Chopart amputation November 17 of last year this has been slow to heal. He is currently on doxycycline doing silver cell dressing changes to the right foot daily. He also reports a weeks to months history of ulcers on his left foot beneath the MTP joint of the great toe as well as to the tip of the third toe. The third toe has been having increased swelling and redness and warmth and pain. He has been having scant drainage denies fevers or chills   Past Medical History:  Diagnosis Date   Anemia    Atrial flutter 01/2020   Cancer    prostate   COVID 2021   mild case   Dysrhythmia    Aflutter   Heart failure with reduced ejection fraction    Pt denies, it is in cardiologist notes.   Heart murmur    Hypertension    Hypoglycemia    occ   NICM (nonischemic cardiomyopathy) 02/12/2020   Prostate cancer 2019   Rheumatoid arthritis     Past Surgical History:  Procedure Laterality Date   AMPUTATION Right 02/10/2022   Procedure: RIGHT TRANSMETATARSAL AMPUTATION;  Surgeon: Nadara Mustard, MD;  Location: [facility] OR;  Service: Orthopedics;  Laterality: Right;   AMPUTATION Right 03/10/2022   Procedure: RIGHT FOOT CHOPART AMPUTATION;  Surgeon: Nadara Mustard, MD;  Location: [facility] OR;  Service: Orthopedics;  Laterality: Right;   AMPUTATION TOE Right 03/26/2020   Procedure: Right 3rd toe partial amputation, bone biopsy right 1st metatarsal;  Surgeon: Park Liter, DPM;  [facility]S;  Service: Podiatry;  Laterality: Right;   BUBBLE STUDY  02/11/2020   Procedure: BUBBLE STUDY;  Surgeon: Sande Rives, MD;  Location: [facility] ENDOSCOPY;  Service: Cardiovascular;;   CARDIOVERSION N/A 02/11/2020   Procedure: CARDIOVERSION;  Surgeon: Sande Rives, MD;  Location: [facility] ENDOSCOPY;  Service:  Cardiovascular;  Laterality: N/A;   CARDIOVERSION N/A 08/31/2021   Procedure: CARDIOVERSION;  Surgeon: Jake Bathe, MD;  Location: [facility] ENDOSCOPY;  Service: Cardiovascular;  Laterality: N/A;   CYSTOSCOPY  04/11/2018   Procedure: Derinda Late;  Surgeon: Crist Fat, MD;  Location: [facility];  Service: Urology;;  NO SEEDS FOUND IN BLADDER   HERNIA REPAIR  2009   inguinal   INTRAMEDULLARY (IM) NAIL INTERTROCHANTERIC Right 05/05/2020   Procedure: CPT 27245-Cephalomedullary nailing of right intertrochanteric femur fracture;  Surgeon: Roby Lofts, MD;  [facility] OR;  Service: Orthopedics;  Laterality: Right;   INTRAMEDULLARY (IM) NAIL INTERTROCHANTERIC Right 12/24/2020   Procedure: REVISION FIXATION OF RIGHT INTERTROCHANTERIC FEMUR NONUNION;  Surgeon: Roby Lofts, MD;  Location[facility] OR;  Service: Orthopedics;  Laterality: Right;   IRRIGATION AND DEBRIDEMENT FOOT Left 03/26/2020   Procedure: Left foot incision and drainage with removal of all non-viable soft tissue and bone - areas overlying the 2nd and 5th metatarsals.;  Surgeon: Park Liter, DPM;  Location[facility]S;  Service: Podiatry;  Laterality: Left;   RADIOACTIVE SEED IMPLANT N/A 04/11/2018   Procedure: RADIOACTIVE SEED IMPLANT/BRACHYTHERAPY IMPLANT;  Surgeon: Crist Fat, MD;  Location: [facility];  Service: Urology;  Laterality: N/A;   71     SEEDS IMPLANTED   RIGHT/LEFT HEART CATH AND CORONARY ANGIOGRAPHY N/A 02/09/2020   Procedure: RIGHT/LEFT HEART CATH  AND CORONARY ANGIOGRAPHY;  Surgeon: Yvonne Kendall, MD;  [facility] INVASIVE CV LAB;  Service: Cardiovascular;  Laterality: N/A;   SPACE OAR INSTILLATION N/A 04/11/2018   Procedure: SPACE OAR INSTILLATION;  Surgeon: Crist Fat, MD;  Location: [facility];  Service: Urology;  Laterality: N/A;   TEE WITHOUT CARDIOVERSION N/A 02/11/2020   Procedure: TRANSESOPHAGEAL ECHOCARDIOGRAM (TEE);  Surgeon:  Sande Rives, MD;  Location: [facility] ENDOSCOPY;  Service: Cardiovascular;  Laterality: N/A;   TOTAL KNEE ARTHROPLASTY Bilateral 09/13/2015   Procedure: BILATERAL KNEE ARTHROPLASTY ;  Surgeon: Durene Romans, MD;  Location: [facility]S;  Service: Orthopedics;  Laterality: Bilateral;    Family History  Problem Relation Age of Onset   Prostate cancer Father    Prostate cancer Brother    Colon cancer Neg Hx    Rectal cancer Neg Hx    Stomach cancer Neg Hx    Social History:  reports that he has never smoked. He has never used smokeless tobacco. He reports current alcohol use of about 1.0 standard drink of alcohol per week. He reports that he does not currently use drugs.  Allergies:  Allergies  Allergen Reactions   Cefadroxil Hives and Palpitations    EMS to ED from [facility].   Diltiazem Hcl Swelling   Chlorthalidone Other (See Comments)    "Makes me light-headed and I don't like the way it makes me feel"   Voltaren [Diclofenac] Rash    Medications Prior to Admission  Medication Sig Dispense Refill   acetaminophen (TYLENOL) 650 MG CR tablet Take 1,300 mg by mouth 2 (two) times daily as needed for pain.     amiodarone (PACERONE) 100 MG tablet Take 1 tablet (100 mg total) by mouth daily. 90 tablet 1   apixaban (ELIQUIS) 5 MG TABS tablet Take 1 tablet (5 mg total) by mouth 2 (two) times daily. 180 tablet 1   atorvastatin (LIPITOR) 40 MG tablet Take 40 mg by mouth daily.     diazepam (VALIUM) 5 MG tablet 5 mg at bedtime as needed for muscle spasms (To relax prostate).     DULoxetine (CYMBALTA) 60 MG capsule TAKE 1 CAPSULE BY MOUTH EVERY DAY - 90 capsule 1   lisinopril (ZESTRIL) 10 MG tablet Take 1 tablet (10 mg total) by mouth daily. (Patient taking differently: Take 20 mg by mouth every evening.) 90 tablet 3   metoprolol succinate (TOPROL XL) 25 MG 24 hr tablet Take 1 tablet (25 mg total) by mouth daily. (Patient taking differently: Take 12.5 mg by mouth daily.) 90 tablet 3   NARCAN 4 MG/0.1ML  LIQD nasal spray kit Place 1 spray into the nose as needed (as directed for emergency). 2 each 0   oxycodone (ROXICODONE) 30 MG immediate release tablet Take 1 tablet (30 mg total) by mouth every 4 (four) hours as needed fo pain (Patient taking differently: Take 30 mg by mouth every 4 (four) hours as needed for pain.) 42 tablet 0   silver sulfADIAZINE (SILVADENE) 1 % cream APPLY 1 APPLICATION TOPICALLY DAILY (Patient taking differently: Apply 1 Application topically 2 (two) times daily.) 400 g 0   sulfamethoxazole-trimethoprim (BACTRIM DS) 800-160 MG tablet TAKE 1 TABLET BY MOUTH TWICE A DAY 30 tablet 1   tamsulosin (FLOMAX) 0.4 MG CAPS capsule Take 0.4 mg by mouth daily. am     testosterone cypionate (DEPOTESTOSTERONE CYPIONATE) 200 MG/ML injection Inject 100 mg into the muscle once a week.     torsemide (DEMADEX) 20 MG tablet Take 20  mg by mouth daily.      Results for orders placed or performed during the hospital encounter of 08/16/22 (from the past 48 hour(s))  CBC per protocol     Status: Abnormal   Collection Time: 08/16/22  6:24 AM  Result Value Ref Range   WBC 4.0 4.0 - 10.5 K/uL   RBC 4.29 4.22 - 5.81 MIL/uL   Hemoglobin 10.9 (L) 13.0 - 17.0 g/dL   HCT 16.1 (L) 09.6 - 04.5 %   MCV 81.4 80.0 - 100.0 fL   MCH 25.4 (L) 26.0 - 34.0 pg   MCHC 31.2 30.0 - 36.0 g/dL   RDW 40.9 (H) 81.1 - 91.4 %   Platelets 249 150 - 400 K/uL   nRBC 0.0 0.0 - 0.2 %    Comment: Performed at [facility], [address] [place], [place] [address]   No results found.  Review of Systems  All other systems reviewed and are negative.   Blood pressure 98/68, pulse (!) 55, temperature 97.9 F (36.6 C), temperature source Oral, resp. rate 16, height 6\' 8"  (2.032 m), weight 98.9 kg. Physical Exam  Patient is alert, oriented, no adenopathy, well-dressed, normal affect, normal respiratory effort. On examination of the right foot his transmetatarsal amputation is nearly healed there is a 2 mm open area  the length of his incision this is filled in with pink flat tissue no drainage no surrounding maceration or callus on examination of the left foot beneath the MTP joint of the great toe there is a 1 cm in diameter callused ulceration this was debrided with a 10 blade knife back to viable tissue there 3 mm of depth there is scant bloody drainage and granulation.  To the left third toe there is sausage digit swelling with erythema and warmth there is a 5 mm diameter open ulcer there is no purulence.  No ascending cellulitis on the left.  Does have a palpable dorsalis pedis pulse. Assessment/Plan Assessment: Osteomyelitis third toe left foot.  Plan: Will plan for left third toe amputation.  Risk and benefits were discussed including not healing the wound need for additional surgery.  Patient states he understands wishes to proceed at this time.  Nadara Mustard, MD 08/16/2022, 6:57 AM

## 2022-08-16 NOTE — Transfer of Care (Signed)
Immediate Anesthesia Transfer of Care Note  Patient: Thomas [name]  Procedure(s) Performed: LEFT 3RD TOE AMPUTATION (Left)  Patient Location: PACU  Anesthesia Type:MAC  Level of Consciousness: drowsy and patient cooperative  Airway & Oxygen Therapy: Patient Spontanous Breathing and Patient connected to nasal cannula oxygen  Post-op Assessment: Report given to RN and Post -op Vital signs reviewed and stable  Post vital signs: Reviewed and stable  Last Vitals:  Vitals Value Taken Time  BP 114/71 08/16/22 0803  Temp    Pulse 49 08/16/22 0805  Resp 16 08/16/22 0805  SpO2 99 % 08/16/22 0805  Vitals shown include unvalidated device data.  Last Pain:  Vitals:   08/16/22 0628  TempSrc: Oral  PainSc: 9       Patients Stated Pain Goal: 4 (08/16/22 7829)  Complications: No notable events documented.

## 2022-08-17 ENCOUNTER — Telehealth: Payer: Self-pay

## 2022-08-17 ENCOUNTER — Encounter (HOSPITAL_COMMUNITY): Payer: Self-pay | Admitting: Orthopedic Surgery

## 2022-08-17 NOTE — Telephone Encounter (Signed)
Patient called wanting to know if it is normal for his left 3rd toe to still be bleeding.  Stated that he has been elevating, but not at 100%.  Patient had left 3rd toe amputation on 08/16/2022.  Cb#[id number].  Please advise.  Thank you.

## 2022-08-17 NOTE — Telephone Encounter (Signed)
Patient called back concerning showering and per Faythe Casa., Dr. Audrie Lia assistant advised that patient should not get left foot wet. Advised to message to patient voiced that he understands.

## 2022-08-17 NOTE — Telephone Encounter (Signed)
SW pt, he says that he does have blood on bandage when he goes to change it out. It does not increase in amount each time. He said Dr. Lajoyce Corners told him this is what he can expect bleeding after surgery. I told him that if he notices increased each time then to call us, but pre-operatively is normal. He just needs to keep leg elevated above heart level and to not put any weight on foot at all, as this will help with bleeding.

## 2022-08-18 ENCOUNTER — Telehealth: Payer: Self-pay | Admitting: Family

## 2022-08-18 NOTE — Telephone Encounter (Signed)
I called pt he s/p a 3rd ray amputation 08/16/2022 he has removed his surgical dressing and applying silvadene to the incision and "wrapping it tight" to ensure that he keeps the incision together. I advised not to apply the silvadene just a dry dressing change daily, do not get incision wet, apply dressing but do not make tight. He can take as per protocol 2 aleve twice a day. Offered appt today for eval but pt did not have transportation. He said that he will call with any other questions and that he will keep his appt on 08/23/2022 verbalized understanding of instructions and was happy with plan.

## 2022-08-18 NOTE — Telephone Encounter (Signed)
Pt called stating he is in severe pains from toe amputation and is asking for a call back right away. He states the oxycodone is not touching his pain at all. Please call pt back at [phone].

## 2022-08-21 ENCOUNTER — Ambulatory Visit (INDEPENDENT_AMBULATORY_CARE_PROVIDER_SITE_OTHER): Payer: Medicare Other | Admitting: Orthopedic Surgery

## 2022-08-21 ENCOUNTER — Encounter: Payer: Self-pay | Admitting: Orthopedic Surgery

## 2022-08-21 ENCOUNTER — Telehealth: Payer: Self-pay | Admitting: Orthopedic Surgery

## 2022-08-21 DIAGNOSIS — L97521 Non-pressure chronic ulcer of other part of left foot limited to breakdown of skin: Secondary | ICD-10-CM

## 2022-08-21 MED ORDER — OXYCODONE HCL 30 MG PO TABS: 30.0000 mg | ORAL_TABLET | ORAL | 0 refills | Status: DC | PRN

## 2022-08-21 NOTE — Progress Notes (Signed)
Office Visit Note   Patient: Thomas [name]           Date of Birth: [birth year]           MRN: [id number] Visit Date: 08/21/2022              Requested by: Carney Living, MD [address] [place],  [place] [address] PCP: Carney Living, MD  Chief Complaint  Patient presents with   Left Foot - Routine Post Op    4.24.24 left 3rd ray amputation      HPI: Patient is a 72 year old gentleman who is 1 week status post left third ray amputation.  Patient states he has had some bleeding recently.  Patient is using a walker touchdown weightbearing not wearing his postoperative shoe.  Assessment & Plan: Visit Diagnoses:  1. Skin ulcer of third toe of left foot, limited to breakdown of skin (HCC)     Plan: Prescription for oxycodone is called in.  Discussed the importance of elevation and minimizing weightbearing.  He will start Dial soap cleansing reevaluate in 1 week.  Follow-Up Instructions: Return in about 1 week (around 08/28/2022).   Ortho Exam  Patient is alert, oriented, no adenopathy, well-dressed, normal affect, normal respiratory effort. Examination there is slight wound dehiscence he has a palpable dorsalis pedis pulse he has a small abrasion over the fifth toe.  There is a small ulcer over the medial aspect of the second toe with healthy granulation tissue.  Imaging: No results found. No images are attached to the encounter.  Labs: Lab Results  Component Value Date   HGBA1C 6.2 (H) 02/03/2020   HGBA1C 5.3 10/28/2015   HGBA1C 5.6 08/10/2014   ESRSEDRATE 14 06/21/2021   ESRSEDRATE 17 05/18/2021   ESRSEDRATE 20 (H) 03/28/2020   CRP 19.2 (H) 06/21/2021   CRP 11.1 (H) 05/18/2021   CRP 2.7 (H) 03/28/2020   REPTSTATUS 03/31/2020 FINAL 03/26/2020   GRAMSTAIN NO WBC SEEN NO ORGANISMS SEEN  03/26/2020   CULT  03/26/2020    No growth aerobically or anaerobically. Performed at [facility], [address] [place], [place] [address]     [facility] STAPHYLOCOCCUS AUREUS 03/26/2020     Lab Results  Component Value Date   ALBUMIN 4.0 06/30/2022   ALBUMIN 3.0 (L) 03/26/2020   ALBUMIN 3.6 03/25/2020    Lab Results  Component Value Date   MG 1.8 05/22/2021   MG 2.0 02/12/2020   MG 1.7 02/11/2020   Lab Results  Component Value Date   VD25OH 28.61 (L) 05/05/2020    No results found for: "PREALBUMIN"    Latest Ref Rng & Units 08/16/2022    6:24 AM 03/10/2022   10:10 AM 02/03/2022    2:24 PM  CBC EXTENDED  WBC 4.0 - 10.5 K/uL 4.0  5.1  10.6   RBC 4.22 - 5.81 MIL/uL 4.29  4.80  4.84   Hemoglobin 13.0 - 17.0 g/dL 82.9  56.2  13.0   HCT 39.0 - 52.0 % 34.9  37.4  39.6   Platelets 150 - 400 K/uL 249  370  311      There is no height or weight on file to calculate BMI.  Orders:  No orders of the defined types were placed in this encounter.  No orders of the defined types were placed in this encounter.    Procedures: No procedures performed  Clinical Data: No additional findings.  ROS:  All other systems negative, except  as noted in the HPI. Review of Systems  Objective: Vital Signs: There were no vitals taken for this visit.  Specialty Comments:  No specialty comments available.  PMFS History: Patient Active Problem List   Diagnosis Date Noted   Osteomyelitis of third toe of left foot (HCC) 08/16/2022   Depressed mood 06/20/2022   Dehiscence of amputation stump (HCC) 03/10/2022   History of transmetatarsal amputation of right foot (HCC) 02/15/2022   Chronic osteomyelitis of toe, right (HCC) 02/10/2022   Fracture of intertrochanteric section of femur, closed, right, with nonunion, subsequent encounter 12/24/2020   Inguinal hernia 10/18/2020   Chronic combined systolic and diastolic CHF, NYHA class 2 (HCC)    Osteoporosis 05/05/2020   Foot infection    NICM (nonischemic cardiomyopathy) (HCC), no significant CAD on cardiac cath   02/12/2020   Right foot pain    Rheumatoid arthritis (HCC)     Atrial flutter (HCC) 02/02/2020   Lower extremity edema 12/30/2019   Weight loss, non-intentional 11/24/2019   Chronic right hip pain 06/19/2019   Degenerative tear of acetabular labrum of right hip 05/02/2019   Primary osteoarthritis of right hip 05/02/2019   Skin ulcer (HCC) 01/29/2019   Malignant neoplasm of prostate (HCC) 01/16/2018   Insomnia 10/02/2017   S/P bilateral TKA 09/13/2015   Other bilateral secondary osteoarthritis of knee 07/14/2014   Heme positive stool 05/21/2013   Osteoarthritis, multiple sites 01/13/2011   Hypertension 12/06/2010   CLAUSTROPHOBIA 03/01/2010   Past Medical History:  Diagnosis Date   Anemia    Atrial flutter (HCC) 01/2020   Cancer (HCC)    prostate   COVID 2021   mild case   Dysrhythmia    Aflutter   Heart failure with reduced ejection fraction (HCC)    Pt denies, it is in cardiologist notes.   Heart murmur    Hypertension    Hypoglycemia    occ   NICM (nonischemic cardiomyopathy) (HCC) 02/12/2020   Prostate cancer (HCC) 2019   Rheumatoid arthritis (HCC)     Family History  Problem Relation Age of Onset   Prostate cancer Father    Prostate cancer Brother    Colon cancer Neg Hx    Rectal cancer Neg Hx    Stomach cancer Neg Hx     Past Surgical History:  Procedure Laterality Date   AMPUTATION Right 02/10/2022   Procedure: RIGHT TRANSMETATARSAL AMPUTATION;  Surgeon: Nadara Mustard, MD;  Location: [facility] OR;  Service: Orthopedics;  Laterality: Right;   AMPUTATION Right 03/10/2022   Procedure: RIGHT FOOT CHOPART AMPUTATION;  Surgeon: Nadara Mustard, MD;  Location: [facility] OR;  Service: Orthopedics;  Laterality: Right;   AMPUTATION Left 08/16/2022   Procedure: LEFT 3RD TOE AMPUTATION;  Surgeon: Nadara Mustard, MD;  Location: [facility] OR;  Service: Orthopedics;  Laterality: Left;   AMPUTATION TOE Right 03/26/2020   Procedure: Right 3rd toe partial amputation, bone biopsy right 1st metatarsal;  Surgeon: Park Liter, DPM;  Location: [facility] ORS;   Service: Podiatry;  Laterality: Right;   BUBBLE STUDY  02/11/2020   Procedure: BUBBLE STUDY;  Surgeon: Sande Rives, MD;  Location: [facility] ENDOSCOPY;  Service: Cardiovascular;;   CARDIOVERSION N/A 02/11/2020   Procedure: CARDIOVERSION;  Surgeon: Sande Rives, MD;  Location: [facility] ENDOSCOPY;  Service: Cardiovascular;  Laterality: N/A;   CARDIOVERSION N/A 08/31/2021   Procedure: CARDIOVERSION;  Surgeon: Jake Bathe, MD;  Location: [facility] ENDOSCOPY;  Service: Cardiovascular;  Laterality: N/A;   CYSTOSCOPY  04/11/2018  Procedure: CYSTOSCOPY FLEXIBLE;  Surgeon: Crist Fat, MD;  Location: [facility];  Service: Urology;;  NO SEEDS FOUND IN BLADDER   HERNIA REPAIR  2009   inguinal   INTRAMEDULLARY (IM) NAIL INTERTROCHANTERIC Right 05/05/2020   Procedure: CPT 27245-Cephalomedullary nailing of right intertrochanteric femur fracture;  Surgeon: Roby Lofts, MD;  Location: [facility] OR;  Service: Orthopedics;  Laterality: Right;   INTRAMEDULLARY (IM) NAIL INTERTROCHANTERIC Right 12/24/2020   Procedure: REVISION FIXATION OF RIGHT INTERTROCHANTERIC FEMUR NONUNION;  Surgeon: Roby Lofts, MD;  Location: [facility] OR;  Service: Orthopedics;  Laterality: Right;   IRRIGATION AND DEBRIDEMENT FOOT Left 03/26/2020   Procedure: Left foot incision and drainage with removal of all non-viable soft tissue and bone - areas overlying the 2nd and 5th metatarsals.;  Surgeon: Park Liter, DPM;  Location: [facility]S;  Service: Podiatry;  Laterality: Left;   RADIOACTIVE SEED IMPLANT N/A 04/11/2018   Procedure: RADIOACTIVE SEED IMPLANT/BRACHYTHERAPY IMPLANT;  Surgeon: Crist Fat, MD;  Location: [facility];  Service: Urology;  Laterality: N/A;   71     SEEDS IMPLANTED   RIGHT/LEFT HEART CATH AND CORONARY ANGIOGRAPHY N/A 02/09/2020   Procedure: RIGHT/LEFT HEART CATH AND CORONARY ANGIOGRAPHY;  Surgeon: Yvonne Kendall, MD;  Location: [facility] INVASIVE CV LAB;  Service: Cardiovascular;   Laterality: N/A;   SPACE OAR INSTILLATION N/A 04/11/2018   Procedure: SPACE OAR INSTILLATION;  Surgeon: Crist Fat, MD;  Location: [facility];  Service: Urology;  Laterality: N/A;   TEE WITHOUT CARDIOVERSION N/A 02/11/2020   Procedure: TRANSESOPHAGEAL ECHOCARDIOGRAM (TEE);  Surgeon: Sande Rives, MD;  Location: [facility] ENDOSCOPY;  Service: Cardiovascular;  Laterality: N/A;   TOTAL KNEE ARTHROPLASTY Bilateral 09/13/2015   Procedure: BILATERAL KNEE ARTHROPLASTY ;  Surgeon: Durene Romans, MD;  Location: [facility] ORS;  Service: Orthopedics;  Laterality: Bilateral;   Social History   Occupational History   Not on file  Tobacco Use   Smoking status: Never   Smokeless tobacco: Never  Vaping Use   Vaping Use: Never used  Substance and Sexual Activity   Alcohol use: Yes    Alcohol/week: 1.0 standard drink of alcohol    Types: 1 Cans of beer per week   Drug use: Not Currently   Sexual activity: Yes

## 2022-08-21 NOTE — Telephone Encounter (Signed)
Patient called in stating he had some questions about wound care it has been bleeding and he needs pain medication [facility] [address]

## 2022-08-21 NOTE — Telephone Encounter (Signed)
SW pt, he said over the weekend he woke up one morning and had blood all over his bed. He has been wrapping dressing tight on the foot in hopes this will stop the bleeding. Not so much today but does have increased amount of pain, over his baseline. Has a deep dark red color around incision. I offered him an appt this morning to have Dr. Lajoyce Corners check out incision. He is coming in at 10:30 today.

## 2022-08-23 ENCOUNTER — Encounter: Payer: Medicare Other | Admitting: Family

## 2022-08-28 ENCOUNTER — Ambulatory Visit (INDEPENDENT_AMBULATORY_CARE_PROVIDER_SITE_OTHER): Payer: Medicare Other | Admitting: Orthopedic Surgery

## 2022-08-28 ENCOUNTER — Encounter: Payer: Self-pay | Admitting: Orthopedic Surgery

## 2022-08-28 VITALS — Ht >= 80 in | Wt 218.0 lb

## 2022-08-28 DIAGNOSIS — Z89422 Acquired absence of other left toe(s): Secondary | ICD-10-CM

## 2022-08-28 DIAGNOSIS — I739 Peripheral vascular disease, unspecified: Secondary | ICD-10-CM

## 2022-08-28 DIAGNOSIS — M869 Osteomyelitis, unspecified: Secondary | ICD-10-CM

## 2022-08-28 MED ORDER — OXYCODONE HCL 30 MG PO TABS: 30.0000 mg | ORAL_TABLET | ORAL | 0 refills | Status: DC | PRN

## 2022-08-28 NOTE — Progress Notes (Signed)
Office Visit Note   Patient: Thomas [name]           Date of Birth: Feb 01, 1951           MRN: [id number] Visit Date: 08/28/2022              Requested by: Carney Living, MD [address] [place],  [place] [address] PCP: Carney Living, MD  Chief Complaint  Patient presents with  . Left Foot - Routine Post Op, Follow-up    08/16/2022 left 3rd ray amputation      HPI: Patient is a 72 year old gentleman who is 2 weeks status post left foot third ray amputation.  Patient states he has new ulcers of the lateral aspect and medial aspect of his foot.  Patient complains of increasing pain in his foot.  Assessment & Plan: Visit Diagnoses:  1. History of complete ray amputation of third toe of left foot (HCC)   2. Osteomyelitis of third toe of right foot (HCC)   3. PVD (peripheral vascular disease) (HCC)     Plan: Patient has progressive ischemic ulcers and increasing pain with nonhealing of the surgical incision.  Will make referral to vascular vein surgery for vascular evaluation.  Patient has a strong biphasic dorsalis pedis and posterior tibial pulse.  Follow-Up Instructions: Return in about 2 weeks (around 09/11/2022).   Ortho Exam  Patient is alert, oriented, no adenopathy, well-dressed, normal affect, normal respiratory effort. Examination patient has new superficial ulcers over the lateral aspect of the fifth metatarsal head and the lateral aspect of the fourth and fifth toes.  Patient states he is not using shoes or putting pressure on his foot.  Patient also has progressive ulceration over the first metatarsal head.  Since he has not wearing shoe wear and not putting pressure on these areas this appears to be an ischemic change.  The Doppler was used and he has strong biphasic dorsalis pedis and posterior tibial pulse.  Imaging: No results found. No images are attached to the encounter.  Labs: Lab Results  Component Value Date   HGBA1C 6.2 (H)  02/03/2020   HGBA1C 5.3 10/28/2015   HGBA1C 5.6 08/10/2014   ESRSEDRATE 14 06/21/2021   ESRSEDRATE 17 05/18/2021   ESRSEDRATE 20 (H) 03/28/2020   CRP 19.2 (H) 06/21/2021   CRP 11.1 (H) 05/18/2021   CRP 2.7 (H) 03/28/2020   REPTSTATUS 03/31/2020 FINAL 03/26/2020   GRAMSTAIN NO WBC SEEN NO ORGANISMS SEEN  03/26/2020   CULT  03/26/2020    No growth aerobically or anaerobically. Performed at [facility], [address], [place], [place] [address]    [facility] STAPHYLOCOCCUS AUREUS 03/26/2020     Lab Results  Component Value Date   ALBUMIN 4.0 06/30/2022   ALBUMIN 3.0 (L) 03/26/2020   ALBUMIN 3.6 03/25/2020    Lab Results  Component Value Date   MG 1.8 05/22/2021   MG 2.0 02/12/2020   MG 1.7 02/11/2020   Lab Results  Component Value Date   VD25OH 28.61 (L) 05/05/2020    No results found for: "PREALBUMIN"    Latest Ref Rng & Units 08/16/2022    6:24 AM 03/10/2022   10:10 AM 02/03/2022    2:24 PM  CBC EXTENDED  WBC 4.0 - 10.5 K/uL 4.0  5.1  10.6   RBC 4.22 - 5.81 MIL/uL 4.29  4.80  4.84   Hemoglobin 13.0 - 17.0 g/dL 82.9  56.2  13.0   HCT 39.0 -  52.0 % 34.9  37.4  39.6   Platelets 150 - 400 K/uL 249  370  311      Body mass index is 23.95 kg/m.  Orders:  Orders Placed This Encounter  Procedures  . Ambulatory referral to Vascular Surgery   No orders of the defined types were placed in this encounter.    Procedures: No procedures performed  Clinical Data: No additional findings.  ROS:  All other systems negative, except as noted in the HPI. Review of Systems  Objective: Vital Signs: Ht 6\' 8"  (2.032 m)   Wt 218 lb (98.9 kg)   BMI 23.95 kg/m   Specialty Comments:  No specialty comments available.  PMFS History: Patient Active Problem List   Diagnosis Date Noted  . Osteomyelitis of third toe of left foot (HCC) 08/16/2022  . Depressed mood 06/20/2022  . Dehiscence of amputation stump (HCC) 03/10/2022  . History of transmetatarsal  amputation of right foot (HCC) 02/15/2022  . Chronic osteomyelitis of toe, right (HCC) 02/10/2022  . Fracture of intertrochanteric section of femur, closed, right, with nonunion, subsequent encounter 12/24/2020  . Inguinal hernia 10/18/2020  . Chronic combined systolic and diastolic CHF, NYHA class 2 (HCC)   . Osteoporosis 05/05/2020  . Foot infection   . NICM (nonischemic cardiomyopathy) (HCC), no significant CAD on cardiac cath   02/12/2020  . Right foot pain   . Rheumatoid arthritis (HCC)   . Atrial flutter (HCC) 02/02/2020  . Lower extremity edema 12/30/2019  . Weight loss, non-intentional 11/24/2019  . Chronic right hip pain 06/19/2019  . Degenerative tear of acetabular labrum of right hip 05/02/2019  . Primary osteoarthritis of right hip 05/02/2019  . Skin ulcer (HCC) 01/29/2019  . Malignant neoplasm of prostate (HCC) 01/16/2018  . Insomnia 10/02/2017  . S/P bilateral TKA 09/13/2015  . Other bilateral secondary osteoarthritis of knee 07/14/2014  . Heme positive stool 05/21/2013  . Osteoarthritis, multiple sites 01/13/2011  . Hypertension 12/06/2010  . CLAUSTROPHOBIA 03/01/2010   Past Medical History:  Diagnosis Date  . Anemia   . Atrial flutter (HCC) 01/2020  . Cancer [facility])    prostate  . COVID 2021   mild case  . Dysrhythmia    Aflutter  . Heart failure with reduced ejection fraction (HCC)    Pt denies, it is in cardiologist notes.  Marland Kitchen Heart murmur   . Hypertension   . Hypoglycemia    occ  . NICM (nonischemic cardiomyopathy) (HCC) 02/12/2020  . Prostate cancer (HCC) 2019  . Rheumatoid arthritis (HCC)     Family History  Problem Relation Age of Onset  . Prostate cancer Father   . Prostate cancer Brother   . Colon cancer Neg Hx   . Rectal cancer Neg Hx   . Stomach cancer Neg Hx     Past Surgical History:  Procedure Laterality Date  . AMPUTATION Right 02/10/2022   Procedure: RIGHT TRANSMETATARSAL AMPUTATION;  Surgeon: Nadara Mustard, MD;  Location: [facility] OR;   Service: Orthopedics;  Laterality: Right;  . AMPUTATION Right 03/10/2022   Procedure: RIGHT FOOT CHOPART AMPUTATION;  Surgeon: Nadara Mustard, MD;  Location: [facility] OR;  Service: Orthopedics;  Laterality: Right;  . AMPUTATION Left 08/16/2022   Procedure: LEFT 3RD TOE AMPUTATION;  Surgeon: Nadara Mustard, MD;  Location: [facility] OR;  Service: Orthopedics;  Laterality: Left;  . AMPUTATION TOE Right 03/26/2020   Procedure: Right 3rd toe partial amputation, bone biopsy right 1st metatarsal;  Surgeon: Park Liter, DPM;  Location: [facility] ORS;  Service: Podiatry;  Laterality: Right;  . BUBBLE STUDY  02/11/2020   Procedure: BUBBLE STUDY;  Surgeon: Sande Rives, MD;  Location: [facility] ENDOSCOPY;  Service: Cardiovascular;;  . CARDIOVERSION N/A 02/11/2020   Procedure: CARDIOVERSION;  Surgeon: Sande Rives, MD;  Location: [facility] ENDOSCOPY;  Service: Cardiovascular;  Laterality: N/A;  . CARDIOVERSION N/A 08/31/2021   Procedure: CARDIOVERSION;  Surgeon: Jake Bathe, MD;  Location: [facility] ENDOSCOPY;  Service: Cardiovascular;  Laterality: N/A;  . CYSTOSCOPY  04/11/2018   Procedure: Derinda Late;  Surgeon: Crist Fat, MD;  Location: [facility];  Service: Urology;;  NO SEEDS FOUND IN BLADDER  . HERNIA REPAIR  2009   inguinal  . INTRAMEDULLARY (IM) NAIL INTERTROCHANTERIC Right 05/05/2020   Procedure: CPT 27245-Cephalomedullary nailing of right intertrochanteric femur fracture;  Surgeon: Roby Lofts, MD;  Location: [facility] OR;  Service: Orthopedics;  Laterality: Right;  . INTRAMEDULLARY (IM) NAIL INTERTROCHANTERIC Right 12/24/2020   Procedure: REVISION FIXATION OF RIGHT INTERTROCHANTERIC FEMUR NONUNION;  Surgeon: Roby Lofts, MD;  Location: [facility] OR;  Service: Orthopedics;  Laterality: Right;  . IRRIGATION AND DEBRIDEMENT FOOT Left 03/26/2020   Procedure: Left foot incision and drainage with removal of all non-viable soft tissue and bone - areas overlying the 2nd and 5th metatarsals.;   Surgeon: Park Liter, DPM;  Location: [facility]S;  Service: Podiatry;  Laterality: Left;  . RADIOACTIVE SEED IMPLANT N/A 04/11/2018   Procedure: RADIOACTIVE SEED IMPLANT/BRACHYTHERAPY IMPLANT;  Surgeon: Crist Fat, MD;  Location: [facility];  Service: Urology;  Laterality: N/A;   71     SEEDS IMPLANTED  . RIGHT/LEFT HEART CATH AND CORONARY ANGIOGRAPHY N/A 02/09/2020   Procedure: RIGHT/LEFT HEART CATH AND CORONARY ANGIOGRAPHY;  Surgeon: Yvonne Kendall, MD;  Location: [facility] INVASIVE CV LAB;  Service: Cardiovascular;  Laterality: N/A;  . SPACE OAR INSTILLATION N/A 04/11/2018   Procedure: SPACE OAR INSTILLATION;  Surgeon: Crist Fat, MD;  Location: [facility];  Service: Urology;  Laterality: N/A;  . TEE WITHOUT CARDIOVERSION N/A 02/11/2020   Procedure: TRANSESOPHAGEAL ECHOCARDIOGRAM (TEE);  Surgeon: Sande Rives, MD;  Location: [facility] ENDOSCOPY;  Service: Cardiovascular;  Laterality: N/A;  . TOTAL KNEE ARTHROPLASTY Bilateral 09/13/2015   Procedure: BILATERAL KNEE ARTHROPLASTY ;  Surgeon: Durene Romans, MD;  Location: [facility];  Service: Orthopedics;  Laterality: Bilateral;   Social History   Occupational History  . Not on file  Tobacco Use  . Smoking status: Never  . Smokeless tobacco: Never  Vaping Use  . Vaping Use: Never used  Substance and Sexual Activity  . Alcohol use: Yes    Alcohol/week: 1.0 standard drink of alcohol    Types: 1 Cans of beer per week  . Drug use: Not Currently  . Sexual activity: Yes

## 2022-08-29 ENCOUNTER — Other Ambulatory Visit: Payer: Self-pay | Admitting: *Deleted

## 2022-08-29 ENCOUNTER — Ambulatory Visit (HOSPITAL_COMMUNITY)
Admission: RE | Admit: 2022-08-29 | Discharge: 2022-08-29 | Disposition: A | Discharge status: Home / Self Care | Payer: Medicare Other | Source: Ambulatory Visit | Attending: Student | Admitting: Student

## 2022-08-29 DIAGNOSIS — S72141D Displaced intertrochanteric fracture of right femur, subsequent encounter for closed fracture with routine healing: Secondary | ICD-10-CM | POA: Insufficient documentation

## 2022-08-29 DIAGNOSIS — I739 Peripheral vascular disease, unspecified: Secondary | ICD-10-CM

## 2022-08-29 DIAGNOSIS — S72001A Fracture of unspecified part of neck of right femur, initial encounter for closed fracture: Secondary | ICD-10-CM | POA: Diagnosis not present

## 2022-08-30 ENCOUNTER — Ambulatory Visit (HOSPITAL_COMMUNITY): Payer: Medicare Other | Attending: Vascular Surgery

## 2022-08-30 ENCOUNTER — Encounter: Payer: Medicare Other | Admitting: Vascular Surgery

## 2022-09-04 ENCOUNTER — Telehealth: Payer: Self-pay

## 2022-09-04 ENCOUNTER — Other Ambulatory Visit: Payer: Self-pay | Admitting: Orthopedic Surgery

## 2022-09-04 MED ORDER — OXYCODONE HCL 30 MG PO TABS: 30.0000 mg | ORAL_TABLET | ORAL | 0 refills | Status: DC | PRN

## 2022-09-04 NOTE — Telephone Encounter (Signed)
Patient called in because his foot incision has opened over the weekend when he tried to wear a normal shoe There is serous drainage, no blood. He is unable to come in today, due to lack of transportation -- work-in appt at 2:15 tomorrow afternoon with Dr. Lajoyce Corners. Instructed the patient to rewrap the foot with gauze dressing and elevate that foot/leg. Also, he missed his vascular consult on 08/30/22 (also due to transportation issue). I gave him the number to call them and reschedule that appt -- the patient voiced understanding and said he would call them to reschedule.    The patient said he took the last of his oxycodone over the weekend and is requesting refill of this, or something that will help with this pain. He states that the pain has increased with that opening of his incision.

## 2022-09-05 ENCOUNTER — Ambulatory Visit (INDEPENDENT_AMBULATORY_CARE_PROVIDER_SITE_OTHER): Payer: Medicare Other | Admitting: Orthopedic Surgery

## 2022-09-05 ENCOUNTER — Telehealth: Payer: Self-pay | Admitting: Orthopedic Surgery

## 2022-09-05 DIAGNOSIS — I739 Peripheral vascular disease, unspecified: Secondary | ICD-10-CM

## 2022-09-05 DIAGNOSIS — T8781 Dehiscence of amputation stump: Secondary | ICD-10-CM | POA: Diagnosis not present

## 2022-09-05 NOTE — Telephone Encounter (Signed)
Patient called advised the pain medicine Oxycodone will not be filled until tomorrow unless Dr. Lajoyce Corners call and get the pharmacy to fill the Rx today. The number to contact patient is [phone]

## 2022-09-05 NOTE — Telephone Encounter (Signed)
Pt is in the office today and will discuss with Dr. Lajoyce Corners while in office.

## 2022-09-06 LAB — HEMOGLOBIN A1C
Hgb A1c MFr Bld: 6 % of total Hgb — ABNORMAL HIGH (ref <5.7)
Mean Plasma Glucose: 126 mg/dL
eAG (mmol/L): 7 mmol/L

## 2022-09-07 DIAGNOSIS — K080 Exfoliation of teeth due to systemic causes: Secondary | ICD-10-CM | POA: Diagnosis not present

## 2022-09-11 ENCOUNTER — Telehealth: Payer: Self-pay | Admitting: Orthopedic Surgery

## 2022-09-11 ENCOUNTER — Encounter (HOSPITAL_COMMUNITY): Payer: Self-pay | Admitting: Orthopedic Surgery

## 2022-09-11 ENCOUNTER — Other Ambulatory Visit: Payer: Self-pay

## 2022-09-11 ENCOUNTER — Other Ambulatory Visit: Payer: Self-pay | Admitting: Orthopedic Surgery

## 2022-09-11 ENCOUNTER — Encounter: Payer: Medicare Other | Admitting: Orthopedic Surgery

## 2022-09-11 MED ORDER — OXYCODONE HCL 30 MG PO TABS: 30.0000 mg | ORAL_TABLET | ORAL | 0 refills | Status: DC | PRN

## 2022-09-11 NOTE — Progress Notes (Addendum)
Mr. Thomas [name] denies chest pain or shortness of breath. Patient denies having any s/s of Covid in his household, also denies any known exposure to Covid. Mr. Thomas [name] denies  any s/s of upper or lower respiratory in the past 8 weeks.   Mr. Thomas [name] PCP is Dr. Pearlean Brownie. Cardiologist is Dr. Laurance Flatten.

## 2022-09-11 NOTE — Telephone Encounter (Signed)
Pt is having surgery on 09/13/22 for transmet amputation. He is requesting to have his oxycodone 30mg  be filled by tomorrow so he can get this before his surgery, so he doesn't have to worry about getting this refill after his surgery and "fight" with the pharmacy. His last refill was 09/04/22.

## 2022-09-11 NOTE — Telephone Encounter (Signed)
Lm on vm that Rx has been sent. 

## 2022-09-11 NOTE — Telephone Encounter (Signed)
I already have a message open about him returning my call. Each time I call him his phone rings once and then goes to VM. Will close out this message.

## 2022-09-11 NOTE — Telephone Encounter (Signed)
Pt returned call to [place]. Please call him back. Pt  phone is  [phone].

## 2022-09-11 NOTE — Telephone Encounter (Signed)
Patient asking for a supplement to be called in prior to Wednesday., Please adivse

## 2022-09-11 NOTE — Telephone Encounter (Signed)
[facility] She reported that pt is getting this same medication oxycodone 30mg  q4h that you Rx, from Dr. Marisue Humble every Friday of the week. He is averaging about 10 tablets a day of this med from her prescription. She says that he reports he supplements with this medication. He gets #300 tablets a month of oxycodone. Please advise. It seems he doesn't get good pain coverage with the amount he is taking.

## 2022-09-11 NOTE — Telephone Encounter (Signed)
See other message, I called and left him a vm that Rx has been sent to pharmacy, it rang once and went straight to VM and I called again just now, same thing. Did not leave VM.

## 2022-09-11 NOTE — Telephone Encounter (Signed)
Patient called in stating [facility] [address] and they will not fill the Rx until he calls the pharmacy and gives reason for Rx and permission for pt to pick it up the day before his surgery the Tech name is Eliberto Ivory please advise

## 2022-09-11 NOTE — Telephone Encounter (Signed)
Pt returned call back to [place] W. Please call pt at [phone]

## 2022-09-12 ENCOUNTER — Telehealth: Payer: Self-pay | Admitting: Orthopedic Surgery

## 2022-09-12 DIAGNOSIS — S72141D Displaced intertrochanteric fracture of right femur, subsequent encounter for closed fracture with routine healing: Secondary | ICD-10-CM | POA: Diagnosis not present

## 2022-09-12 NOTE — Anesthesia Preprocedure Evaluation (Addendum)
Anesthesia Evaluation  Patient identified by MRN, date of birth, ID band Patient awake    Reviewed: Allergy & Precautions, NPO status , Patient's Chart, lab work & pertinent test results, reviewed documented beta blocker date and time   History of Anesthesia Complications Negative for: history of anesthetic complications  Airway Mallampati: II  TM Distance: >3 FB Neck ROM: Full    Dental  (+) Missing, Poor Dentition,    Pulmonary neg pulmonary ROS   Pulmonary exam normal        Cardiovascular hypertension, Pt. on medications and Pt. on home beta blockers +CHF  Normal cardiovascular exam+ dysrhythmias Atrial Fibrillation   TTE 10/2021: EF 55-60%, mild LVH, grade 1 DD, mild dilatation of the aortic root measuring 41mm    Neuro/Psych   Anxiety     negative neurological ROS     GI/Hepatic negative GI ROS, Neg liver ROS,,,  Endo/Other  negative endocrine ROS    Renal/GU negative Renal ROS     Musculoskeletal  (+) Arthritis , Rheumatoid disorders,  Osteomyelitis left foot   Abdominal   Peds  Hematology negative hematology ROS (+)   Anesthesia Other Findings Day of surgery medications reviewed with patient.  Reproductive/Obstetrics negative OB ROS                              Anesthesia Physical Anesthesia Plan  ASA: 3  Anesthesia Plan: Regional and MAC   Post-op Pain Management: Tylenol PO (pre-op)*   Induction:   PONV Risk Score and Plan: 1 and Propofol infusion, Treatment may vary due to age or medical condition, Midazolam and Ondansetron  Airway Management Planned: Simple Face Mask and Natural Airway  Additional Equipment: None  Intra-op Plan:   Post-operative Plan:   Informed Consent: I have reviewed the patients History and Physical, chart, labs and discussed the procedure including the risks, benefits and alternatives for the proposed anesthesia with the patient or  authorized representative who has indicated his/her understanding and acceptance.       Plan Discussed with: CRNA  Anesthesia Plan Comments:          Anesthesia Quick Evaluation

## 2022-09-12 NOTE — Telephone Encounter (Signed)
Pt called this morning statin it is very important that Dr Lajoyce Corners or Autumn F. Or [place] W call him back. Pt phone number is [phone].

## 2022-09-12 NOTE — Telephone Encounter (Signed)
I had a 15 min long conversation with the pt. He states that he is on his last tablet of Oxycodone 30 mg today. He states that he has taken more that the rx called for to " keep up with my pain" the pt has had a qty of 166 tablets in the past 21 days and states that if he is not given something for tomorrow he will cancel surgery. He has an appt with the doctor that has been giving him the weekly rx pain medication today and will see if they will give rx early. Pt states that if they refuse to fill today then he will call and cancel the surgery.

## 2022-09-12 NOTE — Telephone Encounter (Signed)
Called [facility], they were out of office for lunch, left message on VM to cancel the order that was sent in yesterday. Dr. Lajoyce Corners will not be refilling this in the future.

## 2022-09-13 ENCOUNTER — Encounter (HOSPITAL_COMMUNITY)
Admission: RE | Disposition: A | Discharge status: Home / Self Care | Payer: Self-pay | Source: Home / Self Care | Attending: Orthopedic Surgery

## 2022-09-13 ENCOUNTER — Ambulatory Visit (HOSPITAL_COMMUNITY): Payer: Medicare Other | Admitting: Certified Registered"

## 2022-09-13 ENCOUNTER — Ambulatory Visit (HOSPITAL_COMMUNITY)
Admission: RE | Admit: 2022-09-13 | Discharge: 2022-09-13 | Disposition: A | Discharge status: Home / Self Care | Payer: Medicare Other | Attending: Orthopedic Surgery | Admitting: Orthopedic Surgery

## 2022-09-13 ENCOUNTER — Telehealth: Payer: Self-pay | Admitting: Family

## 2022-09-13 ENCOUNTER — Other Ambulatory Visit: Payer: Self-pay

## 2022-09-13 ENCOUNTER — Ambulatory Visit (HOSPITAL_BASED_OUTPATIENT_CLINIC_OR_DEPARTMENT_OTHER): Payer: Medicare Other | Admitting: Certified Registered"

## 2022-09-13 ENCOUNTER — Encounter (HOSPITAL_COMMUNITY): Payer: Self-pay | Admitting: Orthopedic Surgery

## 2022-09-13 ENCOUNTER — Telehealth: Payer: Self-pay

## 2022-09-13 DIAGNOSIS — Z79899 Other long term (current) drug therapy: Secondary | ICD-10-CM | POA: Diagnosis not present

## 2022-09-13 DIAGNOSIS — M869 Osteomyelitis, unspecified: Secondary | ICD-10-CM | POA: Insufficient documentation

## 2022-09-13 DIAGNOSIS — I4892 Unspecified atrial flutter: Secondary | ICD-10-CM | POA: Diagnosis not present

## 2022-09-13 DIAGNOSIS — T8130XA Disruption of wound, unspecified, initial encounter: Secondary | ICD-10-CM | POA: Diagnosis not present

## 2022-09-13 DIAGNOSIS — I110 Hypertensive heart disease with heart failure: Secondary | ICD-10-CM | POA: Insufficient documentation

## 2022-09-13 DIAGNOSIS — Z7901 Long term (current) use of anticoagulants: Secondary | ICD-10-CM | POA: Diagnosis not present

## 2022-09-13 DIAGNOSIS — I509 Heart failure, unspecified: Secondary | ICD-10-CM | POA: Insufficient documentation

## 2022-09-13 DIAGNOSIS — M069 Rheumatoid arthritis, unspecified: Secondary | ICD-10-CM | POA: Diagnosis not present

## 2022-09-13 DIAGNOSIS — I4891 Unspecified atrial fibrillation: Secondary | ICD-10-CM | POA: Insufficient documentation

## 2022-09-13 DIAGNOSIS — Z8546 Personal history of malignant neoplasm of prostate: Secondary | ICD-10-CM | POA: Diagnosis not present

## 2022-09-13 DIAGNOSIS — T8781 Dehiscence of amputation stump: Secondary | ICD-10-CM | POA: Diagnosis present

## 2022-09-13 DIAGNOSIS — I428 Other cardiomyopathies: Secondary | ICD-10-CM | POA: Insufficient documentation

## 2022-09-13 HISTORY — PX: AMPUTATION: SHX166

## 2022-09-13 SURGERY — AMPUTATION, FOOT, RAY
Anesthesia: Monitor Anesthesia Care | Site: Foot | Laterality: Left

## 2022-09-13 MED ORDER — FENTANYL CITRATE (PF) 100 MCG/2ML IJ SOLN: 25.0000 ug | INTRAMUSCULAR | Status: DC | PRN

## 2022-09-13 MED ORDER — OXYCODONE HCL 5 MG PO TABS: 5.0000 mg | ORAL_TABLET | Freq: Once | ORAL | Status: DC | PRN

## 2022-09-13 MED ORDER — LACTATED RINGERS IV SOLN: INTRAVENOUS | Status: DC

## 2022-09-13 MED ORDER — ORAL CARE MOUTH RINSE: 15.0000 mL | Freq: Once | OROMUCOSAL | Status: AC

## 2022-09-13 MED ORDER — VASOPRESSIN 20 UNIT/ML IV SOLN
INTRAVENOUS | Status: AC
  Filled 2022-09-13: qty 1

## 2022-09-13 MED ORDER — VANCOMYCIN HCL IN DEXTROSE 1-5 GM/200ML-% IV SOLN: 1000.0000 mg | INTRAVENOUS | Status: AC

## 2022-09-13 MED ORDER — FENTANYL CITRATE (PF) 100 MCG/2ML IJ SOLN
INTRAMUSCULAR | Status: AC
  Filled 2022-09-13: qty 2

## 2022-09-13 MED ORDER — EPHEDRINE SULFATE (PRESSORS) 50 MG/ML IJ SOLN
INTRAMUSCULAR | Status: DC | PRN
  Administered 2022-09-13: 10 mg via INTRAVENOUS

## 2022-09-13 MED ORDER — OXYCODONE HCL 5 MG/5ML PO SOLN: 5.0000 mg | Freq: Once | ORAL | Status: DC | PRN

## 2022-09-13 MED ORDER — ACETAMINOPHEN 500 MG PO TABS: 1000.0000 mg | ORAL_TABLET | Freq: Once | ORAL | Status: AC

## 2022-09-13 MED ORDER — PROPOFOL 500 MG/50ML IV EMUL
INTRAVENOUS | Status: DC | PRN
  Administered 2022-09-13: 100 ug/kg/min via INTRAVENOUS

## 2022-09-13 MED ORDER — ACETAMINOPHEN 500 MG PO TABS
ORAL_TABLET | ORAL | Status: AC
  Administered 2022-09-13: 1000 mg via ORAL
  Filled 2022-09-13: qty 2

## 2022-09-13 MED ORDER — BUPIVACAINE HCL (PF) 0.5 % IJ SOLN
INTRAMUSCULAR | Status: DC | PRN
  Administered 2022-09-13: 30 mL via PERINEURAL

## 2022-09-13 MED ORDER — CHLORHEXIDINE GLUCONATE 0.12 % MT SOLN: 15.0000 mL | Freq: Once | OROMUCOSAL | Status: AC

## 2022-09-13 MED ORDER — PROPOFOL 10 MG/ML IV BOLUS
INTRAVENOUS | Status: DC | PRN
  Administered 2022-09-13: 30 mg via INTRAVENOUS

## 2022-09-13 MED ORDER — MIDAZOLAM HCL 2 MG/2ML IJ SOLN
INTRAMUSCULAR | Status: AC
  Filled 2022-09-13: qty 2

## 2022-09-13 MED ORDER — CHLORHEXIDINE GLUCONATE 0.12 % MT SOLN
OROMUCOSAL | Status: AC
  Administered 2022-09-13: 15 mL via OROMUCOSAL
  Filled 2022-09-13: qty 15

## 2022-09-13 MED ORDER — FENTANYL CITRATE (PF) 250 MCG/5ML IJ SOLN
INTRAMUSCULAR | Status: DC | PRN
  Administered 2022-09-13 (×2): 50 ug via INTRAVENOUS

## 2022-09-13 MED ORDER — OXYCODONE HCL 30 MG PO TABS: 30.0000 mg | ORAL_TABLET | ORAL | 0 refills | Status: DC | PRN

## 2022-09-13 MED ORDER — VANCOMYCIN HCL IN DEXTROSE 1-5 GM/200ML-% IV SOLN
INTRAVENOUS | Status: AC
  Administered 2022-09-13: 1000 mg via INTRAVENOUS
  Filled 2022-09-13: qty 200

## 2022-09-13 MED ORDER — 0.9 % SODIUM CHLORIDE (POUR BTL) OPTIME
TOPICAL | Status: DC | PRN
  Administered 2022-09-13: 1000 mL

## 2022-09-13 MED ORDER — MIDAZOLAM HCL 2 MG/2ML IJ SOLN
INTRAMUSCULAR | Status: DC | PRN
  Administered 2022-09-13 (×2): 1 mg via INTRAVENOUS

## 2022-09-13 MED ORDER — LIDOCAINE 2% (20 MG/ML) 5 ML SYRINGE
INTRAMUSCULAR | Status: AC
  Filled 2022-09-13: qty 5

## 2022-09-13 MED ORDER — LEVOFLOXACIN IN D5W 500 MG/100ML IV SOLN
500.0000 mg | INTRAVENOUS | Status: AC
  Administered 2022-09-13: 500 mg via INTRAVENOUS

## 2022-09-13 MED ORDER — LIDOCAINE 2% (20 MG/ML) 5 ML SYRINGE
INTRAMUSCULAR | Status: DC | PRN
  Administered 2022-09-13: 20 mg via INTRAVENOUS

## 2022-09-13 MED ORDER — HYDROMORPHONE HCL 1 MG/ML IJ SOLN
INTRAMUSCULAR | Status: DC | PRN
  Administered 2022-09-13: .5 mg via INTRAVENOUS

## 2022-09-13 MED ORDER — HYDROMORPHONE HCL 1 MG/ML IJ SOLN
INTRAMUSCULAR | Status: AC
  Filled 2022-09-13: qty 0.5

## 2022-09-13 SURGICAL SUPPLY — 37 items
BAG COUNTER SPONGE SURGICOUNT (BAG) ×1 IMPLANT
BAG SPNG CNTER NS LX DISP (BAG)
BLADE SAW SGTL MED 73X18.5 STR (BLADE) IMPLANT
BLADE SURG 21 STRL SS (BLADE) ×1 IMPLANT
BNDG CMPR 5X4 CHSV STRCH STRL (GAUZE/BANDAGES/DRESSINGS) ×1
BNDG COHESIVE 4X5 TAN STRL (GAUZE/BANDAGES/DRESSINGS) ×1 IMPLANT
BNDG COHESIVE 4X5 TAN STRL LF (GAUZE/BANDAGES/DRESSINGS) IMPLANT
BNDG GAUZE DERMACEA FLUFF 4 (GAUZE/BANDAGES/DRESSINGS) ×1 IMPLANT
BNDG GZE DERMACEA 4 6PLY (GAUZE/BANDAGES/DRESSINGS)
COVER SURGICAL LIGHT HANDLE (MISCELLANEOUS) ×2 IMPLANT
DRAPE DERMATAC (DRAPES) IMPLANT
DRAPE INCISE IOBAN 66X45 STRL (DRAPES) IMPLANT
DRAPE U-SHAPE 47X51 STRL (DRAPES) ×2 IMPLANT
DRESSING PEEL AND PLC PRVNA 13 (GAUZE/BANDAGES/DRESSINGS) IMPLANT
DRSG ADAPTIC 3X8 NADH LF (GAUZE/BANDAGES/DRESSINGS) ×1 IMPLANT
DRSG PEEL AND PLACE PREVENA 13 (GAUZE/BANDAGES/DRESSINGS) ×1
DURAPREP 26ML APPLICATOR (WOUND CARE) ×1 IMPLANT
ELECT REM PT RETURN 9FT ADLT (ELECTROSURGICAL) ×1
ELECTRODE REM PT RTRN 9FT ADLT (ELECTROSURGICAL) ×1 IMPLANT
GAUZE PAD ABD 8X10 STRL (GAUZE/BANDAGES/DRESSINGS) ×2 IMPLANT
GAUZE SPONGE 4X4 12PLY STRL (GAUZE/BANDAGES/DRESSINGS) ×1 IMPLANT
GLOVE BIOGEL PI IND STRL 9 (GLOVE) ×1 IMPLANT
GLOVE SURG ORTHO 9.0 STRL STRW (GLOVE) ×1 IMPLANT
GOWN STRL REUS W/ TWL XL LVL3 (GOWN DISPOSABLE) ×2 IMPLANT
GOWN STRL REUS W/TWL XL LVL3 (GOWN DISPOSABLE) ×2
GRAFT SKIN WND MICRO 38 (Tissue) IMPLANT
KIT BASIN OR (CUSTOM PROCEDURE TRAY) ×1 IMPLANT
KIT DRSG PREVENA PLUS 7DAY 125 (MISCELLANEOUS) IMPLANT
KIT TURNOVER KIT B (KITS) ×1 IMPLANT
NS IRRIG 1000ML POUR BTL (IV SOLUTION) ×1 IMPLANT
PACK ORTHO EXTREMITY (CUSTOM PROCEDURE TRAY) ×1 IMPLANT
PAD ARMBOARD 7.5X6 YLW CONV (MISCELLANEOUS) ×2 IMPLANT
STOCKINETTE IMPERVIOUS LG (DRAPES) IMPLANT
SUT ETHILON 2 0 PSLX (SUTURE) ×1 IMPLANT
TOWEL GREEN STERILE (TOWEL DISPOSABLE) ×1 IMPLANT
TUBE CONNECTING 12X1/4 (SUCTIONS) ×1 IMPLANT
YANKAUER SUCT BULB TIP NO VENT (SUCTIONS) ×1 IMPLANT

## 2022-09-13 NOTE — Telephone Encounter (Signed)
Called pt and he has already rented a knee scooter from [facility] [employer]

## 2022-09-13 NOTE — Op Note (Signed)
09/13/2022  8:10 AM  PATIENT:  Thomas [name]    PRE-OPERATIVE DIAGNOSIS:  Osteomyelitis Left Foot, with dehiscence third ray amputation.  POST-OPERATIVE DIAGNOSIS:  Same  PROCEDURE:  LEFT TRANSMETATARSAL AMPUTATION Application Kerecis micro graft 38 cm. Application Prevena 13 cm wound VAC.  SURGEON:  Nadara Mustard, MD  PHYSICIAN ASSISTANT:None ANESTHESIA:   General  PREOPERATIVE INDICATIONS:  LIAV ROCKEL [name] is a  72 y.o. male with a diagnosis of Osteomyelitis Left Foot who failed conservative measures and elected for surgical management.    The risks benefits and alternatives were discussed with the patient preoperatively including but not limited to the risks of infection, bleeding, nerve injury, cardiopulmonary complications, the need for revision surgery, among others, and the patient was willing to proceed.  OPERATIVE IMPLANTS:   Implant Name Type Inv. Item Serial No. Manufacturer Lot No. LRB No. Used Action  GRAFT SKIN WND MICRO 38 - [id number] Tissue GRAFT SKIN WND MICRO 38  [employer] [id number] Left 1 Implanted    @ENCIMAGES @  OPERATIVE FINDINGS: Patient had good petechial bleeding with calcified arteries.  Tissue margins were clear.  OPERATIVE PROCEDURE: Patient was brought the operating room after undergoing an ankle block.  He then underwent a MAC anesthetic.  After adequate levels anesthesia were obtained patient's left lower extremity was prepped using DuraPrep draped into a sterile field a timeout was called.  A fishmouth incision was made just proximal to the area of wound dehiscence this was carried down to bone and an oscillating saw was used to perform a transmetatarsal amputation beveled plantarly.  Electrocautery was used for hemostasis.  The tissue margins had good bleeding the arteries were calcified there were no ischemic changes there was no signs of infection.  The wound was filled with 38 cm of Kerecis micro graft.  The wound was closed with  2-0 nylon a Prevena 13 cm wound VAC was applied patient was taken the PACU in stable condition.   DISCHARGE PLANNING:  Antibiotic duration: Preoperative antibiotics continue Levaquin  Weightbearing: Minimize weightbearing on the left  Pain medication: Prescription for Oxy 30 mg tablets to get him to Friday to his regular prescription.  Dressing care/ Wound VAC: Wound VAC  Ambulatory devices: Crutches  Discharge to: Home.  Follow-up: In the office 1 week post operative.

## 2022-09-13 NOTE — Telephone Encounter (Signed)
Patient called asked if he can get a Rx for knee scooter? The number to contact patient is [phone]

## 2022-09-13 NOTE — Telephone Encounter (Signed)
Pt cb and said he was able to have someone pick them up. I placed it up front for him

## 2022-09-13 NOTE — Anesthesia Procedure Notes (Signed)
Anesthesia Regional Block: Ankle block   Pre-Anesthetic Checklist: , timeout performed,  Correct Patient, Correct Site, Correct Laterality,  Correct Procedure, Correct Position, site marked,  Risks and benefits discussed,  Pre-op evaluation,  At surgeon's request and post-op pain management  Laterality: Left  Prep: Maximum Sterile Barrier Precautions used, chloraprep       Needles:  Injection technique: Single-shot  Needle Type: Echogenic Needle     Needle Length: 4cm  Needle Gauge: 25     Additional Needles:   Narrative:  Start time: 09/13/2022 7:05 AM End time: 09/13/2022 7:08 AM  Performed by: Personally  Anesthesiologist: Kaylyn Layer, MD  Additional Notes: Risks, benefits, and alternative discussed. Patient gave consent for procedure. Patient prepped and draped in sterile fashion. Sedation administered, patient remains easily responsive to voice. Local anesthetic given in 5cc increments with no signs or symptoms of intravascular injection. No pain or paraesthesias with injection. Patient monitored throughout procedure with signs of LAST or immediate complications. Tolerated well.   Amalia Greenhouse, MD

## 2022-09-13 NOTE — Telephone Encounter (Signed)
Pt called and said his wound vac container is full, was not given spare. He is unable to come get a new container and has no one either to come get it for him. Would like advise on what to do

## 2022-09-13 NOTE — Progress Notes (Signed)
Per Dr. Stephannie Peters, hold pre-op dose of metoprolol. Pt's HR ranging between upper 50's-mid 60's.   Viviano Simas, RN

## 2022-09-13 NOTE — Transfer of Care (Signed)
Immediate Anesthesia Transfer of Care Note  Patient: Thomas [name]  Procedure(s) Performed: LEFT TRANSMETATARSAL AMPUTATION (Left: Foot)  Patient Location: PACU  Anesthesia Type:MAC combined with regional for post-op pain  Level of Consciousness: drowsy  Airway & Oxygen Therapy: Patient Spontanous Breathing and Patient connected to face mask oxygen  Post-op Assessment: Report given to RN and Post -op Vital signs reviewed and stable  Post vital signs: Reviewed and stable  Last Vitals:  Vitals Value Taken Time  BP 119/78 09/13/22 0815  Temp    Pulse 77 09/13/22 0816  Resp 22 09/13/22 0816  SpO2 99 09/13/22  0816    Last Pain:  Vitals:   09/13/22 0609  PainSc: 9       Patients Stated Pain Goal: 1 (09/13/22 0609)  Complications: No notable events documented.

## 2022-09-13 NOTE — Telephone Encounter (Signed)
Did you already talk to this pt? He was the only that needed the canisters correct?

## 2022-09-13 NOTE — Telephone Encounter (Signed)
Patient called stating that he has a question.  Did not state what the question was.  Patient had Left Foot surgery today.  CB# [phone].  Please advise.  Thank you.

## 2022-09-13 NOTE — Anesthesia Postprocedure Evaluation (Signed)
Anesthesia Post Note  Patient: Thomas [name]  Procedure(s) Performed: LEFT TRANSMETATARSAL AMPUTATION (Left: Foot)     Patient location during evaluation: PACU Anesthesia Type: Regional and MAC Level of consciousness: awake and alert Pain management: pain level controlled Vital Signs Assessment: post-procedure vital signs reviewed and stable Respiratory status: spontaneous breathing, nonlabored ventilation and respiratory function stable Cardiovascular status: blood pressure returned to baseline Postop Assessment: no apparent nausea or vomiting Anesthetic complications: no   No notable events documented.  Last Vitals:  Vitals:   09/13/22 0845 09/13/22 0900  BP: 134/81 (!) 150/90  Pulse:  (!) 56  Resp: 12 13  Temp:  (!) 36.4 C  SpO2:  100%    Last Pain:  Vitals:   09/13/22 0900  PainSc: 0-No pain                 Shanda Howells

## 2022-09-13 NOTE — H&P (Signed)
Thomas [name] is an 72 y.o. male.   Chief Complaint: Dehiscence left foot third ray amputation. HPI: Patient is 4 weeks status post left foot third ray amputation.  Patient states he has noticed new ulceration and progressive dehiscence of the ray amputation.  Patient complains of increasing pain in his foot.  Past Medical History:  Diagnosis Date   Anemia    Atrial flutter (HCC) 01/2020   Cancer (HCC)    prostate   COVID 2021   mild case   Dysrhythmia    Aflutter   Heart failure with reduced ejection fraction (HCC)    Pt denies, it is in cardiologist notes.   Heart murmur    Hypertension    Hypoglycemia    occ   NICM (nonischemic cardiomyopathy) (HCC) 02/12/2020   Prostate cancer (HCC) 2019   Rheumatoid arthritis [facility])     Past Surgical History:  Procedure Laterality Date   AMPUTATION Right 02/10/2022   Procedure: RIGHT TRANSMETATARSAL AMPUTATION;  Surgeon: Nadara Mustard, MD;  Location: [facility] OR;  Service: Orthopedics;  Laterality: Right;   AMPUTATION Right 03/10/2022   Procedure: RIGHT FOOT CHOPART AMPUTATION;  Surgeon: Nadara Mustard, MD;  Location: [facility] OR;  Service: Orthopedics;  Laterality: Right;   AMPUTATION Left 08/16/2022   Procedure: LEFT 3RD TOE AMPUTATION;  Surgeon: Nadara Mustard, MD;  Location: [facility] OR;  Service: Orthopedics;  Laterality: Left;   AMPUTATION TOE Right 03/26/2020   Procedure: Right 3rd toe partial amputation, bone biopsy right 1st metatarsal;  Surgeon: Park Liter, DPM;  Location: [facility] ORS;  Service: Podiatry;  Laterality: Right;   BUBBLE STUDY  02/11/2020   Procedure: BUBBLE STUDY;  Surgeon: Sande Rives, MD;  Location: [facility] ENDOSCOPY;  Service: Cardiovascular;;   CARDIOVERSION N/A 02/11/2020   Procedure: CARDIOVERSION;  Surgeon: Sande Rives, MD;  Location: [facility] ENDOSCOPY;  Service: Cardiovascular;  Laterality: N/A;   CARDIOVERSION N/A 08/31/2021   Procedure: CARDIOVERSION;  Surgeon: Jake Bathe, MD;  Location: [facility] ENDOSCOPY;   Service: Cardiovascular;  Laterality: N/A;   CYSTOSCOPY  04/11/2018   Procedure: Derinda Late;  Surgeon: Crist Fat, MD;  Location: [facility];  Service: Urology;;  NO SEEDS FOUND IN BLADDER   HERNIA REPAIR  2009   inguinal   INTRAMEDULLARY (IM) NAIL INTERTROCHANTERIC Right 05/05/2020   Procedure: CPT 27245-Cephalomedullary nailing of right intertrochanteric femur fracture;  Surgeon: Roby Lofts, MD;  Location: [facility] OR;  Service: Orthopedics;  Laterality: Right;   INTRAMEDULLARY (IM) NAIL INTERTROCHANTERIC Right 12/24/2020   Procedure: REVISION FIXATION OF RIGHT INTERTROCHANTERIC FEMUR NONUNION;  Surgeon: Roby Lofts, MD;  Location: [facility] OR;  Service: Orthopedics;  Laterality: Right;   IRRIGATION AND DEBRIDEMENT FOOT Left 03/26/2020   Procedure: Left foot incision and drainage with removal of all non-viable soft tissue and bone - areas overlying the 2nd and 5th metatarsals.;  Surgeon: Park Liter, DPM;  Location: [facility] ORS;  Service: Podiatry;  Laterality: Left;   RADIOACTIVE SEED IMPLANT N/A 04/11/2018   Procedure: RADIOACTIVE SEED IMPLANT/BRACHYTHERAPY IMPLANT;  Surgeon: Crist Fat, MD;  Location: [facility];  Service: Urology;  Laterality: N/A;   71     SEEDS IMPLANTED   RIGHT/LEFT HEART CATH AND CORONARY ANGIOGRAPHY N/A 02/09/2020   Procedure: RIGHT/LEFT HEART CATH AND CORONARY ANGIOGRAPHY;  Surgeon: Yvonne Kendall, MD;  Location: [facility] INVASIVE CV LAB;  Service: Cardiovascular;  Laterality: N/A;   SPACE OAR INSTILLATION N/A 04/11/2018   Procedure: SPACE OAR INSTILLATION;  Surgeon: Crist Fat, MD;  Location: [facility];  Service: Urology;  Laterality: N/A;   TEE WITHOUT CARDIOVERSION N/A 02/11/2020   Procedure: TRANSESOPHAGEAL ECHOCARDIOGRAM (TEE);  Surgeon: Sande Rives, MD;  Location: [facility] ENDOSCOPY;  Service: Cardiovascular;  Laterality: N/A;   TOTAL KNEE ARTHROPLASTY Bilateral 09/13/2015    Procedure: BILATERAL KNEE ARTHROPLASTY ;  Surgeon: Durene Romans, MD;  Location: [facility] ORS;  Service: Orthopedics;  Laterality: Bilateral;    Family History  Problem Relation Age of Onset   Prostate cancer Father    Prostate cancer Brother    Colon cancer Neg Hx    Rectal cancer Neg Hx    Stomach cancer Neg Hx    Social History:  reports that he has never smoked. He has never used smokeless tobacco. He reports that he does not currently use alcohol after a past usage of about 7.0 standard drinks of alcohol per week. He reports that he does not currently use drugs.  Allergies:  Allergies  Allergen Reactions   Cefadroxil Hives and Palpitations    EMS to ED from [facility].   Diltiazem Hcl Swelling   Chlorthalidone Other (See Comments)    "Makes me light-headed and I don't like the way it makes me feel"   Voltaren [Diclofenac] Rash    Medications Prior to Admission  Medication Sig Dispense Refill   amiodarone (PACERONE) 100 MG tablet Take 1 tablet (100 mg total) by mouth daily. 90 tablet 1   apixaban (ELIQUIS) 5 MG TABS tablet Take 1 tablet (5 mg total) by mouth 2 (two) times daily. 180 tablet 1   diazepam (VALIUM) 5 MG tablet 5 mg at bedtime as needed for muscle spasms (To relax prostate).     DULoxetine (CYMBALTA) 60 MG capsule TAKE 1 CAPSULE BY MOUTH EVERY DAY - 90 capsule 1   lisinopril (ZESTRIL) 10 MG tablet Take 1 tablet (10 mg total) by mouth daily. (Patient taking differently: Take 20 mg by mouth every evening.) 90 tablet 3   metoprolol succinate (TOPROL XL) 25 MG 24 hr tablet Take 1 tablet (25 mg total) by mouth daily. (Patient taking differently: Take 12.5 mg by mouth daily.) 90 tablet 3   NARCAN 4 MG/0.1ML LIQD nasal spray kit Place 1 spray into the nose as needed (as directed for emergency). 2 each 0   oxycodone (ROXICODONE) 30 MG immediate release tablet Take 1 tablet (30 mg total) by mouth every 4 (four) hours as needed for pain. 30 tablet 0   silver sulfADIAZINE (SILVADENE) 1 %  cream APPLY 1 APPLICATION TOPICALLY DAILY (Patient taking differently: Apply 1 Application topically 2 (two) times daily.) 400 g 0   sulfamethoxazole-trimethoprim (BACTRIM DS) 800-160 MG tablet TAKE 1 TABLET BY MOUTH TWICE A DAY 30 tablet 1   tamsulosin (FLOMAX) 0.4 MG CAPS capsule Take 0.4 mg by mouth daily as needed (prostate spasms).     testosterone cypionate (DEPOTESTOSTERONE CYPIONATE) 200 MG/ML injection Inject 100 mg into the muscle once a week.     torsemide (DEMADEX) 20 MG tablet Take 20 mg by mouth daily.      No results found for this or any previous visit (from the past 48 hour(s)). No results found.  Review of Systems  All other systems reviewed and are negative.   Blood pressure (!) 158/86, pulse (!) 58, temperature 98 F (36.7 C), resp. rate 18, height 6\' 8"  (2.032 m), weight 97.5 kg, SpO2 99 %. Physical Exam  On examination patient is alert oriented no adenopathy  well-dressed normal affect normal respiratory effort.  Patient by Doppler has a strong biphasic dorsalis pedis and posterior tibial pulse.  Patient has new ulceration across the forefoot. Assessment/Plan Assessment: Dehiscence left third ray amputation.  Plan: Will plan for left transmetatarsal amputation.  Nadara Mustard, MD 09/13/2022, 6:41 AM

## 2022-09-14 ENCOUNTER — Encounter: Payer: Self-pay | Admitting: Orthopedic Surgery

## 2022-09-14 ENCOUNTER — Other Ambulatory Visit: Payer: Self-pay

## 2022-09-14 ENCOUNTER — Encounter (HOSPITAL_COMMUNITY): Payer: Self-pay | Admitting: Orthopedic Surgery

## 2022-09-14 ENCOUNTER — Other Ambulatory Visit: Payer: Self-pay | Admitting: Family

## 2022-09-14 ENCOUNTER — Ambulatory Visit (INDEPENDENT_AMBULATORY_CARE_PROVIDER_SITE_OTHER): Payer: Medicare Other | Admitting: Orthopedic Surgery

## 2022-09-14 DIAGNOSIS — T8781 Dehiscence of amputation stump: Secondary | ICD-10-CM

## 2022-09-14 NOTE — Anesthesia Preprocedure Evaluation (Addendum)
Anesthesia Evaluation  Patient identified by MRN, date of birth, ID band Patient awake    Reviewed: Allergy & Precautions, NPO status , Patient's Chart, lab work & pertinent test results  Airway Mallampati: II  TM Distance: >3 FB Neck ROM: Full    Dental no notable dental hx.    Pulmonary neg pulmonary ROS   Pulmonary exam normal        Cardiovascular hypertension, Pt. on medications and Pt. on home beta blockers +CHF  + dysrhythmias Atrial Fibrillation  Rhythm:Regular Rate:Normal  CV: Echo 11/10/2021  1. Limited echo for LV function   2. Left ventricular ejection fraction, by estimation, is 55 to 60%. Left  ventricular ejection fraction by 3D volume is 56 %. The left ventricle has  normal function. The left ventricle has no regional wall motion  abnormalities. There is mild left  ventricular hypertrophy. Left ventricular diastolic parameters are  consistent with Grade I diastolic dysfunction (impaired relaxation).   3. The aortic valve is tricuspid. Aortic valve regurgitation is not  visualized.   4. Aortic dilatation noted. There is mild dilatation of the aortic root,  measuring 41 mm.  (Comparison LVEF < 20% 02/02/20; 10-15% 02/11/20; 45-50% 05/06/20; LVEF 55-60% 08/27/20, mild LVH, grade 1 DD, normal RVSF, normal PASP, trivial MR/AR, aortic root 41 mm)      Neuro/Psych   Anxiety     negative neurological ROS     GI/Hepatic negative GI ROS, Neg liver ROS,,,  Endo/Other  negative endocrine ROS    Renal/GU negative Renal ROS  negative genitourinary   Musculoskeletal  (+) Arthritis , Osteoarthritis,    Abdominal Normal abdominal exam  (+)   Peds  Hematology  (+) Blood dyscrasia, anemia   Anesthesia Other Findings   Reproductive/Obstetrics                             Anesthesia Physical Anesthesia Plan  ASA: 3  Anesthesia Plan: MAC and Regional   Post-op Pain Management: Regional  block*   Induction: Intravenous  PONV Risk Score and Plan: 1 and Ondansetron, Dexamethasone, Propofol infusion, Treatment may vary due to age or medical condition and Midazolam  Airway Management Planned: Simple Face Mask and Nasal Cannula  Additional Equipment: None  Intra-op Plan:   Post-operative Plan:   Informed Consent: I have reviewed the patients History and Physical, chart, labs and discussed the procedure including the risks, benefits and alternatives for the proposed anesthesia with the patient or authorized representative who has indicated his/her understanding and acceptance.     Dental advisory given  Plan Discussed with: CRNA  Anesthesia Plan Comments: (PAT note written 09/14/2022 by Shonna Chock, PA-C.  )       Anesthesia Quick Evaluation

## 2022-09-14 NOTE — Progress Notes (Signed)
Office Visit Note   Patient: Thomas [name]           Date of Birth: [birth year]           MRN: [id number] Visit Date: 09/14/2022              Requested by: Carney Living, MD [address] [place],  [place] [address] PCP: Carney Living, MD  Chief Complaint  Patient presents with   Left Foot - Routine Post Op    09/13/2022 left TMA s/p fall this morning 1 hour ago "wound vac ripped off"       HPI: Patient is a 72 year old gentleman who is 1 day status post left transmetatarsal amputation with a wound VAC.  Patient states that he was navigating with his kneeling scooter when he struck the rug and fell directly on the residual limb.  Assessment & Plan: Visit Diagnoses:  1. Dehiscence of amputation stump of left lower extremity (HCC)     Plan: With the extensive wound dehiscence will need to proceed with debridement of the wound revision of the amputation and wound closure.  Patient states that he would prefer to discharge to home after therapy.  Plan for surgery tomorrow.  Follow-Up Instructions: Return in about 1 week (around 09/21/2022).   Ortho Exam  Patient is alert, oriented, no adenopathy, well-dressed, normal affect, normal respiratory effort. Examination there is large dehiscence of the lateral aspect of the left transmetatarsal amputation.  Patient's Eliquis has been on hold and he states that he did not take it today.  With the extent of the wound dehiscence I do not feel this would heal with conservative wound care and will plan for revision surgery.  Imaging: No results found.    Labs: Lab Results  Component Value Date   HGBA1C 6.0 (H) 09/05/2022   HGBA1C 6.2 (H) 02/03/2020   HGBA1C 5.3 10/28/2015   ESRSEDRATE 14 06/21/2021   ESRSEDRATE 17 05/18/2021   ESRSEDRATE 20 (H) 03/28/2020   CRP 19.2 (H) 06/21/2021   CRP 11.1 (H) 05/18/2021   CRP 2.7 (H) 03/28/2020   REPTSTATUS 03/31/2020 FINAL 03/26/2020   GRAMSTAIN NO WBC SEEN NO  ORGANISMS SEEN  03/26/2020   CULT  03/26/2020    No growth aerobically or anaerobically. Performed at [facility], [address] [place], [place] [address]    [facility] STAPHYLOCOCCUS AUREUS 03/26/2020     Lab Results  Component Value Date   ALBUMIN 4.0 06/30/2022   ALBUMIN 3.0 (L) 03/26/2020   ALBUMIN 3.6 03/25/2020    Lab Results  Component Value Date   MG 1.8 05/22/2021   MG 2.0 02/12/2020   MG 1.7 02/11/2020   Lab Results  Component Value Date   VD25OH 28.61 (L) 05/05/2020    No results found for: "PREALBUMIN"    Latest Ref Rng & Units 08/16/2022    6:24 AM 03/10/2022   10:10 AM 02/03/2022    2:24 PM  CBC EXTENDED  WBC 4.0 - 10.5 K/uL 4.0  5.1  10.6   RBC 4.22 - 5.81 MIL/uL 4.29  4.80  4.84   Hemoglobin 13.0 - 17.0 g/dL 86.5  78.4  69.6   HCT 39.0 - 52.0 % 34.9  37.4  39.6   Platelets 150 - 400 K/uL 249  370  311      There is no height or weight on file to calculate BMI.  Orders:  No orders of the defined types were placed in  this encounter.  No orders of the defined types were placed in this encounter.    Procedures: No procedures performed  Clinical Data: No additional findings.  ROS:  All other systems negative, except as noted in the HPI. Review of Systems  Objective: Vital Signs: There were no vitals taken for this visit.  Specialty Comments:  No specialty comments available.  PMFS History: Patient Active Problem List   Diagnosis Date Noted   Osteomyelitis of third toe of left foot (HCC) 08/16/2022   Depressed mood 06/20/2022   Dehiscence of amputation stump of left lower extremity (HCC) 03/10/2022   History of transmetatarsal amputation of right foot (HCC) 02/15/2022   Chronic osteomyelitis of toe, right (HCC) 02/10/2022   Fracture of intertrochanteric section of femur, closed, right, with nonunion, subsequent encounter 12/24/2020   Inguinal hernia 10/18/2020   Chronic combined systolic and diastolic CHF, NYHA class 2  (HCC)    Osteoporosis 05/05/2020   Foot infection    NICM (nonischemic cardiomyopathy) (HCC), no significant CAD on cardiac cath   02/12/2020   Right foot pain    Rheumatoid arthritis (HCC)    Atrial flutter (HCC) 02/02/2020   Lower extremity edema 12/30/2019   Weight loss, non-intentional 11/24/2019   Chronic right hip pain 06/19/2019   Degenerative tear of acetabular labrum of right hip 05/02/2019   Primary osteoarthritis of right hip 05/02/2019   Skin ulcer (HCC) 01/29/2019   Malignant neoplasm of prostate (HCC) 01/16/2018   Insomnia 10/02/2017   S/P bilateral TKA 09/13/2015   Other bilateral secondary osteoarthritis of knee 07/14/2014   Heme positive stool 05/21/2013   Osteoarthritis, multiple sites 01/13/2011   Hypertension 12/06/2010   CLAUSTROPHOBIA 03/01/2010   Past Medical History:  Diagnosis Date   Anemia    Atrial flutter (HCC) 01/2020   Cancer (HCC)    prostate   COVID 2021   mild case   Dysrhythmia    Aflutter   Heart failure with reduced ejection fraction (HCC)    Pt denies, it is in cardiologist notes.   Heart murmur    Hypertension    Hypoglycemia    occ   NICM (nonischemic cardiomyopathy) (HCC) 02/12/2020   Prostate cancer (HCC) 2019   Rheumatoid arthritis (HCC)     Family History  Problem Relation Age of Onset   Prostate cancer Father    Prostate cancer Brother    Colon cancer Neg Hx    Rectal cancer Neg Hx    Stomach cancer Neg Hx     Past Surgical History:  Procedure Laterality Date   AMPUTATION Right 02/10/2022   Procedure: RIGHT TRANSMETATARSAL AMPUTATION;  Surgeon: Nadara Mustard, MD;  Location: [facility] OR;  Service: Orthopedics;  Laterality: Right;   AMPUTATION Right 03/10/2022   Procedure: RIGHT FOOT CHOPART AMPUTATION;  Surgeon: Nadara Mustard, MD;  Location: [facility] OR;  Service: Orthopedics;  Laterality: Right;   AMPUTATION Left 08/16/2022   Procedure: LEFT 3RD TOE AMPUTATION;  Surgeon: Nadara Mustard, MD;  Location: [facility] OR;  Service:  Orthopedics;  Laterality: Left;   AMPUTATION TOE Right 03/26/2020   Procedure: Right 3rd toe partial amputation, bone biopsy right 1st metatarsal;  Surgeon: Park Liter, DPM;  Location: [facility] ORS;  Service: Podiatry;  Laterality: Right;   BUBBLE STUDY  02/11/2020   Procedure: BUBBLE STUDY;  Surgeon: Sande Rives, MD;  Location: [facility] ENDOSCOPY;  Service: Cardiovascular;;   CARDIOVERSION N/A 02/11/2020   Procedure: CARDIOVERSION;  Surgeon: Sande Rives, MD;  Location: [facility]  ENDOSCOPY;  Service: Cardiovascular;  Laterality: N/A;   CARDIOVERSION N/A 08/31/2021   Procedure: CARDIOVERSION;  Surgeon: Jake Bathe, MD;  Location: [facility] ENDOSCOPY;  Service: Cardiovascular;  Laterality: N/A;   CYSTOSCOPY  04/11/2018   Procedure: Derinda Late;  Surgeon: Crist Fat, MD;  Location: [facility];  Service: Urology;;  NO SEEDS FOUND IN BLADDER   HERNIA REPAIR  2009   inguinal   INTRAMEDULLARY (IM) NAIL INTERTROCHANTERIC Right 05/05/2020   Procedure: CPT 27245-Cephalomedullary nailing of right intertrochanteric femur fracture;  Surgeon: Roby Lofts, MD;  Location: [facility] OR;  Service: Orthopedics;  Laterality: Right;   INTRAMEDULLARY (IM) NAIL INTERTROCHANTERIC Right 12/24/2020   Procedure: REVISION FIXATION OF RIGHT INTERTROCHANTERIC FEMUR NONUNION;  Surgeon: Roby Lofts, MD;  Location: [facility] OR;  Service: Orthopedics;  Laterality: Right;   IRRIGATION AND DEBRIDEMENT FOOT Left 03/26/2020   Procedure: Left foot incision and drainage with removal of all non-viable soft tissue and bone - areas overlying the 2nd and 5th metatarsals.;  Surgeon: Park Liter, DPM;  Location: [facility]S;  Service: Podiatry;  Laterality: Left;   RADIOACTIVE SEED IMPLANT N/A 04/11/2018   Procedure: RADIOACTIVE SEED IMPLANT/BRACHYTHERAPY IMPLANT;  Surgeon: Crist Fat, MD;  Location: [facility];  Service: Urology;  Laterality: N/A;   71     SEEDS IMPLANTED   RIGHT/LEFT  HEART CATH AND CORONARY ANGIOGRAPHY N/A 02/09/2020   Procedure: RIGHT/LEFT HEART CATH AND CORONARY ANGIOGRAPHY;  Surgeon: Yvonne Kendall, MD;  [facility] INVASIVE CV LAB;  Service: Cardiovascular;  Laterality: N/A;   SPACE OAR INSTILLATION N/A 04/11/2018   Procedure: SPACE OAR INSTILLATION;  Surgeon: Crist Fat, MD;  Location: [facility];  Service: Urology;  Laterality: N/A;   TEE WITHOUT CARDIOVERSION N/A 02/11/2020   Procedure: TRANSESOPHAGEAL ECHOCARDIOGRAM (TEE);  Surgeon: Sande Rives, MD;  Location: [facility] ENDOSCOPY;  Service: Cardiovascular;  Laterality: N/A;   TOTAL KNEE ARTHROPLASTY Bilateral 09/13/2015   Procedure: BILATERAL KNEE ARTHROPLASTY ;  Surgeon: Durene Romans, MD;  Location: [facility]S;  Service: Orthopedics;  Laterality: Bilateral;   Social History   Occupational History   Not on file  Tobacco Use   Smoking status: Never   Smokeless tobacco: Never  Vaping Use   Vaping Use: Never used  Substance and Sexual Activity   Alcohol use: Not Currently    Alcohol/week: 7.0 standard drinks of alcohol    Types: 7 Cans of beer per week   Drug use: Not Currently   Sexual activity: Yes

## 2022-09-14 NOTE — Progress Notes (Signed)
SDW call  Patient was given pre-op instructions over the phone. Patient verbalized understanding of instructions provided.     PCP - Dr. Pearlean Brownie Cardiologist - Dr. Laurance Flatten Pulmonary:    PPM/ICD - Denies  Chest x-ray - n/a EKG -  10/04/2021 Stress Test - ECHO - 11/10/2021 Cardiac Cath - 02/09/2020  Sleep Study/sleep apnea/CPAP: Denies  Non-diabetic  Blood Thinner Instructions: Eliquis, last dose 09/10/2022 Aspirin Instructions: Denies   ERAS Protcol - Yes, clear liquids until 0930 PRE-SURGERY Ensure or G2-    COVID TEST- n/a    Anesthesia review: Yes. HTN, A-flutter, heart murmur, cardiomyopathy, heart caths, cardioversions   Patient denies shortness of breath, fever, cough and chest pain over the phone call  Your procedure is scheduled on Friday Sep 14, 2022  Report to [facility] Main Entrance "A" at  1000  A.M., then check in with the Admitting office.  Call this number if you have problems the morning of surgery:  [phone]   If you have any questions prior to your surgery date call [phone]: Open Monday-Friday 8am-4pm If you experience any cold or flu symptoms such as cough, fever, chills, shortness of breath, etc. between now and your scheduled surgery, please notify us at the above number     Remember:  Do not eat after midnight the night before your surgery  You may drink clear liquids until 0930    the morning of your surgery.   Clear liquids allowed are: Water, Non-Citrus Juices (without pulp), Carbonated Beverages, Clear Tea, Black Coffee ONLY (NO MILK, CREAM OR POWDERED CREAMER of any kind), and Gatorade   Take these medicines the morning of surgery with A SIP OF WATER:  Amiodarone, duloxetine, metaprolol  As needed:   As of today, STOP taking any Aspirin (unless otherwise instructed by your surgeon) Aleve, Naproxen, Ibuprofen, Motrin, Advil, Goody's, BC's, all herbal medications, fish oil, and all vitamins.

## 2022-09-14 NOTE — Progress Notes (Signed)
Anesthesia Chart Review: Thomas [name]  Case: [id number] Date/Time: 09/15/22 1224   Procedure: REVISION LEFT TRANSMETATARSAL AMPUTATION (Left)   Anesthesia type: Choice   Pre-op diagnosis: Dehiscence Left Transmetatarsal Amputation   [facility] ROOM 06 / [facility]   Surgeons: Nadara Mustard, MD       DISCUSSION: Patient is a 72 year old male scheduled for the above procedure. S/p left 3rd toe amputation 08/16/22, followed by left transmetatarsal amputation 09/13/22. He was evaluated by Dr. Lajoyce Corners on 09/14/22 for extensive wound dehiscence that occurred after falling while trying to navigate with his kneeling scooter.     History includes never smoker, HTN, atrial flutter, (s/p DCCV 02/11/20, 08/31/21), nonischemic cardiomyopathy (no significant CAD 01/2020), CHF (EF < 20% 01/2020, 55-60% 08/2020 & 10/2021), RA, prostate cancer (s/p radioactive seed implant 04/11/18), TKA (left 09/13/15), osteomyelitis (s/p right 3rd toe amputation; right TMA 02/10/22, right foot Chopart amputation 03/10/22; left 3rd toe amputation 08/16/22, left TMA 09/13/22), right femur fracture (s/p cephalomedullary nailing 05/04/20; removal of right femur hardware, revision fixation for femoral non-union 12/24/20), hernia (right IHR 12/07/20).    Follows with cardiologist Dr. Shari Prows for history of NICM with reduced EF (now recovered, 55 to 60% by echo 10/2021), HTN, recurrent atrial flutter s/p multiple DCCV.  Last visit on 06/30/22 with Robin Searing, NP for routine 3 month follow-up.He was felt to be doing well from a CV standpoint. Euvolemic on exam. Maintaining SR. No new testing ordered. Follow-up in 6 months planned. He is no longer taking amiodarone per his preference. He is on Toprol and Eliquis.    Anesthesia team to evaluate on the day of surgery. Defer perioperative Eliquis instructions to surgeon--he was given permission by cardiology to temporarily hold for his previous surgery. He reported last dose 09/10/22.    VS:  BP  Readings from Last 3 Encounters:  09/13/22 (!) 150/90  08/16/22 120/76  06/30/22 108/60   Pulse Readings from Last 3 Encounters:  09/13/22 (!) 56  08/16/22 (!) 53  06/30/22 67    PROVIDERS: Carney Living, MD is PCP  Laurance Flatten, MD is Cardiologist  Alben Deeds, MD is rheumatologist   LABS: Last lab results in [facility] include: Lab Results  Component Value Date   WBC 4.0 08/16/2022   HGB 10.9 (L) 08/16/2022   HCT 34.9 (L) 08/16/2022   PLT 249 08/16/2022   GLUCOSE 94 08/16/2022   ALT 15 06/30/2022   AST 19 06/30/2022   NA 136 08/16/2022   K 4.4 08/16/2022   CL 101 08/16/2022   CREATININE 1.02 08/16/2022   BUN 19 08/16/2022   CO2 23 08/16/2022   TSH 6.260 (H) 06/30/2022    IMAGES: CT Right Hip 08/29/22: IMPRESSION: 1. Stable appearance and position of the intramedullary gamma nail with 2 proximal dynamic hip screws and a distal interlocking screw. No complicating features such as loosening or fracture or AVN. 2. The intertrochanteric fracture is now completely healed. 3. Stable moderate right hip joint degenerative changes.    EKG: 10/04/2021: Sinus rhythm.  Rate 64.      CV: Echo 11/10/2021  1. Limited echo for LV function   2. Left ventricular ejection fraction, by estimation, is 55 to 60%. Left  ventricular ejection fraction by 3D volume is 56 %. The left ventricle has  normal function. The left ventricle has no regional wall motion  abnormalities. There is mild left  ventricular hypertrophy. Left ventricular diastolic parameters are  consistent with Grade I diastolic dysfunction (impaired relaxation).  3. The aortic valve is tricuspid. Aortic valve regurgitation is not  visualized.   4. Aortic dilatation noted. There is mild dilatation of the aortic root,  measuring 41 mm.  (Comparison LVEF < 20% 02/02/20; 10-15% 02/11/20; 45-50% 05/06/20; LVEF 55-60% 08/27/20, mild LVH, grade 1 DD, normal RVSF, normal PASP, trivial MR/AR, aortic root 41 mm)      Cardiac Cath 02/09/2020 Conclusions: No angiographically significant coronary artery disease. Upper normal left heart, right heart, and pulmonary artery pressures. Mildly reduced Fick cardiac output/index.   Recommendations: Restart heparin infusion 2 hours after TR band removal.  If there is no evidence of bleeding/vascular complication, consider transitioning back to apixaban as soon as tomorrow. Proceed with TEE/cardioversion as soon as tomorrow.  I will make Mr. Riopelle NPO after midnight in anticipation of this. Optimize evidence-based heart failure therapy for non-ischemic cardiomyopathy. Primary prevention of coronary artery disease.    Past Medical History:  Diagnosis Date   Anemia    Atrial flutter (HCC) 01/2020   Cancer (HCC)    prostate   COVID 2021   mild case   Dysrhythmia    Aflutter   Heart failure with reduced ejection fraction (HCC)    Pt denies, it is in cardiologist notes.   Heart murmur    Hypertension    Hypoglycemia    occ   NICM (nonischemic cardiomyopathy) (HCC) 02/12/2020   Prostate cancer (HCC) 2019   Rheumatoid arthritis [facility])     Past Surgical History:  Procedure Laterality Date   AMPUTATION Right 02/10/2022   Procedure: RIGHT TRANSMETATARSAL AMPUTATION;  Surgeon: Nadara Mustard, MD;  Location: [facility] OR;  Service: Orthopedics;  Laterality: Right;   AMPUTATION Right 03/10/2022   Procedure: RIGHT FOOT CHOPART AMPUTATION;  Surgeon: Nadara Mustard, MD;  Location: [facility] OR;  Service: Orthopedics;  Laterality: Right;   AMPUTATION Left 08/16/2022   Procedure: LEFT 3RD TOE AMPUTATION;  Surgeon: Nadara Mustard, MD;  Location: [facility] OR;  Service: Orthopedics;  Laterality: Left;   AMPUTATION TOE Right 03/26/2020   Procedure: Right 3rd toe partial amputation, bone biopsy right 1st metatarsal;  Surgeon: Park Liter, DPM;  Location: [facility] ORS;  Service: Podiatry;  Laterality: Right;   BUBBLE STUDY  02/11/2020   Procedure: BUBBLE STUDY;  Surgeon: Sande Rives, MD;  Location: [facility] ENDOSCOPY;  Service: Cardiovascular;;   CARDIOVERSION N/A 02/11/2020   Procedure: CARDIOVERSION;  Surgeon: Sande Rives, MD;  Location: [facility] ENDOSCOPY;  Service: Cardiovascular;  Laterality: N/A;   CARDIOVERSION N/A 08/31/2021   Procedure: CARDIOVERSION;  Surgeon: Jake Bathe, MD;  Location: [facility] ENDOSCOPY;  Service: Cardiovascular;  Laterality: N/A;   CYSTOSCOPY  04/11/2018   Procedure: Derinda Late;  Surgeon: Crist Fat, MD;  Location: [facility];  Service: Urology;;  NO SEEDS FOUND IN BLADDER   HERNIA REPAIR  2009   inguinal   INTRAMEDULLARY (IM) NAIL INTERTROCHANTERIC Right 05/05/2020   Procedure: CPT 27245-Cephalomedullary nailing of right intertrochanteric femur fracture;  Surgeon: Roby Lofts, MD;  Location: [facility] OR;  Service: Orthopedics;  Laterality: Right;   INTRAMEDULLARY (IM) NAIL INTERTROCHANTERIC Right 12/24/2020   Procedure: REVISION FIXATION OF RIGHT INTERTROCHANTERIC FEMUR NONUNION;  Surgeon: Roby Lofts, MD;  Location: [facility] OR;  Service: Orthopedics;  Laterality: Right;   IRRIGATION AND DEBRIDEMENT FOOT Left 03/26/2020   Procedure: Left foot incision and drainage with removal of all non-viable soft tissue and bone - areas overlying the 2nd and 5th metatarsals.;  Surgeon: Park Liter, DPM;  Location: [facility] ORS;  Service: Podiatry;  Laterality: Left;   RADIOACTIVE SEED IMPLANT N/A 04/11/2018   Procedure: RADIOACTIVE SEED IMPLANT/BRACHYTHERAPY IMPLANT;  Surgeon: Crist Fat, MD;  Location: [facility];  Service: Urology;  Laterality: N/A;   71     SEEDS IMPLANTED   RIGHT/LEFT HEART CATH AND CORONARY ANGIOGRAPHY N/A 02/09/2020   Procedure: RIGHT/LEFT HEART CATH AND CORONARY ANGIOGRAPHY;  Surgeon: Yvonne Kendall, MD;  [facility] INVASIVE CV LAB;  Service: Cardiovascular;  Laterality: N/A;   SPACE OAR INSTILLATION N/A 04/11/2018   Procedure: SPACE OAR INSTILLATION;  Surgeon: Crist Fat, MD;  Location: [facility];  Service: Urology;  Laterality: N/A;   TEE WITHOUT CARDIOVERSION N/A 02/11/2020   Procedure: TRANSESOPHAGEAL ECHOCARDIOGRAM (TEE);  Surgeon: Sande Rives, MD;  Location: [facility] ENDOSCOPY;  Service: Cardiovascular;  Laterality: N/A;   TOTAL KNEE ARTHROPLASTY Bilateral 09/13/2015   Procedure: BILATERAL KNEE ARTHROPLASTY ;  Surgeon: Durene Romans, MD;  Location: [facility] ORS;  Service: Orthopedics;  Laterality: Bilateral;    MEDICATIONS: No current facility-administered medications for this encounter.    amiodarone (PACERONE) 100 MG tablet   apixaban (ELIQUIS) 5 MG TABS tablet   diazepam (VALIUM) 5 MG tablet   DULoxetine (CYMBALTA) 60 MG capsule   lisinopril (ZESTRIL) 10 MG tablet   metoprolol succinate (TOPROL XL) 25 MG 24 hr tablet   NARCAN 4 MG/0.1ML LIQD nasal spray kit   oxycodone (ROXICODONE) 30 MG immediate release tablet   oxycodone (ROXICODONE) 30 MG immediate release tablet   sulfamethoxazole-trimethoprim (BACTRIM DS) 800-160 MG tablet   tamsulosin (FLOMAX) 0.4 MG CAPS capsule   testosterone cypionate (DEPOTESTOSTERONE CYPIONATE) 200 MG/ML injection   torsemide (DEMADEX) 20 MG tablet    Shonna Chock, PA-C Surgical Short Stay/Anesthesiology [facility] Phone [phone] [facility] Phone [phone] 09/14/2022 1:25 PM

## 2022-09-15 ENCOUNTER — Inpatient Hospital Stay (HOSPITAL_COMMUNITY): Payer: Medicare Other | Admitting: Vascular Surgery

## 2022-09-15 ENCOUNTER — Other Ambulatory Visit: Payer: Self-pay

## 2022-09-15 ENCOUNTER — Encounter (HOSPITAL_COMMUNITY): Payer: Self-pay | Admitting: Orthopedic Surgery

## 2022-09-15 ENCOUNTER — Encounter (HOSPITAL_COMMUNITY)
Admission: RE | Disposition: A | Discharge status: Home / Self Care | Payer: Self-pay | Source: Home / Self Care | Attending: Orthopedic Surgery

## 2022-09-15 ENCOUNTER — Encounter: Payer: Self-pay | Admitting: Orthopedic Surgery

## 2022-09-15 ENCOUNTER — Telehealth: Payer: Self-pay | Admitting: Orthopedic Surgery

## 2022-09-15 ENCOUNTER — Ambulatory Visit (HOSPITAL_COMMUNITY)
Admission: RE | Admit: 2022-09-15 | Discharge: 2022-09-15 | Disposition: A | Discharge status: Home / Self Care | Payer: Medicare Other | Attending: Orthopedic Surgery | Admitting: Orthopedic Surgery

## 2022-09-15 DIAGNOSIS — I509 Heart failure, unspecified: Secondary | ICD-10-CM | POA: Insufficient documentation

## 2022-09-15 DIAGNOSIS — I110 Hypertensive heart disease with heart failure: Secondary | ICD-10-CM | POA: Diagnosis not present

## 2022-09-15 DIAGNOSIS — E1169 Type 2 diabetes mellitus with other specified complication: Secondary | ICD-10-CM

## 2022-09-15 DIAGNOSIS — M1611 Unilateral primary osteoarthritis, right hip: Secondary | ICD-10-CM | POA: Insufficient documentation

## 2022-09-15 DIAGNOSIS — T8131XA Disruption of external operation (surgical) wound, not elsewhere classified, initial encounter: Secondary | ICD-10-CM | POA: Diagnosis present

## 2022-09-15 DIAGNOSIS — M869 Osteomyelitis, unspecified: Secondary | ICD-10-CM

## 2022-09-15 DIAGNOSIS — W19XXXA Unspecified fall, initial encounter: Secondary | ICD-10-CM | POA: Diagnosis not present

## 2022-09-15 DIAGNOSIS — L7632 Postprocedural hematoma of skin and subcutaneous tissue following other procedure: Secondary | ICD-10-CM

## 2022-09-15 DIAGNOSIS — M069 Rheumatoid arthritis, unspecified: Secondary | ICD-10-CM | POA: Diagnosis not present

## 2022-09-15 DIAGNOSIS — T8781 Dehiscence of amputation stump: Secondary | ICD-10-CM | POA: Diagnosis not present

## 2022-09-15 DIAGNOSIS — Y92009 Unspecified place in unspecified non-institutional (private) residence as the place of occurrence of the external cause: Secondary | ICD-10-CM | POA: Insufficient documentation

## 2022-09-15 DIAGNOSIS — I4891 Unspecified atrial fibrillation: Secondary | ICD-10-CM | POA: Diagnosis not present

## 2022-09-15 HISTORY — PX: STUMP REVISION: SHX6102

## 2022-09-15 SURGERY — REVISION, AMPUTATION SITE
Anesthesia: Monitor Anesthesia Care | Laterality: Left

## 2022-09-15 MED ORDER — FENTANYL CITRATE (PF) 100 MCG/2ML IJ SOLN: 25.0000 ug | INTRAMUSCULAR | Status: DC | PRN

## 2022-09-15 MED ORDER — PROPOFOL 10 MG/ML IV BOLUS
INTRAVENOUS | Status: DC | PRN
  Administered 2022-09-15: 30 mg via INTRAVENOUS
  Administered 2022-09-15: 10 mg via INTRAVENOUS
  Administered 2022-09-15 (×2): 20 mg via INTRAVENOUS
  Administered 2022-09-15: 30 mg via INTRAVENOUS
  Administered 2022-09-15: 40 mg via INTRAVENOUS
  Administered 2022-09-15 (×2): 10 mg via INTRAVENOUS
  Administered 2022-09-15: 20 mg via INTRAVENOUS
  Administered 2022-09-15: 10 mg via INTRAVENOUS

## 2022-09-15 MED ORDER — 0.9 % SODIUM CHLORIDE (POUR BTL) OPTIME
TOPICAL | Status: DC | PRN
  Administered 2022-09-15: 1000 mL

## 2022-09-15 MED ORDER — FENTANYL CITRATE (PF) 100 MCG/2ML IJ SOLN: 50.0000 ug | Freq: Once | INTRAMUSCULAR | Status: AC

## 2022-09-15 MED ORDER — VANCOMYCIN HCL IN DEXTROSE 1-5 GM/200ML-% IV SOLN
1000.0000 mg | INTRAVENOUS | Status: AC
  Administered 2022-09-15: 1000 mg via INTRAVENOUS
  Filled 2022-09-15: qty 200

## 2022-09-15 MED ORDER — LEVOFLOXACIN IN D5W 500 MG/100ML IV SOLN
500.0000 mg | INTRAVENOUS | Status: AC
  Administered 2022-09-15: 500 mg via INTRAVENOUS
  Filled 2022-09-15: qty 100

## 2022-09-15 MED ORDER — MIDAZOLAM HCL 2 MG/2ML IJ SOLN: 1.0000 mg | Freq: Once | INTRAMUSCULAR | Status: AC

## 2022-09-15 MED ORDER — LIDOCAINE HCL (CARDIAC) PF 100 MG/5ML IV SOSY
PREFILLED_SYRINGE | INTRAVENOUS | Status: DC | PRN
  Administered 2022-09-15: 20 mg via INTRAVENOUS

## 2022-09-15 MED ORDER — ORAL CARE MOUTH RINSE: 15.0000 mL | Freq: Once | OROMUCOSAL | Status: AC

## 2022-09-15 MED ORDER — ROPIVACAINE HCL 5 MG/ML IJ SOLN
INTRAMUSCULAR | Status: DC | PRN
  Administered 2022-09-15: 30 mL via PERINEURAL

## 2022-09-15 MED ORDER — MIDAZOLAM HCL 2 MG/2ML IJ SOLN
INTRAMUSCULAR | Status: AC
  Administered 2022-09-15: 1 mg via INTRAVENOUS
  Filled 2022-09-15: qty 2

## 2022-09-15 MED ORDER — ONDANSETRON HCL 4 MG/2ML IJ SOLN
INTRAMUSCULAR | Status: DC | PRN
  Administered 2022-09-15: 4 mg via INTRAVENOUS

## 2022-09-15 MED ORDER — DEXAMETHASONE SODIUM PHOSPHATE 10 MG/ML IJ SOLN
INTRAMUSCULAR | Status: DC | PRN
  Administered 2022-09-15: 10 mg

## 2022-09-15 MED ORDER — LACTATED RINGERS IV SOLN: INTRAVENOUS | Status: DC

## 2022-09-15 MED ORDER — FENTANYL CITRATE (PF) 100 MCG/2ML IJ SOLN
INTRAMUSCULAR | Status: AC
  Administered 2022-09-15: 50 ug via INTRAVENOUS
  Filled 2022-09-15: qty 2

## 2022-09-15 MED ORDER — FENTANYL CITRATE (PF) 100 MCG/2ML IJ SOLN
INTRAMUSCULAR | Status: AC
  Filled 2022-09-15: qty 2

## 2022-09-15 MED ORDER — CHLORHEXIDINE GLUCONATE 0.12 % MT SOLN
15.0000 mL | Freq: Once | OROMUCOSAL | Status: AC
  Administered 2022-09-15: 15 mL via OROMUCOSAL
  Filled 2022-09-15: qty 15

## 2022-09-15 MED ORDER — FENTANYL CITRATE (PF) 100 MCG/2ML IJ SOLN
INTRAMUSCULAR | Status: DC | PRN
  Administered 2022-09-15 (×2): 50 ug via INTRAVENOUS

## 2022-09-15 MED ORDER — ACETAMINOPHEN 10 MG/ML IV SOLN: 1000.0000 mg | Freq: Once | INTRAVENOUS | Status: DC | PRN

## 2022-09-15 SURGICAL SUPPLY — 37 items
BAG COUNTER SPONGE SURGICOUNT (BAG) ×1 IMPLANT
BAG SPNG CNTER NS LX DISP (BAG) ×1
BLADE SAW RECIP 87.9 MT (BLADE) IMPLANT
BLADE SURG 21 STRL SS (BLADE) ×1 IMPLANT
CANISTER PREVENA PLUS 150 (CANNISTER) IMPLANT
CANISTER WOUND CARE 500ML ATS (WOUND CARE) ×1 IMPLANT
COVER SURGICAL LIGHT HANDLE (MISCELLANEOUS) ×1 IMPLANT
DRAPE EXTREMITY T 121X128X90 (DISPOSABLE) ×1 IMPLANT
DRAPE HALF SHEET 40X57 (DRAPES) ×1 IMPLANT
DRAPE INCISE IOBAN 66X45 STRL (DRAPES) ×1 IMPLANT
DRAPE U-SHAPE 47X51 STRL (DRAPES) ×2 IMPLANT
DRESSING PEEL AND PLC PRVNA 13 (GAUZE/BANDAGES/DRESSINGS) IMPLANT
DRESSING PREVENA PLUS CUSTOM (GAUZE/BANDAGES/DRESSINGS) ×1 IMPLANT
DRSG DERMACEA 8X12 NADH (GAUZE/BANDAGES/DRESSINGS) IMPLANT
DRSG PEEL AND PLACE PREVENA 13 (GAUZE/BANDAGES/DRESSINGS) ×1
DRSG PREVENA PLUS CUSTOM (GAUZE/BANDAGES/DRESSINGS) ×1
DURAPREP 26ML APPLICATOR (WOUND CARE) ×1 IMPLANT
ELECT REM PT RETURN 9FT ADLT (ELECTROSURGICAL) ×1
ELECTRODE REM PT RTRN 9FT ADLT (ELECTROSURGICAL) ×1 IMPLANT
GLOVE BIOGEL PI IND STRL 9 (GLOVE) ×1 IMPLANT
GLOVE SURG ORTHO 9.0 STRL STRW (GLOVE) ×1 IMPLANT
GOWN STRL REUS W/ TWL XL LVL3 (GOWN DISPOSABLE) ×2 IMPLANT
GOWN STRL REUS W/TWL XL LVL3 (GOWN DISPOSABLE) ×2
GRAFT SKIN WND MICRO 38 (Tissue) IMPLANT
KIT BASIN OR (CUSTOM PROCEDURE TRAY) ×1 IMPLANT
KIT DRSG PREVENA PLUS 7DAY 125 (MISCELLANEOUS) IMPLANT
KIT TURNOVER KIT B (KITS) ×1 IMPLANT
MANIFOLD NEPTUNE II (INSTRUMENTS) ×1 IMPLANT
NS IRRIG 1000ML POUR BTL (IV SOLUTION) ×1 IMPLANT
PACK GENERAL/GYN (CUSTOM PROCEDURE TRAY) ×1 IMPLANT
PAD ARMBOARD 7.5X6 YLW CONV (MISCELLANEOUS) ×1 IMPLANT
PREVENA RESTOR ARTHOFORM 46X30 (CANNISTER) ×1 IMPLANT
STAPLER VISISTAT 35W (STAPLE) IMPLANT
SUT ETHILON 2 0 PSLX (SUTURE) ×2 IMPLANT
SUT SILK 2 0 (SUTURE)
SUT SILK 2-0 18XBRD TIE 12 (SUTURE) IMPLANT
TOWEL GREEN STERILE (TOWEL DISPOSABLE) ×1 IMPLANT

## 2022-09-15 NOTE — Telephone Encounter (Signed)
Orthopedic Note  Patient called the on call number this evening. His prevena had stopped working. There was blood coming outside of the vac and leaking onto his floor. His wife called the ER but they said that they could not help him with this issue so he called the on call number. I attempted to instruct him on how to find the leak and then seal it. He could not get it to stop. I suggested he go to the ER and they could page me if they could not figure out the issue. He wanted to meet me so that I could just fix it. We met at the office and I attempted to seal the leak. There was still a blockage within the track pad tubing, so I replaced the track pad. Once this was done, seal was restored and the suction was working. I waited for in the office with him to make sure there were no further issues. There was no further issues. I rewrapped his foot/ankle with gauze and an ace wrap. I told him to call the on call number if there were any further issues.   London Sheer, MD Orthopedic Surgeon

## 2022-09-15 NOTE — H&P (Signed)
Thomas [name] is an 72 y.o. male.   Chief Complaint: Dehiscence left transmetatarsal amputation. HPI: Patient was at home postoperative day 1 status post transmetatarsal amputation.  He is on his kneeling scooter.  The scooter struck the rug he fell over and landed directly on the residual limb causing a dehiscence and hematoma of the amputation.  Past Medical History:  Diagnosis Date   Anemia    Atrial flutter (HCC) 01/2020   Cancer (HCC)    prostate   COVID 2021   mild case   Dysrhythmia    Aflutter   Heart failure with reduced ejection fraction (HCC)    Pt denies, it is in cardiologist notes.   Heart murmur    Hypertension    Hypoglycemia    occ   NICM (nonischemic cardiomyopathy) (HCC) 02/12/2020   Prostate cancer (HCC) 2019   Rheumatoid arthritis [facility])     Past Surgical History:  Procedure Laterality Date   AMPUTATION Right 02/10/2022   Procedure: RIGHT TRANSMETATARSAL AMPUTATION;  Surgeon: Nadara Mustard, MD;  Location: [facility] OR;  Service: Orthopedics;  Laterality: Right;   AMPUTATION Right 03/10/2022   Procedure: RIGHT FOOT CHOPART AMPUTATION;  Surgeon: Nadara Mustard, MD;  Location: [facility] OR;  Service: Orthopedics;  Laterality: Right;   AMPUTATION Left 08/16/2022   Procedure: LEFT 3RD TOE AMPUTATION;  Surgeon: Nadara Mustard, MD;  Location: [facility] OR;  Service: Orthopedics;  Laterality: Left;   AMPUTATION Left 09/13/2022   Procedure: LEFT TRANSMETATARSAL AMPUTATION;  Surgeon: Nadara Mustard, MD;  Location: [facility] OR;  Service: Orthopedics;  Laterality: Left;   AMPUTATION TOE Right 03/26/2020   Procedure: Right 3rd toe partial amputation, bone biopsy right 1st metatarsal;  Surgeon: Park Liter, DPM;  Location: [facility] ORS;  Service: Podiatry;  Laterality: Right;   BUBBLE STUDY  02/11/2020   Procedure: BUBBLE STUDY;  Surgeon: Sande Rives, MD;  Location: [facility] ENDOSCOPY;  Service: Cardiovascular;;   CARDIOVERSION N/A 02/11/2020   Procedure: CARDIOVERSION;  Surgeon: Sande Rives, MD;  Location: [facility] ENDOSCOPY;  Service: Cardiovascular;  Laterality: N/A;   CARDIOVERSION N/A 08/31/2021   Procedure: CARDIOVERSION;  Surgeon: Jake Bathe, MD;  Location: [facility] ENDOSCOPY;  Service: Cardiovascular;  Laterality: N/A;   CYSTOSCOPY  04/11/2018   Procedure: Derinda Late;  Surgeon: Crist Fat, MD;  Location: [facility];  Service: Urology;;  NO SEEDS FOUND IN BLADDER   HERNIA REPAIR  2009   inguinal   INTRAMEDULLARY (IM) NAIL INTERTROCHANTERIC Right 05/05/2020   Procedure: CPT 27245-Cephalomedullary nailing of right intertrochanteric femur fracture;  Surgeon: Roby Lofts, MD;  Location: [facility] OR;  Service: Orthopedics;  Laterality: Right;   INTRAMEDULLARY (IM) NAIL INTERTROCHANTERIC Right 12/24/2020   Procedure: REVISION FIXATION OF RIGHT INTERTROCHANTERIC FEMUR NONUNION;  Surgeon: Roby Lofts, MD;  Location: [facility] OR;  Service: Orthopedics;  Laterality: Right;   IRRIGATION AND DEBRIDEMENT FOOT Left 03/26/2020   Procedure: Left foot incision and drainage with removal of all non-viable soft tissue and bone - areas overlying the 2nd and 5th metatarsals.;  Surgeon: Park Liter, DPM;  Location: [facility] ORS;  Service: Podiatry;  Laterality: Left;   RADIOACTIVE SEED IMPLANT N/A 04/11/2018   Procedure: RADIOACTIVE SEED IMPLANT/BRACHYTHERAPY IMPLANT;  Surgeon: Crist Fat, MD;  Location: [facility];  Service: Urology;  Laterality: N/A;   71     SEEDS IMPLANTED   RIGHT/LEFT HEART CATH AND CORONARY ANGIOGRAPHY N/A 02/09/2020   Procedure: RIGHT/LEFT HEART CATH AND  CORONARY ANGIOGRAPHY;  Surgeon: Yvonne Kendall, MD;  [facility]: [facility] INVASIVE CV LAB;  Service: Cardiovascular;  Laterality: N/A;   SPACE OAR INSTILLATION N/A 04/11/2018   Procedure: SPACE OAR INSTILLATION;  Surgeon: Crist Fat, MD;  Location: [facility];  Service: Urology;  Laterality: N/A;   TEE WITHOUT CARDIOVERSION N/A 02/11/2020    Procedure: TRANSESOPHAGEAL ECHOCARDIOGRAM (TEE);  Surgeon: Sande Rives, MD;  Location: [facility] ENDOSCOPY;  Service: Cardiovascular;  Laterality: N/A;   TOTAL KNEE ARTHROPLASTY Bilateral 09/13/2015   Procedure: BILATERAL KNEE ARTHROPLASTY ;  Surgeon: Durene Romans, MD;  Location: [facility]S;  Service: Orthopedics;  Laterality: Bilateral;    Family History  Problem Relation Age of Onset   Prostate cancer Father    Prostate cancer Brother    Colon cancer Neg Hx    Rectal cancer Neg Hx    Stomach cancer Neg Hx    Social History:  reports that he has never smoked. He has never used smokeless tobacco. He reports that he does not currently use alcohol after a past usage of about 7.0 standard drinks of alcohol per week. He reports that he does not currently use drugs.  Allergies:  Allergies  Allergen Reactions   Cefadroxil Hives and Palpitations    EMS to ED from [facility].   Diltiazem Hcl Swelling   Chlorthalidone Other (See Comments)    "Makes me light-headed and I don't like the way it makes me feel"   Voltaren [Diclofenac] Rash    No medications prior to admission.    No results found for this or any previous visit (from the past 48 hour(s)). No results found.  Review of Systems  All other systems reviewed and are negative.   There were no vitals taken for this visit. Physical Exam  Patient is alert oriented no adenopathy well-dressed normal affect normal respiratory effort.  He has a palpable dorsalis pedis pulse.  There is complete dehiscence of the lateral aspect of the surgical wound with a large hematoma.  There is no surrounding cellulitis no purulent drainage no signs of infection.  The hematoma and wound extends to bone. Assessment/Plan Assessment: Dehiscence left transmetatarsal amputation.  Plan: Will plan for debridement of the hematoma bony debridement soft tissue debridement and revision of the amputation.  Risk and benefits were discussed including infection nonhealing  the wound need for additional surgery.  Patient states he understands wished to proceed at this time.  Nadara Mustard, MD 09/15/2022, 6:42 AM

## 2022-09-15 NOTE — Op Note (Signed)
09/15/2022  12:37 PM  PATIENT:  Thomas [name]    PRE-OPERATIVE DIAGNOSIS:  Dehiscence Left Transmetatarsal Amputation  POST-OPERATIVE DIAGNOSIS:  Same  PROCEDURE: Excisional debridement left foot with excision of bone and soft tissue muscle fascia and skin with debridement large hematoma.  Tissue sent for cultures.  Application Kerecis micro graft 38 cm.  Application of 13 cm Prevena wound VAC.  SURGEON:  Nadara Mustard, MD  PHYSICIAN ASSISTANT:None ANESTHESIA:   General  PREOPERATIVE INDICATIONS:  [name] is a  72 y.o. male with a diagnosis of Dehiscence Left Transmetatarsal Amputation who failed conservative measures and elected for surgical management.    The risks benefits and alternatives were discussed with the patient preoperatively including but not limited to the risks of infection, bleeding, nerve injury, cardiopulmonary complications, the need for revision surgery, among others, and the patient was willing to proceed.  OPERATIVE IMPLANTS:   Implant Name Type Inv. Item Serial No. Manufacturer Lot No. LRB No. Used Action  GRAFT SKIN WND MICRO 38 - [id number] Tissue GRAFT SKIN WND MICRO 38  [employer] [id number] Left 1 Implanted    @ENCIMAGES @  OPERATIVE FINDINGS: Patient had a large hematoma with exposed bone.  Bone was resected as well as soft tissue and hematoma this was sent for cultures.  OPERATIVE PROCEDURE: Patient was brought the operating room after undergoing a regional anesthetic.  After adequate levels anesthesia were obtained patient's left lower extremity was prepped using DuraPrep draped into a sterile field a timeout was called.  Elliptical incision was made around the surgical incision.  There was a large hematoma that extended down to bone.  The distal centimeter of bone was resected back to healthy viable bleeding bone.  The resected bone was sent for cultures as well.  The wound was irrigated with normal saline further soft tissue  resection was performed with a 21 blade knife and a rondure with excision of skin and soft tissue muscle and fascia.  The wound was irrigated with normal saline electrocautery was used hemostasis there was good petechial bleeding.  The wound was filled with 38 cm of Kerecis micro graft.  The wound was closed using 2-0 nylon.  A Prevena 13 cm wound VAC was applied this had a good suction fit patient was taken the PACU in stable condition.   DISCHARGE PLANNING:  Antibiotic duration: Preoperative antibiotics with vancomycin and Levaquin  Weightbearing: Touchdown weightbearing on the left  Pain medication: Patient has a standing order for pain medication  Dressing care/ Wound VAC: Wound VAC  Ambulatory devices: Walker crutches or kneeling scooter  Discharge to: Home.  Follow-up: In the office 1 week post operative.

## 2022-09-15 NOTE — Anesthesia Postprocedure Evaluation (Signed)
Anesthesia Post Note  Patient: Thomas [name]  Procedure(s) Performed: REVISION LEFT TRANSMETATARSAL AMPUTATION (Left)     Patient location during evaluation: PACU Anesthesia Type: Regional and MAC Level of consciousness: awake and alert Pain management: pain level controlled Vital Signs Assessment: post-procedure vital signs reviewed and stable Respiratory status: spontaneous breathing, nonlabored ventilation, respiratory function stable and patient connected to nasal cannula oxygen Cardiovascular status: stable and blood pressure returned to baseline Postop Assessment: no apparent nausea or vomiting Anesthetic complications: no   No notable events documented.  Last Vitals:  Vitals:   09/15/22 1300 09/15/22 1315  BP: 99/71 120/76  Pulse:    Resp: 12 15  Temp:  36.6 C  SpO2: 95% 96%    Last Pain:  Vitals:   09/15/22 1315  TempSrc:   PainSc: 0-No pain                 Earl Lites P Ruhan Borak

## 2022-09-15 NOTE — Transfer of Care (Signed)
Immediate Anesthesia Transfer of Care Note  Patient: Thomas [name]  Procedure(s) Performed: REVISION LEFT TRANSMETATARSAL AMPUTATION (Left)  Patient Location: PACU  Anesthesia Type:MAC combined with regional for post-op pain  Level of Consciousness: drowsy and patient cooperative  Airway & Oxygen Therapy: Patient Spontanous Breathing  Post-op Assessment: Report given to RN and Post -op Vital signs reviewed and stable  Post vital signs: Reviewed and stable  Last Vitals:  Vitals Value Taken Time  BP    Temp    Pulse    Resp    SpO2      Last Pain:  Vitals:   09/15/22 1145  TempSrc:   PainSc: 6          Complications: No notable events documented.

## 2022-09-15 NOTE — Interval H&P Note (Signed)
History and Physical Interval Note:  09/15/2022 11:43 AM  Thomas [name]  has presented today for surgery, with the diagnosis of Dehiscence Left Transmetatarsal Amputation.  The various methods of treatment have been discussed with the patient and family. After consideration of risks, benefits and other options for treatment, the patient has consented to  Procedure(s): REVISION LEFT TRANSMETATARSAL AMPUTATION (Left) as a surgical intervention.  The patient's history has been reviewed, patient examined, no change in status, stable for surgery.  I have reviewed the patient's chart and labs.  Questions were answered to the patient's satisfaction.     Nadara Mustard

## 2022-09-15 NOTE — Progress Notes (Signed)
Office Visit Note   Patient: Thomas [name]           Date of Birth: Jan 25, 1951           MRN: [id number] Visit Date: 09/05/2022              Requested by: Carney Living, MD [address] [place],  [place] [address] PCP: Carney Living, MD  Chief Complaint  Patient presents with   Left Foot - Routine Post Op    08/16/2022 left 3rd toe amputation       HPI: Patient is a 72 year old gentleman who is status post left third toe amputation 3 weeks ago.  Patient has been full weightbearing with a cane and Converse sneakers.  Patient states he has clear pink drainage and pain.  Patient has developed new ulcers over the lateral border of the fourth and fifth toes.  There is dehiscence of the incision.  Assessment & Plan: Visit Diagnoses:  1. Dehiscence of amputation stump (HCC)   2. PVD (peripheral vascular disease) (HCC)   3. Dehiscence of amputation stump of right lower extremity (HCC)     Plan: Will plan for a transmetatarsal amputation due to wound dehiscence and new ulcers on the third and fourth toes.  Follow-Up Instructions: No follow-ups on file.   Ortho Exam  Patient is alert, oriented, no adenopathy, well-dressed, normal affect, normal respiratory effort. Patient's hemoglobin A1c was 6.23 years ago.  Patient has ischemic ulcers over the lateral fifth metatarsal head and lateral fifth toe.  There is also ischemic ulcer over the fourth toe and first metatarsal head.  Patient has a palpable dorsalis pedis pulse and a strong biphasic dorsalis pedis pulse by Doppler.  There is dehiscence of the third ray incision.  Imaging: No results found.   Labs: Lab Results  Component Value Date   HGBA1C 6.0 (H) 09/05/2022   HGBA1C 6.2 (H) 02/03/2020   HGBA1C 5.3 10/28/2015   ESRSEDRATE 14 06/21/2021   ESRSEDRATE 17 05/18/2021   ESRSEDRATE 20 (H) 03/28/2020   CRP 19.2 (H) 06/21/2021   CRP 11.1 (H) 05/18/2021   CRP 2.7 (H) 03/28/2020   REPTSTATUS  PENDING 09/15/2022   GRAMSTAIN  09/15/2022    RARE WBC PRESENT,BOTH PMN AND MONONUCLEAR NO ORGANISMS SEEN Performed at [facility] Lab, [address] [place], [place] [address]    CULT PENDING 09/15/2022   [facility] STAPHYLOCOCCUS AUREUS 03/26/2020     Lab Results  Component Value Date   ALBUMIN 4.0 06/30/2022   ALBUMIN 3.0 (L) 03/26/2020   ALBUMIN 3.6 03/25/2020    Lab Results  Component Value Date   MG 1.8 05/22/2021   MG 2.0 02/12/2020   MG 1.7 02/11/2020   Lab Results  Component Value Date   VD25OH 28.61 (L) 05/05/2020    No results found for: "PREALBUMIN"    Latest Ref Rng & Units 08/16/2022    6:24 AM 03/10/2022   10:10 AM 02/03/2022    2:24 PM  CBC EXTENDED  WBC 4.0 - 10.5 K/uL 4.0  5.1  10.6   RBC 4.22 - 5.81 MIL/uL 4.29  4.80  4.84   Hemoglobin 13.0 - 17.0 g/dL 82.9  56.2  13.0   HCT 39.0 - 52.0 % 34.9  37.4  39.6   Platelets 150 - 400 K/uL 249  370  311      There is no height or weight on file to calculate BMI.  Orders:  Orders Placed This  Encounter  Procedures   HgB A1c   No orders of the defined types were placed in this encounter.    Procedures: No procedures performed  Clinical Data: No additional findings.  ROS:  All other systems negative, except as noted in the HPI. Review of Systems  Objective: Vital Signs: There were no vitals taken for this visit.  Specialty Comments:  No specialty comments available.  PMFS History: Patient Active Problem List   Diagnosis Date Noted   Osteomyelitis of third toe of left foot (HCC) 08/16/2022   Depressed mood 06/20/2022   Dehiscence of amputation stump of left lower extremity (HCC) 03/10/2022   History of transmetatarsal amputation of right foot (HCC) 02/15/2022   Chronic osteomyelitis of toe, right (HCC) 02/10/2022   Fracture of intertrochanteric section of femur, closed, right, with nonunion, subsequent encounter 12/24/2020   Inguinal hernia 10/18/2020   Chronic combined systolic  and diastolic CHF, NYHA class 2 (HCC)    Osteoporosis 05/05/2020   Foot infection    NICM (nonischemic cardiomyopathy) (HCC), no significant CAD on cardiac cath   02/12/2020   Right foot pain    Rheumatoid arthritis (HCC)    Atrial flutter (HCC) 02/02/2020   Lower extremity edema 12/30/2019   Weight loss, non-intentional 11/24/2019   Chronic right hip pain 06/19/2019   Degenerative tear of acetabular labrum of right hip 05/02/2019   Primary osteoarthritis of right hip 05/02/2019   Skin ulcer (HCC) 01/29/2019   Malignant neoplasm of prostate (HCC) 01/16/2018   Insomnia 10/02/2017   S/P bilateral TKA 09/13/2015   Other bilateral secondary osteoarthritis of knee 07/14/2014   Heme positive stool 05/21/2013   Osteoarthritis, multiple sites 01/13/2011   Hypertension 12/06/2010   CLAUSTROPHOBIA 03/01/2010   Past Medical History:  Diagnosis Date   Anemia    Atrial flutter (HCC) 01/2020   Cancer (HCC)    prostate   COVID 2021   mild case   Dysrhythmia    Aflutter   Heart failure with reduced ejection fraction (HCC)    Pt denies, it is in cardiologist notes.   Heart murmur    Hypertension    Hypoglycemia    occ   NICM (nonischemic cardiomyopathy) (HCC) 02/12/2020   Prostate cancer (HCC) 2019   Rheumatoid arthritis (HCC)     Family History  Problem Relation Age of Onset   Prostate cancer Father    Prostate cancer Brother    Colon cancer Neg Hx    Rectal cancer Neg Hx    Stomach cancer Neg Hx     Past Surgical History:  Procedure Laterality Date   AMPUTATION Right 02/10/2022   Procedure: RIGHT TRANSMETATARSAL AMPUTATION;  Surgeon: Nadara Mustard, MD;  Location: [facility] OR;  Service: Orthopedics;  Laterality: Right;   AMPUTATION Right 03/10/2022   Procedure: RIGHT FOOT CHOPART AMPUTATION;  Surgeon: Nadara Mustard, MD;  Location: [facility] OR;  Service: Orthopedics;  Laterality: Right;   AMPUTATION Left 08/16/2022   Procedure: LEFT 3RD TOE AMPUTATION;  Surgeon: Nadara Mustard, MD;   Location: [facility] OR;  Service: Orthopedics;  Laterality: Left;   AMPUTATION Left 09/13/2022   Procedure: LEFT TRANSMETATARSAL AMPUTATION;  Surgeon: Nadara Mustard, MD;  Location: [facility] OR;  Service: Orthopedics;  Laterality: Left;   AMPUTATION TOE Right 03/26/2020   Procedure: Right 3rd toe partial amputation, bone biopsy right 1st metatarsal;  Surgeon: Park Liter, DPM;  Location: [facility]S;  Service: Podiatry;  Laterality: Right;   BUBBLE STUDY  02/11/2020   Procedure:  BUBBLE STUDY;  Surgeon: Sande Rives, MD;  Location: [facility] ENDOSCOPY;  Service: Cardiovascular;;   CARDIOVERSION N/A 02/11/2020   Procedure: CARDIOVERSION;  Surgeon: Sande Rives, MD;  Location: [facility] ENDOSCOPY;  Service: Cardiovascular;  Laterality: N/A;   CARDIOVERSION N/A 08/31/2021   Procedure: CARDIOVERSION;  Surgeon: Jake Bathe, MD;  Location: [facility] ENDOSCOPY;  Service: Cardiovascular;  Laterality: N/A;   CYSTOSCOPY  04/11/2018   Procedure: Derinda Late;  Surgeon: Crist Fat, MD;  Location: [facility];  Service: Urology;;  NO SEEDS FOUND IN BLADDER   HERNIA REPAIR  2009   inguinal   INTRAMEDULLARY (IM) NAIL INTERTROCHANTERIC Right 05/05/2020   Procedure: CPT 27245-Cephalomedullary nailing of right intertrochanteric femur fracture;  Surgeon: Roby Lofts, MD;  Location: [facility] OR;  Service: Orthopedics;  Laterality: Right;   INTRAMEDULLARY (IM) NAIL INTERTROCHANTERIC Right 12/24/2020   Procedure: REVISION FIXATION OF RIGHT INTERTROCHANTERIC FEMUR NONUNION;  Surgeon: Roby Lofts, MD;  Location: [facility] OR;  Service: Orthopedics;  Laterality: Right;   IRRIGATION AND DEBRIDEMENT FOOT Left 03/26/2020   Procedure: Left foot incision and drainage with removal of all non-viable soft tissue and bone - areas overlying the 2nd and 5th metatarsals.;  Surgeon: Park Liter, DPM;  Location: [facility]S;  Service: Podiatry;  Laterality: Left;   RADIOACTIVE SEED IMPLANT N/A 04/11/2018   Procedure:  RADIOACTIVE SEED IMPLANT/BRACHYTHERAPY IMPLANT;  Surgeon: Crist Fat, MD;  Location: [facility];  Service: Urology;  Laterality: N/A;   71     SEEDS IMPLANTED   RIGHT/LEFT HEART CATH AND CORONARY ANGIOGRAPHY N/A 02/09/2020   Procedure: RIGHT/LEFT HEART CATH AND CORONARY ANGIOGRAPHY;  Surgeon: Yvonne Kendall, MD;  Location: [facility] INVASIVE CV LAB;  Service: Cardiovascular;  Laterality: N/A;   SPACE OAR INSTILLATION N/A 04/11/2018   Procedure: SPACE OAR INSTILLATION;  Surgeon: Crist Fat, MD;  Location: [facility];  Service: Urology;  Laterality: N/A;   TEE WITHOUT CARDIOVERSION N/A 02/11/2020   Procedure: TRANSESOPHAGEAL ECHOCARDIOGRAM (TEE);  Surgeon: Sande Rives, MD;  Location: [facility] ENDOSCOPY;  Service: Cardiovascular;  Laterality: N/A;   TOTAL KNEE ARTHROPLASTY Bilateral 09/13/2015   Procedure: BILATERAL KNEE ARTHROPLASTY ;  Surgeon: Durene Romans, MD;  Location: [facility]S;  Service: Orthopedics;  Laterality: Bilateral;   Social History   Occupational History   Not on file  Tobacco Use   Smoking status: Never   Smokeless tobacco: Never  Vaping Use   Vaping Use: Never used  Substance and Sexual Activity   Alcohol use: Not Currently    Alcohol/week: 7.0 standard drinks of alcohol    Types: 7 Cans of beer per week   Drug use: Not Currently   Sexual activity: Yes

## 2022-09-16 ENCOUNTER — Encounter (HOSPITAL_COMMUNITY): Payer: Self-pay | Admitting: Orthopedic Surgery

## 2022-09-16 LAB — AEROBIC/ANAEROBIC CULTURE W GRAM STAIN (SURGICAL/DEEP WOUND)

## 2022-09-17 LAB — AEROBIC/ANAEROBIC CULTURE W GRAM STAIN (SURGICAL/DEEP WOUND)

## 2022-09-19 ENCOUNTER — Encounter (HOSPITAL_COMMUNITY): Payer: Self-pay | Admitting: Orthopedic Surgery

## 2022-09-19 DIAGNOSIS — T8131XA Disruption of external operation (surgical) wound, not elsewhere classified, initial encounter: Secondary | ICD-10-CM | POA: Diagnosis present

## 2022-09-19 NOTE — Anesthesia Procedure Notes (Signed)
Anesthesia Regional Block: Popliteal block   Pre-Anesthetic Checklist: , timeout performed,  Correct Patient, Correct Site, Correct Laterality,  Correct Procedure, Correct Position, site marked,  Risks and benefits discussed,  Surgical consent,  Pre-op evaluation,  At surgeon's request and post-op pain management  Laterality: Left  Prep: Dura Prep       Needles:  Injection technique: Single-shot  Needle Type: Echogenic Stimulator Needle      Needle Gauge: 20     Additional Needles:   Procedures:,,,, ultrasound used (permanent image in chart),,    Narrative:  Start time: 09/15/2022 11:33 AM End time: 09/15/2022 11:37 AM Injection made incrementally with aspirations every 5 mL.  Performed by: Personally  Anesthesiologist: Atilano Median, DO  Additional Notes: Patient identified. Risks/Benefits/Options discussed with patient including but not limited to bleeding, infection, nerve damage, failed block, incomplete pain control. Patient expressed understanding and wished to proceed. All questions were answered. Sterile technique was used throughout the entire procedure. Please see nursing notes for vital signs. Aspirated in 5cc intervals with injection for negative confirmation. Patient was given instructions on fall risk and not to get out of bed. All questions and concerns addressed with instructions to call with any issues or inadequate analgesia.

## 2022-09-19 NOTE — Addendum Note (Signed)
Addendum  created 09/19/22 1426 by Atilano Median, DO   Child order released for a procedure order, Clinical Note Signed, Intraprocedure Blocks edited, SmartForm saved

## 2022-09-19 NOTE — Discharge Summary (Signed)
Discharge Diagnoses:  Principal Problem:   Wound dehiscence, surgical   Surgeries: Procedure(s): REVISION LEFT TRANSMETATARSAL AMPUTATION on 09/15/2022    Consultants:   Discharged Condition: Improved  Hospital Course: Thomas [name] is an 72 y.o. male who was admitted 09/15/2022 with a chief complaint of wound dehiscence, with a final diagnosis of Dehiscence Left Transmetatarsal Amputation.  Patient was brought to the operating room on 09/15/2022 and underwent Procedure(s): REVISION LEFT TRANSMETATARSAL AMPUTATION.    Patient was given perioperative antibiotics:  Anti-infectives (From admission, onward)    Start     Dose/Rate Route Frequency Ordered Stop   09/15/22 1000  levofloxacin (LEVAQUIN) IVPB 500 mg        500 mg 100 mL/hr over 60 Minutes Intravenous On call to O.R. 09/15/22 0958 09/15/22 1200   09/15/22 1000  vancomycin (VANCOCIN) IVPB 1000 mg/200 mL premix        1,000 mg 200 mL/hr over 60 Minutes Intravenous On call to O.R. 09/15/22 1610 09/15/22 1145     .  Patient was given sequential compression devices, early ambulation, and aspirin for DVT prophylaxis.  Recent vital signs: No data found..  Recent laboratory studies: No results found.  Discharge Medications:   Allergies as of 09/15/2022       Reactions   Cefadroxil Hives, Palpitations   EMS to ED from UC.   Diltiazem Hcl Swelling   Chlorthalidone Other (See Comments)   "Makes me light-headed and I don't like the way it makes me feel"   Voltaren [diclofenac] Rash        Medication List     TAKE these medications    amiodarone 100 MG tablet Commonly known as: PACERONE Take 1 tablet (100 mg total) by mouth daily.   apixaban 5 MG Tabs tablet Commonly known as: ELIQUIS Take 1 tablet (5 mg total) by mouth 2 (two) times daily.   diazepam 5 MG tablet Commonly known as: VALIUM 5 mg at bedtime as needed for muscle spasms (To relax prostate).   DULoxetine 60 MG capsule Commonly known as:  CYMBALTA TAKE 1 CAPSULE BY MOUTH EVERY DAY -   lisinopril 10 MG tablet Commonly known as: ZESTRIL Take 1 tablet (10 mg total) by mouth daily. What changed:  how much to take when to take this   metoprolol succinate 25 MG 24 hr tablet Commonly known as: Toprol XL Take 1 tablet (25 mg total) by mouth daily. What changed: how much to take   Narcan 4 MG/0.1ML Liqd nasal spray kit Generic drug: naloxone Place 1 spray into the nose as needed (as directed for emergency).   oxycodone 30 MG immediate release tablet Commonly known as: ROXICODONE Take 1 tablet (30 mg total) by mouth every 4 (four) hours as needed for pain.   oxycodone 30 MG immediate release tablet Commonly known as: ROXICODONE Take 1 tablet (30 mg total) by mouth every 4 (four) hours as needed for pain.   silver sulfADIAZINE 1 % cream Commonly known as: SILVADENE APPLY 1 APPLICATION TOPICALLY DAILY   sulfamethoxazole-trimethoprim 800-160 MG tablet Commonly known as: BACTRIM DS TAKE 1 TABLET BY MOUTH TWICE A DAY   tamsulosin 0.4 MG Caps capsule Commonly known as: FLOMAX Take 0.4 mg by mouth daily as needed (prostate spasms).   testosterone cypionate 200 MG/ML injection Commonly known as: DEPOTESTOSTERONE CYPIONATE Inject 100 mg into the muscle once a week.   torsemide 20 MG tablet Commonly known as: DEMADEX Take 20 mg by mouth daily.  Discharge Care Instructions  (From admission, onward)           Start     Ordered   09/15/22 0000  Touch down weight bearing       Question Answer Comment  Laterality left   Extremity Lower      09/15/22 1226            Diagnostic Studies: CT HIP RIGHT WO CONTRAST  Result Date: 09/03/2022 CLINICAL DATA:  Follow-up right hip fracture. Incomplete healing on prior CT scan 08/04/2021 EXAM: CT OF THE RIGHT HIP WITHOUT CONTRAST TECHNIQUE: Multidetector CT imaging of the right hip was performed according to the standard protocol. Multiplanar CT  image reconstructions were also generated. RADIATION DOSE REDUCTION: This exam was performed according to the departmental dose-optimization program which includes automated exposure control, adjustment of the mA and/or kV according to patient size and/or use of iterative reconstruction technique. COMPARISON:  CT scan 08/04/2021 FINDINGS: Stable appearance and position of the intramedullary gamma nail with 2 proximal dynamic hip screws and a distal interlocking screw. No complicating features such as loosening or fracture or AVN. The intertrochanteric fracture is now completely healed. Stable moderate right hip joint degenerative changes. No right hemipelvic bone lesions or fractures. The pubic symphysis and right SI joint are intact. The hip and pelvic musculature are grossly normal by CT. Stable right inguinal lymph nodes. No significant right-sided intrapelvic abnormalities. Brachytherapy seeds again noted in the prostate gland. IMPRESSION: 1. Stable appearance and position of the intramedullary gamma nail with 2 proximal dynamic hip screws and a distal interlocking screw. No complicating features such as loosening or fracture or AVN. 2. The intertrochanteric fracture is now completely healed. 3. Stable moderate right hip joint degenerative changes. Electronically Signed   By: Rudie Meyer M.D.   On: 09/03/2022 10:17    Patient benefited maximally from their hospital stay and there were no complications.     Disposition: Discharge disposition: 01-Home or Self Care      Discharge Instructions     Call MD / Call 911   Complete by: As directed    If you experience chest pain or shortness of breath, CALL 911 and be transported to the hospital emergency room.  If you develope a fever above 101 F, pus (white drainage) or increased drainage or redness at the wound, or calf pain, call your surgeon's office.   Constipation Prevention   Complete by: As directed    Drink plenty of fluids.  Prune juice may  be helpful.  You may use a stool softener, such as Colace (over the counter) 100 mg twice a day.  Use MiraLax (over the counter) for constipation as needed.   Diet - low sodium heart healthy   Complete by: As directed    Elevate operative extremity   Complete by: As directed    Increase activity slowly as tolerated   Complete by: As directed    Negative Pressure Wound Therapy - Incisional   Complete by: As directed    Patient to discharge with the Praveena plus pump with 3 additional wound VAC canisters.   Post-op shoe   Complete by: As directed    Post-operative opioid taper instructions:   Complete by: As directed    POST-OPERATIVE OPIOID TAPER INSTRUCTIONS: It is important to wean off of your opioid medication as soon as possible. If you do not need pain medication after your surgery it is ok to stop day one. Opioids include: Codeine, Hydrocodone(Norco, Vicodin), Oxycodone(Percocet,  oxycontin) and hydromorphone amongst others.  Long term and even short term use of opiods can cause: Increased pain response Dependence Constipation Depression Respiratory depression And more.  Withdrawal symptoms can include Flu like symptoms Nausea, vomiting And more Techniques to manage these symptoms Hydrate well Eat regular healthy meals Stay active Use relaxation techniques(deep breathing, meditating, yoga) Do Not substitute Alcohol to help with tapering If you have been on opioids for less than two weeks and do not have pain than it is ok to stop all together.  Plan to wean off of opioids This plan should start within one week post op of your joint replacement. Maintain the same interval or time between taking each dose and first decrease the dose.  Cut the total daily intake of opioids by one tablet each day Next start to increase the time between doses. The last dose that should be eliminated is the evening dose.      Touch down weight bearing   Complete by: As directed     Laterality: left   Extremity: Lower       Follow-up Information     Nadara Mustard, MD Follow up in 1 week(s).   Specialty: Orthopedic Surgery Contact information: [address] [place] [address] [phone]                  Signed: Nadara Mustard 09/19/2022, 5:22 PM

## 2022-09-20 ENCOUNTER — Other Ambulatory Visit: Payer: Self-pay | Admitting: Orthopedic Surgery

## 2022-09-20 ENCOUNTER — Telehealth: Payer: Self-pay

## 2022-09-20 ENCOUNTER — Telehealth: Payer: Self-pay | Admitting: Orthopedic Surgery

## 2022-09-20 LAB — AEROBIC/ANAEROBIC CULTURE W GRAM STAIN (SURGICAL/DEEP WOUND)

## 2022-09-20 MED ORDER — SULFAMETHOXAZOLE-TRIMETHOPRIM 800-160 MG PO TABS: 1.0000 | ORAL_TABLET | Freq: Two times a day (BID) | ORAL | 1 refills | Status: DC

## 2022-09-20 NOTE — Telephone Encounter (Signed)
Pt called stating he need to speak to Autumn F directly. Pt would not leave a message. Pt phone number is [phone].

## 2022-09-20 NOTE — Telephone Encounter (Signed)
I called pt and advised about abx and he will pick this up tonight. Pt has follow up in the office tomorrow.

## 2022-09-20 NOTE — Telephone Encounter (Signed)
Will address at appt tomorrow.

## 2022-09-20 NOTE — Telephone Encounter (Signed)
-----   Message from Nadara Mustard, MD sent at 09/20/2022 10:52 AM EDT ----- Cultures are showing staph sensitive to the Bactrim DS that he has been on before.  Prescription sent in for another prescription for Bactrim DS ----- Message ----- From: Interface, Lab In [place] Sent: 09/20/2022   9:35 AM EDT To: Nadara Mustard, MD

## 2022-09-21 ENCOUNTER — Ambulatory Visit (INDEPENDENT_AMBULATORY_CARE_PROVIDER_SITE_OTHER): Payer: Medicare Other | Admitting: Orthopedic Surgery

## 2022-09-21 DIAGNOSIS — T8781 Dehiscence of amputation stump: Secondary | ICD-10-CM

## 2022-09-21 DIAGNOSIS — Z89431 Acquired absence of right foot: Secondary | ICD-10-CM

## 2022-09-24 ENCOUNTER — Encounter: Payer: Self-pay | Admitting: Orthopedic Surgery

## 2022-09-24 NOTE — Progress Notes (Signed)
Office Visit Note   Patient: Thomas [name]           Date of Birth: 1951/03/25           MRN: [id number] Visit Date: 09/21/2022              Requested by: Carney Living, MD [address] [place],  [place] [address] PCP: Carney Living, MD  Chief Complaint  Patient presents with   Left Foot - Routine Post Op    09/15/22 revision left transmet amputation      HPI: Patient is a 72 year old gentleman who presents 1 week status post revision left transmetatarsal amputation.  Patient is on Bactrim DS cultures are sensitive.  Assessment & Plan: Visit Diagnoses:  1. Dehiscence of amputation stump of left lower extremity (HCC)   2. History of Chopart amputation of right foot (HCC)     Plan: Start Dial soap cleansing dry dressing changes minimize weightbearing.  Follow-Up Instructions: No follow-ups on file.   Ortho Exam  Patient is alert, oriented, no adenopathy, well-dressed, normal affect, normal respiratory effort. Examination there is swelling and maceration there is clear serosanguineous drainage.  Imaging: No results found. No images are attached to the encounter.  Labs: Lab Results  Component Value Date   HGBA1C 6.0 (H) 09/05/2022   HGBA1C 6.2 (H) 02/03/2020   HGBA1C 5.3 10/28/2015   ESRSEDRATE 14 06/21/2021   ESRSEDRATE 17 05/18/2021   ESRSEDRATE 20 (H) 03/28/2020   CRP 19.2 (H) 06/21/2021   CRP 11.1 (H) 05/18/2021   CRP 2.7 (H) 03/28/2020   REPTSTATUS 09/20/2022 FINAL 09/15/2022   GRAMSTAIN  09/15/2022    RARE WBC PRESENT,BOTH PMN AND MONONUCLEAR NO ORGANISMS SEEN    CULT  09/15/2022    FEW STAPHYLOCOCCUS COHNII NO ANAEROBES ISOLATED Performed at [facility], [address] [place], [place] [address]    [facility] STAPHYLOCOCCUS COHNII 09/15/2022     Lab Results  Component Value Date   ALBUMIN 4.0 06/30/2022   ALBUMIN 3.0 (L) 03/26/2020   ALBUMIN 3.6 03/25/2020    Lab Results  Component Value Date   MG 1.8  05/22/2021   MG 2.0 02/12/2020   MG 1.7 02/11/2020   Lab Results  Component Value Date   VD25OH 28.61 (L) 05/05/2020    No results found for: "PREALBUMIN"    Latest Ref Rng & Units 08/16/2022    6:24 AM 03/10/2022   10:10 AM 02/03/2022    2:24 PM  CBC EXTENDED  WBC 4.0 - 10.5 K/uL 4.0  5.1  10.6   RBC 4.22 - 5.81 MIL/uL 4.29  4.80  4.84   Hemoglobin 13.0 - 17.0 g/dL 82.9  56.2  13.0   HCT 39.0 - 52.0 % 34.9  37.4  39.6   Platelets 150 - 400 K/uL 249  370  311      There is no height or weight on file to calculate BMI.  Orders:  No orders of the defined types were placed in this encounter.  No orders of the defined types were placed in this encounter.    Procedures: No procedures performed  Clinical Data: No additional findings.  ROS:  All other systems negative, except as noted in the HPI. Review of Systems  Objective: Vital Signs: There were no vitals taken for this visit.  Specialty Comments:  No specialty comments available.  PMFS History: Patient Active Problem List   Diagnosis Date Noted   Wound dehiscence, surgical 09/19/2022  Osteomyelitis of third toe of left foot (HCC) 08/16/2022   Depressed mood 06/20/2022   Dehiscence of amputation stump of left lower extremity (HCC) 03/10/2022   History of transmetatarsal amputation of right foot (HCC) 02/15/2022   Chronic osteomyelitis of toe, right (HCC) 02/10/2022   Fracture of intertrochanteric section of femur, closed, right, with nonunion, subsequent encounter 12/24/2020   Inguinal hernia 10/18/2020   Chronic combined systolic and diastolic CHF, NYHA class 2 (HCC)    Osteoporosis 05/05/2020   Foot infection    NICM (nonischemic cardiomyopathy) (HCC), no significant CAD on cardiac cath   02/12/2020   Right foot pain    Rheumatoid arthritis (HCC)    Atrial flutter (HCC) 02/02/2020   Lower extremity edema 12/30/2019   Weight loss, non-intentional 11/24/2019   Chronic right hip pain 06/19/2019    Degenerative tear of acetabular labrum of right hip 05/02/2019   Primary osteoarthritis of right hip 05/02/2019   Skin ulcer (HCC) 01/29/2019   Malignant neoplasm of prostate (HCC) 01/16/2018   Insomnia 10/02/2017   S/P bilateral TKA 09/13/2015   Other bilateral secondary osteoarthritis of knee 07/14/2014   Heme positive stool 05/21/2013   Osteoarthritis, multiple sites 01/13/2011   Hypertension 12/06/2010   CLAUSTROPHOBIA 03/01/2010   Past Medical History:  Diagnosis Date   Anemia    Atrial flutter (HCC) 01/2020   Cancer (HCC)    prostate   COVID 2021   mild case   Dysrhythmia    Aflutter   Heart failure with reduced ejection fraction (HCC)    Pt denies, it is in cardiologist notes.   Heart murmur    Hypertension    Hypoglycemia    occ   NICM (nonischemic cardiomyopathy) (HCC) 02/12/2020   Prostate cancer (HCC) 2019   Rheumatoid arthritis (HCC)     Family History  Problem Relation Age of Onset   Prostate cancer Father    Prostate cancer Brother    Colon cancer Neg Hx    Rectal cancer Neg Hx    Stomach cancer Neg Hx     Past Surgical History:  Procedure Laterality Date   AMPUTATION Right 02/10/2022   Procedure: RIGHT TRANSMETATARSAL AMPUTATION;  Surgeon: Nadara Mustard, MD;  Location: [facility] OR;  Service: Orthopedics;  Laterality: Right;   AMPUTATION Right 03/10/2022   Procedure: RIGHT FOOT CHOPART AMPUTATION;  Surgeon: Nadara Mustard, MD;  Location: [facility] OR;  Service: Orthopedics;  Laterality: Right;   AMPUTATION Left 08/16/2022   Procedure: LEFT 3RD TOE AMPUTATION;  Surgeon: Nadara Mustard, MD;  Location: [facility] OR;  Service: Orthopedics;  Laterality: Left;   AMPUTATION Left 09/13/2022   Procedure: LEFT TRANSMETATARSAL AMPUTATION;  Surgeon: Nadara Mustard, MD;  Location: [facility] OR;  Service: Orthopedics;  Laterality: Left;   AMPUTATION TOE Right 03/26/2020   Procedure: Right 3rd toe partial amputation, bone biopsy right 1st metatarsal;  Surgeon: Park Liter, DPM;  Location:  [facility] ORS;  Service: Podiatry;  Laterality: Right;   BUBBLE STUDY  02/11/2020   Procedure: BUBBLE STUDY;  Surgeon: Sande Rives, MD;  Location: [facility] ENDOSCOPY;  Service: Cardiovascular;;   CARDIOVERSION N/A 02/11/2020   Procedure: CARDIOVERSION;  Surgeon: Sande Rives, MD;  Location: [facility] ENDOSCOPY;  Service: Cardiovascular;  Laterality: N/A;   CARDIOVERSION N/A 08/31/2021   Procedure: CARDIOVERSION;  Surgeon: Jake Bathe, MD;  Location: [facility] ENDOSCOPY;  Service: Cardiovascular;  Laterality: N/A;   CYSTOSCOPY  04/11/2018   Procedure: Derinda Late;  Surgeon: Crist Fat, MD;  Location:  [facility];  Service: Urology;;  NO SEEDS FOUND IN BLADDER   HERNIA REPAIR  2009   inguinal   INTRAMEDULLARY (IM) NAIL INTERTROCHANTERIC Right 05/05/2020   Procedure: CPT 27245-Cephalomedullary nailing of right intertrochanteric femur fracture;  Surgeon: Roby Lofts, MD;  Location: [facility] OR;  Service: Orthopedics;  Laterality: Right;   INTRAMEDULLARY (IM) NAIL INTERTROCHANTERIC Right 12/24/2020   Procedure: REVISION FIXATION OF RIGHT INTERTROCHANTERIC FEMUR NONUNION;  Surgeon: Roby Lofts, MD;  Location: [facility] OR;  Service: Orthopedics;  Laterality: Right;   IRRIGATION AND DEBRIDEMENT FOOT Left 03/26/2020   Procedure: Left foot incision and drainage with removal of all non-viable soft tissue and bone - areas overlying the 2nd and 5th metatarsals.;  Surgeon: Park Liter, DPM;  Location: [facility] ORS;  Service: Podiatry;  Laterality: Left;   RADIOACTIVE SEED IMPLANT N/A 04/11/2018   Procedure: RADIOACTIVE SEED IMPLANT/BRACHYTHERAPY IMPLANT;  Surgeon: Crist Fat, MD;  Location: [facility];  Service: Urology;  Laterality: N/A;   71     SEEDS IMPLANTED   RIGHT/LEFT HEART CATH AND CORONARY ANGIOGRAPHY N/A 02/09/2020   Procedure: RIGHT/LEFT HEART CATH AND CORONARY ANGIOGRAPHY;  Surgeon: Yvonne Kendall, MD;  Location: [facility] INVASIVE CV LAB;  Service:  Cardiovascular;  Laterality: N/A;   SPACE OAR INSTILLATION N/A 04/11/2018   Procedure: SPACE OAR INSTILLATION;  Surgeon: Crist Fat, MD;  Location: [facility];  Service: Urology;  Laterality: N/A;   STUMP REVISION Left 09/15/2022   Procedure: REVISION LEFT TRANSMETATARSAL AMPUTATION;  Surgeon: Nadara Mustard, MD;  Location: [facility] OR;  Service: Orthopedics;  Laterality: Left;   TEE WITHOUT CARDIOVERSION N/A 02/11/2020   Procedure: TRANSESOPHAGEAL ECHOCARDIOGRAM (TEE);  Surgeon: Sande Rives, MD;  Location: [facility] ENDOSCOPY;  Service: Cardiovascular;  Laterality: N/A;   TOTAL KNEE ARTHROPLASTY Bilateral 09/13/2015   Procedure: BILATERAL KNEE ARTHROPLASTY ;  Surgeon: Durene Romans, MD;  Location: [facility] ORS;  Service: Orthopedics;  Laterality: Bilateral;   Social History   Occupational History   Not on file  Tobacco Use   Smoking status: Never   Smokeless tobacco: Never  Vaping Use   Vaping Use: Never used  Substance and Sexual Activity   Alcohol use: Not Currently    Alcohol/week: 7.0 standard drinks of alcohol    Types: 7 Cans of beer per week   Drug use: Not Currently   Sexual activity: Yes

## 2022-09-26 ENCOUNTER — Ambulatory Visit (INDEPENDENT_AMBULATORY_CARE_PROVIDER_SITE_OTHER): Payer: Medicare Other | Admitting: Orthopedic Surgery

## 2022-09-26 DIAGNOSIS — T8781 Dehiscence of amputation stump: Secondary | ICD-10-CM

## 2022-09-26 DIAGNOSIS — Z89431 Acquired absence of right foot: Secondary | ICD-10-CM

## 2022-09-27 ENCOUNTER — Encounter: Payer: Medicare Other | Admitting: Vascular Surgery

## 2022-09-27 ENCOUNTER — Ambulatory Visit (HOSPITAL_COMMUNITY): Payer: Medicare Other

## 2022-10-06 ENCOUNTER — Encounter: Payer: Self-pay | Admitting: Orthopedic Surgery

## 2022-10-06 NOTE — Progress Notes (Signed)
Office Visit Note   Patient: Thomas [name]           Date of Birth: [birth year]           MRN: [id number] Visit Date: 09/26/2022              Requested by: Carney Living, MD [address] [place],  [place] [address] PCP: Carney Living, MD  Chief Complaint  Patient presents with   Left Foot - Routine Post Op    09/15/2022 revision left transmet amputation       HPI: Patient is a 72 year old gentleman who is 2 weeks status post revision left transtibial amputation.  Patient is on Bactrim DS and dry dressing changes daily.  Patient states he has some clear serosanguineous drainage.  Assessment & Plan: Visit Diagnoses:  1. Dehiscence of amputation stump of right lower extremity (HCC)   2. History of Chopart amputation of right foot (HCC)     Plan: Patient is showing improvement along the surgical incision.  Continue with protected weightbearing harvest sutures at follow-up.  Follow-Up Instructions: Return in about 2 weeks (around 10/10/2022).   Ortho Exam  Patient is alert, oriented, no adenopathy, well-dressed, normal affect, normal respiratory effort. Examination the wound is much improved there is no cellulitis no maceration there is slight gaping of the surgical incision.  Imaging: No results found.   Labs: Lab Results  Component Value Date   HGBA1C 6.0 (H) 09/05/2022   HGBA1C 6.2 (H) 02/03/2020   HGBA1C 5.3 10/28/2015   ESRSEDRATE 14 06/21/2021   ESRSEDRATE 17 05/18/2021   ESRSEDRATE 20 (H) 03/28/2020   CRP 19.2 (H) 06/21/2021   CRP 11.1 (H) 05/18/2021   CRP 2.7 (H) 03/28/2020   REPTSTATUS 09/20/2022 FINAL 09/15/2022   GRAMSTAIN  09/15/2022    RARE WBC PRESENT,BOTH PMN AND MONONUCLEAR NO ORGANISMS SEEN    CULT  09/15/2022    FEW STAPHYLOCOCCUS COHNII NO ANAEROBES ISOLATED Performed at [facility], [address] [place], [place] [address]    [facility] STAPHYLOCOCCUS COHNII 09/15/2022     Lab Results  Component Value  Date   ALBUMIN 4.0 06/30/2022   ALBUMIN 3.0 (L) 03/26/2020   ALBUMIN 3.6 03/25/2020    Lab Results  Component Value Date   MG 1.8 05/22/2021   MG 2.0 02/12/2020   MG 1.7 02/11/2020   Lab Results  Component Value Date   VD25OH 28.61 (L) 05/05/2020    No results found for: "PREALBUMIN"    Latest Ref Rng & Units 08/16/2022    6:24 AM 03/10/2022   10:10 AM 02/03/2022    2:24 PM  CBC EXTENDED  WBC 4.0 - 10.5 K/uL 4.0  5.1  10.6   RBC 4.22 - 5.81 MIL/uL 4.29  4.80  4.84   Hemoglobin 13.0 - 17.0 g/dL 28.4  13.2  44.0   HCT 39.0 - 52.0 % 34.9  37.4  39.6   Platelets 150 - 400 K/uL 249  370  311      There is no height or weight on file to calculate BMI.  Orders:  No orders of the defined types were placed in this encounter.  No orders of the defined types were placed in this encounter.    Procedures: No procedures performed  Clinical Data: No additional findings.  ROS:  All other systems negative, except as noted in the HPI. Review of Systems  Objective: Vital Signs: There were no vitals taken for this visit.  Specialty Comments:  No specialty comments available.  PMFS History: Patient Active Problem List   Diagnosis Date Noted   Wound dehiscence, surgical 09/19/2022   Osteomyelitis of third toe of left foot (HCC) 08/16/2022   Depressed mood 06/20/2022   Dehiscence of amputation stump of left lower extremity (HCC) 03/10/2022   History of transmetatarsal amputation of right foot (HCC) 02/15/2022   Chronic osteomyelitis of toe, right (HCC) 02/10/2022   Fracture of intertrochanteric section of femur, closed, right, with nonunion, subsequent encounter 12/24/2020   Inguinal hernia 10/18/2020   Chronic combined systolic and diastolic CHF, NYHA class 2 (HCC)    Osteoporosis 05/05/2020   Foot infection    NICM (nonischemic cardiomyopathy) (HCC), no significant CAD on cardiac cath   02/12/2020   Right foot pain    Rheumatoid arthritis (HCC)    Atrial flutter  (HCC) 02/02/2020   Lower extremity edema 12/30/2019   Weight loss, non-intentional 11/24/2019   Chronic right hip pain 06/19/2019   Degenerative tear of acetabular labrum of right hip 05/02/2019   Primary osteoarthritis of right hip 05/02/2019   Skin ulcer (HCC) 01/29/2019   Malignant neoplasm of prostate (HCC) 01/16/2018   Insomnia 10/02/2017   S/P bilateral TKA 09/13/2015   Other bilateral secondary osteoarthritis of knee 07/14/2014   Heme positive stool 05/21/2013   Osteoarthritis, multiple sites 01/13/2011   Hypertension 12/06/2010   CLAUSTROPHOBIA 03/01/2010   Past Medical History:  Diagnosis Date   Anemia    Atrial flutter (HCC) 01/2020   Cancer (HCC)    prostate   COVID 2021   mild case   Dysrhythmia    Aflutter   Heart failure with reduced ejection fraction (HCC)    Pt denies, it is in cardiologist notes.   Heart murmur    Hypertension    Hypoglycemia    occ   NICM (nonischemic cardiomyopathy) (HCC) 02/12/2020   Prostate cancer (HCC) 2019   Rheumatoid arthritis (HCC)     Family History  Problem Relation Age of Onset   Prostate cancer Father    Prostate cancer Brother    Colon cancer Neg Hx    Rectal cancer Neg Hx    Stomach cancer Neg Hx     Past Surgical History:  Procedure Laterality Date   AMPUTATION Right 02/10/2022   Procedure: RIGHT TRANSMETATARSAL AMPUTATION;  Surgeon: Nadara Mustard, MD;  Location: [facility] OR;  Service: Orthopedics;  Laterality: Right;   AMPUTATION Right 03/10/2022   Procedure: RIGHT FOOT CHOPART AMPUTATION;  Surgeon: Nadara Mustard, MD;  Location: [facility] OR;  Service: Orthopedics;  Laterality: Right;   AMPUTATION Left 08/16/2022   Procedure: LEFT 3RD TOE AMPUTATION;  Surgeon: Nadara Mustard, MD;  Location: [facility] OR;  Service: Orthopedics;  Laterality: Left;   AMPUTATION Left 09/13/2022   Procedure: LEFT TRANSMETATARSAL AMPUTATION;  Surgeon: Nadara Mustard, MD;  Location: [facility] OR;  Service: Orthopedics;  Laterality: Left;   AMPUTATION TOE Right  03/26/2020   Procedure: Right 3rd toe partial amputation, bone biopsy right 1st metatarsal;  Surgeon: Park Liter, DPM;  Location: [facility] ORS;  Service: Podiatry;  Laterality: Right;   BUBBLE STUDY  02/11/2020   Procedure: BUBBLE STUDY;  Surgeon: Sande Rives, MD;  Location: [facility] ENDOSCOPY;  Service: Cardiovascular;;   CARDIOVERSION N/A 02/11/2020   Procedure: CARDIOVERSION;  Surgeon: Sande Rives, MD;  Location: [facility] ENDOSCOPY;  Service: Cardiovascular;  Laterality: N/A;   CARDIOVERSION N/A 08/31/2021   Procedure: CARDIOVERSION;  Surgeon: Jake Bathe, MD;  Location: [facility] ENDOSCOPY;  Service: Cardiovascular;  Laterality: N/A;   CYSTOSCOPY  04/11/2018   Procedure: CYSTOSCOPY FLEXIBLE;  Surgeon: Crist Fat, MD;  Location: [facility];  Service: Urology;;  NO SEEDS FOUND IN BLADDER   HERNIA REPAIR  2009   inguinal   INTRAMEDULLARY (IM) NAIL INTERTROCHANTERIC Right 05/05/2020   Procedure: CPT 27245-Cephalomedullary nailing of right intertrochanteric femur fracture;  Surgeon: Roby Lofts, MD;  Location: [facility] OR;  Service: Orthopedics;  Laterality: Right;   INTRAMEDULLARY (IM) NAIL INTERTROCHANTERIC Right 12/24/2020   Procedure: REVISION FIXATION OF RIGHT INTERTROCHANTERIC FEMUR NONUNION;  Surgeon: Roby Lofts, MD;  Location: [facility] OR;  Service: Orthopedics;  Laterality: Right;   IRRIGATION AND DEBRIDEMENT FOOT Left 03/26/2020   Procedure: Left foot incision and drainage with removal of all non-viable soft tissue and bone - areas overlying the 2nd and 5th metatarsals.;  Surgeon: Park Liter, DPM;  Location: [facility];  Service: Podiatry;  Laterality: Left;   RADIOACTIVE SEED IMPLANT N/A 04/11/2018   Procedure: RADIOACTIVE SEED IMPLANT/BRACHYTHERAPY IMPLANT;  Surgeon: Crist Fat, MD;  Location: [facility];  Service: Urology;  Laterality: N/A;   71     SEEDS IMPLANTED   RIGHT/LEFT HEART CATH AND CORONARY ANGIOGRAPHY N/A 02/09/2020    Procedure: RIGHT/LEFT HEART CATH AND CORONARY ANGIOGRAPHY;  Surgeon: Yvonne Kendall, MD;  Location: [facility];  Service: Cardiovascular;  Laterality: N/A;   SPACE OAR INSTILLATION N/A 04/11/2018   Procedure: SPACE OAR INSTILLATION;  Surgeon: Crist Fat, MD;  Location: [facility];  Service: Urology;  Laterality: N/A;   STUMP REVISION Left 09/15/2022   Procedure: REVISION LEFT TRANSMETATARSAL AMPUTATION;  Surgeon: Nadara Mustard, MD;  Location: [facility] OR;  Service: Orthopedics;  Laterality: Left;   TEE WITHOUT CARDIOVERSION N/A 02/11/2020   Procedure: TRANSESOPHAGEAL ECHOCARDIOGRAM (TEE);  Surgeon: Sande Rives, MD;  Location: [facility] ENDOSCOPY;  Service: Cardiovascular;  Laterality: N/A;   TOTAL KNEE ARTHROPLASTY Bilateral 09/13/2015   Procedure: BILATERAL KNEE ARTHROPLASTY ;  Surgeon: Durene Romans, MD;  Location: [facility];  Service: Orthopedics;  Laterality: Bilateral;   Social History   Occupational History   Not on file  Tobacco Use   Smoking status: Never   Smokeless tobacco: Never  Vaping Use   Vaping Use: Never used  Substance and Sexual Activity   Alcohol use: Not Currently    Alcohol/week: 7.0 standard drinks of alcohol    Types: 7 Cans of beer per week   Drug use: Not Currently   Sexual activity: Yes

## 2022-10-10 ENCOUNTER — Ambulatory Visit (INDEPENDENT_AMBULATORY_CARE_PROVIDER_SITE_OTHER): Payer: Medicare Other | Admitting: Orthopedic Surgery

## 2022-10-10 DIAGNOSIS — T8781 Dehiscence of amputation stump: Secondary | ICD-10-CM

## 2022-10-17 ENCOUNTER — Encounter: Payer: Self-pay | Admitting: Orthopedic Surgery

## 2022-10-17 ENCOUNTER — Ambulatory Visit (INDEPENDENT_AMBULATORY_CARE_PROVIDER_SITE_OTHER): Payer: Medicare Other | Admitting: Orthopedic Surgery

## 2022-10-17 DIAGNOSIS — T8781 Dehiscence of amputation stump: Secondary | ICD-10-CM

## 2022-10-17 NOTE — Progress Notes (Signed)
Office Visit Note   Patient: Thomas [name]           Date of Birth: July 11, 1950           MRN: [id number] Visit Date: 10/17/2022              Requested by: Carney Living, MD [address] [place],  [place] [address] PCP: Carney Living, MD  Chief Complaint  Patient presents with   Left Foot - Routine Post Op    09/15/2022 revision left transmet amputation       HPI: Patient is a 72 year old gentleman status post revision left transmetatarsal amputation approximately 4 weeks ago.  Patient is full weightbearing with a cane.  He states he is using the kneeling scooter at home and elevating his leg.  Assessment & Plan: Visit Diagnoses:  1. Dehiscence of amputation stump of right lower extremity (HCC)     Plan: Patient will complete his antibiotics no signs of infection.  Continue Dial soap cleansing dry dressing changes.  Sutures out in 2 weeks.  Follow-Up Instructions: Return in about 2 weeks (around 10/31/2022).   Ortho Exam  Patient is alert, oriented, no adenopathy, well-dressed, normal affect, normal respiratory effort. Examination there is some dependent redness with elevation and massage the redness resolves.  The foot is not tender to palpation the wound is gaped open about a centimeter but is flat.  Imaging: No results found. No images are attached to the encounter.  Labs: Lab Results  Component Value Date   HGBA1C 6.0 (H) 09/05/2022   HGBA1C 6.2 (H) 02/03/2020   HGBA1C 5.3 10/28/2015   ESRSEDRATE 14 06/21/2021   ESRSEDRATE 17 05/18/2021   ESRSEDRATE 20 (H) 03/28/2020   CRP 19.2 (H) 06/21/2021   CRP 11.1 (H) 05/18/2021   CRP 2.7 (H) 03/28/2020   REPTSTATUS 09/20/2022 FINAL 09/15/2022   GRAMSTAIN  09/15/2022    RARE WBC PRESENT,BOTH PMN AND MONONUCLEAR NO ORGANISMS SEEN    CULT  09/15/2022    FEW STAPHYLOCOCCUS COHNII NO ANAEROBES ISOLATED Performed at [facility], [address], [place], [place] [address]    [facility]  STAPHYLOCOCCUS COHNII 09/15/2022     Lab Results  Component Value Date   ALBUMIN 4.0 06/30/2022   ALBUMIN 3.0 (L) 03/26/2020   ALBUMIN 3.6 03/25/2020    Lab Results  Component Value Date   MG 1.8 05/22/2021   MG 2.0 02/12/2020   MG 1.7 02/11/2020   Lab Results  Component Value Date   VD25OH 28.61 (L) 05/05/2020    No results found for: "PREALBUMIN"    Latest Ref Rng & Units 08/16/2022    6:24 AM 03/10/2022   10:10 AM 02/03/2022    2:24 PM  CBC EXTENDED  WBC 4.0 - 10.5 K/uL 4.0  5.1  10.6   RBC 4.22 - 5.81 MIL/uL 4.29  4.80  4.84   Hemoglobin 13.0 - 17.0 g/dL 86.5  78.4  69.6   HCT 39.0 - 52.0 % 34.9  37.4  39.6   Platelets 150 - 400 K/uL 249  370  311      There is no height or weight on file to calculate BMI.  Orders:  No orders of the defined types were placed in this encounter.  No orders of the defined types were placed in this encounter.    Procedures: No procedures performed  Clinical Data: No additional findings.  ROS:  All other systems negative, except as noted in the  HPI. Review of Systems  Objective: Vital Signs: There were no vitals taken for this visit.  Specialty Comments:  No specialty comments available.  PMFS History: Patient Active Problem List   Diagnosis Date Noted   Wound dehiscence, surgical 09/19/2022   Osteomyelitis of third toe of left foot (HCC) 08/16/2022   Depressed mood 06/20/2022   Dehiscence of amputation stump of left lower extremity (HCC) 03/10/2022   History of transmetatarsal amputation of right foot (HCC) 02/15/2022   Chronic osteomyelitis of toe, right (HCC) 02/10/2022   Fracture of intertrochanteric section of femur, closed, right, with nonunion, subsequent encounter 12/24/2020   Inguinal hernia 10/18/2020   Chronic combined systolic and diastolic CHF, NYHA class 2 (HCC)    Osteoporosis 05/05/2020   Foot infection    NICM (nonischemic cardiomyopathy) (HCC), no significant CAD on cardiac cath   02/12/2020    Right foot pain    Rheumatoid arthritis (HCC)    Atrial flutter (HCC) 02/02/2020   Lower extremity edema 12/30/2019   Weight loss, non-intentional 11/24/2019   Chronic right hip pain 06/19/2019   Degenerative tear of acetabular labrum of right hip 05/02/2019   Primary osteoarthritis of right hip 05/02/2019   Skin ulcer (HCC) 01/29/2019   Malignant neoplasm of prostate (HCC) 01/16/2018   Insomnia 10/02/2017   S/P bilateral TKA 09/13/2015   Other bilateral secondary osteoarthritis of knee 07/14/2014   Heme positive stool 05/21/2013   Osteoarthritis, multiple sites 01/13/2011   Hypertension 12/06/2010   CLAUSTROPHOBIA 03/01/2010   Past Medical History:  Diagnosis Date   Anemia    Atrial flutter (HCC) 01/2020   Cancer (HCC)    prostate   COVID 2021   mild case   Dysrhythmia    Aflutter   Heart failure with reduced ejection fraction (HCC)    Pt denies, it is in cardiologist notes.   Heart murmur    Hypertension    Hypoglycemia    occ   NICM (nonischemic cardiomyopathy) (HCC) 02/12/2020   Prostate cancer (HCC) 2019   Rheumatoid arthritis (HCC)     Family History  Problem Relation Age of Onset   Prostate cancer Father    Prostate cancer Brother    Colon cancer Neg Hx    Rectal cancer Neg Hx    Stomach cancer Neg Hx     Past Surgical History:  Procedure Laterality Date   AMPUTATION Right 02/10/2022   Procedure: RIGHT TRANSMETATARSAL AMPUTATION;  Surgeon: Nadara Mustard, MD;  Location: [facility] OR;  Service: Orthopedics;  Laterality: Right;   AMPUTATION Right 03/10/2022   Procedure: RIGHT FOOT CHOPART AMPUTATION;  Surgeon: Nadara Mustard, MD;  Location: [facility] OR;  Service: Orthopedics;  Laterality: Right;   AMPUTATION Left 08/16/2022   Procedure: LEFT 3RD TOE AMPUTATION;  Surgeon: Nadara Mustard, MD;  Location: [facility] OR;  Service: Orthopedics;  Laterality: Left;   AMPUTATION Left 09/13/2022   Procedure: LEFT TRANSMETATARSAL AMPUTATION;  Surgeon: Nadara Mustard, MD;  Location: [facility]  OR;  Service: Orthopedics;  Laterality: Left;   AMPUTATION TOE Right 03/26/2020   Procedure: Right 3rd toe partial amputation, bone biopsy right 1st metatarsal;  Surgeon: Park Liter, DPM;  Location: [facility];  Service: Podiatry;  Laterality: Right;   BUBBLE STUDY  02/11/2020   Procedure: BUBBLE STUDY;  Surgeon: Sande Rives, MD;  Location: [facility] ENDOSCOPY;  Service: Cardiovascular;;   CARDIOVERSION N/A 02/11/2020   Procedure: CARDIOVERSION;  Surgeon: Sande Rives, MD;  Location: [facility] ENDOSCOPY;  Service: Cardiovascular;  Laterality:  N/A;   CARDIOVERSION N/A 08/31/2021   Procedure: CARDIOVERSION;  Surgeon: Jake Bathe, MD;  Location: [facility] ENDOSCOPY;  Service: Cardiovascular;  Laterality: N/A;   CYSTOSCOPY  04/11/2018   Procedure: Derinda Late;  Surgeon: Crist Fat, MD;  Location: [facility];  Service: Urology;;  NO SEEDS FOUND IN BLADDER   HERNIA REPAIR  2009   inguinal   INTRAMEDULLARY (IM) NAIL INTERTROCHANTERIC Right 05/05/2020   Procedure: CPT 27245-Cephalomedullary nailing of right intertrochanteric femur fracture;  Surgeon: Roby Lofts, MD;  Location: [facility] OR;  Service: Orthopedics;  Laterality: Right;   INTRAMEDULLARY (IM) NAIL INTERTROCHANTERIC Right 12/24/2020   Procedure: REVISION FIXATION OF RIGHT INTERTROCHANTERIC FEMUR NONUNION;  Surgeon: Roby Lofts, MD;  Location: [facility] OR;  Service: Orthopedics;  Laterality: Right;   IRRIGATION AND DEBRIDEMENT FOOT Left 03/26/2020   Procedure: Left foot incision and drainage with removal of all non-viable soft tissue and bone - areas overlying the 2nd and 5th metatarsals.;  Surgeon: Park Liter, DPM;  Location: [facility];  Service: Podiatry;  Laterality: Left;   RADIOACTIVE SEED IMPLANT N/A 04/11/2018   Procedure: RADIOACTIVE SEED IMPLANT/BRACHYTHERAPY IMPLANT;  Surgeon: Crist Fat, MD;  [facility];  Service: Urology;  Laterality: N/A;   69     SEEDS IMPLANTED    RIGHT/LEFT HEART CATH AND CORONARY ANGIOGRAPHY N/A 02/09/2020   Procedure: RIGHT/LEFT HEART CATH AND CORONARY ANGIOGRAPHY;  Surgeon: Yvonne Kendall, MD;  Location: [facility] LAB;  Service: Cardiovascular;  Laterality: N/A;   SPACE OAR INSTILLATION N/A 04/11/2018   Procedure: SPACE OAR INSTILLATION;  Surgeon: Crist Fat, MD;  Location: [facility];  Service: Urology;  Laterality: N/A;   STUMP REVISION Left 09/15/2022   Procedure: REVISION LEFT TRANSMETATARSAL AMPUTATION;  Surgeon: Nadara Mustard, MD;  Location: [facility] OR;  Service: Orthopedics;  Laterality: Left;   TEE WITHOUT CARDIOVERSION N/A 02/11/2020   Procedure: TRANSESOPHAGEAL ECHOCARDIOGRAM (TEE);  Surgeon: Sande Rives, MD;  Location: [facility] ENDOSCOPY;  Service: Cardiovascular;  Laterality: N/A;   TOTAL KNEE ARTHROPLASTY Bilateral 09/13/2015   Procedure: BILATERAL KNEE ARTHROPLASTY ;  Surgeon: Durene Romans, MD;  Location: [facility];  Service: Orthopedics;  Laterality: Bilateral;   Social History   Occupational History   Not on file  Tobacco Use   Smoking status: Never   Smokeless tobacco: Never  Vaping Use   Vaping Use: Never used  Substance and Sexual Activity   Alcohol use: Not Currently    Alcohol/week: 7.0 standard drinks of alcohol    Types: 7 Cans of beer per week   Drug use: Not Currently   Sexual activity: Yes

## 2022-10-19 ENCOUNTER — Encounter: Payer: Self-pay | Admitting: Orthopedic Surgery

## 2022-10-19 NOTE — Progress Notes (Signed)
Office Visit Note   Patient: Thomas [name]           Date of Birth: 04-06-1951           MRN: [id number] Visit Date: 10/10/2022              Requested by: Carney Living, MD [address] [place],  [place] [address] PCP: Carney Living, MD  Chief Complaint  Patient presents with   Left Foot - Routine Post Op    09/15/2022 left TMA revision       HPI: Patient is a 72 year old gentleman who is seen in follow-up for revision left transmetatarsal amputation.  He is weightbearing with a cane and a postoperative shoe he is on oxycodone 30 mg 4 hip pain.  He is on Eliquis 5 mg.  Assessment & Plan: Visit Diagnoses:  1. Dehiscence of amputation stump of right lower extremity (HCC)     Plan: Patient has increased swelling.  Recommended elevation and compression.  Minimize weightbearing.  Follow-Up Instructions: Return in about 1 week (around 10/17/2022).   Ortho Exam  Patient is alert, oriented, no adenopathy, well-dressed, normal affect, normal respiratory effort. Examination the wound is healing there is increased swelling no dehiscence.  Imaging: No results found. No images are attached to the encounter.  Labs: Lab Results  Component Value Date   HGBA1C 6.0 (H) 09/05/2022   HGBA1C 6.2 (H) 02/03/2020   HGBA1C 5.3 10/28/2015   ESRSEDRATE 14 06/21/2021   ESRSEDRATE 17 05/18/2021   ESRSEDRATE 20 (H) 03/28/2020   CRP 19.2 (H) 06/21/2021   CRP 11.1 (H) 05/18/2021   CRP 2.7 (H) 03/28/2020   REPTSTATUS 09/20/2022 FINAL 09/15/2022   GRAMSTAIN  09/15/2022    RARE WBC PRESENT,BOTH PMN AND MONONUCLEAR NO ORGANISMS SEEN    CULT  09/15/2022    FEW STAPHYLOCOCCUS COHNII NO ANAEROBES ISOLATED Performed at [facility], [address], [place], [place] [address]    [facility] STAPHYLOCOCCUS COHNII 09/15/2022     Lab Results  Component Value Date   ALBUMIN 4.0 06/30/2022   ALBUMIN 3.0 (L) 03/26/2020   ALBUMIN 3.6 03/25/2020    Lab Results   Component Value Date   MG 1.8 05/22/2021   MG 2.0 02/12/2020   MG 1.7 02/11/2020   Lab Results  Component Value Date   VD25OH 28.61 (L) 05/05/2020    No results found for: "PREALBUMIN"    Latest Ref Rng & Units 08/16/2022    6:24 AM 03/10/2022   10:10 AM 02/03/2022    2:24 PM  CBC EXTENDED  WBC 4.0 - 10.5 K/uL 4.0  5.1  10.6   RBC 4.22 - 5.81 MIL/uL 4.29  4.80  4.84   Hemoglobin 13.0 - 17.0 g/dL 10.2  72.5  36.6   HCT 39.0 - 52.0 % 34.9  37.4  39.6   Platelets 150 - 400 K/uL 249  370  311      There is no height or weight on file to calculate BMI.  Orders:  No orders of the defined types were placed in this encounter.  No orders of the defined types were placed in this encounter.    Procedures: No procedures performed  Clinical Data: No additional findings.  ROS:  All other systems negative, except as noted in the HPI. Review of Systems  Objective: Vital Signs: There were no vitals taken for this visit.  Specialty Comments:  No specialty comments available.  PMFS History: Patient Active  Problem List   Diagnosis Date Noted   Wound dehiscence, surgical 09/19/2022   Osteomyelitis of third toe of left foot (HCC) 08/16/2022   Depressed mood 06/20/2022   Dehiscence of amputation stump of left lower extremity (HCC) 03/10/2022   History of transmetatarsal amputation of right foot (HCC) 02/15/2022   Chronic osteomyelitis of toe, right (HCC) 02/10/2022   Fracture of intertrochanteric section of femur, closed, right, with nonunion, subsequent encounter 12/24/2020   Inguinal hernia 10/18/2020   Chronic combined systolic and diastolic CHF, NYHA class 2 (HCC)    Osteoporosis 05/05/2020   Foot infection    NICM (nonischemic cardiomyopathy) (HCC), no significant CAD on cardiac cath   02/12/2020   Right foot pain    Rheumatoid arthritis (HCC)    Atrial flutter (HCC) 02/02/2020   Lower extremity edema 12/30/2019   Weight loss, non-intentional 11/24/2019   Chronic  right hip pain 06/19/2019   Degenerative tear of acetabular labrum of right hip 05/02/2019   Primary osteoarthritis of right hip 05/02/2019   Skin ulcer (HCC) 01/29/2019   Malignant neoplasm of prostate (HCC) 01/16/2018   Insomnia 10/02/2017   S/P bilateral TKA 09/13/2015   Other bilateral secondary osteoarthritis of knee 07/14/2014   Heme positive stool 05/21/2013   Osteoarthritis, multiple sites 01/13/2011   Hypertension 12/06/2010   CLAUSTROPHOBIA 03/01/2010   Past Medical History:  Diagnosis Date   Anemia    Atrial flutter (HCC) 01/2020   Cancer (HCC)    prostate   COVID 2021   mild case   Dysrhythmia    Aflutter   Heart failure with reduced ejection fraction (HCC)    Pt denies, it is in cardiologist notes.   Heart murmur    Hypertension    Hypoglycemia    occ   NICM (nonischemic cardiomyopathy) (HCC) 02/12/2020   Prostate cancer (HCC) 2019   Rheumatoid arthritis (HCC)     Family History  Problem Relation Age of Onset   Prostate cancer Father    Prostate cancer Brother    Colon cancer Neg Hx    Rectal cancer Neg Hx    Stomach cancer Neg Hx     Past Surgical History:  Procedure Laterality Date   AMPUTATION Right 02/10/2022   Procedure: RIGHT TRANSMETATARSAL AMPUTATION;  Surgeon: Nadara Mustard, MD;  Location: [facility] OR;  Service: Orthopedics;  Laterality: Right;   AMPUTATION Right 03/10/2022   Procedure: RIGHT FOOT CHOPART AMPUTATION;  Surgeon: Nadara Mustard, MD;  Location: [facility] OR;  Service: Orthopedics;  Laterality: Right;   AMPUTATION Left 08/16/2022   Procedure: LEFT 3RD TOE AMPUTATION;  Surgeon: Nadara Mustard, MD;  Location: [facility] OR;  Service: Orthopedics;  Laterality: Left;   AMPUTATION Left 09/13/2022   Procedure: LEFT TRANSMETATARSAL AMPUTATION;  Surgeon: Nadara Mustard, MD;  Location: [facility] OR;  Service: Orthopedics;  Laterality: Left;   AMPUTATION TOE Right 03/26/2020   Procedure: Right 3rd toe partial amputation, bone biopsy right 1st metatarsal;  Surgeon:  Park Liter, DPM;  Location: [facility] ORS;  Service: Podiatry;  Laterality: Right;   BUBBLE STUDY  02/11/2020   Procedure: BUBBLE STUDY;  Surgeon: Sande Rives, MD;  Location: [facility] ENDOSCOPY;  Service: Cardiovascular;;   CARDIOVERSION N/A 02/11/2020   Procedure: CARDIOVERSION;  Surgeon: Sande Rives, MD;  Location: [facility] ENDOSCOPY;  Service: Cardiovascular;  Laterality: N/A;   CARDIOVERSION N/A 08/31/2021   Procedure: CARDIOVERSION;  Surgeon: Jake Bathe, MD;  Location: [facility] ENDOSCOPY;  Service: Cardiovascular;  Laterality: N/A;   CYSTOSCOPY  04/11/2018   Procedure: CYSTOSCOPY FLEXIBLE;  Surgeon: Crist Fat, MD;  Location: [facility];  Service: Urology;;  NO SEEDS FOUND IN BLADDER   HERNIA REPAIR  2009   inguinal   INTRAMEDULLARY (IM) NAIL INTERTROCHANTERIC Right 05/05/2020   Procedure: CPT 27245-Cephalomedullary nailing of right intertrochanteric femur fracture;  Surgeon: Roby Lofts, MD;  Location: [facility] OR;  Service: Orthopedics;  Laterality: Right;   INTRAMEDULLARY (IM) NAIL INTERTROCHANTERIC Right 12/24/2020   Procedure: REVISION FIXATION OF RIGHT INTERTROCHANTERIC FEMUR NONUNION;  Surgeon: Roby Lofts, MD;  Location: [facility] OR;  Service: Orthopedics;  Laterality: Right;   IRRIGATION AND DEBRIDEMENT FOOT Left 03/26/2020   Procedure: Left foot incision and drainage with removal of all non-viable soft tissue and bone - areas overlying the 2nd and 5th metatarsals.;  Surgeon: Park Liter, DPM;  Location: [facility] ORS;  Service: Podiatry;  Laterality: Left;   RADIOACTIVE SEED IMPLANT N/A 04/11/2018   Procedure: RADIOACTIVE SEED IMPLANT/BRACHYTHERAPY IMPLANT;  Surgeon: Crist Fat, MD;  Location: [facility];  Service: Urology;  Laterality: N/A;   71     SEEDS IMPLANTED   RIGHT/LEFT HEART CATH AND CORONARY ANGIOGRAPHY N/A 02/09/2020   Procedure: RIGHT/LEFT HEART CATH AND CORONARY ANGIOGRAPHY;  Surgeon: Yvonne Kendall, MD;  Location: [facility]  INVASIVE CV LAB;  Service: Cardiovascular;  Laterality: N/A;   SPACE OAR INSTILLATION N/A 04/11/2018   Procedure: SPACE OAR INSTILLATION;  Surgeon: Crist Fat, MD;  Location: [facility];  Service: Urology;  Laterality: N/A;   STUMP REVISION Left 09/15/2022   Procedure: REVISION LEFT TRANSMETATARSAL AMPUTATION;  Surgeon: Nadara Mustard, MD;  Location: [facility] OR;  Service: Orthopedics;  Laterality: Left;   TEE WITHOUT CARDIOVERSION N/A 02/11/2020   Procedure: TRANSESOPHAGEAL ECHOCARDIOGRAM (TEE);  Surgeon: Sande Rives, MD;  Location: [facility] ENDOSCOPY;  Service: Cardiovascular;  Laterality: N/A;   TOTAL KNEE ARTHROPLASTY Bilateral 09/13/2015   Procedure: BILATERAL KNEE ARTHROPLASTY ;  Surgeon: Durene Romans, MD;  Location: [facility] ORS;  Service: Orthopedics;  Laterality: Bilateral;   Social History   Occupational History   Not on file  Tobacco Use   Smoking status: Never   Smokeless tobacco: Never  Vaping Use   Vaping Use: Never used  Substance and Sexual Activity   Alcohol use: Not Currently    Alcohol/week: 7.0 standard drinks of alcohol    Types: 7 Cans of beer per week   Drug use: Not Currently   Sexual activity: Yes

## 2022-10-24 ENCOUNTER — Ambulatory Visit: Payer: Medicare Other | Admitting: Family Medicine

## 2022-10-28 ENCOUNTER — Emergency Department (HOSPITAL_COMMUNITY): Payer: Medicare Other

## 2022-10-28 ENCOUNTER — Inpatient Hospital Stay (HOSPITAL_COMMUNITY): Payer: Medicare Other

## 2022-10-28 ENCOUNTER — Inpatient Hospital Stay (HOSPITAL_COMMUNITY)
Admission: EM | Admit: 2022-10-28 | Discharge: 2022-10-31 | DRG: 948 | Disposition: A | Discharge status: Home / Self Care | Payer: Medicare Other | Attending: Family Medicine | Admitting: Family Medicine

## 2022-10-28 ENCOUNTER — Other Ambulatory Visit: Payer: Self-pay

## 2022-10-28 ENCOUNTER — Encounter (HOSPITAL_COMMUNITY): Payer: Self-pay

## 2022-10-28 DIAGNOSIS — Z89431 Acquired absence of right foot: Secondary | ICD-10-CM | POA: Diagnosis not present

## 2022-10-28 DIAGNOSIS — Z89432 Acquired absence of left foot: Secondary | ICD-10-CM | POA: Diagnosis not present

## 2022-10-28 DIAGNOSIS — E872 Acidosis, unspecified: Secondary | ICD-10-CM | POA: Diagnosis present

## 2022-10-28 DIAGNOSIS — R4182 Altered mental status, unspecified: Principal | ICD-10-CM | POA: Diagnosis present

## 2022-10-28 DIAGNOSIS — Y835 Amputation of limb(s) as the cause of abnormal reaction of the patient, or of later complication, without mention of misadventure at the time of the procedure: Secondary | ICD-10-CM | POA: Diagnosis present

## 2022-10-28 DIAGNOSIS — Z79899 Other long term (current) drug therapy: Secondary | ICD-10-CM | POA: Diagnosis not present

## 2022-10-28 DIAGNOSIS — Y92009 Unspecified place in unspecified non-institutional (private) residence as the place of occurrence of the external cause: Secondary | ICD-10-CM

## 2022-10-28 DIAGNOSIS — T8781 Dehiscence of amputation stump: Secondary | ICD-10-CM | POA: Diagnosis present

## 2022-10-28 DIAGNOSIS — Z89422 Acquired absence of other left toe(s): Secondary | ICD-10-CM | POA: Diagnosis not present

## 2022-10-28 DIAGNOSIS — Z6823 Body mass index (BMI) 23.0-23.9, adult: Secondary | ICD-10-CM | POA: Diagnosis not present

## 2022-10-28 DIAGNOSIS — D509 Iron deficiency anemia, unspecified: Secondary | ICD-10-CM | POA: Diagnosis not present

## 2022-10-28 DIAGNOSIS — Z66 Do not resuscitate: Secondary | ICD-10-CM | POA: Diagnosis not present

## 2022-10-28 DIAGNOSIS — K760 Fatty (change of) liver, not elsewhere classified: Secondary | ICD-10-CM | POA: Diagnosis present

## 2022-10-28 DIAGNOSIS — D539 Nutritional anemia, unspecified: Secondary | ICD-10-CM | POA: Diagnosis not present

## 2022-10-28 DIAGNOSIS — L723 Sebaceous cyst: Secondary | ICD-10-CM | POA: Diagnosis not present

## 2022-10-28 DIAGNOSIS — Z7901 Long term (current) use of anticoagulants: Secondary | ICD-10-CM | POA: Diagnosis not present

## 2022-10-28 DIAGNOSIS — K838 Other specified diseases of biliary tract: Secondary | ICD-10-CM | POA: Diagnosis not present

## 2022-10-28 DIAGNOSIS — C61 Malignant neoplasm of prostate: Secondary | ICD-10-CM | POA: Diagnosis present

## 2022-10-28 DIAGNOSIS — Z96653 Presence of artificial knee joint, bilateral: Secondary | ICD-10-CM | POA: Diagnosis not present

## 2022-10-28 DIAGNOSIS — R590 Localized enlarged lymph nodes: Secondary | ICD-10-CM | POA: Diagnosis present

## 2022-10-28 DIAGNOSIS — I6523 Occlusion and stenosis of bilateral carotid arteries: Secondary | ICD-10-CM | POA: Diagnosis not present

## 2022-10-28 DIAGNOSIS — K828 Other specified diseases of gallbladder: Secondary | ICD-10-CM | POA: Diagnosis not present

## 2022-10-28 DIAGNOSIS — E46 Unspecified protein-calorie malnutrition: Secondary | ICD-10-CM | POA: Diagnosis not present

## 2022-10-28 DIAGNOSIS — I428 Other cardiomyopathies: Secondary | ICD-10-CM | POA: Diagnosis present

## 2022-10-28 DIAGNOSIS — E876 Hypokalemia: Secondary | ICD-10-CM | POA: Insufficient documentation

## 2022-10-28 DIAGNOSIS — A419 Sepsis, unspecified organism: Secondary | ICD-10-CM | POA: Diagnosis not present

## 2022-10-28 DIAGNOSIS — I517 Cardiomegaly: Secondary | ICD-10-CM | POA: Diagnosis not present

## 2022-10-28 DIAGNOSIS — R000 Tachycardia, unspecified: Secondary | ICD-10-CM | POA: Diagnosis not present

## 2022-10-28 DIAGNOSIS — M545 Low back pain, unspecified: Secondary | ICD-10-CM | POA: Diagnosis not present

## 2022-10-28 DIAGNOSIS — Z8546 Personal history of malignant neoplasm of prostate: Secondary | ICD-10-CM

## 2022-10-28 DIAGNOSIS — I10 Essential (primary) hypertension: Secondary | ICD-10-CM | POA: Diagnosis not present

## 2022-10-28 DIAGNOSIS — K449 Diaphragmatic hernia without obstruction or gangrene: Secondary | ICD-10-CM | POA: Diagnosis not present

## 2022-10-28 DIAGNOSIS — M069 Rheumatoid arthritis, unspecified: Secondary | ICD-10-CM | POA: Diagnosis present

## 2022-10-28 DIAGNOSIS — D649 Anemia, unspecified: Secondary | ICD-10-CM | POA: Insufficient documentation

## 2022-10-28 DIAGNOSIS — I4892 Unspecified atrial flutter: Secondary | ICD-10-CM | POA: Diagnosis present

## 2022-10-28 DIAGNOSIS — T40605A Adverse effect of unspecified narcotics, initial encounter: Secondary | ICD-10-CM | POA: Diagnosis present

## 2022-10-28 DIAGNOSIS — R410 Disorientation, unspecified: Secondary | ICD-10-CM | POA: Diagnosis not present

## 2022-10-28 DIAGNOSIS — L03116 Cellulitis of left lower limb: Secondary | ICD-10-CM | POA: Diagnosis not present

## 2022-10-28 DIAGNOSIS — L089 Local infection of the skin and subcutaneous tissue, unspecified: Secondary | ICD-10-CM | POA: Diagnosis not present

## 2022-10-28 HISTORY — DX: Altered mental status, unspecified: R41.82

## 2022-10-28 LAB — I-STAT CHEM 8, ED
BUN: 15 mg/dL (ref 8–23)
Calcium, Ion: 0.97 mmol/L — ABNORMAL LOW (ref 1.15–1.40)
Chloride: 100 mmol/L (ref 98–111)
Creatinine, Ser: 0.9 mg/dL (ref 0.61–1.24)
Glucose, Bld: 95 mg/dL (ref 70–99)
HCT: 28 % — ABNORMAL LOW (ref 39.0–52.0)
Hemoglobin: 9.5 g/dL — ABNORMAL LOW (ref 13.0–17.0)
Potassium: 3.6 mmol/L (ref 3.5–5.1)
Sodium: 134 mmol/L — ABNORMAL LOW (ref 135–145)
TCO2: 25 mmol/L (ref 22–32)

## 2022-10-28 LAB — COMPREHENSIVE METABOLIC PANEL
ALT: 18 U/L (ref 0–44)
AST: 38 U/L (ref 15–41)
Albumin: 3.3 g/dL — ABNORMAL LOW (ref 3.5–5.0)
Alkaline Phosphatase: 79 U/L (ref 38–126)
Anion gap: 14 (ref 5–15)
BUN: 15 mg/dL (ref 8–23)
CO2: 21 mmol/L — ABNORMAL LOW (ref 22–32)
Calcium: 8.6 mg/dL — ABNORMAL LOW (ref 8.9–10.3)
Chloride: 98 mmol/L (ref 98–111)
Creatinine, Ser: 1.03 mg/dL (ref 0.61–1.24)
GFR, Estimated: >60 mL/min (ref >60)
Glucose, Bld: 94 mg/dL (ref 70–99)
Potassium: 3.9 mmol/L (ref 3.5–5.1)
Sodium: 133 mmol/L — ABNORMAL LOW (ref 135–145)
Total Bilirubin: 1.2 mg/dL (ref 0.3–1.2)
Total Protein: 6.5 g/dL (ref 6.5–8.1)

## 2022-10-28 LAB — RAPID URINE DRUG SCREEN, HOSP PERFORMED
Amphetamines: NOT DETECTED
Barbiturates: NOT DETECTED
Benzodiazepines: POSITIVE — AB
Cocaine: NOT DETECTED
Opiates: POSITIVE — AB
Tetrahydrocannabinol: NOT DETECTED

## 2022-10-28 LAB — CBC WITH DIFFERENTIAL/PLATELET
Abs Immature Granulocytes: 0.04 10*3/uL (ref 0.00–0.07)
Basophils Absolute: 0 10*3/uL (ref 0.0–0.1)
Basophils Relative: 0 %
Eosinophils Absolute: 0 10*3/uL (ref 0.0–0.5)
Eosinophils Relative: 0 %
HCT: 28.1 % — ABNORMAL LOW (ref 39.0–52.0)
Hemoglobin: 8.6 g/dL — ABNORMAL LOW (ref 13.0–17.0)
Immature Granulocytes: 1 %
Lymphocytes Relative: 15 %
Lymphs Abs: 1.1 10*3/uL (ref 0.7–4.0)
MCH: 23.6 pg — ABNORMAL LOW (ref 26.0–34.0)
MCHC: 30.6 g/dL (ref 30.0–36.0)
MCV: 77.2 fL — ABNORMAL LOW (ref 80.0–100.0)
Monocytes Absolute: 1.1 10*3/uL — ABNORMAL HIGH (ref 0.1–1.0)
Monocytes Relative: 15 %
Neutro Abs: 5.1 10*3/uL (ref 1.7–7.7)
Neutrophils Relative %: 69 %
Platelets: 223 10*3/uL (ref 150–400)
RBC: 3.64 MIL/uL — ABNORMAL LOW (ref 4.22–5.81)
RDW: 15.5 % (ref 11.5–15.5)
WBC: 7.4 10*3/uL (ref 4.0–10.5)
nRBC: 0 % (ref 0.0–0.2)

## 2022-10-28 LAB — URINALYSIS, W/ REFLEX TO CULTURE (INFECTION SUSPECTED)
Bacteria, UA: NONE SEEN
Bilirubin Urine: NEGATIVE
Glucose, UA: NEGATIVE mg/dL
Hgb urine dipstick: NEGATIVE
Ketones, ur: NEGATIVE mg/dL
Leukocytes,Ua: NEGATIVE
Nitrite: NEGATIVE
Protein, ur: 30 mg/dL — AB
Specific Gravity, Urine: 1.019 (ref 1.005–1.030)
pH: 8 (ref 5.0–8.0)

## 2022-10-28 LAB — CBG MONITORING, ED: Glucose-Capillary: 104 mg/dL — ABNORMAL HIGH (ref 70–99)

## 2022-10-28 LAB — LACTIC ACID, PLASMA
Lactic Acid, Venous: 2.6 mmol/L (ref 0.5–1.9)
Lactic Acid, Venous: 2.8 mmol/L (ref 0.5–1.9)

## 2022-10-28 LAB — ETHANOL: Alcohol, Ethyl (B): <10 mg/dL (ref <10)

## 2022-10-28 LAB — LIPASE, BLOOD: Lipase: 22 U/L (ref 11–51)

## 2022-10-28 MED ORDER — TAMSULOSIN HCL 0.4 MG PO CAPS: 0.4000 mg | ORAL_CAPSULE | Freq: Every day | ORAL | Status: DC | PRN

## 2022-10-28 MED ORDER — VANCOMYCIN HCL 2000 MG/400ML IV SOLN
2000.0000 mg | Freq: Once | INTRAVENOUS | Status: AC
  Administered 2022-10-28: 2000 mg via INTRAVENOUS
  Filled 2022-10-28: qty 400

## 2022-10-28 MED ORDER — OXYCODONE HCL 5 MG PO TABS
30.0000 mg | ORAL_TABLET | ORAL | Status: DC | PRN
  Administered 2022-10-29 – 2022-10-30 (×6): 30 mg via ORAL
  Filled 2022-10-28 (×6): qty 6

## 2022-10-28 MED ORDER — POLYETHYLENE GLYCOL 3350 17 G PO PACK
17.0000 g | PACK | Freq: Every day | ORAL | Status: DC
  Filled 2022-10-28 (×3): qty 1

## 2022-10-28 MED ORDER — LACTATED RINGERS IV SOLN: INTRAVENOUS | Status: DC

## 2022-10-28 MED ORDER — HYDROMORPHONE HCL 1 MG/ML IJ SOLN
1.0000 mg | Freq: Once | INTRAMUSCULAR | Status: AC
  Administered 2022-10-28: 1 mg via INTRAVENOUS
  Filled 2022-10-28: qty 1

## 2022-10-28 MED ORDER — FENTANYL CITRATE PF 50 MCG/ML IJ SOSY: 50.0000 ug | PREFILLED_SYRINGE | Freq: Once | INTRAMUSCULAR | Status: DC

## 2022-10-28 MED ORDER — VANCOMYCIN HCL IN DEXTROSE 1-5 GM/200ML-% IV SOLN: 1000.0000 mg | Freq: Once | INTRAVENOUS | Status: DC

## 2022-10-28 MED ORDER — IOHEXOL 350 MG/ML SOLN
75.0000 mL | Freq: Once | INTRAVENOUS | Status: AC | PRN
  Administered 2022-10-28: 75 mL via INTRAVENOUS

## 2022-10-28 MED ORDER — ENOXAPARIN SODIUM 40 MG/0.4ML IJ SOSY
40.0000 mg | PREFILLED_SYRINGE | INTRAMUSCULAR | Status: DC
  Filled 2022-10-28: qty 0.4

## 2022-10-28 MED ORDER — ACETAMINOPHEN 325 MG PO TABS: 650.0000 mg | ORAL_TABLET | Freq: Once | ORAL | Status: DC

## 2022-10-28 MED ORDER — VANCOMYCIN HCL 1500 MG/300ML IV SOLN
1500.0000 mg | Freq: Two times a day (BID) | INTRAVENOUS | Status: DC
  Administered 2022-10-29 – 2022-10-31 (×5): 1500 mg via INTRAVENOUS
  Filled 2022-10-28 (×6): qty 300

## 2022-10-28 MED ORDER — METOPROLOL SUCCINATE ER 25 MG PO TB24
12.5000 mg | ORAL_TABLET | Freq: Every day | ORAL | Status: DC
  Administered 2022-10-29 – 2022-10-31 (×3): 12.5 mg via ORAL
  Filled 2022-10-28 (×3): qty 1

## 2022-10-28 MED ORDER — ACETAMINOPHEN 325 MG PO TABS
650.0000 mg | ORAL_TABLET | Freq: Four times a day (QID) | ORAL | Status: DC
  Administered 2022-10-28 – 2022-10-31 (×10): 650 mg via ORAL
  Filled 2022-10-28 (×10): qty 2

## 2022-10-28 MED ORDER — AMIODARONE HCL 200 MG PO TABS
100.0000 mg | ORAL_TABLET | Freq: Every day | ORAL | Status: DC
  Administered 2022-10-29 – 2022-10-31 (×3): 100 mg via ORAL
  Filled 2022-10-28 (×3): qty 1

## 2022-10-28 MED ORDER — SODIUM CHLORIDE 0.9 % IV BOLUS
1000.0000 mL | Freq: Once | INTRAVENOUS | Status: AC
  Administered 2022-10-28: 1000 mL via INTRAVENOUS

## 2022-10-28 MED ORDER — SODIUM CHLORIDE 0.9 % IV SOLN
2.0000 g | Freq: Once | INTRAVENOUS | Status: AC
  Administered 2022-10-28: 2 g via INTRAVENOUS
  Filled 2022-10-28: qty 20

## 2022-10-28 MED ORDER — HALOPERIDOL LACTATE 5 MG/ML IJ SOLN
2.0000 mg | Freq: Once | INTRAMUSCULAR | Status: AC
  Administered 2022-10-28: 2 mg via INTRAVENOUS
  Filled 2022-10-28: qty 1

## 2022-10-28 MED ORDER — HALOPERIDOL LACTATE 5 MG/ML IJ SOLN: 5.0000 mg | Freq: Once | INTRAMUSCULAR | Status: DC

## 2022-10-28 NOTE — Assessment & Plan Note (Addendum)
Hemoglobin 8 6, baseline 11-12.  This may be related to dehiscence although cannot rule out GI bleed given he has never had colonoscopy and has had positive Hemoccult in the past. -AM CBC -Hold Eliquis

## 2022-10-28 NOTE — Assessment & Plan Note (Addendum)
He had revision of left transmetatarsal amputation on 09/15/2022.  Recently seen on 6/25 by Dr. Lajoyce Corners.  He was prescribed Bactrim DS at home. -Orthopedic surgery to see in a.m., appreciate recommendations -Oxycodone IR 30 mg every 4 hours as needed -Hold Bactrim DS -Vancomycin per pharmacy

## 2022-10-28 NOTE — Progress Notes (Signed)
Pharmacy Antibiotic Note  Thomas [name] is a 73 y.o. male for which pharmacy has been consulted for vancomycin dosing for cellulitis.  SCr 0.9 WBC 7.4; LA 2.6; T 98.4; HR 91; RR 18  Plan: Ceftriaxone per MD Vancomycin 2000 mg once then 1500 mg q12hr (eAUC 475.9) unless change in renal function Monitor WBC, fever, renal function, cultures De-escalate when able Levels at steady state     Temp (24hrs), Avg:98.4 F (36.9 C), Min:98.4 F (36.9 C), Max:98.4 F (36.9 C)  Recent Labs  Lab 10/28/22 1817 10/28/22 1907 10/28/22 1929  WBC 7.4  --   --   CREATININE 1.03  --  0.90  LATICACIDVEN  --  2.6*  --     CrCl cannot be calculated (Unknown ideal weight.).    Allergies  Allergen Reactions   Cefadroxil Hives and Palpitations    EMS to ED from UC.   Diltiazem Hcl Swelling   Chlorthalidone Other (See Comments)    "Makes me light-headed and I don't like the way it makes me feel"   Voltaren [Diclofenac] Rash   Microbiology results: Pending  Thank you for allowing pharmacy to be a part of this patient's care.  Delmar Landau, PharmD, BCPS 10/28/2022 7:55 PM ED Clinical Pharmacist -  [phone]

## 2022-10-28 NOTE — Assessment & Plan Note (Addendum)
Enlarged left inguinal lymph node at 16 mm.  He is s/p seed implant from 2019. -Consider follow-up outpatient

## 2022-10-28 NOTE — ED Provider Notes (Addendum)
Thomas [name] Provider Note   CSN: [id number] Arrival date & time: 10/28/22  1744     History  Chief Complaint  Patient presents with   AMS    Thomas [name] is a 72 y.o. male.  HPI 72 year old male presents with altered mental status.  History is primarily from the wife over the phone.  The patient tells me that he is having some back pain and otherwise the history is limited due to the back pain but also he seems to be confused.  When I talked to the wife it seems like he was significantly altered today.  She went to work and when she got home at 3 PM he was doing odd things like trying to take the bandage off his left foot and take the stitches out of his left foot.  He was also trying to put a T-shirt on through his legs.  He was getting agitated when she was asked him to take it off as he was then try to take off the shirt on his chest.  He ultimately was altered starting yesterday.  She does not know of any obvious falls.  The back pain is a new thing.  He has had transmetatarsal amputations of both feet, though the left one has been complicated.  Last surgery was around 5 or 6 weeks ago.  No known fevers.  She states has had episodes like this in the past where she suspects is from combining medications such as Valium and his oxycodone.  She also suspects he has been getting hydrocodone off the street.  She denies any other illicit drug use and states that he drinks about 1 beer per day.  Home Medications Prior to Admission medications   Medication Sig Start Date End Date Taking? Authorizing Provider  amiodarone (PACERONE) 100 MG tablet Take 1 tablet (100 mg total) by mouth daily. 03/31/22  Yes Meriam Sprague, MD  apixaban (ELIQUIS) 5 MG TABS tablet Take 1 tablet (5 mg total) by mouth 2 (two) times daily. 05/18/22  Yes Meriam Sprague, MD  diazepam (VALIUM) 5 MG tablet 5 mg at bedtime as needed for muscle spasms (To relax  prostate).   Yes [provider]  DULoxetine (CYMBALTA) 60 MG capsule TAKE 1 CAPSULE BY MOUTH EVERY DAY - Patient taking differently: Take 60 mg by mouth daily. 01/20/22  Yes Chambliss, Estill Batten, MD  lisinopril (ZESTRIL) 10 MG tablet Take 1 tablet (10 mg total) by mouth daily. Patient taking differently: Take 20 mg by mouth every evening. 09/26/21  Yes Meriam Sprague, MD  metoprolol succinate (TOPROL XL) 25 MG 24 hr tablet Take 1 tablet (25 mg total) by mouth daily. Patient taking differently: Take 12.5 mg by mouth daily. 07/10/22  Yes Meriam Sprague, MD  oxycodone (ROXICODONE) 30 MG immediate release tablet Take 1 tablet (30 mg total) by mouth every 4 (four) hours as needed for pain. Patient taking differently: Take 30 mg by mouth every 4 (four) hours as needed for pain (betweeen the 20mg  tablets). 09/11/22  Yes Nadara Mustard, MD  Oxycodone HCl 20 MG TABS Take 20 mg by mouth in the morning, at noon, and at bedtime. 10/27/22  Yes [provider]  silver sulfADIAZINE (SILVADENE) 1 % cream APPLY 1 APPLICATION TOPICALLY DAILY 09/14/22  Yes Nadara Mustard, MD  sulfamethoxazole-trimethoprim (BACTRIM DS) 800-160 MG tablet Take 1 tablet by mouth 2 (two) times daily. 09/20/22  Yes Lajoyce Corners,  Randa Evens, MD  tamsulosin (FLOMAX) 0.4 MG CAPS capsule Take 0.4 mg by mouth daily as needed (prostate spasms).   Yes [provider]  testosterone cypionate (DEPOTESTOSTERONE CYPIONATE) 200 MG/ML injection Inject 100 mg into the muscle once a week. 09/08/20  Yes [provider]  torsemide (DEMADEX) 20 MG tablet Take 20 mg by mouth daily.   Yes [provider]      Allergies    Cefadroxil, Diltiazem hcl, Chlorthalidone, and Voltaren [diclofenac]    Review of Systems   Review of Systems  Constitutional:  Negative for fever.  Gastrointestinal:  Negative for abdominal pain.  Musculoskeletal:  Positive for back pain.  Psychiatric/Behavioral:  Positive for confusion.      Physical Exam Updated Vital Signs BP (!) 145/71 (BP Location: Right Arm)   Pulse 97   Temp (!) 100.5 F (38.1 C) (Oral)   Resp 20   SpO2 97%  Physical Exam Vitals and nursing note reviewed.  Constitutional:      Appearance: He is well-developed.  HENT:     Head: Normocephalic and atraumatic.  Cardiovascular:     Rate and Rhythm: Normal rate and regular rhythm.     Heart sounds: Normal heart sounds.  Pulmonary:     Effort: Pulmonary effort is normal.     Breath sounds: Normal breath sounds.  Abdominal:     Palpations: Abdomen is soft.     Tenderness: There is no abdominal tenderness.  Musculoskeletal:     Lumbar back: Tenderness (diffuse, involving midline and bilateral back near CVA) present.       Back:     Comments: See picture of left foot. It is swollen and erythematous.  Skin:    General: Skin is warm and dry.     Findings: Erythema present.  Neurological:     Mental Status: He is alert.     Comments: Patient is alert and oriented to person, place, and month. He repeats July when asked what year it is a couple different times.  He has no slurred speech or facial droop. He has 5/5 strength in all 4 extremities.     ED Results / Procedures / Treatments   Labs (all labs ordered are listed, but only abnormal results are displayed) Labs Reviewed  URINALYSIS, W/ REFLEX TO CULTURE (INFECTION SUSPECTED) - Abnormal; Notable for the following components:      Result Value   Protein, ur 30 (*)    All other components within normal limits  COMPREHENSIVE METABOLIC PANEL - Abnormal; Notable for the following components:   Sodium 133 (*)    CO2 21 (*)    Calcium 8.6 (*)    Albumin 3.3 (*)    All other components within normal limits  RAPID URINE DRUG SCREEN, HOSP PERFORMED - Abnormal; Notable for the following components:   Opiates POSITIVE (*)    Benzodiazepines POSITIVE (*)    All other components within normal limits  CBC WITH DIFFERENTIAL/PLATELET - Abnormal;  Notable for the following components:   RBC 3.64 (*)    Hemoglobin 8.6 (*)    HCT 28.1 (*)    MCV 77.2 (*)    MCH 23.6 (*)    Monocytes Absolute 1.1 (*)    All other components within normal limits  LACTIC ACID, PLASMA - Abnormal; Notable for the following components:   Lactic Acid, Venous 2.6 (*)    All other components within normal limits  LACTIC ACID, PLASMA - Abnormal; Notable for the following components:  Lactic Acid, Venous 2.8 (*)    All other components within normal limits  CBG MONITORING, ED - Abnormal; Notable for the following components:   Glucose-Capillary 104 (*)    All other components within normal limits  I-STAT CHEM 8, ED - Abnormal; Notable for the following components:   Sodium 134 (*)    Calcium, Ion 0.97 (*)    Hemoglobin 9.5 (*)    HCT 28.0 (*)    All other components within normal limits  CULTURE, BLOOD (ROUTINE X 2)  CULTURE, BLOOD (ROUTINE X 2)  ETHANOL  LIPASE, BLOOD  HIV ANTIBODY (ROUTINE TESTING W REFLEX)  CBC  BASIC METABOLIC PANEL  LACTIC ACID, PLASMA  LACTIC ACID, PLASMA    EKG EKG Interpretation Date/Time:  Saturday October 28 2022 17:53:36 EDT Ventricular Rate:  92 PR Interval:  189 QRS Duration:  96 QT Interval:  359 QTC Calculation: 445 R Axis:   -38  Text Interpretation: Sinus or ectopic atrial rhythm Probable left atrial enlargement Left axis deviation  no acute ST/T changes Confirmed by Pricilla Loveless [phone]) on 10/28/2022 6:07:16 PM  Radiology CT ABDOMEN PELVIS W CONTRAST  Result Date: 10/28/2022 CLINICAL DATA:  Provided history of sepsis. EXAM: CT ABDOMEN AND PELVIS WITH CONTRAST TECHNIQUE: Multidetector CT imaging of the abdomen and pelvis was performed using the standard protocol following bolus administration of intravenous contrast. RADIATION DOSE REDUCTION: This exam was performed according to the departmental dose-optimization program which includes automated exposure control, adjustment of the mA and/or kV according to  patient size and/or use of iterative reconstruction technique. CONTRAST:  75mL OMNIPAQUE IOHEXOL 350 MG/ML SOLN COMPARISON:  None Available. FINDINGS: Lower chest: No pleural effusion or acute basilar airspace disease. Moderate-sized hiatal hernia. Hepatobiliary: No focal liver abnormality. Mild gallbladder distension. Possible small calcified gallstone. No pericholecystic inflammation. Mild common bile duct dilatation at 9 mm. No visualized choledocholithiasis. Pancreas: No ductal dilatation or inflammation. Spleen: Splenomegaly, 14 cm cranial caudal. No focal splenic abnormality. Adrenals/Urinary Tract: Normal adrenal glands. No hydronephrosis or perinephric edema. Homogeneous renal enhancement with symmetric excretion on delayed phase imaging. Urinary bladder is partially distended, streak artifact from right hip surgical hardware obscures detailed assessment. Stomach/Bowel: Moderate-sized hiatal hernia. There is no bowel obstruction or inflammation. Moderate volume of colonic stool. Normal appendix visualized. Mild sigmoid colonic diverticulosis without diverticulitis. Vascular/Lymphatic: Aortic atherosclerosis and tortuosity. No aneurysm. Patent portal vein. No acute vascular findings. There is a 16 mm left inguinal node, series 3, image 90. Reproductive: Brachytherapy seeds in the prostate. Other: No free air, free fluid, or intra-abdominal fluid collection. Musculoskeletal: Scoliosis with diffuse lumbar degenerative change. Surgical hardware in the right proximal femur. No acute osseous findings. IMPRESSION: 1. Mild gallbladder distension with possible small calcified gallstone. No pericholecystic inflammation. Mild common bile duct dilatation at 9 mm. Recommend right upper quadrant ultrasound for further evaluation. 2. Mild sigmoid colonic diverticulosis without diverticulitis. 3. Moderate-sized hiatal hernia. 4. Splenomegaly. 5. Enlarged left inguinal node measuring 16 mm, nonspecific. Given history of  prostate cancer, recommend correlation with PSA. Aortic Atherosclerosis (ICD10-I70.0). Electronically Signed   By: Narda Rutherford M.D.   On: 10/28/2022 20:54   CT Head Wo Contrast  Result Date: 10/28/2022 CLINICAL DATA:  Mental status change. EXAM: CT HEAD WITHOUT CONTRAST TECHNIQUE: Contiguous axial images were obtained from the base of the skull through the vertex without intravenous contrast. RADIATION DOSE REDUCTION: This exam was performed according to the departmental dose-optimization program which includes automated exposure control, adjustment of the mA and/or kV according  to patient size and/or use of iterative reconstruction technique. COMPARISON:  Head CT 05/04/2020 FINDINGS: Brain: No evidence of acute infarction, hemorrhage, hydrocephalus, extra-axial collection or mass lesion/mass effect. Again seen is mild diffuse atrophy. Vascular: Atherosclerotic calcifications are present within the cavernous internal carotid arteries. Skull: Normal. Negative for fracture or focal lesion. Sinuses/Orbits: No acute finding. Other: None. IMPRESSION: 1. No acute intracranial process. 2. Stable mild diffuse atrophy. Electronically Signed   By: Darliss Cheney M.D.   On: 10/28/2022 20:52   DG Foot Complete Left  Result Date: 10/28/2022 CLINICAL DATA:  Altered mental status, infection in foot at amputation site. EXAM: LEFT FOOT - COMPLETE 3+ VIEW COMPARISON:  Left foot radiographs 08/08/2022 FINDINGS: There has been interval transmetatarsal amputation of all five digits. There is irregularity of the cortex of the amputation margin of the fifth metatarsal with significant surrounding soft tissue swelling. There is also possible cortical irregularity and lucency along the anteromedial of the cortex of the medial cuneiform. There is no definite soft tissue gas. IMPRESSION: Interval transmetatarsal amputation of all five digits with cortical irregularity at the amputation margin of fifth metatarsal, and possible  lucency in the anteromedial aspect of the medial cuneiform, raising the possibility of osteomyelitis. MRI may be considered for further evaluation if indicated. Electronically Signed   By: Lesia Hausen M.D.   On: 10/28/2022 19:19   DG Chest Portable 1 View  Result Date: 10/28/2022 CLINICAL DATA:  Altered mental status EXAM: PORTABLE CHEST 1 VIEW COMPARISON:  05/22/2021 FINDINGS: Transverse diameter of heart is increased. There is lucency in retrocardiac region suggesting fixed hiatal hernia. There are no signs of alveolar pulmonary edema or focal pulmonary consolidation. There is no pleural effusion or pneumothorax. IMPRESSION: Cardiomegaly. Fixed hiatal hernia. There are no signs of pulmonary edema or focal pulmonary consolidation. Electronically Signed   By: Ernie Avena M.D.   On: 10/28/2022 19:08    Procedures .Critical Care  Performed by: Pricilla Loveless, MD Authorized by: Pricilla Loveless, MD   Critical care provider statement:    Critical care time (minutes):  40   Critical care time was exclusive of:  Separately billable procedures and treating other patients   Critical care was necessary to treat or prevent imminent or life-threatening deterioration of the following conditions:  Sepsis and CNS failure or compromise   Critical care was time spent personally by me on the following activities:  Development of treatment plan with patient or surrogate, discussions with consultants, evaluation of patient's response to treatment, examination of patient, ordering and review of laboratory studies, ordering and review of radiographic studies, ordering and performing treatments and interventions, pulse oximetry, re-evaluation of patient's condition and review of old charts     Medications Ordered in ED Medications  vancomycin (VANCOREADY) IVPB 1500 mg/300 mL (has no administration in time range)  oxyCODONE (Oxy IR/ROXICODONE) immediate release tablet 30 mg (has no administration in time  range)  tamsulosin (FLOMAX) capsule 0.4 mg (has no administration in time range)  lactated ringers infusion (has no administration in time range)  polyethylene glycol (MIRALAX / GLYCOLAX) packet 17 g (has no administration in time range)  enoxaparin (LOVENOX) injection 40 mg (has no administration in time range)  acetaminophen (TYLENOL) tablet 650 mg (has no administration in time range)  metoprolol succinate (TOPROL-XL) 24 hr tablet 12.5 mg (has no administration in time range)  amiodarone (PACERONE) tablet 100 mg (has no administration in time range)  sodium chloride 0.9 % bolus 1,000 mL (0 mLs Intravenous Stopped  10/28/22 1957)  HYDROmorphone (DILAUDID) injection 1 mg (1 mg Intravenous Given 10/28/22 1829)  HYDROmorphone (DILAUDID) injection 1 mg (1 mg Intravenous Given 10/28/22 2003)  cefTRIAXone (ROCEPHIN) 2 g in sodium chloride 0.9 % 100 mL IVPB (0 g Intravenous Stopped 10/28/22 2118)  vancomycin (VANCOREADY) IVPB 2000 mg/400 mL (2,000 mg Intravenous New Bag/Given 10/28/22 2115)  iohexol (OMNIPAQUE) 350 MG/ML injection 75 mL (75 mLs Intravenous Contrast Given 10/28/22 2026)  haloperidol lactate (HALDOL) injection 2 mg (2 mg Intravenous Given 10/28/22 2242)    ED Course/ Medical Decision Making/ A&P Clinical Course as of 10/28/22 2315  Sat Oct 28, 2022  1951 Lactate has come back over 2. While his WBC is normal, his foot appears infected, so will call code sepsis and give IV antibiotics. [SG]    Clinical Course User Index [SG] Pricilla Loveless, MD                             Medical Decision Making Amount and/or Complexity of Data Reviewed Labs: ordered.    Details: Lactate over 2 twice. Normal WBC. Radiology: ordered.    Details: No head bleed ECG/medicine tests: ordered and independent interpretation performed.    Details: No acute ST/T changes  Risk Prescription drug management. Decision regarding hospitalization.   Patient presents with altered mental status.  He is generally  confused and seems to sometimes answer inappropriately.  Initially afebrile though as above his lactate did return elevated and was started on sepsis protocol given his left foot appears to be infected.  Later he did also develop a fever.  No meningismus and I have lower suspicion for acute CNS infection.  I do think his foot is infected and so he was given vancomycin and ceftriaxone for this.  If he does need LP, will need to be deferred at this time due to his anticoagulation. He was given some fluids.  At 1 point, after his wife had left he pulled out his IV and was trying to get out of bed and somehow was naked.  He had to have another IV placed and due to his general confusion and interference with medical treatment he was given a small dose of Haldol.  He will need to be admitted and I discussed with the family practice service.  Also discussed with Dr. Roda Shutters of orthopedics, who will let Dr. Lajoyce Corners know to help consult and treat his foot.   Of note, he doesn't have any abdominal pain, and his back pain is gone.  Right upper quadrant will be ordered though I think cholecystitis or acute gallbladder pathology is unlikely.     Final Clinical Impression(s) / ED Diagnoses Final diagnoses:  Sepsis, due to unspecified organism, unspecified whether acute organ dysfunction present [facility])    Rx / DC Orders ED Discharge Orders     None         Pricilla Loveless, MD 10/28/22 2317    Pricilla Loveless, MD 10/28/22 2318

## 2022-10-28 NOTE — Progress Notes (Signed)
Pt being followed by [facility] for Sepsis protocol. 

## 2022-10-28 NOTE — Assessment & Plan Note (Addendum)
New onset, consistent for the last 24 to 48 hours.  It is an odd picture as he is alert however oriented to only self and others.  His timeline of events seem to be altered and he does have word finding difficulty.  He does not have a focal deficit although is unable to follow specific commands, however he is pleasant and agreeable.  CT head was unremarkable.  His vitals are stable and he does not appear infectious.  Specialist consult may be appropriate depending on MRI brain findings.  There is further concern that he has been getting pain medications or drugs off of the street, UDS is appropriately positive for opioids and benzos. -Admit to [facility], progressive unit, attending Dr. Deirdre Priest -Follow-up blood cultures -LR at 100 mL/h, s/p 1L NS in ED -Neurochecks every 4 hours -Delirium precautions -Vital signs per unit -Consider brain MR and corresponding neurology or psychiatry consult - f/u RUQ U/S

## 2022-10-28 NOTE — ED Triage Notes (Signed)
Pt BIB [facility] from home d/t AMS the last 2 days. Pt is usually A/Ox4 but his wife reported to EMS that he will intentionally take more pain meds (oxy) & mix it with other meds (such as benzos) & has presented this way before when he would keep doing that. Upon arrival to ED pt presents very restless, agitated & unsteady on his feet. Was tachy, so he was given 500 cc NS via 18g Lt FA PIV & is now 98 bpm, 160/80, 98% RA & CBG 153.

## 2022-10-28 NOTE — H&P (Cosign Needed Addendum)
Hospital Admission History and Physical Service Pager: [phone]  Patient name: Thomas [name] Medical record number: [id number] Date of Birth: 1951/03/06 Age: 72 y.o. Gender: male  Primary Care Provider: Carney Living, MD Consultants: Ortho Code Status: DNR which was confirmed with patient and wife Preferred Emergency Contact:  Contact Information     Name Relation Home Work Mobile   Quam,Robin Spouse   [phone]      Chief Complaint: altered mental status  Assessment and Plan: Thomas [name] is a 72 y.o. male presenting with altered mental status. Differential for presentation of this includes sepsis/infectious etiology, new onset dementia (potentially vascular dementia), drug use, delirium of unknown origin.  At this time, drug use and infectious etiology are possible although would likely improve with time and antibiotic treatment.  Given his bizarre behavior and aggressive tendencies at home recently, I am highly considering newly diagnosed dementia in particular frontotemporal origin.   Hospital Problem List      Hospital     * (Principal) Altered mental status     New onset, consistent for the last 24 to 48 hours.  It is an odd  picture as he is alert however oriented to only self and others.  His  timeline of events seem to be altered and he does have word finding  difficulty.  He does not have a focal deficit although is unable to follow  specific commands, however he is pleasant and agreeable.  CT head was  unremarkable.  His vitals are stable and he does not appear infectious.   Specialist consult may be appropriate depending on MRI brain findings.   There is further concern that he has been getting pain medications or  drugs off of the street, UDS is appropriately positive for opioids and  benzos. -Admit to [facility] TS, progressive unit, attending Dr. Deirdre Priest -Follow-up blood cultures -LR at 100 mL/h, s/p 1L NS in ED -Neurochecks every 4  hours -Delirium precautions -Vital signs per unit -Consider brain MR and corresponding neurology or psychiatry consult - f/u RUQ U/S        Malignant neoplasm of prostate (HCC)     Enlarged left inguinal lymph node at 16 mm.  He is s/p seed implant  from 2019. -Consider follow-up outpatient        Dehiscence of amputation stump of left lower extremity (HCC)     He had revision of left transmetatarsal amputation on 09/15/2022.   Recently seen on 6/25 by Dr. Lajoyce Corners.  He was prescribed Bactrim DS at home. -Orthopedic surgery to see in a.m., appreciate recommendations -Oxycodone IR 30 mg every 4 hours as needed -Hold Bactrim DS -Vancomycin per pharmacy        Anemia     Hemoglobin 8 6, baseline 11-12.  This may be related to dehiscence  although cannot rule out GI bleed given he has never had colonoscopy and  has had positive Hemoccult in the past. -AM CBC -Hold Eliquis      Chronic, stable conditions: Hypertension: Normotensive, hold lisinopril Atrial flutter: Hold Eliquis and amiodarone in the meantime Rheumatoid arthritis: Stable  FEN/GI: regular VTE Prophylaxis: Lovenox to start tomorrow AM  Disposition: Progressive  History of Present Illness:  Thomas [name] is a 72 y.o. male presenting with altered mental status, dehiscence of left TMA.  Wife brought him in because she noticed that yesterday the patient had "bizarre" behavior - pt was struggling with word-finding, doing "strange things" all day, "  wasn't mentally there". For example pt tried to put on a t shirt by stepping into it rather than pulling it over his head. Pt does remember doing this but doesn't remember why. He was also messing with his bandages all day. This behavior continued into today - he hasn't bathed or eaten much. Has not been sleeping/eating well the past few days.  Has also been complaining of lower back pain. No recent falls or trauma. At home, uses a scooter to get around.  Reports normal Bms  and urination, no hematochezia, no hematuria. Has not had colonoscopy.  No reported fevers at home. Wife works but they live together.  Recently underwent amputation of L toes with Dr. Lajoyce Corners. Pt and wife are not entirely sure why. They report that the L foot wound has been bleeding/oozing more than usual this past week.  Patient's wife spoke with Dr. Barb Merino outside patient's room -- she reports that the patient has been getting hydrocodone from someone "on the street" - has been doing this for several years. Reportedly goes through 20-30 in a week. Has reportedly been taking this recently. Has also been using more Valium than usual - reportedly went through 20-30 in the last week. Wife reports that his symptoms may be due to him mixing Valium and hydrocodone.  In the ED, he received 1 L NS, Dilaudid 1 mg x 2, blood cultures x 2 Rocephin, and vancomycin was initiated.  ED provider had concern for infected postop foot given red swelling over the last week.  Further concern as he has been altered and wife is concerned it is related to narcotic or benzo use.  They have consulted Dr. Audrie Lia team who will see patient in a.m. for his foot.  Review Of Systems: Per HPI  Pertinent Past Medical History: Prostate cancer s/p seed implant 2019 Atrial flutter Anemia HTN Nonischemic cardiomyopathy Rheumatoid arthritis Remainder reviewed in history tab.   Pertinent Past Surgical History: R and L transmetatarsal amputation B/l total knee arthroplasty R/L heart cath Inguinal hernia repair Remainder reviewed in history tab.   Pertinent Social History: Tobacco use: No Alcohol use: about 1 beer every night Other Substance use: Pt denies, but wife reports excessive hydrocodone and valium use Lives with Wife, 26yo son.  Pertinent Family History: Brother and father with hx of prostate cancer, deceased  Remainder reviewed in history tab.   Important Outpatient  Medications: Amiodarone Eliquis Valium Cymbalta Lisinopril Toprol-XL Oxycodone Bactrim Flomax Torsemide - reports taking as needed Remainder reviewed in medication history.   Objective: BP (!) 145/71 (BP Location: Right Arm)   Pulse 97   Temp (!) 100.5 F (38.1 C) (Oral)   Resp 20   SpO2 97%  Exam: General: Awake, well-appearing, conversational Eyes: Pinpoint pupils which are reactive, unable to evaluate extraocular movements as patient unable to comply with exam Neck: Normal ROM Cardiovascular: RRR, no murmurs auscultated Respiratory: CTAB, normal WOB Gastrointestinal: Soft, nontender, normoactive bowel sounds, no hepatomegaly; no CVA tenderness MSK: +1 pitting edema BLEs, bilateral TMA, dehiscence of left TMA (see photo), warm, tender left foot Derm: 2 to 3 cm sebaceous cyst on left/middle back approximately T5 Neuro: riented to self and birthdate, states he is at home, states today is Tuesday, states it is currently 2020, states Jackquline Bosch is president; 5/5 strength of BUE's, cranial nerves intact with the exception of 3/4/6 as unable to evaluate those Psych: Happy, normal affect, pleasantly confused    Labs:  CBC BMET  Recent Labs  Lab 10/28/22 1817  10/28/22 1929  WBC 7.4  --   HGB 8.6* 9.5*  HCT 28.1* 28.0*  PLT 223  --    Recent Labs  Lab 10/28/22 1817 10/28/22 1929  NA 133* 134*  K 3.9 3.6  CL 98 100  CO2 21*  --   BUN 15 15  CREATININE 1.03 0.90  GLUCOSE 94 95  CALCIUM 8.6*  --      Lactic acid: 2.6> 2.8 Ethanol: <10 UDS: Opiates and benzodiazepines positive Lipase: 22 UA: WBC and RBC 0-5  EKG: NSR  Imaging Studies Performed:  DG Chest 1 view:  IMPRESSION: Cardiomegaly. Fixed hiatal hernia. There are no signs of pulmonary edema or focal pulmonary consolidation.  DG Foot complete Left: IMPRESSION: Interval transmetatarsal amputation of all five digits with cortical irregularity at the amputation margin of fifth metatarsal, and possible  lucency in the anteromedial aspect of the medial cuneiform, raising the possibility of osteomyelitis. MRI may be considered for further evaluation if indicated.  CT Head WO Contrast: IMPRESSION: 1. No acute intracranial process. 2. Stable mild diffuse atrophy.   CT Abd/Pelvis W Contrast: IMPRESSION: 1. Mild gallbladder distension with possible small calcified gallstone. No pericholecystic inflammation. Mild common bile duct dilatation at 9 mm. Recommend right upper quadrant ultrasound for further evaluation. 2. Mild sigmoid colonic diverticulosis without diverticulitis. 3. Moderate-sized hiatal hernia. 4. Splenomegaly. 5. Enlarged left inguinal node measuring 16 mm, nonspecific. Given history of prostate cancer, recommend correlation with PSA.   Aortic Atherosclerosis (ICD10-I70.0).  Shelby Mattocks, DO 10/28/2022, 10:49 PM PGY-3, [facility]  FPTS Intern pager: [phone], text pages welcome Secure chat group [facility] Teaching Service

## 2022-10-29 ENCOUNTER — Inpatient Hospital Stay (HOSPITAL_COMMUNITY): Payer: Medicare Other

## 2022-10-29 DIAGNOSIS — K838 Other specified diseases of biliary tract: Secondary | ICD-10-CM | POA: Insufficient documentation

## 2022-10-29 DIAGNOSIS — R4182 Altered mental status, unspecified: Secondary | ICD-10-CM

## 2022-10-29 LAB — BASIC METABOLIC PANEL
Anion gap: 12 (ref 5–15)
BUN: 11 mg/dL (ref 8–23)
CO2: 24 mmol/L (ref 22–32)
Calcium: 8.3 mg/dL — ABNORMAL LOW (ref 8.9–10.3)
Chloride: 99 mmol/L (ref 98–111)
Creatinine, Ser: 0.92 mg/dL (ref 0.61–1.24)
GFR, Estimated: >60 mL/min (ref >60)
Glucose, Bld: 93 mg/dL (ref 70–99)
Potassium: 3.4 mmol/L — ABNORMAL LOW (ref 3.5–5.1)
Sodium: 135 mmol/L (ref 135–145)

## 2022-10-29 LAB — CBC
HCT: 26.2 % — ABNORMAL LOW (ref 39.0–52.0)
Hemoglobin: 8 g/dL — ABNORMAL LOW (ref 13.0–17.0)
MCH: 23.8 pg — ABNORMAL LOW (ref 26.0–34.0)
MCHC: 30.5 g/dL (ref 30.0–36.0)
MCV: 78 fL — ABNORMAL LOW (ref 80.0–100.0)
Platelets: 215 10*3/uL (ref 150–400)
RBC: 3.36 MIL/uL — ABNORMAL LOW (ref 4.22–5.81)
RDW: 15.6 % — ABNORMAL HIGH (ref 11.5–15.5)
WBC: 5.1 10*3/uL (ref 4.0–10.5)
nRBC: 0 % (ref 0.0–0.2)

## 2022-10-29 LAB — CULTURE, BLOOD (ROUTINE X 2): Culture: NO GROWTH

## 2022-10-29 LAB — FERRITIN: Ferritin: 83 ng/mL (ref 24–336)

## 2022-10-29 LAB — LACTIC ACID, PLASMA
Lactic Acid, Venous: 2.9 mmol/L (ref 0.5–1.9)
Lactic Acid, Venous: 1.8 mmol/L (ref 0.5–1.9)

## 2022-10-29 LAB — RPR: RPR Ser Ql: NONREACTIVE

## 2022-10-29 LAB — HIV ANTIBODY (ROUTINE TESTING W REFLEX): HIV Screen 4th Generation wRfx: NONREACTIVE

## 2022-10-29 LAB — TSH: TSH: 1.651 u[IU]/mL (ref 0.350–4.500)

## 2022-10-29 LAB — PSA: Prostatic Specific Antigen: 0.05 ng/mL (ref 0.00–4.00)

## 2022-10-29 MED ORDER — ENOXAPARIN SODIUM 40 MG/0.4ML IJ SOSY
40.0000 mg | PREFILLED_SYRINGE | INTRAMUSCULAR | Status: DC
  Administered 2022-10-29 – 2022-10-30 (×2): 40 mg via SUBCUTANEOUS
  Filled 2022-10-29 (×2): qty 0.4

## 2022-10-29 MED ORDER — QUETIAPINE FUMARATE 25 MG PO TABS
25.0000 mg | ORAL_TABLET | Freq: Once | ORAL | Status: AC | PRN
  Administered 2022-10-30: 25 mg via ORAL
  Filled 2022-10-29: qty 1

## 2022-10-29 MED ORDER — DIAZEPAM 5 MG PO TABS
5.0000 mg | ORAL_TABLET | Freq: Every day | ORAL | Status: DC
  Administered 2022-10-29 – 2022-10-30 (×2): 5 mg via ORAL
  Filled 2022-10-29 (×2): qty 1

## 2022-10-29 MED ORDER — LORAZEPAM 1 MG PO TABS: 1.0000 mg | ORAL_TABLET | Freq: Once | ORAL | Status: DC | PRN

## 2022-10-29 NOTE — Assessment & Plan Note (Signed)
History of. 16mm inguinal lymph node noted on admission imaging. - TSH

## 2022-10-29 NOTE — Assessment & Plan Note (Signed)
He had revision of left transmetatarsal amputation on 09/15/2022.  Recently seen on 6/25 by Dr. Lajoyce Corners.  He was prescribed Bactrim DS at home. -Awaiting ortho eval this am -Oxycodone IR 30 mg every 4 hours as needed -Vancomycin per pharmacy

## 2022-10-29 NOTE — Plan of Care (Signed)

## 2022-10-29 NOTE — Assessment & Plan Note (Signed)
Hgb 8.0 today. Hx of FOBT + but never followed up with colonoscopy. Has never had a colonoscopy.  -Holding Eliquis -When his wife gets here we can discuss whether GI eval fits within their goals of care, he lacks capacity at present

## 2022-10-29 NOTE — Progress Notes (Signed)
Orthopedic Tech Progress Note Patient Details:  [name] [birth year] [id number]  Routine order for 2 XL and 2 XXL shrinkers called into [facility].   Patient ID: Thomas [name], male   DOB: 02-May-1950, 72 y.o.   MRN: [id number]  Docia Furl 10/29/2022, 12:06 PM

## 2022-10-29 NOTE — Progress Notes (Signed)
Patient ID: Thomas [name], male   DOB: [birth year], 72 y.o.   MRN: [id number] Patient is status post recent left transmetatarsal amputation.  Patient presented with cellulitis and swelling.  Examination the cellulitis appears resolving.  The wound has dehisced but there is no depth to the wound.  Patient has a palpable dorsalis pedis pulse.  I will place an order for a stump shrinker sock to be worn on the left around-the-clock to help decrease the swelling.  This sock is to be worn directly against the wound.  Anticipate that after several more days of IV antibiotics patient can discharge on the oral antibiotics.  I will follow-up in the office in 1 week.

## 2022-10-29 NOTE — Assessment & Plan Note (Signed)
Noted on imaging during admission. His LFTs are all WNL and he is without abdominal pain. Very unlikely to be causing his symptoms at present.  - Should he develop worsening signs of infection or abdominal symptoms, would pursue MRCP, hold off for now

## 2022-10-29 NOTE — Progress Notes (Signed)
Daily Progress Note Intern Pager: [phone]  Patient name: Thomas [name] Medical record number: [id number] Date of birth: Nov 29, 1950 Age: 72 y.o. Gender: male  Primary Care Provider: Carney Living, MD Consultants: Ortho Code Status: DNR   Pt Overview and Major Events to Date:  7/7- admitted   Assessment and Plan: Thomas [name] is a 72yo male who presented with altered mental status.  The source of his AMS remains uncertain.  He remains with somewhat bizarre answers to questions this morning.  Unclear if this is related to an infection of his prior TMA versus dementia versus drug use.  Also being worked up for reversible causes of dementia. Pertinent PMH/PSH includes chronic narcotic and benzodiazepine use, history of prostate cancer, atrial flutter, nonischemic cardiomyopathy, rheumatoid arthritis.  Hospital Problem List      Hospital     * (Principal) Altered mental status     Differential remains broad.  Certainly this possibility that this is  due to an infection of his foot given that he came in with a fever.   Curiously, does not have an elevated white count.  Lactic acid was only  minimally elevated on admission.  Not clear picture.  Bizarre behavior  raises concern for frontotemporal dementia versus drug abuse.  Could also  consider paraneoplastic process given history of prostate cancer with  enlarged inguinal lymph nodes send also history of FOBT positive now with  hemoglobin, having never had a colonoscopy. -MR Arlys John pending -Follow-up blood cultures -TSH, RPR, PSA -Dr. Lajoyce Corners to see for evaluation of foot wound  -LR at 100 mL/h, s/p 1L NS in ED -Neurochecks every 4 hours -Delirium precautions        Malignant neoplasm of prostate (HCC)     History of. 16mm inguinal lymph node noted on admission imaging. - TSH        Dehiscence of amputation stump of left lower extremity (HCC)     He had revision of left transmetatarsal amputation on  09/15/2022.   Recently seen on 6/25 by Dr. Lajoyce Corners.  He was prescribed Bactrim DS at home. -Awaiting ortho eval this am -Oxycodone IR 30 mg every 4 hours as needed -Vancomycin per pharmacy        Anemia     Hgb 8.0 today. Hx of FOBT + but never followed up with colonoscopy. Has  never had a colonoscopy.  -Holding Eliquis -When his wife gets here we can discuss whether GI eval fits within their  goals of care, he lacks capacity at present         Common bile duct dilatation     Noted on imaging during admission. His LFTs are all WNL and he is  without abdominal pain. Very unlikely to be causing his symptoms at  present.  - Should he develop worsening signs of infection or abdominal symptoms,  would pursue MRCP, hold off for now       FEN/GI: Regular diet PPx: Lovenox Dispo: Pending clinical improvement, will order PT/OT eval for tomorrow  .  Objective: Temp:  [97.8 F (36.6 C)-100.5 F (38.1 C)] 99 F (37.2 C) (07/07 0803) Pulse Rate:  [71-101] 71 (07/07 0803) Resp:  [14-20] 16 (07/07 0803) BP: (86-161)/(50-107) 124/68 (07/07 0803) SpO2:  [97 %-100 %] 97 % (07/07 0803) Weight:  [93.7 kg] 93.7 kg (07/07 0434) Physical Exam: General: Pleasant but confused, not sure why he's here Cardiovascular: Regular rate and rhythm, without murmur  Respiratory: Normal WOB on RA  Abdomen: Soft, non-tender, RUQ exam unremarkable Extremities: Trace pitting edema, bilateral TMA Neuro: Grip strength grossly diminished bilaterally but difficult to tell if this is true weakness versus effort-related "I didn't get any sleep"  Laboratory: Most recent CBC Lab Results  Component Value Date   WBC 5.1 10/29/2022   HGB 8.0 (L) 10/29/2022   HCT 26.2 (L) 10/29/2022   MCV 78.0 (L) 10/29/2022   PLT 215 10/29/2022   Most recent BMP    Latest Ref Rng & Units 10/29/2022    1:46 AM  BMP  Glucose 70 - 99 mg/dL 93   BUN 8 - 23 mg/dL 11   Creatinine 1.61 - 1.24 mg/dL 0.96   Sodium 045 - 409 mmol/L  135   Potassium 3.5 - 5.1 mmol/L 3.4   Chloride 98 - 111 mmol/L 99   CO2 22 - 32 mmol/L 24   Calcium 8.9 - 10.3 mg/dL 8.3     Imaging/Diagnostic Tests: RUQ Ultrasound IMPRESSION: 1. Hepatic steatosis without focal liver lesions. 2. Dilated common bile duct without evidence of an obstructing lesion. Further evaluation with MRCP is recommended.  Independently reviewed and agree with radiologist's interpretation. 10.82mm CBD.  Alicia Amel, MD 10/29/2022, 8:51 AM  PGY-3, [facility] FPTS Intern pager: [phone], text pages welcome Secure chat group [facility] Teaching Service

## 2022-10-29 NOTE — Progress Notes (Signed)
Patient arrives on unit accompanied by ED RN and NT. Lethargic but able to follow commands. POC and unit orientation explained with understanding verbalized. VSS, c/o pain addressd and no distress noted. Physical assessment complete; see flowsheet for details. Bed locked and lowered, Call bell and necessities within reach, [facility]

## 2022-10-29 NOTE — ED Notes (Signed)
ED TO INPATIENT HANDOFF REPORT  ED Nurse Name and Phone #: Lanora Manis [phone]  S Name/Age/Gender Thomas [name] 72 y.o. male Room/Bed: 028C/028C  Code Status   Code Status: DNR  Home/SNF/Other Home Patient oriented to: self Is this baseline? No   Triage Complete: Triage complete  Chief Complaint Altered mental status [R41.82]  Triage Note Pt BIB [facility] from home d/t AMS the last 2 days. Pt is usually A/Ox4 but his wife reported to EMS that he will intentionally take more pain meds (oxy) & mix it with other meds (such as benzos) & has presented this way before when he would keep doing that. Upon arrival to ED pt presents very restless, agitated & unsteady on his feet. Was tachy, so he was given 500 cc NS via 18g Lt FA PIV & is now 98 bpm, 160/80, 98% RA & CBG 153.   Allergies Allergies  Allergen Reactions   Cefadroxil Hives and Palpitations    EMS to ED from UC.   Diltiazem Hcl Swelling   Chlorthalidone Other (See Comments)    "Makes me light-headed and I don't like the way it makes me feel"   Voltaren [Diclofenac] Rash    Level of Care/Admitting Diagnosis ED Disposition     ED Disposition  Admit   Condition  --   Comment  Hospital Area: [facility] [[id number]]  Level of Care: Progressive [102]  Admit to Progressive based on following criteria: MULTISYSTEM THREATS such as stable sepsis, metabolic/electrolyte imbalance with or without encephalopathy that is responding to early treatment.  May admit patient to [facility] or [facility] if equivalent level of care is available:: No  Covid Evaluation: Asymptomatic - no recent exposure (last 10 days) testing not required  Diagnosis: Altered mental status [780.97.ICD-9-CM]  Admitting Physician: Shelby Mattocks [[id number]]  Attending Physician: Carney Living [phone]  Certification:: I certify this patient will need inpatient services for at least 2 midnights  Estimated Length of Stay: 3           B Medical/Surgery History Past Medical History:  Diagnosis Date   Anemia    Atrial flutter (HCC) 01/2020   Cancer (HCC)    prostate   COVID 2021   mild case   Dysrhythmia    Aflutter   Heart failure with reduced ejection fraction (HCC)    Pt denies, it is in cardiologist notes.   Heart murmur    Hypertension    Hypoglycemia    occ   NICM (nonischemic cardiomyopathy) (HCC) 02/12/2020   Osteomyelitis of third toe of left foot (HCC) 08/16/2022   Prostate cancer (HCC) 2019   Rheumatoid arthritis [facility])    Past Surgical History:  Procedure Laterality Date   AMPUTATION Right 02/10/2022   Procedure: RIGHT TRANSMETATARSAL AMPUTATION;  Surgeon: Nadara Mustard, MD;  Location: [facility] OR;  Service: Orthopedics;  Laterality: Right;   AMPUTATION Right 03/10/2022   Procedure: RIGHT FOOT CHOPART AMPUTATION;  Surgeon: Nadara Mustard, MD;  Location: [facility] OR;  Service: Orthopedics;  Laterality: Right;   AMPUTATION Left 08/16/2022   Procedure: LEFT 3RD TOE AMPUTATION;  Surgeon: Nadara Mustard, MD;  Location: [facility] OR;  Service: Orthopedics;  Laterality: Left;   AMPUTATION Left 09/13/2022   Procedure: LEFT TRANSMETATARSAL AMPUTATION;  Surgeon: Nadara Mustard, MD;  Location: [facility] OR;  Service: Orthopedics;  Laterality: Left;   AMPUTATION TOE Right 03/26/2020   Procedure: Right 3rd toe partial amputation, bone biopsy right 1st metatarsal;  Surgeon: Ventura Sellers  J, DPM;  Location: [facility] ORS;  Service: Podiatry;  Laterality: Right;   BUBBLE STUDY  02/11/2020   Procedure: BUBBLE STUDY;  Surgeon: Sande Rives, MD;  Location: [facility] ENDOSCOPY;  Service: Cardiovascular;;   CARDIOVERSION N/A 02/11/2020   Procedure: CARDIOVERSION;  Surgeon: Sande Rives, MD;  Location: [facility] ENDOSCOPY;  Service: Cardiovascular;  Laterality: N/A;   CARDIOVERSION N/A 08/31/2021   Procedure: CARDIOVERSION;  Surgeon: Jake Bathe, MD;  Location: [facility] ENDOSCOPY;  Service: Cardiovascular;  Laterality: N/A;   CYSTOSCOPY   04/11/2018   Procedure: Derinda Late;  Surgeon: Crist Fat, MD;  Location: [facility];  Service: Urology;;  NO SEEDS FOUND IN BLADDER   HERNIA REPAIR  2009   inguinal   INTRAMEDULLARY (IM) NAIL INTERTROCHANTERIC Right 05/05/2020   Procedure: CPT 27245-Cephalomedullary nailing of right intertrochanteric femur fracture;  Surgeon: Roby Lofts, MD;  Location: [facility] OR;  Service: Orthopedics;  Laterality: Right;   INTRAMEDULLARY (IM) NAIL INTERTROCHANTERIC Right 12/24/2020   Procedure: REVISION FIXATION OF RIGHT INTERTROCHANTERIC FEMUR NONUNION;  Surgeon: Roby Lofts, MD;  Location: [facility] OR;  Service: Orthopedics;  Laterality: Right;   IRRIGATION AND DEBRIDEMENT FOOT Left 03/26/2020   Procedure: Left foot incision and drainage with removal of all non-viable soft tissue and bone - areas overlying the 2nd and 5th metatarsals.;  Surgeon: Park Liter, DPM;  Location: [facility] ORS;  Service: Podiatry;  Laterality: Left;   RADIOACTIVE SEED IMPLANT N/A 04/11/2018   Procedure: RADIOACTIVE SEED IMPLANT/BRACHYTHERAPY IMPLANT;  Surgeon: Crist Fat, MD;  Location: [facility];  Service: Urology;  Laterality: N/A;   71     SEEDS IMPLANTED   RIGHT/LEFT HEART CATH AND CORONARY ANGIOGRAPHY N/A 02/09/2020   Procedure: RIGHT/LEFT HEART CATH AND CORONARY ANGIOGRAPHY;  Surgeon: Yvonne Kendall, MD;  Location: [facility] INVASIVE CV LAB;  Service: Cardiovascular;  Laterality: N/A;   SPACE OAR INSTILLATION N/A 04/11/2018   Procedure: SPACE OAR INSTILLATION;  Surgeon: Crist Fat, MD;  Location: [facility];  Service: Urology;  Laterality: N/A;   STUMP REVISION Left 09/15/2022   Procedure: REVISION LEFT TRANSMETATARSAL AMPUTATION;  Surgeon: Nadara Mustard, MD;  Location: [facility] OR;  Service: Orthopedics;  Laterality: Left;   TEE WITHOUT CARDIOVERSION N/A 02/11/2020   Procedure: TRANSESOPHAGEAL ECHOCARDIOGRAM (TEE);  Surgeon: Sande Rives, MD;   Location: [facility] ENDOSCOPY;  Service: Cardiovascular;  Laterality: N/A;   TOTAL KNEE ARTHROPLASTY Bilateral 09/13/2015   Procedure: BILATERAL KNEE ARTHROPLASTY ;  Surgeon: Durene Romans, MD;  Location: [facility] ORS;  Service: Orthopedics;  Laterality: Bilateral;     A IV Location/Drains/Wounds Patient Lines/Drains/Airways Status     Active Line/Drains/Airways     Name Placement date Placement time Site Days   Peripheral IV 10/28/22 18 G Anterior;Left Forearm 10/28/22  1804  Forearm  1   Peripheral IV 10/28/22 20 G Posterior;Proximal;Right Forearm 10/28/22  2242  Forearm  1   Negative Pressure Wound Therapy Foot Anterior;Left 09/13/22  0750  --  46            Intake/Output Last 24 hours  Intake/Output Summary (Last 24 hours) at 10/29/2022 0109 Last data filed at 10/29/2022 0011 Gross per 24 hour  Intake 1504.74 ml  Output --  Net 1504.74 ml    Labs/Imaging Results for orders placed or performed during the hospital encounter of 10/28/22 (from the past 48 hour(s))  CBG monitoring, ED     Status: Abnormal   Collection Time: 10/28/22  5:55 PM  Result Value Ref Range   Glucose-Capillary 104 (H) 70 - 99 mg/dL    Comment: Glucose reference range applies only to samples taken after fasting for at least 8 hours.  Comprehensive metabolic panel     Status: Abnormal   Collection Time: 10/28/22  6:17 PM  Result Value Ref Range   Sodium 133 (L) 135 - 145 mmol/L   Potassium 3.9 3.5 - 5.1 mmol/L   Chloride 98 98 - 111 mmol/L   CO2 21 (L) 22 - 32 mmol/L   Glucose, Bld 94 70 - 99 mg/dL    Comment: Glucose reference range applies only to samples taken after fasting for at least 8 hours.   BUN 15 8 - 23 mg/dL   Creatinine, Ser 2.95 0.61 - 1.24 mg/dL   Calcium 8.6 (L) 8.9 - 10.3 mg/dL   Total Protein 6.5 6.5 - 8.1 g/dL   Albumin 3.3 (L) 3.5 - 5.0 g/dL   AST 38 15 - 41 U/L   ALT 18 0 - 44 U/L   Alkaline Phosphatase 79 38 - 126 U/L   Total Bilirubin 1.2 0.3 - 1.2 mg/dL   GFR, Estimated >62 >13  mL/min    Comment: (NOTE) Calculated using the CKD-EPI Creatinine Equation (2021)    Anion gap 14 5 - 15    Comment: Performed at [facility], [address], [place], [place] [address]  Ethanol     Status: None   Collection Time: 10/28/22  6:17 PM  Result Value Ref Range   Alcohol, Ethyl (B) <10 <10 mg/dL    Comment: (NOTE) Lowest detectable limit for serum alcohol is 10 mg/dL.  For medical purposes only. Performed at [facility], [address] [place], [place] [address]   CBC with Differential     Status: Abnormal   Collection Time: 10/28/22  6:17 PM  Result Value Ref Range   WBC 7.4 4.0 - 10.5 K/uL   RBC 3.64 (L) 4.22 - 5.81 MIL/uL   Hemoglobin 8.6 (L) 13.0 - 17.0 g/dL    Comment: Reticulocyte Hemoglobin testing may be clinically indicated, consider ordering this additional test [id number]    HCT 28.1 (L) 39.0 - 52.0 %   MCV 77.2 (L) 80.0 - 100.0 fL   MCH 23.6 (L) 26.0 - 34.0 pg   MCHC 30.6 30.0 - 36.0 g/dL   RDW 24.4 01.0 - 27.2 %   Platelets 223 150 - 400 K/uL    Comment: REPEATED TO VERIFY   nRBC 0.0 0.0 - 0.2 %   Neutrophils Relative % 69 %   Neutro Abs 5.1 1.7 - 7.7 K/uL   Lymphocytes Relative 15 %   Lymphs Abs 1.1 0.7 - 4.0 K/uL   Monocytes Relative 15 %   Monocytes Absolute 1.1 (H) 0.1 - 1.0 K/uL   Eosinophils Relative 0 %   Eosinophils Absolute 0.0 0.0 - 0.5 K/uL   Basophils Relative 0 %   Basophils Absolute 0.0 0.0 - 0.1 K/uL   Immature Granulocytes 1 %   Abs Immature Granulocytes 0.04 0.00 - 0.07 K/uL    Comment: Performed at [facility], [address] [place], [place] [address]  Lipase, blood     Status: None   Collection Time: 10/28/22  6:17 PM  Result Value Ref Range   Lipase 22 11 - 51 U/L    Comment: Performed at [facility], [address], [place], [place] [address]  Lactic acid, plasma  Status: Abnormal   Collection Time: 10/28/22  7:07 PM  Result Value Ref Range   Lactic Acid, Venous 2.6 (HH) 0.5 - 1.9  mmol/L    Comment: CRITICAL RESULT CALLED TO, READ BACK BY AND VERIFIED WITH E,TEASLEY RN @1951  10/28/22 E,BENTON Performed at [facility], [address], [place], [place] [address]   I-stat chem 8, ED     Status: Abnormal   Collection Time: 10/28/22  7:29 PM  Result Value Ref Range   Sodium 134 (L) 135 - 145 mmol/L   Potassium 3.6 3.5 - 5.1 mmol/L   Chloride 100 98 - 111 mmol/L   BUN 15 8 - 23 mg/dL   Creatinine, Ser 6.04 0.61 - 1.24 mg/dL   Glucose, Bld 95 70 - 99 mg/dL    Comment: Glucose reference range applies only to samples taken after fasting for at least 8 hours.   Calcium, Ion 0.97 (L) 1.15 - 1.40 mmol/L   TCO2 25 22 - 32 mmol/L   Hemoglobin 9.5 (L) 13.0 - 17.0 g/dL   HCT 54.0 (L) 98.1 - 19.1 %  Urinalysis, w/ Reflex to Culture (Infection Suspected) -Urine, Clean Catch     Status: Abnormal   Collection Time: 10/28/22  7:58 PM  Result Value Ref Range   Specimen Source URINE, CLEAN CATCH    Color, Urine YELLOW YELLOW   APPearance CLEAR CLEAR   Specific Gravity, Urine 1.019 1.005 - 1.030   pH 8.0 5.0 - 8.0   Glucose, UA NEGATIVE NEGATIVE mg/dL   Hgb urine dipstick NEGATIVE NEGATIVE   Bilirubin Urine NEGATIVE NEGATIVE   Ketones, ur NEGATIVE NEGATIVE mg/dL   Protein, ur 30 (A) NEGATIVE mg/dL   Nitrite NEGATIVE NEGATIVE   Leukocytes,Ua NEGATIVE NEGATIVE   RBC / HPF 0-5 0 - 5 RBC/hpf   WBC, UA 0-5 0 - 5 WBC/hpf    Comment:        Reflex urine culture not performed if WBC <=10, OR if Squamous epithelial cells >5. If Squamous epithelial cells >5 suggest recollection.    Bacteria, UA NONE SEEN NONE SEEN   Squamous Epithelial / HPF 0-5 0 - 5 /HPF   Mucus PRESENT     Comment: Performed at [facility], [address], [place], [place] [address]  Urine rapid drug screen (hosp performed)     Status: Abnormal   Collection Time: 10/28/22  7:58 PM  Result Value Ref Range   Opiates POSITIVE (A) NONE DETECTED   Cocaine NONE DETECTED NONE DETECTED   Benzodiazepines  POSITIVE (A) NONE DETECTED   Amphetamines NONE DETECTED NONE DETECTED   Tetrahydrocannabinol NONE DETECTED NONE DETECTED   Barbiturates NONE DETECTED NONE DETECTED    Comment: (NOTE) DRUG SCREEN FOR MEDICAL PURPOSES ONLY.  IF CONFIRMATION IS NEEDED FOR ANY PURPOSE, NOTIFY LAB WITHIN 5 DAYS.  LOWEST DETECTABLE LIMITS FOR URINE DRUG SCREEN Drug Class                     Cutoff (ng/mL) Amphetamine and metabolites    1000 Barbiturate and metabolites    200 Benzodiazepine                 200 Opiates and metabolites        300 Cocaine and metabolites        300 THC  50 Performed at [facility], [address] [place], [place] [address]   Lactic acid, plasma     Status: Abnormal   Collection Time: 10/28/22  8:47 PM  Result Value Ref Range   Lactic Acid, Venous 2.8 (HH) 0.5 - 1.9 mmol/L    Comment: CRITICAL VALUE NOTED. VALUE IS CONSISTENT WITH PREVIOUSLY REPORTED/CALLED VALUE Performed at [facility], [address], [place], [place] [address]    US Abdomen Limited RUQ (LIVER/GB)  Result Date: 10/28/2022 CLINICAL DATA:  Altered mental status, sepsis. EXAM: ULTRASOUND ABDOMEN LIMITED RIGHT UPPER QUADRANT COMPARISON:  None Available. FINDINGS: Gallbladder: No gallstones or wall thickening visualized (1.0 mm). No sonographic Murphy sign noted by sonographer. Common bile duct: Diameter: 10.8 mm Liver: No focal lesion identified. Diffusely increased echogenicity of the liver parenchyma is noted. Portal vein is patent on color Doppler imaging with normal direction of blood flow towards the liver. Other: None. IMPRESSION: 1. Hepatic steatosis without focal liver lesions. 2. Dilated common bile duct without evidence of an obstructing lesion. Further evaluation with MRCP is recommended. Electronically Signed   By: Aram Candela M.D.   On: 10/28/2022 23:52   CT ABDOMEN PELVIS W CONTRAST  Result Date: 10/28/2022 CLINICAL DATA:  Provided history of sepsis.  EXAM: CT ABDOMEN AND PELVIS WITH CONTRAST TECHNIQUE: Multidetector CT imaging of the abdomen and pelvis was performed using the standard protocol following bolus administration of intravenous contrast. RADIATION DOSE REDUCTION: This exam was performed according to the departmental dose-optimization program which includes automated exposure control, adjustment of the mA and/or kV according to patient size and/or use of iterative reconstruction technique. CONTRAST:  75mL OMNIPAQUE IOHEXOL 350 MG/ML SOLN COMPARISON:  None Available. FINDINGS: Lower chest: No pleural effusion or acute basilar airspace disease. Moderate-sized hiatal hernia. Hepatobiliary: No focal liver abnormality. Mild gallbladder distension. Possible small calcified gallstone. No pericholecystic inflammation. Mild common bile duct dilatation at 9 mm. No visualized choledocholithiasis. Pancreas: No ductal dilatation or inflammation. Spleen: Splenomegaly, 14 cm cranial caudal. No focal splenic abnormality. Adrenals/Urinary Tract: Normal adrenal glands. No hydronephrosis or perinephric edema. Homogeneous renal enhancement with symmetric excretion on delayed phase imaging. Urinary bladder is partially distended, streak artifact from right hip surgical hardware obscures detailed assessment. Stomach/Bowel: Moderate-sized hiatal hernia. There is no bowel obstruction or inflammation. Moderate volume of colonic stool. Normal appendix visualized. Mild sigmoid colonic diverticulosis without diverticulitis. Vascular/Lymphatic: Aortic atherosclerosis and tortuosity. No aneurysm. Patent portal vein. No acute vascular findings. There is a 16 mm left inguinal node, series 3, image 90. Reproductive: Brachytherapy seeds in the prostate. Other: No free air, free fluid, or intra-abdominal fluid collection. Musculoskeletal: Scoliosis with diffuse lumbar degenerative change. Surgical hardware in the right proximal femur. No acute osseous findings. IMPRESSION: 1. Mild  gallbladder distension with possible small calcified gallstone. No pericholecystic inflammation. Mild common bile duct dilatation at 9 mm. Recommend right upper quadrant ultrasound for further evaluation. 2. Mild sigmoid colonic diverticulosis without diverticulitis. 3. Moderate-sized hiatal hernia. 4. Splenomegaly. 5. Enlarged left inguinal node measuring 16 mm, nonspecific. Given history of prostate cancer, recommend correlation with PSA. Aortic Atherosclerosis (ICD10-I70.0). Electronically Signed   By: Narda Rutherford M.D.   On: 10/28/2022 20:54   CT Head Wo Contrast  Result Date: 10/28/2022 CLINICAL DATA:  Mental status change. EXAM: CT HEAD WITHOUT CONTRAST TECHNIQUE: Contiguous axial images were obtained from the base of the skull through the vertex without intravenous contrast. RADIATION DOSE REDUCTION: This exam was performed according to the departmental dose-optimization program  which includes automated exposure control, adjustment of the mA and/or kV according to patient size and/or use of iterative reconstruction technique. COMPARISON:  Head CT 05/04/2020 FINDINGS: Brain: No evidence of acute infarction, hemorrhage, hydrocephalus, extra-axial collection or mass lesion/mass effect. Again seen is mild diffuse atrophy. Vascular: Atherosclerotic calcifications are present within the cavernous internal carotid arteries. Skull: Normal. Negative for fracture or focal lesion. Sinuses/Orbits: No acute finding. Other: None. IMPRESSION: 1. No acute intracranial process. 2. Stable mild diffuse atrophy. Electronically Signed   By: Darliss Cheney M.D.   On: 10/28/2022 20:52   DG Foot Complete Left  Result Date: 10/28/2022 CLINICAL DATA:  Altered mental status, infection in foot at amputation site. EXAM: LEFT FOOT - COMPLETE 3+ VIEW COMPARISON:  Left foot radiographs 08/08/2022 FINDINGS: There has been interval transmetatarsal amputation of all five digits. There is irregularity of the cortex of the amputation  margin of the fifth metatarsal with significant surrounding soft tissue swelling. There is also possible cortical irregularity and lucency along the anteromedial of the cortex of the medial cuneiform. There is no definite soft tissue gas. IMPRESSION: Interval transmetatarsal amputation of all five digits with cortical irregularity at the amputation margin of fifth metatarsal, and possible lucency in the anteromedial aspect of the medial cuneiform, raising the possibility of osteomyelitis. MRI may be considered for further evaluation if indicated. Electronically Signed   By: Lesia Hausen M.D.   On: 10/28/2022 19:19   DG Chest Portable 1 View  Result Date: 10/28/2022 CLINICAL DATA:  Altered mental status EXAM: PORTABLE CHEST 1 VIEW COMPARISON:  05/22/2021 FINDINGS: Transverse diameter of heart is increased. There is lucency in retrocardiac region suggesting fixed hiatal hernia. There are no signs of alveolar pulmonary edema or focal pulmonary consolidation. There is no pleural effusion or pneumothorax. IMPRESSION: Cardiomegaly. Fixed hiatal hernia. There are no signs of pulmonary edema or focal pulmonary consolidation. Electronically Signed   By: Ernie Avena M.D.   On: 10/28/2022 19:08    Pending Labs Unresulted Labs (From admission, onward)     Start     Ordered   10/29/22 0500  CBC  Tomorrow morning,   R        10/28/22 2204   10/29/22 0500  Basic metabolic panel  Tomorrow morning,   R        10/28/22 2204   10/29/22 0000  Lactic acid, plasma  STAT Now then every 3 hours,   R      10/28/22 2250   10/28/22 2156  HIV Antibody (routine testing w rflx)  (HIV Antibody (Routine testing w reflex) panel)  Once,   R        10/28/22 2204   10/28/22 1816  Culture, blood (routine x 2)  BLOOD CULTURE X 2,   R      10/28/22 1818            Vitals/Pain Today's Vitals   10/28/22 1756 10/28/22 1906 10/28/22 2218 10/29/22 0012  BP: 117/64  (!) 145/71 (!) 161/107  Pulse: 91  97 (!) 101  Resp: 18   20 18   Temp: 98.4 F (36.9 C)  (!) 100.5 F (38.1 C) 97.8 F (36.6 C)  TempSrc: Oral  Oral Oral  SpO2: 98%  97% 98%  PainSc:  10-Worst pain ever 10-Worst pain ever     Isolation Precautions No active isolations  Medications Medications  vancomycin (VANCOREADY) IVPB 1500 mg/300 mL (has no administration in time range)  oxyCODONE (Oxy IR/ROXICODONE) immediate release tablet 30  mg (has no administration in time range)  tamsulosin (FLOMAX) capsule 0.4 mg (has no administration in time range)  lactated ringers infusion ( Intravenous New Bag/Given 10/29/22 0011)  polyethylene glycol (MIRALAX / GLYCOLAX) packet 17 g (has no administration in time range)  enoxaparin (LOVENOX) injection 40 mg (has no administration in time range)  acetaminophen (TYLENOL) tablet 650 mg (has no administration in time range)  metoprolol succinate (TOPROL-XL) 24 hr tablet 12.5 mg (has no administration in time range)  amiodarone (PACERONE) tablet 100 mg (has no administration in time range)  sodium chloride 0.9 % bolus 1,000 mL (0 mLs Intravenous Stopped 10/28/22 1957)  HYDROmorphone (DILAUDID) injection 1 mg (1 mg Intravenous Given 10/28/22 1829)  HYDROmorphone (DILAUDID) injection 1 mg (1 mg Intravenous Given 10/28/22 2003)  cefTRIAXone (ROCEPHIN) 2 g in sodium chloride 0.9 % 100 mL IVPB (0 g Intravenous Stopped 10/28/22 2118)  vancomycin (VANCOREADY) IVPB 2000 mg/400 mL (0 mg Intravenous Stopped 10/29/22 0011)  iohexol (OMNIPAQUE) 350 MG/ML injection 75 mL (75 mLs Intravenous Contrast Given 10/28/22 2026)  haloperidol lactate (HALDOL) injection 2 mg (2 mg Intravenous Given 10/28/22 2242)    Mobility walks with device     Focused Assessments Neuro Assessment Handoff:  Swallow screen pass? Yes          Neuro Assessment: Exceptions to WDL Neuro Checks:      Has TPA been given? No If patient is a Neuro Trauma and patient is going to OR before floor call report to 4N Charge nurse: [phone] or  [phone]   R Recommendations: See Admitting Provider Note  Report given to:   Additional Notes: Pt is sometimes oriented to self. Pt has bouts of being very confused and tries to get out of bed. Pt will not leave monitoring equipment on.

## 2022-10-29 NOTE — Assessment & Plan Note (Addendum)
Differential remains broad.  Certainly this possibility that this is due to an infection of his foot given that he came in with a fever.  Curiously, does not have an elevated white count.  Lactic acid was only minimally elevated on admission.  Not clear picture.  Bizarre behavior raises concern for frontotemporal dementia versus drug abuse.  Could also consider paraneoplastic process given history of prostate cancer with enlarged inguinal lymph nodes send also history of FOBT positive now with hemoglobin, having never had a colonoscopy. -MR Arlys John pending -Follow-up blood cultures -TSH, RPR, PSA -Dr. Lajoyce Corners to see for evaluation of foot wound  -LR at 100 mL/h, s/p 1L NS in ED -Neurochecks every 4 hours -Delirium precautions -Home HS valium ordered to avoid withdrawals

## 2022-10-30 DIAGNOSIS — E876 Hypokalemia: Secondary | ICD-10-CM | POA: Insufficient documentation

## 2022-10-30 DIAGNOSIS — R410 Disorientation, unspecified: Secondary | ICD-10-CM | POA: Diagnosis not present

## 2022-10-30 DIAGNOSIS — D509 Iron deficiency anemia, unspecified: Secondary | ICD-10-CM | POA: Diagnosis not present

## 2022-10-30 DIAGNOSIS — R4182 Altered mental status, unspecified: Secondary | ICD-10-CM | POA: Diagnosis not present

## 2022-10-30 LAB — COMPREHENSIVE METABOLIC PANEL
ALT: 15 U/L (ref 0–44)
AST: 24 U/L (ref 15–41)
Albumin: 2.6 g/dL — ABNORMAL LOW (ref 3.5–5.0)
Alkaline Phosphatase: 55 U/L (ref 38–126)
Anion gap: 9 (ref 5–15)
BUN: 7 mg/dL — ABNORMAL LOW (ref 8–23)
CO2: 24 mmol/L (ref 22–32)
Calcium: 8 mg/dL — ABNORMAL LOW (ref 8.9–10.3)
Chloride: 104 mmol/L (ref 98–111)
Creatinine, Ser: 0.9 mg/dL (ref 0.61–1.24)
GFR, Estimated: >60 mL/min (ref >60)
Glucose, Bld: 83 mg/dL (ref 70–99)
Potassium: 3.3 mmol/L — ABNORMAL LOW (ref 3.5–5.1)
Sodium: 137 mmol/L (ref 135–145)
Total Bilirubin: 0.7 mg/dL (ref 0.3–1.2)
Total Protein: 5.5 g/dL — ABNORMAL LOW (ref 6.5–8.1)

## 2022-10-30 LAB — CBC
HCT: 24.5 % — ABNORMAL LOW (ref 39.0–52.0)
Hemoglobin: 7.4 g/dL — ABNORMAL LOW (ref 13.0–17.0)
MCH: 23.1 pg — ABNORMAL LOW (ref 26.0–34.0)
MCHC: 30.2 g/dL (ref 30.0–36.0)
MCV: 76.3 fL — ABNORMAL LOW (ref 80.0–100.0)
Platelets: 177 10*3/uL (ref 150–400)
RBC: 3.21 MIL/uL — ABNORMAL LOW (ref 4.22–5.81)
RDW: 15.3 % (ref 11.5–15.5)
WBC: 3.2 10*3/uL — ABNORMAL LOW (ref 4.0–10.5)
nRBC: 0 % (ref 0.0–0.2)

## 2022-10-30 LAB — CULTURE, BLOOD (ROUTINE X 2)

## 2022-10-30 LAB — IRON AND TIBC
Iron: 5 ug/dL — ABNORMAL LOW (ref 45–182)
Saturation Ratios: 2 % — ABNORMAL LOW (ref 17.9–39.5)
TIBC: 259 ug/dL (ref 250–450)
UIBC: 254 ug/dL

## 2022-10-30 MED ORDER — OXYCODONE HCL 5 MG PO TABS
20.0000 mg | ORAL_TABLET | Freq: Four times a day (QID) | ORAL | Status: DC | PRN
  Administered 2022-10-30 – 2022-10-31 (×4): 20 mg via ORAL
  Filled 2022-10-30 (×4): qty 4

## 2022-10-30 MED ORDER — POTASSIUM CHLORIDE CRYS ER 20 MEQ PO TBCR
40.0000 meq | EXTENDED_RELEASE_TABLET | Freq: Once | ORAL | Status: AC
  Administered 2022-10-30: 40 meq via ORAL
  Filled 2022-10-30: qty 2

## 2022-10-30 NOTE — Assessment & Plan Note (Signed)
Noted on imaging during admission. His LFTs are all WNL and he is without abdominal pain. Very unlikely to be causing his symptoms at present.  - Should he develop worsening signs of infection or abdominal symptoms, would pursue MRCP, hold off for now 

## 2022-10-30 NOTE — Assessment & Plan Note (Addendum)
Patient Ox4 in the AM but altered for Dr. Deirdre Priest. MRI brain negative for any abnormalities. At this  time, differential includes delirium 2/2 to infection in foot, opoid use, with less likely paraneoplastic process given hx of prostate cancer though no inguinal lymphadenopathy noted. Pt admitted with fever with no leukocytosis with minially elevated lactic acid. Certainly this possibility that this is due to an infection of his foot given that he came in with a fever.  Curiously, does not have an elevated white count. Lactic acid was only minimally elevated on admission. Blood culture showed no growth x 2 days. TSH WNL, RPR non reactive, PSA 0.05.  - Decreased oxy from q6 to q4 d/t patients mental status after receiving medication  - Blood culture showed no growth x2 days -Dr. Lajoyce Corners to see for evaluation of foot wound  -Neurochecks every 4 hours -Delirium precautions -Home HS valium ordered to avoid withdrawals

## 2022-10-30 NOTE — Progress Notes (Cosign Needed Addendum)
Daily Progress Note Intern Pager: [phone]  Patient name: Thomas [name] Medical record number: [id number] Date of birth: [birth year] Age: 72 y.o. Gender: male  Primary Care Provider: Carney Living, MD Consultants: Gaylord Shih, WOC  Code Status: DNR  Pt Overview and Major Events to Date:  7/7- admitted, displayed some aggressive behavior O/N, ortho consulted and signed off on patient 7/8 -   Assessment and Plan: Patient is a 72 yo M with PMHx of A flutter, HTN, h/o prostate cancer who presents for AMS. Source of AMS remains uncertain. It seems status waxes and wanes during the day. Today patient was A&Ox4 with Patient on q4 oxycodone for pain which may be correlated to his AMS. Less likely d/t infection WBCs not elevated, lactic acid not impressive, and patient has been afebrile since admission. Patient had low iron level with Hg of 7.4- patient denies any bloody stools or vomiting blood.   Hospital Problem List      Hospital     * (Principal) Altered mental status     Patient Ox4 in the AM but altered for Dr. Deirdre Priest. MRI brain negative  for any abnormalities. At this  time, differential includes delirium 2/2  to infection in foot, opoid use, with less likely paraneoplastic process  given hx of prostate cancer though no inguinal lymphadenopathy noted. Pt  admitted with fever with no leukocytosis with minially elevated lactic  acid. Certainly this possibility that this is due to an infection of his  foot given that he came in with a fever.  Curiously, does not have an  elevated white count. Lactic acid was only minimally elevated on  admission. Blood culture showed no growth x 2 days. TSH WNL, RPR non  reactive, PSA 0.05.  - Decreased oxy from q6 to q4 d/t patients mental status after receiving  medication  - Blood culture showed no growth x2 days -Dr. Lajoyce Corners to see for evaluation of foot wound  -Neurochecks every 4 hours -Delirium precautions -Home HS valium ordered  to avoid withdrawals        Malignant neoplasm of prostate (HCC)     History of. 16mm inguinal lymph node noted on admission imaging. Unable  to palpate on exam this AM. PSA not elevated, TSH WNL. - plan to f/u with PCP as needed         Dehiscence of amputation stump of left lower extremity (HCC)     He had revision of left transmetatarsal amputation on 09/15/2022.   Recently seen on 6/25 by Dr. Lajoyce Corners.  He was prescribed Bactrim DS at home.  Patient states he was compliant with medication.  -Awaiting ortho eval this am -Oxycodone IR 30 mg every 6 hours as needed -Vancomycin per pharmacy - consider doxycyline +/- augmentin o/p when appropriate  - PT/OT         Anemia     Hgb 7.4 today. Hx of FOBT + but never followed up with colonoscopy. Has  never had a colonoscopy. Patient made aware of this, patient would like to  follow up regarding IDA outpatient.  -Holding Eliquis - consider IV iron transfusion before discharge         Common bile duct dilatation     Noted on imaging during admission. His LFTs are all WNL and he is  without abdominal pain. Very unlikely to be causing his symptoms at  present.  - Should he develop worsening signs of infection or abdominal symptoms,  would pursue MRCP, hold  off for now        Hypokalemia  - repleted 40 mEq po  FEN/GI: regular diet  PPx: lovenox  Dispo:Home pending clinical improvement  Subjective:  Patient seen at bedside this AM- patient is AXO4. Patient was able to tell us his plan of care as well as his current medications. Patient had no complaints at this time.   On reassessment, patient is dosing off but able to redirected when questioned.   Objective: Temp:  [98 F (36.7 C)-99.3 F (37.4 C)] 98.5 F (36.9 C) (07/08 1145) Pulse Rate:  [69-94] 69 (07/08 1145) Resp:  [16-20] 18 (07/08 1145) BP: (118-155)/(61-87) 125/71 (07/08 1145) SpO2:  [96 %-100 %] 97 % (07/08 1145) Physical Exam:  Physical Exam Constitutional:       Appearance: Normal appearance.     Comments: Waving and waning alertness   Eyes:     Comments: Pinpoint pupils  Cardiovascular:     Rate and Rhythm: Normal rate and regular rhythm.     Comments: Mild systolic murmur Abdominal:     General: Abdomen is flat.     Palpations: Abdomen is soft.  Musculoskeletal:     Comments: Bilateral transmetatarsal amputation. Compression sock on L extremity with mild exudate visible through sock   Neurological:     Comments: Waxing and waning presenation. When alert, is A&Ox4. However, upon reassessment patient was somnolent and slow to answer questions       Laboratory: Most recent CBC Lab Results  Component Value Date   WBC 3.2 (L) 10/30/2022   HGB 7.4 (L) 10/30/2022   HCT 24.5 (L) 10/30/2022   MCV 76.3 (L) 10/30/2022   PLT 177 10/30/2022   Most recent BMP    Latest Ref Rng & Units 10/30/2022    6:20 AM  BMP  Glucose 70 - 99 mg/dL 83   BUN 8 - 23 mg/dL 7   Creatinine 1.69 - 6.78 mg/dL 9.38   Sodium 101 - 751 mmol/L 137   Potassium 3.5 - 5.1 mmol/L 3.3   Chloride 98 - 111 mmol/L 104   CO2 22 - 32 mmol/L 24   Calcium 8.9 - 10.3 mg/dL 8.0       Imaging/Diagnostic Tests: No new imaging   Cyd Silence, MD 10/30/2022, 4:33 PM  PGY-1, [facility] FPTS Intern pager: [phone], text pages welcome Secure chat group [facility] Teaching Service

## 2022-10-30 NOTE — Plan of Care (Signed)
?  Problem: Education: ?Goal: Knowledge of General Education information will improve ?Description: Including pain rating scale, medication(s)/side effects and non-pharmacologic comfort measures ?Outcome: Progressing ?  ?Problem: Clinical Measurements: ?Goal: Respiratory complications will improve ?Outcome: Progressing ?  ?Problem: Activity: ?Goal: Risk for activity intolerance will decrease ?Outcome: Progressing ?  ?Problem: Nutrition: ?Goal: Adequate nutrition will be maintained ?Outcome: Progressing ?  ?Problem: Coping: ?Goal: Level of anxiety will decrease ?Outcome: Progressing ?  ?Problem: Elimination: ?Goal: Will not experience complications related to urinary retention ?Outcome: Progressing ?  ?Problem: Pain Managment: ?Goal: General experience of comfort will improve ?Outcome: Progressing ?  ?Problem: Skin Integrity: ?Goal: Risk for impaired skin integrity will decrease ?Outcome: Progressing ?  ?

## 2022-10-30 NOTE — Assessment & Plan Note (Addendum)
Hgb 7.4 today. Hx of FOBT + but never followed up with colonoscopy. Has never had a colonoscopy. Patient made aware of this, patient would like to follow up regarding IDA outpatient.  -Holding Eliquis - consider IV iron transfusion before discharge

## 2022-10-30 NOTE — Assessment & Plan Note (Addendum)
He had revision of left transmetatarsal amputation on 09/15/2022.  Recently seen on 6/25 by Dr. Lajoyce Corners.  He was prescribed Bactrim DS at home. Patient states he was compliant with medication.  -Awaiting ortho eval this am -Oxycodone IR 30 mg every 6 hours as needed -Vancomycin per pharmacy - consider doxycyline +/- augmentin o/p when appropriate  - PT/OT

## 2022-10-30 NOTE — Consult Note (Signed)
WOC nurse consulted for surgical wound dehiscence. This patient is followed by Dr. Lajoyce Corners and has specific orders for stump shrinker to be in direct contact with wound bed. I have asked bedside nursing to reach out to Dr. Lajoyce Corners related to wound care because the shrinker is therapeutic for the wound and will not routinely want a dressing between the wound and device.    Re consult if needed, will not follow at this time. Thanks  Raynisha Avilla [employer], RN,CWOCN, CNS, CWON-AP [phone])

## 2022-10-30 NOTE — TOC Initial Note (Signed)
Transition of Care [facility]) - Initial/Assessment Note    Patient Details  Name: Thomas [name] MRN: [id number] Date of Birth: April 17, 1951  Transition of Care [facility]) CM/SW Contact:    Kermit Balo, RN Phone Number: 10/30/2022, 2:10 PM  Clinical Narrative:                 CM met with the patient and he was stuck on his pain medications. He is upset the "schedule" he was on at home is not being followed. He asked to see the MD to go over getting his medications adjusted. CM has sent message to MD. Pt lives with his spouse and son. Someone is with him most of the time. Wife provides needed transportation. Pt manages his own medications at home and he denies any issues.  No f/u per PT/OT. TOC following.   Expected Discharge Plan: Home/Self Care Barriers to Discharge: Continued Medical Work up   Patient Goals and CMS Choice            Expected Discharge Plan and Services   Discharge Planning Services: CM Consult   Living arrangements for the past 2 months: Single Family Home                                      Prior Living Arrangements/Services Living arrangements for the past 2 months: Single Family Home Lives with:: Spouse Patient language and need for interpreter reviewed:: Yes Do you feel safe going back to the place where you live?: Yes        Care giver support system in place?: Yes (comment) Current home services: DME (cane/ walker/ shower seat/ bars) Criminal Activity/Legal Involvement Pertinent to Current Situation/Hospitalization: No - Comment as needed  Activities of Daily Living Home Assistive Devices/Equipment: None ADL Screening (condition at time of admission) Patient's cognitive ability adequate to safely complete daily activities?: Yes Is the patient deaf or have difficulty hearing?: No Does the patient have difficulty seeing, even when wearing glasses/contacts?: No Does the patient have difficulty concentrating, remembering, or making  decisions?: Yes Patient able to express need for assistance with ADLs?: Yes Does the patient have difficulty dressing or bathing?: No Independently performs ADLs?: Yes (appropriate for developmental age) Does the patient have difficulty walking or climbing stairs?: No Weakness of Legs: None Weakness of Arms/Hands: None  Permission Sought/Granted                  Emotional Assessment Appearance:: Appears stated age Attitude/Demeanor/Rapport: Engaged Affect (typically observed): Frustrated Orientation: : Oriented to Self, Oriented to Place, Oriented to Situation   Psych Involvement: No (comment)  Admission diagnosis:  Altered mental status [R41.82] Sepsis, due to unspecified organism, unspecified whether acute organ dysfunction present [facility]) [A41.9] Patient Active Problem List   Diagnosis Date Noted   Common bile duct dilatation 10/29/2022   Altered mental status 10/28/2022   Anemia 10/28/2022   Wound dehiscence, surgical 09/19/2022   Depressed mood 06/20/2022   Dehiscence of amputation stump of left lower extremity (HCC) 03/10/2022   History of transmetatarsal amputation of right foot (HCC) 02/15/2022   Chronic osteomyelitis of toe, right (HCC) 02/10/2022   Fracture of intertrochanteric section of femur, closed, right, with nonunion, subsequent encounter 12/24/2020   Inguinal hernia 10/18/2020   Chronic combined systolic and diastolic CHF, NYHA class 2 (HCC)    Osteoporosis 05/05/2020   Foot infection    NICM (nonischemic  cardiomyopathy) (HCC), no significant CAD on cardiac cath   02/12/2020   Right foot pain    Rheumatoid arthritis (HCC)    Atrial flutter (HCC) 02/02/2020   Lower extremity edema 12/30/2019   Weight loss, non-intentional 11/24/2019   Chronic right hip pain 06/19/2019   Degenerative tear of acetabular labrum of right hip 05/02/2019   Primary osteoarthritis of right hip 05/02/2019   Skin ulcer (HCC) 01/29/2019   Malignant neoplasm of prostate (HCC)  01/16/2018   Insomnia 10/02/2017   S/P bilateral TKA 09/13/2015   Other bilateral secondary osteoarthritis of knee 07/14/2014   Heme positive stool 05/21/2013   Osteoarthritis, multiple sites 01/13/2011   Hypertension 12/06/2010   CLAUSTROPHOBIA 03/01/2010   PCP:  Carney Living, MD Pharmacy:   [facility] [phone] [place], [place] - [address] [place] [address] Phone: [phone] Fax: [phone]     Social Determinants of Health (SDOH) Social History: SDOH Screenings   Food Insecurity: No Food Insecurity (10/30/2022)  Housing: Medium Risk (10/30/2022)  Transportation Needs: No Transportation Needs (10/30/2022)  Utilities: At Risk (10/30/2022)  Depression (PHQ2-9): High Risk (06/20/2022)  Tobacco Use: Low Risk  (10/28/2022)   SDOH Interventions:     Readmission Risk Interventions     No data to display

## 2022-10-30 NOTE — Plan of Care (Signed)

## 2022-10-30 NOTE — Hospital Course (Addendum)
Thomas [name] is a 72 year old male presenting with altered mental status.  Admitted to the family medicine teaching service for further workup.  Hospital course is as follows:  Altered mental status Patient did initially present febrile, however this resolved and did not recur during hospitalization.  Patient initially presented with lactic acidosis (2.9) but this resolved after patient received 1 L fluid bolus. Brain MRI was negative.  TSH WNL.  RPR and HIV nonreactive.  UA WNL.  UDS positive for benzodiazepines and opiates, both of which are prescribed to this patient. Decreased patients oxycodone which improved patients mentation. Conversation with wife to ensure wife is managing medication.   Malignant neoplasm of prostate Patient was found to have enlarged left inguinal lymph node.  He is status post seed implant in 2019.  PSA in hospital was 0.05. Problem was not pursued further for patient.   Dehiscence of LLE amputation stump Patient was seen by Dr. Lajoyce Corners with orthopedics and it was determined that patient did not require any surgical intervention.  Given a stump shrinker sock. Dr. Lajoyce Corners recommended outpatient follow-up in 1 week. Patient received vancomycin in hospital. Patient discharged with oral course of doxycycline.   Anemia Hemoglobin 8.0.  His baseline appears to be around 11. Likely d/t malnutrition and iron defiency anemia. Held Eliquis during hospitalization Patient received iron transfusion prior to discharge.  No obvious source of active bleeding.  Hypokalemia Patient was found to have K of  3.3, which was replete in the hospital.   Follow-up recommendations: 1. consider outpatient follow-up with oncology to investigate PSA and enlarged inguinal lymph node. 2.  Outpatient with orthopedics/Dr. Lajoyce Corners 3.  Consider outpatient colonoscopy 4. Follow up with your cardiologist regarding restarting eliquis

## 2022-10-30 NOTE — Assessment & Plan Note (Signed)
History of. 16mm inguinal lymph node noted on admission imaging. Unable to palpate on exam this AM. PSA not elevated, TSH WNL. - plan to f/u with PCP as needed

## 2022-10-30 NOTE — Evaluation (Signed)
Occupational Therapy Evaluation Patient Details Name: Thomas [name] MRN: [id number] DOB: 06-Jun-1950 Today's Date: 10/30/2022   History of Present Illness Thomas [name] is a 72yo male who presented with altered mental status. PMHx: HFrEF, AFib, RA, HTN, Depression/Anxiety, and multiple toe amputations 2/2 osteomyelitis,ORIF of right intertrochanteric femur fracture (2022)   Clinical Impression   Pt evaluated s/p above admission list. Pt reports independence with ADL/IADLs, driving and functional mobility at baseline. Pt presents this session with pain, decreased balance and cognition with pt slightly impulsive. Pt A+Ox4 and able to recall LLE weight-bearing precautions, however required min cues to maintain. Pt educated on and discussed use of tub transfer bench to maintain LLE weight-bearing precautions during showers with pt verbalizing understanding. Pt currently requires setup A for seated UB ADLs and min guard A for LB ADLs. Pt completed functional transfers and room level mobility with use of RW with min guard A and min cues to maintain LLE precautions. Pt would benefit from continued acute OT services to maximize functional independence and facilitate transition to pt's natural home environment. Pt does not require follow up OT services upon discharge.      Recommendations for follow up therapy are one component of a multi-disciplinary discharge planning process, led by the attending physician.  Recommendations may be updated based on patient status, additional functional criteria and insurance authorization.   Assistance Recommended at Discharge Intermittent Supervision/Assistance  Patient can return home with the following A little help with walking and/or transfers;A little help with bathing/dressing/bathroom;Assistance with cooking/housework;Assist for transportation;Help with stairs or ramp for entrance    Functional Status Assessment  Patient has had a recent decline in their  functional status and demonstrates the ability to make significant improvements in function in a reasonable and predictable amount of time.  Equipment Recommendations  Tub/shower bench    Recommendations for Other Services       Precautions / Restrictions Precautions Precautions: Fall Required Braces or Orthoses: Other Brace Other Brace: Dr. Lajoyce Corners wants shrinker on 24/7 Restrictions Weight Bearing Restrictions: No Other Position/Activity Restrictions: no strict WB precautions but Dr Lajoyce Corners wrote to "minimize WB'ing" in his note      Mobility Bed Mobility Overal bed mobility: Modified Independent             General bed mobility comments: HOB elevated    Transfers Overall transfer level: Needs assistance Equipment used: Rolling walker (2 wheels) Transfers: Sit to/from Stand Sit to Stand: Min guard           General transfer comment: STS transfer from EOB and room level mobility using RW with min guard A and min verbal cues for hand placement      Balance Overall balance assessment: Mild deficits observed, not formally tested                                         ADL either performed or assessed with clinical judgement   ADL Overall ADL's : Needs assistance/impaired Eating/Feeding: Set up;Sitting   Grooming: Min guard;Standing;Wash/dry hands   Upper Body Bathing: Set up;Sitting   Lower Body Bathing: Min guard;Sit to/from stand   Upper Body Dressing : Set up;Sitting   Lower Body Dressing: Min guard;Sit to/from stand   Toilet Transfer: Min guard;Ambulation;Regular Teacher, adult education Details (indicate cue type and reason): min cues for hand placement Toileting- Clothing Manipulation and Hygiene: Min guard;Sit to/from stand  Toileting - Clothing Manipulation Details (indicate cue type and reason): posterior hygiene     Functional mobility during ADLs: Min guard;Rolling walker (2 wheels) General ADL Comments: limited secondary to pain,  decreased balance, L foot precautions     Vision Baseline Vision/History: 0 No visual deficits Ability to See in Adequate Light: 0 Adequate Vision Assessment?: No apparent visual deficits     Perception Perception Perception Tested?: No   Praxis Praxis Praxis tested?: Not tested    Pertinent Vitals/Pain Pain Assessment Pain Assessment: Faces Faces Pain Scale: Hurts little more Pain Location: L foot and R hip Pain Descriptors / Indicators: Discomfort, Guarding Pain Intervention(s): Monitored during session, Limited activity within patient's tolerance     Hand Dominance Right   Extremity/Trunk Assessment Upper Extremity Assessment Upper Extremity Assessment: Overall WFL for tasks assessed   Lower Extremity Assessment Lower Extremity Assessment: Defer to PT evaluation   Cervical / Trunk Assessment Cervical / Trunk Assessment: Normal   Communication Communication Communication: No difficulties   Cognition Arousal/Alertness: Awake/alert Behavior During Therapy: Impulsive Overall Cognitive Status: Impaired/Different from baseline Area of Impairment: Memory, Problem solving                     Memory: Decreased recall of precautions, Decreased short-term memory       Problem Solving: Requires verbal cues General Comments: A+O x4, slightly impulsive with transfers. Pt able to recall L foot precautions, however required min verbal cues to maintain.     General Comments  VSS on RA    Exercises     Shoulder Instructions      Home Living Family/patient expects to be discharged to:: Private residence Living Arrangements: Spouse/significant other;Children Available Help at Discharge: Family;Available 24 hours/day Type of Home: House Home Access: Level entry     Home Layout: One level     Bathroom Shower/Tub: Chief Strategy Officer: Standard Bathroom Accessibility: Yes   Home Equipment: Agricultural consultant (2 wheels);Cane - single  point;BSC/3in1;Grab bars - toilet;Grab bars - tub/shower   Additional Comments: lives with wife and son, both work but opposing schedules      Prior Functioning/Environment Prior Level of Function : Independent/Modified Independent;Working/employed;Driving             Mobility Comments: 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, 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 though ADLs Comments: Reports independence with ADL/IADLs and driving        OT Problem List: Pain;Decreased strength;Decreased activity tolerance;Impaired balance (sitting and/or standing);Decreased safety awareness;Decreased cognition      OT Treatment/Interventions: Self-care/ADL training;Therapeutic exercise;Energy conservation;DME and/or AE instruction;Cognitive remediation/compensation;Patient/family education;Balance training    OT Goals(Current goals can be found in the care plan section) Acute Rehab OT Goals Patient Stated Goal: to get back to bed OT Goal Formulation: With patient Time For Goal Achievement: 11/13/22 Potential to Achieve Goals: Good ADL Goals Pt Will Perform Grooming: with modified independence;standing Pt Will Perform Lower Body Dressing: with modified independence;sit to/from stand Pt Will Transfer to Toilet: with modified independence;ambulating;regular height toilet Pt Will Perform Toileting - Clothing Manipulation and hygiene: with modified independence;sit to/from stand  OT Frequency: Min 2X/week    Co-evaluation              AM-PAC OT "6 Clicks" Daily Activity     Outcome Measure Help from another person eating meals?: None Help from another person taking care of personal grooming?: A Little Help from another person toileting, which includes using toliet,  bedpan, or urinal?: A Little Help from another person bathing (including washing, rinsing, drying)?: A Little Help from another person to put on and taking off regular upper body clothing?: A Little Help from  another person to put on and taking off regular lower body clothing?: A Little 6 Click Score: 19   End of Session Equipment Utilized During Treatment: Rolling walker (2 wheels) Nurse Communication: Mobility status  Activity Tolerance: Patient tolerated treatment well Patient left: in bed;with call bell/phone within reach;with bed alarm set  OT Visit Diagnosis: Unsteadiness on feet (R26.81);Pain                Time: 1223-1252 OT Time Calculation (min): 29 min Charges:  OT General Charges $OT Visit: 1 Visit OT Evaluation $OT Eval Moderate Complexity: 1 Mod OT Treatments $Self Care/Home Management : 8-22 mins  Sherley Bounds, OTS Acute Rehabilitation Services Office [phone] Secure Chat Communication Preferred   Sherley Bounds 10/30/2022, 1:29 PM

## 2022-10-30 NOTE — Evaluation (Signed)
Physical Therapy Evaluation Patient Details Name: Thomas [name] MRN: [id number] DOB: [birth year] Today's Date: 10/30/2022  History of Present Illness  Rayme Stetz III is a 72yo male who presented with altered mental status. PMHx: HFrEF, AFib, RA, HTN, Depression/Anxiety, and multiple toe amputations 2/2 osteomyelitis,ORIF of right intertrochanteric femur fracture (2022  Clinical Impression  Pt admitted with above diagnosis. Pt received in bed but trying to slide out end of bed beyond rail. Had been asleep before MD entered room and was confused and impulsive. As he became more awake his cognition improved but remains impulsive as well as needing frequent cues and education for safety and appropriate WB'ing. Min guard A to stand and ambulate within room and to bathroom with RW. Reinforced that he needs to use RW instead of cane at this time to minimize WB'ing. Pt will be home with wife and family. Do not anticipate him needing PT at d/c. Pt currently with functional limitations due to the deficits listed below (see PT Problem List). Pt will benefit from acute skilled PT to increase their independence and safety with mobility to allow discharge.           Assistance Recommended at Discharge Intermittent Supervision/Assistance  If plan is discharge home, recommend the following:  Can travel by private vehicle  A little help with walking and/or transfers;A little help with bathing/dressing/bathroom;Assistance with cooking/housework;Assist for transportation        Equipment Recommendations Wheelchair (measurements PT);Wheelchair cushion (measurements PT)  Recommendations for Other Services       Functional Status Assessment Patient has had a recent decline in their functional status and demonstrates the ability to make significant improvements in function in a reasonable and predictable amount of time.     Precautions / Restrictions Precautions Precautions: Fall Restrictions Weight  Bearing Restrictions: No Other Position/Activity Restrictions: no strict WB precautions but Dr Lajoyce Corners wrote to "minimize WB'ing" in his note      Mobility  Bed Mobility Overal bed mobility: Modified Independent             General bed mobility comments: pt coming to EOB independently, actually trying to slide out beyond end of rail when PT entering room    Transfers Overall transfer level: Needs assistance Equipment used: Rolling walker (2 wheels) Transfers: Sit to/from Stand, Bed to chair/wheelchair/BSC Sit to Stand: Min guard   Step pivot transfers: Min guard       General transfer comment: min guard for safety, vc's for hand placement    Ambulation/Gait Ambulation/Gait assistance: Min guard Gait Distance (Feet): 40 Feet Assistive device: Rolling walker (2 wheels) Gait Pattern/deviations: Step-through pattern Gait velocity: decreased Gait velocity interpretation: 1.31 - 2.62 ft/sec, indicative of limited community ambulator   General Gait Details: vc's for proximity to RW as well as education on why he needs to use RW currently instead of his preferred cane. vc's for offsetting wt from LLE onto UE's when stepping RLE. vc's for safety.  Stairs            Wheelchair Mobility     Tilt Bed    Modified Rankin (Stroke Patients Only)       Balance Overall balance assessment: Mild deficits observed, not formally tested                                           Pertinent Vitals/Pain Pain Assessment Pain Assessment:  No/denies pain    Home Living Family/patient expects to be discharged to:: Private residence Living Arrangements: Spouse/significant other;Children Available Help at Discharge: Family;Available 24 hours/day Type of Home: House Home Access: Level entry       Home Layout: One level Home Equipment: Agricultural consultant (2 wheels);Cane - single point;BSC/3in1;Grab bars - toilet;Grab bars - tub/shower Additional Comments: lives  with wife and son, both work but opposing schedules    Prior Function Prior Level of Function : Independent/Modified Independent;Working/employed;Driving             Mobility Comments: reports that he was working up until 3 mos ago, 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 though ADLs Comments: independence     Hand Dominance   Dominant Hand: Right    Extremity/Trunk Assessment   Upper Extremity Assessment Upper Extremity Assessment: Overall WFL for tasks assessed    Lower Extremity Assessment Lower Extremity Assessment: Overall WFL for tasks assessed    Cervical / Trunk Assessment Cervical / Trunk Assessment: Normal (6'8")  Communication   Communication: No difficulties  Cognition Arousal/Alertness: Awake/alert Behavior During Therapy: Impulsive Overall Cognitive Status: Impaired/Different from baseline Area of Impairment: Memory, Problem solving                     Memory: Decreased recall of precautions, Decreased short-term memory       Problem Solving: Requires verbal cues General Comments: pt recently woken by MD and somewhat confused initially but improved as he was up longer. Mildly impulsive and not needs vc's for keeping weight in L heel as well as minimizing wt on LLE while transferring        General Comments General comments (skin integrity, edema, etc.): pt with small amount of drainage through shrinker. Discussed that he should not be ambulating distances much further than during this session and the need to elevate LLE once sitting.    Exercises     Assessment/Plan    PT Assessment Patient needs continued PT services  PT Problem List Decreased mobility;Decreased skin integrity;Decreased safety awareness;Decreased knowledge of precautions;Decreased knowledge of use of DME;Decreased balance;Decreased activity tolerance       PT Treatment Interventions DME instruction;Gait training;Functional mobility training;Therapeutic activities;Therapeutic  exercise;Balance training;Patient/family education    PT Goals (Current goals can be found in the Care Plan section)  Acute Rehab PT Goals Patient Stated Goal: return home, be able to work PT Goal Formulation: With patient Time For Goal Achievement: 11/13/22 Potential to Achieve Goals: Good    Frequency Min 3X/week     Co-evaluation               AM-PAC PT "6 Clicks" Mobility  Outcome Measure Help needed turning from your back to your side while in a flat bed without using bedrails?: None Help needed moving from lying on your back to sitting on the side of a flat bed without using bedrails?: None Help needed moving to and from a bed to a chair (including a wheelchair)?: A Little Help needed standing up from a chair using your arms (e.g., wheelchair or bedside chair)?: A Little Help needed to walk in hospital room?: A Little Help needed climbing 3-5 steps with a railing? : A Little 6 Click Score: 20    End of Session Equipment Utilized During Treatment: Gait belt Activity Tolerance: Patient tolerated treatment well Patient left: in chair;with call bell/phone within reach;with chair alarm set Nurse Communication: Mobility status PT Visit Diagnosis: Unsteadiness on feet (R26.81);Difficulty in walking, not elsewhere classified (R26.2)  Time: 4098-1191 PT Time Calculation (min) (ACUTE ONLY): 42 min   Charges:   PT Evaluation $PT Eval Moderate Complexity: 1 Mod PT Treatments $Gait Training: 8-22 mins $Therapeutic Activity: 8-22 mins PT General Charges $$ ACUTE PT VISIT: 1 Visit         Lyanne Co, PT  Acute Rehab Services Secure chat preferred Office [phone]   Lawana Chambers Jimma Ortman 10/30/2022, 12:03 PM

## 2022-10-30 NOTE — TOC CAGE-AID Note (Signed)
Transition of Care [facility]) - CAGE-AID Screening   Patient Details  Name: Thomas [name] MRN: [id number] Date of Birth: 31-May-1950  Transition of Care [facility]) CM/SW Contact:    Kermit Balo, RN Phone Number: 10/30/2022, 2:13 PM   Clinical Narrative:  Pt refused need for inpatient/ outpatient drug counseling. He states he is on a taper that his outpt MD is working on to get him off pain medications. He denies he has been getting pain medications off the street.   CAGE-AID Screening:    Have You Ever Felt You Ought to Cut Down on Your Drinking or Drug Use?: No Have People Annoyed You By Critizing Your Drinking Or Drug Use?: No Have You Felt Bad Or Guilty About Your Drinking Or Drug Use?: No Have You Ever Had a Drink or Used Drugs First Thing In The Morning to Steady Your Nerves or to Get Rid of a Hangover?: No CAGE-AID Score: 0  Substance Abuse Education Offered: Yes (refused)

## 2022-10-31 ENCOUNTER — Other Ambulatory Visit (HOSPITAL_COMMUNITY): Payer: Self-pay

## 2022-10-31 ENCOUNTER — Encounter: Payer: Medicare Other | Admitting: Orthopedic Surgery

## 2022-10-31 DIAGNOSIS — R4182 Altered mental status, unspecified: Secondary | ICD-10-CM | POA: Diagnosis not present

## 2022-10-31 LAB — CBC
HCT: 22.7 % — ABNORMAL LOW (ref 39.0–52.0)
Hemoglobin: 6.8 g/dL — CL (ref 13.0–17.0)
MCH: 23.2 pg — ABNORMAL LOW (ref 26.0–34.0)
MCHC: 30 g/dL (ref 30.0–36.0)
MCV: 77.5 fL — ABNORMAL LOW (ref 80.0–100.0)
Platelets: 183 10*3/uL (ref 150–400)
RBC: 2.93 MIL/uL — ABNORMAL LOW (ref 4.22–5.81)
RDW: 15.4 % (ref 11.5–15.5)
WBC: 3.4 10*3/uL — ABNORMAL LOW (ref 4.0–10.5)
nRBC: 0 % (ref 0.0–0.2)

## 2022-10-31 LAB — BASIC METABOLIC PANEL
Anion gap: 8 (ref 5–15)
BUN: 8 mg/dL (ref 8–23)
CO2: 25 mmol/L (ref 22–32)
Calcium: 7.9 mg/dL — ABNORMAL LOW (ref 8.9–10.3)
Chloride: 107 mmol/L (ref 98–111)
Creatinine, Ser: 0.85 mg/dL (ref 0.61–1.24)
GFR, Estimated: >60 mL/min (ref >60)
Glucose, Bld: 176 mg/dL — ABNORMAL HIGH (ref 70–99)
Potassium: 3.2 mmol/L — ABNORMAL LOW (ref 3.5–5.1)
Sodium: 140 mmol/L (ref 135–145)

## 2022-10-31 LAB — CULTURE, BLOOD (ROUTINE X 2): Special Requests: ADEQUATE

## 2022-10-31 LAB — HEMOGLOBIN AND HEMATOCRIT, BLOOD
HCT: 24.7 % — ABNORMAL LOW (ref 39.0–52.0)
Hemoglobin: 7.4 g/dL — ABNORMAL LOW (ref 13.0–17.0)

## 2022-10-31 MED ORDER — DOXYCYCLINE HYCLATE 100 MG PO TABS
100.0000 mg | ORAL_TABLET | Freq: Two times a day (BID) | ORAL | 0 refills | Status: AC
  Filled 2022-10-31: qty 14, 7d supply, fill #0

## 2022-10-31 MED ORDER — APIXABAN 5 MG PO TABS
ORAL_TABLET | ORAL | 1 refills | Status: DC
Start: 2022-10-31 — End: 2023-02-19

## 2022-10-31 MED ORDER — ACETAMINOPHEN 325 MG PO TABS: 650.0000 mg | ORAL_TABLET | Freq: Four times a day (QID) | ORAL | Status: DC

## 2022-10-31 MED ORDER — METOPROLOL SUCCINATE ER 25 MG PO TB24: 12.5000 mg | ORAL_TABLET | Freq: Every day | ORAL | Status: DC

## 2022-10-31 MED ORDER — POTASSIUM CHLORIDE CRYS ER 20 MEQ PO TBCR
20.0000 meq | EXTENDED_RELEASE_TABLET | Freq: Once | ORAL | Status: AC
  Administered 2022-10-31: 20 meq via ORAL
  Filled 2022-10-31: qty 1

## 2022-10-31 MED ORDER — SODIUM CHLORIDE 0.9 % IV SOLN
250.0000 mg | Freq: Once | INTRAVENOUS | Status: AC
  Administered 2022-10-31: 250 mg via INTRAVENOUS
  Filled 2022-10-31: qty 20

## 2022-10-31 MED ORDER — OXYCODONE HCL 20 MG PO TABS: 20.0000 mg | ORAL_TABLET | Freq: Four times a day (QID) | ORAL | Status: AC | PRN

## 2022-10-31 MED ORDER — DOXYCYCLINE HYCLATE 50 MG PO CAPS
50.0000 mg | ORAL_CAPSULE | Freq: Two times a day (BID) | ORAL | 0 refills | Status: DC
  Filled 2022-10-31: qty 14, 7d supply, fill #0

## 2022-10-31 MED ORDER — POTASSIUM CHLORIDE CRYS ER 20 MEQ PO TBCR
40.0000 meq | EXTENDED_RELEASE_TABLET | Freq: Once | ORAL | Status: AC
  Administered 2022-10-31: 40 meq via ORAL
  Filled 2022-10-31: qty 2

## 2022-10-31 MED ORDER — LISINOPRIL 10 MG PO TABS: ORAL_TABLET | ORAL | 3 refills | Status: DC

## 2022-10-31 NOTE — Discharge Summary (Cosign Needed Addendum)
Family Medicine Teaching [facility] Discharge Summary  Patient name: Thomas [name] Medical record number: [id number] Date of birth: May 01, 1950 Age: 72 y.o. Gender: male Date of Admission: 10/28/2022  Date of Discharge: 10/31/2022 Admitting Physician: Shelby Mattocks, DO  Primary Care Provider: Carney Living, MD Consultants: Orthopedics  Indication for Hospitalization: altered mental status  Discharge Diagnoses/Problem List:  Principal Problem for Admission: Altered mental status Other Problems addressed during stay:  Principal Problem:   Altered mental status Active Problems:   Malignant neoplasm of prostate (HCC)   Dehiscence of amputation stump of left lower extremity (HCC)   Anemia   Common bile duct dilatation   Hypokalemia    Brief Hospital Course:  Thomas [name] is a 72 year old male presenting with altered mental status.  Admitted to the family medicine teaching service for further workup.  Hospital course is as follows:  Altered mental status Patient did initially present febrile, however this resolved and did not recur during hospitalization.  Patient initially presented with lactic acidosis (2.9) but this resolved after patient received 1 L fluid bolus. Brain MRI was negative.  TSH WNL.  RPR and HIV nonreactive.  UA WNL.  UDS positive for benzodiazepines and opiates, both of which are prescribed to this patient. Decreased patient's oxycodone which improved patients his mentation. Ultimately we felt that his presentation was secondary to medication effects. Re-iterated with the patient and his wife the importance of weaning down off of these controlled substances as he ages.   Dehiscence of LLE amputation stump Initially thought to possibly be an infectious source. Seen by Dr. Lajoyce Corners who recommended a stump shrinker and outpatient follow-up. Patient received vancomycin in hospital. Patient discharged with oral course of doxycycline and instructions to  follow up with Dr. Lajoyce Corners in one week.  Anemia Hemoglobin 8.0 on admission.  His baseline appears to be around 11. Thought likely d/t malnutrition and iron defiency anemia. However, cannot rule out a GI malignancy as he has never had a colonoscopy and had incidental finding of inguinal lymphadenopathy on imaging. Patient received an iron transfusion prior to discharge.  No obvious source of active bleeding was noted.   Follow-up recommendations: 1. Patient needs a colonoscopy.  2. He will need ongoing follow-up with Dr. Lajoyce Corners for his foot wound 3. Continue to urge de-prescribing of his opioids and benzos  4.  His Eliquis was held upon discharge. Defer to his cardiologist on whether or not this should be re-started.   Disposition: Home  Discharge Condition: Stable  Issues for Follow Up:   Discharge Exam:  Vitals:   10/31/22 1324 10/31/22 1528  BP: 112/75 107/75  Pulse: 69 61  Resp: 19 19  Temp: 98.2 F (36.8 C) 98.3 F (36.8 C)  SpO2: 100% 98%   Physical Exam Constitutional:      Appearance: Normal appearance.  HENT:     Head: Normocephalic and atraumatic.  Eyes:     Extraocular Movements: Extraocular movements intact.     Pupils: Pupils are equal, round, and reactive to light.  Cardiovascular:     Rate and Rhythm: Normal rate and regular rhythm.     Pulses: Normal pulses.     Heart sounds: Normal heart sounds.  Pulmonary:     Effort: Pulmonary effort is normal.     Breath sounds: Normal breath sounds.  Abdominal:     General: Bowel sounds are normal.  Neurological:     General: No focal deficit present.     Mental  Status: He is alert and oriented to person, place, and time.  Psychiatric:        Mood and Affect: Mood normal.        Behavior: Behavior normal.      Significant Procedures: n/a  Significant Labs and Imaging:  Recent Labs  Lab 10/30/22 0620 10/31/22 0220 10/31/22 0625  WBC 3.2* 3.4*  --   HGB 7.4* 6.8* 7.4*  HCT 24.5* 22.7* 24.7*  PLT 177 183   --    Recent Labs  Lab 10/30/22 0620 10/31/22 0220  NA 137 140  K 3.3* 3.2*  CL 104 107  CO2 24 25  GLUCOSE 83 176*  BUN 7* 8  CREATININE 0.90 0.85  CALCIUM 8.0* 7.9*  ALKPHOS 55  --   AST 24  --   ALT 15  --   ALBUMIN 2.6*  --      Pertinent Imaging    Results/Tests Pending at Time of Discharge: N/A  Discharge Medications:  Allergies as of 10/31/2022       Reactions   Cefadroxil Hives, Palpitations   EMS to ED from UC.   Diltiazem Hcl Swelling   Chlorthalidone Other (See Comments)   "Makes me light-headed and I don't like the way it makes me feel"   Voltaren [diclofenac] Rash        Medication List     STOP taking these medications    sulfamethoxazole-trimethoprim 800-160 MG tablet Commonly known as: BACTRIM DS   torsemide 20 MG tablet Commonly known as: DEMADEX       TAKE these medications    acetaminophen 325 MG tablet Commonly known as: TYLENOL Take 2 tablets (650 mg total) by mouth every 6 (six) hours.   amiodarone 100 MG tablet Commonly known as: PACERONE Take 1 tablet (100 mg total) by mouth daily.   apixaban 5 MG Tabs tablet Commonly known as: ELIQUIS Stop taking your Eliquis UNTIL you follow-up with your cardiologist What changed:  how much to take how to take this when to take this additional instructions   diazepam 5 MG tablet Commonly known as: VALIUM 5 mg at bedtime as needed for muscle spasms (To relax prostate).   doxycycline 100 MG tablet Commonly known as: VIBRA-TABS Take 1 tablet (100 mg total) by mouth 2 (two) times daily for 7 days.   DULoxetine 60 MG capsule Commonly known as: CYMBALTA TAKE 1 CAPSULE BY MOUTH EVERY DAY - What changed: See the new instructions.   lisinopril 10 MG tablet Commonly known as: ZESTRIL STOP taking until you see your cardiologist What changed:  how much to take how to take this when to take this additional instructions   metoprolol succinate 25 MG 24 hr tablet Commonly known as:  Toprol XL Take 0.5 tablets (12.5 mg total) by mouth daily. Start taking on: November 01, 2022   Oxycodone HCl 20 MG Tabs Take 1 tablet (20 mg total) by mouth every 6 (six) hours as needed for pain. What changed:  medication strength how much to take when to take this Another medication with the same name was removed. Continue taking this medication, and follow the directions you see here.   silver sulfADIAZINE 1 % cream Commonly known as: SILVADENE APPLY 1 APPLICATION TOPICALLY DAILY   tamsulosin 0.4 MG Caps capsule Commonly known as: FLOMAX Take 0.4 mg by mouth daily as needed (prostate spasms).   testosterone cypionate 200 MG/ML injection Commonly known as: DEPOTESTOSTERONE CYPIONATE Inject 100 mg into the muscle once a week.  Discharge Instructions: Please refer to Patient Instructions section of EMR for full details.  Patient was counseled important signs and symptoms that should prompt return to medical care, changes in medications, dietary instructions, activity restrictions, and follow up appointments.   Follow-Up Appointments:  Follow-up Information     Nadara Mustard, MD Follow up in 1 week(s).   Specialty: Orthopedic Surgery Contact information: [address] [place] [address] [phone]         Carney Living, MD Follow up on 11/14/2022.   Specialty: Family Medicine Why: You have an appointment with Dr. Deirdre Priest at 9:30am, please arrive 15 minutes early. Contact information: [address] [place] [address] [phone]                 Cyd Silence, MD 10/31/2022, 5:38 PM PGY-1, [facility] Family [facility]     I have evaluated this patient along with Dr. Georg Ruddle and reviewed the above note, making necessary revisions.  Dorothyann Gibbs, MD 10/31/2022, 6:29 PM PGY-3, [facility]

## 2022-10-31 NOTE — Progress Notes (Signed)
   10/31/22 0700  Non-violent Restraints  Soft Restraint Right Wrist Discontinued  Soft Restraint Left Wrist Discontinued  Soft Waist Belt Discontinued  Safety Interventions  Less Restrictive Interventions Active listening;Bed alarm;Chair alarm;Encourage family presence;Pain/Anxiety management;Place patient near nursing station;Provide reassurance;Re-evaluate equipment (appropriately applied);Reorient patient;Repositioning;Other (Comment) (no restraints in place at this time)    No restraints in place.  Order was already discontinued.  I notified Faith Rogue, RN of error in charting on prior shifts.

## 2022-10-31 NOTE — Care Management Important Message (Signed)
Important Message  Patient Details  Name: Thomas [name] MRN: [id number] Date of Birth: 03/27/1951   Medicare Important Message Given:  Yes     Jezabel Lecker Stefan Church 10/31/2022, 1:53 PM

## 2022-10-31 NOTE — Progress Notes (Signed)
Physical Therapy Treatment Patient Details Name: Thomas [name] MRN: [id number] DOB: [birth year] Today's Date: 10/31/2022   History of Present Illness Thomas [name] III is a 72yo male who presented with altered mental status. PMHx: HFrEF, AFib, RA, HTN, Depression/Anxiety, and multiple toe amputations 2/2 osteomyelitis,ORIF of right intertrochanteric femur fracture (2022)    PT Comments  Pt continues to be easily irritated, impulsive, and with noted decreased insight to safety and deficits. Pt doesn't adhere to instruction to minimize L LE Wbing at this time. Due to patients cognitive impairment pt will continue to mobilize "his way". I don't feel pt will benefit from follow up PT upon d/c. Acute PT to cont to monitor patient while in hospital.     Assistance Recommended at Discharge Intermittent Supervision/Assistance  If plan is discharge home, recommend the following:  Can travel by private vehicle    A little help with walking and/or transfers;A little help with bathing/dressing/bathroom;Assistance with cooking/housework;Assist for transportation      Equipment Recommendations  Wheelchair (measurements PT);Wheelchair cushion (measurements PT) (if pt progresses to NWB)    Recommendations for Other Services       Precautions / Restrictions Precautions Precautions: Fall Required Braces or Orthoses: Other Brace (bilat post op shoes) Other Brace: Dr. Lajoyce Corners wants shrinker on 24/7 on L foot Restrictions Weight Bearing Restrictions: Yes RLE Weight Bearing: Weight bearing as tolerated LLE Weight Bearing: Weight bearing as tolerated Other Position/Activity Restrictions: no strict WB precautions but Dr Lajoyce Corners wrote to "minimize L LE WB'ing" in his note     Mobility  Bed Mobility Overal bed mobility: Modified Independent             General bed mobility comments: HOB elevated    Transfers Overall transfer level: Needs assistance Equipment used: Rolling walker (2  wheels) Transfers: Sit to/from Stand Sit to Stand: Min guard           General transfer comment: STS transfer from EOB and room level mobility using RW with min guard A and min verbal cues for hand placement    Ambulation/Gait Ambulation/Gait assistance: Min guard Gait Distance (Feet): 100 Feet Assistive device: Rolling walker (2 wheels) Gait Pattern/deviations: Step-through pattern, Decreased stride length Gait velocity: decreased Gait velocity interpretation: 1.31 - 2.62 ft/sec, indicative of limited community ambulator   General Gait Details: vc's for proximity to RW as well as education on why he needs to use RW currently instead of his preferred cane. vc's for offsetting wt from LLE onto UE's when stepping RLE. vc's for safety.   Stairs             Wheelchair Mobility     Tilt Bed    Modified Rankin (Stroke Patients Only)       Balance Overall balance assessment: Mild deficits observed, not formally tested                                          Cognition Arousal/Alertness: Awake/alert Behavior During Therapy: Impulsive Overall Cognitive Status: Impaired/Different from baseline Area of Impairment: Memory, Safety/judgement                     Memory: Decreased short-term memory (asking PT multiple times "What do you want me to do?")   Safety/Judgement: Decreased awareness of safety, Decreased awareness of deficits   Problem Solving: Requires verbal cues General Comments: pt impulsive, denies being  instructed to minimize L LE WBing, pt attempting to get up with shrinkers only on, pt putting on tshirt over IV, pt wanting to sit in small armed chair vs recliner        Exercises      General Comments General comments (skin integrity, edema, etc.): VSS on RA      Pertinent Vitals/Pain Pain Assessment Pain Assessment: No/denies pain    Home Living                          Prior Function            PT  Goals (current goals can now be found in the care plan section) Acute Rehab PT Goals Patient Stated Goal: return home, be able to work PT Goal Formulation: With patient Time For Goal Achievement: 11/13/22 Potential to Achieve Goals: Good Progress towards PT goals: Progressing toward goals    Frequency    Min 3X/week      PT Plan Current plan remains appropriate    Co-evaluation              AM-PAC PT "6 Clicks" Mobility   Outcome Measure  Help needed turning from your back to your side while in a flat bed without using bedrails?: None Help needed moving from lying on your back to sitting on the side of a flat bed without using bedrails?: None Help needed moving to and from a bed to a chair (including a wheelchair)?: A Little Help needed standing up from a chair using your arms (e.g., wheelchair or bedside chair)?: A Lot Help needed to walk in hospital room?: A Lot Help needed climbing 3-5 steps with a railing? : A Lot 6 Click Score: 17    End of Session Equipment Utilized During Treatment: Gait belt Activity Tolerance: Patient tolerated treatment well Patient left: in chair;with call bell/phone within reach;with chair alarm set Nurse Communication: Mobility status PT Visit Diagnosis: Unsteadiness on feet (R26.81);Difficulty in walking, not elsewhere classified (R26.2)     Time: 5956-3875 PT Time Calculation (min) (ACUTE ONLY): 20 min  Charges:    $Gait Training: 8-22 mins PT General Charges $$ ACUTE PT VISIT: 1 Visit                     Lewis Shock, PT, DPT Acute Rehabilitation Services Secure chat preferred Office #: [phone]    Iona Hansen 10/31/2022, 1:44 PM

## 2022-10-31 NOTE — Discharge Instructions (Addendum)
Dear Thomas [name],  Thank you for letting us participate in your care. You were hospitalized for altered mental status and dehiscence of left transmetatarsal amputation and diagnosed with Altered mental status. You were treated with a change in your pain medication as well as a change in antibiotics for your amputation.   Your wife should manage all of your medications for you--especially your narcotics.  You should also bring in the 30 mg oxycodone pills to the clinic for [place] to dispose of since we are reducing the amount of opioids you are taking.   We have held several of your medications due to low blood pressures. Please review your medications carefully.   POST-HOSPITAL & CARE INSTRUCTIONS Please review medications carefully and discuss changes with your PCP Continue to take prescribed doxycycline for 7 days as prescribed  Please follow up with your cardiologist regarding restarting your medications Go to your follow up appointments (listed below)   DOCTOR'S APPOINTMENT   Future Appointments  Date Time Provider Department Center  11/14/2022  9:30 AM Carney Living, MD [facility]  01/17/2023 10:20 AM Meriam Sprague, MD [facility]    Follow-up Information     Nadara Mustard, MD Follow up in 1 week(s).   Specialty: Orthopedic Surgery Contact information: [address] [place] [address] [phone]         Carney Living, MD Follow up on 11/14/2022.   Specialty: Family Medicine Why: You have an appointment with Dr. Deirdre Priest at 9:30am, please arrive 15 minutes early. Contact information: [address] [place] [address] [phone]                 Take care and be well!  Family Medicine Teaching Service Inpatient Team [place]  [facility]  [address] [place], [place] [address] [phone]

## 2022-10-31 NOTE — TOC Transition Note (Addendum)
Transition of Care [facility]) - CM/SW Discharge Note   Patient Details  Name: Thomas [name] MRN: [id number] Date of Birth: 07-May-1950  Transition of Care [facility]) CM/SW Contact:  Kermit Balo, RN Phone Number: 10/31/2022, 3:43 PM   Clinical Narrative:    Pt is discharging home with self care. Wife providing transportation home. SDOH Interventions Today    Flowsheet Row Most Recent Value  SDOH Interventions   Utilities Interventions Inpatient TOC      Pt provided with [employer] crisis packet for assistance with bills at home.   Final next level of care: Home/Self Care Barriers to Discharge: No Barriers Identified   Patient Goals and CMS Choice      Discharge Placement                         Discharge Plan and Services Additional resources added to the After Visit Summary for     Discharge Planning Services: CM Consult                                 Social Determinants of Health (SDOH) Interventions SDOH Screenings   Food Insecurity: No Food Insecurity (10/30/2022)  Housing: Medium Risk (10/30/2022)  Transportation Needs: No Transportation Needs (10/30/2022)  Utilities: At Risk (10/30/2022)  Depression (PHQ2-9): High Risk (06/20/2022)  Tobacco Use: Low Risk  (10/28/2022)     Readmission Risk Interventions     No data to display

## 2022-11-01 ENCOUNTER — Telehealth: Payer: Self-pay

## 2022-11-01 LAB — CULTURE, BLOOD (ROUTINE X 2): Culture: NO GROWTH

## 2022-11-01 NOTE — Transitions of Care (Post Inpatient/ED Visit) (Signed)
11/01/2022  Name: Thomas [name] MRN: [id number] DOB: [birth year]  Today's TOC FU Call Status: Today's TOC FU Call Status:: Successful TOC FU Call Competed TOC FU Call Complete Date: 11/01/22  Transition Care Management Follow-up Telephone Call Date of Discharge: 10/31/22 Discharge Facility: [facility]) Type of Discharge: Inpatient Admission Primary Inpatient Discharge Diagnosis:: Altered Mental Status How have you been since you were released from the hospital?: Same (Patient noted he is in pain and cannot pick up his Oxycodone before Friday due to he recently had prescription filled.) Any questions or concerns?: No  Items Reviewed: Did you receive and understand the discharge instructions provided?: Yes Medications obtained,verified, and reconciled?: Yes (Medications Reviewed) Any new allergies since your discharge?: No Dietary orders reviewed?: No Do you have support at home?: Yes People in Home: spouse Name of Support/Comfort Primary Source: Robin  Medications Reviewed Today: Medications Reviewed Today     Reviewed by Jodelle Gross, RN (Case Manager) on 11/01/22 at 1525  Med List Status: <None>   Medication Order Taking? Sig Documenting Provider Last Dose Status Informant  acetaminophen (TYLENOL) 325 MG tablet [id number] Yes Take 2 tablets (650 mg total) by mouth every 6 (six) hours. Alicia Amel, MD Taking Active   amiodarone (PACERONE) 100 MG tablet [id number] Yes Take 1 tablet (100 mg total) by mouth daily. Meriam Sprague, MD Taking Active Self, Pharmacy Records  apixaban [facility]) 5 MG TABS tablet [id number] Yes Stop taking your Eliquis UNTIL you follow-up with your cardiologist Alicia Amel, MD Taking Active   diazepam (VALIUM) 5 MG tablet [id number] Yes 5 mg at bedtime as needed for muscle spasms (To relax prostate). [provider] Taking Active Self, Pharmacy Records  doxycycline (VIBRA-TABS) 100 MG tablet [id number] Yes Take 1 tablet (100  mg total) by mouth 2 (two) times daily for 7 days. Carney Living, MD Taking Active   DULoxetine (CYMBALTA) 60 MG capsule [id number] Yes TAKE 1 CAPSULE BY MOUTH EVERY DAY -  Patient taking differently: Take 60 mg by mouth daily.   Carney Living, MD Taking Active Self, Pharmacy Records  lisinopril (ZESTRIL) 10 MG tablet [id number] Yes STOP taking until you see your cardiologist Alicia Amel, MD Taking Active   metoprolol succinate (TOPROL XL) 25 MG 24 hr tablet [id number] Yes Take 0.5 tablets (12.5 mg total) by mouth daily. Alicia Amel, MD Taking Active   oxycodone 20 MG TABS [id number]  Take 1 tablet (20 mg total) by mouth every 6 (six) hours as needed for pain. Alicia Amel, MD  Active            Med Note Electa Sniff, [facility]   Wed Nov 01, 2022  3:25 PM) Patient does not have any left.  Can pick up new prescription on 11/03/22  silver sulfADIAZINE (SILVADENE) 1 % cream [id number]  APPLY 1 APPLICATION TOPICALLY DAILY Nadara Mustard, MD  Active Self, Pharmacy Records  tamsulosin [facility]) 0.4 MG CAPS capsule [id number] Yes Take 0.4 mg by mouth daily as needed (prostate spasms). [provider] Taking Active Self, Pharmacy Records  testosterone cypionate (DEPOTESTOSTERONE CYPIONATE) 200 MG/ML injection [id number]  Inject 100 mg into the muscle once a week. [provider]  Active Self, Pharmacy Records            Home Care and Equipment/Supplies: Were Home Health Services Ordered?: No Any new equipment or medical supplies ordered?: No  Functional Questionnaire: Do you need assistance with bathing/showering or dressing?: No Do you need  assistance with meal preparation?: No Do you need assistance with eating?: No Do you have difficulty maintaining continence: No Do you need assistance with getting out of bed/getting out of a chair/moving?: No Do you have difficulty managing or taking your medications?: No  Follow up appointments reviewed: PCP Follow-up  appointment confirmed?: Yes Date of PCP follow-up appointment?: 11/14/22 Follow-up Provider: Dr. Deirdre Priest Specialist [facility] Follow-up appointment confirmed?: Yes Date of Specialist follow-up appointment?: 11/07/22 Follow-Up Specialty Provider:: Dr. Lajoyce Corners Do you need transportation to your follow-up appointment?: No Do you understand care options if your condition(s) worsen?: Yes-patient verbalized understanding  Jodelle Gross, RN, BSN, CCM Care Management Coordinator [facility] Phone: [phone]/Fax: [phone]

## 2022-11-02 LAB — CULTURE, BLOOD (ROUTINE X 2)

## 2022-11-08 ENCOUNTER — Encounter: Payer: Medicare Other | Admitting: Family

## 2022-11-14 ENCOUNTER — Ambulatory Visit (INDEPENDENT_AMBULATORY_CARE_PROVIDER_SITE_OTHER): Payer: Medicare Other | Admitting: Family

## 2022-11-14 ENCOUNTER — Ambulatory Visit: Payer: Medicare Other | Admitting: Family Medicine

## 2022-11-14 ENCOUNTER — Encounter: Payer: Self-pay | Admitting: Family

## 2022-11-14 DIAGNOSIS — T8781 Dehiscence of amputation stump: Secondary | ICD-10-CM

## 2022-11-14 NOTE — Progress Notes (Signed)
Post-Op Visit Note   Patient: Thomas [name]           Date of Birth: 1950/11/06           MRN: [id number] Visit Date: 11/14/2022 PCP: Carney Living, MD  Chief Complaint:  Chief Complaint  Patient presents with   Left Foot - Routine Post Op    09/15/2022 revision left transmet amputation     HPI:  HPI  the patient is a 72 year old gentleman who presents in postoperative follow-up status post left transmetatarsal amputation revision May 24 has been doing daily Dial soap cleansing wearing a shrinker with direct skin contact Ortho Exam On examination of the left foot the transmetatarsal amputation is well-healed medially laterally there is an area that is open about 2.5 cm x 1 cm filled in with scant fibrinous tissue there is serous drainage no erythema warmth no sign of infection remaining sutures harvested today without incident  Visit Diagnoses: No diagnosis found.  Plan: Continue daily dose of cleansing shrinker with direct skin contact minimize weightbearing elevate for swelling.  He will follow-up in 3 weeks.  Follow-Up Instructions: Return in about 3 weeks (around 12/05/2022).   Imaging: No results found.  Orders:  No orders of the defined types were placed in this encounter.  No orders of the defined types were placed in this encounter.    PMFS History: Patient Active Problem List   Diagnosis Date Noted   Hypokalemia 10/30/2022   Common bile duct dilatation 10/29/2022   Altered mental status 10/28/2022   Anemia 10/28/2022   Wound dehiscence, surgical 09/19/2022   Depressed mood 06/20/2022   Dehiscence of amputation stump of left lower extremity (HCC) 03/10/2022   History of transmetatarsal amputation of right foot (HCC) 02/15/2022   Chronic osteomyelitis of toe, right (HCC) 02/10/2022   Fracture of intertrochanteric section of femur, closed, right, with nonunion, subsequent encounter 12/24/2020   Inguinal hernia 10/18/2020   Chronic combined  systolic and diastolic CHF, NYHA class 2 (HCC)    Osteoporosis 05/05/2020   Foot infection    NICM (nonischemic cardiomyopathy) (HCC), no significant CAD on cardiac cath   02/12/2020   Right foot pain    Rheumatoid arthritis (HCC)    Atrial flutter (HCC) 02/02/2020   Lower extremity edema 12/30/2019   Weight loss, non-intentional 11/24/2019   Chronic right hip pain 06/19/2019   Degenerative tear of acetabular labrum of right hip 05/02/2019   Primary osteoarthritis of right hip 05/02/2019   Skin ulcer (HCC) 01/29/2019   Malignant neoplasm of prostate (HCC) 01/16/2018   Insomnia 10/02/2017   S/P bilateral TKA 09/13/2015   Other bilateral secondary osteoarthritis of knee 07/14/2014   Heme positive stool 05/21/2013   Osteoarthritis, multiple sites 01/13/2011   Hypertension 12/06/2010   CLAUSTROPHOBIA 03/01/2010   Past Medical History:  Diagnosis Date   Anemia    Atrial flutter (HCC) 01/2020   Cancer (HCC)    prostate   COVID 2021   mild case   Dysrhythmia    Aflutter   Heart failure with reduced ejection fraction (HCC)    Pt denies, it is in cardiologist notes.   Heart murmur    Hypertension    Hypoglycemia    occ   NICM (nonischemic cardiomyopathy) (HCC) 02/12/2020   Osteomyelitis of third toe of left foot (HCC) 08/16/2022   Prostate cancer (HCC) 2019   Rheumatoid arthritis (HCC)     Family History  Problem Relation Age of Onset   Prostate  cancer Father    Prostate cancer Brother    Colon cancer Neg Hx    Rectal cancer Neg Hx    Stomach cancer Neg Hx     Past Surgical History:  Procedure Laterality Date   AMPUTATION Right 02/10/2022   Procedure: RIGHT TRANSMETATARSAL AMPUTATION;  Surgeon: Nadara Mustard, MD;  Location: [facility] OR;  Service: Orthopedics;  Laterality: Right;   AMPUTATION Right 03/10/2022   Procedure: RIGHT FOOT CHOPART AMPUTATION;  Surgeon: Nadara Mustard, MD;  Location: [facility] OR;  Service: Orthopedics;  Laterality: Right;   AMPUTATION Left 08/16/2022    Procedure: LEFT 3RD TOE AMPUTATION;  Surgeon: Nadara Mustard, MD;  Location: [facility] OR;  Service: Orthopedics;  Laterality: Left;   AMPUTATION Left 09/13/2022   Procedure: LEFT TRANSMETATARSAL AMPUTATION;  Surgeon: Nadara Mustard, MD;  Location: [facility] OR;  Service: Orthopedics;  Laterality: Left;   AMPUTATION TOE Right 03/26/2020   Procedure: Right 3rd toe partial amputation, bone biopsy right 1st metatarsal;  Surgeon: Park Liter, DPM;  Location: [facility] ORS;  Service: Podiatry;  Laterality: Right;   BUBBLE STUDY  02/11/2020   Procedure: BUBBLE STUDY;  Surgeon: Sande Rives, MD;  Location: [facility] ENDOSCOPY;  Service: Cardiovascular;;   CARDIOVERSION N/A 02/11/2020   Procedure: CARDIOVERSION;  Surgeon: Sande Rives, MD;  Location: [facility] ENDOSCOPY;  Service: Cardiovascular;  Laterality: N/A;   CARDIOVERSION N/A 08/31/2021   Procedure: CARDIOVERSION;  Surgeon: Jake Bathe, MD;  Location: [facility] ENDOSCOPY;  Service: Cardiovascular;  Laterality: N/A;   CYSTOSCOPY  04/11/2018   Procedure: Derinda Late;  Surgeon: Crist Fat, MD;  Location: [facility];  Service: Urology;;  NO SEEDS FOUND IN BLADDER   HERNIA REPAIR  2009   inguinal   INTRAMEDULLARY (IM) NAIL INTERTROCHANTERIC Right 05/05/2020   Procedure: CPT 27245-Cephalomedullary nailing of right intertrochanteric femur fracture;  Surgeon: Roby Lofts, MD;  Location: [facility] OR;  Service: Orthopedics;  Laterality: Right;   INTRAMEDULLARY (IM) NAIL INTERTROCHANTERIC Right 12/24/2020   Procedure: REVISION FIXATION OF RIGHT INTERTROCHANTERIC FEMUR NONUNION;  Surgeon: Roby Lofts, MD;  Location: [facility] OR;  Service: Orthopedics;  Laterality: Right;   IRRIGATION AND DEBRIDEMENT FOOT Left 03/26/2020   Procedure: Left foot incision and drainage with removal of all non-viable soft tissue and bone - areas overlying the 2nd and 5th metatarsals.;  Surgeon: Park Liter, DPM;  Location: [facility] ORS;  Service: Podiatry;  Laterality:  Left;   RADIOACTIVE SEED IMPLANT N/A 04/11/2018   Procedure: RADIOACTIVE SEED IMPLANT/BRACHYTHERAPY IMPLANT;  Surgeon: Crist Fat, MD;  Location: [facility];  Service: Urology;  Laterality: N/A;   71     SEEDS IMPLANTED   RIGHT/LEFT HEART CATH AND CORONARY ANGIOGRAPHY N/A 02/09/2020   Procedure: RIGHT/LEFT HEART CATH AND CORONARY ANGIOGRAPHY;  Surgeon: Yvonne Kendall, MD;  Location: [facility] INVASIVE CV LAB;  Service: Cardiovascular;  Laterality: N/A;   SPACE OAR INSTILLATION N/A 04/11/2018   Procedure: SPACE OAR INSTILLATION;  Surgeon: Crist Fat, MD;  Location: [facility];  Service: Urology;  Laterality: N/A;   STUMP REVISION Left 09/15/2022   Procedure: REVISION LEFT TRANSMETATARSAL AMPUTATION;  Surgeon: Nadara Mustard, MD;  Location: [facility] OR;  Service: Orthopedics;  Laterality: Left;   TEE WITHOUT CARDIOVERSION N/A 02/11/2020   Procedure: TRANSESOPHAGEAL ECHOCARDIOGRAM (TEE);  Surgeon: Sande Rives, MD;  Location: [facility] ENDOSCOPY;  Service: Cardiovascular;  Laterality: N/A;   TOTAL KNEE ARTHROPLASTY Bilateral 09/13/2015   Procedure: BILATERAL KNEE ARTHROPLASTY ;  Surgeon: Durene Romans, MD;  Location: [facility] ORS;  Service: Orthopedics;  Laterality: Bilateral;   Social History   Occupational History   Not on file  Tobacco Use   Smoking status: Never   Smokeless tobacco: Never  Vaping Use   Vaping status: Never Used  Substance and Sexual Activity   Alcohol use: Not Currently    Alcohol/week: 7.0 standard drinks of alcohol    Types: 7 Cans of beer per week   Drug use: Not Currently   Sexual activity: Yes

## 2022-11-15 ENCOUNTER — Telehealth: Payer: Self-pay | Admitting: Family

## 2022-11-15 NOTE — Telephone Encounter (Signed)
Patient called and said he had a conversation about something that happened at the hospital and needed to give her information.CB#[phone]

## 2022-11-15 NOTE — Telephone Encounter (Signed)
I called and sw pt and he had questions about the Vive sock and what it was supposed to do for his TMA. Discussed the importance of direct skin contact and wearing every day. That the sock will debrided the wound daily by removing nonviable tissue each time the sock is removed to clean the foot while also providing compression to assist with swelling. Pt voiced understanding all questions were answered and he will call with any questions or concerns.

## 2022-11-24 ENCOUNTER — Ambulatory Visit (INDEPENDENT_AMBULATORY_CARE_PROVIDER_SITE_OTHER): Payer: Medicare Other

## 2022-11-24 VITALS — Ht 79.0 in | Wt 206.0 lb

## 2022-11-24 DIAGNOSIS — Z0000 Encounter for general adult medical examination without abnormal findings: Secondary | ICD-10-CM | POA: Diagnosis not present

## 2022-11-24 NOTE — Patient Instructions (Signed)
Mr. Thomas [name] , Thank you for taking time to come for your Medicare Wellness Visit. I appreciate your ongoing commitment to your health goals. Please review the following plan we discussed and let me know if I can assist you in the future.   Referrals/Orders/Follow-Ups/Clinician Recommendations: Aim for 30 minutes of exercise or brisk walking, 6-8 glasses of water, and 5 servings of fruits and vegetables each day.  This is a list of the screening recommended for you and due dates:  Health Maintenance  Topic Date Due   Yearly kidney health urinalysis for diabetes  Never done   Zoster (Shingles) Vaccine (1 of 2) 11/19/1971   Colon Cancer Screening  Never done   Pneumonia Vaccine (2 of 2 - PPSV23 or PCV20) 03/01/2020   COVID-19 Vaccine (5 - 2023-24 season) 12/23/2021   Flu Shot  11/23/2022   Yearly kidney function blood test for diabetes  10/31/2023   Medicare Annual Wellness Visit  11/24/2023   DTaP/Tdap/Td vaccine (3 - Td or Tdap) 02/16/2029   Hepatitis C Screening  Completed   HPV Vaccine  Aged Out    Advanced directives: (ACP Link)Information on Advanced Care Planning can be found at [facility] of [place] Advance Health Care Directives Advance Health Care Directives ([email or web address]   Next Medicare Annual Wellness Visit scheduled for next year: Yes  Preventive Care 65 Years and Older, Male  Preventive care refers to lifestyle choices and visits with your health care provider that can promote health and wellness. What does preventive care include? A yearly physical exam. This is also called an annual well check. Dental exams once or twice a year. Routine eye exams. Ask your health care provider how often you should have your eyes checked. Personal lifestyle choices, including: Daily care of your teeth and gums. Regular physical activity. Eating a healthy diet. Avoiding tobacco and drug use. Limiting alcohol use. Practicing safe sex. Taking low doses of aspirin every  day. Taking vitamin and mineral supplements as recommended by your health care provider. What happens during an annual well check? The services and screenings done by your health care provider during your annual well check will depend on your age, overall health, lifestyle risk factors, and family history of disease. Counseling  Your health care provider may ask you questions about your: Alcohol use. Tobacco use. Drug use. Emotional well-being. Home and relationship well-being. Sexual activity. Eating habits. History of falls. Memory and ability to understand (cognition). Work and work Astronomer. Screening  You may have the following tests or measurements: Height, weight, and BMI. Blood pressure. Lipid and cholesterol levels. These may be checked every 5 years, or more frequently if you are over 69 years old. Skin check. Lung cancer screening. You may have this screening every year starting at age 79 if you have a 30-pack-year history of smoking and currently smoke or have quit within the past 15 years. Fecal occult blood test (FOBT) of the stool. You may have this test every year starting at age 74. Flexible sigmoidoscopy or colonoscopy. You may have a sigmoidoscopy every 5 years or a colonoscopy every 10 years starting at age 35. Prostate cancer screening. Recommendations will vary depending on your family history and other risks. Hepatitis C blood test. Hepatitis B blood test. Sexually transmitted disease (STD) testing. Diabetes screening. This is done by checking your blood sugar (glucose) after you have not eaten for a while (fasting). You may have this done every 1-3 years. Abdominal aortic aneurysm (AAA) screening. You may need  this if you are a current or former smoker. Osteoporosis. You may be screened starting at age 40 if you are at high risk. Talk with your health care provider about your test results, treatment options, and if necessary, the need for more  tests. Vaccines  Your health care provider may recommend certain vaccines, such as: Influenza vaccine. This is recommended every year. Tetanus, diphtheria, and acellular pertussis (Tdap, Td) vaccine. You may need a Td booster every 10 years. Zoster vaccine. You may need this after age 67. Pneumococcal 13-valent conjugate (PCV13) vaccine. One dose is recommended after age 46. Pneumococcal polysaccharide (PPSV23) vaccine. One dose is recommended after age 87. Talk to your health care provider about which screenings and vaccines you need and how often you need them. This information is not intended to replace advice given to you by your health care provider. Make sure you discuss any questions you have with your health care provider. Document Released: 05/07/2015 Document Revised: 12/29/2015 Document Reviewed: 02/09/2015 Elsevier Interactive Patient Education  2017 [employer].  Fall Prevention in the Home Falls can cause injuries. They can happen to people of all ages. There are many things you can do to make your home safe and to help prevent falls. What can I do on the outside of my home? Regularly fix the edges of walkways and driveways and fix any cracks. Remove anything that might make you trip as you walk through a door, such as a raised step or threshold. Trim any bushes or trees on the path to your home. Use bright outdoor lighting. Clear any walking paths of anything that might make someone trip, such as rocks or tools. Regularly check to see if handrails are loose or broken. Make sure that both sides of any steps have handrails. Any raised decks and porches should have guardrails on the edges. Have any leaves, snow, or ice cleared regularly. Use sand or salt on walking paths during winter. Clean up any spills in your garage right away. This includes oil or grease spills. What can I do in the bathroom? Use night lights. Install grab bars by the toilet and in the tub and shower.  Do not use towel bars as grab bars. Use non-skid mats or decals in the tub or shower. If you need to sit down in the shower, use a plastic, non-slip stool. Keep the floor dry. Clean up any water that spills on the floor as soon as it happens. Remove soap buildup in the tub or shower regularly. Attach bath mats securely with double-sided non-slip rug tape. Do not have throw rugs and other things on the floor that can make you trip. What can I do in the bedroom? Use night lights. Make sure that you have a light by your bed that is easy to reach. Do not use any sheets or blankets that are too big for your bed. They should not hang down onto the floor. Have a firm chair that has side arms. You can use this for support while you get dressed. Do not have throw rugs and other things on the floor that can make you trip. What can I do in the kitchen? Clean up any spills right away. Avoid walking on wet floors. Keep items that you use a lot in easy-to-reach places. If you need to reach something above you, use a strong step stool that has a grab bar. Keep electrical cords out of the way. Do not use floor polish or wax that makes floors  slippery. If you must use wax, use non-skid floor wax. Do not have throw rugs and other things on the floor that can make you trip. What can I do with my stairs? Do not leave any items on the stairs. Make sure that there are handrails on both sides of the stairs and use them. Fix handrails that are broken or loose. Make sure that handrails are as long as the stairways. Check any carpeting to make sure that it is firmly attached to the stairs. Fix any carpet that is loose or worn. Avoid having throw rugs at the top or bottom of the stairs. If you do have throw rugs, attach them to the floor with carpet tape. Make sure that you have a light switch at the top of the stairs and the bottom of the stairs. If you do not have them, ask someone to add them for you. What else  can I do to help prevent falls? Wear shoes that: Do not have high heels. Have rubber bottoms. Are comfortable and fit you well. Are closed at the toe. Do not wear sandals. If you use a stepladder: Make sure that it is fully opened. Do not climb a closed stepladder. Make sure that both sides of the stepladder are locked into place. Ask someone to hold it for you, if possible. Clearly mark and make sure that you can see: Any grab bars or handrails. First and last steps. Where the edge of each step is. Use tools that help you move around (mobility aids) if they are needed. These include: Canes. Walkers. Scooters. Crutches. Turn on the lights when you go into a dark area. Replace any light bulbs as soon as they burn out. Set up your furniture so you have a clear path. Avoid moving your furniture around. If any of your floors are uneven, fix them. If there are any pets around you, be aware of where they are. Review your medicines with your doctor. Some medicines can make you feel dizzy. This can increase your chance of falling. Ask your doctor what other things that you can do to help prevent falls. This information is not intended to replace advice given to you by your health care provider. Make sure you discuss any questions you have with your health care provider. Document Released: 02/04/2009 Document Revised: 09/16/2015 Document Reviewed: 05/15/2014 Elsevier Interactive Patient Education  2017 [employer].

## 2022-11-24 NOTE — Progress Notes (Addendum)
Subjective:   Thomas [name] is a 72 y.o. male who presents for an Initial Medicare Annual Wellness Visit.  Visit Complete: Virtual  I connected with  Thomas [name] on 11/24/22 by a audio enabled telemedicine application and verified that I am speaking with the correct person using two identifiers.  Patient Location: Home  Provider Location: Home Office  I discussed the limitations of evaluation and management by telemedicine. The patient expressed understanding and agreed to proceed.  Vital Signs: Per patient no change in vitals since last visit.  Review of Systems     Cardiac Risk Factors include: advanced age (>49men, >63 women);male gender;hypertension;dyslipidemia     Objective:    Today's Vitals   11/24/22 2139  Weight: 206 lb (93.4 kg)  Height: 6\' 7"  (2.007 m)   Body mass index is 23.21 kg/m.     11/24/2022    9:46 PM 10/30/2022   10:55 AM 09/15/2022   10:21 AM 09/13/2022    6:14 AM 08/16/2022    6:30 AM 06/20/2022    3:21 PM 03/10/2022    9:48 AM  Advanced Directives  Does Patient Have a Medical Advance Directive? No No No No No No No;Unable to assess, patient is non-responsive or altered mental status  Would patient like information on creating a medical advance directive? Yes (MAU/Ambulatory/Procedural Areas - Information given) No - Patient declined No - Patient declined No - Patient declined No - Patient declined No - Patient declined No - Patient declined    Current Medications (verified) Outpatient Encounter Medications as of 11/24/2022  Medication Sig   acetaminophen (TYLENOL) 325 MG tablet Take 2 tablets (650 mg total) by mouth every 6 (six) hours.   diazepam (VALIUM) 5 MG tablet 5 mg at bedtime as needed for muscle spasms (To relax prostate).   DULoxetine (CYMBALTA) 60 MG capsule TAKE 1 CAPSULE BY MOUTH EVERY DAY - (Patient taking differently: Take 60 mg by mouth daily.)   oxycodone (ROXICODONE) 30 MG immediate release tablet Take by mouth.    oxycodone 20 MG TABS Take 1 tablet (20 mg total) by mouth every 6 (six) hours as needed for pain.   silver sulfADIAZINE (SILVADENE) 1 % cream APPLY 1 APPLICATION TOPICALLY DAILY   tamsulosin (FLOMAX) 0.4 MG CAPS capsule Take 0.4 mg by mouth daily as needed (prostate spasms).   testosterone cypionate (DEPOTESTOSTERONE CYPIONATE) 200 MG/ML injection Inject 100 mg into the muscle once a week.   amiodarone (PACERONE) 100 MG tablet Take 1 tablet (100 mg total) by mouth daily. (Patient not taking: Reported on 11/24/2022)   apixaban (ELIQUIS) 5 MG TABS tablet Stop taking your Eliquis UNTIL you follow-up with your cardiologist (Patient not taking: Reported on 11/24/2022)   lisinopril (ZESTRIL) 10 MG tablet STOP taking until you see your cardiologist (Patient not taking: Reported on 11/24/2022)   metoprolol succinate (TOPROL XL) 25 MG 24 hr tablet Take 0.5 tablets (12.5 mg total) by mouth daily. (Patient not taking: Reported on 11/24/2022)   No facility-administered encounter medications on file as of 11/24/2022.    Allergies (verified) Cefadroxil, Diltiazem hcl, Chlorthalidone, and Voltaren [diclofenac]   History: Past Medical History:  Diagnosis Date   Anemia    Atrial flutter (HCC) 01/2020   Cancer (HCC)    prostate   COVID 2021   mild case   Dysrhythmia    Aflutter   Heart failure with reduced ejection fraction (HCC)    Pt denies, it is in cardiologist notes.   Heart  murmur    Hypertension    Hypoglycemia    occ   NICM (nonischemic cardiomyopathy) (HCC) 02/12/2020   Osteomyelitis of third toe of left foot (HCC) 08/16/2022   Prostate cancer (HCC) 2019   Rheumatoid arthritis [facility])    Past Surgical History:  Procedure Laterality Date   AMPUTATION Right 02/10/2022   Procedure: RIGHT TRANSMETATARSAL AMPUTATION;  Surgeon: Nadara Mustard, MD;  Location: [facility] OR;  Service: Orthopedics;  Laterality: Right;   AMPUTATION Right 03/10/2022   Procedure: RIGHT FOOT CHOPART AMPUTATION;  Surgeon: Nadara Mustard, MD;  Location: [facility] OR;  Service: Orthopedics;  Laterality: Right;   AMPUTATION Left 08/16/2022   Procedure: LEFT 3RD TOE AMPUTATION;  Surgeon: Nadara Mustard, MD;  Location: [facility] OR;  Service: Orthopedics;  Laterality: Left;   AMPUTATION Left 09/13/2022   Procedure: LEFT TRANSMETATARSAL AMPUTATION;  Surgeon: Nadara Mustard, MD;  Location: [facility] OR;  Service: Orthopedics;  Laterality: Left;   AMPUTATION TOE Right 03/26/2020   Procedure: Right 3rd toe partial amputation, bone biopsy right 1st metatarsal;  Surgeon: Park Liter, DPM;  Location: [facility] ORS;  Service: Podiatry;  Laterality: Right;   BUBBLE STUDY  02/11/2020   Procedure: BUBBLE STUDY;  Surgeon: Sande Rives, MD;  Location: [facility] ENDOSCOPY;  Service: Cardiovascular;;   CARDIOVERSION N/A 02/11/2020   Procedure: CARDIOVERSION;  Surgeon: Sande Rives, MD;  Location: [facility] ENDOSCOPY;  Service: Cardiovascular;  Laterality: N/A;   CARDIOVERSION N/A 08/31/2021   Procedure: CARDIOVERSION;  Surgeon: Jake Bathe, MD;  Location: [facility] ENDOSCOPY;  Service: Cardiovascular;  Laterality: N/A;   CYSTOSCOPY  04/11/2018   Procedure: Derinda Late;  Surgeon: Crist Fat, MD;  Location: [facility];  Service: Urology;;  NO SEEDS FOUND IN BLADDER   HERNIA REPAIR  2009   inguinal   INTRAMEDULLARY (IM) NAIL INTERTROCHANTERIC Right 05/05/2020   Procedure: CPT 27245-Cephalomedullary nailing of right intertrochanteric femur fracture;  Surgeon: Roby Lofts, MD;  Location: [facility] OR;  Service: Orthopedics;  Laterality: Right;   INTRAMEDULLARY (IM) NAIL INTERTROCHANTERIC Right 12/24/2020   Procedure: REVISION FIXATION OF RIGHT INTERTROCHANTERIC FEMUR NONUNION;  Surgeon: Roby Lofts, MD;  Location: [facility] OR;  Service: Orthopedics;  Laterality: Right;   IRRIGATION AND DEBRIDEMENT FOOT Left 03/26/2020   Procedure: Left foot incision and drainage with removal of all non-viable soft tissue and bone - areas overlying the 2nd  and 5th metatarsals.;  Surgeon: Park Liter, DPM;  Location: [facility] ORS;  Service: Podiatry;  Laterality: Left;   RADIOACTIVE SEED IMPLANT N/A 04/11/2018   Procedure: RADIOACTIVE SEED IMPLANT/BRACHYTHERAPY IMPLANT;  Surgeon: Crist Fat, MD;  Location: [facility];  Service: Urology;  Laterality: N/A;   71     SEEDS IMPLANTED   RIGHT/LEFT HEART CATH AND CORONARY ANGIOGRAPHY N/A 02/09/2020   Procedure: RIGHT/LEFT HEART CATH AND CORONARY ANGIOGRAPHY;  Surgeon: Yvonne Kendall, MD;  Location: [facility] INVASIVE CV LAB;  Service: Cardiovascular;  Laterality: N/A;   SPACE OAR INSTILLATION N/A 04/11/2018   Procedure: SPACE OAR INSTILLATION;  Surgeon: Crist Fat, MD;  Location: [facility];  Service: Urology;  Laterality: N/A;   STUMP REVISION Left 09/15/2022   Procedure: REVISION LEFT TRANSMETATARSAL AMPUTATION;  Surgeon: Nadara Mustard, MD;  Location: [facility] OR;  Service: Orthopedics;  Laterality: Left;   TEE WITHOUT CARDIOVERSION N/A 02/11/2020   Procedure: TRANSESOPHAGEAL ECHOCARDIOGRAM (TEE);  Surgeon: Sande Rives, MD;  Location: [facility] ENDOSCOPY;  Service: Cardiovascular;  Laterality: N/A;   TOTAL  KNEE ARTHROPLASTY Bilateral 09/13/2015   Procedure: BILATERAL KNEE ARTHROPLASTY ;  Surgeon: Durene Romans, MD;  Location: [facility] ORS;  Service: Orthopedics;  Laterality: Bilateral;   Family History  Problem Relation Age of Onset   Prostate cancer Father    Prostate cancer Brother    Colon cancer Neg Hx    Rectal cancer Neg Hx    Stomach cancer Neg Hx    Social History   Socioeconomic History   Marital status: Married    Spouse name: Not on file   Number of children: Not on file   Years of education: Not on file   Highest education level: Not on file  Occupational History   Not on file  Tobacco Use   Smoking status: Never   Smokeless tobacco: Never  Vaping Use   Vaping status: Never Used  Substance and Sexual Activity   Alcohol use: Not Currently     Alcohol/week: 7.0 standard drinks of alcohol    Types: 7 Cans of beer per week   Drug use: Not Currently   Sexual activity: Yes  Other Topics Concern   Not on file  Social History Narrative   Not on file   Social Determinants of Health   Financial Resource Strain: Low Risk  (11/24/2022)   Overall Financial Resource Strain (CARDIA)    Difficulty of Paying Living Expenses: Not hard at all  Food Insecurity: No Food Insecurity (11/24/2022)   Hunger Vital Sign    Worried About Running Out of Food in the Last Year: Never true    Ran Out of Food in the Last Year: Never true  Transportation Needs: No Transportation Needs (11/24/2022)   PRAPARE - Administrator, Civil Service (Medical): No    Lack of Transportation (Non-Medical): No  Physical Activity: Inactive (11/24/2022)   Exercise Vital Sign    Days of Exercise per Week: 0 days    Minutes of Exercise per Session: 0 min  Stress: No Stress Concern Present (11/24/2022)   [employer] of Occupational Health - Occupational Stress Questionnaire    Feeling of Stress : Not at all  Social Connections: Moderately Integrated (11/24/2022)   Social Connection and Isolation Panel [NHANES]    Frequency of Communication with Friends and Family: More than three times a week    Frequency of Social Gatherings with Friends and Family: Three times a week    Attends Religious Services: 1 to 4 times per year    Active Member of Clubs or Organizations: No    Attends Banker Meetings: Never    Marital Status: Married    Tobacco Counseling Counseling given: Not Answered   Clinical Intake:  Pre-visit preparation completed: Yes  Pain : No/denies pain     Diabetes: No  How often do you need to have someone help you when you read instructions, pamphlets, or other written materials from your doctor or pharmacy?: 1 - Never  Interpreter Needed?: No  Information entered by :: Kandis Fantasia LPN   Activities of Daily Living     11/24/2022    9:39 PM 10/30/2022   10:55 AM  In your present state of health, do you have any difficulty performing the following activities:  Hearing? 0 0  Vision? 0 0  Difficulty concentrating or making decisions? 0 1  Walking or climbing stairs? 0 0  Dressing or bathing? 0 0  Doing errands, shopping? 0 0  Preparing Food and eating ? N   Using the Toilet? N  In the past six months, have you accidently leaked urine? N   Do you have problems with loss of bowel control? N   Managing your Medications? N   Managing your Finances? N   Housekeeping or managing your Housekeeping? N     Patient Care Team: Carney Living, MD as PCP - General (Family Medicine) Meriam Sprague, MD as PCP - Cardiology (Cardiology)  Indicate any recent Medical Services you may have received from other than [facility] providers in the past year (date may be approximate).     Assessment:   This is a routine wellness examination for Maurilio.  Hearing/Vision screen Hearing Screening - Comments:: Denies hearing difficulties   Vision Screening - Comments:: No vision problems; will schedule routine eye exam soon    Dietary issues and exercise activities discussed:     Goals Addressed             This Visit's Progress    Increase physical activity        Depression Screen    11/24/2022    9:43 PM 06/20/2022    3:20 PM 08/02/2021   11:03 AM 06/21/2021    8:46 AM 10/12/2020    2:28 PM 06/09/2020   11:18 AM 03/24/2020   10:20 AM  PHQ 2/9 Scores  PHQ - 2 Score 0 2 2 2 2 2 2   PHQ- 9 Score  11 8  3 4 4     Fall Risk    11/24/2022    9:46 PM 06/20/2022    3:19 PM 06/21/2021    8:46 AM 06/09/2020   11:18 AM 03/24/2020   10:20 AM  Fall Risk   Falls in the past year? 0 0 0 0 0  Number falls in past yr: 0 0 0 0   Injury with Fall? 0 0 0 0   Risk for fall due to : No Fall Risks      Follow up Falls prevention discussed;Education provided;Falls evaluation completed        MEDICARE RISK AT HOME:   Medicare Risk at Home - 11/24/22 2146     Any stairs in or around the home? No    If so, are there any without handrails? No    Home free of loose throw rugs in walkways, pet beds, electrical cords, etc? Yes    Adequate lighting in your home to reduce risk of falls? Yes    Life alert? No    Use of a cane, walker or w/c? Yes    Grab bars in the bathroom? Yes    Shower chair or bench in shower? No    Elevated toilet seat or a handicapped toilet? No             TIMED UP AND GO:  Was the test performed? No    Cognitive Function:        11/24/2022    9:46 PM  6CIT Screen  What Year? 0 points  What month? 0 points  What time? 0 points  Count back from 20 0 points  Months in reverse 0 points  Repeat phrase 0 points  Total Score 0 points    Immunizations Immunization History  Administered Date(s) Administered   Influenza Inj Mdck Quad Pf 02/23/2017   Influenza Whole 04/20/2013   Influenza, High Dose Seasonal PF 04/19/2018, 03/02/2019   Influenza-Unspecified 03/30/2016   PFIZER Comirnaty(Gray Top)Covid-19 Tri-Sucrose Vaccine 10/12/2020   PFIZER(Purple Top)SARS-COV-2 Vaccination 07/23/2019, 08/14/2019, 03/24/2020   Pneumococcal Conjugate-13 03/02/2019  Tdap 05/21/2013, 02/17/2019   Zoster, Live 08/28/2016    TDAP status: Up to date  Flu Vaccine status: Due, Education has been provided regarding the importance of this vaccine. Advised may receive this vaccine at local pharmacy or Health Dept. Aware to provide a copy of the vaccination record if obtained from local pharmacy or Health Dept. Verbalized acceptance and understanding.  Pneumococcal vaccine status: Due, Education has been provided regarding the importance of this vaccine. Advised may receive this vaccine at local pharmacy or Health Dept. Aware to provide a copy of the vaccination record if obtained from local pharmacy or Health Dept. Verbalized acceptance and understanding.  Covid-19 vaccine status: Information  provided on how to obtain vaccines.   Qualifies for Shingles Vaccine? Yes   Zostavax completed No   Shingrix Completed?: No.    Education has been provided regarding the importance of this vaccine. Patient has been advised to call insurance company to determine out of pocket expense if they have not yet received this vaccine. Advised may also receive vaccine at local pharmacy or Health Dept. Verbalized acceptance and understanding.  Screening Tests Health Maintenance  Topic Date Due   Diabetic kidney evaluation - Urine ACR  Never done   Zoster Vaccines- Shingrix (1 of 2) 11/19/1971   Colonoscopy  Never done   Pneumonia Vaccine 5+ Years old (2 of 2 - PPSV23 or PCV20) 03/01/2020   COVID-19 Vaccine (5 - 2023-24 season) 12/23/2021   INFLUENZA VACCINE  11/23/2022   Diabetic kidney evaluation - eGFR measurement  10/31/2023   Medicare Annual Wellness (AWV)  11/24/2023   DTaP/Tdap/Td (3 - Td or Tdap) 02/16/2029   Hepatitis C Screening  Completed   HPV VACCINES  Aged Out    Health Maintenance  Health Maintenance Due  Topic Date Due   Diabetic kidney evaluation - Urine ACR  Never done   Zoster Vaccines- Shingrix (1 of 2) 11/19/1971   Colonoscopy  Never done   Pneumonia Vaccine 29+ Years old (2 of 2 - PPSV23 or PCV20) 03/01/2020   COVID-19 Vaccine (5 - 2023-24 season) 12/23/2021   INFLUENZA VACCINE  11/23/2022    Colorectal cancer screening:  Patient declines at this time   Lung Cancer Screening: (Low Dose CT Chest recommended if Age 77-80 years, 20 pack-year currently smoking OR have quit w/in 15years.) does not qualify.   Lung Cancer Screening Referral: n/a  Additional Screening:  Hepatitis C Screening: does qualify; Completed 07/14/15  Vision Screening: Recommended annual ophthalmology exams for early detection of glaucoma and other disorders of the eye. Is the patient up to date with their annual eye exam?  No  Who is the provider or what is the name of the office in which the  patient attends annual eye exams? none If pt is not established with a provider, would they like to be referred to a provider to establish care? No .   Dental Screening: Recommended annual dental exams for proper oral hygiene  Community Resource Referral / Chronic Care Management: CRR required this visit?  No   CCM required this visit?  No    Plan:     I have personally reviewed and noted the following in the patient's chart:   Medical and social history Use of alcohol, tobacco or illicit drugs  Current medications and supplements including opioid prescriptions. Patient is currently taking opioid prescriptions. Information provided to patient regarding non-opioid alternatives. Patient advised to discuss non-opioid treatment plan with their provider. Functional ability and status Nutritional status  Physical activity Advanced directives List of other physicians Hospitalizations, surgeries, and ER visits in previous 12 months Vitals Screenings to include cognitive, depression, and falls Referrals and appointments  In addition, I have reviewed and discussed with patient certain preventive protocols, quality metrics, and best practice recommendations. A written personalized care plan for preventive services as well as general preventive health recommendations were provided to patient.     Kandis Fantasia [place], [place]   04/29/1094   After Visit Summary: (MyChart) Due to this being a telephonic visit, the after visit summary with patients personalized plan was offered to patient via MyChart   Nurse Notes: Patient is concerned that his cardiovascular medications were stopped when he was recently discharged.  Does not have an appointment with cardiology until September.  Would like to know if he should continue to hold these medications.        I have reviewed this visit and agree with the documentation.

## 2022-12-05 ENCOUNTER — Ambulatory Visit (INDEPENDENT_AMBULATORY_CARE_PROVIDER_SITE_OTHER): Payer: Medicare Other | Admitting: Family

## 2022-12-05 ENCOUNTER — Encounter: Payer: Self-pay | Admitting: Family

## 2022-12-05 DIAGNOSIS — T8781 Dehiscence of amputation stump: Secondary | ICD-10-CM

## 2022-12-05 MED ORDER — SULFAMETHOXAZOLE-TRIMETHOPRIM 800-160 MG PO TABS
1.0000 | ORAL_TABLET | Freq: Two times a day (BID) | ORAL | 0 refills | Status: DC
Start: 2022-12-05 — End: 2022-12-15

## 2022-12-05 NOTE — Progress Notes (Unsigned)
Office Visit Note   Patient: Thomas [name]           Date of Birth: Jun 06, 1950           MRN: [id number] Visit Date: 12/05/2022              Requested by: Carney Living, MD [address] [place],  [place] [address] PCP: Carney Living, MD  Chief Complaint  Patient presents with   Left Foot - Routine Post Op    09/15/2022 revision left TMA       HPI: The patient is a 72 year old gentleman who presents today in follow-up he is status post left transmetatarsal amputation revision May 24 of this year.  He is full weightbearing in regular shoewear with a cane.  He has been doing dry dressing changes over the open area on the lateral aspect of his incision he is complaining of significant increase in his pain as well as some associated swelling  Assessment & Plan: Visit Diagnoses: No diagnosis found.  Plan: Concern for cellulitis will place on a course of oral antibiotics discussed if he does not see any improvement in his pain and swelling in the next 48 hours he is to call or return.  He will continue with daily dose of cleansing dry dressings shrinker to the foot for compression.  Follow-Up Instructions: Return in about 2 weeks (around 12/19/2022).   Ortho Exam  Patient is alert, oriented, no adenopathy, well-dressed, normal affect, normal respiratory effort. On examination of the left foot the transmetatarsal amputation has been slow to heal laterally there is a 2 cm in diameter open area this is superficial filled in with pink tissue there is no active drainage there is some warmth to his foot with trace edema.  Imaging: No results found. No images are attached to the encounter.  Labs: Lab Results  Component Value Date   HGBA1C 6.0 (H) 09/05/2022   HGBA1C 6.2 (H) 02/03/2020   HGBA1C 5.3 10/28/2015   ESRSEDRATE 14 06/21/2021   ESRSEDRATE 17 05/18/2021   ESRSEDRATE 20 (H) 03/28/2020   CRP 19.2 (H) 06/21/2021   CRP 11.1 (H) 05/18/2021   CRP 2.7  (H) 03/28/2020   REPTSTATUS 11/02/2022 FINAL 10/28/2022   GRAMSTAIN  09/15/2022    RARE WBC PRESENT,BOTH PMN AND MONONUCLEAR NO ORGANISMS SEEN    CULT  10/28/2022    NO GROWTH 5 DAYS Performed at [facility], [address], [place], [place] [address]    [facility] STAPHYLOCOCCUS COHNII 09/15/2022     Lab Results  Component Value Date   ALBUMIN 2.6 (L) 10/30/2022   ALBUMIN 3.3 (L) 10/28/2022   ALBUMIN 4.0 06/30/2022    Lab Results  Component Value Date   MG 1.8 05/22/2021   MG 2.0 02/12/2020   MG 1.7 02/11/2020   Lab Results  Component Value Date   VD25OH 28.61 (L) 05/05/2020    No results found for: "PREALBUMIN"    Latest Ref Rng & Units 10/31/2022    6:25 AM 10/31/2022    2:20 AM 10/30/2022    6:20 AM  CBC EXTENDED  WBC 4.0 - 10.5 K/uL  3.4  3.2   RBC 4.22 - 5.81 MIL/uL  2.93  3.21   Hemoglobin 13.0 - 17.0 g/dL 7.4  6.8  7.4   HCT 53.6 - 52.0 % 24.7  22.7  24.5   Platelets 150 - 400 K/uL  183  177      There is  no height or weight on file to calculate BMI.  Orders:  No orders of the defined types were placed in this encounter.  No orders of the defined types were placed in this encounter.    Procedures: No procedures performed  Clinical Data: No additional findings.  ROS:  All other systems negative, except as noted in the HPI. Review of Systems  Objective: Vital Signs: There were no vitals taken for this visit.  Specialty Comments:  No specialty comments available.  PMFS History: Patient Active Problem List   Diagnosis Date Noted   Hypokalemia 10/30/2022   Common bile duct dilatation 10/29/2022   Altered mental status 10/28/2022   Anemia 10/28/2022   Wound dehiscence, surgical 09/19/2022   Depressed mood 06/20/2022   Dehiscence of amputation stump of left lower extremity (HCC) 03/10/2022   History of transmetatarsal amputation of right foot (HCC) 02/15/2022   Chronic osteomyelitis of toe, right (HCC) 02/10/2022   Fracture of  intertrochanteric section of femur, closed, right, with nonunion, subsequent encounter 12/24/2020   Inguinal hernia 10/18/2020   Chronic combined systolic and diastolic CHF, NYHA class 2 (HCC)    Osteoporosis 05/05/2020   Foot infection    NICM (nonischemic cardiomyopathy) (HCC), no significant CAD on cardiac cath   02/12/2020   Right foot pain    Rheumatoid arthritis (HCC)    Atrial flutter (HCC) 02/02/2020   Lower extremity edema 12/30/2019   Weight loss, non-intentional 11/24/2019   Chronic right hip pain 06/19/2019   Degenerative tear of acetabular labrum of right hip 05/02/2019   Primary osteoarthritis of right hip 05/02/2019   Skin ulcer (HCC) 01/29/2019   Malignant neoplasm of prostate (HCC) 01/16/2018   Insomnia 10/02/2017   S/P bilateral TKA 09/13/2015   Other bilateral secondary osteoarthritis of knee 07/14/2014   Heme positive stool 05/21/2013   Osteoarthritis, multiple sites 01/13/2011   Hypertension 12/06/2010   CLAUSTROPHOBIA 03/01/2010   Past Medical History:  Diagnosis Date   Anemia    Atrial flutter (HCC) 01/2020   Cancer (HCC)    prostate   COVID 2021   mild case   Dysrhythmia    Aflutter   Heart failure with reduced ejection fraction (HCC)    Pt denies, it is in cardiologist notes.   Heart murmur    Hypertension    Hypoglycemia    occ   NICM (nonischemic cardiomyopathy) (HCC) 02/12/2020   Osteomyelitis of third toe of left foot (HCC) 08/16/2022   Prostate cancer (HCC) 2019   Rheumatoid arthritis (HCC)     Family History  Problem Relation Age of Onset   Prostate cancer Father    Prostate cancer Brother    Colon cancer Neg Hx    Rectal cancer Neg Hx    Stomach cancer Neg Hx     Past Surgical History:  Procedure Laterality Date   AMPUTATION Right 02/10/2022   Procedure: RIGHT TRANSMETATARSAL AMPUTATION;  Surgeon: Nadara Mustard, MD;  Location: [facility] OR;  Service: Orthopedics;  Laterality: Right;   AMPUTATION Right 03/10/2022   Procedure: RIGHT  FOOT CHOPART AMPUTATION;  Surgeon: Nadara Mustard, MD;  Location: [facility] OR;  Service: Orthopedics;  Laterality: Right;   AMPUTATION Left 08/16/2022   Procedure: LEFT 3RD TOE AMPUTATION;  Surgeon: Nadara Mustard, MD;  Location: [facility] OR;  Service: Orthopedics;  Laterality: Left;   AMPUTATION Left 09/13/2022   Procedure: LEFT TRANSMETATARSAL AMPUTATION;  Surgeon: Nadara Mustard, MD;  Location: [facility] OR;  Service: Orthopedics;  Laterality: Left;  AMPUTATION TOE Right 03/26/2020   Procedure: Right 3rd toe partial amputation, bone biopsy right 1st metatarsal;  Surgeon: Park Liter, DPM;  Location: [facility] ORS;  Service: Podiatry;  Laterality: Right;   BUBBLE STUDY  02/11/2020   Procedure: BUBBLE STUDY;  Surgeon: Sande Rives, MD;  Location: [facility] ENDOSCOPY;  Service: Cardiovascular;;   CARDIOVERSION N/A 02/11/2020   Procedure: CARDIOVERSION;  Surgeon: Sande Rives, MD;  Location: [facility] ENDOSCOPY;  Service: Cardiovascular;  Laterality: N/A;   CARDIOVERSION N/A 08/31/2021   Procedure: CARDIOVERSION;  Surgeon: Jake Bathe, MD;  Location: [facility] ENDOSCOPY;  Service: Cardiovascular;  Laterality: N/A;   CYSTOSCOPY  04/11/2018   Procedure: Derinda Late;  Surgeon: Crist Fat, MD;  Location: [facility];  Service: Urology;;  NO SEEDS FOUND IN BLADDER   HERNIA REPAIR  2009   inguinal   INTRAMEDULLARY (IM) NAIL INTERTROCHANTERIC Right 05/05/2020   Procedure: CPT 27245-Cephalomedullary nailing of right intertrochanteric femur fracture;  Surgeon: Roby Lofts, MD;  Location: [facility] OR;  Service: Orthopedics;  Laterality: Right;   INTRAMEDULLARY (IM) NAIL INTERTROCHANTERIC Right 12/24/2020   Procedure: REVISION FIXATION OF RIGHT INTERTROCHANTERIC FEMUR NONUNION;  Surgeon: Roby Lofts, MD;  Location: [facility] OR;  Service: Orthopedics;  Laterality: Right;   IRRIGATION AND DEBRIDEMENT FOOT Left 03/26/2020   Procedure: Left foot incision and drainage with removal of all non-viable soft  tissue and bone - areas overlying the 2nd and 5th metatarsals.;  Surgeon: Park Liter, DPM;  Location: [facility] ORS;  Service: Podiatry;  Laterality: Left;   RADIOACTIVE SEED IMPLANT N/A 04/11/2018   Procedure: RADIOACTIVE SEED IMPLANT/BRACHYTHERAPY IMPLANT;  Surgeon: Crist Fat, MD;  Location: [facility];  Service: Urology;  Laterality: N/A;   71     SEEDS IMPLANTED   RIGHT/LEFT HEART CATH AND CORONARY ANGIOGRAPHY N/A 02/09/2020   Procedure: RIGHT/LEFT HEART CATH AND CORONARY ANGIOGRAPHY;  Surgeon: Yvonne Kendall, MD;  Location: [facility] INVASIVE CV LAB;  Service: Cardiovascular;  Laterality: N/A;   SPACE OAR INSTILLATION N/A 04/11/2018   Procedure: SPACE OAR INSTILLATION;  Surgeon: Crist Fat, MD;  Location: [facility];  Service: Urology;  Laterality: N/A;   STUMP REVISION Left 09/15/2022   Procedure: REVISION LEFT TRANSMETATARSAL AMPUTATION;  Surgeon: Nadara Mustard, MD;  Location: [facility] OR;  Service: Orthopedics;  Laterality: Left;   TEE WITHOUT CARDIOVERSION N/A 02/11/2020   Procedure: TRANSESOPHAGEAL ECHOCARDIOGRAM (TEE);  Surgeon: Sande Rives, MD;  Location: [facility] ENDOSCOPY;  Service: Cardiovascular;  Laterality: N/A;   TOTAL KNEE ARTHROPLASTY Bilateral 09/13/2015   Procedure: BILATERAL KNEE ARTHROPLASTY ;  Surgeon: Durene Romans, MD;  Location: [facility] ORS;  Service: Orthopedics;  Laterality: Bilateral;   Social History   Occupational History   Not on file  Tobacco Use   Smoking status: Never   Smokeless tobacco: Never  Vaping Use   Vaping status: Never Used  Substance and Sexual Activity   Alcohol use: Not Currently    Alcohol/week: 7.0 standard drinks of alcohol    Types: 7 Cans of beer per week   Drug use: Not Currently   Sexual activity: Yes

## 2022-12-14 ENCOUNTER — Other Ambulatory Visit: Payer: Self-pay | Admitting: Orthopedic Surgery

## 2022-12-19 ENCOUNTER — Telehealth: Payer: Self-pay | Admitting: *Deleted

## 2022-12-19 NOTE — Telephone Encounter (Signed)
Received a request for refill for Lisinopril but when I went to refill, it had a note to "stop until you see your cardiologist".  Will send to RN to see if ok.

## 2022-12-20 NOTE — Telephone Encounter (Signed)
Previous [place] pt now assigned to Dr Elease Hashimoto. Last seen in office by Robin Searing, NP on 06/30/22: 3.  Essential hypertension: -Patient's blood pressure today is well-controlled at 108/60 -Continue lisinopril 10 mg and metoprolol 25 mg daily  Patient scheduled to see Dr Elease Hashimoto on 02/19/23. Has had 4 hospitalizations since being seen. Most recent discharge papers from 10/31/22 advised pt (admitted for sepsis/osteomyelitis, altered mental status) to hold Lisinopril until seeing cardiology. No newly recorded vitals in EPIC. Reached out to patient for home BP log. Left voicemail advising he call us back.

## 2022-12-21 ENCOUNTER — Other Ambulatory Visit: Payer: Self-pay

## 2023-01-01 NOTE — Telephone Encounter (Signed)
Called and spoke with patient who states that he knows that he's supposed to be off the Lisinopril and that his pressure has been good, but has no readings to give at time of call. Advised that if he sees a change, to contact us back and we would address.

## 2023-01-08 DIAGNOSIS — R102 Pelvic and perineal pain: Secondary | ICD-10-CM | POA: Diagnosis not present

## 2023-01-08 DIAGNOSIS — N401 Enlarged prostate with lower urinary tract symptoms: Secondary | ICD-10-CM | POA: Diagnosis not present

## 2023-01-08 DIAGNOSIS — Z8546 Personal history of malignant neoplasm of prostate: Secondary | ICD-10-CM | POA: Diagnosis not present

## 2023-01-08 DIAGNOSIS — R351 Nocturia: Secondary | ICD-10-CM | POA: Diagnosis not present

## 2023-01-09 NOTE — Progress Notes (Signed)
treated

## 2023-01-12 ENCOUNTER — Ambulatory Visit: Payer: Medicare Other | Admitting: Cardiology

## 2023-01-17 ENCOUNTER — Ambulatory Visit: Payer: Medicare Other | Admitting: Cardiology

## 2023-02-18 ENCOUNTER — Encounter: Payer: Self-pay | Admitting: Cardiovascular Disease

## 2023-02-18 NOTE — Progress Notes (Unsigned)
Cardiology Office Note:  .   Date:  02/19/2023  ID:  Thomas [name], DOB 1951-03-21, MRN [id number] PCP: Carney Living, MD  [facility] Providers Cardiologist:  Meriam Sprague, MD (Inactive)    History of Present Illness: .  Oct. 28, 2024   Thomas [name] is a 72 y.o. male pt of Dr. Shari Prows. I am seeing him for the first time today   Hx of HFrEF, NICM, atrial flutter, s/p DCCV, on eliquis ,  Nonobstructive CAD, osteomyelitis, s/p multiple toe amputations  He was admitted to the hospital on October 31, 2022 for altered mental status. He was found to have a lactic acidosis which resolved with 1 L of normal saline.  Brain MRI was negative.  UDS was positive for benzodiazepines and opioids both of which were prescribed to him in the past.  He was also found to have a mild anemia with a hemoglobin of 8.  His baseline was around 11.  Has been off all of his medications except for oxycodone.  Since July.  The discharge summary indicates that he should be on amiodarone 100 mg a day Eliquis 5 mg twice a day lisinopril 10 mg a day Metoprolol 25 mg a day   ROS:   Studies Reviewed: .         Risk Assessment/Calculations:    CHA2DS2-VASc Score = 3  This indicates a 3.2% annual risk of stroke. The patient's score is based upon: CHF History: 1 HTN History: 0 Diabetes History: 0 Stroke History: 0 Vascular Disease History: 1 Age Score: 1 Gender Score: 0        Physical Exam:   VS:  BP 138/80   Pulse 60   Ht 6\' 7"  (2.007 m)   Wt 205 lb (93 kg)   SpO2 97%   BMI 23.09 kg/m    Wt Readings from Last 3 Encounters:  02/19/23 205 lb (93 kg)  11/24/22 206 lb (93.4 kg)  10/29/22 206 lb 9.1 oz (93.7 kg)    GEN: Well nourished, well developed in no acute distress NECK: No JVD; No carotid bruits CARDIAC: RRR, no murmurs, rubs, gallops RESPIRATORY:  Clear to auscultation without rales, wheezing or rhonchi  ABDOMEN: Soft, non-tender,  non-distended EXTREMITIES:  no edema ,  multiple toe amputations   ASSESSMENT AND PLAN: .   Chronic combined systolic and diastolic congestive heart failure: No presents for further evaluation of his CHF.  He has been off of his lisinopril and metoprolol for 3 to 4 months.  He states that he feels fine.  His heart rate and blood pressure seem to be fairly well-controlled.  Will discontinue the lisinopril and metoprolol for now.  Will start him on losartan 25 mg a day.  His echocardiogram from July, 2024 reveals normal left ventricular systolic function.  2.  History of anemia: He was anemic during his last hospitalization.  He was on Eliquis at that time.  Eliquis has been held since then.  Will restart his Eliquis and recheck a CBC and basic metabolic profile in approximately 5 months.  He will return to see Robin Searing, NP in 6 months.       Dispo: 6 months with Robin Searing, NP   Signed, Kristeen Miss, MD

## 2023-02-19 ENCOUNTER — Ambulatory Visit: Payer: Medicare Other | Attending: Cardiology | Admitting: Cardiovascular Disease

## 2023-02-19 ENCOUNTER — Encounter: Payer: Self-pay | Admitting: Cardiovascular Disease

## 2023-02-19 VITALS — BP 138/80 | HR 60 | Ht 79.0 in | Wt 205.0 lb

## 2023-02-19 DIAGNOSIS — Z79899 Other long term (current) drug therapy: Secondary | ICD-10-CM | POA: Diagnosis not present

## 2023-02-19 DIAGNOSIS — D6869 Other thrombophilia: Secondary | ICD-10-CM

## 2023-02-19 DIAGNOSIS — I4892 Unspecified atrial flutter: Secondary | ICD-10-CM | POA: Diagnosis not present

## 2023-02-19 MED ORDER — APIXABAN 5 MG PO TABS: ORAL_TABLET | ORAL | 3 refills | Status: DC

## 2023-02-19 MED ORDER — LOSARTAN POTASSIUM 25 MG PO TABS: 25.0000 mg | ORAL_TABLET | Freq: Every day | ORAL | 3 refills | Status: DC

## 2023-02-19 NOTE — Patient Instructions (Signed)
Medication Instructions:  RESTART Eliquis 5mg  twice daily START Losartan 25mg  daily *If you need a refill on your cardiac medications before your next appointment, please call your pharmacy*  Lab Work: CBC, BMET today Repeat CBC/BMET in 6 months, prior to appt If you have labs (blood work) drawn today and your tests are completely normal, you will receive your results only by: MyChart Message (if you have MyChart) OR A paper copy in the mail If you have any lab test that is abnormal or we need to change your treatment, we will call you to review the results.  Follow-Up: At [facility], you and your health needs are our priority.  As part of our continuing mission to provide you with exceptional heart care, we have created designated Provider Care Teams.  These Care Teams include your primary Cardiologist (physician) and Advanced Practice Providers (APPs -  Physician Assistants and Nurse Practitioners) who all work together to provide you with the care you need, when you need it.  Your next appointment:   6 month(s)  Provider:   Robin Searing, NP

## 2023-02-20 DIAGNOSIS — M0579 Rheumatoid arthritis with rheumatoid factor of multiple sites without organ or systems involvement: Secondary | ICD-10-CM | POA: Diagnosis not present

## 2023-02-20 DIAGNOSIS — M79604 Pain in right leg: Secondary | ICD-10-CM | POA: Diagnosis not present

## 2023-02-20 DIAGNOSIS — L089 Local infection of the skin and subcutaneous tissue, unspecified: Secondary | ICD-10-CM | POA: Diagnosis not present

## 2023-02-20 DIAGNOSIS — M1991 Primary osteoarthritis, unspecified site: Secondary | ICD-10-CM | POA: Diagnosis not present

## 2023-02-20 LAB — BASIC METABOLIC PANEL
BUN/Creatinine Ratio: 18 (ref 10–24)
BUN: 14 mg/dL (ref 8–27)
CO2: 26 mmol/L (ref 20–29)
Calcium: 8.9 mg/dL (ref 8.6–10.2)
Chloride: 104 mmol/L (ref 96–106)
Creatinine, Ser: 0.76 mg/dL (ref 0.76–1.27)
Glucose: 77 mg/dL (ref 70–99)
Potassium: 4.9 mmol/L (ref 3.5–5.2)
Sodium: 142 mmol/L (ref 134–144)
eGFR: 97 mL/min/{1.73_m2} (ref >59)

## 2023-02-20 LAB — CBC
Hematocrit: 33.8 % — ABNORMAL LOW (ref 37.5–51.0)
Hemoglobin: 9.6 g/dL — ABNORMAL LOW (ref 13.0–17.7)
MCH: 22.3 pg — ABNORMAL LOW (ref 26.6–33.0)
MCHC: 28.4 g/dL — ABNORMAL LOW (ref 31.5–35.7)
MCV: 78 fL — ABNORMAL LOW (ref 79–97)
Platelets: 253 10*3/uL (ref 150–450)
RBC: 4.31 x10E6/uL (ref 4.14–5.80)
RDW: 16.9 % — ABNORMAL HIGH (ref 11.6–15.4)
WBC: 5.3 10*3/uL (ref 3.4–10.8)

## 2023-02-21 ENCOUNTER — Telehealth: Payer: Self-pay

## 2023-02-21 DIAGNOSIS — Z79899 Other long term (current) drug therapy: Secondary | ICD-10-CM

## 2023-02-21 DIAGNOSIS — D509 Iron deficiency anemia, unspecified: Secondary | ICD-10-CM

## 2023-02-21 NOTE — Telephone Encounter (Signed)
-----   Message from Kristeen Miss sent at 02/20/2023  9:09 PM EDT ----- He remains mildly anemic ( Hb is improving )  BMP is stable  Recheck CBC in 3 months

## 2023-02-21 NOTE — Telephone Encounter (Signed)
Called and spoke with patient who verbalized understanding of need to repeat CBC. Order placed at this time to be done in 3 months.

## 2023-03-26 DIAGNOSIS — S98922D Partial traumatic amputation of left foot, level unspecified, subsequent encounter: Secondary | ICD-10-CM | POA: Diagnosis not present

## 2023-03-26 DIAGNOSIS — S98911A Complete traumatic amputation of right foot, level unspecified, initial encounter: Secondary | ICD-10-CM | POA: Diagnosis not present

## 2023-04-03 ENCOUNTER — Encounter: Payer: Self-pay | Admitting: Family Medicine

## 2023-05-09 DIAGNOSIS — K080 Exfoliation of teeth due to systemic causes: Secondary | ICD-10-CM | POA: Diagnosis not present

## 2023-05-11 DIAGNOSIS — R351 Nocturia: Secondary | ICD-10-CM | POA: Diagnosis not present

## 2023-05-11 DIAGNOSIS — N401 Enlarged prostate with lower urinary tract symptoms: Secondary | ICD-10-CM | POA: Diagnosis not present

## 2023-05-11 DIAGNOSIS — Z8546 Personal history of malignant neoplasm of prostate: Secondary | ICD-10-CM | POA: Diagnosis not present

## 2023-05-11 DIAGNOSIS — R3915 Urgency of urination: Secondary | ICD-10-CM | POA: Diagnosis not present

## 2023-06-05 DIAGNOSIS — M419 Scoliosis, unspecified: Secondary | ICD-10-CM | POA: Diagnosis not present

## 2023-06-05 DIAGNOSIS — M25551 Pain in right hip: Secondary | ICD-10-CM | POA: Diagnosis not present

## 2023-06-26 DIAGNOSIS — M5416 Radiculopathy, lumbar region: Secondary | ICD-10-CM | POA: Diagnosis not present

## 2023-06-26 DIAGNOSIS — M47816 Spondylosis without myelopathy or radiculopathy, lumbar region: Secondary | ICD-10-CM | POA: Diagnosis not present

## 2023-06-26 DIAGNOSIS — M069 Rheumatoid arthritis, unspecified: Secondary | ICD-10-CM | POA: Diagnosis not present

## 2023-07-10 DIAGNOSIS — M545 Low back pain, unspecified: Secondary | ICD-10-CM | POA: Diagnosis not present

## 2023-07-10 DIAGNOSIS — Z01818 Encounter for other preprocedural examination: Secondary | ICD-10-CM | POA: Diagnosis not present

## 2023-07-30 DIAGNOSIS — M5416 Radiculopathy, lumbar region: Secondary | ICD-10-CM | POA: Diagnosis not present

## 2023-07-31 DIAGNOSIS — M25551 Pain in right hip: Secondary | ICD-10-CM | POA: Diagnosis not present

## 2023-08-03 DIAGNOSIS — M48062 Spinal stenosis, lumbar region with neurogenic claudication: Secondary | ICD-10-CM | POA: Diagnosis not present

## 2023-08-03 DIAGNOSIS — M546 Pain in thoracic spine: Secondary | ICD-10-CM | POA: Diagnosis not present

## 2023-08-06 ENCOUNTER — Telehealth: Payer: Self-pay

## 2023-08-06 ENCOUNTER — Other Ambulatory Visit (HOSPITAL_COMMUNITY): Payer: Self-pay | Admitting: Anesthesiology

## 2023-08-06 ENCOUNTER — Telehealth: Payer: Self-pay | Admitting: *Deleted

## 2023-08-06 DIAGNOSIS — M546 Pain in thoracic spine: Secondary | ICD-10-CM

## 2023-08-06 NOTE — Telephone Encounter (Signed)
 Patient with diagnosis of aflutter on Eliquis for anticoagulation.    Procedure:  7 TEETH FOR SIMPLE EXTRACTION  Date of procedure: TBD   CHA2DS2-VASc Score = 3   This indicates a 3.2% annual risk of stroke. The patient's score is based upon: CHF History: 1 HTN History: 0 Diabetes History: 0 Stroke History: 0 Vascular Disease History: 1 Age Score: 1 Gender Score: 0      CrCl 119 ml/min Platelet count 253  Patient does NOT require pre-op antibiotics for dental procedure.  Per office protocol, patient can hold Eliquis for 1 day prior to procedure.    **This guidance is not considered finalized until pre-operative APP has relayed final recommendations.**

## 2023-08-06 NOTE — Telephone Encounter (Signed)
 Spoke with patient who is agreeable to do a tele visit on 4/21 at 10:20 am. Med rec and consent have been done.

## 2023-08-06 NOTE — Telephone Encounter (Signed)
   Name: Thomas [name]  DOB: 1951/04/03  MRN: [id number]  Primary Cardiologist: Sonny Dust, MD (Inactive)   Preoperative team, please contact this patient and set up a phone call appointment for further preoperative risk assessment. Please obtain consent and complete medication review. Thank you for your help.  I confirm that guidance regarding antiplatelet and oral anticoagulation therapy has been completed and, if necessary, noted below.  Patient does NOT require pre-op antibiotics for dental procedure.   Per office protocol, patient can hold Eliquis for 1 day prior to procedure.  I also confirmed the patient resides in the state of [place] . As per [facility] Medical Board telemedicine laws, the patient must reside in the state in which the provider is licensed.   Ava Boatman, NP 08/06/2023, 1:56 PM [facility]

## 2023-08-06 NOTE — Telephone Encounter (Signed)
   Pre-operative Risk Assessment    Patient Name: Thomas [name]  DOB: 05/14/1950 MRN: [id number]   Date of last office visit: 02/19/23 DR. NAHSER Date of next office visit: NONE  Request for Surgical Clearance    Procedure:  Dental Extraction - Amount of Teeth to be Pulled:  7 TEETH FOR SIMPLE EXTRACTION  Date of Surgery:  Clearance TBD                                Surgeon:  DR.  Kelby Patches Group or Practice Name:  [facility]  Phone number:  [phone] Fax number:  [phone]   Type of Clearance Requested:   - Medical  - Pharmacy:  Hold Apixaban (Eliquis)     Type of Anesthesia:   LIDOCAINE, ARTICAINE   Additional requests/questions:    Signed, Insiya Oshea   08/06/2023, 11:55 AM

## 2023-08-06 NOTE — Telephone Encounter (Signed)
 Duplicate encounter - error.   Jolyn Needles, PharmD Candidate [employer] [facility] Extern 08/06/2023 14:01

## 2023-08-06 NOTE — Telephone Encounter (Signed)
  Patient Consent for Virtual Visit        Thomas [name] has provided verbal consent on 08/06/2023 for a virtual visit (video or telephone).   CONSENT FOR VIRTUAL VISIT FOR:  Thomas [name]  By participating in this virtual visit I agree to the following:  I hereby voluntarily request, consent and authorize [facility] and its employed or contracted physicians, physician assistants, nurse practitioners or other licensed health care professionals (the Practitioner), to provide me with telemedicine health care services (the "Services") as deemed necessary by the treating Practitioner. I acknowledge and consent to receive the Services by the Practitioner via telemedicine. I understand that the telemedicine visit will involve communicating with the Practitioner through live audiovisual communication technology and the disclosure of certain medical information by electronic transmission. I acknowledge that I have been given the opportunity to request an in-person assessment or other available alternative prior to the telemedicine visit and am voluntarily participating in the telemedicine visit.  I understand that I have the right to withhold or withdraw my consent to the use of telemedicine in the course of my care at any time, without affecting my right to future care or treatment, and that the Practitioner or I may terminate the telemedicine visit at any time. I understand that I have the right to inspect all information obtained and/or recorded in the course of the telemedicine visit and may receive copies of available information for a reasonable fee.  I understand that some of the potential risks of receiving the Services via telemedicine include:  Delay or interruption in medical evaluation due to technological equipment failure or disruption; Information transmitted may not be sufficient (e.g. poor resolution of images) to allow for appropriate medical decision making by the  Practitioner; and/or  In rare instances, security protocols could fail, causing a breach of personal health information.  Furthermore, I acknowledge that it is my responsibility to provide information about my medical history, conditions and care that is complete and accurate to the best of my ability. I acknowledge that Practitioner's advice, recommendations, and/or decision may be based on factors not within their control, such as incomplete or inaccurate data provided by me or distortions of diagnostic images or specimens that may result from electronic transmissions. I understand that the practice of medicine is not an exact science and that Practitioner makes no warranties or guarantees regarding treatment outcomes. I acknowledge that a copy of this consent can be made available to me via my patient portal [facility] MyChart), or I can request a printed copy by calling the office of [facility].    I understand that my insurance will be billed for this visit.   I have read or had this consent read to me. I understand the contents of this consent, which adequately explains the benefits and risks of the Services being provided via telemedicine.  I have been provided ample opportunity to ask questions regarding this consent and the Services and have had my questions answered to my satisfaction. I give my informed consent for the services to be provided through the use of telemedicine in my medical care

## 2023-08-07 ENCOUNTER — Ambulatory Visit (HOSPITAL_COMMUNITY)
Admission: RE | Admit: 2023-08-07 | Discharge: 2023-08-07 | Disposition: A | Discharge status: Home / Self Care | Source: Ambulatory Visit | Attending: Anesthesiology | Admitting: Anesthesiology

## 2023-08-07 DIAGNOSIS — M4804 Spinal stenosis, thoracic region: Secondary | ICD-10-CM | POA: Diagnosis not present

## 2023-08-07 DIAGNOSIS — M5134 Other intervertebral disc degeneration, thoracic region: Secondary | ICD-10-CM | POA: Diagnosis not present

## 2023-08-07 DIAGNOSIS — M47814 Spondylosis without myelopathy or radiculopathy, thoracic region: Secondary | ICD-10-CM | POA: Diagnosis not present

## 2023-08-07 DIAGNOSIS — M546 Pain in thoracic spine: Secondary | ICD-10-CM | POA: Diagnosis not present

## 2023-08-07 DIAGNOSIS — M4805 Spinal stenosis, thoracolumbar region: Secondary | ICD-10-CM | POA: Diagnosis not present

## 2023-08-10 DIAGNOSIS — M5416 Radiculopathy, lumbar region: Secondary | ICD-10-CM | POA: Diagnosis not present

## 2023-08-10 DIAGNOSIS — M48062 Spinal stenosis, lumbar region with neurogenic claudication: Secondary | ICD-10-CM | POA: Diagnosis not present

## 2023-08-10 DIAGNOSIS — M546 Pain in thoracic spine: Secondary | ICD-10-CM | POA: Diagnosis not present

## 2023-08-10 DIAGNOSIS — M545 Low back pain, unspecified: Secondary | ICD-10-CM | POA: Diagnosis not present

## 2023-08-13 ENCOUNTER — Ambulatory Visit: Admitting: Family Medicine

## 2023-08-13 ENCOUNTER — Encounter: Payer: Self-pay | Admitting: Family Medicine

## 2023-08-13 ENCOUNTER — Ambulatory Visit: Attending: Cardiovascular Disease | Admitting: Nurse Practitioner

## 2023-08-13 VITALS — BP 162/103 | HR 127 | Ht 79.0 in | Wt 204.0 lb

## 2023-08-13 DIAGNOSIS — M153 Secondary multiple arthritis: Secondary | ICD-10-CM

## 2023-08-13 DIAGNOSIS — R7303 Prediabetes: Secondary | ICD-10-CM

## 2023-08-13 DIAGNOSIS — I10 Essential (primary) hypertension: Secondary | ICD-10-CM

## 2023-08-13 DIAGNOSIS — M1611 Unilateral primary osteoarthritis, right hip: Secondary | ICD-10-CM

## 2023-08-13 DIAGNOSIS — G894 Chronic pain syndrome: Secondary | ICD-10-CM

## 2023-08-13 DIAGNOSIS — D509 Iron deficiency anemia, unspecified: Secondary | ICD-10-CM

## 2023-08-13 DIAGNOSIS — C61 Malignant neoplasm of prostate: Secondary | ICD-10-CM

## 2023-08-13 DIAGNOSIS — R4589 Other symptoms and signs involving emotional state: Secondary | ICD-10-CM

## 2023-08-13 DIAGNOSIS — Z01810 Encounter for preprocedural cardiovascular examination: Secondary | ICD-10-CM

## 2023-08-13 LAB — POCT GLYCOSYLATED HEMOGLOBIN (HGB A1C): Hemoglobin A1C: 5.7 % — AB (ref 4.0–5.6)

## 2023-08-13 NOTE — Assessment & Plan Note (Signed)
 Recheck Hb. Discussed need for colonoscopy, patient declined.

## 2023-08-13 NOTE — Assessment & Plan Note (Signed)
Continue to follow with urology

## 2023-08-13 NOTE — Progress Notes (Signed)
    SUBJECTIVE:   CHIEF COMPLAINT / HPI:   Hypertension, Systolic and diastolic CHF, Aflutter s/p DCCV, NICM: - follows with Cardiology, Dr. Alroy Aspen - ECHO 10/2021 LVEF 55-60%, no WMA, mild LVH. G1DD. Mild aortic root dilation. - Medications: eliquis , losartan  - Compliance: good - Checking BP at home: yes, 130s/80s at home.   Anemia - Hb 9.6 01/2023. With prior heme positive stool. Never had colonoscopy.   Rheumatoid arthritis - previously followed with Rheum, Dr. Ebbie Goldmann. Awaiting new referral. Awaiting appt.   Osteoarthritis - s/p b/l TKR and hip fracture s/p IM nailing. Follows with [facility], planning for back injections. On chronic narcotics managed by Ortho, cymbalta . States Ortho planning not to continue opioids.  H/o osteomyelitis - chronic R toe with subsequent transmetatarsal amputation of R foot.   H/o prostate cancer - follows with Urology. Had seed implants. Doing well on flomax .  HM - due for colonoscopy, shingrix, PCV20  OBJECTIVE:   BP (!) 162/103   Pulse (!) 127   Ht 6\' 7"  (2.007 m)   Wt 204 lb (92.5 kg)   SpO2 97%   BMI 22.98 kg/m   Gen: well appearing, in NAD Card: Reg rate Lungs: Comfortable WOB on RA Ext: WWP, no edema   ASSESSMENT/PLAN:   Osteoarthritis, multiple sites Referred to pain management.  Anemia Recheck Hb. Discussed need for colonoscopy, patient declined.  Hypertension Elevated and again on recheck. With normal home measurements, recommend keep home log and f/u in 1 month.   Malignant neoplasm of prostate (HCC) Continue to follow with urology.   Depressed mood Discussed elevated PHQ9. Passive SI, no plan or intent. Seems to be related to chronic health problems. Declines therapy/counseling. On cymbalta .   HM - got shingrix at [facility], will get records. Declines colonoscopy.  Kandis Ormond, DO

## 2023-08-13 NOTE — Patient Instructions (Addendum)
 It was great to see you!  Our plans for today:  - We are referring you to Pain Management. Let us  know if you don't hear about an appointment in the next few weeks.  - Let us  know if you change your mind about a colonoscopy.  - Monitor your blood pressure at home and keep a log of your readings. Make sure to be seated for at least 5 minutes prior to testing and not in pain or worked up for the most accurate readings. Bring this log with you to follow up.  - Come back in 1 month for follow up.   We are checking some labs today, we will release these results to your MyChart.  Take care and seek immediate care sooner if you develop any concerns.   Dr. Shanell Aden

## 2023-08-13 NOTE — Assessment & Plan Note (Signed)
 Discussed elevated PHQ9. Passive SI, no plan or intent. Seems to be related to chronic health problems. Declines therapy/counseling. On cymbalta .

## 2023-08-13 NOTE — Assessment & Plan Note (Signed)
Referred to pain management.

## 2023-08-13 NOTE — Assessment & Plan Note (Signed)
 Elevated and again on recheck. With normal home measurements, recommend keep home log and f/u in 1 month.

## 2023-08-13 NOTE — Progress Notes (Signed)
   Patient Name: Thomas [name]  DOB: 1950-07-05 MRN: [id number]  Primary Cardiologist: Ahmad Alert, MD  Chart reviewed as part of pre-operative protocol coverage.  Patient was originally scheduled for a telephone visit for preoperative cardiac evaluation for upcoming dental extraction (7 teeth to be pulled).  Patient states he will not be having more than 1 tooth pulled.  Simple dental extractions (i.e. 1-2 teeth) are considered low risk procedures per guidelines and generally do not require any specific cardiac clearance. It is also generally accepted that for simple extractions and dental cleanings, there is no need to interrupt blood thinner therapy.   SBE prophylaxis is not required for the patient from a cardiac standpoint.  I will route this recommendation to the requesting party via Epic fax function and remove from pre-op pool.  Please call with questions.  Jude Norton, NP 08/13/2023, 10:25 AM

## 2023-08-14 ENCOUNTER — Encounter: Payer: Self-pay | Admitting: Family Medicine

## 2023-08-14 LAB — CBC
Hematocrit: 35.6 % — ABNORMAL LOW (ref 37.5–51.0)
Hemoglobin: 10.8 g/dL — ABNORMAL LOW (ref 13.0–17.7)
MCH: 23.4 pg — ABNORMAL LOW (ref 26.6–33.0)
MCHC: 30.3 g/dL — ABNORMAL LOW (ref 31.5–35.7)
MCV: 77 fL — ABNORMAL LOW (ref 79–97)
Platelets: 275 10*3/uL (ref 150–450)
RBC: 4.62 x10E6/uL (ref 4.14–5.80)
RDW: 17.2 % — ABNORMAL HIGH (ref 11.6–15.4)
WBC: 5.9 10*3/uL (ref 3.4–10.8)

## 2023-08-14 LAB — IRON,TIBC AND FERRITIN PANEL
Ferritin: 111 ng/mL (ref 30–400)
Iron Saturation: 6 % — CL (ref 15–55)
Iron: 22 ug/dL — ABNORMAL LOW (ref 38–169)
Total Iron Binding Capacity: 339 ug/dL (ref 250–450)
UIBC: 317 ug/dL (ref 111–343)

## 2023-08-15 DIAGNOSIS — M5416 Radiculopathy, lumbar region: Secondary | ICD-10-CM | POA: Diagnosis not present

## 2023-08-21 DIAGNOSIS — R3916 Straining to void: Secondary | ICD-10-CM | POA: Diagnosis not present

## 2023-08-21 DIAGNOSIS — N401 Enlarged prostate with lower urinary tract symptoms: Secondary | ICD-10-CM | POA: Diagnosis not present

## 2023-08-21 DIAGNOSIS — Z8546 Personal history of malignant neoplasm of prostate: Secondary | ICD-10-CM | POA: Diagnosis not present

## 2023-08-21 DIAGNOSIS — R102 Pelvic and perineal pain: Secondary | ICD-10-CM | POA: Diagnosis not present

## 2023-08-30 DIAGNOSIS — M545 Low back pain, unspecified: Secondary | ICD-10-CM | POA: Diagnosis not present

## 2023-08-30 DIAGNOSIS — M5416 Radiculopathy, lumbar region: Secondary | ICD-10-CM | POA: Diagnosis not present

## 2023-08-30 DIAGNOSIS — M48062 Spinal stenosis, lumbar region with neurogenic claudication: Secondary | ICD-10-CM | POA: Diagnosis not present

## 2023-08-30 DIAGNOSIS — M546 Pain in thoracic spine: Secondary | ICD-10-CM | POA: Diagnosis not present

## 2023-09-04 ENCOUNTER — Ambulatory Visit: Payer: Self-pay | Admitting: Podiatry

## 2023-09-04 DIAGNOSIS — L97512 Non-pressure chronic ulcer of other part of right foot with fat layer exposed: Secondary | ICD-10-CM

## 2023-09-04 DIAGNOSIS — Z89431 Acquired absence of right foot: Secondary | ICD-10-CM | POA: Diagnosis not present

## 2023-09-04 NOTE — Progress Notes (Unsigned)
 Subjective:  Patient ID: Thomas [name], male    DOB: 1950/07/17,  MRN: [id number]  Chief Complaint  Patient presents with   Foot Ulcer    Right foot ulcer pt stated that it was doing good until he noticed this place on the bottom of his foot he stated that it has been there for about 3 weeks     73 y.o. male presents with the above complaint.  Patient presents with history of right transmetatarsal amputation.  Patient states that he was doing okay.  He noticed a small superficial ulceration on the plantar midfoot.  Wanted to discuss treatment options for this has been going for 3 weeks.  Denies any other acute complaints.  Pain scale 7 out of 10 dull aching nature   Review of Systems: Negative except as noted in the HPI. Denies N/V/F/Ch.  Past Medical History:  Diagnosis Date   Anemia    Atrial flutter (HCC) 01/2020   Cancer (HCC)    prostate   COVID 2021   mild case   Dysrhythmia    Aflutter   Heart failure with reduced ejection fraction (HCC)    Pt denies, it is in cardiologist notes.   Heart murmur    Hypertension    Hypoglycemia    occ   NICM (nonischemic cardiomyopathy) (HCC) 02/12/2020   Osteomyelitis of third toe of left foot (HCC) 08/16/2022   Prostate cancer (HCC) 2019   Rheumatoid arthritis (HCC)     Current Outpatient Medications:    ALPRAZolam (XANAX) 1 MG tablet, TAKE 1 PILL 1-1.5 HOURS BEFORE MRI. IF STILL ANXIOUS, TAKE 2ND TAB. DO NOT DRIVE AFTER TAKING THIS, Disp: , Rfl:    metoprolol  succinate (TOPROL -XL) 25 MG 24 hr tablet, Take 25 mg by mouth daily., Disp: , Rfl:    apixaban  (ELIQUIS ) 5 MG TABS tablet, Stop taking your Eliquis  UNTIL you follow-up with your cardiologist, Disp: 90 tablet, Rfl: 3   diazepam  (VALIUM ) 5 MG tablet, 5 mg at bedtime as needed for muscle spasms (To relax prostate). (Patient not taking: Reported on 02/19/2023), Disp: , Rfl:    DULoxetine  (CYMBALTA ) 60 MG capsule, TAKE 1 CAPSULE BY MOUTH EVERY DAY - (Patient not taking:  Reported on 02/19/2023), Disp: 90 capsule, Rfl: 1   folic acid (FOLVITE) 1 MG tablet, TAKE 1 TABLET BY MOUTH EVERY DAY FOR 90 DAYS, Disp: , Rfl:    losartan  (COZAAR ) 25 MG tablet, Take 1 tablet (25 mg total) by mouth daily., Disp: 90 tablet, Rfl: 3   methotrexate  (RHEUMATREX) 2.5 MG tablet, 6 TABLETS ORALLY ONCE A WEEK 91 DAYS, Disp: , Rfl:    ondansetron  (ZOFRAN -ODT) 4 MG disintegrating tablet, PLACE 2 TABLETS TWICE A DAY BY TRANSLINGUAL ROUTE, FOR NAUSEA., Disp: , Rfl:    oxycodone  20 MG TABS, Take 1 tablet (20 mg total) by mouth every 6 (six) hours as needed for pain., Disp: , Rfl:    predniSONE  (DELTASONE ) 5 MG tablet, Take 5 mg by mouth daily., Disp: , Rfl:    silver  sulfADIAZINE  (SILVADENE ) 1 % cream, Apply topically daily., Disp: , Rfl:    sulfamethoxazole -trimethoprim  (BACTRIM  DS) 800-160 MG tablet, Take 1 tablet by mouth 2 (two) times daily., Disp: , Rfl:    tamsulosin  (FLOMAX ) 0.4 MG CAPS capsule, Take 0.4 mg by mouth daily as needed (prostate spasms). (Patient not taking: Reported on 02/19/2023), Disp: , Rfl:    testosterone  cypionate (DEPOTESTOSTERONE CYPIONATE) 200 MG/ML injection, Inject 100 mg into the muscle once a week. (Patient not  taking: Reported on 02/19/2023), Disp: , Rfl:   Social History   Tobacco Use  Smoking Status Never  Smokeless Tobacco Never    Allergies  Allergen Reactions   Cefadroxil  Hives and Palpitations    EMS to ED from [facility].   Diltiazem  Swelling   Diltiazem  Hcl Swelling   Chlorthalidone  Other (See Comments)    "Makes me light-headed and I don't like the way it makes me feel"   Voltaren  [Diclofenac ] Rash   Objective:  There were no vitals filed for this visit. There is no height or weight on file to calculate BMI. Constitutional Well developed. Well nourished.  Vascular Dorsalis pedis pulses palpable bilaterally. Posterior tibial pulses palpable bilaterally. Capillary refill normal to all digits.  No cyanosis or clubbing noted. Pedal hair  growth normal.  Neurologic Normal speech. Oriented to person, place, and time. Epicritic sensation to light touch grossly present bilaterally.  Dermatologic Right plantar midfoot ulceration noted with fat layer exposed.  No purulent drainage noted no malodor present.  No cellulitis or redness noted.  Orthopedic: Right transmetatarsal amputation noted with positive gastrocsoleus equinus noted.   Radiographs: None Assessment:   1. Ulcer of right foot with fat layer exposed (HCC)   2. History of transmetatarsal amputation of right foot (HCC)    Plan:  Patient was evaluated and treated and all questions answered.  Right plantar midfoot wound with history of transmetatarsal amputation - All questions and concerns were discussed with the patient in extensive detail given the amount of pain that he is experiencing I encouraged him to do Betadine  wet-to-dry dressing - Likely this is coming from the equinus contracture.  Patient may need Achilles tendon lengthening in the future - Will continue local wound care for now see if there is any improvement.  No follow-ups on file.

## 2023-09-10 ENCOUNTER — Encounter: Payer: Self-pay | Admitting: Family Medicine

## 2023-09-10 ENCOUNTER — Ambulatory Visit: Admitting: Family Medicine

## 2023-09-10 VITALS — BP 136/75 | HR 125 | Ht 79.0 in | Wt 207.8 lb

## 2023-09-10 DIAGNOSIS — I10 Essential (primary) hypertension: Secondary | ICD-10-CM

## 2023-09-10 DIAGNOSIS — M25551 Pain in right hip: Secondary | ICD-10-CM

## 2023-09-10 DIAGNOSIS — G8929 Other chronic pain: Secondary | ICD-10-CM

## 2023-09-10 NOTE — Assessment & Plan Note (Signed)
 Will check on status of pain management referral.

## 2023-09-10 NOTE — Progress Notes (Signed)
   SUBJECTIVE:   CHIEF COMPLAINT / HPI:   Hypertension, Systolic and diastolic CHF, Aflutter s/p DCCV, NICM: - follows with Cardiology, Dr. Alroy Aspen - ECHO 10/2021 LVEF 55-60%, no WMA, mild LVH. G1DD. Mild aortic root dilation. - elevated BP at last visit, here for recheck.  - Medications: eliquis , losartan  - Compliance: good - Checking BP at home: yes, 120-130/70-80s  Chronic pain  - inquiring about who will take over his chronic opioid prescription, currently managed by Ortho - placed pain management referral at last visit, hasn't heard about appoitnment.     OBJECTIVE:   BP 136/75   Pulse (!) 125   Ht 6\' 7"  (2.007 m)   Wt 207 lb 12.8 oz (94.3 kg)   SpO2 95%   BMI 23.41 kg/m   Gen: well appearing, in NAD Lungs: comfortable WOB on RA Ext: WWP   ASSESSMENT/PLAN:   Hypertension At goal, no changes to medications. F/u 6 months.  Chronic right hip pain Will check on status of pain management referral.      Kandis Ormond, DO

## 2023-09-10 NOTE — Assessment & Plan Note (Signed)
 At goal, no changes to medications. F/u 6 months.

## 2023-09-10 NOTE — Patient Instructions (Addendum)
 It was great to see you!  Our plans for today:  - No changes to your medications.  - We will check with our referral coordinator on your pain management referral. - Come back in 6 months for follow up.  Take care and seek immediate care sooner if you develop any concerns.   Dr. Javen Ridings

## 2023-09-12 DIAGNOSIS — M5416 Radiculopathy, lumbar region: Secondary | ICD-10-CM | POA: Diagnosis not present

## 2023-10-01 ENCOUNTER — Ambulatory Visit

## 2023-10-01 VITALS — Ht 79.0 in | Wt 207.0 lb

## 2023-10-01 DIAGNOSIS — Z0000 Encounter for general adult medical examination without abnormal findings: Secondary | ICD-10-CM | POA: Diagnosis not present

## 2023-10-01 NOTE — Patient Instructions (Signed)
 Mr. Thomas [name] , Thank you for taking time out of your busy schedule to complete your Annual Wellness Visit with me. I enjoyed our conversation and look forward to speaking with you again next year. I, as well as your care team,  appreciate your ongoing commitment to your health goals. Please review the following plan we discussed and let me know if I can assist you in the future. Your Game plan/ To Do List    Referrals: If you haven't heard from the office you've been referred to, please reach out to them at the phone provided.   Follow up Visits: Next Medicare AWV with our clinical staff: 10/02/2024 at 8:30 a.m. Phone Visit with Nurse Health Advisor   Have you seen your provider in the last 6 months (3 months if uncontrolled diabetes)? Yes, 09/10/2023 with Dr. Alverna John  Next Office Visit with your provider: patient will schedule  Clinician Recommendations:  Aim for 30 minutes of exercise or brisk walking, 6-8 glasses of water , and 5 servings of fruits and vegetables each day.       This is a list of the screening recommended for you and due dates:  Health Maintenance  Topic Date Due   Zoster (Shingles) Vaccine (1 of 2) 11/19/1971   Colon Cancer Screening  Never done   Pneumonia Vaccine (2 of 2 - PPSV23) 04/27/2019   COVID-19 Vaccine (5 - 2024-25 season) 12/24/2022   Flu Shot  11/23/2023   Medicare Annual Wellness Visit  09/30/2024   DTaP/Tdap/Td vaccine (3 - Td or Tdap) 02/16/2029   Hepatitis C Screening  Completed   HPV Vaccine  Aged Out   Meningitis B Vaccine  Aged Out    Advanced directives: (Declined) Advance directive discussed with you today. Even though you declined this today, please call our office should you change your mind, and we can give you the proper paperwork for you to fill out. Advance Care Planning is important because it:  [x]  Makes sure you receive the medical care that is consistent with your values, goals, and preferences  [x]  It provides guidance to your family  and loved ones and reduces their decisional burden about whether or not they are making the right decisions based on your wishes.  Follow the link provided in your after visit summary or read over the paperwork we have mailed to you to help you started getting your Advance Directives in place. If you need assistance in completing these, please reach out to us  so that we can help you!  See attachments for Preventive Care and Fall Prevention Tips.

## 2023-10-01 NOTE — Progress Notes (Signed)
 Because this visit was a virtual/telehealth visit,  certain criteria was not obtained, such a blood pressure, CBG if applicable, and timed get up and go. Any medications not marked as "taking" were not mentioned during the medication reconciliation part of the visit. Any vitals not documented were not able to be obtained due to this being a telehealth visit or patient was unable to self-report a recent blood pressure reading due to a lack of equipment at home via telehealth. Vitals that have been documented are verbally provided by the patient.   Subjective:   Thomas [name] is a 73 y.o. who presents for a Medicare Wellness preventive visit.  As a reminder, Annual Wellness Visits don't include a physical exam, and some assessments may be limited, especially if this visit is performed virtually. We may recommend an in-person follow-up visit with your provider if needed.  Visit Complete: Virtual I connected with  Thomas [name] on 10/01/23 by a audio enabled telemedicine application and verified that I am speaking with the correct person using two identifiers.  Patient Location: Home  Provider Location: Office/Clinic  I discussed the limitations of evaluation and management by telemedicine. The patient expressed understanding and agreed to proceed.  Vital Signs: Because this visit was a virtual/telehealth visit, some criteria may be missing or patient reported. Any vitals not documented were not able to be obtained and vitals that have been documented are patient reported.  VideoDeclined- This patient declined Librarian, academic. Therefore the visit was completed with audio only.  Persons Participating in Visit: Patient.  AWV Questionnaire: No: Patient Medicare AWV questionnaire was not completed prior to this visit.  Cardiac Risk Factors include: advanced age (>62men, >41 women);hypertension;male gender;sedentary lifestyle     Objective:      Today's Vitals   10/01/23 0837  Weight: 207 lb (93.9 kg)  Height: 6\' 7"  (2.007 m)  PainSc: 9   PainLoc: Generalized   Body mass index is 23.32 kg/m.     10/01/2023    8:41 AM 09/10/2023    9:56 AM 11/24/2022    9:46 PM 10/30/2022   10:55 AM 09/15/2022   10:21 AM 09/13/2022    6:14 AM 08/16/2022    6:30 AM  Advanced Directives  Does Patient Have a Medical Advance Directive? No No No No No No No  Would patient like information on creating a medical advance directive? No - Patient declined  Yes (MAU/Ambulatory/Procedural Areas - Information given) No - Patient declined No - Patient declined No - Patient declined No - Patient declined    Current Medications (verified) Outpatient Encounter Medications as of 10/01/2023  Medication Sig   ALPRAZolam (XANAX) 1 MG tablet TAKE 1 PILL 1-1.5 HOURS BEFORE MRI. IF STILL ANXIOUS, TAKE 2ND TAB. DO NOT DRIVE AFTER TAKING THIS   apixaban  (ELIQUIS ) 5 MG TABS tablet Stop taking your Eliquis  UNTIL you follow-up with your cardiologist   diazepam  (VALIUM ) 5 MG tablet 5 mg at bedtime as needed for muscle spasms (To relax prostate). (Patient not taking: Reported on 02/19/2023)   DULoxetine  (CYMBALTA ) 60 MG capsule TAKE 1 CAPSULE BY MOUTH EVERY DAY - (Patient not taking: Reported on 02/19/2023)   folic acid (FOLVITE) 1 MG tablet TAKE 1 TABLET BY MOUTH EVERY DAY FOR 90 DAYS   losartan  (COZAAR ) 25 MG tablet Take 1 tablet (25 mg total) by mouth daily.   methotrexate  (RHEUMATREX) 2.5 MG tablet 6 TABLETS ORALLY ONCE A WEEK 91 DAYS   metoprolol  succinate (  TOPROL -XL) 25 MG 24 hr tablet Take 25 mg by mouth daily.   ondansetron  (ZOFRAN -ODT) 4 MG disintegrating tablet PLACE 2 TABLETS TWICE A DAY BY TRANSLINGUAL ROUTE, FOR NAUSEA.   oxycodone  20 MG TABS Take 1 tablet (20 mg total) by mouth every 6 (six) hours as needed for pain.   predniSONE  (DELTASONE ) 5 MG tablet Take 5 mg by mouth daily.   silver  sulfADIAZINE  (SILVADENE ) 1 % cream Apply topically daily.    sulfamethoxazole -trimethoprim  (BACTRIM  DS) 800-160 MG tablet Take 1 tablet by mouth 2 (two) times daily.   tamsulosin  (FLOMAX ) 0.4 MG CAPS capsule Take 0.4 mg by mouth daily as needed (prostate spasms). (Patient not taking: Reported on 02/19/2023)   testosterone  cypionate (DEPOTESTOSTERONE CYPIONATE) 200 MG/ML injection Inject 100 mg into the muscle once a week. (Patient not taking: Reported on 02/19/2023)   No facility-administered encounter medications on file as of 10/01/2023.    Allergies (verified) Cefadroxil , Diltiazem , Diltiazem  hcl, Chlorthalidone , and Voltaren  [diclofenac ]   History: Past Medical History:  Diagnosis Date   Anemia    Atrial flutter (HCC) 01/2020   Cancer (HCC)    prostate   COVID 2021   mild case   Dysrhythmia    Aflutter   Heart failure with reduced ejection fraction (HCC)    Pt denies, it is in cardiologist notes.   Heart murmur    Hypertension    Hypoglycemia    occ   NICM (nonischemic cardiomyopathy) (HCC) 02/12/2020   Osteomyelitis of third toe of left foot (HCC) 08/16/2022   Prostate cancer (HCC) 2019   Rheumatoid arthritis [facility])    Past Surgical History:  Procedure Laterality Date   AMPUTATION Right 02/10/2022   Procedure: RIGHT TRANSMETATARSAL AMPUTATION;  Surgeon: Timothy Ford, MD;  Location: [facility] OR;  Service: Orthopedics;  Laterality: Right;   AMPUTATION Right 03/10/2022   Procedure: RIGHT FOOT CHOPART AMPUTATION;  Surgeon: Timothy Ford, MD;  Location: [facility] OR;  Service: Orthopedics;  Laterality: Right;   AMPUTATION Left 08/16/2022   Procedure: LEFT 3RD TOE AMPUTATION;  Surgeon: Timothy Ford, MD;  Location: [facility] OR;  Service: Orthopedics;  Laterality: Left;   AMPUTATION Left 09/13/2022   Procedure: LEFT TRANSMETATARSAL AMPUTATION;  Surgeon: Timothy Ford, MD;  Location: [facility] OR;  Service: Orthopedics;  Laterality: Left;   AMPUTATION TOE Right 03/26/2020   Procedure: Right 3rd toe partial amputation, bone biopsy right 1st metatarsal;  Surgeon:  Camilo Cella, DPM;  Location: [facility] ORS;  Service: Podiatry;  Laterality: Right;   BUBBLE STUDY  02/11/2020   Procedure: BUBBLE STUDY;  Surgeon: Harrold Lincoln, MD;  Location: [facility] ENDOSCOPY;  Service: Cardiovascular;;   CARDIOVERSION N/A 02/11/2020   Procedure: CARDIOVERSION;  Surgeon: Harrold Lincoln, MD;  Location: [facility] ENDOSCOPY;  Service: Cardiovascular;  Laterality: N/A;   CARDIOVERSION N/A 08/31/2021   Procedure: CARDIOVERSION;  Surgeon: Hugh Madura, MD;  Location: [facility] ENDOSCOPY;  Service: Cardiovascular;  Laterality: N/A;   CYSTOSCOPY  04/11/2018   Procedure: Ardith Bedford;  Surgeon: Andrez Banker, MD;  Location: [facility];  Service: Urology;;  NO SEEDS FOUND IN BLADDER   HERNIA REPAIR  2009   inguinal   INTRAMEDULLARY (IM) NAIL INTERTROCHANTERIC Right 05/05/2020   Procedure: CPT 27245-Cephalomedullary nailing of right intertrochanteric femur fracture;  Surgeon: Laneta Pintos, MD;  Location: [facility] OR;  Service: Orthopedics;  Laterality: Right;   INTRAMEDULLARY (IM) NAIL INTERTROCHANTERIC Right 12/24/2020   Procedure: REVISION FIXATION OF RIGHT INTERTROCHANTERIC FEMUR NONUNION;  Surgeon: Haddix, Kevin  P, MD;  Location: [facility] OR;  Service: Orthopedics;  Laterality: Right;   IRRIGATION AND DEBRIDEMENT FOOT Left 03/26/2020   Procedure: Left foot incision and drainage with removal of all non-viable soft tissue and bone - areas overlying the 2nd and 5th metatarsals.;  Surgeon: Camilo Cella, DPM;  Location: [facility] ORS;  Service: Podiatry;  Laterality: Left;   RADIOACTIVE SEED IMPLANT N/A 04/11/2018   Procedure: RADIOACTIVE SEED IMPLANT/BRACHYTHERAPY IMPLANT;  Surgeon: Andrez Banker, MD;  Location: [facility];  Service: Urology;  Laterality: N/A;   71     SEEDS IMPLANTED   RIGHT/LEFT HEART CATH AND CORONARY ANGIOGRAPHY N/A 02/09/2020   Procedure: RIGHT/LEFT HEART CATH AND CORONARY ANGIOGRAPHY;  Surgeon: Sammy Crisp, MD;  Location: [facility]  INVASIVE CV LAB;  Service: Cardiovascular;  Laterality: N/A;   SPACE OAR INSTILLATION N/A 04/11/2018   Procedure: SPACE OAR INSTILLATION;  Surgeon: Andrez Banker, MD;  Location: [facility];  Service: Urology;  Laterality: N/A;   STUMP REVISION Left 09/15/2022   Procedure: REVISION LEFT TRANSMETATARSAL AMPUTATION;  Surgeon: Timothy Ford, MD;  Location: [facility] OR;  Service: Orthopedics;  Laterality: Left;   TEE WITHOUT CARDIOVERSION N/A 02/11/2020   Procedure: TRANSESOPHAGEAL ECHOCARDIOGRAM (TEE);  Surgeon: Harrold Lincoln, MD;  Location: [facility] ENDOSCOPY;  Service: Cardiovascular;  Laterality: N/A;   TOTAL KNEE ARTHROPLASTY Bilateral 09/13/2015   Procedure: BILATERAL KNEE ARTHROPLASTY ;  Surgeon: Claiborne Crew, MD;  Location: [facility] ORS;  Service: Orthopedics;  Laterality: Bilateral;   Family History  Problem Relation Age of Onset   Prostate cancer Father    Prostate cancer Brother    Colon cancer Neg Hx    Rectal cancer Neg Hx    Stomach cancer Neg Hx    Social History   Socioeconomic History   Marital status: Married    Spouse name: Not on file   Number of children: Not on file   Years of education: Not on file   Highest education level: Not on file  Occupational History   Not on file  Tobacco Use   Smoking status: Never   Smokeless tobacco: Never  Vaping Use   Vaping status: Never Used  Substance and Sexual Activity   Alcohol use: Not Currently    Alcohol/week: 7.0 standard drinks of alcohol    Types: 7 Cans of beer per week   Drug use: Not Currently   Sexual activity: Yes  Other Topics Concern   Not on file  Social History Narrative   Not on file   Social Drivers of Health   Financial Resource Strain: Low Risk  (10/01/2023)   Overall Financial Resource Strain (CARDIA)    Difficulty of Paying Living Expenses: Not hard at all  Food Insecurity: No Food Insecurity (10/01/2023)   Hunger Vital Sign    Worried About Running Out of Food in the Last Year:  Never true    Ran Out of Food in the Last Year: Never true  Transportation Needs: No Transportation Needs (10/01/2023)   PRAPARE - Administrator, Civil Service (Medical): No    Lack of Transportation (Non-Medical): No  Physical Activity: Inactive (10/01/2023)   Exercise Vital Sign    Days of Exercise per Week: 0 days    Minutes of Exercise per Session: 0 min  Stress: No Stress Concern Present (10/01/2023)   [employer] of Occupational Health - Occupational Stress Questionnaire    Feeling of Stress : Not at all  Social Connections: Moderately Integrated (  10/01/2023)   Social Connection and Isolation Panel [NHANES]    Frequency of Communication with Friends and Family: More than three times a week    Frequency of Social Gatherings with Friends and Family: Three times a week    Attends Religious Services: 1 to 4 times per year    Active Member of Clubs or Organizations: No    Attends Banker Meetings: Never    Marital Status: Married    Tobacco Counseling Counseling given: Not Answered    Clinical Intake:  Pre-visit preparation completed: Yes  Pain : 0-10 Pain Score: 9  Pain Type: Neuropathic pain     Nutritional Risks: None Diabetes: No  Lab Results  Component Value Date   HGBA1C 5.7 (A) 08/13/2023   HGBA1C 6.0 (H) 09/05/2022   HGBA1C 6.2 (H) 02/03/2020     How often do you need to have someone help you when you read instructions, pamphlets, or other written materials from your doctor or pharmacy?: 1 - Never  Interpreter Needed?: No  Information entered by :: Druscilla Gerhard, LPN.   Activities of Daily Living     10/01/2023    8:46 AM 11/24/2022    9:39 PM  In your present state of health, do you have any difficulty performing the following activities:  Hearing? 0 0  Vision? 0 0  Difficulty concentrating or making decisions? 0 0  Walking or climbing stairs? 1 0  Dressing or bathing? 0 0  Doing errands, shopping? 0 0  Preparing Food  and eating ? N N  Using the Toilet? N N  In the past six months, have you accidently leaked urine? N N  Do you have problems with loss of bowel control? N N  Managing your Medications? N N  Managing your Finances? N N  Housekeeping or managing your Housekeeping? N N    Patient Care Team: Jonne Netters, MD as PCP - General (Family Medicine) Nahser, Lela Purple, MD as PCP - Cardiology (Cardiology) Timothy Ford, MD as Consulting Physician (Orthopedic Surgery)  I have updated your Care Teams any recent Medical Services you may have received from other providers in the past year.     Assessment:    This is a routine wellness examination for Maximos.  Hearing/Vision screen Hearing Screening - Comments:: Denies hearing difficulties.  Vision Screening - Comments:: Yes reading glasses - not up to date with routine eye exams.    Goals Addressed             This Visit's Progress    10/01/2023: My goal is to survive 2025.         Depression Screen     10/01/2023    8:44 AM 09/10/2023    9:56 AM 08/13/2023   11:33 AM 11/24/2022    9:43 PM 06/20/2022    3:20 PM 08/02/2021   11:03 AM 06/21/2021    8:46 AM  PHQ 2/9 Scores  PHQ - 2 Score 2 0 2 0 2 2 2   PHQ- 9 Score 4 0 8  11 8      Fall Risk     10/01/2023    8:42 AM 08/13/2023   10:43 AM 11/24/2022    9:46 PM 06/20/2022    3:19 PM 06/21/2021    8:46 AM  Fall Risk   Falls in the past year? 1 1 0 0 0  Number falls in past yr: 1 1 0 0 0  Injury with Fall? 1 1 0 0 0  Risk for fall due to : History of fall(s);Impaired balance/gait;Orthopedic patient  No Fall Risks    Follow up Falls evaluation completed;Education provided  Falls prevention discussed;Education provided;Falls evaluation completed      MEDICARE RISK AT HOME:  Medicare Risk at Home Any stairs in or around the home?: Yes (Front Entrance- no rails, no ramp) If so, are there any without handrails?: No Home free of loose throw rugs in walkways, pet beds, electrical cords,  etc?: Yes Adequate lighting in your home to reduce risk of falls?: Yes Life alert?: Yes Use of a cane, walker or w/c?: Yes Grab bars in the bathroom?: Yes Shower chair or bench in shower?: Yes Elevated toilet seat or a handicapped toilet?: No  TIMED UP AND GO:  Was the test performed?  No  Cognitive Function: 6CIT completed    10/01/2023    8:44 AM  MMSE - Mini Mental State Exam  Not completed: Unable to complete        10/01/2023    8:57 AM 11/24/2022    9:46 PM  6CIT Screen  What Year? 0 points 0 points  What month? 0 points 0 points  What time? 0 points 0 points  Count back from 20 0 points 0 points  Months in reverse 0 points 0 points  Repeat phrase 0 points 0 points  Total Score 0 points 0 points    Immunizations Immunization History  Administered Date(s) Administered   Influenza Inj Mdck Quad Pf 02/23/2017   Influenza Whole 04/20/2013   Influenza, High Dose Seasonal PF 04/19/2018, 03/02/2019   Influenza-Unspecified 03/30/2016   PFIZER Comirnaty(Gray Top)Covid-19 Tri-Sucrose Vaccine 10/12/2020   PFIZER(Purple Top)SARS-COV-2 Vaccination 07/23/2019, 08/14/2019, 03/24/2020   Pneumococcal Conjugate-13 03/02/2019   Tdap 05/21/2013, 02/17/2019   Zoster, Live 08/28/2016    Screening Tests Health Maintenance  Topic Date Due   Zoster Vaccines- Shingrix (1 of 2) 11/19/1971   Colonoscopy  Never done   Pneumonia Vaccine 62+ Years old (2 of 2 - PPSV23) 04/27/2019   COVID-19 Vaccine (5 - 2024-25 season) 12/24/2022   INFLUENZA VACCINE  11/23/2023   Medicare Annual Wellness (AWV)  09/30/2024   DTaP/Tdap/Td (3 - Td or Tdap) 02/16/2029   Hepatitis C Screening  Completed   HPV VACCINES  Aged Out   Meningococcal B Vaccine  Aged Out    Health Maintenance  Health Maintenance Due  Topic Date Due   Zoster Vaccines- Shingrix (1 of 2) 11/19/1971   Colonoscopy  Never done   Pneumonia Vaccine 10+ Years old (2 of 2 - PPSV23) 04/27/2019   COVID-19 Vaccine (5 - 2024-25 season)  12/24/2022   Health Maintenance Items Addressed: Yes Patient aware of current care gaps.  Immunization record was verified by [facility] and updated in patient's chart. Patient would like to try Cologuard.  Additional Screening:  Vision Screening: Recommended annual ophthalmology exams for early detection of glaucoma and other disorders of the eye. Would you like a referral to an eye doctor? No    Dental Screening: Recommended annual dental exams for proper oral hygiene  Community Resource Referral / Chronic Care Management: CRR required this visit?  No   CCM required this visit?  No   Plan:    I have personally reviewed and noted the following in the patient's chart:   Medical and social history Use of alcohol, tobacco or illicit drugs  Current medications and supplements including opioid prescriptions. Patient is currently taking opioid prescriptions. Information provided to patient regarding non-opioid alternatives. Patient advised  to discuss non-opioid treatment plan with their provider. Functional ability and status Nutritional status Physical activity Advanced directives List of other physicians Hospitalizations, surgeries, and ER visits in previous 12 months Vitals Screenings to include cognitive, depression, and falls Referrals and appointments  In addition, I have reviewed and discussed with patient certain preventive protocols, quality metrics, and best practice recommendations. A written personalized care plan for preventive services as well as general preventive health recommendations were provided to patient.   Margette Sheldon, LPN   04/29/1094   After Visit Summary: (Declined) Due to this being a telephonic visit, with patients personalized plan was offered to patient but patient Declined AVS at this time   Notes: Patient aware of current care gaps.  Immunization record was verified by [facility] and updated in patient's chart. Patient would like to have an order for  Cologuard Stool Testing.

## 2023-10-03 ENCOUNTER — Ambulatory Visit: Admitting: Podiatry

## 2023-10-16 ENCOUNTER — Telehealth: Payer: Self-pay | Admitting: *Deleted

## 2023-10-16 NOTE — Telephone Encounter (Signed)
 Patient has requested to switch to faculty provider since he was with Dr. Jeanelle before.  He requested Dr. Rumball but at this time she is not taking on any new patients.   We can likely assigned him to Dr. Orie when she starts.  LMOVM for pt to return call to discuss.  Harlene Carte, CMA

## 2023-10-22 NOTE — Telephone Encounter (Signed)
 Have attempted to call several times, no response.  Will try again one more time tomorrow (10/23/23). Harlene Carte, CMA

## 2023-11-27 ENCOUNTER — Encounter: Admitting: Internal Medicine

## 2024-01-14 ENCOUNTER — Encounter: Payer: Self-pay | Admitting: Family Medicine

## 2024-01-14 ENCOUNTER — Ambulatory Visit (INDEPENDENT_AMBULATORY_CARE_PROVIDER_SITE_OTHER): Admitting: Family Medicine

## 2024-01-14 VITALS — BP 139/98 | HR 62 | Ht 79.0 in | Wt 180.0 lb

## 2024-01-14 DIAGNOSIS — G894 Chronic pain syndrome: Secondary | ICD-10-CM | POA: Diagnosis not present

## 2024-01-14 DIAGNOSIS — R4589 Other symptoms and signs involving emotional state: Secondary | ICD-10-CM

## 2024-01-14 DIAGNOSIS — L989 Disorder of the skin and subcutaneous tissue, unspecified: Secondary | ICD-10-CM | POA: Diagnosis not present

## 2024-01-14 DIAGNOSIS — K080 Exfoliation of teeth due to systemic causes: Secondary | ICD-10-CM | POA: Diagnosis not present

## 2024-01-14 MED ORDER — DOXYCYCLINE HYCLATE 100 MG PO TABS: 100.0000 mg | ORAL_TABLET | Freq: Two times a day (BID) | ORAL | 0 refills | Status: DC

## 2024-01-14 NOTE — Patient Instructions (Signed)
 Thank you for coming in today! Here is a summary of what we discussed:  - You should take antibiotics twice a day for 7 days.  This should help with the pain from the skin mass.  You can also use very warm compresses and massage the area to help with drainage  - We have scheduled you for an appointment with our Derm clinic on October 2 at 1:30 PM.  Please arrive 10 minutes early so we have time to the procedure.  - I also scheduled you for a video appointment with your primary care doctor.  -If you find that your mood is getting worse or you have increasing thoughts of self-harm, please seek medical care.  You can contact our clinic if we are open or you can always go to the behavioral health urgent care for assistance.  I have included their information below.  Please call the clinic at [phone] if your symptoms worsen or you have any concerns.  Best, Dr Adele  [facility]  [address] [place], [place] [address] [phone] or [phone] [facility] 24/7 FOR ANYONE [address], [place], [place]  [phone] Fax: [phone] [email or web address] *Interpreters available *Accepts all insurance and uninsured for Urgent Care needs *Accepts Medicaid and uninsured for outpatient treatment     ONLY FOR [facility]  New patient assessment and therapy walk-ins Mondays and Wednesdays 8am-11am First and second Fridays 1pm-5pm  New patient psychiatry and medication management walk-ins:  Mondays, Wednesdays, Thursdays, Fridays 8am -11am NO PSYCHIATRY WALK-INS on TUESDAYS

## 2024-01-14 NOTE — Assessment & Plan Note (Signed)
 Patient endorsed passive SI on PHQ-9 and during conversation.  Patient made several remarks/jokes about his wife wishing that he were dead and wishing the [place]  did assisted suicides.  Stated that he feels better when his pain is under control.  Confirmed the patient did not have a plan to hurt himself or anyone else.  Scheduled patient for close follow-up with PCP for ongoing monitoring.  Provided resources for rehab can encourage patient to seek support if his depressed mood/SI worsens.

## 2024-01-14 NOTE — Progress Notes (Signed)
    SUBJECTIVE:   CHIEF COMPLAINT / HPI:   Bump on back Present for weeks, gotten larger No fevers, chills Never had anything like this  Taking 20mg  oxycodone  every 6 hours Rx by Dr Kendal (ortho) Concerned that he won't be able to fill his medicines long term  Passive SI -no plan to hurt himself or anyone else -feels better when pain is under control -has not been to therapist, not interested now -does not feel that he has good support, feel that wife would be happier if I were dead   PERTINENT  PMH / PSH: HTN, A-flutter, CHF, RA, s/p right metatarsal amputation  OBJECTIVE:   BP (!) 144/91   Pulse (!) 103   Ht 6' 7 (2.007 m)   Wt 180 lb (81.6 kg)   SpO2 98%   BMI 20.28 kg/m   General: Awake and conversant, no acute distress Respiratory: Normal work of breathing on room air Neuro: No focal deficits Psych: No SI/HI, not responding to internal stimuli, somewhat depressed mood/affect, linear thinking  ASSESSMENT/PLAN:   Assessment & Plan Skin lesion Appears to be infected epidermoid cyst.  Will prescribe 7-day doxycycline  twice daily.  Patient scheduled for Derm clinic on October 2 for possible drainage Depressed mood Patient endorsed passive SI on PHQ-9 and during conversation.  Patient made several remarks/jokes about his wife wishing that he were dead and wishing the [place]  did assisted suicides.  Stated that he feels better when his pain is under control.  Confirmed the patient did not have a plan to hurt himself or anyone else.  Scheduled patient for close follow-up with PCP for ongoing monitoring.  Provided resources for rehab can encourage patient to seek support if his depressed mood/SI worsens. Chronic pain syndrome Patient is taking oxycodone  that is being prescribed by Ortho.  Patient expressed some concern that his orthopedic surgeon would stop prescribing these for him.  May be reasonable for PCP to take over management of his pain medication if patient is  interested in this    Rea Raring, MD [facility]

## 2024-01-21 ENCOUNTER — Other Ambulatory Visit: Payer: Self-pay | Admitting: Family Medicine

## 2024-01-21 DIAGNOSIS — L989 Disorder of the skin and subcutaneous tissue, unspecified: Secondary | ICD-10-CM

## 2024-01-21 NOTE — Progress Notes (Unsigned)
 Virtual Visit via Video Note  I connected with Thomas [name] on 01/21/24 at  2:10 PM EDT by a video enabled telemedicine application and verified that I am speaking with the correct person using two identifiers.  Location: Patient: Thomas [name]  Provider: Izetta Nap, DO - [facility]   I discussed the limitations of evaluation and management by telemedicine and the availability of in person appointments. The patient expressed understanding and agreed to proceed.  History of Present Illness: Depressed mood Passive SI at last visit.  Colon cancer screening -referred for colonoscopy, has never had this done per chart review   Observations/Objective:   Assessment and Plan:   Follow Up Instructions:    I discussed the assessment and treatment plan with the patient. The patient was provided an opportunity to ask questions and all were answered. The patient agreed with the plan and demonstrated an understanding of the instructions.   The patient was advised to call back or seek an in-person evaluation if the symptoms worsen or if the condition fails to improve as anticipated.  I provided *** minutes of non-face-to-face time during this encounter.   Izetta Nap, MD

## 2024-01-22 ENCOUNTER — Ambulatory Visit (INDEPENDENT_AMBULATORY_CARE_PROVIDER_SITE_OTHER): Payer: Self-pay | Admitting: Family Medicine

## 2024-01-22 DIAGNOSIS — G894 Chronic pain syndrome: Secondary | ICD-10-CM

## 2024-01-22 DIAGNOSIS — R4589 Other symptoms and signs involving emotional state: Secondary | ICD-10-CM | POA: Diagnosis not present

## 2024-01-22 DIAGNOSIS — L989 Disorder of the skin and subcutaneous tissue, unspecified: Secondary | ICD-10-CM

## 2024-01-22 NOTE — Assessment & Plan Note (Signed)
 In the setting of chronic health concerns, has found ways to try to cope and manage.  Declines additional resources or medication at this time.

## 2024-01-23 NOTE — Telephone Encounter (Signed)
 Dr. Orie has agreed to take him on as a new patient.  LMOVM for pt to return call to discuss. Harlene Carte, CMA

## 2024-01-23 NOTE — Telephone Encounter (Signed)
 Patient returns call.  His issue is not specifically changing of PCP it is that he values the relationship and the vibe with the provider.  We dicussed residency and the ins and outs associated with a practice like ours.  Also how at this time, although, he has seen Dr. Madelon the past few times that she was not accepting new patients he was just seen as a workin  He reports enjoying and relating to Dr. Theophilus during his telephone visit yesterday and would prefer to stay with her even though she is leaving in June.  Will leave as Dr. Theophilus patient.  Harlene Carte, CMA

## 2024-01-24 ENCOUNTER — Ambulatory Visit

## 2024-01-24 ENCOUNTER — Telehealth: Payer: Self-pay | Admitting: *Deleted

## 2024-01-24 DIAGNOSIS — L989 Disorder of the skin and subcutaneous tissue, unspecified: Secondary | ICD-10-CM

## 2024-01-24 NOTE — Telephone Encounter (Signed)
 Pt calls.  He is unable to make the Derm appt this afternoon due to transportation.  He and wife share one car and she is at work today.   He rescheduled for 10/16 but is requesting a refill of doxy because he is afraid back cyst is getting infected again.  Is started to weep again last night.  Harlene Carte, CMA

## 2024-01-25 MED ORDER — DOXYCYCLINE HYCLATE 100 MG PO TABS: 100.0000 mg | ORAL_TABLET | Freq: Two times a day (BID) | ORAL | 0 refills | Status: AC

## 2024-01-25 NOTE — Telephone Encounter (Signed)
 Spoke with patient regarding concern for draining lesion identified during previous office visit on 01/14/2024 as likely infected epidermoid cyst.  He completed a 10-day course of doxycycline  100 mg twice daily.  Patient reports he is having copious amounts of purulent drainage from the area and reports it is red and irritated.  He has follow-up scheduled for 10/7 for in person evaluation but given severity of symptoms going into the weekend will provide additional antibiotics until clinical evaluation possible.  Sent in doxycycline  100 mg twice daily x 7 days.  Advised of return precautions.  Izetta Nap, DO

## 2024-01-28 NOTE — Progress Notes (Unsigned)
    SUBJECTIVE:   CHIEF COMPLAINT / HPI:   Skin lesion Initially seen 01/14/2024 found to have infected epidermoid cyst given doxycycline  x 7-day course.  Seen for follow-up virtual visit on 9/30, continue drainage but no signs of infection reported.  And follow-up called the nurse line and reported copious purulent drainage on 01/24/2024, prescribed additional course of doxycycline .  Reports persistent drainage.  He reports swelling and area difficult to put dressing on it.  Had to use the help stick a bandage to his back.  Reports his wife is able to help with dressing changes.  Denies fever or sick symptoms.  Would like the area removed as soon as possible.  PERTINENT  PMH / PSH: HTN, A-flutter, CHF, RA, osteoporosis, prostate cancer, chronic pain  OBJECTIVE:   BP 118/78   Pulse 100   Wt 175 lb (79.4 kg)   SpO2 96%   BMI 19.71 kg/m    General: NAD, pleasant, able to participate in exam Skin: Approximately 3 cm x 2 cm open, draining lesion on central back.  Some crusting.  Solution around the rim.  Able to express thick, white discharge with pressure although significantly painful.    ASSESSMENT/PLAN:   Assessment & Plan Sebaceous cyst Now draining, unfortunately difficult location for topical therapy and dressing changes and patient without assistance at home.  Completing doxycycline  course on 10/10, transition to topical mupirocin  under DuoDERM with dressing changes every couple of days.  Patient desires removal as soon as possible, will refer to general surgery for evaluation. -Mupirocin  at least daily -Referral to Gen Surg   Dr. Izetta Nap, DO [facility]

## 2024-01-29 ENCOUNTER — Ambulatory Visit: Admitting: Family Medicine

## 2024-01-29 ENCOUNTER — Encounter: Payer: Self-pay | Admitting: Family Medicine

## 2024-01-29 VITALS — BP 118/78 | HR 100 | Wt 175.0 lb

## 2024-01-29 DIAGNOSIS — L723 Sebaceous cyst: Secondary | ICD-10-CM

## 2024-01-29 MED ORDER — MUPIROCIN 2 % EX OINT: 1.0000 | TOPICAL_OINTMENT | Freq: Two times a day (BID) | CUTANEOUS | 0 refills | Status: DC

## 2024-01-29 NOTE — Patient Instructions (Addendum)
 It was wonderful to see you today! Thank you for choosing [facility].   Please bring ALL of your medications with you to every visit.   Today we talked about:  For the spot on your back please complete the antibiotic course this Friday and after that time put topical Mupirocin  ideally twice per day but atleast once per day over the area. I will also give you some dressings to change out every couple of days. I am going to place the referral to General Surgery to hopefully have the area evaluated for complete excision.  Please follow up in 1 week  Call the clinic at [phone] if your symptoms worsen or you have any concerns.  Please be sure to schedule follow up at the front desk before you leave today.   Izetta Nap, DO Family Medicine

## 2024-02-04 ENCOUNTER — Telehealth: Payer: Self-pay

## 2024-02-04 NOTE — Telephone Encounter (Signed)
 Patient calls nurse line in regards to Bactroban  Ointment.   He reports he applied the first application on Friday, however reported an immediate reaction. He reports his skin became inflamed and very itchy. He reports he had to wash it off.   He is requesting another round of doxycycline . He reports it is very hard for him to reach back and apply any type of cream or ointment.   Advised will forward to PCP.

## 2024-02-05 NOTE — Telephone Encounter (Signed)
 Patient calls nurse line again in regards to request.   Advised will forward to PCP.

## 2024-02-05 NOTE — Telephone Encounter (Signed)
 Called patient.   Recommendations given. He reports he does not feel the area is infected and does not feel he needs a FU apt at this time.   He reports he already has an apt on 10/21 with PCP.   He reports she has not heard from General Surgery.   Advised the referral is still pending.   Will forward to referral coordinator for an update.

## 2024-02-06 NOTE — Telephone Encounter (Signed)
 Patient contacted.   [facility] information given to patient.   Patient advised he can contact them directly to schedule.

## 2024-02-07 ENCOUNTER — Ambulatory Visit

## 2024-02-07 ENCOUNTER — Telehealth: Payer: Self-pay

## 2024-02-07 NOTE — Telephone Encounter (Signed)
 Patient LVM on nurse line requesting to speak with nursing staff or Dr. Theophilus.   Returned call to patient. Patient reports that he had a friend that recently passed away. He states that he noticed that he had lost a lot of weight fairly quickly.   Patient is concerned as he has also lost weight without trying over the last two years. Per chart review, patient has lost approx 30 pounds in the last six month.   Patient has upcoming appt on 10/21 with PCP. Advised discussing concerns with PCP to determine plan for evaluating cause of weight loss.   Patient voices understanding and was appreciative of call.   Chiquita JAYSON English, RN

## 2024-02-12 ENCOUNTER — Ambulatory Visit: Admitting: Family Medicine

## 2024-02-12 ENCOUNTER — Encounter: Payer: Self-pay | Admitting: Family Medicine

## 2024-02-12 VITALS — BP 140/90 | HR 124 | Ht 79.0 in | Wt 186.2 lb

## 2024-02-12 DIAGNOSIS — I4892 Unspecified atrial flutter: Secondary | ICD-10-CM | POA: Diagnosis not present

## 2024-02-12 DIAGNOSIS — R634 Abnormal weight loss: Secondary | ICD-10-CM

## 2024-02-12 DIAGNOSIS — L723 Sebaceous cyst: Secondary | ICD-10-CM | POA: Diagnosis not present

## 2024-02-12 DIAGNOSIS — M069 Rheumatoid arthritis, unspecified: Secondary | ICD-10-CM | POA: Diagnosis not present

## 2024-02-12 MED ORDER — PREDNISONE 50 MG PO TABS: 50.0000 mg | ORAL_TABLET | Freq: Every day | ORAL | 0 refills | Status: DC

## 2024-02-12 MED ORDER — METOPROLOL TARTRATE 25 MG PO TABS: 12.5000 mg | ORAL_TABLET | Freq: Two times a day (BID) | ORAL | 0 refills | Status: DC

## 2024-02-12 NOTE — Patient Instructions (Addendum)
 It was wonderful to see you today! Thank you for choosing [facility].   Please bring ALL of your medications with you to every visit.   Today we talked about:  For your arm pain I am giving you a short course of steroids to see if we can calm down any inflammation you are having.  Please take the prednisone  daily for the next 5 days.  Please keep your follow-up with the rheumatologist in the fall for continued management of your RA. I would like to start your metoprolol  back in the setting of your atrial flutter.  I would like you to take it twice per day and call the cardiologist to schedule follow-up appointment as soon as available.  Please continue to take the medication until you are seen by them for further management.  If you have any issues with the medication or concerns please let us  know. For your weight loss I do think focusing on 3 small meals a day and adding in a protein shake whether body or homemade supplementation for the next month and we will do a weight check is a good idea.  Please follow-up with urologist to monitor your prostate cancer next week as they will check for any recurrence or concerns there. Please call the surgeon to discuss cyst removal as I think it will likely come back  [facility] [address] [place], [place] [address] Get Directions Tel [phone] Fax [phone]  Please follow up in 1 month for weight check  If you haven't already, sign up for My Chart to have easy access to your labs results, and communication with your primary care physician.  Call the clinic at [phone] if your symptoms worsen or you have any concerns.  Please be sure to schedule follow up at the front desk before you leave today.   Izetta Nap, DO Family Medicine

## 2024-02-12 NOTE — Assessment & Plan Note (Signed)
 Weight fluctuation over time, appears to be down around 30 pounds since the start of this year which has been the trend in recent years.  No other concerning symptoms.  Possibly in the setting of depressed mood and chronic pain.  Some symptoms of dysphagia and due for colonoscopy, patient declined additional workup with GI.  H/o prostate cancer, follow-up with urology next week for surveillance.  Recommended protein supplementation in between meals and optimizing dietary intake with weight check in 1 month.

## 2024-02-12 NOTE — Progress Notes (Cosign Needed Addendum)
    SUBJECTIVE:   CHIEF COMPLAINT / HPI:   Sebaceous cyst Initially presented on 01/14/2024, since that time has received doxycycline  x 2 rounds.  Upon last visit on 01/29/2024 area actively draining but did not appear infected.  Recommended topical antibiotic and DuoDERM dressing.  Referred to general surgery for removal at patient request.  Symptomatically now has stopped draining.  Denies any redness or irritation of the area.  Rheumatoid arthritis Was seeing Rheum, Dr. Lillian. Was taking Methotrexate  and Embryl shots but has since stopped therapy. Having arm pain x 3 weeks.  Worsening deviation of his hands due to RA over time.  Planning to establish with new rheumatologist in 03/2024.  Weight loss Lost 40 pounds since the first of the year, feels like he has difficulty swallowing. Weight has fluctated over time in the setting of prior surgeries, has been down to the 180s before.  Mood component, feels depressed mood in the setting of chronic pain.  PERTINENT  PMH / PSH: HTN, atrial flutter, CHF, RA, OA  OBJECTIVE:   BP (!) 140/90   Pulse (!) 124   Ht 6' 7 (2.007 m)   Wt 186 lb 3.2 oz (84.5 kg)   SpO2 98%   BMI 20.98 kg/m    General: NAD, pleasant, able to participate in exam Cardiac: Tachycardia.  Regular rhythm.  No murmur. Respiratory: CTAB, normal effort, No wheezes, rales or rhonchi Extremities: no edema or cyanosis. Skin: warm and dry.  Approximately 1 cm x 0.5 cm oval scab on central back with no surrounding erythema or drainage.  Some underlying fluctuance palpable around left rim of scab. MSK: Chronic arthritic changes of bilateral elbows and hands notable.  Significant joint enlargement of DIP and PIP joints of bilateral hands including ulnar deviation of the fingers (left hand worse than right) Neuro: alert, no obvious focal deficits Psych: Normal affect and mood   ASSESSMENT/PLAN:   Assessment & Plan Sebaceous cyst Now healed, small area of fluid palpable  likely to recur.  Desires removal, provided referral information for general surgery. Rheumatoid arthritis flare (HCC) Possible RA flare, pending establishment with new rheumatologist in 03/2024.  Will treat with burst of steroids recommend follow-up as scheduled. -Prednisone  50 mg x 5 days Atrial flutter, unspecified type (HCC) Asymptomatic.  HR 120s upon repeat, unclear if persistently tachycardic but in the setting of atrial flutter will err on the side of caution and reinitiate beta-blocker stopped by cardiology in 01/2023.  Recommend follow-up with cardiology for continued management.  ER precautions discussed. -Start metoprolol  to tartrate 12.5 mg twice daily Weight loss, non-intentional Weight fluctuation over time, appears to be down around 30 pounds since the start of this year which has been the trend in recent years.  No other concerning symptoms.  Possibly in the setting of depressed mood and chronic pain.  Some symptoms of dysphagia and due for colonoscopy, patient declined additional workup with GI.  H/o prostate cancer, follow-up with urology next week for surveillance.  Recommended protein supplementation in between meals and optimizing dietary intake with weight check in 1 month.     Dr. Izetta Nap, DO [facility]

## 2024-02-12 NOTE — Assessment & Plan Note (Addendum)
 Asymptomatic.  HR 120s upon repeat, unclear if persistently tachycardic but in the setting of atrial flutter will err on the side of caution and reinitiate beta-blocker stopped by cardiology in 01/2023.  Recommend follow-up with cardiology for continued management.  ER precautions discussed. -Start metoprolol  to tartrate 12.5 mg twice daily

## 2024-02-18 DIAGNOSIS — R3912 Poor urinary stream: Secondary | ICD-10-CM | POA: Diagnosis not present

## 2024-02-18 DIAGNOSIS — Z8546 Personal history of malignant neoplasm of prostate: Secondary | ICD-10-CM | POA: Diagnosis not present

## 2024-02-18 DIAGNOSIS — K080 Exfoliation of teeth due to systemic causes: Secondary | ICD-10-CM | POA: Diagnosis not present

## 2024-02-21 ENCOUNTER — Encounter: Admitting: Internal Medicine

## 2024-02-21 ENCOUNTER — Ambulatory Visit

## 2024-03-06 NOTE — Progress Notes (Signed)
 TERRIL CHESTNUT III                                          MRN: [id number]   03/06/2024   The [facility] Quality Team Specialist reviewed this patient medical record for the purposes of chart review for care gap closure. The following were reviewed: chart review for care gap closure-controlling blood pressure.    [facility] Quality Team

## 2024-03-18 NOTE — Progress Notes (Unsigned)
    SUBJECTIVE:   CHIEF COMPLAINT / HPI:   Weight loss  PERTINENT  PMH / PSH: ***  OBJECTIVE:   There were no vitals taken for this visit. ***  General: NAD, pleasant, able to participate in exam Cardiac: RRR, no murmurs. Respiratory: CTAB, normal effort, No wheezes, rales or rhonchi Abdomen: Bowel sounds present, nontender, nondistended Extremities: no edema or cyanosis. Skin: warm and dry, no rashes noted Neuro: alert, no obvious focal deficits Psych: Normal affect and mood  ASSESSMENT/PLAN:   No problem-specific Assessment & Plan notes found for this encounter.     Dr. Izetta Nap, DO [facility]    {    This will disappear when note is signed, click to select method of visit    :1}

## 2024-03-19 ENCOUNTER — Encounter: Payer: Self-pay | Admitting: Family Medicine

## 2024-03-19 ENCOUNTER — Ambulatory Visit: Admitting: Family Medicine

## 2024-03-19 VITALS — BP 140/80 | HR 94 | Ht 79.0 in | Wt 198.1 lb

## 2024-03-19 DIAGNOSIS — R634 Abnormal weight loss: Secondary | ICD-10-CM | POA: Diagnosis not present

## 2024-03-19 DIAGNOSIS — R4589 Other symptoms and signs involving emotional state: Secondary | ICD-10-CM | POA: Diagnosis not present

## 2024-03-19 DIAGNOSIS — G894 Chronic pain syndrome: Secondary | ICD-10-CM

## 2024-03-19 DIAGNOSIS — I10 Essential (primary) hypertension: Secondary | ICD-10-CM | POA: Diagnosis not present

## 2024-03-19 MED ORDER — TIZANIDINE HCL 4 MG PO TABS: 4.0000 mg | ORAL_TABLET | Freq: Every evening | ORAL | 2 refills | Status: DC | PRN

## 2024-03-19 NOTE — Patient Instructions (Signed)
 It was wonderful to see you today! Thank you for choosing [facility].   Please bring ALL of your medications with you to every visit.   Today we talked about:  Your weight looks great today!  You are up about 10 pounds which is good news that adjusting your diet has improved your weight trend.  You can focus on eating well-balanced meals and even supplementing with protein shakes as needed.  Please continue to intermittently check your weight at home to make sure it remains stable as you are around 198 pounds today. As we discussed the pain conversation is difficult given you have so many sources of pain.  If you would like to discuss a referral to a different pain management center you can let me know.  I am also happy to refer you for counseling or therapy services given the mental toll this can take on you.  I also prescribed the tizanidine  that you can take nightly as needed and if it is working well you can take it during the day although be wary of going out after taking it as it can be sedating.  Please follow up as needed for concerns  If you haven't already, sign up for My Chart to have easy access to your labs results, and communication with your primary care physician.  Call the clinic at [phone] if your symptoms worsen or you have any concerns.  Please be sure to schedule follow up at the front desk before you leave today.   Izetta Nap, DO Family Medicine

## 2024-03-19 NOTE — Assessment & Plan Note (Signed)
 Mood impacted by chronic pain. Previous counseling attempts unsuccessful. Discussed potential benefit of therapy. - Offered referral for counseling and therapy services, patient declined

## 2024-03-19 NOTE — Assessment & Plan Note (Signed)
 Ten pound weight gain in the past month due to dietary adjustments and protein supplementation.  Stable, recommend colonoscopy to be up-to-date on cancer screenings.  Recent normal PSA with urology. - Continue dietary regimen with balanced meals and protein supplementation. - Monitor weight periodically at home

## 2024-03-19 NOTE — Assessment & Plan Note (Signed)
 Right at goal for age, continue current therapy.

## 2024-04-06 ENCOUNTER — Other Ambulatory Visit: Payer: Self-pay | Admitting: Family Medicine

## 2024-04-06 DIAGNOSIS — I4892 Unspecified atrial flutter: Secondary | ICD-10-CM

## 2024-04-10 ENCOUNTER — Ambulatory Visit

## 2024-04-10 ENCOUNTER — Ambulatory Visit: Attending: Internal Medicine | Admitting: Internal Medicine

## 2024-04-10 VITALS — BP 139/96 | HR 123 | Temp 98.0°F | Resp 18 | Ht 73.0 in | Wt 189.0 lb

## 2024-04-10 DIAGNOSIS — Z79899 Other long term (current) drug therapy: Secondary | ICD-10-CM | POA: Insufficient documentation

## 2024-04-10 DIAGNOSIS — M153 Secondary multiple arthritis: Secondary | ICD-10-CM | POA: Diagnosis not present

## 2024-04-10 DIAGNOSIS — M059 Rheumatoid arthritis with rheumatoid factor, unspecified: Secondary | ICD-10-CM

## 2024-04-10 DIAGNOSIS — M1611 Unilateral primary osteoarthritis, right hip: Secondary | ICD-10-CM

## 2024-04-10 NOTE — Assessment & Plan Note (Addendum)
°  Chronic polyosteoarthritis with significant lower back and hip pain, exacerbated by hip surgeries and scoliosis. Partial relief with ibuprofen  and acetaminophen . - Continue ibuprofen  and acetaminophen  for pain management. - Ordered x-rays to assess joint damage. Orders:   XR Hand 2 View Right   XR Hand 2 View Left   XR Elbow 2 Views Right   XR Elbow 2 Views Left

## 2024-04-10 NOTE — Patient Instructions (Signed)
 SABRA

## 2024-04-10 NOTE — Assessment & Plan Note (Addendum)
 Chronic pain syndrome Secondary to hip surgeries and RA. Oxycodone  provides significant relief; long-term use uncertain management has been through orthopedics long term may need pain management consultation. Otherwise current regimen includes ibuprofen  and acetaminophen 

## 2024-04-10 NOTE — Assessment & Plan Note (Addendum)
°  Chronic seropositive RA with persistent hand and arm pain, lateral deviation, and MCP joint contracture. Methotrexate and Enbrel ineffective. Leflunomide considered due to lower infection risk vs biologic DMARD. Overall picture appears to be high degree of chronicity, testing today for ongoing disease activity. - Ordered blood tests to evaluate disease activity. - Ordered x-rays of hands and elbows to assess RA damage. - Provided information on leflunomide as a treatment option. - Consider starting leflunomide after reviewing blood test results. - Monitor liver enzymes and blood cell counts if leflunomide is initiated. Orders:   Cyclic citrul peptide antibody, IgG   Sedimentation rate   C-reactive protein

## 2024-04-10 NOTE — Progress Notes (Signed)
 Office Visit Note  Patient: Thomas [name]             Date of Birth: Jul 12, 1950           MRN: [id number]             PCP: Theophilus Pagan, MD Referring: Candance Jeoffrey LOISE DEVONNA Visit Date: 04/10/2024 Occupation: Data Unavailable  Subjective:  New Patient (Initial Visit) (Patient was seeing Dr. Mai.)   Discussed the use of AI scribe software for clinical note transcription with the patient, who gave verbal consent to proceed.  History of Present Illness   Thomas [name] is a 73 year old male here to establish care for seropositive (CCP+) rheumatoid arthritis previously managed with Dr. Mai at [facility].  RA originally diagnosed since 2018 with bilateral hand pain and swelling, decreased mobility, and positive CCP on lab testing. He was treated with hydroxychloroquine ineffectively. Enbrel treatment was stopped after diagnosis and treatment for prostate cancer and further biologics avoided due to b/l foot osteomyelitis and amputations. Methotrexate treatment has not improved his symptoms adequately, and stopped this since earlier this year. He was on prednisone  chronically most recently at 5 mg daily.  He experiences persistent joint pain primarily in his hands and arms, described as constant with a twisting sensation. The pain has been present since his diagnosis of rheumatoid arthritis and is not associated with significant swelling but is exacerbated by movement. He is currently managing pain with oxycodone  regimen started postoperatively for right hip fractures. Also taking ibuprofen  and tylenol , estimates up to 2g daily ibuprofen  use.  He has tried multiple medications for rheumatoid arthritis, including Plaquenil, methotrexate, and Enbrel, none of which provided relief. Enbrel was discontinued following treatment for prostate cancer. He is currently not on any disease-modifying antirheumatic drugs (DMARDs) or steroids.  His past medical history includes two hip  surgeries following falls, resulting in chronic back pain managed with pain medications. He also had a bone infection in his feet leading to multiple amputations, though the cause remains unclear. He denies peripheral vascular disease and reports no neuropathy in his feet but uses grab bars for support due to difficulty standing.  He has experienced significant weight loss, reports dropping from maximum of 265 pounds to 170 pounds, attributed to a lack of appetite and pain. He has recently gained 15 pounds after starting protein shakes. He has scoliosis, attributed to his previous occupation involving heavy lifting, and reports chronic lower back pain, sometimes described as excruciating.  No recent illnesses or significant swelling in his joints. He consumes one beer a day. He has some previous history of higher risk chronic opioid and benzodiazepine use.     DMARD Hx HCQ Methotrexate Enbrel  Activities of Daily Living:  Patient reports morning stiffness for 1 hour.   Patient Reports nocturnal pain.  Difficulty dressing/grooming: Denies Difficulty climbing stairs: Reports Difficulty getting out of chair: Denies Difficulty using hands for taps, buttons, cutlery, and/or writing: Reports  Review of Systems  Constitutional:  Positive for fatigue.  HENT:  Positive for mouth dryness. Negative for mouth sores.   Eyes:  Negative for dryness.  Respiratory:  Negative for shortness of breath.   Cardiovascular:  Negative for chest pain and palpitations.  Gastrointestinal:  Negative for blood in stool, constipation and diarrhea.  Endocrine: Positive for increased urination.  Genitourinary:  Negative for involuntary urination.  Musculoskeletal:  Positive for joint pain, gait problem, joint pain, myalgias, morning stiffness, muscle tenderness and myalgias.  Negative for joint swelling and muscle weakness.  Skin:  Positive for color change. Negative for rash, hair loss and sensitivity to sunlight.   Allergic/Immunologic: Negative for susceptible to infections.  Neurological:  Negative for dizziness and headaches.  Hematological:  Negative for swollen glands.  Psychiatric/Behavioral:  Positive for depressed mood and sleep disturbance. The patient is not nervous/anxious.     PMFS History:  Patient Active Problem List   Diagnosis Date Noted   Hypokalemia 10/30/2022   Common bile duct dilatation 10/29/2022   Altered mental status 10/28/2022   Anemia 10/28/2022   Wound dehiscence, surgical 09/19/2022   Depressed mood 06/20/2022   Dehiscence of amputation stump of left lower extremity (HCC) 03/10/2022   History of transmetatarsal amputation of right foot (HCC) 02/15/2022   Chronic osteomyelitis of toe, right (HCC) 02/10/2022   Inguinal hernia 10/18/2020   Chronic combined systolic and diastolic CHF, NYHA class 2 (HCC)    Osteoporosis 05/05/2020   Foot infection    NICM (nonischemic cardiomyopathy) (HCC), no significant CAD on cardiac cath   02/12/2020   Right foot pain    Rheumatoid arthritis (HCC)    Atrial flutter (HCC) 02/02/2020   Lower extremity edema 12/30/2019   Weight loss, non-intentional 11/24/2019   Chronic right hip pain 06/19/2019   Degenerative tear of acetabular labrum of right hip 05/02/2019   Primary osteoarthritis of right hip 05/02/2019   Skin ulcer (HCC) 01/29/2019   Malignant neoplasm of prostate (HCC) 01/16/2018   Insomnia 10/02/2017   S/P bilateral TKA 09/13/2015   Heme positive stool 05/21/2013   Osteoarthritis, multiple sites 01/13/2011   Hypertension 12/06/2010   CLAUSTROPHOBIA 03/01/2010    Past Medical History:  Diagnosis Date   Anemia    Atrial flutter (HCC) 01/2020   Cancer (HCC)    prostate   COVID 2021   mild case   Dysrhythmia    Aflutter   Heart failure with reduced ejection fraction (HCC)    Pt denies, it is in cardiologist notes.   Heart murmur    Hypertension    Hypoglycemia    occ   NICM (nonischemic cardiomyopathy) (HCC)  02/12/2020   Osteomyelitis of third toe of left foot (HCC) 08/16/2022   Prostate cancer (HCC) 2019   Rheumatoid arthritis (HCC)     Family History  Problem Relation Age of Onset   Prostate cancer Father    Prostate cancer Brother    Colon cancer Neg Hx    Rectal cancer Neg Hx    Stomach cancer Neg Hx    Past Surgical History:  Procedure Laterality Date   AMPUTATION Right 02/10/2022   Procedure: RIGHT TRANSMETATARSAL AMPUTATION;  Surgeon: Harden Jerona GAILS, MD;  Location: [facility] OR;  Service: Orthopedics;  Laterality: Right;   AMPUTATION Right 03/10/2022   Procedure: RIGHT FOOT CHOPART AMPUTATION;  Surgeon: Harden Jerona GAILS, MD;  Location: [facility] OR;  Service: Orthopedics;  Laterality: Right;   AMPUTATION Left 08/16/2022   Procedure: LEFT 3RD TOE AMPUTATION;  Surgeon: Harden Jerona GAILS, MD;  Location: [facility] OR;  Service: Orthopedics;  Laterality: Left;   AMPUTATION Left 09/13/2022   Procedure: LEFT TRANSMETATARSAL AMPUTATION;  Surgeon: Harden Jerona GAILS, MD;  Location: [facility] OR;  Service: Orthopedics;  Laterality: Left;   AMPUTATION TOE Right 03/26/2020   Procedure: Right 3rd toe partial amputation, bone biopsy right 1st metatarsal;  Surgeon: Gretel Ozell PARAS, DPM;  Location: [facility] ORS;  Service: Podiatry;  Laterality: Right;   BUBBLE STUDY  02/11/2020   Procedure: BUBBLE STUDY;  Surgeon: Barbaraann Darryle Ned, MD;  Location: [facility] ENDOSCOPY;  Service: Cardiovascular;;   CARDIOVERSION N/A 02/11/2020   Procedure: CARDIOVERSION;  Surgeon: Barbaraann Darryle Ned, MD;  Location: [facility] ENDOSCOPY;  Service: Cardiovascular;  Laterality: N/A;   CARDIOVERSION N/A 08/31/2021   Procedure: CARDIOVERSION;  Surgeon: Jeffrie Oneil BROCKS, MD;  Location: [facility] ENDOSCOPY;  Service: Cardiovascular;  Laterality: N/A;   CYSTOSCOPY  04/11/2018   Procedure: PHYLLIS SIDE;  Surgeon: Cam Morene ORN, MD;  Location: [facility];  Service: Urology;;  NO SEEDS FOUND IN BLADDER   HERNIA REPAIR  2009   inguinal   INTRAMEDULLARY  (IM) NAIL INTERTROCHANTERIC Right 05/05/2020   Procedure: CPT 27245-Cephalomedullary nailing of right intertrochanteric femur fracture;  Surgeon: Kendal Franky SQUIBB, MD;  Location: [facility] OR;  Service: Orthopedics;  Laterality: Right;   INTRAMEDULLARY (IM) NAIL INTERTROCHANTERIC Right 12/24/2020   Procedure: REVISION FIXATION OF RIGHT INTERTROCHANTERIC FEMUR NONUNION;  Surgeon: Kendal Franky SQUIBB, MD;  Location: [facility] OR;  Service: Orthopedics;  Laterality: Right;   IRRIGATION AND DEBRIDEMENT FOOT Left 03/26/2020   Procedure: Left foot incision and drainage with removal of all non-viable soft tissue and bone - areas overlying the 2nd and 5th metatarsals.;  Surgeon: Gretel Ozell PARAS, DPM;  Location: [facility] ORS;  Service: Podiatry;  Laterality: Left;   RADIOACTIVE SEED IMPLANT N/A 04/11/2018   Procedure: RADIOACTIVE SEED IMPLANT/BRACHYTHERAPY IMPLANT;  Surgeon: Cam Morene ORN, MD;  Location: [facility];  Service: Urology;  Laterality: N/A;   71     SEEDS IMPLANTED   RIGHT/LEFT HEART CATH AND CORONARY ANGIOGRAPHY N/A 02/09/2020   Procedure: RIGHT/LEFT HEART CATH AND CORONARY ANGIOGRAPHY;  Surgeon: Mady Bruckner, MD;  Location: [facility] INVASIVE CV LAB;  Service: Cardiovascular;  Laterality: N/A;   SPACE OAR INSTILLATION N/A 04/11/2018   Procedure: SPACE OAR INSTILLATION;  Surgeon: Cam Morene ORN, MD;  Location: [facility];  Service: Urology;  Laterality: N/A;   STUMP REVISION Left 09/15/2022   Procedure: REVISION LEFT TRANSMETATARSAL AMPUTATION;  Surgeon: Harden Jerona GAILS, MD;  Location: [facility] OR;  Service: Orthopedics;  Laterality: Left;   TEE WITHOUT CARDIOVERSION N/A 02/11/2020   Procedure: TRANSESOPHAGEAL ECHOCARDIOGRAM (TEE);  Surgeon: Barbaraann Darryle Ned, MD;  Location: [facility] ENDOSCOPY;  Service: Cardiovascular;  Laterality: N/A;   TOTAL KNEE ARTHROPLASTY Bilateral 09/13/2015   Procedure: BILATERAL KNEE ARTHROPLASTY ;  Surgeon: Donnice Car, MD;  Location: [facility] ORS;  Service:  Orthopedics;  Laterality: Bilateral;   Social History[1] Social History   Social History Narrative   Not on file     Immunization History  Administered Date(s) Administered   INFLUENZA, HIGH DOSE SEASONAL PF 04/19/2018, 03/02/2019   Influenza Inj Mdck Quad Pf 02/23/2017   Influenza Whole 04/20/2013   Influenza-Unspecified 03/30/2016   PFIZER Comirnaty(Gray Top)Covid-19 Tri-Sucrose Vaccine 10/12/2020   PFIZER(Purple Top)SARS-COV-2 Vaccination 07/23/2019, 08/14/2019, 03/24/2020   Pneumococcal Conjugate-13 03/02/2019   Tdap 05/21/2013, 02/17/2019   Zoster, Live 08/28/2016     Objective: Vital Signs: BP (!) 139/96 (BP Location: Right Arm, Patient Position: Sitting, Cuff Size: Normal)   Pulse (!) 123   Temp 98 F (36.7 C)   Resp 18   Ht 6' 1 (1.854 m)   Wt 189 lb (85.7 kg)   BMI 24.94 kg/m    Physical Exam Eyes:     Conjunctiva/sclera: Conjunctivae normal.  Cardiovascular:     Rate and Rhythm: Regular rhythm. Tachycardia present.  Pulmonary:     Effort: Pulmonary effort is normal.     Breath sounds: Normal breath  sounds.  Lymphadenopathy:     Cervical: No cervical adenopathy.  Skin:    General: Skin is warm and dry.     Comments: Trace pitting b/l edema, tortuous superficial veins, lipodermatosclerosis changes in both lower legs  Neurological:     Mental Status: He is alert.  Psychiatric:        Mood and Affect: Mood normal.      Musculoskeletal Exam:  Neck full ROM Elbows restricted extension by 20 degrees or more, large bony widening, no focal tenderness or palpable effusion Lateral deviation of MCP joints with mild flexion contracture and intraosseous muscle wasting in hand. Wrist ROM restricted. No palpable synovitis. Right 3rd MCP and PIP painful with flexion movement Significant scoliosis present, right lateral hip tenderness to pressure Knees full ROM no swelling, postoperative changes b/l Transmetatarsal amputations b/l   Investigation: No  additional findings.  Imaging: No results found.  Recent Labs: Lab Results  Component Value Date   WBC 5.9 08/13/2023   HGB 10.8 (L) 08/13/2023   PLT 275 08/13/2023   NA 142 02/19/2023   K 4.9 02/19/2023   CL 104 02/19/2023   CO2 26 02/19/2023   GLUCOSE 77 02/19/2023   BUN 14 02/19/2023   CREATININE 0.76 02/19/2023   BILITOT 0.7 10/30/2022   ALKPHOS 55 10/30/2022   AST 24 10/30/2022   ALT 15 10/30/2022   PROT 5.5 (L) 10/30/2022   ALBUMIN  2.6 (L) 10/30/2022   CALCIUM  8.9 02/19/2023   GFRAA 110 05/25/2020    Speciality Comments: No specialty comments available.  Procedures:  No procedures performed Allergies: Cefadroxil , Diltiazem , Diltiazem  hcl, Chlorthalidone , and Voltaren  [diclofenac ]   Assessment / Plan:     Visit Diagnoses:  Assessment & Plan Seropositive rheumatoid arthritis (HCC)  Chronic seropositive RA with persistent hand and arm pain, lateral deviation, and MCP joint contracture. Methotrexate and Enbrel ineffective. Leflunomide considered due to lower infection risk vs biologic DMARD. Overall picture appears to be high degree of chronicity, testing today for ongoing disease activity. - Ordered blood tests to evaluate disease activity. - Ordered x-rays of hands and elbows to assess RA damage. - Provided information on leflunomide as a treatment option. - Consider starting leflunomide after reviewing blood test results. - Monitor liver enzymes and blood cell counts if leflunomide is initiated. Orders:   Cyclic citrul peptide antibody, IgG   Sedimentation rate   C-reactive protein  Other secondary osteoarthritis of multiple sites  Chronic polyosteoarthritis with significant lower back and hip pain, exacerbated by hip surgeries and scoliosis. Partial relief with ibuprofen  and acetaminophen . - Continue ibuprofen  and acetaminophen  for pain management. - Ordered x-rays to assess joint damage. Orders:   XR Hand 2 View Right   XR Hand 2 View Left   XR Elbow 2  Views Right   XR Elbow 2 Views Left  Primary osteoarthritis of right hip Chronic pain syndrome Secondary to hip surgeries and RA. Oxycodone  provides significant relief; long-term use uncertain management has been through orthopedics long term may need pain management consultation. Otherwise current regimen includes ibuprofen  and acetaminophen     High risk medication use Checking baseline labs for leflunomide CBC and CMP. Previous negative hepatitis screening reviewed. Orders:   CBC with Differential/Platelet   Comprehensive metabolic panel with GFR     Follow-Up Instructions: Return in about 4 weeks (around 05/08/2024) for New pt RA LEF start f/u ~15mo.   Lonni LELON Ester, MD  Note - This record has been created using [employer].  Chart creation errors  have been sought, but may not always  have been located. Such creation errors do not reflect on  the standard of medical care.     [1]  Social History Tobacco Use   Smoking status: Never    Passive exposure: Never   Smokeless tobacco: Never  Vaping Use   Vaping status: Never Used  Substance Use Topics   Alcohol use: Yes    Alcohol/week: 7.0 standard drinks of alcohol    Types: 7 Cans of beer per week   Drug use: Not Currently

## 2024-04-10 NOTE — Assessment & Plan Note (Addendum)
 Checking baseline labs for leflunomide CBC and CMP. Previous negative hepatitis screening reviewed. Orders:   CBC with Differential/Platelet   Comprehensive metabolic panel with GFR

## 2024-04-11 NOTE — Progress Notes (Signed)
 Thomas [name]                                          MRN: [id number]   04/11/2024   The [facility] Quality Team Specialist reviewed this patient medical record for the purposes of chart review for care gap closure. The following were reviewed: chart review for care gap closure-controlling blood pressure.    [facility]BCI Quality Team

## 2024-04-12 LAB — C-REACTIVE PROTEIN: CRP: 8.6 mg/L — ABNORMAL HIGH

## 2024-04-12 LAB — CBC WITH DIFFERENTIAL/PLATELET
Absolute Lymphocytes: 718 {cells}/uL — ABNORMAL LOW (ref 850–3900)
Absolute Monocytes: 740 {cells}/uL (ref 200–950)
Basophils Absolute: 22 {cells}/uL (ref 0–200)
Basophils Relative: 0.3 %
Eosinophils Absolute: 170 {cells}/uL (ref 15–500)
Eosinophils Relative: 2.3 %
HCT: 38.6 % — ABNORMAL LOW (ref 39.4–51.1)
Hemoglobin: 11.1 g/dL — ABNORMAL LOW (ref 13.2–17.1)
MCH: 22.2 pg — ABNORMAL LOW (ref 27.0–33.0)
MCHC: 28.8 g/dL — ABNORMAL LOW (ref 31.6–35.4)
MCV: 77.2 fL — ABNORMAL LOW (ref 81.4–101.7)
MPV: 11.3 fL (ref 7.5–12.5)
Monocytes Relative: 10 %
Neutro Abs: 5750 {cells}/uL (ref 1500–7800)
Neutrophils Relative %: 77.7 %
Platelets: 347 Thousand/uL (ref 140–400)
RBC: 5 Million/uL (ref 4.20–5.80)
RDW: 14.9 % (ref 11.0–15.0)
Total Lymphocyte: 9.7 %
WBC: 7.4 Thousand/uL (ref 3.8–10.8)

## 2024-04-12 LAB — COMPREHENSIVE METABOLIC PANEL WITH GFR
AG Ratio: 1.6 (calc) (ref 1.0–2.5)
ALT: 16 U/L (ref 9–46)
AST: 24 U/L (ref 10–35)
Albumin: 4.1 g/dL (ref 3.6–5.1)
Alkaline phosphatase (APISO): 83 U/L (ref 35–144)
BUN: 17 mg/dL (ref 7–25)
CO2: 29 mmol/L (ref 20–32)
Calcium: 8.9 mg/dL (ref 8.6–10.3)
Chloride: 105 mmol/L (ref 98–110)
Creat: 0.81 mg/dL (ref 0.70–1.28)
Globulin: 2.6 g/dL (ref 1.9–3.7)
Glucose, Bld: 87 mg/dL (ref 65–99)
Potassium: 4.4 mmol/L (ref 3.5–5.3)
Sodium: 141 mmol/L (ref 135–146)
Total Bilirubin: 0.5 mg/dL (ref 0.2–1.2)
Total Protein: 6.7 g/dL (ref 6.1–8.1)
eGFR: 94 mL/min/1.73m2

## 2024-04-12 LAB — SEDIMENTATION RATE: Sed Rate: 9 mm/h (ref 0–20)

## 2024-04-12 LAB — CYCLIC CITRUL PEPTIDE ANTIBODY, IGG: Cyclic Citrullin Peptide Ab: 18 U

## 2024-04-21 ENCOUNTER — Other Ambulatory Visit: Payer: Self-pay | Admitting: Cardiovascular Disease

## 2024-04-21 ENCOUNTER — Telehealth: Payer: Self-pay | Admitting: Student

## 2024-04-21 DIAGNOSIS — I4892 Unspecified atrial flutter: Secondary | ICD-10-CM

## 2024-04-21 MED ORDER — APIXABAN 5 MG PO TABS: ORAL_TABLET | ORAL | 3 refills | Status: DC

## 2024-04-21 NOTE — Telephone Encounter (Signed)
 Contacted the [facility] that the patient requested his Eliquis  refills be sent to.  They inform me that they had the refills I had sent in for the patient.  SignedMorse Clause, PA-C 04/21/2024, 8:38 PM

## 2024-04-21 NOTE — Progress Notes (Signed)
 Patient called the after-hours answering service reporting that he is still waiting on refills for his Eliquis .  Verified and this is the pharmacy that the patient wants his refill sent to.  The patient has a follow-up appointment on 04/23/2024.  Signed,  Morse Clause, PA-C 04/21/2024, 7:19 PM

## 2024-04-21 NOTE — Telephone Encounter (Signed)
" °*  STAT* If patient is at the pharmacy, call can be transferred to refill team.   1. Which medications need to be refilled? (please list name of each medication and dose if known)   apixaban  (ELIQUIS ) 5 MG TABS tablet     2. Would you like to learn more about the convenience, safety, & potential cost savings by using the [facility]? No   3. Are you open to using the [facility] (Type [facility]. ) No   4. Which pharmacy/location (including street and city if local pharmacy) is medication to be sent to?  [id number] [place], [place] - [address]   5. Do they need a 30 day or 90 day supply? 90 day  Pt has scheduled appt on 12/31 and is pt is out of medication  "

## 2024-04-21 NOTE — Progress Notes (Deleted)
 "  Cardiology Office Note    Date:  04/21/2024  ID:  Thomas [name] [name], DOB Aug 03, 1950, MRN [id number] PCP:  Theophilus Pagan, MD  Cardiologist:  Aleene Passe, MD (Inactive)  Electrophysiologist:  None   Chief Complaint: ***  History of Present Illness: .    Thomas [name] is a 73 y.o. male with visit-pertinent history of HFrEF, NICM, atrial flutter s/p DCCV in 01/2020, hypertension, nonobstructive CAD, osteomyelitis s/p multiple toe amputations, prostate cancer.  Patient was initially seen by Dr. Hobart in 2021 by referral from his PCP for evaluation of atrial flutter and worsening lower extremity edema.  He was referred to the ED and was admitted to [facility] for further management.  He was diagnosed with atrial fibrillation with RVR, patient was admitted on Lasix  drip and underwent right/LHC with no significant coronary disease and pulmonary artery pressure.  Echo indicated LVEF less than 20% with global hypokinesis, biatrial enlargement, mild MR and moderate bilateral pleural effusions.  RV mildly enlarged but systolic function was normal.  DCCV on 02/11/2020 was successful however following had hypotension and was placed on Levophed  drip postprocedure.  He was started on amiodarone  and Eliquis  5 mg twice daily prior to discharge.  At follow-up visit in 07/2021 he was back in atrial flutter and underwent DCCV on 08/23/2021 with return to sinus rhythm.  He was previously evaluated by Dr. Waddell for consideration of ablation however elected to think further about the procedure.  Echo in 2023 indicated EF 55 to 60% and grade 1 DD with mild aortic dilation.  Patient was last seen in clinic by Dr. Passe on 02/19/2023.  Patient had been admitted in July 2024 for altered mental status, was found to have lactic acidosis resolved with 1 L normal saline.  Brain MRI was negative.  UDS was positive for benzodiazepines and opioids both of which were prescribed him in the past.  Patient reported he  been off of all of his medications except for oxycodone  since July.  Patient was started on losartan  25 mg daily, he was restarted on Eliquis .  Today he presents for overdue follow-up.  He reports that he   Non ischemic cardiomyopathy/HF with improved EF:  TTE with LVEF less than 20% in the setting of a flutter with RVR with suspected tachycardia induced CM.  Patient with clean coronaries on cath.  Repeat TTE in 2022 and 2023 with LVEF 55 to 60%, G1 DD. Today he reports He appears euvolemic and well compensated on exam.  Aflutter/fib: S/p TEE with successful DCCV on 02/11/2020 complicated by hypotension which resolved the following day.  Developed recurrent A-flutter in 07/2021 with successful DCCV in 08/2021. Patient denies any palpitations or feeling of atrial fibrillation. EKG today indicates  Hypertension: Blood pressure today  Hyperlipidemia: Last lipid profile indicated   Labwork independently reviewed:   ROS: .   *** denies chest pain, shortness of breath, lower extremity edema, fatigue, palpitations, melena, hematuria, hemoptysis, diaphoresis, weakness, presyncope, syncope, orthopnea, and PND.  All other systems are reviewed and otherwise negative.  Studies Reviewed: SABRA    EKG:  EKG is ordered today, personally reviewed, demonstrating ***     CV Studies: Cardiac studies reviewed are outlined and summarized above. Otherwise please see EMR for full report. Cardiac Studies & Procedures   ______________________________________________________________________________________________ CARDIAC CATHETERIZATION  CARDIAC CATHETERIZATION 02/09/2020  Conclusion Conclusions: 1. No angiographically significant coronary artery disease. 2. Upper normal left heart, right heart, and pulmonary artery pressures. 3. Mildly reduced Fick  cardiac output/index.  Recommendations: 1. Restart heparin  infusion 2 hours after TR band removal.  If there is no evidence of bleeding/vascular  complication, consider transitioning back to apixaban  as soon as tomorrow. 2. Proceed with TEE/cardioversion as soon as tomorrow.  I will make Mr. Eick NPO after midnight in anticipation of this. 3. Optimize evidence-based heart failure therapy for non-ischemic cardiomyopathy. 4. Primary prevention of coronary artery disease.  Lonni Hanson, MD [facility]  Findings Coronary Findings Diagnostic  Dominance: Right  Left Main Vessel is large. Vessel is angiographically normal.  Left Anterior Descending Vessel is large. The vessel exhibits minimal luminal irregularities.  First Diagonal Branch Vessel is large in size.  Left Circumflex Vessel is large. Vessel is angiographically normal.  First Obtuse Marginal Branch Vessel is moderate in size.  Second Obtuse Marginal Branch Vessel is moderate in size.  Third Obtuse Marginal Branch Vessel is small in size.  Right Coronary Artery Vessel is moderate in size. Vessel is angiographically normal.  Right Posterior Descending Artery Vessel is small in size.  Right Posterior Atrioventricular Artery Vessel is moderate in size.  Intervention  No interventions have been documented.     ECHOCARDIOGRAM  ECHOCARDIOGRAM LIMITED 11/10/2021  Narrative ECHOCARDIOGRAM LIMITED REPORT    Patient Name:   Thomas [name] Date of Exam: 11/10/2021 Medical Rec #:  [id number]       Height:       80.0 in Accession #:    [id number]      Weight:       225.6 lb Date of Birth:  [birth year]       BSA:          2.420 m Patient Age:    70 years        BP:           139/91 mmHg Patient Gender: M               HR:           69 bpm. Exam Location:  [address]  Procedure: 3D Echo, Limited Echo, Cardiac Doppler and Limited Color Doppler  Indications:    I42.8 Nonischemic Cardiomyopathy  History:        Patient has prior history of Echocardiogram examinations, most recent 08/18/2021. Arrythmias:Atrial Flutter;  Risk Factors:Hypertension.  Sonographer:    Waldo Guadalajara RCS Referring Phys: [id number] MICHELE M LENZE  IMPRESSIONS   1. Limited echo for LV function 2. Left ventricular ejection fraction, by estimation, is 55 to 60%. Left ventricular ejection fraction by 3D volume is 56 %. The left ventricle has normal function. The left ventricle has no regional wall motion abnormalities. There is mild left ventricular hypertrophy. Left ventricular diastolic parameters are consistent with Grade I diastolic dysfunction (impaired relaxation). 3. The aortic valve is tricuspid. Aortic valve regurgitation is not visualized. 4. Aortic dilatation noted. There is mild dilatation of the aortic root, measuring 41 mm.  Comparison(s): Changes from prior study are noted. 08/18/2021: LVEF 30-35%.  FINDINGS Left Ventricle: Left ventricular ejection fraction, by estimation, is 55 to 60%. Left ventricular ejection fraction by 3D volume is 56 %. The left ventricle has normal function. The left ventricle has no regional wall motion abnormalities. The left ventricular internal cavity size was normal in size. There is mild left ventricular hypertrophy. Left ventricular diastolic parameters are consistent with Grade I diastolic dysfunction (impaired relaxation). Indeterminate filling pressures.  Aortic Valve: The aortic valve is tricuspid. Aortic valve regurgitation is not visualized.  Pulmonic Valve: The  pulmonic valve was not well visualized. Pulmonic valve regurgitation is mild.  Aorta: Aortic dilatation noted. There is mild dilatation of the aortic root, measuring 41 mm.  LEFT VENTRICLE PLAX 2D LVIDd:         4.50 cm         Diastology LVIDs:         2.90 cm         LV e' medial:    6.96 cm/s LV PW:         1.10 cm         LV E/e' medial:  8.8 LV IVS:        1.10 cm         LV e' lateral:   6.20 cm/s LVOT diam:     2.10 cm         LV E/e' lateral: 9.9 LVOT Area:     3.46 cm  3D Volume EF LV 3D EF:     Left ventricul ar ejection fraction by 3D volume is 56 %.  3D Volume EF: 3D EF:        56 % LV EDV:       167 ml LV ESV:       74 ml LV SV:        94 ml  LEFT ATRIUM         Index LA diam:    5.00 cm 2.07 cm/m  AORTA Ao Root diam: 4.10 cm Ao Asc diam:  3.40 cm  MITRAL VALVE MV Area (PHT):             SHUNTS MV Decel Time:             Systemic Diam: 2.10 cm MV E velocity: 61.30 cm/s MV A velocity: 73.40 cm/s MV E/A ratio:  0.84  Vinie Maxcy MD Electronically signed by Vinie Maxcy MD Signature Date/Time: 11/10/2021/3:40:08 PM    Final   TEE  ECHO TEE 02/11/2020  Narrative TRANSESOPHOGEAL ECHO REPORT    Patient Name:   Thomas LOISE REEDY Date of Exam: 02/11/2020 Medical Rec #:  [id number]       Height:       80.0 in Accession #:    [id number]      Weight:       185.8 lb Date of Birth:  [birth year]       BSA:          2.229 m Patient Age:    69 years        BP:           106/79 mmHg Patient Gender: M               HR:           114 bpm. Exam Location:  Inpatient  Procedure: 3D Echo, Transesophageal Echo, Cardiac Doppler and Color Doppler  Indications:     I48.92* Unspecified atrial flutter  History:         Patient has prior history of Echocardiogram examinations, most recent 02/02/2020. Abnormal ECG, Arrythmias:Atrial Flutter, Signs/Symptoms:Dyspnea; Risk Factors:Hypertension. Cancer. Edema.  Sonographer:     Ellouise Mose RDCS Referring Phys:  [id number] POWELL BRAVO [place] Diagnosing Phys: Darryle Decent MD  PROCEDURE: After discussion of the risks and benefits of a TEE, an informed consent was obtained from the patient. TEE procedure time was 18 minutes. The transesophogeal probe was passed without difficulty through the esophogus of the patient. Imaged were obtained with the patient in a left  lateral decubitus position. Local oropharyngeal anesthetic was provided with Cetacaine . Sedation performed by different physician. The patient was monitored while  under deep sedation. Anesthestetic sedation was provided intravenously by Anesthesiology: 260mg  of Propofol , 40mg  of Lidocaine . The patient's vital signs; including heart rate, blood pressure, and oxygen saturation; remained stable throughout the procedure. The patient developed no complications during the procedure. A successful direct current cardioversion was performed at 120 joules with 1 attempt.  IMPRESSIONS   1. Severely reduced LV function, LVEF~10-15%. LVOT VTI ~5.5 cm. Left ventricular ejection fraction, by estimation, is 10-15%. The left ventricle has severely decreased function. The left ventricle demonstrates global hypokinesis. The left ventricular internal cavity size was moderately to severely dilated. 2. Evidence of atrial level shunting detected by color flow Doppler. Agitated saline contrast bubble study was positive with shunting observed within 3-6 cardiac cycles suggestive of interatrial shunt. There is a small patent foramen ovale with predominantly left to right shunting across the atrial septum. 3. Right ventricular systolic function is severely reduced. The right ventricular size is severely enlarged. 4. Left atrial size was mild to moderately dilated. No left atrial/left atrial appendage thrombus was detected. The LAA emptying velocity was 21 cm/s. 5. Right atrial size was mildly dilated. 6. The mitral valve is grossly normal. Mild mitral valve regurgitation. No evidence of mitral stenosis. 7. Tricuspid valve regurgitation is mild to moderate. 8. The aortic valve is tricuspid. Aortic valve regurgitation is trivial. No aortic stenosis is present.  FINDINGS Left Ventricle: Severely reduced LV function, LVEF~10-15%. LVOT VTI ~5.5 cm. Left ventricular ejection fraction, by estimation, is 10-15%. The left ventricle has severely decreased function. The left ventricle demonstrates global hypokinesis. The left ventricular internal cavity size was moderately to severely  dilated.  Right Ventricle: The right ventricular size is severely enlarged. No increase in right ventricular wall thickness. Right ventricular systolic function is severely reduced.  Left Atrium: Left atrial size was mild to moderately dilated. No left atrial/left atrial appendage thrombus was detected. The LAA emptying velocity was 21 cm/s.  Right Atrium: Right atrial size was mildly dilated.  Pericardium: There is no evidence of pericardial effusion.  Mitral Valve: The mitral valve is grossly normal. Mild mitral valve regurgitation. No evidence of mitral valve stenosis.  Tricuspid Valve: The tricuspid valve is grossly normal. Tricuspid valve regurgitation is mild to moderate. No evidence of tricuspid stenosis.  Aortic Valve: The aortic valve is tricuspid. Aortic valve regurgitation is trivial. No aortic stenosis is present.  Pulmonic Valve: The pulmonic valve was grossly normal. Pulmonic valve regurgitation is trivial. No evidence of pulmonic stenosis.  Aorta: The aortic root and ascending aorta are structurally normal, with no evidence of dilitation. There is minimal (Grade I) layered plaque involving the descending aorta.  Venous: The left upper pulmonary vein is normal.  IAS/Shunts: Evidence of atrial level shunting detected by color flow Doppler. Agitated saline contrast was given intravenously to evaluate for intracardiac shunting. Agitated saline contrast bubble study was positive with shunting observed within 3-6 cardiac cycles suggestive of interatrial shunt. A small patent foramen ovale is detected with predominantly left to right shunting across the atrial septum.   LEFT VENTRICLE PLAX 2D LVOT diam:     2.30 cm LV SV:         23 LV SV Index:   10 LVOT Area:     4.15 cm   AORTIC VALVE LVOT Vmax:   44.88 cm/s LVOT Vmean:  32.166 cm/s LVOT VTI:    0.056 m  AORTA Ao Root diam: 3.90 cm Ao Asc diam:  3.30 cm  TRICUSPID VALVE TR Peak grad:   24.6 mmHg TR Vmax:         248.00 cm/s  SHUNTS Systemic VTI:  0.06 m Systemic Diam: 2.30 cm  Darryle Decent MD Electronically signed by Darryle Decent MD Signature Date/Time: 02/11/2020/6:55:02 PM    Final        ______________________________________________________________________________________________       Current Reported Medications:.    Active Medications[1]  Physical Exam:    VS:  There were no vitals taken for this visit.   Wt Readings from Last 3 Encounters:  04/10/24 189 lb (85.7 kg)  03/19/24 198 lb 2 oz (89.9 kg)  02/12/24 186 lb 3.2 oz (84.5 kg)    GEN: Well nourished, well developed in no acute distress NECK: No JVD; No carotid bruits CARDIAC: ***RRR, no murmurs, rubs, gallops RESPIRATORY:  Clear to auscultation without rales, wheezing or rhonchi  ABDOMEN: Soft, non-tender, non-distended EXTREMITIES:  No edema; No acute deformity     Asessement and Plan:.     ***     Disposition: F/u with ***  Signed, Hayes Rehfeldt D Bonnye Halle, NP       [1]  No outpatient medications have been marked as taking for the 04/23/24 encounter (Appointment) with Dehaven Sine D, NP.   "

## 2024-04-22 ENCOUNTER — Other Ambulatory Visit (HOSPITAL_COMMUNITY): Payer: Self-pay

## 2024-04-22 MED ORDER — APIXABAN 5 MG PO TABS
5.0000 mg | ORAL_TABLET | Freq: Two times a day (BID) | ORAL | 1 refills | Status: AC
  Filled 2024-04-22: qty 90, fill #0

## 2024-04-23 ENCOUNTER — Ambulatory Visit: Admitting: Cardiology

## 2024-04-23 DIAGNOSIS — I10 Essential (primary) hypertension: Secondary | ICD-10-CM

## 2024-04-23 DIAGNOSIS — I4892 Unspecified atrial flutter: Secondary | ICD-10-CM

## 2024-04-23 DIAGNOSIS — I5042 Chronic combined systolic (congestive) and diastolic (congestive) heart failure: Secondary | ICD-10-CM

## 2024-04-23 DIAGNOSIS — I428 Other cardiomyopathies: Secondary | ICD-10-CM

## 2024-04-23 DIAGNOSIS — D6869 Other thrombophilia: Secondary | ICD-10-CM

## 2024-05-11 NOTE — Progress Notes (Unsigned)
 "  Office Visit Note  Patient: Thomas [name]             Date of Birth: March 02, 1951           MRN: [id number]             Visit Date: 05/12/2024  PCP: Theophilus Pagan, MD   Subjective:   Chief Complaint: No chief complaint on file.  History of Present Illness: Thomas [name] is a 74 y.o. male with PMH of *** who returns today for follow up of seropositive rheumatoid arthritis. He is feeling ***.   Otherwise, patient has *** had any other significant health changes since last visit.   Previous Medication Trials: hydroxychlorquine, methotrexate, Enbrel  Last Labs: 04/10/2024 - Hgb 11.1, CRP 8.6, CCP and Sed rate WNL, CMP WNL  Review of Systems: No Rheumatology ROS completed.   Medication List: Current Medications[1]   Allergies:  Cefadroxil , Diltiazem , Diltiazem  hcl, Chlorthalidone , and Voltaren  [diclofenac ]  Immunization status:  Immunization History  Administered Date(s) Administered   INFLUENZA, HIGH DOSE SEASONAL PF 04/19/2018, 03/02/2019   Influenza Inj Mdck Quad Pf 02/23/2017   Influenza Whole 04/20/2013   Influenza-Unspecified 03/30/2016   PFIZER Comirnaty(Gray Top)Covid-19 Tri-Sucrose Vaccine 10/12/2020   PFIZER(Purple Top)SARS-COV-2 Vaccination 07/23/2019, 08/14/2019, 03/24/2020   Pneumococcal Conjugate-13 03/02/2019   Tdap 05/21/2013, 02/17/2019   Zoster, Live 08/28/2016     Problem List:  Patient Active Problem List   Diagnosis Date Noted   High risk medication use 04/10/2024   Hypokalemia 10/30/2022   Common bile duct dilatation 10/29/2022   Altered mental status 10/28/2022   Anemia 10/28/2022   Wound dehiscence, surgical 09/19/2022   Depressed mood 06/20/2022   Dehiscence of amputation stump of left lower extremity (HCC) 03/10/2022   History of transmetatarsal amputation of right foot (HCC) 02/15/2022   Chronic osteomyelitis of toe, right (HCC) 02/10/2022   Inguinal hernia 10/18/2020   Chronic combined systolic and diastolic CHF, NYHA  class 2 (HCC)    Osteoporosis 05/05/2020   Foot infection    NICM (nonischemic cardiomyopathy) (HCC), no significant CAD on cardiac cath   02/12/2020   Right foot pain    Seropositive rheumatoid arthritis (HCC)    Atrial flutter (HCC) 02/02/2020   Lower extremity edema 12/30/2019   Weight loss, non-intentional 11/24/2019   Chronic right hip pain 06/19/2019   Degenerative tear of acetabular labrum of right hip 05/02/2019   Primary osteoarthritis of right hip 05/02/2019   Skin ulcer (HCC) 01/29/2019   Malignant neoplasm of prostate (HCC) 01/16/2018   Insomnia 10/02/2017   S/P bilateral TKA 09/13/2015   Heme positive stool 05/21/2013   Osteoarthritis, multiple sites 01/13/2011   Hypertension 12/06/2010   CLAUSTROPHOBIA 03/01/2010    History: Past Medical History:  Diagnosis Date   Anemia    Atrial flutter (HCC) 01/2020   Cancer (HCC)    prostate   COVID 2021   mild case   Dysrhythmia    Aflutter   Heart failure with reduced ejection fraction (HCC)    Pt denies, it is in cardiologist notes.   Heart murmur    Hypertension    Hypoglycemia    occ   NICM (nonischemic cardiomyopathy) (HCC) 02/12/2020   Osteomyelitis of third toe of left foot (HCC) 08/16/2022   Prostate cancer (HCC) 2019   Rheumatoid arthritis (HCC)     Family History  Problem Relation Age of Onset   Prostate cancer Father    Prostate cancer Brother    Colon  cancer Neg Hx    Rectal cancer Neg Hx    Stomach cancer Neg Hx    Past Surgical History:  Procedure Laterality Date   AMPUTATION Right 02/10/2022   Procedure: RIGHT TRANSMETATARSAL AMPUTATION;  Surgeon: Harden Jerona GAILS, MD;  Location: [facility] OR;  Service: Orthopedics;  Laterality: Right;   AMPUTATION Right 03/10/2022   Procedure: RIGHT FOOT CHOPART AMPUTATION;  Surgeon: Harden Jerona GAILS, MD;  Location: [facility] OR;  Service: Orthopedics;  Laterality: Right;   AMPUTATION Left 08/16/2022   Procedure: LEFT 3RD TOE AMPUTATION;  Surgeon: Harden Jerona GAILS, MD;   Location: [facility] OR;  Service: Orthopedics;  Laterality: Left;   AMPUTATION Left 09/13/2022   Procedure: LEFT TRANSMETATARSAL AMPUTATION;  Surgeon: Harden Jerona GAILS, MD;  Location: [facility] OR;  Service: Orthopedics;  Laterality: Left;   AMPUTATION TOE Right 03/26/2020   Procedure: Right 3rd toe partial amputation, bone biopsy right 1st metatarsal;  Surgeon: Gretel Ozell PARAS, DPM;  Location: [facility] ORS;  Service: Podiatry;  Laterality: Right;   BUBBLE STUDY  02/11/2020   Procedure: BUBBLE STUDY;  Surgeon: Barbaraann Darryle Ned, MD;  Location: [facility] ENDOSCOPY;  Service: Cardiovascular;;   CARDIOVERSION N/A 02/11/2020   Procedure: CARDIOVERSION;  Surgeon: Barbaraann Darryle Ned, MD;  Location: [facility] ENDOSCOPY;  Service: Cardiovascular;  Laterality: N/A;   CARDIOVERSION N/A 08/31/2021   Procedure: CARDIOVERSION;  Surgeon: Jeffrie Oneil BROCKS, MD;  Location: [facility] ENDOSCOPY;  Service: Cardiovascular;  Laterality: N/A;   CYSTOSCOPY  04/11/2018   Procedure: PHYLLIS SIDE;  Surgeon: Cam Morene ORN, MD;  Location: [facility];  Service: Urology;;  NO SEEDS FOUND IN BLADDER   HERNIA REPAIR  2009   inguinal   INTRAMEDULLARY (IM) NAIL INTERTROCHANTERIC Right 05/05/2020   Procedure: CPT 27245-Cephalomedullary nailing of right intertrochanteric femur fracture;  Surgeon: Kendal Franky SQUIBB, MD;  Location: [facility] OR;  Service: Orthopedics;  Laterality: Right;   INTRAMEDULLARY (IM) NAIL INTERTROCHANTERIC Right 12/24/2020   Procedure: REVISION FIXATION OF RIGHT INTERTROCHANTERIC FEMUR NONUNION;  Surgeon: Kendal Franky SQUIBB, MD;  Location: [facility] OR;  Service: Orthopedics;  Laterality: Right;   IRRIGATION AND DEBRIDEMENT FOOT Left 03/26/2020   Procedure: Left foot incision and drainage with removal of all non-viable soft tissue and bone - areas overlying the 2nd and 5th metatarsals.;  Surgeon: Gretel Ozell PARAS, DPM;  Location: [facility] ORS;  Service: Podiatry;  Laterality: Left;   RADIOACTIVE SEED IMPLANT N/A 04/11/2018   Procedure:  RADIOACTIVE SEED IMPLANT/BRACHYTHERAPY IMPLANT;  Surgeon: Cam Morene ORN, MD;  Location: [name];  Service: Urology;  Laterality: N/A;   71     SEEDS IMPLANTED   RIGHT/LEFT HEART CATH AND CORONARY ANGIOGRAPHY N/A 02/09/2020   Procedure: RIGHT/LEFT HEART CATH AND CORONARY ANGIOGRAPHY;  Surgeon: Mady Bruckner, MD;  Location: [facility] INVASIVE CV LAB;  Service: Cardiovascular;  Laterality: N/A;   SPACE OAR INSTILLATION N/A 04/11/2018   Procedure: SPACE OAR INSTILLATION;  Surgeon: Cam Morene ORN, MD;  Location: [facility];  Service: Urology;  Laterality: N/A;   STUMP REVISION Left 09/15/2022   Procedure: REVISION LEFT TRANSMETATARSAL AMPUTATION;  Surgeon: Harden Jerona GAILS, MD;  Location: [facility] OR;  Service: Orthopedics;  Laterality: Left;   TEE WITHOUT CARDIOVERSION N/A 02/11/2020   Procedure: TRANSESOPHAGEAL ECHOCARDIOGRAM (TEE);  Surgeon: Barbaraann Darryle Ned, MD;  Location: [facility] ENDOSCOPY;  Service: Cardiovascular;  Laterality: N/A;   TOTAL KNEE ARTHROPLASTY Bilateral 09/13/2015   Procedure: BILATERAL KNEE ARTHROPLASTY ;  Surgeon: Donnice Car, MD;  Location: [facility] ORS;  Service: Orthopedics;  Laterality:  Bilateral;   Social History   Social History Narrative   Not on file    Objective: Vital Signs: There were no vitals taken for this visit.  Physical Exam   Imaging: No results found.   Assessment & Plan Seropositive rheumatoid arthritis (HCC)  -Start leflunomide  ***    High risk medication use     Other secondary osteoarthritis of multiple sites      Follow-Up Instructions:  No follow-ups on file.    Procedures: No procedures performed  Daved GORMAN Holstein, PA-C  Note - This record has been created using [employer]. Chart creation errors have been sought, but may not always have been located. Such creation errors do not reflect on the standard of medical care.     [1]  Current Outpatient Medications:    apixaban  (ELIQUIS ) 5 MG  TABS tablet, Take 1 tablet (5 mg total) by mouth 2 (two) times daily., Disp: 90 tablet, Rfl: 1   DULoxetine  (CYMBALTA ) 60 MG capsule, TAKE 1 CAPSULE BY MOUTH EVERY DAY - (Patient not taking: Reported on 04/10/2024), Disp: 90 capsule, Rfl: 1   folic acid (FOLVITE) 1 MG tablet, TAKE 1 TABLET BY MOUTH EVERY DAY FOR 90 DAYS, Disp: , Rfl:    losartan  (COZAAR ) 25 MG tablet, Take 1 tablet (25 mg total) by mouth daily. (Patient not taking: Reported on 04/10/2024), Disp: 90 tablet, Rfl: 3   metoprolol  tartrate (LOPRESSOR ) 25 MG tablet, TAKE 0.5 TABLETS BY MOUTH 2 TIMES DAILY., Disp: 90 tablet, Rfl: 1   mupirocin  ointment (BACTROBAN ) 2 %, Apply 1 Application topically 2 (two) times daily. (Patient not taking: Reported on 04/10/2024), Disp: 22 g, Rfl: 0   ondansetron  (ZOFRAN -ODT) 4 MG disintegrating tablet, PLACE 2 TABLETS TWICE A DAY BY TRANSLINGUAL ROUTE, FOR NAUSEA. (Patient not taking: Reported on 04/10/2024), Disp: , Rfl:    oxycodone  20 MG TABS, Take 1 tablet (20 mg total) by mouth every 6 (six) hours as needed for pain., Disp: , Rfl:    silver  sulfADIAZINE  (SILVADENE ) 1 % cream, Apply topically daily. (Patient not taking: Reported on 04/10/2024), Disp: , Rfl:    tamsulosin  (FLOMAX ) 0.4 MG CAPS capsule, Take 0.4 mg by mouth daily as needed (prostate spasms). (Patient taking differently: Take 0.4 mg by mouth 2 (two) times daily.), Disp: , Rfl:    testosterone  cypionate (DEPOTESTOSTERONE CYPIONATE) 200 MG/ML injection, Inject 100 mg into the muscle once a week. (Patient not taking: Reported on 04/10/2024), Disp: , Rfl:    tiZANidine  (ZANAFLEX ) 4 MG tablet, Take 1 tablet (4 mg total) by mouth at bedtime as needed for muscle spasms. (Patient not taking: Reported on 04/10/2024), Disp: 30 tablet, Rfl: 2  "

## 2024-05-11 NOTE — Assessment & Plan Note (Signed)
 SABRA

## 2024-05-11 NOTE — Assessment & Plan Note (Signed)
 Chronic seropositive rheumatoid arthritis with previously ineffective treatment on methotrexate and Enbrel. At his last appointment, we discussed starting him on leflunomide , but prescription was never sent to pharmacy. We will initiate treatment today. CBC/LFTs were WNL at last appointment. CRP was slightly elevated. Risks, benefits, and alternatives to Arava  discussed today and patient expressed understanding. Discussed potential side effects, including nausea, diarrhea, and elevated LFTs. Patient counseled about the importance of regular bloodwork monitoring and alcohol avoidance.  -Start leflunomide  20 mg daily. Take with food. -Will recheck labs for drug monitoring at his next appointment in 8 weeks.

## 2024-05-12 ENCOUNTER — Ambulatory Visit

## 2024-05-12 VITALS — BP 126/89 | HR 120 | Temp 97.6°F | Resp 16 | Ht 73.0 in | Wt 195.4 lb

## 2024-05-12 DIAGNOSIS — D509 Iron deficiency anemia, unspecified: Secondary | ICD-10-CM

## 2024-05-12 DIAGNOSIS — M059 Rheumatoid arthritis with rheumatoid factor, unspecified: Secondary | ICD-10-CM | POA: Diagnosis not present

## 2024-05-12 DIAGNOSIS — Z79899 Other long term (current) drug therapy: Secondary | ICD-10-CM | POA: Diagnosis not present

## 2024-05-12 DIAGNOSIS — M153 Secondary multiple arthritis: Secondary | ICD-10-CM

## 2024-05-12 MED ORDER — LEFLUNOMIDE 20 MG PO TABS: 20.0000 mg | ORAL_TABLET | Freq: Every day | ORAL | 2 refills | Status: AC

## 2024-05-12 NOTE — Assessment & Plan Note (Signed)
 Last hgb 11.1, chronically low. Patient has taken iron supplements in the past but stopped due to having too many pills to take. Advised him to retry iron supplements starting every 2 days and slowly increasing up to daily as tolerated. May help mitigate any diarrhea as potential side effect of leflunomide . -Start ferrous sulfate  325 mg every 2 days.

## 2024-05-30 ENCOUNTER — Ambulatory Visit: Admitting: Cardiology

## 2024-06-18 ENCOUNTER — Ambulatory Visit: Admitting: Cardiology

## 2024-07-07 ENCOUNTER — Ambulatory Visit: Admitting: Internal Medicine

## 2024-10-02 ENCOUNTER — Encounter
